# Patient Record
Sex: Female | Born: 1955 | Race: Black or African American | Hispanic: No | Marital: Married | Smoking: Former smoker
Health system: Southern US, Community
[De-identification: ages and names within clinical notes are randomized; demographics above are authoritative.]

## PROBLEM LIST (undated history)

## (undated) DIAGNOSIS — K7460 Unspecified cirrhosis of liver: Secondary | ICD-10-CM

## (undated) DIAGNOSIS — C801 Malignant (primary) neoplasm, unspecified: Secondary | ICD-10-CM

## (undated) DIAGNOSIS — E669 Obesity, unspecified: Secondary | ICD-10-CM

## (undated) DIAGNOSIS — K759 Inflammatory liver disease, unspecified: Secondary | ICD-10-CM

## (undated) DIAGNOSIS — I10 Essential (primary) hypertension: Secondary | ICD-10-CM

## (undated) DIAGNOSIS — K828 Other specified diseases of gallbladder: Secondary | ICD-10-CM

## (undated) DIAGNOSIS — K5790 Diverticulosis of intestine, part unspecified, without perforation or abscess without bleeding: Secondary | ICD-10-CM

## (undated) DIAGNOSIS — B1920 Unspecified viral hepatitis C without hepatic coma: Secondary | ICD-10-CM

## (undated) HISTORY — PX: OTHER SURGICAL HISTORY: SHX169

## (undated) HISTORY — DX: Other specified diseases of gallbladder: K82.8

## (undated) HISTORY — DX: Unspecified cirrhosis of liver: K74.60

## (undated) HISTORY — DX: Obesity, unspecified: E66.9

## (undated) HISTORY — DX: Diverticulosis of intestine, part unspecified, without perforation or abscess without bleeding: K57.90

## (undated) HISTORY — DX: Unspecified viral hepatitis C without hepatic coma: B19.20

---

## 2000-02-08 ENCOUNTER — Encounter: Payer: Self-pay | Admitting: Internal Medicine

## 2000-02-08 ENCOUNTER — Ambulatory Visit (HOSPITAL_COMMUNITY): Admission: RE | Admit: 2000-02-08 | Discharge: 2000-02-08 | Payer: Self-pay | Admitting: Internal Medicine

## 2001-09-16 ENCOUNTER — Encounter: Payer: Self-pay | Admitting: Family Medicine

## 2001-09-16 ENCOUNTER — Ambulatory Visit (HOSPITAL_COMMUNITY): Admission: RE | Admit: 2001-09-16 | Discharge: 2001-09-16 | Payer: Self-pay | Admitting: Family Medicine

## 2001-12-14 ENCOUNTER — Ambulatory Visit (HOSPITAL_COMMUNITY): Admission: RE | Admit: 2001-12-14 | Discharge: 2001-12-14 | Payer: Self-pay | Admitting: Family Medicine

## 2002-06-04 ENCOUNTER — Ambulatory Visit (HOSPITAL_COMMUNITY): Admission: RE | Admit: 2002-06-04 | Discharge: 2002-06-04 | Payer: Self-pay | Admitting: Family Medicine

## 2002-06-04 ENCOUNTER — Encounter: Payer: Self-pay | Admitting: Family Medicine

## 2003-06-09 ENCOUNTER — Ambulatory Visit (HOSPITAL_COMMUNITY): Admission: RE | Admit: 2003-06-09 | Discharge: 2003-06-09 | Payer: Self-pay | Admitting: Family Medicine

## 2003-06-27 ENCOUNTER — Encounter: Admission: RE | Admit: 2003-06-27 | Discharge: 2003-06-27 | Payer: Self-pay | Admitting: Family Medicine

## 2003-12-19 ENCOUNTER — Encounter: Admission: RE | Admit: 2003-12-19 | Discharge: 2003-12-19 | Payer: Self-pay | Admitting: Family Medicine

## 2004-06-28 ENCOUNTER — Ambulatory Visit: Payer: Self-pay | Admitting: Family Medicine

## 2004-06-28 ENCOUNTER — Ambulatory Visit: Payer: Self-pay | Admitting: *Deleted

## 2004-07-23 ENCOUNTER — Encounter: Admission: RE | Admit: 2004-07-23 | Discharge: 2004-07-23 | Payer: Self-pay | Admitting: Family Medicine

## 2004-08-02 ENCOUNTER — Ambulatory Visit: Payer: Self-pay | Admitting: Family Medicine

## 2004-08-08 ENCOUNTER — Ambulatory Visit: Payer: Self-pay | Admitting: Family Medicine

## 2004-11-13 ENCOUNTER — Ambulatory Visit: Payer: Self-pay | Admitting: Gastroenterology

## 2005-02-15 ENCOUNTER — Ambulatory Visit: Payer: Self-pay | Admitting: Family Medicine

## 2005-05-17 ENCOUNTER — Ambulatory Visit: Payer: Self-pay | Admitting: Family Medicine

## 2005-07-24 ENCOUNTER — Encounter: Admission: RE | Admit: 2005-07-24 | Discharge: 2005-07-24 | Payer: Self-pay | Admitting: Family Medicine

## 2005-12-04 ENCOUNTER — Ambulatory Visit: Payer: Self-pay | Admitting: Family Medicine

## 2005-12-17 ENCOUNTER — Ambulatory Visit: Payer: Self-pay | Admitting: Family Medicine

## 2006-01-03 ENCOUNTER — Ambulatory Visit: Payer: Self-pay | Admitting: Family Medicine

## 2006-03-11 ENCOUNTER — Ambulatory Visit: Payer: Self-pay | Admitting: Family Medicine

## 2006-05-08 ENCOUNTER — Ambulatory Visit: Payer: Self-pay | Admitting: Family Medicine

## 2006-05-09 ENCOUNTER — Ambulatory Visit (HOSPITAL_COMMUNITY): Admission: RE | Admit: 2006-05-09 | Discharge: 2006-05-09 | Payer: Self-pay | Admitting: Family Medicine

## 2006-05-09 IMAGING — CR DG CHEST 2V
2 series · 2 of 2 positions shown · non-contrast
Comparison: none

CLINICAL DATA: Chronic cough.  Smoker.  
 CHEST - 2 VIEW:

[view not recorded (1 of 2)]
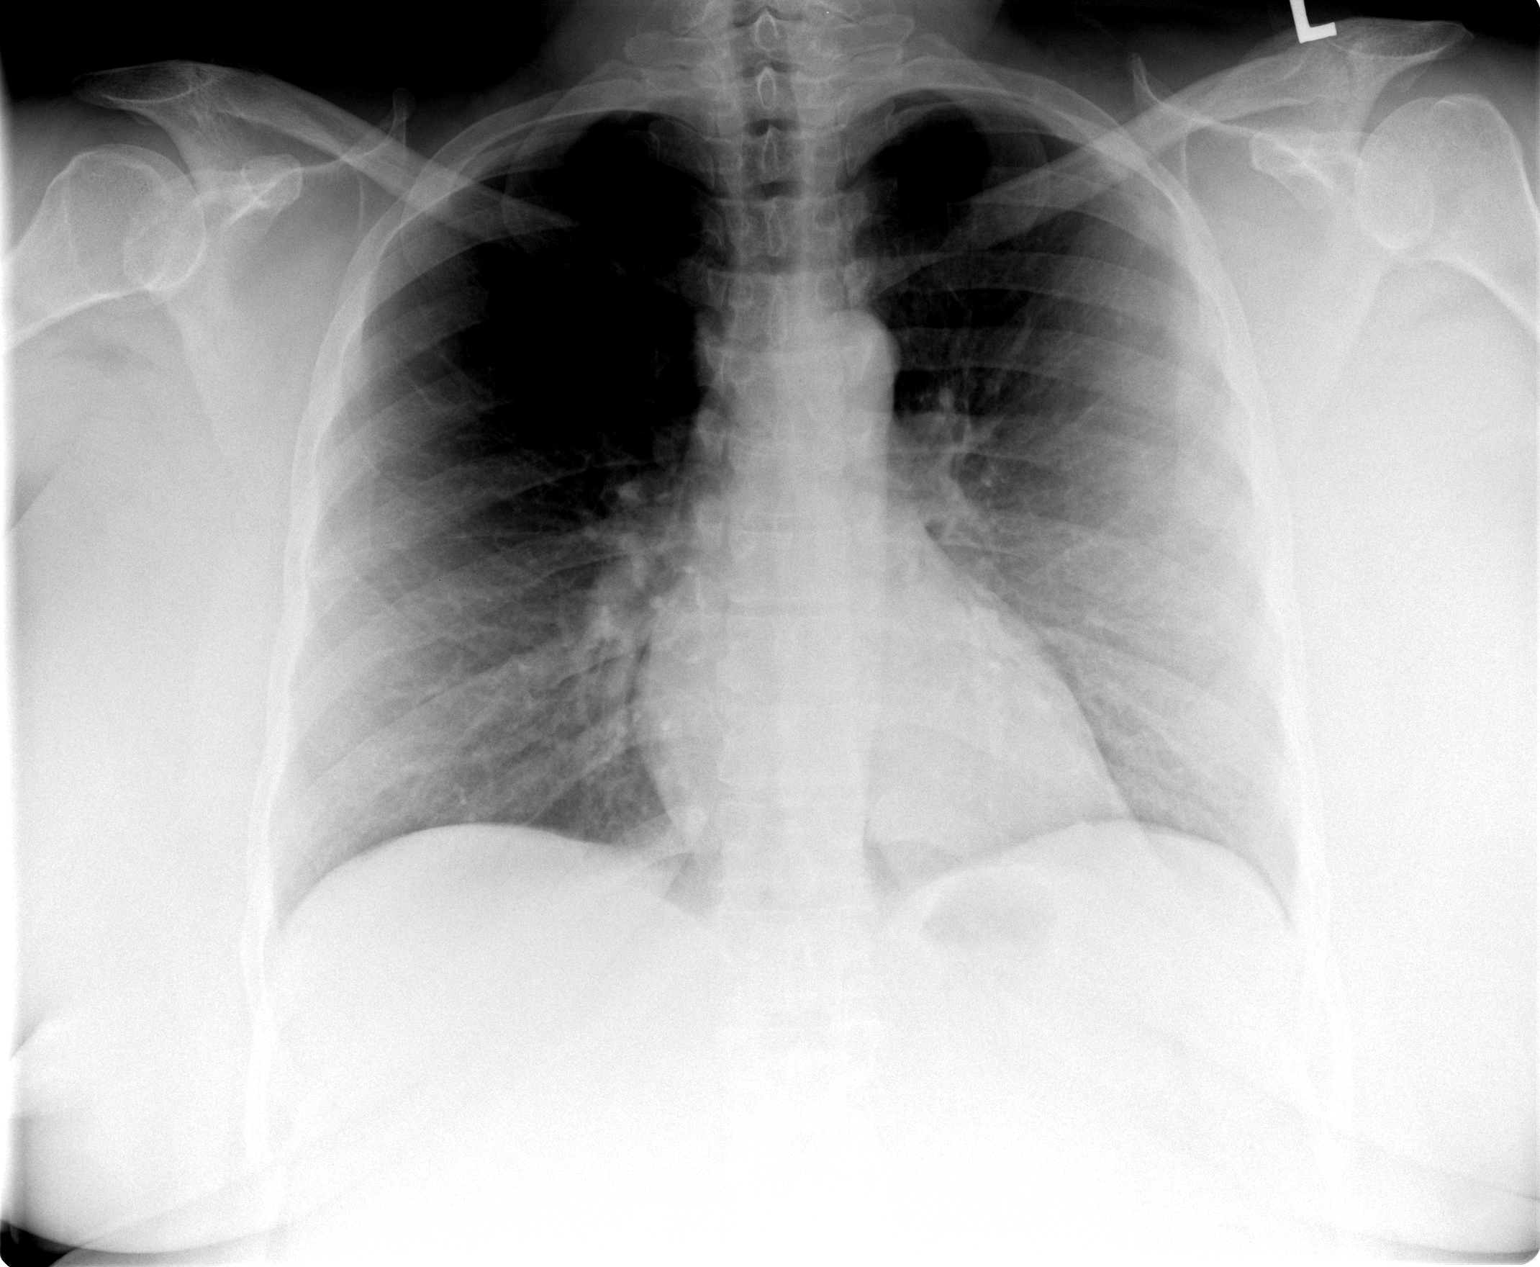

[view not recorded (2 of 2)]
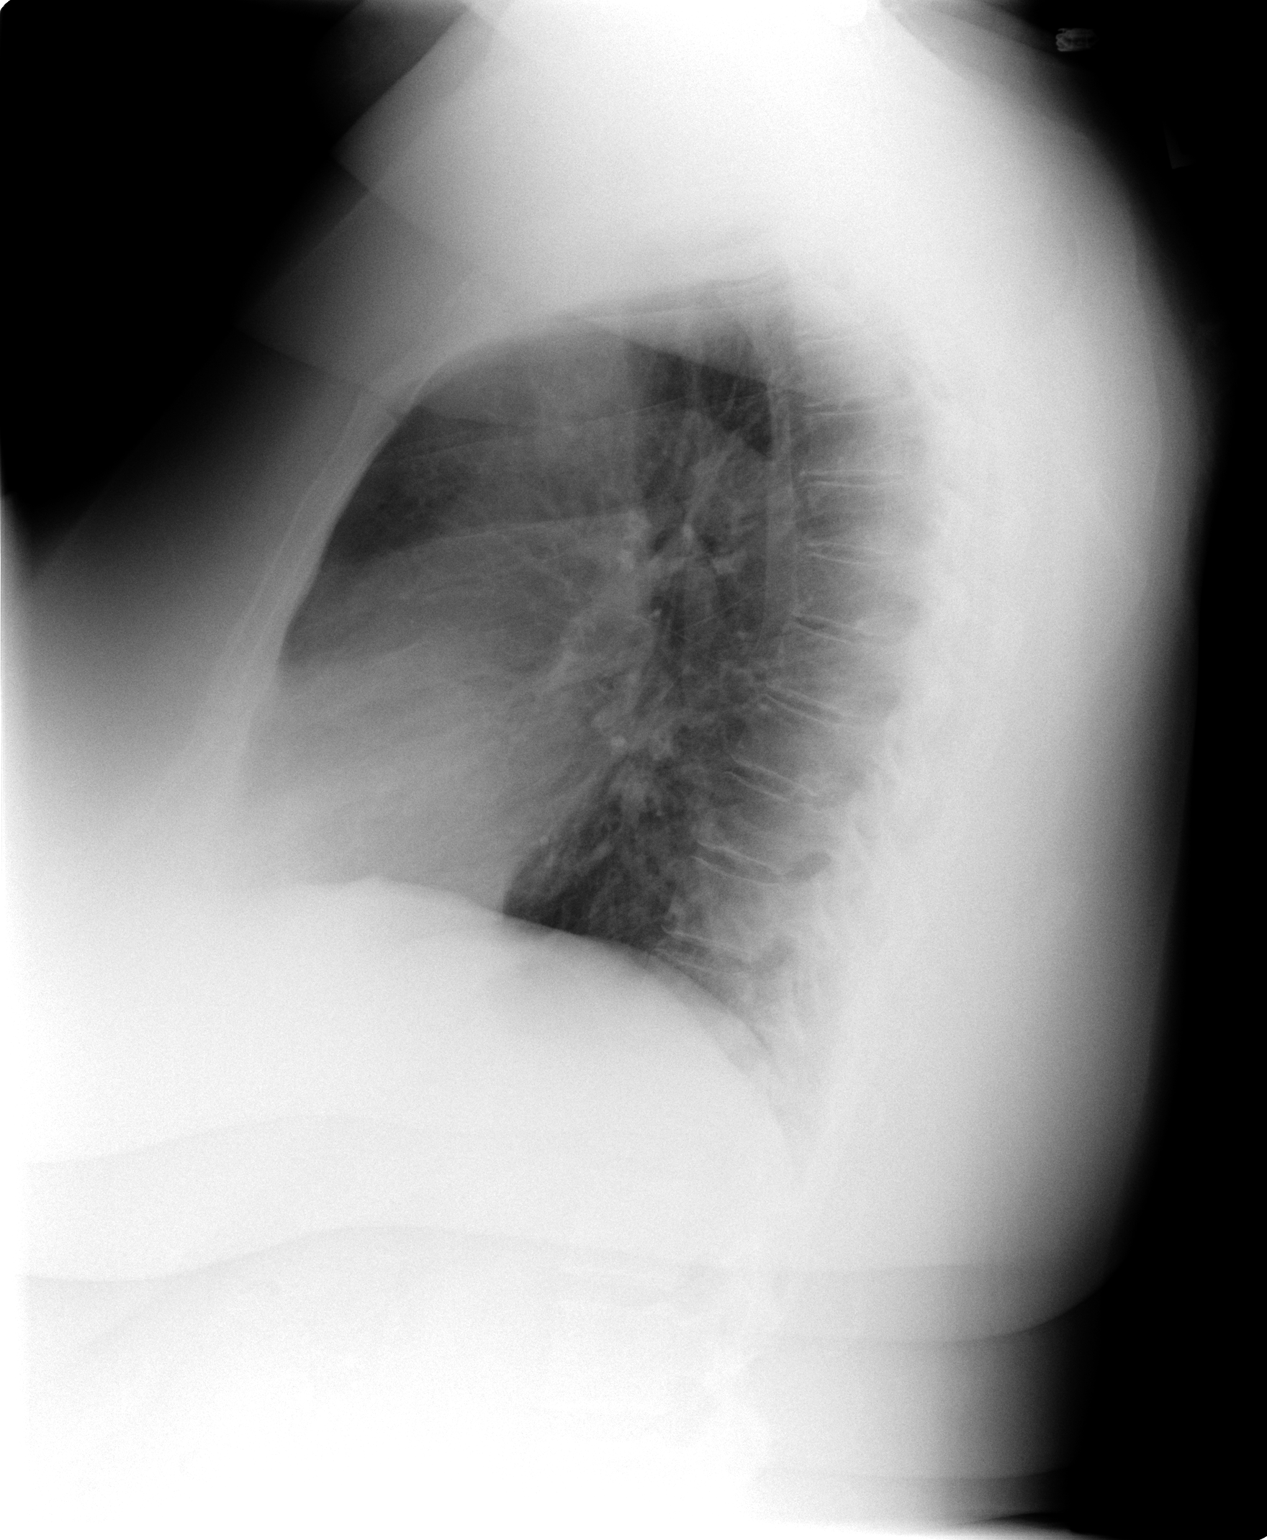

[2 of 2 positions shown; findings below may reference images not displayed]

FINDINGS: The heart size and mediastinal contours are within normal limits.  Both lungs are clear.  The visualized skeletal structures are unremarkable.
IMPRESSION: No active cardiopulmonary disease.

## 2006-07-25 ENCOUNTER — Encounter: Admission: RE | Admit: 2006-07-25 | Discharge: 2006-07-25 | Payer: Self-pay | Admitting: Family Medicine

## 2006-07-25 IMAGING — MG MM SCREEN MAMMOGRAM BILATERAL
5 series · 5 of 5 positions shown · non-contrast
Comparison: none

DG SCREEN MAMMOGRAM BILATERAL
Bilateral CC and MLO view(s) were taken.

DIGITAL SCREENING MAMMOGRAM WITH CAD:
There scattered fibroglandular densities.  Microcalcifications are present in the left breast.  
Characterization with magnification views is recommended.  No mass or malignant type calcifications
are identified in the right breast.  Compared with prior studies.

[R CC]
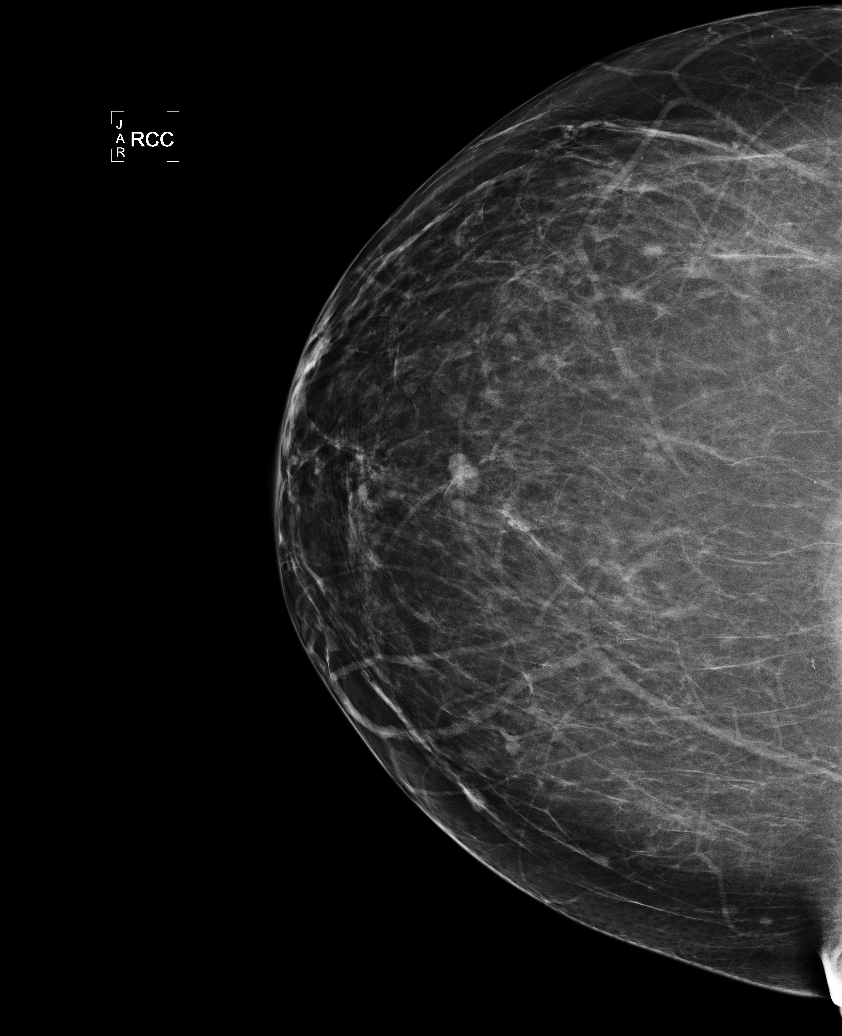

[L CC (1 of 2)]
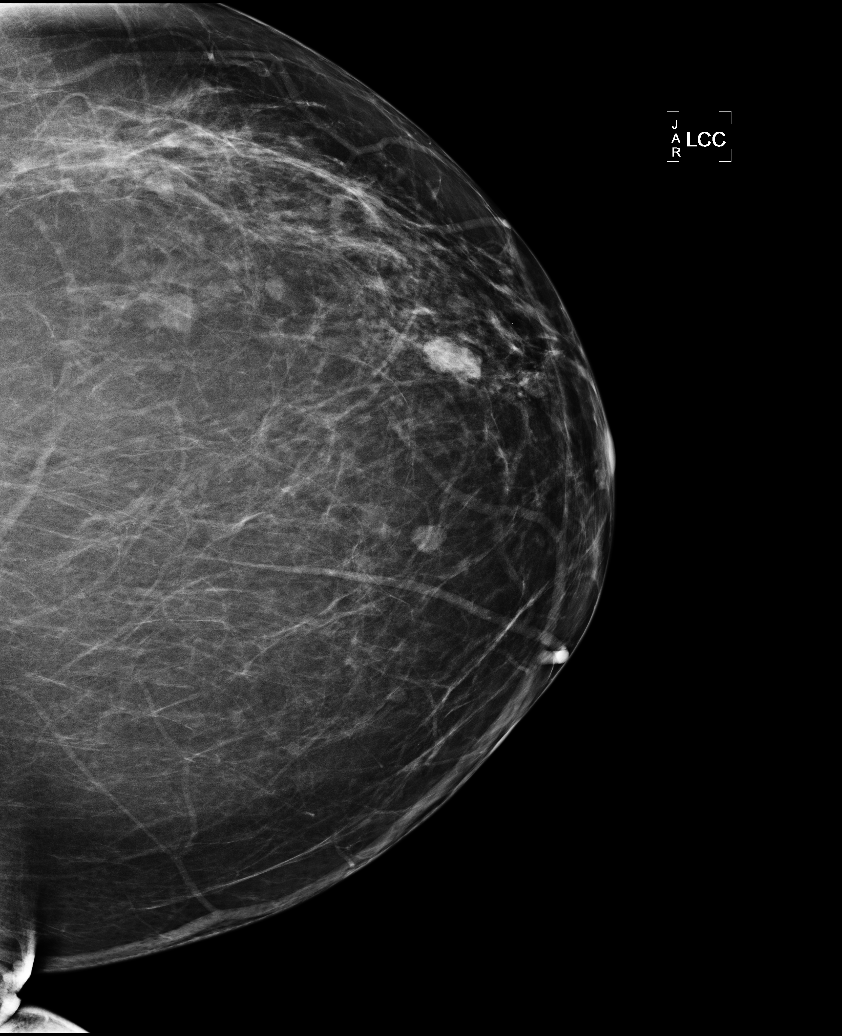

[L MLO]
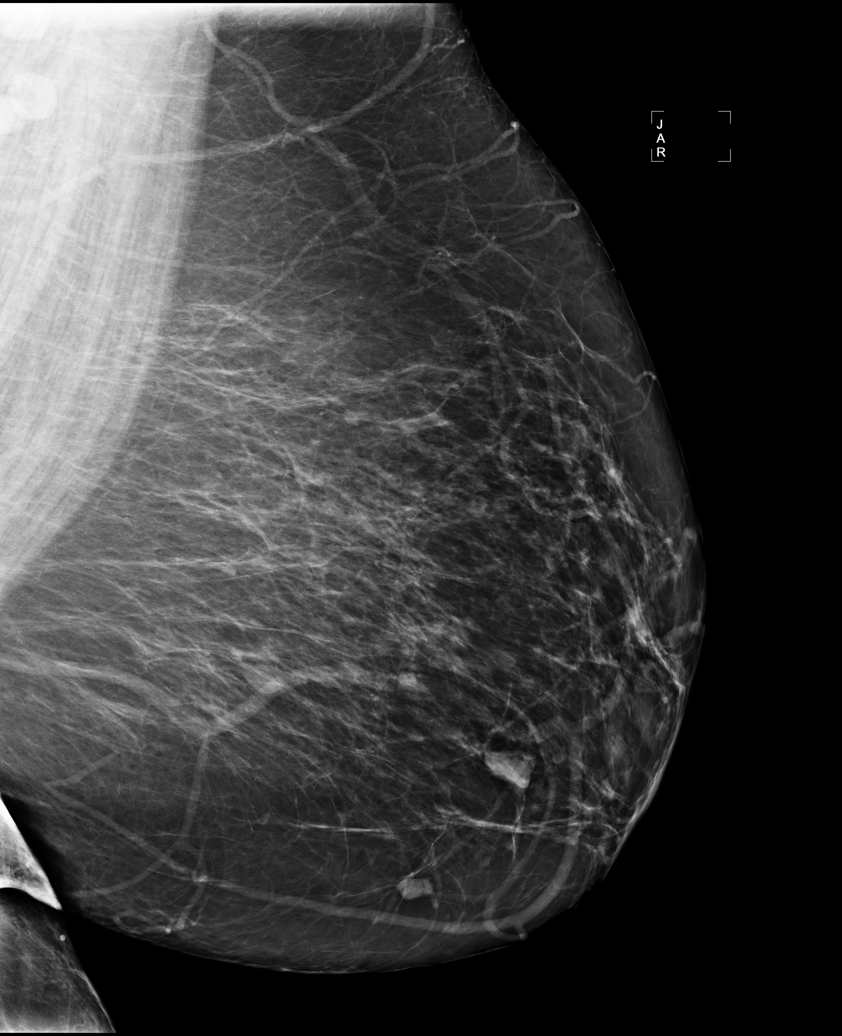

[R MLO]
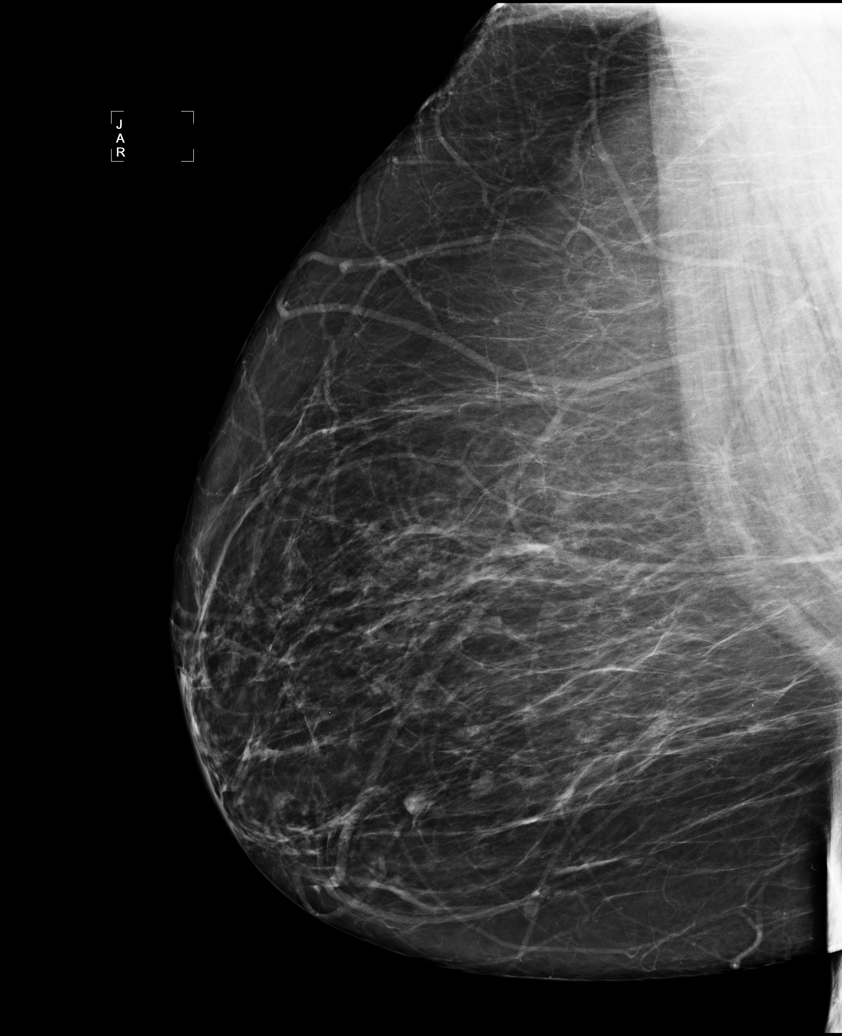

[L CC (2 of 2)]
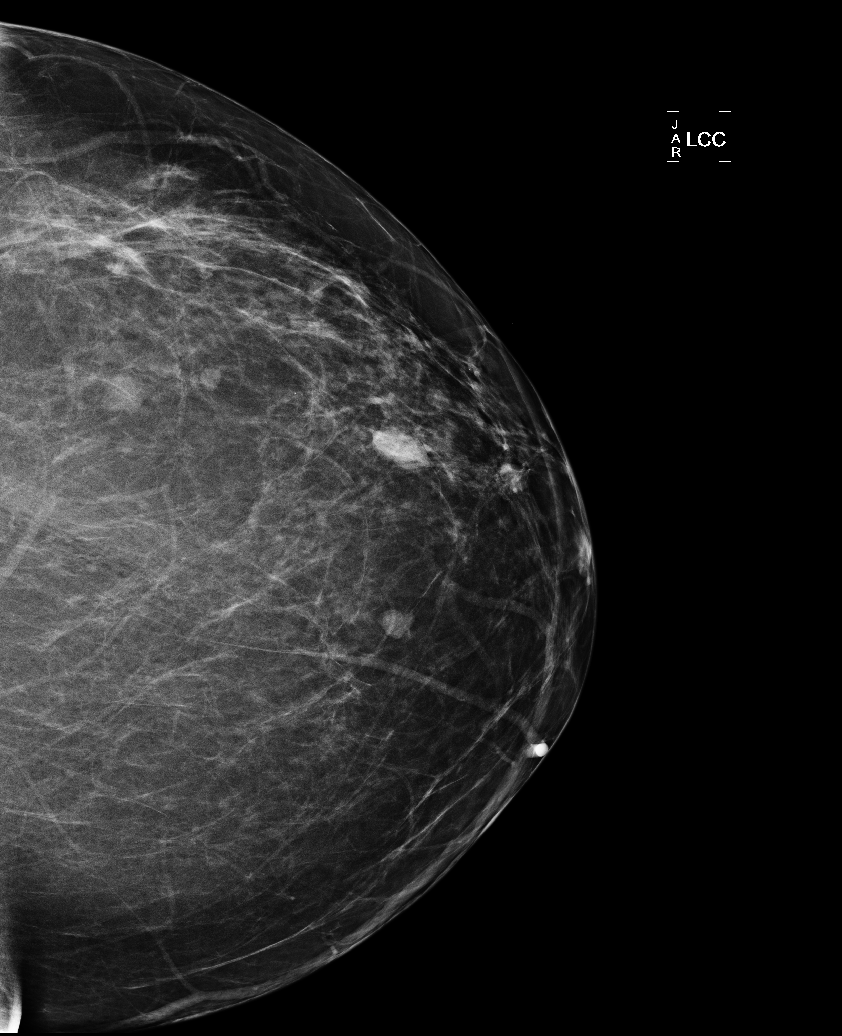

[5 of 5 positions shown; findings below may reference images not displayed]

IMPRESSION: Calcifications, left breast.  Additional evaluation is indicated. The patient will be contacted for
additional studies and a supplementary report will follow.  No specific mammographic evidence of 
malignancy, right breast.

ASSESSMENT: Need additional imaging evaluation and/or prior mammograms for comparison - BI-RADS 0

Further imaging of the left breast.
ANALYZED BY COMPUTER AIDED DETECTION. , THIS PROCEDURE WAS A DIGITAL MAMMOGRAM.

## 2006-08-05 ENCOUNTER — Encounter: Admission: RE | Admit: 2006-08-05 | Discharge: 2006-08-05 | Payer: Self-pay | Admitting: Family Medicine

## 2006-12-17 ENCOUNTER — Ambulatory Visit: Payer: Self-pay | Admitting: Family Medicine

## 2007-03-18 ENCOUNTER — Ambulatory Visit: Payer: Self-pay | Admitting: Family Medicine

## 2007-03-23 ENCOUNTER — Ambulatory Visit (HOSPITAL_COMMUNITY): Admission: RE | Admit: 2007-03-23 | Discharge: 2007-03-23 | Payer: Self-pay | Admitting: Family Medicine

## 2007-03-23 IMAGING — CR DG CERVICAL SPINE COMPLETE 4+V
6 series · 6 of 6 positions shown · non-contrast
Comparison: None available.

CLINICAL DATA: Neck pain. 
 CERVICAL SPINE ? 5 VIEW:

[w c-spine lat]
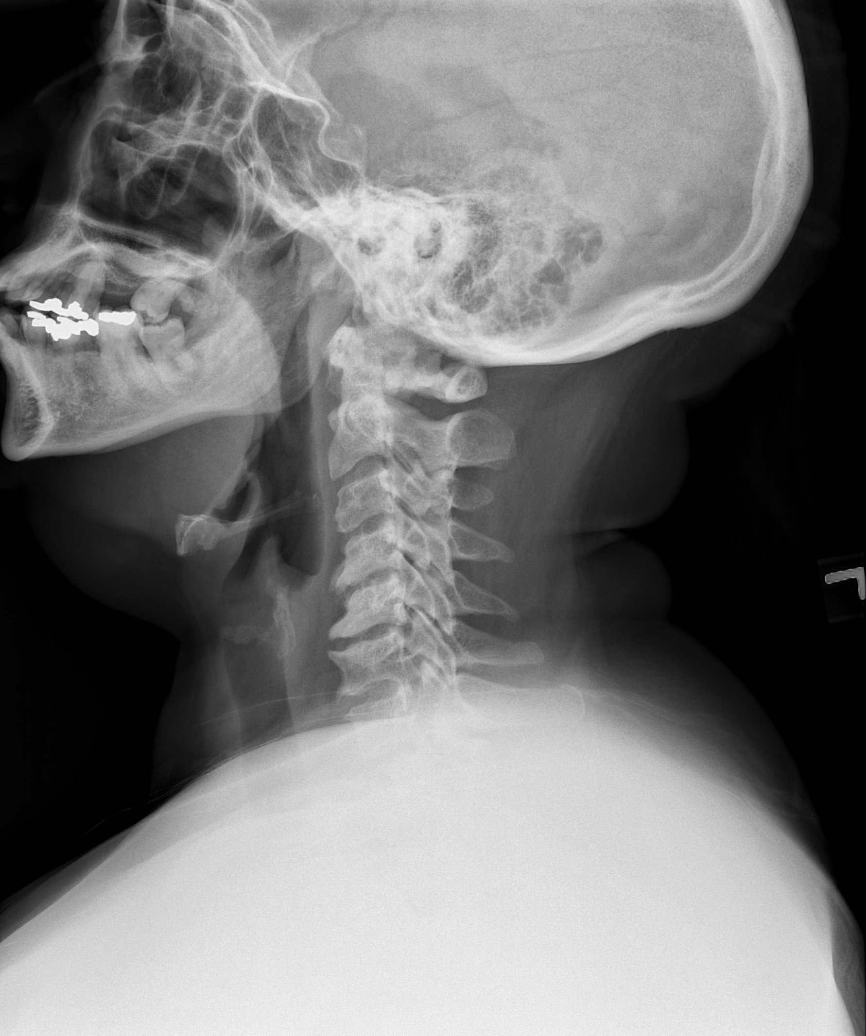

[w c-spine oblique (1 of 2)]
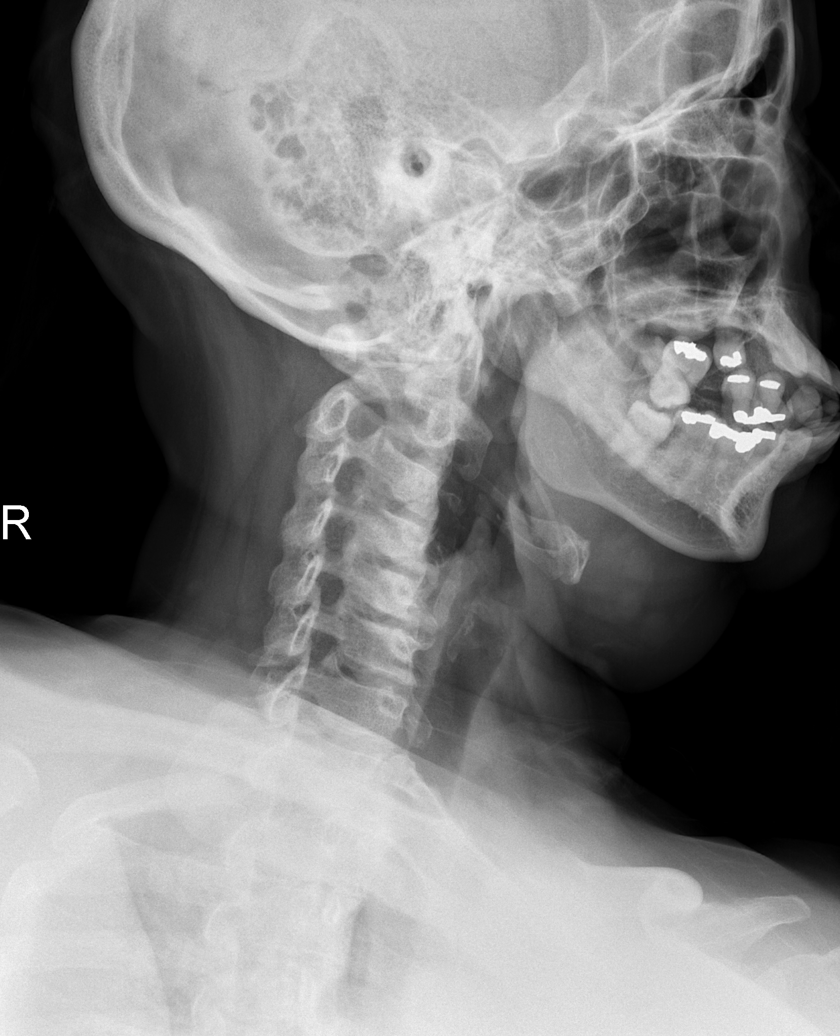

[w c-spine oblique (2 of 2)]
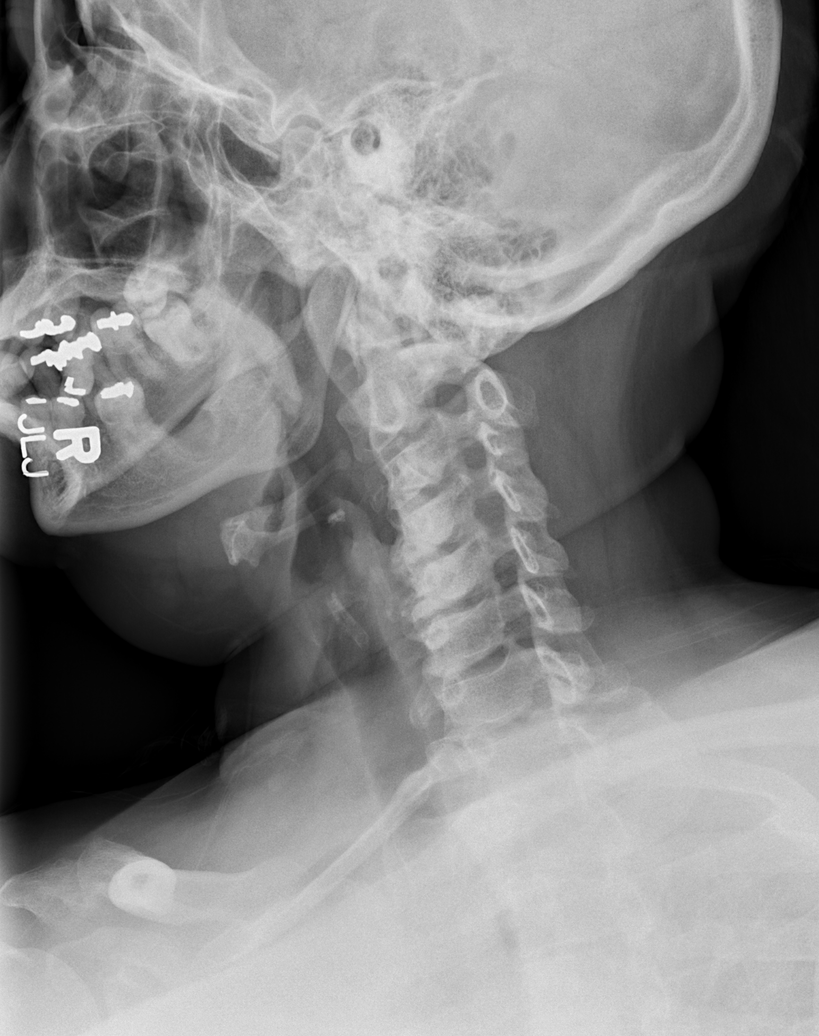

[w c-spine a.p.]
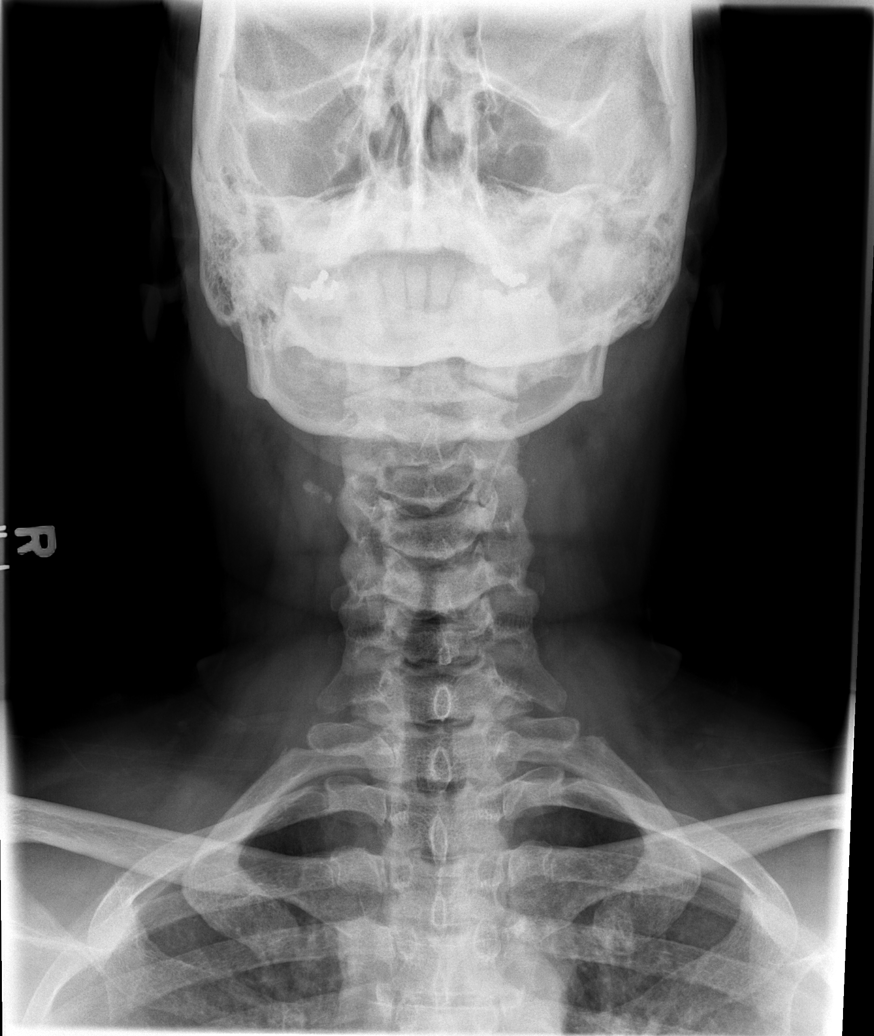

[w c-spine odontoid]
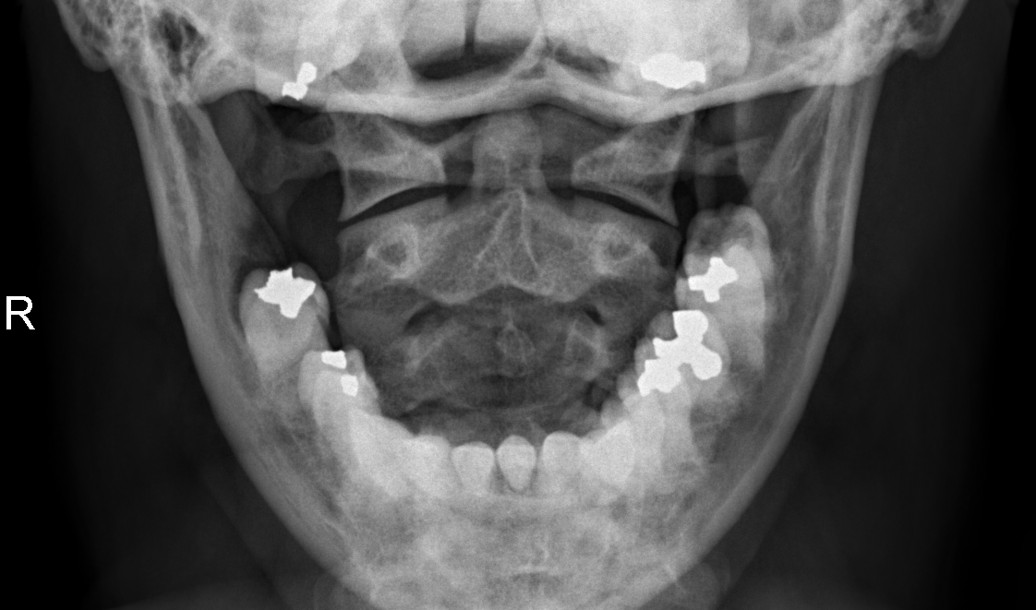

[w swimmers view *]
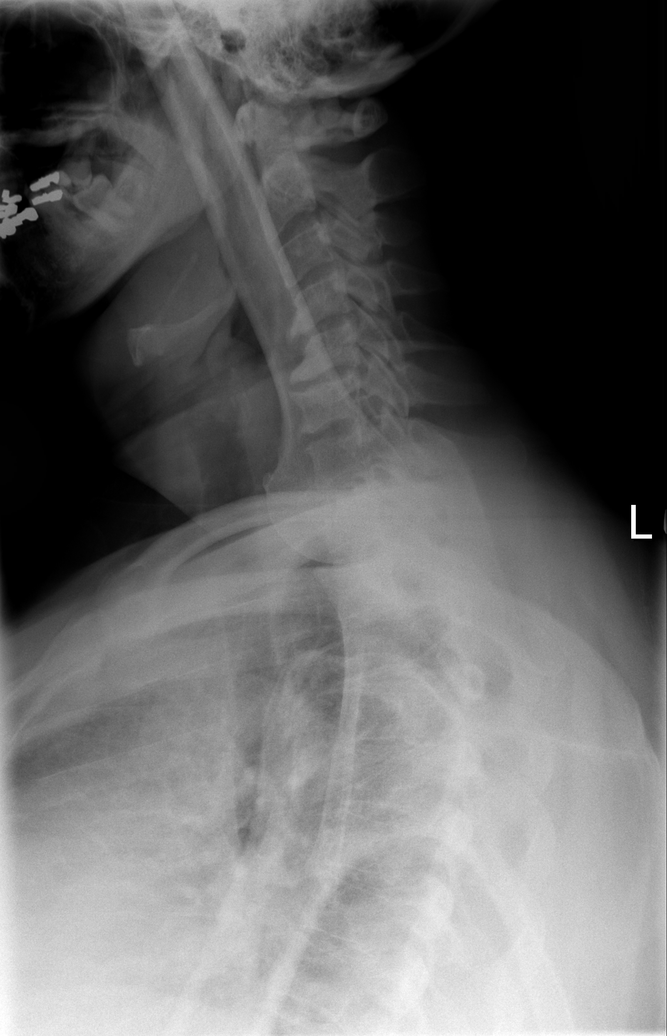

[6 of 6 positions shown; findings below may reference images not displayed]

FINDINGS: There is degenerative disc disease with spurring particularly at C4-5 and C5-6.  There is only mild narrowing of the neural foramina.  Soft tissue calcification noted in the right portion of the neck in the region of the carotid artery.  
 No acute abnormality.
IMPRESSION: Degenerative changes with prominent spurring at C4-5 and C5-6.

## 2007-03-23 IMAGING — CR DG SHOULDER 2+V BILAT
4 series · 4 of 4 positions shown · non-contrast
Comparison: None.

CLINICAL DATA: Paresthesias, right arm numbness.  Left arm soreness. 
 2 VIEW SHOULDER BILATERAL ? 4 VIEW TOTAL:

[w shoulder ap internal left]
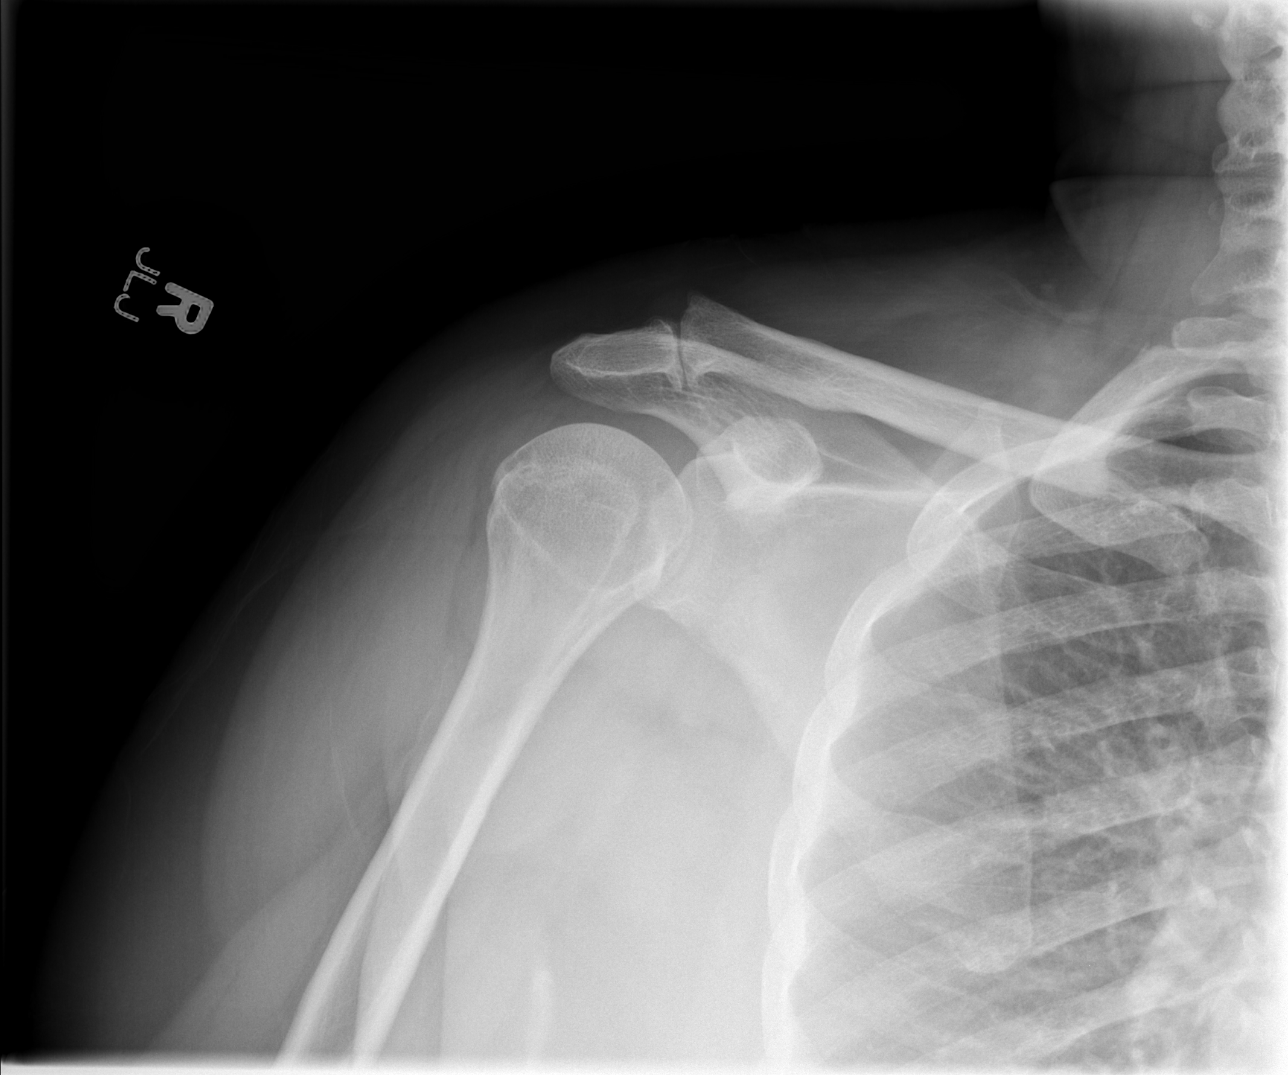

[w shoulder ap external left]
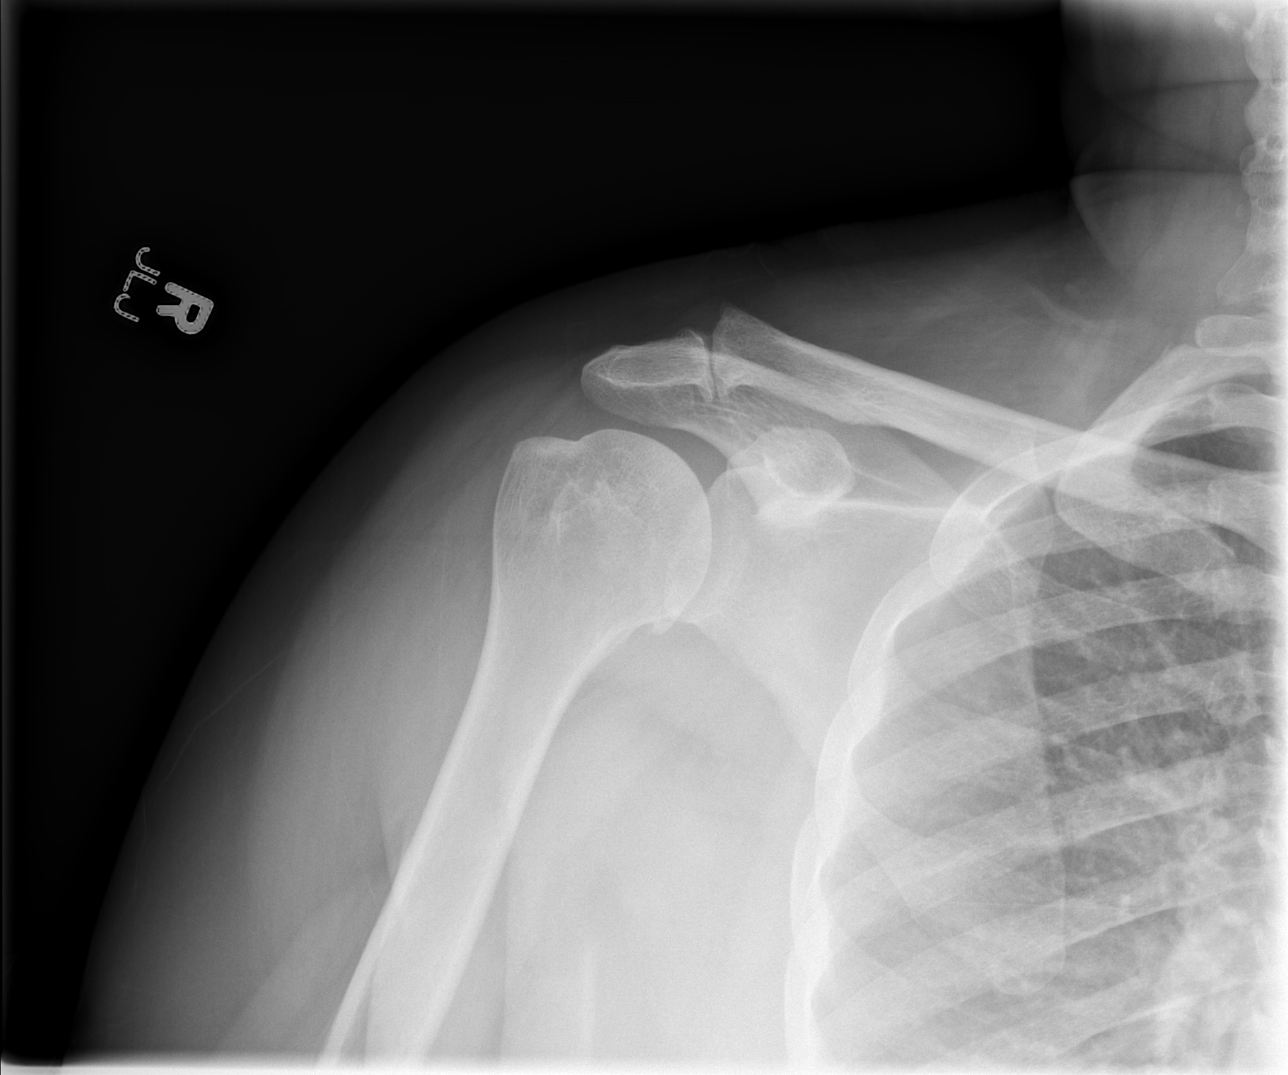

[w shoulder ap internal right]
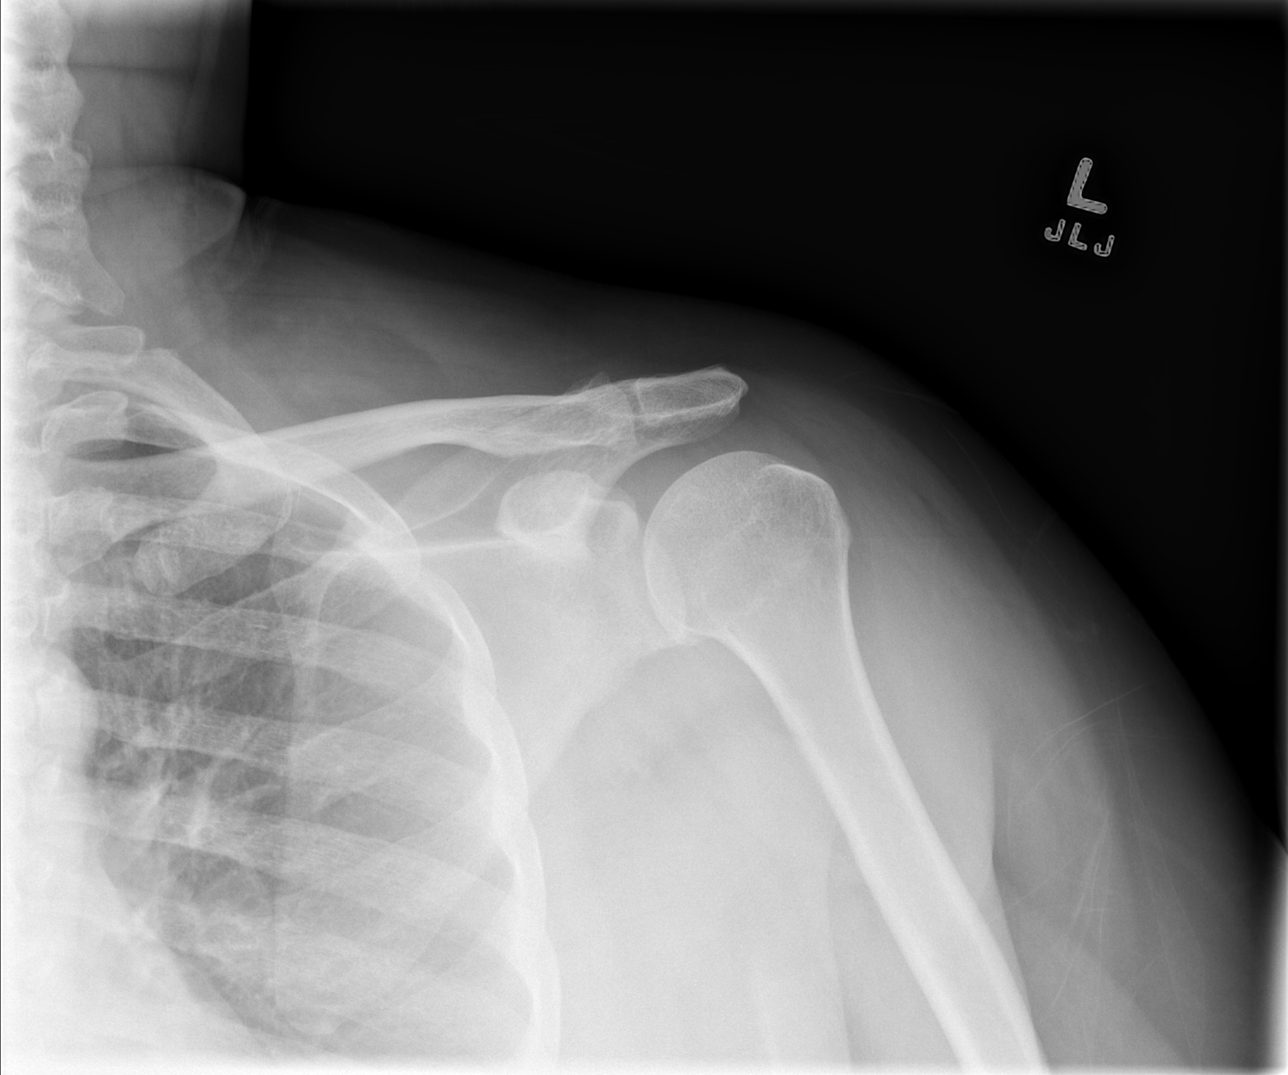

[w shoulder ap external right]
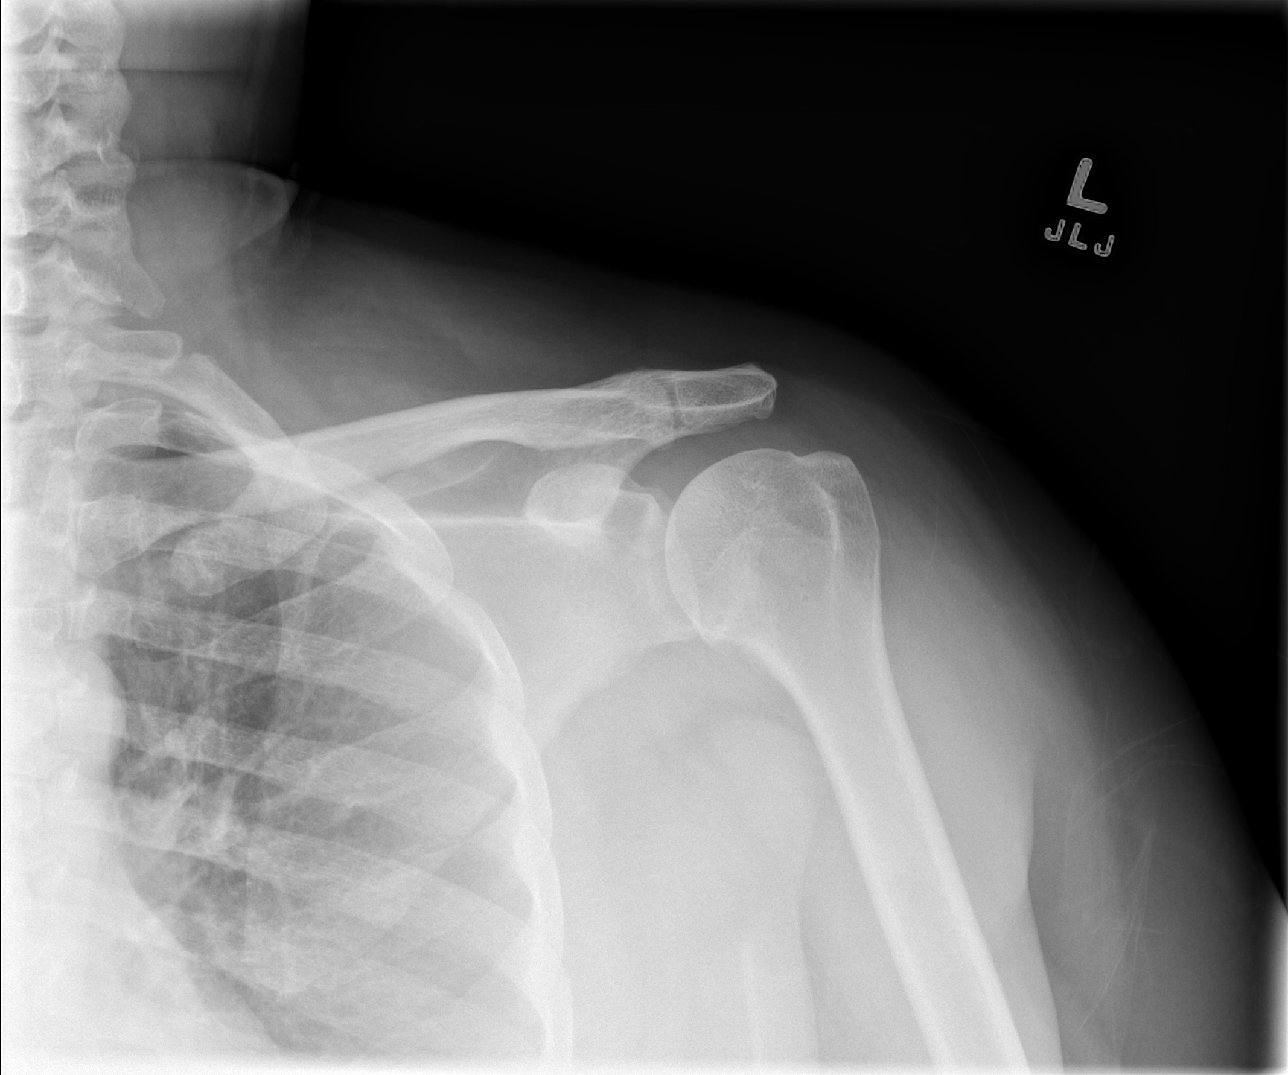

[4 of 4 positions shown; findings below may reference images not displayed]

FINDINGS: There are degenerative changes in the acromioclavicular joints bilaterally, right greater than left.  Particularly on the right, there is undersurface spurring.  Osteophytes are seen off of the humeral heads bilaterally.  Glenohumeral joint space appears maintained on these views.  Visualized lungs are clear.
IMPRESSION: 1.  Acromioclavicular joint osteoarthritis, right greater than left.  
 2.  Glenohumeral joint osteophytosis bilaterally.

## 2007-03-23 IMAGING — CR DG CHEST 2V
2 series · 2 of 2 positions shown · non-contrast
Comparison: [DATE].

CLINICAL DATA: 51-year-old female with paresthesias, hypertension, tobacco use.
CHEST ? 2 VIEW:

[w chest pa]
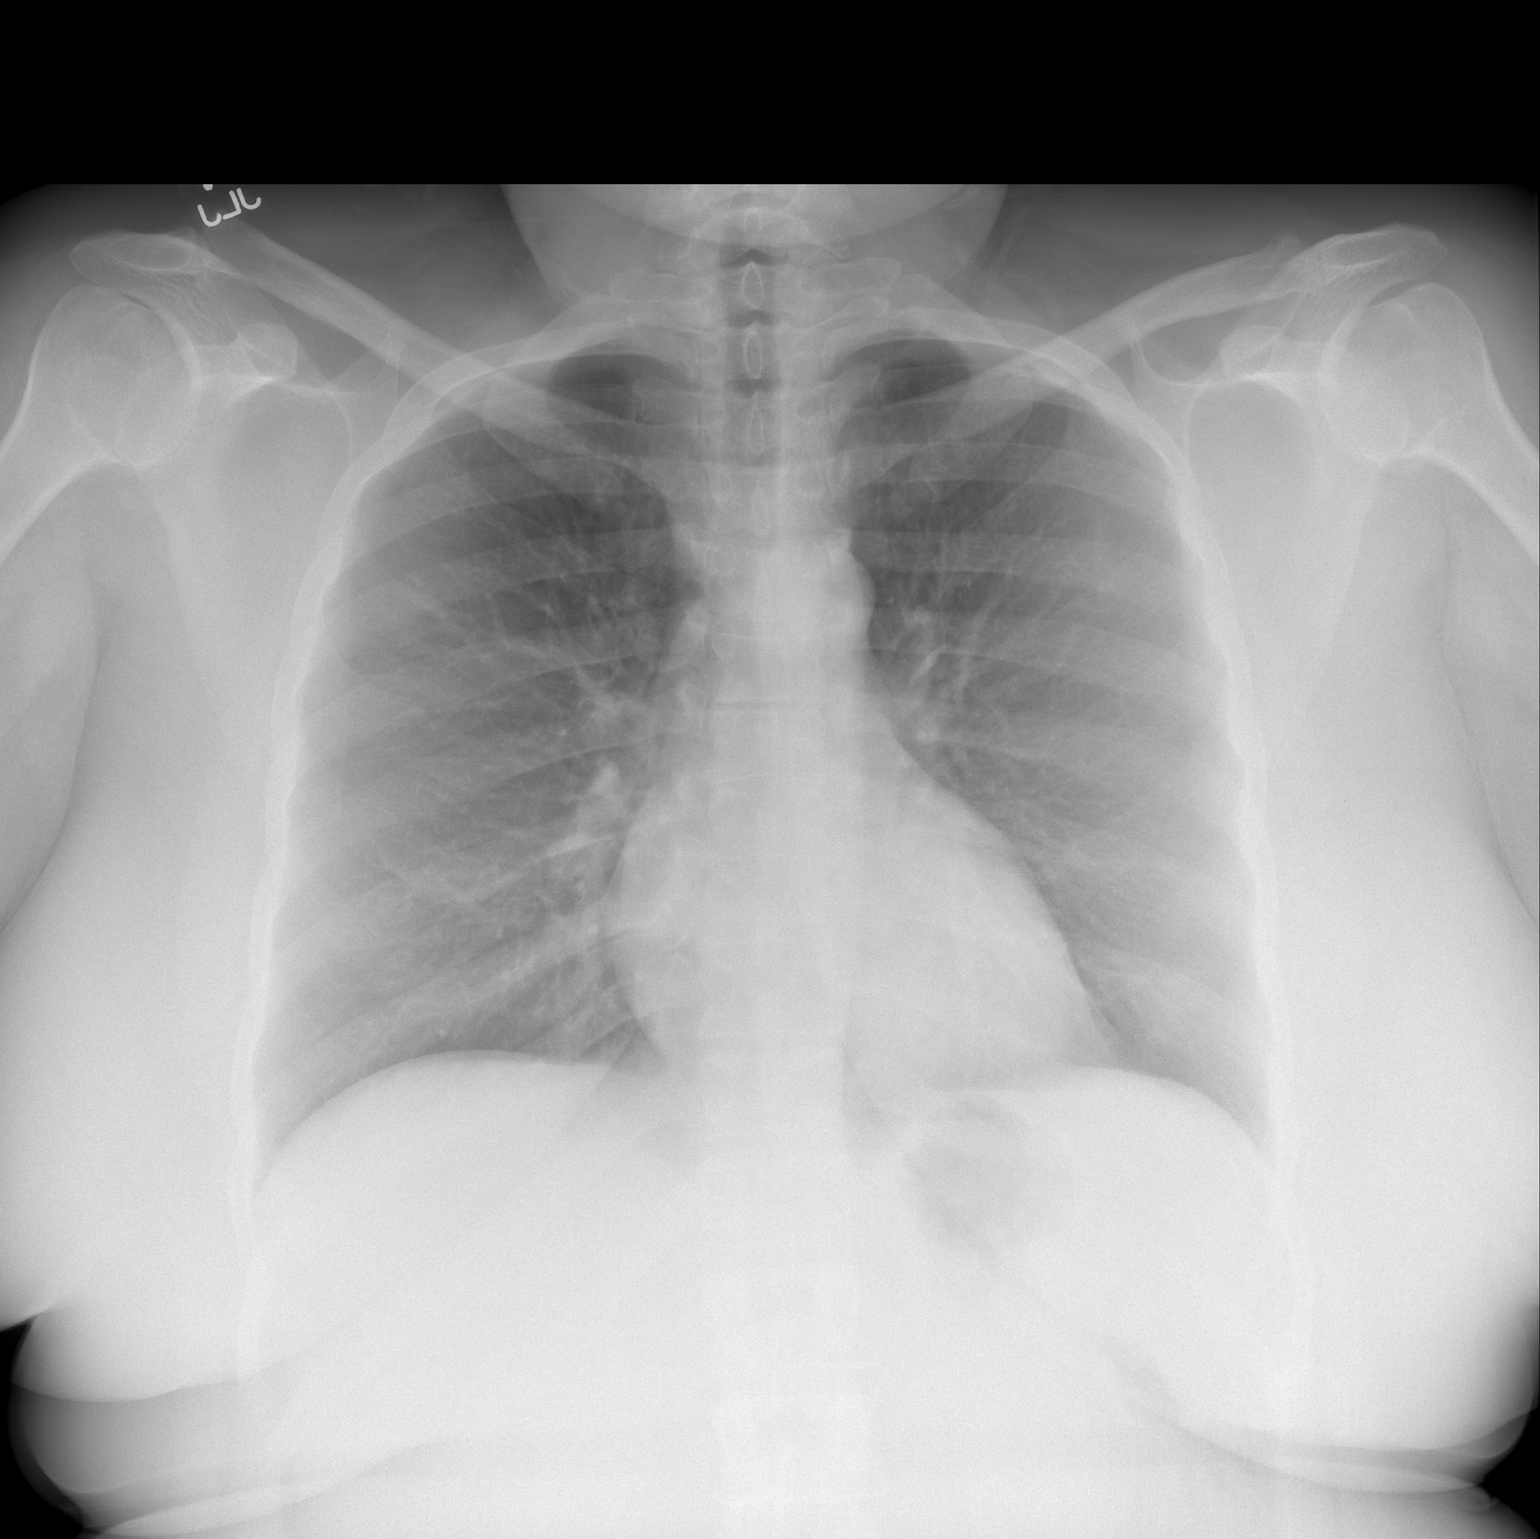

[w chest lat]
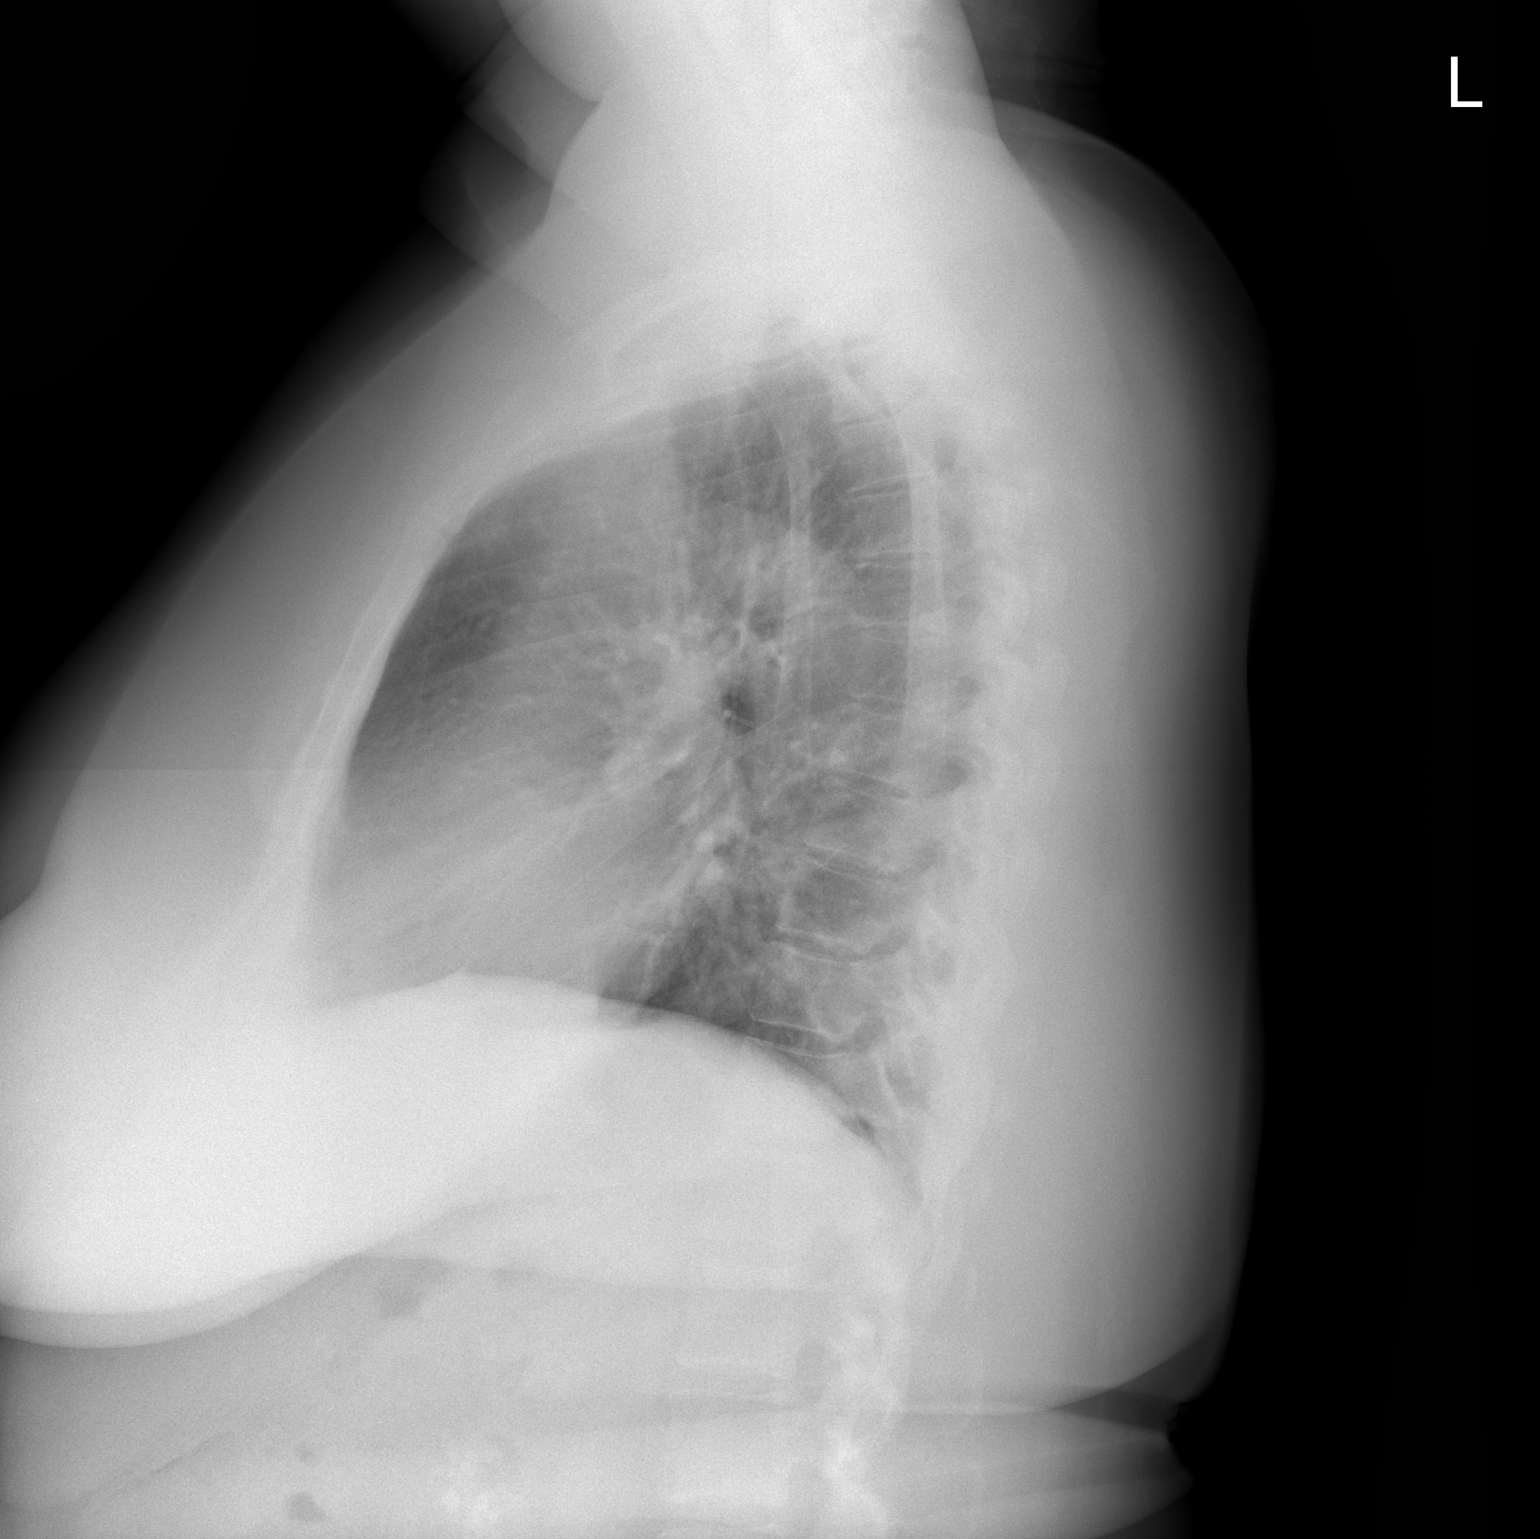

[2 of 2 positions shown; findings below may reference images not displayed]

FINDINGS: The heart size and mediastinal contours are within normal limits.  Both lungs are clear.  The visualized skeletal structures are unremarkable.
IMPRESSION: No active cardiopulmonary disease.

## 2007-05-06 ENCOUNTER — Encounter (INDEPENDENT_AMBULATORY_CARE_PROVIDER_SITE_OTHER): Payer: Self-pay | Admitting: *Deleted

## 2007-08-19 ENCOUNTER — Encounter: Admission: RE | Admit: 2007-08-19 | Discharge: 2007-08-19 | Payer: Self-pay | Admitting: Family Medicine

## 2007-10-15 ENCOUNTER — Ambulatory Visit: Payer: Self-pay | Admitting: Internal Medicine

## 2007-10-15 ENCOUNTER — Encounter (INDEPENDENT_AMBULATORY_CARE_PROVIDER_SITE_OTHER): Payer: Self-pay | Admitting: Family Medicine

## 2007-10-15 LAB — CONVERTED CEMR LAB
Chlamydia, DNA Probe: NEGATIVE
GC Probe Amp, Genital: NEGATIVE

## 2008-02-01 ENCOUNTER — Ambulatory Visit: Payer: Self-pay | Admitting: Internal Medicine

## 2008-02-03 ENCOUNTER — Ambulatory Visit: Payer: Self-pay | Admitting: Family Medicine

## 2008-08-08 ENCOUNTER — Ambulatory Visit: Payer: Self-pay | Admitting: Family Medicine

## 2008-08-08 LAB — CONVERTED CEMR LAB
ALT: 125 units/L — ABNORMAL HIGH (ref 0–35)
AST: 93 units/L — ABNORMAL HIGH (ref 0–37)
Albumin: 3.5 g/dL (ref 3.5–5.2)
Alkaline Phosphatase: 85 units/L (ref 39–117)
BUN: 13 mg/dL (ref 6–23)
CO2: 25 meq/L (ref 19–32)
Calcium: 8.6 mg/dL (ref 8.4–10.5)
Chloride: 99 meq/L (ref 96–112)
Creatinine, Ser: 0.63 mg/dL (ref 0.40–1.20)
Glucose, Bld: 158 mg/dL — ABNORMAL HIGH (ref 70–99)
Microalb, Ur: 3.53 mg/dL — ABNORMAL HIGH (ref 0.00–1.89)
Potassium: 3.9 meq/L (ref 3.5–5.3)
Sodium: 137 meq/L (ref 135–145)
T3, Total: 235.4 ng/dL — ABNORMAL HIGH (ref 80.0–204.0)
TSH: 2.732 microintl units/mL (ref 0.350–4.50)
Total Bilirubin: 0.6 mg/dL (ref 0.3–1.2)
Total Protein: 8 g/dL (ref 6.0–8.3)
Vit D, 1,25-Dihydroxy: 29 — ABNORMAL LOW (ref 30–89)

## 2008-08-19 DIAGNOSIS — K759 Inflammatory liver disease, unspecified: Secondary | ICD-10-CM

## 2008-08-19 HISTORY — DX: Inflammatory liver disease, unspecified: K75.9

## 2008-09-06 ENCOUNTER — Encounter: Admission: RE | Admit: 2008-09-06 | Discharge: 2008-09-06 | Payer: Self-pay | Admitting: Family Medicine

## 2009-01-24 ENCOUNTER — Ambulatory Visit: Payer: Self-pay | Admitting: Family Medicine

## 2009-01-26 ENCOUNTER — Ambulatory Visit: Payer: Self-pay | Admitting: Internal Medicine

## 2009-04-13 ENCOUNTER — Telehealth (INDEPENDENT_AMBULATORY_CARE_PROVIDER_SITE_OTHER): Payer: Self-pay | Admitting: *Deleted

## 2009-04-13 ENCOUNTER — Ambulatory Visit: Payer: Self-pay | Admitting: Internal Medicine

## 2009-08-19 DIAGNOSIS — I10 Essential (primary) hypertension: Secondary | ICD-10-CM

## 2009-08-19 HISTORY — DX: Essential (primary) hypertension: I10

## 2009-10-02 ENCOUNTER — Encounter: Admission: RE | Admit: 2009-10-02 | Discharge: 2009-10-02 | Payer: Self-pay | Admitting: Family Medicine

## 2010-01-01 ENCOUNTER — Ambulatory Visit: Payer: Self-pay | Admitting: Internal Medicine

## 2010-01-01 ENCOUNTER — Encounter (INDEPENDENT_AMBULATORY_CARE_PROVIDER_SITE_OTHER): Payer: Self-pay | Admitting: Adult Health

## 2010-01-01 LAB — CONVERTED CEMR LAB: Microalb, Ur: 0.54 mg/dL (ref 0.00–1.89)

## 2010-01-08 ENCOUNTER — Emergency Department (HOSPITAL_COMMUNITY): Admission: EM | Admit: 2010-01-08 | Discharge: 2010-01-08 | Payer: Self-pay | Admitting: Emergency Medicine

## 2010-03-19 ENCOUNTER — Ambulatory Visit: Payer: Self-pay | Admitting: Internal Medicine

## 2010-03-19 ENCOUNTER — Encounter (INDEPENDENT_AMBULATORY_CARE_PROVIDER_SITE_OTHER): Payer: Self-pay | Admitting: Adult Health

## 2010-03-19 LAB — CONVERTED CEMR LAB
ALT: 74 units/L — ABNORMAL HIGH (ref 0–35)
AST: 62 units/L — ABNORMAL HIGH (ref 0–37)
Albumin: 3.7 g/dL (ref 3.5–5.2)
Alkaline Phosphatase: 62 units/L (ref 39–117)
BUN: 14 mg/dL (ref 6–23)
CO2: 26 meq/L (ref 19–32)
Calcium: 8.7 mg/dL (ref 8.4–10.5)
Chloride: 96 meq/L (ref 96–112)
Cholesterol: 183 mg/dL (ref 0–200)
Creatinine, Ser: 0.67 mg/dL (ref 0.40–1.20)
Glucose, Bld: 182 mg/dL — ABNORMAL HIGH (ref 70–99)
HDL: 53 mg/dL (ref >39)
LDL Cholesterol: 109 mg/dL — ABNORMAL HIGH (ref 0–99)
Potassium: 3.3 meq/L — ABNORMAL LOW (ref 3.5–5.3)
Sodium: 138 meq/L (ref 135–145)
TSH: 1.959 microintl units/mL (ref 0.350–4.500)
Total Bilirubin: 0.5 mg/dL (ref 0.3–1.2)
Total CHOL/HDL Ratio: 3.5
Total Protein: 7.7 g/dL (ref 6.0–8.3)
Triglycerides: 104 mg/dL (ref <150)
VLDL: 21 mg/dL (ref 0–40)
Vit D, 25-Hydroxy: 28 ng/mL — ABNORMAL LOW (ref 30–89)

## 2010-06-24 ENCOUNTER — Emergency Department (HOSPITAL_COMMUNITY)
Admission: EM | Admit: 2010-06-24 | Discharge: 2010-06-24 | Payer: Self-pay | Source: Home / Self Care | Admitting: Emergency Medicine

## 2010-07-02 ENCOUNTER — Encounter (INDEPENDENT_AMBULATORY_CARE_PROVIDER_SITE_OTHER): Payer: Self-pay | Admitting: *Deleted

## 2010-07-02 LAB — CONVERTED CEMR LAB
BUN: 11 mg/dL (ref 6–23)
CO2: 28 meq/L (ref 19–32)
Calcium: 8.9 mg/dL (ref 8.4–10.5)
Chloride: 102 meq/L (ref 96–112)
Creatinine, Ser: 0.64 mg/dL (ref 0.40–1.20)
Glucose, Bld: 89 mg/dL (ref 70–99)
Potassium: 4.3 meq/L (ref 3.5–5.3)
Sodium: 140 meq/L (ref 135–145)

## 2010-09-08 ENCOUNTER — Encounter: Payer: Self-pay | Admitting: Family Medicine

## 2010-09-08 ENCOUNTER — Other Ambulatory Visit: Payer: Self-pay | Admitting: Family Medicine

## 2010-09-08 DIAGNOSIS — Z1231 Encounter for screening mammogram for malignant neoplasm of breast: Secondary | ICD-10-CM

## 2010-09-08 DIAGNOSIS — Z1239 Encounter for other screening for malignant neoplasm of breast: Secondary | ICD-10-CM

## 2010-09-09 ENCOUNTER — Encounter: Payer: Self-pay | Admitting: Gastroenterology

## 2010-09-09 ENCOUNTER — Encounter: Payer: Self-pay | Admitting: Family Medicine

## 2010-10-08 ENCOUNTER — Ambulatory Visit: Payer: Self-pay

## 2010-10-22 ENCOUNTER — Ambulatory Visit: Payer: Self-pay

## 2010-11-26 ENCOUNTER — Ambulatory Visit: Payer: Self-pay

## 2010-12-03 ENCOUNTER — Ambulatory Visit
Admission: RE | Admit: 2010-12-03 | Discharge: 2010-12-03 | Disposition: A | Discharge status: Home / Self Care | Payer: Self-pay | Source: Ambulatory Visit | Attending: *Deleted | Admitting: *Deleted

## 2010-12-03 ENCOUNTER — Other Ambulatory Visit: Payer: Self-pay | Admitting: Family Medicine

## 2010-12-03 DIAGNOSIS — Z1231 Encounter for screening mammogram for malignant neoplasm of breast: Secondary | ICD-10-CM

## 2011-07-24 ENCOUNTER — Encounter (HOSPITAL_COMMUNITY)
Admission: RE | Disposition: A | Discharge status: Home / Self Care | Payer: Self-pay | Source: Ambulatory Visit | Attending: Gastroenterology

## 2011-07-24 ENCOUNTER — Other Ambulatory Visit: Payer: Self-pay | Admitting: Gastroenterology

## 2011-07-24 ENCOUNTER — Ambulatory Visit (HOSPITAL_COMMUNITY)
Admission: RE | Admit: 2011-07-24 | Discharge: 2011-07-24 | Disposition: A | Discharge status: Home / Self Care | Payer: Self-pay | Source: Ambulatory Visit | Attending: Gastroenterology | Admitting: Gastroenterology

## 2011-07-24 ENCOUNTER — Encounter (HOSPITAL_COMMUNITY): Payer: Self-pay | Admitting: *Deleted

## 2011-07-24 DIAGNOSIS — E119 Type 2 diabetes mellitus without complications: Secondary | ICD-10-CM | POA: Insufficient documentation

## 2011-07-24 DIAGNOSIS — D126 Benign neoplasm of colon, unspecified: Secondary | ICD-10-CM | POA: Insufficient documentation

## 2011-07-24 DIAGNOSIS — I10 Essential (primary) hypertension: Secondary | ICD-10-CM | POA: Insufficient documentation

## 2011-07-24 DIAGNOSIS — Z79899 Other long term (current) drug therapy: Secondary | ICD-10-CM | POA: Insufficient documentation

## 2011-07-24 DIAGNOSIS — K6289 Other specified diseases of anus and rectum: Secondary | ICD-10-CM | POA: Insufficient documentation

## 2011-07-24 DIAGNOSIS — Z1211 Encounter for screening for malignant neoplasm of colon: Secondary | ICD-10-CM | POA: Insufficient documentation

## 2011-07-24 HISTORY — DX: Inflammatory liver disease, unspecified: K75.9

## 2011-07-24 HISTORY — DX: Essential (primary) hypertension: I10

## 2011-07-24 HISTORY — PX: COLONOSCOPY: SHX5424

## 2011-07-24 SURGERY — COLONOSCOPY
Anesthesia: Moderate Sedation

## 2011-07-24 MED ORDER — MIDAZOLAM HCL 5 MG/5ML IJ SOLN
INTRAMUSCULAR | Status: DC | PRN
  Administered 2011-07-24 (×2): 1 mg via INTRAVENOUS
  Administered 2011-07-24: 2 mg via INTRAVENOUS
  Administered 2011-07-24: 1 mg via INTRAVENOUS
  Administered 2011-07-24: 2 mg via INTRAVENOUS
  Administered 2011-07-24: 1 mg via INTRAVENOUS

## 2011-07-24 MED ORDER — SODIUM CHLORIDE 0.9 % IV SOLN
Freq: Once | INTRAVENOUS | Status: AC
  Administered 2011-07-24: 500 mL via INTRAVENOUS

## 2011-07-24 MED ORDER — FENTANYL CITRATE 0.05 MG/ML IJ SOLN
INTRAMUSCULAR | Status: AC
  Filled 2011-07-24: qty 4

## 2011-07-24 MED ORDER — DIPHENHYDRAMINE HCL 50 MG/ML IJ SOLN
INTRAMUSCULAR | Status: DC | PRN
  Administered 2011-07-24: 25 mg via INTRAVENOUS

## 2011-07-24 MED ORDER — FENTANYL NICU IV SYRINGE 50 MCG/ML
INJECTION | INTRAMUSCULAR | Status: DC | PRN
  Administered 2011-07-24 (×5): 25 ug via INTRAVENOUS

## 2011-07-24 MED ORDER — DIPHENHYDRAMINE HCL 50 MG/ML IJ SOLN
INTRAMUSCULAR | Status: AC
  Filled 2011-07-24: qty 1

## 2011-07-24 MED ORDER — MIDAZOLAM HCL 10 MG/2ML IJ SOLN
INTRAMUSCULAR | Status: AC
  Filled 2011-07-24: qty 4

## 2011-07-24 NOTE — Brief Op Note (Signed)
Please see [facility] note dated 07/24/2011. 

## 2011-07-24 NOTE — H&P (Signed)
Patient interval history reviewed.  Patient examined again.  There has been no change from documented H/P dated 07/01/2011 (scanned into chart from our office) except as documented above.

## 2011-07-24 NOTE — Op Note (Signed)
[facility] [address] [place], [place]  [address]  COLONOSCOPY PROCEDURE REPORT  PATIENT:  Melanie, Alvarez  MR#:  [id number] BIRTHDATE:  1955-10-03, 55 yrs. old  GENDER:  female ENDOSCOPIST:  Willis Modena, MD REF. BY:  Norberto Sorenson, M.D.  PROCEDURE DATE:  07/24/2011 PROCEDURE:  Colonoscopy with biopsy ASA CLASS:  Class II INDICATIONS:  screening (average-risk) MEDICATIONS:   Benadryl 25 mg IV, Fentanyl 125 mcg IV, Versed 8 mg IV DESCRIPTION OF PROCEDURE:   After the risks benefits and alternatives of the procedure were thoroughly explained, informed consent was obtained.  Digital rectal exam was performed and revealed no abnormalities.   The Pentax [id number] Enteroscopy endoscope was introduced through the anus and advanced to the cecum, which was identified by both the appendix and ileocecal valve, without limitations.  The quality of the prep was fair.. The instrument was then slowly withdrawn as the colon was fully examined.  <<PROCEDUREIMAGES>>  FINDINGS:  Prep quality  was fair; viscous stool obscured some views throughout the colon; diminutive lesions could easily have been missed.  4mm sigmoid colon polyp, removed with cold biopsy forceps.  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 Rare sigmoid colon diverticula.  No other polyps, masses, vascular ectasias, or inflammatory  changes were seen.  Normal retroflexed view of rectum.  ENDOSCOPIC IMPRESSION:    1.  Diminutive colon polyp, removed. 2.  Diminutive rectal submucosal-appearing nodule, worrisome for carcinoid, biopsied. 3.  Otherwise normal colonoscopy, exam limited by fair prep.  RECOMMENDATIONS:      1.  Watch for potential complications of procedure. 2.  Rare sigmoid diverticulosis. 3.  Await biopsy  results. 4.  Likely repeat colonoscopy in 5 years, pending biopsy results.  REPEAT EXAM:  Likely 5  years.  ______________________________ Willis Modena  CC:  n. eSIGNEDWillis Modena at 07/24/2011 10:20 AM  Raj Janus, [id number]

## 2011-07-25 ENCOUNTER — Encounter (HOSPITAL_COMMUNITY): Payer: Self-pay

## 2011-07-25 ENCOUNTER — Encounter (HOSPITAL_COMMUNITY): Payer: Self-pay | Admitting: Gastroenterology

## 2011-10-29 ENCOUNTER — Other Ambulatory Visit: Payer: Self-pay | Admitting: Family Medicine

## 2011-10-29 DIAGNOSIS — Z1231 Encounter for screening mammogram for malignant neoplasm of breast: Secondary | ICD-10-CM

## 2011-12-05 ENCOUNTER — Ambulatory Visit: Payer: Self-pay

## 2011-12-13 ENCOUNTER — Ambulatory Visit: Payer: Self-pay

## 2011-12-24 ENCOUNTER — Other Ambulatory Visit (HOSPITAL_COMMUNITY): Payer: Self-pay | Admitting: Family Medicine

## 2011-12-24 DIAGNOSIS — B1920 Unspecified viral hepatitis C without hepatic coma: Secondary | ICD-10-CM

## 2012-01-27 ENCOUNTER — Other Ambulatory Visit (HOSPITAL_COMMUNITY): Payer: Self-pay

## 2012-01-27 ENCOUNTER — Ambulatory Visit: Payer: Self-pay

## 2012-01-28 ENCOUNTER — Ambulatory Visit: Payer: Self-pay

## 2012-01-29 ENCOUNTER — Other Ambulatory Visit (HOSPITAL_COMMUNITY): Payer: Self-pay

## 2012-02-03 ENCOUNTER — Ambulatory Visit: Payer: Self-pay

## 2012-02-05 ENCOUNTER — Ambulatory Visit (HOSPITAL_COMMUNITY)
Admission: RE | Admit: 2012-02-05 | Discharge: 2012-02-05 | Disposition: A | Discharge status: Home / Self Care | Payer: Self-pay | Source: Ambulatory Visit | Attending: Family Medicine | Admitting: Family Medicine

## 2012-02-05 DIAGNOSIS — R7989 Other specified abnormal findings of blood chemistry: Secondary | ICD-10-CM

## 2012-02-05 DIAGNOSIS — R945 Abnormal results of liver function studies: Secondary | ICD-10-CM | POA: Insufficient documentation

## 2012-02-05 DIAGNOSIS — B1920 Unspecified viral hepatitis C without hepatic coma: Secondary | ICD-10-CM

## 2012-02-05 IMAGING — US US ABDOMEN COMPLETE
1 series · 13 of 25 positions shown · non-contrast
Comparison: [DATE] ultrasound.

CLINICAL DATA: History of hepatitis C.  History of elevated liver
function tests.  History of diabetes and hypertension.

ABDOMINAL ULTRASOUND COMPLETE

[Series 1: us abdomen complete · 0.30mm/px · 13 of 74 slices shown]
[im 1/74]
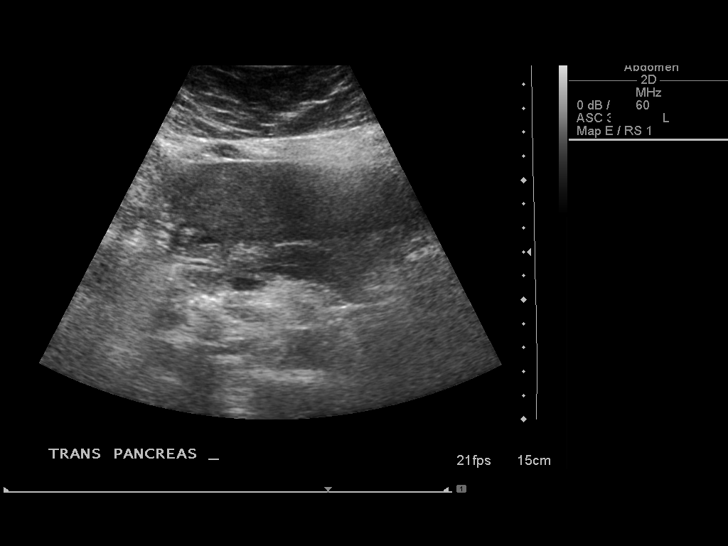
[im 7/74]
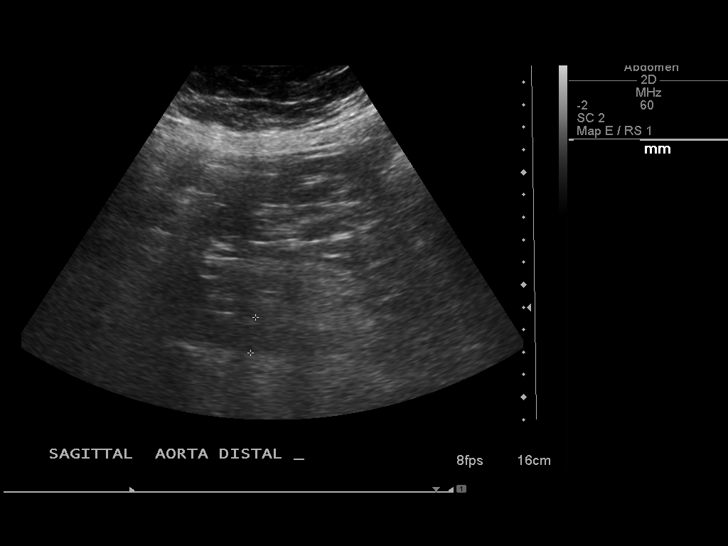
[im 13/74]
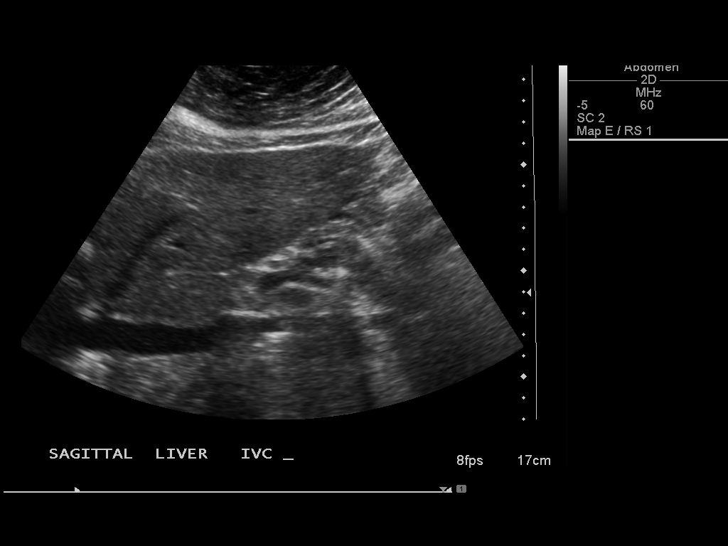
[im 19/74]
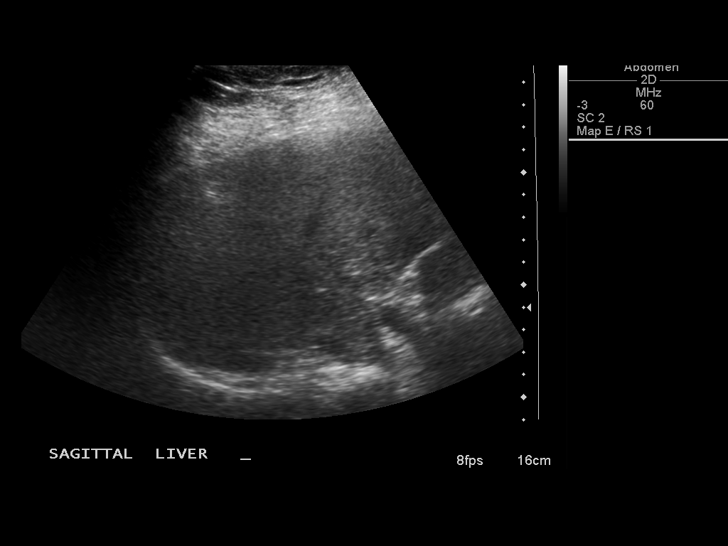
[im 25/74]
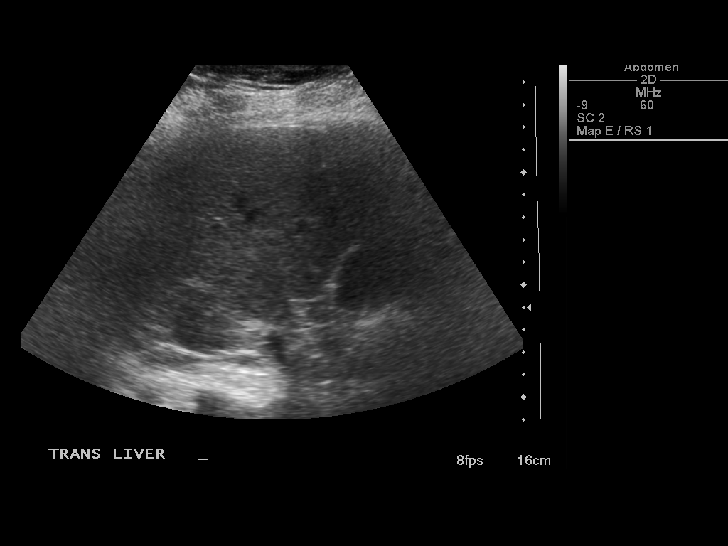
[im 31/74]
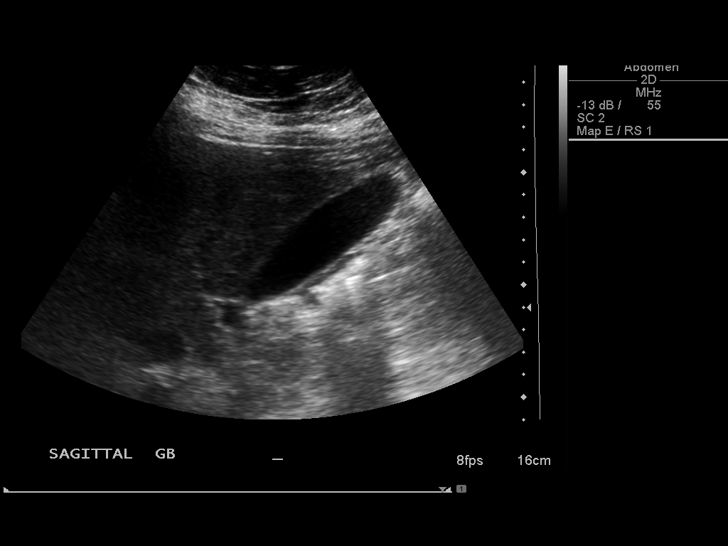
[im 37/74]
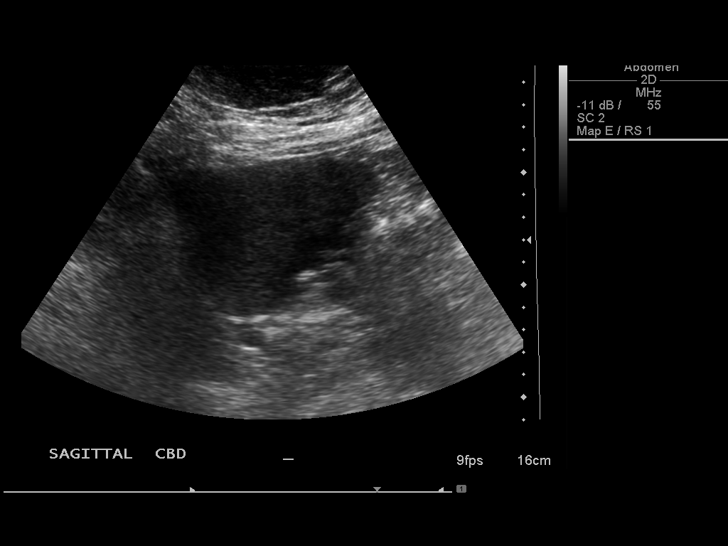
[im 43/74]
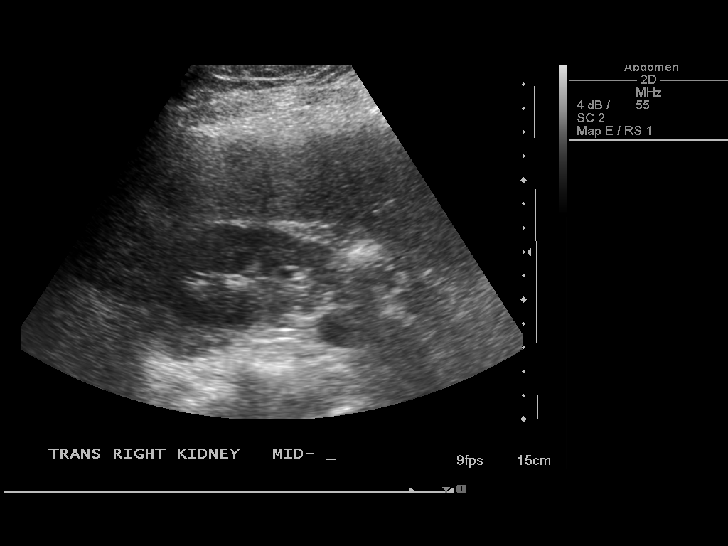
[im 49/74]
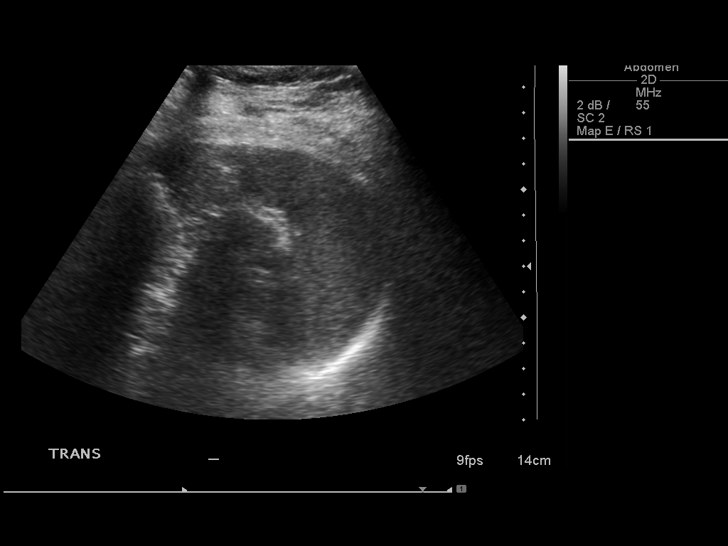
[im 55/74]
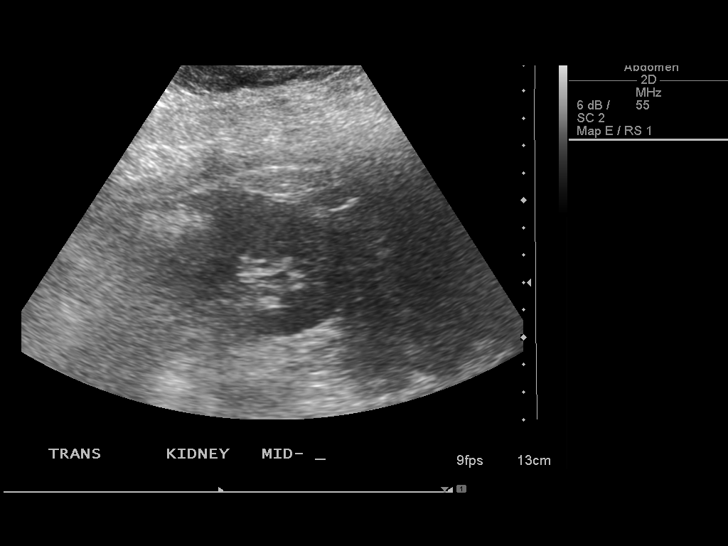
[im 61/74]
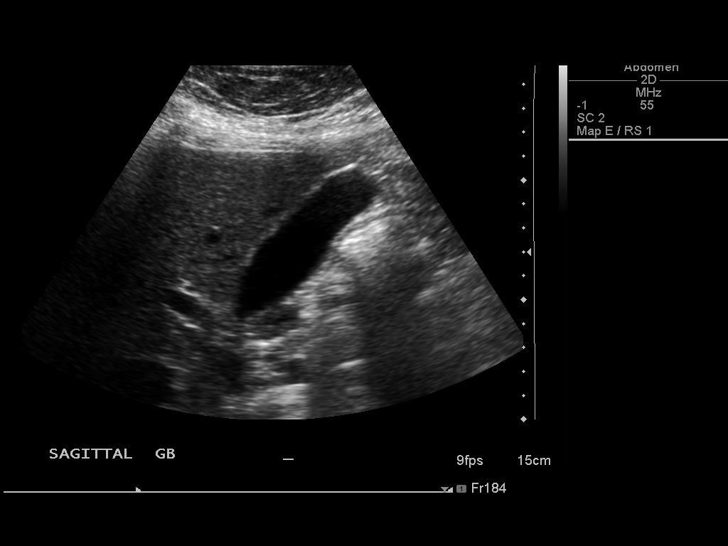
[im 67/74]
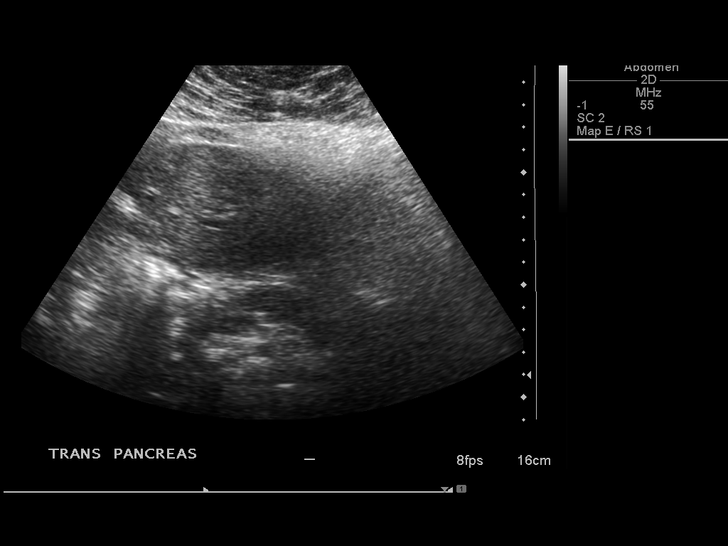
[im 74/74]
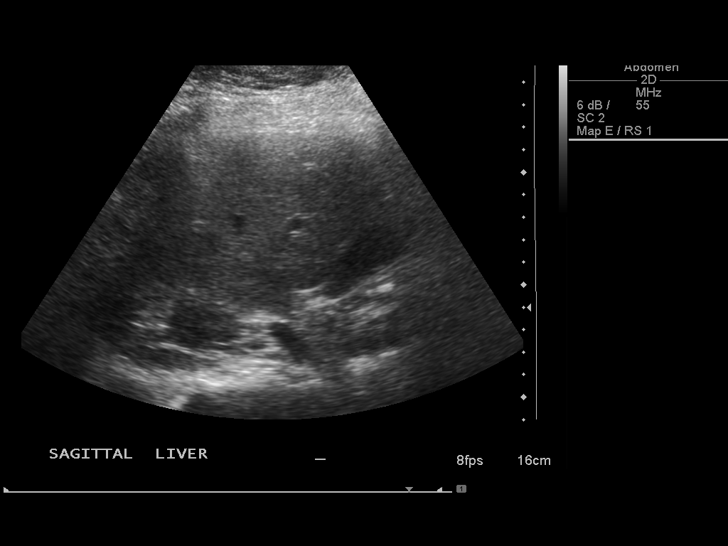

[13 of 25 positions shown; findings below may reference images not displayed]

FINDINGS: Gallbladder: No shadowing gallstones or echogenic sludge. No
gallbladder wall thickening or pericholecystic fluid. The
gallbladder wall thickness measured 2.0 mm. No sonographic Murphy's
sign according to the ultrasound technologist.

CBD: Normal in caliber measuring 4.4 mm. No choledocholithiasis is
evident.

Liver:  Normal size with increased echogenicity of hepatic
parenchymal echotexture and mildly heterogeneous appearance without
focal mass. No definite surface nodularity is seen.. Portal vein is
patent with hepatopetal flow.

IVC:  Patent throughout its visualized course in the abdomen.

Pancreas:  Although the pancreas is difficult to visualize in its
entirety, no focal pancreatic abnormality is identified. Pancreatic
duct measured 2.3 mm.

Spleen:  Normal size and echotexture without focal abnormality.
Length is 6 cm.

Right kidney:  No hydronephrosis.  Well-preserved cortex.  Normal
parenchymal echotexture without focal abnormalities.  Right renal
length is 11.1 cm.

Left kidney:  No hydronephrosis.  Well-preserved cortex.  Normal
parenchymal echotexture without focal abnormalities.  Left renal
length is 10.1 cm.

Aorta:  Maximum diameter is 2.3 cm.  No aneurysm is evident.

Ascites:  None.
IMPRESSION: No acute abdominal pathology was demonstrated.  There is a mildly
heterogeneous appearance of the hepatic parenchyma with some
increased echogenicity. This appearance may be associated with
fatty infiltration of the liver and hepatocellular disease.  No
hepatic mass was identified.  No definite surface nodularity is
demonstrated.  No splenomegaly or ascites is evident.

## 2012-02-18 ENCOUNTER — Ambulatory Visit
Admission: RE | Admit: 2012-02-18 | Discharge: 2012-02-18 | Disposition: A | Discharge status: Home / Self Care | Payer: Self-pay | Source: Ambulatory Visit | Attending: Family Medicine | Admitting: Family Medicine

## 2012-02-18 DIAGNOSIS — Z1231 Encounter for screening mammogram for malignant neoplasm of breast: Secondary | ICD-10-CM

## 2012-03-05 ENCOUNTER — Ambulatory Visit: Payer: Self-pay | Admitting: Gastroenterology

## 2012-07-13 ENCOUNTER — Encounter: Payer: Self-pay | Admitting: Family Medicine

## 2012-08-05 ENCOUNTER — Ambulatory Visit: Payer: Self-pay | Admitting: Family Medicine

## 2012-08-13 ENCOUNTER — Ambulatory Visit (INDEPENDENT_AMBULATORY_CARE_PROVIDER_SITE_OTHER): Payer: Self-pay | Admitting: Family Medicine

## 2012-08-13 ENCOUNTER — Encounter: Payer: Self-pay | Admitting: Family Medicine

## 2012-08-13 VITALS — BP 186/117 | HR 96 | Temp 98.3°F | Ht 63.5 in | Wt 242.0 lb

## 2012-08-13 DIAGNOSIS — B1920 Unspecified viral hepatitis C without hepatic coma: Secondary | ICD-10-CM

## 2012-08-13 DIAGNOSIS — E66812 Obesity, class 2: Secondary | ICD-10-CM | POA: Insufficient documentation

## 2012-08-13 DIAGNOSIS — E119 Type 2 diabetes mellitus without complications: Secondary | ICD-10-CM

## 2012-08-13 DIAGNOSIS — Z6837 Body mass index (BMI) 37.0-37.9, adult: Secondary | ICD-10-CM | POA: Insufficient documentation

## 2012-08-13 DIAGNOSIS — F17200 Nicotine dependence, unspecified, uncomplicated: Secondary | ICD-10-CM

## 2012-08-13 DIAGNOSIS — B182 Chronic viral hepatitis C: Secondary | ICD-10-CM | POA: Insufficient documentation

## 2012-08-13 DIAGNOSIS — E1165 Type 2 diabetes mellitus with hyperglycemia: Secondary | ICD-10-CM | POA: Insufficient documentation

## 2012-08-13 DIAGNOSIS — Z720 Tobacco use: Secondary | ICD-10-CM | POA: Insufficient documentation

## 2012-08-13 DIAGNOSIS — I10 Essential (primary) hypertension: Secondary | ICD-10-CM | POA: Insufficient documentation

## 2012-08-13 MED ORDER — LISINOPRIL 40 MG PO TABS: 40.0000 mg | ORAL_TABLET | Freq: Every day | ORAL | Status: DC

## 2012-08-13 MED ORDER — ATENOLOL-CHLORTHALIDONE 50-25 MG PO TABS: 1.0000 | ORAL_TABLET | Freq: Every day | ORAL | Status: DC

## 2012-08-13 NOTE — Assessment & Plan Note (Signed)
Refilled lisinopril 40 and atenolol/chlorthalidone 50/25. Pt reports good control on this regimen previously.  Needs CMET once she has orange card. Will also need FLP and A1c.

## 2012-08-13 NOTE — Assessment & Plan Note (Signed)
Pt reports being taken off of her metformin before [facility] closed.  Will get A1c once she has orange card.

## 2012-08-13 NOTE — Progress Notes (Signed)
S: Pt comes in today for NP visit.  Other issues include: HTN- is usually on atenolol/chlorthalidone 50/25 (has been out of for months) and lisinopril 40; no chest pain, SOB, blurry vision, HA  DM- diagnosed about 3 years ago; taken off of metformin by MD about 3 months ago; at home were running 80's- low 100's; hasn't check it in months because doesn't have any strips Tobacco abuse- knows she needs to quit but not interested   Hep C- was previously followed by clinic in [place], has not needed any medicines    ROS: Per HPI  All past medical, social, surgical, and family histories were reviewed and updated as above.   History  Smoking status  . Current Every Day Smoker -- 0.5 packs/day  . Types: Cigarettes  Smokeless tobacco  . Not on file    O:  Filed Vitals:   08/13/12 0859  BP: 186/117  Pulse: 96  Temp: 98.3 F (36.8 C)    Gen: NAD, pleasant, obese  CV: RRR, 2/6 systolic murmur loudest over L 2nd intercostal space  Pulm: CTA bilat, no wheezes or crackles Ext: Warm, no edema   A/P: 56 y.o. female p/w NP visit -See problem list -f/u in 1 month

## 2012-08-13 NOTE — Patient Instructions (Signed)
It was nice to meet you today.  We will wait until you get your orange card to check labs.  Once you have it, we will check your kidneys, liver, diabetes, and cholesterol.  I refilled your blood pressure medicines.  Please come back to have your blood pressure rechecked early next week (Monday, if possible).  Come back to see me in about 1 month.

## 2012-08-13 NOTE — Assessment & Plan Note (Signed)
Counseled on smoking cessation, not ready to quit, still pre-contemplative

## 2012-09-30 ENCOUNTER — Ambulatory Visit (INDEPENDENT_AMBULATORY_CARE_PROVIDER_SITE_OTHER): Payer: No Typology Code available for payment source | Admitting: Family Medicine

## 2012-09-30 ENCOUNTER — Encounter: Payer: Self-pay | Admitting: Family Medicine

## 2012-09-30 ENCOUNTER — Ambulatory Visit: Payer: No Typology Code available for payment source | Admitting: Family Medicine

## 2012-09-30 VITALS — BP 167/117 | HR 74 | Ht 63.5 in | Wt 243.0 lb

## 2012-09-30 DIAGNOSIS — E119 Type 2 diabetes mellitus without complications: Secondary | ICD-10-CM

## 2012-09-30 DIAGNOSIS — I10 Essential (primary) hypertension: Secondary | ICD-10-CM

## 2012-09-30 LAB — COMPREHENSIVE METABOLIC PANEL
Albumin: 3.7 g/dL (ref 3.5–5.2)
BUN: 14 mg/dL (ref 6–23)
CO2: 33 mEq/L — ABNORMAL HIGH (ref 19–32)
Calcium: 9.2 mg/dL (ref 8.4–10.5)
Chloride: 100 mEq/L (ref 96–112)
Glucose, Bld: 113 mg/dL — ABNORMAL HIGH (ref 70–99)
Potassium: 4.1 mEq/L (ref 3.5–5.3)

## 2012-09-30 MED ORDER — ATENOLOL-CHLORTHALIDONE 100-25 MG PO TABS: 1.0000 | ORAL_TABLET | Freq: Every day | ORAL | Status: DC

## 2012-09-30 NOTE — Patient Instructions (Addendum)
It was good to see you today.  Your diabetes is perfect-- no need to start any medicines!  We are checking some labs today-- I will send you a letter with the results.   Your blood pressure is still high.  I am changing your combo medicine to a little higher of a dose-- it's waiting at the pharmacy.  Let's plan to have you come back and see me in about 3 weeks.  If it is still high, we will add another medicine and make an appointment for you to have the 24 hour blood pressure monitoring done with Dr. Raymondo Band.

## 2012-09-30 NOTE — Progress Notes (Signed)
S: Pt comes in today for follow up.  DIABETES Home CBGs: doesn't check  Meds: diet controlled Hypoglycemic episodes?: no Symptoms: Polyuria: no Polydipsia: no Parasthesias: no   Dizziness: no  Nausea:  no Vomiting:  no Last A1c:  Lab Results  Component Value Date   HGBA1C 6.2 09/30/2012    HYPERTENSION BP: 167/117 Meds: lisino 40, atenolol/chlorthalidone 50/25 Taking meds: Yes     # of doses missed/week: 0 Symptoms: Headache: No Dizziness: No Vision changes: No SOB:  No Chest pain: No LE swelling: No Tobacco use: Yes- 1/3 ppd (5-6 cig/day) She reports she has never had good BP control on any regimen in the past.    ROS: Per HPI  History  Smoking status  . Current Every Day Smoker -- 0.50 packs/day  . Types: Cigarettes  Smokeless tobacco  . Never Used    O:  Filed Vitals:   09/30/12 0906  BP: 167/117  Pulse: 74    Gen: NAD CV: RRR, no murmur Pulm: CTA bilat, no wheezes or crackles Ext: Warm, no edema   A/P: 57 y.o. female p/w DM, HTN -See problem list -f/u in 1 month

## 2012-09-30 NOTE — Assessment & Plan Note (Signed)
Still elevated on lisino 40, aten/chlorthal 50/25, will increase atenolol to 100.  Pt aware we will likely need to add additional agent at next appt (?norvasc) and consider 24hr BP monitoring referral at that time.

## 2012-09-30 NOTE — Assessment & Plan Note (Signed)
A1c well controlled at 6.2 on NO medications. Continue diet modifications.

## 2012-10-01 ENCOUNTER — Encounter: Payer: Self-pay | Admitting: Family Medicine

## 2012-10-14 ENCOUNTER — Ambulatory Visit: Payer: No Typology Code available for payment source | Admitting: Family Medicine

## 2012-11-03 ENCOUNTER — Ambulatory Visit: Payer: No Typology Code available for payment source | Admitting: Family Medicine

## 2012-11-16 ENCOUNTER — Encounter: Payer: Self-pay | Admitting: Family Medicine

## 2012-11-16 ENCOUNTER — Ambulatory Visit (INDEPENDENT_AMBULATORY_CARE_PROVIDER_SITE_OTHER): Payer: No Typology Code available for payment source | Admitting: Family Medicine

## 2012-11-16 VITALS — BP 141/86 | HR 76 | Ht 63.5 in | Wt 244.4 lb

## 2012-11-16 DIAGNOSIS — I10 Essential (primary) hypertension: Secondary | ICD-10-CM

## 2012-11-16 DIAGNOSIS — E119 Type 2 diabetes mellitus without complications: Secondary | ICD-10-CM

## 2012-11-16 MED ORDER — LISINOPRIL 40 MG PO TABS: 40.0000 mg | ORAL_TABLET | Freq: Every day | ORAL | Status: DC

## 2012-11-16 NOTE — Addendum Note (Signed)
Addended by: Demetria Pore A on: 11/16/2012 04:14 PM   Modules accepted: Orders

## 2012-11-16 NOTE — Patient Instructions (Addendum)
It was good to see you today.  Your blood pressure looks a lot better.  Let's try to work on weight loss and watching our SALT INTAKE.  Then, maybe we can avoid adding another medicine.  If you want to see our nutritionist, let me know and I can give you her information.    We checked your eyes today here in clinic.  If you need to have more done, we will let you know.  Come back to see me in 4-6 weeks and we will see how your blood pressure is doing with the lower salt diet.   Make this a [facility] appointment, and we will do your pap smear.    2 Gram Low Sodium Diet A 2 gram sodium diet restricts the amount of sodium in the diet to no more than 2 g or 2000 mg daily. Limiting the amount of sodium is often used to help lower blood pressure. It is important if you have heart, liver, or kidney problems. Many foods contain sodium for flavor and sometimes as a preservative. When the amount of sodium in a diet needs to be low, it is important to know what to look for when choosing foods and drinks. The following includes some information and guidelines to help make it easier for you to adapt to a low sodium diet. QUICK TIPS  Do not add salt to food.  Avoid convenience items and fast food.  Choose unsalted snack foods.  Buy lower sodium products, often labeled as "lower sodium" or "no salt added."  Check food labels to learn how much sodium is in 1 serving.  When eating at a restaurant, ask that your food be prepared with less salt or none, if possible. READING FOOD LABELS FOR SODIUM INFORMATION The nutrition facts label is a good place to find how much sodium is in foods. Look for products with no more than 500 to 600 mg of sodium per meal and no more than 150 mg per serving. Remember that 2 g = 2000 mg. The food label may also list foods as:  Sodium-free: Less than 5 mg in a serving.  Very low sodium: 35 mg or less in a serving.  Low-sodium: 140 mg or less in a serving.  Light in  sodium: 50% less sodium in a serving. For example, if a food that usually has 300 mg of sodium is changed to become light in sodium, it will have 150 mg of sodium.  Reduced sodium: 25% less sodium in a serving. For example, if a food that usually has 400 mg of sodium is changed to reduced sodium, it will have 300 mg of sodium. CHOOSING FOODS Grains  Avoid: Salted crackers and snack items. Some cereals, including instant hot cereals. Bread stuffing and biscuit mixes. Seasoned rice or pasta mixes.  Choose: Unsalted snack items. Low-sodium cereals, oats, puffed wheat and rice, shredded wheat. English muffins and bread. Pasta. Meats  Avoid: Salted, canned, smoked, spiced, pickled meats, including fish and poultry. Bacon, ham, sausage, cold cuts, hot dogs, anchovies.  Choose: Low-sodium canned tuna and salmon. Fresh or frozen meat, poultry, and fish. Dairy  Avoid: Processed cheese and spreads. Cottage cheese. Buttermilk and condensed milk. Regular cheese.  Choose: Milk. Low-sodium cottage cheese. Yogurt. Sour cream. Low-sodium cheese. Fruits and Vegetables  Avoid: Regular canned vegetables. Regular canned tomato sauce and paste. Frozen vegetables in sauces. Olives. Rosita Fire. Relishes. Sauerkraut.  Choose: Low-sodium canned vegetables. Low-sodium tomato sauce and paste. Frozen or fresh vegetables. Fresh  and frozen fruit. Condiments  Avoid: Canned and packaged gravies. Worcestershire sauce. Tartar sauce. Barbecue sauce. Soy sauce. Steak sauce. Ketchup. Onion, garlic, and table salt. Meat flavorings and tenderizers.  Choose: Fresh and dried herbs and spices. Low-sodium varieties of mustard and ketchup. Lemon juice. Tabasco sauce. Horseradish. SAMPLE 2 GRAM SODIUM MEAL PLAN Breakfast / Sodium (mg)  1 cup low-fat milk / 143 mg  2 slices whole-wheat toast / 270 mg  1 tbs heart-healthy margarine / 153 mg  1 hard-boiled egg / 139 mg  1 small orange / 0 mg Lunch / Sodium (mg)  1 cup raw  carrots / 76 mg   cup hummus / 298 mg  1 cup low-fat milk / 143 mg   cup red grapes / 2 mg  1 whole-wheat pita bread / 356 mg Dinner / Sodium (mg)  1 cup whole-wheat pasta / 2 mg  1 cup low-sodium tomato sauce / 73 mg  3 oz lean ground beef / 57 mg  1 small side salad (1 cup raw spinach leaves,  cup cucumber,  cup yellow bell pepper) with 1 tsp olive oil and 1 tsp red wine vinegar / 25 mg Snack / Sodium (mg)  1 container low-fat vanilla yogurt / 107 mg  3 graham cracker squares / 127 mg Nutrient Analysis  Calories: 2033  Protein: 77 g  Carbohydrate: 282 g  Fat: 72 g  Sodium: 1971 mg Document Released: 08/05/2005 Document Revised: 10/28/2011 Document Reviewed: 11/06/2009 [facility] Patient Information 2013 [place], [place].

## 2012-11-16 NOTE — Assessment & Plan Note (Signed)
Better controlled today on lisino 40, atenolol 100/chlorthalidone 25.  Pt will work on decreasing salt intake and increasing exercise.  Reports she knows how to monitor salt and has handouts from [facility], just doesn't do it.  Would like to try this before adding additional agent (likely would be norvasc). F/u 4-6 weeks for recheck and pap.

## 2012-11-16 NOTE — Progress Notes (Signed)
S: Pt comes in today for follow up.  HYPERTENSION BP: 141/86 Meds: lisino 40, atenolol 100/chlorthalidone 25 (last change 09/30/12) Taking meds: Yes     # of doses missed/week: 0 Symptoms: Headache: No Dizziness: No Vision changes: No SOB:  No Chest pain: No LE swelling: No Tobacco use: Yes BP Readings from Last 3 Encounters:  11/16/12 141/86  09/30/12 167/117  08/13/12 186/117      ROS: Per HPI  History  Smoking status  . Current Every Day Smoker -- 0.50 packs/day  . Types: Cigarettes  Smokeless tobacco  . Never Used    O:  Filed Vitals:   11/16/12 1416  BP: 141/86  Pulse: 76    Gen: NAD CV: RRR, no murmur Pulm: CTA bilat, no wheezes or crackles Ext: Warm,  no edema   A/P: 57 y.o. female p/w HTN -See problem list -f/u in 4-6 weeks for BP recheck and pap

## 2012-11-23 ENCOUNTER — Encounter: Payer: Self-pay | Admitting: Family Medicine

## 2012-11-26 ENCOUNTER — Other Ambulatory Visit (INDEPENDENT_AMBULATORY_CARE_PROVIDER_SITE_OTHER): Payer: No Typology Code available for payment source | Admitting: *Deleted

## 2012-11-26 DIAGNOSIS — E119 Type 2 diabetes mellitus without complications: Secondary | ICD-10-CM

## 2012-12-10 ENCOUNTER — Encounter: Payer: No Typology Code available for payment source | Admitting: Family Medicine

## 2013-02-14 ENCOUNTER — Other Ambulatory Visit: Payer: Self-pay | Admitting: Family Medicine

## 2013-02-16 ENCOUNTER — Ambulatory Visit: Payer: No Typology Code available for payment source | Admitting: Family Medicine

## 2013-03-18 ENCOUNTER — Other Ambulatory Visit: Payer: Self-pay | Admitting: Family Medicine

## 2013-03-19 ENCOUNTER — Telehealth: Payer: Self-pay | Admitting: Family Medicine

## 2013-03-19 NOTE — Telephone Encounter (Signed)
Will fwd to MD for refills.  Deshana Rominger, Darlyne Russian, CMA

## 2013-03-19 NOTE — Telephone Encounter (Signed)
Pt is calling to let us know that [facility] sent a fax to [place] to request refill's on both of her BP medications. JW

## 2013-03-22 NOTE — Telephone Encounter (Signed)
appt made for 03/31/13 at 8:30 am.  Pt notified and agreeable.  Mio Schellinger, Darlyne Russian, CMA

## 2013-03-22 NOTE — Telephone Encounter (Signed)
Refills ordered in epic for one month's worth. Please call pt to let her know she needs an appointment, was last seen in March and was supposed to follow up within 2 months from that visit.

## 2013-03-25 ENCOUNTER — Ambulatory Visit: Payer: Self-pay

## 2013-03-29 ENCOUNTER — Other Ambulatory Visit: Payer: Self-pay

## 2013-03-29 DIAGNOSIS — Z1231 Encounter for screening mammogram for malignant neoplasm of breast: Secondary | ICD-10-CM

## 2013-03-31 ENCOUNTER — Ambulatory Visit: Payer: Self-pay | Admitting: Family Medicine

## 2013-04-02 ENCOUNTER — Ambulatory Visit: Payer: Self-pay

## 2013-04-09 ENCOUNTER — Ambulatory Visit: Payer: Self-pay

## 2013-04-13 ENCOUNTER — Ambulatory Visit: Payer: Self-pay | Admitting: Family Medicine

## 2013-04-26 ENCOUNTER — Ambulatory Visit: Payer: Self-pay | Admitting: Family Medicine

## 2013-04-26 ENCOUNTER — Ambulatory Visit: Payer: Self-pay

## 2013-04-27 ENCOUNTER — Ambulatory Visit: Payer: Self-pay

## 2013-04-28 ENCOUNTER — Ambulatory Visit: Payer: Self-pay

## 2013-05-10 ENCOUNTER — Ambulatory Visit: Payer: Self-pay

## 2013-05-10 ENCOUNTER — Encounter: Payer: Self-pay | Admitting: Family Medicine

## 2013-05-10 ENCOUNTER — Ambulatory Visit (INDEPENDENT_AMBULATORY_CARE_PROVIDER_SITE_OTHER): Payer: No Typology Code available for payment source | Admitting: Family Medicine

## 2013-05-10 VITALS — BP 184/104 | HR 92 | Temp 97.9°F | Ht 63.5 in | Wt 247.1 lb

## 2013-05-10 DIAGNOSIS — I10 Essential (primary) hypertension: Secondary | ICD-10-CM

## 2013-05-10 DIAGNOSIS — E119 Type 2 diabetes mellitus without complications: Secondary | ICD-10-CM

## 2013-05-10 DIAGNOSIS — Z23 Encounter for immunization: Secondary | ICD-10-CM

## 2013-05-10 LAB — POCT GLYCOSYLATED HEMOGLOBIN (HGB A1C): Hemoglobin A1C: 6.1

## 2013-05-10 MED ORDER — ATENOLOL-CHLORTHALIDONE 100-25 MG PO TABS: ORAL_TABLET | ORAL | Status: DC

## 2013-05-10 MED ORDER — LISINOPRIL 40 MG PO TABS: ORAL_TABLET | ORAL | Status: DC

## 2013-05-10 NOTE — Patient Instructions (Signed)
It was nice to meet you today!  I sent in refills on your blood pressure medicine. Come back in 2 weeks for a physical. We'll recheck your BP then and do your pap smear.  Ice the knee. If it doesn't get better with icing it, we can get an xray at your next appointment. You can also take small doses of ibuprofen (no more than 400mg  every 8 hours).  Be well, Dr. Pollie Meyer

## 2013-05-10 NOTE — Progress Notes (Signed)
Patient ID: Melanie Alvarez, female   DOB: [birth year], 56 y.o.   MRN: [id number]  HPI:  Knee pain: Has had knee pain in her R knee for about one month. About six months ago she slipped in the bathroom and injured the knee, but it has not hurt since that time until about one month ago. She has taken ibuprofen for the pain, but it doesn't help. It hurts about every other day, for around 2-3 hours per day, when she is standing up.   Blood pressure: Normally takes out atenolol-chlorthalidone 100-25mg  daily and lisinopril 40mg  daily but has been out of her medicine x 2 weeks. Denies chest pain, SOB, HA, vision changes, leg swelling.  Diabetes: was told when she was a patient at [facility] that she had diabetes or prediabetes and used to take metformin. Her last A1c prior to today was less than 6.5 off of medicine. Does not check blood sugar at home.  ROS: See HPI  PMFSH: hx hepatitis C  PHYSICAL EXAM: BP 184/104  Pulse 92  Temp(Src) 97.9 F (36.6 C) (Oral)  Ht 5' 3.5" (1.613 m)  Wt 247 lb 1.6 oz (112.084 kg)  BMI 43.08 kg/m2 Gen: NAD Heart: RRR Lungs: CTAB Neuro: grossly nonfocal, speech intact Ext: R knee with some crepitus, no erythema or effusion, TTP medial > anterior along joint line, good strength with knee flexion & extension, No appreciable lower extremity edema bilaterally  ASSESSMENT/PLAN:  # Health maintenance:  -recommended pap smear to patient, she will schedule an appointment for this -flu shot given today   # Knee pain: no deformities on exam, but does have some crepitus with flexion and tenderness along joint line. Likely some component of osteoarthritis, but has not ever had imaging. Patient has not tried icing the knee, so I will have her do this nightly to see if it gives her any benefit. Also recommend continuing ibuprofen (dose limited due to Hep C hx). If still has pain at f/u visit in 2 weeks, will likely obtain plain films of knee. Precepted with Dr. Leveda Anna who  agrees with this plan.   See problem based charting for additional assessment/plan.   FOLLOW UP: F/u in 2 weeks for pap smear, recheck BP & f/u knee pain

## 2013-05-11 NOTE — Assessment & Plan Note (Signed)
A1c controlled not on any medicines. Given that she has had persistently controlld A1c without any diabetes medicine, I am unsure of why she was ever dx'd with diabetes initially. Pt denies any historical changes in weight or diet. Will change problem in problem list to "pre-diabetes" and monitor A1c every 6 months.

## 2013-05-11 NOTE — Assessment & Plan Note (Signed)
BP elevated today but has been out of medicine x 2 weeks. Will refill meds and have pt return for visit in 2 weeks to recheck BP.

## 2013-06-07 ENCOUNTER — Ambulatory Visit: Payer: No Typology Code available for payment source

## 2013-06-07 ENCOUNTER — Ambulatory Visit: Payer: No Typology Code available for payment source | Admitting: Family Medicine

## 2013-06-14 ENCOUNTER — Ambulatory Visit: Payer: No Typology Code available for payment source | Admitting: Family Medicine

## 2013-06-24 ENCOUNTER — Ambulatory Visit: Payer: No Typology Code available for payment source | Admitting: Family Medicine

## 2013-07-06 ENCOUNTER — Ambulatory Visit: Payer: No Typology Code available for payment source | Admitting: Family Medicine

## 2013-07-06 ENCOUNTER — Ambulatory Visit: Payer: No Typology Code available for payment source

## 2013-07-09 ENCOUNTER — Ambulatory Visit: Payer: No Typology Code available for payment source | Admitting: Family Medicine

## 2013-07-14 ENCOUNTER — Other Ambulatory Visit: Payer: Self-pay | Admitting: Family Medicine

## 2013-07-26 ENCOUNTER — Other Ambulatory Visit: Payer: Self-pay | Admitting: Family Medicine

## 2013-07-26 MED ORDER — ATENOLOL-CHLORTHALIDONE 100-25 MG PO TABS: ORAL_TABLET | ORAL | Status: DC

## 2013-08-02 ENCOUNTER — Other Ambulatory Visit: Payer: Self-pay | Admitting: Family Medicine

## 2013-08-03 NOTE — Telephone Encounter (Signed)
Will fwd to MD.  Curtiss Mahmood L, CMA  

## 2013-08-03 NOTE — Telephone Encounter (Signed)
Pt's pharmacy sent over request for refill, but is showing pending status still.  Patient inquiring when refill will be completed.  Out of medication at present.

## 2013-08-04 ENCOUNTER — Other Ambulatory Visit: Payer: Self-pay | Admitting: Family Medicine

## 2013-08-04 NOTE — Telephone Encounter (Signed)
Pt notified that Rx has been refilled.  Charliene Inoue, Darlyne Russian, CMA

## 2013-08-09 ENCOUNTER — Ambulatory Visit
Admission: RE | Admit: 2013-08-09 | Discharge: 2013-08-09 | Disposition: A | Discharge status: Home / Self Care | Payer: No Typology Code available for payment source | Source: Ambulatory Visit

## 2013-08-09 ENCOUNTER — Ambulatory Visit: Payer: No Typology Code available for payment source | Admitting: Family Medicine

## 2013-08-09 DIAGNOSIS — Z1231 Encounter for screening mammogram for malignant neoplasm of breast: Secondary | ICD-10-CM

## 2013-09-04 ENCOUNTER — Other Ambulatory Visit: Payer: Self-pay | Admitting: Family Medicine

## 2013-09-07 ENCOUNTER — Telehealth: Payer: Self-pay | Admitting: Family Medicine

## 2013-09-07 NOTE — Telephone Encounter (Signed)
Called pt.LMVM to call back. Please see message. Thanks. .[name]  

## 2013-09-07 NOTE — Telephone Encounter (Signed)
To [facility] red team - please call pt and let her know I have refilled one month's worth of her BP medicines but she needs to schedule an appointment to follow up on her blood pressure and other medical problems. Thanks!  Leeanne Rio, MD

## 2013-09-08 ENCOUNTER — Other Ambulatory Visit: Payer: Self-pay | Admitting: Family Medicine

## 2013-09-08 NOTE — Telephone Encounter (Signed)
Melanie Alvarez called yesterday regarding request from pharmacy for medications for bp.Marland KitchenMarland KitchenOriginal request was sent according to patient on Friday last.   2nd request was sent yesterday.  Pt out of meds.  Need provider send refill in asap.

## 2013-09-09 ENCOUNTER — Other Ambulatory Visit: Payer: Self-pay | Admitting: Family Medicine

## 2013-09-15 ENCOUNTER — Ambulatory Visit: Payer: Self-pay

## 2013-09-28 ENCOUNTER — Ambulatory Visit: Payer: No Typology Code available for payment source

## 2013-10-13 ENCOUNTER — Other Ambulatory Visit: Payer: Self-pay | Admitting: Family Medicine

## 2013-10-13 NOTE — Telephone Encounter (Signed)
Pt wants to know why is she having to request refills on her BP meds every month?

## 2013-10-13 NOTE — Telephone Encounter (Signed)
She is having to call because she needs to schedule an appointment. She was supposed to follow up 2 weeks after her last appointment, which was in September. Please tell pt to schedule an appointment. I will refill x 1 additional month but will need to see her before giving more refills.  Leeanne Rio, MD

## 2013-11-01 ENCOUNTER — Ambulatory Visit (INDEPENDENT_AMBULATORY_CARE_PROVIDER_SITE_OTHER): Payer: No Typology Code available for payment source | Admitting: *Deleted

## 2013-11-01 DIAGNOSIS — Z111 Encounter for screening for respiratory tuberculosis: Secondary | ICD-10-CM

## 2013-11-01 NOTE — Progress Notes (Signed)
   Pt in clinic for PPD placement.  Out of Tuberculin [place].  Pt to call back on Wednesday to check supply. Derl Barrow, RN

## 2013-11-17 ENCOUNTER — Telehealth: Payer: Self-pay | Admitting: Family Medicine

## 2013-11-17 ENCOUNTER — Other Ambulatory Visit: Payer: Self-pay | Admitting: Family Medicine

## 2013-11-17 NOTE — Telephone Encounter (Signed)
Made an appt for patient for 12/20/13 @ 2:30.  Patient was instructed that she needed to keep appt for further refills.  Provider will refill meds for one months worth.  Patient was agreeable to decision.

## 2013-11-18 NOTE — Telephone Encounter (Signed)
Rx sent in Melanie Alvarez J Becca Bayne, MD  

## 2013-11-19 ENCOUNTER — Other Ambulatory Visit: Payer: Self-pay | Admitting: Family Medicine

## 2013-12-20 ENCOUNTER — Encounter: Payer: Self-pay | Admitting: Family Medicine

## 2013-12-20 ENCOUNTER — Ambulatory Visit (INDEPENDENT_AMBULATORY_CARE_PROVIDER_SITE_OTHER): Payer: No Typology Code available for payment source | Admitting: Family Medicine

## 2013-12-20 VITALS — BP 139/84 | HR 66 | Temp 98.3°F | Ht 63.5 in | Wt 249.0 lb

## 2013-12-20 DIAGNOSIS — I10 Essential (primary) hypertension: Secondary | ICD-10-CM

## 2013-12-20 MED ORDER — LISINOPRIL 40 MG PO TABS: ORAL_TABLET | ORAL | Status: DC

## 2013-12-20 MED ORDER — ATENOLOL-CHLORTHALIDONE 100-25 MG PO TABS: 1.0000 | ORAL_TABLET | Freq: Every day | ORAL | Status: DC

## 2013-12-20 NOTE — Progress Notes (Signed)
Patient ID: Melanie Alvarez, female   DOB: 22-Mar-1956, 58 y.o.   MRN: [id number]  HPI:  HTN: does not check BP at home. Denies chest pain, shortness of breath, lower extremity edema, or headaches. She took her medication this morning. Currently taking atenolol-chlorthalidone 100-25mg  daily, and lisinopril 40mg  daily.   ROS: See HPI  [place]: hx HTN, prediabetes, tobacco abuse, hep C  PHYSICAL EXAM: BP 139/84  Pulse 66  Temp(Src) 98.3 F (36.8 C) (Oral)  Ht 5' 3.5" (1.613 m)  Wt 249 lb (112.946 kg)  BMI 43.41 kg/m2 Gen: NAD HEENT: NCAT Heart: RRR, no murmurs Lungs: CTAB, NWOB Neuro: grossly nonfocal, speech normal Ext: No appreciable lower extremity edema bilaterally   ASSESSMENT/PLAN:  # Health maintenance:  -advised pt to schedule appointment for a physical within the next month to go over health maintenance items - will plan to check bloodwork at her physical appointment since she is nonfasting today.  See problem based charting for additional assessment/plan.  FOLLOW UP: F/u in 1 month for complete physical and labs. F/u in 3 months for HTN.  [place]. Ardelia Mems, [place]

## 2013-12-20 NOTE — Patient Instructions (Signed)
It was great to see you again today!  Follow up with me in the next month or so for a physical. We will check your bloodwork at that appointment.  I need to see you back in 3 months for your blood pressure.  Be well, Dr. Ardelia Mems

## 2013-12-26 NOTE — Assessment & Plan Note (Signed)
Well controlled. Continue current regimen. Due for lipid panel and CMET but nonfasting today. Will check these when she follows up for a complete physical/health maintenance visit.

## 2014-02-10 ENCOUNTER — Encounter: Payer: No Typology Code available for payment source | Admitting: Family Medicine

## 2014-02-28 ENCOUNTER — Telehealth: Payer: Self-pay | Admitting: Family Medicine

## 2014-02-28 ENCOUNTER — Encounter: Payer: No Typology Code available for payment source | Admitting: Family Medicine

## 2014-02-28 NOTE — Telephone Encounter (Signed)
Pt stated her feet was so swollen that she could not put shoes on her feet.  Pt had an appt for today for a physical.  Pt advised she should have kept so provider can take a look at her feet.  Pt stated her feet was going down some.  Pt advised to keep feet elevated and if anything changes to call back.  Pt denies any other symptoms.  Physical appt reschedule for 03/07/2014. Derl Barrow, RN

## 2014-02-28 NOTE — Telephone Encounter (Signed)
Pt called and would like a nurse to call her about the swelling in her feet. jw

## 2014-03-07 ENCOUNTER — Encounter: Payer: No Typology Code available for payment source | Admitting: Family Medicine

## 2014-03-28 ENCOUNTER — Telehealth: Payer: Self-pay | Admitting: Family Medicine

## 2014-03-28 MED ORDER — LISINOPRIL 40 MG PO TABS: ORAL_TABLET | ORAL | Status: DC

## 2014-03-28 NOTE — Telephone Encounter (Signed)
Refill request for lisinopril. Patient has appt on 8/24.

## 2014-03-28 NOTE — Telephone Encounter (Signed)
Rx sent in. Thanks! Lareen Mullings J March Joos, MD  

## 2014-03-30 ENCOUNTER — Other Ambulatory Visit: Payer: Self-pay | Admitting: Family Medicine

## 2014-04-11 ENCOUNTER — Ambulatory Visit: Payer: Self-pay | Admitting: Family Medicine

## 2014-06-20 ENCOUNTER — Encounter: Payer: Self-pay | Admitting: Family Medicine

## 2014-07-08 ENCOUNTER — Other Ambulatory Visit: Payer: Self-pay

## 2014-07-08 DIAGNOSIS — Z1231 Encounter for screening mammogram for malignant neoplasm of breast: Secondary | ICD-10-CM

## 2014-08-05 ENCOUNTER — Other Ambulatory Visit: Payer: Self-pay | Admitting: Family Medicine

## 2014-08-07 ENCOUNTER — Other Ambulatory Visit: Payer: Self-pay | Admitting: Family Medicine

## 2014-08-10 ENCOUNTER — Ambulatory Visit: Payer: Self-pay

## 2014-08-18 ENCOUNTER — Ambulatory Visit: Payer: Self-pay

## 2014-09-05 ENCOUNTER — Ambulatory Visit: Payer: Self-pay

## 2014-09-08 ENCOUNTER — Ambulatory Visit
Admission: RE | Admit: 2014-09-08 | Discharge: 2014-09-08 | Disposition: A | Discharge status: Home / Self Care | Payer: BC Managed Care – PPO | Source: Ambulatory Visit

## 2014-09-08 DIAGNOSIS — Z1231 Encounter for screening mammogram for malignant neoplasm of breast: Secondary | ICD-10-CM

## 2014-09-08 IMAGING — MG MM DIGITAL SCREENING BILAT
5 series · 5 of 5 positions shown · non-contrast
Comparison: Previous exam(s).

CLINICAL DATA: Screening.

EXAM:
DIGITAL SCREENING BILATERAL MAMMOGRAM WITH CAD

[R CC]
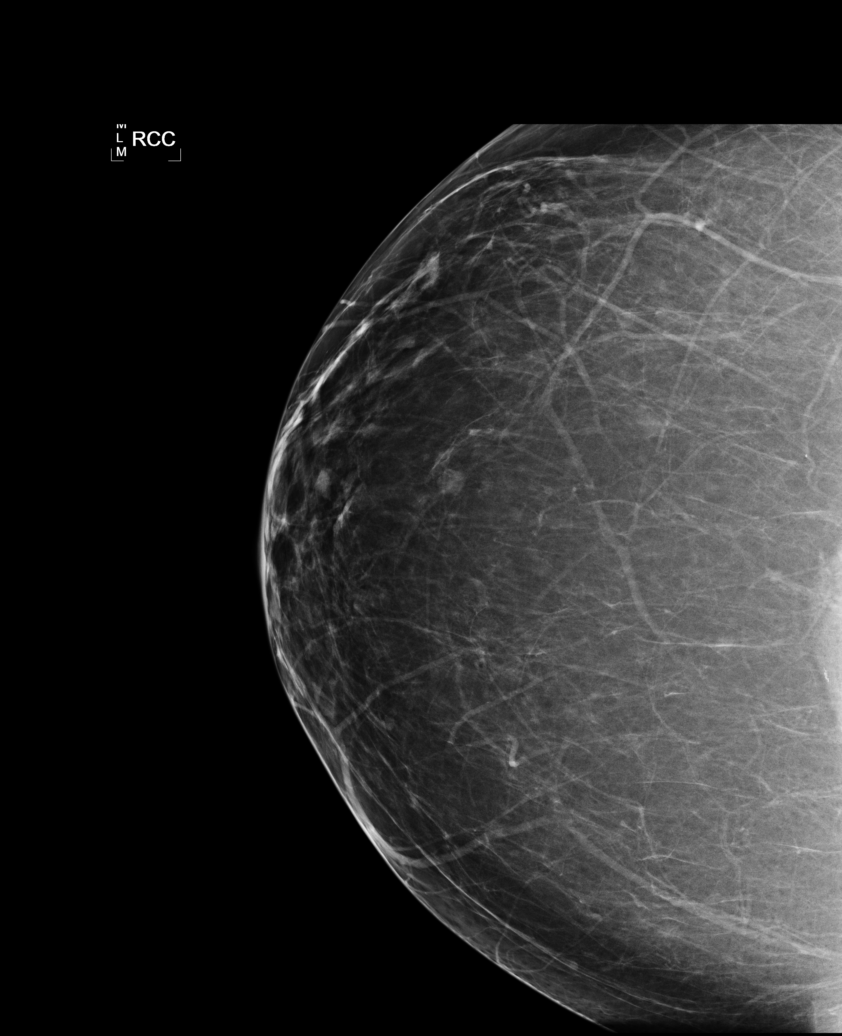

[L CC]
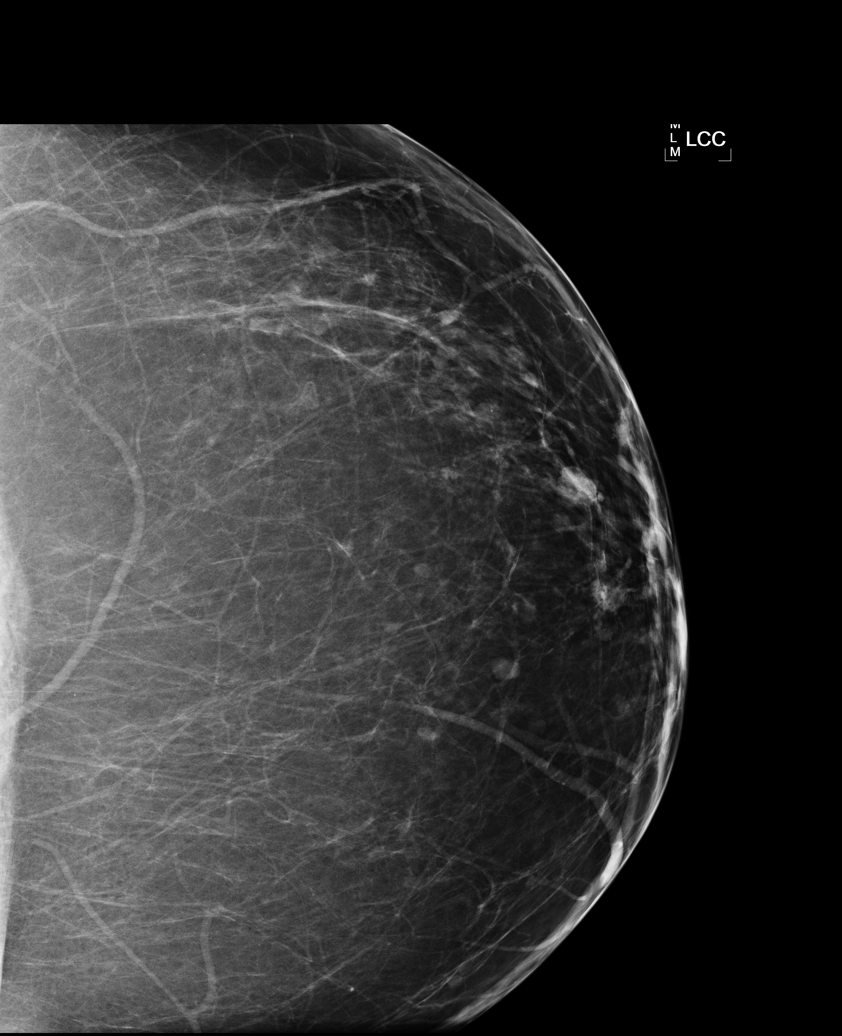

[L MLO]
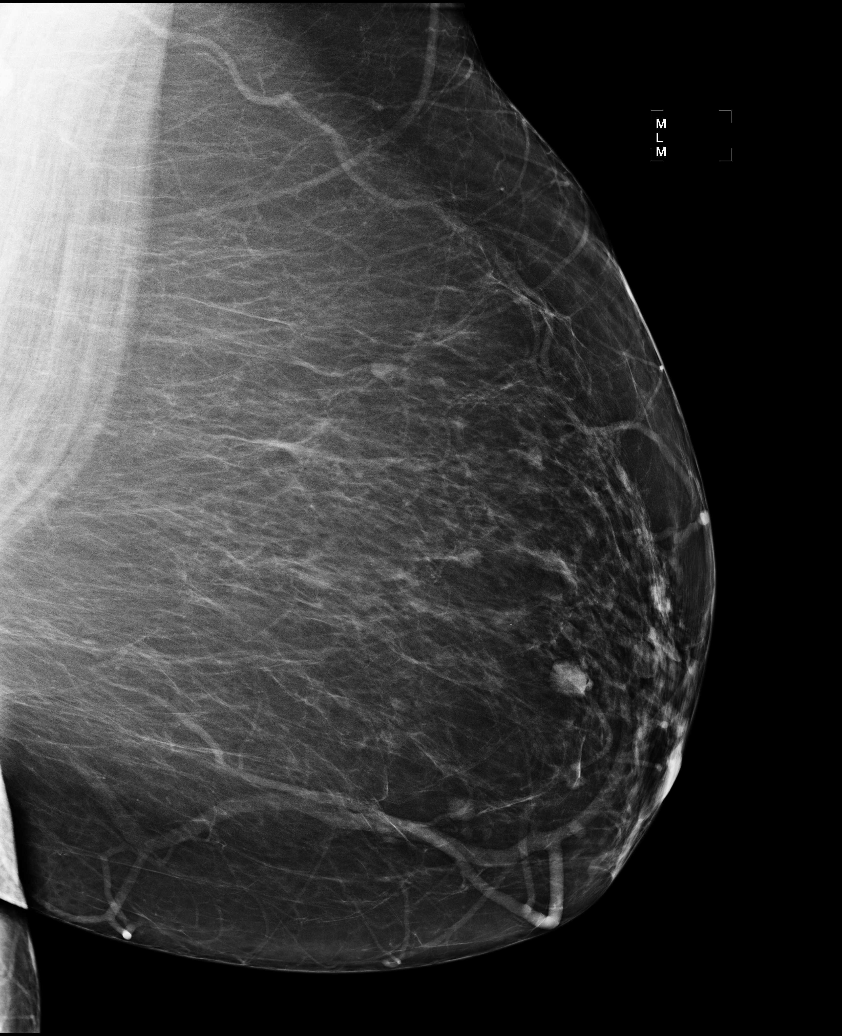

[R MLO]
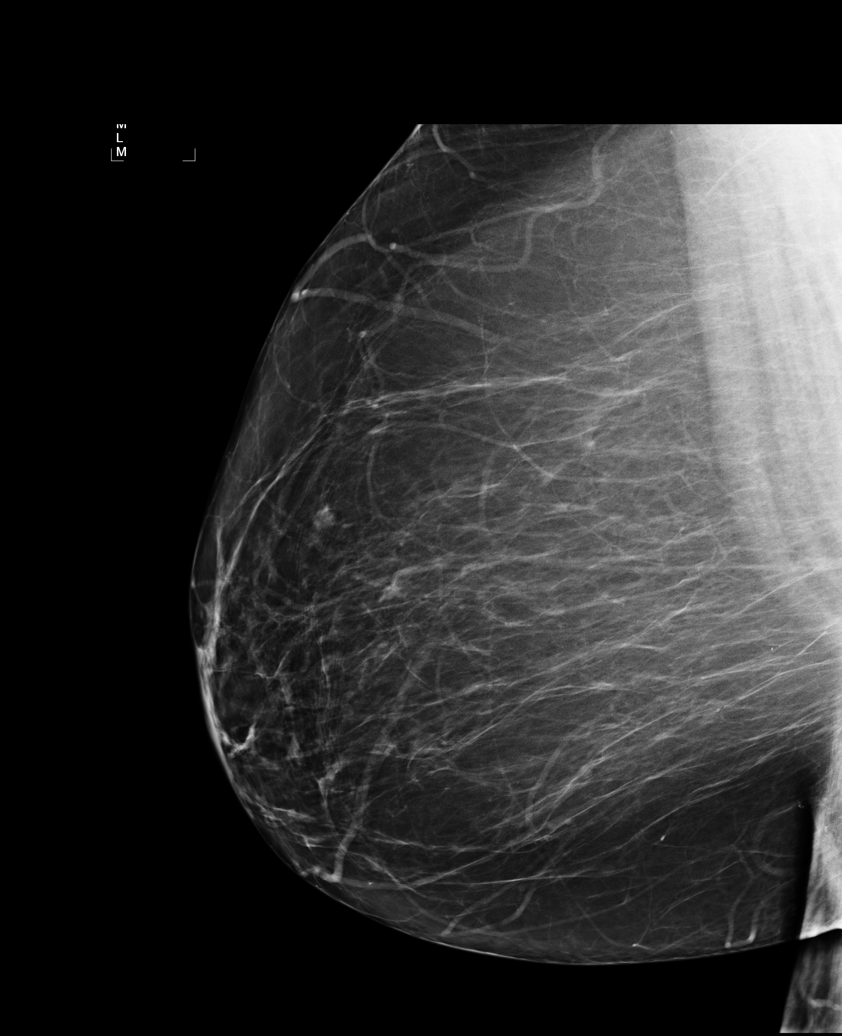

[L CV]
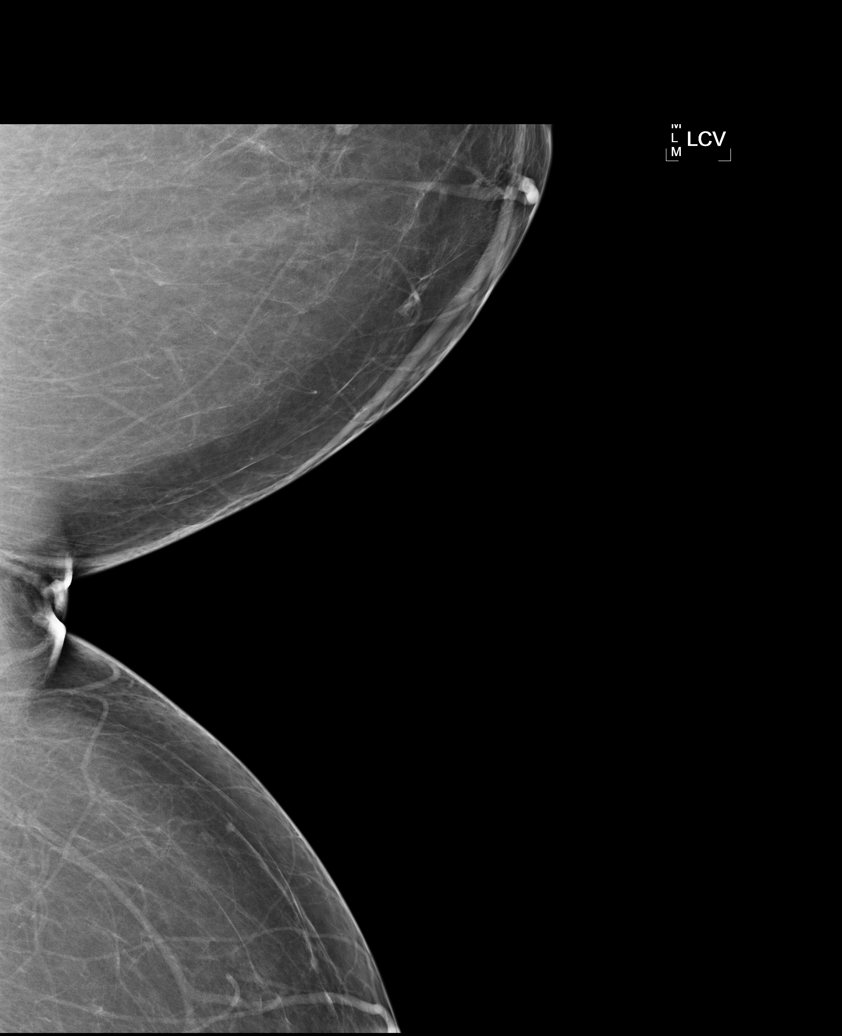

[5 of 5 positions shown; findings below may reference images not displayed]

ACR Breast Density Category b: There are scattered areas of
fibroglandular density.
FINDINGS: There are no findings suspicious for malignancy. Images were
processed with CAD.
IMPRESSION: No mammographic evidence of malignancy. A result letter of this
screening mammogram will be mailed directly to the patient.

RECOMMENDATION:
Screening mammogram in one year. (Code:[US])

BI-RADS CATEGORY  1: Negative.

## 2014-09-13 ENCOUNTER — Telehealth: Payer: Self-pay | Admitting: *Deleted

## 2014-09-13 NOTE — Telephone Encounter (Signed)
Red team, can you clarify with patient which med she is taking? Also she needs an appointment with me for her blood pressure. I last saw her in May 2015.

## 2014-09-13 NOTE — Telephone Encounter (Signed)
Received a refill request from [facility] for Chlorthalidone 25 mg tab; #30. Medication is listed as atenolol-chlorthalidone (TENORETIC) 100-25 MG per tablet.  Please advise.  Derl Barrow, RN

## 2014-09-15 NOTE — Telephone Encounter (Signed)
Spoke with patient, she states she is only taking the chlorthalidone 25mg . Patient states she will call back and schedule an appointment within the next couple weeks.

## 2014-09-16 MED ORDER — CHLORTHALIDONE 25 MG PO TABS: 25.0000 mg | ORAL_TABLET | Freq: Every day | ORAL | Status: DC

## 2014-09-16 NOTE — Telephone Encounter (Signed)
Rx sent in. Thanks! Gennesis Hogland J Estanislado Surgeon, MD  

## 2014-09-19 ENCOUNTER — Encounter (HOSPITAL_COMMUNITY): Payer: Self-pay | Admitting: Emergency Medicine

## 2014-09-19 ENCOUNTER — Emergency Department (HOSPITAL_COMMUNITY)
Admission: EM | Admit: 2014-09-19 | Discharge: 2014-09-19 | Disposition: A | Discharge status: Home / Self Care | Payer: BC Managed Care – PPO | Attending: Emergency Medicine | Admitting: Emergency Medicine

## 2014-09-19 ENCOUNTER — Emergency Department (HOSPITAL_COMMUNITY): Payer: BC Managed Care – PPO

## 2014-09-19 DIAGNOSIS — Z8719 Personal history of other diseases of the digestive system: Secondary | ICD-10-CM | POA: Diagnosis not present

## 2014-09-19 DIAGNOSIS — Z720 Tobacco use: Secondary | ICD-10-CM | POA: Insufficient documentation

## 2014-09-19 DIAGNOSIS — M1990 Unspecified osteoarthritis, unspecified site: Secondary | ICD-10-CM

## 2014-09-19 DIAGNOSIS — Z79899 Other long term (current) drug therapy: Secondary | ICD-10-CM | POA: Diagnosis not present

## 2014-09-19 DIAGNOSIS — E119 Type 2 diabetes mellitus without complications: Secondary | ICD-10-CM | POA: Diagnosis not present

## 2014-09-19 DIAGNOSIS — R7989 Other specified abnormal findings of blood chemistry: Secondary | ICD-10-CM | POA: Diagnosis not present

## 2014-09-19 DIAGNOSIS — M25512 Pain in left shoulder: Secondary | ICD-10-CM | POA: Diagnosis present

## 2014-09-19 DIAGNOSIS — E669 Obesity, unspecified: Secondary | ICD-10-CM | POA: Diagnosis not present

## 2014-09-19 DIAGNOSIS — R945 Abnormal results of liver function studies: Secondary | ICD-10-CM

## 2014-09-19 DIAGNOSIS — I10 Essential (primary) hypertension: Secondary | ICD-10-CM | POA: Diagnosis not present

## 2014-09-19 LAB — COMPREHENSIVE METABOLIC PANEL
ALK PHOS: 80 U/L (ref 39–117)
ALT: 80 U/L — ABNORMAL HIGH (ref 0–35)
AST: 76 U/L — AB (ref 0–37)
Albumin: 3.3 g/dL — ABNORMAL LOW (ref 3.5–5.2)
Anion gap: 9 (ref 5–15)
BUN: 14 mg/dL (ref 6–23)
CO2: 28 mmol/L (ref 19–32)
CREATININE: 0.71 mg/dL (ref 0.50–1.10)
Calcium: 8.7 mg/dL (ref 8.4–10.5)
Chloride: 101 mmol/L (ref 96–112)
GFR calc Af Amer: >90 mL/min (ref >90)
GFR calc non Af Amer: >90 mL/min (ref >90)
Glucose, Bld: 164 mg/dL — ABNORMAL HIGH (ref 70–99)
Potassium: 3.6 mmol/L (ref 3.5–5.1)
Sodium: 138 mmol/L (ref 135–145)
Total Bilirubin: 0.5 mg/dL (ref 0.3–1.2)
Total Protein: 7.5 g/dL (ref 6.0–8.3)

## 2014-09-19 LAB — CBC WITH DIFFERENTIAL/PLATELET
BASOS ABS: 0 10*3/uL (ref 0.0–0.1)
Basophils Relative: 0 % (ref 0–1)
EOS ABS: 0.1 10*3/uL (ref 0.0–0.7)
Eosinophils Relative: 1 % (ref 0–5)
HEMATOCRIT: 44.2 % (ref 36.0–46.0)
HEMOGLOBIN: 15 g/dL (ref 12.0–15.0)
LYMPHS PCT: 24 % (ref 12–46)
Lymphs Abs: 2.3 10*3/uL (ref 0.7–4.0)
MCH: 30.9 pg (ref 26.0–34.0)
MCHC: 33.9 g/dL (ref 30.0–36.0)
MCV: 90.9 fL (ref 78.0–100.0)
Monocytes Absolute: 0.5 10*3/uL (ref 0.1–1.0)
Monocytes Relative: 5 % (ref 3–12)
NEUTROS ABS: 6.5 10*3/uL (ref 1.7–7.7)
Neutrophils Relative %: 70 % (ref 43–77)
Platelets: 249 10*3/uL (ref 150–400)
RBC: 4.86 MIL/uL (ref 3.87–5.11)
RDW: 12.3 % (ref 11.5–15.5)
WBC: 9.4 10*3/uL (ref 4.0–10.5)

## 2014-09-19 LAB — I-STAT TROPONIN, ED: TROPONIN I, POC: 0.03 ng/mL (ref 0.00–0.08)

## 2014-09-19 IMAGING — CR DG SHOULDER 2+V*L*
3 series · 3 of 3 positions shown · non-contrast
Comparison: [DATE]

CLINICAL DATA: Left shoulder pain that is worse with movement.
Possible injury 2 weeks ago. Initial encounter.

EXAM:
LEFT SHOULDER - 2+ VIEW

[w shoulder external left]
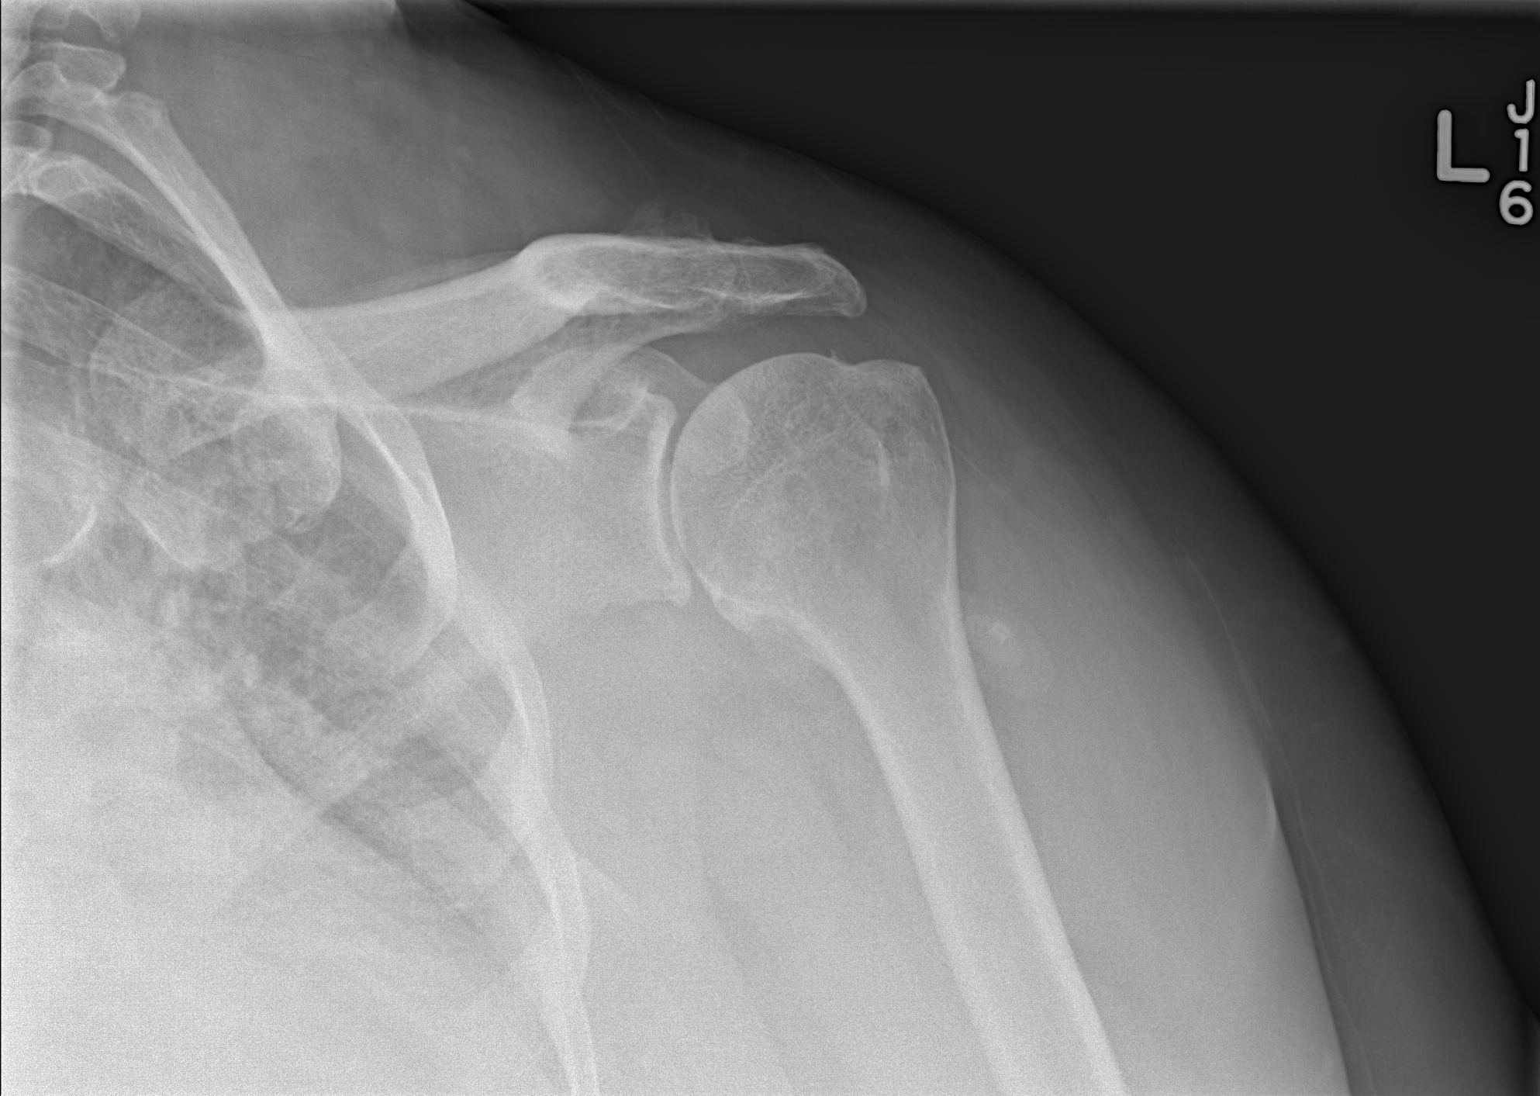

[w shoulder y-view left]
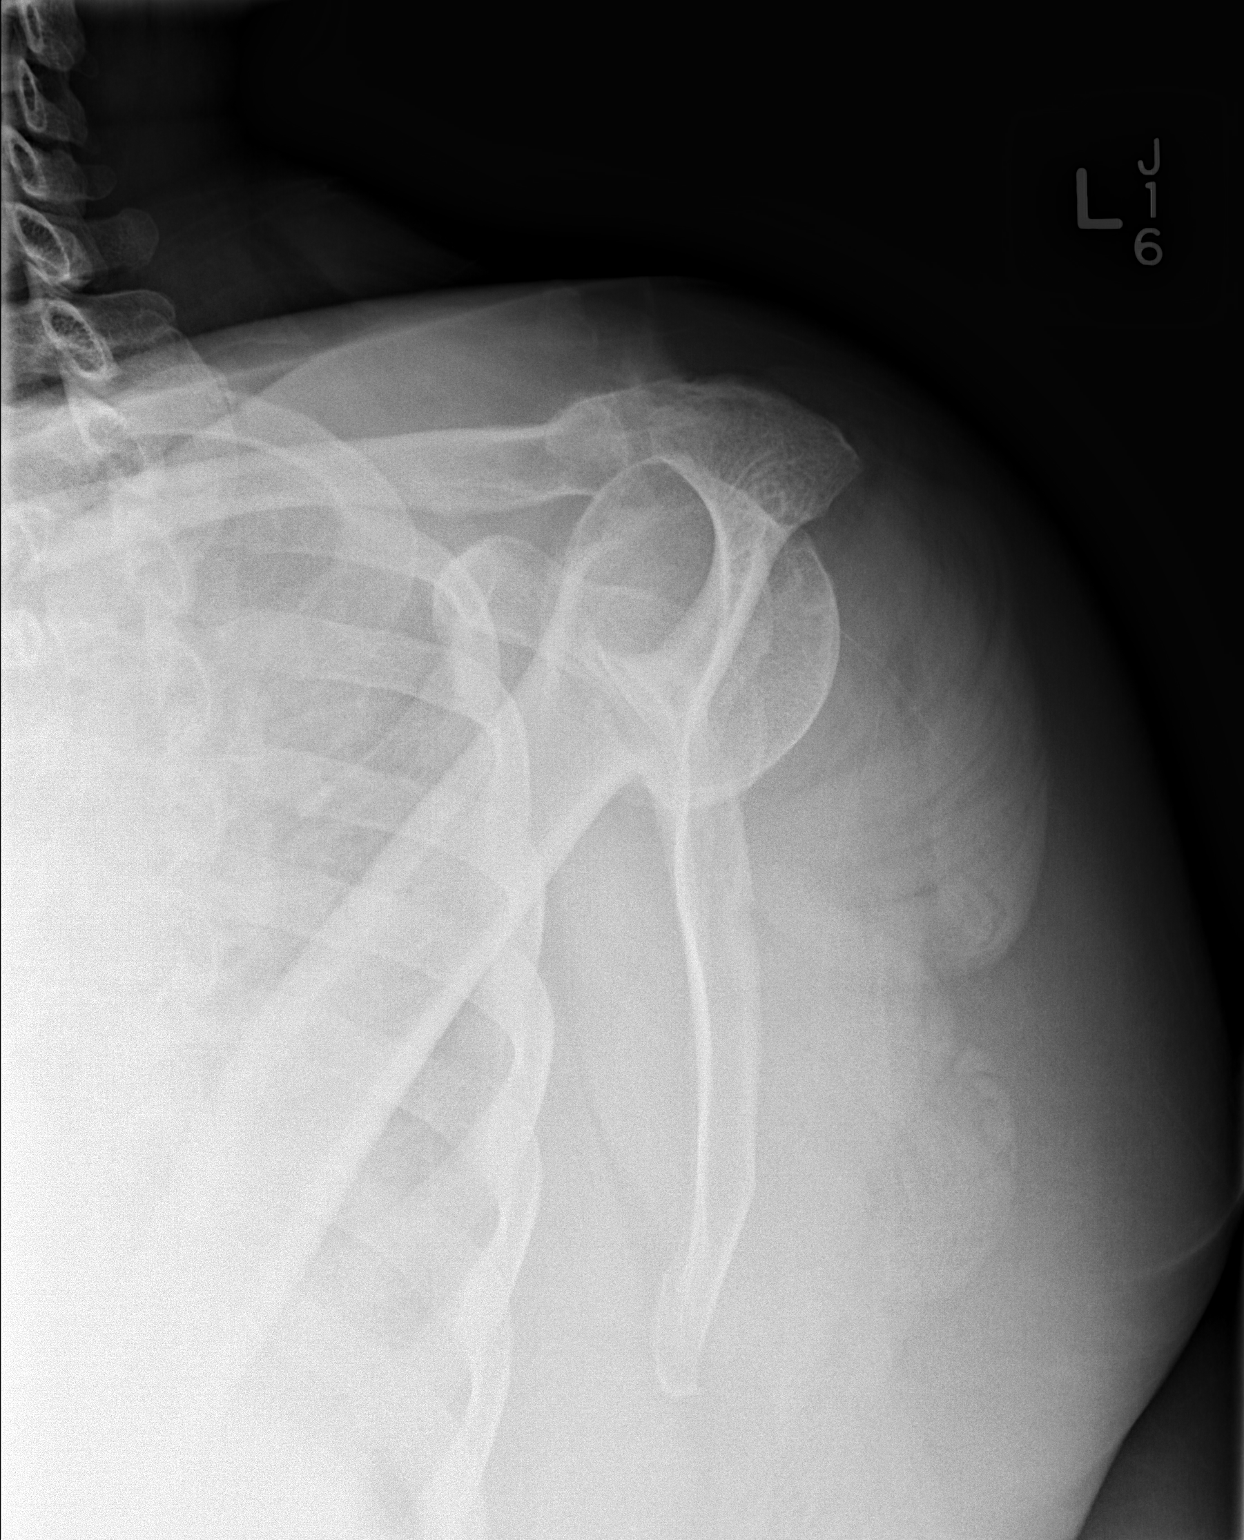

[x shoulder axillary left]
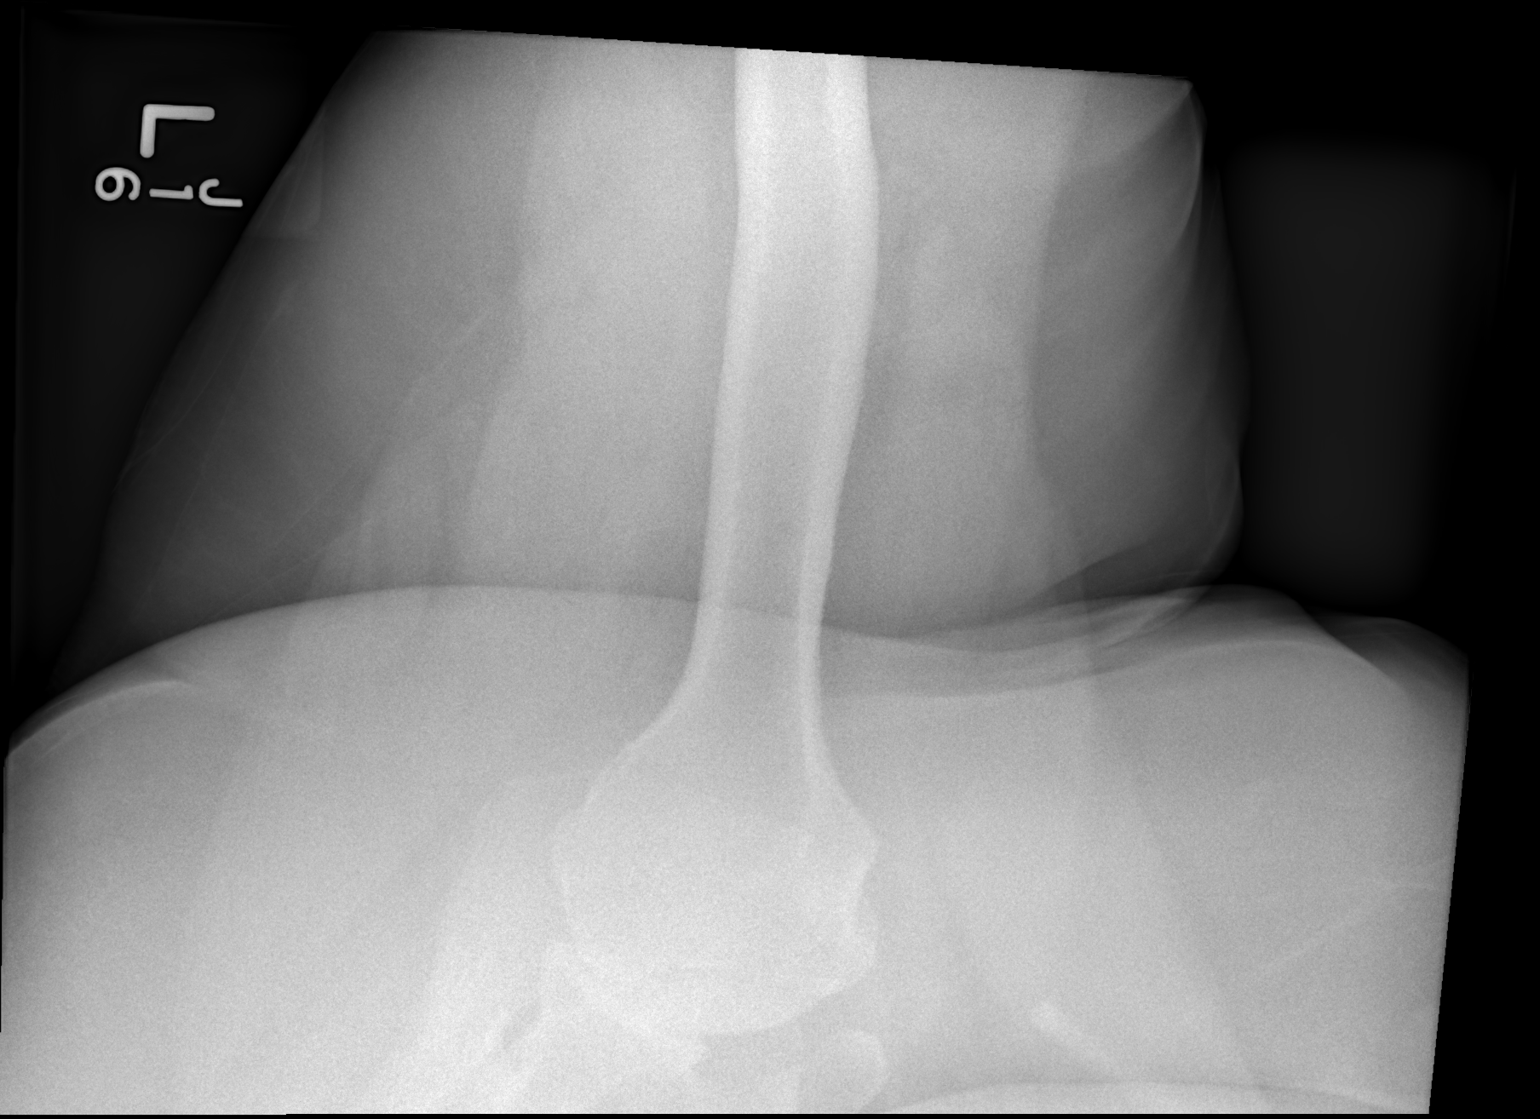

[3 of 3 positions shown; findings below may reference images not displayed]

FINDINGS: No acute fracture or dislocation.

There is narrowing and marginal osteophytes around the glenohumeral
joint consistent with osteoarthritis. The osteophytes have
progressed in size from [WU]. Mild acromioclavicular arthritis with
mainly superior marginal spurring.
IMPRESSION: 1. No acute osseous findings.
2. Glenohumeral osteoarthritis, progressed since [DATE].

## 2014-09-19 IMAGING — CR DG CHEST 2V
2 series · 2 of 2 positions shown · non-contrast
Comparison: PA and lateral chest [DATE].

CLINICAL DATA: Cough, cough, congestion and shortness of breath.
Left chest pain. Symptoms for 2 weeks.

EXAM:
CHEST  2 VIEW

[w chest pa]
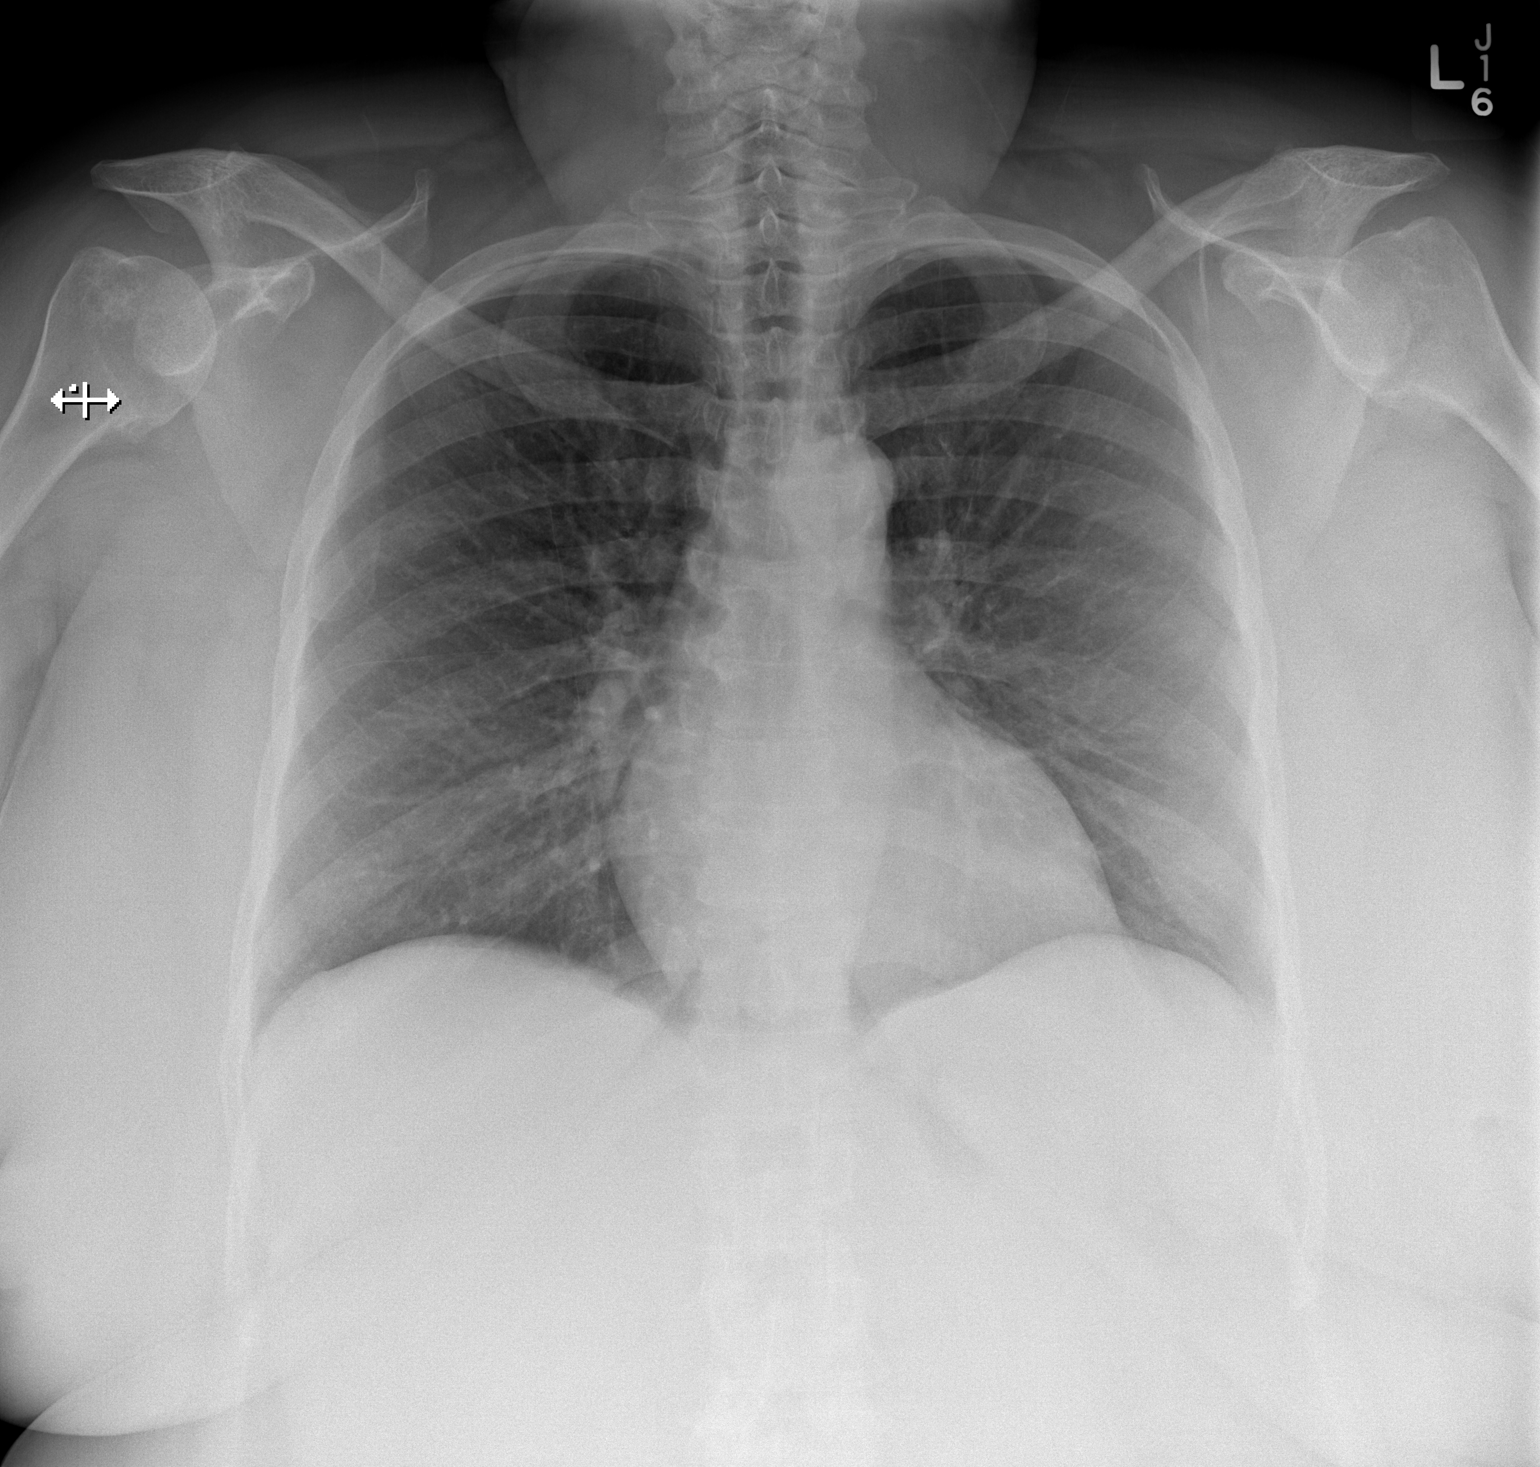

[w chest lat]
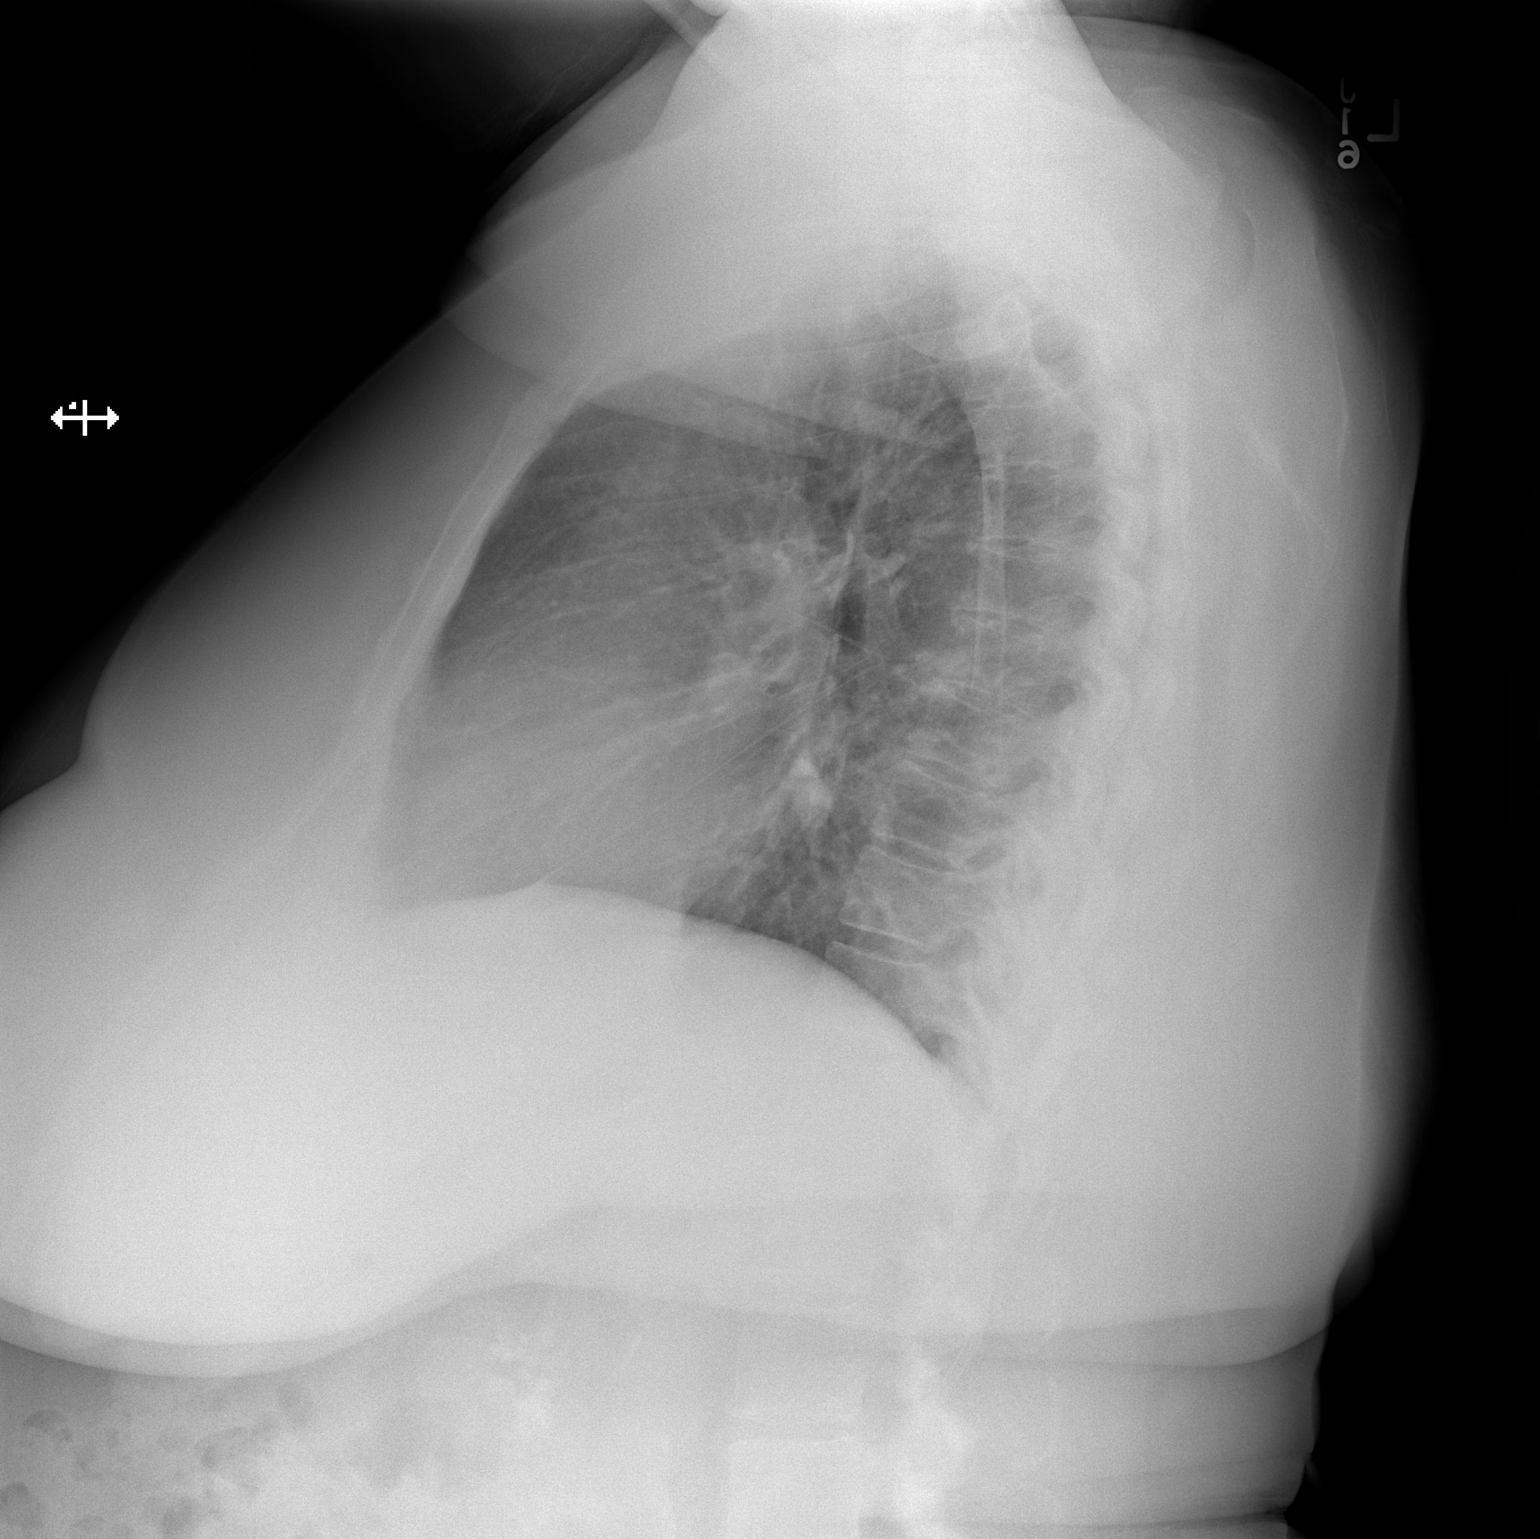

[2 of 2 positions shown; findings below may reference images not displayed]

FINDINGS: The lungs are clear. Heart size is normal. No pneumothorax or
pleural effusion. No focal bony abnormality.
IMPRESSION: Negative chest.

## 2014-09-19 MED ORDER — IBUPROFEN 600 MG PO TABS: 600.0000 mg | ORAL_TABLET | Freq: Four times a day (QID) | ORAL | Status: DC | PRN

## 2014-09-19 MED ORDER — HYDROCODONE-ACETAMINOPHEN 5-325 MG PO TABS: 2.0000 | ORAL_TABLET | Freq: Four times a day (QID) | ORAL | Status: DC | PRN

## 2014-09-19 MED ORDER — IBUPROFEN 800 MG PO TABS
800.0000 mg | ORAL_TABLET | Freq: Once | ORAL | Status: AC
  Administered 2014-09-19: 800 mg via ORAL
  Filled 2014-09-19: qty 1

## 2014-09-19 NOTE — Discharge Instructions (Signed)
Take motrin for pain.   Take vicodin for severe pain.   See orthopedic doctor.   Light duty for a week.   Your liver enzymes slightly elevated. See your doctor.   Return to ER if you have severe pain, chest pain.

## 2014-09-19 NOTE — ED Provider Notes (Signed)
CSN: [id number]     Arrival date & time 09/19/14  2440 History   First MD Initiated Contact with Patient 09/19/14 1019     Chief Complaint  Patient presents with  . Shoulder Pain     (Consider location/radiation/quality/duration/timing/severity/associated sxs/prior Treatment) The history is provided by the patient.  Melanie Alvarez is a 59 y.o. female hx of HTN, DM, hep C here with left shoulder pain, chest pain. She pushes heavy carts at work. For the last week and a half she has been noticing progressively worsening left shoulder pain that radiated to her left chest. Pain is constant and worse with movement. Denies any shortness of breath. Denies any fall or injury. Denies any numbness or weakness. No previous history of CAD. She does have history of diabetes, hypertension, obesity, and is a smoker.    Past Medical History  Diagnosis Date  . Hypertension 2011  . Diabetes mellitus 2012  . Hepatitis 2010    history of Hepatitis C  . Obesity    Past Surgical History  Procedure Laterality Date  . Dental surgery tooth extraction    . Colonoscopy  07/24/2011    Procedure: COLONOSCOPY;  Surgeon: Landry Dyke, MD;  Location: [facility];  Service: Endoscopy;  Laterality: N/A;   Family History  Problem Relation Age of Onset  . Anesthesia problems Neg Hx   . Diabetes Sister   . Cancer Maternal Grandmother     leukemia   . Diabetes Maternal Grandfather   . Diabetes Paternal Grandfather   . Diabetes Paternal Grandmother   . Hypertension Sister   . Hypertension Brother    History  Substance Use Topics  . Smoking status: Current Every Day Smoker -- 0.50 packs/day    Types: Cigarettes  . Smokeless tobacco: Never Used  . Alcohol Use: 18.0 oz/week    24 Cans of beer, 6 Shots of liquor per week     Comment: 2 beer/day   OB History    Gravida Para Term Preterm AB TAB SAB Ectopic Multiple Living   3 1 1  2  2   1      Review of Systems  Cardiovascular: Positive for chest  pain.  Musculoskeletal:       L shoulder pain   All other systems reviewed and are negative.     Allergies  Review of patient's allergies indicates no known allergies.  Home Medications   Prior to Admission medications   Medication Sig Start Date End Date Taking? Authorizing Provider  atenolol-chlorthalidone (TENORETIC) 100-25 MG per tablet Take 1 tablet by mouth daily. 12/20/13   Leeanne Rio, MD  chlorthalidone (HYGROTON) 25 MG tablet Take 1 tablet (25 mg total) by mouth daily. 09/16/14   Leeanne Rio, MD  Cholecalciferol (VITAMIN D PO) Take by mouth.    Historical Provider, MD  Cyanocobalamin (VITAMIN B-12 PO) Take by mouth.    Historical Provider, MD  lisinopril (PRINIVIL,ZESTRIL) 40 MG tablet TAKE ONE TABLET BY MOUTH ONCE DAILY 08/05/14   Leeanne Rio, MD  Omega-3 Fatty Acids (FISH OIL PO) Take by mouth.    Historical Provider, MD   BP 157/90 mmHg  Pulse 92  Temp(Src) 97.8 F (36.6 C) (Oral)  Resp 18  SpO2 96% Physical Exam  Constitutional: She is oriented to person, place, and time.  Slightly uncomfortable   HENT:  Head: Normocephalic.  Mouth/Throat: Oropharynx is clear and moist.  Eyes: Conjunctivae are normal. Pupils are equal, round, and reactive to light.  Neck: Normal range of motion. Neck supple.  Cardiovascular: Normal rate, regular rhythm and normal heart sounds.   Pulmonary/Chest: Effort normal and breath sounds normal. No respiratory distress. She has no wheezes. She has no rales.  Reproducible L chest tenderness   Abdominal: Soft. Bowel sounds are normal. She exhibits no distension. There is no tenderness. There is no rebound.  Musculoskeletal:  Tenderness L supraspinatus. + Neer sign. No obvious deformity or dislocation. 2+ pulses. Nl strength throughout   Neurological: She is alert and oriented to person, place, and time. No cranial nerve deficit. Coordination normal.  Skin: Skin is warm and dry.  Psychiatric: She has a normal mood and  affect. Her behavior is normal. Judgment and thought content normal.  Nursing note and vitals reviewed.   ED Course  Procedures (including critical care time) Labs Review Labs Reviewed  COMPREHENSIVE METABOLIC PANEL - Abnormal; Notable for the following:    Glucose, Bld 164 (*)    Albumin 3.3 (*)    AST 76 (*)    ALT 80 (*)    All other components within normal limits  CBC WITH DIFFERENTIAL/PLATELET  Randolm Idol, ED    Imaging Review Dg Chest 2 View  09/19/2014   CLINICAL DATA:  Cough, cough, congestion and shortness of breath. Left chest pain. Symptoms for 2 weeks.  EXAM: CHEST  2 VIEW  COMPARISON:  PA and lateral chest 03/23/2007.  FINDINGS: The lungs are clear. Heart size is normal. No pneumothorax or pleural effusion. No focal bony abnormality.  IMPRESSION: Negative chest.   Electronically Signed   By: Inge Rise M.D.   On: 09/19/2014 11:18   Dg Shoulder Left  09/19/2014   CLINICAL DATA:  Left shoulder pain that is worse with movement. Possible injury 2 weeks ago. Initial encounter.  EXAM: LEFT SHOULDER - 2+ VIEW  COMPARISON:  03/23/2007  FINDINGS: No acute fracture or dislocation.  There is narrowing and marginal osteophytes around the glenohumeral joint consistent with osteoarthritis. The osteophytes have progressed in size from 2008. Mild acromioclavicular arthritis with mainly superior marginal spurring.  IMPRESSION: 1. No acute osseous findings. 2. Glenohumeral osteoarthritis, progressed since 2008.   Electronically Signed   By: Jorje Guild M.D.   On: 09/19/2014 11:20     EKG Interpretation None      MDM   Final diagnoses:  Shoulder pain, acute, left    Melanie Alvarez is a 59 y.o. female here with L shoulder pain and chest pain. L shoulder pain likely rotator cuff injury. Chest pain reproducible, likely MSK from shoulder pain. Pain has been going on for 1.5 weeks so trop x 1 sufficient. I doubt PE or dissection.   11:49 AM  xray showed arthritis. LFTs  slightly elevated but has no abdominal pain. Has Hep C in the past. Will dc home with vicodin, motrin, ortho f/u.    Wandra Arthurs, MD 09/19/14 1150

## 2014-09-19 NOTE — ED Notes (Signed)
Pt from home c/o left shoulder pain x two weeks that radiates to left chest. 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

## 2014-11-04 ENCOUNTER — Other Ambulatory Visit: Payer: Self-pay | Admitting: Family Medicine

## 2014-11-04 ENCOUNTER — Telehealth: Payer: Self-pay | Admitting: Family Medicine

## 2014-11-04 NOTE — Telephone Encounter (Signed)
To [facility] red team - please call pt and let her know I have refilled her BP med but she needs to schedule an appointment to follow up on her BP. Thanks!  Leeanne Rio, MD

## 2014-11-07 NOTE — Telephone Encounter (Signed)
LMOVM for pt to call us back. Anyely Cunning, CMA  

## 2014-11-08 NOTE — Telephone Encounter (Signed)
Left message on voicemail for patient to call back. 

## 2014-12-05 ENCOUNTER — Encounter: Payer: Self-pay | Admitting: Family Medicine

## 2014-12-05 ENCOUNTER — Other Ambulatory Visit: Payer: Self-pay | Admitting: Family Medicine

## 2015-05-22 ENCOUNTER — Other Ambulatory Visit: Payer: Self-pay | Admitting: Family Medicine

## 2015-05-22 NOTE — Telephone Encounter (Signed)
Prescription given for thirty days. Please follow up in clinic for further refills.

## 2015-05-24 NOTE — Telephone Encounter (Signed)
LVm for pt to call back to inform her that her Rx was filled and that she would need an appointment for further refills. Katharina Caper, Zeev Deakins D, [place]

## 2015-05-25 NOTE — Telephone Encounter (Signed)
LMOVM for callback. . Melanie Alvarez Dawn  

## 2015-07-31 ENCOUNTER — Other Ambulatory Visit: Payer: Self-pay

## 2015-07-31 DIAGNOSIS — Z1231 Encounter for screening mammogram for malignant neoplasm of breast: Secondary | ICD-10-CM

## 2015-09-11 ENCOUNTER — Ambulatory Visit: Payer: BC Managed Care – PPO

## 2015-09-20 ENCOUNTER — Ambulatory Visit: Payer: BC Managed Care – PPO

## 2015-10-05 ENCOUNTER — Ambulatory Visit: Payer: BC Managed Care – PPO

## 2015-10-16 ENCOUNTER — Ambulatory Visit
Admission: RE | Admit: 2015-10-16 | Discharge: 2015-10-16 | Disposition: A | Discharge status: Home / Self Care | Payer: No Typology Code available for payment source | Source: Ambulatory Visit

## 2015-10-16 DIAGNOSIS — Z1231 Encounter for screening mammogram for malignant neoplasm of breast: Secondary | ICD-10-CM

## 2015-10-16 IMAGING — MG MM SCREEN MAMMOGRAM BILATERAL
6 series · 6 of 6 positions shown · non-contrast
Comparison: Previous exam(s).

CLINICAL DATA: Screening.

EXAM:
DIGITAL SCREENING BILATERAL MAMMOGRAM WITH CAD

[R CC (1 of 2)]
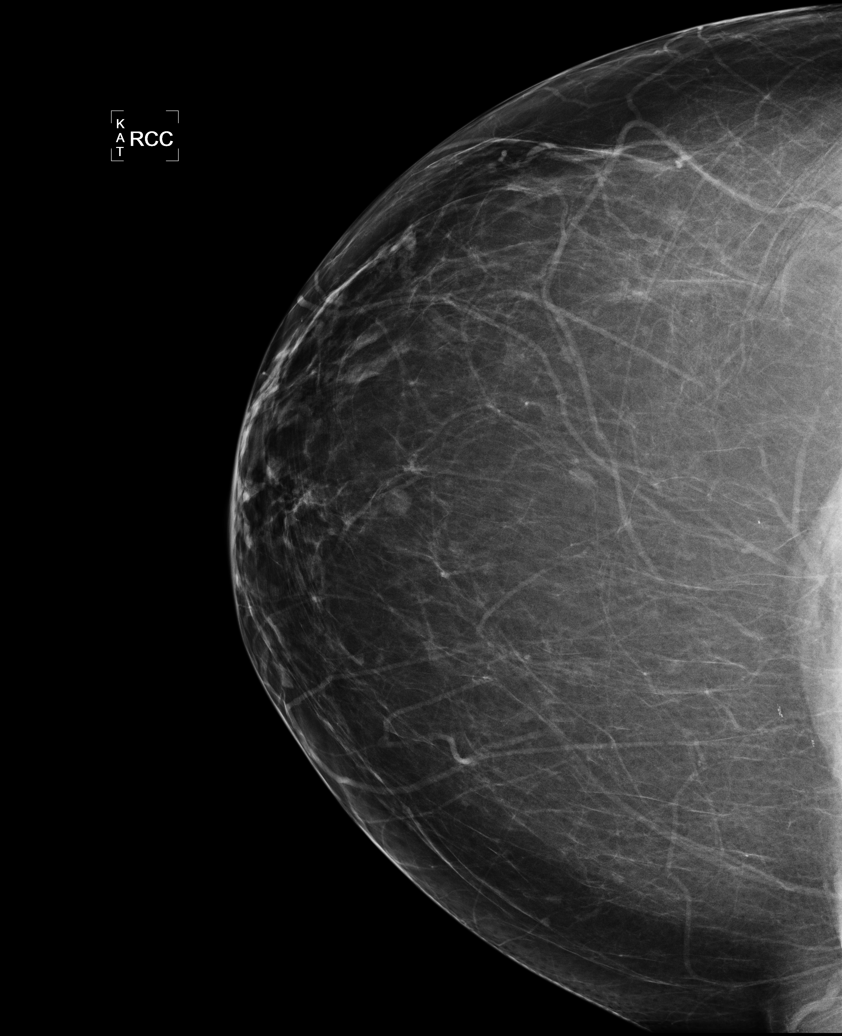

[L CC (1 of 2)]
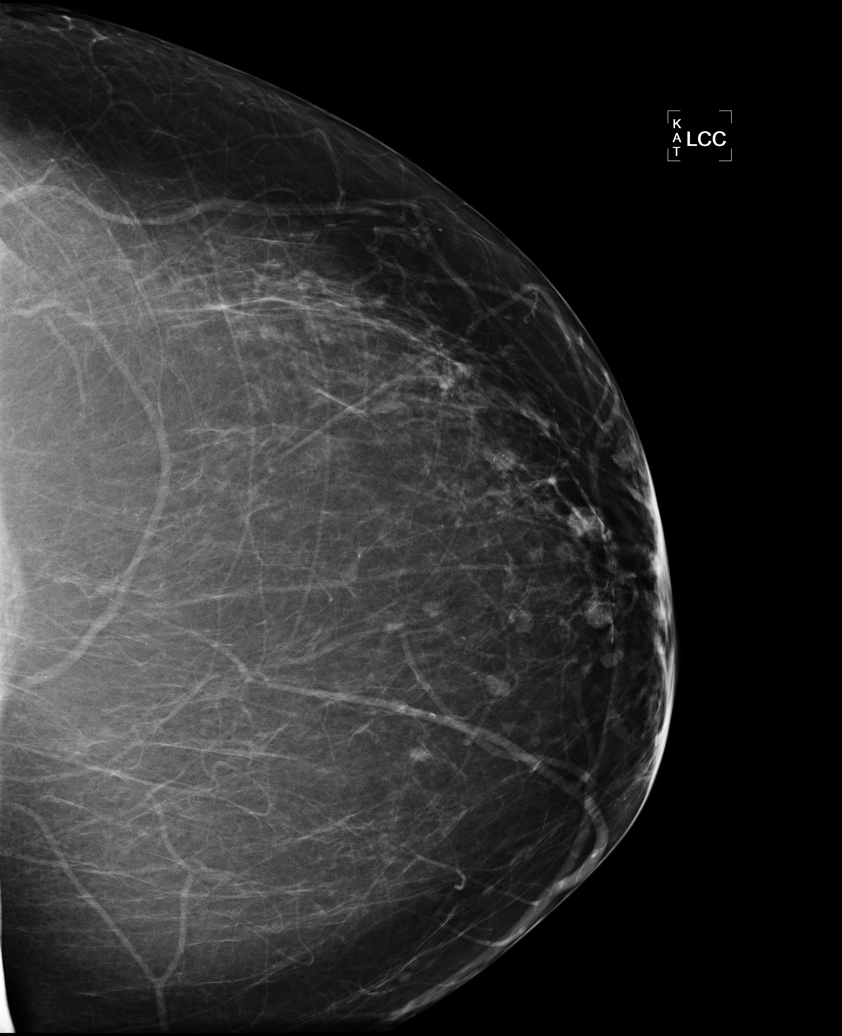

[L MLO]
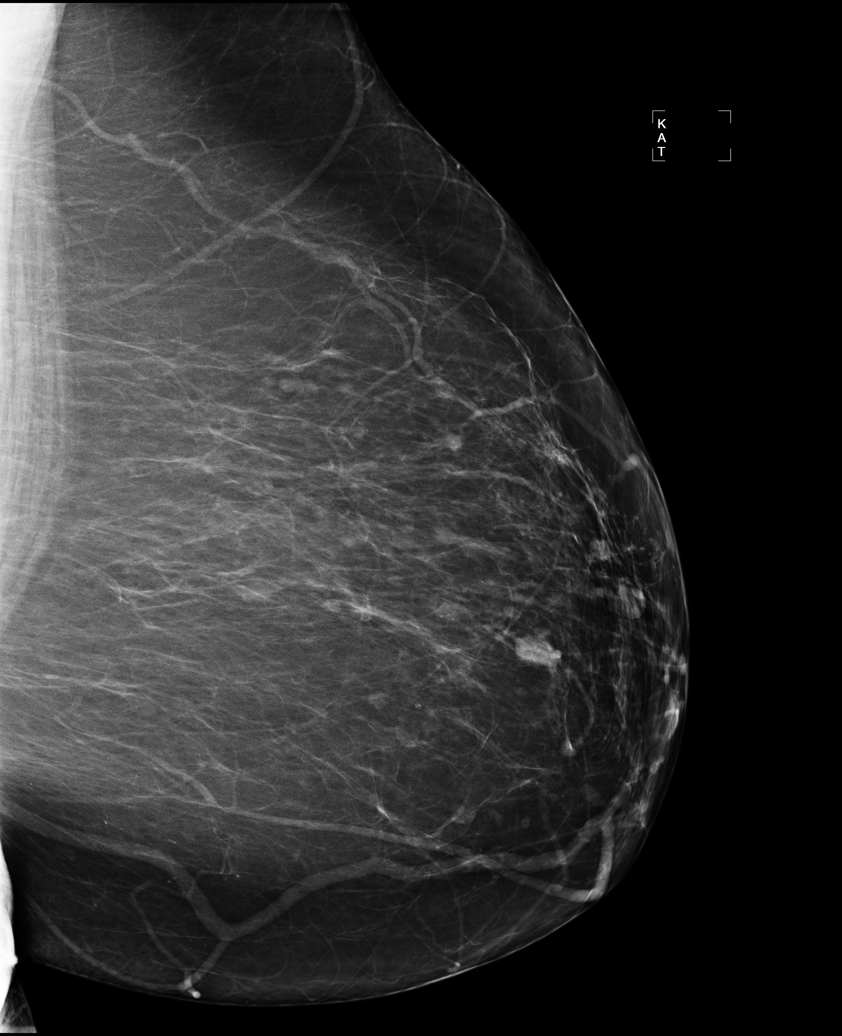

[R MLO]
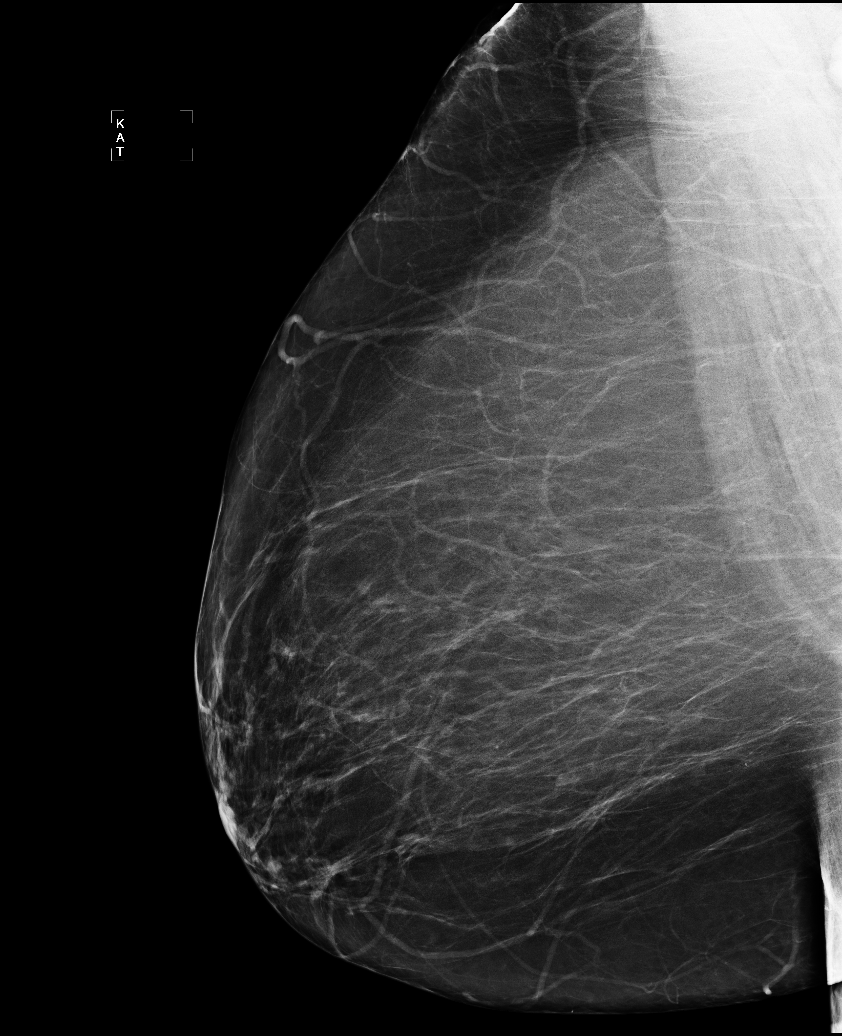

[R CC (2 of 2)]
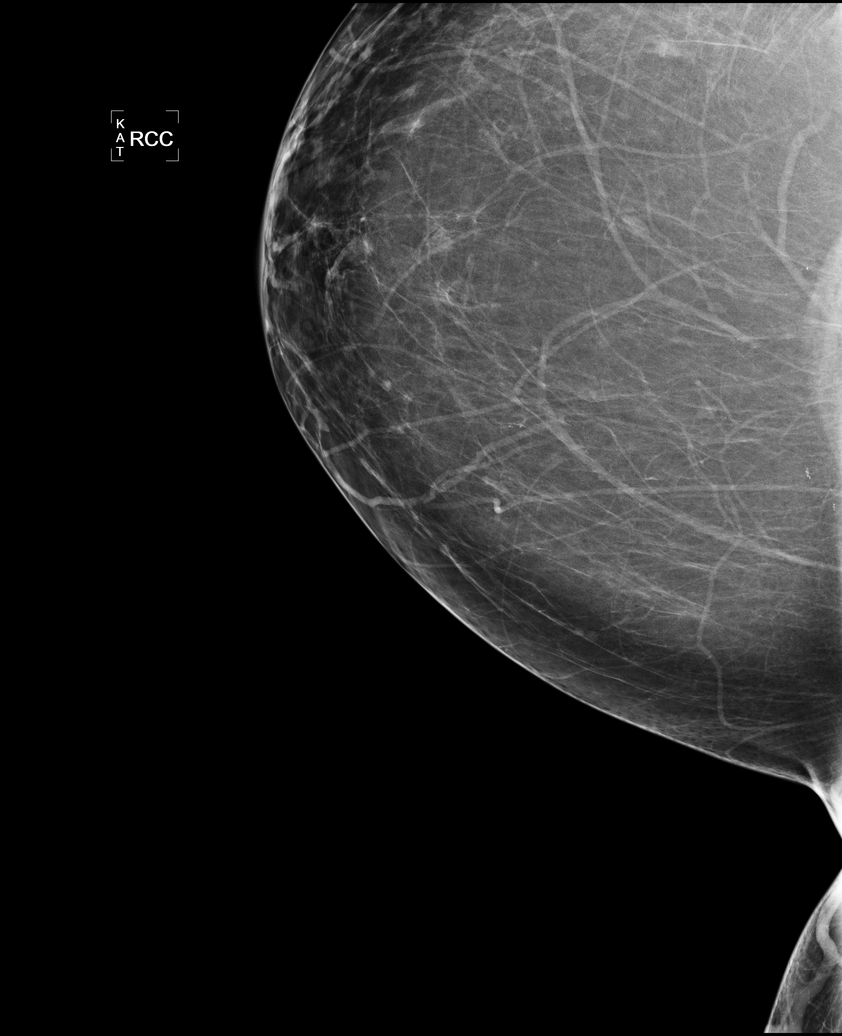

[L CC (2 of 2)]
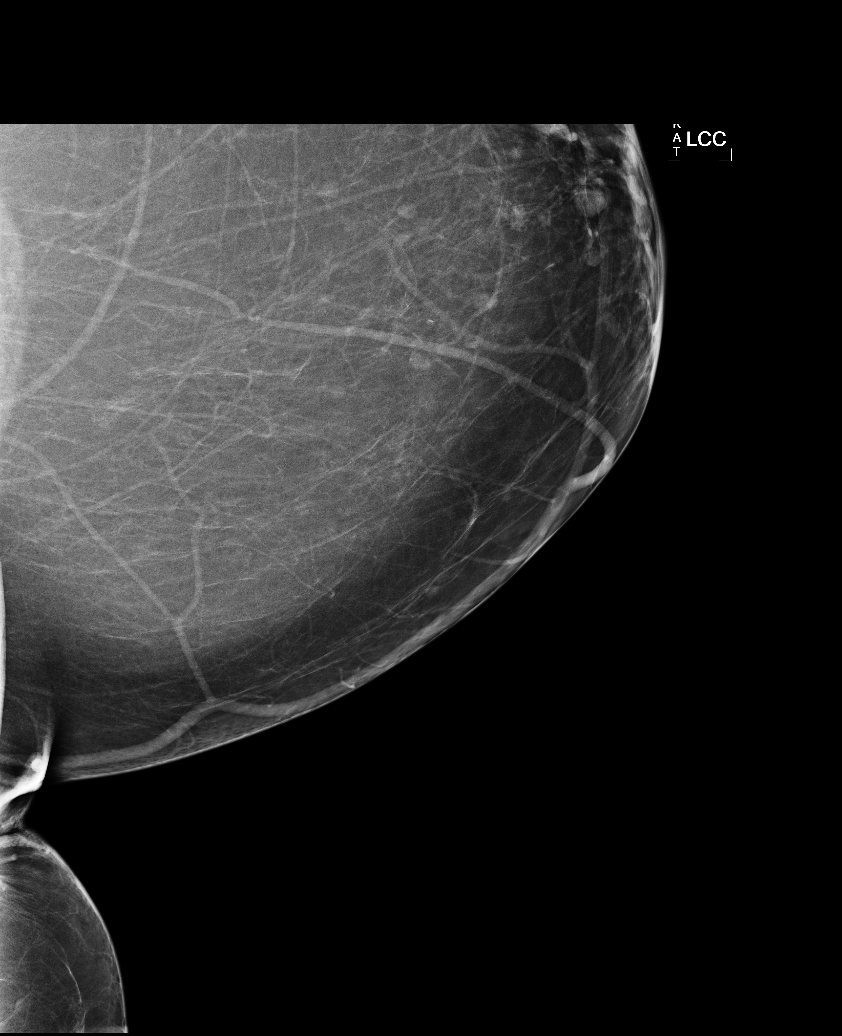

[6 of 6 positions shown; findings below may reference images not displayed]

ACR Breast Density Category b: There are scattered areas of
fibroglandular density.
FINDINGS: There are no findings suspicious for malignancy. Images were
processed with CAD.
IMPRESSION: No mammographic evidence of malignancy. A result letter of this
screening mammogram will be mailed directly to the patient.

RECOMMENDATION:
Screening mammogram in one year. (Code:[US])

BI-RADS CATEGORY  1: Negative.

## 2016-06-17 ENCOUNTER — Ambulatory Visit: Payer: No Typology Code available for payment source | Admitting: Nurse Practitioner

## 2016-07-22 ENCOUNTER — Encounter: Payer: Self-pay | Admitting: Nurse Practitioner

## 2016-07-22 ENCOUNTER — Ambulatory Visit (INDEPENDENT_AMBULATORY_CARE_PROVIDER_SITE_OTHER): Payer: Self-pay | Admitting: Nurse Practitioner

## 2016-07-22 VITALS — BP 150/92 | HR 92 | Temp 98.3°F | Ht 63.0 in | Wt 250.0 lb

## 2016-07-22 DIAGNOSIS — M542 Cervicalgia: Secondary | ICD-10-CM

## 2016-07-22 DIAGNOSIS — B1920 Unspecified viral hepatitis C without hepatic coma: Secondary | ICD-10-CM

## 2016-07-22 MED ORDER — NAPROXEN SODIUM 220 MG PO CAPS: 1.0000 | ORAL_CAPSULE | Freq: Two times a day (BID) | ORAL | 0 refills | Status: DC | PRN

## 2016-07-22 NOTE — Patient Instructions (Addendum)
Cervical Sprain A cervical sprain is a stretch or tear in the tissues that connect bones (ligaments) in the neck. Most neck (cervical) sprains get better in 4-6 weeks. Follow these instructions at home: If you have a neck collar:  Wear it as told by your doctor. Do not take off (do not remove) the collar unless your doctor says that this is safe.  Ask your doctor before adjusting your collar.  If you have long hair, keep it outside of the collar.  Ask your doctor if you may take off the collar for cleaning and bathing. If you may take off the collar:  Follow instructions from your doctor about how to take off the collar safely.  Clean the collar by wiping it with mild soap and water. Let it air-dry all the way.  If your collar has removable pads:  Take the pads out every 1-2 days.  Hand wash the pads with soap and water.  Let the pads air-dry all the way before you put them back in the collar. Do not dry them in a clothes dryer. Do not dry them with a hair dryer.  Check your skin under the collar for irritation or sores. If you see any, tell your doctor. Managing pain, stiffness, and swelling  Use a cervical traction device, if told by your doctor.  If told, put heat on the affected area. Do this before exercises (physical therapy) or as often as told by your doctor. Use the heat source that your doctor recommends, such as a moist heat pack or a heating pad.  Place a towel between your skin and the heat source.  Leave the heat on for 20-30 minutes.  Take the heat off (remove the heat) if your skin turns bright red. This is very important if you cannot feel pain, heat, or cold. You may have a greater risk of getting burned.  Put ice on the affected area.  Put ice in a plastic bag.  Place a towel between your skin and the bag.  Leave the ice on for 20 minutes, 2-3 times a day. Activity  Do not drive while wearing a neck collar. If you do not have a neck collar, ask your  doctor if it is safe to drive.  Do not drive or use heavy machinery while taking prescription pain medicine or muscle relaxants, unless your doctor approves.  Do not lift anything that is heavier than 10 lb (4.5 kg) until your doctor tells you that it is safe.  Rest as told by your doctor.  Avoid activities that make you feel worse. Ask your doctor what activities are safe for you.  Do exercises as told by your doctor or physical therapist. Preventing neck sprain  Practice good posture. Adjust your workstation to help with this, if needed.  Exercise regularly as told by your doctor or physical therapist.  Avoid activities that are risky or may cause a neck sprain (cervical sprain). General instructions  Take over-the-counter and prescription medicines only as told by your doctor.  Do not use any products that contain nicotine or tobacco. This includes cigarettes and e-cigarettes. If you need help quitting, ask your doctor.  Keep all follow-up visits as told by your doctor. This is important. Contact a doctor if:  You have pain or other symptoms that get worse.  You have symptoms that do not get better after 2 weeks.  You have pain that does not get better with medicine.  You start to have new,   unexplained symptoms.  You have sores or irritated skin from wearing your neck collar. Get help right away if:  You have very bad pain.  You have any of the following in any part of your body: ? Loss of feeling (numbness). ? Tingling. ? Weakness.  You cannot move a part of your body (you have paralysis).  Your activity level does not improve. Summary  A cervical sprain is a stretch or tear in the tissues that connect bones (ligaments) in the neck.  If you have a neck (cervical) collar, do not take off the collar unless your doctor says that this is safe.  Put ice on affected areas as told by your doctor.  Put heat on affected areas as told by your doctor.  Good posture  and regular exercise can help prevent a neck sprain from happening again. This information is not intended to replace advice given to you by your health care provider. Make sure you discuss any questions you have with your health care provider. Document Released: 01/22/2008 Document Revised: 04/16/2016 Document Reviewed: 04/16/2016 Elsevier Interactive Patient Education  2017 [employer]   Neck Exercises Neck exercises can be important for many reasons:  They can help you to improve and maintain flexibility in your neck. This can be especially important as you age.  They can help to make your neck stronger. This can make movement easier.  They can reduce or prevent neck pain.  They may help your upper back.  Ask your health care provider which neck exercises would be best for you. Exercises Neck Press Repeat this exercise 10 times. Do it first thing in the morning and right before bed or as told by your health care provider. 1. Lie on your back on a firm bed or on the floor with a pillow under your head. 2. Use your neck muscles to push your head down on the pillow and straighten your spine. 3. Hold the position as well as you can. Keep your head facing up and your chin tucked. 4. Slowly count to 5 while holding this position. 5. Relax for a few seconds. Then repeat.  Isometric Strengthening Do a full set of these exercises 2 times a day or as told by your health care provider. 1. Sit in a supportive chair and place your hand on your forehead. 2. Push forward with your head and neck while pushing back with your hand. Hold for 10 seconds. 3. Relax. Then repeat the exercise 3 times. 4. Next, do thesequence again, this time putting your hand against the back of your head. Use your head and neck to push backward against the hand pressure. 5. Finally, do the same exercise on either side of your head, pushing sideways against the pressure of your hand.  Prone Head Lifts Repeat this  exercise 5 times. Do this 2 times a day or as told by your health care provider. 1. Lie face-down, resting on your elbows so that your chest and upper back are raised. 2. Start with your head facing downward, near your chest. Position your chin either on or near your chest. 3. Slowly lift your head upward. Lift until you are looking straight ahead. Then continue lifting your head as far back as you can stretch. 4. Hold your head up for 5 seconds. Then slowly lower it to your starting position.  Supine Head Lifts Repeat this exercise 8-10 times. Do this 2 times a day or as told by your health care provider. 1. Lie   on your back, bending your knees to point to the ceiling and keeping your feet flat on the floor. 2. Lift your head slowly off the floor, raising your chin toward your chest. 3. Hold for 5 seconds. 4. Relax and repeat.  Scapular Retraction Repeat this exercise 5 times. Do this 2 times a day or as told by your health care provider. 1. Stand with your arms at your sides. Look straight ahead. 2. Slowly pull both shoulders backward and downward until you feel a stretch between your shoulder blades in your upper back. 3. Hold for 10-30 seconds. 4. Relax and repeat.  Contact a health care provider if:  Your neck pain or discomfort gets much worse when you do an exercise.  Your neck pain or discomfort does not improve within 2 hours after you exercise. If you have any of these problems, stop exercising right away. Do not do the exercises again unless your health care provider says that you can. Get help right away if:  You develop sudden, severe neck pain. If this happens, stop exercising right away. Do not do the exercises again unless your health care provider says that you can. Exercises Neck Stretch  Repeat this exercise 3-5 times. 1. Do this exercise while standing or while sitting in a chair. 2. Place your feet flat on the floor, shoulder-width apart. 3. Slowly turn your  head to the right. Turn it all the way to the right so you can look over your right shoulder. Do not tilt or tip your head. 4. Hold this position for 10-30 seconds. 5. Slowly turn your head to the left, to look over your left shoulder. 6. Hold this position for 10-30 seconds.  Neck Retraction Repeat this exercise 8-10 times. Do this 3-4 times a day or as told by your health care provider. 1. Do this exercise while standing or while sitting in a sturdy chair. 2. Look straight ahead. Do not bend your neck. 3. Use your fingers to push your chin backward. Do not bend your neck for this movement. Continue to face straight ahead. If you are doing the exercise properly, you will feel a slight sensation in your throat and a stretch at the back of your neck. 4. Hold the stretch for 1-2 seconds. Relax and repeat.  This information is not intended to replace advice given to you by your health care provider. Make sure you discuss any questions you have with your health care provider. Document Released: 07/17/2015 Document Revised: 01/11/2016 Document Reviewed: 02/13/2015 Elsevier Interactive Patient Education  2017 [employer]  

## 2016-07-22 NOTE — Progress Notes (Signed)
Subjective:  Patient ID: Melanie Alvarez, female    DOB: 09-21-1955  Age: 60 y.o. MRN: [id number]  CC: Establish Care (establish care only/trouble w/shoulders/neck when cold(pressure & tendsion). hx of arthritis. pt report positive Hep C ,but never start any med for this. )   Neck Pain   This is a chronic problem. The current episode started more than 1 year ago. The problem occurs intermittently. The problem has been waxing and waning. The pain is associated with an unknown factor. The pain is present in the occipital region and left side. The quality of the pain is described as aching and cramping. The symptoms are aggravated by twisting and stress. Pertinent negatives include no fever, headaches, leg pain, numbness, pain with swallowing, photophobia, tingling, trouble swallowing, visual change, weakness or weight loss. She has tried NSAIDs for the symptoms. The treatment provided significant relief.   She states her insurance coverage starts 08/19/16, therefore she will like to schedule CPE after that date. Does not need refills at this time.  Outpatient Medications Prior to Visit  Medication Sig Dispense Refill  . cholecalciferol (VITAMIN D) 400 UNITS TABS tablet Take 400 Units by mouth daily.    Marland Kitchen ibuprofen (ADVIL,MOTRIN) 600 MG tablet Take 1 tablet (600 mg total) by mouth every 6 (six) hours as needed. 30 tablet 0  . milk thistle 175 MG tablet Take 175 mg by mouth daily.    . vitamin B-12 (CYANOCOBALAMIN) 500 MCG tablet Take 500 mcg by mouth daily.    Marland Kitchen atenolol-chlorthalidone (TENORETIC) 100-25 MG per tablet Take 1 tablet by mouth daily. (Patient not taking: Reported on 07/22/2016) 90 tablet 0  . chlorthalidone (HYGROTON) 25 MG tablet TAKE ONE TABLET BY MOUTH ONCE DAILY (Patient not taking: Reported on 07/22/2016) 30 tablet 0  . HYDROcodone-acetaminophen (NORCO/VICODIN) 5-325 MG per tablet Take 2 tablets by mouth every 6 (six) hours as needed for moderate pain or severe pain. (Patient  not taking: Reported on 07/22/2016) 15 tablet 0  . lisinopril (PRINIVIL,ZESTRIL) 40 MG tablet TAKE ONE TABLET BY MOUTH ONCE DAILY **APPOINTMENT NEEDED** (Patient not taking: Reported on 07/22/2016) 30 tablet 0   No facility-administered medications prior to visit.     ROS See HPI  Objective:  BP (!) 150/92   Pulse 92   Temp 98.3 F (36.8 C)   Ht 5\' 3"  (1.6 m)   Wt 250 lb (113.4 kg)   SpO2 97%   BMI 44.29 kg/m   BP Readings from Last 3 Encounters:  07/22/16 (!) 150/92  09/19/14 157/90  12/20/13 139/84    Wt Readings from Last 3 Encounters:  07/22/16 250 lb (113.4 kg)  12/20/13 249 lb (112.9 kg)  05/10/13 247 lb 1.6 oz (112.1 kg)    Physical Exam  Constitutional: She is oriented to person, place, and time. No distress.  HENT:  Right Ear: External ear normal.  Left Ear: External ear normal.  Nose: Nose normal.  Mouth/Throat: No oropharyngeal exudate.  Eyes: No scleral icterus.  Neck: Normal range of motion. Neck supple.  Cardiovascular: Normal rate, regular rhythm and normal heart sounds.   Pulmonary/Chest: Effort normal and breath sounds normal. No respiratory distress.  Abdominal: Soft. She exhibits no distension.  Musculoskeletal: Normal range of motion. She exhibits no edema.       Right shoulder: Normal.       Left shoulder: Normal.       Cervical back: Normal.  Lymphadenopathy:    She has no cervical adenopathy.  Neurological: She  is alert and oriented to person, place, and time. No cranial nerve deficit.  Skin: Skin is warm and dry.  Psychiatric: She has a normal mood and affect. Her behavior is normal.    Lab Results  Component Value Date   WBC 9.4 09/19/2014   HGB 15.0 09/19/2014   HCT 44.2 09/19/2014   PLT 249 09/19/2014   GLUCOSE 164 (H) 09/19/2014   CHOL 183 03/19/2010   TRIG 104 03/19/2010   HDL 53 03/19/2010   LDLDIRECT 110 (H) 09/30/2012   LDLCALC 109 (H) 03/19/2010   ALT 80 (H) 09/19/2014   AST 76 (H) 09/19/2014   NA 138 09/19/2014   K  3.6 09/19/2014   CL 101 09/19/2014   CREATININE 0.71 09/19/2014   BUN 14 09/19/2014   CO2 28 09/19/2014   TSH 1.959 03/19/2010   HGBA1C 6.1 05/10/2013   MICROALBUR 0.54 01/01/2010    Mm Digital Screening Bilateral  Result Date: 10/18/2015 CLINICAL DATA:  Screening. EXAM: DIGITAL SCREENING BILATERAL MAMMOGRAM WITH CAD COMPARISON:  Previous exam(s). ACR Breast Density Category b: There are scattered areas of fibroglandular density. FINDINGS: There are no findings suspicious for malignancy. Images were processed with CAD. IMPRESSION: No mammographic evidence of malignancy. A result letter of this screening mammogram will be mailed directly to the patient. RECOMMENDATION: Screening mammogram in one year. (Code:SM-B-01Y) BI-RADS CATEGORY  1: Negative. Electronically Signed   By: Ammie Ferrier M.D.   On: 10/18/2015 09:14    Assessment & Plan:   Haelie was seen today for establish care.  Diagnoses and all orders for this visit:  Neck pain, musculoskeletal -     Naproxen Sodium (ALEVE) 220 MG CAPS; Take 1 capsule (220 mg total) by mouth 2 (two) times daily between meals as needed.  Hepatitis C virus infection without hepatic coma, unspecified chronicity -     AMB referral to hepatitis C clinic   I have discontinued Ms. Oren's atenolol-chlorthalidone, HYDROcodone-acetaminophen, ibuprofen, vitamin B-12, milk thistle, lisinopril, and chlorthalidone. I am also having her start on Naproxen Sodium. Additionally, I am having her maintain her cholecalciferol, lisinopril-hydrochlorothiazide, glimepiride, and aspirin.  Meds ordered this encounter  Medications  . lisinopril-hydrochlorothiazide (PRINZIDE,ZESTORETIC) 20-25 MG tablet    Sig: Take 2 tablets by mouth daily.  Marland Kitchen glimepiride (AMARYL) 1 MG tablet    Sig: Take 1 mg by mouth daily with breakfast.  . aspirin 81 MG chewable tablet    Sig: Chew 81 mg by mouth daily.  . Naproxen Sodium (ALEVE) 220 MG CAPS    Sig: Take 1 capsule (220 mg  total) by mouth 2 (two) times daily between meals as needed.    Dispense:  30 each    Refill:  0    Order Specific Question:   Supervising Provider    Answer:   Cassandria Anger [[id number]]    Follow-up: Return in about 4 weeks (around 08/22/2016) for CPE, DM and HTN, hyperlipidemia (fasting).  Wilfred Lacy, NP

## 2016-07-22 NOTE — Progress Notes (Signed)
Pre visit review using our clinic review tool, if applicable. No additional management support is needed unless otherwise documented below in the visit note. 

## 2016-08-22 ENCOUNTER — Ambulatory Visit: Payer: No Typology Code available for payment source | Admitting: Nurse Practitioner

## 2016-08-27 ENCOUNTER — Other Ambulatory Visit: Payer: Self-pay | Admitting: *Deleted

## 2016-08-27 MED ORDER — LISINOPRIL-HYDROCHLOROTHIAZIDE 20-25 MG PO TABS: 2.0000 | ORAL_TABLET | Freq: Every day | ORAL | 3 refills | Status: DC

## 2016-08-28 ENCOUNTER — Encounter: Payer: No Typology Code available for payment source | Admitting: Internal Medicine

## 2016-09-02 ENCOUNTER — Ambulatory Visit (INDEPENDENT_AMBULATORY_CARE_PROVIDER_SITE_OTHER): Payer: BC Managed Care – PPO | Admitting: Nurse Practitioner

## 2016-09-02 ENCOUNTER — Other Ambulatory Visit (INDEPENDENT_AMBULATORY_CARE_PROVIDER_SITE_OTHER): Payer: No Typology Code available for payment source

## 2016-09-02 ENCOUNTER — Encounter: Payer: Self-pay | Admitting: Nurse Practitioner

## 2016-09-02 VITALS — BP 162/140 | HR 78 | Temp 98.5°F | Ht 63.0 in | Wt 250.0 lb

## 2016-09-02 DIAGNOSIS — Z136 Encounter for screening for cardiovascular disorders: Secondary | ICD-10-CM | POA: Diagnosis not present

## 2016-09-02 DIAGNOSIS — Z13220 Encounter for screening for lipoid disorders: Secondary | ICD-10-CM | POA: Diagnosis not present

## 2016-09-02 DIAGNOSIS — I10 Essential (primary) hypertension: Secondary | ICD-10-CM

## 2016-09-02 DIAGNOSIS — R7303 Prediabetes: Secondary | ICD-10-CM

## 2016-09-02 LAB — COMPREHENSIVE METABOLIC PANEL
ALBUMIN: 3.5 g/dL (ref 3.5–5.2)
ALK PHOS: 62 U/L (ref 39–117)
ALT: 66 U/L — AB (ref 0–35)
AST: 64 U/L — ABNORMAL HIGH (ref 0–37)
BUN: 13 mg/dL (ref 6–23)
CALCIUM: 9.4 mg/dL (ref 8.4–10.5)
CO2: 35 mEq/L — ABNORMAL HIGH (ref 19–32)
Chloride: 96 mEq/L (ref 96–112)
Creatinine, Ser: 0.76 mg/dL (ref 0.40–1.20)
GFR: 100.01 mL/min (ref >60.00)
Glucose, Bld: 143 mg/dL — ABNORMAL HIGH (ref 70–99)
Potassium: 4 mEq/L (ref 3.5–5.1)
Sodium: 137 mEq/L (ref 135–145)
Total Bilirubin: 0.5 mg/dL (ref 0.2–1.2)
Total Protein: 7.8 g/dL (ref 6.0–8.3)

## 2016-09-02 LAB — LIPID PANEL
CHOLESTEROL: 183 mg/dL (ref 0–200)
HDL: 49.2 mg/dL (ref >39.00)
LDL Cholesterol: 111 mg/dL — ABNORMAL HIGH (ref 0–99)
NonHDL: 133.69
TRIGLYCERIDES: 111 mg/dL (ref 0.0–149.0)
Total CHOL/HDL Ratio: 4
VLDL: 22.2 mg/dL (ref 0.0–40.0)

## 2016-09-02 LAB — HEMOGLOBIN A1C: Hgb A1c MFr Bld: 6.5 % (ref 4.6–6.5)

## 2016-09-02 MED ORDER — AMLODIPINE BESYLATE 5 MG PO TABS
5.0000 mg | ORAL_TABLET | Freq: Every day | ORAL | 3 refills | Status: DC
Start: 2016-09-02 — End: 2016-11-04

## 2016-09-02 NOTE — Patient Instructions (Signed)
Managing Your Hypertension Hypertension is commonly called high blood pressure. Blood pressure is a measurement of how strongly your blood is pressing against the walls of your arteries. Arteries are blood vessels that carry blood from your heart throughout your body. Blood pressure does not stay the same. It rises when you are active, excited, or nervous. It lowers when you are sleeping or relaxed. If the numbers that measure your blood pressure stay above normal most of the time, you are at risk for health problems. Hypertension is a long-term (chronic) condition in which blood pressure is elevated. This condition often has no signs or symptoms. The cause of the condition is usually not known. What are blood pressure readings? A blood pressure reading is recorded as two numbers, such as "120 over 80" (or 120/80). The first ("top") number is called the systolic pressure. It is a measure of the pressure in your arteries as the heart beats. The second ("bottom") number is called the diastolic pressure. It is a measure of the pressure in your arteries as the heart relaxes between beats. What does my blood pressure reading mean? Blood pressure is classified into four stages. Based on your blood pressure reading, your health care provider may use the following stages to determine what type of treatment, if any, is needed. Systolic pressure and diastolic pressure are measured in a unit called mm Hg. Normal  Systolic pressure: below 123456.  Diastolic pressure: below 80. Prehypertension  Systolic pressure: 123456.  Diastolic pressure: [id number]. Hypertension stage 1  Systolic pressure: A999333.  Diastolic pressure: A999333. Hypertension stage 2  Systolic pressure: 0000000 or above.  Diastolic pressure: 123XX123 or above. What health risks are associated with hypertension? Managing your hypertension is an important responsibility. Uncontrolled hypertension can lead to:  A heart attack.  A stroke.  A  weakened blood vessel (aneurysm).  Heart failure.  Kidney damage.  Eye damage.  Metabolic syndrome.  Memory and concentration problems. What changes can I make to manage my hypertension? Hypertension can be managed effectively by making lifestyle changes and possibly by taking medicines. Your health care provider will help you come up with a plan to bring your blood pressure within a normal range. Your plan should include the following: Monitoring  Monitor your blood pressure at home as told by your health care provider. Your personal target blood pressure may vary depending on your medical conditions, your age, and other factors.  Have your blood pressure rechecked as told by your health care provider. Lifestyle  Lose weight if necessary.  Get at least 30-45 minutes of aerobic exercise at least 4 times a week.  Do not use any products that contain nicotine or tobacco, such as cigarettes and e-cigarettes. If you need help quitting, ask your health care provider.  Learn ways to reduce stress.  Control any chronic conditions, such as high cholesterol or diabetes. Eating and drinking  Follow the DASH diet. This diet is high in fruits, vegetables, and whole grains. It is low in salt, red meat, and added sugars.  Keep your sodium intake below 2,300 mg per day.  Limit alcoholic beverages. Communication  Review all the medicines you take with your health care provider because there may be side effects or interactions.  Talk with your health care provider about your diet, exercise habits, and other lifestyle factors that may be contributing to hypertension.  See your health care provider regularly. Your health care provider can help you create and adjust your plan for managing hypertension. Will  I need medicine to control my blood pressure? Your health care provider may prescribe medicine if lifestyle changes are not enough to get your blood pressure under control, and if one of  the following is true:  You are 23-39 years of age, and your systolic blood pressure is 140 or higher.  You are 69 years of age or older, and your systolic blood pressure is 150 or higher.  Your diastolic blood pressure is 90 or higher.  You have diabetes, and your systolic blood pressure is over [id number] or your diastolic blood pressure is over 90.  You have kidney disease, and your blood pressure is above 140/90.  You have heart disease or a history of stroke, and your blood pressure is 140/90 or higher. Take medicines only as told by your health care provider. Follow the directions carefully. Blood pressure medicines must be taken as prescribed. The medicine does not work as well when you skip doses. Skipping doses also puts you at risk for problems. Contact a health care provider if:  You think you are having a reaction to medicines you have taken.  You have repeated (recurrent) headaches.  You feel dizzy.  You have swelling in your ankles.  You have trouble with your vision. Get help right away if:  You develop a severe headache or confusion.  You have unusual weakness or numbness, or you feel faint.  You have severe pain in your chest or abdomen.  You vomit repeatedly.  You have trouble breathing. This information is not intended to replace advice given to you by your health care provider. Make sure you discuss any questions you have with your health care provider. Document Released: 04/29/2012 Document Revised: 04/09/2016 Document Reviewed: 11/03/2015 Elsevier Interactive Patient Education  2017 [employer].

## 2016-09-02 NOTE — Progress Notes (Signed)
Pre visit review using our clinic review tool, if applicable. No additional management support is needed unless otherwise documented below in the visit note. 

## 2016-09-02 NOTE — Progress Notes (Signed)
Subjective:  Patient ID: Melanie Alvarez, female    DOB: January 18, 1956  Age: 61 y.o. MRN: [id number]  CC: Follow-up (follow on med, req lab work? )   Hypertension  This is a chronic problem. The current episode started more than 1 year ago. The problem is uncontrolled. Pertinent negatives include no chest pain, headaches, malaise/fatigue, orthopnea, palpitations, peripheral edema, PND or shortness of breath. There are no associated agents to hypertension. Risk factors for coronary artery disease include diabetes mellitus, obesity, post-menopausal state and smoking/tobacco exposure. Past treatments include ACE inhibitors and diuretics. Compliance problems include psychosocial issues.     Outpatient Medications Prior to Visit  Medication Sig Dispense Refill  . aspirin 81 MG chewable tablet Chew 81 mg by mouth daily.    . cholecalciferol (VITAMIN D) 400 UNITS TABS tablet Take 400 Units by mouth daily.    Marland Kitchen glimepiride (AMARYL) 1 MG tablet Take 1 mg by mouth daily with breakfast.    . lisinopril-hydrochlorothiazide (PRINZIDE,ZESTORETIC) 20-25 MG tablet Take 2 tablets by mouth daily. 60 tablet 3  . Naproxen Sodium (ALEVE) 220 MG CAPS Take 1 capsule (220 mg total) by mouth 2 (two) times daily between meals as needed. (Patient not taking: Reported on 09/02/2016) 30 each 0   No facility-administered medications prior to visit.     ROS See HPI  Objective:  BP (!) 162/140   Pulse 78   Temp 98.5 F (36.9 C)   Ht _0  (1.6 m)   Wt 250 lb (113.4 kg)   SpO2 97%   BMI 44.29 kg/m   BP Readings from Last 3 Encounters:  09/02/16 (!) 162/140  07/22/16 (!) 150/92  09/19/14 157/90    Wt Readings from Last 3 Encounters:  09/02/16 250 lb (113.4 kg)  07/22/16 250 lb (113.4 kg)  12/20/13 249 lb (112.9 kg)    Physical Exam  Constitutional: She is oriented to person, place, and time. No distress.  Neck: Normal range of motion. Neck supple.  Cardiovascular: Normal rate, regular rhythm and  normal heart sounds.   Pulmonary/Chest: Effort normal and breath sounds normal. No respiratory distress.  Abdominal: Soft. She exhibits no distension.  Musculoskeletal: Normal range of motion. She exhibits no edema.  Lymphadenopathy:    She has no cervical adenopathy.  Neurological: She is alert and oriented to person, place, and time.  Skin: Skin is warm and dry.  Psychiatric: She has a normal mood and affect. Her behavior is normal.    Lab Results  Component Value Date   WBC 9.4 09/19/2014   HGB 15.0 09/19/2014   HCT 44.2 09/19/2014   PLT 249 09/19/2014   GLUCOSE 164 (H) 09/19/2014   CHOL 183 03/19/2010   TRIG 104 03/19/2010   HDL 53 03/19/2010   LDLDIRECT 110 (H) 09/30/2012   LDLCALC 109 (H) 03/19/2010   ALT 80 (H) 09/19/2014   AST 76 (H) 09/19/2014   NA 138 09/19/2014   K 3.6 09/19/2014   CL 101 09/19/2014   CREATININE 0.71 09/19/2014   BUN 14 09/19/2014   CO2 28 09/19/2014   TSH 1.959 03/19/2010   HGBA1C 6.1 05/10/2013   MICROALBUR 0.54 01/01/2010    Mm Digital Screening Bilateral  Result Date: 10/18/2015 CLINICAL DATA:  Screening. EXAM: DIGITAL SCREENING BILATERAL MAMMOGRAM WITH CAD COMPARISON:  Previous exam(s). ACR Breast Density Category b: There are scattered areas of fibroglandular density. FINDINGS: There are no findings suspicious for malignancy. Images were processed with CAD. IMPRESSION: No mammographic evidence of malignancy. A  result letter of this screening mammogram will be mailed directly to the patient. RECOMMENDATION: Screening mammogram in one year. (Code:SM-B-01Y) BI-RADS CATEGORY  1: Negative. Electronically Signed   By: Ammie Ferrier M.D.   On: 10/18/2015 09:14    Assessment & Plan:   Melanie Alvarez was seen today for follow-up.  Diagnoses and all orders for this visit:  Prediabetes -     Hemoglobin A1c; Future -     Comp Met (CMET); Future  Essential hypertension -     amLODipine (NORVASC) 5 MG tablet; Take 1 tablet (5 mg total) by mouth at  bedtime. -     Comp Met (CMET); Future  Encounter for lipid screening for cardiovascular disease -     Lipid panel; Future   I have discontinued [name]'s Naproxen Sodium. I am also having her start on amLODipine. Additionally, I am having her maintain her cholecalciferol, glimepiride, aspirin, and lisinopril-hydrochlorothiazide.  Meds ordered this encounter  Medications  . amLODipine (NORVASC) 5 MG tablet    Sig: Take 1 tablet (5 mg total) by mouth at bedtime.    Dispense:  30 tablet    Refill:  3    Order Specific Question:   Supervising Provider    Answer:   Cassandria Anger [[id number]]    Follow-up: Return in about 1 month (around 10/03/2016) for DM and HTN.  Wilfred Lacy, NP

## 2016-09-25 ENCOUNTER — Other Ambulatory Visit: Payer: Self-pay | Admitting: Nurse Practitioner

## 2016-09-25 ENCOUNTER — Encounter: Payer: Self-pay | Admitting: Internal Medicine

## 2016-09-25 ENCOUNTER — Ambulatory Visit (INDEPENDENT_AMBULATORY_CARE_PROVIDER_SITE_OTHER): Payer: BC Managed Care – PPO | Admitting: Internal Medicine

## 2016-09-25 VITALS — BP 143/88 | HR 90 | Temp 97.8°F | Ht 63.0 in | Wt 244.0 lb

## 2016-09-25 DIAGNOSIS — B182 Chronic viral hepatitis C: Secondary | ICD-10-CM

## 2016-09-25 DIAGNOSIS — Z1231 Encounter for screening mammogram for malignant neoplasm of breast: Secondary | ICD-10-CM

## 2016-09-25 NOTE — Patient Instructions (Signed)
Date 09/25/16  Dear Ms Freeman Caldron, As discussed in the [facility], your hepatitis C therapy will include highly effective medication(s) for treatment and will vary based on the type of hepatitis C and insurance approval.  Potential medications include:          Harvoni (sofosbuvir 90mg /ledipasvir 400mg ) tablet oral daily          OR     Epclusa (sofosbuvir 400mg /velpatasvir 100mg ) tablet oral daily          OR      Mavyret (glecaprevir 100 mg/pibrentasvir 40 mg): Take 3 tablets oral daily          OR     Zepatier (elbasvir 50 mg/grazoprevir 100 mg) oral daily, +/- ribavirin              Medications are typically for 8 or 12 weeks total ---------------------------------------------------------------- Your HCV Treatment Start Date: will be after you return in June   ---------------------------------------------------------------- [place]:   [facility] [address] [place], [place] [address] Phone: [phone] Hours: Monday to Friday 7:30 am to 6:00 pm   Please always contact your pharmacy at least 3-4 business days before you run out of medications to ensure your next month's medication is ready or 1 week prior to running out if you receive it by mail.  Remember, each prescription is for 28 days. ---------------------------------------------------------------- GENERAL NOTES REGARDING YOUR HEPATITIS C MEDICATION:  Some medications have the following interactions:  - Acid reducing agents such as H2 blockers (ie. Pepcid (famotidine), Zantac (ranitidine), Tagamet (cimetidine), Axid (nizatidine) and proton pump inhibitors (ie. Prilosec (omeprazole), Protonix (pantoprazole), Nexium (esomeprazole), or Aciphex (rabeprazole)). Do not take until you have discussed with a health care provider.    -Antacids that contain magnesium and/or aluminum hydroxide (ie. Milk of Magensia, Rolaids, Gaviscon, Maalox, Mylanta, an dArthritis Pain Formula).  -Calcium carbonate  (calcium supplements or antacids such as Tums, Caltrate, Os-Cal).  -St. John's wort or any products that contain St. John's wort like some herbal supplements  Please inform the office prior to starting any of these medications.  - The common side effects associated with Harvoni include:      1. Fatigue      2. Headache      3. Nausea      4. Diarrhea      5. Insomnia  Please note that this only lists the most common side effects and is NOT a comprehensive list of the potential side effects of these medications. For more information, please review the drug information sheets that come with your medication package from the pharmacy.  ---------------------------------------------------------------- GENERAL HELPFUL HINTS ON HCV THERAPY: 1. Stay well-hydrated. 2. Notify the [facility] of any changes in your other over-the-counter/herbal or prescription medications. 3. If you miss a dose of your medication, take the missed dose as soon as you remember. Return to your regular time/dose schedule the next day.  4.  Do not stop taking your medications without first talking with your healthcare provider. 5.  You may take Tylenol (acetaminophen), as long as the dose is less than 2000 mg (OR no more than 4 tablets of the Tylenol Extra Strengths 500mg  tablet) in 24 hours. 6.  You will see our pharmacist-specialist within the first 2 weeks of starting your medication to monitor for any possible side effects. 7.  You will have labs once during treatment, soon after treatment completion and one final lab 6 months after treatment completion to verify the  virus is out of your system.  Scharlene Gloss, [place] [facility] [place], [place]  [id number] [phone]

## 2016-09-25 NOTE — Progress Notes (Signed)
[facility]   CC: consideration for treatment for chronic hepatitis C  HPI:  +Melanie Alvarez is a 61 y.o. female who presents for initial evaluation and management of chronic hepatitis C.  Patient tested positive in early 2000. Hepatitis C-associated risk factors present are: none. Patient denies history of blood transfusion, IV drug abuse. Patient has not had other studies performed. Results: none. Patient has not had prior treatment for Hepatitis C. Patient does not have a past history of liver disease. Patient does not have a family history of liver disease. Patient does not  have associated signs or symptoms related to liver disease.   Records reviewed from PCP and she has HTN, tobacco abuse.  Transaminitis also noted.        Patient does not have documented immunity to Hepatitis A. Patient does not have documented immunity to Hepatitis B.    Review of Systems:  Constitutional: negative for fatigue and malaise Gastrointestinal: negative for diarrhea Integument/breast: negative for rash All other systems reviewed and are negative       Past Medical History:  Diagnosis Date  . Diabetes mellitus 2012  . Hepatitis 2010   history of Hepatitis C  . Hypertension 2011  . Obesity     Prior to Admission medications   Medication Sig Start Date End Date Taking? Authorizing Provider  amLODipine (NORVASC) 5 MG tablet Take 1 tablet (5 mg total) by mouth at bedtime. 09/02/16  Yes Flossie Buffy, NP  aspirin 81 MG chewable tablet Chew 81 mg by mouth daily.   Yes Historical Provider, MD  cholecalciferol (VITAMIN D) 400 UNITS TABS tablet Take 400 Units by mouth daily.   Yes Historical Provider, MD  glimepiride (AMARYL) 1 MG tablet Take 1 mg by mouth daily with breakfast.   Yes Historical Provider, MD  lisinopril-hydrochlorothiazide (PRINZIDE,ZESTORETIC) 20-25 MG tablet Take 2 tablets by mouth daily. 08/27/16  Yes Flossie Buffy, NP    No Known  Allergies  Social History  Substance Use Topics  . Smoking status: Current Some Day Smoker    Packs/day: 0.50    Types: Cigarettes  . Smokeless tobacco: Never Used  . Alcohol use 18.0 oz/week    24 Cans of beer, 6 Shots of liquor per week     Comment: weekends    Family History  Problem Relation Age of Onset  . Diabetes Sister   . Cancer Maternal Grandmother     leukemia   . Diabetes Maternal Grandfather   . Diabetes Paternal Grandfather   . Diabetes Paternal Grandmother   . Hypertension Sister   . Hypertension Brother   . Anesthesia problems Neg Hx   no cirrhosis   Objective:  Constitutional: in no apparent distress and alert,  Vitals:   09/25/16 0835  BP: (!) 143/88  Pulse: 90  Temp: 97.8 F (36.6 C)   Eyes: anicteric Cardiovascular: Cor RRR and No murmurs Respiratory: CTA B; normal respiratory effort Gastrointestinal: Bowel sounds are normal, liver is not enlarged, spleen is not enlarged Musculoskeletal: no pedal edema noted Skin: negatives: no rash; no porphyria cutanea tarda Lymphatic: no cervical lymphadenopathy   Laboratory Genotype: No results found for: HCVGENOTYPE HCV viral load: No results found for: HCVQUANT Lab Results  Component Value Date   WBC 9.4 09/19/2014   HGB 15.0 09/19/2014   HCT 44.2 09/19/2014   MCV 90.9 09/19/2014   PLT 249 09/19/2014    Lab Results  Component Value Date   CREATININE 0.76  09/02/2016   BUN 13 09/02/2016   NA 137 09/02/2016   K 4.0 09/02/2016   CL 96 09/02/2016   CO2 35 (H) 09/02/2016    Lab Results  Component Value Date   ALT 66 (H) 09/02/2016   AST 64 (H) 09/02/2016   ALKPHOS 62 09/02/2016     Labs and history reviewed and show CHILD-PUGH unknown  5-6 points: Child class A 7-9 points: Child class B 10-15 points: Child class C  Lab Results  Component Value Date   BILITOT 0.5 09/02/2016   ALBUMIN 3.5 09/02/2016     Assessment: New Patient with Chronic Hepatitis C genotype unknown, untreated.   I discussed with the patient the lab findings that confirm chronic hepatitis C as well as the natural history and progression of disease including about 30% of people who develop cirrhosis of the liver if left untreated and once cirrhosis is established there is a 2-7% risk per year of liver cancer and liver failure.  I discussed the importance of treatment and benefits in reducing the risk, even if significant liver fibrosis exists.   Plan: 1) Patient counseled extensively on limiting acetaminophen to no more than 2 grams daily, avoidance of alcohol. 2) Transmission discussed with patient including sexual transmission, sharing razors and toothbrush.   3) Will need referral to gastroenterology if concern for cirrhosis 4) Will need referral for substance abuse counseling: No.; Further work up to include urine drug screen  No. 5) Will prescribe appropriate medication based on genotype and coverage  6) Hepatitis A and B titers 7) Pneumovax vaccine if not previously given 9) Further work up to include liver staging with elastography 10) will follow up after school end.  At this time, she is a little nervous about any side effects interrupting her work in [employer]  Though side effects tend to be mild, I discussed with her the option of waiting until school is out in June.  She will return then and will do all the appropriate labs, vaccines and elastography at that time and treat during the summer.

## 2016-10-03 ENCOUNTER — Ambulatory Visit: Payer: No Typology Code available for payment source | Admitting: Nurse Practitioner

## 2016-11-01 ENCOUNTER — Ambulatory Visit: Payer: BC Managed Care – PPO | Admitting: Nurse Practitioner

## 2016-11-04 ENCOUNTER — Encounter: Payer: Self-pay | Admitting: Nurse Practitioner

## 2016-11-04 ENCOUNTER — Ambulatory Visit (INDEPENDENT_AMBULATORY_CARE_PROVIDER_SITE_OTHER): Payer: BC Managed Care – PPO | Admitting: Nurse Practitioner

## 2016-11-04 ENCOUNTER — Other Ambulatory Visit (INDEPENDENT_AMBULATORY_CARE_PROVIDER_SITE_OTHER): Payer: BC Managed Care – PPO

## 2016-11-04 VITALS — BP 128/82 | HR 68 | Temp 97.8°F | Ht 63.0 in | Wt 263.0 lb

## 2016-11-04 DIAGNOSIS — I10 Essential (primary) hypertension: Secondary | ICD-10-CM

## 2016-11-04 DIAGNOSIS — M79675 Pain in left toe(s): Secondary | ICD-10-CM

## 2016-11-04 DIAGNOSIS — M109 Gout, unspecified: Secondary | ICD-10-CM

## 2016-11-04 DIAGNOSIS — R7303 Prediabetes: Secondary | ICD-10-CM

## 2016-11-04 LAB — URIC ACID: Uric Acid, Serum: 8.5 mg/dL — ABNORMAL HIGH (ref 2.4–7.0)

## 2016-11-04 MED ORDER — LISINOPRIL-HYDROCHLOROTHIAZIDE 20-12.5 MG PO TABS: 2.0000 | ORAL_TABLET | Freq: Every day | ORAL | 1 refills | Status: DC

## 2016-11-04 MED ORDER — AMLODIPINE BESYLATE 5 MG PO TABS: 5.0000 mg | ORAL_TABLET | Freq: Every day | ORAL | 1 refills | Status: DC

## 2016-11-04 MED ORDER — GLIMEPIRIDE 1 MG PO TABS: 1.0000 mg | ORAL_TABLET | Freq: Every day | ORAL | 1 refills | Status: DC

## 2016-11-04 NOTE — Patient Instructions (Signed)
Go to lab for blood draw. Call office with name of previous gout medication.  Encourage adequate oral hydration.  Low-Purine Diet Purines are compounds that affect the level of uric acid in your body. A low-purine diet is a diet that is low in purines. Eating a low-purine diet can prevent the level of uric acid in your body from getting too high and causing gout or kidney stones or both. What do I need to know about this diet?  Choose low-purine foods. Examples of low-purine foods are listed in the next section.  Drink plenty of fluids, especially water. Fluids can help remove uric acid from your body. Try to drink 8-16 cups (1.9-3.8 L) a day.  Limit foods high in fat, especially saturated fat, as fat makes it harder for the body to get rid of uric acid. Foods high in saturated fat include pizza, cheese, ice cream, whole milk, fried foods, and gravies. Choose foods that are lower in fat and lean sources of protein. Use olive oil when cooking as it contains healthy fats that are not high in saturated fat.  Limit alcohol. Alcohol interferes with the elimination of uric acid from your body. If you are having a gout attack, avoid all alcohol.  Keep in mind that different people's bodies react differently to different foods. You will probably learn over time which foods do or do not affect you. If you discover that a food tends to cause your gout to flare up, avoid eating that food. You can more freely enjoy foods that do not cause problems. If you have any questions about a food item, talk to your dietitian or health care provider. Which foods are low, moderate, and high in purines? The following is a list of foods that are low, moderate, and high in purines. You can eat any amount of the foods that are low in purines. You may be able to have small amounts of foods that are moderate in purines. Ask your health care provider how much of a food moderate in purines you can have. Avoid foods high in  purines. Grains   Foods low in purines: Enriched white bread, pasta, rice, cake, cornbread, popcorn.  Foods moderate in purines: Whole-grain breads and cereals, wheat germ, bran, oatmeal. Uncooked oatmeal. Dry wheat bran or wheat germ.  Foods high in purines: Pancakes, [place] toast, biscuits, muffins. Vegetables   Foods low in purines: All vegetables, except those that are moderate in purines.  Foods moderate in purines: Asparagus, cauliflower, spinach, mushrooms, green peas. Fruits   All fruits are low in purines. Meats and other Protein Foods   Foods low in purines: Eggs, nuts, peanut butter.  Foods moderate in purines: 80-90% lean beef, lamb, veal, pork, poultry, fish, eggs, peanut butter, nuts. Crab, lobster, oysters, and shrimp. Cooked dried beans, peas, and lentils.  Foods high in purines: Anchovies, sardines, herring, mussels, tuna, codfish, scallops, trout, and haddock. Berniece Salines. Organ meats (such as liver or kidney). Tripe. Game meat. Goose. Sweetbreads. Dairy   All dairy foods are low in purines. Low-fat and fat-free dairy products are best because they are low in saturated fat. Beverages   Drinks low in purines: Water, carbonated beverages, tea, coffee, cocoa.  Drinks moderate in purines: Soft drinks and other drinks sweetened with high-fructose corn syrup. Juices. To find whether a food or drink is sweetened with high-fructose corn syrup, look at the ingredients list.  Drinks high in purines: Alcoholic beverages (such as beer). Condiments   Foods low in purines:  Salt, herbs, olives, pickles, relishes, vinegar.  Foods moderate in purines: Butter, margarine, oils, mayonnaise. Fats and Oils   Foods low in purines: All types, except gravies and sauces made with meat.  Foods high in purines: Gravies and sauces made with meat. Other Foods   Foods low in purines: Sugars, sweets, gelatin. Cake. Soups made without meat.  Foods moderate in purines: Meat-based or  fish-based soups, broths, or bouillons. Foods and drinks sweetened with high-fructose corn syrup.  Foods high in purines: High-fat desserts (such as ice cream, cookies, cakes, pies, doughnuts, and chocolate). Contact your dietitian for more information on foods that are not listed here.  This information is not intended to replace advice given to you by your health care provider. Make sure you discuss any questions you have with your health care provider. Document Released: 11/30/2010 Document Revised: 01/11/2016 Document Reviewed: 07/12/2013 Elsevier Interactive Patient Education  2017 [employer].

## 2016-11-04 NOTE — Progress Notes (Signed)
Subjective:  Patient ID: Melanie Alvarez, female    DOB: 02-03-1956  Age: 61 y.o. MRN: [id number]  CC: Hypertension and Foot Swelling (Pt stated Lt great toe joint is swollen/painful for 3 days)   Hypertension  This is a chronic problem. The problem has been rapidly improving since onset. The problem is controlled. Associated symptoms include peripheral edema. Pertinent negatives include no anxiety, blurred vision, chest pain, headaches, malaise/fatigue, neck pain, orthopnea, palpitations, PND, shortness of breath or sweats. Risk factors for coronary artery disease include sedentary lifestyle, post-menopausal state, obesity and diabetes mellitus. Past treatments include ACE inhibitors, diuretics and calcium channel blockers.  Toe Pain   The incident occurred 3 to 5 days ago. There was no injury mechanism. The pain is present in the left foot. The quality of the pain is described as aching. The pain has been constant since onset. Pertinent negatives include no inability to bear weight, loss of motion, loss of sensation, muscle weakness, numbness or tingling. She reports no foreign bodies present. The symptoms are aggravated by movement and palpation. She has tried nothing for the symptoms.  has hx of gouty arthritis, treated with medication she does not remember name at this time.  Outpatient Medications Prior to Visit  Medication Sig Dispense Refill  . aspirin 81 MG chewable tablet Chew 81 mg by mouth daily.    . cholecalciferol (VITAMIN D) 400 UNITS TABS tablet Take 400 Units by mouth daily.    Marland Kitchen amLODipine (NORVASC) 5 MG tablet Take 1 tablet (5 mg total) by mouth at bedtime. 30 tablet 3  . glimepiride (AMARYL) 1 MG tablet Take 1 mg by mouth daily with breakfast.    . lisinopril-hydrochlorothiazide (PRINZIDE,ZESTORETIC) 20-25 MG tablet Take 2 tablets by mouth daily. 60 tablet 3   No facility-administered medications prior to visit.     ROS See HPI  Objective:  BP 128/82   Pulse 68    Temp 97.8 F (36.6 C)   Ht 5\' 3"  (1.6 m)   Wt 263 lb (119.3 kg)   SpO2 98%   BMI 46.59 kg/m   BP Readings from Last 3 Encounters:  11/04/16 128/82  09/25/16 (!) 143/88  09/02/16 (!) 162/140    Wt Readings from Last 3 Encounters:  11/04/16 263 lb (119.3 kg)  09/25/16 244 lb (110.7 kg)  09/02/16 250 lb (113.4 kg)    Physical Exam  Constitutional: She is oriented to person, place, and time. No distress.  Cardiovascular: Normal rate, regular rhythm and normal heart sounds.   Pulmonary/Chest: Effort normal and breath sounds normal.  Musculoskeletal: She exhibits edema and tenderness.       Left ankle: She exhibits swelling. She exhibits normal range of motion and normal pulse. No tenderness. Achilles tendon normal.       Left foot: There is decreased range of motion, tenderness, bony tenderness and swelling.       Feet:  Neurological: She is alert and oriented to person, place, and time.  Skin: Skin is warm and dry. There is erythema.  Vitals reviewed.   Lab Results  Component Value Date   WBC 9.4 09/19/2014   HGB 15.0 09/19/2014   HCT 44.2 09/19/2014   PLT 249 09/19/2014   GLUCOSE 143 (H) 09/02/2016   CHOL 183 09/02/2016   TRIG 111.0 09/02/2016   HDL 49.20 09/02/2016   LDLDIRECT 110 (H) 09/30/2012   LDLCALC 111 (H) 09/02/2016   ALT 66 (H) 09/02/2016   AST 64 (H) 09/02/2016   NA  137 09/02/2016   K 4.0 09/02/2016   CL 96 09/02/2016   CREATININE 0.76 09/02/2016   BUN 13 09/02/2016   CO2 35 (H) 09/02/2016   TSH 1.959 03/19/2010   HGBA1C 6.5 09/02/2016   MICROALBUR 0.54 01/01/2010    Mm Digital Screening Bilateral  Result Date: 10/16/2015 CLINICAL DATA:  Screening. EXAM: DIGITAL SCREENING BILATERAL MAMMOGRAM WITH CAD COMPARISON:  Previous exam(s). ACR Breast Density Category b: There are scattered areas of fibroglandular density. FINDINGS: There are no findings suspicious for malignancy. Images were processed with CAD. IMPRESSION: No mammographic evidence of  malignancy. A result letter of this screening mammogram will be mailed directly to the patient. RECOMMENDATION: Screening mammogram in one year. (Code:SM-B-01Y) BI-RADS CATEGORY  1: Negative. Electronically Signed   By: Ammie Ferrier M.D.   On: 10/18/2015 09:14    Assessment & Plan:   Anginette was seen today for hypertension and foot swelling.  Diagnoses and all orders for this visit:  Gouty arthritis of toe of left foot -     Uric acid; Future -     indomethacin (INDOCIN) 50 MG capsule; Take 1 capsule (50 mg total) by mouth 3 (three) times daily as needed. -     allopurinol (ZYLOPRIM) 100 MG tablet; Take 1 tablet (100 mg total) by mouth 2 (two) times daily.  Essential hypertension -     amLODipine (NORVASC) 5 MG tablet; Take 1 tablet (5 mg total) by mouth at bedtime. -     lisinopril-hydrochlorothiazide (ZESTORETIC) 20-12.5 MG tablet; Take 2 tablets by mouth daily.  Prediabetes -     glimepiride (AMARYL) 1 MG tablet; Take 1 tablet (1 mg total) by mouth daily with breakfast.   I have discontinued Ms. Thurmond's lisinopril-hydrochlorothiazide. I have also changed her glimepiride and allopurinol. Additionally, I am having her start on lisinopril-hydrochlorothiazide and indomethacin. Lastly, I am having her maintain her cholecalciferol, aspirin, and amLODipine.  Meds ordered this encounter  Medications  . glimepiride (AMARYL) 1 MG tablet    Sig: Take 1 tablet (1 mg total) by mouth daily with breakfast.    Dispense:  90 tablet    Refill:  1    Order Specific Question:   Supervising Provider    Answer:   Cassandria Anger [[id number]]  . amLODipine (NORVASC) 5 MG tablet    Sig: Take 1 tablet (5 mg total) by mouth at bedtime.    Dispense:  90 tablet    Refill:  1    Order Specific Question:   Supervising Provider    Answer:   Cassandria Anger [[id number]]  . lisinopril-hydrochlorothiazide (ZESTORETIC) 20-12.5 MG tablet    Sig: Take 2 tablets by mouth daily.    Dispense:  180 tablet     Refill:  1    Order Specific Question:   Supervising Provider    Answer:   Cassandria Anger [[id number]]  . indomethacin (INDOCIN) 50 MG capsule    Sig: Take 1 capsule (50 mg total) by mouth 3 (three) times daily as needed.    Dispense:  30 capsule    Refill:  0    Order Specific Question:   Supervising Provider    Answer:   Cassandria Anger [[id number]]  . allopurinol (ZYLOPRIM) 100 MG tablet    Sig: Take 1 tablet (100 mg total) by mouth 2 (two) times daily.    Dispense:  60 tablet    Refill:  3    Order Specific Question:   Supervising Provider  Answer:   Cassandria Anger [[id number]]    Follow-up: Return in about 3 months (around 02/04/2017) for HTN, gout, DM (fasting, will need labs).  Wilfred Lacy, NP

## 2016-11-05 ENCOUNTER — Telehealth: Payer: Self-pay | Admitting: Nurse Practitioner

## 2016-11-05 MED ORDER — ALLOPURINOL 100 MG PO TABS: 100.0000 mg | ORAL_TABLET | Freq: Every day | ORAL | 0 refills | Status: DC

## 2016-11-05 NOTE — Telephone Encounter (Signed)
Recorded on pt's chart of what med she is taking for gout. FYI

## 2016-11-06 MED ORDER — INDOMETHACIN 50 MG PO CAPS: 50.0000 mg | ORAL_CAPSULE | Freq: Three times a day (TID) | ORAL | 0 refills | Status: DC | PRN

## 2016-11-06 MED ORDER — ALLOPURINOL 100 MG PO TABS: 100.0000 mg | ORAL_TABLET | Freq: Two times a day (BID) | ORAL | 3 refills | Status: DC

## 2016-11-07 ENCOUNTER — Telehealth: Payer: Self-pay | Admitting: Nurse Practitioner

## 2016-11-12 ENCOUNTER — Telehealth: Payer: Self-pay | Admitting: Nurse Practitioner

## 2016-11-12 NOTE — Telephone Encounter (Signed)
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 Pt was hopping to get a letter from you saying she can work in a cold environment due to arthritis pain on her shoulders. Please advise

## 2016-11-13 ENCOUNTER — Telehealth: Payer: Self-pay | Admitting: Nurse Practitioner

## 2016-11-13 NOTE — Telephone Encounter (Signed)
Left vm for pt to callback 

## 2016-11-13 NOTE — Telephone Encounter (Signed)
Patient states lisinopril is not working for her.  She wants to go back to her original dosage.  States she has headaches now.  Where as before she did not.

## 2016-11-13 NOTE — Telephone Encounter (Signed)
Spoke with pt, she said charlotte change the dosage on lisinopril and ever since then her systaltic is high around 160 and she has headache. Pt stated she wants to go back to her original med. Please advise, pt aware charlotte is not in office today.

## 2016-11-14 NOTE — Telephone Encounter (Signed)
Left vm for pt to call back, need to inform Charlotte's massage below.  

## 2016-11-14 NOTE — Telephone Encounter (Signed)
Left pt vm to return call, need to schedule an appt with [facility] for BP eval and to inform about the letter.   Please help make an appt for pt.

## 2016-11-14 NOTE — Telephone Encounter (Signed)
unfortunately I will not be able to write such a letter, because that is not a contraindication to osteoarthritis. Thank you.

## 2016-11-14 NOTE — Telephone Encounter (Signed)
t called back and I informed her she would need an appointment for her BP and that unfortunately you will not be able to write the note. 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 I told her you cannot write it and she hung on me.

## 2016-11-14 NOTE — Telephone Encounter (Signed)
Aldona Bar spoke with pt, advise her of Charlotte's response and offer an appt for BP eval. Aldona Bar said pt is not too happy and hung up on her with making an appt.

## 2016-11-14 NOTE — Telephone Encounter (Signed)
Please have patient return to office on Monday for BP evaluation. She will need an appt.

## 2016-11-19 ENCOUNTER — Ambulatory Visit: Payer: BC Managed Care – PPO

## 2016-12-14 IMAGING — US US LIVER ELASTOGRAPHY
1 series · 13 of 25 positions shown · non-contrast
Comparison: None.

CLINICAL DATA: Hepatitis-C

EXAM:
US LIVER ELASTOGRAPHY
TECHNIQUE: Ultrasound elastography evaluation of the liver was performed. A
region of interest was placed in the right lobe of the liver.
Following application of a compressive sonographic pulse, shear
waves were detected in the adjacent hepatic tissue and the shear
wave velocity was calculated. Multiple assessments were performed at
the selected site. Median shear wave velocity is correlated to a
Metavir fibrosis score.

[Series 1: us liver elastography · 0.26mm/px · 13 of 40 slices shown]
[im 1/40]
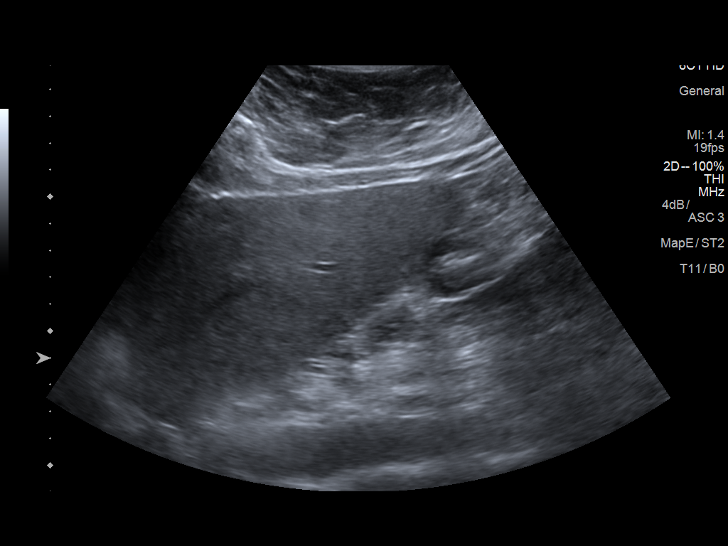
[im 4/40]
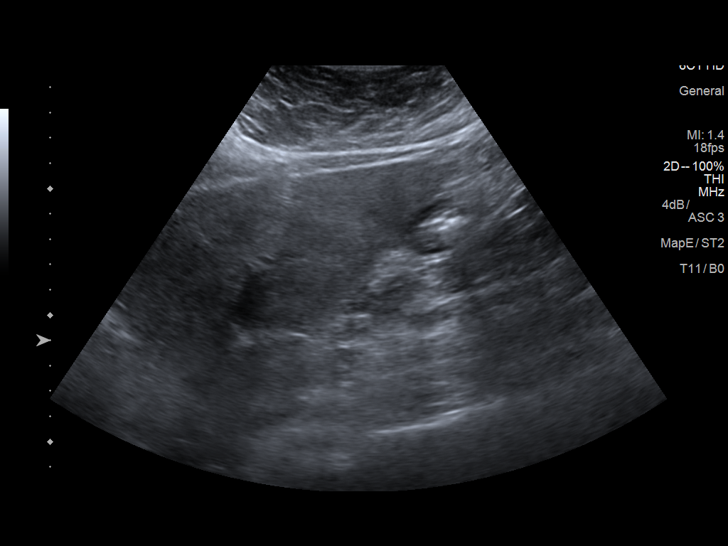
[im 7/40]
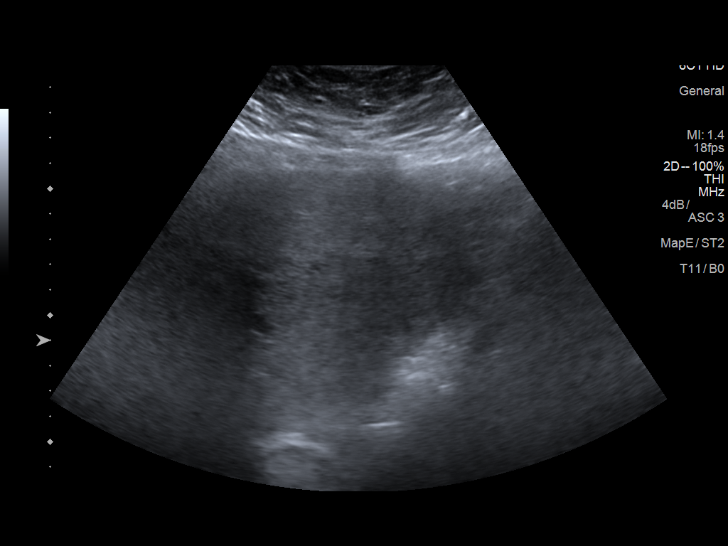
[im 10/40]
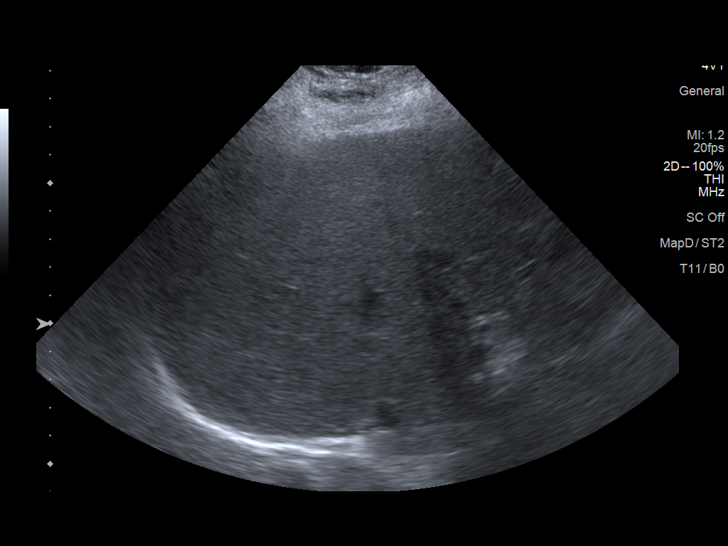
[im 14/40]
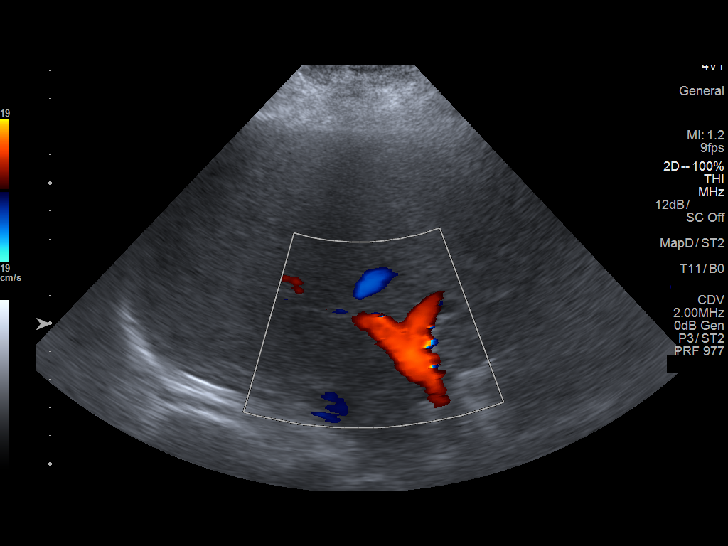
[im 17/40]
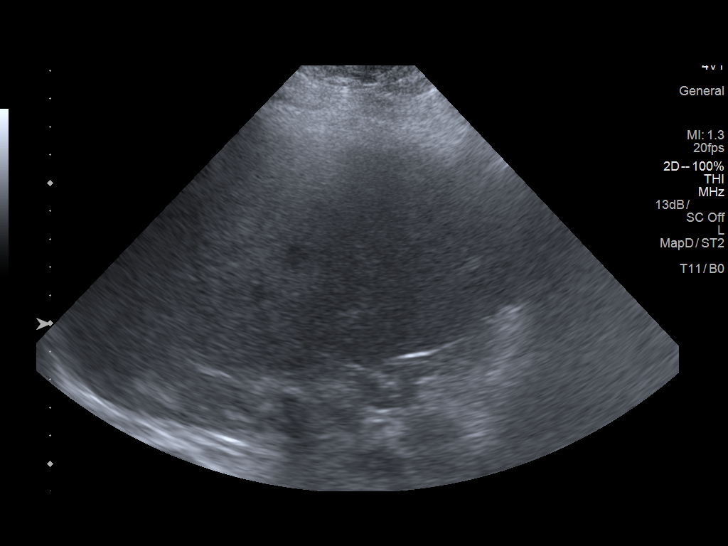
[im 20/40]
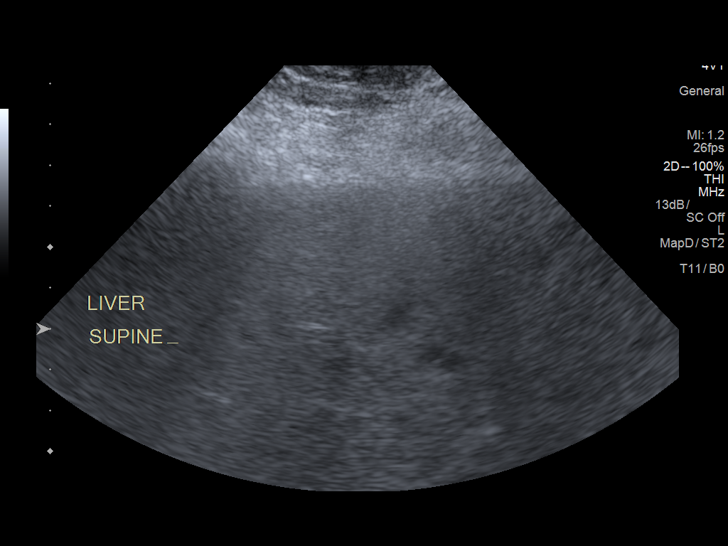
[im 23/40]
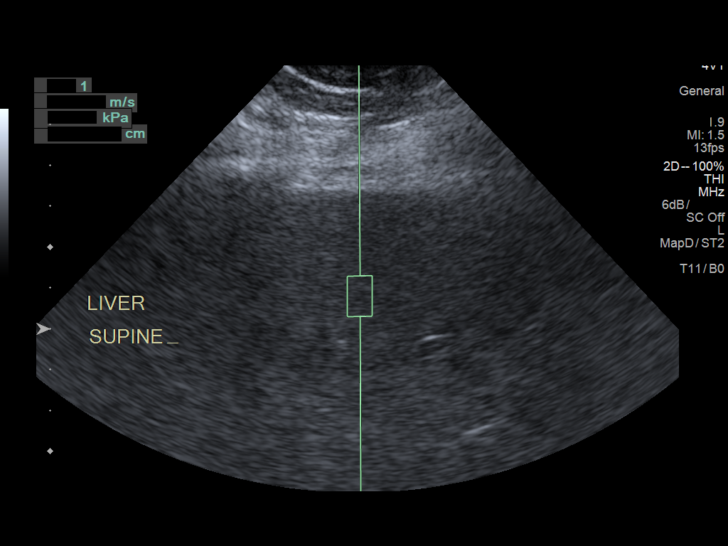
[im 27/40]
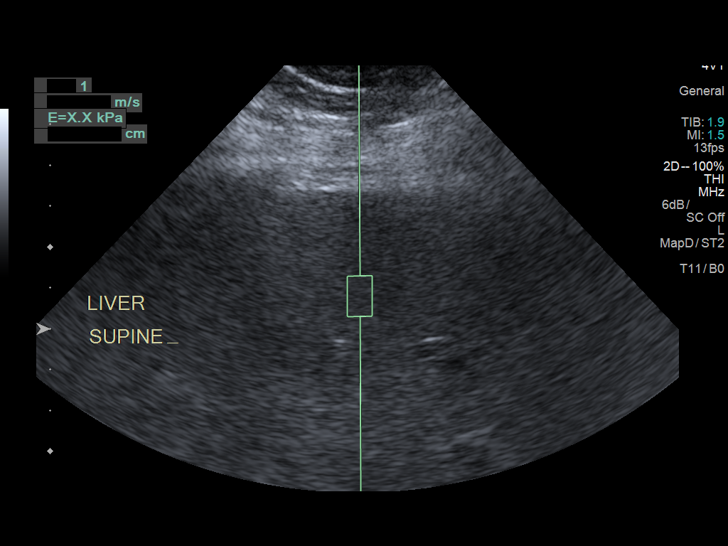
[im 30/40]
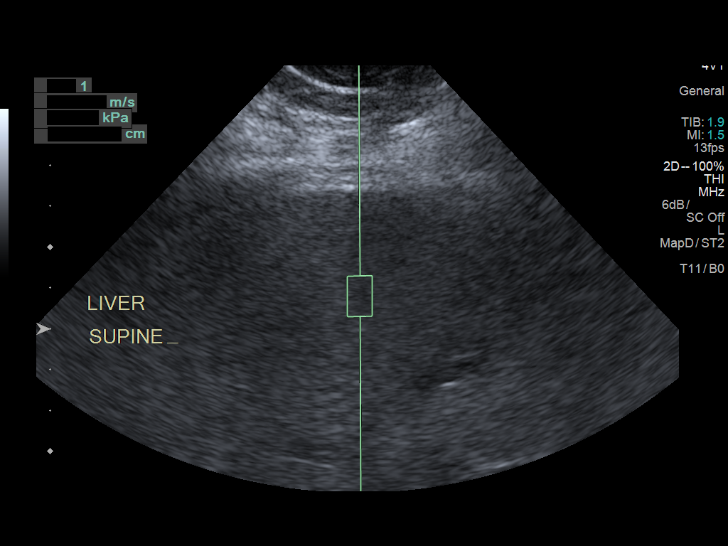
[im 33/40]
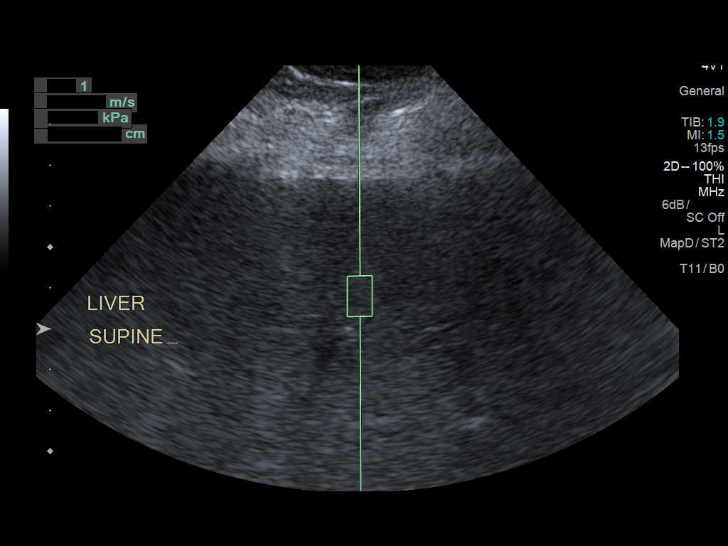
[im 36/40]
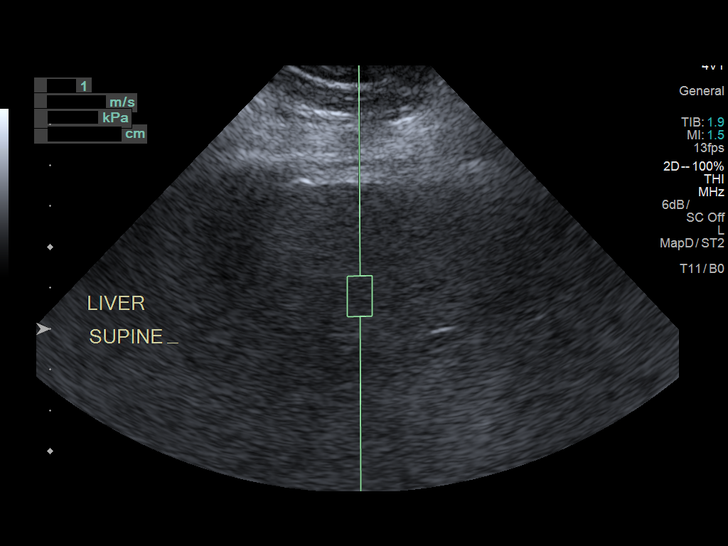
[im 40/40]
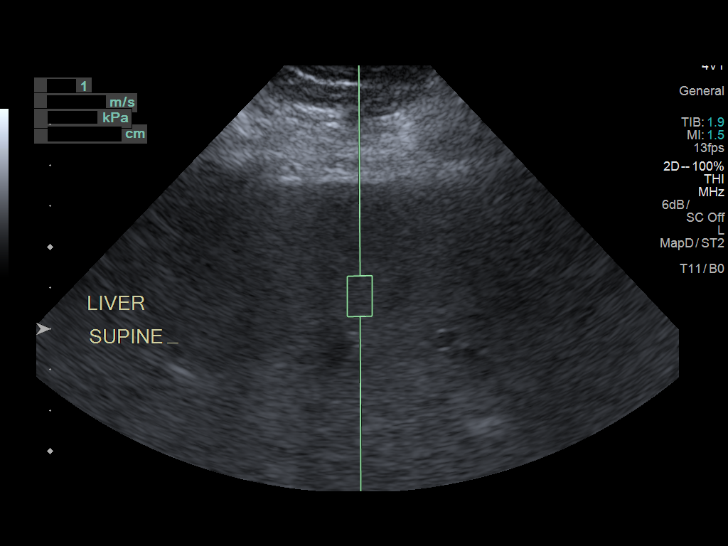

[13 of 25 positions shown; findings below may reference images not displayed]

FINDINGS: Liver: Increased echotexture compatible with fatty infiltration or
intrinsic liver disease. No focal abnormality or biliary ductal
dilatation. Portal vein is patent on color Doppler imaging with
normal direction of blood flow towards the liver.

ULTRASOUND HEPATIC ELASTOGRAPHY

Device: Siemens Helix VTQ

Patient position: Supine

Transducer 4V1

Number of measurements: 10

Hepatic segment:  Eight

Median velocity:   3.26 m/sec

IQR:

IQR/Median velocity ratio:

Corresponding Metavir fibrosis score:  Some F3 + F4

Risk of fibrosis: High

Limitations of exam: None

Please note that abnormal shear wave velocities may also be
identified in clinical settings other than with hepatic fibrosis,
such as: acute hepatitis, elevated right heart and central venous
pressures including use of beta blockers, SAPATKA disease
(SAPATKA), infiltrative processes such as
mastocytosis/amyloidosis/infiltrative tumor, extrahepatic
cholestasis, in the post-prandial state, and liver transplantation.
Correlation with patient history, laboratory data, and clinical
condition recommended.
IMPRESSION: Liver: Increased echotexture compatible with fatty infiltration or
intrinsic liver disease.

Elastography: Median hepatic shear wave velocity is calculated at
3.26 m/sec.

Corresponding Metavir fibrosis score is Some F3 + F4

Risk of fibrosis is High.

Follow-up: Follow up advised

## 2017-02-03 ENCOUNTER — Ambulatory Visit: Payer: BC Managed Care – PPO

## 2017-02-04 ENCOUNTER — Ambulatory Visit: Payer: BC Managed Care – PPO | Admitting: Nurse Practitioner

## 2017-02-06 ENCOUNTER — Ambulatory Visit: Payer: BC Managed Care – PPO | Admitting: Internal Medicine

## 2017-02-17 ENCOUNTER — Ambulatory Visit
Admission: RE | Admit: 2017-02-17 | Discharge: 2017-02-17 | Disposition: A | Discharge status: Home / Self Care | Payer: BC Managed Care – PPO | Source: Ambulatory Visit | Attending: Nurse Practitioner | Admitting: Nurse Practitioner

## 2017-02-17 DIAGNOSIS — Z1231 Encounter for screening mammogram for malignant neoplasm of breast: Secondary | ICD-10-CM

## 2017-02-17 IMAGING — MG DIGITAL SCREENING BILATERAL MAMMOGRAM WITH CAD
5 series · 5 of 5 positions shown · non-contrast
Comparison: Previous exam(s).

CLINICAL DATA: Screening.

EXAM:
DIGITAL SCREENING BILATERAL MAMMOGRAM WITH CAD

[R CC]
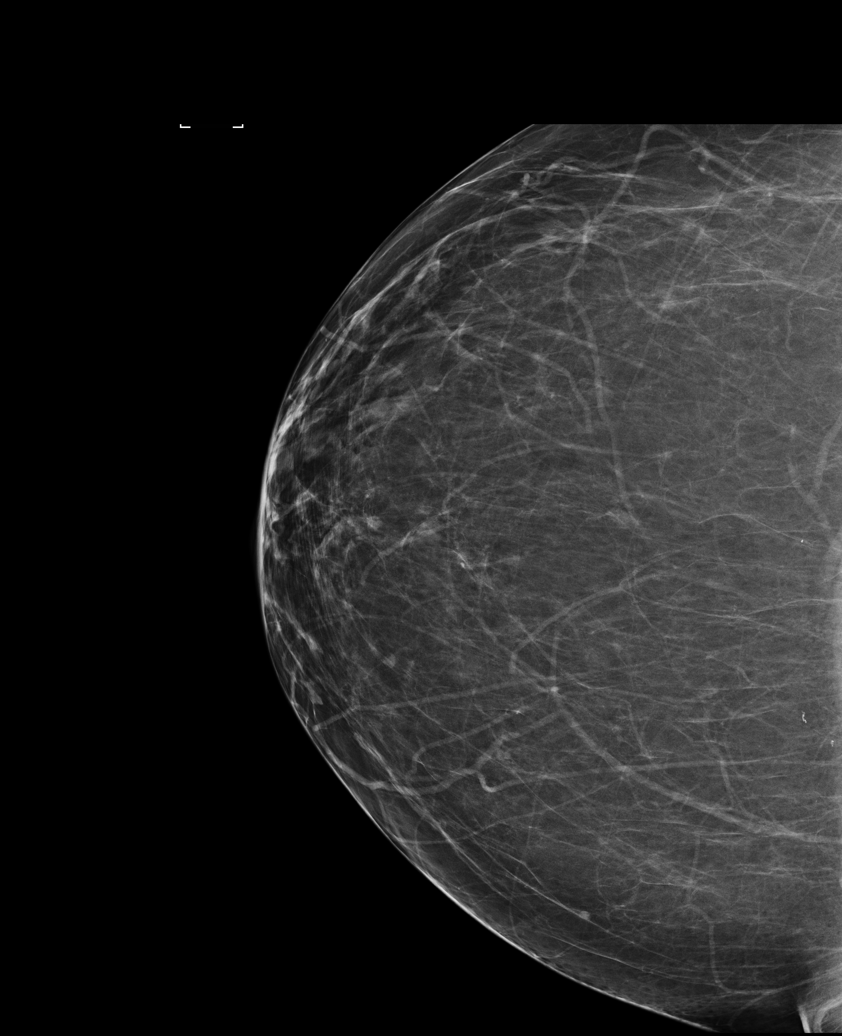

[L MLO]
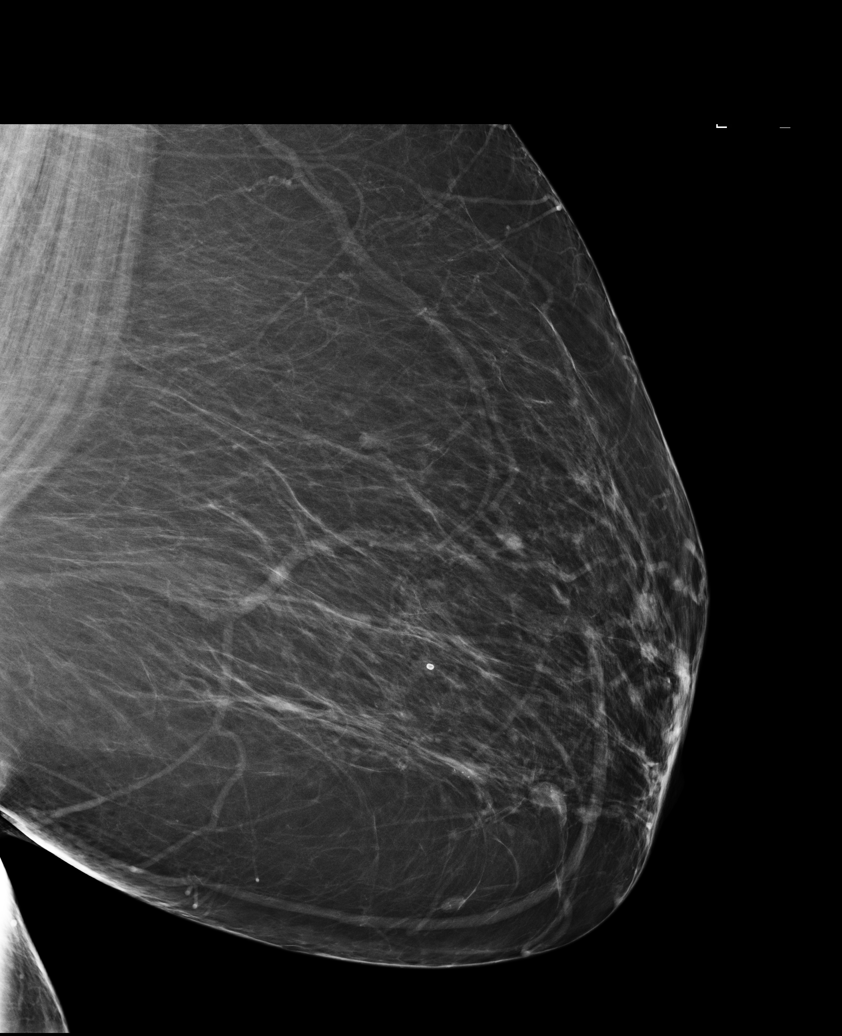

[L CC]
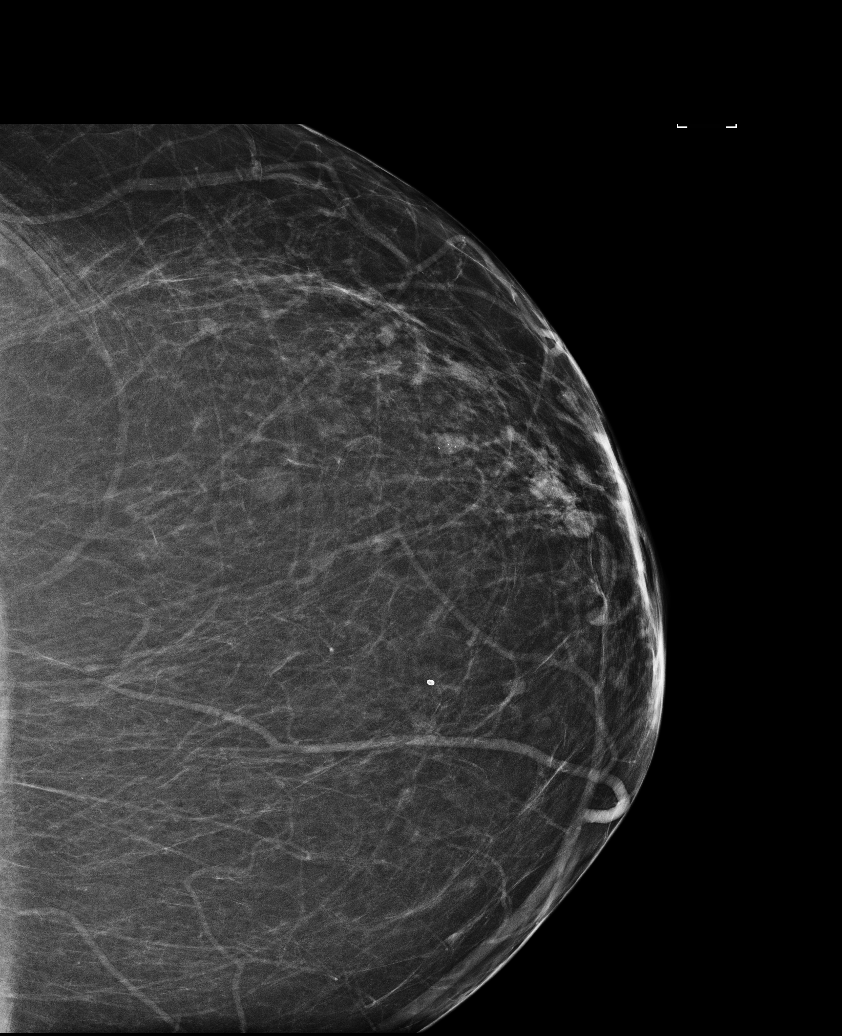

[L CV]
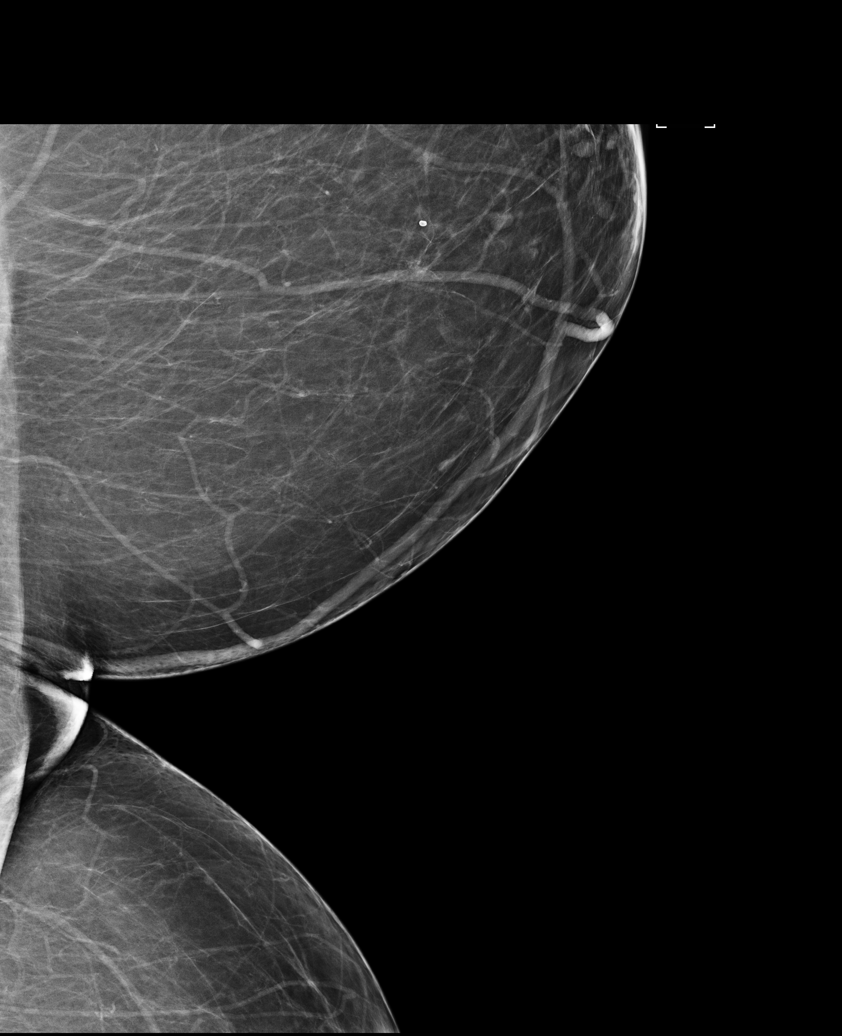

[R MLO]
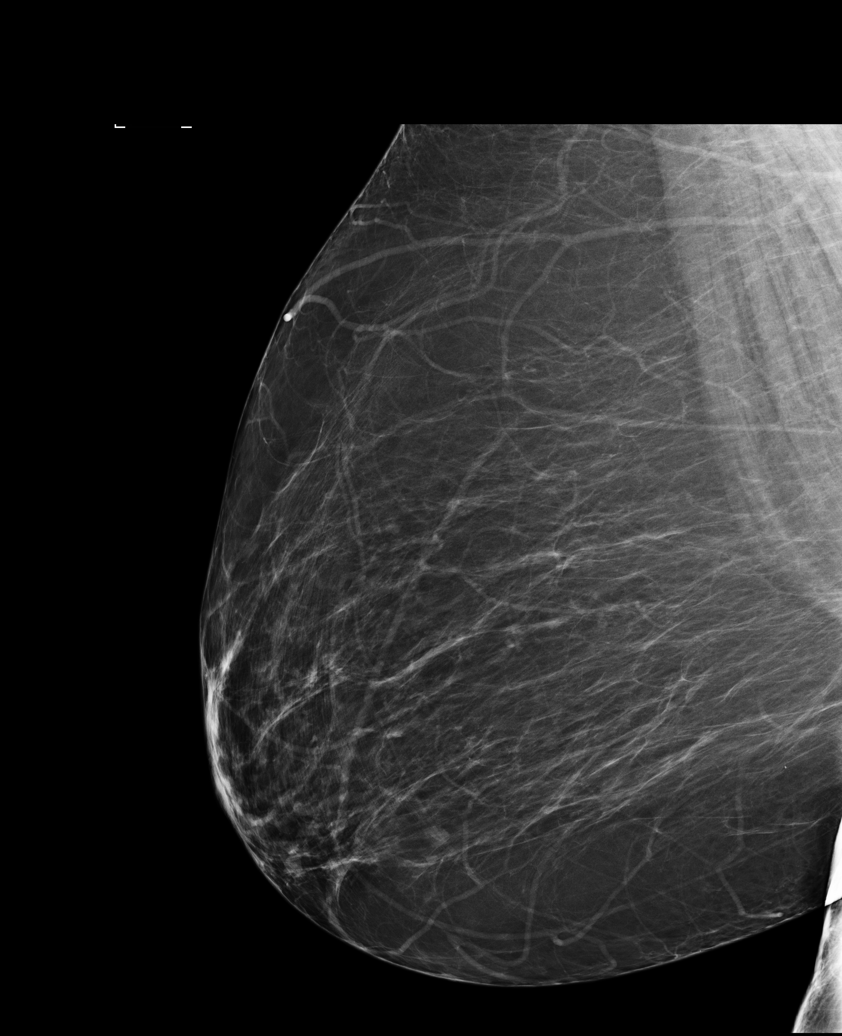

[5 of 5 positions shown; findings below may reference images not displayed]

ACR Breast Density Category b: There are scattered areas of
fibroglandular density.
FINDINGS: In the left breast, calcifications warrant further evaluation. In
the right breast, no findings suspicious for malignancy. Images were
processed with CAD.
IMPRESSION: Further evaluation is suggested for calcifications in the left
breast.

RECOMMENDATION:
Diagnostic mammogram of the left breast. (Code:[XQ])

The patient will be contacted regarding the findings, and additional
imaging will be scheduled.

BI-RADS CATEGORY  0: Incomplete. Need additional imaging evaluation
and/or prior mammograms for comparison.

## 2017-02-18 ENCOUNTER — Other Ambulatory Visit: Payer: Self-pay | Admitting: Nurse Practitioner

## 2017-02-18 DIAGNOSIS — R928 Other abnormal and inconclusive findings on diagnostic imaging of breast: Secondary | ICD-10-CM

## 2017-02-27 ENCOUNTER — Other Ambulatory Visit: Payer: Self-pay | Admitting: Nurse Practitioner

## 2017-02-27 ENCOUNTER — Ambulatory Visit
Admission: RE | Admit: 2017-02-27 | Discharge: 2017-02-27 | Disposition: A | Discharge status: Home / Self Care | Payer: BC Managed Care – PPO | Source: Ambulatory Visit | Attending: Nurse Practitioner | Admitting: Nurse Practitioner

## 2017-02-27 DIAGNOSIS — R928 Other abnormal and inconclusive findings on diagnostic imaging of breast: Secondary | ICD-10-CM

## 2017-04-25 ENCOUNTER — Encounter: Payer: Self-pay | Admitting: Nurse Practitioner

## 2017-04-25 ENCOUNTER — Ambulatory Visit (INDEPENDENT_AMBULATORY_CARE_PROVIDER_SITE_OTHER): Payer: BC Managed Care – PPO | Admitting: Nurse Practitioner

## 2017-04-25 VITALS — BP 146/104 | HR 88 | Temp 98.3°F | Ht 63.0 in | Wt 251.0 lb

## 2017-04-25 DIAGNOSIS — R7303 Prediabetes: Secondary | ICD-10-CM | POA: Diagnosis not present

## 2017-04-25 DIAGNOSIS — I10 Essential (primary) hypertension: Secondary | ICD-10-CM

## 2017-04-25 DIAGNOSIS — J069 Acute upper respiratory infection, unspecified: Secondary | ICD-10-CM | POA: Diagnosis not present

## 2017-04-25 MED ORDER — AMLODIPINE BESYLATE 5 MG PO TABS: 5.0000 mg | ORAL_TABLET | Freq: Every day | ORAL | 0 refills | Status: DC

## 2017-04-25 MED ORDER — GLIMEPIRIDE 1 MG PO TABS: 1.0000 mg | ORAL_TABLET | Freq: Every day | ORAL | 0 refills | Status: DC

## 2017-04-25 MED ORDER — LISINOPRIL-HYDROCHLOROTHIAZIDE 20-12.5 MG PO TABS: 2.0000 | ORAL_TABLET | Freq: Every day | ORAL | 0 refills | Status: DC

## 2017-04-25 MED ORDER — PROMETHAZINE-DM 6.25-15 MG/5ML PO SYRP: 5.0000 mL | ORAL_SOLUTION | Freq: Three times a day (TID) | ORAL | 0 refills | Status: DC | PRN

## 2017-04-25 MED ORDER — OXYMETAZOLINE HCL 0.05 % NA SOLN: 1.0000 | Freq: Two times a day (BID) | NASAL | 0 refills | Status: DC

## 2017-04-25 MED ORDER — FLUTICASONE PROPIONATE 50 MCG/ACT NA SUSP: 2.0000 | Freq: Every day | NASAL | 0 refills | Status: DC

## 2017-04-25 MED ORDER — AZITHROMYCIN 250 MG PO TABS: 250.0000 mg | ORAL_TABLET | Freq: Every day | ORAL | 0 refills | Status: DC

## 2017-04-25 NOTE — Progress Notes (Signed)
Subjective:  Patient ID: Melanie Alvarez, female    DOB: September 23, 1955  Age: 61 y.o. MRN: [id number]  CC: Cough (couhging and congestion for 3 days. BP was 130/110--last night--headache and sweat/ req note for work)   URI   This is a new problem. The current episode started in the past 7 days. The problem has been gradually worsening. Associated symptoms include congestion, coughing, a plugged ear sensation, rhinorrhea, sinus pain, sneezing and a sore throat. Pertinent negatives include no wheezing. She has tried decongestant for the symptoms. The treatment provided mild relief.   HTN: Stable with amlodipine, lisinopril/HCTZ.  Diabetes: Has not made chnages to diet, nor has she started any exercise regimen. Does not check glucose at home. Needs referral to eye exam.  Outpatient Medications Prior to Visit  Medication Sig Dispense Refill  . aspirin 81 MG chewable tablet Chew 81 mg by mouth daily.    Marland Kitchen amLODipine (NORVASC) 5 MG tablet Take 1 tablet (5 mg total) by mouth at bedtime. 90 tablet 1  . glimepiride (AMARYL) 1 MG tablet Take 1 tablet (1 mg total) by mouth daily with breakfast. 90 tablet 1  . lisinopril-hydrochlorothiazide (ZESTORETIC) 20-12.5 MG tablet Take 2 tablets by mouth daily. 180 tablet 1  . allopurinol (ZYLOPRIM) 100 MG tablet Take 1 tablet (100 mg total) by mouth 2 (two) times daily. (Patient not taking: Reported on 04/25/2017) 60 tablet 3  . cholecalciferol (VITAMIN D) 400 UNITS TABS tablet Take 400 Units by mouth daily.    . indomethacin (INDOCIN) 50 MG capsule Take 1 capsule (50 mg total) by mouth 3 (three) times daily as needed. (Patient not taking: Reported on 04/25/2017) 30 capsule 0   No facility-administered medications prior to visit.     ROS See HPI  Objective:  BP (!) 146/104   Pulse 88   Temp 98.3 F (36.8 C)   Ht 5\' 3"  (1.6 m)   Wt 251 lb (113.9 kg)   SpO2 97%   BMI 44.46 kg/m   BP Readings from Last 3 Encounters:  04/25/17 (!) 146/104  11/04/16  128/82  09/25/16 (!) 143/88    Wt Readings from Last 3 Encounters:  04/25/17 251 lb (113.9 kg)  11/04/16 263 lb (119.3 kg)  09/25/16 244 lb (110.7 kg)    Physical Exam  Constitutional: She is oriented to person, place, and time. No distress.  HENT:  Right Ear: Tympanic membrane, external ear and ear canal normal.  Left Ear: Tympanic membrane, external ear and ear canal normal.  Nose: Mucosal edema and rhinorrhea present. Right sinus exhibits maxillary sinus tenderness. Right sinus exhibits no frontal sinus tenderness. Left sinus exhibits maxillary sinus tenderness. Left sinus exhibits no frontal sinus tenderness.  Mouth/Throat: Uvula is midline. No trismus in the jaw. Posterior oropharyngeal erythema present. No oropharyngeal exudate.  Eyes: No scleral icterus.  Neck: Normal range of motion. Neck supple.  Cardiovascular: Normal rate and normal heart sounds.   Pulmonary/Chest: Effort normal and breath sounds normal. She has no wheezes. She has no rales.  Musculoskeletal: She exhibits no edema.  Lymphadenopathy:    She has no cervical adenopathy.  Neurological: She is alert and oriented to person, place, and time.  Skin: Skin is warm and dry.  Vitals reviewed.   Lab Results  Component Value Date   WBC 9.4 09/19/2014   HGB 15.0 09/19/2014   HCT 44.2 09/19/2014   PLT 249 09/19/2014   GLUCOSE 143 (H) 09/02/2016   CHOL 183 09/02/2016   TRIG  111.0 09/02/2016   HDL 49.20 09/02/2016   LDLDIRECT 110 (H) 09/30/2012   LDLCALC 111 (H) 09/02/2016   ALT 66 (H) 09/02/2016   AST 64 (H) 09/02/2016   NA 137 09/02/2016   K 4.0 09/02/2016   CL 96 09/02/2016   CREATININE 0.76 09/02/2016   BUN 13 09/02/2016   CO2 35 (H) 09/02/2016   TSH 1.959 03/19/2010   HGBA1C 6.5 09/02/2016   MICROALBUR 0.54 01/01/2010    Mm Digital Diagnostic Unilat L  Result Date: 02/27/2017 CLINICAL DATA:  61 year old patient recalled from recent screening mammogram for evaluation of calcifications in the left  breast. EXAM: DIGITAL DIAGNOSTIC LEFT MAMMOGRAM ULTRASOUND LEFT BREAST COMPARISON:  February 17, 2017 and multiple earlier priors dating back to July 25, 2006 ACR Breast Density Category b: There are scattered areas of fibroglandular density. FINDINGS: Patient was recalled for evaluation of calcifications within two mammographically stable periareolar nodules in the left breast. There are several coarse rounded calcifications within a stable 6 mm circumscribed nodule in the periareolar lower outer quadrant of the left breast. Multiple punctate and rounded calcifications are developing within an 11 mm stable circumscribed oval mass in the periareolar lower outer quadrant of the left breast. Targeted ultrasound is performed, showing 2 adjacent circumscribed hypoechoic nodules in the 4 o'clock position of the left breast 3-4 cm from the nipple containing similar-appearing hyperechoic foci within them, consistent with calcifications. The smaller nodule is 8 x 5 x 7 mm and contains multiple internal calcifications in the larger nodule is 10 x 9 x 4 mm and contains multiple internal calcifications. These account for the mammographically stable nodules with internal calcifications and are consistent with benign degenerating fibroadenomas. IMPRESSION: Calcifications in the left breast are within mammographically stable and sonographically benign appearing masses consistent with fibroadenomas. 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 No evidence of malignancy. RECOMMENDATION: Screening mammogram in one year.(Code:SM-B-01Y) I have discussed the findings and recommendations with the patient. Results were also provided in writing at the conclusion of the visit. If applicable, a reminder letter will be sent to the patient regarding the next appointment. BI-RADS CATEGORY  2: Benign. Electronically Signed   By: Curlene Dolphin M.D.   On: 02/27/2017 08:32   US Breast Ltd Uni Left Inc Axilla  Result Date:  02/27/2017 CLINICAL DATA:  61 year old patient recalled from recent screening mammogram for evaluation of calcifications in the left breast. EXAM: DIGITAL DIAGNOSTIC LEFT MAMMOGRAM ULTRASOUND LEFT BREAST COMPARISON:  February 17, 2017 and multiple earlier priors dating back to July 25, 2006 ACR Breast Density Category b: There are scattered areas of fibroglandular density. FINDINGS: Patient was recalled for evaluation of calcifications within two mammographically stable periareolar nodules in the left breast. There are several coarse rounded calcifications within a stable 6 mm circumscribed nodule in the periareolar lower outer quadrant of the left breast. Multiple punctate and rounded calcifications are developing within an 11 mm stable circumscribed oval mass in the periareolar lower outer quadrant of the left breast. Targeted ultrasound is performed, showing 2 adjacent circumscribed hypoechoic nodules in the 4 o'clock position of the left breast 3-4 cm from the nipple containing similar-appearing hyperechoic foci within them, consistent with calcifications. The smaller nodule is 8 x 5 x 7 mm and contains multiple internal calcifications in the larger nodule is 10 x 9 x 4 mm and contains multiple internal calcifications. These account for the mammographically stable nodules with internal calcifications and are consistent with benign degenerating fibroadenomas. IMPRESSION: Calcifications in the left  breast are within mammographically stable and sonographically benign appearing masses consistent with fibroadenomas. 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 No evidence of malignancy. RECOMMENDATION: Screening mammogram in one year.(Code:SM-B-01Y) I have discussed the findings and recommendations with the patient. Results were also provided in writing at the conclusion of the visit. If applicable, a reminder letter will be sent to the patient regarding the next appointment. BI-RADS CATEGORY  2:  Benign. Electronically Signed   By: Curlene Dolphin M.D.   On: 02/27/2017 08:32    Assessment & Plan:   Bret was seen today for cough.  Diagnoses and all orders for this visit:  Acute URI -     fluticasone (FLONASE) 50 MCG/ACT nasal spray; Place 2 sprays into both nostrils daily. -     oxymetazoline (AFRIN NASAL SPRAY) 0.05 % nasal spray; Place 1 spray into both nostrils 2 (two) times daily. Use only for 3days, then stop -     promethazine-dextromethorphan (PROMETHAZINE-DM) 6.25-15 MG/5ML syrup; Take 5 mLs by mouth 3 (three) times daily as needed for cough. -     azithromycin (ZITHROMAX Z-PAK) 250 MG tablet; Take 1 tablet (250 mg total) by mouth daily. Take 2tabs on first day, then 1tab once a day till complete  Prediabetes -     Ambulatory referral to Ophthalmology -     glimepiride (AMARYL) 1 MG tablet; Take 1 tablet (1 mg total) by mouth daily with breakfast.  Essential hypertension -     lisinopril-hydrochlorothiazide (ZESTORETIC) 20-12.5 MG tablet; Take 2 tablets by mouth daily. -     amLODipine (NORVASC) 5 MG tablet; Take 1 tablet (5 mg total) by mouth at bedtime.   I am having Ms. Cecchi start on fluticasone, oxymetazoline, promethazine-dextromethorphan, and azithromycin. I am also having her maintain her cholecalciferol, aspirin, indomethacin, allopurinol, glimepiride, lisinopril-hydrochlorothiazide, and amLODipine.  Meds ordered this encounter  Medications  . glimepiride (AMARYL) 1 MG tablet    Sig: Take 1 tablet (1 mg total) by mouth daily with breakfast.    Dispense:  90 tablet    Refill:  0    Order Specific Question:   Supervising Provider    Answer:   Cassandria Anger [[id number]]  . lisinopril-hydrochlorothiazide (ZESTORETIC) 20-12.5 MG tablet    Sig: Take 2 tablets by mouth daily.    Dispense:  180 tablet    Refill:  0    Order Specific Question:   Supervising Provider    Answer:   Cassandria Anger [[id number]]  . amLODipine (NORVASC) 5 MG tablet    Sig: Take 1  tablet (5 mg total) by mouth at bedtime.    Dispense:  90 tablet    Refill:  0    Order Specific Question:   Supervising Provider    Answer:   Cassandria Anger [[id number]]  . fluticasone (FLONASE) 50 MCG/ACT nasal spray    Sig: Place 2 sprays into both nostrils daily.    Dispense:  16 g    Refill:  0    Order Specific Question:   Supervising Provider    Answer:   Cassandria Anger [[id number]]  . oxymetazoline (AFRIN NASAL SPRAY) 0.05 % nasal spray    Sig: Place 1 spray into both nostrils 2 (two) times daily. Use only for 3days, then stop    Dispense:  30 mL    Refill:  0    Order Specific Question:   Supervising Provider    Answer:   Cassandria Anger [[id number]]  . promethazine-dextromethorphan (PROMETHAZINE-DM)  6.25-15 MG/5ML syrup    Sig: Take 5 mLs by mouth 3 (three) times daily as needed for cough.    Dispense:  240 mL    Refill:  0    Order Specific Question:   Supervising Provider    Answer:   Cassandria Anger [[id number]]  . azithromycin (ZITHROMAX Z-PAK) 250 MG tablet    Sig: Take 1 tablet (250 mg total) by mouth daily. Take 2tabs on first day, then 1tab once a day till complete    Dispense:  6 tablet    Refill:  0    Order Specific Question:   Supervising Provider    Answer:   Cassandria Anger [[id number]]    Follow-up: Return in about 3 months (around 07/25/2017) for CPE (fasting) at [place] location.  Wilfred Lacy, NP

## 2017-04-25 NOTE — Patient Instructions (Addendum)
URI Instructions: Flonase and Afrin use: apply 1spray of afrin in each nare, wait 31mins, then apply 2sprays of flonase in each nare. Use both nasal spray consecutively x 3days, then flonase only for at least 14days.  Encourage adequate oral hydration.  Use over-the-counter  "cold" medicines  such as "Tylenol cold" , "Advil cold",  "Mucinex" or" Mucinex D"  for cough and congestion.  Avoid decongestants if you have high blood pressure. Use" Delsym" or" Robitussin" cough syrup varietis for cough.  You can use plain "Tylenol" or "Advi"l for fever, chills and achyness.   "Common cold" symptoms are usually triggered by a virus.  The antibiotics are usually not necessary. On average, a" viral cold" illness would take 4-7 days to resolve. Please, make an appointment if you are not better or if you're worse.  Start azithromycin if no improvement in 3days.  [place] [facility]: [address], [place], [place]  [id number]

## 2017-06-30 ENCOUNTER — Ambulatory Visit (INDEPENDENT_AMBULATORY_CARE_PROVIDER_SITE_OTHER): Payer: BC Managed Care – PPO

## 2017-06-30 DIAGNOSIS — Z23 Encounter for immunization: Secondary | ICD-10-CM | POA: Diagnosis not present

## 2017-07-23 NOTE — Telephone Encounter (Signed)
ERROR

## 2017-08-05 ENCOUNTER — Encounter: Payer: Self-pay | Admitting: Nurse Practitioner

## 2017-08-05 ENCOUNTER — Ambulatory Visit: Payer: BC Managed Care – PPO | Admitting: Nurse Practitioner

## 2017-08-05 VITALS — BP 138/94 | HR 105 | Temp 97.4°F | Ht 63.0 in | Wt 250.0 lb

## 2017-08-05 DIAGNOSIS — M10471 Other secondary gout, right ankle and foot: Secondary | ICD-10-CM

## 2017-08-05 MED ORDER — TRAMADOL HCL 50 MG PO TABS: 50.0000 mg | ORAL_TABLET | Freq: Two times a day (BID) | ORAL | 0 refills | Status: DC | PRN

## 2017-08-05 MED ORDER — KETOROLAC TROMETHAMINE 30 MG/ML IJ SOLN
30.0000 mg | Freq: Once | INTRAMUSCULAR | Status: AC
  Administered 2017-08-05: 30 mg via INTRAMUSCULAR

## 2017-08-05 NOTE — Patient Instructions (Signed)
Use cold compress 2-3times a day. Elevate foot as much as possible. Encourage adequate oral hydration.  Use indocin and tramadol for pain as prescribed.  Gout Gout is painful swelling that can happen in some of your joints. Gout is a type of arthritis. This condition is caused by having too much uric acid in your body. Uric acid is a chemical that is made when your body breaks down substances called purines. If your body has too much uric acid, sharp crystals can form and build up in your joints. This causes pain and swelling. Gout attacks can happen quickly and be very painful (acute gout). Over time, the attacks can affect more joints and happen more often (chronic gout). Follow these instructions at home: During a Gout Attack  If directed, put ice on the painful area: ? Put ice in a plastic bag. ? Place a towel between your skin and the bag. ? Leave the ice on for 20 minutes, 2-3 times a day.  Rest the joint as much as possible. If the joint is in your leg, you may be given crutches to use.  Raise (elevate) the painful joint above the level of your heart as often as you can.  Drink enough fluids to keep your pee (urine) clear or pale yellow.  Take over-the-counter and prescription medicines only as told by your doctor.  Do not drive or use heavy machinery while taking prescription pain medicine.  Follow instructions from your doctor about what you can or cannot eat and drink.  Return to your normal activities as told by your doctor. Ask your doctor what activities are safe for you. Avoiding Future Gout Attacks  Follow a low-purine diet as told by a specialist (dietitian) or your doctor. Avoid foods and drinks that have a lot of purines, such as: ? Liver. ? Kidney. ? Anchovies. ? Asparagus. ? Herring. ? Mushrooms ? Mussels. ? Beer.  Limit alcohol intake to no more than 1 drink a day for nonpregnant women and 2 drinks a day for men. One drink equals 12 oz of beer, 5 oz of  wine, or 1 oz of hard liquor.  Stay at a healthy weight or lose weight if you are overweight. If you want to lose weight, talk with your doctor. It is important that you do not lose weight too fast.  Start or continue an exercise plan as told by your doctor.  Drink enough fluids to keep your pee clear or pale yellow.  Take over-the-counter and prescription medicines only as told by your doctor.  Keep all follow-up visits as told by your doctor. This is important. Contact a doctor if:  You have another gout attack.  You still have symptoms of a gout attack after10 days of treatment.  You have problems (side effects) because of your medicines.  You have chills or a fever.  You have burning pain when you pee (urinate).  You have pain in your lower back or belly. Get help right away if:  You have very bad pain.  Your pain cannot be controlled.  You cannot pee. This information is not intended to replace advice given to you by your health care provider. Make sure you discuss any questions you have with your health care provider. Document Released: 05/14/2008 Document Revised: 01/11/2016 Document Reviewed: 05/18/2015 Elsevier Interactive Patient Education  [employer].

## 2017-08-05 NOTE — Progress Notes (Signed)
Subjective:  Patient ID: Christiana Fuchs, female    DOB: 1955-10-12  Age: 61 y.o. MRN: [id number]  CC: Foot Swelling (right foot swelling and painful/ 4 days/ )   HPI   Right Great toe pain: Onset 4days ago after eating beef and pork. Symptoms: toe pain and swelling. Denies any injury.  Outpatient Medications Prior to Visit  Medication Sig Dispense Refill  . allopurinol (ZYLOPRIM) 100 MG tablet Take 1 tablet (100 mg total) by mouth 2 (two) times daily. 60 tablet 3  . amLODipine (NORVASC) 5 MG tablet Take 1 tablet (5 mg total) by mouth at bedtime. 90 tablet 0  . aspirin 81 MG chewable tablet Chew 81 mg by mouth daily.    . cholecalciferol (VITAMIN D) 400 UNITS TABS tablet Take 400 Units by mouth daily.    Marland Kitchen glimepiride (AMARYL) 1 MG tablet Take 1 tablet (1 mg total) by mouth daily with breakfast. 90 tablet 0  . indomethacin (INDOCIN) 50 MG capsule Take 1 capsule (50 mg total) by mouth 3 (three) times daily as needed. 30 capsule 0  . lisinopril-hydrochlorothiazide (ZESTORETIC) 20-12.5 MG tablet Take 2 tablets by mouth daily. 180 tablet 0  . fluticasone (FLONASE) 50 MCG/ACT nasal spray Place 2 sprays into both nostrils daily. 16 g 0  . oxymetazoline (AFRIN NASAL SPRAY) 0.05 % nasal spray Place 1 spray into both nostrils 2 (two) times daily. Use only for 3days, then stop 30 mL 0  . promethazine-dextromethorphan (PROMETHAZINE-DM) 6.25-15 MG/5ML syrup Take 5 mLs by mouth 3 (three) times daily as needed for cough. 240 mL 0  . azithromycin (ZITHROMAX Z-PAK) 250 MG tablet Take 1 tablet (250 mg total) by mouth daily. Take 2tabs on first day, then 1tab once a day till complete (Patient not taking: Reported on 08/05/2017) 6 tablet 0   No facility-administered medications prior to visit.     ROS See HPI  Objective:  BP (!) 138/94   Pulse (!) 105   Temp (!) 97.4 F (36.3 C)   Ht 5\' 3"  (1.6 m)   Wt 250 lb (113.4 kg)   SpO2 93%   BMI 44.29 kg/m   BP Readings from Last 3 Encounters:    08/05/17 (!) 138/94  04/25/17 (!) 146/104  11/04/16 128/82    Wt Readings from Last 3 Encounters:  08/05/17 250 lb (113.4 kg)  04/25/17 251 lb (113.9 kg)  11/04/16 263 lb (119.3 kg)    Physical Exam  Constitutional: She is oriented to person, place, and time. No distress.  Musculoskeletal: She exhibits edema and tenderness.       Feet:  Neurological: She is alert and oriented to person, place, and time.  Skin: Skin is warm and dry. There is erythema.  Vitals reviewed.   Lab Results  Component Value Date   WBC 9.4 09/19/2014   HGB 15.0 09/19/2014   HCT 44.2 09/19/2014   PLT 249 09/19/2014   GLUCOSE 143 (H) 09/02/2016   CHOL 183 09/02/2016   TRIG 111.0 09/02/2016   HDL 49.20 09/02/2016   LDLDIRECT 110 (H) 09/30/2012   LDLCALC 111 (H) 09/02/2016   ALT 66 (H) 09/02/2016   AST 64 (H) 09/02/2016   NA 137 09/02/2016   K 4.0 09/02/2016   CL 96 09/02/2016   CREATININE 0.76 09/02/2016   BUN 13 09/02/2016   CO2 35 (H) 09/02/2016   TSH 1.959 03/19/2010   HGBA1C 6.5 09/02/2016   MICROALBUR 0.54 01/01/2010    Mm Digital Diagnostic Unilat L  Result Date: 02/27/2017 CLINICAL DATA:  61 year old patient recalled from recent screening mammogram for evaluation of calcifications in the left breast. EXAM: DIGITAL DIAGNOSTIC LEFT MAMMOGRAM ULTRASOUND LEFT BREAST COMPARISON:  February 17, 2017 and multiple earlier priors dating back to July 25, 2006 ACR Breast Density Category b: There are scattered areas of fibroglandular density. FINDINGS: Patient was recalled for evaluation of calcifications within two mammographically stable periareolar nodules in the left breast. There are several coarse rounded calcifications within a stable 6 mm circumscribed nodule in the periareolar lower outer quadrant of the left breast. Multiple punctate and rounded calcifications are developing within an 11 mm stable circumscribed oval mass in the periareolar lower outer quadrant of the left breast. Targeted  ultrasound is performed, showing 2 adjacent circumscribed hypoechoic nodules in the 4 o'clock position of the left breast 3-4 cm from the nipple containing similar-appearing hyperechoic foci within them, consistent with calcifications. The smaller nodule is 8 x 5 x 7 mm and contains multiple internal calcifications in the larger nodule is 10 x 9 x 4 mm and contains multiple internal calcifications. These account for the mammographically stable nodules with internal calcifications and are consistent with benign degenerating fibroadenomas. IMPRESSION: Calcifications in the left breast are within mammographically stable and sonographically benign appearing masses consistent with fibroadenomas. 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 No evidence of malignancy. RECOMMENDATION: Screening mammogram in one year.(Code:SM-B-01Y) I have discussed the findings and recommendations with the patient. Results were also provided in writing at the conclusion of the visit. If applicable, a reminder letter will be sent to the patient regarding the next appointment. BI-RADS CATEGORY  2: Benign. Electronically Signed   By: Curlene Dolphin M.D.   On: 02/27/2017 08:32   US Breast Ltd Uni Left Inc Axilla  Result Date: 02/27/2017 CLINICAL DATA:  61 year old patient recalled from recent screening mammogram for evaluation of calcifications in the left breast. EXAM: DIGITAL DIAGNOSTIC LEFT MAMMOGRAM ULTRASOUND LEFT BREAST COMPARISON:  February 17, 2017 and multiple earlier priors dating back to July 25, 2006 ACR Breast Density Category b: There are scattered areas of fibroglandular density. FINDINGS: Patient was recalled for evaluation of calcifications within two mammographically stable periareolar nodules in the left breast. There are several coarse rounded calcifications within a stable 6 mm circumscribed nodule in the periareolar lower outer quadrant of the left breast. Multiple punctate and rounded calcifications  are developing within an 11 mm stable circumscribed oval mass in the periareolar lower outer quadrant of the left breast. Targeted ultrasound is performed, showing 2 adjacent circumscribed hypoechoic nodules in the 4 o'clock position of the left breast 3-4 cm from the nipple containing similar-appearing hyperechoic foci within them, consistent with calcifications. The smaller nodule is 8 x 5 x 7 mm and contains multiple internal calcifications in the larger nodule is 10 x 9 x 4 mm and contains multiple internal calcifications. These account for the mammographically stable nodules with internal calcifications and are consistent with benign degenerating fibroadenomas. IMPRESSION: Calcifications in the left breast are within mammographically stable and sonographically benign appearing masses consistent with fibroadenomas. 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 No evidence of malignancy. RECOMMENDATION: Screening mammogram in one year.(Code:SM-B-01Y) I have discussed the findings and recommendations with the patient. Results were also provided in writing at the conclusion of the visit. If applicable, a reminder letter will be sent to the patient regarding the next appointment. BI-RADS CATEGORY  2: Benign. Electronically Signed   By: Curlene Dolphin M.D.   On: 02/27/2017 08:32  Assessment & Plan:   Secily was seen today for foot swelling.  Diagnoses and all orders for this visit:  Acute gout due to other secondary cause involving toe of right foot -     ketorolac (TORADOL) 30 MG/ML injection 30 mg -     traMADol (ULTRAM) 50 MG tablet; Take 1 tablet (50 mg total) by mouth every 12 (twelve) hours as needed.   I have discontinued Kaylamarie L. Strite's fluticasone, oxymetazoline, promethazine-dextromethorphan, and azithromycin. I am also having her start on traMADol. Additionally, I am having her maintain her cholecalciferol, aspirin, indomethacin, allopurinol, glimepiride,  lisinopril-hydrochlorothiazide, and amLODipine. We administered ketorolac.  Meds ordered this encounter  Medications  . ketorolac (TORADOL) 30 MG/ML injection 30 mg  . traMADol (ULTRAM) 50 MG tablet    Sig: Take 1 tablet (50 mg total) by mouth every 12 (twelve) hours as needed.    Dispense:  6 tablet    Refill:  0    Order Specific Question:   Supervising Provider    Answer:   Lucille Passy [[id number]]    Follow-up: Return if symptoms worsen or fail to improve.  Wilfred Lacy, NP

## 2017-08-08 ENCOUNTER — Ambulatory Visit: Payer: BC Managed Care – PPO | Admitting: Nurse Practitioner

## 2017-08-15 IMAGING — US US ABDOMEN LIMITED
1 series · 14 of 25 positions shown · non-contrast
Comparison: Prior abdominal ultrasound [DATE]

CLINICAL DATA: 62-year-old female with a history of HCV cirrhosis.
Hepatocellular carcinoma screening evaluation.

EXAM:
ULTRASOUND ABDOMEN LIMITED RIGHT UPPER QUADRANT

[Series 1: us abdomen limited · 0.30mm/px · 14 of 35 slices shown]
[im 1/35]
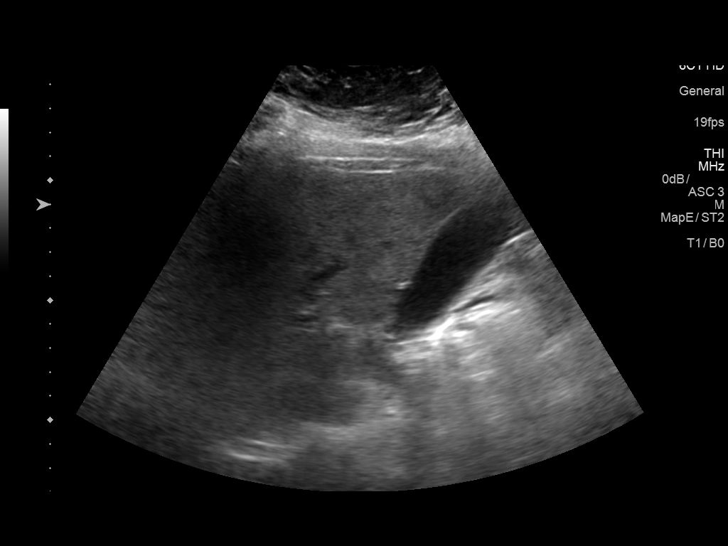
[im 3/35]
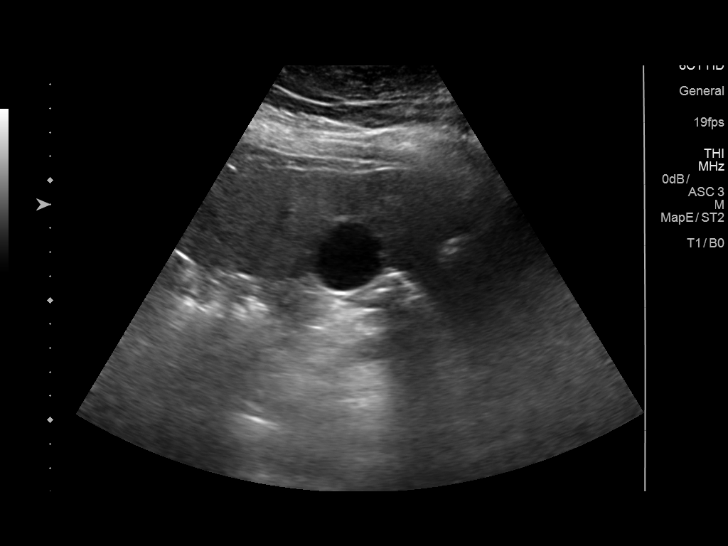
[im 6/35]
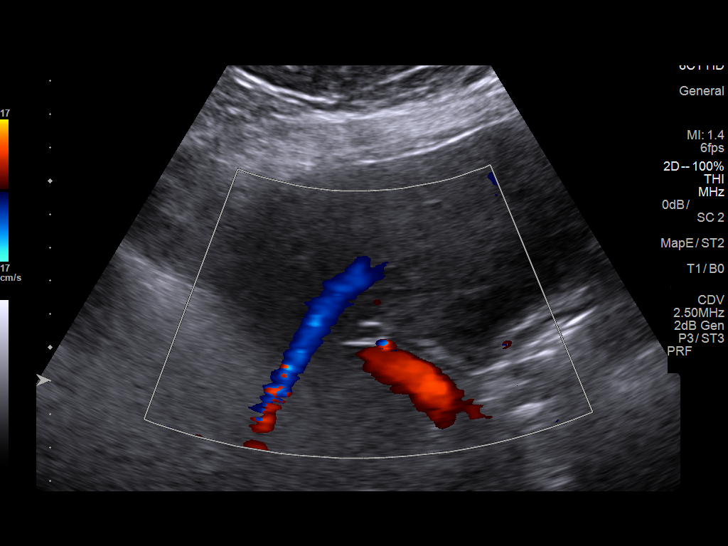
[im 9/35]
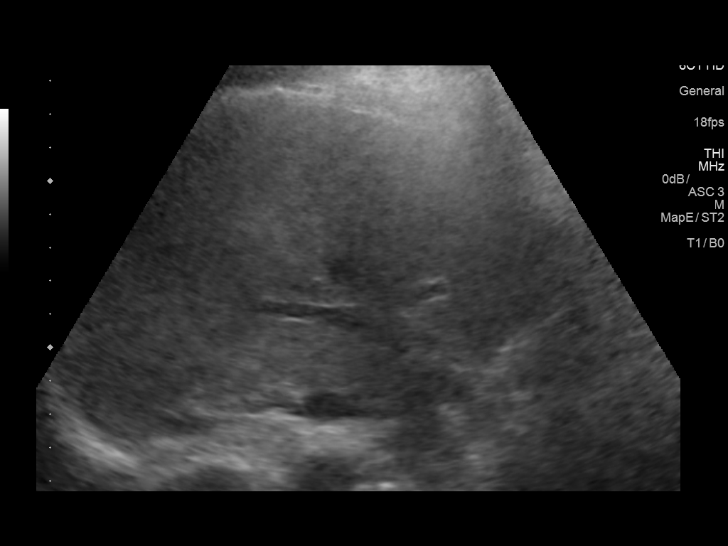
[im 12/35]
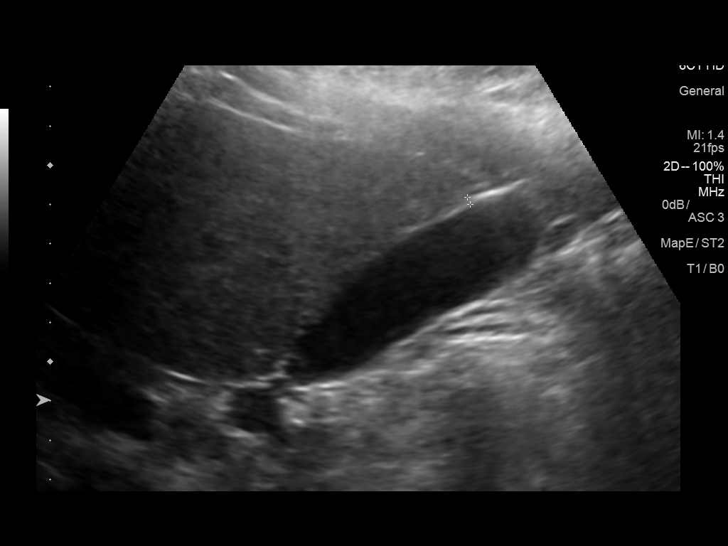
[im 13/35]
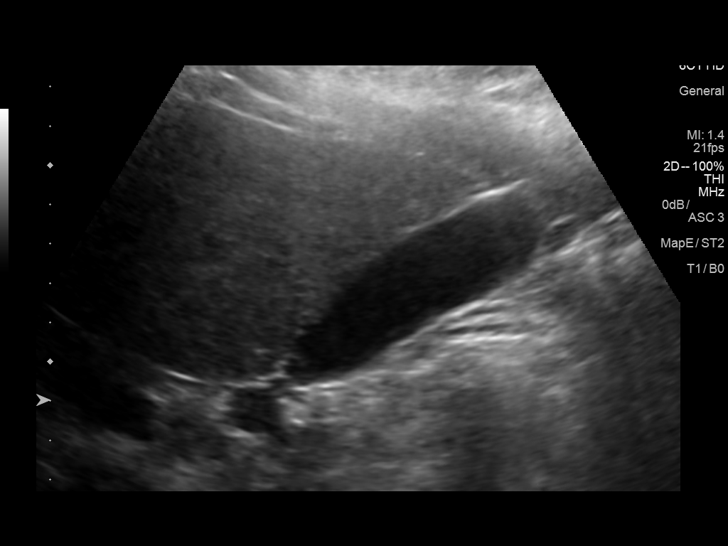
[im 16/35]
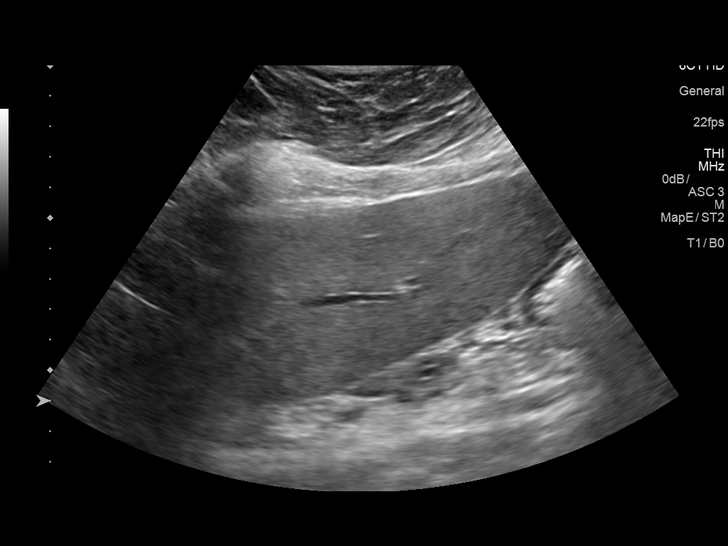
[im 19/35]
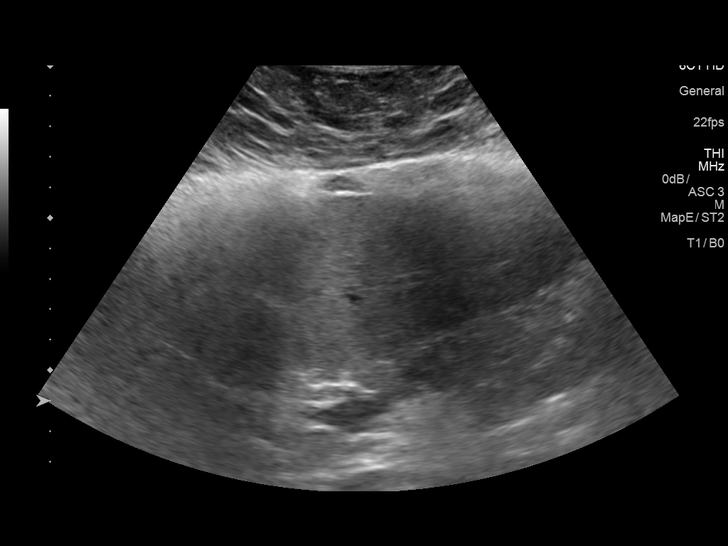
[im 22/35]
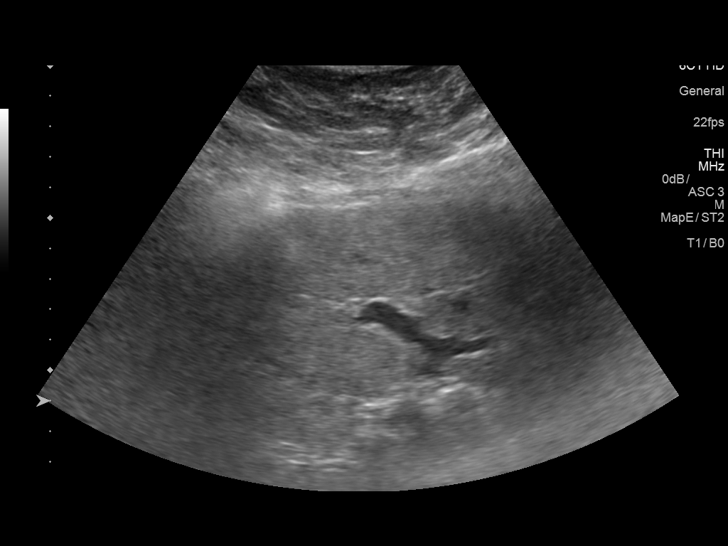
[im 23/35]
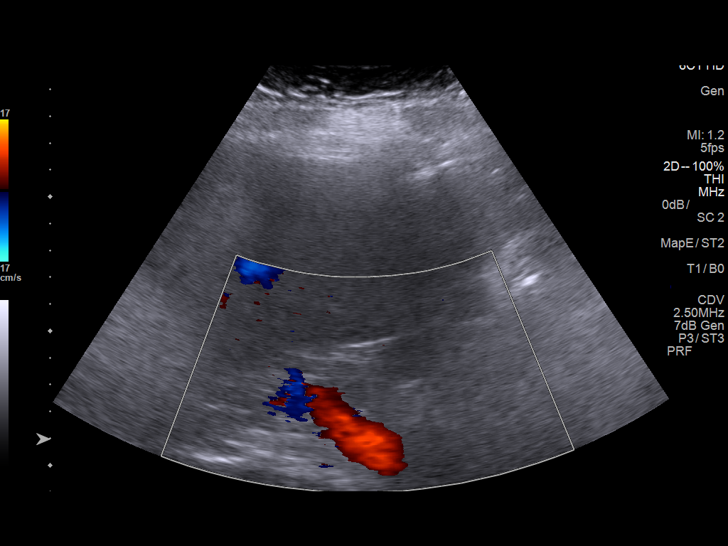
[im 26/35]
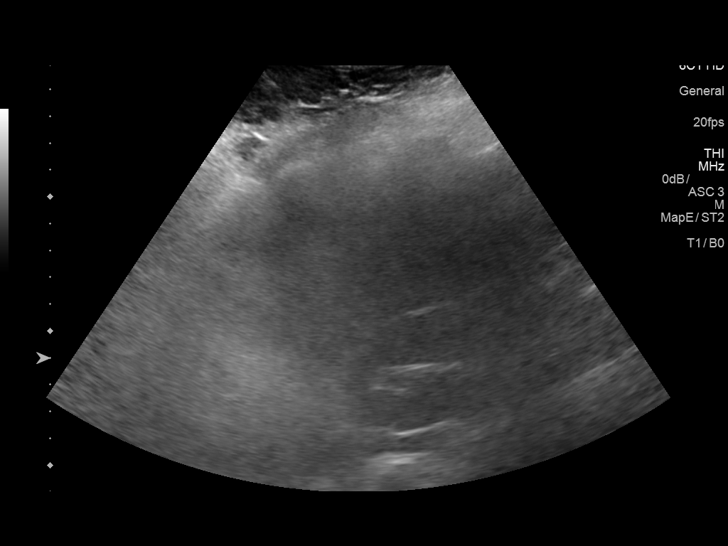
[im 29/35]
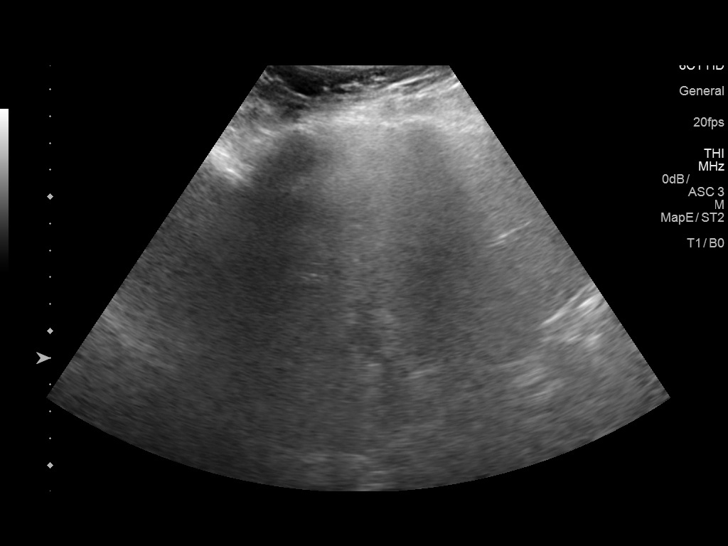
[im 32/35]
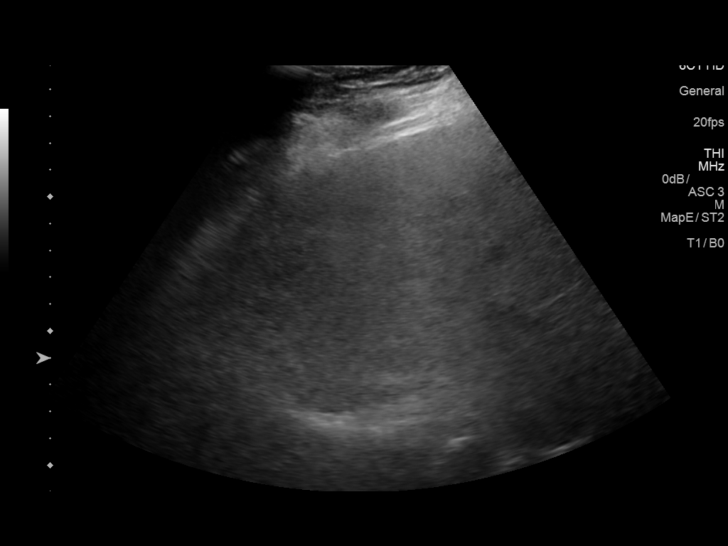
[im 35/35]
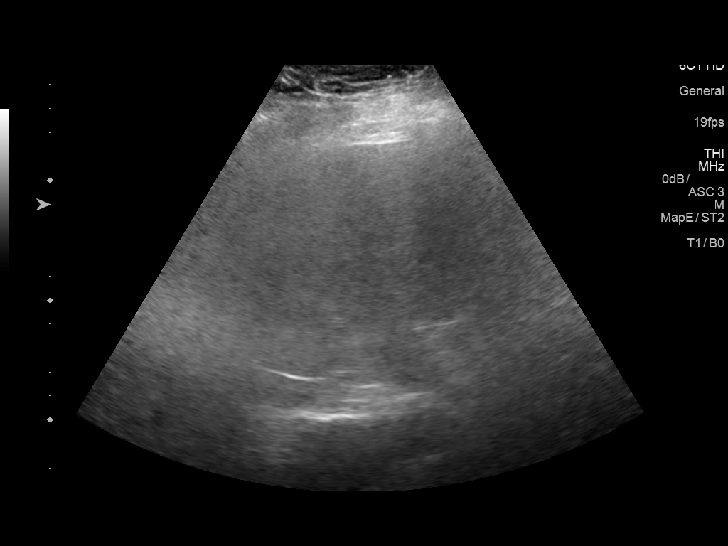

[14 of 25 positions shown; findings below may reference images not displayed]

FINDINGS: Gallbladder:

No gallstones or wall thickening visualized. No sonographic Murphy
sign noted by sonographer.

Common bile duct:

Diameter: Normal at 3 mm

Liver:

No focal hepatic lesion identified. The liver remains heterogeneous
and coarsened in echotexture. Overall, the echogenicity is
hyperechoic. The contour is faintly nodular. Portal vein is patent
on color Doppler imaging with normal direction of blood flow towards
the liver.
IMPRESSION: 1. Hepatic cirrhosis.
2. No discrete hepatic lesion identified.

## 2017-08-30 ENCOUNTER — Other Ambulatory Visit: Payer: Self-pay | Admitting: Nurse Practitioner

## 2017-08-30 DIAGNOSIS — R7303 Prediabetes: Secondary | ICD-10-CM

## 2017-09-22 ENCOUNTER — Encounter: Payer: Self-pay | Admitting: Nurse Practitioner

## 2017-09-22 ENCOUNTER — Ambulatory Visit: Payer: BC Managed Care – PPO | Admitting: Nurse Practitioner

## 2017-09-22 VITALS — BP 146/88 | HR 87 | Temp 98.0°F | Ht 63.0 in | Wt 248.0 lb

## 2017-09-22 DIAGNOSIS — I10 Essential (primary) hypertension: Secondary | ICD-10-CM | POA: Diagnosis not present

## 2017-09-22 DIAGNOSIS — R112 Nausea with vomiting, unspecified: Secondary | ICD-10-CM | POA: Diagnosis not present

## 2017-09-22 DIAGNOSIS — M109 Gout, unspecified: Secondary | ICD-10-CM

## 2017-09-22 DIAGNOSIS — R1084 Generalized abdominal pain: Secondary | ICD-10-CM

## 2017-09-22 DIAGNOSIS — R197 Diarrhea, unspecified: Secondary | ICD-10-CM | POA: Diagnosis not present

## 2017-09-22 LAB — POCT URINALYSIS DIPSTICK
BILIRUBIN UA: NEGATIVE
Blood, UA: NEGATIVE
Glucose, UA: NEGATIVE
KETONES UA: NEGATIVE
Nitrite, UA: NEGATIVE
PH UA: 6 (ref 5.0–8.0)
Spec Grav, UA: 1.025 (ref 1.010–1.025)
Urobilinogen, UA: 0.2 E.U./dL

## 2017-09-22 LAB — GLUCOSE, POCT (MANUAL RESULT ENTRY): POC Glucose: 144 mg/dl — AB (ref 70–99)

## 2017-09-22 MED ORDER — AMLODIPINE BESYLATE 5 MG PO TABS: 5.0000 mg | ORAL_TABLET | Freq: Every day | ORAL | 1 refills | Status: DC

## 2017-09-22 MED ORDER — BISMUTH SUBSALICYLATE 262 MG/15ML PO SUSP: 30.0000 mL | Freq: Four times a day (QID) | ORAL | 0 refills | Status: DC | PRN

## 2017-09-22 MED ORDER — PROMETHAZINE HCL 12.5 MG PO TABS: 12.5000 mg | ORAL_TABLET | Freq: Three times a day (TID) | ORAL | 0 refills | Status: DC | PRN

## 2017-09-22 MED ORDER — ALLOPURINOL 100 MG PO TABS: 100.0000 mg | ORAL_TABLET | Freq: Two times a day (BID) | ORAL | 1 refills | Status: DC

## 2017-09-22 MED ORDER — LISINOPRIL-HYDROCHLOROTHIAZIDE 20-12.5 MG PO TABS: 2.0000 | ORAL_TABLET | Freq: Every day | ORAL | 1 refills | Status: DC

## 2017-09-22 NOTE — Patient Instructions (Addendum)
Hold amaryl while nausea and vomiting persist. Push oral hydration with water and pedialyte. Follow BRAT diet and advance diet as tolerated.  Return to office if no improvement in 1week.  Food Choices to Help Relieve Diarrhea, Adult When you have diarrhea, the foods you eat and your eating habits are very important. Choosing the right foods and drinks can help:  Relieve diarrhea.  Replace lost fluids and nutrients.  Prevent dehydration.  What general guidelines should I follow? Relieving diarrhea  Choose foods with less than 2 g or .07 oz. of fiber per serving.  Limit fats to less than 8 tsp (38 g or 1.34 oz.) a day.  Avoid the following: ? Foods and beverages sweetened with high-fructose corn syrup, honey, or sugar alcohols such as xylitol, sorbitol, and mannitol. ? Foods that contain a lot of fat or sugar. ? Fried, greasy, or spicy foods. ? High-fiber grains, breads, and cereals. ? Raw fruits and vegetables.  Eat foods that are rich in probiotics. These foods include dairy products such as yogurt and fermented milk products. They help increase healthy bacteria in the stomach and intestines (gastrointestinal tract, or GI tract).  If you have lactose intolerance, avoid dairy products. These may make your diarrhea worse.  Take medicine to help stop diarrhea (antidiarrheal medicine) only as told by your health care provider. Replacing nutrients  Eat small meals or snacks every 3-4 hours.  Eat bland foods, such as white rice, toast, or baked potato, until your diarrhea starts to get better. Gradually reintroduce nutrient-rich foods as tolerated or as told by your health care provider. This includes: ? Well-cooked protein foods. ? Peeled, seeded, and soft-cooked fruits and vegetables. ? Low-fat dairy products.  Take vitamin and mineral supplements as told by your health care provider. Preventing dehydration   Start by sipping water or a special solution to prevent  dehydration (oral rehydration solution, ORS). Urine that is clear or pale yellow means that you are getting enough fluid.  Try to drink at least 8-10 cups of fluid each day to help replace lost fluids.  You may add other liquids in addition to water, such as clear juice or decaffeinated sports drinks, as tolerated or as told by your health care provider.  Avoid drinks with caffeine, such as coffee, tea, or soft drinks.  Avoid alcohol. What foods are recommended? The items listed may not be a complete list. Talk with your health care provider about what dietary choices are best for you. Grains White rice. White, [place], or pita breads (fresh or toasted), including plain rolls, buns, or bagels. White pasta. Saltine, soda, or graham crackers. Pretzels. Low-fiber cereal. Cooked cereals made with water (such as cornmeal, farina, or cream cereals). Plain muffins. Matzo. Melba toast. Zwieback. Vegetables Potatoes (without the skin). Most well-cooked and canned vegetables without skins or seeds. Tender lettuce. Fruits Apple sauce. Fruits canned in juice. Cooked apricots, cherries, grapefruit, peaches, pears, or plums. Fresh bananas and cantaloupe. Meats and other protein foods Baked or boiled chicken. Eggs. Tofu. Fish. Seafood. Smooth nut butters. Ground or well-cooked tender beef, ham, veal, lamb, pork, or poultry. Dairy Plain yogurt, kefir, and unsweetened liquid yogurt. Lactose-free milk, buttermilk, skim milk, or soy milk. Low-fat or nonfat hard cheese. Beverages Water. Low-calorie sports drinks. Fruit juices without pulp. Strained tomato and vegetable juices. Decaffeinated teas. Sugar-free beverages not sweetened with sugar alcohols. Oral rehydration solutions, if approved by your health care provider. Seasoning and other foods Bouillon, broth, or soups made from recommended foods. What  foods are not recommended? The items listed may not be a complete list. Talk with your health care provider  about what dietary choices are best for you. Grains Whole grain, whole wheat, bran, or rye breads, rolls, pastas, and crackers. Wild or brown rice. Whole grain or bran cereals. Barley. Oats and oatmeal. Corn tortillas or taco shells. Granola. Popcorn. Vegetables Raw vegetables. Fried vegetables. Cabbage, broccoli, Brussels sprouts, artichokes, baked beans, beet greens, corn, kale, legumes, peas, sweet potatoes, and yams. Potato skins. Cooked spinach and cabbage. Fruits Dried fruit, including raisins and dates. Raw fruits. Stewed or dried prunes. Canned fruits with syrup. Meat and other protein foods Fried or fatty meats. Deli meats. Chunky nut butters. Nuts and seeds. Beans and lentils. Berniece Salines. Hot dogs. Sausage. Dairy High-fat cheeses. Whole milk, chocolate milk, and beverages made with milk, such as milk shakes. Half-and-half. Cream. sour cream. Ice cream. Beverages Caffeinated beverages (such as coffee, tea, soda, or energy drinks). Alcoholic beverages. Fruit juices with pulp. Prune juice. Soft drinks sweetened with high-fructose corn syrup or sugar alcohols. High-calorie sports drinks. Fats and oils Butter. Cream sauces. Margarine. Salad oils. Plain salad dressings. Olives. Avocados. Mayonnaise. Sweets and desserts Sweet rolls, doughnuts, and sweet breads. Sugar-free desserts sweetened with sugar alcohols such as xylitol and sorbitol. Seasoning and other foods Honey. Hot sauce. Chili powder. Gravy. Cream-based or milk-based soups. Pancakes and waffles. Summary  When you have diarrhea, the foods you eat and your eating habits are very important.  Make sure you get at least 8-10 cups of fluid each day, or enough to keep your urine clear or pale yellow.  Eat bland foods and gradually reintroduce healthy, nutrient-rich foods as tolerated, or as told by your health care provider.  Avoid high-fiber, fried, greasy, or spicy foods. This information is not intended to replace advice given to  you by your health care provider. Make sure you discuss any questions you have with your health care provider. Document Released: 10/26/2003 Document Revised: 08/02/2016 Document Reviewed: 08/02/2016 Elsevier Interactive Patient Education  [employer].

## 2017-09-22 NOTE — Progress Notes (Signed)
Subjective:  Patient ID: Melanie Alvarez, female    DOB: 1956/04/20  Age: 62 y.o. MRN: [id number]  CC: Nausea (nausea and diarrhea/headache/ 1 wk---req doctor note out of work today and trmw?)  Abdominal Pain  This is a new problem. The current episode started in the past 7 days. The onset quality is gradual. The problem occurs constantly. The problem has been unchanged. The pain is located in the generalized abdominal region. The quality of the pain is colicky, dull, a sensation of fullness and cramping. The abdominal pain does not radiate. Associated symptoms include anorexia, diarrhea, headaches, nausea and vomiting. Pertinent negatives include no arthralgias, belching, constipation, dysuria, fever, flatus, frequency, hematochezia, hematuria, melena, myalgias or weight loss. The pain is aggravated by eating. The pain is relieved by nothing. She has tried antacids for the symptoms. The treatment provided no relief.   Outpatient Medications Prior to Visit  Medication Sig Dispense Refill  . aspirin 81 MG chewable tablet Chew 81 mg by mouth daily.    . cholecalciferol (VITAMIN D) 400 UNITS TABS tablet Take 400 Units by mouth daily.    Marland Kitchen glimepiride (AMARYL) 1 MG tablet Take 1 tablet (1 mg total) by mouth daily with breakfast. 90 tablet 0  . traMADol (ULTRAM) 50 MG tablet Take 1 tablet (50 mg total) by mouth every 12 (twelve) hours as needed. 6 tablet 0  . allopurinol (ZYLOPRIM) 100 MG tablet Take 1 tablet (100 mg total) by mouth 2 (two) times daily. 60 tablet 3  . amLODipine (NORVASC) 5 MG tablet Take 1 tablet (5 mg total) by mouth at bedtime. 90 tablet 0  . indomethacin (INDOCIN) 50 MG capsule Take 1 capsule (50 mg total) by mouth 3 (three) times daily as needed. 30 capsule 0  . lisinopril-hydrochlorothiazide (ZESTORETIC) 20-12.5 MG tablet Take 2 tablets by mouth daily. 180 tablet 0   No facility-administered medications prior to visit.     ROS See HPI  Objective:  BP (!) 146/88    Pulse 87   Temp 98 F (36.7 C)   Ht 5\' 3"  (1.6 m)   Wt 248 lb (112.5 kg)   SpO2 95%   BMI 43.93 kg/m   BP Readings from Last 3 Encounters:  09/22/17 (!) 146/88  08/05/17 (!) 138/94  04/25/17 (!) 146/104    Wt Readings from Last 3 Encounters:  09/22/17 248 lb (112.5 kg)  08/05/17 250 lb (113.4 kg)  04/25/17 251 lb (113.9 kg)    Physical Exam  Constitutional: She is oriented to person, place, and time. No distress.  Cardiovascular: Normal rate.  Pulmonary/Chest: Effort normal.  Abdominal: Soft. Bowel sounds are normal. She exhibits no distension. There is tenderness. There is no rebound and no guarding.  Neurological: She is alert and oriented to person, place, and time.  Skin: Skin is warm and dry.  Psychiatric: She has a normal mood and affect. Her behavior is normal.  Vitals reviewed.   Lab Results  Component Value Date   WBC 9.4 09/19/2014   HGB 15.0 09/19/2014   HCT 44.2 09/19/2014   PLT 249 09/19/2014   GLUCOSE 143 (H) 09/02/2016   CHOL 183 09/02/2016   TRIG 111.0 09/02/2016   HDL 49.20 09/02/2016   LDLDIRECT 110 (H) 09/30/2012   LDLCALC 111 (H) 09/02/2016   ALT 66 (H) 09/02/2016   AST 64 (H) 09/02/2016   NA 137 09/02/2016   K 4.0 09/02/2016   CL 96 09/02/2016   CREATININE 0.76 09/02/2016   BUN 13  09/02/2016   CO2 35 (H) 09/02/2016   TSH 1.959 03/19/2010   HGBA1C 6.5 09/02/2016   MICROALBUR 0.54 01/01/2010    Mm Digital Diagnostic Unilat L  Result Date: 02/27/2017 CLINICAL DATA:  62 year old patient recalled from recent screening mammogram for evaluation of calcifications in the left breast. EXAM: DIGITAL DIAGNOSTIC LEFT MAMMOGRAM ULTRASOUND LEFT BREAST COMPARISON:  February 17, 2017 and multiple earlier priors dating back to July 25, 2006 ACR Breast Density Category b: There are scattered areas of fibroglandular density. FINDINGS: Patient was recalled for evaluation of calcifications within two mammographically stable periareolar nodules in the left  breast. There are several coarse rounded calcifications within a stable 6 mm circumscribed nodule in the periareolar lower outer quadrant of the left breast. Multiple punctate and rounded calcifications are developing within an 11 mm stable circumscribed oval mass in the periareolar lower outer quadrant of the left breast. Targeted ultrasound is performed, showing 2 adjacent circumscribed hypoechoic nodules in the 4 o'clock position of the left breast 3-4 cm from the nipple containing similar-appearing hyperechoic foci within them, consistent with calcifications. The smaller nodule is 8 x 5 x 7 mm and contains multiple internal calcifications in the larger nodule is 10 x 9 x 4 mm and contains multiple internal calcifications. These account for the mammographically stable nodules with internal calcifications and are consistent with benign degenerating fibroadenomas. IMPRESSION: Calcifications in the left breast are within mammographically stable and sonographically benign appearing masses consistent with fibroadenomas. 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 No evidence of malignancy. RECOMMENDATION: Screening mammogram in one year.(Code:SM-B-01Y) I have discussed the findings and recommendations with the patient. Results were also provided in writing at the conclusion of the visit. If applicable, a reminder letter will be sent to the patient regarding the next appointment. BI-RADS CATEGORY  2: Benign. Electronically Signed   By: Curlene Dolphin M.D.   On: 02/27/2017 08:32   US Breast Ltd Uni Left Inc Axilla  Result Date: 02/27/2017 CLINICAL DATA:  62 year old patient recalled from recent screening mammogram for evaluation of calcifications in the left breast. EXAM: DIGITAL DIAGNOSTIC LEFT MAMMOGRAM ULTRASOUND LEFT BREAST COMPARISON:  February 17, 2017 and multiple earlier priors dating back to July 25, 2006 ACR Breast Density Category b: There are scattered areas of fibroglandular density.  FINDINGS: Patient was recalled for evaluation of calcifications within two mammographically stable periareolar nodules in the left breast. There are several coarse rounded calcifications within a stable 6 mm circumscribed nodule in the periareolar lower outer quadrant of the left breast. Multiple punctate and rounded calcifications are developing within an 11 mm stable circumscribed oval mass in the periareolar lower outer quadrant of the left breast. Targeted ultrasound is performed, showing 2 adjacent circumscribed hypoechoic nodules in the 4 o'clock position of the left breast 3-4 cm from the nipple containing similar-appearing hyperechoic foci within them, consistent with calcifications. The smaller nodule is 8 x 5 x 7 mm and contains multiple internal calcifications in the larger nodule is 10 x 9 x 4 mm and contains multiple internal calcifications. These account for the mammographically stable nodules with internal calcifications and are consistent with benign degenerating fibroadenomas. IMPRESSION: Calcifications in the left breast are within mammographically stable and sonographically benign appearing masses consistent with fibroadenomas. 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 No evidence of malignancy. RECOMMENDATION: Screening mammogram in one year.(Code:SM-B-01Y) I have discussed the findings and recommendations with the patient. Results were also provided in writing at the conclusion of the visit. If applicable, a  reminder letter will be sent to the patient regarding the next appointment. BI-RADS CATEGORY  2: Benign. Electronically Signed   By: Curlene Dolphin M.D.   On: 02/27/2017 08:32    Assessment & Plan:   Melanie Alvarez was seen today for nausea.  Diagnoses and all orders for this visit:  Nausea and vomiting, intractability of vomiting not specified, unspecified vomiting type -     POCT urinalysis dipstick -     POCT Glucose (CBG) -     promethazine (PHENERGAN) 12.5 MG  tablet; Take 1 tablet (12.5 mg total) by mouth every 8 (eight) hours as needed for nausea or vomiting. -     bismuth subsalicylate (PEPTO-BISMOL) 262 MG/15ML suspension; Take 30 mLs by mouth every 6 (six) hours as needed. -     omeprazole (PRILOSEC) 20 MG capsule; Take 1 capsule (20 mg total) by mouth daily.  Diarrhea, unspecified type -     POCT urinalysis dipstick -     promethazine (PHENERGAN) 12.5 MG tablet; Take 1 tablet (12.5 mg total) by mouth every 8 (eight) hours as needed for nausea or vomiting. -     bismuth subsalicylate (PEPTO-BISMOL) 262 MG/15ML suspension; Take 30 mLs by mouth every 6 (six) hours as needed.  Generalized abdominal pain -     POCT urinalysis dipstick -     POCT Glucose (CBG) -     promethazine (PHENERGAN) 12.5 MG tablet; Take 1 tablet (12.5 mg total) by mouth every 8 (eight) hours as needed for nausea or vomiting. -     bismuth subsalicylate (PEPTO-BISMOL) 262 MG/15ML suspension; Take 30 mLs by mouth every 6 (six) hours as needed. -     omeprazole (PRILOSEC) 20 MG capsule; Take 1 capsule (20 mg total) by mouth daily.  Essential hypertension -     amLODipine (NORVASC) 5 MG tablet; Take 1 tablet (5 mg total) by mouth at bedtime. -     lisinopril-hydrochlorothiazide (ZESTORETIC) 20-12.5 MG tablet; Take 2 tablets by mouth daily.  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   I have discontinued Devanshi L. Dery's indomethacin. I am also having her start on promethazine, bismuth subsalicylate, and omeprazole. Additionally, I am having her maintain her cholecalciferol, aspirin, traMADol, glimepiride, amLODipine, allopurinol, and lisinopril-hydrochlorothiazide.  Meds ordered this encounter  Medications  . amLODipine (NORVASC) 5 MG tablet    Sig: Take 1 tablet (5 mg total) by mouth at bedtime.    Dispense:  90 tablet    Refill:  1    Order Specific Question:    Supervising Provider    Answer:   Lucille Passy [[id number]]  . allopurinol (ZYLOPRIM) 100 MG tablet    Sig: Take 1 tablet (100 mg total) by mouth 2 (two) times daily.    Dispense:  180 tablet    Refill:  1    Order Specific Question:   Supervising Provider    Answer:   Lucille Passy [[id number]]  . lisinopril-hydrochlorothiazide (ZESTORETIC) 20-12.5 MG tablet    Sig: Take 2 tablets by mouth daily.    Dispense:  180 tablet    Refill:  1    Order Specific Question:   Supervising Provider    Answer:   Lucille Passy [[id number]]  . promethazine (PHENERGAN) 12.5 MG tablet    Sig: Take 1 tablet (12.5 mg total) by mouth every 8 (eight) hours as needed  for nausea or vomiting.    Dispense:  20 tablet    Refill:  0    Order Specific Question:   Supervising Provider    Answer:   Lucille Passy [[id number]]  . bismuth subsalicylate (PEPTO-BISMOL) 262 MG/15ML suspension    Sig: Take 30 mLs by mouth every 6 (six) hours as needed.    Dispense:  360 mL    Refill:  0    Order Specific Question:   Supervising Provider    Answer:   Lucille Passy [[id number]]  . omeprazole (PRILOSEC) 20 MG capsule    Sig: Take 1 capsule (20 mg total) by mouth daily.    Dispense:  30 capsule    Refill:  0    Order Specific Question:   Supervising Provider    Answer:   Lucille Passy [[id number]]    Follow-up: Return in about 4 weeks (around 10/20/2017) for CPE (fasting).  Wilfred Lacy, NP

## 2017-09-23 ENCOUNTER — Encounter: Payer: Self-pay | Admitting: Nurse Practitioner

## 2017-09-23 MED ORDER — OMEPRAZOLE 20 MG PO CPDR: 20.0000 mg | DELAYED_RELEASE_CAPSULE | Freq: Every day | ORAL | 0 refills | Status: DC

## 2017-09-25 ENCOUNTER — Telehealth: Payer: Self-pay | Admitting: Nurse Practitioner

## 2017-09-25 NOTE — Telephone Encounter (Addendum)
Copied from [place] [id number]. Topic: Quick Communication - See Telephone Encounter >> Sep 25, 2017  4:14 PM Ivar Drape wrote: CRM for notification. See Telephone encounter for:  09/25/17. Patient stated she went to pick up her prescriptions from her office visit on 09/22/17 and when she got home she had a prescription that she hasn't taken in over a year allopurinol (ZYLOPRIM) 100 MG tablet, but she has already paid for it.  She wants to know what it's for and why the provider put that medication back on her med list.

## 2017-09-26 NOTE — Telephone Encounter (Signed)
Pt report not taking this med for a while now, she was wondering if Melanie Alvarez wants her to continue taking this med. Please advise.

## 2017-09-26 NOTE — Telephone Encounter (Signed)
Pt is aware.  

## 2017-09-26 NOTE — Telephone Encounter (Signed)
yes

## 2017-09-26 NOTE — Telephone Encounter (Signed)
Left vm for the pt to call call back.

## 2017-10-17 ENCOUNTER — Ambulatory Visit: Payer: BC Managed Care – PPO | Admitting: Nurse Practitioner

## 2017-10-17 ENCOUNTER — Encounter: Payer: Self-pay | Admitting: Nurse Practitioner

## 2017-10-17 VITALS — BP 142/96 | HR 104 | Temp 99.0°F | Ht 63.0 in | Wt 248.0 lb

## 2017-10-17 DIAGNOSIS — M109 Gout, unspecified: Secondary | ICD-10-CM | POA: Diagnosis not present

## 2017-10-17 DIAGNOSIS — R6889 Other general symptoms and signs: Secondary | ICD-10-CM

## 2017-10-17 DIAGNOSIS — E876 Hypokalemia: Secondary | ICD-10-CM | POA: Diagnosis not present

## 2017-10-17 DIAGNOSIS — I10 Essential (primary) hypertension: Secondary | ICD-10-CM

## 2017-10-17 DIAGNOSIS — E118 Type 2 diabetes mellitus with unspecified complications: Secondary | ICD-10-CM

## 2017-10-17 LAB — POC INFLUENZA A&B (BINAX/QUICKVUE)
INFLUENZA A, POC: POSITIVE — AB
Influenza B, POC: NEGATIVE

## 2017-10-17 MED ORDER — OSELTAMIVIR PHOSPHATE 75 MG PO CAPS: 75.0000 mg | ORAL_CAPSULE | Freq: Two times a day (BID) | ORAL | 0 refills | Status: DC

## 2017-10-17 MED ORDER — ALBUTEROL SULFATE HFA 108 (90 BASE) MCG/ACT IN AERS: 1.0000 | INHALATION_SPRAY | Freq: Four times a day (QID) | RESPIRATORY_TRACT | 0 refills | Status: DC | PRN

## 2017-10-17 MED ORDER — OXYMETAZOLINE HCL 0.05 % NA SOLN: 1.0000 | Freq: Two times a day (BID) | NASAL | 0 refills | Status: DC

## 2017-10-17 MED ORDER — PROMETHAZINE-DM 6.25-15 MG/5ML PO SYRP: 5.0000 mL | ORAL_SOLUTION | Freq: Three times a day (TID) | ORAL | 0 refills | Status: DC | PRN

## 2017-10-17 MED ORDER — FLUTICASONE PROPIONATE 50 MCG/ACT NA SUSP: 2.0000 | Freq: Every day | NASAL | 0 refills | Status: DC

## 2017-10-17 MED ORDER — GUAIFENESIN ER 600 MG PO TB12
600.0000 mg | ORAL_TABLET | Freq: Two times a day (BID) | ORAL | 0 refills | Status: DC | PRN
Start: 2017-10-17 — End: 2017-11-05

## 2017-10-17 NOTE — Patient Instructions (Addendum)
URI Instructions: Flonase and Afrin use: apply 1spray of afrin in each nare, wait 3mins, then apply 2sprays of flonase in each nare. Use both nasal spray consecutively x 3days, then flonase only for at least 14days.  Encourage adequate oral hydration.  Use over-the-counter  "cold" medicines  such as "Tylenol cold" , "Advil cold",  "Mucinex" or" Mucinex D"  for cough and congestion.  Avoid decongestants if you have high blood pressure. Use" Delsym" or" Robitussin" cough syrup varietis for cough.  You can use plain "Tylenol" or "Advi"l for fever, chills and achyness.   "Common cold" symptoms are usually triggered by a virus.  The antibiotics are usually not necessary. On average, a" viral cold" illness would take 4-7 days to resolve. Please, make an appointment if you are not better or if you're worse.  Persistent elevated Hgb A1c at 6.8. Normal urine microalbumin. Abnormal hepatic function due to hepatitis C diagnosis. You need to make appt with Dr. Henreitta Leber office as previously discussed. Stable renal function with mild decrease in potassium. This is probably due to recent acute illness and use of HCTZ. Sent potassium supplement. Improved uric acid level from 8.5 to 7.0. Continue allopurinol as prescribed.

## 2017-10-17 NOTE — Progress Notes (Signed)
Subjective:  Patient ID: Melanie Alvarez, female    DOB: 1956-06-12  Age: 62 y.o. MRN: [id number]  CC: Cough (dry cough,runny nose/1 day)   URI   This is a new problem. The current episode started yesterday. The problem has been gradually worsening. The maximum temperature recorded prior to her arrival was 100.4 - 100.9 F. Associated symptoms include congestion, coughing, headaches, joint pain, rhinorrhea, sinus pain, a sore throat and swollen glands. Pertinent negatives include no nausea, plugged ear sensation or wheezing. She has tried decongestant and increased fluids for the symptoms. The treatment provided no relief.   DM: Current use of glimepiride. Does not check glucose at home. Annual eye exam completed per patient. Report requested  HTN: Uncontrolled. Current use of lisinopril/HCTZ and amlodipine. BP Readings from Last 3 Encounters:  10/17/17 (!) 142/96  09/22/17 (!) 146/88  08/05/17 (!) 138/94   Gout: Last gout flare 07/2017. Current use of allopurinol  Outpatient Medications Prior to Visit  Medication Sig Dispense Refill  . allopurinol (ZYLOPRIM) 100 MG tablet Take 1 tablet (100 mg total) by mouth 2 (two) times daily. 180 tablet 1  . amLODipine (NORVASC) 5 MG tablet Take 1 tablet (5 mg total) by mouth at bedtime. 90 tablet 1  . aspirin 81 MG chewable tablet Chew 81 mg by mouth daily.    Marland Kitchen lisinopril-hydrochlorothiazide (ZESTORETIC) 20-12.5 MG tablet Take 2 tablets by mouth daily. 180 tablet 1  . omeprazole (PRILOSEC) 20 MG capsule Take 1 capsule (20 mg total) by mouth daily. 30 capsule 0  . bismuth subsalicylate (PEPTO-BISMOL) 262 MG/15ML suspension Take 30 mLs by mouth every 6 (six) hours as needed. 360 mL 0  . glimepiride (AMARYL) 1 MG tablet Take 1 tablet (1 mg total) by mouth daily with breakfast. 90 tablet 0  . promethazine (PHENERGAN) 12.5 MG tablet Take 1 tablet (12.5 mg total) by mouth every 8 (eight) hours as needed for nausea or vomiting. 20 tablet 0  .  traMADol (ULTRAM) 50 MG tablet Take 1 tablet (50 mg total) by mouth every 12 (twelve) hours as needed. 6 tablet 0  . cholecalciferol (VITAMIN D) 400 UNITS TABS tablet Take 400 Units by mouth daily.     No facility-administered medications prior to visit.     ROS See HPI  Objective:  BP (!) 142/96   Pulse (!) 104   Temp 99 F (37.2 C)   Ht 5\' 3"  (1.6 m)   Wt 248 lb (112.5 kg)   SpO2 96%   BMI 43.93 kg/m   BP Readings from Last 3 Encounters:  10/17/17 (!) 142/96  09/22/17 (!) 146/88  08/05/17 (!) 138/94    Wt Readings from Last 3 Encounters:  10/17/17 248 lb (112.5 kg)  09/22/17 248 lb (112.5 kg)  08/05/17 250 lb (113.4 kg)    Physical Exam  Constitutional: She is oriented to person, place, and time.  HENT:  Right Ear: Tympanic membrane, external ear and ear canal normal.  Left Ear: Tympanic membrane, external ear and ear canal normal.  Nose: Mucosal edema and rhinorrhea present. Right sinus exhibits maxillary sinus tenderness. Right sinus exhibits no frontal sinus tenderness. Left sinus exhibits maxillary sinus tenderness. Left sinus exhibits no frontal sinus tenderness.  Mouth/Throat: Uvula is midline. No trismus in the jaw. Posterior oropharyngeal erythema present. No oropharyngeal exudate.  Eyes: No scleral icterus.  Neck: Normal range of motion. Neck supple.  Cardiovascular: Normal rate and normal heart sounds.  Pulmonary/Chest: Effort normal and breath sounds normal.  Musculoskeletal: She exhibits no edema.  Lymphadenopathy:    She has cervical adenopathy.  Neurological: She is alert and oriented to person, place, and time.  Normal diabetic foot exam  Vitals reviewed.   Lab Results  Component Value Date   WBC 9.4 09/19/2014   HGB 15.0 09/19/2014   HCT 44.2 09/19/2014   PLT 249 09/19/2014   GLUCOSE 73 10/17/2017   CHOL 183 09/02/2016   TRIG 111.0 09/02/2016   HDL 49.20 09/02/2016   LDLDIRECT 110 (H) 09/30/2012   LDLCALC 111 (H) 09/02/2016   ALT 51 (H)  10/17/2017   AST 60 (H) 10/17/2017   NA 136 10/17/2017   K 3.2 (L) 10/17/2017   CL 95 (L) 10/17/2017   CREATININE 0.77 10/17/2017   BUN 17 10/17/2017   CO2 31 10/17/2017   TSH 1.959 03/19/2010   HGBA1C 6.8 (H) 10/17/2017   MICROALBUR 2.5 10/17/2017    Mm Digital Diagnostic Unilat L  Result Date: 02/27/2017 CLINICAL DATA:  62 year old patient recalled from recent screening mammogram for evaluation of calcifications in the left breast. EXAM: DIGITAL DIAGNOSTIC LEFT MAMMOGRAM ULTRASOUND LEFT BREAST COMPARISON:  February 17, 2017 and multiple earlier priors dating back to July 25, 2006 ACR Breast Density Category b: There are scattered areas of fibroglandular density. FINDINGS: Patient was recalled for evaluation of calcifications within two mammographically stable periareolar nodules in the left breast. There are several coarse rounded calcifications within a stable 6 mm circumscribed nodule in the periareolar lower outer quadrant of the left breast. Multiple punctate and rounded calcifications are developing within an 11 mm stable circumscribed oval mass in the periareolar lower outer quadrant of the left breast. Targeted ultrasound is performed, showing 2 adjacent circumscribed hypoechoic nodules in the 4 o'clock position of the left breast 3-4 cm from the nipple containing similar-appearing hyperechoic foci within them, consistent with calcifications. The smaller nodule is 8 x 5 x 7 mm and contains multiple internal calcifications in the larger nodule is 10 x 9 x 4 mm and contains multiple internal calcifications. These account for the mammographically stable nodules with internal calcifications and are consistent with benign degenerating fibroadenomas. IMPRESSION: Calcifications in the left breast are within mammographically stable and sonographically benign appearing masses consistent with fibroadenomas. 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 No evidence of malignancy.  RECOMMENDATION: Screening mammogram in one year.(Code:SM-B-01Y) I have discussed the findings and recommendations with the patient. Results were also provided in writing at the conclusion of the visit. If applicable, a reminder letter will be sent to the patient regarding the next appointment. BI-RADS CATEGORY  2: Benign. Electronically Signed   By: Curlene Dolphin M.D.   On: 02/27/2017 08:32   US Breast Ltd Uni Left Inc Axilla  Result Date: 02/27/2017 CLINICAL DATA:  62 year old patient recalled from recent screening mammogram for evaluation of calcifications in the left breast. EXAM: DIGITAL DIAGNOSTIC LEFT MAMMOGRAM ULTRASOUND LEFT BREAST COMPARISON:  February 17, 2017 and multiple earlier priors dating back to July 25, 2006 ACR Breast Density Category b: There are scattered areas of fibroglandular density. FINDINGS: Patient was recalled for evaluation of calcifications within two mammographically stable periareolar nodules in the left breast. There are several coarse rounded calcifications within a stable 6 mm circumscribed nodule in the periareolar lower outer quadrant of the left breast. Multiple punctate and rounded calcifications are developing within an 11 mm stable circumscribed oval mass in the periareolar lower outer quadrant of the left breast. Targeted ultrasound is performed, showing 2 adjacent circumscribed hypoechoic  nodules in the 4 o'clock position of the left breast 3-4 cm from the nipple containing similar-appearing hyperechoic foci within them, consistent with calcifications. The smaller nodule is 8 x 5 x 7 mm and contains multiple internal calcifications in the larger nodule is 10 x 9 x 4 mm and contains multiple internal calcifications. These account for the mammographically stable nodules with internal calcifications and are consistent with benign degenerating fibroadenomas. IMPRESSION: Calcifications in the left breast are within mammographically stable and sonographically benign appearing  masses consistent with fibroadenomas. 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 No evidence of malignancy. RECOMMENDATION: Screening mammogram in one year.(Code:SM-B-01Y) I have discussed the findings and recommendations with the patient. Results were also provided in writing at the conclusion of the visit. If applicable, a reminder letter will be sent to the patient regarding the next appointment. BI-RADS CATEGORY  2: Benign. Electronically Signed   By: Curlene Dolphin M.D.   On: 02/27/2017 08:32    Assessment & Plan:   Jazzalynn was seen today for cough.  Diagnoses and all orders for this visit:  Flu-like symptoms -     POC Influenza A&B(BINAX/QUICKVUE) -     promethazine-dextromethorphan (PROMETHAZINE-DM) 6.25-15 MG/5ML syrup; Take 5 mLs by mouth 3 (three) times daily as needed for cough. -     fluticasone (FLONASE) 50 MCG/ACT nasal spray; Place 2 sprays into both nostrils daily. -     oxymetazoline (AFRIN NASAL SPRAY) 0.05 % nasal spray; Place 1 spray into both nostrils 2 (two) times daily. Use only for 3days, then stop -     albuterol (PROVENTIL HFA;VENTOLIN HFA) 108 (90 Base) MCG/ACT inhaler; Inhale 1-2 puffs into the lungs every 6 (six) hours as needed. -     oseltamivir (TAMIFLU) 75 MG capsule; Take 1 capsule (75 mg total) by mouth 2 (two) times daily. -     guaiFENesin (MUCINEX) 600 MG 12 hr tablet; Take 1 tablet (600 mg total) by mouth 2 (two) times daily as needed for cough or to loosen phlegm.  Essential hypertension -     Basic metabolic panel -     potassium chloride (K-DUR) 10 MEQ tablet; Take 1 tablet (10 mEq total) by mouth 2 (two) times daily after a meal.  Type 2 diabetes mellitus with complication, without long-term current use of insulin (HCC) -     Hemoglobin A1c -     Microalbumin / creatinine urine ratio -     Hepatic function panel -     metFORMIN (GLUCOPHAGE) 500 MG tablet; Take 1 tablet (500 mg total) by mouth 2 (two) times daily with a  meal.  Gouty arthritis -     Uric acid  Hypokalemia -     potassium chloride (K-DUR) 10 MEQ tablet; Take 1 tablet (10 mEq total) by mouth 2 (two) times daily after a meal.   I have discontinued Shelsie L. Buresh's traMADol, glimepiride, promethazine, and bismuth subsalicylate. I am also having her start on promethazine-dextromethorphan, fluticasone, oxymetazoline, albuterol, oseltamivir, guaiFENesin, metFORMIN, and potassium chloride. Additionally, I am having her maintain her cholecalciferol, aspirin, amLODipine, allopurinol, lisinopril-hydrochlorothiazide, and omeprazole.  Meds ordered this encounter  Medications  . promethazine-dextromethorphan (PROMETHAZINE-DM) 6.25-15 MG/5ML syrup    Sig: Take 5 mLs by mouth 3 (three) times daily as needed for cough.    Dispense:  120 mL    Refill:  0    Order Specific Question:   Supervising Provider    Answer:   Lucille Passy [[id number]]  . fluticasone (FLONASE)  50 MCG/ACT nasal spray    Sig: Place 2 sprays into both nostrils daily.    Dispense:  16 g    Refill:  0    Order Specific Question:   Supervising Provider    Answer:   Lucille Passy [[id number]]  . oxymetazoline (AFRIN NASAL SPRAY) 0.05 % nasal spray    Sig: Place 1 spray into both nostrils 2 (two) times daily. Use only for 3days, then stop    Dispense:  30 mL    Refill:  0    Order Specific Question:   Supervising Provider    Answer:   Lucille Passy [[id number]]  . albuterol (PROVENTIL HFA;VENTOLIN HFA) 108 (90 Base) MCG/ACT inhaler    Sig: Inhale 1-2 puffs into the lungs every 6 (six) hours as needed.    Dispense:  1 Inhaler    Refill:  0    Order Specific Question:   Supervising Provider    Answer:   Lucille Passy [[id number]]  . oseltamivir (TAMIFLU) 75 MG capsule    Sig: Take 1 capsule (75 mg total) by mouth 2 (two) times daily.    Dispense:  10 capsule    Refill:  0    Order Specific Question:   Supervising Provider    Answer:   Lucille Passy [[id number]]  . guaiFENesin (MUCINEX) 600 MG 12 hr  tablet    Sig: Take 1 tablet (600 mg total) by mouth 2 (two) times daily as needed for cough or to loosen phlegm.    Dispense:  14 tablet    Refill:  0    Order Specific Question:   Supervising Provider    Answer:   Lucille Passy [[id number]]  . metFORMIN (GLUCOPHAGE) 500 MG tablet    Sig: Take 1 tablet (500 mg total) by mouth 2 (two) times daily with a meal.    Dispense:  180 tablet    Refill:  1    Order Specific Question:   Supervising Provider    Answer:   Lucille Passy [[id number]]  . potassium chloride (K-DUR) 10 MEQ tablet    Sig: Take 1 tablet (10 mEq total) by mouth 2 (two) times daily after a meal.    Dispense:  180 tablet    Refill:  1    Order Specific Question:   Supervising Provider    Answer:   Lucille Passy [[id number]]    Follow-up: Return in about 3 weeks (around 11/05/2017) for CPE (fasting), breast and PAP exam.  Wilfred Lacy, NP

## 2017-10-18 LAB — HEPATIC FUNCTION PANEL
AG RATIO: 0.9 (calc) — AB (ref 1.0–2.5)
ALKALINE PHOSPHATASE (APISO): 74 U/L (ref 33–130)
ALT: 51 U/L — AB (ref 6–29)
AST: 60 U/L — AB (ref 10–35)
Albumin: 3.5 g/dL — ABNORMAL LOW (ref 3.6–5.1)
BILIRUBIN INDIRECT: 0.3 mg/dL (ref 0.2–1.2)
BILIRUBIN TOTAL: 0.4 mg/dL (ref 0.2–1.2)
Bilirubin, Direct: 0.1 mg/dL (ref 0.0–0.2)
Globulin: 4 g/dL (calc) — ABNORMAL HIGH (ref 1.9–3.7)
Total Protein: 7.5 g/dL (ref 6.1–8.1)

## 2017-10-18 LAB — URIC ACID: URIC ACID, SERUM: 7 mg/dL (ref 2.5–7.0)

## 2017-10-18 LAB — BASIC METABOLIC PANEL
BUN: 17 mg/dL (ref 7–25)
CALCIUM: 8.9 mg/dL (ref 8.6–10.4)
CHLORIDE: 95 mmol/L — AB (ref 98–110)
CO2: 31 mmol/L (ref 20–32)
CREATININE: 0.77 mg/dL (ref 0.50–0.99)
Glucose, Bld: 73 mg/dL (ref 65–99)
POTASSIUM: 3.2 mmol/L — AB (ref 3.5–5.3)
Sodium: 136 mmol/L (ref 135–146)

## 2017-10-18 LAB — MICROALBUMIN / CREATININE URINE RATIO
Creatinine, Urine: 269 mg/dL (ref 20–275)
Microalb Creat Ratio: 9 mcg/mg creat (ref <30)
Microalb, Ur: 2.5 mg/dL

## 2017-10-18 LAB — HEMOGLOBIN A1C
Hgb A1c MFr Bld: 6.8 % of total Hgb — ABNORMAL HIGH (ref <5.7)
Mean Plasma Glucose: 148 (calc)
eAG (mmol/L): 8.2 (calc)

## 2017-10-20 ENCOUNTER — Encounter: Payer: Self-pay | Admitting: Nurse Practitioner

## 2017-10-20 ENCOUNTER — Ambulatory Visit: Payer: BC Managed Care – PPO | Admitting: Nurse Practitioner

## 2017-10-20 ENCOUNTER — Ambulatory Visit: Payer: Self-pay | Admitting: *Deleted

## 2017-10-20 DIAGNOSIS — M109 Gout, unspecified: Secondary | ICD-10-CM | POA: Insufficient documentation

## 2017-10-20 DIAGNOSIS — E876 Hypokalemia: Secondary | ICD-10-CM | POA: Insufficient documentation

## 2017-10-20 MED ORDER — POTASSIUM CHLORIDE ER 10 MEQ PO TBCR: 10.0000 meq | EXTENDED_RELEASE_TABLET | Freq: Two times a day (BID) | ORAL | 1 refills | Status: DC

## 2017-10-20 MED ORDER — METFORMIN HCL 500 MG PO TABS: 500.0000 mg | ORAL_TABLET | Freq: Two times a day (BID) | ORAL | 1 refills | Status: DC

## 2017-10-20 NOTE — Assessment & Plan Note (Signed)
Uncontrolled. D/c glimepiride. Start metformin 500mg  BID

## 2017-10-21 ENCOUNTER — Telehealth: Payer: Self-pay | Admitting: Nurse Practitioner

## 2017-10-21 DIAGNOSIS — B182 Chronic viral hepatitis C: Secondary | ICD-10-CM

## 2017-10-21 NOTE — Telephone Encounter (Signed)
Copied from [place] [id number]. Topic: Quick Communication - See Telephone Encounter >> Oct 21, 2017  5:20 PM Cleaster Corin, NT wrote: CRM for notification. See Telephone encounter for:   10/21/17. Pt. Calling and stated she needs referral sent to Dr. Per pt. Dr. Lorayne Marek knows which provider it needs to be sent to

## 2017-10-22 ENCOUNTER — Ambulatory Visit: Payer: Self-pay

## 2017-10-22 NOTE — Telephone Encounter (Signed)
Pt is aware.  

## 2017-10-22 NOTE — Telephone Encounter (Signed)
ok 

## 2017-10-22 NOTE — Telephone Encounter (Signed)
Pt stated Dr. Linus Salmons office stated she would need a referral to see her since it has been 1 year or more since her last ov. Ok to put in referral?

## 2017-10-22 NOTE — Telephone Encounter (Signed)
Pt stated last night was her first time taking metformin and K+, 3 hrs later she had diarrhea unexpected. Pt was wondering if it has anything to do with medications and what to do? Please advise.

## 2017-10-22 NOTE — Telephone Encounter (Signed)
Pt. Reports she started Metformin and potassium yesterday evening and woke up early this morning with diarrhea. Feels like this is from her new medication.Has had 3 episodes since 0130 this morning. Please advise pt. Reason for Disposition . MILD-MODERATE diarrhea (e.g., 1-6 times / day more than normal)  Answer Assessment - Initial Assessment Questions 1. DIARRHEA SEVERITY: "How bad is the diarrhea?" "How many extra stools have you had in the past 24 hours than normal?"    - MILD: Few loose or mushy BMs; increase of 1-3 stools over normal daily number of stools; mild increase in ostomy output.   - MODERATE: Increase of 4-6 stools daily over normal; moderate increase in ostomy output.   - SEVERE (or Worst Possible): Increase of 7 or more stools daily over normal; moderate increase in ostomy output; incontinence.     3 episodes 2. ONSET: "When did the diarrhea begin?"      During the night 3. BM CONSISTENCY: "How loose or watery is the diarrhea?"      Loose 4. VOMITING: "Are you also vomiting?" If so, ask: "How many times in the past 24 hours?"      No 5. ABDOMINAL PAIN: "Are you having any abdominal pain?" If yes: "What does it feel like?" (e.g., crampy, dull, intermittent, constant)      No 6. ABDOMINAL PAIN SEVERITY: If present, ask: "How bad is the pain?"  (e.g., Scale 1-10; mild, moderate, or severe)    - MILD (1-3): doesn't interfere with normal activities, abdomen soft and not tender to touch     - MODERATE (4-7): interferes with normal activities or awakens from sleep, tender to touch     - SEVERE (8-10): excruciating pain, doubled over, unable to do any normal activities       No pain 7. ORAL INTAKE: If vomiting, "Have you been able to drink liquids?" "How much fluids have you had in the past 24 hours?"     Yes 8. HYDRATION: "Any signs of dehydration?" (e.g., dry mouth [not just dry lips], too weak to stand, dizziness, new weight loss) "When did you last urinate?"     N/A 9.  EXPOSURE: "Have you traveled to a foreign country recently?" "Have you been exposed to anyone with diarrhea?" "Could you have eaten any food that was spoiled?"     No 10. OTHER SYMPTOMS: "Do you have any other symptoms?" (e.g., fever, blood in stool)       No 11. PREGNANCY: "Is there any chance you are pregnant?" "When was your last menstrual period?"       No  Protocols used: DIARRHEA-A-AH

## 2017-10-22 NOTE — Telephone Encounter (Signed)
Referral entered  

## 2017-10-22 NOTE — Telephone Encounter (Signed)
Hold metfomin for 2days, then resume to 1tab once a day. Push oral hydration with water and pedialyte and/or gartorade G2. May also use imodium OTC for diarrhea. Return to office if no improvement in 1week.

## 2017-11-05 ENCOUNTER — Encounter: Payer: Self-pay | Admitting: Nurse Practitioner

## 2017-11-05 ENCOUNTER — Other Ambulatory Visit (HOSPITAL_COMMUNITY)
Admission: RE | Admit: 2017-11-05 | Discharge: 2017-11-05 | Disposition: A | Discharge status: Home / Self Care | Payer: BC Managed Care – PPO | Source: Ambulatory Visit | Attending: Nurse Practitioner | Admitting: Nurse Practitioner

## 2017-11-05 ENCOUNTER — Ambulatory Visit: Payer: BC Managed Care – PPO | Admitting: Nurse Practitioner

## 2017-11-05 VITALS — BP 138/78 | HR 101 | Temp 98.4°F | Wt 250.8 lb

## 2017-11-05 DIAGNOSIS — Z0000 Encounter for general adult medical examination without abnormal findings: Secondary | ICD-10-CM

## 2017-11-05 DIAGNOSIS — Z124 Encounter for screening for malignant neoplasm of cervix: Secondary | ICD-10-CM

## 2017-11-05 DIAGNOSIS — Z0001 Encounter for general adult medical examination with abnormal findings: Secondary | ICD-10-CM | POA: Diagnosis not present

## 2017-11-05 DIAGNOSIS — Z01419 Encounter for gynecological examination (general) (routine) without abnormal findings: Secondary | ICD-10-CM | POA: Insufficient documentation

## 2017-11-05 DIAGNOSIS — Z23 Encounter for immunization: Secondary | ICD-10-CM

## 2017-11-05 DIAGNOSIS — E118 Type 2 diabetes mellitus with unspecified complications: Secondary | ICD-10-CM | POA: Diagnosis not present

## 2017-11-05 DIAGNOSIS — A5901 Trichomonal vulvovaginitis: Secondary | ICD-10-CM | POA: Diagnosis not present

## 2017-11-05 MED ORDER — BLOOD GLUCOSE MONITOR KIT: PACK | 3 refills | Status: DC

## 2017-11-05 NOTE — Progress Notes (Addendum)
Subjective:    Patient ID: Melanie Alvarez, female    DOB: June 02, 1956, 62 y.o.   MRN: [id number]  Patient presents today for complete physical  HPI  Immunizations: (TDAP, Hep C screen, Pneumovax, Influenza, zoster)  Health Maintenance  Topic Date Due  . Pap Smear  03/15/1978  . Eye exam for diabetics  11/16/2013  . HIV Screening  11/06/2018*  . Hemoglobin A1C  04/19/2018  . Complete foot exam   10/18/2018  . Mammogram  02/28/2019  . Tetanus Vaccine  04/20/2019  . Colon Cancer Screening  07/23/2021  . Pneumococcal vaccine (2) 11/06/2022  . Flu Shot  Completed  .  Hepatitis C: One time screening is recommended by Center for Disease Control  (CDC) for  adults born from [birth year] through 1964.   Completed  *Topic was postponed. The date shown is not the original due date.   Diet:regular.  Weight:  Wt Readings from Last 3 Encounters:  11/05/17 250 lb 12.8 oz (113.8 kg)  10/17/17 248 lb (112.5 kg)  09/22/17 248 lb (112.5 kg)   Exercise:none   Fall Risk: Fall Risk  10/17/2017 09/25/2016 07/22/2016  Falls in the past year? No No No   Depression/Suicide: Depression screen [facility] 2/9 10/17/2017 09/25/2016 07/22/2016  Decreased Interest 0 0 0  Down, Depressed, Hopeless 0 0 0  PHQ - 2 Score 0 0 0   Pap Smear (every 36yr for >21-29 without HPV, every 561yrfor >30-6537yrith HPV):needed.  Vision:needed, will schedule.  Dental:needed, will schedule.  Advanced Directive: Advanced Directives 09/19/2014  Does Patient Have a Medical Advance Directive? No  Would patient like information on creating a medical advance directive? No - patient declined information  Pre-existing out of facility DNR order (yellow form or pink MOST form) -    Medications and allergies reviewed with patient and updated if appropriate.  Patient Active Problem List   Diagnosis Date Noted  . Gouty arthritis 10/20/2017  . Hypokalemia 10/20/2017  . Hypertension 08/13/2012  . DM (diabetes mellitus) ([facility]2/26/2013  .  Tobacco abuse 08/13/2012  . Chronic hepatitis C without hepatic coma ([facility]CC[facility]2/26/2013  . Obesity, morbid, BMI 40.0-49.9 ([facility]CC[facility]2/26/2013    Current Outpatient Medications on File Prior to Visit  Medication Sig Dispense Refill  . albuterol (PROVENTIL HFA;VENTOLIN HFA) 108 (90 Base) MCG/ACT inhaler Inhale 1-2 puffs into the lungs every 6 (six) hours as needed. 1 Inhaler 0  . allopurinol (ZYLOPRIM) 100 MG tablet Take 1 tablet (100 mg total) by mouth 2 (two) times daily. 180 tablet 1  . amLODipine (NORVASC) 5 MG tablet Take 1 tablet (5 mg total) by mouth at bedtime. 90 tablet 1  . aspirin 81 MG chewable tablet Chew 81 mg by mouth daily.    . cholecalciferol (VITAMIN D) 400 UNITS TABS tablet Take 400 Units by mouth daily.    . fluticasone (FLONASE) 50 MCG/ACT nasal spray Place 2 sprays into both nostrils daily. 16 g 0  . lisinopril-hydrochlorothiazide (ZESTORETIC) 20-12.5 MG tablet Take 2 tablets by mouth daily. 180 tablet 1  . metFORMIN (GLUCOPHAGE) 500 MG tablet Take 1 tablet (500 mg total) by mouth 2 (two) times daily with a meal. 180 tablet 1  . omeprazole (PRILOSEC) 20 MG capsule Take 1 capsule (20 mg total) by mouth daily. 30 capsule 0  . potassium chloride (K-DUR) 10 MEQ tablet Take 1 tablet (10 mEq total) by mouth 2 (two) times daily after a meal. 180 tablet 1  . KLOR-CON M10 10 MEQ tablet  No current facility-administered medications on file prior to visit.     Past Medical History:  Diagnosis Date  . Diabetes mellitus 2012  . Hepatitis 2010   history of Hepatitis C  . Hypertension 2011  . Obesity     Past Surgical History:  Procedure Laterality Date  . COLONOSCOPY  07/24/2011   Procedure: COLONOSCOPY;  Surgeon: Landry Dyke, MD;  Location: [facility];  Service: Endoscopy;  Laterality: N/A;  . Dental Surgery Tooth Extraction      Social History   Socioeconomic History  . Marital status: Married    Spouse name: Not on file  . Number of children: Not on file  .  Years of education: Not on file  . Highest education level: Not on file  Occupational History  . Not on file  Social Needs  . Financial resource strain: Not on file  . Food insecurity:    Worry: Not on file    Inability: Not on file  . Transportation needs:    Medical: Not on file    Non-medical: Not on file  Tobacco Use  . Smoking status: Current Some Day Smoker    Packs/day: 0.50    Types: Cigarettes  . Smokeless tobacco: Never Used  Substance and Sexual Activity  . Alcohol use: Yes    Alcohol/week: 18.0 oz    Types: 24 Cans of beer, 6 Shots of liquor per week    Comment: weekends  . Drug use: No    Comment: previously   . Sexual activity: Yes    Partners: Male  Lifestyle  . Physical activity:    Days per week: Not on file    Minutes per session: Not on file  . Stress: Not on file  Relationships  . Social connections:    Talks on phone: Not on file    Gets together: Not on file    Attends religious service: Not on file    Active member of club or organization: Not on file    Attends meetings of clubs or organizations: Not on file    Relationship status: Not on file  Other Topics Concern  . Not on file  Social History Narrative   Previous [facility] pt.  Last MD was Dr. Delman Cheadle, seen 12/2011      Works part time in [employer] with husband    Family History  Problem Relation Age of Onset  . Diabetes Sister   . Cancer Maternal Grandmother        leukemia   . Diabetes Maternal Grandfather   . Diabetes Paternal Grandfather   . Diabetes Paternal Grandmother   . Hypertension Sister   . Hypertension Brother   . Anesthesia problems Neg Hx         Review of Systems  Constitutional: Negative for fever, malaise/fatigue and weight loss.  HENT: Negative for congestion and sore throat.   Eyes:       Negative for visual changes  Respiratory: Negative for cough and shortness of breath.   Cardiovascular: Negative for chest pain,  palpitations and leg swelling.  Gastrointestinal: Negative for blood in stool, constipation, diarrhea and heartburn.  Genitourinary: Negative for dysuria, frequency and urgency.  Musculoskeletal: Negative for falls, joint pain and myalgias.  Skin: Negative for rash.  Neurological: Negative for dizziness, sensory change and headaches.  Endo/Heme/Allergies: Does not bruise/bleed easily.  Psychiatric/Behavioral: Negative for depression, substance abuse and suicidal ideas. The patient is not  nervous/anxious.     Objective:   Vitals:   11/05/17 0913  BP: 138/78  Pulse: (!) 101  Temp: 98.4 F (36.9 C)  SpO2: 96%    Body mass index is 44.43 kg/m.   Physical Examination:  Physical Exam  Constitutional: She is oriented to person, place, and time and well-developed, well-nourished, and in no distress. No distress.  HENT:  Right Ear: External ear normal.  Left Ear: External ear normal.  Nose: Nose normal.  Mouth/Throat: No oropharyngeal exudate.  Eyes: Conjunctivae and EOM are normal. Pupils are equal, round, and reactive to light. No scleral icterus.  Neck: Normal range of motion. Neck supple. No thyromegaly present.  Cardiovascular: Normal rate, regular rhythm, normal heart sounds and intact distal pulses.  Pulmonary/Chest: Effort normal and breath sounds normal. Right breast exhibits no mass, no nipple discharge and no skin change. Left breast exhibits no mass, no nipple discharge and no skin change. Breasts are symmetrical.  Abdominal: Soft. Bowel sounds are normal. She exhibits no distension. There is no tenderness.  Genitourinary: Rectum normal, vagina normal, cervix normal, right adnexa normal, left adnexa normal and vulva normal. Cervix exhibits no motion tenderness. No vaginal discharge found.  Musculoskeletal: Normal range of motion. She exhibits no edema or tenderness.  Lymphadenopathy:    She has no cervical adenopathy.  Neurological: She is alert and oriented to person,  place, and time. Gait normal.  Skin: Skin is warm and dry.  Psychiatric: Affect and judgment normal.  Vitals reviewed.   ASSESSMENT and PLAN:  Shamere was seen today for annual exam.  Diagnoses and all orders for this visit:  Preventative health care -     Cytology - PAP  Type 2 diabetes mellitus with complication, without long-term current use of insulin ([place]) -     blood glucose meter kit and supplies KIT; Dispense based on patient and insurance preference. Use once a day (before breakfast). E11.9  Encounter for Papanicolaou smear for cervical cancer screening -     Cytology - PAP  Need for pneumococcal vaccination -     Pneumococcal polysaccharide vaccine 23-valent greater than or equal to 2yo subcutaneous/IM  Trichomoniasis of vagina -     metroNIDAZOLE (FLAGYL) 500 MG tablet; Take 2 tablets (1,000 mg total) by mouth 2 (two) times daily. -     Urine cytology ancillary only; Future   No problem-specific Assessment & Plan notes found for this encounter.     Follow up: Return in about 6 months (around 05/08/2018) for DM and HTN, hyperlipidemia (fasting).  Wilfred Lacy, NP

## 2017-11-05 NOTE — Patient Instructions (Signed)
Bring glucose reading to next office visit.  Let me know which ophthalmology is acceptable under your insurance.  Make appt for repeat mammogram (02/2018)  Make appt for routine dental cleaning.  Start healthy diet and regular exercise.   DASH Eating Plan DASH stands for "Dietary Approaches to Stop Hypertension." The DASH eating plan is a healthy eating plan that has been shown to reduce high blood pressure (hypertension). It may also reduce your risk for type 2 diabetes, heart disease, and stroke. The DASH eating plan may also help with weight loss. What are tips for following this plan? General guidelines  Avoid eating more than 2,300 mg (milligrams) of salt (sodium) a day. If you have hypertension, you may need to reduce your sodium intake to 1,500 mg a day.  Limit alcohol intake to no more than 1 drink a day for nonpregnant women and 2 drinks a day for men. One drink equals 12 oz of beer, 5 oz of wine, or 1 oz of hard liquor.  Work with your health care provider to maintain a healthy body weight or to lose weight. Ask what an ideal weight is for you.  Get at least 30 minutes of exercise that causes your heart to beat faster (aerobic exercise) most days of the week. Activities may include walking, swimming, or biking.  Work with your health care provider or diet and nutrition specialist (dietitian) to adjust your eating plan to your individual calorie needs. Reading food labels  Check food labels for the amount of sodium per serving. Choose foods with less than 5 percent of the Daily Value of sodium. Generally, foods with less than 300 mg of sodium per serving fit into this eating plan.  To find whole grains, look for the word "whole" as the first word in the ingredient list. Shopping  Buy products labeled as "low-sodium" or "no salt added."  Buy fresh foods. Avoid canned foods and premade or frozen meals. Cooking  Avoid adding salt when cooking. Use salt-free seasonings or  herbs instead of table salt or sea salt. Check with your health care provider or pharmacist before using salt substitutes.  Do not fry foods. Cook foods using healthy methods such as baking, boiling, grilling, and broiling instead.  Cook with heart-healthy oils, such as olive, canola, soybean, or sunflower oil. Meal planning   Eat a balanced diet that includes: ? 5 or more servings of fruits and vegetables each day. At each meal, try to fill half of your plate with fruits and vegetables. ? Up to 6-8 servings of whole grains each day. ? Less than 6 oz of lean meat, poultry, or fish each day. A 3-oz serving of meat is about the same size as a deck of cards. One egg equals 1 oz. ? 2 servings of low-fat dairy each day. ? A serving of nuts, seeds, or beans 5 times each week. ? Heart-healthy fats. Healthy fats called Omega-3 fatty acids are found in foods such as flaxseeds and coldwater fish, like sardines, salmon, and mackerel.  Limit how much you eat of the following: ? Canned or prepackaged foods. ? Food that is high in trans fat, such as fried foods. ? Food that is high in saturated fat, such as fatty meat. ? Sweets, desserts, sugary drinks, and other foods with added sugar. ? Full-fat dairy products.  Do not salt foods before eating.  Try to eat at least 2 vegetarian meals each week.  Eat more home-cooked food and less restaurant, buffet, and  fast food.  When eating at a restaurant, ask that your food be prepared with less salt or no salt, if possible. What foods are recommended? The items listed may not be a complete list. Talk with your dietitian about what dietary choices are best for you. Grains Whole-grain or whole-wheat bread. Whole-grain or whole-wheat pasta. Brown rice. [place] Morrow. Bulgur. Whole-grain and low-sodium cereals. Pita bread. Low-fat, low-sodium crackers. Whole-wheat flour tortillas. Vegetables Fresh or frozen vegetables (raw, steamed, roasted, or grilled).  Low-sodium or reduced-sodium tomato and vegetable juice. Low-sodium or reduced-sodium tomato sauce and tomato paste. Low-sodium or reduced-sodium canned vegetables. Fruits All fresh, dried, or frozen fruit. Canned fruit in natural juice (without added sugar). Meat and other protein foods Skinless chicken or [place]. Ground chicken or [place]. Pork with fat trimmed off. Fish and seafood. Egg whites. Dried beans, peas, or lentils. Unsalted nuts, nut butters, and seeds. Unsalted canned beans. Lean cuts of beef with fat trimmed off. Low-sodium, lean deli meat. Dairy Low-fat (1%) or fat-free (skim) milk. Fat-free, low-fat, or reduced-fat cheeses. Nonfat, low-sodium ricotta or cottage cheese. Low-fat or nonfat yogurt. Low-fat, low-sodium cheese. Fats and oils Soft margarine without trans fats. Vegetable oil. Low-fat, reduced-fat, or light mayonnaise and salad dressings (reduced-sodium). Canola, safflower, olive, soybean, and sunflower oils. Avocado. Seasoning and other foods Herbs. Spices. Seasoning mixes without salt. Unsalted popcorn and pretzels. Fat-free sweets. What foods are not recommended? The items listed may not be a complete list. Talk with your dietitian about what dietary choices are best for you. Grains Baked goods made with fat, such as croissants, muffins, or some breads. Dry pasta or rice meal packs. Vegetables Creamed or fried vegetables. Vegetables in a cheese sauce. Regular canned vegetables (not low-sodium or reduced-sodium). Regular canned tomato sauce and paste (not low-sodium or reduced-sodium). Regular tomato and vegetable juice (not low-sodium or reduced-sodium). Angie Fava. Olives. Fruits Canned fruit in a light or heavy syrup. Fried fruit. Fruit in cream or butter sauce. Meat and other protein foods Fatty cuts of meat. Ribs. Fried meat. Berniece Salines. Sausage. Bologna and other processed lunch meats. Salami. Fatback. Hotdogs. Bratwurst. Salted nuts and seeds. Canned beans with added  salt. Canned or smoked fish. Whole eggs or egg yolks. Chicken or [place] with skin. Dairy Whole or 2% milk, cream, and half-and-half. Whole or full-fat cream cheese. Whole-fat or sweetened yogurt. Full-fat cheese. Nondairy creamers. Whipped toppings. Processed cheese and cheese spreads. Fats and oils Butter. Stick margarine. Lard. Shortening. Ghee. Bacon fat. Tropical oils, such as coconut, palm kernel, or palm oil. Seasoning and other foods Salted popcorn and pretzels. Onion salt, garlic salt, seasoned salt, table salt, and sea salt. Worcestershire sauce. Tartar sauce. Barbecue sauce. Teriyaki sauce. Soy sauce, including reduced-sodium. Steak sauce. Canned and packaged gravies. Fish sauce. Oyster sauce. Cocktail sauce. Horseradish that you find on the shelf. Ketchup. Mustard. Meat flavorings and tenderizers. Bouillon cubes. Hot sauce and Tabasco sauce. Premade or packaged marinades. Premade or packaged taco seasonings. Relishes. Regular salad dressings. Where to find more information:  National Heart, Lung, and [place]: https://wilson-eaton.com/  American Heart Association: www.heart.org Summary  The DASH eating plan is a healthy eating plan that has been shown to reduce high blood pressure (hypertension). It may also reduce your risk for type 2 diabetes, heart disease, and stroke.  With the DASH eating plan, you should limit salt (sodium) intake to 2,300 mg a day. If you have hypertension, you may need to reduce your sodium intake to 1,500 mg a day.  When on the  DASH eating plan, aim to eat more fresh fruits and vegetables, whole grains, lean proteins, low-fat dairy, and heart-healthy fats.  Work with your health care provider or diet and nutrition specialist (dietitian) to adjust your eating plan to your individual calorie needs. This information is not intended to replace advice given to you by your health care provider. Make sure you discuss any questions you have with your health care  provider. Document Released: 07/25/2011 Document Revised: 07/29/2016 Document Reviewed: 07/29/2016 Elsevier Interactive Patient Education  [employer].

## 2017-11-07 LAB — CYTOLOGY - PAP
Bacterial vaginitis: NEGATIVE
Candida vaginitis: NEGATIVE
DIAGNOSIS: NEGATIVE
HPV: NOT DETECTED

## 2017-11-07 MED ORDER — METRONIDAZOLE 500 MG PO TABS: 1000.0000 mg | ORAL_TABLET | Freq: Two times a day (BID) | ORAL | 0 refills | Status: DC

## 2017-11-07 NOTE — Addendum Note (Signed)
Addended by: Wilfred Lacy L on: 11/07/2017 12:01 PM   Modules accepted: Orders

## 2017-11-10 ENCOUNTER — Other Ambulatory Visit: Payer: Self-pay | Admitting: Nurse Practitioner

## 2017-11-10 DIAGNOSIS — R87619 Unspecified abnormal cytological findings in specimens from cervix uteri: Secondary | ICD-10-CM

## 2017-11-11 ENCOUNTER — Other Ambulatory Visit: Payer: BC Managed Care – PPO

## 2017-11-11 ENCOUNTER — Other Ambulatory Visit (HOSPITAL_COMMUNITY)
Admission: RE | Admit: 2017-11-11 | Discharge: 2017-11-11 | Disposition: A | Discharge status: Home / Self Care | Payer: BC Managed Care – PPO | Source: Ambulatory Visit | Attending: Nurse Practitioner | Admitting: Nurse Practitioner

## 2017-11-11 DIAGNOSIS — R87619 Unspecified abnormal cytological findings in specimens from cervix uteri: Secondary | ICD-10-CM | POA: Diagnosis not present

## 2017-11-11 DIAGNOSIS — A5901 Trichomonal vulvovaginitis: Secondary | ICD-10-CM | POA: Diagnosis not present

## 2017-11-12 ENCOUNTER — Other Ambulatory Visit: Payer: BC Managed Care – PPO

## 2017-11-12 LAB — URINE CYTOLOGY ANCILLARY ONLY
Chlamydia: NEGATIVE
Neisseria Gonorrhea: NEGATIVE
TRICH (WINDOWPATH): POSITIVE — AB

## 2017-11-13 ENCOUNTER — Other Ambulatory Visit: Payer: Self-pay | Admitting: Nurse Practitioner

## 2017-11-13 DIAGNOSIS — A5901 Trichomonal vulvovaginitis: Secondary | ICD-10-CM

## 2017-11-25 ENCOUNTER — Other Ambulatory Visit (HOSPITAL_COMMUNITY)
Admission: RE | Admit: 2017-11-25 | Discharge: 2017-11-25 | Disposition: A | Discharge status: Home / Self Care | Payer: BC Managed Care – PPO | Source: Ambulatory Visit | Attending: Nurse Practitioner | Admitting: Nurse Practitioner

## 2017-11-25 ENCOUNTER — Other Ambulatory Visit: Payer: BC Managed Care – PPO

## 2017-11-25 ENCOUNTER — Encounter: Payer: Self-pay | Admitting: Internal Medicine

## 2017-11-25 ENCOUNTER — Telehealth: Payer: Self-pay | Admitting: *Deleted

## 2017-11-25 ENCOUNTER — Ambulatory Visit (INDEPENDENT_AMBULATORY_CARE_PROVIDER_SITE_OTHER): Payer: BC Managed Care – PPO | Admitting: Internal Medicine

## 2017-11-25 VITALS — BP 138/90 | HR 102 | Temp 97.8°F | Wt 246.0 lb

## 2017-11-25 DIAGNOSIS — Z720 Tobacco use: Secondary | ICD-10-CM

## 2017-11-25 DIAGNOSIS — A5901 Trichomonal vulvovaginitis: Secondary | ICD-10-CM | POA: Insufficient documentation

## 2017-11-25 DIAGNOSIS — B182 Chronic viral hepatitis C: Secondary | ICD-10-CM

## 2017-11-25 NOTE — Assessment & Plan Note (Signed)
Not interested in quitting at this time

## 2017-11-25 NOTE — Assessment & Plan Note (Signed)
Will get her labs and elastography and have her come back to go over the results.

## 2017-11-25 NOTE — Progress Notes (Signed)
   Subjective:    Patient ID: Melanie Alvarez, female    DOB: 09-08-1955, 62 y.o.   MRN: [id number]  HPI Here for follow up of chronic hepatitis C.  I saw her last year and she wanted to wait until the summer to get treatment due to her work schedule but did not return.  Here now interested in treatment.  Unknown genotype.  No associated fatigue.  No new issues.     Review of Systems  Constitutional: Negative for fatigue.  Gastrointestinal: Negative for diarrhea.  Skin: Negative for rash.       Objective:   Physical Exam  Constitutional: She appears well-developed and well-nourished. No distress.  HENT:  Mouth/Throat: No oropharyngeal exudate.  Eyes: No scleral icterus.  Cardiovascular: Normal rate, regular rhythm and normal heart sounds.  No murmur heard. Pulmonary/Chest: Effort normal and breath sounds normal. No respiratory distress.  Skin: No rash noted.   SH: + occasional alcohol, + tobacco       Assessment & Plan:

## 2017-11-25 NOTE — Telephone Encounter (Signed)
Patient scheduled for elastography 4/23 8:00 (7:45 arrival) at [facility], nothing to eat or drink after midnight. She will follow up 5/8 3:45 with Dr Linus Salmons. Appointments made while patient was in office. Landis Gandy, RN

## 2017-11-26 LAB — CBC WITH DIFFERENTIAL/PLATELET
BASOS ABS: 77 {cells}/uL (ref 0–200)
Basophils Relative: 0.9 %
EOS ABS: 120 {cells}/uL (ref 15–500)
EOS PCT: 1.4 %
HCT: 42.7 % (ref 35.0–45.0)
HEMOGLOBIN: 14.6 g/dL (ref 11.7–15.5)
LYMPHS ABS: 2649 {cells}/uL (ref 850–3900)
MCH: 29.9 pg (ref 27.0–33.0)
MCHC: 34.2 g/dL (ref 32.0–36.0)
MCV: 87.5 fL (ref 80.0–100.0)
MPV: 10 fL (ref 7.5–12.5)
Monocytes Relative: 4.9 %
NEUTROS ABS: 5332 {cells}/uL (ref 1500–7800)
NEUTROS PCT: 62 %
Platelets: 323 10*3/uL (ref 140–400)
RBC: 4.88 10*6/uL (ref 3.80–5.10)
RDW: 12 % (ref 11.0–15.0)
Total Lymphocyte: 30.8 %
WBC: 8.6 10*3/uL (ref 3.8–10.8)
WBCMIX: 421 {cells}/uL (ref 200–950)

## 2017-11-26 LAB — COMPLETE METABOLIC PANEL WITH GFR
AG Ratio: 1 (calc) (ref 1.0–2.5)
ALBUMIN MSPROF: 3.6 g/dL (ref 3.6–5.1)
ALT: 38 U/L — AB (ref 6–29)
AST: 39 U/L — ABNORMAL HIGH (ref 10–35)
Alkaline phosphatase (APISO): 54 U/L (ref 33–130)
BUN: 14 mg/dL (ref 7–25)
CHLORIDE: 96 mmol/L — AB (ref 98–110)
CO2: 33 mmol/L — AB (ref 20–32)
CREATININE: 0.67 mg/dL (ref 0.50–0.99)
Calcium: 9.4 mg/dL (ref 8.6–10.4)
GFR, EST AFRICAN AMERICAN: 111 mL/min/{1.73_m2} (ref >=60)
GFR, EST NON AFRICAN AMERICAN: 96 mL/min/{1.73_m2} (ref >=60)
GLUCOSE: 162 mg/dL — AB (ref 65–99)
Globulin: 3.5 g/dL (calc) (ref 1.9–3.7)
Potassium: 3.8 mmol/L (ref 3.5–5.3)
Sodium: 137 mmol/L (ref 135–146)
TOTAL PROTEIN: 7.1 g/dL (ref 6.1–8.1)
Total Bilirubin: 0.4 mg/dL (ref 0.2–1.2)

## 2017-11-26 LAB — HEPATITIS A ANTIBODY, TOTAL: Hepatitis A AB,Total: NONREACTIVE

## 2017-11-26 LAB — HIV ANTIBODY (ROUTINE TESTING W REFLEX): HIV 1&2 Ab, 4th Generation: NONREACTIVE

## 2017-11-26 LAB — HEPATITIS B SURFACE ANTIBODY,QUALITATIVE: HEP B S AB: NONREACTIVE

## 2017-11-26 LAB — HEPATITIS B CORE ANTIBODY, TOTAL: HEP B C TOTAL AB: NONREACTIVE

## 2017-11-26 LAB — PROTIME-INR
INR: 1
PROTHROMBIN TIME: 10.5 s (ref 9.0–11.5)

## 2017-11-26 LAB — HEPATITIS B SURFACE ANTIGEN: HEP B S AG: NONREACTIVE

## 2017-11-27 LAB — URINE CYTOLOGY ANCILLARY ONLY: TRICH (WINDOWPATH): NEGATIVE

## 2017-11-28 LAB — LIVER FIBROSIS, FIBROTEST-ACTITEST
ALT: 40 U/L — AB (ref 6–29)
APOLIPOPROTEIN A1: 152 mg/dL (ref 101–198)
Alpha-2-Macroglobulin: 222 mg/dL (ref 106–279)
Bilirubin: 0.3 mg/dL (ref 0.2–1.2)
Fibrosis Score: 0.21
GGT: 69 U/L — AB (ref 3–65)
Haptoglobin: 172 mg/dL (ref 43–212)
Necroinflammat ACT Score: 0.2
REFERENCE ID: 2418872

## 2017-11-28 LAB — HEPATITIS C RNA QUANTITATIVE
HCV QUANT LOG: 6.4 {Log_IU}/mL — AB
HCV RNA, PCR, QN: 2510000 IU/mL — ABNORMAL HIGH

## 2017-11-28 LAB — HEPATITIS C GENOTYPE

## 2017-12-09 ENCOUNTER — Ambulatory Visit (HOSPITAL_COMMUNITY)
Admission: RE | Admit: 2017-12-09 | Discharge: 2017-12-09 | Disposition: A | Discharge status: Home / Self Care | Payer: BC Managed Care – PPO | Source: Ambulatory Visit | Attending: Internal Medicine | Admitting: Internal Medicine

## 2017-12-09 DIAGNOSIS — R932 Abnormal findings on diagnostic imaging of liver and biliary tract: Secondary | ICD-10-CM | POA: Insufficient documentation

## 2017-12-09 DIAGNOSIS — B182 Chronic viral hepatitis C: Secondary | ICD-10-CM | POA: Diagnosis present

## 2017-12-24 ENCOUNTER — Encounter: Payer: Self-pay | Admitting: Internal Medicine

## 2017-12-24 ENCOUNTER — Ambulatory Visit: Payer: BC Managed Care – PPO | Admitting: Internal Medicine

## 2017-12-24 VITALS — BP 122/81 | HR 103 | Temp 98.1°F | Ht 63.0 in | Wt 244.4 lb

## 2017-12-24 DIAGNOSIS — B182 Chronic viral hepatitis C: Secondary | ICD-10-CM | POA: Diagnosis not present

## 2017-12-24 DIAGNOSIS — K7400 Hepatic fibrosis, unspecified: Secondary | ICD-10-CM

## 2017-12-24 DIAGNOSIS — Z7189 Other specified counseling: Secondary | ICD-10-CM

## 2017-12-24 DIAGNOSIS — Z23 Encounter for immunization: Secondary | ICD-10-CM | POA: Diagnosis not present

## 2017-12-24 DIAGNOSIS — Z7185 Encounter for immunization safety counseling: Secondary | ICD-10-CM

## 2017-12-24 MED ORDER — LEDIPASVIR-SOFOSBUVIR 90-400 MG PO TABS: 1.0000 | ORAL_TABLET | Freq: Every day | ORAL | 2 refills | Status: DC

## 2017-12-24 NOTE — Patient Instructions (Addendum)
Date 12/24/17  Dear Ms Freeman Caldron, As discussed in the [facility], your hepatitis C therapy will include the following medications:          Harvoni 90mg /400mg  tablet:           Take 1 tablet by mouth once daily   Please note that ALL MEDICATIONS WILL START ON THE SAME DATE for a total of 12 weeks.  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  Remember, each prescription is for 28 days. ---------------------------------------------------------------- GENERAL NOTES REGARDING YOUR HEPATITIS C MEDICATION:  SOFOSBUVIR/LEDIPASVIR (HARVONI): - Harvoni tablet is taken daily with OR without food. - The tablets are orange. - The tablets should be stored at room temperature.  - Acid reducing agents such as H2 blockers (ie. Pepcid (famotidine), Zantac (ranitidine), Tagamet (cimetidine), Axid (nizatidine) and proton pump inhibitors (ie. Prilosec (omeprazole), Protonix (pantoprazole), Nexium (esomeprazole), or Aciphex (rabeprazole)) can decrease effectiveness of Harvoni. Do not take until you have discussed with a health care provider.    -Antacids that contain magnesium and/or aluminum hydroxide (ie. Milk of Magensia, Rolaids, Gaviscon, Maalox, Mylanta, an dArthritis Pain Formula)can reduce absorption of Harvoni, so take them at least 4 hours before or after Harvoni.  -Calcium carbonate (calcium  supplements or antacids such as Tums, Caltrate, Os-Cal)needs to be taken at least 4 hours hours before or after Harvoni.  -St. John's wort or any products that contain St. John's wort like some herbal supplements  Please inform the office prior to starting any of these medications.  - The common side effects associated with Harvoni include:      1. Fatigue      2. Headache      3. Nausea      4. Diarrhea      5. Insomnia  Please note that this only lists the most common side effects and is NOT a comprehensive list of the potential side effects of these medications. For more information, please review the drug information sheets that come with your medication package from the pharmacy.  ---------------------------------------------------------------- GENERAL HELPFUL HINTS ON HCV THERAPY: 1. Stay well-hydrated. 2. Notify the [facility] of any changes in your other over-the-counter/herbal or prescription medications. 3. If you miss a dose of your medication, take the missed dose as soon as you remember. Return to your regular time/dose schedule the next day.  4.  Do not stop taking your medications without first talking with your healthcare provider. 5.  You may take Tylenol (acetaminophen), as long as the dose is less than 2000 mg (OR no more than 4 tablets of the Tylenol Extra Strengths 500mg  tablet) in 24 hours. 6.  You will see our pharmacist-specialist within the first 2 weeks of starting your medication to monitor for any possible side effects. 7.  You will have labs once during treatment, after soon after treatment completion and one final lab 6 months after treatment completion to verify the virus is out of your system.  Thayer Headings, [place] [facility] [address] [place], [place]  [phone]

## 2017-12-26 DIAGNOSIS — Z23 Encounter for immunization: Secondary | ICD-10-CM | POA: Insufficient documentation

## 2017-12-26 DIAGNOSIS — K7460 Unspecified cirrhosis of liver: Secondary | ICD-10-CM | POA: Insufficient documentation

## 2017-12-26 NOTE — Progress Notes (Signed)
   Subjective:    Patient ID: Melanie Alvarez, female    DOB: 1955-08-22, 62 y.o.   MRN: [id number]  HPI Here for follow up of HCV. She had her labs and elastography.  She has genotype 1a with an elastography with F3/4, though Fibrosure of F0-1.  No cirrhosis on ultrasound.  No associated fatigue.  Hepatitis A and B non-immune.  Negative hepatitis B surface Ag.  Normal creat, LFTs elevated.     Review of Systems  Constitutional: Negative for fatigue.  Skin: Negative for rash.  Neurological: Negative for headaches.       Objective:   Physical Exam  Constitutional: She appears well-developed and well-nourished. No distress.  Eyes: No scleral icterus.  Cardiovascular: Normal rate, regular rhythm and normal heart sounds.  No murmur heard. Pulmonary/Chest: Effort normal and breath sounds normal. No respiratory distress.  Skin: No rash noted.          Assessment & Plan:

## 2017-12-26 NOTE — Assessment & Plan Note (Signed)
Starting hepatitis A and B series today.

## 2017-12-26 NOTE — Assessment & Plan Note (Signed)
Will send for Harvoni for 12 weeks with F3/4 disease.   rtc after starting.

## 2017-12-26 NOTE — Assessment & Plan Note (Signed)
Significant scarring.  Will need continued [place] screening over time.  She will follow up with me after treatment.

## 2017-12-31 ENCOUNTER — Other Ambulatory Visit: Payer: Self-pay | Admitting: Pharmacist Clinician (PhC)/ Clinical Pharmacy Specialist

## 2017-12-31 MED ORDER — LEDIPASVIR-SOFOSBUVIR 90-400 MG PO TABS: 1.0000 | ORAL_TABLET | Freq: Every day | ORAL | 2 refills | Status: DC

## 2017-12-31 NOTE — Progress Notes (Signed)
Have to be transferred to [facility] specialty.

## 2017-12-31 NOTE — Progress Notes (Signed)
Have to be transferred to another specialty

## 2018-01-15 ENCOUNTER — Ambulatory Visit: Payer: BC Managed Care – PPO | Admitting: Internal Medicine

## 2018-01-20 ENCOUNTER — Encounter: Payer: Self-pay | Admitting: Pharmacy Technician

## 2018-01-30 ENCOUNTER — Other Ambulatory Visit: Payer: Self-pay | Admitting: Nurse Practitioner

## 2018-01-30 DIAGNOSIS — Z1231 Encounter for screening mammogram for malignant neoplasm of breast: Secondary | ICD-10-CM

## 2018-02-09 ENCOUNTER — Ambulatory Visit: Payer: BC Managed Care – PPO | Admitting: Internal Medicine

## 2018-02-09 ENCOUNTER — Ambulatory Visit: Payer: BC Managed Care – PPO

## 2018-02-10 ENCOUNTER — Ambulatory Visit: Payer: BC Managed Care – PPO

## 2018-02-10 ENCOUNTER — Ambulatory Visit: Payer: BC Managed Care – PPO | Admitting: Internal Medicine

## 2018-02-25 ENCOUNTER — Ambulatory Visit: Payer: BC Managed Care – PPO

## 2018-03-16 ENCOUNTER — Ambulatory Visit: Payer: BC Managed Care – PPO

## 2018-03-20 ENCOUNTER — Ambulatory Visit: Payer: BC Managed Care – PPO

## 2018-03-23 ENCOUNTER — Ambulatory Visit (INDEPENDENT_AMBULATORY_CARE_PROVIDER_SITE_OTHER): Payer: BC Managed Care – PPO | Admitting: Pharmacist

## 2018-03-23 DIAGNOSIS — B182 Chronic viral hepatitis C: Secondary | ICD-10-CM

## 2018-03-23 NOTE — Progress Notes (Signed)
HPI: Melanie Alvarez is a 62 y.o. female who presents to the [facility] for Hep C follow-up. 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  Patient Active Problem List   Diagnosis Date Noted  . Liver fibrosis 12/26/2017  . Vaccine counseling 12/26/2017  . Gouty arthritis 10/20/2017  . Hypokalemia 10/20/2017  . Hypertension 08/13/2012  . DM (diabetes mellitus) ([place]) 08/13/2012  . Tobacco abuse 08/13/2012  . Chronic hepatitis C without hepatic coma ([place]) 08/13/2012  . Obesity, morbid, BMI 40.0-49.9 ([place]) 08/13/2012    Patient's Medications  New Prescriptions   No medications on file  Previous Medications   ALBUTEROL (PROVENTIL HFA;VENTOLIN HFA) 108 (90 BASE) MCG/ACT INHALER    Inhale 1-2 puffs into the lungs every 6 (six) hours as needed.   ALLOPURINOL (ZYLOPRIM) 100 MG TABLET    Take 1 tablet (100 mg total) by mouth 2 (two) times daily.   AMLODIPINE (NORVASC) 5 MG TABLET    Take 1 tablet (5 mg total) by mouth at bedtime.   ASPIRIN 81 MG CHEWABLE TABLET    Chew 81 mg by mouth daily.   BLOOD GLUCOSE METER KIT AND SUPPLIES KIT    Dispense based on patient and insurance preference. Use once a day (before breakfast). E11.9   CHOLECALCIFEROL (VITAMIN D) 400 UNITS TABS TABLET    Take 400 Units by mouth daily.   FLUTICASONE (FLONASE) 50 MCG/ACT NASAL SPRAY    Place 2 sprays into both nostrils daily.   KLOR-CON M10 10 MEQ TABLET       LEDIPASVIR-SOFOSBUVIR (HARVONI) 90-400 MG TABS    Take 1 tablet by mouth daily.   LISINOPRIL-HYDROCHLOROTHIAZIDE (ZESTORETIC) 20-12.5 MG TABLET    Take 2 tablets by mouth daily.   METFORMIN (GLUCOPHAGE) 500 MG TABLET    Take 1 tablet (500 mg total) by mouth 2 (two) times daily with a meal.   METRONIDAZOLE (FLAGYL) 500 MG TABLET    Take 2 tablets (1,000 mg total) by mouth 2 (two) times daily.   OMEPRAZOLE (PRILOSEC) 20 MG CAPSULE    Take 1 capsule (20 mg total) by mouth  daily.   POTASSIUM CHLORIDE (K-DUR) 10 MEQ TABLET    Take 1 tablet (10 mEq total) by mouth 2 (two) times daily after a meal.  Modified Medications   No medications on file  Discontinued Medications   No medications on file    Allergies: No Known Allergies  Past Medical History: Past Medical History:  Diagnosis Date  . Diabetes mellitus 2012  . Hepatitis 2010   history of Hepatitis C  . Hypertension 2011  . Obesity     Social History: Social History   Socioeconomic History  . Marital status: Married    Spouse name: Not on file  . Number of children: Not on file  . Years of education: Not on file  . Highest education level: Not on file  Occupational History  . Not on file  Social Needs  . Financial resource strain: Not on file  . Food insecurity:    Worry: Not on file    Inability: Not on file  . Transportation needs:    Medical: Not on file    Non-medical: Not on file  Tobacco Use  . Smoking status: Current Some Day Smoker    Packs/day: 0.50    Types: Cigarettes  . Smokeless tobacco: Never Used  Substance and Sexual Activity  .  Alcohol use: Yes    Alcohol/week: 18.0 oz    Types: 24 Cans of beer, 6 Shots of liquor per week    Comment: weekends  . Drug use: No    Comment: previously   . Sexual activity: Yes    Partners: Male  Lifestyle  . Physical activity:    Days per week: Not on file    Minutes per session: Not on file  . Stress: Not on file  Relationships  . Social connections:    Talks on phone: Not on file    Gets together: Not on file    Attends religious service: Not on file    Active member of club or organization: Not on file    Attends meetings of clubs or organizations: Not on file    Relationship status: Not on file  Other Topics Concern  . Not on file  Social History Narrative   Previous [facility] pt.  Last MD was Dr. Delman Cheadle, seen 12/2011      Works part time in [place] with husband     Labs: Hepatitis C Lab Results  Component Value Date   HCVGENOTYPE 1a 11/25/2017   HCVRNAPCRQN 2,510,000 (H) 11/25/2017   FIBROSTAGE F0-F1 11/25/2017   Hepatitis B Lab Results  Component Value Date   HEPBSAB NON-REACTIVE 11/25/2017   HEPBSAG NON-REACTIVE 11/25/2017   HEPBCAB NON-REACTIVE 11/25/2017   Hepatitis A Lab Results  Component Value Date   HAV NON-REACTIVE 11/25/2017   HIV Lab Results  Component Value Date   HIV NON-REACTIVE 11/25/2017   Lab Results  Component Value Date   CREATININE 0.67 11/25/2017   CREATININE 0.77 10/17/2017   CREATININE 0.76 09/02/2016   CREATININE 0.71 09/19/2014   CREATININE 0.69 09/30/2012   Lab Results  Component Value Date   AST 39 (H) 11/25/2017   AST 60 (H) 10/17/2017   AST 64 (H) 09/02/2016   ALT 40 (H) 11/25/2017   ALT 38 (H) 11/25/2017   ALT 51 (H) 10/17/2017   INR 1.0 11/25/2017    Assessment: Melanie Alvarez is here today to follow-up for Hep C. She started 12 weeks of Harvoni back on 6/5.  She just took her last pill of her 2nd bottle. She states that she cannot afford [facility]rd [facility].  She tells me that she has paid $250 the last 2 months for it and does not have the $250 this month.  This is quite ridiculous as she has Pharmacist, community and there is a co-pay card for [employer].  Inez Catalina activated one for her, and I gave it to her so she wouldn't have to pay for her 3rd month.  I apologized many times and told her that [facility] should have helped [facility] with the [facility]pay.  She hasn't missed any doses and should get her refill before running out.  No side effects on the Harvoni. She will get labs today and f/u with Dr. Linus Salmons for her cure visits.   Plan: - Continue Harvoni x 12 weeks - Hep C VL and CMET today - F/u for SVR12 labs 12/2 at 215pm - F/u with Dr. Linus Salmons 12/9 at 230pm  Cassie L. Kuppelweiser, PharmD, [place], [place] [facility] 03/23/2018, 3:08  PM

## 2018-03-25 LAB — COMPREHENSIVE METABOLIC PANEL
AG Ratio: 1 (calc) (ref 1.0–2.5)
ALT: 15 U/L (ref 6–29)
AST: 16 U/L (ref 10–35)
Albumin: 3.9 g/dL (ref 3.6–5.1)
Alkaline phosphatase (APISO): 66 U/L (ref 33–130)
BUN: 10 mg/dL (ref 7–25)
CHLORIDE: 96 mmol/L — AB (ref 98–110)
CO2: 33 mmol/L — ABNORMAL HIGH (ref 20–32)
Calcium: 9.8 mg/dL (ref 8.6–10.4)
Creat: 0.78 mg/dL (ref 0.50–0.99)
GLOBULIN: 4 g/dL — AB (ref 1.9–3.7)
GLUCOSE: 167 mg/dL — AB (ref 65–99)
POTASSIUM: 4 mmol/L (ref 3.5–5.3)
Sodium: 137 mmol/L (ref 135–146)
Total Bilirubin: 0.5 mg/dL (ref 0.2–1.2)
Total Protein: 7.9 g/dL (ref 6.1–8.1)

## 2018-03-25 LAB — HEPATITIS C RNA QUANTITATIVE
HCV Quantitative Log: <1.18 Log IU/mL
HCV RNA, PCR, QN: <15 IU/mL

## 2018-03-27 ENCOUNTER — Telehealth: Payer: Self-pay | Admitting: Pharmacist

## 2018-03-27 NOTE — Telephone Encounter (Signed)
Patient called and said that her friend is having a surprise birthday party and they are having wine and she wanted to know if she could have a glass of wine.  I told her that would be fine and to just not go overboard. I also told her to just make sure she took her Harvoni.

## 2018-03-28 ENCOUNTER — Other Ambulatory Visit: Payer: Self-pay | Admitting: Nurse Practitioner

## 2018-03-28 DIAGNOSIS — I10 Essential (primary) hypertension: Secondary | ICD-10-CM

## 2018-03-31 ENCOUNTER — Ambulatory Visit: Payer: BC Managed Care – PPO

## 2018-04-03 ENCOUNTER — Ambulatory Visit: Payer: BC Managed Care – PPO | Admitting: Nurse Practitioner

## 2018-04-03 ENCOUNTER — Encounter: Payer: Self-pay | Admitting: Nurse Practitioner

## 2018-04-03 VITALS — BP 158/98 | HR 80 | Temp 98.0°F | Ht 63.0 in | Wt 243.4 lb

## 2018-04-03 DIAGNOSIS — I10 Essential (primary) hypertension: Secondary | ICD-10-CM | POA: Diagnosis not present

## 2018-04-03 DIAGNOSIS — M109 Gout, unspecified: Secondary | ICD-10-CM

## 2018-04-03 DIAGNOSIS — E782 Mixed hyperlipidemia: Secondary | ICD-10-CM

## 2018-04-03 DIAGNOSIS — E118 Type 2 diabetes mellitus with unspecified complications: Secondary | ICD-10-CM | POA: Diagnosis not present

## 2018-04-03 DIAGNOSIS — E876 Hypokalemia: Secondary | ICD-10-CM

## 2018-04-03 LAB — CBC
HEMATOCRIT: 40.3 % (ref 36.0–46.0)
Hemoglobin: 13.9 g/dL (ref 12.0–15.0)
MCHC: 34.5 g/dL (ref 30.0–36.0)
MCV: 85.5 fl (ref 78.0–100.0)
Platelets: 365 10*3/uL (ref 150.0–400.0)
RBC: 4.71 Mil/uL (ref 3.87–5.11)
RDW: 12.6 % (ref 11.5–15.5)
WBC: 11.2 10*3/uL — AB (ref 4.0–10.5)

## 2018-04-03 LAB — LIPID PANEL
CHOL/HDL RATIO: 5
Cholesterol: 203 mg/dL — ABNORMAL HIGH (ref 0–200)
HDL: 42.3 mg/dL (ref >39.00)
LDL CALC: 138 mg/dL — AB (ref 0–99)
NONHDL: 160.57
Triglycerides: 114 mg/dL (ref 0.0–149.0)
VLDL: 22.8 mg/dL (ref 0.0–40.0)

## 2018-04-03 LAB — URIC ACID: Uric Acid, Serum: 8.6 mg/dL — ABNORMAL HIGH (ref 2.4–7.0)

## 2018-04-03 LAB — HEMOGLOBIN A1C: HEMOGLOBIN A1C: 6.8 % — AB (ref 4.6–6.5)

## 2018-04-03 MED ORDER — METFORMIN HCL 500 MG PO TABS: 500.0000 mg | ORAL_TABLET | Freq: Two times a day (BID) | ORAL | 1 refills | Status: DC

## 2018-04-03 MED ORDER — POTASSIUM CHLORIDE ER 10 MEQ PO TBCR: 10.0000 meq | EXTENDED_RELEASE_TABLET | Freq: Two times a day (BID) | ORAL | 1 refills | Status: DC

## 2018-04-03 MED ORDER — LISINOPRIL-HYDROCHLOROTHIAZIDE 20-12.5 MG PO TABS: 2.0000 | ORAL_TABLET | Freq: Every day | ORAL | 1 refills | Status: DC

## 2018-04-03 NOTE — Patient Instructions (Addendum)
Stable hgbA1c at 6.8, Mild elevation if uric acid 7.0 to 8.6. In the absence of gout exacerbation, will continue to monitor. Stable CBC. Lipid panel: elevated total cholesterol and LDL. This put risk for cardiovascular risk at 18.80%. This should be <7%. Therefore I will recommend use of cholesterol lowering agent like atorvastatin. Let me know if you are will to start this medication.  Continue current medications.  Maintain DASH diet and start walking daily (30-69mins).   Diabetes Mellitus and Nutrition When you have diabetes (diabetes mellitus), it is very important to have healthy eating habits because your blood sugar (glucose) levels are greatly affected by what you eat and drink. Eating healthy foods in the appropriate amounts, at about the same times every day, can help you:  Control your blood glucose.  Lower your risk of heart disease.  Improve your blood pressure.  Reach or maintain a healthy weight.  Every person with diabetes is different, and each person has different needs for a meal plan. Your health care provider may recommend that you work with a diet and nutrition specialist (dietitian) to make a meal plan that is best for you. Your meal plan may vary depending on factors such as:  The calories you need.  The medicines you take.  Your weight.  Your blood glucose, blood pressure, and cholesterol levels.  Your activity level.  Other health conditions you have, such as heart or kidney disease.  How do carbohydrates affect me? Carbohydrates affect your blood glucose level more than any other type of food. Eating carbohydrates naturally increases the amount of glucose in your blood. Carbohydrate counting is a method for keeping track of how many carbohydrates you eat. Counting carbohydrates is important to keep your blood glucose at a healthy level, especially if you use insulin or take certain oral diabetes medicines. It is important to know how many carbohydrates  you can safely have in each meal. This is different for every person. Your dietitian can help you calculate how many carbohydrates you should have at each meal and for snack. Foods that contain carbohydrates include:  Bread, cereal, rice, pasta, and crackers.  Potatoes and corn.  Peas, beans, and lentils.  Milk and yogurt.  Fruit and juice.  Desserts, such as cakes, cookies, ice cream, and candy.  How does alcohol affect me? Alcohol can cause a sudden decrease in blood glucose (hypoglycemia), especially if you use insulin or take certain oral diabetes medicines. Hypoglycemia can be a life-threatening condition. Symptoms of hypoglycemia (sleepiness, dizziness, and confusion) are similar to symptoms of having too much alcohol. If your health care provider says that alcohol is safe for you, follow these guidelines:  Limit alcohol intake to no more than 1 drink per day for nonpregnant women and 2 drinks per day for men. One drink equals 12 oz of beer, 5 oz of wine, or 1 oz of hard liquor.  Do not drink on an empty stomach.  Keep yourself hydrated with water, diet soda, or unsweetened iced tea.  Keep in mind that regular soda, juice, and other mixers may contain a lot of sugar and must be counted as carbohydrates.  What are tips for following this plan? Reading food labels  Start by checking the serving size on the label. The amount of calories, carbohydrates, fats, and other nutrients listed on the label are based on one serving of the food. Many foods contain more than one serving per package.  Check the total grams (g) of carbohydrates in one  serving. You can calculate the number of servings of carbohydrates in one serving by dividing the total carbohydrates by 15. For example, if a food has 30 g of total carbohydrates, it would be equal to 2 servings of carbohydrates.  Check the number of grams (g) of saturated and trans fats in one serving. Choose foods that have low or no amount  of these fats.  Check the number of milligrams (mg) of sodium in one serving. Most people should limit total sodium intake to less than 2,300 mg per day.  Always check the nutrition information of foods labeled as "low-fat" or "nonfat". These foods may be higher in added sugar or refined carbohydrates and should be avoided.  Talk to your dietitian to identify your daily goals for nutrients listed on the label. Shopping  Avoid buying canned, premade, or processed foods. These foods tend to be high in fat, sodium, and added sugar.  Shop around the outside edge of the grocery store. This includes fresh fruits and vegetables, bulk grains, fresh meats, and fresh dairy. Cooking  Use low-heat cooking methods, such as baking, instead of high-heat cooking methods like deep frying.  Cook using healthy oils, such as olive, canola, or sunflower oil.  Avoid cooking with butter, cream, or high-fat meats. Meal planning  Eat meals and snacks regularly, preferably at the same times every day. Avoid going long periods of time without eating.  Eat foods high in fiber, such as fresh fruits, vegetables, beans, and whole grains. Talk to your dietitian about how many servings of carbohydrates you can eat at each meal.  Eat 4-6 ounces of lean protein each day, such as lean meat, chicken, fish, eggs, or tofu. 1 ounce is equal to 1 ounce of meat, chicken, or fish, 1 egg, or 1/4 cup of tofu.  Eat some foods each day that contain healthy fats, such as avocado, nuts, seeds, and fish. Lifestyle   Check your blood glucose regularly.  Exercise at least 30 minutes 5 or more days each week, or as told by your health care provider.  Take medicines as told by your health care provider.  Do not use any products that contain nicotine or tobacco, such as cigarettes and e-cigarettes. If you need help quitting, ask your health care provider.  Work with a Social worker or diabetes educator to identify strategies to manage  stress and any emotional and social challenges. What are some questions to ask my health care provider?  Do I need to meet with a diabetes educator?  Do I need to meet with a dietitian?  What number can I call if I have questions?  When are the best times to check my blood glucose? Where to find more information:  American Diabetes Association: diabetes.org/food-and-fitness/food  Academy of Nutrition and Dietetics: PokerClues.dk  [employer] of Diabetes and Digestive and Kidney Diseases (NIH): ContactWire.be Summary  A healthy meal plan will help you control your blood glucose and maintain a healthy lifestyle.  Working with a diet and nutrition specialist (dietitian) can help you make a meal plan that is best for you.  Keep in mind that carbohydrates and alcohol have immediate effects on your blood glucose levels. It is important to count carbohydrates and to use alcohol carefully. This information is not intended to replace advice given to you by your health care provider. Make sure you discuss any questions you have with your health care provider. Document Released: 05/02/2005 Document Revised: 09/09/2016 Document Reviewed: 09/09/2016 Elsevier Interactive Patient Education  2018 Elsevier Inc.  

## 2018-04-03 NOTE — Progress Notes (Signed)
Subjective:  Patient ID: Melanie Alvarez, female    DOB: Mar 20, 1956  Age: 62 y.o. MRN: [id number]  CC: Follow-up (f/u BP and DM . Pt has logs w/ her. blood sugars have been up and down and BP has been stable. Pt has to reschedule mammogram. pt fasting , refills on amlodapine?)   HPI   DM: Last hgbA1c 6.8 Home glucose reading: March:130-140, April: 130-150, May: 150-160, June: 120-130, July: 110-120, August: 130-140.  Glucose noted to be elevated with high sugar and carb diet. Current use of metformin, no adverse effects noted.  HTN: Elevated today. Reports she is compliant with medication (lisinopril/HCTZ and amlodipine) BP Readings from Last 3 Encounters:  04/03/18 (!) 158/98  12/24/17 122/81  11/25/17 138/90   Gout: Allopurinol was discontinue by Dr. Linus Salmons. Denies any gout exacerbation. Last uric acid level of 7.0.  Reviewed past Medical, Social and Family history today.  Outpatient Medications Prior to Visit  Medication Sig Dispense Refill  . albuterol (PROVENTIL HFA;VENTOLIN HFA) 108 (90 Base) MCG/ACT inhaler Inhale 1-2 puffs into the lungs every 6 (six) hours as needed. 1 Inhaler 0  . amLODipine (NORVASC) 5 MG tablet TAKE 1 TABLET BY MOUTH AT BEDTIME 90 tablet 1  . aspirin 81 MG chewable tablet Chew 81 mg by mouth daily.    . blood glucose meter kit and supplies KIT Dispense based on patient and insurance preference. Use once a day (before breakfast). E11.9 1 each 3  . cholecalciferol (VITAMIN D) 400 UNITS TABS tablet Take 400 Units by mouth daily.    Marland Kitchen KLOR-CON M10 10 MEQ tablet     . Ledipasvir-Sofosbuvir (HARVONI) 90-400 MG TABS Take 1 tablet by mouth daily. 28 tablet 2  . lisinopril-hydrochlorothiazide (ZESTORETIC) 20-12.5 MG tablet Take 2 tablets by mouth daily. 180 tablet 1  . metFORMIN (GLUCOPHAGE) 500 MG tablet Take 1 tablet (500 mg total) by mouth 2 (two) times daily with a meal. 180 tablet 1  . potassium chloride (K-DUR) 10 MEQ tablet Take 1 tablet (10 mEq  total) by mouth 2 (two) times daily after a meal. 180 tablet 1  . allopurinol (ZYLOPRIM) 100 MG tablet Take 1 tablet (100 mg total) by mouth 2 (two) times daily. (Patient not taking: Reported on 04/03/2018) 180 tablet 1  . fluticasone (FLONASE) 50 MCG/ACT nasal spray Place 2 sprays into both nostrils daily. (Patient not taking: Reported on 04/03/2018) 16 g 0  . metroNIDAZOLE (FLAGYL) 500 MG tablet Take 2 tablets (1,000 mg total) by mouth 2 (two) times daily. (Patient not taking: Reported on 04/03/2018) 8 tablet 0  . omeprazole (PRILOSEC) 20 MG capsule Take 1 capsule (20 mg total) by mouth daily. (Patient not taking: Reported on 04/03/2018) 30 capsule 0   No facility-administered medications prior to visit.     ROS See HPI  Objective:  BP (!) 158/98 (BP Location: Right Arm, Patient Position: Sitting, Cuff Size: Normal)   Pulse 80   Temp 98 F (36.7 C) (Oral)   Ht _0  (1.6 m)   Wt 243 lb 6.4 oz (110.4 kg)   SpO2 94%   BMI 43.12 kg/m   BP Readings from Last 3 Encounters:  04/03/18 (!) 158/98  12/24/17 122/81  11/25/17 138/90    Wt Readings from Last 3 Encounters:  04/03/18 243 lb 6.4 oz (110.4 kg)  12/24/17 244 lb 6.4 oz (110.9 kg)  11/25/17 246 lb (111.6 kg)    Physical Exam  Constitutional: She is oriented to person, place, and time. No  distress.  Cardiovascular: Normal rate, regular rhythm and normal heart sounds.  Pulmonary/Chest: Effort normal and breath sounds normal.  Abdominal: Soft. Bowel sounds are normal.  Musculoskeletal: She exhibits no edema or tenderness.  Neurological: She is alert and oriented to person, place, and time.  Skin: Skin is warm and dry. No rash noted.  Psychiatric: She has a normal mood and affect. Her behavior is normal. Thought content normal.  Vitals reviewed.   Lab Results  Component Value Date   WBC 11.2 (H) 04/03/2018   HGB 13.9 04/03/2018   HCT 40.3 04/03/2018   PLT 365.0 04/03/2018   GLUCOSE 167 (H) 03/23/2018   CHOL 203 (H)  04/03/2018   TRIG 114.0 04/03/2018   HDL 42.30 04/03/2018   LDLDIRECT 110 (H) 09/30/2012   LDLCALC 138 (H) 04/03/2018   ALT 15 03/23/2018   AST 16 03/23/2018   NA 137 03/23/2018   K 4.0 03/23/2018   CL 96 (L) 03/23/2018   CREATININE 0.78 03/23/2018   BUN 10 03/23/2018   CO2 33 (H) 03/23/2018   TSH 1.959 03/19/2010   INR 1.0 11/25/2017   HGBA1C 6.8 (H) 04/03/2018   MICROALBUR 2.5 10/17/2017    [place] Elastography Liver  Result Date: 12/09/2017 CLINICAL DATA:  Hepatitis-C EXAM: US LIVER ELASTOGRAPHY TECHNIQUE: Ultrasound elastography evaluation of the liver was performed. A region of interest was placed in the right lobe of the liver. Following application of a compressive sonographic pulse, shear waves were detected in the adjacent hepatic tissue and the shear wave velocity was calculated. Multiple assessments were performed at the selected site. Median shear wave velocity is correlated to a Metavir fibrosis score. COMPARISON:  None. FINDINGS: Liver: Increased echotexture compatible with fatty infiltration or intrinsic liver disease. No focal abnormality or biliary ductal dilatation. Portal vein is patent on color Doppler imaging with normal direction of blood flow towards the liver. ULTRASOUND HEPATIC ELASTOGRAPHY Device: Siemens Helix VTQ Patient position: Supine Transducer 4V1 Number of measurements: 10 Hepatic segment:  Eight Median velocity:   3.26 m/sec IQR: 0.52 IQR/Median velocity ratio: 0.16 Corresponding Metavir fibrosis score:  Some F3 + F4 Risk of fibrosis: High Limitations of exam: None Please note that abnormal shear wave velocities may also be identified in clinical settings other than with hepatic fibrosis, such as: acute hepatitis, elevated right heart and central venous pressures including use of beta blockers, veno-occlusive disease (Budd-Chiari), infiltrative processes such as mastocytosis/amyloidosis/infiltrative tumor, extrahepatic cholestasis, in the post-prandial state, and  liver transplantation. Correlation with patient history, laboratory data, and clinical condition recommended. IMPRESSION: Liver: Increased echotexture compatible with fatty infiltration or intrinsic liver disease. Elastography: Median hepatic shear wave velocity is calculated at 3.26 m/sec. Corresponding Metavir fibrosis score is Some F3 + F4 Risk of fibrosis is High. Follow-up: Follow up advised Electronically Signed   By: Rolm Baptise M.D.   On: 12/09/2017 09:20    Assessment & Plan:   Giovannina was seen today for follow-up.  Diagnoses and all orders for this visit:  Type 2 diabetes mellitus with complication, without long-term current use of insulin (HCC) -     Hemoglobin A1c -     metFORMIN (GLUCOPHAGE) 500 MG tablet; Take 1 tablet (500 mg total) by mouth 2 (two) times daily with a meal.  Essential hypertension -     lisinopril-hydrochlorothiazide (ZESTORETIC) 20-12.5 MG tablet; Take 2 tablets by mouth daily. -     potassium chloride (K-DUR) 10 MEQ tablet; Take 1 tablet (10 mEq total) by mouth 2 (two) times  daily after a meal. -     CBC  Mixed hyperlipidemia -     Lipid panel -     atorvastatin (LIPITOR) 20 MG tablet; Take 1 tablet (20 mg total) by mouth daily at 6 PM.  Hypokalemia -     potassium chloride (K-DUR) 10 MEQ tablet; Take 1 tablet (10 mEq total) by mouth 2 (two) times daily after a meal.  Gouty arthritis -     Uric acid   I have discontinued Melanie Alvarez's omeprazole, fluticasone, and metroNIDAZOLE. I am also having her start on atorvastatin. Additionally, I am having her maintain her cholecalciferol, aspirin, allopurinol, albuterol, KLOR-CON M10, blood glucose meter kit and supplies, Ledipasvir-Sofosbuvir, amLODipine, metFORMIN, lisinopril-hydrochlorothiazide, and potassium chloride.  Meds ordered this encounter  Medications  . metFORMIN (GLUCOPHAGE) 500 MG tablet    Sig: Take 1 tablet (500 mg total) by mouth 2 (two) times daily with a meal.    Dispense:  180  tablet    Refill:  1    Order Specific Question:   Supervising Provider    Answer:   Clearance Coots E [[id number]]  . lisinopril-hydrochlorothiazide (ZESTORETIC) 20-12.5 MG tablet    Sig: Take 2 tablets by mouth daily.    Dispense:  180 tablet    Refill:  1    Order Specific Question:   Supervising Provider    Answer:   Clearance Coots E [[id number]]  . potassium chloride (K-DUR) 10 MEQ tablet    Sig: Take 1 tablet (10 mEq total) by mouth 2 (two) times daily after a meal.    Dispense:  180 tablet    Refill:  1    Order Specific Question:   Supervising Provider    Answer:   Clearance Coots E [[id number]]  . atorvastatin (LIPITOR) 20 MG tablet    Sig: Take 1 tablet (20 mg total) by mouth daily at 6 PM.    Dispense:  90 tablet    Refill:  1    Order Specific Question:   Supervising Provider    Answer:   Zigmund Daniel, CODY [[id number]]    Follow-up: Return in about 6 months (around 10/04/2018) for DM and HTN(fasting).  Wilfred Lacy, NP

## 2018-04-06 ENCOUNTER — Encounter: Payer: Self-pay | Admitting: Nurse Practitioner

## 2018-04-06 DIAGNOSIS — E782 Mixed hyperlipidemia: Secondary | ICD-10-CM | POA: Insufficient documentation

## 2018-04-06 MED ORDER — ATORVASTATIN CALCIUM 20 MG PO TABS
20.0000 mg | ORAL_TABLET | Freq: Every day | ORAL | 1 refills | Status: DC
Start: 2018-04-06 — End: 2018-10-06

## 2018-05-08 ENCOUNTER — Ambulatory Visit: Payer: BC Managed Care – PPO | Admitting: Nurse Practitioner

## 2018-07-20 ENCOUNTER — Other Ambulatory Visit: Payer: BC Managed Care – PPO

## 2018-07-21 ENCOUNTER — Other Ambulatory Visit: Payer: BC Managed Care – PPO

## 2018-07-21 ENCOUNTER — Other Ambulatory Visit: Payer: Self-pay | Admitting: Behavioral Health

## 2018-07-21 DIAGNOSIS — B182 Chronic viral hepatitis C: Secondary | ICD-10-CM

## 2018-07-21 DIAGNOSIS — E118 Type 2 diabetes mellitus with unspecified complications: Secondary | ICD-10-CM

## 2018-07-21 NOTE — Progress Notes (Addendum)
Patient came in today for lab work.  She states she left work early because she did not feel well.  She states she felt nauseous and light-headed at work.  Patient came in for lab work and wanted blood pressure check.  B/p was 137/93 and heart rate 89.  Patient states she is diabetic however has not been checking her blood glucose because she ran out of test strips.  She takes metformin 500mg  twice daily.  Patient also states she has not eaten anything all day.  Patient given ginger rale and crackers and states she feels much better.  Verbal order from Dr. Megan Salon to get POCT CBG. Patient finger stick blood sugar checked and it was 157.  Patient states she will follow up with her PCP.  Patient states she felt much better when she left. Pricilla Riffle RN

## 2018-07-23 LAB — HEPATITIS C RNA QUANTITATIVE
HCV Quantitative Log: <1.18 Log IU/mL
HCV RNA, PCR, QN: <15 IU/mL

## 2018-07-27 ENCOUNTER — Ambulatory Visit (INDEPENDENT_AMBULATORY_CARE_PROVIDER_SITE_OTHER): Payer: BC Managed Care – PPO | Admitting: Internal Medicine

## 2018-07-27 ENCOUNTER — Encounter: Payer: Self-pay | Admitting: Internal Medicine

## 2018-07-27 ENCOUNTER — Other Ambulatory Visit: Payer: BC Managed Care – PPO

## 2018-07-27 VITALS — BP 147/85 | HR 106 | Temp 98.0°F | Wt 242.0 lb

## 2018-07-27 DIAGNOSIS — B182 Chronic viral hepatitis C: Secondary | ICD-10-CM | POA: Diagnosis not present

## 2018-07-27 DIAGNOSIS — K7460 Unspecified cirrhosis of liver: Secondary | ICD-10-CM | POA: Diagnosis not present

## 2018-07-27 DIAGNOSIS — Z23 Encounter for immunization: Secondary | ICD-10-CM

## 2018-07-27 DIAGNOSIS — K7400 Hepatic fibrosis, unspecified: Secondary | ICD-10-CM

## 2018-07-27 NOTE — Assessment & Plan Note (Signed)
She has advanced fibrosis on her elastography but her other tests really do not suggest cirrhosis.  I though will continue to scan for [facility] twice a year as it is unclear.   [place] now and in 6 months.  Follow up in 1 year.

## 2018-07-27 NOTE — Progress Notes (Signed)
   Subjective:    Patient ID: Melanie Alvarez, female    DOB: 1955-12-19, 62 y.o.   MRN: [id number]  HPI Here for follow up of chronic hepatitis C She has genotype 1 with an elastography of F3/F4 but fibro-sure of just F 0-1 disease.  She started on and completed 12 weeks of Harvoni.  She had no significant issues.  She is now here for SVR 12 and does have an undetectable viral load again conferring cure.  She is pleased with this.  She is getting the hepatitis A and B series and is due for both #2 today.   Review of Systems  Constitutional: Negative for fatigue.  Gastrointestinal: Negative for nausea.  Skin: Negative for rash.       Objective:   Physical Exam  Constitutional: She appears well-nourished.  Eyes: No scleral icterus.  Cardiovascular: Normal rate, regular rhythm and normal heart sounds.  Pulmonary/Chest: Effort normal and breath sounds normal.  Skin: No rash noted.   SH: occasional alcohol       Assessment & Plan:

## 2018-07-27 NOTE — Assessment & Plan Note (Signed)
Discussed the hepatitis series and #2 given today for both

## 2018-07-27 NOTE — Assessment & Plan Note (Signed)
Now considered cured.  Ab will remain positive.

## 2018-08-10 ENCOUNTER — Ambulatory Visit
Admission: RE | Admit: 2018-08-10 | Discharge: 2018-08-10 | Disposition: A | Discharge status: Home / Self Care | Payer: BC Managed Care – PPO | Source: Ambulatory Visit | Attending: Internal Medicine | Admitting: Internal Medicine

## 2018-08-10 DIAGNOSIS — K7400 Hepatic fibrosis, unspecified: Secondary | ICD-10-CM

## 2018-08-31 ENCOUNTER — Emergency Department (HOSPITAL_COMMUNITY)
Admission: EM | Admit: 2018-08-31 | Discharge: 2018-08-31 | Disposition: A | Discharge status: Home / Self Care | Payer: BC Managed Care – PPO | Attending: Emergency Medicine | Admitting: Emergency Medicine

## 2018-08-31 ENCOUNTER — Ambulatory Visit: Payer: Self-pay | Admitting: *Deleted

## 2018-08-31 ENCOUNTER — Encounter (HOSPITAL_COMMUNITY): Payer: Self-pay | Admitting: *Deleted

## 2018-08-31 DIAGNOSIS — E119 Type 2 diabetes mellitus without complications: Secondary | ICD-10-CM | POA: Insufficient documentation

## 2018-08-31 DIAGNOSIS — R220 Localized swelling, mass and lump, head: Secondary | ICD-10-CM | POA: Diagnosis present

## 2018-08-31 DIAGNOSIS — Z7982 Long term (current) use of aspirin: Secondary | ICD-10-CM | POA: Insufficient documentation

## 2018-08-31 DIAGNOSIS — I10 Essential (primary) hypertension: Secondary | ICD-10-CM | POA: Insufficient documentation

## 2018-08-31 DIAGNOSIS — Z7984 Long term (current) use of oral hypoglycemic drugs: Secondary | ICD-10-CM | POA: Diagnosis not present

## 2018-08-31 DIAGNOSIS — F17210 Nicotine dependence, cigarettes, uncomplicated: Secondary | ICD-10-CM | POA: Insufficient documentation

## 2018-08-31 DIAGNOSIS — Z79899 Other long term (current) drug therapy: Secondary | ICD-10-CM | POA: Diagnosis not present

## 2018-08-31 DIAGNOSIS — T783XXA Angioneurotic edema, initial encounter: Secondary | ICD-10-CM

## 2018-08-31 MED ORDER — FAMOTIDINE 20 MG PO TABS
20.0000 mg | ORAL_TABLET | Freq: Once | ORAL | Status: AC
  Administered 2018-08-31: 20 mg via ORAL
  Filled 2018-08-31: qty 1

## 2018-08-31 MED ORDER — TRIAMTERENE-HCTZ 37.5-25 MG PO TABS: 1.0000 | ORAL_TABLET | Freq: Every day | ORAL | 1 refills | Status: DC

## 2018-08-31 MED ORDER — POTASSIUM CHLORIDE CRYS ER 10 MEQ PO TBCR: 10.0000 meq | EXTENDED_RELEASE_TABLET | Freq: Every day | ORAL | 1 refills | Status: DC

## 2018-08-31 MED ORDER — FAMOTIDINE 20 MG PO TABS: 20.0000 mg | ORAL_TABLET | Freq: Two times a day (BID) | ORAL | 0 refills | Status: DC

## 2018-08-31 MED ORDER — ACETAMINOPHEN 325 MG PO TABS
650.0000 mg | ORAL_TABLET | Freq: Once | ORAL | Status: AC
  Administered 2018-08-31: 650 mg via ORAL
  Filled 2018-08-31: qty 2

## 2018-08-31 NOTE — Discharge Instructions (Addendum)
Please read the attached information.  Your symptoms have improved while in the emergency room anticipate that they will continue to improve.  If he have any new or worsening signs or symptoms return immediately to the emergency room.  I suggest that you do not take your lisinopril (pressure medication).  Please contact your primary care provider today inform them of your visit today and need for reevaluation for ongoing management of blood pressure and swelling.  Please use medication as directed

## 2018-08-31 NOTE — Telephone Encounter (Signed)
Pt calling stating that when she woke up this morning around 6 am she noticed that she had swelling to the left side of face under cheek and upper lip. Pt denies any pain or tingling to the left side of face.Pt states she is taking Lisinopril. Pt states she is currently at work and her face looks better than it did initially and states that when she first woke up her face looked twisted. Pt states she took Aleve for current symptoms.Pt advised that she would need to be seen in the ED. Spoke with pt's supervisor at [employer] and notified her that the pt needed to be seen in the ED DReason for Disposition . Taking an ACE Inhibitor medication  (e.g., benazepril/LOTENSIN, captopril/CAPOTEN, enalapril/VASOTEC, lisinopril/ZESTRIL)  Answer Assessment - Initial Assessment Questions 1. ONSET: "When did the swelling start?" (e.g., minutes, hours, days)     This morning when I woke about 6 am 2. LOCATION: "What part of the face is swollen?"     On the left side up under eye by the sick 3. SEVERITY: "How swollen is it?"    On a scale of 1-10 it is  about a 5 4. ITCHING: "Is there any itching?" If so, ask: "How much?"   (Scale 1-10; mild, moderate or severe)     no 5. PAIN: "Is the swelling painful to touch?" If so, ask: "How painful is it?"   (Scale 1-10; mild, moderate or severe)     no 6. FEVER: "Do you have a fever?" If so, ask: "What is it, how was it measured, and when did it start?"      no 7. CAUSE: "What do you think is causing the face swelling?"     No 8. RECURRENT SYMPTOM: "Have you had face swelling before?" If so, ask: "When was the last time?" "What happened that time?"     No 9. OTHER SYMPTOMS: "Do you have any other symptoms?" (e.g., toothache, leg swelling)     NO 10. PREGNANCY: "Is there any chance you are pregnant?" "When was your last menstrual period?"       n/a  Protocols used: [facility]

## 2018-08-31 NOTE — Telephone Encounter (Signed)
pt called to notify PCP that she is at the ED; the pt says that she has no further questions

## 2018-08-31 NOTE — Addendum Note (Signed)
Addended by: Leana Gamer on: 08/31/2018 04:21 PM   Modules accepted: Orders

## 2018-08-31 NOTE — Telephone Encounter (Signed)
Patient is calling she is at the ED and she has been treated for allergic reaction to Lisinopril. She states she is doing much better now. She will need to discuss an alternative medication for treatment of her BP. Appointment scheduled.

## 2018-08-31 NOTE — Telephone Encounter (Signed)
Please advise 

## 2018-08-31 NOTE — ED Provider Notes (Signed)
[facility] EMERGENCY DEPARTMENT Provider Note   CSN: [id number] Arrival date & time: 08/31/18  1208     History   Chief Complaint Chief Complaint  Patient presents with  . Oral Swelling    HPI Melanie Alvarez is a 63 y.o. female.  HPI   63 year old female presents today with facial swelling.  Patient notes she got up around 5:30 AM and took her lisinopril.  She notes approximately 45 minutes later she developed swelling to the left side of her face, she notes that the left-sided facial swelling spread down to the left upper lip.  At the time my evaluation she notes swelling to the left upper lip only, denies any acute neurological deficits.  She denies any oral swelling edema, chest pain or shortness of breath.  No history of the same.  No abnormal exposures.  Occasions prior to arrival.   Past Medical History:  Diagnosis Date  . Diabetes mellitus 2012  . Hepatitis 2010   history of Hepatitis C  . Hypertension 2011  . Obesity     Patient Active Problem List   Diagnosis Date Noted  . Mixed hyperlipidemia 04/06/2018  . Cirrhosis of liver ([place]) 12/26/2017  . Need for prophylactic vaccination and inoculation against viral hepatitis 12/26/2017  . Gouty arthritis 10/20/2017  . Hypokalemia 10/20/2017  . Hypertension 08/13/2012  . DM (diabetes mellitus) ([place]) 08/13/2012  . Tobacco abuse 08/13/2012  . Chronic hepatitis C without hepatic coma ([place]) 08/13/2012  . Obesity, morbid, BMI 40.0-49.9 ([place]) 08/13/2012    Past Surgical History:  Procedure Laterality Date  . COLONOSCOPY  07/24/2011   Procedure: COLONOSCOPY;  Surgeon: Landry Dyke, MD;  Location: [facility] ENDOSCOPY;  Service: Endoscopy;  Laterality: N/A;  . Dental Surgery Tooth Extraction       OB History    Gravida  3   Para  1   Term  1   Preterm      AB  2   Living  1     SAB  2   TAB      Ectopic      Multiple      Live Births               Home Medications     Prior to Admission medications   Medication Sig Start Date End Date Taking? Authorizing Provider  albuterol (PROVENTIL HFA;VENTOLIN HFA) 108 (90 Base) MCG/ACT inhaler Inhale 1-2 puffs into the lungs every 6 (six) hours as needed. 10/17/17   Nche, Charlene Brooke, NP  allopurinol (ZYLOPRIM) 100 MG tablet Take 1 tablet (100 mg total) by mouth 2 (two) times daily. 09/22/17   Nche, Charlene Brooke, NP  amLODipine (NORVASC) 5 MG tablet TAKE 1 TABLET BY MOUTH AT BEDTIME 03/29/18   Nche, Charlene Brooke, NP  aspirin 81 MG chewable tablet Chew 81 mg by mouth daily.    [provider]  atorvastatin (LIPITOR) 20 MG tablet Take 1 tablet (20 mg total) by mouth daily at 6 PM. 04/06/18   Nche, Charlene Brooke, NP  blood glucose meter kit and supplies KIT Dispense based on patient and insurance preference. Use once a day (before breakfast). E11.9 11/05/17   Nche, Charlene Brooke, NP  cholecalciferol (VITAMIN D) 400 UNITS TABS tablet Take 400 Units by mouth daily.    [provider]  famotidine (PEPCID) 20 MG tablet Take 1 tablet (20 mg total) by mouth 2 (two) times daily. 08/31/18   Okey Regal, PA-C  KLOR-CON  M10 10 MEQ tablet  10/20/17   [provider]  lisinopril-hydrochlorothiazide (ZESTORETIC) 20-12.5 MG tablet Take 2 tablets by mouth daily. 04/03/18   Nche, Charlene Brooke, NP  metFORMIN (GLUCOPHAGE) 500 MG tablet Take 1 tablet (500 mg total) by mouth 2 (two) times daily with a meal. 04/03/18   Nche, Charlene Brooke, NP  potassium chloride (K-DUR) 10 MEQ tablet Take 1 tablet (10 mEq total) by mouth 2 (two) times daily after a meal. 04/03/18   Nche, Charlene Brooke, NP    Family History Family History  Problem Relation Age of Onset  . Diabetes Sister   . Cancer Maternal Grandmother        leukemia   . Diabetes Maternal Grandfather   . Diabetes Paternal Grandfather   . Diabetes Paternal Grandmother   . Hypertension Sister   . Hypertension Brother   . Anesthesia problems Neg Hx     Social  History Social History   Tobacco Use  . Smoking status: Current Some Day Smoker    Packs/day: 0.50    Types: Cigarettes  . Smokeless tobacco: Never Used  Substance Use Topics  . Alcohol use: Yes    Alcohol/week: 30.0 standard drinks    Types: 24 Cans of beer, 6 Shots of liquor per week    Comment: weekends  . Drug use: No    Comment: previously      Allergies   Patient has no known allergies.   Review of Systems Review of Systems  All other systems reviewed and are negative.    Physical Exam Updated Vital Signs BP (!) 172/119 (BP Location: Right Arm)   Pulse 88   Temp 98.4 F (36.9 C) (Oral)   Resp 17   SpO2 99%   Physical Exam Vitals signs and nursing note reviewed.  Constitutional:      Appearance: She is well-developed.  HENT:     Head: Normocephalic and atraumatic.     Comments: Minor edema to the left upper lip, no oral pharyngeal swelling or edema, voice is normal-no swelling to the remainder of the face Eyes:     General: No scleral icterus.       Right eye: No discharge.        Left eye: No discharge.     Conjunctiva/sclera: Conjunctivae normal.     Pupils: Pupils are equal, round, and reactive to light.  Neck:     Musculoskeletal: Normal range of motion.     Vascular: No JVD.     Trachea: No tracheal deviation.  Pulmonary:     Effort: Pulmonary effort is normal. No respiratory distress.     Breath sounds: No stridor. No wheezing, rhonchi or rales.  Chest:     Chest wall: No tenderness.  Neurological:     Mental Status: She is alert and oriented to person, place, and time.     Coordination: Coordination normal.  Psychiatric:        Behavior: Behavior normal.        Thought Content: Thought content normal.        Judgment: Judgment normal.      ED Treatments / Results  Labs (all labs ordered are listed, but only abnormal results are displayed) Labs Reviewed - No data to display  EKG None  Radiology No results  found.  Procedures Procedures (including critical care time)  Medications Ordered in ED Medications  famotidine (PEPCID) tablet 20 mg (20 mg Oral Given 08/31/18 1410)  acetaminophen (TYLENOL) tablet 650 mg (650  mg Oral Given 08/31/18 1447)     Initial Impression / Assessment and Plan / ED Course  I have reviewed the triage vital signs and the nursing notes.  Pertinent labs & imaging results that were available during my care of the patient were reviewed by me and considered in my medical decision making (see chart for details).     63 year old female presents today with angioedema.  This is very minor, he she was monitored here for approximately 3 hours with improvement in her symptoms.  Patient has no signs of airway compromise.  Patient did take lisinopril this morning, no other questionable exposures.  I instructed her to discontinue lisinopril, contact her primary care provider today to schedule follow-up evaluation for blood pressure management and ongoing evaluation.  She will return immediately she develops any new or worsening signs or symptoms.  She verbalized understanding and agreement to today's plan had no further questions or concerns. Final Clinical Impressions(s) / ED Diagnoses   Final diagnoses:  Angioedema, initial encounter    ED Discharge Orders         Ordered    famotidine (PEPCID) 20 MG tablet  2 times daily     08/31/18 1532           Okey Regal, PA-C 08/31/18 1553    Orlie Dakin, MD 08/31/18 [phone]

## 2018-08-31 NOTE — ED Triage Notes (Signed)
Pt in c/o upper lip swelling that she first noted this morning when she woke up, takes lisinopril, denies airway swelling or tongue swelling

## 2018-08-31 NOTE — ED Notes (Signed)
ED Provider at bedside. 

## 2018-08-31 NOTE — Telephone Encounter (Signed)
FYI

## 2018-08-31 NOTE — Telephone Encounter (Signed)
Pt is aware of message below. She will pick up rx tomorrow, she will see Baldo Ash for a ED follow up on 09/08/2018 and call back with BP reading on 09/03/2018.

## 2018-09-01 ENCOUNTER — Ambulatory Visit: Payer: Self-pay

## 2018-09-01 ENCOUNTER — Telehealth: Payer: Self-pay | Admitting: Nurse Practitioner

## 2018-09-01 ENCOUNTER — Ambulatory Visit: Payer: BC Managed Care – PPO | Admitting: Nurse Practitioner

## 2018-09-01 NOTE — Telephone Encounter (Signed)
Called the pt. Unable to leave VM. Need to speak to the pt.

## 2018-09-01 NOTE — Telephone Encounter (Unsigned)
Copied from [place] [id number]. Topic: Quick Communication - Rx Refill/Question >> Sep 01, 2018 10:04 AM Parke Poisson wrote: Medication: Hui call wanted to make sure that it is ok for patient to be taking triamterene-hydrochlorothiazide (MAXZIDE-25) 37.5-25 MG tablet  and potassium chloride SA (K-DUR,KLOR-CON) 10 MEQ tablet together    Preferred Pharmacy (with phone number or street name): [place], [place] [phone] (Phone) [phone] (Fax)    Agent: Please be advised that RX refills may take up to 3 business days. We ask that you follow-up with your pharmacy.

## 2018-09-01 NOTE — Telephone Encounter (Signed)
Left VM for the pt to let her know Rx is ready to be picked up.

## 2018-09-01 NOTE — Telephone Encounter (Signed)
Left vm on cell phone # for the pt to call back.

## 2018-09-01 NOTE — Telephone Encounter (Signed)
Incoming call from Patient Inquiring if she should start new Blood Pressure medication now Patient states she still has about 30% swelling   On the left side of face and mouth.  Patient is asking when is she suppose to start the Maxide 25mg  , Pepcid 20mg , and potassium?  Patient will be going to pick  up medication  This morning.  Patient awaits to hear from PCP.    Reason for Disposition . [1] Request for URGENT new prescription or refill of "essential" medication (i.e., likelihood of harm to patient if not taken) AND [2] triager unable to fill per unit policy  Answer Assessment - Initial Assessment Questions 1. SYMPTOMS: "Do you have any symptoms?"     Question of if Patient is suppose to start new blood pressure  Medication  Protocols used: MEDICATION QUESTION CALL-A-AH

## 2018-09-01 NOTE — Telephone Encounter (Signed)
Per note 08/31/18- C Nche is aware of Rx and patient to follow up in office Thursday. Pharmacy notified.

## 2018-09-08 ENCOUNTER — Ambulatory Visit: Payer: BC Managed Care – PPO | Admitting: Nurse Practitioner

## 2018-09-08 DIAGNOSIS — Z0289 Encounter for other administrative examinations: Secondary | ICD-10-CM

## 2018-09-24 ENCOUNTER — Telehealth: Payer: Self-pay | Admitting: Nurse Practitioner

## 2018-09-24 NOTE — Telephone Encounter (Signed)
Copied from [place] [id number]. Topic: Quick Communication - Rx Refill/Question >> Sep 24, 2018  3:11 PM Scherrie Gerlach wrote: Medication: potassium chloride SA (K-DUR,KLOR-CON) 10 MEQ tablet Pt states the pharmacy told her they have been trying to contact [place] to make sure this is the correct dosage of her potassium to take with her bp med. She would like someone to call the pharmacy and advise on this medication.  [facility], [place] [phone] (Phone) [phone] (Fax)    .

## 2018-09-24 NOTE — Telephone Encounter (Signed)
Potassium should be once a day

## 2018-09-24 NOTE — Telephone Encounter (Signed)
[place] please advise,   Last ov was 03/2018--due for 6 mo follow up with Nche on 10/04/2018. Directions given 03/2018 K+ t tab twice daily. But on 08/31/2018 direction change to 1 tab daily. (cant find note of reason to change. Please help.

## 2018-09-25 NOTE — Telephone Encounter (Signed)
Pt has appt with nche 10/05/2018. Hold K+ until see PCP.

## 2018-09-25 NOTE — Telephone Encounter (Signed)
Patient is calling and states she can not get this medication until the pharmacy is contacted.

## 2018-10-05 ENCOUNTER — Ambulatory Visit: Payer: BC Managed Care – PPO | Admitting: Nurse Practitioner

## 2018-10-05 ENCOUNTER — Encounter: Payer: Self-pay | Admitting: Nurse Practitioner

## 2018-10-05 VITALS — BP 180/96 | HR 86 | Temp 98.4°F | Ht 63.0 in | Wt 239.8 lb

## 2018-10-05 DIAGNOSIS — M109 Gout, unspecified: Secondary | ICD-10-CM | POA: Diagnosis not present

## 2018-10-05 DIAGNOSIS — E790 Hyperuricemia without signs of inflammatory arthritis and tophaceous disease: Secondary | ICD-10-CM

## 2018-10-05 DIAGNOSIS — E782 Mixed hyperlipidemia: Secondary | ICD-10-CM | POA: Diagnosis not present

## 2018-10-05 DIAGNOSIS — E876 Hypokalemia: Secondary | ICD-10-CM

## 2018-10-05 DIAGNOSIS — E119 Type 2 diabetes mellitus without complications: Secondary | ICD-10-CM

## 2018-10-05 DIAGNOSIS — I10 Essential (primary) hypertension: Secondary | ICD-10-CM

## 2018-10-05 LAB — BASIC METABOLIC PANEL
BUN: 19 mg/dL (ref 6–23)
CO2: 32 mEq/L (ref 19–32)
CREATININE: 0.85 mg/dL (ref 0.40–1.20)
Calcium: 9.4 mg/dL (ref 8.4–10.5)
Chloride: 98 mEq/L (ref 96–112)
GFR: 82.12 mL/min (ref >60.00)
Glucose, Bld: 165 mg/dL — ABNORMAL HIGH (ref 70–99)
POTASSIUM: 3.3 meq/L — AB (ref 3.5–5.1)
Sodium: 140 mEq/L (ref 135–145)

## 2018-10-05 LAB — URIC ACID: Uric Acid, Serum: 9.9 mg/dL — ABNORMAL HIGH (ref 2.4–7.0)

## 2018-10-05 LAB — MICROALBUMIN / CREATININE URINE RATIO
CREATININE, U: 76.6 mg/dL
MICROALB/CREAT RATIO: 1.5 mg/g (ref 0.0–30.0)
Microalb, Ur: 1.1 mg/dL (ref 0.0–1.9)

## 2018-10-05 LAB — HEMOGLOBIN A1C: Hgb A1c MFr Bld: 7.3 % — ABNORMAL HIGH (ref 4.6–6.5)

## 2018-10-05 MED ORDER — AMLODIPINE BESYLATE 10 MG PO TABS: 10.0000 mg | ORAL_TABLET | Freq: Every day | ORAL | 1 refills | Status: DC

## 2018-10-05 NOTE — Progress Notes (Signed)
Subjective:  Patient ID: Melanie Alvarez, female    DOB: February 27, 1956  Age: 63 y.o. MRN: [id number]  CC: Follow-up (follow up on BP and K+ level check today. no eye check in 4 yrs. )  HPI  DM: Does not check glucose at home. Needs referral to ophthalmology. Last hgbA1c of 6.8 Controlled DM with metformin. No neuropathy. No urine microalbumin.  HTN: Uncontrolled with amlodipine and maxzide. Needs repeat BMP. No headache, no dizziness, no edema, no chest pain. BP Readings from Last 3 Encounters:  10/05/18 (!) 180/96  08/31/18 (!) 172/119  07/27/18 (!) 147/85   Gout: No acute exacerbation since last OV. Out of allopurinol rx.  Reviewed past Medical, Social and Family history today.  Outpatient Medications Prior to Visit  Medication Sig Dispense Refill  . aspirin 81 MG chewable tablet Chew 81 mg by mouth daily.    . cholecalciferol (VITAMIN D) 400 UNITS TABS tablet Take 400 Units by mouth daily.    Marland Kitchen amLODipine (NORVASC) 5 MG tablet TAKE 1 TABLET BY MOUTH AT BEDTIME 90 tablet 1  . atorvastatin (LIPITOR) 20 MG tablet Take 1 tablet (20 mg total) by mouth daily at 6 PM. 90 tablet 1  . metFORMIN (GLUCOPHAGE) 500 MG tablet Take 1 tablet (500 mg total) by mouth 2 (two) times daily with a meal. 180 tablet 1  . triamterene-hydrochlorothiazide (MAXZIDE-25) 37.5-25 MG tablet Take 1 tablet by mouth daily. 30 tablet 1  . blood glucose meter kit and supplies KIT Dispense based on patient and insurance preference. Use once a day (before breakfast). E11.9 (Patient not taking: Reported on 10/05/2018) 1 each 3  . albuterol (PROVENTIL HFA;VENTOLIN HFA) 108 (90 Base) MCG/ACT inhaler Inhale 1-2 puffs into the lungs every 6 (six) hours as needed. (Patient not taking: Reported on 10/05/2018) 1 Inhaler 0  . allopurinol (ZYLOPRIM) 100 MG tablet Take 1 tablet (100 mg total) by mouth 2 (two) times daily. (Patient not taking: Reported on 10/05/2018) 180 tablet 1  . famotidine (PEPCID) 20 MG tablet Take 1  tablet (20 mg total) by mouth 2 (two) times daily. (Patient not taking: Reported on 10/05/2018) 30 tablet 0  . potassium chloride SA (K-DUR,KLOR-CON) 10 MEQ tablet Take 1 tablet (10 mEq total) by mouth daily. (Patient not taking: Reported on 10/05/2018) 30 tablet 1   No facility-administered medications prior to visit.     ROS See HPI  Objective:  BP (!) 180/96   Pulse 86   Temp 98.4 F (36.9 C) (Oral)   Ht _0  (1.6 m)   Wt 239 lb 12.8 oz (108.8 kg)   SpO2 96%   BMI 42.48 kg/m   BP Readings from Last 3 Encounters:  10/05/18 (!) 180/96  08/31/18 (!) 172/119  07/27/18 (!) 147/85    Wt Readings from Last 3 Encounters:  10/05/18 239 lb 12.8 oz (108.8 kg)  07/27/18 242 lb (109.8 kg)  04/03/18 243 lb 6.4 oz (110.4 kg)    Physical Exam Constitutional:      Appearance: She is obese.  Cardiovascular:     Rate and Rhythm: Regular rhythm.     Pulses: Normal pulses.     Heart sounds: Normal heart sounds.  Pulmonary:     Effort: Pulmonary effort is normal.     Breath sounds: Normal breath sounds.  Abdominal:     General: Bowel sounds are normal.     Palpations: Abdomen is soft.  Neurological:     Mental Status: She is alert and oriented to  person, place, and time.  Psychiatric:        Mood and Affect: Mood normal.        Behavior: Behavior normal.        Thought Content: Thought content normal.     Lab Results  Component Value Date   WBC 11.2 (H) 04/03/2018   HGB 13.9 04/03/2018   HCT 40.3 04/03/2018   PLT 365.0 04/03/2018   GLUCOSE 165 (H) 10/05/2018   CHOL 203 (H) 04/03/2018   TRIG 114.0 04/03/2018   HDL 42.30 04/03/2018   LDLDIRECT 110 (H) 09/30/2012   LDLCALC 138 (H) 04/03/2018   ALT 15 03/23/2018   AST 16 03/23/2018   NA 140 10/05/2018   K 3.3 (L) 10/05/2018   CL 98 10/05/2018   CREATININE 0.85 10/05/2018   BUN 19 10/05/2018   CO2 32 10/05/2018   TSH 1.959 03/19/2010   INR 1.0 11/25/2017   HGBA1C 7.3 (H) 10/05/2018   MICROALBUR 1.1 10/05/2018     Assessment & Plan:   Melanie Alvarez was seen today for follow-up.  Diagnoses and all orders for this visit:  Essential hypertension -     Basic metabolic panel -     amLODipine (NORVASC) 10 MG tablet; Take 1 tablet (10 mg total) by mouth at bedtime. -     potassium chloride (K-DUR,KLOR-CON) 10 MEQ tablet; Take 1 tablet (10 mEq total) by mouth daily. -     triamterene-hydrochlorothiazide (MAXZIDE-25) 37.5-25 MG tablet; Take 1 tablet by mouth daily.  Type 2 diabetes mellitus without complication, without long-term current use of insulin (HCC) -     Basic metabolic panel -     Hemoglobin A1c -     Microalbumin / creatinine urine ratio -     Ambulatory referral to Ophthalmology -     metFORMIN (GLUCOPHAGE) 500 MG tablet; Take 1 tablet (500 mg total) by mouth 2 (two) times daily with a meal.  Mixed hyperlipidemia -     atorvastatin (LIPITOR) 20 MG tablet; Take 1 tablet (20 mg total) by mouth daily at 6 PM.  Hypokalemia -     Basic metabolic panel  Gouty arthritis -     Basic metabolic panel -     Uric acid -     allopurinol (ZYLOPRIM) 100 MG tablet; Take 1 tablet (100 mg total) by mouth 2 (two) times daily.  Hyperuricemia   I have discontinued Melanie Alvarez's albuterol and famotidine. I have also changed her amLODipine and potassium chloride. Additionally, I am having her maintain her cholecalciferol, aspirin, blood glucose meter kit and supplies, allopurinol, atorvastatin, metFORMIN, and triamterene-hydrochlorothiazide.  Meds ordered this encounter  Medications  . amLODipine (NORVASC) 10 MG tablet    Sig: Take 1 tablet (10 mg total) by mouth at bedtime.    Dispense:  90 tablet    Refill:  1    Order Specific Question:   Supervising Provider    Answer:   Lucille Passy [[id number]]  . allopurinol (ZYLOPRIM) 100 MG tablet    Sig: Take 1 tablet (100 mg total) by mouth 2 (two) times daily.    Dispense:  180 tablet    Refill:  1    Order Specific Question:   Supervising Provider     Answer:   Lucille Passy [[id number]]  . atorvastatin (LIPITOR) 20 MG tablet    Sig: Take 1 tablet (20 mg total) by mouth daily at 6 PM.    Dispense:  90 tablet    Refill:  1    Order Specific Question:   Supervising Provider    Answer:   Lucille Passy [[id number]]  . metFORMIN (GLUCOPHAGE) 500 MG tablet    Sig: Take 1 tablet (500 mg total) by mouth 2 (two) times daily with a meal.    Dispense:  180 tablet    Refill:  1    Order Specific Question:   Supervising Provider    Answer:   Lucille Passy [[id number]]  . potassium chloride (K-DUR,KLOR-CON) 10 MEQ tablet    Sig: Take 1 tablet (10 mEq total) by mouth daily.    Dispense:  90 tablet    Refill:  0    Potassium level of 3.3    Order Specific Question:   Supervising Provider    Answer:   Lucille Passy [[id number]]  . triamterene-hydrochlorothiazide (MAXZIDE-25) 37.5-25 MG tablet    Sig: Take 1 tablet by mouth daily.    Dispense:  90 tablet    Refill:  1    Order Specific Question:   Supervising Provider    Answer:   Lucille Passy [[id number]]    Problem List Items Addressed This Visit      Cardiovascular and Mediastinum   Hypertension - Primary   Relevant Medications   amLODipine (NORVASC) 10 MG tablet   atorvastatin (LIPITOR) 20 MG tablet   potassium chloride (K-DUR,KLOR-CON) 10 MEQ tablet   triamterene-hydrochlorothiazide (MAXZIDE-25) 37.5-25 MG tablet   Other Relevant Orders   Basic metabolic panel (Completed)     Endocrine   DM (diabetes mellitus) (HCC)   Relevant Medications   atorvastatin (LIPITOR) 20 MG tablet   metFORMIN (GLUCOPHAGE) 500 MG tablet   Other Relevant Orders   Basic metabolic panel (Completed)   Hemoglobin A1c (Completed)   Microalbumin / creatinine urine ratio (Completed)   Ambulatory referral to Ophthalmology     Musculoskeletal and Integument   Gouty arthritis   Relevant Medications   allopurinol (ZYLOPRIM) 100 MG tablet   Other Relevant Orders   Basic metabolic panel (Completed)   Uric acid (Completed)     Other    Hyperuricemia   Hypokalemia   Relevant Orders   Basic metabolic panel (Completed)   Mixed hyperlipidemia   Relevant Medications   amLODipine (NORVASC) 10 MG tablet   atorvastatin (LIPITOR) 20 MG tablet   triamterene-hydrochlorothiazide (MAXZIDE-25) 37.5-25 MG tablet       Follow-up: Return in about 4 weeks (around 11/02/2018) for HTN.  Wilfred Lacy, NP

## 2018-10-05 NOTE — Patient Instructions (Addendum)
Increase amlodipine to 10mg  at bedtime.  Maintain DASH diet, and walking for exercise.  Please schedule appt with opthalmology when contacted.  Will send Maxzide prescription after review of lab results.  Check BP at home 2x/week. Return to office sooner if BP >150/80 after 1week.  Normal urine. No diabetic nephropathy. BMP: normal renal function with low potassium at 3.3. resume potassium supplement for next 34months. Elevated uric acid. Need to resume allopurinol. Increase in HgbA1c 6.8 to 7.3. continue metformin

## 2018-10-06 ENCOUNTER — Encounter: Payer: Self-pay | Admitting: Nurse Practitioner

## 2018-10-06 DIAGNOSIS — E790 Hyperuricemia without signs of inflammatory arthritis and tophaceous disease: Secondary | ICD-10-CM | POA: Insufficient documentation

## 2018-10-06 MED ORDER — POTASSIUM CHLORIDE CRYS ER 10 MEQ PO TBCR: 10.0000 meq | EXTENDED_RELEASE_TABLET | Freq: Every day | ORAL | 0 refills | Status: DC

## 2018-10-06 MED ORDER — ALLOPURINOL 100 MG PO TABS: 100.0000 mg | ORAL_TABLET | Freq: Two times a day (BID) | ORAL | 1 refills | Status: DC

## 2018-10-06 MED ORDER — ATORVASTATIN CALCIUM 20 MG PO TABS: 20.0000 mg | ORAL_TABLET | Freq: Every day | ORAL | 1 refills | Status: DC

## 2018-10-06 MED ORDER — METFORMIN HCL 500 MG PO TABS: 500.0000 mg | ORAL_TABLET | Freq: Two times a day (BID) | ORAL | 1 refills | Status: DC

## 2018-10-06 MED ORDER — TRIAMTERENE-HCTZ 37.5-25 MG PO TABS: 1.0000 | ORAL_TABLET | Freq: Every day | ORAL | 1 refills | Status: DC

## 2018-10-08 ENCOUNTER — Ambulatory Visit: Payer: Self-pay | Admitting: Nurse Practitioner

## 2018-10-08 ENCOUNTER — Encounter: Payer: Self-pay | Admitting: Nurse Practitioner

## 2018-10-08 NOTE — Telephone Encounter (Signed)
[place] called and spoke to [place], [employer] and asked did they receive the refill of Allopurinol sent on 10/06/18 #180/1 refill, she says yes and it is ready for patient pickup today. I asked if she will reach out to the patient to let her know, because she called and says she is out and need the refill, she says she will call the patient.  Message from Bea Graff, [place] sent at 10/08/2018 12:20 PM EST   Summary: gout   Pt states she would like to see if gout medication can be called in? She states she has a gout glare up and the doctor advised her to take it but she doesn't have any at home.

## 2018-10-13 ENCOUNTER — Encounter: Payer: Self-pay | Admitting: Nurse Practitioner

## 2018-10-13 ENCOUNTER — Ambulatory Visit: Payer: BC Managed Care – PPO | Admitting: Nurse Practitioner

## 2018-10-13 ENCOUNTER — Ambulatory Visit: Payer: Self-pay | Admitting: Nurse Practitioner

## 2018-10-13 VITALS — BP 158/90 | HR 90 | Temp 98.0°F | Ht 63.0 in | Wt 240.0 lb

## 2018-10-13 DIAGNOSIS — I10 Essential (primary) hypertension: Secondary | ICD-10-CM

## 2018-10-13 MED ORDER — METOPROLOL SUCCINATE ER 25 MG PO TB24: 25.0000 mg | ORAL_TABLET | Freq: Every day | ORAL | 0 refills | Status: DC

## 2018-10-13 NOTE — Patient Instructions (Signed)
Start metoprolol this morning. Continue other medications as prescribed. F/up in 1week.  Maintain adequate oral hydration and low sodium diet

## 2018-10-13 NOTE — Progress Notes (Signed)
Subjective:  Patient ID: Melanie Alvarez, female    DOB: 1956-06-15  Age: 63 y.o. MRN: [id number]  CC: Follow-up (pt is c/o of high BP/187/148 and 187/90-today. )  Hypertension  This is a chronic problem. The current episode started more than 1 year ago. The problem has been waxing and waning since onset. The problem is uncontrolled. Associated symptoms include blurred vision. Pertinent negatives include no anxiety, chest pain, malaise/fatigue, neck pain, orthopnea, palpitations, peripheral edema, PND, shortness of breath or sweats. There are no associated agents to hypertension. Risk factors for coronary artery disease include diabetes mellitus, dyslipidemia, family history, obesity, post-menopausal state and sedentary lifestyle. Past treatments include calcium channel blockers and diuretics. The current treatment provides mild improvement. Compliance problems include diet.  There is no history of kidney disease, PVD or retinopathy. There is no history of chronic renal disease, a hypertension causing med or a thyroid problem.  hx of angioedema with lisinopril Reports blurry vision and headache have resolved today. Admits to eating high sodium meal over the weekend.  Reviewed past Medical, Social and Family history today.  Outpatient Medications Prior to Visit  Medication Sig Dispense Refill  . allopurinol (ZYLOPRIM) 100 MG tablet Take 1 tablet (100 mg total) by mouth 2 (two) times daily. 180 tablet 1  . amLODipine (NORVASC) 10 MG tablet Take 1 tablet (10 mg total) by mouth at bedtime. 90 tablet 1  . aspirin 81 MG chewable tablet Chew 81 mg by mouth daily.    Marland Kitchen atorvastatin (LIPITOR) 20 MG tablet Take 1 tablet (20 mg total) by mouth daily at 6 PM. 90 tablet 1  . cholecalciferol (VITAMIN D) 400 UNITS TABS tablet Take 400 Units by mouth daily.    . metFORMIN (GLUCOPHAGE) 500 MG tablet Take 1 tablet (500 mg total) by mouth 2 (two) times daily with a meal. 180 tablet 1  . potassium chloride  (K-DUR,KLOR-CON) 10 MEQ tablet Take 1 tablet (10 mEq total) by mouth daily. 90 tablet 0  . triamterene-hydrochlorothiazide (MAXZIDE-25) 37.5-25 MG tablet Take 1 tablet by mouth daily. 90 tablet 1  . blood glucose meter kit and supplies KIT Dispense based on patient and insurance preference. Use once a day (before breakfast). E11.9 (Patient not taking: Reported on 10/05/2018) 1 each 3   No facility-administered medications prior to visit.     ROS See HPI  Objective:  BP (!) 158/90   Pulse 90   Temp 98 F (36.7 C) (Oral)   Ht '5\' 3"'  (1.6 m)   Wt 240 lb (108.9 kg)   SpO2 96%   BMI 42.51 kg/m   BP Readings from Last 3 Encounters:  10/13/18 (!) 158/90  10/05/18 (!) 180/96  08/31/18 (!) 172/119    Wt Readings from Last 3 Encounters:  10/13/18 240 lb (108.9 kg)  10/05/18 239 lb 12.8 oz (108.8 kg)  07/27/18 242 lb (109.8 kg)    Physical Exam Vitals signs reviewed.  Cardiovascular:     Rate and Rhythm: Normal rate and regular rhythm.     Pulses: Normal pulses.     Heart sounds: Normal heart sounds.  Pulmonary:     Effort: Pulmonary effort is normal.     Breath sounds: Normal breath sounds.  Musculoskeletal:     Right lower leg: No edema.     Left lower leg: No edema.  Neurological:     Mental Status: She is alert and oriented to person, place, and time.  Psychiatric:  Mood and Affect: Mood normal.        Behavior: Behavior normal.    Lab Results  Component Value Date   WBC 11.2 (H) 04/03/2018   HGB 13.9 04/03/2018   HCT 40.3 04/03/2018   PLT 365.0 04/03/2018   GLUCOSE 165 (H) 10/05/2018   CHOL 203 (H) 04/03/2018   TRIG 114.0 04/03/2018   HDL 42.30 04/03/2018   LDLDIRECT 110 (H) 09/30/2012   LDLCALC 138 (H) 04/03/2018   ALT 15 03/23/2018   AST 16 03/23/2018   NA 140 10/05/2018   K 3.3 (L) 10/05/2018   CL 98 10/05/2018   CREATININE 0.85 10/05/2018   BUN 19 10/05/2018   CO2 32 10/05/2018   TSH 1.959 03/19/2010   INR 1.0 11/25/2017   HGBA1C 7.3 (H)  10/05/2018   MICROALBUR 1.1 10/05/2018    Assessment & Plan:   Iver was seen today for follow-up.  Diagnoses and all orders for this visit:  Essential hypertension -     metoprolol succinate (TOPROL-XL) 25 MG 24 hr tablet; Take 1 tablet (25 mg total) by mouth daily. Take with or immediately following a meal.   I have discontinued Aanvi L. Woolf's blood glucose meter kit and supplies. I am also having her start on metoprolol succinate. Additionally, I am having her maintain her cholecalciferol, aspirin, amLODipine, allopurinol, atorvastatin, metFORMIN, potassium chloride, and triamterene-hydrochlorothiazide.  Meds ordered this encounter  Medications  . metoprolol succinate (TOPROL-XL) 25 MG 24 hr tablet    Sig: Take 1 tablet (25 mg total) by mouth daily. Take with or immediately following a meal.    Dispense:  30 tablet    Refill:  0    Order Specific Question:   Supervising Provider    Answer:   MATTHEWS, CODY [[id number]]    Problem List Items Addressed This Visit      Cardiovascular and Mediastinum   Hypertension - Primary   Relevant Medications   metoprolol succinate (TOPROL-XL) 25 MG 24 hr tablet       Follow-up: Return in about 1 week (around 10/20/2018) for HTN.  Wilfred Lacy, NP

## 2018-10-13 NOTE — Telephone Encounter (Signed)
She called in concerned about her BP being elevated.  It was elevated yesterday also and she is having headaches.   She was started on a new BP medication a month ago. See triage notes.  I made her an appt with Wilfred Lacy, NP for this morning at 8:00.   She said she could be at the office by 8:00.    Reason for Disposition . [1] Taking BP medications AND [2] feels is having side effects (e.g., impotence, cough, dizzy upon standing)  Answer Assessment - Initial Assessment Questions 1. BLOOD PRESSURE: "What is the blood pressure?" "Did you take at least two measurements 5 minutes apart?"     186/90 now.  I did not go to work yesterday and I'm not going in today. 2. ONSET: "When did you take your blood pressure?"     5:30am.    My glucose 67 3. HOW: "How did you obtain the blood pressure?" (e.g., visiting nurse, automatic home BP monitor)     My sister  Came over and let me use hers. 4. HISTORY: "Do you have a history of high blood pressure?"     Off and on. 5. MEDICATIONS: "Are you taking any medications for blood pressure?" "Have you missed any doses recently?"     I'm on 2 BP medications now. 6. OTHER SYMPTOMS: "Do you have any symptoms?" (e.g., headache, chest pain, blurred vision, difficulty breathing, weakness)     Headache bad this morning.   It's better now.  My vision was blurry this morning.   I took a cold shower and drank some vinegar. 7. PREGNANCY: "Is there any chance you are pregnant?" "When was your last menstrual period?"     Not asked due to age  Protocols used: [place]

## 2018-10-20 ENCOUNTER — Ambulatory Visit: Payer: BC Managed Care – PPO | Admitting: Nurse Practitioner

## 2018-10-22 ENCOUNTER — Encounter: Payer: Self-pay | Admitting: Nurse Practitioner

## 2018-10-22 ENCOUNTER — Ambulatory Visit: Payer: BC Managed Care – PPO | Admitting: Nurse Practitioner

## 2018-10-22 VITALS — BP 136/80 | HR 92 | Temp 98.4°F | Ht 63.0 in | Wt 241.8 lb

## 2018-10-22 DIAGNOSIS — R350 Frequency of micturition: Secondary | ICD-10-CM | POA: Diagnosis not present

## 2018-10-22 DIAGNOSIS — I10 Essential (primary) hypertension: Secondary | ICD-10-CM

## 2018-10-22 LAB — POCT URINALYSIS DIPSTICK
Bilirubin, UA: NEGATIVE
Blood, UA: NEGATIVE
Glucose, UA: NEGATIVE
KETONES UA: NEGATIVE
Leukocytes, UA: NEGATIVE
NITRITE UA: NEGATIVE
Protein, UA: POSITIVE — AB
Spec Grav, UA: 1.025 (ref 1.010–1.025)
Urobilinogen, UA: 0.2 E.U./dL
pH, UA: 5.5 (ref 5.0–8.0)

## 2018-10-22 MED ORDER — METOPROLOL SUCCINATE ER 25 MG PO TB24: 25.0000 mg | ORAL_TABLET | Freq: Every day | ORAL | 1 refills | Status: DC

## 2018-10-22 NOTE — Progress Notes (Signed)
Subjective:  Patient ID: Melanie Alvarez, female    DOB: Jan 15, 1956  Age: 63 y.o. MRN: [id number]  CC: Follow-up (1 wk f/u. )  Urinary Frequency   This is a recurrent problem. The current episode started more than 1 month ago. The problem occurs every urination. The problem has been unchanged. The pain is at a severity of 0/10. The patient is experiencing no pain. There has been no fever. She is not sexually active. There is no history of pyelonephritis. Associated symptoms include frequency. Pertinent negatives include no chills, discharge, flank pain, hematuria, hesitancy, nausea, possible pregnancy, sweats, urgency or vomiting. She has tried nothing for the symptoms.   HTN: Improved with maxzide, metoprolol, and amlodipine. No headache, no dizziness. BP Readings from Last 3 Encounters:  10/22/18 136/80  10/13/18 (!) 158/90  10/05/18 (!) 180/96   Reviewed past Medical, Social and Family history today.  Outpatient Medications Prior to Visit  Medication Sig Dispense Refill  . allopurinol (ZYLOPRIM) 100 MG tablet Take 1 tablet (100 mg total) by mouth 2 (two) times daily. 180 tablet 1  . amLODipine (NORVASC) 10 MG tablet Take 1 tablet (10 mg total) by mouth at bedtime. 90 tablet 1  . aspirin 81 MG chewable tablet Chew 81 mg by mouth daily.    Marland Kitchen atorvastatin (LIPITOR) 20 MG tablet Take 1 tablet (20 mg total) by mouth daily at 6 PM. 90 tablet 1  . cholecalciferol (VITAMIN D) 400 UNITS TABS tablet Take 400 Units by mouth daily.    . metFORMIN (GLUCOPHAGE) 500 MG tablet Take 1 tablet (500 mg total) by mouth 2 (two) times daily with a meal. 180 tablet 1  . potassium chloride (K-DUR,KLOR-CON) 10 MEQ tablet Take 1 tablet (10 mEq total) by mouth daily. 90 tablet 0  . triamterene-hydrochlorothiazide (MAXZIDE-25) 37.5-25 MG tablet Take 1 tablet by mouth daily. 90 tablet 1  . metoprolol succinate (TOPROL-XL) 25 MG 24 hr tablet Take 1 tablet (25 mg total) by mouth daily. Take with or immediately  following a meal. 30 tablet 0   No facility-administered medications prior to visit.     ROS See HPI  Objective:  BP 136/80   Pulse 92   Temp 98.4 F (36.9 C) (Oral)   Ht 5\' 3"  (1.6 m)   Wt 241 lb 12.8 oz (109.7 kg)   SpO2 97%   BMI 42.83 kg/m   BP Readings from Last 3 Encounters:  10/22/18 136/80  10/13/18 (!) 158/90  10/05/18 (!) 180/96    Wt Readings from Last 3 Encounters:  10/22/18 241 lb 12.8 oz (109.7 kg)  10/13/18 240 lb (108.9 kg)  10/05/18 239 lb 12.8 oz (108.8 kg)    Physical Exam Vitals signs reviewed.  Cardiovascular:     Rate and Rhythm: Normal rate and regular rhythm.     Pulses: Normal pulses.     Heart sounds: Normal heart sounds.  Pulmonary:     Effort: Pulmonary effort is normal.     Breath sounds: Normal breath sounds.  Musculoskeletal:     Right lower leg: Edema present.     Left lower leg: Edema present.  Neurological:     Mental Status: She is alert and oriented to person, place, and time.    Lab Results  Component Value Date   WBC 11.2 (H) 04/03/2018   HGB 13.9 04/03/2018   HCT 40.3 04/03/2018   PLT 365.0 04/03/2018   GLUCOSE 165 (H) 10/05/2018   CHOL 203 (H) 04/03/2018   TRIG  114.0 04/03/2018   HDL 42.30 04/03/2018   LDLDIRECT 110 (H) 09/30/2012   LDLCALC 138 (H) 04/03/2018   ALT 15 03/23/2018   AST 16 03/23/2018   NA 140 10/05/2018   K 3.3 (L) 10/05/2018   CL 98 10/05/2018   CREATININE 0.85 10/05/2018   BUN 19 10/05/2018   CO2 32 10/05/2018   TSH 1.959 03/19/2010   INR 1.0 11/25/2017   HGBA1C 7.3 (H) 10/05/2018   MICROALBUR 1.1 10/05/2018    Assessment & Plan:   Tajai was seen today for follow-up.  Diagnoses and all orders for this visit:  Essential hypertension -     metoprolol succinate (TOPROL-XL) 25 MG 24 hr tablet; Take 1 tablet (25 mg total) by mouth daily. Take with or immediately following a meal.  Urinary frequency -     POCT urinalysis dipstick   I am having Mikalah L. Arabie maintain her  cholecalciferol, aspirin, amLODipine, allopurinol, atorvastatin, metFORMIN, potassium chloride, triamterene-hydrochlorothiazide, and metoprolol succinate.  Meds ordered this encounter  Medications  . metoprolol succinate (TOPROL-XL) 25 MG 24 hr tablet    Sig: Take 1 tablet (25 mg total) by mouth daily. Take with or immediately following a meal.    Dispense:  90 tablet    Refill:  1    Order Specific Question:   Supervising Provider    Answer:   Lucille Passy [[id number]]    Problem List Items Addressed This Visit      Cardiovascular and Mediastinum   Hypertension - Primary   Relevant Medications   metoprolol succinate (TOPROL-XL) 25 MG 24 hr tablet    Other Visit Diagnoses    Urinary frequency       Relevant Orders   POCT urinalysis dipstick (Completed)       Follow-up: Return in about 2 months (around 12/22/2018) for CPE (fasting).  Wilfred Lacy, NP

## 2018-10-22 NOTE — Patient Instructions (Signed)
Continue current medications.  Wear compression stocking during the day and off at night (to help with LE edema).  Maintain DASH diet.   DASH Eating Plan DASH stands for "Dietary Approaches to Stop Hypertension." The DASH eating plan is a healthy eating plan that has been shown to reduce high blood pressure (hypertension). It may also reduce your risk for type 2 diabetes, heart disease, and stroke. The DASH eating plan may also help with weight loss. What are tips for following this plan?  General guidelines  Avoid eating more than 2,300 mg (milligrams) of salt (sodium) a day. If you have hypertension, you may need to reduce your sodium intake to 1,500 mg a day.  Limit alcohol intake to no more than 1 drink a day for nonpregnant women and 2 drinks a day for men. One drink equals 12 oz of beer, 5 oz of wine, or 1 oz of hard liquor.  Work with your health care provider to maintain a healthy body weight or to lose weight. Ask what an ideal weight is for you.  Get at least 30 minutes of exercise that causes your heart to beat faster (aerobic exercise) most days of the week. Activities may include walking, swimming, or biking.  Work with your health care provider or diet and nutrition specialist (dietitian) to adjust your eating plan to your individual calorie needs. Reading food labels   Check food labels for the amount of sodium per serving. Choose foods with less than 5 percent of the Daily Value of sodium. Generally, foods with less than 300 mg of sodium per serving fit into this eating plan.  To find whole grains, look for the word "whole" as the first word in the ingredient list. Shopping  Buy products labeled as "low-sodium" or "no salt added."  Buy fresh foods. Avoid canned foods and premade or frozen meals. Cooking  Avoid adding salt when cooking. Use salt-free seasonings or herbs instead of table salt or sea salt. Check with your health care provider or pharmacist before  using salt substitutes.  Do not fry foods. Cook foods using healthy methods such as baking, boiling, grilling, and broiling instead.  Cook with heart-healthy oils, such as olive, canola, soybean, or sunflower oil. Meal planning  Eat a balanced diet that includes: ? 5 or more servings of fruits and vegetables each day. At each meal, try to fill half of your plate with fruits and vegetables. ? Up to 6-8 servings of whole grains each day. ? Less than 6 oz of lean meat, poultry, or fish each day. A 3-oz serving of meat is about the same size as a deck of cards. One egg equals 1 oz. ? 2 servings of low-fat dairy each day. ? A serving of nuts, seeds, or beans 5 times each week. ? Heart-healthy fats. Healthy fats called Omega-3 fatty acids are found in foods such as flaxseeds and coldwater fish, like sardines, salmon, and mackerel.  Limit how much you eat of the following: ? Canned or prepackaged foods. ? Food that is high in trans fat, such as fried foods. ? Food that is high in saturated fat, such as fatty meat. ? Sweets, desserts, sugary drinks, and other foods with added sugar. ? Full-fat dairy products.  Do not salt foods before eating.  Try to eat at least 2 vegetarian meals each week.  Eat more home-cooked food and less restaurant, buffet, and fast food.  When eating at a restaurant, ask that your food be prepared with  less salt or no salt, if possible. What foods are recommended? The items listed may not be a complete list. Talk with your dietitian about what dietary choices are best for you. Grains Whole-grain or whole-wheat bread. Whole-grain or whole-wheat pasta. Brown rice. [place]. Bulgur. Whole-grain and low-sodium cereals. Pita bread. Low-fat, low-sodium crackers. Whole-wheat flour tortillas. Vegetables Fresh or frozen vegetables (raw, steamed, roasted, or grilled). Low-sodium or reduced-sodium tomato and vegetable juice. Low-sodium or reduced-sodium tomato sauce and  tomato paste. Low-sodium or reduced-sodium canned vegetables. Fruits All fresh, dried, or frozen fruit. Canned fruit in natural juice (without added sugar). Meat and other protein foods Skinless chicken or [place]. Ground chicken or [place]. Pork with fat trimmed off. Fish and seafood. Egg whites. Dried beans, peas, or lentils. Unsalted nuts, nut butters, and seeds. Unsalted canned beans. Lean cuts of beef with fat trimmed off. Low-sodium, lean deli meat. Dairy Low-fat (1%) or fat-free (skim) milk. Fat-free, low-fat, or reduced-fat cheeses. Nonfat, low-sodium ricotta or cottage cheese. Low-fat or nonfat yogurt. Low-fat, low-sodium cheese. Fats and oils Soft margarine without trans fats. Vegetable oil. Low-fat, reduced-fat, or light mayonnaise and salad dressings (reduced-sodium). Canola, safflower, olive, soybean, and sunflower oils. Avocado. Seasoning and other foods Herbs. Spices. Seasoning mixes without salt. Unsalted popcorn and pretzels. Fat-free sweets. What foods are not recommended? The items listed may not be a complete list. Talk with your dietitian about what dietary choices are best for you. Grains Baked goods made with fat, such as croissants, muffins, or some breads. Dry pasta or rice meal packs. Vegetables Creamed or fried vegetables. Vegetables in a cheese sauce. Regular canned vegetables (not low-sodium or reduced-sodium). Regular canned tomato sauce and paste (not low-sodium or reduced-sodium). Regular tomato and vegetable juice (not low-sodium or reduced-sodium). Angie Fava. Olives. Fruits Canned fruit in a light or heavy syrup. Fried fruit. Fruit in cream or butter sauce. Meat and other protein foods Fatty cuts of meat. Ribs. Fried meat. Berniece Salines. Sausage. Bologna and other processed lunch meats. Salami. Fatback. Hotdogs. Bratwurst. Salted nuts and seeds. Canned beans with added salt. Canned or smoked fish. Whole eggs or egg yolks. Chicken or [place] with skin. Dairy Whole or 2%  milk, cream, and half-and-half. Whole or full-fat cream cheese. Whole-fat or sweetened yogurt. Full-fat cheese. Nondairy creamers. Whipped toppings. Processed cheese and cheese spreads. Fats and oils Butter. Stick margarine. Lard. Shortening. Ghee. Bacon fat. Tropical oils, such as coconut, palm kernel, or palm oil. Seasoning and other foods Salted popcorn and pretzels. Onion salt, garlic salt, seasoned salt, table salt, and sea salt. Worcestershire sauce. Tartar sauce. Barbecue sauce. Teriyaki sauce. Soy sauce, including reduced-sodium. Steak sauce. Canned and packaged gravies. Fish sauce. Oyster sauce. Cocktail sauce. Horseradish that you find on the shelf. Ketchup. Mustard. Meat flavorings and tenderizers. Bouillon cubes. Hot sauce and Tabasco sauce. Premade or packaged marinades. Premade or packaged taco seasonings. Relishes. Regular salad dressings. Where to find more information:  National Heart, Lung, and [place]: https://wilson-eaton.com/  American Heart Association: www.heart.org Summary  The DASH eating plan is a healthy eating plan that has been shown to reduce high blood pressure (hypertension). It may also reduce your risk for type 2 diabetes, heart disease, and stroke.  With the DASH eating plan, you should limit salt (sodium) intake to 2,300 mg a day. If you have hypertension, you may need to reduce your sodium intake to 1,500 mg a day.  When on the DASH eating plan, aim to eat more fresh fruits and vegetables, whole grains, lean proteins,  low-fat dairy, and heart-healthy fats.  Work with your health care provider or diet and nutrition specialist (dietitian) to adjust your eating plan to your individual calorie needs. This information is not intended to replace advice given to you by your health care provider. Make sure you discuss any questions you have with your health care provider. Document Released: 07/25/2011 Document Revised: 07/29/2016 Document Reviewed:  07/29/2016 Elsevier Interactive Patient Education  2019 [employer].

## 2018-10-23 ENCOUNTER — Other Ambulatory Visit: Payer: Self-pay | Admitting: Nurse Practitioner

## 2018-10-23 NOTE — Telephone Encounter (Signed)
Pt is aware to check with pharmacy. We just in new rx 10/06/2018 for 6 mo supply.

## 2018-10-23 NOTE — Telephone Encounter (Signed)
Copied from [place] [id number]. Topic: Quick Communication - Rx Refill/Question >> Oct 23, 2018  4:14 PM Reyne Dumas L wrote: Medication: triamterene-hydrochlorothiazide (MAXZIDE-25) 37.5-25 MG tablet  Has the patient contacted their pharmacy? Yes - states no refills left.  Pt states she is completely out of medication. (Agent: If no, request that the patient contact the pharmacy for the refill.) (Agent: If yes, when and what did the pharmacy advise?)  Preferred Pharmacy (with phone number or street name): [facility], [place] [phone] (Phone) [phone] (Fax)  Agent: Please be advised that RX refills may take up to 3 business days. We ask that you follow-up with your pharmacy.

## 2018-11-03 ENCOUNTER — Ambulatory Visit: Payer: BC Managed Care – PPO | Admitting: Nurse Practitioner

## 2018-11-04 ENCOUNTER — Telehealth: Payer: Self-pay | Admitting: Nurse Practitioner

## 2018-11-04 NOTE — Telephone Encounter (Signed)
Charlotte please help. Pt only wants to speak to you.

## 2018-11-04 NOTE — Telephone Encounter (Signed)
PN-Plz see message below/states only wants to talk to CN and declined to speak to an RN/thx dmf

## 2018-11-04 NOTE — Telephone Encounter (Signed)
Copied from [place] [phone]. Topic: Quick Communication - See Telephone Encounter >> Nov 04, 2018 10:32 AM Andria Frames L wrote: Pt called and stated that she is having flu like symptoms and only wants to talk to [place]. Suggested pt talk to a nurse and she declined. Please advise

## 2018-11-04 NOTE — Telephone Encounter (Signed)
Just called and no answer, left VM to call back. Since I am with other patients, Please advise her to talk to clinical staff when she calls back.

## 2018-11-05 ENCOUNTER — Telehealth: Payer: Self-pay | Admitting: Nurse Practitioner

## 2018-11-05 NOTE — Telephone Encounter (Signed)
Pt has an appt tomorrow with [facility].

## 2018-11-05 NOTE — Telephone Encounter (Signed)
Admin Team please help call the pt and inform her about the side door.

## 2018-11-05 NOTE — Telephone Encounter (Signed)
Questions for Screening COVID-19  Symptom onset: 2 days  Travel or Contacts: No  During this illness, did/does the patient experience any of the following symptoms? Fever >100.90F []   Yes [x]   No []   Unknown Subjective fever (felt feverish) []   Yes [x]   No []   Unknown Chills []   Yes [x]   No []   Unknown Muscle aches (myalgia) [x]   Yes []   No []   Unknown Runny nose (rhinorrhea) [x]   Yes []   No []   Unknown Sore throat []   Yes [x]   No []   Unknown Cough (new onset or worsening of chronic cough) [x]   Yes []   No []   Unknown Shortness of breath (dyspnea) []   Yes [x]   No []   Unknown Nausea or vomiting []   Yes [x]   No []   Unknown Headache [x]   Yes []   No []   Unknown Abdominal pain  []   Yes [x]   No []   Unknown Diarrhea (?3 loose/looser than normal stools/24hr period) [x]   Yes []   No []   Unknown Other, specify: pt report watery stool/soft. Denied other symptoms.   Patient risk factors: Smoker? [x]   Current []   Former []   Never If female, currently pregnant? []   Yes [x]   No  Patient Active Problem List   Diagnosis Date Noted  . Hyperuricemia 10/06/2018  . Mixed hyperlipidemia 04/06/2018  . Cirrhosis of liver ([place]) 12/26/2017  . Need for prophylactic vaccination and inoculation against viral hepatitis 12/26/2017  . Gouty arthritis 10/20/2017  . Hypokalemia 10/20/2017  . Hypertension 08/13/2012  . DM (diabetes mellitus) ([place]) 08/13/2012  . Tobacco abuse 08/13/2012  . Chronic hepatitis C without hepatic coma ([place]) 08/13/2012  . Obesity, morbid, BMI 40.0-49.9 ([place]) 08/13/2012    Plan:  []   High risk for COVID-19 with red flags go to ED (with CP, SOB, weak/lightheaded, or fever > 101.5). Call ahead.  []   High risk for COVID-19 but stable will have car visit. Inform provider and coordinate time. Will be completed in afternoon. []   No red flags but URI signs or symptoms will go through side door and be seen in dedicated room.  Note: Referral to telemedicine is an appropriate alternative  disposition for higher risk but stable. [facility]/e-Visit: [phone].

## 2018-11-05 NOTE — Telephone Encounter (Signed)
Patient aware and said she will come to the side door

## 2018-11-05 NOTE — Telephone Encounter (Signed)
Ok to see in office. Enter from side door with mask on

## 2018-11-06 ENCOUNTER — Encounter: Payer: Self-pay | Admitting: Nurse Practitioner

## 2018-11-06 ENCOUNTER — Other Ambulatory Visit: Payer: Self-pay

## 2018-11-06 ENCOUNTER — Ambulatory Visit (INDEPENDENT_AMBULATORY_CARE_PROVIDER_SITE_OTHER): Payer: BC Managed Care – PPO | Admitting: Nurse Practitioner

## 2018-11-06 VITALS — BP 154/92 | HR 104 | Temp 97.9°F | Ht 63.0 in | Wt 241.0 lb

## 2018-11-06 DIAGNOSIS — R6889 Other general symptoms and signs: Secondary | ICD-10-CM

## 2018-11-06 DIAGNOSIS — K529 Noninfective gastroenteritis and colitis, unspecified: Secondary | ICD-10-CM | POA: Diagnosis not present

## 2018-11-06 LAB — POC INFLUENZA A&B (BINAX/QUICKVUE)
Influenza A, POC: NEGATIVE
Influenza B, POC: NEGATIVE

## 2018-11-06 MED ORDER — BENZONATATE 100 MG PO CAPS: 100.0000 mg | ORAL_CAPSULE | Freq: Three times a day (TID) | ORAL | 0 refills | Status: DC | PRN

## 2018-11-06 MED ORDER — BISMUTH SUBSALICYLATE 262 MG/15ML PO SUSP: 30.0000 mL | Freq: Four times a day (QID) | ORAL | 0 refills | Status: DC | PRN

## 2018-11-06 MED ORDER — GUAIFENESIN-DM 100-10 MG/5ML PO SYRP: 5.0000 mL | ORAL_SOLUTION | ORAL | 0 refills | Status: DC | PRN

## 2018-11-06 MED ORDER — OSELTAMIVIR PHOSPHATE 75 MG PO CAPS: 75.0000 mg | ORAL_CAPSULE | Freq: Two times a day (BID) | ORAL | 0 refills | Status: DC

## 2018-11-06 MED ORDER — ALBUTEROL SULFATE HFA 108 (90 BASE) MCG/ACT IN AERS: 1.0000 | INHALATION_SPRAY | Freq: Four times a day (QID) | RESPIRATORY_TRACT | 0 refills | Status: DC | PRN

## 2018-11-06 MED ORDER — ONDANSETRON HCL 4 MG PO TABS: 4.0000 mg | ORAL_TABLET | Freq: Three times a day (TID) | ORAL | 0 refills | Status: DC | PRN

## 2018-11-06 NOTE — Patient Instructions (Signed)
Use tylenol as needed for fever and body aches. Call office if no improvement in 5days. Push oral hydration with water and gartorade  Viral Gastroenteritis, Adult  Viral gastroenteritis is also known as the stomach flu. This condition is caused by certain germs (viruses). These germs can be passed from person to person very easily (are very contagious). This condition can cause sudden watery poop (diarrhea), fever, and throwing up (vomiting). Having watery poop and throwing up can make you feel weak and cause you to get dehydrated. Dehydration can make you tired and thirsty, make you have a dry mouth, and make it so you pee (urinate) less often. Older adults and people with other diseases or a weak defense system (immune system) are at higher risk for dehydration. It is important to replace the fluids that you lose from having watery poop and throwing up. Follow these instructions at home: Follow instructions from your doctor about how to care for yourself at home. Eating and drinking Follow these instructions as told by your doctor:  Take an oral rehydration solution (ORS). This is a drink that is sold at pharmacies and stores.  Drink clear fluids in small amounts as you are able, such as: ? Water. ? Ice chips. ? Diluted fruit juice. ? Low-calorie sports drinks.  Eat bland, easy-to-digest foods in small amounts as you are able, such as: ? Bananas. ? Applesauce. ? Rice. ? Low-fat (lean) meats. ? Toast. ? Crackers.  Avoid fluids that have a lot of sugar or caffeine in them.  Avoid alcohol.  Avoid spicy or fatty foods. General instructions   Drink enough fluid to keep your pee (urine) clear or pale yellow.  Wash your hands often. If you cannot use soap and water, use hand sanitizer.  Make sure that all people in your home wash their hands well and often.  Rest at home while you get better.  Take over-the-counter and prescription medicines only as told by your doctor.   Watch your condition for any changes.  Take a warm bath to help with any burning or pain from having watery poop.  Keep all follow-up visits as told by your doctor. This is important. Contact a doctor if:  You cannot keep fluids down.  Your symptoms get worse.  You have new symptoms.  You feel light-headed or dizzy.  You have muscle cramps. Get help right away if:  You have chest pain.  You feel very weak or you pass out (faint).  You see blood in your throw-up.  Your throw-up looks like coffee grounds.  You have bloody or black poop (stools) or poop that look like tar.  You have a very bad headache, a stiff neck, or both.  You have a rash.  You have very bad pain, cramping, or bloating in your belly (abdomen).  You have trouble breathing.  You are breathing very quickly.  Your heart is beating very quickly.  Your skin feels cold and clammy.  You feel confused.  You have pain when you pee.  You have signs of dehydration, such as: ? Dark pee, hardly any pee, or no pee. ? Cracked lips. ? Dry mouth. ? Sunken eyes. ? Sleepiness. ? Weakness. This information is not intended to replace advice given to you by your health care provider. Make sure you discuss any questions you have with your health care provider. Document Released: 01/22/2008 Document Revised: 04/29/2018 Document Reviewed: 04/11/2015 Elsevier Interactive Patient Education  2019 [employer].

## 2018-11-06 NOTE — Progress Notes (Signed)
Subjective:  Patient ID: Melanie Alvarez, female    DOB: 11-Nov-1955  Age: 63 y.o. MRN: [id number]  CC: Diarrhea (pt c/o of diarrhea,chills,bodyache,sore throat,coughing,headache/going on 2 days/no otc/ pt report didnt get no sleep last night--didnt take med this morning. )  URI   This is a new problem. The current episode started in the past 7 days. The problem has been unchanged. There has been no fever. Associated symptoms include congestion, coughing, diarrhea, ear pain, headaches, joint pain, nausea, a plugged ear sensation, rhinorrhea, sinus pain, sneezing and a sore throat. Pertinent negatives include no swollen glands or wheezing. She has tried acetaminophen, decongestant, increased fluids and sleep for the symptoms. The treatment provided mild relief.  no hx of travel or contact with any known covid-19 patient.  Reviewed past Medical, Social and Family history today.  Outpatient Medications Prior to Visit  Medication Sig Dispense Refill  . allopurinol (ZYLOPRIM) 100 MG tablet Take 1 tablet (100 mg total) by mouth 2 (two) times daily. 180 tablet 1  . amLODipine (NORVASC) 10 MG tablet Take 1 tablet (10 mg total) by mouth at bedtime. 90 tablet 1  . aspirin 81 MG chewable tablet Chew 81 mg by mouth daily.    Marland Kitchen atorvastatin (LIPITOR) 20 MG tablet Take 1 tablet (20 mg total) by mouth daily at 6 PM. 90 tablet 1  . cholecalciferol (VITAMIN D) 400 UNITS TABS tablet Take 400 Units by mouth daily.    . metFORMIN (GLUCOPHAGE) 500 MG tablet Take 1 tablet (500 mg total) by mouth 2 (two) times daily with a meal. 180 tablet 1  . metoprolol succinate (TOPROL-XL) 25 MG 24 hr tablet Take 1 tablet (25 mg total) by mouth daily. Take with or immediately following a meal. 90 tablet 1  . potassium chloride (K-DUR,KLOR-CON) 10 MEQ tablet Take 1 tablet (10 mEq total) by mouth daily. 90 tablet 0  . triamterene-hydrochlorothiazide (MAXZIDE-25) 37.5-25 MG tablet Take 1 tablet by mouth daily. 90 tablet 1   No  facility-administered medications prior to visit.     ROS See HPI  Objective:  BP (!) 154/92   Pulse (!) 104   Temp 97.9 F (36.6 C) (Oral)   Ht 5\' 3"  (1.6 m)   Wt 241 lb (109.3 kg)   SpO2 98%   BMI 42.69 kg/m   BP Readings from Last 3 Encounters:  11/06/18 (!) 154/92  10/22/18 136/80  10/13/18 (!) 158/90    Wt Readings from Last 3 Encounters:  11/06/18 241 lb (109.3 kg)  10/22/18 241 lb 12.8 oz (109.7 kg)  10/13/18 240 lb (108.9 kg)    Physical Exam Vitals signs reviewed.  Constitutional:      General: She is not in acute distress. HENT:     Head:     Jaw: No trismus.     Right Ear: Tympanic membrane, ear canal and external ear normal.     Left Ear: Tympanic membrane, ear canal and external ear normal.     Nose: Mucosal edema and rhinorrhea present.     Right Sinus: Maxillary sinus tenderness present. No frontal sinus tenderness.     Left Sinus: Maxillary sinus tenderness present. No frontal sinus tenderness.     Mouth/Throat:     Pharynx: Uvula midline. Posterior oropharyngeal erythema present. No oropharyngeal exudate.  Eyes:     General: No scleral icterus. Neck:     Musculoskeletal: Normal range of motion and neck supple.  Cardiovascular:     Rate and Rhythm: Normal rate.  Heart sounds: Normal heart sounds.  Pulmonary:     Effort: Pulmonary effort is normal.     Breath sounds: Normal breath sounds.  Musculoskeletal:     Right lower leg: No edema.     Left lower leg: No edema.  Lymphadenopathy:     Cervical: Cervical adenopathy present.  Neurological:     Mental Status: She is alert and oriented to person, place, and time.     Lab Results  Component Value Date   WBC 11.2 (H) 04/03/2018   HGB 13.9 04/03/2018   HCT 40.3 04/03/2018   PLT 365.0 04/03/2018   GLUCOSE 165 (H) 10/05/2018   CHOL 203 (H) 04/03/2018   TRIG 114.0 04/03/2018   HDL 42.30 04/03/2018   LDLDIRECT 110 (H) 09/30/2012   LDLCALC 138 (H) 04/03/2018   ALT 15 03/23/2018    AST 16 03/23/2018   NA 140 10/05/2018   K 3.3 (L) 10/05/2018   CL 98 10/05/2018   CREATININE 0.85 10/05/2018   BUN 19 10/05/2018   CO2 32 10/05/2018   TSH 1.959 03/19/2010   INR 1.0 11/25/2017   HGBA1C 7.3 (H) 10/05/2018   MICROALBUR 1.1 10/05/2018    Assessment & Plan:   Marnette was seen today for diarrhea.  Diagnoses and all orders for this visit:  Flu-like symptoms -     POC Influenza A&B(BINAX/QUICKVUE) -     oseltamivir (TAMIFLU) 75 MG capsule; Take 1 capsule (75 mg total) by mouth 2 (two) times daily. -     benzonatate (TESSALON) 100 MG capsule; Take 1 capsule (100 mg total) by mouth 3 (three) times daily as needed for cough. -     guaiFENesin-dextromethorphan (ROBITUSSIN DM) 100-10 MG/5ML syrup; Take 5 mLs by mouth every 4 (four) hours as needed for cough. -     albuterol (PROVENTIL HFA;VENTOLIN HFA) 108 (90 Base) MCG/ACT inhaler; Inhale 1-2 puffs into the lungs every 6 (six) hours as needed.  Gastroenteritis -     ondansetron (ZOFRAN) 4 MG tablet; Take 1 tablet (4 mg total) by mouth every 8 (eight) hours as needed for nausea or vomiting. -     bismuth subsalicylate (PEPTO-BISMOL) 262 MG/15ML suspension; Take 30 mLs by mouth every 6 (six) hours as needed.   I am having Emmajean L. Pasqual start on oseltamivir, ondansetron, bismuth subsalicylate, benzonatate, guaiFENesin-dextromethorphan, and albuterol. I am also having her maintain her cholecalciferol, aspirin, amLODipine, allopurinol, atorvastatin, metFORMIN, potassium chloride, triamterene-hydrochlorothiazide, and metoprolol succinate.  Meds ordered this encounter  Medications  . oseltamivir (TAMIFLU) 75 MG capsule    Sig: Take 1 capsule (75 mg total) by mouth 2 (two) times daily.    Dispense:  10 capsule    Refill:  0    Order Specific Question:   Supervising Provider    Answer:   MATTHEWS, CODY [[id number]]  . ondansetron (ZOFRAN) 4 MG tablet    Sig: Take 1 tablet (4 mg total) by mouth every 8 (eight) hours as needed for  nausea or vomiting.    Dispense:  20 tablet    Refill:  0    Order Specific Question:   Supervising Provider    Answer:   MATTHEWS, CODY [[id number]]  . bismuth subsalicylate (PEPTO-BISMOL) 262 MG/15ML suspension    Sig: Take 30 mLs by mouth every 6 (six) hours as needed.    Dispense:  360 mL    Refill:  0    Order Specific Question:   Supervising Provider    Answer:   MATTHEWS, CODY [[id number]]  .  benzonatate (TESSALON) 100 MG capsule    Sig: Take 1 capsule (100 mg total) by mouth 3 (three) times daily as needed for cough.    Dispense:  20 capsule    Refill:  0    Order Specific Question:   Supervising Provider    Answer:   MATTHEWS, CODY [[id number]]  . guaiFENesin-dextromethorphan (ROBITUSSIN DM) 100-10 MG/5ML syrup    Sig: Take 5 mLs by mouth every 4 (four) hours as needed for cough.    Dispense:  118 mL    Refill:  0    Order Specific Question:   Supervising Provider    Answer:   MATTHEWS, CODY [[id number]]  . albuterol (PROVENTIL HFA;VENTOLIN HFA) 108 (90 Base) MCG/ACT inhaler    Sig: Inhale 1-2 puffs into the lungs every 6 (six) hours as needed.    Dispense:  1 Inhaler    Refill:  0    Order Specific Question:   Supervising Provider    Answer:   MATTHEWS, CODY [[id number]]    Problem List Items Addressed This Visit    None    Visit Diagnoses    Flu-like symptoms    -  Primary   Relevant Medications   oseltamivir (TAMIFLU) 75 MG capsule   benzonatate (TESSALON) 100 MG capsule   guaiFENesin-dextromethorphan (ROBITUSSIN DM) 100-10 MG/5ML syrup   albuterol (PROVENTIL HFA;VENTOLIN HFA) 108 (90 Base) MCG/ACT inhaler   Other Relevant Orders   POC Influenza A&B(BINAX/QUICKVUE) (Completed)   Gastroenteritis       Relevant Medications   ondansetron (ZOFRAN) 4 MG tablet   bismuth subsalicylate (PEPTO-BISMOL) 262 MG/15ML suspension      Follow-up: Return if symptoms worsen or fail to improve.  Wilfred Lacy, NP

## 2018-11-09 ENCOUNTER — Encounter: Payer: Self-pay | Admitting: Nurse Practitioner

## 2018-11-10 ENCOUNTER — Telehealth: Payer: Self-pay

## 2018-11-10 NOTE — Telephone Encounter (Signed)
Ok she will need to call office back if symptoms have not improved on 11/13/2018. Therefore form will completed to reflect current work note for now

## 2018-11-10 NOTE — Telephone Encounter (Signed)
Copied from [place] [id number]. Topic: General - Other >> Nov 10, 2018 11:22 AM Leward Quan A wrote: Reason for CRM: Patient called to say that she can only afford to take the nausea medication, and cough suppressant, BP med's, and her inhaler. She also states that her daughter will be bringing some  paperwork to be completed and returned to her employer. She states that her employer want her out of work until all her symptoms are gone and also that she will get the rest of her medication when she have the money. Please advise Ph# [phone] Daughter Roselind Rily

## 2018-11-11 NOTE — Telephone Encounter (Signed)
Pt stated she feels a lot better since she took the meds, she doesn't any paper work as of right now (going back to work on Monday).

## 2018-12-02 ENCOUNTER — Telehealth: Payer: Self-pay

## 2018-12-02 NOTE — Telephone Encounter (Signed)
Copied from [place] [id number]. Topic: General - Other >> Dec 02, 2018  9:13 AM Keene Breath wrote: Reason for CRM: Patient called to speak with the office manager regarding a no show bill that she received.  Patient stated that the visit should have been cancelled in the office when she was there a week before that appt. And they told her she did not need to keep the later appt.  The acct. # on the bill is [id number].  Please call patient to rectify at [phone]

## 2018-12-04 NOTE — Telephone Encounter (Signed)
Will send no show fee removal request to charge correction for DOS 09/08/2018.

## 2018-12-31 ENCOUNTER — Encounter: Payer: BC Managed Care – PPO | Admitting: Nurse Practitioner

## 2019-01-17 ENCOUNTER — Other Ambulatory Visit: Payer: Self-pay | Admitting: Nurse Practitioner

## 2019-01-17 DIAGNOSIS — I10 Essential (primary) hypertension: Secondary | ICD-10-CM

## 2019-01-18 NOTE — Telephone Encounter (Signed)
Melanie Alvarez pleas advise, last ov was 10/2018 and scheduled for CPE 02/2019 (last K+ check was in 02/20 = 3.3) Please advise, ok to send in refill?

## 2019-02-02 LAB — HM DIABETES EYE EXAM

## 2019-02-09 ENCOUNTER — Telehealth: Payer: Self-pay | Admitting: Nurse Practitioner

## 2019-02-09 MED ORDER — GLUCOSE BLOOD VI STRP: ORAL_STRIP | 6 refills | Status: DC

## 2019-02-09 NOTE — Telephone Encounter (Signed)
Patient is requesting One touch-delica test strips.  Patient states she is out and pharmacy needs new refill. Could not find this on patients meds list. Patient call back # MOBILE [phone]  Pharmacy: [facility] [place] [phone] (Phone) [phone] (Fax

## 2019-02-09 NOTE — Telephone Encounter (Signed)
Please advise, ok to send in rx?

## 2019-02-09 NOTE — Telephone Encounter (Signed)
ok 

## 2019-02-09 NOTE — Telephone Encounter (Signed)
Rx sent, pt is aware.

## 2019-02-10 ENCOUNTER — Encounter: Payer: Self-pay | Admitting: Nurse Practitioner

## 2019-02-10 NOTE — Progress Notes (Signed)
Abstracted result and sent to scan  

## 2019-03-02 ENCOUNTER — Other Ambulatory Visit: Payer: Self-pay | Admitting: Nurse Practitioner

## 2019-03-02 DIAGNOSIS — Z1231 Encounter for screening mammogram for malignant neoplasm of breast: Secondary | ICD-10-CM

## 2019-03-11 ENCOUNTER — Encounter: Payer: BC Managed Care – PPO | Admitting: Nurse Practitioner

## 2019-04-16 ENCOUNTER — Ambulatory Visit: Payer: BC Managed Care – PPO

## 2019-04-28 ENCOUNTER — Other Ambulatory Visit: Payer: Self-pay | Admitting: Nurse Practitioner

## 2019-04-28 DIAGNOSIS — I10 Essential (primary) hypertension: Secondary | ICD-10-CM

## 2019-05-10 ENCOUNTER — Ambulatory Visit: Payer: BC Managed Care – PPO

## 2019-05-24 ENCOUNTER — Telehealth: Payer: Self-pay | Admitting: Nurse Practitioner

## 2019-05-24 NOTE — Telephone Encounter (Signed)

## 2019-05-25 ENCOUNTER — Other Ambulatory Visit: Payer: Self-pay

## 2019-05-25 ENCOUNTER — Ambulatory Visit (INDEPENDENT_AMBULATORY_CARE_PROVIDER_SITE_OTHER): Payer: BC Managed Care – PPO | Admitting: Nurse Practitioner

## 2019-05-25 ENCOUNTER — Encounter: Payer: Self-pay | Admitting: Nurse Practitioner

## 2019-05-25 VITALS — BP 130/80 | HR 95 | Temp 97.9°F | Ht 63.0 in | Wt 226.8 lb

## 2019-05-25 DIAGNOSIS — M109 Gout, unspecified: Secondary | ICD-10-CM

## 2019-05-25 DIAGNOSIS — E790 Hyperuricemia without signs of inflammatory arthritis and tophaceous disease: Secondary | ICD-10-CM | POA: Diagnosis not present

## 2019-05-25 DIAGNOSIS — E119 Type 2 diabetes mellitus without complications: Secondary | ICD-10-CM

## 2019-05-25 DIAGNOSIS — I10 Essential (primary) hypertension: Secondary | ICD-10-CM

## 2019-05-25 DIAGNOSIS — H0520 Unspecified exophthalmos: Secondary | ICD-10-CM | POA: Diagnosis not present

## 2019-05-25 DIAGNOSIS — E782 Mixed hyperlipidemia: Secondary | ICD-10-CM

## 2019-05-25 NOTE — Patient Instructions (Addendum)
Stable thyroid and renal function No change in hgbA1c 7.8. maintain metformin dose. Elevated uric acid. Continue allopurinol dose. maxizide for BP control can sometimes contribute to elevated uric acid. Discontinue maxzide and potassium. I sent olmesartan in place of maxzide to help with BP control. It is imperative for you to make changes to your diet to help improve glucose control and uric acid level. Also avoid sodas. Do you want referral to nutritionist? Schedule 61month f/up appt with me for HTN and uric acid re eval   DASH Eating Plan DASH stands for "Dietary Approaches to Stop Hypertension." The DASH eating plan is a healthy eating plan that has been shown to reduce high blood pressure (hypertension). It may also reduce your risk for type 2 diabetes, heart disease, and stroke. The DASH eating plan may also help with weight loss. What are tips for following this plan?  General guidelines  Avoid eating more than 2,300 mg (milligrams) of salt (sodium) a day. If you have hypertension, you may need to reduce your sodium intake to 1,500 mg a day.  Limit alcohol intake to no more than 1 drink a day for nonpregnant women and 2 drinks a day for men. One drink equals 12 oz of beer, 5 oz of wine, or 1 oz of hard liquor.  Work with your health care provider to maintain a healthy body weight or to lose weight. Ask what an ideal weight is for you.  Get at least 30 minutes of exercise that causes your heart to beat faster (aerobic exercise) most days of the week. Activities may include walking, swimming, or biking.  Work with your health care provider or diet and nutrition specialist (dietitian) to adjust your eating plan to your individual calorie needs. Reading food labels   Check food labels for the amount of sodium per serving. Choose foods with less than 5 percent of the Daily Value of sodium. Generally, foods with less than 300 mg of sodium per serving fit into this eating plan.  To find  whole grains, look for the word "whole" as the first word in the ingredient list. Shopping  Buy products labeled as "low-sodium" or "no salt added."  Buy fresh foods. Avoid canned foods and premade or frozen meals. Cooking  Avoid adding salt when cooking. Use salt-free seasonings or herbs instead of table salt or sea salt. Check with your health care provider or pharmacist before using salt substitutes.  Do not fry foods. Cook foods using healthy methods such as baking, boiling, grilling, and broiling instead.  Cook with heart-healthy oils, such as olive, canola, soybean, or sunflower oil. Meal planning  Eat a balanced diet that includes: ? 5 or more servings of fruits and vegetables each day. At each meal, try to fill half of your plate with fruits and vegetables. ? Up to 6-8 servings of whole grains each day. ? Less than 6 oz of lean meat, poultry, or fish each day. A 3-oz serving of meat is about the same size as a deck of cards. One egg equals 1 oz. ? 2 servings of low-fat dairy each day. ? A serving of nuts, seeds, or beans 5 times each week. ? Heart-healthy fats. Healthy fats called Omega-3 fatty acids are found in foods such as flaxseeds and coldwater fish, like sardines, salmon, and mackerel.  Limit how much you eat of the following: ? Canned or prepackaged foods. ? Food that is high in trans fat, such as fried foods. ? Food that is high  in saturated fat, such as fatty meat. ? Sweets, desserts, sugary drinks, and other foods with added sugar. ? Full-fat dairy products.  Do not salt foods before eating.  Try to eat at least 2 vegetarian meals each week.  Eat more home-cooked food and less restaurant, buffet, and fast food.  When eating at a restaurant, ask that your food be prepared with less salt or no salt, if possible. What foods are recommended? The items listed may not be a complete list. Talk with your dietitian about what dietary choices are best for you. Grains  Whole-grain or whole-wheat bread. Whole-grain or whole-wheat pasta. Brown rice. [place]. Bulgur. Whole-grain and low-sodium cereals. Pita bread. Low-fat, low-sodium crackers. Whole-wheat flour tortillas. Vegetables Fresh or frozen vegetables (raw, steamed, roasted, or grilled). Low-sodium or reduced-sodium tomato and vegetable juice. Low-sodium or reduced-sodium tomato sauce and tomato paste. Low-sodium or reduced-sodium canned vegetables. Fruits All fresh, dried, or frozen fruit. Canned fruit in natural juice (without added sugar). Meat and other protein foods Skinless chicken or [place]. Ground chicken or [place]. Pork with fat trimmed off. Fish and seafood. Egg whites. Dried beans, peas, or lentils. Unsalted nuts, nut butters, and seeds. Unsalted canned beans. Lean cuts of beef with fat trimmed off. Low-sodium, lean deli meat. Dairy Low-fat (1%) or fat-free (skim) milk. Fat-free, low-fat, or reduced-fat cheeses. Nonfat, low-sodium ricotta or cottage cheese. Low-fat or nonfat yogurt. Low-fat, low-sodium cheese. Fats and oils Soft margarine without trans fats. Vegetable oil. Low-fat, reduced-fat, or light mayonnaise and salad dressings (reduced-sodium). Canola, safflower, olive, soybean, and sunflower oils. Avocado. Seasoning and other foods Herbs. Spices. Seasoning mixes without salt. Unsalted popcorn and pretzels. Fat-free sweets. What foods are not recommended? The items listed may not be a complete list. Talk with your dietitian about what dietary choices are best for you. Grains Baked goods made with fat, such as croissants, muffins, or some breads. Dry pasta or rice meal packs. Vegetables Creamed or fried vegetables. Vegetables in a cheese sauce. Regular canned vegetables (not low-sodium or reduced-sodium). Regular canned tomato sauce and paste (not low-sodium or reduced-sodium). Regular tomato and vegetable juice (not low-sodium or reduced-sodium). Angie Fava. Olives. Fruits Canned  fruit in a light or heavy syrup. Fried fruit. Fruit in cream or butter sauce. Meat and other protein foods Fatty cuts of meat. Ribs. Fried meat. Berniece Salines. Sausage. Bologna and other processed lunch meats. Salami. Fatback. Hotdogs. Bratwurst. Salted nuts and seeds. Canned beans with added salt. Canned or smoked fish. Whole eggs or egg yolks. Chicken or [place] with skin. Dairy Whole or 2% milk, cream, and half-and-half. Whole or full-fat cream cheese. Whole-fat or sweetened yogurt. Full-fat cheese. Nondairy creamers. Whipped toppings. Processed cheese and cheese spreads. Fats and oils Butter. Stick margarine. Lard. Shortening. Ghee. Bacon fat. Tropical oils, such as coconut, palm kernel, or palm oil. Seasoning and other foods Salted popcorn and pretzels. Onion salt, garlic salt, seasoned salt, table salt, and sea salt. Worcestershire sauce. Tartar sauce. Barbecue sauce. Teriyaki sauce. Soy sauce, including reduced-sodium. Steak sauce. Canned and packaged gravies. Fish sauce. Oyster sauce. Cocktail sauce. Horseradish that you find on the shelf. Ketchup. Mustard. Meat flavorings and tenderizers. Bouillon cubes. Hot sauce and Tabasco sauce. Premade or packaged marinades. Premade or packaged taco seasonings. Relishes. Regular salad dressings. Where to find more information:  National Heart, Lung, and [place]: https://wilson-eaton.com/  American Heart Association: www.heart.org Summary  The DASH eating plan is a healthy eating plan that has been shown to reduce high blood pressure (hypertension). It may  also reduce your risk for type 2 diabetes, heart disease, and stroke.  With the DASH eating plan, you should limit salt (sodium) intake to 2,300 mg a day. If you have hypertension, you may need to reduce your sodium intake to 1,500 mg a day.  When on the DASH eating plan, aim to eat more fresh fruits and vegetables, whole grains, lean proteins, low-fat dairy, and heart-healthy fats.  Work with your health  care provider or diet and nutrition specialist (dietitian) to adjust your eating plan to your individual calorie needs. This information is not intended to replace advice given to you by your health care provider. Make sure you discuss any questions you have with your health care provider. Document Released: 07/25/2011 Document Revised: 07/18/2017 Document Reviewed: 07/29/2016 Elsevier Patient Education  2020 [employer].

## 2019-05-25 NOTE — Progress Notes (Signed)
Subjective:    Patient ID: Melanie Alvarez, female    DOB: 20-Jul-1956, 63 y.o.   MRN: [id number]  Patient presents today for DM, HTN and possible thyroid dysfunction HPI Thyroid Problem Presents for initial visit. Symptoms include visual change and weight gain. Patient reports no anxiety, cold intolerance, constipation, depressed mood, diaphoresis, diarrhea, dry skin, fatigue, hair loss, heat intolerance, hoarse voice, leg swelling, menstrual problem, nail problem, palpitations, tremors or weight loss. The symptoms have been worsening. Past treatments include nothing. Her past medical history is significant for diabetes, hyperlipidemia and obesity. There is no history of atrial fibrillation, dementia, Graves' ophthalmopathy, neuropathy or osteopenia.   Vision:up to date by [facility]  DM: Current use of metformin, Last hgbA1c of 7.3 Normal urine microalbumin BP at goal LDL not at goal, current use of atovastatin  HTN: Stable with amlodipine, metoprolol and maxizide BP Readings from Last 3 Encounters:  05/25/19 130/80  11/06/18 (!) 154/92  10/22/18 136/80   Weight:  Wt Readings from Last 3 Encounters:  05/25/19 226 lb 12.8 oz (102.9 kg)  11/06/18 241 lb (109.3 kg)  10/22/18 241 lb 12.8 oz (109.7 kg)   Exercise:walking  Fall Risk: Fall Risk  10/05/2018 07/27/2018 12/24/2017 11/25/2017 10/17/2017 09/25/2016 07/22/2016  Falls in the past year? 0 0 No No No No No   Medications and allergies reviewed with patient and updated if appropriate.  Patient Active Problem List   Diagnosis Date Noted  . Exophthalmos of left eye 05/25/2019  . Hyperuricemia 10/06/2018  . Mixed hyperlipidemia 04/06/2018  . Cirrhosis of liver ([place]) 12/26/2017  . Need for prophylactic vaccination and inoculation against viral hepatitis 12/26/2017  . Gouty arthritis 10/20/2017  . Hypokalemia 10/20/2017  . Hypertension 08/13/2012  . DM (diabetes mellitus) ([place]) 08/13/2012  . Tobacco abuse 08/13/2012  .  Chronic hepatitis C without hepatic coma ([place]) 08/13/2012  . Obesity, morbid, BMI 40.0-49.9 (A[facility]) 08/13/2012    Current Outpatient Medications on File Prior to Visit  Medication Sig Dispense Refill  . aspirin 81 MG chewable tablet Chew 81 mg by mouth daily.    . cholecalciferol (VITAMIN D) 400 UNITS TABS tablet Take 400 Units by mouth daily.    Marland Kitchen glucose blood test strip Test blood glucose once daily. One Touch-Delica test strips [id number] each 6   No current facility-administered medications on file prior to visit.     Past Medical History:  Diagnosis Date  . Diabetes mellitus 2012  . Hepatitis 2010   history of Hepatitis C  . Hypertension 2011  . Obesity     Past Surgical History:  Procedure Laterality Date  . COLONOSCOPY  07/24/2011   Procedure: COLONOSCOPY;  Surgeon: Landry Dyke, MD;  Location: [facility];  Service: Endoscopy;  Laterality: N/A;  . Dental Surgery Tooth Extraction      Social History   Socioeconomic History  . Marital status: Married    Spouse name: Not on file  . Number of children: Not on file  . Years of education: Not on file  . Highest education level: Not on file  Occupational History  . Not on file  Social Needs  . Financial resource strain: Not on file  . Food insecurity    Worry: Not on file    Inability: Not on file  . Transportation needs    Medical: Not on file    Non-medical: Not on file  Tobacco Use  . Smoking status: Current Some Day Smoker    Packs/day: 0.50  Types: Cigarettes  . Smokeless tobacco: Never Used  Substance and Sexual Activity  . Alcohol use: Yes    Alcohol/week: 30.0 standard drinks    Types: 24 Cans of beer, 6 Shots of liquor per week    Comment: weekends  . Drug use: No    Comment: previously   . Sexual activity: Yes    Partners: Male  Lifestyle  . Physical activity    Days per week: Not on file    Minutes per session: Not on file  . Stress: Not on file  Relationships  . Social Product manager on phone: Not on file    Gets together: Not on file    Attends religious service: Not on file    Active member of club or organization: Not on file    Attends meetings of clubs or organizations: Not on file    Relationship status: Not on file  Other Topics Concern  . Not on file  Social History Narrative   Previous [facility] pt.  Last MD was Dr. Delman Cheadle, seen 12/2011      Works part time in [employer] with husband    Family History  Problem Relation Age of Onset  . Diabetes Sister   . Cancer Maternal Grandmother        leukemia   . Diabetes Maternal Grandfather   . Diabetes Paternal Grandfather   . Diabetes Paternal Grandmother   . Hypertension Sister   . Hypertension Brother   . Anesthesia problems Neg Hx        ROS See HPI  Objective:   Vitals:   05/25/19 1350  BP: 130/80  Pulse: 95  Temp: 97.9 F (36.6 C)  SpO2: 97%   Body mass index is 40.18 kg/m.  Physical Examination:  Physical Exam Constitutional:      Appearance: She is obese.  Eyes:     Extraocular Movements: Extraocular movements intact.     Conjunctiva/sclera: Conjunctivae normal.     Pupils: Pupils are equal, round, and reactive to light.  Cardiovascular:     Rate and Rhythm: Normal rate and regular rhythm.     Pulses: Normal pulses.     Heart sounds: Normal heart sounds.  Pulmonary:     Effort: Pulmonary effort is normal.     Breath sounds: Normal breath sounds.  Musculoskeletal: Normal range of motion.        General: No tenderness.     Right lower leg: Edema present.     Left lower leg: Edema present.  Skin:    General: Skin is warm and dry.     Findings: No erythema.  Neurological:     Mental Status: She is alert and oriented to person, place, and time.  Psychiatric:        Mood and Affect: Mood normal.        Behavior: Behavior normal.        Thought Content: Thought content normal.     ASSESSMENT and PLAN:  Melanie Alvarez was seen today for  thyroid problem and diabetes.  Diagnoses and all orders for this visit:  Essential hypertension -     Basic metabolic panel -     olmesartan (BENICAR) 5 MG tablet; Take 1 tablet (5 mg total) by mouth daily. -     amLODipine (NORVASC) 10 MG tablet; Take 1 tablet (10 mg total) by mouth at bedtime. -     metoprolol succinate (  TOPROL-XL) 25 MG 24 hr tablet; Take 1 tablet (25 mg total) by mouth daily. Take with or immediately following a meal.  Hyperuricemia -     Uric acid -     allopurinol (ZYLOPRIM) 100 MG tablet; Take 1 tablet (100 mg total) by mouth 3 (three) times daily after meals.  Mixed hyperlipidemia -     Lipid panel -     Hepatic function panel -     atorvastatin (LIPITOR) 20 MG tablet; Take 1 tablet (20 mg total) by mouth daily at 6 PM.  Type 2 diabetes mellitus without complication, without long-term current use of insulin (HCC) -     Hemoglobin A1c -     Basic metabolic panel -     metFORMIN (GLUCOPHAGE) 500 MG tablet; Take 1 tablet (500 mg total) by mouth 2 (two) times daily with a meal.  Exophthalmos of left eye -     TSH -     T3, free -     T4, free  Gouty arthritis -     allopurinol (ZYLOPRIM) 100 MG tablet; Take 1 tablet (100 mg total) by mouth 3 (three) times daily after meals.    Hypertension Discontinued maxzide due to persistent elevated uric acid Added olmesartan 5mg  Maintain amlodipine and metoprolol. BP Readings from Last 3 Encounters:  05/25/19 130/80  11/06/18 (!) 154/92  10/22/18 136/80   F/up in 36month  Hyperuricemia Persistent elevation in uric acid: 8.6 to 10 Increase allopurinol dose to 100mg  TID and d/c maxzide Repeat uric acid in 4weeks      Problem List Items Addressed This Visit      Cardiovascular and Mediastinum   Hypertension - Primary    Discontinued maxzide due to persistent elevated uric acid Added olmesartan 5mg  Maintain amlodipine and metoprolol. BP Readings from Last 3 Encounters:  05/25/19 130/80  11/06/18 (!)  154/92  10/22/18 136/80   F/up in 52month      Relevant Medications   olmesartan (BENICAR) 5 MG tablet   atorvastatin (LIPITOR) 20 MG tablet   amLODipine (NORVASC) 10 MG tablet   metoprolol succinate (TOPROL-XL) 25 MG 24 hr tablet   Other Relevant Orders   Basic metabolic panel (Completed)     Endocrine   DM (diabetes mellitus) (HCC)   Relevant Medications   olmesartan (BENICAR) 5 MG tablet   atorvastatin (LIPITOR) 20 MG tablet   metFORMIN (GLUCOPHAGE) 500 MG tablet   Other Relevant Orders   Hemoglobin A1c (Completed)   Basic metabolic panel (Completed)     Musculoskeletal and Integument   Gouty arthritis   Relevant Medications   allopurinol (ZYLOPRIM) 100 MG tablet     Other   Exophthalmos of left eye   Relevant Orders   TSH (Completed)   T3, free (Completed)   T4, free (Completed)   Hyperuricemia    Persistent elevation in uric acid: 8.6 to 10 Increase allopurinol dose to 100mg  TID and d/c maxzide Repeat uric acid in 4weeks      Relevant Medications   allopurinol (ZYLOPRIM) 100 MG tablet   Other Relevant Orders   Uric acid (Completed)   Mixed hyperlipidemia   Relevant Medications   olmesartan (BENICAR) 5 MG tablet   atorvastatin (LIPITOR) 20 MG tablet   amLODipine (NORVASC) 10 MG tablet   metoprolol succinate (TOPROL-XL) 25 MG 24 hr tablet   Other Relevant Orders   Lipid panel (Completed)   Hepatic function panel (Completed)      Follow up: Return in about 4 weeks (  around 06/22/2019) for HTN and hyperuricemia( repeat uric acid and hepatic panel).  Wilfred Lacy, NP

## 2019-05-26 LAB — BASIC METABOLIC PANEL
BUN: 16 mg/dL (ref 6–23)
CO2: 31 mEq/L (ref 19–32)
Calcium: 10.1 mg/dL (ref 8.4–10.5)
Chloride: 95 mEq/L — ABNORMAL LOW (ref 96–112)
Creatinine, Ser: 1.07 mg/dL (ref 0.40–1.20)
GFR: 62.83 mL/min (ref >60.00)
Glucose, Bld: 119 mg/dL — ABNORMAL HIGH (ref 70–99)
Potassium: 3.6 mEq/L (ref 3.5–5.1)
Sodium: 138 mEq/L (ref 135–145)

## 2019-05-26 LAB — LIPID PANEL
Cholesterol: 147 mg/dL (ref 0–200)
HDL: 49 mg/dL (ref >39.00)
LDL Cholesterol: 67 mg/dL (ref 0–99)
NonHDL: 98.39
Total CHOL/HDL Ratio: 3
Triglycerides: 156 mg/dL — ABNORMAL HIGH (ref 0.0–149.0)
VLDL: 31.2 mg/dL (ref 0.0–40.0)

## 2019-05-26 LAB — HEPATIC FUNCTION PANEL
ALT: 48 U/L — ABNORMAL HIGH (ref 0–35)
AST: 38 U/L — ABNORMAL HIGH (ref 0–37)
Albumin: 4.1 g/dL (ref 3.5–5.2)
Alkaline Phosphatase: 72 U/L (ref 39–117)
Bilirubin, Direct: 0.2 mg/dL (ref 0.0–0.3)
Total Bilirubin: 0.6 mg/dL (ref 0.2–1.2)
Total Protein: 7.5 g/dL (ref 6.0–8.3)

## 2019-05-26 LAB — T4, FREE: Free T4: 0.91 ng/dL (ref 0.60–1.60)

## 2019-05-26 LAB — URIC ACID: Uric Acid, Serum: 10 mg/dL — ABNORMAL HIGH (ref 2.4–7.0)

## 2019-05-26 LAB — T3, FREE: T3, Free: 3.3 pg/mL (ref 2.3–4.2)

## 2019-05-26 LAB — TSH: TSH: 2.9 u[IU]/mL (ref 0.35–4.50)

## 2019-05-26 LAB — HEMOGLOBIN A1C: Hgb A1c MFr Bld: 7.8 % — ABNORMAL HIGH (ref 4.6–6.5)

## 2019-05-26 MED ORDER — METFORMIN HCL 500 MG PO TABS: 500.0000 mg | ORAL_TABLET | Freq: Two times a day (BID) | ORAL | 1 refills | Status: DC

## 2019-05-26 MED ORDER — ATORVASTATIN CALCIUM 20 MG PO TABS: 20.0000 mg | ORAL_TABLET | Freq: Every day | ORAL | 3 refills | Status: DC

## 2019-05-26 MED ORDER — ALLOPURINOL 100 MG PO TABS: 100.0000 mg | ORAL_TABLET | Freq: Three times a day (TID) | ORAL | 5 refills | Status: DC

## 2019-05-26 MED ORDER — AMLODIPINE BESYLATE 10 MG PO TABS: 10.0000 mg | ORAL_TABLET | Freq: Every day | ORAL | 3 refills | Status: DC

## 2019-05-26 MED ORDER — OLMESARTAN MEDOXOMIL 5 MG PO TABS: 5.0000 mg | ORAL_TABLET | Freq: Every day | ORAL | 1 refills | Status: DC

## 2019-05-26 MED ORDER — METOPROLOL SUCCINATE ER 25 MG PO TB24: 25.0000 mg | ORAL_TABLET | Freq: Every day | ORAL | 3 refills | Status: DC

## 2019-05-26 NOTE — Assessment & Plan Note (Signed)
Uncontrolled DM due to noncompliance with diet. hgnA1c of 7.8 Continue metformin 500mg  BID Normal urine microalbumin LDL and BP at goal

## 2019-05-26 NOTE — Assessment & Plan Note (Signed)
Discontinued maxzide due to persistent elevated uric acid Added olmesartan 5mg  Maintain amlodipine and metoprolol. BP Readings from Last 3 Encounters:  05/25/19 130/80  11/06/18 (!) 154/92  10/22/18 136/80   F/up in 68month

## 2019-05-26 NOTE — Assessment & Plan Note (Signed)
LDL at goal. Continue atorvastatin.  

## 2019-05-26 NOTE — Assessment & Plan Note (Signed)
Persistent elevation in uric acid: 8.6 to 10 Increase allopurinol dose to 100mg  TID and d/c maxzide Repeat uric acid in 4weeks

## 2019-06-29 ENCOUNTER — Telehealth: Payer: Self-pay | Admitting: Nurse Practitioner

## 2019-06-29 NOTE — Telephone Encounter (Signed)

## 2019-06-30 ENCOUNTER — Other Ambulatory Visit: Payer: Self-pay

## 2019-06-30 ENCOUNTER — Ambulatory Visit: Payer: BC Managed Care – PPO | Admitting: Nurse Practitioner

## 2019-06-30 ENCOUNTER — Encounter: Payer: Self-pay | Admitting: Nurse Practitioner

## 2019-06-30 VITALS — BP 122/80 | HR 90 | Temp 97.1°F | Ht 63.0 in | Wt 226.0 lb

## 2019-06-30 DIAGNOSIS — E790 Hyperuricemia without signs of inflammatory arthritis and tophaceous disease: Secondary | ICD-10-CM | POA: Diagnosis not present

## 2019-06-30 DIAGNOSIS — R21 Rash and other nonspecific skin eruption: Secondary | ICD-10-CM

## 2019-06-30 DIAGNOSIS — I10 Essential (primary) hypertension: Secondary | ICD-10-CM

## 2019-06-30 LAB — BASIC METABOLIC PANEL
BUN: 13 mg/dL (ref 6–23)
CO2: 28 mEq/L (ref 19–32)
Calcium: 9.3 mg/dL (ref 8.4–10.5)
Chloride: 99 mEq/L (ref 96–112)
Creatinine, Ser: 0.66 mg/dL (ref 0.40–1.20)
GFR: 109.7 mL/min (ref >60.00)
Glucose, Bld: 235 mg/dL — ABNORMAL HIGH (ref 70–99)
Potassium: 3.5 mEq/L (ref 3.5–5.1)
Sodium: 135 mEq/L (ref 135–145)

## 2019-06-30 LAB — URIC ACID: Uric Acid, Serum: 4.9 mg/dL (ref 2.4–7.0)

## 2019-06-30 MED ORDER — NYSTATIN-TRIAMCINOLONE 100000-0.1 UNIT/GM-% EX CREA: 1.0000 "application " | TOPICAL_CREAM | Freq: Two times a day (BID) | CUTANEOUS | 0 refills | Status: DC

## 2019-06-30 MED ORDER — OLMESARTAN MEDOXOMIL 5 MG PO TABS: 5.0000 mg | ORAL_TABLET | Freq: Every day | ORAL | 1 refills | Status: DC

## 2019-06-30 NOTE — Assessment & Plan Note (Signed)
Stable BP with olmesartan, amlodipine and metoprolol. She has made changes to diet (low fat/low carb and low purine diet). She also maintains adequate oral hydration with water. She has lost 25lbs since 10/2018 Wt Readings from Last 3 Encounters:  06/30/19 226 lb (102.5 kg)  05/25/19 226 lb 12.8 oz (102.9 kg)  11/06/18 241 lb (109.3 kg)   BP Readings from Last 3 Encounters:  06/30/19 122/80  05/25/19 130/80  11/06/18 (!) 154/92   Repeat BMP today F/up in 8months

## 2019-06-30 NOTE — Patient Instructions (Addendum)
Use cream for 2weeks, then stop. Call office if no improvement.  Significant improvement in uric acid: 10 to 4.9. Maintain current dose of allopurinol and olmesartan Stable renal function with elevated glucose level. Maintain low carb/sugar diet. Will repeat hgbA1c in 91months. You need to be fasting for that appt.

## 2019-06-30 NOTE — Progress Notes (Signed)
Subjective:  Patient ID: Melanie Alvarez, female    DOB: 1956-03-08  Age: 63 y.o. MRN: [id number]  CC: Follow-up (follow up BP and uric acid/rash under breast area,itchying--not sure if its side affect of olmesartan?)  Rash This is a new problem. The current episode started 1 to 4 weeks ago. The problem is unchanged. Location: under breasts. The rash is characterized by dryness and itchiness. It is unknown if there was an exposure to a precipitant. Pertinent negatives include no congestion, fatigue, fever, joint pain, nail changes or shortness of breath. Past treatments include nothing. There is no history of allergies, asthma, eczema or varicella.   Hyperuricemia: Denies any adverse reaction with allopurinol 100mg  TID She has made changes to diet (low fat/low carb and low purine diet). She also maintains adequate oral hydration with water. She has lost 25lbs since 10/2018 Wt Readings from Last 3 Encounters:  06/30/19 226 lb (102.5 kg)  05/25/19 226 lb 12.8 oz (102.9 kg)  11/06/18 241 lb (109.3 kg)   No cough or lip/facial swelling with olmesartan Maxzide discontinued due to elevated uric acid. BP Readings from Last 3 Encounters:  06/30/19 122/80  05/25/19 130/80  11/06/18 (!) 154/92   Revewed past Medical, Social and Family history today.  Outpatient Medications Prior to Visit  Medication Sig Dispense Refill  . allopurinol (ZYLOPRIM) 100 MG tablet Take 1 tablet (100 mg total) by mouth 3 (three) times daily after meals. 90 tablet 5  . amLODipine (NORVASC) 10 MG tablet Take 1 tablet (10 mg total) by mouth at bedtime. 90 tablet 3  . aspirin 81 MG chewable tablet Chew 81 mg by mouth daily.    Marland Kitchen atorvastatin (LIPITOR) 20 MG tablet Take 1 tablet (20 mg total) by mouth daily at 6 PM. 90 tablet 3  . cholecalciferol (VITAMIN D) 400 UNITS TABS tablet Take 400 Units by mouth daily.    Marland Kitchen glucose blood test strip Test blood glucose once daily. One Touch-Delica test strips [id number] each 6  .  metFORMIN (GLUCOPHAGE) 500 MG tablet Take 1 tablet (500 mg total) by mouth 2 (two) times daily with a meal. 180 tablet 1  . metoprolol succinate (TOPROL-XL) 25 MG 24 hr tablet Take 1 tablet (25 mg total) by mouth daily. Take with or immediately following a meal. 90 tablet 3  . olmesartan (BENICAR) 5 MG tablet Take 1 tablet (5 mg total) by mouth daily. 30 tablet 1   No facility-administered medications prior to visit.     ROS See HPI  Objective:  BP 122/80   Pulse 90   Temp (!) 97.1 F (36.2 C) (Tympanic)   Ht 5\' 3"  (1.6 m)   Wt 226 lb (102.5 kg)   SpO2 98%   BMI 40.03 kg/m   BP Readings from Last 3 Encounters:  06/30/19 122/80  05/25/19 130/80  11/06/18 (!) 154/92    Wt Readings from Last 3 Encounters:  06/30/19 226 lb (102.5 kg)  05/25/19 226 lb 12.8 oz (102.9 kg)  11/06/18 241 lb (109.3 kg)    Physical Exam Vitals signs reviewed.  Constitutional:      Appearance: She is obese.  Neck:     Musculoskeletal: Normal range of motion and neck supple.  Cardiovascular:     Rate and Rhythm: Normal rate.     Pulses: Normal pulses.  Pulmonary:     Effort: Pulmonary effort is normal.  Skin:    Findings: Rash present. Rash is macular.       Neurological:  Mental Status: She is alert and oriented to person, place, and time.  Psychiatric:        Mood and Affect: Mood normal.        Behavior: Behavior normal.        Thought Content: Thought content normal.     Lab Results  Component Value Date   WBC 11.2 (H) 04/03/2018   HGB 13.9 04/03/2018   HCT 40.3 04/03/2018   PLT 365.0 04/03/2018   GLUCOSE 235 (H) 06/30/2019   CHOL 147 05/25/2019   TRIG 156.0 (H) 05/25/2019   HDL 49.00 05/25/2019   LDLDIRECT 110 (H) 09/30/2012   LDLCALC 67 05/25/2019   ALT 48 (H) 05/25/2019   AST 38 (H) 05/25/2019   NA 135 06/30/2019   K 3.5 06/30/2019   CL 99 06/30/2019   CREATININE 0.66 06/30/2019   BUN 13 06/30/2019   CO2 28 06/30/2019   TSH 2.90 05/25/2019   INR 1.0  11/25/2017   HGBA1C 7.8 (H) 05/25/2019   MICROALBUR 1.1 10/05/2018    Assessment & Plan:   Melanie Alvarez was seen today for follow-up.  Diagnoses and all orders for this visit:  Hyperuricemia -     Uric acid -     HTN_1 BMP  Essential hypertension -     HTN_1 BMP -     olmesartan (BENICAR) 5 MG tablet; Take 1 tablet (5 mg total) by mouth daily.  Rash -     nystatin-triamcinolone (MYCOLOG II) cream; Apply 1 application topically 2 (two) times daily.   I am having Melanie Alvarez start on nystatin-triamcinolone. I am also having her maintain her cholecalciferol, aspirin, glucose blood, allopurinol, atorvastatin, amLODipine, metFORMIN, metoprolol succinate, and olmesartan.  Meds ordered this encounter  Medications  . nystatin-triamcinolone (MYCOLOG II) cream    Sig: Apply 1 application topically 2 (two) times daily.    Dispense:  30 g    Refill:  0    Order Specific Question:   Supervising Provider    Answer:   MATTHEWS, CODY [[id number]]  . olmesartan (BENICAR) 5 MG tablet    Sig: Take 1 tablet (5 mg total) by mouth daily.    Dispense:  90 tablet    Refill:  1    Order Specific Question:   Supervising Provider    Answer:   MATTHEWS, CODY [[id number]]    Problem List Items Addressed This Visit      Cardiovascular and Mediastinum   Hypertension    Stable BP with olmesartan, amlodipine and metoprolol. She has made changes to diet (low fat/low carb and low purine diet). She also maintains adequate oral hydration with water. She has lost 25lbs since 10/2018 Wt Readings from Last 3 Encounters:  06/30/19 226 lb (102.5 kg)  05/25/19 226 lb 12.8 oz (102.9 kg)  11/06/18 241 lb (109.3 kg)   BP Readings from Last 3 Encounters:  06/30/19 122/80  05/25/19 130/80  11/06/18 (!) 154/92   Repeat BMP today F/up in 74months      Relevant Medications   olmesartan (BENICAR) 5 MG tablet   Other Relevant Orders   HTN_1 BMP (Completed)     Other   Hyperuricemia - Primary   Relevant Orders    Uric acid (Completed)   HTN_1 BMP (Completed)    Other Visit Diagnoses    Rash       Relevant Medications   nystatin-triamcinolone (MYCOLOG II) cream       Follow-up: Return in about 3 months (around 09/30/2019) for DM and  HTN, hyperlipidemia (fasting).  Wilfred Lacy, NP

## 2019-07-06 ENCOUNTER — Other Ambulatory Visit: Payer: Self-pay

## 2019-07-06 DIAGNOSIS — Z20822 Contact with and (suspected) exposure to covid-19: Secondary | ICD-10-CM

## 2019-07-07 LAB — NOVEL CORONAVIRUS, NAA: SARS-CoV-2, NAA: NOT DETECTED

## 2019-07-28 ENCOUNTER — Ambulatory Visit: Payer: BC Managed Care – PPO | Admitting: Internal Medicine

## 2019-08-06 ENCOUNTER — Other Ambulatory Visit: Payer: Self-pay | Admitting: Nurse Practitioner

## 2019-08-06 DIAGNOSIS — M109 Gout, unspecified: Secondary | ICD-10-CM

## 2019-08-06 DIAGNOSIS — E790 Hyperuricemia without signs of inflammatory arthritis and tophaceous disease: Secondary | ICD-10-CM

## 2019-08-11 ENCOUNTER — Other Ambulatory Visit: Payer: Self-pay

## 2019-08-11 ENCOUNTER — Encounter: Payer: Self-pay | Admitting: Internal Medicine

## 2019-08-11 ENCOUNTER — Ambulatory Visit: Payer: BC Managed Care – PPO | Admitting: Internal Medicine

## 2019-08-11 ENCOUNTER — Ambulatory Visit
Admission: RE | Admit: 2019-08-11 | Discharge: 2019-08-11 | Disposition: A | Discharge status: Home / Self Care | Payer: BC Managed Care – PPO | Source: Ambulatory Visit | Attending: Nurse Practitioner | Admitting: Nurse Practitioner

## 2019-08-11 VITALS — BP 148/83 | HR 87 | Wt 217.8 lb

## 2019-08-11 DIAGNOSIS — Z7141 Alcohol abuse counseling and surveillance of alcoholic: Secondary | ICD-10-CM | POA: Diagnosis not present

## 2019-08-11 DIAGNOSIS — Z1231 Encounter for screening mammogram for malignant neoplasm of breast: Secondary | ICD-10-CM

## 2019-08-11 DIAGNOSIS — K7460 Unspecified cirrhosis of liver: Secondary | ICD-10-CM

## 2019-08-11 IMAGING — MG DIGITAL SCREENING BILAT W/ TOMO W/ CAD
6 of 10 series · 6 of 30 positions shown · non-contrast
Comparison: Previous exam(s).

CLINICAL DATA: Screening.

EXAM:
DIGITAL SCREENING BILATERAL MAMMOGRAM WITH TOMO AND CAD

[R CC synth-2D]
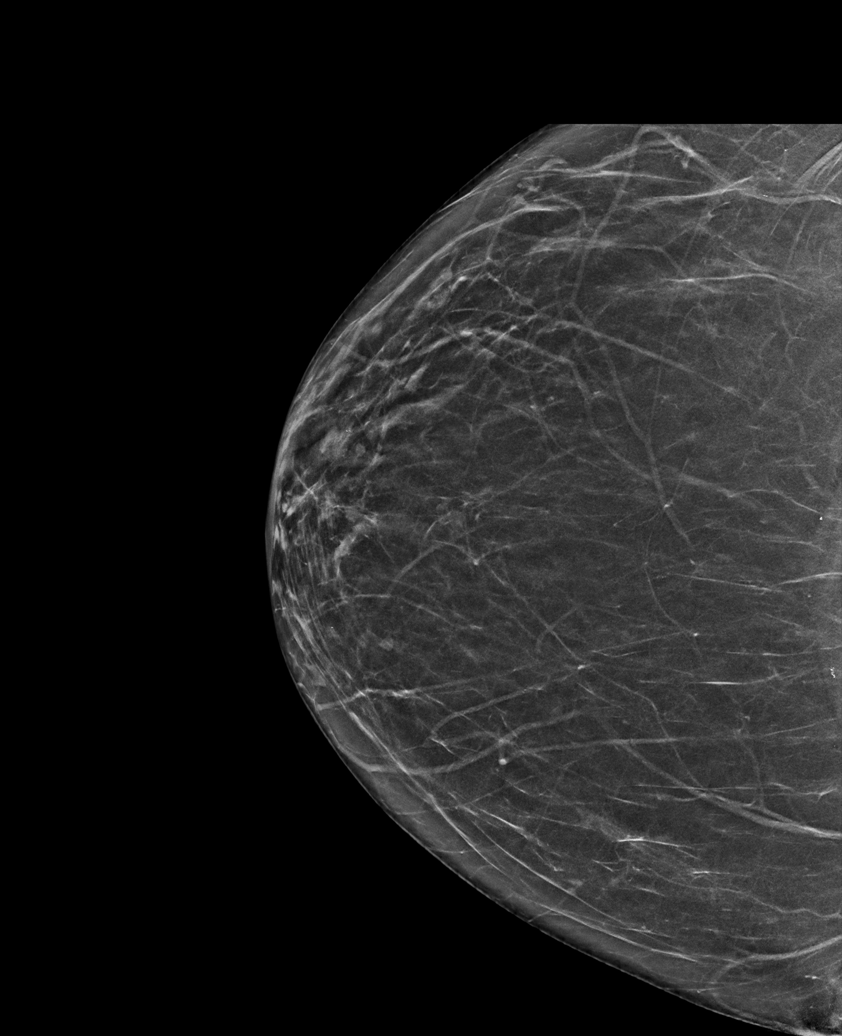

[L MLO synth-2D]
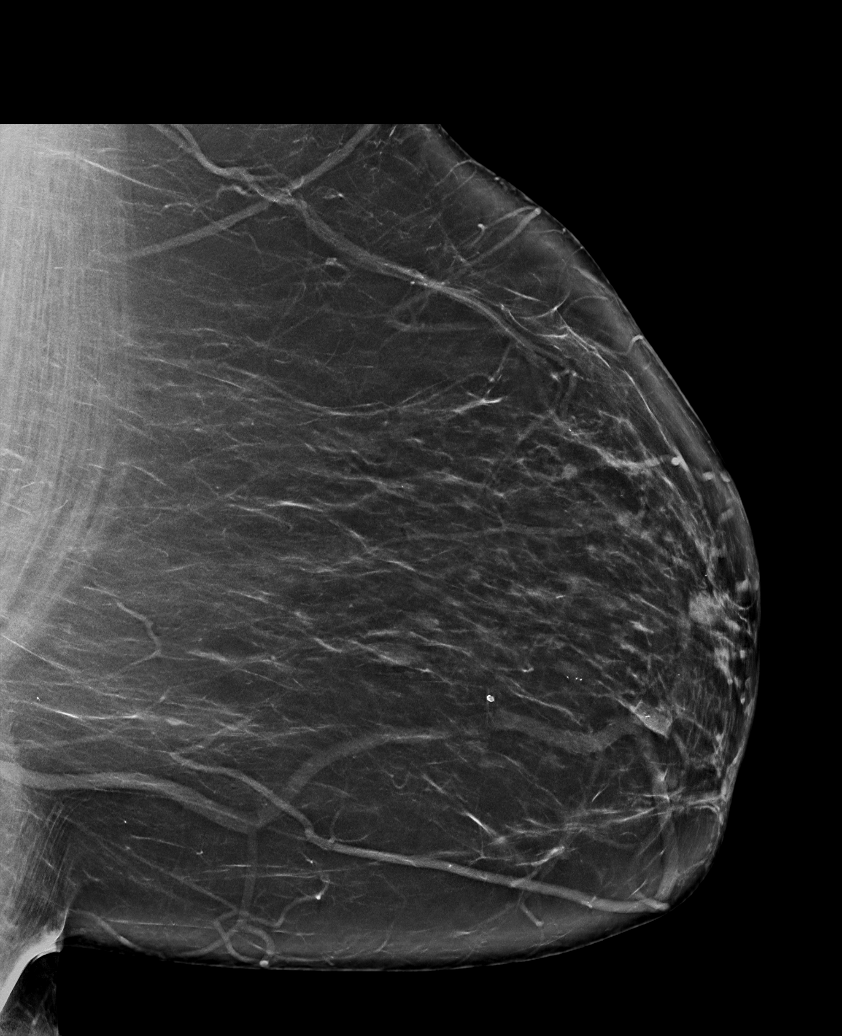

[L XCCL synth-2D]
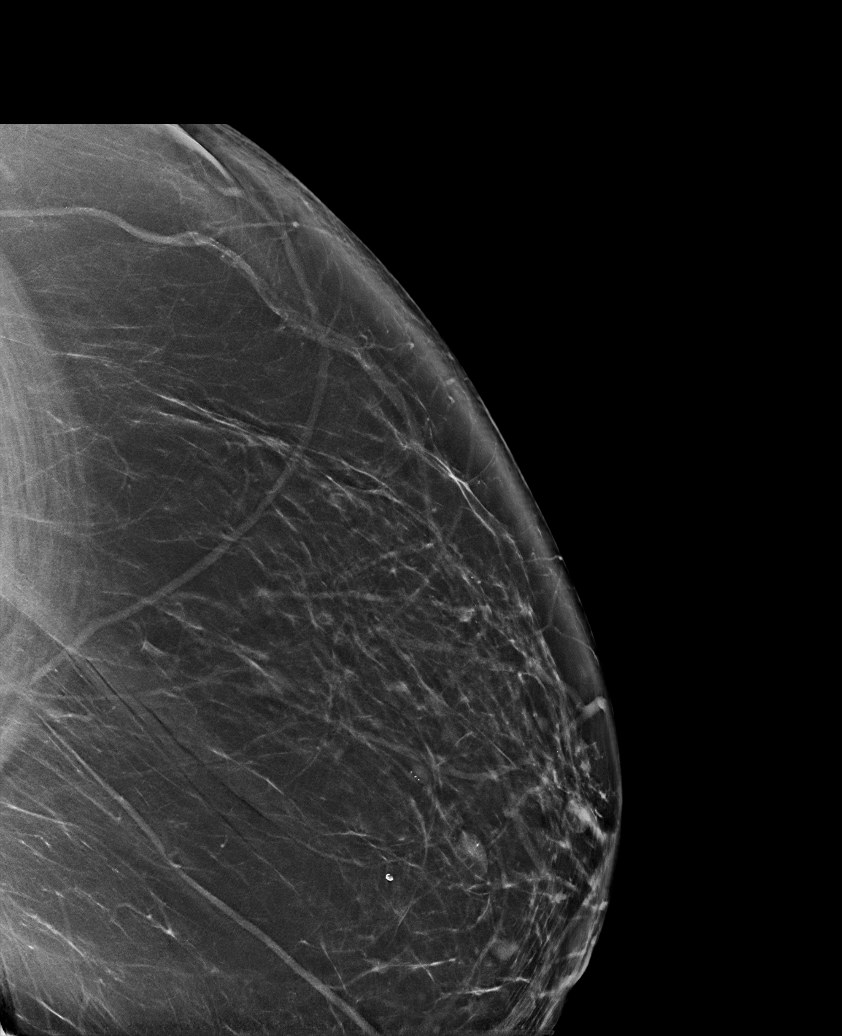

[R MLO synth-2D]
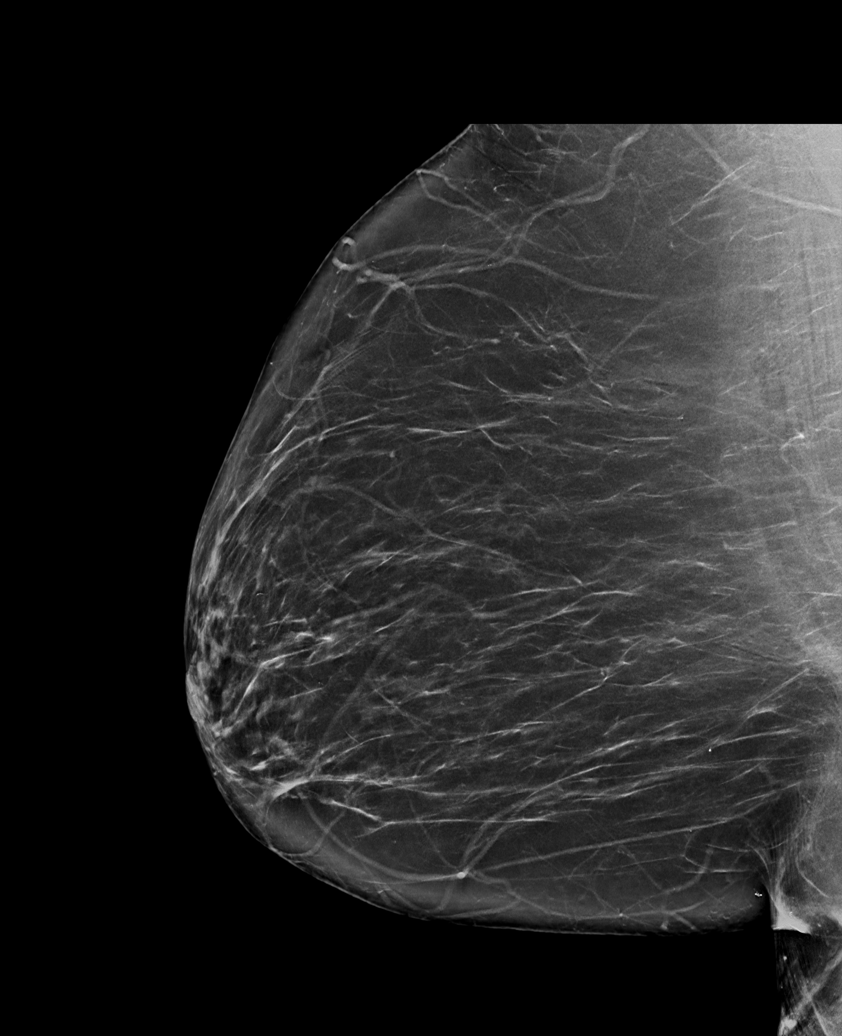

[L CC synth-2D]
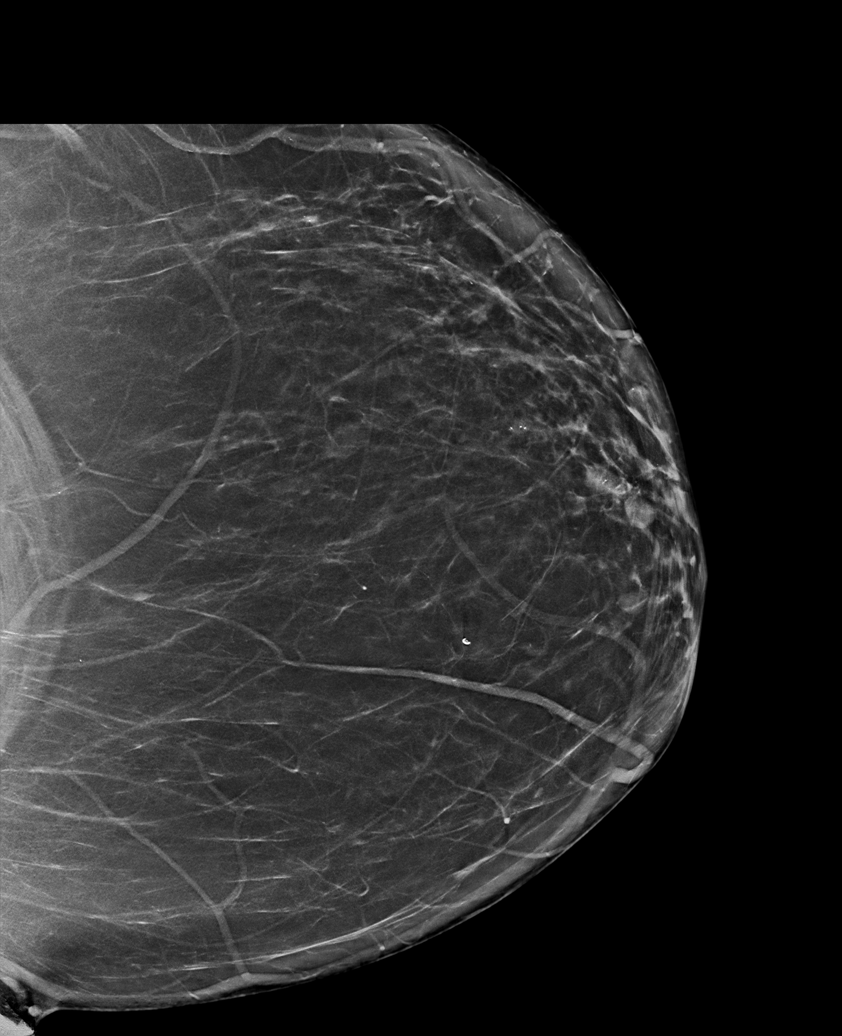

[L CC tomo · tomo slice 41/80.0]
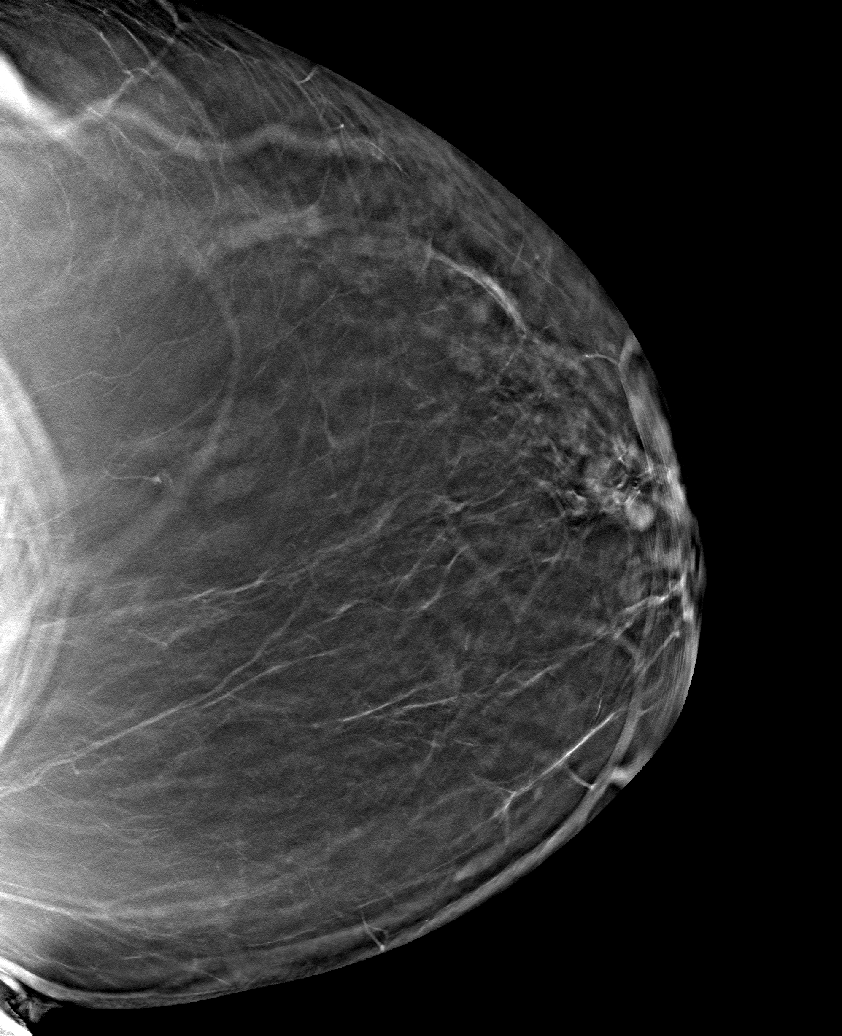

[6 of 30 positions shown; findings below may reference images not displayed]

ACR Breast Density Category b: There are scattered areas of
fibroglandular density.
FINDINGS: There are no findings suspicious for malignancy. Images were
processed with CAD.
IMPRESSION: No mammographic evidence of malignancy. A result letter of this
screening mammogram will be mailed directly to the patient.

RECOMMENDATION:
Screening mammogram in one year. (Code:[TQ])

BI-RADS CATEGORY  1: Negative.

## 2019-08-11 NOTE — Assessment & Plan Note (Signed)
Cirrhosis on ultrasound.  Will continue with [facility] screening and refer her to [facility] for possible EGD. Will repeat every 6 months.

## 2019-08-11 NOTE — Progress Notes (Signed)
   Subjective:    Patient ID: Melanie Alvarez, female    DOB: Feb 27, 1956, 63 y.o.   MRN: [id number]  HPI Here for follow up of cirrhosis. She has a history of chronic hepatitis C s/p successful treatment with Harvoni and achieved SVR12.  No new issues.  Scheduled to have an ultrasound every 6 months for [facility] screening.  Initial elastography with F3/4 though Fibrosure with F0.  Ultrasound at that time though with no cirrhosis.  Her last [place] though last year noted cirrhosis.  Has not been to GI in years, did have a colonoscopy at [facility] years ago.    Review of Systems  Constitutional: Negative for fatigue and unexpected weight change.  Gastrointestinal: Negative for diarrhea and nausea.  Skin: Negative for rash.       Objective:   Physical Exam Constitutional:      Appearance: Normal appearance.  Eyes:     General: No scleral icterus. Cardiovascular:     Rate and Rhythm: Normal rate and regular rhythm.  Pulmonary:     Effort: Pulmonary effort is normal.  Neurological:     General: No focal deficit present.     Mental Status: She is alert.  Psychiatric:        Mood and Affect: Mood normal.    SH: + alcohol       Assessment & Plan:

## 2019-08-11 NOTE — Assessment & Plan Note (Signed)
I discussed the need to stop drinking completely with her cirrhosis.  She voiced her understanding.

## 2019-08-12 ENCOUNTER — Encounter: Payer: Self-pay | Admitting: Gastroenterology

## 2019-08-19 ENCOUNTER — Ambulatory Visit
Admission: RE | Admit: 2019-08-19 | Discharge: 2019-08-19 | Disposition: A | Discharge status: Home / Self Care | Payer: BC Managed Care – PPO | Source: Ambulatory Visit | Attending: Internal Medicine | Admitting: Internal Medicine

## 2019-08-19 DIAGNOSIS — K7460 Unspecified cirrhosis of liver: Secondary | ICD-10-CM

## 2019-08-19 IMAGING — US US ABDOMEN LIMITED
1 series · 14 of 25 positions shown · non-contrast
Comparison: Right upper quadrant ultrasound [DATE].

CLINICAL DATA: History of cirrhosis. Screening for hepatocellular
carcinoma.

EXAM:
ULTRASOUND ABDOMEN LIMITED RIGHT UPPER QUADRANT

[Series 1: us abdomen limited · 0.30mm/px · 14 of 42 slices shown]
[im 1/42]
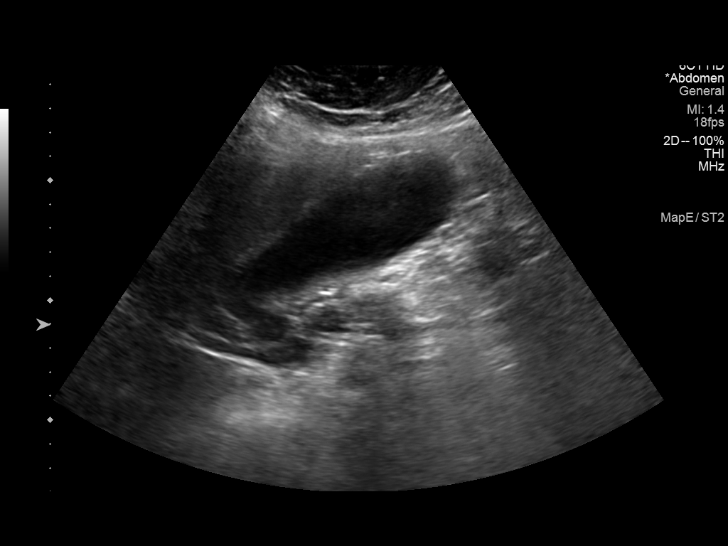
[im 4/42]
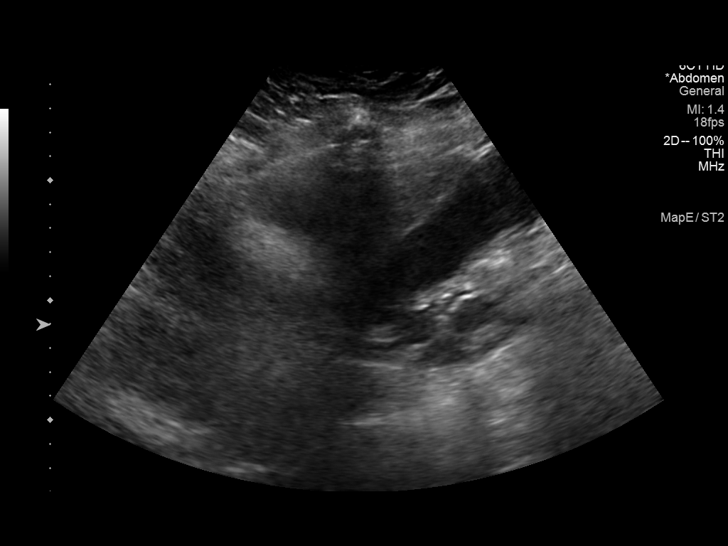
[im 7/42]
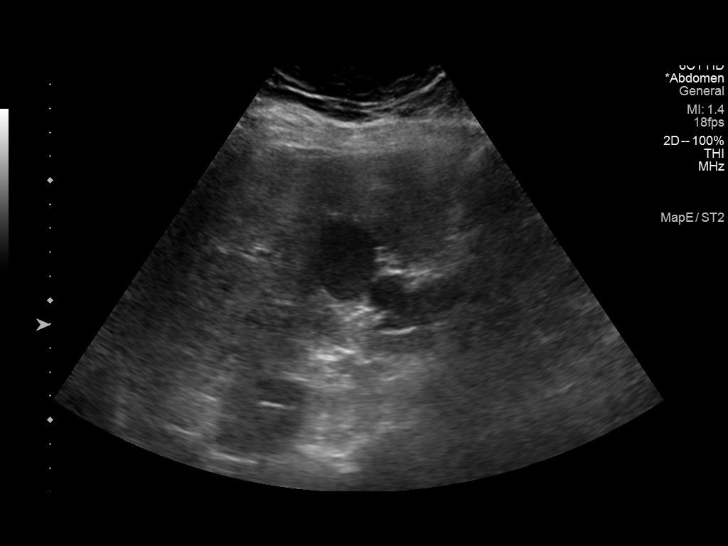
[im 11/42]
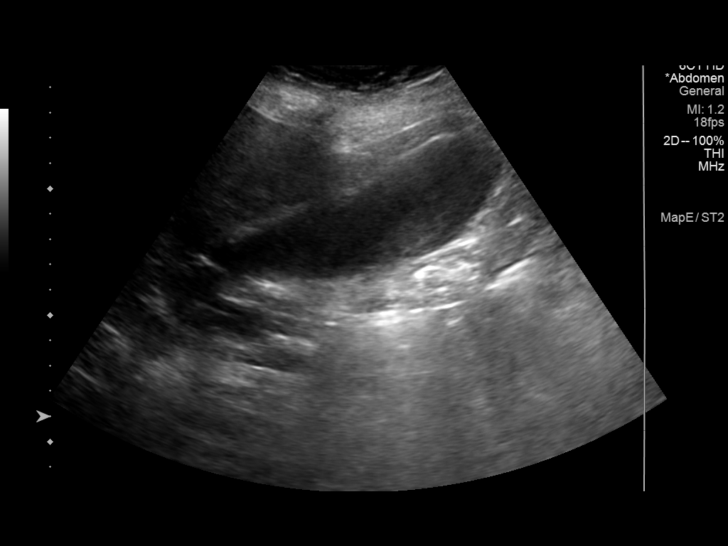
[im 14/42]
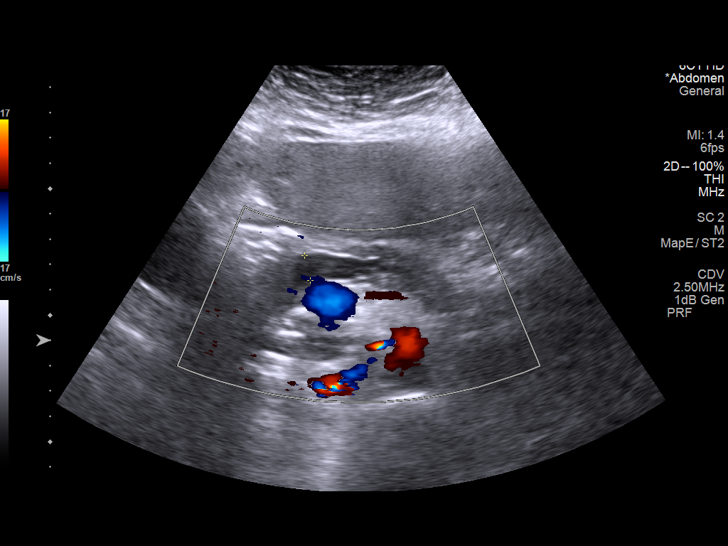
[im 16/42]
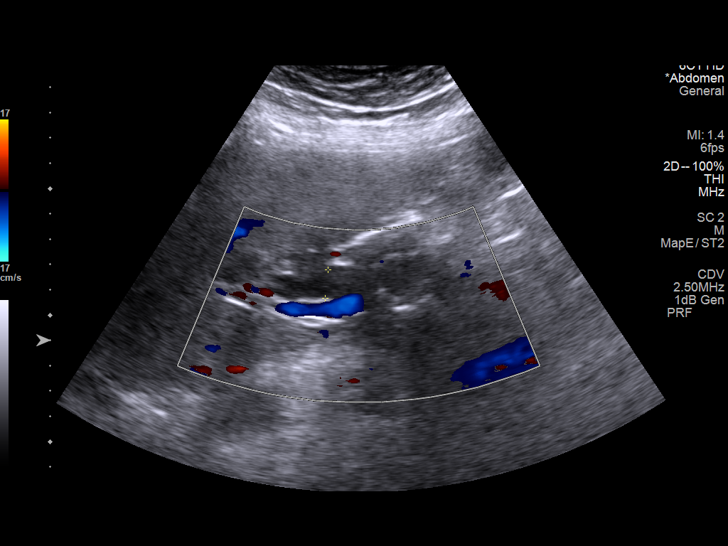
[im 19/42]
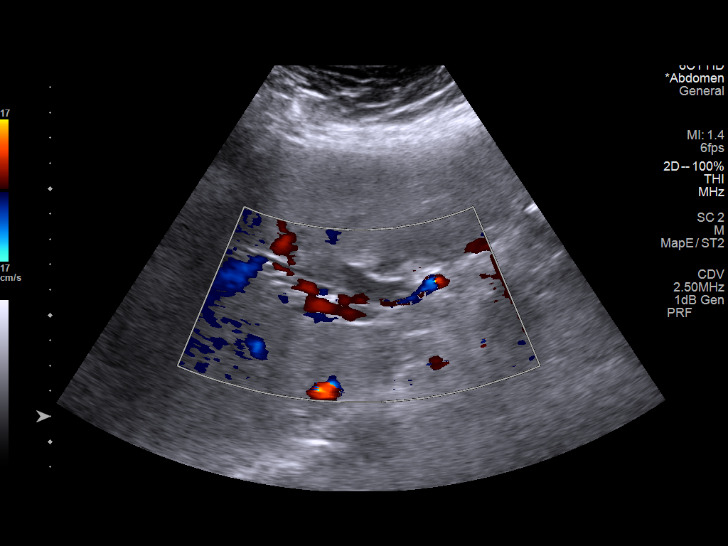
[im 23/42]
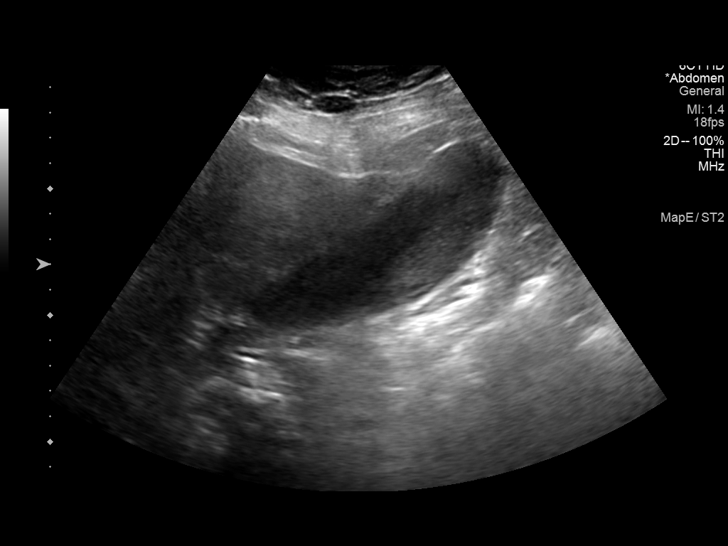
[im 26/42]
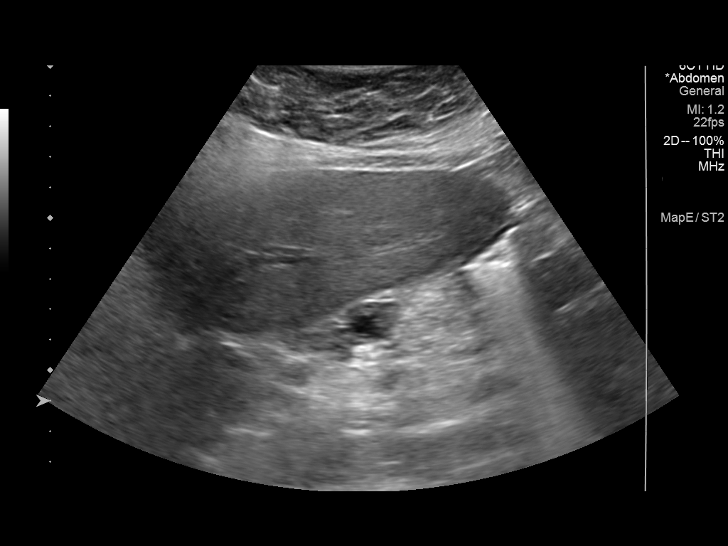
[im 28/42]
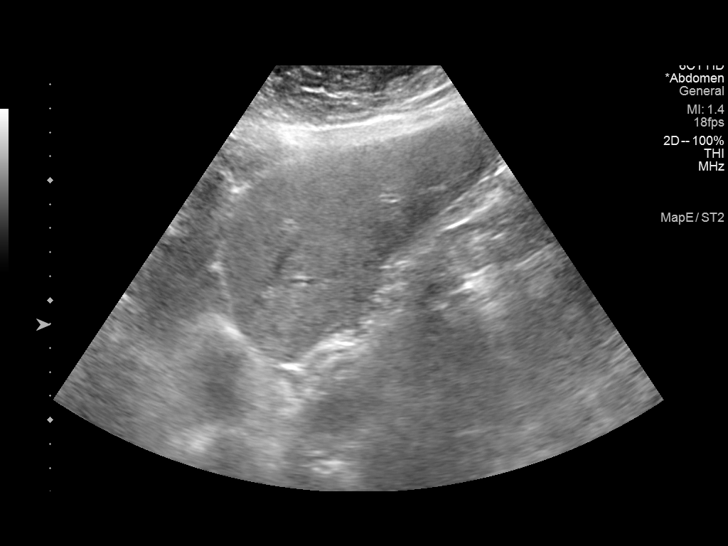
[im 31/42]
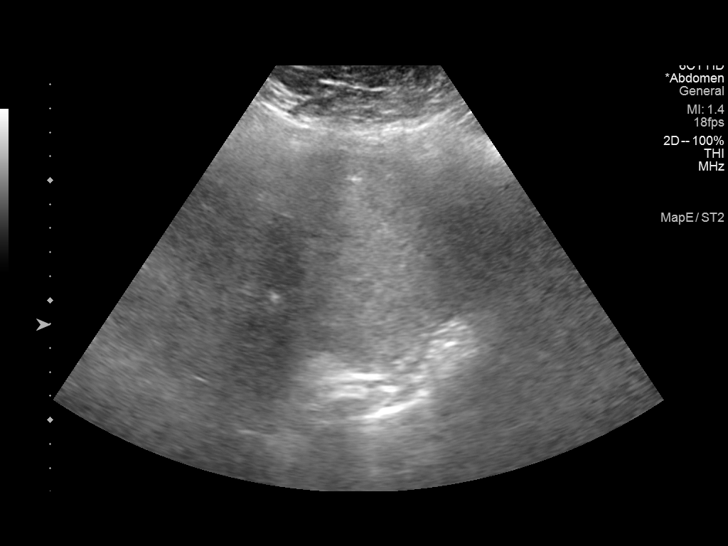
[im 35/42]
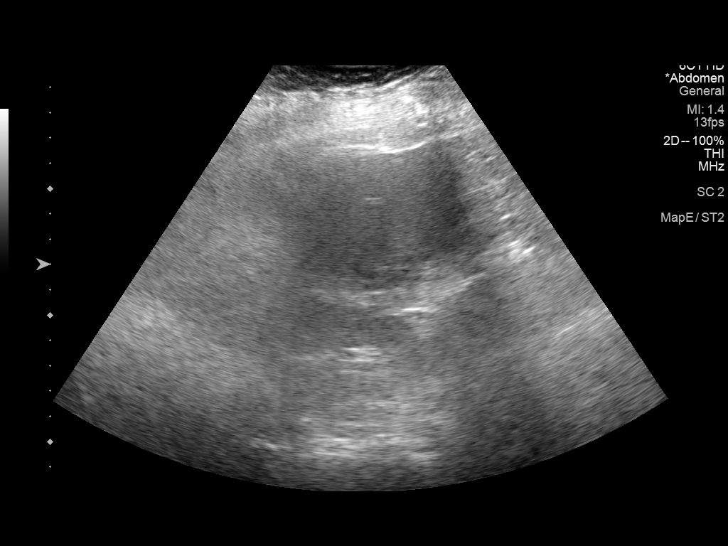
[im 38/42]
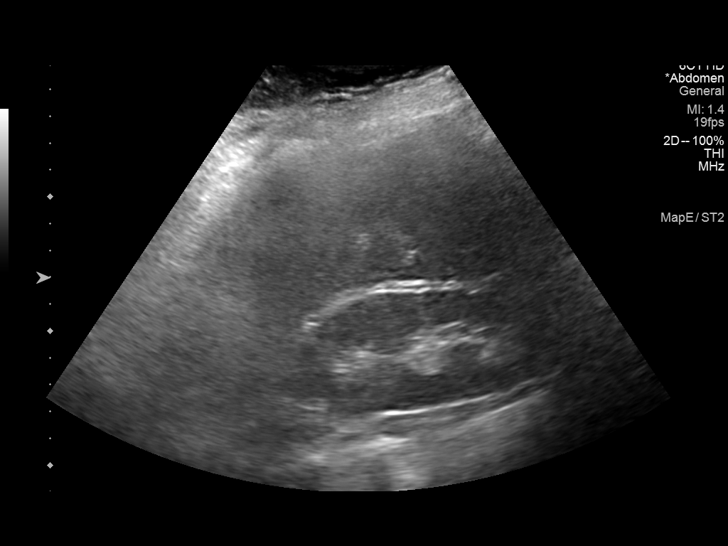
[im 42/42]
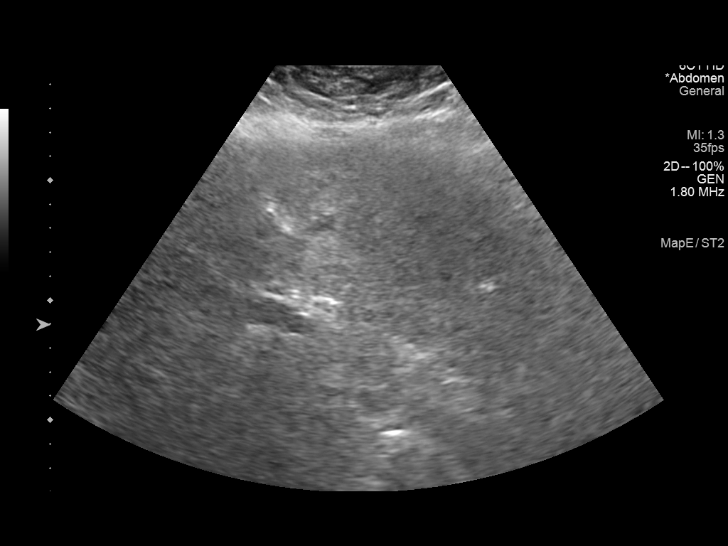

[14 of 25 positions shown; findings below may reference images not displayed]

FINDINGS: Gallbladder:

No gallstones or wall thickening visualized. No sonographic Murphy
sign noted by sonographer. Sludge is seen in the gallbladder.

Common bile duct:

Diameter: 0.8 cm.

Liver:

No focal lesion. The liver appears dense with coarsened echotexture.
Portal vein is patent on color Doppler imaging with normal direction
of blood flow towards the liver.

Other: None.
IMPRESSION: Negative for focal liver lesion.  Changes of cirrhosis again seen.

Gallbladder sludge is new since the prior exam. Negative for
gallstones or evidence of cholecystitis.

Common bile duct measures 0.8 cm compared to 0.3 cm on the prior
examination. There is no intrahepatic biliary ductal dilatation and
the cause for this finding is not identified. Recommend correlation
with liver function tests.

## 2019-08-26 ENCOUNTER — Encounter: Payer: Self-pay | Admitting: *Deleted

## 2019-09-02 ENCOUNTER — Ambulatory Visit: Payer: BC Managed Care – PPO | Admitting: Gastroenterology

## 2019-09-08 ENCOUNTER — Telehealth: Payer: Self-pay | Admitting: Nurse Practitioner

## 2019-09-08 NOTE — Telephone Encounter (Signed)
Unable to leave vm, need more information.

## 2019-09-08 NOTE — Telephone Encounter (Signed)
Patient is calling and wanted to speak to someone about getting a  Referral to see a chiropractor for back and neck pain. CB is [phone]

## 2019-09-09 NOTE — Telephone Encounter (Signed)
Unable to leave vm again.

## 2019-09-10 ENCOUNTER — Telehealth: Payer: Self-pay | Admitting: Nurse Practitioner

## 2019-09-10 NOTE — Telephone Encounter (Signed)
Patient is calling to see if you can send in a RX for ibuprofen 800 mg. Please call patient at [phone].

## 2019-09-10 NOTE — Telephone Encounter (Signed)
LVM for the pt to call back, do not see where we prescribe this rx before. Need more information.

## 2019-09-13 NOTE — Telephone Encounter (Signed)
Patient is returning call. CB is [phone]. Patient is at work an d requested to call @ 2pm

## 2019-09-13 NOTE — Telephone Encounter (Signed)
LVM for the to to call back.

## 2019-09-13 NOTE — Telephone Encounter (Signed)
LVM for the pt to call back on both #.

## 2019-09-16 ENCOUNTER — Ambulatory Visit: Payer: BC Managed Care – PPO | Admitting: Nurse Practitioner

## 2019-09-20 ENCOUNTER — Ambulatory Visit: Payer: BC Managed Care – PPO | Admitting: Nurse Practitioner

## 2019-09-20 ENCOUNTER — Encounter: Payer: Self-pay | Admitting: Nurse Practitioner

## 2019-09-20 ENCOUNTER — Observation Stay (HOSPITAL_COMMUNITY): Payer: BC Managed Care – PPO

## 2019-09-20 ENCOUNTER — Inpatient Hospital Stay (HOSPITAL_COMMUNITY)
Admission: EM | Admit: 2019-09-20 | Discharge: 2019-09-26 | DRG: 423 | Disposition: A | Discharge status: Home / Self Care | Payer: BC Managed Care – PPO | Attending: Family Medicine | Admitting: Family Medicine

## 2019-09-20 ENCOUNTER — Other Ambulatory Visit: Payer: Self-pay

## 2019-09-20 ENCOUNTER — Emergency Department (HOSPITAL_COMMUNITY): Payer: BC Managed Care – PPO

## 2019-09-20 ENCOUNTER — Encounter (HOSPITAL_COMMUNITY): Payer: Self-pay | Admitting: *Deleted

## 2019-09-20 ENCOUNTER — Encounter (HOSPITAL_COMMUNITY): Payer: Self-pay | Admitting: Student

## 2019-09-20 VITALS — BP 128/80 | HR 75 | Temp 97.0°F | Ht 63.0 in | Wt 209.2 lb

## 2019-09-20 DIAGNOSIS — R17 Unspecified jaundice: Secondary | ICD-10-CM

## 2019-09-20 DIAGNOSIS — Z806 Family history of leukemia: Secondary | ICD-10-CM

## 2019-09-20 DIAGNOSIS — F1011 Alcohol abuse, in remission: Secondary | ICD-10-CM | POA: Diagnosis present

## 2019-09-20 DIAGNOSIS — Z8619 Personal history of other infectious and parasitic diseases: Secondary | ICD-10-CM

## 2019-09-20 DIAGNOSIS — C259 Malignant neoplasm of pancreas, unspecified: Secondary | ICD-10-CM | POA: Insufficient documentation

## 2019-09-20 DIAGNOSIS — M109 Gout, unspecified: Secondary | ICD-10-CM | POA: Diagnosis present

## 2019-09-20 DIAGNOSIS — R945 Abnormal results of liver function studies: Secondary | ICD-10-CM | POA: Diagnosis present

## 2019-09-20 DIAGNOSIS — Z8719 Personal history of other diseases of the digestive system: Secondary | ICD-10-CM | POA: Diagnosis not present

## 2019-09-20 DIAGNOSIS — K3189 Other diseases of stomach and duodenum: Secondary | ICD-10-CM | POA: Diagnosis present

## 2019-09-20 DIAGNOSIS — K8689 Other specified diseases of pancreas: Secondary | ICD-10-CM

## 2019-09-20 DIAGNOSIS — K259 Gastric ulcer, unspecified as acute or chronic, without hemorrhage or perforation: Secondary | ICD-10-CM | POA: Diagnosis present

## 2019-09-20 DIAGNOSIS — I8510 Secondary esophageal varices without bleeding: Secondary | ICD-10-CM | POA: Diagnosis present

## 2019-09-20 DIAGNOSIS — R42 Dizziness and giddiness: Secondary | ICD-10-CM | POA: Diagnosis not present

## 2019-09-20 DIAGNOSIS — Z8249 Family history of ischemic heart disease and other diseases of the circulatory system: Secondary | ICD-10-CM

## 2019-09-20 DIAGNOSIS — K7460 Unspecified cirrhosis of liver: Secondary | ICD-10-CM | POA: Diagnosis not present

## 2019-09-20 DIAGNOSIS — Z7982 Long term (current) use of aspirin: Secondary | ICD-10-CM

## 2019-09-20 DIAGNOSIS — E669 Obesity, unspecified: Secondary | ICD-10-CM | POA: Diagnosis present

## 2019-09-20 DIAGNOSIS — E782 Mixed hyperlipidemia: Secondary | ICD-10-CM | POA: Diagnosis present

## 2019-09-20 DIAGNOSIS — K7030 Alcoholic cirrhosis of liver without ascites: Secondary | ICD-10-CM | POA: Diagnosis present

## 2019-09-20 DIAGNOSIS — E871 Hypo-osmolality and hyponatremia: Secondary | ICD-10-CM | POA: Diagnosis present

## 2019-09-20 DIAGNOSIS — Z833 Family history of diabetes mellitus: Secondary | ICD-10-CM

## 2019-09-20 DIAGNOSIS — R748 Abnormal levels of other serum enzymes: Secondary | ICD-10-CM | POA: Diagnosis not present

## 2019-09-20 DIAGNOSIS — Z86010 Personal history of colonic polyps: Secondary | ICD-10-CM

## 2019-09-20 DIAGNOSIS — E878 Other disorders of electrolyte and fluid balance, not elsewhere classified: Secondary | ICD-10-CM | POA: Diagnosis present

## 2019-09-20 DIAGNOSIS — K766 Portal hypertension: Secondary | ICD-10-CM | POA: Diagnosis present

## 2019-09-20 DIAGNOSIS — E119 Type 2 diabetes mellitus without complications: Secondary | ICD-10-CM

## 2019-09-20 DIAGNOSIS — Z20822 Contact with and (suspected) exposure to covid-19: Secondary | ICD-10-CM | POA: Diagnosis present

## 2019-09-20 DIAGNOSIS — Z79899 Other long term (current) drug therapy: Secondary | ICD-10-CM

## 2019-09-20 DIAGNOSIS — K2950 Unspecified chronic gastritis without bleeding: Secondary | ICD-10-CM | POA: Diagnosis present

## 2019-09-20 DIAGNOSIS — C250 Malignant neoplasm of head of pancreas: Principal | ICD-10-CM | POA: Diagnosis present

## 2019-09-20 DIAGNOSIS — Z6837 Body mass index (BMI) 37.0-37.9, adult: Secondary | ICD-10-CM | POA: Diagnosis not present

## 2019-09-20 DIAGNOSIS — Z87891 Personal history of nicotine dependence: Secondary | ICD-10-CM

## 2019-09-20 DIAGNOSIS — Z888 Allergy status to other drugs, medicaments and biological substances status: Secondary | ICD-10-CM

## 2019-09-20 DIAGNOSIS — E1165 Type 2 diabetes mellitus with hyperglycemia: Secondary | ICD-10-CM

## 2019-09-20 DIAGNOSIS — E876 Hypokalemia: Secondary | ICD-10-CM | POA: Diagnosis present

## 2019-09-20 DIAGNOSIS — I10 Essential (primary) hypertension: Secondary | ICD-10-CM | POA: Diagnosis present

## 2019-09-20 DIAGNOSIS — K828 Other specified diseases of gallbladder: Secondary | ICD-10-CM | POA: Diagnosis present

## 2019-09-20 DIAGNOSIS — K831 Obstruction of bile duct: Secondary | ICD-10-CM | POA: Diagnosis present

## 2019-09-20 DIAGNOSIS — K769 Liver disease, unspecified: Secondary | ICD-10-CM | POA: Diagnosis not present

## 2019-09-20 DIAGNOSIS — Z7984 Long term (current) use of oral hypoglycemic drugs: Secondary | ICD-10-CM

## 2019-09-20 DIAGNOSIS — D630 Anemia in neoplastic disease: Secondary | ICD-10-CM | POA: Diagnosis present

## 2019-09-20 DIAGNOSIS — K269 Duodenal ulcer, unspecified as acute or chronic, without hemorrhage or perforation: Secondary | ICD-10-CM | POA: Diagnosis present

## 2019-09-20 DIAGNOSIS — K228 Other specified diseases of esophagus: Secondary | ICD-10-CM | POA: Diagnosis present

## 2019-09-20 DIAGNOSIS — R935 Abnormal findings on diagnostic imaging of other abdominal regions, including retroperitoneum: Secondary | ICD-10-CM | POA: Diagnosis not present

## 2019-09-20 DIAGNOSIS — C772 Secondary and unspecified malignant neoplasm of intra-abdominal lymph nodes: Secondary | ICD-10-CM

## 2019-09-20 LAB — BASIC METABOLIC PANEL
BUN: 11 mg/dL (ref 6–23)
CO2: 26 mEq/L (ref 19–32)
Calcium: 9.4 mg/dL (ref 8.4–10.5)
Chloride: 92 mEq/L — ABNORMAL LOW (ref 96–112)
Creatinine, Ser: 0.98 mg/dL (ref 0.40–1.20)
GFR: 69.47 mL/min (ref >60.00)
Glucose, Bld: 111 mg/dL — ABNORMAL HIGH (ref 70–99)
Potassium: 3.1 mEq/L — ABNORMAL LOW (ref 3.5–5.1)
Sodium: 129 mEq/L — ABNORMAL LOW (ref 135–145)

## 2019-09-20 LAB — URINALYSIS, ROUTINE W REFLEX MICROSCOPIC
Glucose, UA: NEGATIVE mg/dL
Hgb urine dipstick: NEGATIVE
Ketones, ur: NEGATIVE mg/dL
Leukocytes,Ua: NEGATIVE
Nitrite: NEGATIVE
Protein, ur: 30 mg/dL — AB
Specific Gravity, Urine: 1.021 (ref 1.005–1.030)
pH: 5 (ref 5.0–8.0)

## 2019-09-20 LAB — CBC WITH DIFFERENTIAL/PLATELET
Basophils Absolute: 0.1 10*3/uL (ref 0.0–0.1)
Basophils Relative: 1.2 % (ref 0.0–3.0)
Eosinophils Absolute: 0.1 10*3/uL (ref 0.0–0.7)
Eosinophils Relative: 0.6 % (ref 0.0–5.0)
HCT: 33.9 % — ABNORMAL LOW (ref 36.0–46.0)
Hemoglobin: 11.5 g/dL — ABNORMAL LOW (ref 12.0–15.0)
Lymphocytes Relative: 25.5 % (ref 12.0–46.0)
Lymphs Abs: 3 10*3/uL (ref 0.7–4.0)
MCHC: 33.9 g/dL (ref 30.0–36.0)
MCV: 89.1 fl (ref 78.0–100.0)
Monocytes Absolute: 0.8 10*3/uL (ref 0.1–1.0)
Monocytes Relative: 6.6 % (ref 3.0–12.0)
Neutro Abs: 7.8 10*3/uL — ABNORMAL HIGH (ref 1.4–7.7)
Neutrophils Relative %: 66.1 % (ref 43.0–77.0)
Platelets: 415 10*3/uL — ABNORMAL HIGH (ref 150.0–400.0)
RBC: 3.81 Mil/uL — ABNORMAL LOW (ref 3.87–5.11)
RDW: 15.7 % — ABNORMAL HIGH (ref 11.5–15.5)
WBC: 11.8 10*3/uL — ABNORMAL HIGH (ref 4.0–10.5)

## 2019-09-20 LAB — HEPATIC FUNCTION PANEL
ALT: 157 U/L — ABNORMAL HIGH (ref 0–35)
AST: 248 U/L — ABNORMAL HIGH (ref 0–37)
Albumin: 3.4 g/dL — ABNORMAL LOW (ref 3.5–5.2)
Alkaline Phosphatase: 601 U/L — ABNORMAL HIGH (ref 39–117)
Bilirubin, Direct: 17.3 mg/dL — ABNORMAL HIGH (ref 0.0–0.3)
Total Bilirubin: 23.7 mg/dL — ABNORMAL HIGH (ref 0.2–1.2)
Total Protein: 7.4 g/dL (ref 6.0–8.3)

## 2019-09-20 LAB — GLUCOSE, CAPILLARY: Glucose-Capillary: 111 mg/dL — ABNORMAL HIGH (ref 70–99)

## 2019-09-20 LAB — HEMOGLOBIN A1C: Hgb A1c MFr Bld: 6.7 % — ABNORMAL HIGH (ref 4.6–6.5)

## 2019-09-20 LAB — PROTIME-INR
INR: 1.3 ratio — ABNORMAL HIGH (ref 0.8–1.0)
Prothrombin Time: 14.2 s — ABNORMAL HIGH (ref 9.6–13.1)

## 2019-09-20 IMAGING — MR MR ABDOMEN WO/W CM MRCP
9 of 20 series · 18 of 48 positions shown · IV contrast (gadavist)
Comparison: Abdominal sonogram of [DATE]

CLINICAL DATA: Jaundice.

EXAM:
MRI ABDOMEN WITHOUT AND WITH CONTRAST (INCLUDING MRCP)
TECHNIQUE: Multiplanar multisequence MR imaging of the abdomen was performed
both before and after the administration of intravenous contrast.
Heavily T2-weighted images of the biliary and pancreatic ducts were
obtained, and three-dimensional MRCP images were rendered by post
processing.
CONTRAST:  10mL GADAVIST GADOBUTROL 1 MMOL/ML IV SOLN

[Series 4: T2 fat-sat · axial · 5.0mm · 0.86mm/px · 1 of 54 slices shown]
[im 1/54]
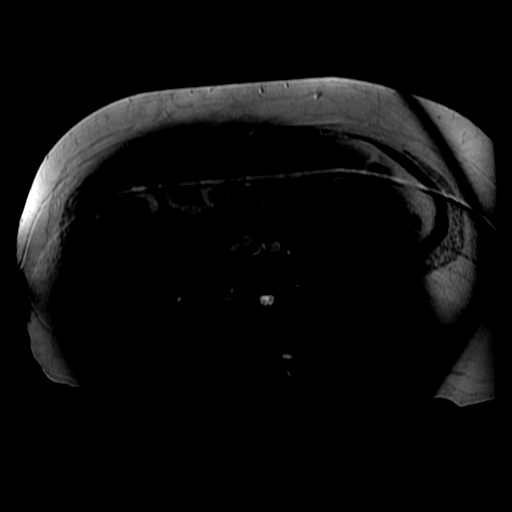

[Series 6: MRCP · coronal · 2.0mm · 0.70mm/px · 2 of 55 slices shown (1 of 2)]
[im 1/55]
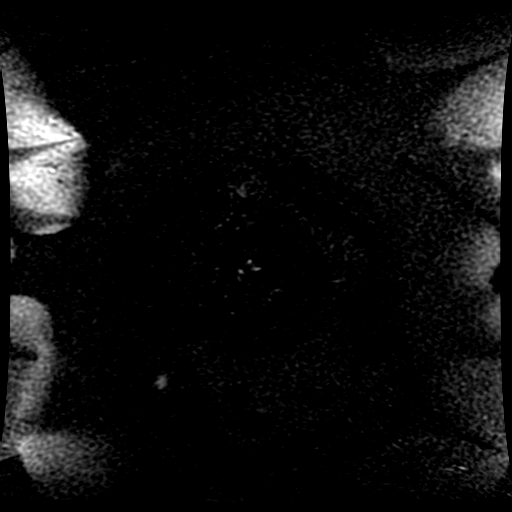
[im 55/55]
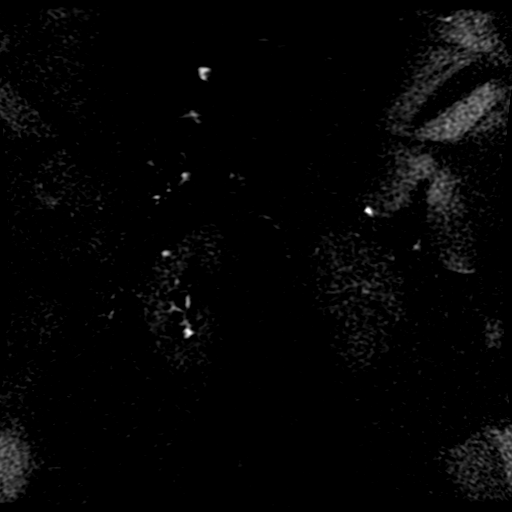

[Series 7: DWI b500 · axial · 6.0mm · 1.76mm/px · z∈[-234,+62]mm · 2 of 78 slices shown]
[im 1/78]
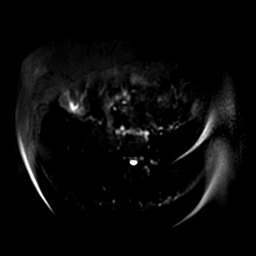
[im 78/78]
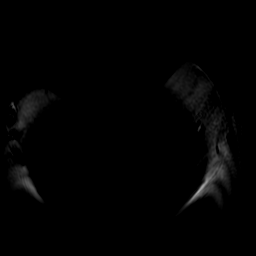

[Series 8: T2 · coronal · 5.0mm · 0.86mm/px · 1 of 45 slices shown (1 of 2)]
[im 1/45]
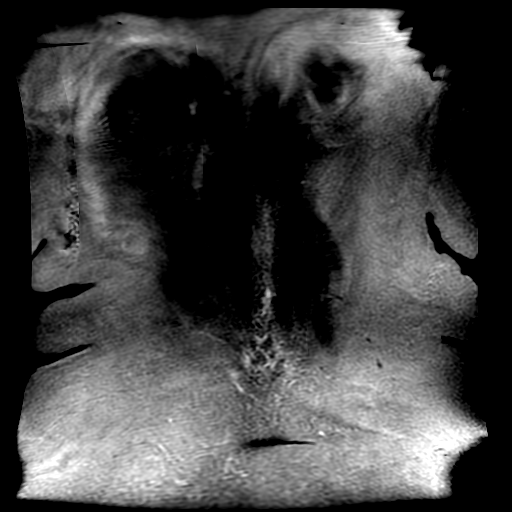

[Series 9: T2 · axial · 5.0mm · 0.90mm/px · z∈[-236,+64]mm · 2 of 61 slices shown (2 of 2)]
[im 1/61]
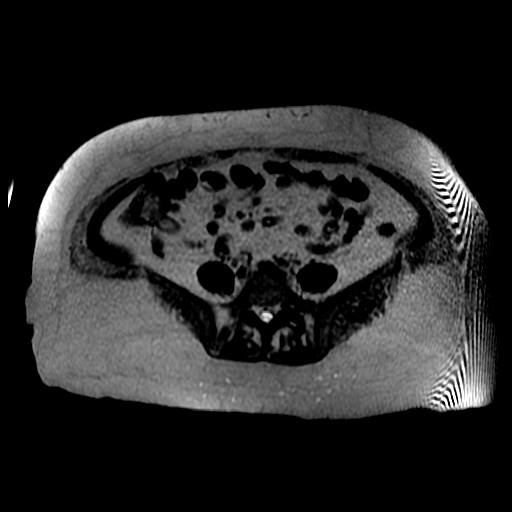
[im 61/61]
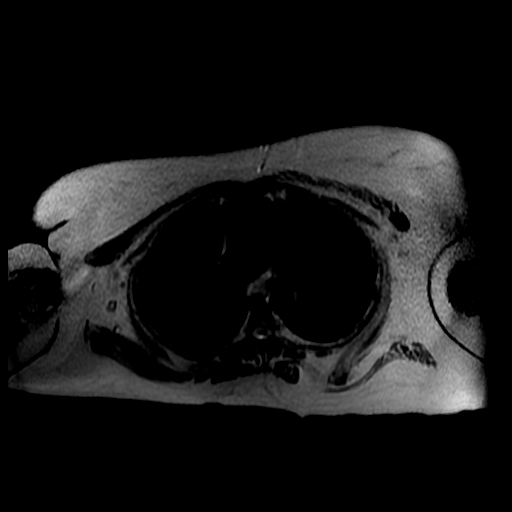

[Series 10: ax dualecho bh · axial · 5.0mm · 0.90mm/px · z∈[-236,+64]mm · 4 of 122 slices shown]
[im 1/122]
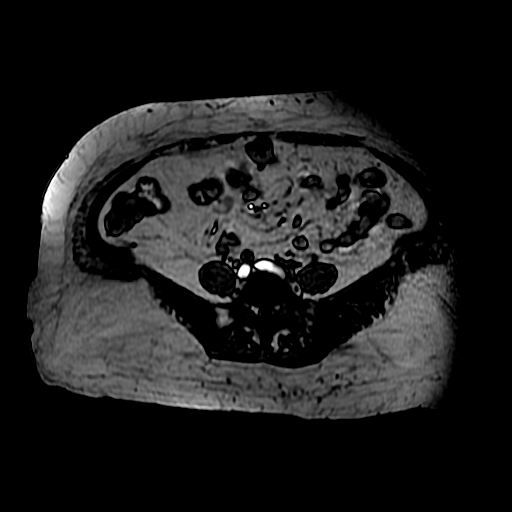
[im 41/122]
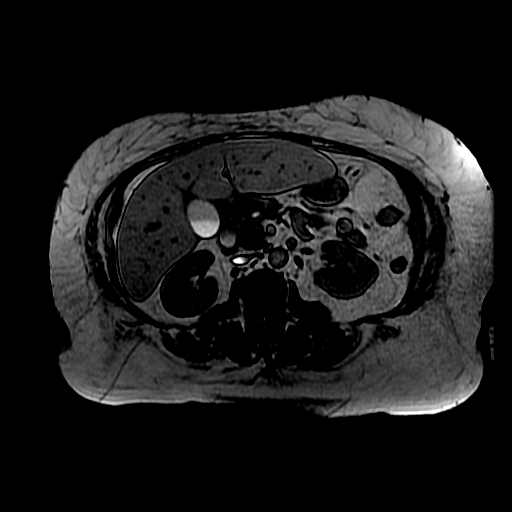
[im 81/122]
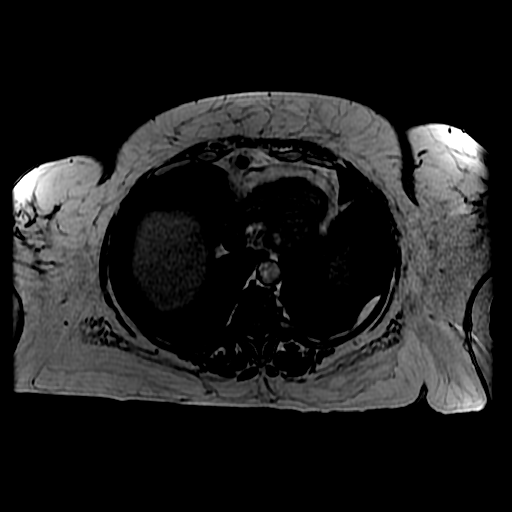
[im 122/122]
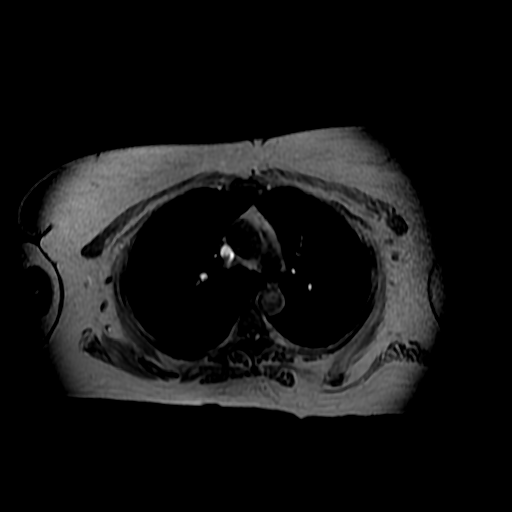

[Series 11: MRCP · coronal · 40.0mm · 0.70mm/px · 1 of 9 slices shown (2 of 2)]
[im 1/9]
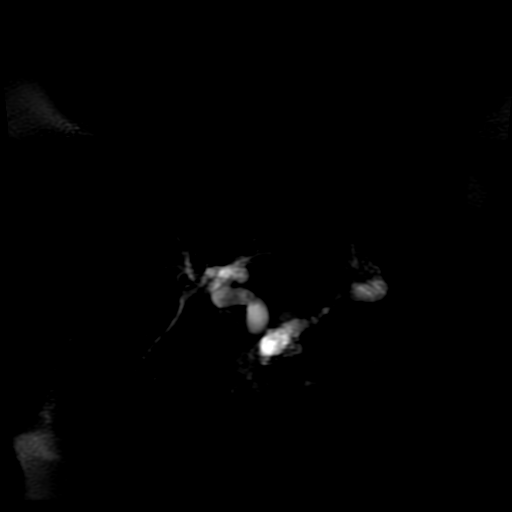

[Series 14: T1 dynamic · coronal · delayed · 6.0mm · 0.82mm/px · 2 of 72 slices shown (1 of 2)]
[im 1/72]
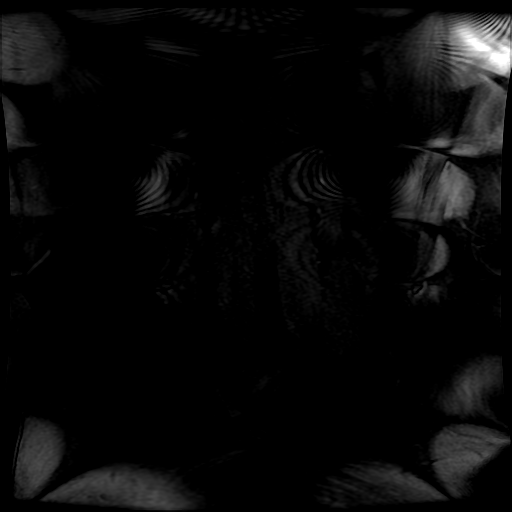
[im 72/72]
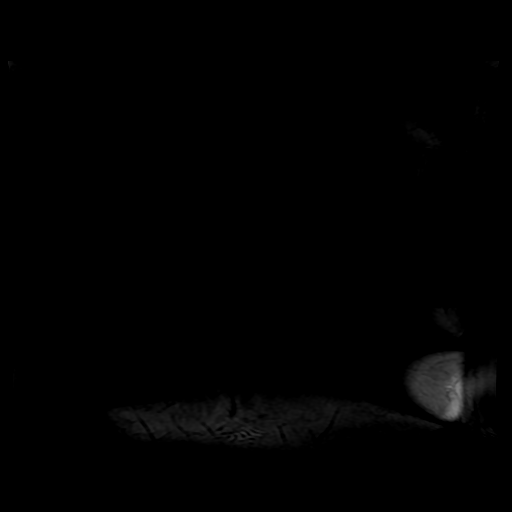

[Series 15: T1 dynamic · axial · 6.0mm · 0.78mm/px · z∈[-229,+44]mm · 3 of 92 slices shown (2 of 2)]
[im 1/92]
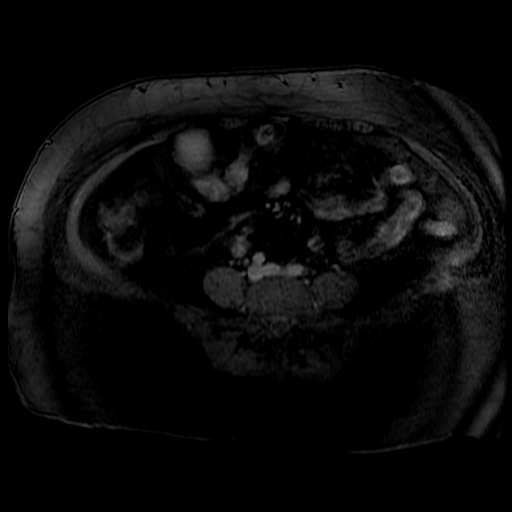
[im 46/92]
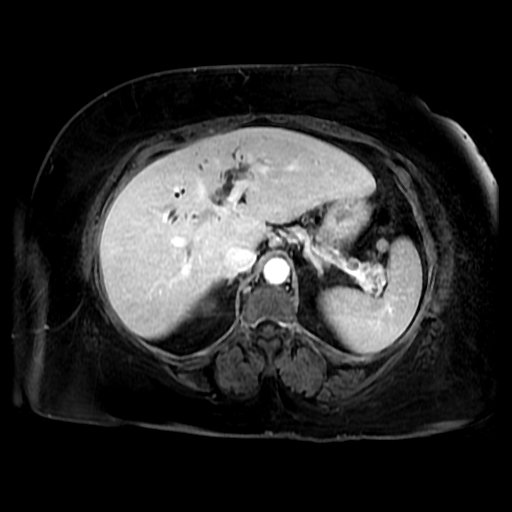
[im 92/92]
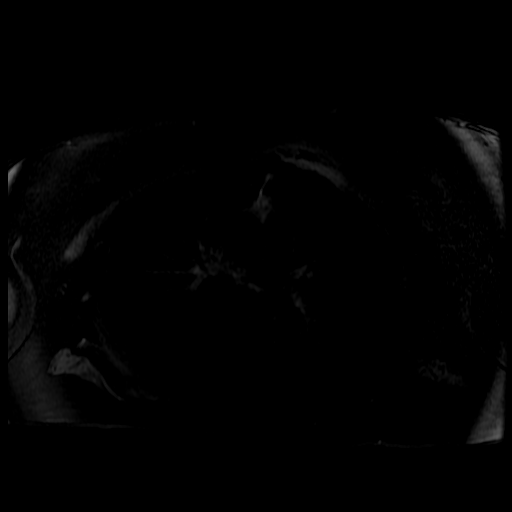

[18 of 48 positions shown; findings below may reference images not displayed]

FINDINGS: Lower chest: Limited assessment of the lower chest shows no effusion
or consolidation. Study limited by patient body habitus overall. 13
mm pre pericardial lymph node is nonspecific. There is also a
suggestion of irregularity of soft tissues in the left left
supraclavicular region. This could represent nodal disease given
findings below.

Hepatobiliary: Signs of irregular filling defect in the duodenum,
associated with biliary and pancreatic ductal dilation and
pancreatic atrophy. Common bile duct is largest 15 mm with marked
distension of the intrahepatic biliary tree. Common bile duct with
tapered narrowing to the level of the ampulla.

Sludge in the dependent gallbladder and small stones. Also likely
with sludge in the common bile duct and dependent low signal
intensity on reconstructed MRCP sequences that suggests either small
stones or biliary sludge in the dependent portion of the distal
common bile duct. No visible intrahepatic calculi.

Pancreas: Marked dilation of dorsal pancreatic duct with signs of
pancreatic atrophy in intraductal calculi suggested on T2 and MRCP
sequences some as large is a cm and mainly present near the ductal
confluence. Ampullary filling defect as outlined above. Ductal
caliber in maximum axial dimension 14 mm of the pancreatic duct.

In addition to extension into the ampulla there is abnormal soft
tissue involving the pancreatic head best seen on subtraction
images, image 74 of postcontrast data measuring approximately 4.4 x
3.2 cm in greatest axial dimension including extension into the
lumen of the duodenum.

Spleen:  Spleen is normal size without suspicious lesion.

Adrenals/Urinary Tract: Adrenal glands are normal. Small part solid
and enhancing renal lesion arises from the lower pole of the right
kidney measuring 11 x 9 mm. No signs of hydronephrosis.

Stomach/Bowel: No sign of bowel obstruction. Masslike area in the
region of the ampulla filling the ampullary portion of the duodenum
may measure as large as 3.5 x 3.1 cm in greatest axial dimension and
results in ductal obstruction of both pancreatic and biliary duct.
Lobular contour of the pancreatic tail with heterogeneity, question
of small lesion in this location as well 1.3 cm (image 49, venous
phase data set, postcontrast images.

Vascular/Lymphatic: Bulky periportal lymph nodes suspicious for
neoplastic/metastatic lymph nodes based on location in the
appearance of the duodenum. Also with enlarged hepatic gastric lymph
nodes. (Image 59, post-contrast images) 1.4 cm portacaval lymph node

(Image 46, post-contrast images 9 mm hepatic gastric lymph node.

Other: Portal vein and SMV are patent. SMA is patent. No signs of
soft tissue in case mint of vascular structures in the upper
abdomen.

Musculoskeletal: No suspicious bone lesions identified.
IMPRESSION: 1. Signs of pancreatic/ampullary mass with extension into duodenum
and obstruction of biliary and pancreatic ducts. Findings suspicious
for either ampullary adenocarcinoma or pancreatic adenocarcinoma.
Endoscopic assessment is suggested.
2. Signs of pancreatic atrophy with lobular area in the pancreatic
tail the raise the question of second small lesion versus is
heterogeneous pancreas with signs of prior inflammation.
3. Biliary and pancreatic ductal obstruction with sludge and/or
small stones layering dependently in the common bile duct and
intraductal calculi suspected in the dilated pancreatic duct near
the biliary ductal and pancreatic ductal confluence. Some calculi
may be as large as 1 cm.
4. Signs of nodal enlargement in the upper abdomen and even in lower
chest raising the question of nodal involvement.
5. Query left supraclavicular nodal enlargement, chest imaging may
be helpful given findings above.
6. Small solid and enhancing lesion in the lower pole of the right
kidney. Recommend attention on follow-up imaging. This
recommendation follows ACR consensus guidelines: White Paper of the
ACR Incidental Findings Committee II on Vascular Findings. [HOSPITAL] [CF]; [DATE].
7. These results will be called to the ordering clinician or
representative by the Radiologist Assistant, and communication
documented in the PACS or zVision Dashboard.

## 2019-09-20 IMAGING — US US ABDOMEN LIMITED
1 series · 13 of 25 positions shown · non-contrast
Comparison: Ultrasound dated [DATE].

CLINICAL DATA: 63-year-old female with elevated bilirubin.

EXAM:
ULTRASOUND ABDOMEN LIMITED RIGHT UPPER QUADRANT

[Series 1: us abdomen limited · 13 of 66 slices shown]
[im 1/66]
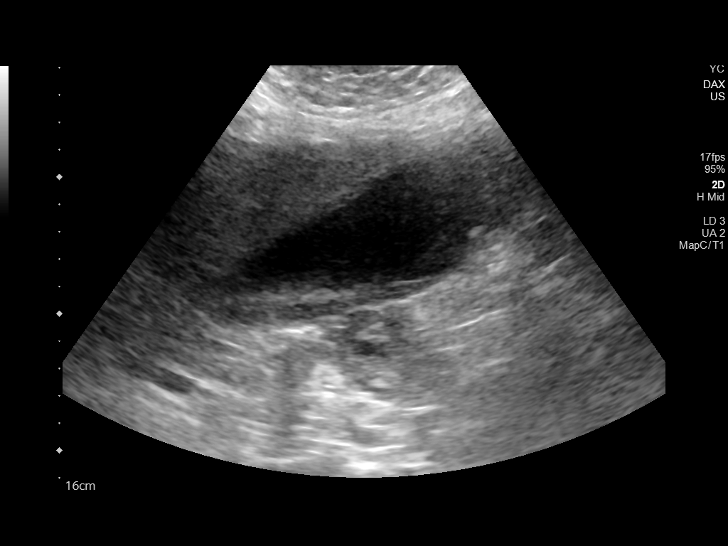
[im 6/66]
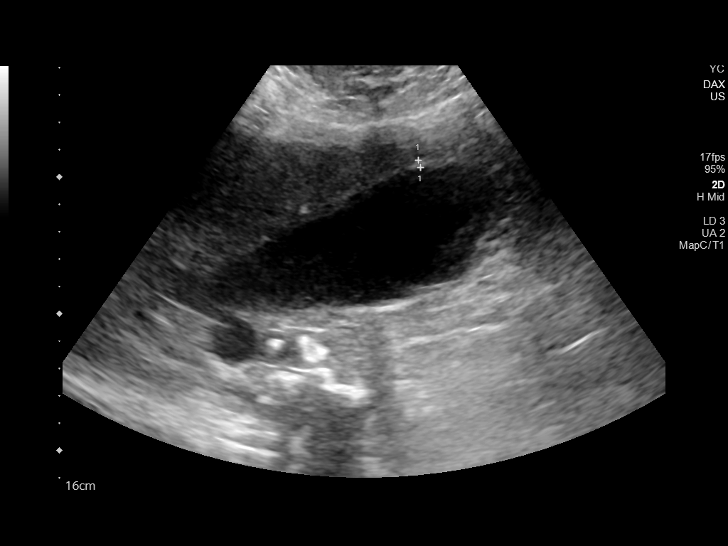
[im 11/66]
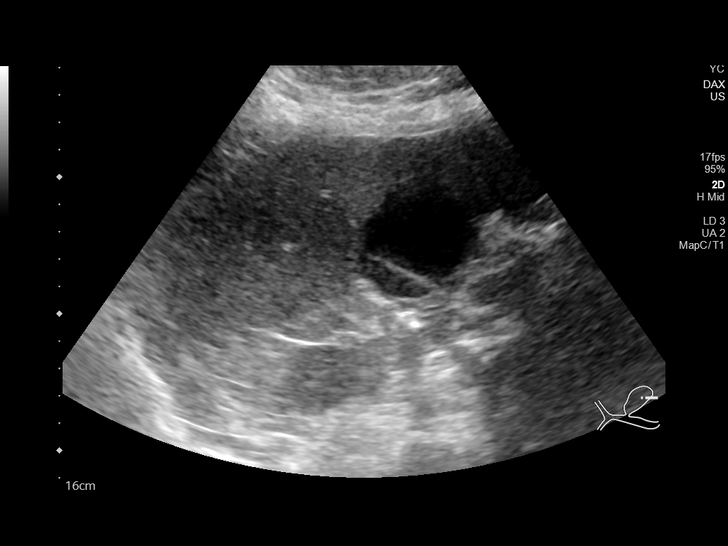
[im 17/66]
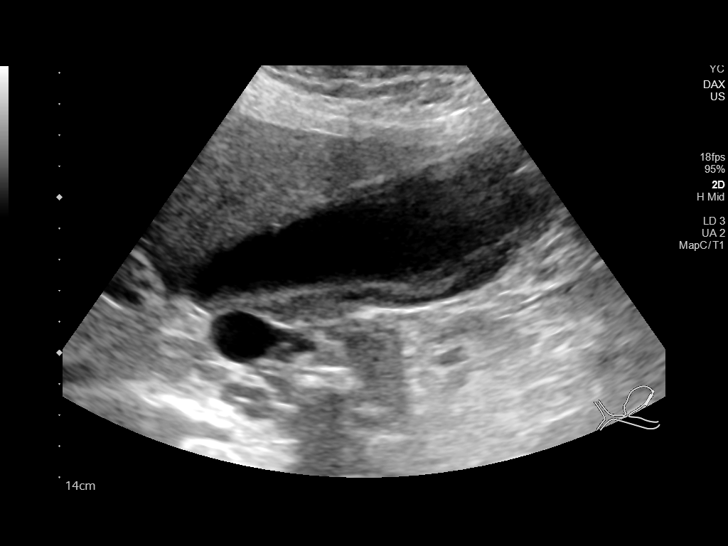
[im 22/66]
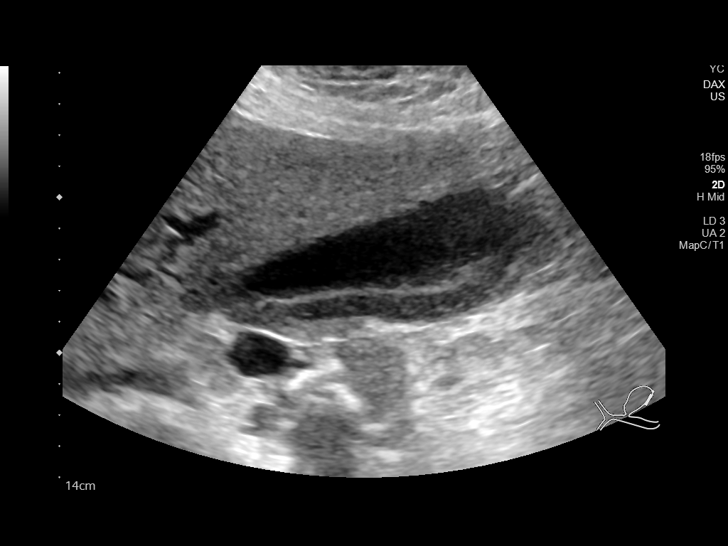
[im 28/66]
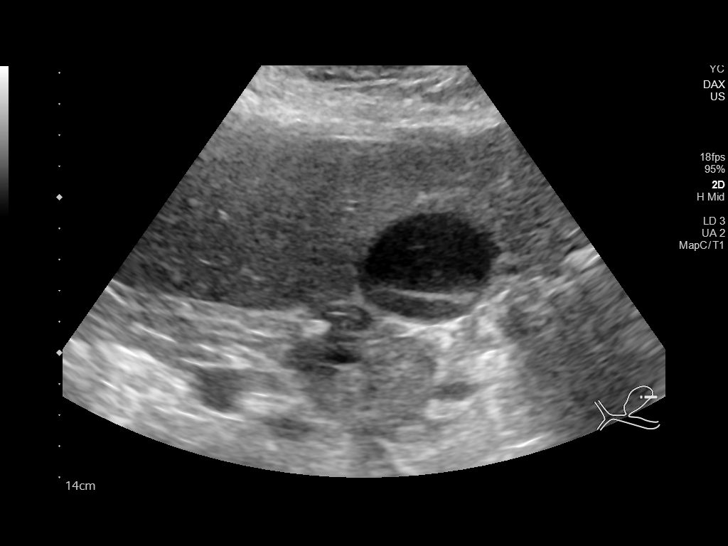
[im 33/66]
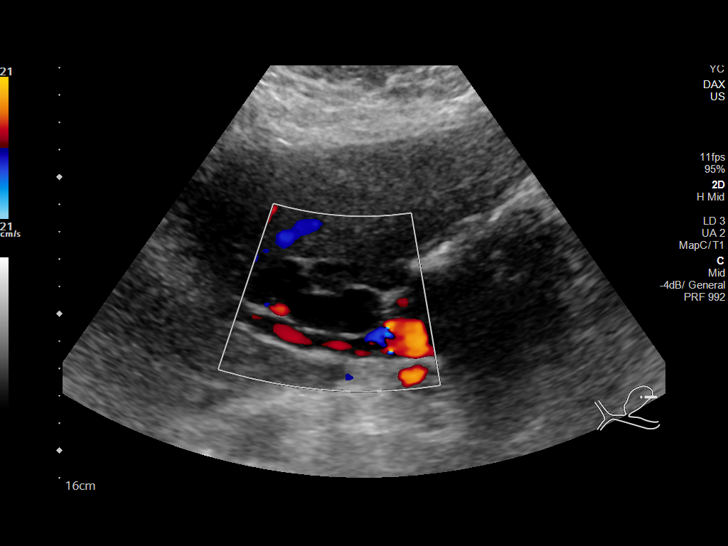
[im 38/66]
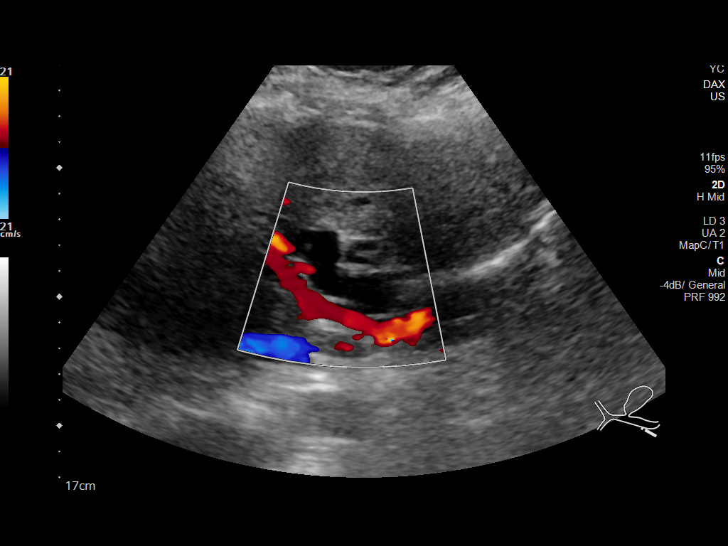
[im 44/66]
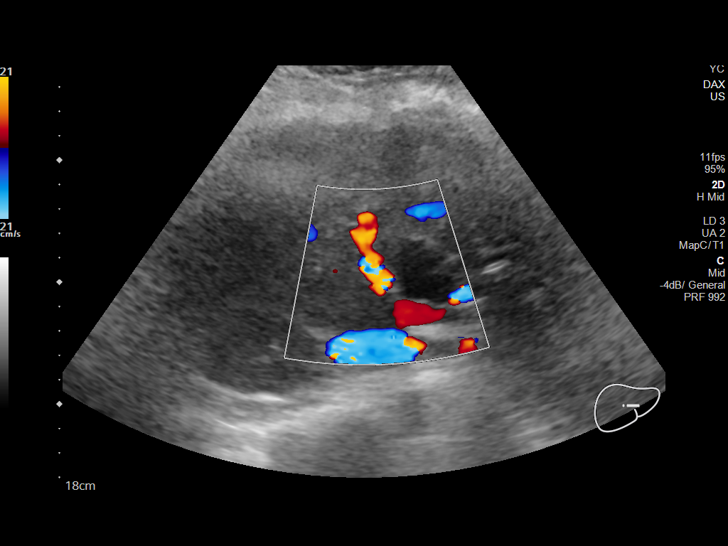
[im 49/66]
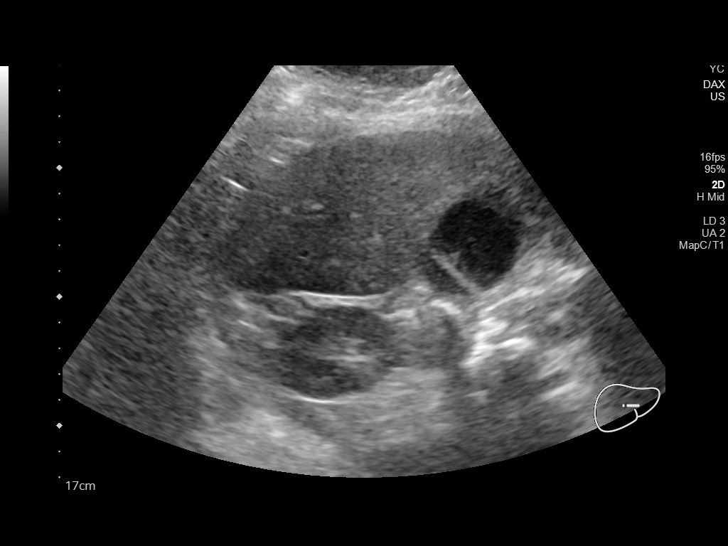
[im 55/66]
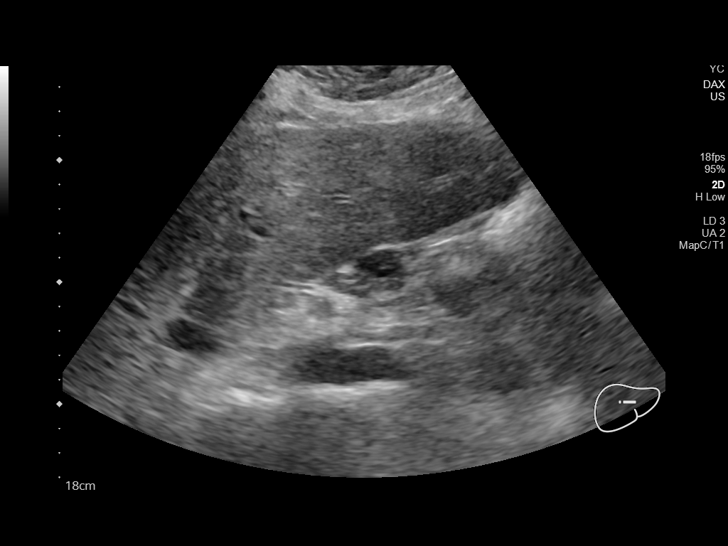
[im 60/66]
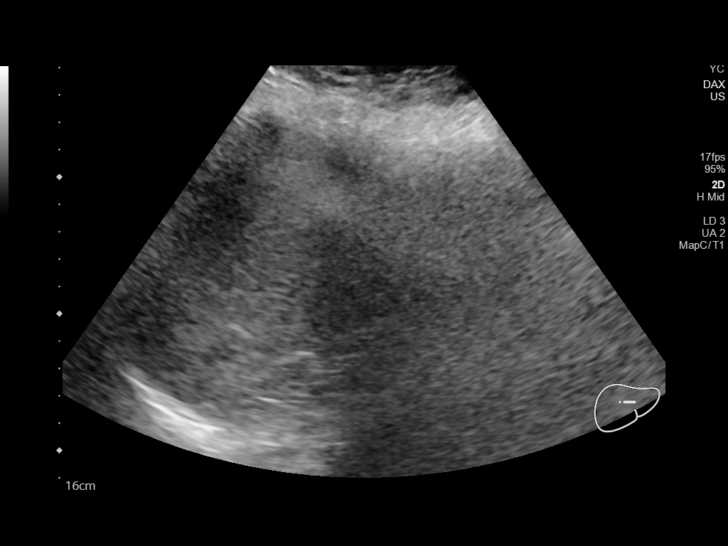
[im 66/66]
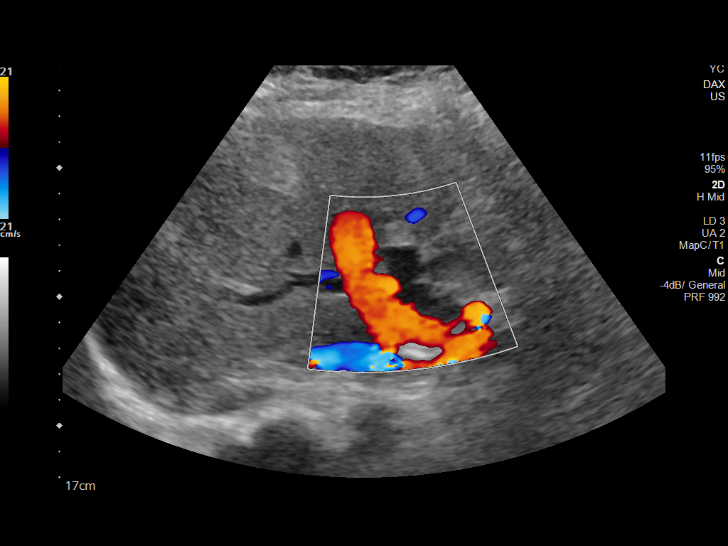

[13 of 25 positions shown; findings below may reference images not displayed]

FINDINGS: Gallbladder:

There is sludge within the gallbladder. No definite shadowing stone,
gallbladder wall thickening, or pericholecystic fluid. Negative
sonographic Murphy's sign.

Common bile duct:

Diameter: 16 mm. Interval increase in the size of the common bile
duct as well as development of mild intrahepatic biliary ductal
dilatation since the prior ultrasound. Correlation with clinical
exam, and LFTs recommended. MRCP may provide better evaluation.

Liver:

There is heterogeneously increased liver echogenicity most commonly
seen in the setting of fatty infiltration. Superimposed inflammation
or fibrosis is not excluded. Clinical correlation is recommended.
Portal vein is patent on color Doppler imaging with normal direction
of blood flow towards the liver.

Other: None.
IMPRESSION: 1. Moderate amount of gallbladder sludge. No sonographic findings of
acute cholecystitis.
2. Dilatation of the common bile duct, progressed since the prior
ultrasound with interval development of mild intrahepatic biliary
ductal dilatation. Clinical correlation is recommended. MRCP may
provide better evaluation.
3. Probable fatty liver or changes of cirrhosis.

## 2019-09-20 MED ORDER — AMLODIPINE BESYLATE 10 MG PO TABS
10.0000 mg | ORAL_TABLET | Freq: Every day | ORAL | Status: DC
  Administered 2019-09-21 – 2019-09-25 (×5): 10 mg via ORAL
  Filled 2019-09-20 (×6): qty 1

## 2019-09-20 MED ORDER — GADOBUTROL 1 MMOL/ML IV SOLN
10.0000 mL | Freq: Once | INTRAVENOUS | Status: AC | PRN
  Administered 2019-09-20: 10 mL via INTRAVENOUS

## 2019-09-20 MED ORDER — IRBESARTAN 75 MG PO TABS
37.5000 mg | ORAL_TABLET | Freq: Every day | ORAL | Status: DC
  Administered 2019-09-21 – 2019-09-26 (×6): 37.5 mg via ORAL
  Filled 2019-09-20 (×7): qty 1

## 2019-09-20 MED ORDER — INSULIN ASPART 100 UNIT/ML ~~LOC~~ SOLN
0.0000 [IU] | SUBCUTANEOUS | Status: DC
  Administered 2019-09-22 (×2): 3 [IU] via SUBCUTANEOUS
  Administered 2019-09-22: 1 [IU] via SUBCUTANEOUS
  Administered 2019-09-22: 2 [IU] via SUBCUTANEOUS
  Administered 2019-09-23: 1 [IU] via SUBCUTANEOUS
  Administered 2019-09-23: 3 [IU] via SUBCUTANEOUS
  Administered 2019-09-23 (×2): 2 [IU] via SUBCUTANEOUS
  Administered 2019-09-23: 5 [IU] via SUBCUTANEOUS
  Administered 2019-09-24: 1 [IU] via SUBCUTANEOUS
  Administered 2019-09-24 (×2): 2 [IU] via SUBCUTANEOUS
  Administered 2019-09-24: 1 [IU] via SUBCUTANEOUS
  Administered 2019-09-24: 2 [IU] via SUBCUTANEOUS
  Administered 2019-09-24: 1 [IU] via SUBCUTANEOUS
  Administered 2019-09-25: 2 [IU] via SUBCUTANEOUS
  Administered 2019-09-25: 1 [IU] via SUBCUTANEOUS
  Administered 2019-09-25 (×3): 2 [IU] via SUBCUTANEOUS
  Administered 2019-09-25 – 2019-09-26 (×3): 1 [IU] via SUBCUTANEOUS
  Administered 2019-09-26: 2 [IU] via SUBCUTANEOUS

## 2019-09-20 MED ORDER — SODIUM CHLORIDE 0.9 % IV SOLN: INTRAVENOUS | Status: AC

## 2019-09-20 MED ORDER — METOPROLOL SUCCINATE ER 25 MG PO TB24
25.0000 mg | ORAL_TABLET | Freq: Every day | ORAL | Status: DC
  Administered 2019-09-21 – 2019-09-26 (×6): 25 mg via ORAL
  Filled 2019-09-20 (×7): qty 1

## 2019-09-20 NOTE — Progress Notes (Signed)
RN called NP with radiology results. NP asked RN to page Dr. Maudie Mercury with [facility] because he is admitting patient and has not written a H&P yet. RN agreed. KJKG, NP [facility]

## 2019-09-20 NOTE — ED Provider Notes (Addendum)
[facility] DEPT Provider Note   CSN: [id number] Arrival date & time: 09/20/19  1603     History Chief Complaint  Patient presents with  . Abnormal Lab    Melanie Alvarez is a 64 y.o. female with a history of hypertension, hyperlipidemia, diabetes mellitus, hepatitis C s/p tx, cirrhosis, & prior EtOH use in remission who presents to the ED with complaints of abnormal outpatient labs. She states she was seen by her PCP today for a routine check up and was found to have abnormal liver function tests & and was told to come to the ED. In terms of sxs she states she feels generally okay, states that she has had a couple of weeks of jaundice, poor appetite, and dark urine. No acute change today. No alleviating/aggravating factors. Had labs drawn and was told they were abnormal & to come to the ED. She has not yet seen GI, suppose to be seen 02/16. Denies fever, chills, abdominal pain, N/V/D, pale stools, diarrhea, melena, hematochezia, or dysuria. She quit drinking 1.5 months ago. Denies recent travel. Denies use of tylenol.    HPI     Past Medical History:  Diagnosis Date  . Cirrhosis ([place])   . Diabetes mellitus 2012  . Diverticulosis   . Gallbladder sludge   . Hepatitis 2010   history of Hepatitis C  . Hepatitis C   . Hypertension 2011  . Obesity     Patient Active Problem List   Diagnosis Date Noted  . Alcohol cessation counseling 08/11/2019  . Exophthalmos of left eye 05/25/2019  . Hyperuricemia 10/06/2018  . Mixed hyperlipidemia 04/06/2018  . Cirrhosis of liver ([place]) 12/26/2017  . Need for prophylactic vaccination and inoculation against viral hepatitis 12/26/2017  . Gouty arthritis 10/20/2017  . Hypokalemia 10/20/2017  . Hypertension 08/13/2012  . DM (diabetes mellitus) ([place]) 08/13/2012  . Tobacco abuse 08/13/2012  . Chronic hepatitis C without hepatic coma ([place]) 08/13/2012  . Obesity, morbid, BMI 40.0-49.9 ([place]) 08/13/2012    Past  Surgical History:  Procedure Laterality Date  . COLONOSCOPY  07/24/2011   Procedure: COLONOSCOPY;  Surgeon: Landry Dyke, MD;  Location: [facility] ENDOSCOPY;  Service: Endoscopy;  Laterality: N/A;  . Dental Surgery Tooth Extraction       OB History    Gravida  3   Para  1   Term  1   Preterm      AB  2   Living  1     SAB  2   TAB      Ectopic      Multiple      Live Births              Family History  Problem Relation Age of Onset  . Diabetes Sister   . Cancer Maternal Grandmother        leukemia   . Diabetes Maternal Grandfather   . Diabetes Paternal Grandfather   . Diabetes Paternal Grandmother   . Hypertension Sister   . Hypertension Brother   . Anesthesia problems Neg Hx     Social History   Tobacco Use  . Smoking status: Current Some Day Smoker    Packs/day: 0.50    Types: Cigarettes  . Smokeless tobacco: Never Used  Substance Use Topics  . Alcohol use: Yes    Alcohol/week: 30.0 standard drinks    Types: 24 Cans of beer, 6 Shots of liquor per week    Comment: weekends  . Drug  use: No    Comment: previously     Home Medications Prior to Admission medications   Medication Sig Start Date End Date Taking? Authorizing Provider  allopurinol (ZYLOPRIM) 100 MG tablet Take 1 tablet (100 mg total) by mouth 3 (three) times daily after meals. 05/26/19   Nche, Charlene Brooke, NP  amLODipine (NORVASC) 10 MG tablet Take 1 tablet (10 mg total) by mouth at bedtime. 05/26/19   Nche, Charlene Brooke, NP  aspirin 81 MG chewable tablet Chew 81 mg by mouth daily.    [provider]  atorvastatin (LIPITOR) 20 MG tablet Take 1 tablet (20 mg total) by mouth daily at 6 PM. 05/26/19   Nche, Charlene Brooke, NP  cholecalciferol (VITAMIN D) 400 UNITS TABS tablet Take 400 Units by mouth daily.    [provider]  ciprofloxacin-dexamethasone (CIPRODEX) OTIC suspension  09/16/19   [provider]  glucose blood test strip Test blood glucose once daily.  One Touch-Delica test strips 5/63/14   Nche, Charlene Brooke, NP  Lecithin 1200 MG CAPS Take by mouth.    [provider]  metFORMIN (GLUCOPHAGE) 500 MG tablet Take 1 tablet (500 mg total) by mouth 2 (two) times daily with a meal. 05/26/19   Nche, Charlene Brooke, NP  metoprolol succinate (TOPROL-XL) 25 MG 24 hr tablet Take 1 tablet (25 mg total) by mouth daily. Take with or immediately following a meal. 05/26/19   Nche, Charlene Brooke, NP  Milk Thistle 1000 MG CAPS Take by mouth.    [provider]  nystatin-triamcinolone (MYCOLOG II) cream Apply 1 application topically 2 (two) times daily. Patient not taking: Reported on 09/20/2019 06/30/19   Nche, Charlene Brooke, NP  olmesartan (BENICAR) 5 MG tablet Take 1 tablet (5 mg total) by mouth daily. 06/30/19   Nche, Charlene Brooke, NP  OVER THE COUNTER MEDICATION Curamin for pain relive.    [provider]    Allergies    Lisinopril  Review of Systems   Review of Systems  Constitutional: Positive for appetite change. Negative for chills and fever.  Respiratory: Negative for shortness of breath.   Cardiovascular: Negative for chest pain.  Gastrointestinal: Negative for abdominal distention, abdominal pain, anal bleeding, blood in stool, constipation, diarrhea, nausea, rectal pain and vomiting.  Genitourinary: Negative for difficulty urinating, dysuria, hematuria and urgency.       Positive for dark colored urine.   Skin: Positive for color change.  All other systems reviewed and are negative.   Physical Exam Updated Vital Signs BP (!) 144/81 (BP Location: Left Arm)   Pulse 73   Temp 99.1 F (37.3 C) (Oral)   Resp 17   Ht '5\' 3"'  (1.6 m)   Wt 94 kg   SpO2 96%   BMI 36.71 kg/m   Physical Exam Vitals and nursing note reviewed.  Constitutional:      General: She is not in acute distress.    Appearance: She is well-developed. She is not toxic-appearing.  HENT:     Head: Normocephalic and atraumatic.  Eyes:      General: Scleral icterus present.        Right eye: No discharge.        Left eye: No discharge.     Extraocular Movements: Extraocular movements intact.     Conjunctiva/sclera: Conjunctivae normal.  Cardiovascular:     Rate and Rhythm: Normal rate and regular rhythm.  Pulmonary:     Effort: Pulmonary effort is normal. No respiratory distress.  Breath sounds: Normal breath sounds. No wheezing, rhonchi or rales.  Abdominal:     General: There is no distension.     Palpations: Abdomen is soft.     Tenderness: There is no abdominal tenderness. There is no guarding or rebound. Negative signs include Murphy's sign.  Musculoskeletal:     Cervical back: Neck supple.  Skin:    General: Skin is warm and dry.     Coloration: Skin is jaundiced.     Findings: No rash.  Neurological:     Mental Status: She is alert.     Comments: Clear speech.   Psychiatric:        Behavior: Behavior normal.     ED Results / Procedures / Treatments   Labs (all labs ordered are listed, but only abnormal results are displayed) Labs Reviewed  URINALYSIS, ROUTINE W REFLEX MICROSCOPIC - Abnormal; Notable for the following components:      Result Value   Color, Urine AMBER (*)    Bilirubin Urine SMALL (*)    Protein, ur 30 (*)    Bacteria, UA FEW (*)    All other components within normal limits  SARS CORONAVIRUS 2 (TAT 6-24 HRS)  HIV ANTIBODY (ROUTINE TESTING W REFLEX)  COMPREHENSIVE METABOLIC PANEL  CBC    EKG None  Radiology US Abdomen Limited RUQ  Result Date: 09/20/2019 CLINICAL DATA:  64 year old female with elevated bilirubin. EXAM: ULTRASOUND ABDOMEN LIMITED RIGHT UPPER QUADRANT COMPARISON:  Ultrasound dated 08/19/2019. FINDINGS: Gallbladder: There is sludge within the gallbladder. No definite shadowing stone, gallbladder wall thickening, or pericholecystic fluid. Negative sonographic Murphy's sign. Common bile duct: Diameter: 16 mm. Interval increase in the size of the common bile duct as  well as development of mild intrahepatic biliary ductal dilatation since the prior ultrasound. Correlation with clinical exam, and LFTs recommended. MRCP may provide better evaluation. Liver: There is heterogeneously increased liver echogenicity most commonly seen in the setting of fatty infiltration. Superimposed inflammation or fibrosis is not excluded. Clinical correlation is recommended. Portal vein is patent on color Doppler imaging with normal direction of blood flow towards the liver. Other: None. IMPRESSION: 1. Moderate amount of gallbladder sludge. No sonographic findings of acute cholecystitis. 2. Dilatation of the common bile duct, progressed since the prior ultrasound with interval development of mild intrahepatic biliary ductal dilatation. Clinical correlation is recommended. MRCP may provide better evaluation. 3. Probable fatty liver or changes of cirrhosis. Electronically Signed   By: Anner Crete M.D.   On: 09/20/2019 18:38    Procedures Procedures (including critical care time)  Medications Ordered in ED Medications - No data to display  ED Course  I have reviewed the triage vital signs and the nursing notes.  Pertinent labs & imaging results that were available during my care of the patient were reviewed by me and considered in my medical decision making (see chart for details).    Melanie Alvarez was evaluated in Emergency Department on 09/20/2019 for the symptoms described in the history of present illness. He/she was evaluated in the context of the global COVID-19 pandemic, which necessitated consideration that the patient might be at risk for infection with the SARS-CoV-2 virus that causes COVID-19. Institutional protocols and algorithms that pertain to the evaluation of patients at risk for COVID-19 are in a state of rapid change based on information released by regulatory bodies including the CDC and federal and state organizations. These policies and algorithms were  followed during the patient's care in the ED.  MDM Rules/Calculators/A&P                      Patient presents to the ED due to abnormal outpatient labs performed in clinic. Patient is nontoxic, vitals WNL with the exception of elevated BP- doubt HTN emergency. Jaundice on exam. Abdomen nontender w/o peritoneal signs.   Labs reviewed from earlier today:  CBC: Mild leukocytosis & anemia. Platelets mildly elevated. BMP: Mild hyponatremia @ 129, hypochloremia @ 3.1, and hypokalemia @ 3.1.  Hepatic function panel: T bili elevated @ 23.7, Direct bili elevation @ 17.3, alk phos elevation @ 601, AST/ALT elevation @ 248/257, mild hypoalbuminemia @ 3.4 PT/INR: 1.3/14.2  Acute change in liver function tests.  Jaundice noted in the ED. Abdomen nontender w/o peritoneal signs.  Plan for RUQ [place] to evaluate for obstruction.   RUQ [place]: 1. Moderate amount of gallbladder sludge. No sonographic findings of acute cholecystitis. 2. Dilatation of the common bile duct, progressed since the prior ultrasound with interval development of mild intrahepatic biliary ductal dilatation. Clinical correlation is recommended. MRCP may provide better evaluation. 3. Probable fatty liver or changes of cirrhosis  19:45: CONSULT: Discussed with gastroenterologist Dr. Fuller Plan, seems consistent w/ obstructive jaundice, recommends admission to hospitalist service, GI will consult, MRCP, repeat basic labs tomorrow AM. Appreciate consultation.   20:41: CONSULT: Discussed with hospitalist Dr. Maudie Mercury- Accepts admission.   Findings and plan of care discussed with supervising physician Dr. Wilson Singer who is in agreement.   Final Clinical Impression(s) / ED Diagnoses Final diagnoses:  Elevated bilirubin  Obstructive jaundice    Rx / DC Orders ED Discharge Orders    None       Amaryllis Dyke, PA-C 09/20/19 2036    Leafy Kindle 09/20/19 2041    Virgel Manifold, MD 09/21/19 [id number]

## 2019-09-20 NOTE — Progress Notes (Signed)
Pt admitted to [facility] 1512, A X O X 4, no c/o pain or nausea. Now on Tele. Skin intact. Ambulatory. IV L AC. All questions addressed and answered. Pt oriented to room and staff. Will continue to monitor.

## 2019-09-20 NOTE — H&P (Signed)
TRH H&P    Patient Demographics:    Melanie Alvarez, is a 64 y.o. female  MRN: [id number]  DOB - 07/02/1956  Admit Date - 09/20/2019  Referring MD/NP/PA: Kennith Maes  Outpatient Primary MD for the patient is Nche, Charlene Brooke, NP  Patient coming from:  home  Chief complaint- abnormal liver function   HPI:    Melanie Alvarez  is a 64 y.o. female,w hypertension, Dm2, Obeisity, Hepatitis C cirrhosis, former ETOH use, presents with abnormal liver function.  Pt was sent by her physician to ER for abnormal liver function.  Pt notes that her urine has been dark for the past 3 weeks.  Pt used to drink 6 pack of beer per day, but quit about 81month ago per patient. Pt notes weight loss.   Pt denies fever, chills, n/v, abd pain, diarrhea, brbpr, black stool.  Pt denies any recent change in medication.   In ED,  T 99.1, P 73, R 17, bp 144/81  Pox 96% on RA Wt 94kg   RUQ ultrasound IMPRESSION: 1. Moderate amount of gallbladder sludge. No sonographic findings of acute cholecystitis. 2. Dilatation of the common bile duct, progressed since the prior ultrasound with interval development of mild intrahepatic biliary ductal dilatation. Clinical correlation is recommended. MRCP may provide better evaluation. 3. Probable fatty liver or changes of cirrhosis.  MRCP IMPRESSION: 1. Signs of pancreatic/ampullary mass with extension into duodenum and obstruction of biliary and pancreatic ducts. 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 Endoscopic assessment is suggested. 2. Signs of pancreatic atrophy with lobular area in the pancreatic tail the raise the question of second small lesion versus is heterogeneous pancreas with signs of prior inflammation. 3. Biliary and pancreatic ductal obstruction with sludge and/or small stones layering dependently in the common bile duct  and intraductal calculi suspected in the dilated pancreatic duct near the biliary ductal and pancreatic ductal confluence. Some calculi may be as large as 1 cm. 4. Signs of nodal enlargement in the upper abdomen and even in lower chest raising the question of nodal involvement. 5. Query left supraclavicular nodal enlargement, chest imaging may be helpful given findings above. 6. Small solid and enhancing lesion in the lower pole of the right kidney. Recommend attention on follow-up imaging.  Na 129, K 3.1, Bun 11, Creatinine 0.98 Ast 248, Alt 157, Alk phos 601, T. Bili 23.7 INR 1.3 Wbc 11.8, Hgb 11.5, Plt 415 Hga1c 6.7  Urinalysis prot 30, Wbc 11-20, rbc 0-5    Pt will be admitted for Abnormal liver function test, secondary pancreatic or ampullary mass and acute lower uti.       Review of systems:    In addition to the HPI above,  No Fever-chills, No Headache, No changes with Vision or hearing, No problems swallowing food or Liquids, No Chest pain, Cough or Shortness of Breath, No Abdominal pain, No Nausea or Vomiting, bowel movements are regular, No Blood in stool or Urine, No dysuria, No new skin rashes or bruises, No new joints pains-aches,  No new  weakness, tingling, numbness in any extremity, + weight loss No polyuria, polydypsia or polyphagia, No significant Mental Stressors.  All other systems reviewed and are negative.    Past History of the following :    Past Medical History:  Diagnosis Date  . Cirrhosis ([place])   . Diabetes mellitus 2012  . Diverticulosis   . Gallbladder sludge   . Hepatitis 2010   history of Hepatitis C  . Hepatitis C   . Hypertension 2011  . Obesity       Past Surgical History:  Procedure Laterality Date  . COLONOSCOPY  07/24/2011   Procedure: COLONOSCOPY;  Surgeon: Landry Dyke, MD;  Location: [facility];  Service: Endoscopy;  Laterality: N/A;  . Dental Surgery Tooth Extraction        Social History:      Social  History   Tobacco Use  . Smoking status: Current Some Day Smoker    Packs/day: 0.50    Types: Cigarettes  . Smokeless tobacco: Never Used  Substance Use Topics  . Alcohol use: Yes    Alcohol/week: 30.0 standard drinks    Types: 24 Cans of beer, 6 Shots of liquor per week    Comment: weekends       Family History :     Family History  Problem Relation Age of Onset  . Diabetes Sister   . Cancer Maternal Grandmother        leukemia   . Diabetes Maternal Grandfather   . Diabetes Paternal Grandfather   . Diabetes Paternal Grandmother   . Hypertension Sister   . Hypertension Brother   . Anesthesia problems Neg Hx        Home Medications:   Prior to Admission medications   Medication Sig Start Date End Date Taking? Authorizing Provider  allopurinol (ZYLOPRIM) 100 MG tablet Take 1 tablet (100 mg total) by mouth 3 (three) times daily after meals. 05/26/19  Yes Nche, Charlene Brooke, NP  amLODipine (NORVASC) 10 MG tablet Take 1 tablet (10 mg total) by mouth at bedtime. 05/26/19  Yes Nche, Charlene Brooke, NP  atorvastatin (LIPITOR) 20 MG tablet Take 1 tablet (20 mg total) by mouth daily at 6 PM. 05/26/19  Yes Nche, Charlene Brooke, NP  cholecalciferol (VITAMIN D) 400 UNITS TABS tablet Take 400 Units by mouth daily.   Yes [provider]  Lecithin 1200 MG CAPS Take by mouth.   Yes [provider]  metFORMIN (GLUCOPHAGE) 500 MG tablet Take 1 tablet (500 mg total) by mouth 2 (two) times daily with a meal. 05/26/19  Yes Nche, Charlene Brooke, NP  metoprolol succinate (TOPROL-XL) 25 MG 24 hr tablet Take 1 tablet (25 mg total) by mouth daily. Take with or immediately following a meal. 05/26/19  Yes Nche, Charlene Brooke, NP  Milk Thistle 1000 MG CAPS Take by mouth.   Yes [provider]  olmesartan (BENICAR) 5 MG tablet Take 1 tablet (5 mg total) by mouth daily. 06/30/19  Yes Nche, Charlene Brooke, NP  OVER THE COUNTER MEDICATION Curamin for pain relive.   Yes [provider]  glucose blood test strip Test blood glucose once daily. One Touch-Delica test strips 6/50/35   Nche, Charlene Brooke, NP  nystatin-triamcinolone (MYCOLOG II) cream Apply 1 application topically 2 (two) times daily. Patient not taking: Reported on 09/20/2019 06/30/19   Nche, Charlene Brooke, NP     Allergies:     Allergies  Allergen Reactions  . Lisinopril Swelling    Facial and  upper lip     Physical Exam:   Vitals  Blood pressure (!) 168/72, pulse 67, temperature 99.1 F (37.3 C), temperature source Oral, resp. rate 18, height '5\' 3"'  (1.6 m), weight 94 kg, SpO2 100 %.  1.  General: axoxo3  2. Psychiatric: euthymic  3. Neurologic: nonfocal  4. HEENMT:  Icteric,  pupils 1.84m symmetric, direct, consensual, near intact Neck: no jvd  5. Respiratory : CTAB  6. Cardiovascular : rrr s1, s2, no m/g/r  7. Gastrointestinal:  Abd: soft, nt, nd, +bs  8. Skin:  Ext: no c/c/e, no telangiectasia, no palmar erythemia Melasma, skin is yellow  9.Musculoskeletal:  Good ROM    Data Review:    CBC Recent Labs  Lab 09/20/19 1124  WBC 11.8*  HGB 11.5*  HCT 33.9*  PLT 415.0 Repeated and verified X2.*  MCV 89.1  MCHC 33.9  RDW 15.7*  LYMPHSABS 3.0  MONOABS 0.8  EOSABS 0.1  BASOSABS 0.1   ------------------------------------------------------------------------------------------------------------------  Results for orders placed or performed in visit on 09/20/19 (from the past 48 hour(s))  Hepatic function panel     Status: Abnormal   Collection Time: 09/20/19 11:24 AM  Result Value Ref Range   Total Bilirubin 23.7 (H) 0.2 - 1.2 mg/dL   Bilirubin, Direct 17.3 (H) 0.0 - 0.3 mg/dL   Alkaline Phosphatase 601 (H) 39 - 117 U/L   AST 248 (H) 0 - 37 U/L   ALT 157 (H) 0 - 35 U/L   Total Protein 7.4 6.0 - 8.3 g/dL   Albumin 3.4 (L) 3.5 - 5.2 g/dL  CBC w/Diff     Status: Abnormal   Collection Time: 09/20/19 11:24 AM  Result Value Ref Range   WBC 11.8 (H) 4.0  - 10.5 K/uL   RBC 3.81 (L) 3.87 - 5.11 Mil/uL   Hemoglobin 11.5 (L) 12.0 - 15.0 g/dL   HCT 33.9 (L) 36.0 - 46.0 %   MCV 89.1 78.0 - 100.0 fl   MCHC 33.9 30.0 - 36.0 g/dL   RDW 15.7 (H) 11.5 - 15.5 %   Platelets 415.0 Repeated and verified X2. (H) 150.0 - 400.0 K/uL   Neutrophils Relative % 66.1 43.0 - 77.0 %   Lymphocytes Relative 25.5 12.0 - 46.0 %   Monocytes Relative 6.6 3.0 - 12.0 %   Eosinophils Relative 0.6 0.0 - 5.0 %   Basophils Relative 1.2 0.0 - 3.0 %   Neutro Abs 7.8 (H) 1.4 - 7.7 K/uL   Lymphs Abs 3.0 0.7 - 4.0 K/uL   Monocytes Absolute 0.8 0.1 - 1.0 K/uL   Eosinophils Absolute 0.1 0.0 - 0.7 K/uL   Basophils Absolute 0.1 0.0 - 0.1 K/uL  Hemoglobin A1c     Status: Abnormal   Collection Time: 09/20/19 11:24 AM  Result Value Ref Range   Hgb A1c MFr Bld 6.7 (H) 4.6 - 6.5 %    Comment: Glycemic Control Guidelines for People with Diabetes:Non Diabetic:  <6%Goal of Therapy: <7%Additional Action Suggested:  >>7%  Basic metabolic panel     Status: Abnormal   Collection Time: 09/20/19 11:24 AM  Result Value Ref Range   Sodium 129 (L) 135 - 145 mEq/L   Potassium 3.1 (L) 3.5 - 5.1 mEq/L   Chloride 92 (L) 96 - 112 mEq/L   CO2 26 19 - 32 mEq/L   Glucose, Bld 111 (H) 70 - 99 mg/dL   BUN 11 6 - 23 mg/dL   Creatinine, Ser 0.98 0.40 - 1.20 mg/dL  GFR 69.47 >60.00 mL/min   Calcium 9.4 8.4 - 10.5 mg/dL  Protime-INR     Status: Abnormal   Collection Time: 09/20/19 11:24 AM  Result Value Ref Range   INR 1.3 (H) 0.8 - 1.0 ratio   Prothrombin Time 14.2 (H) 9.6 - 13.1 sec    Chemistries  Recent Labs  Lab 09/20/19 1124  NA 129*  K 3.1*  CL 92*  CO2 26  GLUCOSE 111*  BUN 11  CREATININE 0.98  CALCIUM 9.4  AST 248*  ALT 157*  ALKPHOS 601*  BILITOT 23.7*    ------------------------------------------------------------------------------------------------------------------  ------------------------------------------------------------------------------------------------------------------ GFR: Estimated Creatinine Clearance: 64.8 mL/min (by C-G formula based on SCr of 0.98 mg/dL). Liver Function Tests: Recent Labs  Lab 09/20/19 1124  AST 248*  ALT 157*  ALKPHOS 601*  BILITOT 23.7*  PROT 7.4  ALBUMIN 3.4*   No results for input(s): LIPASE, AMYLASE in the last 168 hours. No results for input(s): AMMONIA in the last 168 hours. Coagulation Profile: Recent Labs  Lab 09/20/19 1124  INR 1.3*   Cardiac Enzymes: No results for input(s): CKTOTAL, CKMB, CKMBINDEX, TROPONINI in the last 168 hours. BNP (last 3 results) No results for input(s): PROBNP in the last 8760 hours. HbA1C: Recent Labs    09/20/19 1124  HGBA1C 6.7*   CBG: No results for input(s): GLUCAP in the last 168 hours. Lipid Profile: No results for input(s): CHOL, HDL, LDLCALC, TRIG, CHOLHDL, LDLDIRECT in the last 72 hours. Thyroid Function Tests: No results for input(s): TSH, T4TOTAL, FREET4, T3FREE, THYROIDAB in the last 72 hours. Anemia Panel: No results for input(s): VITAMINB12, FOLATE, FERRITIN, TIBC, IRON, RETICCTPCT in the last 72 hours.  --------------------------------------------------------------------------------------------------------------- Urine analysis:    Component Value Date/Time   BILIRUBINUR NEGATIVE 10/22/2018 1556   PROTEINUR Positive (A) 10/22/2018 1556   UROBILINOGEN 0.2 10/22/2018 1556   NITRITE NEGATIVE 10/22/2018 1556   LEUKOCYTESUR Negative 10/22/2018 1556      Imaging Results:    US Abdomen Limited RUQ  Result Date: 09/20/2019 CLINICAL DATA:  64 year old female with elevated bilirubin. EXAM: ULTRASOUND ABDOMEN LIMITED RIGHT UPPER QUADRANT COMPARISON:  Ultrasound dated 08/19/2019. FINDINGS: Gallbladder: There is sludge within the  gallbladder. No definite shadowing stone, gallbladder wall thickening, or pericholecystic fluid. Negative sonographic Murphy's sign. Common bile duct: Diameter: 16 mm. Interval increase in the size of the common bile duct as well as development of mild intrahepatic biliary ductal dilatation since the prior ultrasound. Correlation with clinical exam, and LFTs recommended. MRCP may provide better evaluation. Liver: There is heterogeneously increased liver echogenicity most commonly seen in the setting of fatty infiltration. Superimposed inflammation or fibrosis is not excluded. Clinical correlation is recommended. Portal vein is patent on color Doppler imaging with normal direction of blood flow towards the liver. Other: None. IMPRESSION: 1. Moderate amount of gallbladder sludge. No sonographic findings of acute cholecystitis. 2. Dilatation of the common bile duct, progressed since the prior ultrasound with interval development of mild intrahepatic biliary ductal dilatation. Clinical correlation is recommended. MRCP may provide better evaluation. 3. Probable fatty liver or changes of cirrhosis. Electronically Signed   By: Anner Crete M.D.   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 STOP Atorvastatin STOP Allopurinol Check Ca 19-9 NPO [facility] consulted by ED, appreciate input  Hypertension Cont Amlodipine 74m po qday Cont Toprol XL 251mpo qday Cont Benicar  36m po qday  Dm2 STOP Metformin  fsbs q4h, ISS  Acute lower UTI Urine culture cipro iv pharmacy to dose  Hypokalemia Replete Check cmp in am  Hyponatremia Hydrate with ns iv Consider further w/up with serum osm, cortisol, tsh, urine sodium, urine osm Check cmp in am  DVT Prophylaxis-   - SCDs    AM Labs Ordered, also please review Full Orders  Family Communication: Admission, patients condition  and plan of care including tests being ordered have been discussed with the patient  who indicate understanding and agree with the plan and Code Status.  Code Status:  FULL CODE per patient, attempted to contact husband, no response  Admission status: Inpatient: Based on patients clinical presentation and evaluation of above clinical data, I have made determination that patient meets Inpatient criteria at this time.  Pt has bilirubin >20, and new pancreatic/ ampullary mass,  Pt will require > 2 nites stay.   Time spent in minutes : 70 minutes   JJani GravelM.D on 09/20/2019 at 8:15 PM

## 2019-09-20 NOTE — ED Triage Notes (Signed)
Received call from Dr's office to come in to ED due to abnormal Liver labs obtained today

## 2019-09-20 NOTE — Patient Instructions (Addendum)
Notified patient and her husband about lab results. They were instructed to go to closest ED ASAP. They agreed to go to [facility] ED today. avoid driving while experiencing dizziness.  Jaundice, Adult  Jaundice is when the skin, the whites of the eyes, and the lining of the mouth and nose (mucous membranes) turn a yellowish color. It is caused by having too much bilirubin in the blood. Bilirubin is made by the normal breakdown of red blood cells. Having jaundice means that your body's bile system may not be working as it should. The bile system is made up of the liver, the gallbladder, and the bile ducts. They work together to make, store, and move bile. Jaundice may be caused by drinking too much alcohol. It may also be caused by liver disease, infections, cancers, or some medicines. Your doctor may treat you with medicine, fluids, or surgery. Follow these instructions at home:   Take over-the-counter and prescription medicines only as told by your doctor.  You can use skin lotion to help with itching.  Drink plenty of fluids.  Do not drink alcohol.  Keep all follow-up visits as told by your doctor. This is important. Contact a doctor if:  You have a fever.  You have swelling or pain in your belly (abdomen). Get help right away if:  Your symptoms get worse all of a sudden.  Your pain gets worse.  You keep throwing up (vomiting).  You throw up blood.  You become weak or confused.  You get a very bad headache.  You have blood in your poop.  You lose too much body fluid (dehydration). Signs that have you lost too much body fluid include: ? A very dry mouth. ? A fast, weak pulse. ? Fast breathing. ? Blue lips. Summary  Jaundice is when the skin, the whites of the eyes, and the lining of the mouth and nose turn a yellowish color.  Jaundice may be caused by a problem in the liver, the gallbladder, and the bile ducts.  Follow your doctor's instructions for home care.  These include drinking plenty of fluid, not drinking alcohol, and taking medicines as told.  Get help right away if your symptoms get worse, you feel weak or confused, you throw up, you have blood in your poop, you have a very bad headache, or you lose too much body fluid. This information is not intended to replace advice given to you by your health care provider. Make sure you discuss any questions you have with your health care provider. Document Revised: 08/08/2017 Document Reviewed: 08/08/2017 Elsevier Patient Education  2020 [employer].

## 2019-09-20 NOTE — Progress Notes (Signed)
Subjective:  Patient ID: Melanie Alvarez, female    DOB: 1955/12/04  Age: 64 y.o. MRN: [id number]  CC: Ear Fullness (pt is ears fullness,off balance,cant functions at work/going on 1 wk/went to urgent care and got the cipro ear drop since 4 days ago. )  Dizziness This is a new problem. The current episode started in the past 7 days. The problem occurs constantly. The problem has been gradually worsening. Associated symptoms include anorexia, fatigue and myalgias. Pertinent negatives include no abdominal pain, arthralgias, change in bowel habit, chest pain, chills, congestion, coughing, diaphoresis, fever, headaches, joint swelling, nausea, neck pain, numbness, rash, sore throat, swollen glands, urinary symptoms, vertigo, visual change, vomiting or weakness. She has tried nothing for the symptoms.  jaundice x39months, last appt with Dr. Linus Alvarez 57months ago.  Reviewed past Medical, Social and Family history today.  Outpatient Medications Prior to Visit  Medication Sig Dispense Refill  . allopurinol (ZYLOPRIM) 100 MG tablet Take 1 tablet (100 mg total) by mouth 3 (three) times daily after meals. 90 tablet 5  . amLODipine (NORVASC) 10 MG tablet Take 1 tablet (10 mg total) by mouth at bedtime. 90 tablet 3  . aspirin 81 MG chewable tablet Chew 81 mg by mouth daily.    Marland Kitchen atorvastatin (LIPITOR) 20 MG tablet Take 1 tablet (20 mg total) by mouth daily at 6 PM. 90 tablet 3  . cholecalciferol (VITAMIN D) 400 UNITS TABS tablet Take 400 Units by mouth daily.    Marland Kitchen glucose blood test strip Test blood glucose once daily. One Touch-Delica test strips [id number] each 6  . Lecithin 1200 MG CAPS Take by mouth.    . metFORMIN (GLUCOPHAGE) 500 MG tablet Take 1 tablet (500 mg total) by mouth 2 (two) times daily with a meal. 180 tablet 1  . metoprolol succinate (TOPROL-XL) 25 MG 24 hr tablet Take 1 tablet (25 mg total) by mouth daily. Take with or immediately following a meal. 90 tablet 3  . Milk Thistle 1000 MG CAPS Take by  mouth.    . olmesartan (BENICAR) 5 MG tablet Take 1 tablet (5 mg total) by mouth daily. 90 tablet 1  . OVER THE COUNTER MEDICATION Curamin for pain relive.    . ciprofloxacin-dexamethasone (CIPRODEX) OTIC suspension     . nystatin-triamcinolone (MYCOLOG II) cream Apply 1 application topically 2 (two) times daily. (Patient not taking: Reported on 09/20/2019) 30 g 0   No facility-administered medications prior to visit.    ROS See HPI  Objective:  BP 128/80   Pulse 75   Temp (!) 97 F (36.1 C) (Tympanic)   Ht 5\' 3"  (1.6 m)   Wt 209 lb 3.2 oz (94.9 kg)   SpO2 99%   BMI 37.06 kg/m   BP Readings from Last 3 Encounters:  09/20/19 (!) 144/81  09/20/19 128/80  08/11/19 (!) 148/83    Wt Readings from Last 3 Encounters:  09/20/19 209 lb 3.2 oz (94.9 kg)  08/11/19 217 lb 12.8 oz (98.8 kg)  06/30/19 226 lb (102.5 kg)    Physical Exam HENT:     Right Ear: Tympanic membrane, ear canal and external ear normal.     Left Ear: Tympanic membrane, ear canal and external ear normal.  Cardiovascular:     Rate and Rhythm: Normal rate.     Pulses: Normal pulses.     Heart sounds: Normal heart sounds.  Pulmonary:     Effort: Pulmonary effort is normal.     Breath sounds: Normal  breath sounds.  Abdominal:     General: There is no distension.     Palpations: Abdomen is soft.  Musculoskeletal:     Right lower leg: No edema.     Left lower leg: No edema.  Skin:    Coloration: Skin is jaundiced.  Neurological:     Mental Status: She is alert and oriented to person, place, and time.  Psychiatric:        Mood and Affect: Mood normal.        Behavior: Behavior normal.        Thought Content: Thought content normal.     Lab Results  Component Value Date   WBC 11.8 (H) 09/20/2019   HGB 11.5 (L) 09/20/2019   HCT 33.9 (L) 09/20/2019   PLT 415.0 Repeated and verified X2. (H) 09/20/2019   GLUCOSE 111 (H) 09/20/2019   CHOL 147 05/25/2019   TRIG 156.0 (H) 05/25/2019   HDL 49.00  05/25/2019   LDLDIRECT 110 (H) 09/30/2012   LDLCALC 67 05/25/2019   ALT 157 (H) 09/20/2019   AST 248 (H) 09/20/2019   NA 129 (L) 09/20/2019   K 3.1 (L) 09/20/2019   CL 92 (L) 09/20/2019   CREATININE 0.98 09/20/2019   BUN 11 09/20/2019   CO2 26 09/20/2019   TSH 2.90 05/25/2019   INR 1.3 (H) 09/20/2019   HGBA1C 6.7 (H) 09/20/2019   MICROALBUR 1.1 10/05/2018    Assessment & Plan:  This visit occurred during the SARS-CoV-2 public health emergency.  Safety protocols were in place, including screening questions prior to the visit, additional usage of staff PPE, and extensive cleaning of exam room while observing appropriate contact time as indicated for disinfecting solutions.   Melanie Alvarez was seen today for ear fullness.  Diagnoses and all orders for this visit:  Jaundice -     Hepatic function panel -     CBC w/Diff -     Basic metabolic panel -     Protime-INR -     Bilirubin, fractionated(tot/dir/indir)  Abnormal liver enzymes -     Hepatic function panel -     Protime-INR -     Bilirubin, fractionated(tot/dir/indir)  Dizziness -     Basic metabolic panel  Type 2 diabetes mellitus without complication, without long-term current use of insulin (HCC) -     Hemoglobin A1c   I am having Melanie Alvarez maintain her cholecalciferol, aspirin, glucose blood, allopurinol, atorvastatin, amLODipine, metFORMIN, metoprolol succinate, nystatin-triamcinolone, olmesartan, ciprofloxacin-dexamethasone, Milk Thistle, Lecithin, and OVER THE COUNTER MEDICATION.  No orders of the defined types were placed in this encounter.  Problem List Items Addressed This Visit      Digestive   Cirrhosis of liver ([place])     Endocrine   DM (diabetes mellitus) ([place])   Relevant Orders   Hemoglobin A1c (Completed)    Other Visit Diagnoses    Jaundice    -  Primary   Relevant Orders   Hepatic function panel (Completed)   CBC w/Diff (Completed)   Basic metabolic panel (Completed)   Protime-INR  (Completed)   Bilirubin, fractionated(tot/dir/indir)   Dizziness       Relevant Orders   Basic metabolic panel (Completed)      Follow-up: No follow-ups on file.  Wilfred Lacy, NP

## 2019-09-20 NOTE — Progress Notes (Signed)
Informed Melanie Alvarez about her lab results. Advised to go to closest ED for additional evaluation. She agreed to go to [facility] ED. Report called to ED charge nurse

## 2019-09-20 NOTE — ED Notes (Signed)
Pt ambulatory to restroom

## 2019-09-20 NOTE — ED Notes (Signed)
Patient transported to MRI 

## 2019-09-20 NOTE — Progress Notes (Signed)
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 In epic. On call notified.

## 2019-09-20 NOTE — Plan of Care (Signed)
Xcover I spoke with the patient informing her of the MRCP results that there is pancreatic vs ampullary mass likely causing obstruction of her bile duct at 10:40pm Add CA 19-9 to AM labs.  GI has been consulted by ED, appreciate input

## 2019-09-21 DIAGNOSIS — R17 Unspecified jaundice: Secondary | ICD-10-CM

## 2019-09-21 DIAGNOSIS — K831 Obstruction of bile duct: Secondary | ICD-10-CM

## 2019-09-21 DIAGNOSIS — R935 Abnormal findings on diagnostic imaging of other abdominal regions, including retroperitoneum: Secondary | ICD-10-CM

## 2019-09-21 DIAGNOSIS — K769 Liver disease, unspecified: Secondary | ICD-10-CM

## 2019-09-21 LAB — COMPREHENSIVE METABOLIC PANEL
ALT: 138 U/L — ABNORMAL HIGH (ref 0–44)
AST: 236 U/L — ABNORMAL HIGH (ref 15–41)
Albumin: 2.2 g/dL — ABNORMAL LOW (ref 3.5–5.0)
Alkaline Phosphatase: 450 U/L — ABNORMAL HIGH (ref 38–126)
Anion gap: 10 (ref 5–15)
BUN: 8 mg/dL (ref 8–23)
CO2: 24 mmol/L (ref 22–32)
Calcium: 8.6 mg/dL — ABNORMAL LOW (ref 8.9–10.3)
Chloride: 98 mmol/L (ref 98–111)
Creatinine, Ser: <0.3 mg/dL — ABNORMAL LOW (ref 0.44–1.00)
Glucose, Bld: 125 mg/dL — ABNORMAL HIGH (ref 70–99)
Potassium: 2.9 mmol/L — ABNORMAL LOW (ref 3.5–5.1)
Sodium: 132 mmol/L — ABNORMAL LOW (ref 135–145)
Total Bilirubin: 21.5 mg/dL (ref 0.3–1.2)
Total Protein: 6.8 g/dL (ref 6.5–8.1)

## 2019-09-21 LAB — GLUCOSE, CAPILLARY
Glucose-Capillary: 102 mg/dL — ABNORMAL HIGH (ref 70–99)
Glucose-Capillary: 120 mg/dL — ABNORMAL HIGH (ref 70–99)
Glucose-Capillary: 112 mg/dL — ABNORMAL HIGH (ref 70–99)
Glucose-Capillary: 179 mg/dL — ABNORMAL HIGH (ref 70–99)
Glucose-Capillary: 121 mg/dL — ABNORMAL HIGH (ref 70–99)

## 2019-09-21 LAB — CK TOTAL AND CKMB (NOT AT ARMC)
CK, MB: 1.6 ng/mL (ref 0.5–5.0)
Relative Index: INVALID (ref 0.0–2.5)
Total CK: 78 U/L (ref 38–234)

## 2019-09-21 LAB — CBC
HCT: 31.9 % — ABNORMAL LOW (ref 36.0–46.0)
Hemoglobin: 10.8 g/dL — ABNORMAL LOW (ref 12.0–15.0)
MCH: 30 pg (ref 26.0–34.0)
MCHC: 33.9 g/dL (ref 30.0–36.0)
MCV: 88.6 fL (ref 80.0–100.0)
Platelets: 397 10*3/uL (ref 150–400)
RBC: 3.6 MIL/uL — ABNORMAL LOW (ref 3.87–5.11)
RDW: 16.9 % — ABNORMAL HIGH (ref 11.5–15.5)
WBC: 11 10*3/uL — ABNORMAL HIGH (ref 4.0–10.5)
nRBC: 0 % (ref 0.0–0.2)

## 2019-09-21 LAB — SARS CORONAVIRUS 2 (TAT 6-24 HRS): SARS Coronavirus 2: NEGATIVE

## 2019-09-21 LAB — BILIRUBIN, FRACTIONATED(TOT/DIR/INDIR)
Bilirubin, Direct: >10 mg/dL — ABNORMAL HIGH (ref 0.0–0.2)
Total Bilirubin: 26.6 mg/dL — ABNORMAL HIGH (ref 0.2–1.2)

## 2019-09-21 MED ORDER — PIPERACILLIN-TAZOBACTAM 3.375 G IVPB
3.3750 g | Freq: Three times a day (TID) | INTRAVENOUS | Status: DC
  Administered 2019-09-21 – 2019-09-23 (×5): 3.375 g via INTRAVENOUS
  Filled 2019-09-21 (×7): qty 50

## 2019-09-21 MED ORDER — POTASSIUM CHLORIDE 10 MEQ/100ML IV SOLN
10.0000 meq | INTRAVENOUS | Status: AC
  Administered 2019-09-21 (×4): 10 meq via INTRAVENOUS
  Filled 2019-09-21 (×4): qty 100

## 2019-09-21 MED ORDER — BOOST / RESOURCE BREEZE PO LIQD CUSTOM
1.0000 | Freq: Three times a day (TID) | ORAL | Status: DC
  Administered 2019-09-21 – 2019-09-25 (×5): 1 via ORAL

## 2019-09-21 MED ORDER — PIPERACILLIN-TAZOBACTAM 3.375 G IVPB 30 MIN
3.3750 g | Freq: Once | INTRAVENOUS | Status: AC
  Administered 2019-09-21: 3.375 g via INTRAVENOUS
  Filled 2019-09-21: qty 50

## 2019-09-21 MED ORDER — POTASSIUM CHLORIDE CRYS ER 20 MEQ PO TBCR
40.0000 meq | EXTENDED_RELEASE_TABLET | Freq: Once | ORAL | Status: AC
  Administered 2019-09-21: 40 meq via ORAL
  Filled 2019-09-21: qty 2

## 2019-09-21 MED ORDER — SODIUM CHLORIDE 0.9 % IV SOLN: INTRAVENOUS | Status: AC

## 2019-09-21 MED ORDER — PIPERACILLIN-TAZOBACTAM 3.375 G IVPB
3.3750 g | Freq: Three times a day (TID) | INTRAVENOUS | Status: DC
  Filled 2019-09-21: qty 50

## 2019-09-21 NOTE — Progress Notes (Signed)
PROGRESS NOTE    Melanie Alvarez  MRN:[id number] DOB: 12/14/1955 DOA: 09/20/2019 PCP: Nche, Charlotte Lum, NP   Brief Narrative: 63-year-old female with significant medical history of diabetes mellitus type 2, hypertension, obesity, hepatitis C status post treatment, cirrhosis from her alcohol abuse who has quit 1.5 to 3 months ago sent to the ER by PCP due to abnormal liver function test found on the lab work. Patient complained of having couple weeks of jaundice poor appetite and dark urine. In the ER vitals are stable. Labs showed significantly Ast 248, Alt 157, Alk phos 601, T. Bili 23.7, patient underwent a right upper quadrant ultrasound and then MRCP-concerning for ampullary adenocarcinoma or pancreatic adenocarcinoma. GI was consulted and patient was admitted.  Subjective: NO COMPLAINTS No abd pain, nausea or vomiting Overnight afebrile T-max 99.1, labs with potassium 2.9 sodium 132, total bili 29.5  Assessment & Plan:   Abnormal liver function/Pancreatic mass in the setting of prior hepatitis C (treated), liver cirrhosis: right upper quadrant ultrasound and then MRCP-concerning for ampullary adenocarcinoma or pancreatic adenocarcinoma, patient had an unintentional weight loss about 20 pounds in last 1 month. Discussed w/ GI- start Clears now, NPO MN for ERCP in am, GI adding prophylactic Zosyn.  Chronic hepatitis C, history of cirrhosis status post Harvoni in 2019 with SVR.  Checking hep C RNA, EGD for surveillance.  Hypokalemia-will replete po and iv. Repeat in am.  Acute lower UTI, follow-up urine culture.  On Zosyn for #1.  Hyponatremia: Normal saline hydration.  Starting diet  Normocytic anemia, no signs of active GI bleeding.  CBC in a.m.  HTN:blood pressure controlled.cont amlodipine, Toprol and irbesartan  HLD: Lipitor on hold for now.  T2DM, hba1c stable 6.7 09/20/19: Blood sugar is stable.  Hold Metformin, continue sliding scale insulin for now.  Obesity with Body  mass index is 36.71 kg/m.- wt loss 20 lb I na month non intentional.   DVT prophylaxis: SCD Code Status: FULL Family Communication: plan of care discussed with patient at bedside. Disposition Plan: Remains inpatient for further evaluation of her abnormal liver function test   Consultants: gi Procedures:see note Microbiology: Antimicrobials: Anti-infectives (From admission, onward)   None      Medications: Scheduled Meds: . amLODipine  10 mg Oral QHS  . insulin aspart  0-9 Units Subcutaneous Q4H  . irbesartan  37.5 mg Oral Daily  . metoprolol succinate  25 mg Oral Daily   Continuous Infusions:  Objective: Vitals:   09/20/19 1920 09/20/19 2121 09/21/19 0401 09/21/19 0424  BP: (!) 168/72 (!) 162/91 (!) 142/66   Pulse: 67 71 65   Resp: 18 19 18   Temp:  97.9 F (36.6 C) 98.6 F (37 C)   TempSrc:  Oral Oral   SpO2: 100% 100% 100%   Weight:    94 kg  Height:        Intake/Output Summary (Last 24 hours) at 09/21/2019 0820 Last data filed at 09/21/2019 0401 Gross per 24 hour  Intake 389.59 ml  Output --  Net 389.59 ml   Filed Weights   09/20/19 1637 09/21/19 0424  Weight: 94 kg 94 kg   Weight change:   Body mass index is 36.71 kg/m.  Intake/Output from previous day: 02/01 0701 - 02/02 0700 In: 389.6 [I.V.:389.6] Out: -  Intake/Output this shift: No intake/output data recorded.  Examination:  General exam: AAOx3, Obese,Weak appearing. HEENT:Oral mucosa moist, Ear/Nose WNL grossly, dentition normal. Respiratory system: Diminished at the base,no wheezing or crackles,no use of   accessory muscle Cardiovascular system: S1 & S2 +, No JVD,. Gastrointestinal system: Abdomen soft, NT,ND, BS+ Nervous System:Alert, awake, moving extremities and grossly nonfocal Extremities: No edema, distal peripheral pulses palpable.  Skin: No rashes,no icterus. MSK: Normal muscle bulk,tone, power   Data Reviewed: I have personally reviewed following labs and imaging  studies  CBC: Recent Labs  Lab 09/20/19 1124 09/21/19 0509  WBC 11.8* 11.0*  NEUTROABS 7.8*  --   HGB 11.5* 10.8*  HCT 33.9* 31.9*  MCV 89.1 88.6  PLT 415.0 Repeated and verified X2.* 161   Basic Metabolic Panel: Recent Labs  Lab 09/20/19 1124 09/21/19 0509  NA 129* 132*  K 3.1* 2.9*  CL 92* 98  CO2 26 24  GLUCOSE 111* 125*  BUN 11 8  CREATININE 0.98 <0.30*  CALCIUM 9.4 8.6*   GFR: CrCl cannot be calculated (This lab value cannot be used to calculate CrCl because it is not a number: <0.30). Liver Function Tests: Recent Labs  Lab 09/20/19 1124 09/21/19 0509  AST 248* 236*  ALT 157* 138*  ALKPHOS 601* 450*  BILITOT 26.6*  23.7* 21.5*  PROT 7.4 6.8  ALBUMIN 3.4* 2.2*   No results for input(s): LIPASE, AMYLASE in the last 168 hours. No results for input(s): AMMONIA in the last 168 hours. Coagulation Profile: Recent Labs  Lab 09/20/19 1124  INR 1.3*   Cardiac Enzymes: No results for input(s): CKTOTAL, CKMB, CKMBINDEX, TROPONINI in the last 168 hours. BNP (last 3 results) No results for input(s): PROBNP in the last 8760 hours. HbA1C: Recent Labs    09/20/19 1124  HGBA1C 6.7*   CBG: Recent Labs  Lab 09/20/19 2347 09/21/19 0403 09/21/19 0734  GLUCAP 111* 102* 120*   Lipid Profile: No results for input(s): CHOL, HDL, LDLCALC, TRIG, CHOLHDL, LDLDIRECT in the last 72 hours. Thyroid Function Tests: No results for input(s): TSH, T4TOTAL, FREET4, T3FREE, THYROIDAB in the last 72 hours. Anemia Panel: No results for input(s): VITAMINB12, FOLATE, FERRITIN, TIBC, IRON, RETICCTPCT in the last 72 hours. Sepsis Labs: No results for input(s): PROCALCITON, LATICACIDVEN in the last 168 hours.  Recent Results (from the past 240 hour(s))  SARS CORONAVIRUS 2 (TAT 6-24 HRS) Nasopharyngeal Nasopharyngeal Swab     Status: None   Collection Time: 09/20/19  9:35 PM   Specimen: Nasopharyngeal Swab  Result Value Ref Range Status   SARS Coronavirus 2 NEGATIVE NEGATIVE  Final    Comment: (NOTE) SARS-CoV-2 target nucleic acids are NOT DETECTED. The SARS-CoV-2 RNA is generally detectable in upper and lower respiratory specimens during the acute phase of infection. Negative results do not preclude SARS-CoV-2 infection, do not rule out co-infections with other pathogens, and should not be used as the sole basis for treatment or other patient management decisions. Negative results must be combined with clinical observations, patient history, and epidemiological information. The expected result is Negative. Fact Sheet for Patients: SugarRoll.be Fact Sheet for Healthcare Providers: https://www.woods-mathews.com/ This test is not yet approved or cleared by the [place] FDA and  has been authorized for detection and/or diagnosis of SARS-CoV-2 by FDA under an Emergency Use Authorization (EUA). This EUA will remain  in effect (meaning this test can be used) for the duration of the COVID-19 declaration under Section 56 4(b)(1) of the Act, 21 U.S.C. section 360bbb-3(b)(1), unless the authorization is terminated or revoked sooner. Performed at [facility], [place] [address], [place], [place] [address]       Radiology Studies: MR ABDOMEN MRCP W WO CONTAST  Result Date: 09/20/2019 CLINICAL DATA:  Jaundice. EXAM: MRI ABDOMEN WITHOUT AND WITH CONTRAST (INCLUDING MRCP) TECHNIQUE: Multiplanar multisequence MR imaging of the abdomen was performed both before and after the administration of intravenous contrast. Heavily T2-weighted images of the biliary and pancreatic ducts were obtained, and three-dimensional MRCP images were rendered by post processing. CONTRAST:  41m GADAVIST GADOBUTROL 1 MMOL/ML IV SOLN COMPARISON:  Abdominal sonogram of 09/20/2019 FINDINGS: Lower chest: Limited assessment of the lower chest shows no effusion or consolidation. Study limited by patient body habitus overall. 13 mm pre pericardial lymph  node is nonspecific. There is also a suggestion of irregularity of soft tissues in the left left supraclavicular region. This could represent nodal disease given findings below. Hepatobiliary: Signs of irregular filling defect in the duodenum, associated with biliary and pancreatic ductal dilation and pancreatic atrophy. Common bile duct is largest 15 mm with marked distension of the intrahepatic biliary tree. Common bile duct with tapered narrowing to the level of the ampulla. Sludge in the dependent gallbladder and small stones. Also likely with sludge in the common bile duct and dependent low signal intensity on reconstructed MRCP sequences that suggests either small stones or biliary sludge in the dependent portion of the distal common bile duct. No visible intrahepatic calculi. Pancreas: Marked dilation of dorsal pancreatic duct with signs of pancreatic atrophy in intraductal calculi suggested on T2 and MRCP sequences some as large is a cm and mainly present near the ductal confluence. Ampullary filling defect as outlined above. Ductal caliber in maximum axial dimension 14 mm of the pancreatic duct. In addition to extension into the ampulla there is abnormal soft tissue involving the pancreatic head best seen on subtraction images, image 74 of postcontrast data measuring approximately 4.4 x 3.2 cm in greatest axial dimension including extension into the lumen of the duodenum. Spleen:  Spleen is normal size without suspicious lesion. Adrenals/Urinary Tract: Adrenal glands are normal. Small part solid and enhancing renal lesion arises from the lower pole of the right kidney measuring 11 x 9 mm. No signs of hydronephrosis. Stomach/Bowel: No sign of bowel obstruction. Masslike area in the region of the ampulla filling the ampullary portion of the duodenum may measure as large as 3.5 x 3.1 cm in greatest axial dimension and results in ductal obstruction of both pancreatic and biliary duct. Lobular contour of the  pancreatic tail with heterogeneity, question of small lesion in this location as well 1.3 cm (image 49, venous phase data set, postcontrast images. Vascular/Lymphatic: Bulky periportal lymph nodes suspicious for neoplastic/metastatic lymph nodes based on location in the appearance of the duodenum. Also with enlarged hepatic gastric lymph nodes. (Image 59, post-contrast images) 1.4 cm portacaval lymph node (Image 46, post-contrast images 9 mm hepatic gastric lymph node. Other: Portal vein and SMV are patent. SMA is patent. No signs of soft tissue in case mint of vascular structures in the upper abdomen. Musculoskeletal: No suspicious bone lesions identified. IMPRESSION: 1. Signs of pancreatic/ampullary mass with extension into duodenum and obstruction of biliary and pancreatic ducts. 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 Endoscopic assessment is suggested. 2. Signs of pancreatic atrophy with lobular area in the pancreatic tail the raise the question of second small lesion versus is heterogeneous pancreas with signs of prior inflammation. 3. Biliary and pancreatic ductal obstruction with sludge and/or small stones layering dependently in the common bile duct and intraductal calculi suspected in the dilated pancreatic duct near the biliary ductal and pancreatic ductal confluence. Some calculi may  be as large as 1 cm. 4. Signs of nodal enlargement in the upper abdomen and even in lower chest raising the question of nodal involvement. 5. Query left supraclavicular nodal enlargement, chest imaging may be helpful given findings above. 6. Small solid and enhancing lesion in the lower pole of the right kidney. Recommend attention on follow-up imaging. This recommendation follows ACR consensus guidelines: White Paper of the ACR Incidental Findings Committee II on Vascular Findings. J Am Coll Radiol 2013; 10:789-794. 7. These results will be called to the ordering clinician or  representative by the Radiologist Assistant, and communication documented in the PACS or zVision Dashboard. Electronically Signed   By: Geoffrey  Wile M.D.   On: 09/20/2019 22:10   US Abdomen Limited RUQ  Result Date: 09/20/2019 CLINICAL DATA:  63-year-old female with elevated bilirubin. EXAM: ULTRASOUND ABDOMEN LIMITED RIGHT UPPER QUADRANT COMPARISON:  Ultrasound dated 08/19/2019. FINDINGS: Gallbladder: There is sludge within the gallbladder. No definite shadowing stone, gallbladder wall thickening, or pericholecystic fluid. Negative sonographic Murphy's sign. Common bile duct: Diameter: 16 mm. Interval increase in the size of the common bile duct as well as development of mild intrahepatic biliary ductal dilatation since the prior ultrasound. Correlation with clinical exam, and LFTs recommended. MRCP may provide better evaluation. Liver: There is heterogeneously increased liver echogenicity most commonly seen in the setting of fatty infiltration. Superimposed inflammation or fibrosis is not excluded. Clinical correlation is recommended. Portal vein is patent on color Doppler imaging with normal direction of blood flow towards the liver. Other: None. IMPRESSION: 1. Moderate amount of gallbladder sludge. No sonographic findings of acute cholecystitis. 2. Dilatation of the common bile duct, progressed since the prior ultrasound with interval development of mild intrahepatic biliary ductal dilatation. Clinical correlation is recommended. MRCP may provide better evaluation. 3. Probable fatty liver or changes of cirrhosis. Electronically Signed   By: Arash  Radparvar M.D.   On: 09/20/2019 18:38      LOS: 1 day   Time spent: More than 50% of that time was spent in counseling and/or coordination of care.  Ramesh KC, MD [facility]  09/21/2019, 8:20 AM   

## 2019-09-21 NOTE — Consult Note (Signed)
Referring Provider: Dr. Jani Gravel  Primary Care Physician:  Nche, Charlene Brooke, NP Primary Gastroenterologist:  Althia Forts, previously Dr. Paulita Fujita  Reason for Consultation:  Elevated LFTs, obstructive jaundice   HPI: Melanie Alvarez is a 64 y.o. female with a past medical history of hypertension, DM II, alcohol abuse, chronic hepatitis C with cirrhosis. Past d & C. No surgical history. She was seen by Wilfred Lacy NP at [facility] on 09/21/2019 with complaints of ear fullness and balance issues. She also reported being jaundiced for the past 2 months. Laboratory studies showed elevated LFTs and she was sent to [facility] ED for further evaluation.  She noticed her eyes appeared jaundiced at the end of Dec. 2020. She also noticed her stool color was gray brown, urine color was tea color and she had itchiness to her abdomen. She was concerned about these symptoms so she stopped drinking alcohol end of Dec. 2020. She was drinking 3 (12 oz) cans of beer daily or 1 pink of Vodka daily for many years. No drug use. She denies any history of pancreatitis. She has lost 20 lbs over the past 2 to 3 months. Weight was 226lbs 06/2019. Weight today 206 lbs. No fever, sweats or chills. No nausea or vomiting. Appetite is fair. She is passing gray brown stools daily, last BM was a few days ago. No rectal bleeding or melena. She infrequently takes Ibuprofen 811m 1 tab po for right and left shoulder pain, last dose taken 2 or 3 days ago. She also takes Aleve 2210m1 tab po as needed, she has takes one Aleve tab approximately 6 times monthly.  No chest pain or SOB. No GERD symptoms or dysphagia. Labs in the ED showed acute cholestasis.  T. Bili 23.7. Alk phos 601. AST 248. ALT 157. An Abdominal MRI/MRCP showed a pancreatic/ampullary mass, dilated CBD with obstruction of the biliary and pancreatic ducts concerning for ampullary verses pancreatic adenocarcinoma. See results below. GI consult requested  for further evaluation, ERCP with EUS.   She has a history of chronic hepatitis C GT1a followed by infectious disease specialist, Dr. CoLinus SalmonsShe was treated with Harvoni x 12 weeks 12/2017 with SVR. Her initial elastography showed F3/F4 and an abdominal sonogram did not show evidence of cirrhosis. An abdominal sono 2019 showed evidence of cirrhosis. No  thrombocytopenia. Her most recent surveillance abdominal sonogram 08/19/2019 which showed evidence of cirrhosis without any focal liver lesions. She received Hep A and Hep B vaccines in 20219. Her most recent Hep C RNA quant 07/21/2018 was negative. She's never had an EGD to survey for esophageal varices. She was referred to our office by Dr. CoLinus Salmonsor an EGD. Her initial office appointment is scheduled with Amy Esterewood PA-C 10/05/2019.   No family history of gastric, colon or pancreatic or liver cancer.   ED Course: Na 129. K 3.1. Glu 111. BUN 11. Cr. 0.98. Ca 9.4. Alk phos 601. Albumin 3.4. AST 248. ALT 157. T. Bili 23.7. Repeat T. Bili 26.6. Direct bili > 10. WBC 11.8. Hg 11.5. HCT 33.9. MCV 89.1. PLT 415. INR 1.3. SARS COVID 19 negative.. Influenza A & B negative. Urine protein + and WBC 11-20.   Chest Xray: Negative chest.  RUQ sonogram: 1. Moderate amount of gallbladder sludge. No sonographic findings of acute cholecystitis. 2. Dilatation of the common bile duct, progressed since the prior ultrasound with interval development of mild intrahepatic biliary ductal dilatation. Clinical correlation is recommended. MRCP may provide better evaluation.  3. Probable fatty liver or changes of cirrhosis.   Abdominal MRI/MRCP 1. Signs of pancreatic/ampullary mass with extension into duodenum and obstruction of biliary and pancreatic ducts. 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 Endoscopic assessment is suggested. 2. Signs of pancreatic atrophy with lobular area in the pancreatic tail the raise the  question of second small lesion versus is heterogeneous pancreas with signs of prior inflammation. 3. Biliary and pancreatic ductal obstruction with sludge and/or small stones layering dependently in the common bile duct and intraductal calculi suspected in the dilated pancreatic duct near the biliary ductal and pancreatic ductal confluence. Some calculi may be as large as 1 cm. 4. Signs of nodal enlargement in the upper abdomen and even in lower chest raising the question of nodal involvement. 5. Query left supraclavicular nodal enlargement, chest imaging may be helpful given findings above. 6. Small solid and enhancing lesion in the lower pole of the right kidney.   Colonoscopy by Dr. Paulita Fujita 07/24/2011: 1. Diminutive colon hyperplastic polyp removed 2. Diminutive rectal submucosal appearing nodule, worrisome for carcinoid. Biopsies showed benign lymphoid aggregate. 3. Otherwise normal colonoscopy, exam limited by fair prep.     Past Medical History:  Diagnosis Date  . Cirrhosis ([place])   . Diabetes mellitus 2012  . Diverticulosis   . Gallbladder sludge   . Hepatitis 2010   history of Hepatitis C  . Hepatitis C   . Hypertension 2011  . Obesity     Past Surgical History:  Procedure Laterality Date  . COLONOSCOPY  07/24/2011   Procedure: COLONOSCOPY;  Surgeon: Landry Dyke, MD;  Location: [facility];  Service: Endoscopy;  Laterality: N/A;  . Dental Surgery Tooth Extraction      Prior to Admission medications   Medication Sig Start Date End Date Taking? Authorizing Provider  allopurinol (ZYLOPRIM) 100 MG tablet Take 1 tablet (100 mg total) by mouth 3 (three) times daily after meals. 05/26/19  Yes Nche, Charlene Brooke, NP  amLODipine (NORVASC) 10 MG tablet Take 1 tablet (10 mg total) by mouth at bedtime. 05/26/19  Yes Nche, Charlene Brooke, NP  atorvastatin (LIPITOR) 20 MG tablet Take 1 tablet (20 mg total) by mouth daily at 6 PM. 05/26/19  Yes Nche, Charlene Brooke, NP    cholecalciferol (VITAMIN D) 400 UNITS TABS tablet Take 400 Units by mouth daily.   Yes [provider]  Lecithin 1200 MG CAPS Take by mouth.   Yes [provider]  metFORMIN (GLUCOPHAGE) 500 MG tablet Take 1 tablet (500 mg total) by mouth 2 (two) times daily with a meal. 05/26/19  Yes Nche, Charlene Brooke, NP  metoprolol succinate (TOPROL-XL) 25 MG 24 hr tablet Take 1 tablet (25 mg total) by mouth daily. Take with or immediately following a meal. 05/26/19  Yes Nche, Charlene Brooke, NP  Milk Thistle 1000 MG CAPS Take by mouth.   Yes [provider]  olmesartan (BENICAR) 5 MG tablet Take 1 tablet (5 mg total) by mouth daily. 06/30/19  Yes Nche, Charlene Brooke, NP  OVER THE COUNTER MEDICATION Curamin for pain relive.   Yes [provider]  glucose blood test strip Test blood glucose once daily. One Touch-Delica test strips 2/45/80   Nche, Charlene Brooke, NP  nystatin-triamcinolone (MYCOLOG II) cream Apply 1 application topically 2 (two) times daily. Patient not taking: Reported on 09/20/2019 06/30/19   Nche, Charlene Brooke, NP    Current Facility-Administered Medications  Medication Dose Route Frequency Provider Last Rate Last Admin  .  0.9 %  sodium chloride infusion   Intravenous Continuous Jani Gravel, MD 75 mL/hr at 09/20/19 2247 New Bag at 09/20/19 2247  . amLODipine (NORVASC) tablet 10 mg  10 mg Oral QHS Jani Gravel, MD      . insulin aspart (novoLOG) injection 0-9 Units  0-9 Units Subcutaneous Q4H Jani Gravel, MD      . irbesartan (AVAPRO) tablet 37.5 mg  37.5 mg Oral Daily Jani Gravel, MD      . metoprolol succinate (TOPROL-XL) 24 hr tablet 25 mg  25 mg Oral Daily Jani Gravel, MD        Allergies as of 09/20/2019 - Review Complete 09/20/2019  Allergen Reaction Noted  . Lisinopril Swelling 10/13/2018    Family History  Problem Relation Age of Onset  . Diabetes Sister   . Cancer Maternal Grandmother        leukemia   . Diabetes Maternal Grandfather   .  Diabetes Paternal Grandfather   . Diabetes Paternal Grandmother   . Hypertension Sister   . Hypertension Brother   . Anesthesia problems Neg Hx     Social History   Socioeconomic History  . Marital status: Married    Spouse name: Not on file  . Number of children: Not on file  . Years of education: Not on file  . Highest education level: Not on file  Occupational History  . Not on file  Tobacco Use  . Smoking status: Current Some Day Smoker    Packs/day: 0.50    Types: Cigarettes  . Smokeless tobacco: Never Used  Substance and Sexual Activity  . Alcohol use: Not Currently    Alcohol/week: 30.0 standard drinks    Types: 24 Cans of beer, 6 Shots of liquor per week    Comment: weekends  . Drug use: No    Comment: previously   . Sexual activity: Yes    Partners: Male    Birth control/protection: Post-menopausal  Other Topics Concern  . Not on file  Social History Narrative   Previous [facility] pt.  Last MD was Dr. Delman Cheadle, seen 12/2011      Works part time in [employer] with husband   Social Determinants of Health   Financial Resource Strain:   . Difficulty of Paying Living Expenses: Not on file  Food Insecurity:   . Worried About Charity fundraiser in the Last Year: Not on file  . Ran Out of Food in the Last Year: Not on file  Transportation Needs:   . Lack of Transportation (Medical): Not on file  . Lack of Transportation (Non-Medical): Not on file  Physical Activity:   . Days of Exercise per Week: Not on file  . Minutes of Exercise per Session: Not on file  Stress:   . Feeling of Stress : Not on file  Social Connections:   . Frequency of Communication with Friends and Family: Not on file  . Frequency of Social Gatherings with Friends and Family: Not on file  . Attends Religious Services: Not on file  . Active Member of Clubs or Organizations: Not on file  . Attends Archivist Meetings: Not on file  . Marital  Status: Not on file  Intimate Partner Violence:   . Fear of Current or Ex-Partner: Not on file  . Emotionally Abused: Not on file  . Physically Abused: Not on file  . Sexually Abused: Not on file    Review  of Systems: Gen: + 20 lb weight loss. Denies fever, sweats or chills.  CV: Denies chest pain, palpitations or edema. Resp: Denies cough, shortness of breath of hemoptysis.  GI: See HPI.   GU : + dark teat colored urine.  MS: + shoulder pain. Derm: + itchiness to abdomen.  Psych: Denies depression, anxiety, memory loss, suicidal ideation or confusion. Heme: Denies easy bruising, bleeding. Neuro:  Denies headaches, dizziness or paresthesias. Endo:  Denies any problems with DM, thyroid or adrenal function.  Physical Exam: Vital signs in last 24 hours: Temp:  [97 F (36.1 C)-99.1 F (37.3 C)] 98.6 F (37 C) (02/02 0401) Pulse Rate:  [65-75] 65 (02/02 0401) Resp:  [17-19] 18 (02/02 0401) BP: (128-168)/(66-91) 142/66 (02/02 0401) SpO2:  [96 %-100 %] 100 % (02/02 0401) Weight:  [94 kg-94.9 kg] 94 kg (02/02 0424) Last BM Date: 09/19/19 General:  Alert,  well-developed, well-nourished, pleasant and cooperative in NAD. Head:  Normocephalic and atraumatic. Eyes:  Significant scleral icterus. Conjunctiva pink. Ears:  Normal auditory acuity. Nose:  No deformity, discharge or lesions. Mouth: No deformity or lesions.  Missing dentition.  Neck:  Supple. No lymphadenopathy or thyromegaly.  Lungs:  Breath sounds clear throughout.  Heart:  RRR, no murmur.  Abdomen: Soft, nondistended, no masses or organomegaly. + BS x 4 quads.   Rectal:  Deferred. Msk:  Symmetrical without gross deformities.  Pulses:  Normal pulses noted. Extremities:  Without clubbing or edema. Neurologic:  Alert and  oriented x4. No focal deficits.  Skin:  Intact without significant lesions or rashes. Psych:  Alert and cooperative. Normal mood and affect.  Intake/Output from previous day: 02/01 0701 - 02/02  0700 In: 389.6 [I.V.:389.6] Out: -  Intake/Output this shift: Total I/O In: 389.6 [I.V.:389.6] Out: -   Lab Results: Recent Labs    09/20/19 1124 09/21/19 0509  WBC 11.8* 11.0*  HGB 11.5* 10.8*  HCT 33.9* 31.9*  PLT 415.0 Repeated and verified X2.* 397   BMET Recent Labs    09/20/19 1124  NA 129*  K 3.1*  CL 92*  CO2 26  GLUCOSE 111*  BUN 11  CREATININE 0.98  CALCIUM 9.4   LFT Recent Labs    09/20/19 1124  PROT 7.4  ALBUMIN 3.4*  AST 248*  ALT 157*  ALKPHOS 601*  BILITOT 26.6*  23.7*  BILIDIR >10.0*  17.3*   PT/INR Recent Labs    09/20/19 1124  LABPROT 14.2*  INR 1.3*   Hepatitis Panel No results for input(s): HEPBSAG, HCVAB, HEPAIGM, HEPBIGM in the last 72 hours.    Studies/Results: MR ABDOMEN MRCP W WO CONTAST  Result Date: 09/20/2019 CLINICAL DATA:  Jaundice. EXAM: MRI ABDOMEN WITHOUT AND WITH CONTRAST (INCLUDING MRCP) TECHNIQUE: Multiplanar multisequence MR imaging of the abdomen was performed both before and after the administration of intravenous contrast. Heavily T2-weighted images of the biliary and pancreatic ducts were obtained, and three-dimensional MRCP images were rendered by post processing. CONTRAST:  44m GADAVIST GADOBUTROL 1 MMOL/ML IV SOLN COMPARISON:  Abdominal sonogram of 09/20/2019 FINDINGS: Lower chest: Limited assessment of the lower chest shows no effusion or consolidation. Study limited by patient body habitus overall. 13 mm pre pericardial lymph node is nonspecific. There is also a suggestion of irregularity of soft tissues in the left left supraclavicular region. This could represent nodal disease given findings below. Hepatobiliary: Signs of irregular filling defect in the duodenum, associated with biliary and pancreatic ductal dilation and pancreatic atrophy. Common bile duct is largest 15 mm  with marked distension of the intrahepatic biliary tree. Common bile duct with tapered narrowing to the level of the ampulla. Sludge in  the dependent gallbladder and small stones. Also likely with sludge in the common bile duct and dependent low signal intensity on reconstructed MRCP sequences that suggests either small stones or biliary sludge in the dependent portion of the distal common bile duct. No visible intrahepatic calculi. Pancreas: Marked dilation of dorsal pancreatic duct with signs of pancreatic atrophy in intraductal calculi suggested on T2 and MRCP sequences some as large is a cm and mainly present near the ductal confluence. Ampullary filling defect as outlined above. Ductal caliber in maximum axial dimension 14 mm of the pancreatic duct. In addition to extension into the ampulla there is abnormal soft tissue involving the pancreatic head best seen on subtraction images, image 74 of postcontrast data measuring approximately 4.4 x 3.2 cm in greatest axial dimension including extension into the lumen of the duodenum. Spleen:  Spleen is normal size without suspicious lesion. Adrenals/Urinary Tract: Adrenal glands are normal. Small part solid and enhancing renal lesion arises from the lower pole of the right kidney measuring 11 x 9 mm. No signs of hydronephrosis. Stomach/Bowel: No sign of bowel obstruction. Masslike area in the region of the ampulla filling the ampullary portion of the duodenum may measure as large as 3.5 x 3.1 cm in greatest axial dimension and results in ductal obstruction of both pancreatic and biliary duct. Lobular contour of the pancreatic tail with heterogeneity, question of small lesion in this location as well 1.3 cm (image 49, venous phase data set, postcontrast images. Vascular/Lymphatic: Bulky periportal lymph nodes suspicious for neoplastic/metastatic lymph nodes based on location in the appearance of the duodenum. Also with enlarged hepatic gastric lymph nodes. (Image 59, post-contrast images) 1.4 cm portacaval lymph node (Image 46, post-contrast images 9 mm hepatic gastric lymph node. Other: Portal vein and  SMV are patent. SMA is patent. No signs of soft tissue in case mint of vascular structures in the upper abdomen. Musculoskeletal: No suspicious bone lesions identified. IMPRESSION: 1. Signs of pancreatic/ampullary mass with extension into duodenum and obstruction of biliary and pancreatic ducts. 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 Endoscopic assessment is suggested. 2. Signs of pancreatic atrophy with lobular area in the pancreatic tail the raise the question of second small lesion versus is heterogeneous pancreas with signs of prior inflammation. 3. Biliary and pancreatic ductal obstruction with sludge and/or small stones layering dependently in the common bile duct and intraductal calculi suspected in the dilated pancreatic duct near the biliary ductal and pancreatic ductal confluence. Some calculi may be as large as 1 cm. 4. Signs of nodal enlargement in the upper abdomen and even in lower chest raising the question of nodal involvement. 5. Query left supraclavicular nodal enlargement, chest imaging may be helpful given findings above. 6. Small solid and enhancing lesion in the lower pole of the right kidney. Recommend attention on follow-up imaging. This recommendation follows ACR consensus guidelines: White Paper of the ACR Incidental Findings Committee II on Vascular Findings. J Am Coll Radiol 2013; 10:789-794. 7. These results will be called to the ordering clinician or representative by the Radiologist Assistant, and communication documented in the PACS or zVision Dashboard. Electronically Signed   By: Zetta Bills M.D.   On: 09/20/2019 22:10   US Abdomen Limited RUQ  Result Date: 09/20/2019 CLINICAL DATA:  64 year old female with elevated bilirubin. EXAM: ULTRASOUND ABDOMEN LIMITED RIGHT UPPER QUADRANT COMPARISON:  Ultrasound dated 08/19/2019. FINDINGS: Gallbladder: There is sludge within the gallbladder. No definite shadowing stone, gallbladder wall  thickening, or pericholecystic fluid. Negative sonographic Murphy's sign. Common bile duct: Diameter: 16 mm. Interval increase in the size of the common bile duct as well as development of mild intrahepatic biliary ductal dilatation since the prior ultrasound. Correlation with clinical exam, and LFTs recommended. MRCP may provide better evaluation. Liver: There is heterogeneously increased liver echogenicity most commonly seen in the setting of fatty infiltration. Superimposed inflammation or fibrosis is not excluded. Clinical correlation is recommended. Portal vein is patent on color Doppler imaging with normal direction of blood flow towards the liver. Other: None. IMPRESSION: 1. Moderate amount of gallbladder sludge. No sonographic findings of acute cholecystitis. 2. Dilatation of the common bile duct, progressed since the prior ultrasound with interval development of mild intrahepatic biliary ductal dilatation. Clinical correlation is recommended. MRCP may provide better evaluation. 3. Probable fatty liver or changes of cirrhosis. Electronically Signed   By: Anner Crete M.D.   On: 09/20/2019 18:38    IMPRESSION/PLAN:  54. 64 year old female with obstructive jaundice. Abdominal MRI/MRCP 09/19/2018 identified biliary and pancreatic duct obstruction with sludge or stones in the CBD and intraductal calculi in the dilated pancreatic duct (some calculi may be as large as 1cm), a pancreatic/ampullary mass which extends into the duodenum with obstruction of the biliary and pancreatic ducts concerning for ampullary vs pancreatic adenocarcinoma and a questionable small lesion to the tail of the pancreas.  T. Bili 21.5. Alk phos 450. AST 236. ALT 138. WBC 11.8 >>11.0. She remains afebrile. At risk for ascending cholangitis.  -ERCP/EUS to be scheduled with Dr.  Rush Landmark Wed 09/22/2019. Benefits and risks discussed including risk with general anesthesia, bleeding, perforation, infection and pancreatitis  -Clear  liquid diet today -NPO after midnight  -IVF per the hospitalist  -Zosyn IV 3.378m Q 8 hours   2. Chronic Hepatitis C GT1a with cirrhosis treated with Harvoni in 2019 with SVR.  -Hep C RNA Quant -Eventual EGD to survey for esophageal varices  -further follow up as outpatient   3. Normocytic Anemia. Hg 11.5 >> 10.8. HCT 33.9 >>31.9. MCV 88.6. No signs of active GI bleeding.  -repeat CBC in am  4. Hyponatremia  5. Hypokalemia  6. DM II  7. History of colon polyps -eventual colonoscopy as an outpatient   8. Alcohol abuse. No alcohol for 2 months.  -continue alcohol abstinence   9. Further recommendations per Dr. MCorena PilgrimMDorathy Daft 09/21/2019, 5:41 AM

## 2019-09-21 NOTE — Progress Notes (Signed)
CRITICAL VALUE ALERT  Critical Value: Bilirubin  21.5  Date & Time Notied:  09/21/19 0711  Provider Notified: Maren Beach  Orders Received/Actions taken: * Awaiting

## 2019-09-21 NOTE — Progress Notes (Signed)
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    GOAL:   Patient will meet greater than or equal to 90% of their needs    MONITOR:   PO intake, Labs, Weight trends, I & O's, Supplement acceptance, Diet advancement  REASON FOR ASSESSMENT:   Malnutrition Screening Tool    ASSESSMENT:  64 year old female with past medical history of HTN, DM2, obesity, Hep C  s/p treatment, cirrhosis secondary to alcohol abuse presented to ED per PCP due to abnormal liver function test. Patient reports 2 week history of jaundice, dark colored urine, and decreased appetite. In ED, pt underwent right upper quadrant ultrasound and MRCP that was concerning for ampullary adenocarcinoma or pancreatic adenocarcinoma.  Per chart, pt noticed her eyes appeared juandice, and was experiencing gray brown stools and tea colored uring at the end of December 2020. Patient concerned about symptoms and stopped drinking alcohol at that time. For many years, pt reported 3 (12oz) beers or 1 pint of Vodka daily.   Very delightful patient sitting in bedside chair eating CL lunch at RD visit. Patient reports feeling very hungry and stated that she was going to drink everything that was on her tray. Patient usually eats 3 larger meals/day, reports having a healthy appetite until recently.  She endorses decreased appetite over the past couple of months, recalls a few bites of applesauce with toast for breakfast and bites of broiled flounder with collards for dinner.   Patient  evaluated by GI, per notes ERCP/EUS to be scheduled 09/22/19 and will be NPO at midnight for pending procedure. Currently, pt on CL diet, will provide Boost Breeze to aid with calorie/protien needs.   Non-pitting BLE edema noted per RN flowsheet.  UBW 240lbs approximately 4-5 months per pt report Current wt 94 kg (206.8 lbs)  Weights have been slowly trending down over the past year per wt history, noted 18.7lb (8.3%) wt loss in the 3 months which is severe for time.  09/20/19 94.9 kg  08/11/19 98.8 kg  06/30/19 102.5 kg  05/25/19 102.9 kg  11/06/18 109.3 kg  10/22/18 109.7 kg  10/13/18 108.9 kg  10/05/18 108.8 kg   Patient meets criteria for severe malnutrition in the context of chronic disease given weight losses, pt dietary recall meeting < 75% of needs > 1 month as well as moderate fat and muscle depletions noted on exam. Encouraged incorporating daily nutrition supplement at home with decreased intake; RD will provide pt with nutrition supplements during admission.    NUTRITION - FOCUSED PHYSICAL EXAM: Limited exam due to patient eating lunch Findings: Moderate fat depletion to orbital and buccal regions; Mild muscle depletion to clavicle and clavicle acromion region; moderate muscle depletion to temple and dorsal hand regions   Diet Order:   Diet Order            Diet NPO time specified  Diet effective midnight        Diet NPO time specified  Diet effective midnight        Diet clear liquid Room service appropriate? Yes; Fluid consistency: Thin  Diet effective now              EDUCATION NEEDS:   Education needs have been addressed  Skin:  Skin Assessment: Reviewed RN Assessment  Last BM:  PTA, pt unsure  Height:   Ht Readings from Last 1 Encounters:  09/20/19 5\' 3"  (1.6 m)    Weight:   Wt Readings from Last 1 Encounters:  09/21/19 94 kg    Ideal Body Weight:  52.3 kg  BMI:  Body mass index is 36.71 kg/m.  Estimated Nutritional Needs:   Kcal:   1900-2100  Protein:  95-105  Fluid:  >/= 1.9 L/day    Lajuan Lines, RD, LDN Clinical Nutrition Office Telephone [phone] After Hours/Weekend Pager: [phone]

## 2019-09-21 NOTE — H&P (View-Only) (Signed)
Referring Provider: Dr. Jani Gravel  Primary Care Physician:  Nche, Charlene Brooke, NP Primary Gastroenterologist:  Althia Forts, previously Dr. Paulita Fujita  Reason for Consultation:  Elevated LFTs, obstructive jaundice   HPI: Melanie Alvarez is a 64 y.o. female with a past medical history of hypertension, DM II, alcohol abuse, chronic hepatitis C with cirrhosis. Past d & C. No surgical history. She was seen by Wilfred Lacy NP at [facility] on 09/21/2019 with complaints of ear fullness and balance issues. She also reported being jaundiced for the past 2 months. Laboratory studies showed elevated LFTs and she was sent to [facility] ED for further evaluation.  She noticed her eyes appeared jaundiced at the end of Dec. 2020. She also noticed her stool color was gray brown, urine color was tea color and she had itchiness to her abdomen. She was concerned about these symptoms so she stopped drinking alcohol end of Dec. 2020. She was drinking 3 (12 oz) cans of beer daily or 1 pink of Vodka daily for many years. No drug use. She denies any history of pancreatitis. She has lost 20 lbs over the past 2 to 3 months. Weight was 226lbs 06/2019. Weight today 206 lbs. No fever, sweats or chills. No nausea or vomiting. Appetite is fair. She is passing gray brown stools daily, last BM was a few days ago. No rectal bleeding or melena. She infrequently takes Ibuprofen 811m 1 tab po for right and left shoulder pain, last dose taken 2 or 3 days ago. She also takes Aleve 2210m1 tab po as needed, she has takes one Aleve tab approximately 6 times monthly.  No chest pain or SOB. No GERD symptoms or dysphagia. Labs in the ED showed acute cholestasis.  T. Bili 23.7. Alk phos 601. AST 248. ALT 157. An Abdominal MRI/MRCP showed a pancreatic/ampullary mass, dilated CBD with obstruction of the biliary and pancreatic ducts concerning for ampullary verses pancreatic adenocarcinoma. See results below. GI consult requested  for further evaluation, ERCP with EUS.   She has a history of chronic hepatitis C GT1a followed by infectious disease specialist, Dr. CoLinus SalmonsShe was treated with Harvoni x 12 weeks 12/2017 with SVR. Her initial elastography showed F3/F4 and an abdominal sonogram did not show evidence of cirrhosis. An abdominal sono 2019 showed evidence of cirrhosis. No  thrombocytopenia. Her most recent surveillance abdominal sonogram 08/19/2019 which showed evidence of cirrhosis without any focal liver lesions. She received Hep A and Hep B vaccines in 20219. Her most recent Hep C RNA quant 07/21/2018 was negative. She's never had an EGD to survey for esophageal varices. She was referred to our office by Dr. CoLinus Salmonsor an EGD. Her initial office appointment is scheduled with Amy Esterewood PA-C 10/05/2019.   No family history of gastric, colon or pancreatic or liver cancer.   ED Course: Na 129. K 3.1. Glu 111. BUN 11. Cr. 0.98. Ca 9.4. Alk phos 601. Albumin 3.4. AST 248. ALT 157. T. Bili 23.7. Repeat T. Bili 26.6. Direct bili > 10. WBC 11.8. Hg 11.5. HCT 33.9. MCV 89.1. PLT 415. INR 1.3. SARS COVID 19 negative.. Influenza A & B negative. Urine protein + and WBC 11-20.   Chest Xray: Negative chest.  RUQ sonogram: 1. Moderate amount of gallbladder sludge. No sonographic findings of acute cholecystitis. 2. Dilatation of the common bile duct, progressed since the prior ultrasound with interval development of mild intrahepatic biliary ductal dilatation. Clinical correlation is recommended. MRCP may provide better evaluation.  3. Probable fatty liver or changes of cirrhosis.   Abdominal MRI/MRCP 1. Signs of pancreatic/ampullary mass with extension into duodenum and obstruction of biliary and pancreatic ducts. 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 Endoscopic assessment is suggested. 2. Signs of pancreatic atrophy with lobular area in the pancreatic tail the raise the  question of second small lesion versus is heterogeneous pancreas with signs of prior inflammation. 3. Biliary and pancreatic ductal obstruction with sludge and/or small stones layering dependently in the common bile duct and intraductal calculi suspected in the dilated pancreatic duct near the biliary ductal and pancreatic ductal confluence. Some calculi may be as large as 1 cm. 4. Signs of nodal enlargement in the upper abdomen and even in lower chest raising the question of nodal involvement. 5. Query left supraclavicular nodal enlargement, chest imaging may be helpful given findings above. 6. Small solid and enhancing lesion in the lower pole of the right kidney.   Colonoscopy by Dr. Paulita Fujita 07/24/2011: 1. Diminutive colon hyperplastic polyp removed 2. Diminutive rectal submucosal appearing nodule, worrisome for carcinoid. Biopsies showed benign lymphoid aggregate. 3. Otherwise normal colonoscopy, exam limited by fair prep.     Past Medical History:  Diagnosis Date  . Cirrhosis ([place])   . Diabetes mellitus 2012  . Diverticulosis   . Gallbladder sludge   . Hepatitis 2010   history of Hepatitis C  . Hepatitis C   . Hypertension 2011  . Obesity     Past Surgical History:  Procedure Laterality Date  . COLONOSCOPY  07/24/2011   Procedure: COLONOSCOPY;  Surgeon: Landry Dyke, MD;  Location: [facility];  Service: Endoscopy;  Laterality: N/A;  . Dental Surgery Tooth Extraction      Prior to Admission medications   Medication Sig Start Date End Date Taking? Authorizing Provider  allopurinol (ZYLOPRIM) 100 MG tablet Take 1 tablet (100 mg total) by mouth 3 (three) times daily after meals. 05/26/19  Yes Nche, Charlene Brooke, NP  amLODipine (NORVASC) 10 MG tablet Take 1 tablet (10 mg total) by mouth at bedtime. 05/26/19  Yes Nche, Charlene Brooke, NP  atorvastatin (LIPITOR) 20 MG tablet Take 1 tablet (20 mg total) by mouth daily at 6 PM. 05/26/19  Yes Nche, Charlene Brooke, NP    cholecalciferol (VITAMIN D) 400 UNITS TABS tablet Take 400 Units by mouth daily.   Yes [provider]  Lecithin 1200 MG CAPS Take by mouth.   Yes [provider]  metFORMIN (GLUCOPHAGE) 500 MG tablet Take 1 tablet (500 mg total) by mouth 2 (two) times daily with a meal. 05/26/19  Yes Nche, Charlene Brooke, NP  metoprolol succinate (TOPROL-XL) 25 MG 24 hr tablet Take 1 tablet (25 mg total) by mouth daily. Take with or immediately following a meal. 05/26/19  Yes Nche, Charlene Brooke, NP  Milk Thistle 1000 MG CAPS Take by mouth.   Yes [provider]  olmesartan (BENICAR) 5 MG tablet Take 1 tablet (5 mg total) by mouth daily. 06/30/19  Yes Nche, Charlene Brooke, NP  OVER THE COUNTER MEDICATION Curamin for pain relive.   Yes [provider]  glucose blood test strip Test blood glucose once daily. One Touch-Delica test strips 2/45/80   Nche, Charlene Brooke, NP  nystatin-triamcinolone (MYCOLOG II) cream Apply 1 application topically 2 (two) times daily. Patient not taking: Reported on 09/20/2019 06/30/19   Nche, Charlene Brooke, NP    Current Facility-Administered Medications  Medication Dose Route Frequency Provider Last Rate Last Admin  .  0.9 %  sodium chloride infusion   Intravenous Continuous Jani Gravel, MD 75 mL/hr at 09/20/19 2247 New Bag at 09/20/19 2247  . amLODipine (NORVASC) tablet 10 mg  10 mg Oral QHS Jani Gravel, MD      . insulin aspart (novoLOG) injection 0-9 Units  0-9 Units Subcutaneous Q4H Jani Gravel, MD      . irbesartan (AVAPRO) tablet 37.5 mg  37.5 mg Oral Daily Jani Gravel, MD      . metoprolol succinate (TOPROL-XL) 24 hr tablet 25 mg  25 mg Oral Daily Jani Gravel, MD        Allergies as of 09/20/2019 - Review Complete 09/20/2019  Allergen Reaction Noted  . Lisinopril Swelling 10/13/2018    Family History  Problem Relation Age of Onset  . Diabetes Sister   . Cancer Maternal Grandmother        leukemia   . Diabetes Maternal Grandfather   .  Diabetes Paternal Grandfather   . Diabetes Paternal Grandmother   . Hypertension Sister   . Hypertension Brother   . Anesthesia problems Neg Hx     Social History   Socioeconomic History  . Marital status: Married    Spouse name: Not on file  . Number of children: Not on file  . Years of education: Not on file  . Highest education level: Not on file  Occupational History  . Not on file  Tobacco Use  . Smoking status: Current Some Day Smoker    Packs/day: 0.50    Types: Cigarettes  . Smokeless tobacco: Never Used  Substance and Sexual Activity  . Alcohol use: Not Currently    Alcohol/week: 30.0 standard drinks    Types: 24 Cans of beer, 6 Shots of liquor per week    Comment: weekends  . Drug use: No    Comment: previously   . Sexual activity: Yes    Partners: Male    Birth control/protection: Post-menopausal  Other Topics Concern  . Not on file  Social History Narrative   Previous [facility] pt.  Last MD was Dr. Delman Cheadle, seen 12/2011      Works part time in [employer] with husband   Social Determinants of Health   Financial Resource Strain:   . Difficulty of Paying Living Expenses: Not on file  Food Insecurity:   . Worried About Charity fundraiser in the Last Year: Not on file  . Ran Out of Food in the Last Year: Not on file  Transportation Needs:   . Lack of Transportation (Medical): Not on file  . Lack of Transportation (Non-Medical): Not on file  Physical Activity:   . Days of Exercise per Week: Not on file  . Minutes of Exercise per Session: Not on file  Stress:   . Feeling of Stress : Not on file  Social Connections:   . Frequency of Communication with Friends and Family: Not on file  . Frequency of Social Gatherings with Friends and Family: Not on file  . Attends Religious Services: Not on file  . Active Member of Clubs or Organizations: Not on file  . Attends Archivist Meetings: Not on file  . Marital  Status: Not on file  Intimate Partner Violence:   . Fear of Current or Ex-Partner: Not on file  . Emotionally Abused: Not on file  . Physically Abused: Not on file  . Sexually Abused: Not on file    Review  of Systems: Gen: + 20 lb weight loss. Denies fever, sweats or chills.  CV: Denies chest pain, palpitations or edema. Resp: Denies cough, shortness of breath of hemoptysis.  GI: See HPI.   GU : + dark teat colored urine.  MS: + shoulder pain. Derm: + itchiness to abdomen.  Psych: Denies depression, anxiety, memory loss, suicidal ideation or confusion. Heme: Denies easy bruising, bleeding. Neuro:  Denies headaches, dizziness or paresthesias. Endo:  Denies any problems with DM, thyroid or adrenal function.  Physical Exam: Vital signs in last 24 hours: Temp:  [97 F (36.1 C)-99.1 F (37.3 C)] 98.6 F (37 C) (02/02 0401) Pulse Rate:  [65-75] 65 (02/02 0401) Resp:  [17-19] 18 (02/02 0401) BP: (128-168)/(66-91) 142/66 (02/02 0401) SpO2:  [96 %-100 %] 100 % (02/02 0401) Weight:  [94 kg-94.9 kg] 94 kg (02/02 0424) Last BM Date: 09/19/19 General:  Alert,  well-developed, well-nourished, pleasant and cooperative in NAD. Head:  Normocephalic and atraumatic. Eyes:  Significant scleral icterus. Conjunctiva pink. Ears:  Normal auditory acuity. Nose:  No deformity, discharge or lesions. Mouth: No deformity or lesions.  Missing dentition.  Neck:  Supple. No lymphadenopathy or thyromegaly.  Lungs:  Breath sounds clear throughout.  Heart:  RRR, no murmur.  Abdomen: Soft, nondistended, no masses or organomegaly. + BS x 4 quads.   Rectal:  Deferred. Msk:  Symmetrical without gross deformities.  Pulses:  Normal pulses noted. Extremities:  Without clubbing or edema. Neurologic:  Alert and  oriented x4. No focal deficits.  Skin:  Intact without significant lesions or rashes. Psych:  Alert and cooperative. Normal mood and affect.  Intake/Output from previous day: 02/01 0701 - 02/02  0700 In: 389.6 [I.V.:389.6] Out: -  Intake/Output this shift: Total I/O In: 389.6 [I.V.:389.6] Out: -   Lab Results: Recent Labs    09/20/19 1124 09/21/19 0509  WBC 11.8* 11.0*  HGB 11.5* 10.8*  HCT 33.9* 31.9*  PLT 415.0 Repeated and verified X2.* 397   BMET Recent Labs    09/20/19 1124  NA 129*  K 3.1*  CL 92*  CO2 26  GLUCOSE 111*  BUN 11  CREATININE 0.98  CALCIUM 9.4   LFT Recent Labs    09/20/19 1124  PROT 7.4  ALBUMIN 3.4*  AST 248*  ALT 157*  ALKPHOS 601*  BILITOT 26.6*  23.7*  BILIDIR >10.0*  17.3*   PT/INR Recent Labs    09/20/19 1124  LABPROT 14.2*  INR 1.3*   Hepatitis Panel No results for input(s): HEPBSAG, HCVAB, HEPAIGM, HEPBIGM in the last 72 hours.    Studies/Results: MR ABDOMEN MRCP W WO CONTAST  Result Date: 09/20/2019 CLINICAL DATA:  Jaundice. EXAM: MRI ABDOMEN WITHOUT AND WITH CONTRAST (INCLUDING MRCP) TECHNIQUE: Multiplanar multisequence MR imaging of the abdomen was performed both before and after the administration of intravenous contrast. Heavily T2-weighted images of the biliary and pancreatic ducts were obtained, and three-dimensional MRCP images were rendered by post processing. CONTRAST:  44m GADAVIST GADOBUTROL 1 MMOL/ML IV SOLN COMPARISON:  Abdominal sonogram of 09/20/2019 FINDINGS: Lower chest: Limited assessment of the lower chest shows no effusion or consolidation. Study limited by patient body habitus overall. 13 mm pre pericardial lymph node is nonspecific. There is also a suggestion of irregularity of soft tissues in the left left supraclavicular region. This could represent nodal disease given findings below. Hepatobiliary: Signs of irregular filling defect in the duodenum, associated with biliary and pancreatic ductal dilation and pancreatic atrophy. Common bile duct is largest 15 mm  with marked distension of the intrahepatic biliary tree. Common bile duct with tapered narrowing to the level of the ampulla. Sludge in  the dependent gallbladder and small stones. Also likely with sludge in the common bile duct and dependent low signal intensity on reconstructed MRCP sequences that suggests either small stones or biliary sludge in the dependent portion of the distal common bile duct. No visible intrahepatic calculi. Pancreas: Marked dilation of dorsal pancreatic duct with signs of pancreatic atrophy in intraductal calculi suggested on T2 and MRCP sequences some as large is a cm and mainly present near the ductal confluence. Ampullary filling defect as outlined above. Ductal caliber in maximum axial dimension 14 mm of the pancreatic duct. In addition to extension into the ampulla there is abnormal soft tissue involving the pancreatic head best seen on subtraction images, image 74 of postcontrast data measuring approximately 4.4 x 3.2 cm in greatest axial dimension including extension into the lumen of the duodenum. Spleen:  Spleen is normal size without suspicious lesion. Adrenals/Urinary Tract: Adrenal glands are normal. Small part solid and enhancing renal lesion arises from the lower pole of the right kidney measuring 11 x 9 mm. No signs of hydronephrosis. Stomach/Bowel: No sign of bowel obstruction. Masslike area in the region of the ampulla filling the ampullary portion of the duodenum may measure as large as 3.5 x 3.1 cm in greatest axial dimension and results in ductal obstruction of both pancreatic and biliary duct. Lobular contour of the pancreatic tail with heterogeneity, question of small lesion in this location as well 1.3 cm (image 49, venous phase data set, postcontrast images. Vascular/Lymphatic: Bulky periportal lymph nodes suspicious for neoplastic/metastatic lymph nodes based on location in the appearance of the duodenum. Also with enlarged hepatic gastric lymph nodes. (Image 59, post-contrast images) 1.4 cm portacaval lymph node (Image 46, post-contrast images 9 mm hepatic gastric lymph node. Other: Portal vein and  SMV are patent. SMA is patent. No signs of soft tissue in case mint of vascular structures in the upper abdomen. Musculoskeletal: No suspicious bone lesions identified. IMPRESSION: 1. Signs of pancreatic/ampullary mass with extension into duodenum and obstruction of biliary and pancreatic ducts. 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 Endoscopic assessment is suggested. 2. Signs of pancreatic atrophy with lobular area in the pancreatic tail the raise the question of second small lesion versus is heterogeneous pancreas with signs of prior inflammation. 3. Biliary and pancreatic ductal obstruction with sludge and/or small stones layering dependently in the common bile duct and intraductal calculi suspected in the dilated pancreatic duct near the biliary ductal and pancreatic ductal confluence. Some calculi may be as large as 1 cm. 4. Signs of nodal enlargement in the upper abdomen and even in lower chest raising the question of nodal involvement. 5. Query left supraclavicular nodal enlargement, chest imaging may be helpful given findings above. 6. Small solid and enhancing lesion in the lower pole of the right kidney. Recommend attention on follow-up imaging. This recommendation follows ACR consensus guidelines: White Paper of the ACR Incidental Findings Committee II on Vascular Findings. J Am Coll Radiol 2013; 10:789-794. 7. These results will be called to the ordering clinician or representative by the Radiologist Assistant, and communication documented in the PACS or zVision Dashboard. Electronically Signed   By: Zetta Bills M.D.   On: 09/20/2019 22:10   US Abdomen Limited RUQ  Result Date: 09/20/2019 CLINICAL DATA:  64 year old female with elevated bilirubin. EXAM: ULTRASOUND ABDOMEN LIMITED RIGHT UPPER QUADRANT COMPARISON:  Ultrasound dated 08/19/2019. FINDINGS: Gallbladder: There is sludge within the gallbladder. No definite shadowing stone, gallbladder wall  thickening, or pericholecystic fluid. Negative sonographic Murphy's sign. Common bile duct: Diameter: 16 mm. Interval increase in the size of the common bile duct as well as development of mild intrahepatic biliary ductal dilatation since the prior ultrasound. Correlation with clinical exam, and LFTs recommended. MRCP may provide better evaluation. Liver: There is heterogeneously increased liver echogenicity most commonly seen in the setting of fatty infiltration. Superimposed inflammation or fibrosis is not excluded. Clinical correlation is recommended. Portal vein is patent on color Doppler imaging with normal direction of blood flow towards the liver. Other: None. IMPRESSION: 1. Moderate amount of gallbladder sludge. No sonographic findings of acute cholecystitis. 2. Dilatation of the common bile duct, progressed since the prior ultrasound with interval development of mild intrahepatic biliary ductal dilatation. Clinical correlation is recommended. MRCP may provide better evaluation. 3. Probable fatty liver or changes of cirrhosis. Electronically Signed   By: Anner Crete M.D.   On: 09/20/2019 18:38    IMPRESSION/PLAN:  54. 64 year old female with obstructive jaundice. Abdominal MRI/MRCP 09/19/2018 identified biliary and pancreatic duct obstruction with sludge or stones in the CBD and intraductal calculi in the dilated pancreatic duct (some calculi may be as large as 1cm), a pancreatic/ampullary mass which extends into the duodenum with obstruction of the biliary and pancreatic ducts concerning for ampullary vs pancreatic adenocarcinoma and a questionable small lesion to the tail of the pancreas.  T. Bili 21.5. Alk phos 450. AST 236. ALT 138. WBC 11.8 >>11.0. She remains afebrile. At risk for ascending cholangitis.  -ERCP/EUS to be scheduled with Dr.  Rush Landmark Wed 09/22/2019. Benefits and risks discussed including risk with general anesthesia, bleeding, perforation, infection and pancreatitis  -Clear  liquid diet today -NPO after midnight  -IVF per the hospitalist  -Zosyn IV 3.378m Q 8 hours   2. Chronic Hepatitis C GT1a with cirrhosis treated with Harvoni in 2019 with SVR.  -Hep C RNA Quant -Eventual EGD to survey for esophageal varices  -further follow up as outpatient   3. Normocytic Anemia. Hg 11.5 >> 10.8. HCT 33.9 >>31.9. MCV 88.6. No signs of active GI bleeding.  -repeat CBC in am  4. Hyponatremia  5. Hypokalemia  6. DM II  7. History of colon polyps -eventual colonoscopy as an outpatient   8. Alcohol abuse. No alcohol for 2 months.  -continue alcohol abstinence   9. Further recommendations per Dr. MCorena PilgrimMDorathy Daft 09/21/2019, 5:41 AM

## 2019-09-22 ENCOUNTER — Inpatient Hospital Stay (HOSPITAL_COMMUNITY): Payer: BC Managed Care – PPO | Admitting: Certified Registered Nurse Anesthetist

## 2019-09-22 ENCOUNTER — Encounter (HOSPITAL_COMMUNITY): Payer: Self-pay | Admitting: Internal Medicine

## 2019-09-22 ENCOUNTER — Encounter (HOSPITAL_COMMUNITY)
Admission: EM | Disposition: A | Discharge status: Home / Self Care | Payer: Self-pay | Source: Home / Self Care | Attending: Family Medicine

## 2019-09-22 ENCOUNTER — Inpatient Hospital Stay (HOSPITAL_COMMUNITY): Payer: BC Managed Care – PPO

## 2019-09-22 DIAGNOSIS — E876 Hypokalemia: Secondary | ICD-10-CM

## 2019-09-22 DIAGNOSIS — K8689 Other specified diseases of pancreas: Secondary | ICD-10-CM

## 2019-09-22 DIAGNOSIS — E871 Hypo-osmolality and hyponatremia: Secondary | ICD-10-CM

## 2019-09-22 DIAGNOSIS — I8510 Secondary esophageal varices without bleeding: Secondary | ICD-10-CM

## 2019-09-22 DIAGNOSIS — K3189 Other diseases of stomach and duodenum: Secondary | ICD-10-CM

## 2019-09-22 DIAGNOSIS — C250 Malignant neoplasm of head of pancreas: Principal | ICD-10-CM

## 2019-09-22 HISTORY — PX: FINE NEEDLE ASPIRATION: SHX5430

## 2019-09-22 HISTORY — PX: ERCP: SHX5425

## 2019-09-22 HISTORY — PX: BILIARY BRUSHING: SHX6843

## 2019-09-22 HISTORY — PX: SPHINCTEROTOMY: SHX5544

## 2019-09-22 HISTORY — PX: ESOPHAGOGASTRODUODENOSCOPY (EGD) WITH PROPOFOL: SHX5813

## 2019-09-22 HISTORY — PX: BILIARY STENT PLACEMENT: SHX5538

## 2019-09-22 HISTORY — PX: EUS: SHX5427

## 2019-09-22 HISTORY — PX: BILIARY DILATION: SHX6850

## 2019-09-22 LAB — GLUCOSE, CAPILLARY
Glucose-Capillary: 122 mg/dL — ABNORMAL HIGH (ref 70–99)
Glucose-Capillary: 123 mg/dL — ABNORMAL HIGH (ref 70–99)
Glucose-Capillary: 167 mg/dL — ABNORMAL HIGH (ref 70–99)
Glucose-Capillary: 186 mg/dL — ABNORMAL HIGH (ref 70–99)
Glucose-Capillary: 210 mg/dL — ABNORMAL HIGH (ref 70–99)
Glucose-Capillary: 221 mg/dL — ABNORMAL HIGH (ref 70–99)

## 2019-09-22 LAB — COMPREHENSIVE METABOLIC PANEL
ALT: 138 U/L — ABNORMAL HIGH (ref 0–44)
AST: 234 U/L — ABNORMAL HIGH (ref 15–41)
Albumin: 2.3 g/dL — ABNORMAL LOW (ref 3.5–5.0)
Alkaline Phosphatase: 438 U/L — ABNORMAL HIGH (ref 38–126)
Anion gap: 9 (ref 5–15)
BUN: 6 mg/dL — ABNORMAL LOW (ref 8–23)
CO2: 23 mmol/L (ref 22–32)
Calcium: 8.6 mg/dL — ABNORMAL LOW (ref 8.9–10.3)
Chloride: 101 mmol/L (ref 98–111)
Creatinine, Ser: 0.38 mg/dL — ABNORMAL LOW (ref 0.44–1.00)
GFR calc Af Amer: >60 mL/min (ref >60)
GFR calc non Af Amer: >60 mL/min (ref >60)
Glucose, Bld: 134 mg/dL — ABNORMAL HIGH (ref 70–99)
Potassium: 3.3 mmol/L — ABNORMAL LOW (ref 3.5–5.1)
Sodium: 133 mmol/L — ABNORMAL LOW (ref 135–145)
Total Bilirubin: 21.2 mg/dL (ref 0.3–1.2)
Total Protein: 6.8 g/dL (ref 6.5–8.1)

## 2019-09-22 LAB — CBC
HCT: 31.6 % — ABNORMAL LOW (ref 36.0–46.0)
Hemoglobin: 10.4 g/dL — ABNORMAL LOW (ref 12.0–15.0)
MCH: 29.6 pg (ref 26.0–34.0)
MCHC: 32.9 g/dL (ref 30.0–36.0)
MCV: 90 fL (ref 80.0–100.0)
Platelets: 383 10*3/uL (ref 150–400)
RBC: 3.51 MIL/uL — ABNORMAL LOW (ref 3.87–5.11)
RDW: 17.3 % — ABNORMAL HIGH (ref 11.5–15.5)
WBC: 9.9 10*3/uL (ref 4.0–10.5)
nRBC: 0 % (ref 0.0–0.2)

## 2019-09-22 LAB — HIV ANTIBODY (ROUTINE TESTING W REFLEX): HIV Screen 4th Generation wRfx: NONREACTIVE — AB

## 2019-09-22 LAB — CANCER ANTIGEN 19-9: CA 19-9: <2 U/mL (ref 0–35)

## 2019-09-22 LAB — MAGNESIUM: Magnesium: 1.8 mg/dL (ref 1.7–2.4)

## 2019-09-22 IMAGING — RF DG ERCP WO/W SPHINCTEROTOMY
1 series · 15 of 16 positions shown · non-contrast
Comparison: None.

CLINICAL DATA: 63-year-old female with a history of jaundice

EXAM:
ERCP
TECHNIQUE: Multiple spot images obtained with the fluoroscopic device and
submitted for interpretation post-procedure.
FLUOROSCOPY TIME:  Fluoroscopy Time:  4 minutes 24 seconds

[Series 1: run · 15 of 16 slices shown]
[im 1/16]
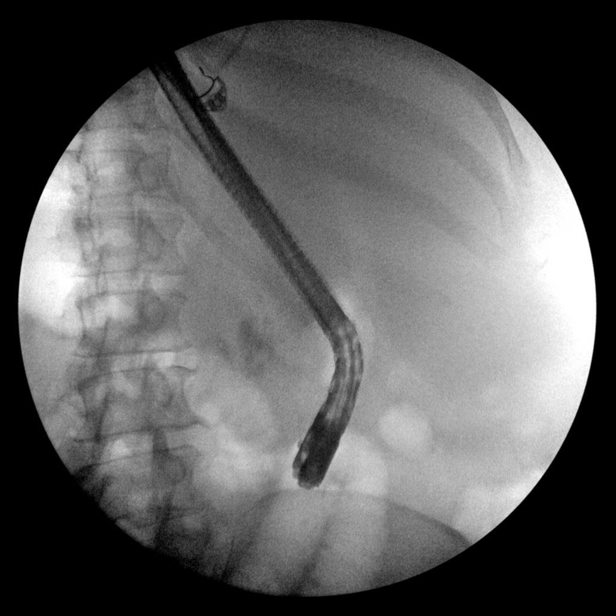
[im 2/16]
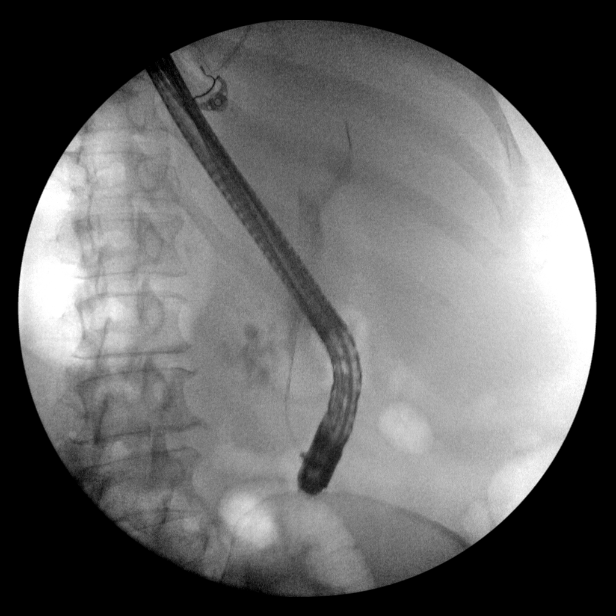
[im 3/16]
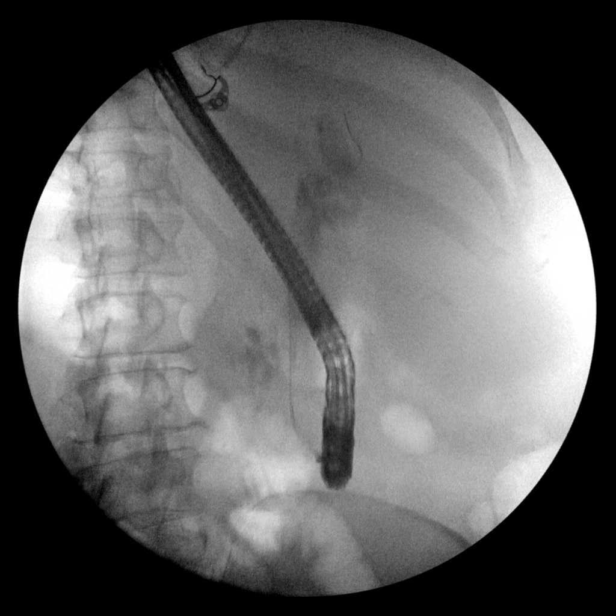
[im 4/16]
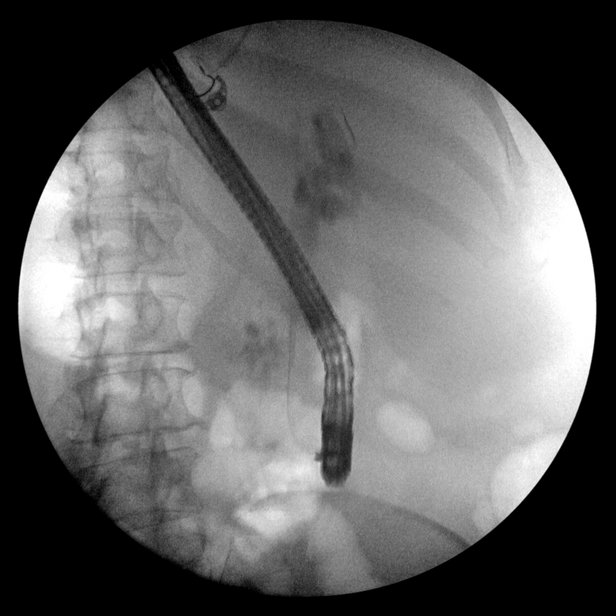
[im 5/16]
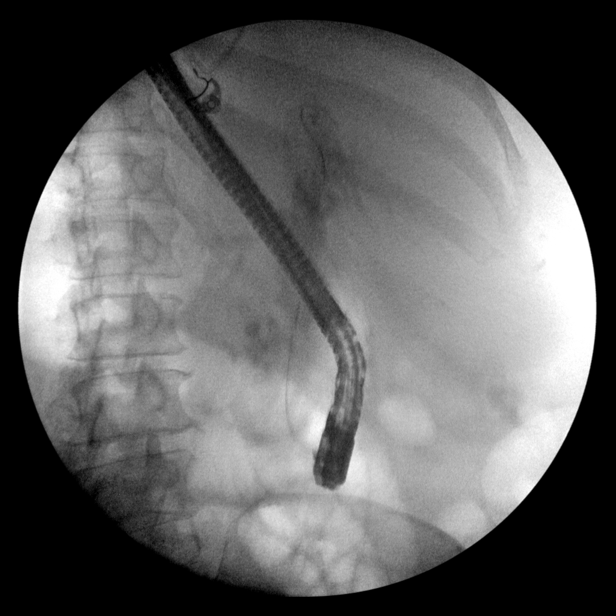
[im 6/16]
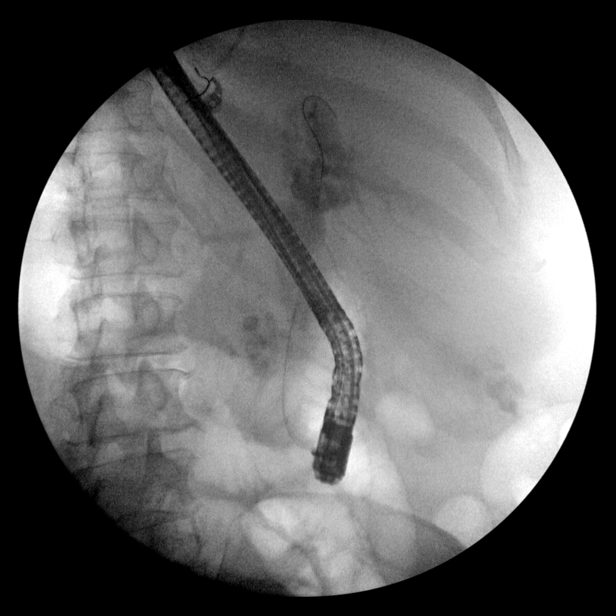
[im 7/16]
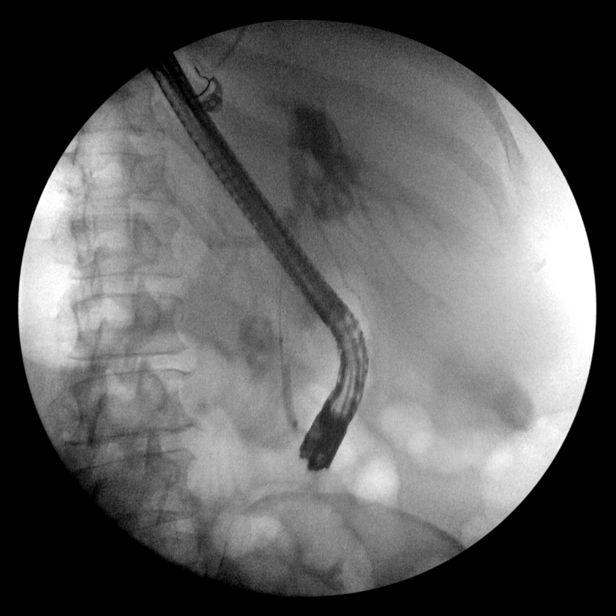
[im 9/16]
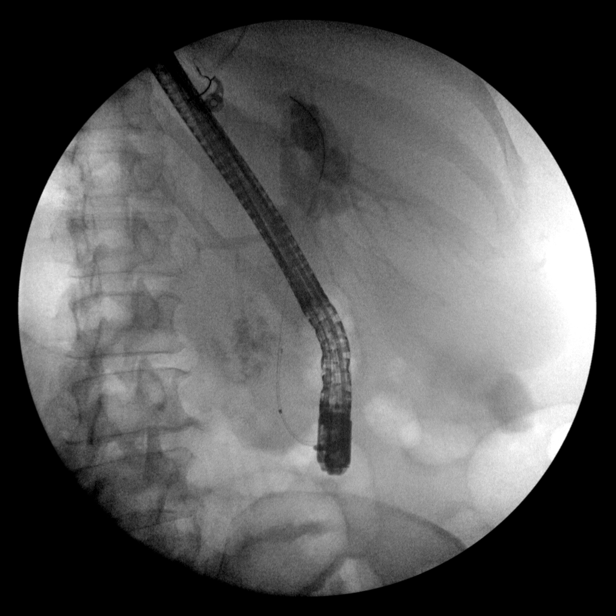
[im 10/16]
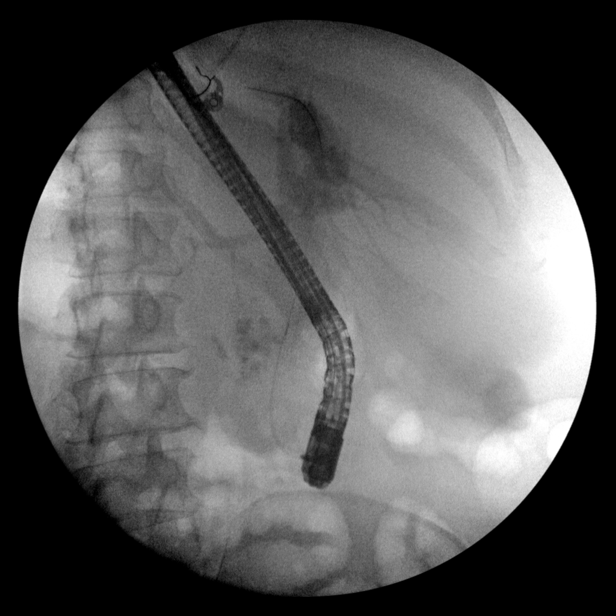
[im 11/16]
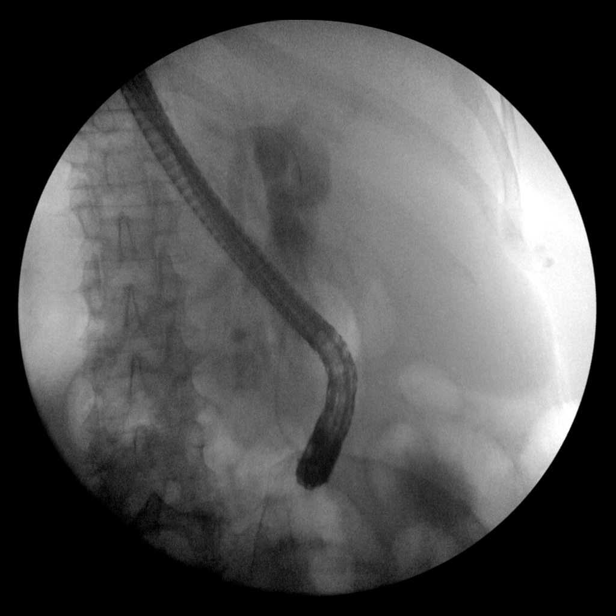
[im 12/16]
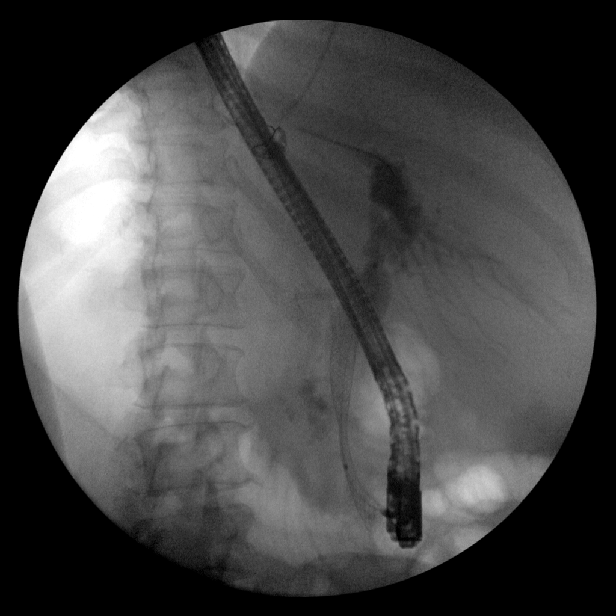
[im 13/16]
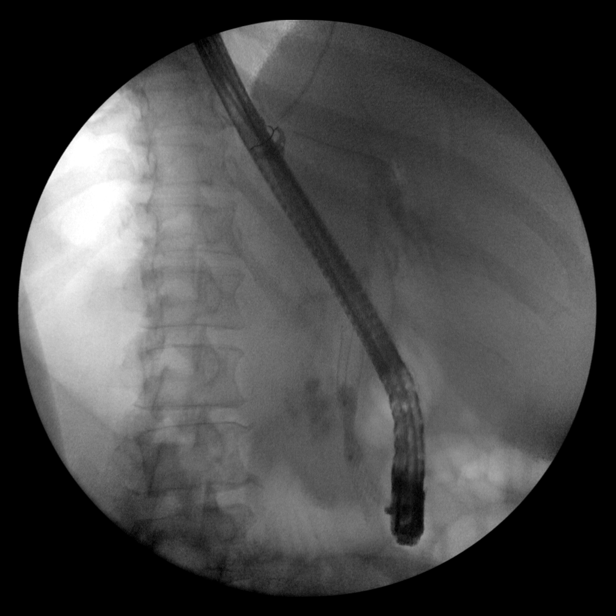
[im 14/16]
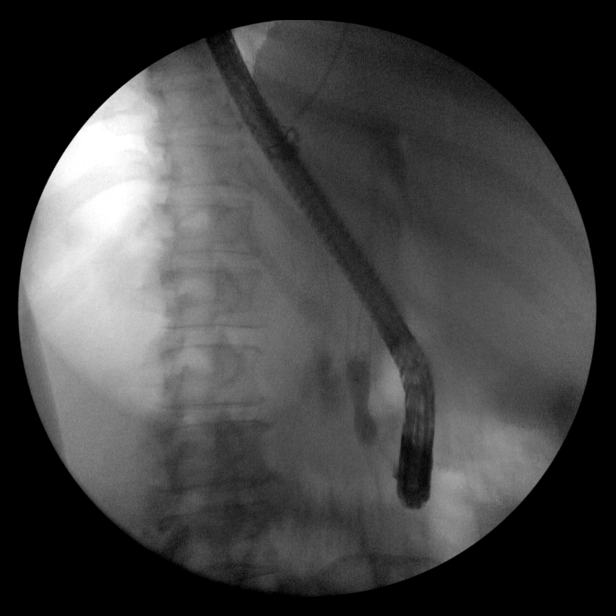
[im 15/16]
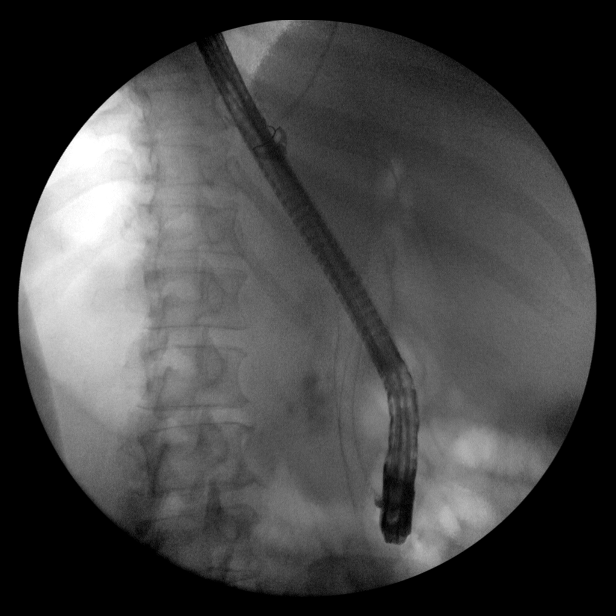
[im 16/16]
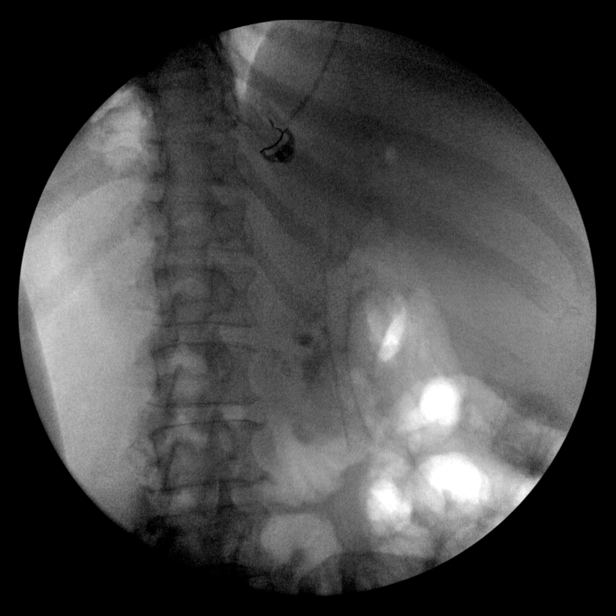

[15 of 16 positions shown; findings below may reference images not displayed]

FINDINGS: Limited intraoperative fluoroscopic spot images during ERCP.

Initial image demonstrates endoscope projecting over the upper
abdomen. Vague calcifications/densities of the upper abdomen may
reflect calcifications of the pancreatic head.

Subsequently there is cannulation of the ampulla and retrograde
infusion of contrast with partial opacification of the extrahepatic
biliary ductal system.

There is then placement of a metallic biliary stent and PTA biliary
stricture.
IMPRESSION: Limited images of ERCP demonstrates treatment of biliary stricture
with stenting and PTA. Please refer to the dictated operative report
for full details of intraoperative findings and procedure.

## 2019-09-22 SURGERY — ERCP, WITH INTERVENTION IF INDICATED
Anesthesia: General

## 2019-09-22 MED ORDER — FENTANYL CITRATE (PF) 100 MCG/2ML IJ SOLN
INTRAMUSCULAR | Status: DC | PRN
  Administered 2019-09-22 (×2): 50 ug via INTRAVENOUS

## 2019-09-22 MED ORDER — DEXAMETHASONE SODIUM PHOSPHATE 10 MG/ML IJ SOLN
INTRAMUSCULAR | Status: DC | PRN
  Administered 2019-09-22: 4 mg via INTRAVENOUS

## 2019-09-22 MED ORDER — TRAMADOL HCL 50 MG PO TABS
50.0000 mg | ORAL_TABLET | Freq: Four times a day (QID) | ORAL | Status: DC | PRN
  Administered 2019-09-23 – 2019-09-26 (×3): 50 mg via ORAL
  Filled 2019-09-22 (×5): qty 1

## 2019-09-22 MED ORDER — SODIUM CHLORIDE 0.9 % IV SOLN: INTRAVENOUS | Status: DC | PRN

## 2019-09-22 MED ORDER — LIDOCAINE 2% (20 MG/ML) 5 ML SYRINGE
INTRAMUSCULAR | Status: DC | PRN
  Administered 2019-09-22: 80 mg via INTRAVENOUS

## 2019-09-22 MED ORDER — GLUCAGON HCL RDNA (DIAGNOSTIC) 1 MG IJ SOLR
INTRAMUSCULAR | Status: DC | PRN
  Administered 2019-09-22 (×2): .25 mg via INTRAVENOUS

## 2019-09-22 MED ORDER — SUGAMMADEX SODIUM 200 MG/2ML IV SOLN
INTRAVENOUS | Status: DC | PRN
  Administered 2019-09-22: 200 mg via INTRAVENOUS

## 2019-09-22 MED ORDER — INDOMETHACIN 50 MG RE SUPP: 100.0000 mg | Freq: Once | RECTAL | Status: DC

## 2019-09-22 MED ORDER — PROPOFOL 10 MG/ML IV BOLUS
INTRAVENOUS | Status: DC | PRN
  Administered 2019-09-22: 140 mg via INTRAVENOUS

## 2019-09-22 MED ORDER — MIDAZOLAM HCL 2 MG/2ML IJ SOLN
INTRAMUSCULAR | Status: AC
  Filled 2019-09-22: qty 2

## 2019-09-22 MED ORDER — SODIUM CHLORIDE 0.9 % IV SOLN: INTRAVENOUS | Status: DC

## 2019-09-22 MED ORDER — FENTANYL CITRATE (PF) 100 MCG/2ML IJ SOLN
INTRAMUSCULAR | Status: AC
  Filled 2019-09-22: qty 2

## 2019-09-22 MED ORDER — ONDANSETRON HCL 4 MG/2ML IJ SOLN
INTRAMUSCULAR | Status: DC | PRN
  Administered 2019-09-22: 4 mg via INTRAVENOUS

## 2019-09-22 MED ORDER — INDOMETHACIN 50 MG RE SUPP
RECTAL | Status: AC
  Filled 2019-09-22: qty 2

## 2019-09-22 MED ORDER — PROPOFOL 10 MG/ML IV BOLUS
INTRAVENOUS | Status: AC
  Filled 2019-09-22: qty 20

## 2019-09-22 MED ORDER — MIDAZOLAM HCL 5 MG/5ML IJ SOLN
INTRAMUSCULAR | Status: DC | PRN
  Administered 2019-09-22: 2 mg via INTRAVENOUS

## 2019-09-22 MED ORDER — INDOMETHACIN 50 MG RE SUPP
RECTAL | Status: DC | PRN
  Administered 2019-09-22: 100 mg via RECTAL

## 2019-09-22 MED ORDER — ROCURONIUM BROMIDE 50 MG/5ML IV SOSY
PREFILLED_SYRINGE | INTRAVENOUS | Status: DC | PRN
  Administered 2019-09-22: 50 mg via INTRAVENOUS

## 2019-09-22 MED ORDER — POTASSIUM CHLORIDE CRYS ER 20 MEQ PO TBCR
40.0000 meq | EXTENDED_RELEASE_TABLET | Freq: Once | ORAL | Status: AC
  Administered 2019-09-22: 40 meq via ORAL
  Filled 2019-09-22: qty 2

## 2019-09-22 MED ORDER — PHENYLEPHRINE 40 MCG/ML (10ML) SYRINGE FOR IV PUSH (FOR BLOOD PRESSURE SUPPORT)
PREFILLED_SYRINGE | INTRAVENOUS | Status: DC | PRN
  Administered 2019-09-22 (×3): 80 ug via INTRAVENOUS

## 2019-09-22 MED ORDER — GLUCAGON HCL RDNA (DIAGNOSTIC) 1 MG IJ SOLR
INTRAMUSCULAR | Status: AC
  Filled 2019-09-22: qty 1

## 2019-09-22 NOTE — Progress Notes (Signed)
PROGRESS NOTE    Melanie Alvarez  [id number] DOB: 12-Jan-1956 DOA: 09/20/2019 PCP: Flossie Buffy, NP    Brief Narrative: Melanie Alvarez is a 64 y.o. female with significant medical history of diabetes mellitus type 2, hypertension, obesity, hepatitis C status post treatment, cirrhosis from her alcohol abuse. Patient presented secondary to abnormal liver labs with concern for malignant pancreatic mass. GI consulted and EUS/ERCP performed on 2/3.   Assessment & Plan:   Principal Problem:   Abnormal liver function Active Problems:   DM (diabetes mellitus) (HCC)   Hypokalemia   Hyponatremia   Pancreatic mass   Elevated AST/ALT/Alkaline phosphatase Elevated bilirubin Pancreatic mass Concern for malignancy. GI consulted and ERCP/EUS performed 2/3.  -GI recommendations: pathology pending. Stent placed. -Daily CMP  Chronic hepatitis C History of cirrhosis. S/p treatment with Harvoni.  Hypokalemia Treated with supplementation  UTI Mentioned in prior notes. Urinalysis does not suggest UTI. No urine culture obtained. -Discontinue Zosyn  Hyponatremia Mild. Improvement with IV fluids  Normocytic anemia Unknown etiology but possibly related to malignancy. Stable.  Essential hypertension Not well controlled. Patient is on metoprolol, amlodipine as an outpatient -Continue amlodipine and metoprolol  Diabetes mellitus, type 2 Patient is on metformin as an outpatient -Continue SSI  Obesity Body mass index is 38 kg/m.  DVT prophylaxis: SCDs Code Status:   Code Status: Full Code Family Communication: Husband at bedside Disposition Plan: Discharge pending GI recommendations   Consultants:   [facility]  Procedures:   ERCP/EUS  Antimicrobials:  Zosyn    Subjective: No reported issues overnight  Objective: Vitals:   09/22/19 1530 09/22/19 1540 09/22/19 1550 09/22/19 1624  BP: (!) 161/73 (!) 156/56 (!) 179/82 135/69  Pulse: 86 82 85 73  Resp: (!)  24 19 18 16   Temp:  98.9 F (37.2 C)  97.9 F (36.6 C)  TempSrc:  Oral  Oral  SpO2: 100% 100% 97% 100%  Weight:      Height:        Intake/Output Summary (Last 24 hours) at 09/22/2019 1727 Last data filed at 09/22/2019 1600 Gross per 24 hour  Intake 1585.27 ml  Output 0 ml  Net 1585.27 ml   Filed Weights   09/21/19 0424 09/22/19 0500 09/22/19 0554  Weight: 94 kg 96.2 kg 97.3 kg    Examination:  General exam: Appears calm and comfortable   Data Reviewed: I have personally reviewed following labs and imaging studies  CBC: Recent Labs  Lab 09/20/19 1124 09/21/19 0509 09/22/19 0539  WBC 11.8* 11.0* 9.9  NEUTROABS 7.8*  --   --   HGB 11.5* 10.8* 10.4*  HCT 33.9* 31.9* 31.6*  MCV 89.1 88.6 90.0  PLT 415.0 Repeated and verified X2.* 397 [id number]   Basic Metabolic Panel: Recent Labs  Lab 09/20/19 1124 09/21/19 0509 09/22/19 0539  NA 129* 132* 133*  K 3.1* 2.9* 3.3*  CL 92* 98 101  CO2 26 24 23   GLUCOSE 111* 125* 134*  BUN 11 8 6*  CREATININE 0.98 <0.30* 0.38*  CALCIUM 9.4 8.6* 8.6*  MG  --   --  1.8   GFR: Estimated Creatinine Clearance: 81 mL/min (A) (by C-G formula based on SCr of 0.38 mg/dL (L)). Liver Function Tests: Recent Labs  Lab 09/20/19 1124 09/21/19 0509 09/22/19 0539  AST 248* 236* 234*  ALT 157* 138* 138*  ALKPHOS 601* 450* 438*  BILITOT 26.6*  23.7* 21.5* 21.2*  PROT 7.4 6.8 6.8  ALBUMIN 3.4* 2.2* 2.3*   No  results for input(s): LIPASE, AMYLASE in the last 168 hours. No results for input(s): AMMONIA in the last 168 hours. Coagulation Profile: Recent Labs  Lab 09/20/19 1124  INR 1.3*   Cardiac Enzymes: Recent Labs  Lab 09/21/19 0509  CKTOTAL 78  CKMB 1.6   BNP (last 3 results) No results for input(s): PROBNP in the last 8760 hours. HbA1C: Recent Labs    09/20/19 1124  HGBA1C 6.7*   CBG: Recent Labs  Lab 09/21/19 2035 09/22/19 0009 09/22/19 0459 09/22/19 0737 09/22/19 1540  GLUCAP 121* 167* 123* 122* 210*   Lipid  Profile: No results for input(s): CHOL, HDL, LDLCALC, TRIG, CHOLHDL, LDLDIRECT in the last 72 hours. Thyroid Function Tests: No results for input(s): TSH, T4TOTAL, FREET4, T3FREE, THYROIDAB in the last 72 hours. Anemia Panel: No results for input(s): VITAMINB12, FOLATE, FERRITIN, TIBC, IRON, RETICCTPCT in the last 72 hours. Sepsis Labs: No results for input(s): PROCALCITON, LATICACIDVEN in the last 168 hours.  Recent Results (from the past 240 hour(s))  SARS CORONAVIRUS 2 (TAT 6-24 HRS) Nasopharyngeal Nasopharyngeal Swab     Status: None   Collection Time: 09/20/19  9:35 PM   Specimen: Nasopharyngeal Swab  Result Value Ref Range Status   SARS Coronavirus 2 NEGATIVE NEGATIVE Final    Comment: (NOTE) SARS-CoV-2 target nucleic acids are NOT DETECTED. The SARS-CoV-2 RNA is generally detectable in upper and lower respiratory specimens during the acute phase of infection. Negative results do not preclude SARS-CoV-2 infection, do not rule out co-infections with other pathogens, and should not be used as the sole basis for treatment or other patient management decisions. Negative results must be combined with clinical observations, patient history, and epidemiological information. The expected result is Negative. Fact Sheet for Patients: SugarRoll.be Fact Sheet for Healthcare Providers: https://www.woods-mathews.com/ This test is not yet approved or cleared by the [place] FDA and  has been authorized for detection and/or diagnosis of SARS-CoV-2 by FDA under an Emergency Use Authorization (EUA). This EUA will remain  in effect (meaning this test can be used) for the duration of the COVID-19 declaration under Section 56 4(b)(1) of the Act, 21 U.S.C. section 360bbb-3(b)(1), unless the authorization is terminated or revoked sooner. Performed at [facility], [place] [address], [place], [place] [address]          Radiology Studies: DG  ERCP BILIARY & PANCREATIC DUCTS  Result Date: 09/22/2019 CLINICAL DATA:  64 year old female with a history of jaundice EXAM: ERCP TECHNIQUE: Multiple spot images obtained with the fluoroscopic device and submitted for interpretation post-procedure. FLUOROSCOPY TIME:  Fluoroscopy Time:  4 minutes 24 seconds COMPARISON:  None. FINDINGS: Limited intraoperative fluoroscopic spot images during ERCP. Initial image demonstrates endoscope projecting over the upper abdomen. Vague calcifications/densities of the upper abdomen may reflect calcifications of the pancreatic head. Subsequently there is cannulation of the ampulla and retrograde infusion of contrast with partial opacification of the extrahepatic biliary ductal system. There is then placement of a metallic biliary stent and PTA biliary stricture. IMPRESSION: Limited images of ERCP demonstrates treatment of biliary stricture with stenting and PTA. Please refer to the dictated operative report for full details of intraoperative findings and procedure. Electronically Signed   By: Corrie Mckusick D.O.   On: 09/22/2019 16:26   MR ABDOMEN MRCP W WO CONTAST  Result Date: 09/20/2019 CLINICAL DATA:  Jaundice. EXAM: MRI ABDOMEN WITHOUT AND WITH CONTRAST (INCLUDING MRCP) TECHNIQUE: Multiplanar multisequence MR imaging of the abdomen was performed both before and after the administration of  intravenous contrast. Heavily T2-weighted images of the biliary and pancreatic ducts were obtained, and three-dimensional MRCP images were rendered by post processing. CONTRAST:  30mL GADAVIST GADOBUTROL 1 MMOL/ML IV SOLN COMPARISON:  Abdominal sonogram of 09/20/2019 FINDINGS: Lower chest: Limited assessment of the lower chest shows no effusion or consolidation. Study limited by patient body habitus overall. 13 mm pre pericardial lymph node is nonspecific. There is also a suggestion of irregularity of soft tissues in the left left supraclavicular region. This could represent nodal disease  given findings below. Hepatobiliary: Signs of irregular filling defect in the duodenum, associated with biliary and pancreatic ductal dilation and pancreatic atrophy. Common bile duct is largest 15 mm with marked distension of the intrahepatic biliary tree. Common bile duct with tapered narrowing to the level of the ampulla. Sludge in the dependent gallbladder and small stones. Also likely with sludge in the common bile duct and dependent low signal intensity on reconstructed MRCP sequences that suggests either small stones or biliary sludge in the dependent portion of the distal common bile duct. No visible intrahepatic calculi. Pancreas: Marked dilation of dorsal pancreatic duct with signs of pancreatic atrophy in intraductal calculi suggested on T2 and MRCP sequences some as large is a cm and mainly present near the ductal confluence. Ampullary filling defect as outlined above. Ductal caliber in maximum axial dimension 14 mm of the pancreatic duct. In addition to extension into the ampulla there is abnormal soft tissue involving the pancreatic head best seen on subtraction images, image 74 of postcontrast data measuring approximately 4.4 x 3.2 cm in greatest axial dimension including extension into the lumen of the duodenum. Spleen:  Spleen is normal size without suspicious lesion. Adrenals/Urinary Tract: Adrenal glands are normal. Small part solid and enhancing renal lesion arises from the lower pole of the right kidney measuring 11 x 9 mm. No signs of hydronephrosis. Stomach/Bowel: No sign of bowel obstruction. Masslike area in the region of the ampulla filling the ampullary portion of the duodenum may measure as large as 3.5 x 3.1 cm in greatest axial dimension and results in ductal obstruction of both pancreatic and biliary duct. Lobular contour of the pancreatic tail with heterogeneity, question of small lesion in this location as well 1.3 cm (image 49, venous phase data set, postcontrast images.  Vascular/Lymphatic: Bulky periportal lymph nodes suspicious for neoplastic/metastatic lymph nodes based on location in the appearance of the duodenum. Also with enlarged hepatic gastric lymph nodes. (Image 59, post-contrast images) 1.4 cm portacaval lymph node (Image 46, post-contrast images 9 mm hepatic gastric lymph node. Other: Portal vein and SMV are patent. SMA is patent. No signs of soft tissue in case mint of vascular structures in the upper abdomen. Musculoskeletal: No suspicious bone lesions identified. IMPRESSION: 1. Signs of pancreatic/ampullary mass with extension into duodenum and obstruction of biliary and pancreatic ducts. 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 Endoscopic assessment is suggested. 2. Signs of pancreatic atrophy with lobular area in the pancreatic tail the raise the question of second small lesion versus is heterogeneous pancreas with signs of prior inflammation. 3. Biliary and pancreatic ductal obstruction with sludge and/or small stones layering dependently in the common bile duct and intraductal calculi suspected in the dilated pancreatic duct near the biliary ductal and pancreatic ductal confluence. Some calculi may be as large as 1 cm. 4. Signs of nodal enlargement in the upper abdomen and even in lower chest raising the question of nodal involvement. 5. Query left supraclavicular nodal enlargement, chest  imaging may be helpful given findings above. 6. Small solid and enhancing lesion in the lower pole of the right kidney. Recommend attention on follow-up imaging. This recommendation follows ACR consensus guidelines: White Paper of the ACR Incidental Findings Committee II on Vascular Findings. J Am Coll Radiol 2013; 10:789-794. 7. These results will be called to the ordering clinician or representative by the Radiologist Assistant, and communication documented in the PACS or zVision Dashboard. Electronically Signed   By: Zetta Bills  M.D.   On: 09/20/2019 22:10   US Abdomen Limited RUQ  Result Date: 09/20/2019 CLINICAL DATA:  64 year old female with elevated bilirubin. EXAM: ULTRASOUND ABDOMEN LIMITED RIGHT UPPER QUADRANT COMPARISON:  Ultrasound dated 08/19/2019. FINDINGS: Gallbladder: There is sludge within the gallbladder. No definite shadowing stone, gallbladder wall thickening, or pericholecystic fluid. Negative sonographic Murphy's sign. Common bile duct: Diameter: 16 mm. Interval increase in the size of the common bile duct as well as development of mild intrahepatic biliary ductal dilatation since the prior ultrasound. Correlation with clinical exam, and LFTs recommended. MRCP may provide better evaluation. Liver: There is heterogeneously increased liver echogenicity most commonly seen in the setting of fatty infiltration. Superimposed inflammation or fibrosis is not excluded. Clinical correlation is recommended. Portal vein is patent on color Doppler imaging with normal direction of blood flow towards the liver. Other: None. IMPRESSION: 1. Moderate amount of gallbladder sludge. No sonographic findings of acute cholecystitis. 2. Dilatation of the common bile duct, progressed since the prior ultrasound with interval development of mild intrahepatic biliary ductal dilatation. Clinical correlation is recommended. MRCP may provide better evaluation. 3. Probable fatty liver or changes of cirrhosis. Electronically Signed   By: Anner Crete M.D.   On: 09/20/2019 18:38        Scheduled Meds: . amLODipine  10 mg Oral QHS  . feeding supplement  1 Container Oral TID BM  . indomethacin  100 mg Rectal Once  . insulin aspart  0-9 Units Subcutaneous Q4H  . irbesartan  37.5 mg Oral Daily  . metoprolol succinate  25 mg Oral Daily   Continuous Infusions: . piperacillin-tazobactam (ZOSYN)  IV 3.375 g (09/22/19 0529)     LOS: 2 days     Cordelia Poche, MD [facility] 09/22/2019, 5:27 PM  If 7PM-7AM, please contact  night-coverage [email or web address]

## 2019-09-22 NOTE — Anesthesia Postprocedure Evaluation (Signed)
Anesthesia Post Note  Patient: Melanie Alvarez  Procedure(s) Performed: ENDOSCOPIC RETROGRADE CHOLANGIOPANCREATOGRAPHY (ERCP) (N/A ) UPPER ENDOSCOPIC ULTRASOUND (EUS) RADIAL (N/A ) SPHINCTEROTOMY BILIARY DILATION FINE NEEDLE ASPIRATION (FNA) LINEAR BILIARY STENT PLACEMENT (N/A ) BILIARY BRUSHING     Patient location during evaluation: Endoscopy Anesthesia Type: General Level of consciousness: awake and alert, patient cooperative and oriented Pain management: pain level controlled Vital Signs Assessment: post-procedure vital signs reviewed and stable Respiratory status: spontaneous breathing, nonlabored ventilation and respiratory function stable Cardiovascular status: blood pressure returned to baseline and stable Postop Assessment: no apparent nausea or vomiting Anesthetic complications: no    Last Vitals:  Vitals:   09/22/19 1550 09/22/19 1624  BP: (!) 179/82 135/69  Pulse: 85 73  Resp: 18 16  Temp:  36.6 C  SpO2: 97% 100%    Last Pain:  Vitals:   09/22/19 1624  TempSrc: Oral  PainSc:                  Melanie Alvarez,E. Pernella Ackerley

## 2019-09-22 NOTE — Progress Notes (Signed)
.   Pt pain 5/10 abdomen. No PRN ordered. On call paged.

## 2019-09-22 NOTE — Anesthesia Preprocedure Evaluation (Addendum)
Anesthesia Evaluation  Patient identified by MRN, date of birth, ID band Patient awake    Reviewed: Allergy & Precautions, H&P , NPO status , Patient's Chart, lab work & pertinent test results, reviewed documented beta blocker date and time   Airway Mallampati: I  TM Distance: >3 FB Neck ROM: full    Dental no notable dental hx. (+) Teeth Intact, Dental Advisory Given, Poor Dentition, Chipped, Missing   Pulmonary Current Smoker and Patient abstained from smoking., former smoker,    Pulmonary exam normal breath sounds clear to auscultation       Cardiovascular Exercise Tolerance: Good hypertension, Pt. on medications and Pt. on home beta blockers negative cardio ROS   Rhythm:regular Rate:Normal     Neuro/Psych negative neurological ROS  negative psych ROS   GI/Hepatic negative GI ROS, (+) Cirrhosis     substance abuse  alcohol use, Hepatitis -, C  Endo/Other  diabetes, Oral Hypoglycemic AgentsMorbid obesity  Renal/GU negative Renal ROS  negative genitourinary   Musculoskeletal  (+) Arthritis , Osteoarthritis,    Abdominal   Peds  Hematology  (+) Blood dyscrasia, anemia ,   Anesthesia Other Findings   Reproductive/Obstetrics negative OB ROS                            Anesthesia Physical Anesthesia Plan  ASA: III  Anesthesia Plan: General   Post-op Pain Management:    Induction: Intravenous  PONV Risk Score and Plan: 3 and Ondansetron, Dexamethasone, Treatment may vary due to age or medical condition and Midazolam  Airway Management Planned: Oral ETT  Additional Equipment:   Intra-op Plan:   Post-operative Plan: Extubation in OR  Informed Consent: I have reviewed the patients History and Physical, chart, labs and discussed the procedure including the risks, benefits and alternatives for the proposed anesthesia with the patient or authorized representative who has indicated  his/her understanding and acceptance.     Dental Advisory Given  Plan Discussed with: CRNA and Anesthesiologist  Anesthesia Plan Comments: (  )       Anesthesia Quick Evaluation

## 2019-09-22 NOTE — Op Note (Signed)
[facility] Patient Name: Melanie Alvarez Procedure Date: 09/22/2019 MRN: [id number] Attending MD: Justice Britain , MD Date of Birth: 1955/08/25 CSN: [id number] Age: 64 Admit Type: Inpatient Procedure:                ERCP Indications:              Malignant stricture of the common bile duct,                            Abnormal MRCP, Jaundice, Elevated liver enzymes,                            Malignant tumor of the head of pancreas Providers:                Justice Britain, MD, Cleda Daub, RN, Elmer Ramp.                            Tilden Dome, RN, Elspeth Cho Tech., Technician, Corie Chiquito, Technician, Adair Laundry, CRNA Referring MD:             [facility] Medicines:                General Anesthesia, Indomethacin 100 mg PR,                            Glucagon 0.75 mg IV, Patient already on antibiotics                            in hospital so they were not given during procedure Complications:            No immediate complications. Estimated Blood Loss:     Estimated blood loss was minimal. Procedure:                Pre-Anesthesia Assessment:                           - Prior to the procedure, a History and Physical                            was performed, and patient medications and                            allergies were reviewed. The patient's tolerance of                            previous anesthesia was also reviewed. The risks                            and benefits of the procedure and the sedation                            options and risks were discussed with the patient.  All questions were answered, and informed consent                            was obtained. Prior Anticoagulants: The patient has                            taken no previous anticoagulant or antiplatelet                            agents. ASA Grade Assessment: III - A patient with                            severe  systemic disease. After reviewing the risks                            and benefits, the patient was deemed in                            satisfactory condition to undergo the procedure.                           After obtaining informed consent, the scope was                            passed under direct vision. Throughout the                            procedure, the patient's blood pressure, pulse, and                            oxygen saturations were monitored continuously. The                            TJF-Q190V [id number] Olympus duodenoscope was                            introduced through the mouth, and used to inject                            contrast into and used to inject contrast into the                            bile duct. The ERCP was accomplished without                            difficulty. The patient tolerated the procedure. Scope In: Scope Out: Findings:      The scout film was normal.      The esophagus was successfully intubated under direct vision without       detailed examination of the pharynx, larynx, and associated structures,       and upper GI tract. The major papilla was bulging and congested as a       result of the submucosal protrusion      A short 0.025 inch Revolution Jagwire  was passed into the biliary tree.       The Jagtome sphincterotome was passed over the guidewire and the bile       duct was then deeply cannulated. Contrast was injected. I personally       interpreted the bile duct images. Ductal flow of contrast was adequate.       Image quality was adequate. Contrast extended to the hepatic ducts.       Opacification of the entire biliary tree except for the cystic duct and       gallbladder was successful. The lower third of the main duct contained a       single severe stenosis 35 mm in length. The middle third of the main       bile duct, upper third of the main bile duct and left and right hepatic       ducts and all intrahepatic  branches were severely dilated, secondary to       aforementioned stricture. The largest diameter was 16 mm. A 6 mm biliary       sphincterotomy was made with a monofilament Jagtome sphincterotome using       ERBE electrocautery. There was no post-sphincterotomy bleeding. Dilation       of the stricture to improve passage of tools was performed with a       Hurricane 4 mm balloon dilator. Cells for cytology were obtained by       brushing in the lower third of the main bile duct stricture. One [place]       10 mm by 6 cm uncovered metal biliary stent was placed into the common       bile duct. Bile flowed through the stent. The stent was in good       position. The stent however did not look to have complete expansion and       the stent catheter was difficult to remove. To improve drainage and       ensure stent expansion, dilation of the CBD stricture with an 03-27-09 mm       balloon (to a maximum balloon size of 10 mm) dilator was successful       after 3 minutes, the stent had further expanded.      A pancreatogram was not performed.      The duodenoscope was withdrawn from the patient. Impression:               - The major papilla appeared to be bulging and                            congested as a result of the submucosal protrusion                            of the mass.                           - A single severe biliary stricture was found in                            the lower third of the main bile duct. The                            stricture was malignant  appearing.                           - The upper third of the main bile duct, middle                            third of the main bile duct and left and right                            hepatic ducts and all intrahepatic branches were                            severely dilated, secondary to the stricture.                           - A biliary sphincterotomy was performed.                           - Cells for cytology  obtained in the lower third of                            the main duct stricture.                           - One uncovered metal biliary stent was placed into                            the common bile duct. This was dilated to ensure                            stent expansion. Moderate Sedation:      Not Applicable - Patient had care per Anesthesia. Recommendation:           - The patient will be observed post-procedure,                            until all discharge criteria are met.                           - Return patient to hospital ward for ongoing care.                           - Check liver enzymes (AST, ALT, alkaline                            phosphatase, bilirubin) in the morning as well as a                            CA19-9.                           - Observe patient's clinical course.                           - Await cytology results.                           -  Watch for pancreatitis, bleeding, perforation,                            and cholangitis.                           - Would consider CT-Chest to complete staging for                            patient.                           - Oncology referral as inpatient vs outpatient                            based on patient status in the next 24-48 hours                            should be obtained.                           - Hold Chemical VTE PPx for 48 hours to decrease                            risk of post-sphincterotomy bleeding if possible -                            SCDs, TEDs OK as well as UOB.                           - The findings and recommendations were discussed                            with the patient.                           - The findings and recommendations were discussed                            with the patient's family.                           - The findings and recommendations were discussed                            with the referring physician. Procedure Code(s):        ---  Professional ---                           [phone], Endoscopic retrograde                            cholangiopancreatography (ERCP); with placement of                            endoscopic stent into biliary or pancreatic duct,  including pre- and post-dilation and guide wire                            passage, when performed, including sphincterotomy,                            when performed, each stent Diagnosis Code(s):        --- Professional ---                           K83.1, Obstruction of bile duct                           R17, Unspecified jaundice                           R74.8, Abnormal levels of other serum enzymes                           C25.0, Malignant neoplasm of head of pancreas                           K83.8, Other specified diseases of biliary tract                           R93.2, Abnormal findings on diagnostic imaging of                            liver and biliary tract CPT copyright 2019 American [employer]. All rights reserved. The codes documented in this report are preliminary and upon coder review may  be revised to meet current compliance requirements. Justice Britain, MD 09/22/2019 3:47:37 PM Number of Addenda: 0

## 2019-09-22 NOTE — Transfer of Care (Signed)
Immediate Anesthesia Transfer of Care Note  Patient: Melanie Alvarez  Procedure(s) Performed: ENDOSCOPIC RETROGRADE CHOLANGIOPANCREATOGRAPHY (ERCP) (N/A ) UPPER ENDOSCOPIC ULTRASOUND (EUS) RADIAL (N/A ) SPHINCTEROTOMY BILIARY DILATION FINE NEEDLE ASPIRATION (FNA) LINEAR BILIARY STENT PLACEMENT (N/A ) BILIARY BRUSHING  Patient Location: Endoscopy Unit  Anesthesia Type:General  Level of Consciousness: drowsy and patient cooperative  Airway & Oxygen Therapy: Patient Spontanous Breathing and Patient connected to face mask oxygen  Post-op Assessment: Report given to RN and Post -op Vital signs reviewed and stable  Post vital signs: Reviewed and stable  Last Vitals:  Vitals Value Taken Time  BP    Temp    Pulse 92 09/22/19 1520  Resp 21 09/22/19 1520  SpO2 100 % 09/22/19 1520  Vitals shown include unvalidated device data.  Last Pain:  Vitals:   09/22/19 1141  TempSrc: Oral  PainSc: 0-No pain         Complications: No apparent anesthesia complications

## 2019-09-22 NOTE — Interval H&P Note (Signed)
History and Physical Interval Note:  09/22/2019 12:07 PM  Melanie Alvarez  has presented today for surgery, with the diagnosis of pancreatic mass, biliary and pancreatic duct obstruction.  The various methods of treatment have been discussed with the patient and family. After consideration of risks, benefits and other options for treatment, the patient has consented to  Procedure(s): ENDOSCOPIC RETROGRADE CHOLANGIOPANCREATOGRAPHY (ERCP) (N/A) UPPER ENDOSCOPIC ULTRASOUND (EUS) RADIAL (N/A) as a surgical intervention.  The patient's history has been reviewed, patient examined, no change in status, stable for surgery.  I have reviewed the patient's chart and labs.  Questions were answered to the patient's satisfaction.    The risks of EUS including bleeding, infection, aspiration pneumonia and intestinal perforation were discussed as was the possibility it may not give a definitive diagnosis.  If a biopsy of the pancreas is done as part of the EUS, there is an additional risk of pancreatitis at the rate of about 1%.  It was explained that procedure related pancreatitis is typically mild, although can be severe and even life threatening, which is why we do not perform random pancreatic biopsies and only biopsy a lesion we feel is concerning enough to warrant the risk.  The risks of an ERCP were discussed at length, including but not limited to the risk of perforation, bleeding, abdominal pain, post-ERCP pancreatitis (while usually mild can be severe and even life threatening).    [employer]

## 2019-09-22 NOTE — Anesthesia Procedure Notes (Signed)
Procedure Name: Intubation Date/Time: 09/22/2019 12:17 PM Performed by: Montel Clock, CRNA Pre-anesthesia Checklist: Patient identified, Emergency Drugs available, Suction available, Patient being monitored and Timeout performed Patient Re-evaluated:Patient Re-evaluated prior to induction Oxygen Delivery Method: Circle system utilized Preoxygenation: Pre-oxygenation with 100% oxygen Induction Type: IV induction Ventilation: Mask ventilation without difficulty Laryngoscope Size: Mac and 3 Grade View: Grade I Tube type: Oral Tube size: 7.0 mm Number of attempts: 1 Airway Equipment and Method: Stylet Placement Confirmation: ETT inserted through vocal cords under direct vision,  positive ETCO2 and breath sounds checked- equal and bilateral Secured at: 21 cm Tube secured with: Tape Dental Injury: Teeth and Oropharynx as per pre-operative assessment

## 2019-09-22 NOTE — Op Note (Signed)
[facility] Patient Name: Melanie Alvarez Procedure Date: 09/22/2019 MRN: [id number] Attending MD: Justice Britain , MD Date of Birth: [birth year] CSN: [id number] Age: 64 Admit Type: Inpatient Procedure:                Upper EUS Indications:              Common bile duct dilation (acquired) seen on MRCP,                            Dilated pancreatic duct on MRCP, Suspected mass in                            pancreas on MRCP, Abnormal liver function test Providers:                Justice Britain, MD Referring MD:             [facility] Medicines:                General Anesthesia Complications:            No immediate complications. Estimated Blood Loss:     Estimated blood loss was minimal. Procedure:                Pre-Anesthesia Assessment:                           - Prior to the procedure, a History and Physical                            was performed, and patient medications and                            allergies were reviewed. The patient's tolerance of                            previous anesthesia was also reviewed. The risks                            and benefits of the procedure and the sedation                            options and risks were discussed with the patient.                            All questions were answered, and informed consent                            was obtained. Prior Anticoagulants: The patient has                            taken no previous anticoagulant or antiplatelet                            agents. ASA Grade Assessment: III - A patient with  severe systemic disease. After reviewing the risks                            and benefits, the patient was deemed in                            satisfactory condition to undergo the procedure.                           After obtaining informed consent, the endoscope was                            passed under direct vision. Throughout the                        procedure, the patient's blood pressure, pulse, and                            oxygen saturations were monitored continuously. The                            [id number]-H190 [id number] Olympus gastroscope was                            introduced through the mouth, and advanced to the                            second part of duodenum. The TJF-Q190V [id number])                            Olympus duodenoscope was introduced through the                            mouth, and advanced to the second part of duodenum.                            The [id number] Olympus Linear EUS was                            introduced through the mouth, and advanced to the                            duodenum for ultrasound examination from the                            stomach and duodenum. The upper EUS was                            accomplished without difficulty. The patient                            tolerated the procedure. Scope In: Scope Out: Findings:      ENDOSCOPIC FINDING: :      No gross lesions were noted in the proximal esophagus and in  the mid       esophagus.      Grade I varices were found in the distal esophagus.      The Z-line was irregular and was found 38 cm from the incisors.      Multiple dispersed small erosions with no bleeding and no stigmata of       recent bleeding were found in the gastric antrum.      No other gross lesions were noted in the entire examined stomach.       Biopsies were taken with a cold forceps for histology and Helicobacter       pylori testing.      A large submucosal mass with stigmata of recent bleeding was found at       the major papilla. This caused distortion of the ampulla. Biopsies were       taken with a cold forceps for histology purposes - though likely will be       non-diagnostic as they were only superficial and this did not have       appearance of typical invasive mucosal disease.      ENDOSONOGRAPHIC FINDING: :      A  rounded mass was identified in the pancreatic head. The mass was       hypoechoic. The mass measured 30 mm by 25 mm in maximal cross-sectional       diameter on EUS. The endosonographic borders were well-defined. An       intact interface was seen between the mass and the superior mesenteric       artery, celiac trunk and superior mesenteric vein suggesting a lack of       invasion. The remainder of the pancreas was examined. The       endosonographic appearance of parenchyma and the upstream pancreatic       duct indicated parenchymal atrophy. Fine needle biopsy was performed.       Color Doppler imaging was utilized prior to needle puncture to confirm a       lack of significant vascular structures within the needle path. Four       passes were made with the Acquire 22 gauge ultrasound core biopsy needle       using a transduodenal approach. A visible core of tissue was obtained.       Preliminary cytologic examination and touch preps were performed. Final       cytology results are pending.      The pancreatic duct had intraductal stones in the pancreatic head and       genu of the pancreas. The pancreatic duct in the head measured between       6.1 mm -> 10.8 mm. This is a resultant of the mass as well as the       pancreatic duct stones.      The pancreatic duct in the body measured 6.3 mm -> 2.0 mm.       Interestingly, in the pancreatic tail, there became a relative dilation       from 1.6 -> 5.7 mm. Multiple views were performed and no       mass/stricture/stone was noted in the region. Somewhat atypical.      Moderate hyperechoic material consistent with sludge was visualized       endosonographically in the gallbladder.      There was dilation in the common bile duct up to 7.8 mm and in the  common hepatic duct up to 18.2 mm.      A few malignant-appearing lymph nodes were visualized in the       gastrohepatic ligament (level 18) and porta hepatis region with the        ultrasound probe located 38 cm from the incisors. The largest measured       19 mm by 8 mm in maximal cross-sectional diameter. The nodes were       rounded as well as oval, they were hypoechoic and had well defined       margins. Fine needle biopsy was performed of one of the lymph nodes.       Color Doppler imaging was utilized prior to needle puncture to confirm a       lack of significant vascular structures within the needle path. Four       passes were made with the Acquire 22 gauge ultrasound core biopsy needle       using a transgastric approach (Lymph node was sampled first before the       previously noted pancreatic mass). A visible core of tissue was       obtained. Preliminary cytologic examination and touch preps were       performed. Final cytology results are pending.      Endosonographic imaging in the visualized portion of the liver showed no       mass-lesion.      The celiac region was visualized. Impression:               EGD Impression:                           - No gross lesions in esophagus proximally. Grade I                            esophageal varices distally. Z-line irregular, 38                            cm from the incisors.                           - Erosive gastropathy with no bleeding and no                            stigmata of recent bleeding. No other gross lesions                            in the stomach. Biopsied for HP.                           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 Biopsied superficially.                           EUS Impression:                           -  A mass was identified in the pancreatic head.                            Cytology results are pending. However, the                            endosonographic appearance is highly suspicious for                             adenocarcinoma. This was staged T2 N1 Mx by EUS                            criteria. However, based on MRI imaging/size on the                            MRICP, this would be staged T3 N1 Mx. The staging                            applies if malignancy is confirmed. Fine needle                            biopsy performed.                           - The pancreatic duct had intraductal stones in the                            pancreatic head and genu of the pancreas. The                            pancreatic duct had a dilated endosonographic                            appearance in the neck/body which tapered to the                            tail but then dilated again without discrete                            mass/lesion/stricture in the tail.                           - Hyperechoic material consistent with sludge was                            visualized endosonographically in the gallbladder.                           - There was dilation in the common bile duct and in                            the common hepatic duct.                           -  A few malignant-appearing lymph nodes were                            visualized in the gastrohepatic ligament (level 18)                            and porta hepatis region. Cytology results are                            pending. However, the endosonographic appearance is                            suspicious for metastatic pancreatic                            adenocarcinoma. Fine needle biopsy performed. Moderate Sedation:      Not Applicable - Patient had care per Anesthesia. Recommendation:           - Proceed to scheduled ERCP.                           - Follow up Cytology and Pathology.                           - The findings and recommendations were discussed                            with the patient.                           - The findings and recommendations were discussed                            with the referring  physician. Procedure Code(s):        --- Professional ---                           [phone], Esophagogastroduodenoscopy, flexible,                            transoral; with transendoscopic ultrasound-guided                            intramural or transmural fine needle                            aspiration/biopsy(s), (includes endoscopic                            ultrasound examination limited to the esophagus,                            stomach or duodenum, and adjacent structures) Diagnosis Code(s):        --- Professional ---                           I85.00,  Esophageal varices without bleeding                           K22.8, Other specified diseases of esophagus                           K31.89, Other diseases of stomach and duodenum                           K86.89, Other specified diseases of pancreas                           R93.3, Abnormal findings on diagnostic imaging of                            other parts of digestive tract                           K83.8, Other specified diseases of biliary tract                           I89.9, Noninfective disorder of lymphatic vessels                            and lymph nodes, unspecified                           R94.5, Abnormal results of liver function studies CPT copyright 2019 American Medical Association. All rights reserved. The codes documented in this report are preliminary and upon coder review may  be revised to meet current compliance requirements. Justice Britain, MD 09/22/2019 4:06:42 PM Number of Addenda: 0

## 2019-09-23 ENCOUNTER — Inpatient Hospital Stay (HOSPITAL_COMMUNITY): Payer: BC Managed Care – PPO

## 2019-09-23 ENCOUNTER — Telehealth: Payer: Self-pay | Admitting: Nurse Practitioner

## 2019-09-23 ENCOUNTER — Encounter: Payer: Self-pay | Admitting: Gastroenterology

## 2019-09-23 ENCOUNTER — Encounter: Payer: Self-pay | Admitting: *Deleted

## 2019-09-23 ENCOUNTER — Other Ambulatory Visit: Payer: Self-pay

## 2019-09-23 LAB — HCV RNA QUANT: HCV Quantitative: NOT DETECTED IU/mL (ref >50)

## 2019-09-23 LAB — CBC WITH DIFFERENTIAL/PLATELET
Abs Immature Granulocytes: 0.1 10*3/uL — ABNORMAL HIGH (ref 0.00–0.07)
Basophils Absolute: 0 10*3/uL (ref 0.0–0.1)
Basophils Relative: 0 %
Eosinophils Absolute: 0 10*3/uL (ref 0.0–0.5)
Eosinophils Relative: 0 %
HCT: 32.2 % — ABNORMAL LOW (ref 36.0–46.0)
Hemoglobin: 10.5 g/dL — ABNORMAL LOW (ref 12.0–15.0)
Immature Granulocytes: 1 %
Lymphocytes Relative: 10 %
Lymphs Abs: 1 10*3/uL (ref 0.7–4.0)
MCH: 29.5 pg (ref 26.0–34.0)
MCHC: 32.6 g/dL (ref 30.0–36.0)
MCV: 90.4 fL (ref 80.0–100.0)
Monocytes Absolute: 0.5 10*3/uL (ref 0.1–1.0)
Monocytes Relative: 5 %
Neutro Abs: 8.5 10*3/uL — ABNORMAL HIGH (ref 1.7–7.7)
Neutrophils Relative %: 84 %
Platelets: 412 10*3/uL — ABNORMAL HIGH (ref 150–400)
RBC: 3.56 MIL/uL — ABNORMAL LOW (ref 3.87–5.11)
RDW: 17.2 % — ABNORMAL HIGH (ref 11.5–15.5)
WBC: 10 10*3/uL (ref 4.0–10.5)
nRBC: 0 % (ref 0.0–0.2)

## 2019-09-23 LAB — HEPATIC FUNCTION PANEL
ALT: 137 U/L — ABNORMAL HIGH (ref 0–44)
AST: 198 U/L — ABNORMAL HIGH (ref 15–41)
Albumin: 2.2 g/dL — ABNORMAL LOW (ref 3.5–5.0)
Alkaline Phosphatase: 438 U/L — ABNORMAL HIGH (ref 38–126)
Bilirubin, Direct: 8.2 mg/dL — ABNORMAL HIGH (ref 0.0–0.2)
Indirect Bilirubin: 6.5 mg/dL — ABNORMAL HIGH (ref 0.3–0.9)
Total Bilirubin: 14.7 mg/dL — ABNORMAL HIGH (ref 0.3–1.2)
Total Protein: 6.9 g/dL (ref 6.5–8.1)

## 2019-09-23 LAB — CYTOLOGY - NON PAP

## 2019-09-23 LAB — BASIC METABOLIC PANEL
Anion gap: 9 (ref 5–15)
BUN: 15 mg/dL (ref 8–23)
CO2: 22 mmol/L (ref 22–32)
Calcium: 8.5 mg/dL — ABNORMAL LOW (ref 8.9–10.3)
Chloride: 100 mmol/L (ref 98–111)
Creatinine, Ser: 0.57 mg/dL (ref 0.44–1.00)
GFR calc Af Amer: >60 mL/min (ref >60)
GFR calc non Af Amer: >60 mL/min (ref >60)
Glucose, Bld: 170 mg/dL — ABNORMAL HIGH (ref 70–99)
Potassium: 4 mmol/L (ref 3.5–5.1)
Sodium: 131 mmol/L — ABNORMAL LOW (ref 135–145)

## 2019-09-23 LAB — SURGICAL PATHOLOGY

## 2019-09-23 LAB — GLUCOSE, CAPILLARY
Glucose-Capillary: 146 mg/dL — ABNORMAL HIGH (ref 70–99)
Glucose-Capillary: 146 mg/dL — ABNORMAL HIGH (ref 70–99)
Glucose-Capillary: 167 mg/dL — ABNORMAL HIGH (ref 70–99)
Glucose-Capillary: 190 mg/dL — ABNORMAL HIGH (ref 70–99)
Glucose-Capillary: 218 mg/dL — ABNORMAL HIGH (ref 70–99)
Glucose-Capillary: 296 mg/dL — ABNORMAL HIGH (ref 70–99)

## 2019-09-23 LAB — LIPASE, BLOOD: Lipase: 310 U/L — ABNORMAL HIGH (ref 11–51)

## 2019-09-23 IMAGING — CT CT CHEST W/ CM
2 of 3 series · 15 of 36 positions shown, 18 images · IV contrast (omnipaque)
Comparison: Abdominal MRI dated [DATE] and ultrasound dated
[DATE]

CLINICAL DATA: 63-year-old female with pancreatic cancer staging.

EXAM:
CT CHEST WITH CONTRAST
TECHNIQUE: Multidetector CT imaging of the chest was performed during
intravenous contrast administration.
CONTRAST:  75mL OMNIPAQUE IOHEXOL 300 MG/ML  SOLN

[Series 2: axial st · axial · 0.73mm/px · z∈[-187,+53]mm · 12 of 142 slices shown, 15 images]
[im 11/142  mediastinal]
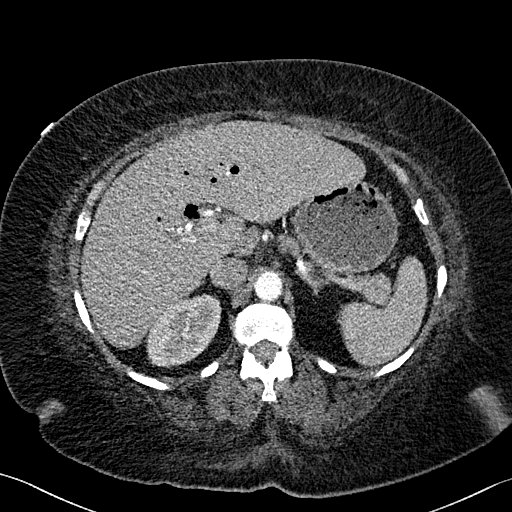
[im 11/142  lung]
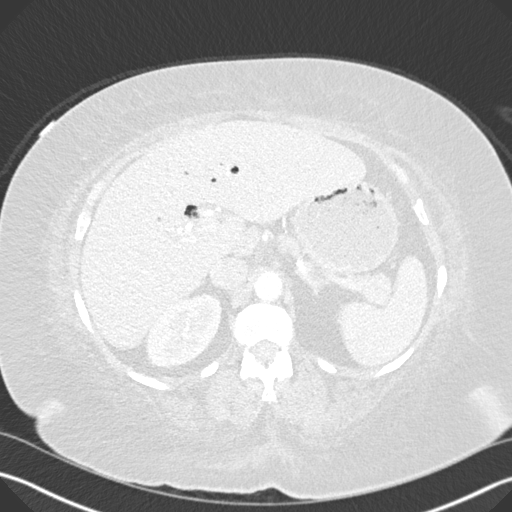
[im 21/142  lung]
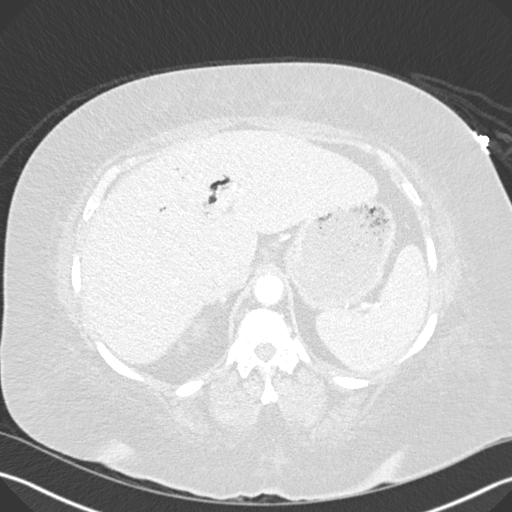
[im 32/142  lung]
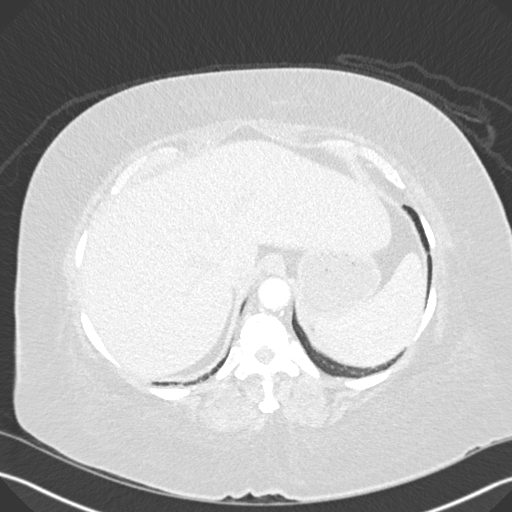
[im 42/142  lung]
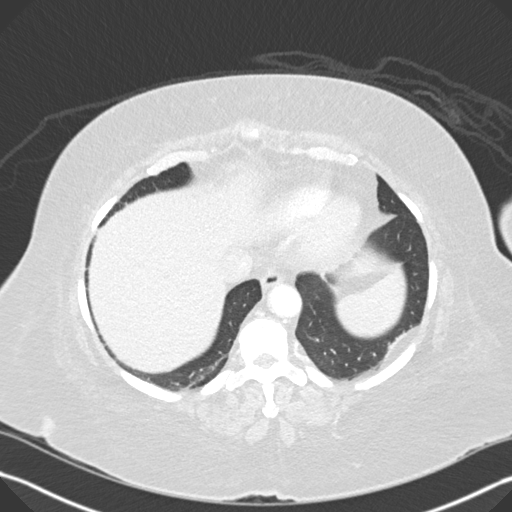
[im 53/142  mediastinal]
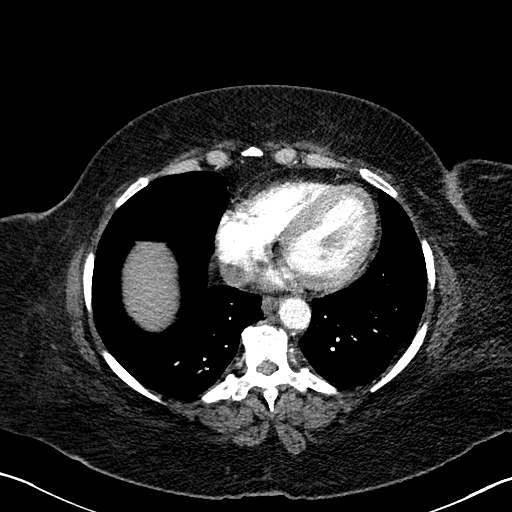
[im 53/142  lung]
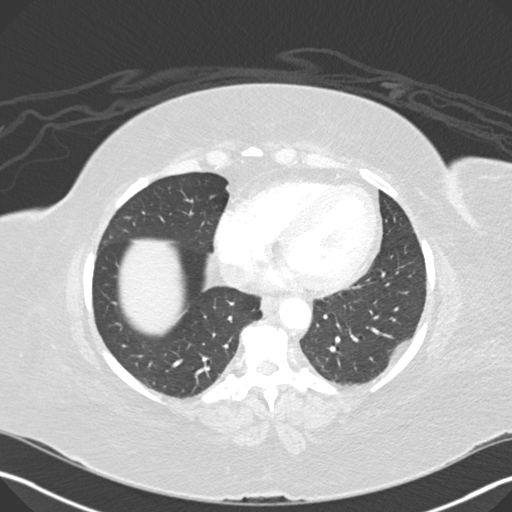
[im 63/142  lung]
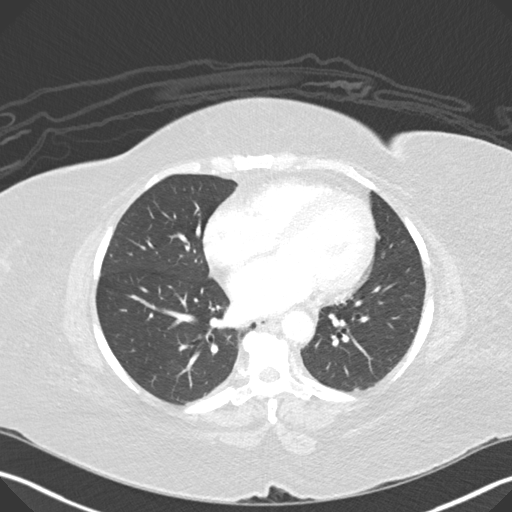
[im 79/142  lung]
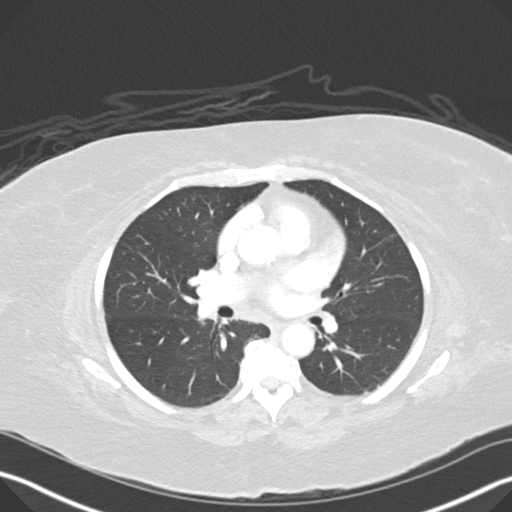
[im 89/142  lung]
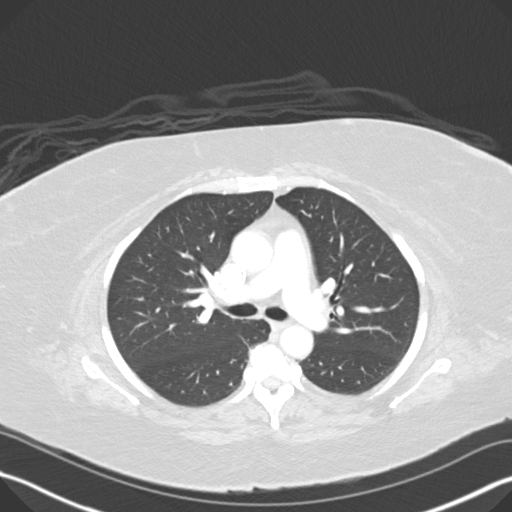
[im 100/142  mediastinal]
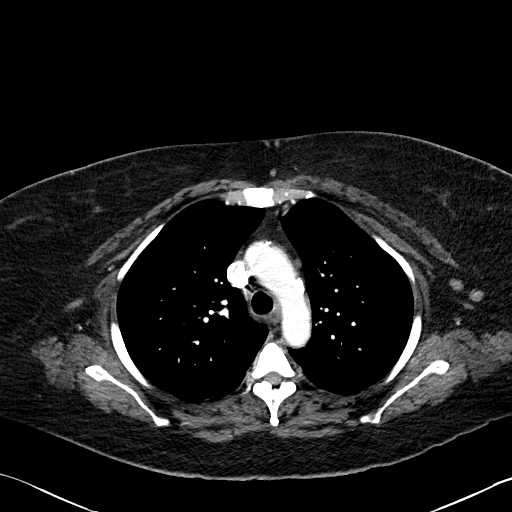
[im 100/142  lung]
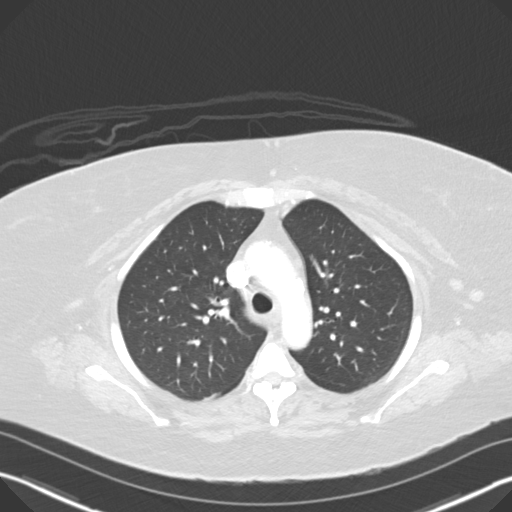
[im 110/142  lung]
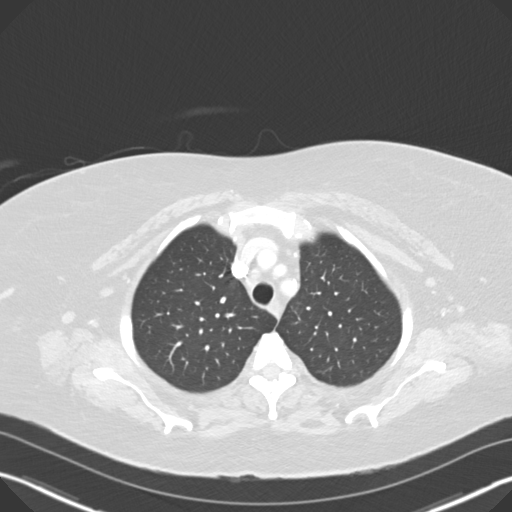
[im 121/142  lung]
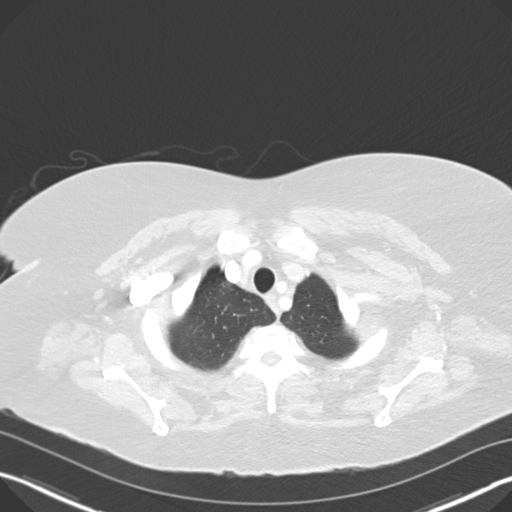
[im 131/142  lung]
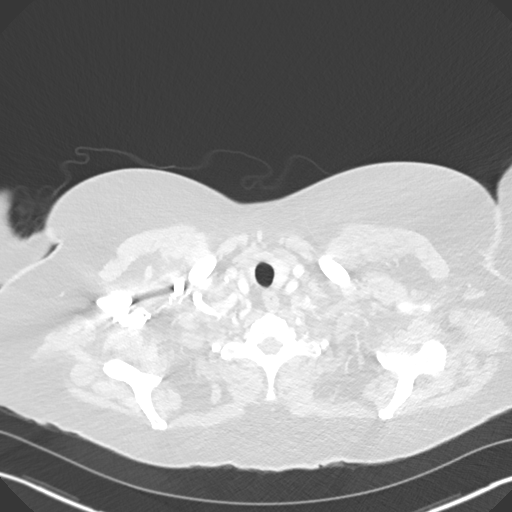

[Series 6: coronal · coronal · 0.57mm/px · 3 of 134 slices shown]
[im 27/134  lung]
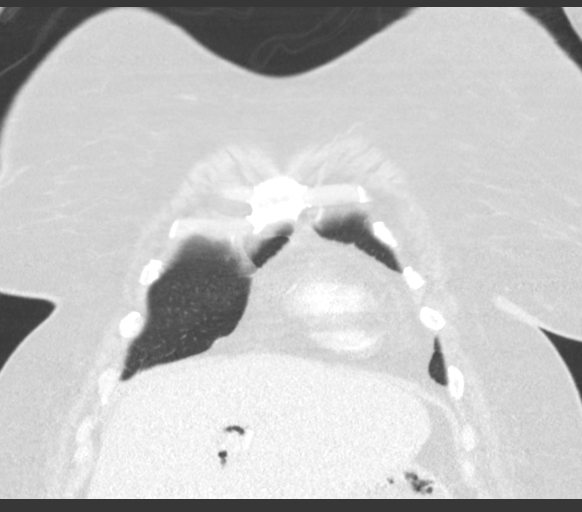
[im 54/134  lung]
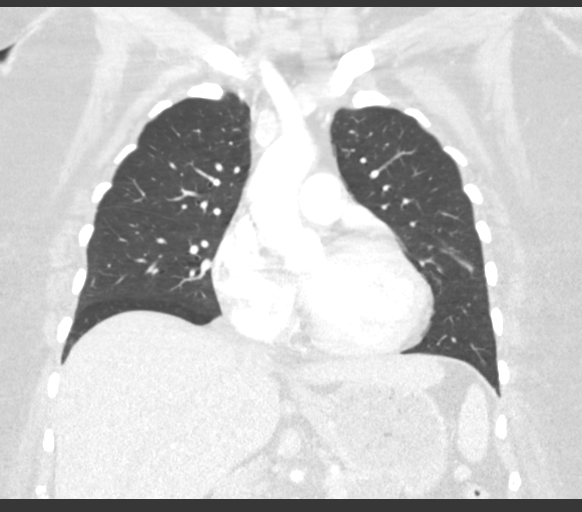
[im 80/134  lung]
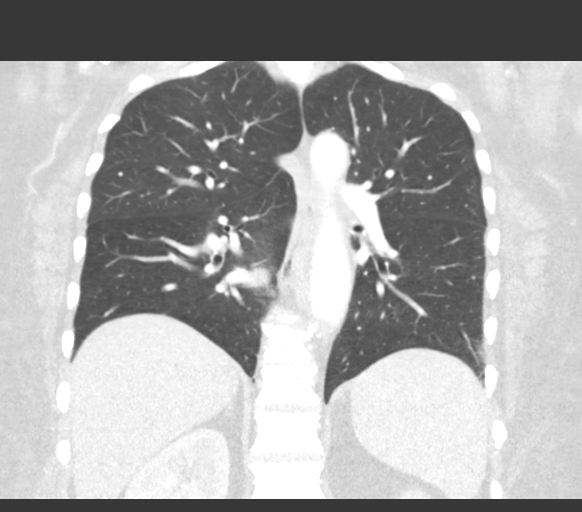

[15 of 36 positions shown; findings below may reference images not displayed]

FINDINGS: Cardiovascular: There is no cardiomegaly or pericardial effusion.
Coronary vascular calcifications primarily involving the RCA and
left circumflex artery. There is mild atherosclerotic calcification
of the thoracic aorta. No aneurysmal dilatation or dissection. The
origins of the great vessels of the aortic arch appear patent as
visualized. The central pulmonary arteries are patent.

Mediastinum/Nodes: There is no hilar or mediastinal adenopathy.
Mildly rounded and enlarged lymph node in the right cardiophrenic
angle (series 2, image 97) measures 11 mm in diameter. The esophagus
and the thyroid gland are grossly unremarkable. No mediastinal fluid
collection.

Lungs/Pleura: The lungs are clear. There is no pleural effusion or
pneumothorax. The central airways are patent.

Upper Abdomen: Pneumobilia and partially visualized biliary stent.
There is a 3.2 x 1.7 cm low attenuating lesion in the body of the
pancreas. Several mildly enlarged gastrohepatic lymph nodes
including a 12 mm mildly rounded lymph node (series 2, image 126).

Musculoskeletal: Mild degenerative changes of the spine. No acute
osseous pathology.
IMPRESSION: 1. No acute intrathoracic pathology. No CT evidence of intrathoracic
metastatic disease. A single mildly enlarged and rounded lymph node
at the right cardiophrenic angle measures 11 mm in diameter.
2. Partially visualized CBD stent and associated pneumobilia.
3. A low attenuating mass in the distal body of the pancreas and
several mildly enlarged gastrohepatic lymph nodes.

## 2019-09-23 MED ORDER — LACTATED RINGERS IV BOLUS
500.0000 mL | Freq: Once | INTRAVENOUS | Status: AC
  Administered 2019-09-23: 500 mL via INTRAVENOUS

## 2019-09-23 MED ORDER — LACTATED RINGERS IV SOLN: INTRAVENOUS | Status: DC

## 2019-09-23 MED ORDER — IOHEXOL 300 MG/ML  SOLN
75.0000 mL | Freq: Once | INTRAMUSCULAR | Status: AC | PRN
  Administered 2019-09-23: 75 mL via INTRAVENOUS

## 2019-09-23 NOTE — Telephone Encounter (Signed)
Patient daughter-in-law Roselind Rily [id number] would like for Wilfred Lacy to call her. Patient daughter in-law is listed on DPR.

## 2019-09-23 NOTE — Telephone Encounter (Signed)
Okey to fill out FMLA? Please advise

## 2019-09-23 NOTE — Progress Notes (Signed)
[facility] Progress Note  CC:  Obstructive jaundice, pancreatic mass, malignant appearing biliary stricture   Subjective: She reports feeling well today. She is tolerating a regular diet. No N/V or abdominal pain. She passed a greenish soft stool yesterday. Waiting for EUS/ERCP pathology report.    Objective:   S/P ERCP 09/22/2019:  -The major papilla appeared to be bulging and congested as a result of the submucosal protrusion of the mass. - A single severe biliary stricture was found in the lower third of the main bile duct. The stricture was malignant appearing. - The upper third of the main bile duct, middle third of the main bile duct and left and right hepatic ducts and all intrahepatic branches were severely dilated, secondary to the stricture. - A biliary sphincterotomy was performed. - Cells for cytology obtained in the lower third of the main duct stricture. - One uncovered metal biliary stent was placed into the common bile duct. This was dilated to ensure stent expansion.  S/P EUS 09/22/2019:  - A mass was identified in the pancreatic head. Cytology results are pending. However, the endosonographic appearance is highly suspicious for adenocarcinoma. This was staged T2 N1 Mx by EUS criteria. However, based on MRI imaging/size on the MRICP, this would be staged T3 N1 Mx. The staging applies if malignancy is confirmed. Fine needle biopsy performed. - The pancreatic duct had intraductal stones in the pancreatic head and genu of the pancreas. The pancreatic duct had a dilated endosonographic appearance in the neck/body which tapered to the tail but then dilated again without discrete mass/lesion/stricture in the tail. - Hyperechoic material consistent with sludge was visualized endosonographically in the gallbladder. - There was dilation in the common bile duct and in the common hepatic duct. - A few malignant-appearing lymph nodes were visualized in the  gastrohepatic ligament (level 18) and porta hepatis region. Cytology results are pending. However, the endosonographic appearance is suspicious for metastatic pancreatic adenocarcinoma. Fine needle biopsy performed.  Vital signs in last 24 hours: Temp:  [97.8 F (36.6 C)-99.2 F (37.3 C)] 98.4 F (36.9 C) (02/04 0408) Pulse Rate:  [68-88] 74 (02/04 0408) Resp:  [16-24] 20 (02/04 0408) BP: (135-179)/(56-85) 159/85 (02/04 0408) SpO2:  [97 %-100 %] 100 % (02/04 0408) Weight:  [97.2 kg] 97.2 kg (02/04 0500) Last BM Date: 09/22/19 General:   Alert 64 year old female in NAD. Eyes: Scleral icterus present.  Heart: RRR, 1/6 systolic murmur. Pulm:  Breath sounds clear throughout. Abdomen: Soft, nondistended, nontender. + BS x 4 quads. No HSM.  Extremities:  Without edema. Neurologic:  Alert and  oriented x4;  grossly normal neurologically. Psych:  Alert and cooperative. Normal mood and affect.  Intake/Output from previous day: 02/03 0701 - 02/04 0700 In: 1118 [P.O.:240; I.V.:800; IV Piggyback:78] Out: 0  Intake/Output this shift: No intake/output data recorded.  Lab Results: Recent Labs    09/21/19 0509 09/22/19 0539 09/23/19 0523  WBC 11.0* 9.9 10.0  HGB 10.8* 10.4* 10.5*  HCT 31.9* 31.6* 32.2*  PLT 397 383 412*   BMET Recent Labs    09/21/19 0509 09/22/19 0539 09/23/19 0523  NA 132* 133* 131*  K 2.9* 3.3* 4.0  CL 98 101 100  CO2 '24 23 22  ' GLUCOSE 125* 134* 170*  BUN 8 6* 15  CREATININE <0.30* 0.38* 0.57  CALCIUM 8.6* 8.6* 8.5*   LFT Recent Labs    09/23/19 0523  PROT 6.9  ALBUMIN 2.2*  AST 198*  ALT 137*  ALKPHOS 438*  BILITOT 14.7*  BILIDIR 8.2*  IBILI 6.5*   PT/INR Recent Labs    09/20/19 1124  LABPROT 14.2*  INR 1.3*   Hepatitis Panel No results for input(s): HEPBSAG, HCVAB, HEPAIGM, HEPBIGM in the last 72 hours.  DG ERCP BILIARY & PANCREATIC DUCTS  Result Date: 09/22/2019 CLINICAL DATA:  64 year old female with a history of jaundice EXAM:  ERCP TECHNIQUE: Multiple spot images obtained with the fluoroscopic device and submitted for interpretation post-procedure. FLUOROSCOPY TIME:  Fluoroscopy Time:  4 minutes 24 seconds COMPARISON:  None. FINDINGS: Limited intraoperative fluoroscopic spot images during ERCP. Initial image demonstrates endoscope projecting over the upper abdomen. Vague calcifications/densities of the upper abdomen may reflect calcifications of the pancreatic head. Subsequently there is cannulation of the ampulla and retrograde infusion of contrast with partial opacification of the extrahepatic biliary ductal system. There is then placement of a metallic biliary stent and PTA biliary stricture. IMPRESSION: Limited images of ERCP demonstrates treatment of biliary stricture with stenting and PTA. Please refer to the dictated operative report for full details of intraoperative findings and procedure. Electronically Signed   By: Corrie Mckusick D.O.   On: 09/22/2019 16:26    Assessment / Plan:  40. 64 year old female with obstructive jaundice. Abdominal MRI/MRCP 09/19/2018 identified biliary and pancreatic duct obstruction with sludge or stones in the CBD and intraductal calculi in the dilated pancreatic duct (some calculi may be as large as 1cm), a pancreatic/ampullary mass which extends into the duodenum with obstruction of the biliary and pancreatic ducts concerning for ampullary vs pancreatic adenocarcinoma and a questionable small lesion to the tail of the pancreas. S/ P ERCP with biliary sphincterotomy and metal stent placement to the CBD 2/3. A severe biliary stricture to the lower main bile duct was identified with left and right hepatic duct dilatation S/P EUS 2/3 confirmed a pancreatic mass concerning for adenocarcinoma and pancreatic intraductal stones in the head of the pancreas. A few malignant appearing lymph nodes to the gastrohepatic ligament and porta hepatis region.  Cytology results pending.  T. Bili 14.7. Alk phos 438.  AST 198. ALT 137. WBC 10. Elevated Lipase 310 post ERCP/EUS. She is afebrile. No abdominal pain. No N/V. -LR 500cc IV  fluid bolus currently infusing then @ 125cc/hr -Continue Zosyn  -Await pathology results -Diet as tolerated  -Repeat CMP, Lipase in am  2. Chronic Hepatitis C GT1a with cirrhosis treated with Harvoni in 2019 with SVR.  -Hep C RNA Quant -Eventual EGD to survey for esophageal varices  -further follow up as outpatient   3. Normocytic Anemia. Hg 11.5 >> 10.8 >> 10.5. HCT 33.9 >>31.9 >> 32.2.  No signs of active GI bleeding.  -Repeat CBC in am   4. Hyponatremia. Na 131.   5. Hypokalemia, resolved. K+ 4.0.   6. DM II  Further recommendations per Dr. Rush Landmark   Principal Problem:   Abnormal liver function Active Problems:   DM (diabetes mellitus) ([place])   Hypokalemia   Hyponatremia   Pancreatic mass     LOS: 3 days   Noralyn Pick  09/23/2019, 9:31 AM

## 2019-09-23 NOTE — Telephone Encounter (Signed)
Patient daughter in-law dropped off FMLA forms that needs to be complete. Please fax to [phone] once completed and leave a copy for pick-up once complete. Please call Sabra Heck DPR) at [phone] when ready for pick up. FMLA  forms are in [facility] file in the front office.

## 2019-09-23 NOTE — Telephone Encounter (Signed)
Ok to complete, but we might want to wait till she is discharge from the hospital

## 2019-09-23 NOTE — Progress Notes (Signed)
PROGRESS NOTE    Melanie Alvarez  XBL:[id number] DOB: [birth year] DOA: 09/20/2019 PCP: Flossie Buffy, NP    Brief Narrative: Melanie Alvarez is a 64 y.o. female with significant medical history of diabetes mellitus type 2, hypertension, obesity, hepatitis C status post treatment, cirrhosis from her alcohol abuse. Patient presented secondary to abnormal liver labs with concern for malignant pancreatic mass. GI consulted and EUS/ERCP performed on 2/3.   Assessment & Plan:   Principal Problem:   Abnormal liver function Active Problems:   DM (diabetes mellitus) ([facility])   Hypokalemia   Hyponatremia   Pancreatic mass   Elevated AST/ALT/Alkaline phosphatase Elevated bilirubin Pancreatic mass Concern for malignancy. GI consulted and ERCP/EUS performed 2/3.  -GI recommendations: pathology pending. Stent placed. -Daily CMP -Oncology consult  Chronic hepatitis C History of cirrhosis. S/p treatment with Harvoni.  Hypokalemia Treated with supplementation  CBD dilation Secondary to mass effect. Stent placed as mentioned above. Bilirubin trended down  Erosive gastropathy -Biopsied for H. Pylori  UTI Mentioned in prior notes. Urinalysis does not suggest UTI. No urine culture obtained. -Discontinue Zosyn  Hyponatremia Mild. Improvement with IV fluids  Normocytic anemia Unknown etiology but possibly related to malignancy. Stable.  Essential hypertension Not well controlled. Patient is on metoprolol, amlodipine as an outpatient -Continue amlodipine and metoprolol  Diabetes mellitus, type 2 Patient is on metformin as an outpatient -Continue SSI  Obesity Body mass index is 37.96 kg/m.  DVT prophylaxis: SCDs Code Status:   Code Status: Full Code Family Communication: None at bedside Disposition Plan: Discharge pending GI recommendations   Consultants:   [facility]  Procedures:   ERCP (09/22/2019) Impression:               - The major papilla appeared to be  bulging and                            congested as a result of the submucosal protrusion                            of the mass.                           - A single severe biliary stricture was found in                            the lower third of the main bile duct. The                            stricture was malignant appearing.                           - The upper third of the main bile duct, middle                            third of the main bile duct and left and right                            hepatic ducts and all intrahepatic branches were  severely dilated, secondary to the stricture.                           - A biliary sphincterotomy was performed.                           - Cells for cytology obtained in the lower third of                            the main duct stricture.                           - One uncovered metal biliary stent was placed into                            the common bile duct. This was dilated to ensure                            stent expansion.  Recommendation:           - The patient will be observed post-procedure,                            until all discharge criteria are met.                           - Return patient to hospital ward for ongoing care.                           - Check liver enzymes (AST, ALT, alkaline                            phosphatase, bilirubin) in the morning as well as a                            CA19-9.                           - Observe patient's clinical course.                           - Await cytology results.                           - Watch for pancreatitis, bleeding, perforation,                            and cholangitis.                           - Would consider CT-Chest to complete staging for                            patient.                           -  Oncology referral as inpatient vs outpatient                            based on patient status in the next 24-48  hours                            should be obtained.                           - Hold Chemical VTE PPx for 48 hours to decrease                            risk of post-sphincterotomy bleeding if possible -                            SCDs, TEDs OK as well as UOB.  Upper EUS (09/22/2019) Impression:               EGD Impression:                           - No gross lesions in esophagus proximally. Grade I                            esophageal varices distally. Z-line irregular, 38                            cm from the incisors.                           - Erosive gastropathy with no bleeding and no                            stigmata of recent bleeding. No other gross lesions                            in the stomach. Biopsied for HP.                           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 Biopsied superficially.                           EUS Impression:                           - A mass was identified in the pancreatic head.                            Cytology results are pending. However, the  endosonographic appearance is highly suspicious for                            adenocarcinoma. This was staged T2 N1 Mx by EUS                            criteria. However, based on MRI imaging/size on the                            MRICP, this would be staged T3 N1 Mx. The staging                            applies if malignancy is confirmed. Fine needle                            biopsy performed.                           - The pancreatic duct had intraductal stones in the                            pancreatic head and genu of the pancreas. The                            pancreatic duct had a dilated endosonographic                            appearance in the neck/body which tapered to the                            tail  but then dilated again without discrete                            mass/lesion/stricture in the tail.                           - Hyperechoic material consistent with sludge was                            visualized endosonographically in the gallbladder.                           - There was dilation in the common bile duct and in                            the common hepatic duct.                           - A few malignant-appearing lymph nodes were                            visualized in the gastrohepatic ligament (level 18)  and porta hepatis region. Cytology results are                            pending. However, the endosonographic appearance is                            suspicious for metastatic pancreatic                            adenocarcinoma. Fine needle biopsy performed.  Recommendation:           - Proceed to scheduled ERCP.                           - Follow up Cytology and Pathology.  Antimicrobials:  Zosyn    Subjective: No abdominal pain.  Objective: Vitals:   09/22/19 1624 09/22/19 1956 09/23/19 0408 09/23/19 0500  BP: 135/69 (!) 149/73 (!) 159/85   Pulse: 73 77 74   Resp: '16 20 20   ' Temp: 97.9 F (36.6 C) 97.8 F (36.6 C) 98.4 F (36.9 C)   TempSrc: Oral Oral Oral   SpO2: 100% 100% 100%   Weight:    97.2 kg  Height:        Intake/Output Summary (Last 24 hours) at 09/23/2019 1329 Last data filed at 09/22/2019 1956 Gross per 24 hour  Intake 1118 ml  Output 0 ml  Net 1118 ml   Filed Weights   09/22/19 0500 09/22/19 0554 09/23/19 0500  Weight: 96.2 kg 97.3 kg 97.2 kg    Examination:  General exam: Appears calm and comfortable Eyes: Scleral icterus Respiratory system: Clear to auscultation. Respiratory effort normal. Cardiovascular system: S1 & S2 heard, RRR. No murmurs, rubs, gallops or clicks. Gastrointestinal system: Abdomen is distended, soft and nontender. Normal bowel sounds heard. Central nervous system: Alert  and oriented. No focal neurological deficits. Extremities: No edema. No calf tenderness Skin: No cyanosis. No rashes Psychiatry: Judgement and insight appear normal. Mood & affect appropriate.    Data Reviewed: I have personally reviewed following labs and imaging studies  CBC: Recent Labs  Lab 09/20/19 1124 09/21/19 0509 09/22/19 0539 09/23/19 0523  WBC 11.8* 11.0* 9.9 10.0  NEUTROABS 7.8*  --   --  8.5*  HGB 11.5* 10.8* 10.4* 10.5*  HCT 33.9* 31.9* 31.6* 32.2*  MCV 89.1 88.6 90.0 90.4  PLT 415.0 Repeated and verified X2.* 397 383 287*   Basic Metabolic Panel: Recent Labs  Lab 09/20/19 1124 09/21/19 0509 09/22/19 0539 09/23/19 0523  NA 129* 132* 133* 131*  K 3.1* 2.9* 3.3* 4.0  CL 92* 98 101 100  CO2 '26 24 23 22  ' GLUCOSE 111* 125* 134* 170*  BUN 11 8 6* 15  CREATININE 0.98 <0.30* 0.38* 0.57  CALCIUM 9.4 8.6* 8.6* 8.5*  MG  --   --  1.8  --    GFR: Estimated Creatinine Clearance: 80.9 mL/min (by C-G formula based on SCr of 0.57 mg/dL). Liver Function Tests: Recent Labs  Lab 09/20/19 1124 09/21/19 0509 09/22/19 0539 09/23/19 0523  AST 248* 236* 234* 198*  ALT 157* 138* 138* 137*  ALKPHOS 601* 450* 438* 438*  BILITOT 26.6*  23.7* 21.5* 21.2* 14.7*  PROT 7.4 6.8 6.8 6.9  ALBUMIN 3.4* 2.2* 2.3* 2.2*   Recent Labs  Lab 09/23/19 0523  LIPASE 310*  No results for input(s): AMMONIA in the last 168 hours. Coagulation Profile: Recent Labs  Lab 09/20/19 1124  INR 1.3*   Cardiac Enzymes: Recent Labs  Lab 09/21/19 0509  CKTOTAL 78  CKMB 1.6   BNP (last 3 results) No results for input(s): PROBNP in the last 8760 hours. HbA1C: No results for input(s): HGBA1C in the last 72 hours. CBG: Recent Labs  Lab 09/22/19 1958 09/22/19 2339 09/23/19 0410 09/23/19 0734 09/23/19 1137  GLUCAP 186* 221* 218* 167* 296*   Lipid Profile: No results for input(s): CHOL, HDL, LDLCALC, TRIG, CHOLHDL, LDLDIRECT in the last 72 hours. Thyroid Function Tests: No  results for input(s): TSH, T4TOTAL, FREET4, T3FREE, THYROIDAB in the last 72 hours. Anemia Panel: No results for input(s): VITAMINB12, FOLATE, FERRITIN, TIBC, IRON, RETICCTPCT in the last 72 hours. Sepsis Labs: No results for input(s): PROCALCITON, LATICACIDVEN in the last 168 hours.  Recent Results (from the past 240 hour(s))  SARS CORONAVIRUS 2 (TAT 6-24 HRS) Nasopharyngeal Nasopharyngeal Swab     Status: None   Collection Time: 09/20/19  9:35 PM   Specimen: Nasopharyngeal Swab  Result Value Ref Range Status   SARS Coronavirus 2 NEGATIVE NEGATIVE Final    Comment: (NOTE) SARS-CoV-2 target nucleic acids are NOT DETECTED. The SARS-CoV-2 RNA is generally detectable in upper and lower respiratory specimens during the acute phase of infection. Negative results do not preclude SARS-CoV-2 infection, do not rule out co-infections with other pathogens, and should not be used as the sole basis for treatment or other patient management decisions. Negative results must be combined with clinical observations, patient history, and epidemiological information. The expected result is Negative. Fact Sheet for Patients: SugarRoll.be Fact Sheet for Healthcare Providers: https://www.woods-mathews.com/ This test is not yet approved or cleared by the [place] FDA and  has been authorized for detection and/or diagnosis of SARS-CoV-2 by FDA under an Emergency Use Authorization (EUA). This EUA will remain  in effect (meaning this test can be used) for the duration of the COVID-19 declaration under Section 56 4(b)(1) of the Act, 21 U.S.C. section 360bbb-3(b)(1), unless the authorization is terminated or revoked sooner. Performed at [facility], [place] [address], [place], [place] [address]          Radiology Studies: DG ERCP BILIARY & PANCREATIC DUCTS  Result Date: 09/22/2019 CLINICAL DATA:  64 year old female with a history of jaundice EXAM: ERCP  TECHNIQUE: Multiple spot images obtained with the fluoroscopic device and submitted for interpretation post-procedure. FLUOROSCOPY TIME:  Fluoroscopy Time:  4 minutes 24 seconds COMPARISON:  None. FINDINGS: Limited intraoperative fluoroscopic spot images during ERCP. Initial image demonstrates endoscope projecting over the upper abdomen. Vague calcifications/densities of the upper abdomen may reflect calcifications of the pancreatic head. Subsequently there is cannulation of the ampulla and retrograde infusion of contrast with partial opacification of the extrahepatic biliary ductal system. There is then placement of a metallic biliary stent and PTA biliary stricture. IMPRESSION: Limited images of ERCP demonstrates treatment of biliary stricture with stenting and PTA. Please refer to the dictated operative report for full details of intraoperative findings and procedure. Electronically Signed   By: Corrie Mckusick D.O.   On: 09/22/2019 16:26        Scheduled Meds: . amLODipine  10 mg Oral QHS  . feeding supplement  1 Container Oral TID BM  . indomethacin  100 mg Rectal Once  . insulin aspart  0-9 Units Subcutaneous Q4H  . irbesartan  37.5 mg Oral Daily  . metoprolol  succinate  25 mg Oral Daily   Continuous Infusions: . piperacillin-tazobactam (ZOSYN)  IV 3.375 g (09/22/19 2143)     LOS: 3 days     Cordelia Poche, MD [facility] 09/23/2019, 1:29 PM  If 7PM-7AM, please contact night-coverage [email or web address]

## 2019-09-24 ENCOUNTER — Other Ambulatory Visit: Payer: Self-pay

## 2019-09-24 ENCOUNTER — Telehealth: Payer: Self-pay

## 2019-09-24 ENCOUNTER — Inpatient Hospital Stay (HOSPITAL_COMMUNITY): Payer: BC Managed Care – PPO

## 2019-09-24 ENCOUNTER — Encounter (HOSPITAL_COMMUNITY): Payer: Self-pay | Admitting: Internal Medicine

## 2019-09-24 DIAGNOSIS — B182 Chronic viral hepatitis C: Secondary | ICD-10-CM

## 2019-09-24 HISTORY — PX: IR IMAGING GUIDED PORT INSERTION: IMG5740

## 2019-09-24 LAB — GLUCOSE, CAPILLARY
Glucose-Capillary: 143 mg/dL — ABNORMAL HIGH (ref 70–99)
Glucose-Capillary: 150 mg/dL — ABNORMAL HIGH (ref 70–99)
Glucose-Capillary: 162 mg/dL — ABNORMAL HIGH (ref 70–99)
Glucose-Capillary: 165 mg/dL — ABNORMAL HIGH (ref 70–99)
Glucose-Capillary: 158 mg/dL — ABNORMAL HIGH (ref 70–99)

## 2019-09-24 LAB — COMPREHENSIVE METABOLIC PANEL
ALT: 111 U/L — ABNORMAL HIGH (ref 0–44)
AST: 138 U/L — ABNORMAL HIGH (ref 15–41)
Albumin: 2.1 g/dL — ABNORMAL LOW (ref 3.5–5.0)
Alkaline Phosphatase: 324 U/L — ABNORMAL HIGH (ref 38–126)
Anion gap: 7 (ref 5–15)
BUN: 19 mg/dL (ref 8–23)
CO2: 26 mmol/L (ref 22–32)
Calcium: 8.4 mg/dL — ABNORMAL LOW (ref 8.9–10.3)
Chloride: 100 mmol/L (ref 98–111)
Creatinine, Ser: 0.56 mg/dL (ref 0.44–1.00)
GFR calc Af Amer: >60 mL/min (ref >60)
GFR calc non Af Amer: >60 mL/min (ref >60)
Glucose, Bld: 146 mg/dL — ABNORMAL HIGH (ref 70–99)
Potassium: 3.4 mmol/L — ABNORMAL LOW (ref 3.5–5.1)
Sodium: 133 mmol/L — ABNORMAL LOW (ref 135–145)
Total Bilirubin: 8.7 mg/dL — ABNORMAL HIGH (ref 0.3–1.2)
Total Protein: 6.1 g/dL — ABNORMAL LOW (ref 6.5–8.1)

## 2019-09-24 LAB — CANCER ANTIGEN 19-9: CA 19-9: <2 U/mL (ref 0–35)

## 2019-09-24 LAB — LIPASE, BLOOD: Lipase: 266 U/L — ABNORMAL HIGH (ref 11–51)

## 2019-09-24 IMAGING — XA IR IMAGING GUIDED PORT INSERTION
2 series · 2 of 2 positions shown · non-contrast
Comparison: none

CLINICAL DATA: METASTATIC PANCREAS CARCINOMA

[Series 1: (id) · 1 of 1 slices shown]
[im 1/1]
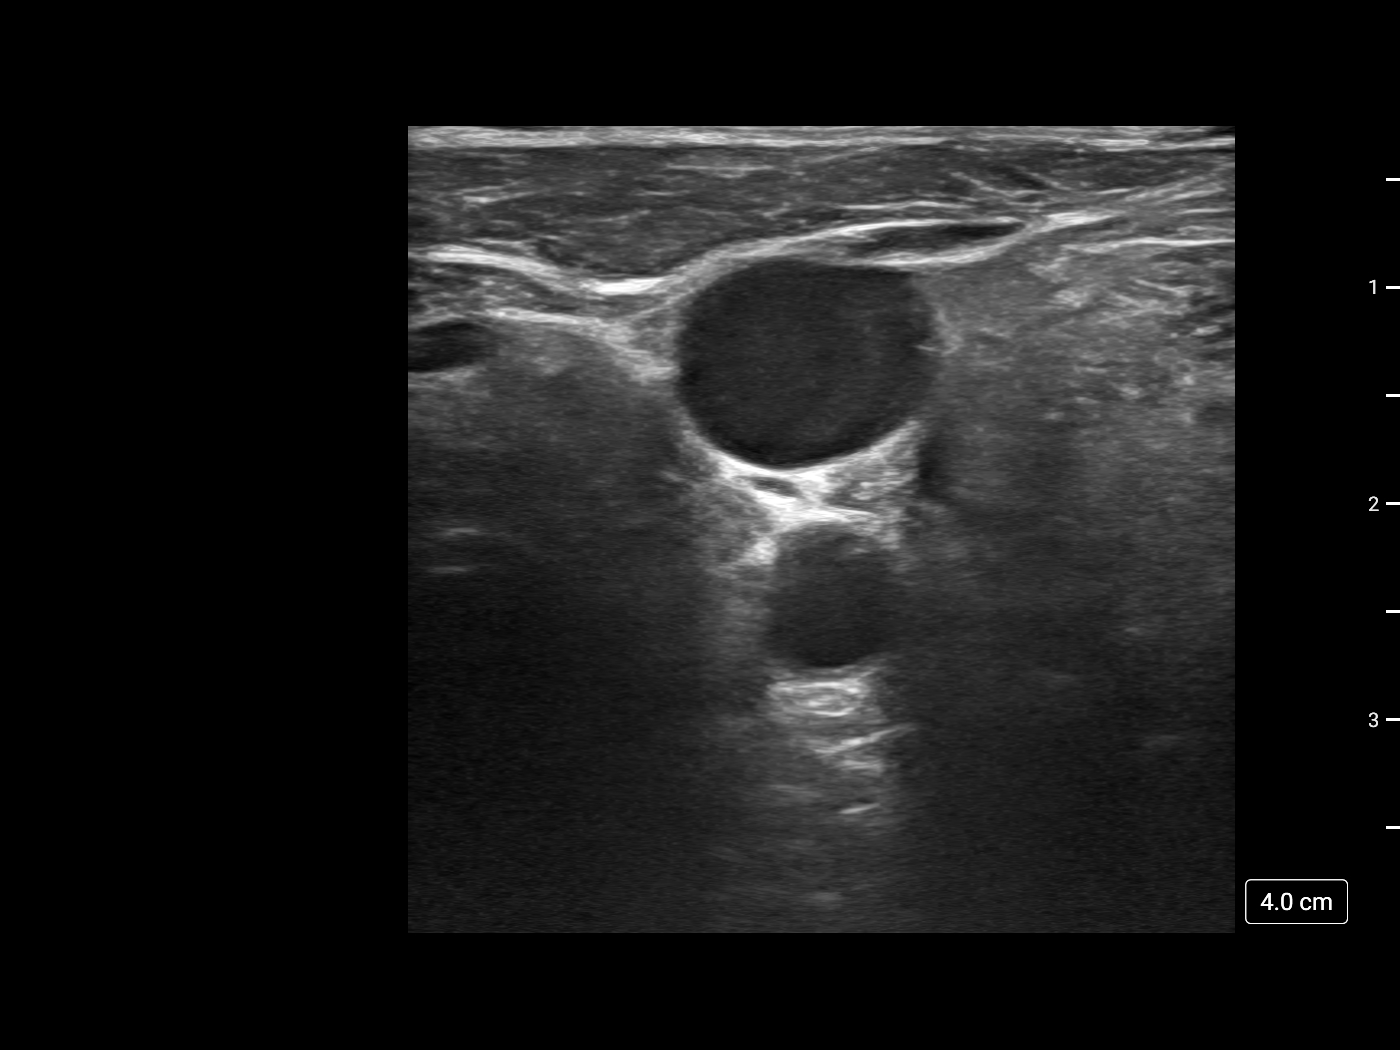

[Series 300: line placements · 1 of 1 slices shown]
[im 1/1]
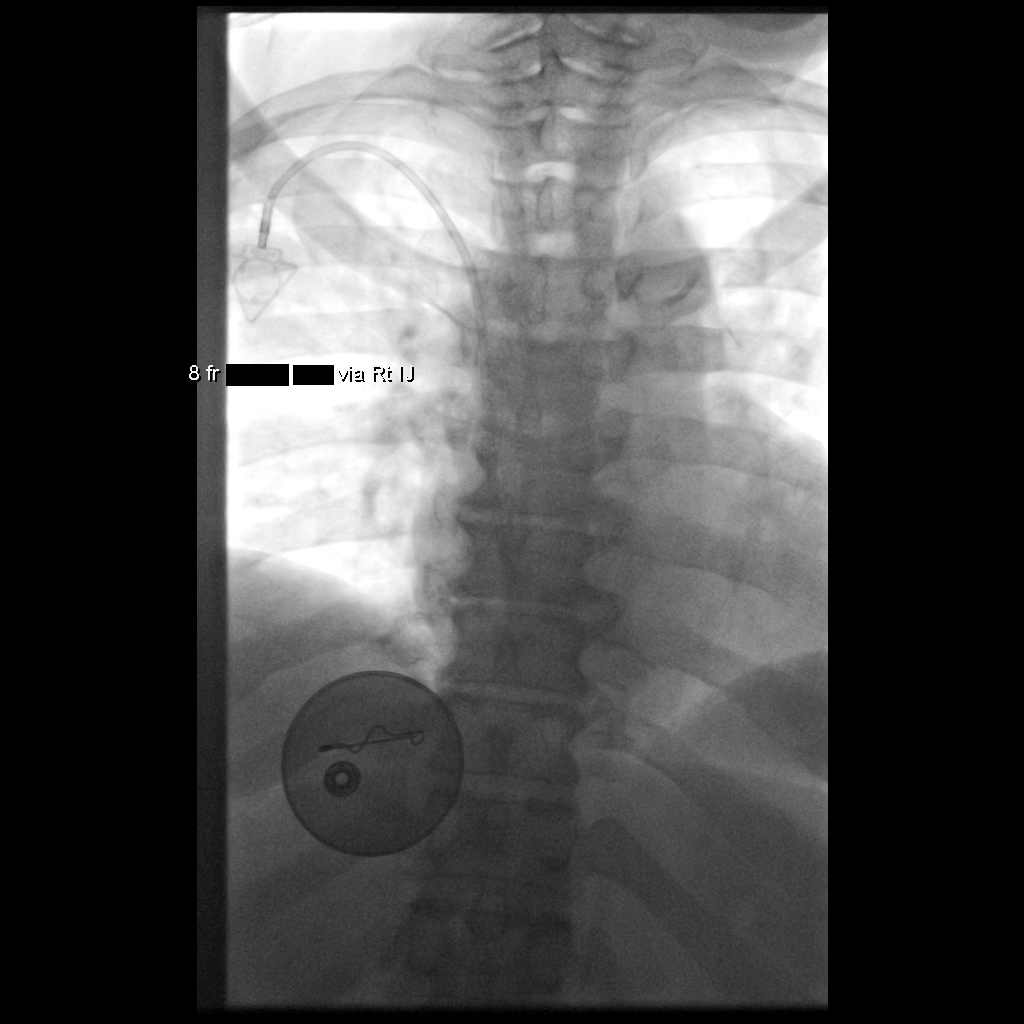

[2 of 2 positions shown; findings below may reference images not displayed]

EXAM:
RIGHT INTERNAL JUGULAR SINGLE LUMEN POWER PORT CATHETER INSERTION

Date:  [DATE] [DATE] [DATE]

Radiologist:  PO

Guidance:  Ultrasound and fluoroscopic

MEDICATIONS:
Ancef 2 g; The antibiotic was administered within an appropriate
time interval prior to skin puncture.

ANESTHESIA/SEDATION:
Versed 2.0 mg IV; Fentanyl 100 mcg IV;

Moderate Sedation Time:  28 minutes

The patient was continuously monitored during the procedure by the
interventional radiology nurse under my direct supervision.

FLUOROSCOPY TIME:  0 minutes, 48 seconds (16 mGy)

COMPLICATIONS:
None immediate.

CONTRAST:  None

PROCEDURE:
Informed consent was obtained from the patient following explanation
of the procedure, risks, benefits and alternatives. The patient
understands, agrees and consents for the procedure. All questions
were addressed. A time out was performed.

Maximal barrier sterile technique utilized including caps, mask,
sterile gowns, sterile gloves, large sterile drape, hand hygiene,
and 2% chlorhexidine scrub.

Under sterile conditions and local anesthesia, right internal
jugular micropuncture venous access was performed. Access was
performed with ultrasound. Images were obtained for documentation of
the patent right internal jugular vein. A guide wire was inserted
followed by a transitional dilator. This allowed insertion of a
guide wire and catheter into the IVC. Measurements were obtained
from the SVC / RA junction back to the right IJ venotomy site. In
the right infraclavicular chest, a subcutaneous pocket was created
over the second anterior rib. This was done under sterile conditions
and local anesthesia. 1% lidocaine with epinephrine was utilized for
this. A 2.5 cm incision was made in the skin. Blunt dissection was
performed to create a subcutaneous pocket over the right pectoralis
major muscle. The pocket was flushed with saline vigorously. There
was adequate hemostasis. The port catheter was assembled and checked
for leakage. The port catheter was secured in the pocket with two
retention sutures. The tubing was tunneled subcutaneously to the
right venotomy site and inserted into the SVC/RA junction through a
valved peel-away sheath. Position was confirmed with fluoroscopy.
Images were obtained for documentation. The patient tolerated the
procedure well. No immediate complications. Incisions were closed in
a two layer fashion with 4 - 0 Vicryl suture. Dermabond was applied
to the skin. The port catheter was accessed, blood was aspirated
followed by saline and heparin flushes. Needle was removed. A dry
sterile dressing was applied.
IMPRESSION: Ultrasound and fluoroscopically guided right internal jugular single
lumen power port catheter insertion. Tip in the SVC/RA junction.
Catheter ready for use.

## 2019-09-24 MED ORDER — CEFAZOLIN SODIUM-DEXTROSE 2-4 GM/100ML-% IV SOLN
INTRAVENOUS | Status: AC
  Administered 2019-09-24: 2 g via INTRAVENOUS
  Filled 2019-09-24: qty 100

## 2019-09-24 MED ORDER — LIDOCAINE-EPINEPHRINE 1 %-1:100000 IJ SOLN
INTRAMUSCULAR | Status: AC
  Filled 2019-09-24: qty 1

## 2019-09-24 MED ORDER — FENTANYL CITRATE (PF) 100 MCG/2ML IJ SOLN
INTRAMUSCULAR | Status: AC | PRN
  Administered 2019-09-24 (×2): 50 ug via INTRAVENOUS

## 2019-09-24 MED ORDER — HEPARIN SOD (PORK) LOCK FLUSH 100 UNIT/ML IV SOLN
INTRAVENOUS | Status: AC | PRN
  Administered 2019-09-24: 500 [IU] via INTRAVENOUS

## 2019-09-24 MED ORDER — LIDOCAINE-EPINEPHRINE 1 %-1:100000 IJ SOLN
INTRAMUSCULAR | Status: AC | PRN
  Administered 2019-09-24 (×2): 10 mL

## 2019-09-24 MED ORDER — HEPARIN SOD (PORK) LOCK FLUSH 100 UNIT/ML IV SOLN
INTRAVENOUS | Status: AC
  Filled 2019-09-24: qty 5

## 2019-09-24 MED ORDER — POTASSIUM CHLORIDE CRYS ER 20 MEQ PO TBCR
40.0000 meq | EXTENDED_RELEASE_TABLET | Freq: Once | ORAL | Status: AC
  Administered 2019-09-24: 40 meq via ORAL
  Filled 2019-09-24: qty 2

## 2019-09-24 MED ORDER — CEFAZOLIN SODIUM-DEXTROSE 2-4 GM/100ML-% IV SOLN: 2.0000 g | INTRAVENOUS | Status: AC

## 2019-09-24 MED ORDER — CHLORHEXIDINE GLUCONATE CLOTH 2 % EX PADS: 6.0000 | MEDICATED_PAD | Freq: Every day | CUTANEOUS | Status: DC

## 2019-09-24 MED ORDER — MIDAZOLAM HCL 2 MG/2ML IJ SOLN
INTRAMUSCULAR | Status: AC | PRN
  Administered 2019-09-24 (×2): 1 mg via INTRAVENOUS

## 2019-09-24 MED ORDER — MIDAZOLAM HCL 2 MG/2ML IJ SOLN
INTRAMUSCULAR | Status: AC
  Filled 2019-09-24: qty 4

## 2019-09-24 MED ORDER — SODIUM CHLORIDE 0.9 % IV SOLN
INTRAVENOUS | Status: DC
  Administered 2019-09-24: 20 mL/h via INTRAVENOUS

## 2019-09-24 MED ORDER — FENTANYL CITRATE (PF) 100 MCG/2ML IJ SOLN
INTRAMUSCULAR | Status: AC
  Filled 2019-09-24: qty 2

## 2019-09-24 NOTE — Consult Note (Addendum)
[place]  [phone] Fax:(336) [phone]   MEDICAL ONCOLOGY - INITIAL CONSULTATION  Referral MD: Dr. Cordelia Poche  Reason for Referral: Pancreatic adenocarcinoma  HPI: Ms. Boskovich is a 64 year old female with a past medical history significant for hypertension, diabetes mellitus type 2, obesity, hepatitis C status post treatment, cirrhosis, history of alcohol use.  The patient was sent to the emergency room by her physician for abnormal liver function test.  She had been having dark urine for 3 weeks prior to admission. On admission, her albumin was 2.2, AST 236, ALT 138, alkaline phosphatase 450, total bilirubin 21.5.  CBC showed an elevated white blood cell count at 11.0 and mild anemia with a hemoglobin of 10.8.  Abdominal ultrasound performed on 09/20/2019 showed moderate amount of gallbladder sludge, dilatation of the common bile duct which had progressed since the prior ultrasound with interval development of mild intrahepatic biliary ductal dilatation, probable fatty liver or changes of cirrhosis.  She had an MRCP performed on 09/20/2019 which showed signs of a pancreatic/ampullary mass with extension into the duodenum and obstruction of the biliary and pancreatic ducts.  She underwent ERCP with stent placement on 09/22/2019.  Ampullary biopsy showed duodenal mucosa with reactive changes.  Stomach biopsy showed mild chronic gastritis and negative for H. pylori.  Biopsy of the gastrohepatic lymph node showed lymphoid tissue.  Brushing of the common bile duct showed atypical cells.  Biopsy of the pancreatic head showed malignant cells consistent with adenocarcinoma.  She had a CT of the chest on 09/23/2019 to complete her staging work-up which showed no evidence of metastatic disease.  The patient reports that she is feeling well today.  She has not had any fevers or chills.  She denies anorexia but has lost about 10 to 15 pounds recently.  Has noticed that her eyes appear  yellow.  She denies headaches and dizziness.  Denies chest discomfort, shortness of breath, cough.  Nuys abdominal discomfort, nausea, vomiting, constipation, diarrhea.  She has not noticed any bleeding.  The patient is married and has a son named Roselyn Reef who lives locally.  The patient reports that she quit drinking alcohol about 3 months ago.  When asked how much she used to drink she indicated "a lot."  She quit smoking cigarettes in December 2020.  She could not quantify how much she used to smoke.  Medical oncology was asked see the patient to make recommendations regarding her newly diagnosed pancreatic adenocarcinoma.   Past Medical History:  Diagnosis Date  . Cirrhosis ([place])   . Diabetes mellitus 2012  . Diverticulosis   . Gallbladder sludge   . Hepatitis 2010   history of Hepatitis C  . Hepatitis C   . Hypertension 2011  . Obesity   :  Past Surgical History:  Procedure Laterality Date  . BILIARY BRUSHING  09/22/2019   Procedure: BILIARY BRUSHING;  Surgeon: Rush Landmark Telford Nab., MD;  Location: [facility];  Service: Gastroenterology;;  . BILIARY DILATION  09/22/2019   Procedure: BILIARY DILATION;  Surgeon: Irving Copas., MD;  Location: [facility];  Service: Gastroenterology;;  . BILIARY STENT PLACEMENT N/A 09/22/2019   Procedure: BILIARY STENT PLACEMENT;  Surgeon: Irving Copas., MD;  Location: [facility];  Service: Gastroenterology;  Laterality: N/A;  . COLONOSCOPY  07/24/2011   Procedure: COLONOSCOPY;  Surgeon: Landry Dyke, MD;  Location: [facility];  Service: Endoscopy;  Laterality: N/A;  . Dental Surgery Tooth Extraction    . ERCP N/A 09/22/2019  Procedure: ENDOSCOPIC RETROGRADE CHOLANGIOPANCREATOGRAPHY (ERCP);  Surgeon: Irving Copas., MD;  Location: [facility];  Service: Gastroenterology;  Laterality: N/A;  . ESOPHAGOGASTRODUODENOSCOPY (EGD) WITH PROPOFOL N/A 09/22/2019   Procedure: ESOPHAGOGASTRODUODENOSCOPY (EGD) WITH PROPOFOL;  Surgeon:  Rush Landmark Telford Nab., MD;  Location: [facility];  Service: Gastroenterology;  Laterality: N/A;  . EUS N/A 09/22/2019   Procedure: UPPER ENDOSCOPIC ULTRASOUND (EUS) RADIAL;  Surgeon: Rush Landmark Telford Nab., MD;  Location: [facility];  Service: Gastroenterology;  Laterality: N/A;  . FINE NEEDLE ASPIRATION  09/22/2019   Procedure: FINE NEEDLE ASPIRATION (FNA) LINEAR;  Surgeon: Irving Copas., MD;  Location: [facility];  Service: Gastroenterology;;  . Joan Mayans  09/22/2019   Procedure: Joan Mayans;  Surgeon: Irving Copas., MD;  Location: [facility];  Service: Gastroenterology;;  :  Current Facility-Administered Medications  Medication Dose Route Frequency Provider Last Rate Last Admin  . amLODipine (NORVASC) tablet 10 mg  10 mg Oral QHS Jani Gravel, MD   10 mg at 09/23/19 2030  . feeding supplement (BOOST / RESOURCE BREEZE) liquid 1 Container  1 Container Oral TID BM Antonieta Pert, MD   1 Container at 09/23/19 2032  . indomethacin (INDOCIN) 50 MG suppository 100 mg  100 mg Rectal Once Noralyn Pick, NP      . insulin aspart (novoLOG) injection 0-9 Units  0-9 Units Subcutaneous Q4H Jani Gravel, MD   1 Units at 09/24/19 [phone]  . irbesartan (AVAPRO) tablet 37.5 mg  37.5 mg Oral Daily Jani Gravel, MD   37.5 mg at 09/23/19 0939  . metoprolol succinate (TOPROL-XL) 24 hr tablet 25 mg  25 mg Oral Daily Jani Gravel, MD   25 mg at 09/23/19 0939  . traMADol (ULTRAM) tablet 50 mg  50 mg Oral Q6H PRN Gardiner Barefoot, NP   50 mg at 09/24/19 [id number]     Allergies  Allergen Reactions  . Lisinopril Swelling    Facial and upper lip  :  Family History  Problem Relation Age of Onset  . Diabetes Sister   . Cancer Maternal Grandmother        leukemia   . Diabetes Maternal Grandfather   . Diabetes Paternal Grandfather   . Diabetes Paternal Grandmother   . Hypertension Sister   . Hypertension Brother   . Anesthesia problems Neg Hx   :  Social History    Socioeconomic History  . Marital status: Married    Spouse name: Not on file  . Number of children: Not on file  . Years of education: Not on file  . Highest education level: Not on file  Occupational History  . Not on file  Tobacco Use  . Smoking status: Former Smoker    Types: Cigarettes    Quit date: 07/20/2019    Years since quitting: 0.1  . Smokeless tobacco: Never Used  Substance and Sexual Activity  . Alcohol use: Not Currently    Alcohol/week: 30.0 standard drinks    Types: 24 Cans of beer, 6 Shots of liquor per week    Comment: weekends  . Drug use: No    Comment: previously   . Sexual activity: Yes    Partners: Male    Birth control/protection: Post-menopausal  Other Topics Concern  . Not on file  Social History Narrative   Previous [facility] pt.  Last MD was Dr. Delman Cheadle, seen 12/2011      Works part time in [employer] with husband   Social  Determinants of Health   Financial Resource Strain:   . Difficulty of Paying Living Expenses: Not on file  Food Insecurity:   . Worried About Charity fundraiser in the Last Year: Not on file  . Ran Out of Food in the Last Year: Not on file  Transportation Needs:   . Lack of Transportation (Medical): Not on file  . Lack of Transportation (Non-Medical): Not on file  Physical Activity:   . Days of Exercise per Week: Not on file  . Minutes of Exercise per Session: Not on file  Stress:   . Feeling of Stress : Not on file  Social Connections:   . Frequency of Communication with Friends and Family: Not on file  . Frequency of Social Gatherings with Friends and Family: Not on file  . Attends Religious Services: Not on file  . Active Member of Clubs or Organizations: Not on file  . Attends Archivist Meetings: Not on file  . Marital Status: Not on file  Intimate Partner Violence:   . Fear of Current or Ex-Partner: Not on file  . Emotionally Abused: Not on file  . Physically  Abused: Not on file  . Sexually Abused: Not on file  :  Review of Systems: A comprehensive 14 point review of systems was negative except as noted in the HPI.  Exam: Patient Vitals for the past 24 hrs:  BP Temp Temp src Pulse Resp SpO2  09/24/19 0812 (!) 143/82 -- -- 73 18 100 %  09/24/19 0549 (!) 138/99 98.1 F (36.7 C) Oral 74 18 97 %  09/23/19 2028 (!) 152/87 98 F (36.7 C) Oral 74 16 99 %  09/23/19 1451 (!) 150/78 98.5 F (36.9 C) Oral 68 18 98 %    General:  well-nourished in no acute distress.   Eyes: Scleral icterus noted.   ENT:  There were no oropharyngeal lesions.   Neck was without thyromegaly.   Lymphatics:  Negative cervical, supraclavicular or axillary adenopathy.   Respiratory: lungs were clear bilaterally without wheezing or crackles.   Cardiovascular:  Regular rate and rhythm, S1/S2, without murmur, rub or gallop.  There was no pedal edema.   GI:  abdomen was soft, flat, nontender, nondistended, without organomegaly.   Musculoskeletal:  no spinal tenderness of palpation of vertebral spine.   Skin exam was without echymosis, petichae.   Neuro exam was nonfocal. Patient was alert and oriented.  Attention was good.   Language was appropriate.  Mood was normal without depression.  Speech was not pressured.  Thought content was not tangential.     Lab Results  Component Value Date   WBC 10.0 09/23/2019   HGB 10.5 (L) 09/23/2019   HCT 32.2 (L) 09/23/2019   PLT 412 (H) 09/23/2019   GLUCOSE 146 (H) 09/24/2019   CHOL 147 05/25/2019   TRIG 156.0 (H) 05/25/2019   HDL 49.00 05/25/2019   LDLDIRECT 110 (H) 09/30/2012   LDLCALC 67 05/25/2019   ALT 111 (H) 09/24/2019   AST 138 (H) 09/24/2019   NA 133 (L) 09/24/2019   K 3.4 (L) 09/24/2019   CL 100 09/24/2019   CREATININE 0.56 09/24/2019   BUN 19 09/24/2019   CO2 26 09/24/2019    CT CHEST W CONTRAST  Result Date: 09/23/2019 CLINICAL DATA:  64 year old female with pancreatic cancer staging. EXAM: CT CHEST WITH  CONTRAST TECHNIQUE: Multidetector CT imaging of the chest was performed during intravenous contrast administration. CONTRAST:  41mL OMNIPAQUE IOHEXOL 300 MG/ML  SOLN COMPARISON:  Abdominal MRI dated 09/20/2019 and ultrasound dated 09/20/2019 FINDINGS: Cardiovascular: There is no cardiomegaly or pericardial effusion. Coronary vascular calcifications primarily involving the RCA and left circumflex artery. There is mild atherosclerotic calcification of the thoracic aorta. No aneurysmal dilatation or dissection. The origins of the great vessels of the aortic arch appear patent as visualized. The central pulmonary arteries are patent. Mediastinum/Nodes: There is no hilar or mediastinal adenopathy. Mildly rounded and enlarged lymph node in the right cardiophrenic angle (series 2, image 97) measures 11 mm in diameter. The esophagus and the thyroid gland are grossly unremarkable. No mediastinal fluid collection. Lungs/Pleura: The lungs are clear. There is no pleural effusion or pneumothorax. The central airways are patent. Upper Abdomen: Pneumobilia and partially visualized biliary stent. There is a 3.2 x 1.7 cm low attenuating lesion in the body of the pancreas. Several mildly enlarged gastrohepatic lymph nodes including a 12 mm mildly rounded lymph node (series 2, image 126). Musculoskeletal: Mild degenerative changes of the spine. No acute osseous pathology. IMPRESSION: 1. No acute intrathoracic pathology. No CT evidence of intrathoracic metastatic disease. A single mildly enlarged and rounded lymph node at the right cardiophrenic angle measures 11 mm in diameter. 2. Partially visualized CBD stent and associated pneumobilia. 3. A low attenuating mass in the distal body of the pancreas and several mildly enlarged gastrohepatic lymph nodes. Electronically Signed   By: Anner Crete M.D.   On: 09/23/2019 19:31   DG ERCP BILIARY & PANCREATIC DUCTS  Result Date: 09/22/2019 CLINICAL DATA:  64 year old female with a  history of jaundice EXAM: ERCP TECHNIQUE: Multiple spot images obtained with the fluoroscopic device and submitted for interpretation post-procedure. FLUOROSCOPY TIME:  Fluoroscopy Time:  4 minutes 24 seconds COMPARISON:  None. FINDINGS: Limited intraoperative fluoroscopic spot images during ERCP. Initial image demonstrates endoscope projecting over the upper abdomen. Vague calcifications/densities of the upper abdomen may reflect calcifications of the pancreatic head. Subsequently there is cannulation of the ampulla and retrograde infusion of contrast with partial opacification of the extrahepatic biliary ductal system. There is then placement of a metallic biliary stent and PTA biliary stricture. IMPRESSION: Limited images of ERCP demonstrates treatment of biliary stricture with stenting and PTA. Please refer to the dictated operative report for full details of intraoperative findings and procedure. Electronically Signed   By: Corrie Mckusick D.O.   On: 09/22/2019 16:26   MR ABDOMEN MRCP W WO CONTAST  Result Date: 09/20/2019 CLINICAL DATA:  Jaundice. EXAM: MRI ABDOMEN WITHOUT AND WITH CONTRAST (INCLUDING MRCP) TECHNIQUE: Multiplanar multisequence MR imaging of the abdomen was performed both before and after the administration of intravenous contrast. Heavily T2-weighted images of the biliary and pancreatic ducts were obtained, and three-dimensional MRCP images were rendered by post processing. CONTRAST:  69mL GADAVIST GADOBUTROL 1 MMOL/ML IV SOLN COMPARISON:  Abdominal sonogram of 09/20/2019 FINDINGS: Lower chest: Limited assessment of the lower chest shows no effusion or consolidation. Study limited by patient body habitus overall. 13 mm pre pericardial lymph node is nonspecific. There is also a suggestion of irregularity of soft tissues in the left left supraclavicular region. This could represent nodal disease given findings below. Hepatobiliary: Signs of irregular filling defect in the duodenum, associated  with biliary and pancreatic ductal dilation and pancreatic atrophy. Common bile duct is largest 15 mm with marked distension of the intrahepatic biliary tree. Common bile duct with tapered narrowing to the level of the ampulla. Sludge in the dependent gallbladder and small stones. Also likely with sludge in the  common bile duct and dependent low signal intensity on reconstructed MRCP sequences that suggests either small stones or biliary sludge in the dependent portion of the distal common bile duct. No visible intrahepatic calculi. Pancreas: Marked dilation of dorsal pancreatic duct with signs of pancreatic atrophy in intraductal calculi suggested on T2 and MRCP sequences some as large is a cm and mainly present near the ductal confluence. Ampullary filling defect as outlined above. Ductal caliber in maximum axial dimension 14 mm of the pancreatic duct. In addition to extension into the ampulla there is abnormal soft tissue involving the pancreatic head best seen on subtraction images, image 74 of postcontrast data measuring approximately 4.4 x 3.2 cm in greatest axial dimension including extension into the lumen of the duodenum. Spleen:  Spleen is normal size without suspicious lesion. Adrenals/Urinary Tract: Adrenal glands are normal. Small part solid and enhancing renal lesion arises from the lower pole of the right kidney measuring 11 x 9 mm. No signs of hydronephrosis. Stomach/Bowel: No sign of bowel obstruction. Masslike area in the region of the ampulla filling the ampullary portion of the duodenum may measure as large as 3.5 x 3.1 cm in greatest axial dimension and results in ductal obstruction of both pancreatic and biliary duct. Lobular contour of the pancreatic tail with heterogeneity, question of small lesion in this location as well 1.3 cm (image 49, venous phase data set, postcontrast images. Vascular/Lymphatic: Bulky periportal lymph nodes suspicious for neoplastic/metastatic lymph nodes based on  location in the appearance of the duodenum. Also with enlarged hepatic gastric lymph nodes. (Image 59, post-contrast images) 1.4 cm portacaval lymph node (Image 46, post-contrast images 9 mm hepatic gastric lymph node. Other: Portal vein and SMV are patent. SMA is patent. No signs of soft tissue in case mint of vascular structures in the upper abdomen. Musculoskeletal: No suspicious bone lesions identified. IMPRESSION: 1. Signs of pancreatic/ampullary mass with extension into duodenum and obstruction of biliary and pancreatic ducts. 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 Endoscopic assessment is suggested. 2. Signs of pancreatic atrophy with lobular area in the pancreatic tail the raise the question of second small lesion versus is heterogeneous pancreas with signs of prior inflammation. 3. Biliary and pancreatic ductal obstruction with sludge and/or small stones layering dependently in the common bile duct and intraductal calculi suspected in the dilated pancreatic duct near the biliary ductal and pancreatic ductal confluence. Some calculi may be as large as 1 cm. 4. Signs of nodal enlargement in the upper abdomen and even in lower chest raising the question of nodal involvement. 5. Query left supraclavicular nodal enlargement, chest imaging may be helpful given findings above. 6. Small solid and enhancing lesion in the lower pole of the right kidney. Recommend attention on follow-up imaging. This recommendation follows ACR consensus guidelines: White Paper of the ACR Incidental Findings Committee II on Vascular Findings. J Am Coll Radiol 2013; 10:789-794. 7. These results will be called to the ordering clinician or representative by the Radiologist Assistant, and communication documented in the PACS or zVision Dashboard. Electronically Signed   By: Zetta Bills M.D.   On: 09/20/2019 22:10   US Abdomen Limited RUQ  Result Date: 09/20/2019 CLINICAL DATA:   64 year old female with elevated bilirubin. EXAM: ULTRASOUND ABDOMEN LIMITED RIGHT UPPER QUADRANT COMPARISON:  Ultrasound dated 08/19/2019. FINDINGS: Gallbladder: There is sludge within the gallbladder. No definite shadowing stone, gallbladder wall thickening, or pericholecystic fluid. Negative sonographic Murphy's sign. Common bile duct: Diameter: 16 mm. Interval increase in  the size of the common bile duct as well as development of mild intrahepatic biliary ductal dilatation since the prior ultrasound. Correlation with clinical exam, and LFTs recommended. MRCP may provide better evaluation. Liver: There is heterogeneously increased liver echogenicity most commonly seen in the setting of fatty infiltration. Superimposed inflammation or fibrosis is not excluded. Clinical correlation is recommended. Portal vein is patent on color Doppler imaging with normal direction of blood flow towards the liver. Other: None. IMPRESSION: 1. Moderate amount of gallbladder sludge. No sonographic findings of acute cholecystitis. 2. Dilatation of the common bile duct, progressed since the prior ultrasound with interval development of mild intrahepatic biliary ductal dilatation. Clinical correlation is recommended. MRCP may provide better evaluation. 3. Probable fatty liver or changes of cirrhosis. Electronically Signed   By: Anner Crete M.D.   On: 09/20/2019 18:38     CT CHEST W CONTRAST  Result Date: 09/23/2019 CLINICAL DATA:  64 year old female with pancreatic cancer staging. EXAM: CT CHEST WITH CONTRAST TECHNIQUE: Multidetector CT imaging of the chest was performed during intravenous contrast administration. CONTRAST:  33mL OMNIPAQUE IOHEXOL 300 MG/ML  SOLN COMPARISON:  Abdominal MRI dated 09/20/2019 and ultrasound dated 09/20/2019 FINDINGS: Cardiovascular: There is no cardiomegaly or pericardial effusion. Coronary vascular calcifications primarily involving the RCA and left circumflex artery. There is mild  atherosclerotic calcification of the thoracic aorta. No aneurysmal dilatation or dissection. The origins of the great vessels of the aortic arch appear patent as visualized. The central pulmonary arteries are patent. Mediastinum/Nodes: There is no hilar or mediastinal adenopathy. Mildly rounded and enlarged lymph node in the right cardiophrenic angle (series 2, image 97) measures 11 mm in diameter. The esophagus and the thyroid gland are grossly unremarkable. No mediastinal fluid collection. Lungs/Pleura: The lungs are clear. There is no pleural effusion or pneumothorax. The central airways are patent. Upper Abdomen: Pneumobilia and partially visualized biliary stent. There is a 3.2 x 1.7 cm low attenuating lesion in the body of the pancreas. Several mildly enlarged gastrohepatic lymph nodes including a 12 mm mildly rounded lymph node (series 2, image 126). Musculoskeletal: Mild degenerative changes of the spine. No acute osseous pathology. IMPRESSION: 1. No acute intrathoracic pathology. No CT evidence of intrathoracic metastatic disease. A single mildly enlarged and rounded lymph node at the right cardiophrenic angle measures 11 mm in diameter. 2. Partially visualized CBD stent and associated pneumobilia. 3. A low attenuating mass in the distal body of the pancreas and several mildly enlarged gastrohepatic lymph nodes. Electronically Signed   By: Anner Crete M.D.   On: 09/23/2019 19:31   DG ERCP BILIARY & PANCREATIC DUCTS  Result Date: 09/22/2019 CLINICAL DATA:  64 year old female with a history of jaundice EXAM: ERCP TECHNIQUE: Multiple spot images obtained with the fluoroscopic device and submitted for interpretation post-procedure. FLUOROSCOPY TIME:  Fluoroscopy Time:  4 minutes 24 seconds COMPARISON:  None. FINDINGS: Limited intraoperative fluoroscopic spot images during ERCP. Initial image demonstrates endoscope projecting over the upper abdomen. Vague calcifications/densities of the upper abdomen may  reflect calcifications of the pancreatic head. Subsequently there is cannulation of the ampulla and retrograde infusion of contrast with partial opacification of the extrahepatic biliary ductal system. There is then placement of a metallic biliary stent and PTA biliary stricture. IMPRESSION: Limited images of ERCP demonstrates treatment of biliary stricture with stenting and PTA. Please refer to the dictated operative report for full details of intraoperative findings and procedure. Electronically Signed   By: Corrie Mckusick D.O.   On: 09/22/2019 16:26  MR ABDOMEN MRCP W WO CONTAST  Result Date: 09/20/2019 CLINICAL DATA:  Jaundice. EXAM: MRI ABDOMEN WITHOUT AND WITH CONTRAST (INCLUDING MRCP) TECHNIQUE: Multiplanar multisequence MR imaging of the abdomen was performed both before and after the administration of intravenous contrast. Heavily T2-weighted images of the biliary and pancreatic ducts were obtained, and three-dimensional MRCP images were rendered by post processing. CONTRAST:  16mL GADAVIST GADOBUTROL 1 MMOL/ML IV SOLN COMPARISON:  Abdominal sonogram of 09/20/2019 FINDINGS: Lower chest: Limited assessment of the lower chest shows no effusion or consolidation. Study limited by patient body habitus overall. 13 mm pre pericardial lymph node is nonspecific. There is also a suggestion of irregularity of soft tissues in the left left supraclavicular region. This could represent nodal disease given findings below. Hepatobiliary: Signs of irregular filling defect in the duodenum, associated with biliary and pancreatic ductal dilation and pancreatic atrophy. Common bile duct is largest 15 mm with marked distension of the intrahepatic biliary tree. Common bile duct with tapered narrowing to the level of the ampulla. Sludge in the dependent gallbladder and small stones. Also likely with sludge in the common bile duct and dependent low signal intensity on reconstructed MRCP sequences that suggests either small  stones or biliary sludge in the dependent portion of the distal common bile duct. No visible intrahepatic calculi. Pancreas: Marked dilation of dorsal pancreatic duct with signs of pancreatic atrophy in intraductal calculi suggested on T2 and MRCP sequences some as large is a cm and mainly present near the ductal confluence. Ampullary filling defect as outlined above. Ductal caliber in maximum axial dimension 14 mm of the pancreatic duct. In addition to extension into the ampulla there is abnormal soft tissue involving the pancreatic head best seen on subtraction images, image 74 of postcontrast data measuring approximately 4.4 x 3.2 cm in greatest axial dimension including extension into the lumen of the duodenum. Spleen:  Spleen is normal size without suspicious lesion. Adrenals/Urinary Tract: Adrenal glands are normal. Small part solid and enhancing renal lesion arises from the lower pole of the right kidney measuring 11 x 9 mm. No signs of hydronephrosis. Stomach/Bowel: No sign of bowel obstruction. Masslike area in the region of the ampulla filling the ampullary portion of the duodenum may measure as large as 3.5 x 3.1 cm in greatest axial dimension and results in ductal obstruction of both pancreatic and biliary duct. Lobular contour of the pancreatic tail with heterogeneity, question of small lesion in this location as well 1.3 cm (image 49, venous phase data set, postcontrast images. Vascular/Lymphatic: Bulky periportal lymph nodes suspicious for neoplastic/metastatic lymph nodes based on location in the appearance of the duodenum. Also with enlarged hepatic gastric lymph nodes. (Image 59, post-contrast images) 1.4 cm portacaval lymph node (Image 46, post-contrast images 9 mm hepatic gastric lymph node. Other: Portal vein and SMV are patent. SMA is patent. No signs of soft tissue in case mint of vascular structures in the upper abdomen. Musculoskeletal: No suspicious bone lesions identified. IMPRESSION: 1.  Signs of pancreatic/ampullary mass with extension into duodenum and obstruction of biliary and pancreatic ducts. 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 Endoscopic assessment is suggested. 2. Signs of pancreatic atrophy with lobular area in the pancreatic tail the raise the question of second small lesion versus is heterogeneous pancreas with signs of prior inflammation. 3. Biliary and pancreatic ductal obstruction with sludge and/or small stones layering dependently in the common bile duct and intraductal calculi suspected in the dilated pancreatic duct near the biliary ductal  and pancreatic ductal confluence. Some calculi may be as large as 1 cm. 4. Signs of nodal enlargement in the upper abdomen and even in lower chest raising the question of nodal involvement. 5. Query left supraclavicular nodal enlargement, chest imaging may be helpful given findings above. 6. Small solid and enhancing lesion in the lower pole of the right kidney. Recommend attention on follow-up imaging. This recommendation follows ACR consensus guidelines: White Paper of the ACR Incidental Findings Committee II on Vascular Findings. J Am Coll Radiol 2013; 10:789-794. 7. These results will be called to the ordering clinician or representative by the Radiologist Assistant, and communication documented in the PACS or zVision Dashboard. Electronically Signed   By: Zetta Bills M.D.   On: 09/20/2019 22:10   US Abdomen Limited RUQ  Result Date: 09/20/2019 CLINICAL DATA:  64 year old female with elevated bilirubin. EXAM: ULTRASOUND ABDOMEN LIMITED RIGHT UPPER QUADRANT COMPARISON:  Ultrasound dated 08/19/2019. FINDINGS: Gallbladder: There is sludge within the gallbladder. No definite shadowing stone, gallbladder wall thickening, or pericholecystic fluid. Negative sonographic Murphy's sign. Common bile duct: Diameter: 16 mm. Interval increase in the size of the common bile duct as well as development  of mild intrahepatic biliary ductal dilatation since the prior ultrasound. Correlation with clinical exam, and LFTs recommended. MRCP may provide better evaluation. Liver: There is heterogeneously increased liver echogenicity most commonly seen in the setting of fatty infiltration. Superimposed inflammation or fibrosis is not excluded. Clinical correlation is recommended. Portal vein is patent on color Doppler imaging with normal direction of blood flow towards the liver. Other: None. IMPRESSION: 1. Moderate amount of gallbladder sludge. No sonographic findings of acute cholecystitis. 2. Dilatation of the common bile duct, progressed since the prior ultrasound with interval development of mild intrahepatic biliary ductal dilatation. Clinical correlation is recommended. MRCP may provide better evaluation. 3. Probable fatty liver or changes of cirrhosis. Electronically Signed   By: Anner Crete M.D.   On: 09/20/2019 18:38    Pathology:  CYTOLOGY - NON PAP  CASE: [id number]  PATIENT: Ernst Breach  Non-Gynecological Cytology Report   Clinical History: Head of pancreas mass; biliary and pancreatic duct  obstruction; grade 1 esophageal varices  Specimen Submitted: A. PANCREAS, HEAD, FINE NEEDLE ASPIRATION:    FINAL MICROSCOPIC DIAGNOSIS:  - Malignant cells consistent with adenocarcinoma  - See comment   SPECIMEN ADEQUACY:  Satisfactory for evaluation   IMMEDIATE EVALUATION:  MALIGNANT CELLS PRESENT (DLB)   DIAGNOSTIC COMMENTS:  Dr. Jeannie Done reviewed the case and agrees with the above diagnosis.   Assessment and Plan:  1.  Locally advanced pancreatic adenocarcinoma 2.  Elevated LFTs and total bilirubin, improving 3.  Chronic hepatitis C status post treatment with Harvoni with cirrhosis 4.  Normocytic anemia 5.  Diabetes mellitus type 2  -The patient has locally advanced pancreatic adenocarcinoma.  Diagnosis discussed by Dr. Marin Olp earlier today.  He recommends systemic  chemotherapy as an outpatient.  We will request Port-A-Cath placement by IR.  I have spoken with the hospitalist and IR who can tentatively perform this procedure later today.  I have also spoken with GI who plans for EUS tomorrow for additional tissue. -Status post stent placement with improvement of her LFTs and total bilirubin.  Will closely monitor. -The patient has mild normocytic anemia likely due to her underlying malignancy.  This is overall stable and no need for transfusion.  We will monitor this closely as an outpatient.  We will arrange for outpatient follow-up at [facility] at the  cancer center after discharge to begin treatment.  Thank you for this referral.   Mikey Bussing, DNP, AGPCNP-BC, AOCNP  ADDENDUM: I saw and examined Ms. Roker this morning.  I agree with the above assessment.  She is very nice.  She has a strong faith.  We had a good prayer session.  She has locally advanced, at least, pancreatic cancer.  She is not operable from my perspective.  The one thing I am puzzled by is the fact that her CA 19-9 is only 2.  This is quite unusual.  I have never seen a CA 9-9 this low in a patient with locally advanced pancreatic cancer.  This appears to be adenocarcinoma.  We must get molecular markers so that we will know how to best treat Ms. Sandquist.  Nowadays, for pancreatic cancers that have specific genetic mutations, we can actually use targeted therapy and not have to use chemotherapy.  I do think that she will need to have another endoscopic ultrasound to see if we can get additional tissue for [place] to send off.  I talked to her a little bit about the prognosis.  At this time, she really does not want to hear about this.  I cannot think of any additional studies that we have to do.  I do not think a PET scan really is helpful.  The MRI was very great idea for evaluate her initially.  We really have to try to get her bilirubin down before we can consider  any therapy.  Today, her bilirubin was down to 8.7.  This is trending downward very nicely.  I spent about 45 minutes with her.  Again, she has a strong faith.  She does seem to be in good health.  I know that she has had hepatitis C in the past but there is no detectable hepatitis C virus.  As far systemic chemotherapy is concerned, I would like to try to use FOLFIRINOX given that she is relatively young and does seem to have a good performance status.  We will certainly follow her along.  I appreciate the opportunity to have seen Ms. Brock.  Again, her faith is very strong and we will definitely be able to utilize that faith.

## 2019-09-24 NOTE — Anesthesia Preprocedure Evaluation (Addendum)
Anesthesia Evaluation  Patient identified by MRN, date of birth, ID band Patient awake    Reviewed: Allergy & Precautions, NPO status , Patient's Chart, lab work & pertinent test results, reviewed documented beta blocker date and time   History of Anesthesia Complications Negative for: history of anesthetic complications  Airway Mallampati: II  TM Distance: >3 FB Neck ROM: Full    Dental  (+) Missing,    Pulmonary former smoker,    Pulmonary exam normal        Cardiovascular hypertension, Pt. on medications and Pt. on home beta blockers Normal cardiovascular exam     Neuro/Psych negative neurological ROS  negative psych ROS   GI/Hepatic negative GI ROS, (+) Cirrhosis       , Hepatitis -, C  Endo/Other  diabetes, Type 2, Oral Hypoglycemic AgentsMorbid obesity  Renal/GU negative Renal ROS  negative genitourinary   Musculoskeletal negative musculoskeletal ROS (+)   Abdominal   Peds  Hematology  (+) anemia , Hgb 10.5   Anesthesia Other Findings Day of surgery medications reviewed with patient.  Reproductive/Obstetrics negative OB ROS                            Anesthesia Physical Anesthesia Plan  ASA: II  Anesthesia Plan: MAC   Post-op Pain Management:    Induction:   PONV Risk Score and Plan: 2 and Propofol infusion, Treatment may vary due to age or medical condition and Ondansetron  Airway Management Planned: Natural Airway and Nasal Cannula  Additional Equipment: None  Intra-op Plan:   Post-operative Plan:   Informed Consent: I have reviewed the patients History and Physical, chart, labs and discussed the procedure including the risks, benefits and alternatives for the proposed anesthesia with the patient or authorized representative who has indicated his/her understanding and acceptance.       Plan Discussed with: CRNA  Anesthesia Plan Comments:          Anesthesia Quick Evaluation

## 2019-09-24 NOTE — Progress Notes (Signed)
Writer went in the patient room, pt is very upset, stated she wants to take some rest and lot of people come in and checking on her. Writer calmly stated she is in the hospital and the staffs are making sure that she is doing okay. Pt verbalized understanding.

## 2019-09-24 NOTE — Procedures (Signed)
panc ca  S/p RT IJ POWER PORT  TIP SVCRA No comp Stable ebl min Ready for use Full report in pacs

## 2019-09-24 NOTE — Telephone Encounter (Signed)
10/19/19 at 250 pm appt made with Dr Rush Landmark appt with Amy cancelled and labs in [place] for 2 weeks.  Pt notified via My Chart.  Tried to reach pt by mail no answer no voice mail pt currently admitted.

## 2019-09-24 NOTE — Telephone Encounter (Signed)
-----   Message from Irving Copas., MD sent at 09/23/2019  8:12 PM EST ----- Regarding: Follow up Melanie Alvarez,This patient needs a CMP and INR in 2 weeks.Follow-up in clinic in 4 weeks.Please cancel the upcoming clinic visit with Amy.Thanks.GM

## 2019-09-24 NOTE — Progress Notes (Signed)
Pt returned from IR, has port a cath in right chest, dressing is intact, no c/o pain. VSS. No c/o N/V. Monitoring closely.

## 2019-09-24 NOTE — Progress Notes (Signed)
PROGRESS NOTE    Melanie Alvarez  GDJ:[id number] DOB: 1955-12-10 DOA: 09/20/2019 PCP: Flossie Buffy, NP    Brief Narrative: [name] is a 64 y.o. female with significant medical history of diabetes mellitus type 2, hypertension, obesity, hepatitis C status post treatment, cirrhosis from her alcohol abuse. Patient presented secondary to abnormal liver labs with concern for malignant pancreatic mass. GI consulted and EUS/ERCP performed on 2/3.   Assessment & Plan:   Principal Problem:   Abnormal liver function Active Problems:   DM (diabetes mellitus) (HCC)   Hypokalemia   Hyponatremia   Pancreatic mass   Pancreatic adenocarcinoma Confirmed malignancy on pathology from biopsies obtained from recent ERCP/EUS performed 2/3. Oncology consulted and are recommending repeat EUS for more tissue samples in addition to Port-A-Cath placement for eventual outpatient chemotherapy. -GI recommendations: Will perform EUS on 2/6; outpatient follow-up -Oncology recommendations: Repeat EUS, Port-A-Cath  Chronic hepatitis C History of cirrhosis. S/p treatment with Harvoni.  Hypokalemia Treated with supplementation  CBD dilation Secondary to mass effect. Stent placed as mentioned above. Bilirubin trended down  Erosive gastropathy H. Pylori negative on biopsy.  UTI Mentioned in prior notes. Urinalysis does not suggest UTI. No urine culture obtained. Zosyn discontinued.   Hyponatremia Mild. Improvement with IV fluids  Normocytic anemia Unknown etiology but possibly related to malignancy. Stable.  Essential hypertension Not well controlled. Patient is on metoprolol, amlodipine as an outpatient -Continue amlodipine and metoprolol  Diabetes mellitus, type 2 Patient is on metformin as an outpatient -Continue SSI  Obesity Body mass index is 37.96 kg/m.  DVT prophylaxis: SCDs Code Status:   Code Status: Full Code Family Communication: None at bedside Disposition Plan:  Discharge pending GI/Oncology recommendations   Consultants:   [facility]  Procedures:   ERCP (09/22/2019) Impression:               - The major papilla appeared to be bulging and                            congested as a result of the submucosal protrusion                            of the mass.                           - A single severe biliary stricture was found in                            the lower third of the main bile duct. The                            stricture was malignant appearing.                           - The upper third of the main bile duct, middle                            third of the main bile duct and left and right                            hepatic ducts  and all intrahepatic branches were                            severely dilated, secondary to the stricture.                           - A biliary sphincterotomy was performed.                           - Cells for cytology obtained in the lower third of                            the main duct stricture.                           - One uncovered metal biliary stent was placed into                            the common bile duct. This was dilated to ensure                            stent expansion.  Recommendation:           - The patient will be observed post-procedure,                            until all discharge criteria are met.                           - Return patient to hospital ward for ongoing care.                           - Check liver enzymes (AST, ALT, alkaline                            phosphatase, bilirubin) in the morning as well as a                            CA19-9.                           - Observe patient's clinical course.                           - Await cytology results.                           - Watch for pancreatitis, bleeding, perforation,                            and cholangitis.                           - Would consider CT-Chest to complete staging for  patient.                           - Oncology referral as inpatient vs outpatient                            based on patient status in the next 24-48 hours                            should be obtained.                           - Hold Chemical VTE PPx for 48 hours to decrease                            risk of post-sphincterotomy bleeding if possible -                            SCDs, TEDs OK as well as UOB.  Upper EUS (09/22/2019) Impression:               EGD Impression:                           - No gross lesions in esophagus proximally. Grade I                            esophageal varices distally. Z-line irregular, 38                            cm from the incisors.                           - Erosive gastropathy with no bleeding and no                            stigmata of recent bleeding. No other gross lesions                            in the stomach. Biopsied for HP.                           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 Biopsied superficially.                           EUS Impression:                           - A mass was identified in the pancreatic head.  Cytology results are pending. However, the                            endosonographic appearance is highly suspicious for                            adenocarcinoma. This was staged T2 N1 Mx by EUS                            criteria. However, based on MRI imaging/size on the                            MRICP, this would be staged T3 N1 Mx. The staging                            applies if malignancy is confirmed. Fine needle                            biopsy performed.                           - The pancreatic duct had intraductal stones in the                            pancreatic head and genu of the pancreas. The                             pancreatic duct had a dilated endosonographic                            appearance in the neck/body which tapered to the                            tail but then dilated again without discrete                            mass/lesion/stricture in the tail.                           - Hyperechoic material consistent with sludge was                            visualized endosonographically in the gallbladder.                           - There was dilation in the common bile duct and in                            the common hepatic duct.                           - A few malignant-appearing lymph nodes were  visualized in the gastrohepatic ligament (level 18)                            and porta hepatis region. Cytology results are                            pending. However, the endosonographic appearance is                            suspicious for metastatic pancreatic                            adenocarcinoma. Fine needle biopsy performed.  Recommendation:           - Proceed to scheduled ERCP.                           - Follow up Cytology and Pathology.  Antimicrobials:  Zosyn    Subjective: No issues overnight.  Objective: Vitals:   09/23/19 1451 09/23/19 2028 09/24/19 0549 09/24/19 0812  BP: (!) 150/78 (!) 152/87 (!) 138/99 (!) 143/82  Pulse: 68 74 74 73  Resp: '18 16 18 18  ' Temp: 98.5 F (36.9 C) 98 F (36.7 C) 98.1 F (36.7 C)   TempSrc: Oral Oral Oral   SpO2: 98% 99% 97% 100%  Weight:      Height:        Intake/Output Summary (Last 24 hours) at 09/24/2019 1211 Last data filed at 09/23/2019 1500 Gross per 24 hour  Intake 355.82 ml  Output --  Net 355.82 ml   Filed Weights   09/22/19 0500 09/22/19 0554 09/23/19 0500  Weight: 96.2 kg 97.3 kg 97.2 kg    Examination:  General exam: Appears calm and comfortable Eyes: scleral icterus Respiratory system: Clear to auscultation. Respiratory effort normal. Cardiovascular  system: S1 & S2 heard, RRR. 1/6 systolic murmur. Gastrointestinal system: Abdomen is nondistended, soft and nontender. No organomegaly or masses felt. Normal bowel sounds heard. Central nervous system: Alert and oriented. No focal neurological deficits. Extremities: No edema. No calf tenderness Skin: No cyanosis. No rashes Psychiatry: Judgement and insight appear normal. Mood & affect appropriate.    Data Reviewed: I have personally reviewed following labs and imaging studies  CBC: Recent Labs  Lab 09/20/19 1124 09/21/19 0509 09/22/19 0539 09/23/19 0523  WBC 11.8* 11.0* 9.9 10.0  NEUTROABS 7.8*  --   --  8.5*  HGB 11.5* 10.8* 10.4* 10.5*  HCT 33.9* 31.9* 31.6* 32.2*  MCV 89.1 88.6 90.0 90.4  PLT 415.0 Repeated and verified X2.* 397 383 945*   Basic Metabolic Panel: Recent Labs  Lab 09/20/19 1124 09/21/19 0509 09/22/19 0539 09/23/19 0523 09/24/19 0520  NA 129* 132* 133* 131* 133*  K 3.1* 2.9* 3.3* 4.0 3.4*  CL 92* 98 101 100 100  CO2 '26 24 23 22 26  ' GLUCOSE 111* 125* 134* 170* 146*  BUN 11 8 6* 15 19  CREATININE 0.98 <0.30* 0.38* 0.57 0.56  CALCIUM 9.4 8.6* 8.6* 8.5* 8.4*  MG  --   --  1.8  --   --    GFR: Estimated Creatinine Clearance: 80.9 mL/min (by C-G formula based on SCr of 0.56 mg/dL). Liver Function Tests: Recent Labs  Lab 09/20/19 1124 09/21/19 0388 09/22/19 8280 09/23/19 0349 09/24/19 1791  AST 248* 236* 234* 198* 138*  ALT 157* 138* 138* 137* 111*  ALKPHOS 601* 450* 438* 438* 324*  BILITOT 26.6*  23.7* 21.5* 21.2* 14.7* 8.7*  PROT 7.4 6.8 6.8 6.9 6.1*  ALBUMIN 3.4* 2.2* 2.3* 2.2* 2.1*   Recent Labs  Lab 09/23/19 0523 09/24/19 0520  LIPASE 310* 266*   No results for input(s): AMMONIA in the last 168 hours. Coagulation Profile: Recent Labs  Lab 09/20/19 1124  INR 1.3*   Cardiac Enzymes: Recent Labs  Lab 09/21/19 0509  CKTOTAL 78  CKMB 1.6   BNP (last 3 results) No results for input(s): PROBNP in the last 8760 hours. HbA1C: No  results for input(s): HGBA1C in the last 72 hours. CBG: Recent Labs  Lab 09/23/19 2012 09/23/19 2358 09/24/19 0410 09/24/19 0752 09/24/19 1152  GLUCAP 190* 146* 143* 150* 162*   Lipid Profile: No results for input(s): CHOL, HDL, LDLCALC, TRIG, CHOLHDL, LDLDIRECT in the last 72 hours. Thyroid Function Tests: No results for input(s): TSH, T4TOTAL, FREET4, T3FREE, THYROIDAB in the last 72 hours. Anemia Panel: No results for input(s): VITAMINB12, FOLATE, FERRITIN, TIBC, IRON, RETICCTPCT in the last 72 hours. Sepsis Labs: No results for input(s): PROCALCITON, LATICACIDVEN in the last 168 hours.  Recent Results (from the past 240 hour(s))  SARS CORONAVIRUS 2 (TAT 6-24 HRS) Nasopharyngeal Nasopharyngeal Swab     Status: None   Collection Time: 09/20/19  9:35 PM   Specimen: Nasopharyngeal Swab  Result Value Ref Range Status   SARS Coronavirus 2 NEGATIVE NEGATIVE Final    Comment: (NOTE) SARS-CoV-2 target nucleic acids are NOT DETECTED. The SARS-CoV-2 RNA is generally detectable in upper and lower respiratory specimens during the acute phase of infection. Negative results do not preclude SARS-CoV-2 infection, do not rule out co-infections with other pathogens, and should not be used as the sole basis for treatment or other patient management decisions. Negative results must be combined with clinical observations, patient history, and epidemiological information. The expected result is Negative. Fact Sheet for Patients: SugarRoll.be Fact Sheet for Healthcare Providers: https://www.woods-mathews.com/ This test is not yet approved or cleared by the [place] FDA and  has been authorized for detection and/or diagnosis of SARS-CoV-2 by FDA under an Emergency Use Authorization (EUA). This EUA will remain  in effect (meaning this test can be used) for the duration of the COVID-19 declaration under Section 56 4(b)(1) of the Act, 21  U.S.C. section 360bbb-3(b)(1), unless the authorization is terminated or revoked sooner. Performed at [facility], [place] [address] [place],  [address]          Radiology Studies: CT CHEST W CONTRAST  Result Date: 09/23/2019 CLINICAL DATA:  64 year old female with pancreatic cancer staging. EXAM: CT CHEST WITH CONTRAST TECHNIQUE: Multidetector CT imaging of the chest was performed during intravenous contrast administration. CONTRAST:  62m OMNIPAQUE IOHEXOL 300 MG/ML  SOLN COMPARISON:  Abdominal MRI dated 09/20/2019 and ultrasound dated 09/20/2019 FINDINGS: Cardiovascular: There is no cardiomegaly or pericardial effusion. Coronary vascular calcifications primarily involving the RCA and left circumflex artery. There is mild atherosclerotic calcification of the thoracic aorta. No aneurysmal dilatation or dissection. The origins of the great vessels of the aortic arch appear patent as visualized. The central pulmonary arteries are patent. Mediastinum/Nodes: There is no hilar or mediastinal adenopathy. Mildly rounded and enlarged lymph node in the right cardiophrenic angle (series 2, image 97) measures 11 mm in diameter. The esophagus and the thyroid gland are grossly unremarkable. No mediastinal fluid collection. Lungs/Pleura: The  lungs are clear. There is no pleural effusion or pneumothorax. The central airways are patent. Upper Abdomen: Pneumobilia and partially visualized biliary stent. There is a 3.2 x 1.7 cm low attenuating lesion in the body of the pancreas. Several mildly enlarged gastrohepatic lymph nodes including a 12 mm mildly rounded lymph node (series 2, image 126). Musculoskeletal: Mild degenerative changes of the spine. No acute osseous pathology. IMPRESSION: 1. No acute intrathoracic pathology. No CT evidence of intrathoracic metastatic disease. A single mildly enlarged and rounded lymph node at the right cardiophrenic angle measures 11 mm in diameter. 2. Partially visualized  CBD stent and associated pneumobilia. 3. A low attenuating mass in the distal body of the pancreas and several mildly enlarged gastrohepatic lymph nodes. Electronically Signed   By: Anner Crete M.D.   On: 09/23/2019 19:31   DG ERCP BILIARY & PANCREATIC DUCTS  Result Date: 09/22/2019 CLINICAL DATA:  64 year old female with a history of jaundice EXAM: ERCP TECHNIQUE: Multiple spot images obtained with the fluoroscopic device and submitted for interpretation post-procedure. FLUOROSCOPY TIME:  Fluoroscopy Time:  4 minutes 24 seconds COMPARISON:  None. FINDINGS: Limited intraoperative fluoroscopic spot images during ERCP. Initial image demonstrates endoscope projecting over the upper abdomen. Vague calcifications/densities of the upper abdomen may reflect calcifications of the pancreatic head. Subsequently there is cannulation of the ampulla and retrograde infusion of contrast with partial opacification of the extrahepatic biliary ductal system. There is then placement of a metallic biliary stent and PTA biliary stricture. IMPRESSION: Limited images of ERCP demonstrates treatment of biliary stricture with stenting and PTA. Please refer to the dictated operative report for full details of intraoperative findings and procedure. Electronically Signed   By: Corrie Mckusick D.O.   On: 09/22/2019 16:26        Scheduled Meds: . amLODipine  10 mg Oral QHS  . feeding supplement  1 Container Oral TID BM  . indomethacin  100 mg Rectal Once  . insulin aspart  0-9 Units Subcutaneous Q4H  . irbesartan  37.5 mg Oral Daily  . metoprolol succinate  25 mg Oral Daily   Continuous Infusions:    LOS: 4 days     Cordelia Poche, MD [facility] 09/24/2019, 12:11 PM  If 7PM-7AM, please contact night-coverage [email or web address]

## 2019-09-24 NOTE — H&P (View-Only) (Signed)
[facility] Progress Note  CC: Obstructive jaundice, pancreatic mass, biliary stricture  Subjective: She denies having any nausea or vomiting.  No abdominal pain.  She reported spitting up streaks of blood following her ERCP/ERCP on 2/3.  She is eating ice which has relieved her throat discomfort and is spitting out less streaks of blood at this time.  She is able to swallow solid foods and liquids without any difficulty.  She passed a small greenish to brown loose stool earlier this morning.  No rectal bleeding or melena.  She consents to repeat EUS for additional pancreatic tissue sampling as requested by oncology.  Objective:   S/P ERCP 09/22/2019: - The major papilla appeared to be bulging and congested as a result of the submucosal protrusion of the mass. - A single severe biliary stricture was found in the lower third of the main bile duct. The stricture was malignant appearing. - The upper third of the main bile duct, middle third of the main bile duct and left and right hepatic ducts and all intrahepatic branches were severely dilated, secondary to the stricture. - A biliary sphincterotomy was performed. - Cells for cytology obtained in the lower third of the main duct stricture. - One uncovered metal biliary stent was placed into the common bile duct. This was dilated to ensure stent expansion.  S/P EUS with EGD 09/22/2019:  EGD Impression: - No gross lesions in esophagus proximally. Grade I esophageal varices distally. Z-line irregular, 38 cm from the incisors. - Erosive gastropathy with no bleeding and no stigmata of recent bleeding. No other gross lesions in the stomach. Biopsied for HP. 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 Biopsied superficially.  EUS Impression: - A mass was identified in the pancreatic head. Cytology results are pending. However,  the endosonographic appearance is highly suspicious for adenocarcinoma. This was staged T2 N1 Mx by EUS criteria. However, based on MRI imaging/size on the MRICP, this would be staged T3 N1 Mx. The staging applies if malignancy is confirmed. Fine needle biopsy performed. - The pancreatic duct had intraductal stones in the pancreatic head and genu of the pancreas. The pancreatic duct had a dilated endosonographic appearance in the neck/body which tapered to the tail but then dilated again without discrete mass/lesion/stricture in the tail. - Hyperechoic material consistent with sludge was visualized endosonographically in the - Hyperechoic material consistent with sludge was visualized endosonographically in the gallbladder. - There was dilation in the common bile duct and in the common hepatic duct. - A few malignant-appearing lymph nodes were visualized in the gastrohepatic ligament (level 18) and porta hepatis region. Cytology results are pending. However, the endosonographic appearance is suspicious for metastatic pancreatic adenocarcinoma. Fine needle biopsy performed.   Vital signs in last 24 hours: Temp:  [98 F (36.7 C)-98.5 F (36.9 C)] 98.1 F (36.7 C) 10/20/2022 0549) Pulse Rate:  [68-74] 73 2022/10/20 0812) Resp:  [16-18] 18 10/20/2022 0812) BP: (138-152)/(78-99) 143/82 20-Oct-2022 0812) SpO2:  [97 %-100 %] 100 % 10/20/2022 0812) Last BM Date: 09/22/19 General:   Alert 64 year old female in no acute distress. Eyes: Scleral icterus present, conjunctiva pink. Heart: Regular rate and rhythm, no murmurs. Pulm: Breath sounds clear throughout. Abdomen: Soft, nontender,.  Nondistended.  Positive bowel sounds all four quadrants.  No HSM. Extremities:  Without edema. Neurologic:  Alert and  oriented x4;  grossly normal neurologically. Psych:  Alert and cooperative. Normal mood  and affect.  Intake/Output from previous day: 02/04 0701 - October 19, 2022 0700 In: 355.8 [I.V.:233.8; IV Piggyback:122] Out: -   Intake/Output this shift: No intake/output data recorded.  Lab Results: Recent Labs    09/22/19 0539 09/23/19 0523  WBC 9.9 10.0  HGB 10.4* 10.5*  HCT 31.6* 32.2*  PLT 383 412*   BMET Recent Labs    09/22/19 0539 09/23/19 0523 09/24/19 0520  NA 133* 131* 133*  K 3.3* 4.0 3.4*  CL 101 100 100  CO2 23 22 26   GLUCOSE 134* 170* 146*  BUN 6* 15 19  CREATININE 0.38* 0.57 0.56  CALCIUM 8.6* 8.5* 8.4*   LFT Recent Labs    09/23/19 0523 09/23/19 0523 09/24/19 0520  PROT 6.9   < > 6.1*  ALBUMIN 2.2*   < > 2.1*  AST 198*   < > 138*  ALT 137*   < > 111*  ALKPHOS 438*   < > 324*  BILITOT 14.7*   < > 8.7*  BILIDIR 8.2*  --   --   IBILI 6.5*  --   --    < > = values in this interval not displayed.   PT/INR No results for input(s): LABPROT, INR in the last 72 hours. Hepatitis Panel No results for input(s): HEPBSAG, HCVAB, HEPAIGM, HEPBIGM in the last 72 hours.  CT CHEST W CONTRAST  Result Date: 09/23/2019 CLINICAL DATA:  64 year old female with pancreatic cancer staging. EXAM: CT CHEST WITH CONTRAST TECHNIQUE: Multidetector CT imaging of the chest was performed during intravenous contrast administration. CONTRAST:  69mL OMNIPAQUE IOHEXOL 300 MG/ML  SOLN COMPARISON:  Abdominal MRI dated 09/20/2019 and ultrasound dated 09/20/2019 FINDINGS: Cardiovascular: There is no cardiomegaly or pericardial effusion. Coronary vascular calcifications primarily involving the RCA and left circumflex artery. There is mild atherosclerotic calcification of the thoracic aorta. No aneurysmal dilatation or dissection. The origins of the great vessels of the aortic arch appear patent as visualized. The central pulmonary arteries are patent. Mediastinum/Nodes: There is no hilar or mediastinal adenopathy. Mildly rounded and enlarged lymph node in the right cardiophrenic angle (series 2, image 97) measures 11 mm in diameter. The esophagus and the thyroid gland are grossly unremarkable. No mediastinal fluid  collection. Lungs/Pleura: The lungs are clear. There is no pleural effusion or pneumothorax. The central airways are patent. Upper Abdomen: Pneumobilia and partially visualized biliary stent. There is a 3.2 x 1.7 cm low attenuating lesion in the body of the pancreas. Several mildly enlarged gastrohepatic lymph nodes including a 12 mm mildly rounded lymph node (series 2, image 126). Musculoskeletal: Mild degenerative changes of the spine. No acute osseous pathology. IMPRESSION: 1. No acute intrathoracic pathology. No CT evidence of intrathoracic metastatic disease. A single mildly enlarged and rounded lymph node at the right cardiophrenic angle measures 11 mm in diameter. 2. Partially visualized CBD stent and associated pneumobilia. 3. A low attenuating mass in the distal body of the pancreas and several mildly enlarged gastrohepatic lymph nodes. Electronically Signed   By: Anner Crete M.D.   On: 09/23/2019 19:31   DG ERCP BILIARY & PANCREATIC DUCTS  Result Date: 09/22/2019 CLINICAL DATA:  64 year old female with a history of jaundice EXAM: ERCP TECHNIQUE: Multiple spot images obtained with the fluoroscopic device and submitted for interpretation post-procedure. FLUOROSCOPY TIME:  Fluoroscopy Time:  4 minutes 24 seconds COMPARISON:  None. FINDINGS: Limited intraoperative fluoroscopic spot images during ERCP. Initial image demonstrates endoscope projecting over the upper abdomen. Vague calcifications/densities of the upper abdomen may reflect calcifications  of the pancreatic head. Subsequently there is cannulation of the ampulla and retrograde infusion of contrast with partial opacification of the extrahepatic biliary ductal system. There is then placement of a metallic biliary stent and PTA biliary stricture. IMPRESSION: Limited images of ERCP demonstrates treatment of biliary stricture with stenting and PTA. Please refer to the dictated operative report for full details of intraoperative findings and  procedure. Electronically Signed   By: Corrie Mckusick D.O.   On: 09/22/2019 16:26    Assessment / Plan:  22. 64 year old female with obstructive jaundice. Abdominal MRI/MRCP 09/19/2018 identified biliary and pancreatic duct obstruction with sludge or stones in the CBD and intraductal calculi in the dilated pancreatic duct (some calculi may be as large as 1cm), a pancreatic/ampullary mass which extends into the duodenum with obstruction of the biliary and pancreatic ducts concerning for ampullary vs pancreatic adenocarcinoma and a questionable small lesion to the tail of the pancreas. S/ P ERCP with biliary sphincterotomy and metal stent placement to the CBD 2/3. A severe biliary stricture to the lower main bile duct was identified with left and right hepatic duct dilatation. S/P EUS 2/3 confirmed a pancreatic mass concerning for adenocarcinoma and pancreatic intraductal stones in the head of the pancreas. A few malignant appearing lymph nodes to the gastrohepatic ligament and porta hepatis region. Post ERCP/EUS Lipase Lipase 310 >>266 . She is afebrile. No abdominal pain. No N/V. -Plan for repeat EUS with further pancreatic biopsies on 09/25/2019 -Repeat CBC, CMP and lipase in am  2. Chronic Hepatitis C GT1a with cirrhosis treated with Harvoni in 2019 with SVR.  EGD 2/3 identified grade 1 esophageal varices. -Follow up as outpatient   3. Normocytic Anemia. Hg 11.5 >> 10.8 >> 10.5. HCT 33.9 >>31.9 >> 32.2. No signs of active GI bleeding.  -Repeat CBC in am   4. Hyponatremia, improving. Sodium 133.  5. Hypokalemia. Potassium 3.4.   6. DM II    Principal Problem:   Abnormal liver function Active Problems:   DM (diabetes mellitus) ([place])   Hypokalemia   Hyponatremia   Pancreatic mass     LOS: 4 days   Noralyn Pick  09/24/2019, 9:58 AM

## 2019-09-24 NOTE — Progress Notes (Signed)
[facility] Progress Note  CC: Obstructive jaundice, pancreatic mass, biliary stricture  Subjective: She denies having any nausea or vomiting.  No abdominal pain.  She reported spitting up streaks of blood following her ERCP/ERCP on 2/3.  She is eating ice which has relieved her throat discomfort and is spitting out less streaks of blood at this time.  She is able to swallow solid foods and liquids without any difficulty.  She passed a small greenish to brown loose stool earlier this morning.  No rectal bleeding or melena.  She consents to repeat EUS for additional pancreatic tissue sampling as requested by oncology.  Objective:   S/P ERCP 09/22/2019: - The major papilla appeared to be bulging and congested as a result of the submucosal protrusion of the mass. - A single severe biliary stricture was found in the lower third of the main bile duct. The stricture was malignant appearing. - The upper third of the main bile duct, middle third of the main bile duct and left and right hepatic ducts and all intrahepatic branches were severely dilated, secondary to the stricture. - A biliary sphincterotomy was performed. - Cells for cytology obtained in the lower third of the main duct stricture. - One uncovered metal biliary stent was placed into the common bile duct. This was dilated to ensure stent expansion.  S/P EUS with EGD 09/22/2019:  EGD Impression: - No gross lesions in esophagus proximally. Grade I esophageal varices distally. Z-line irregular, 38 cm from the incisors. - Erosive gastropathy with no bleeding and no stigmata of recent bleeding. No other gross lesions in the stomach. Biopsied for HP. 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 Biopsied superficially.  EUS Impression: - A mass was identified in the pancreatic head. Cytology results are pending. However,  the endosonographic appearance is highly suspicious for adenocarcinoma. This was staged T2 N1 Mx by EUS criteria. However, based on MRI imaging/size on the MRICP, this would be staged T3 N1 Mx. The staging applies if malignancy is confirmed. Fine needle biopsy performed. - The pancreatic duct had intraductal stones in the pancreatic head and genu of the pancreas. The pancreatic duct had a dilated endosonographic appearance in the neck/body which tapered to the tail but then dilated again without discrete mass/lesion/stricture in the tail. - Hyperechoic material consistent with sludge was visualized endosonographically in the - Hyperechoic material consistent with sludge was visualized endosonographically in the gallbladder. - There was dilation in the common bile duct and in the common hepatic duct. - A few malignant-appearing lymph nodes were visualized in the gastrohepatic ligament (level 18) and porta hepatis region. Cytology results are pending. However, the endosonographic appearance is suspicious for metastatic pancreatic adenocarcinoma. Fine needle biopsy performed.   Vital signs in last 24 hours: Temp:  [98 F (36.7 C)-98.5 F (36.9 C)] 98.1 F (36.7 C) 10-02-2022 0549) Pulse Rate:  [68-74] 73 October 02, 2022 0812) Resp:  [16-18] 18 Oct 02, 2022 0812) BP: (138-152)/(78-99) 143/82 02-Oct-2022 0812) SpO2:  [97 %-100 %] 100 % October 02, 2022 0812) Last BM Date: 09/22/19 General:   Alert 64 year old female in no acute distress. Eyes: Scleral icterus present, conjunctiva pink. Heart: Regular rate and rhythm, no murmurs. Pulm: Breath sounds clear throughout. Abdomen: Soft, nontender,.  Nondistended.  Positive bowel sounds all four quadrants.  No HSM. Extremities:  Without edema. Neurologic:  Alert and  oriented x4;  grossly normal neurologically. Psych:  Alert and cooperative. Normal mood  and affect.  Intake/Output from previous day: 02/04 0701 - 2022-10-17 0700 In: 355.8 [I.V.:233.8; IV Piggyback:122] Out: -   Intake/Output this shift: No intake/output data recorded.  Lab Results: Recent Labs    09/22/19 0539 09/23/19 0523  WBC 9.9 10.0  HGB 10.4* 10.5*  HCT 31.6* 32.2*  PLT 383 412*   BMET Recent Labs    09/22/19 0539 09/23/19 0523 09/24/19 0520  NA 133* 131* 133*  K 3.3* 4.0 3.4*  CL 101 100 100  CO2 23 22 26   GLUCOSE 134* 170* 146*  BUN 6* 15 19  CREATININE 0.38* 0.57 0.56  CALCIUM 8.6* 8.5* 8.4*   LFT Recent Labs    09/23/19 0523 09/23/19 0523 09/24/19 0520  PROT 6.9   < > 6.1*  ALBUMIN 2.2*   < > 2.1*  AST 198*   < > 138*  ALT 137*   < > 111*  ALKPHOS 438*   < > 324*  BILITOT 14.7*   < > 8.7*  BILIDIR 8.2*  --   --   IBILI 6.5*  --   --    < > = values in this interval not displayed.   PT/INR No results for input(s): LABPROT, INR in the last 72 hours. Hepatitis Panel No results for input(s): HEPBSAG, HCVAB, HEPAIGM, HEPBIGM in the last 72 hours.  CT CHEST W CONTRAST  Result Date: 09/23/2019 CLINICAL DATA:  64 year old female with pancreatic cancer staging. EXAM: CT CHEST WITH CONTRAST TECHNIQUE: Multidetector CT imaging of the chest was performed during intravenous contrast administration. CONTRAST:  37mL OMNIPAQUE IOHEXOL 300 MG/ML  SOLN COMPARISON:  Abdominal MRI dated 09/20/2019 and ultrasound dated 09/20/2019 FINDINGS: Cardiovascular: There is no cardiomegaly or pericardial effusion. Coronary vascular calcifications primarily involving the RCA and left circumflex artery. There is mild atherosclerotic calcification of the thoracic aorta. No aneurysmal dilatation or dissection. The origins of the great vessels of the aortic arch appear patent as visualized. The central pulmonary arteries are patent. Mediastinum/Nodes: There is no hilar or mediastinal adenopathy. Mildly rounded and enlarged lymph node in the right cardiophrenic angle (series 2, image 97) measures 11 mm in diameter. The esophagus and the thyroid gland are grossly unremarkable. No mediastinal fluid  collection. Lungs/Pleura: The lungs are clear. There is no pleural effusion or pneumothorax. The central airways are patent. Upper Abdomen: Pneumobilia and partially visualized biliary stent. There is a 3.2 x 1.7 cm low attenuating lesion in the body of the pancreas. Several mildly enlarged gastrohepatic lymph nodes including a 12 mm mildly rounded lymph node (series 2, image 126). Musculoskeletal: Mild degenerative changes of the spine. No acute osseous pathology. IMPRESSION: 1. No acute intrathoracic pathology. No CT evidence of intrathoracic metastatic disease. A single mildly enlarged and rounded lymph node at the right cardiophrenic angle measures 11 mm in diameter. 2. Partially visualized CBD stent and associated pneumobilia. 3. A low attenuating mass in the distal body of the pancreas and several mildly enlarged gastrohepatic lymph nodes. Electronically Signed   By: Anner Crete M.D.   On: 09/23/2019 19:31   DG ERCP BILIARY & PANCREATIC DUCTS  Result Date: 09/22/2019 CLINICAL DATA:  64 year old female with a history of jaundice EXAM: ERCP TECHNIQUE: Multiple spot images obtained with the fluoroscopic device and submitted for interpretation post-procedure. FLUOROSCOPY TIME:  Fluoroscopy Time:  4 minutes 24 seconds COMPARISON:  None. FINDINGS: Limited intraoperative fluoroscopic spot images during ERCP. Initial image demonstrates endoscope projecting over the upper abdomen. Vague calcifications/densities of the upper abdomen may reflect calcifications  of the pancreatic head. Subsequently there is cannulation of the ampulla and retrograde infusion of contrast with partial opacification of the extrahepatic biliary ductal system. There is then placement of a metallic biliary stent and PTA biliary stricture. IMPRESSION: Limited images of ERCP demonstrates treatment of biliary stricture with stenting and PTA. Please refer to the dictated operative report for full details of intraoperative findings and  procedure. Electronically Signed   By: Corrie Mckusick D.O.   On: 09/22/2019 16:26    Assessment / Plan:  42. 64 year old female with obstructive jaundice. Abdominal MRI/MRCP 09/19/2018 identified biliary and pancreatic duct obstruction with sludge or stones in the CBD and intraductal calculi in the dilated pancreatic duct (some calculi may be as large as 1cm), a pancreatic/ampullary mass which extends into the duodenum with obstruction of the biliary and pancreatic ducts concerning for ampullary vs pancreatic adenocarcinoma and a questionable small lesion to the tail of the pancreas. S/ P ERCP with biliary sphincterotomy and metal stent placement to the CBD 2/3. A severe biliary stricture to the lower main bile duct was identified with left and right hepatic duct dilatation. S/P EUS 2/3 confirmed a pancreatic mass concerning for adenocarcinoma and pancreatic intraductal stones in the head of the pancreas. A few malignant appearing lymph nodes to the gastrohepatic ligament and porta hepatis region. Post ERCP/EUS Lipase Lipase 310 >>266 . She is afebrile. No abdominal pain. No N/V. -Plan for repeat EUS with further pancreatic biopsies on 09/25/2019 -Repeat CBC, CMP and lipase in am  2. Chronic Hepatitis C GT1a with cirrhosis treated with Harvoni in 2019 with SVR.  EGD 2/3 identified grade 1 esophageal varices. -Follow up as outpatient   3. Normocytic Anemia. Hg 11.5 >> 10.8 >> 10.5. HCT 33.9 >>31.9 >> 32.2. No signs of active GI bleeding.  -Repeat CBC in am   4. Hyponatremia, improving. Sodium 133.  5. Hypokalemia. Potassium 3.4.   6. DM II    Principal Problem:   Abnormal liver function Active Problems:   DM (diabetes mellitus) ([place])   Hypokalemia   Hyponatremia   Pancreatic mass     LOS: 4 days   Noralyn Pick  09/24/2019, 9:58 AM

## 2019-09-24 NOTE — Discharge Instructions (Addendum)
Melanie Alvarez,  You are in the hospital because of abnormal liver tests.  After evaluation it was found that you have pancreatic cancer.  You will need to follow-up with the oncologist in addition to the gastroenterologist as an outpatient.  Your medications have been adjusted on discharge.  He will be going home on Protonix for 8 weeks secondary to an ulcer in your stomach.  I recommend holding your Lipitor until your liver tests are improved.  If you develop bad abdominal pain, nausea, vomiting, please return to be reevaluated as this could be related to pancreatitis in the setting of your recent procedures.   Implanted Port Insertion, Care After This sheet gives you information about how to care for yourself after your procedure. Your health care provider may also give you more specific instructions. If you have problems or questions, contact your health care provider. What can I expect after the procedure? After the procedure, it is common to have:  Discomfort at the port insertion site.  Bruising on the skin over the port. This should improve over 3-4 days. Follow these instructions at home: [facility] care  After your port is placed, you will get a manufacturer's information card. The card has information about your port. Keep this card with you at all times.  Take care of the port as told by your health care provider. Ask your health care provider if you or a family member can get training for taking care of the port at home. A home health care nurse may also take care of the port.  Make sure to remember what type of port you have. Incision care      Follow instructions from your health care provider about how to take care of your port insertion site. Make sure you: ? Wash your hands with soap and water before and after you change your bandage (dressing). If soap and water are not available, use hand sanitizer. ? Change your dressing as told by your health care provider. ? Leave  stitches (sutures), skin glue, or adhesive strips in place. These skin closures may need to stay in place for 2 weeks or longer. If adhesive strip edges start to loosen and curl up, you may trim the loose edges. Do not remove adhesive strips completely unless your health care provider tells you to do that.  Check your port insertion site every day for signs of infection. Check for: ? Redness, swelling, or pain. ? Fluid or blood. ? Warmth. ? Pus or a bad smell. Activity  Return to your normal activities as told by your health care provider. Ask your health care provider what activities are safe for you.  Do not lift anything that is heavier than 10 lb (4.5 kg), or the limit that you are told, until your health care provider says that it is safe. General instructions  Take over-the-counter and prescription medicines only as told by your health care provider.  Do not take baths, swim, or use a hot tub until your health care provider approves. Ask your health care provider if you may take showers. You may only be allowed to take sponge baths.  Do not drive for 24 hours if you were given a sedative during your procedure.  Wear a medical alert bracelet in case of an emergency. This will tell any health care providers that you have a port.  Keep all follow-up visits as told by your health care provider. This is important. Contact a health care provider if:  You cannot flush your port with saline as directed, or you cannot draw blood from the port.  You have a fever or chills.  You have redness, swelling, or pain around your port insertion site.  You have fluid or blood coming from your port insertion site.  Your port insertion site feels warm to the touch.  You have pus or a bad smell coming from the port insertion site. Get help right away if:  You have chest pain or shortness of breath.  You have bleeding from your port that you cannot control. Summary  Take care of the port as  told by your health care provider. Keep the manufacturer's information card with you at all times.  Change your dressing as told by your health care provider.  Contact a health care provider if you have a fever or chills or if you have redness, swelling, or pain around your port insertion site.  Keep all follow-up visits as told by your health care provider. This information is not intended to replace advice given to you by your health care provider. Make sure you discuss any questions you have with your health care provider. Document Revised: 03/03/2018 Document Reviewed: 03/03/2018 Elsevier Patient Education  [place] An implanted port is a device that is placed under the skin. It is usually placed in the chest. The device can be used to give IV medicine, to take blood, or for dialysis. You may have an implanted port if:  You need IV medicine that would be irritating to the small veins in your hands or arms.  You need IV medicines, such as antibiotics, for a long period of time.  You need IV nutrition for a long period of time.  You need dialysis. Having a port means that your health care provider will not need to use the veins in your arms for these procedures. You may have fewer limitations when using a port than you would if you used other types of long-term IVs, and you will likely be able to return to normal activities after your incision heals. An implanted port has two main parts:  Reservoir. The reservoir is the part where a needle is inserted to give medicines or draw blood. The reservoir is round. After it is placed, it appears as a small, raised area under your skin.  Catheter. The catheter is a thin, flexible tube that connects the reservoir to a vein. Medicine that is inserted into the reservoir goes into the catheter and then into the vein. How is my port accessed? To access your port:  A numbing cream may be placed on the skin over  the port site.  Your health care provider will put on a mask and sterile gloves.  The skin over your port will be cleaned carefully with a germ-killing soap and allowed to dry.  Your health care provider will gently pinch the port and insert a needle into it.  Your health care provider will check for a blood return to make sure the port is in the vein and is not clogged.  If your port needs to remain accessed to get medicine continuously (constant infusion), your health care provider will place a clear bandage (dressing) over the needle site. The dressing and needle will need to be changed every week, or as told by your health care provider. What is flushing? Flushing helps keep the port from getting clogged. Follow instructions from your health care provider about how and  when to flush the port. Ports are usually flushed with saline solution or a medicine called heparin. The need for flushing will depend on how the port is used:  If the port is only used from time to time to give medicines or draw blood, the port may need to be flushed: ? Before and after medicines have been given. ? Before and after blood has been drawn. ? As part of routine maintenance. Flushing may be recommended every 4-6 weeks.  If a constant infusion is running, the port may not need to be flushed.  Throw away any syringes in a disposal container that is meant for sharp items (sharps container). You can buy a sharps container from a pharmacy, or you can make one by using an empty hard plastic bottle with a cover. How long will my port stay implanted? The port can stay in for as long as your health care provider thinks it is needed. When it is time for the port to come out, a surgery will be done to remove it. The surgery will be similar to the procedure that was done to put the port in. Follow these instructions at home:   Flush your port as told by your health care provider.  If you need an infusion over several  days, follow instructions from your health care provider about how to take care of your port site. Make sure you: ? Wash your hands with soap and water before you change your dressing. If soap and water are not available, use alcohol-based hand sanitizer. ? Change your dressing as told by your health care provider. ? Place any used dressings or infusion bags into a plastic bag. Throw that bag in the trash. ? Keep the dressing that covers the needle clean and dry. Do not get it wet. ? Do not use scissors or sharp objects near the tube. ? Keep the tube clamped, unless it is being used.  Check your port site every day for signs of infection. Check for: ? Redness, swelling, or pain. ? Fluid or blood. ? Pus or a bad smell.  Protect the skin around the port site. ? Avoid wearing bra straps that rub or irritate the site. ? Protect the skin around your port from seat belts. Place a soft pad over your chest if needed.  Bathe or shower as told by your health care provider. The site may get wet as long as you are not actively receiving an infusion.  Return to your normal activities as told by your health care provider. Ask your health care provider what activities are safe for you.  Carry a medical alert card or wear a medical alert bracelet at all times. This will let health care providers know that you have an implanted port in case of an emergency. Get help right away if:  You have redness, swelling, or pain at the port site.  You have fluid or blood coming from your port site.  You have pus or a bad smell coming from the port site.  You have a fever. Summary  Implanted ports are usually placed in the chest for long-term IV access.  Follow instructions from your health care provider about flushing the port and changing bandages (dressings).  Take care of the area around your port by avoiding clothing that puts pressure on the area, and by watching for signs of infection.  Protect the  skin around your port from seat belts. Place a soft pad over your chest if  needed.  Get help right away if you have a fever or you have redness, swelling, pain, drainage, or a bad smell at the port site. This information is not intended to replace advice given to you by your health care provider. Make sure you discuss any questions you have with your health care provider. Document Revised: 11/27/2018 Document Reviewed: 09/07/2016 Elsevier Patient Education  [place]. Moderate Conscious Sedation, Adult, Care After These instructions provide you with information about caring for yourself after your procedure. Your health care provider may also give you more specific instructions. Your treatment has been planned according to current medical practices, but problems sometimes occur. Call your health care provider if you have any problems or questions after your procedure. What can I expect after the procedure? After your procedure, it is common:  To feel sleepy for several hours.  To feel clumsy and have poor balance for several hours.  To have poor judgment for several hours.  To vomit if you eat too soon. Follow these instructions at home: For at least 24 hours after the procedure:   Do not: ? Participate in activities where you could fall or become injured. ? Drive. ? Use heavy machinery. ? Drink alcohol. ? Take sleeping pills or medicines that cause drowsiness. ? Make important decisions or sign legal documents. ? Take care of children on your own.  Rest. Eating and drinking  Follow the diet recommended by your health care provider.  If you vomit: ? Drink water, juice, or soup when you can drink without vomiting. ? Make sure you have little or no nausea before eating solid foods. General instructions  Have a responsible adult stay with you until you are awake and alert.  Take over-the-counter and prescription medicines only as told by your health care  provider.  If you smoke, do not smoke without supervision.  Keep all follow-up visits as told by your health care provider. This is important. Contact a health care provider if:  You keep feeling nauseous or you keep vomiting.  You feel light-headed.  You develop a rash.  You have a fever. Get help right away if:  You have trouble breathing. This information is not intended to replace advice given to you by your health care provider. Make sure you discuss any questions you have with your health care provider. Document Revised: 07/18/2017 Document Reviewed: 11/25/2015 Elsevier Patient Education  2020 [employer].

## 2019-09-24 NOTE — Consult Note (Signed)
Chief Complaint: Patient was seen in consultation today for Port-A-Cath placement Chief Complaint  Patient presents with  . Abnormal Lab    Referring Physician(s): Ennever,P  Supervising Physician: Daryll Brod  Patient Status: [facility] - In-pt  History of Present Illness: Melanie Alvarez is a 64 y.o. female with past medical history significant for alcoholic cirrhosis, diabetes, diverticulosis, hepatitis C, hypertension, obesity who was admitted to [facility] on 2/1 with elevated LFT's/obstructive jaundice and concern for malignant pancreatic mass. She is status post ERCP/EUS on 2/3 with cytology from pancreatic mass biopsy revealing adenocarcinoma.  Request now received from oncology for Port-A-Cath placement. Past Medical History:  Diagnosis Date  . Cirrhosis ([place])   . Diabetes mellitus 2012  . Diverticulosis   . Gallbladder sludge   . Hepatitis 2010   history of Hepatitis C  . Hepatitis C   . Hypertension 2011  . Obesity     Past Surgical History:  Procedure Laterality Date  . BILIARY BRUSHING  09/22/2019   Procedure: BILIARY BRUSHING;  Surgeon: Rush Landmark Telford Nab., MD;  Location: [facility];  Service: Gastroenterology;;  . BILIARY DILATION  09/22/2019   Procedure: BILIARY DILATION;  Surgeon: Irving Copas., MD;  Location: [facility];  Service: Gastroenterology;;  . BILIARY STENT PLACEMENT N/A 09/22/2019   Procedure: BILIARY STENT PLACEMENT;  Surgeon: Irving Copas., MD;  Location: [facility];  Service: Gastroenterology;  Laterality: N/A;  . COLONOSCOPY  07/24/2011   Procedure: COLONOSCOPY;  Surgeon: Landry Dyke, MD;  Location: [facility];  Service: Endoscopy;  Laterality: N/A;  . Dental Surgery Tooth Extraction    . ERCP N/A 09/22/2019   Procedure: ENDOSCOPIC RETROGRADE CHOLANGIOPANCREATOGRAPHY (ERCP);  Surgeon: Irving Copas., MD;  Location: [facility];  Service: Gastroenterology;  Laterality: N/A;  .  ESOPHAGOGASTRODUODENOSCOPY (EGD) WITH PROPOFOL N/A 09/22/2019   Procedure: ESOPHAGOGASTRODUODENOSCOPY (EGD) WITH PROPOFOL;  Surgeon: Rush Landmark Telford Nab., MD;  Location: [facility];  Service: Gastroenterology;  Laterality: N/A;  . EUS N/A 09/22/2019   Procedure: UPPER ENDOSCOPIC ULTRASOUND (EUS) RADIAL;  Surgeon: Rush Landmark Telford Nab., MD;  Location: [facility];  Service: Gastroenterology;  Laterality: N/A;  . FINE NEEDLE ASPIRATION  09/22/2019   Procedure: FINE NEEDLE ASPIRATION (FNA) LINEAR;  Surgeon: Irving Copas., MD;  Location: [facility];  Service: Gastroenterology;;  . Joan Mayans  09/22/2019   Procedure: Joan Mayans;  Surgeon: Irving Copas., MD;  Location: [facility];  Service: Gastroenterology;;    Allergies: Lisinopril  Medications: Prior to Admission medications   Medication Sig Start Date End Date Taking? Authorizing Provider  allopurinol (ZYLOPRIM) 100 MG tablet Take 1 tablet (100 mg total) by mouth 3 (three) times daily after meals. 05/26/19  Yes Nche, Charlene Brooke, NP  amLODipine (NORVASC) 10 MG tablet Take 1 tablet (10 mg total) by mouth at bedtime. 05/26/19  Yes Nche, Charlene Brooke, NP  atorvastatin (LIPITOR) 20 MG tablet Take 1 tablet (20 mg total) by mouth daily at 6 PM. 05/26/19  Yes Nche, Charlene Brooke, NP  cholecalciferol (VITAMIN D) 400 UNITS TABS tablet Take 400 Units by mouth daily.   Yes [provider]  Lecithin 1200 MG CAPS Take by mouth.   Yes [provider]  metFORMIN (GLUCOPHAGE) 500 MG tablet Take 1 tablet (500 mg total) by mouth 2 (two) times daily with a meal. 05/26/19  Yes Nche, Charlene Brooke, NP  metoprolol succinate (TOPROL-XL) 25 MG 24 hr tablet Take 1 tablet (25 mg total) by mouth daily. Take with or immediately following a meal.  05/26/19  Yes Nche, Charlene Brooke, NP  Milk Thistle 1000 MG CAPS Take by mouth.   Yes [provider]  olmesartan (BENICAR) 5 MG tablet Take 1 tablet (5 mg total) by mouth  daily. 06/30/19  Yes Nche, Charlene Brooke, NP  OVER THE COUNTER MEDICATION Curamin for pain relive.   Yes [provider]  glucose blood test strip Test blood glucose once daily. One Touch-Delica test strips [id number]   Nche, Charlene Brooke, NP  nystatin-triamcinolone (MYCOLOG II) cream Apply 1 application topically 2 (two) times daily. Patient not taking: Reported on 09/20/2019 06/30/19   Nche, Charlene Brooke, NP     Family History  Problem Relation Age of Onset  . Diabetes Sister   . Cancer Maternal Grandmother        leukemia   . Diabetes Maternal Grandfather   . Diabetes Paternal Grandfather   . Diabetes Paternal Grandmother   . Hypertension Sister   . Hypertension Brother   . Anesthesia problems Neg Hx     Social History   Socioeconomic History  . Marital status: Married    Spouse name: Not on file  . Number of children: Not on file  . Years of education: Not on file  . Highest education level: Not on file  Occupational History  . Not on file  Tobacco Use  . Smoking status: Former Smoker    Types: Cigarettes    Quit date: 07/20/2019    Years since quitting: 0.1  . Smokeless tobacco: Never Used  Substance and Sexual Activity  . Alcohol use: Not Currently    Alcohol/week: 30.0 standard drinks    Types: 24 Cans of beer, 6 Shots of liquor per week    Comment: weekends  . Drug use: No    Comment: previously   . Sexual activity: Yes    Partners: Male    Birth control/protection: Post-menopausal  Other Topics Concern  . Not on file  Social History Narrative   Previous [facility] pt.  Last MD was Dr. Delman Cheadle, seen 12/2011      Works part time in [employer] with husband   Social Determinants of Health   Financial Resource Strain:   . Difficulty of Paying Living Expenses: Not on file  Food Insecurity:   . Worried About Charity fundraiser in the Last Year: Not on file  . Ran Out of Food in the Last Year: Not on file    Transportation Needs:   . Lack of Transportation (Medical): Not on file  . Lack of Transportation (Non-Medical): Not on file  Physical Activity:   . Days of Exercise per Week: Not on file  . Minutes of Exercise per Session: Not on file  Stress:   . Feeling of Stress : Not on file  Social Connections:   . Frequency of Communication with Friends and Family: Not on file  . Frequency of Social Gatherings with Friends and Family: Not on file  . Attends Religious Services: Not on file  . Active Member of Clubs or Organizations: Not on file  . Attends Archivist Meetings: Not on file  . Marital Status: Not on file      Review of Systems:  currently denies fever, headache, chest pain, dyspnea,  nausea, vomiting; she has had minimal abd discomfort and a few streaks of blood when coughing.  Vital Signs: BP (!) 143/82 (BP Location: Left Arm)   Pulse 73  Temp 98.1 F (36.7 C) (Oral)   Resp 18   Ht 5\' 3"  (1.6 m)   Wt 214 lb 4.6 oz (97.2 kg)   SpO2 100%   BMI 37.96 kg/m   Physical Exam awake, alert. scleral icterus noted.  Chest clear to auscultation bilaterally.  Heart with regular rate and rhythm.  Abdomen soft, positive bowel sounds, currently nontender.  No significant lower extremity edema.  Imaging: CT CHEST W CONTRAST  Result Date: 09/23/2019 CLINICAL DATA:  64 year old female with pancreatic cancer staging. EXAM: CT CHEST WITH CONTRAST TECHNIQUE: Multidetector CT imaging of the chest was performed during intravenous contrast administration. CONTRAST:  43mL OMNIPAQUE IOHEXOL 300 MG/ML  SOLN COMPARISON:  Abdominal MRI dated 09/20/2019 and ultrasound dated 09/20/2019 FINDINGS: Cardiovascular: There is no cardiomegaly or pericardial effusion. Coronary vascular calcifications primarily involving the RCA and left circumflex artery. There is mild atherosclerotic calcification of the thoracic aorta. No aneurysmal dilatation or dissection. The origins of the great vessels of the  aortic arch appear patent as visualized. The central pulmonary arteries are patent. Mediastinum/Nodes: There is no hilar or mediastinal adenopathy. Mildly rounded and enlarged lymph node in the right cardiophrenic angle (series 2, image 97) measures 11 mm in diameter. The esophagus and the thyroid gland are grossly unremarkable. No mediastinal fluid collection. Lungs/Pleura: The lungs are clear. There is no pleural effusion or pneumothorax. The central airways are patent. Upper Abdomen: Pneumobilia and partially visualized biliary stent. There is a 3.2 x 1.7 cm low attenuating lesion in the body of the pancreas. Several mildly enlarged gastrohepatic lymph nodes including a 12 mm mildly rounded lymph node (series 2, image 126). Musculoskeletal: Mild degenerative changes of the spine. No acute osseous pathology. IMPRESSION: 1. No acute intrathoracic pathology. No CT evidence of intrathoracic metastatic disease. A single mildly enlarged and rounded lymph node at the right cardiophrenic angle measures 11 mm in diameter. 2. Partially visualized CBD stent and associated pneumobilia. 3. A low attenuating mass in the distal body of the pancreas and several mildly enlarged gastrohepatic lymph nodes. Electronically Signed   By: Anner Crete M.D.   On: 09/23/2019 19:31   DG ERCP BILIARY & PANCREATIC DUCTS  Result Date: 09/22/2019 CLINICAL DATA:  64 year old female with a history of jaundice EXAM: ERCP TECHNIQUE: Multiple spot images obtained with the fluoroscopic device and submitted for interpretation post-procedure. FLUOROSCOPY TIME:  Fluoroscopy Time:  4 minutes 24 seconds COMPARISON:  None. FINDINGS: Limited intraoperative fluoroscopic spot images during ERCP. Initial image demonstrates endoscope projecting over the upper abdomen. Vague calcifications/densities of the upper abdomen may reflect calcifications of the pancreatic head. Subsequently there is cannulation of the ampulla and retrograde infusion of contrast  with partial opacification of the extrahepatic biliary ductal system. There is then placement of a metallic biliary stent and PTA biliary stricture. IMPRESSION: Limited images of ERCP demonstrates treatment of biliary stricture with stenting and PTA. Please refer to the dictated operative report for full details of intraoperative findings and procedure. Electronically Signed   By: Corrie Mckusick D.O.   On: 09/22/2019 16:26   MR ABDOMEN MRCP W WO CONTAST  Result Date: 09/20/2019 CLINICAL DATA:  Jaundice. EXAM: MRI ABDOMEN WITHOUT AND WITH CONTRAST (INCLUDING MRCP) TECHNIQUE: Multiplanar multisequence MR imaging of the abdomen was performed both before and after the administration of intravenous contrast. Heavily T2-weighted images of the biliary and pancreatic ducts were obtained, and three-dimensional MRCP images were rendered by post processing. CONTRAST:  73mL GADAVIST GADOBUTROL 1 MMOL/ML IV SOLN COMPARISON:  Abdominal  sonogram of 09/20/2019 FINDINGS: Lower chest: Limited assessment of the lower chest shows no effusion or consolidation. Study limited by patient body habitus overall. 13 mm pre pericardial lymph node is nonspecific. There is also a suggestion of irregularity of soft tissues in the left left supraclavicular region. This could represent nodal disease given findings below. Hepatobiliary: Signs of irregular filling defect in the duodenum, associated with biliary and pancreatic ductal dilation and pancreatic atrophy. Common bile duct is largest 15 mm with marked distension of the intrahepatic biliary tree. Common bile duct with tapered narrowing to the level of the ampulla. Sludge in the dependent gallbladder and small stones. Also likely with sludge in the common bile duct and dependent low signal intensity on reconstructed MRCP sequences that suggests either small stones or biliary sludge in the dependent portion of the distal common bile duct. No visible intrahepatic calculi. Pancreas: Marked  dilation of dorsal pancreatic duct with signs of pancreatic atrophy in intraductal calculi suggested on T2 and MRCP sequences some as large is a cm and mainly present near the ductal confluence. Ampullary filling defect as outlined above. Ductal caliber in maximum axial dimension 14 mm of the pancreatic duct. In addition to extension into the ampulla there is abnormal soft tissue involving the pancreatic head best seen on subtraction images, image 74 of postcontrast data measuring approximately 4.4 x 3.2 cm in greatest axial dimension including extension into the lumen of the duodenum. Spleen:  Spleen is normal size without suspicious lesion. Adrenals/Urinary Tract: Adrenal glands are normal. Small part solid and enhancing renal lesion arises from the lower pole of the right kidney measuring 11 x 9 mm. No signs of hydronephrosis. Stomach/Bowel: No sign of bowel obstruction. Masslike area in the region of the ampulla filling the ampullary portion of the duodenum may measure as large as 3.5 x 3.1 cm in greatest axial dimension and results in ductal obstruction of both pancreatic and biliary duct. Lobular contour of the pancreatic tail with heterogeneity, question of small lesion in this location as well 1.3 cm (image 49, venous phase data set, postcontrast images. Vascular/Lymphatic: Bulky periportal lymph nodes suspicious for neoplastic/metastatic lymph nodes based on location in the appearance of the duodenum. Also with enlarged hepatic gastric lymph nodes. (Image 59, post-contrast images) 1.4 cm portacaval lymph node (Image 46, post-contrast images 9 mm hepatic gastric lymph node. Other: Portal vein and SMV are patent. SMA is patent. No signs of soft tissue in case mint of vascular structures in the upper abdomen. Musculoskeletal: No suspicious bone lesions identified. IMPRESSION: 1. Signs of pancreatic/ampullary mass with extension into duodenum and obstruction of biliary and pancreatic ducts. 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 Endoscopic assessment is suggested. 2. Signs of pancreatic atrophy with lobular area in the pancreatic tail the raise the question of second small lesion versus is heterogeneous pancreas with signs of prior inflammation. 3. Biliary and pancreatic ductal obstruction with sludge and/or small stones layering dependently in the common bile duct and intraductal calculi suspected in the dilated pancreatic duct near the biliary ductal and pancreatic ductal confluence. Some calculi may be as large as 1 cm. 4. Signs of nodal enlargement in the upper abdomen and even in lower chest raising the question of nodal involvement. 5. Query left supraclavicular nodal enlargement, chest imaging may be helpful given findings above. 6. Small solid and enhancing lesion in the lower pole of the right kidney. Recommend attention on follow-up imaging. This recommendation follows ACR consensus guidelines: White Paper  of the ACR Incidental Findings Committee II on Vascular Findings. J Am Coll Radiol 2013; 10:789-794. 7. These results will be called to the ordering clinician or representative by the Radiologist Assistant, and communication documented in the PACS or zVision Dashboard. Electronically Signed   By: Zetta Bills M.D.   On: 09/20/2019 22:10   US Abdomen Limited RUQ  Result Date: 09/20/2019 CLINICAL DATA:  64 year old female with elevated bilirubin. EXAM: ULTRASOUND ABDOMEN LIMITED RIGHT UPPER QUADRANT COMPARISON:  Ultrasound dated 08/19/2019. FINDINGS: Gallbladder: There is sludge within the gallbladder. No definite shadowing stone, gallbladder wall thickening, or pericholecystic fluid. Negative sonographic Murphy's sign. Common bile duct: Diameter: 16 mm. Interval increase in the size of the common bile duct as well as development of mild intrahepatic biliary ductal dilatation since the prior ultrasound. Correlation with clinical exam, and LFTs recommended. MRCP  may provide better evaluation. Liver: There is heterogeneously increased liver echogenicity most commonly seen in the setting of fatty infiltration. Superimposed inflammation or fibrosis is not excluded. Clinical correlation is recommended. Portal vein is patent on color Doppler imaging with normal direction of blood flow towards the liver. Other: None. IMPRESSION: 1. Moderate amount of gallbladder sludge. No sonographic findings of acute cholecystitis. 2. Dilatation of the common bile duct, progressed since the prior ultrasound with interval development of mild intrahepatic biliary ductal dilatation. Clinical correlation is recommended. MRCP may provide better evaluation. 3. Probable fatty liver or changes of cirrhosis. Electronically Signed   By: Anner Crete M.D.   On: 09/20/2019 18:38    Labs:  CBC: Recent Labs    09/20/19 1124 09/21/19 0509 09/22/19 0539 09/23/19 0523  WBC 11.8* 11.0* 9.9 10.0  HGB 11.5* 10.8* 10.4* 10.5*  HCT 33.9* 31.9* 31.6* 32.2*  PLT 415.0 Repeated and verified X2.* 397 383 412*    COAGS: Recent Labs    09/20/19 1124  INR 1.3*    BMP: Recent Labs    09/21/19 0509 09/22/19 0539 09/23/19 0523 09/24/19 0520  NA 132* 133* 131* 133*  K 2.9* 3.3* 4.0 3.4*  CL 98 101 100 100  CO2 24 23 22 26   GLUCOSE 125* 134* 170* 146*  BUN 8 6* 15 19  CALCIUM 8.6* 8.6* 8.5* 8.4*  CREATININE <0.30* 0.38* 0.57 0.56  GFRNONAA NOT CALCULATED >60 >60 >60  GFRAA NOT CALCULATED >60 >60 >60    LIVER FUNCTION TESTS: Recent Labs    09/21/19 0509 09/22/19 0539 09/23/19 0523 09/24/19 0520  BILITOT 21.5* 21.2* 14.7* 8.7*  AST 236* 234* 198* 138*  ALT 138* 138* 137* 111*  ALKPHOS 450* 438* 438* 324*  PROT 6.8 6.8 6.9 6.1*  ALBUMIN 2.2* 2.3* 2.2* 2.1*    TUMOR MARKERS: No results for input(s): AFPTM, CEA, CA199, CHROMGRNA in the last 8760 hours.  Assessment and Plan: 64 y.o. female with past medical history significant for alcoholic cirrhosis, diabetes,  diverticulosis, hepatitis C, hypertension, obesity who was admitted to [facility] on 2/1 with elevated LFT's/obstructive jaundice and concern for malignant pancreatic mass. She is status post ERCP/EUS on 2/3 with cytology from pancreatic mass biopsy revealing adenocarcinoma.  Request now received from oncology for Port-A-Cath placement.Risks and benefits of image guided port-a-catheter placement was discussed with the patient including, but not limited to bleeding, infection, pneumothorax, or fibrin sheath development and need for additional procedures.  She is afebrile with normal WBC.  All of the patient's questions were answered, patient is agreeable to proceed. Consent signed and in chart.  Procedure scheduled for today.  Thank you for this interesting consult.  I greatly enjoyed meeting Melanie Alvarez and look forward to participating in their care.  A copy of this report was sent to the requesting provider on this date.  Electronically Signed: D. Rowe Robert, PA-C 09/24/2019, 12:14 PM   I spent a total of  30 minutes   in face to face in clinical consultation, greater than 50% of which was counseling/coordinating care for Port-A-Cath placement

## 2019-09-25 ENCOUNTER — Encounter (HOSPITAL_COMMUNITY): Payer: Self-pay | Admitting: Internal Medicine

## 2019-09-25 ENCOUNTER — Inpatient Hospital Stay (HOSPITAL_COMMUNITY): Payer: BC Managed Care – PPO | Admitting: Anesthesiology

## 2019-09-25 ENCOUNTER — Encounter (HOSPITAL_COMMUNITY)
Admission: EM | Disposition: A | Discharge status: Home / Self Care | Payer: Self-pay | Source: Home / Self Care | Attending: Family Medicine

## 2019-09-25 DIAGNOSIS — K259 Gastric ulcer, unspecified as acute or chronic, without hemorrhage or perforation: Secondary | ICD-10-CM

## 2019-09-25 DIAGNOSIS — K269 Duodenal ulcer, unspecified as acute or chronic, without hemorrhage or perforation: Secondary | ICD-10-CM

## 2019-09-25 HISTORY — PX: UPPER ESOPHAGEAL ENDOSCOPIC ULTRASOUND (EUS): SHX6562

## 2019-09-25 HISTORY — PX: FINE NEEDLE ASPIRATION: SHX5430

## 2019-09-25 HISTORY — PX: ESOPHAGOGASTRODUODENOSCOPY (EGD) WITH PROPOFOL: SHX5813

## 2019-09-25 HISTORY — PX: HEMOSTASIS CLIP PLACEMENT: SHX6857

## 2019-09-25 LAB — GLUCOSE, CAPILLARY
Glucose-Capillary: 120 mg/dL — ABNORMAL HIGH (ref 70–99)
Glucose-Capillary: 143 mg/dL — ABNORMAL HIGH (ref 70–99)
Glucose-Capillary: 145 mg/dL — ABNORMAL HIGH (ref 70–99)
Glucose-Capillary: 146 mg/dL — ABNORMAL HIGH (ref 70–99)
Glucose-Capillary: 157 mg/dL — ABNORMAL HIGH (ref 70–99)
Glucose-Capillary: 161 mg/dL — ABNORMAL HIGH (ref 70–99)
Glucose-Capillary: 195 mg/dL — ABNORMAL HIGH (ref 70–99)

## 2019-09-25 SURGERY — UPPER ESOPHAGEAL ENDOSCOPIC ULTRASOUND (EUS)
Anesthesia: Monitor Anesthesia Care

## 2019-09-25 MED ORDER — LACTATED RINGERS IV BOLUS
500.0000 mL | Freq: Once | INTRAVENOUS | Status: AC
  Administered 2019-09-25: 500 mL via INTRAVENOUS

## 2019-09-25 MED ORDER — PROPOFOL 10 MG/ML IV BOLUS
INTRAVENOUS | Status: DC | PRN
  Administered 2019-09-25: 50 mg via INTRAVENOUS
  Administered 2019-09-25 (×10): 20 mg via INTRAVENOUS
  Administered 2019-09-25: 50 mg via INTRAVENOUS
  Administered 2019-09-25 (×2): 20 mg via INTRAVENOUS
  Administered 2019-09-25 (×2): 50 mg via INTRAVENOUS

## 2019-09-25 MED ORDER — LIDOCAINE 2% (20 MG/ML) 5 ML SYRINGE
INTRAMUSCULAR | Status: DC | PRN
  Administered 2019-09-25 (×2): 40 mg via INTRAVENOUS
  Administered 2019-09-25: 20 mg via INTRAVENOUS

## 2019-09-25 MED ORDER — LACTATED RINGERS IV SOLN: INTRAVENOUS | Status: AC

## 2019-09-25 MED ORDER — PHENYLEPHRINE 40 MCG/ML (10ML) SYRINGE FOR IV PUSH (FOR BLOOD PRESSURE SUPPORT)
PREFILLED_SYRINGE | INTRAVENOUS | Status: DC | PRN
  Administered 2019-09-25: 80 ug via INTRAVENOUS

## 2019-09-25 MED ORDER — PANTOPRAZOLE SODIUM 40 MG PO TBEC
40.0000 mg | DELAYED_RELEASE_TABLET | Freq: Two times a day (BID) | ORAL | Status: DC
  Administered 2019-09-26: 40 mg via ORAL
  Filled 2019-09-25: qty 1

## 2019-09-25 MED ORDER — PANTOPRAZOLE SODIUM 40 MG IV SOLR
40.0000 mg | Freq: Two times a day (BID) | INTRAVENOUS | Status: AC
  Administered 2019-09-25 (×2): 40 mg via INTRAVENOUS
  Filled 2019-09-25 (×2): qty 40

## 2019-09-25 NOTE — Progress Notes (Signed)
Minimial discomfort post procedure with nausea. I will write her for a 500 cc LR bolus and then 125 hour for next 12 hours. Will need to be monitored for Pancreatitis after repeat EUS with [facility] that Oncology asked for additional tissue acquisition. Pain control/further supportive measures as per primary service.  Justice Britain, MD [facility] # [phone]

## 2019-09-25 NOTE — Interval H&P Note (Signed)
History and Physical Interval Note:  09/25/2019 9:23 AM  Melanie Alvarez  has presented today for surgery, with the diagnosis of Pancreatic Cancer More tissue needed for testing.  The various methods of treatment have been discussed with the patient and family. After consideration of risks, benefits and other options for treatment, the patient has consented to  Procedure(s): UPPER ESOPHAGEAL ENDOSCOPIC ULTRASOUND (EUS) (N/A) as a surgical intervention.  The patient's history has been reviewed, patient examined, no change in status, stable for surgery.  I have reviewed the patient's chart and labs.  Questions were answered to the patient's satisfaction.    The risks of EUS including bleeding, infection, aspiration pneumonia and intestinal perforation were discussed as was the possibility it may not give a definitive diagnosis.  If a biopsy of the pancreas is done as part of the EUS, there is an additional risk of pancreatitis at the rate of about 1%.  It was explained that procedure related pancreatitis is typically mild, although can be severe and even life threatening, which is why we do not perform random pancreatic biopsies and only biopsy a lesion we feel is concerning enough to warrant the risk.    [employer]

## 2019-09-25 NOTE — Anesthesia Postprocedure Evaluation (Signed)
Anesthesia Post Note  Patient: Melanie Alvarez  Procedure(s) Performed: UPPER ESOPHAGEAL ENDOSCOPIC ULTRASOUND (EUS) (N/A ) FINE NEEDLE ASPIRATION (FNA) RADIAL HEMOSTASIS CLIP PLACEMENT     Patient location during evaluation: PACU Anesthesia Type: MAC Level of consciousness: awake and alert and oriented Pain management: pain level controlled Vital Signs Assessment: post-procedure vital signs reviewed and stable Respiratory status: spontaneous breathing, nonlabored ventilation and respiratory function stable Cardiovascular status: blood pressure returned to baseline Postop Assessment: no apparent nausea or vomiting Anesthetic complications: no    Last Vitals:  Vitals:   09/25/19 0936 09/25/19 1038  BP: (!) 166/70 118/63  Pulse: 76 80  Resp: (!) 23 20  Temp: 36.8 C 37.1 C  SpO2: 98% 100%    Last Pain:  Vitals:   09/25/19 1038  TempSrc: Oral  PainSc: 0-No pain                 Brennan Bailey

## 2019-09-25 NOTE — Anesthesia Procedure Notes (Addendum)
Procedure Name: MAC Date/Time: 09/25/2019 9:48 AM Performed by: Cynda Familia, CRNA Oxygen Delivery Method: Simple face mask Placement Confirmation: breath sounds checked- equal and bilateral and positive ETCO2 Comments: Bite block by CRNA-- atraumatic -- teeth and mouth as preop

## 2019-09-25 NOTE — Progress Notes (Signed)
PROGRESS NOTE    Melanie Alvarez  KZL:[id number] DOB: [birth year] DOA: 09/20/2019 PCP: Flossie Buffy, NP    Brief Narrative: Melanie Alvarez is a 64 y.o. female with significant medical history of diabetes mellitus type 2, hypertension, obesity, hepatitis C status post treatment, cirrhosis from her alcohol abuse. Patient presented secondary to abnormal liver labs with concern for malignant pancreatic mass. GI consulted and EUS/ERCP performed on 2/3. Repeat EUS on 2/6.   Assessment & Plan:   Principal Problem:   Abnormal liver function Active Problems:   DM (diabetes mellitus) (HCC)   Hypokalemia   Hyponatremia   Pancreatic mass   Pancreatic adenocarcinoma Confirmed malignancy on pathology from biopsies obtained from recent ERCP/EUS performed 2/3. Oncology consulted and recommended repeat EUS (performed on 2/6) for more tissue samples in addition to Port-A-Cath placement (performed on 2/5) for eventual outpatient chemotherapy. -GI recommendations: Repeat EUS completed. Watch for pancreatitis -Oncology recommendations  Chronic hepatitis C History of cirrhosis. S/p treatment with Harvoni.  Hypokalemia Treated with supplementation  Gastric ulcer Duodenal Noted on repeat EUS. Clip placed to gastric ulcer. Recommendations from GI for Protonix IV x 24 hours followed by PPI BID x 8 weeks  Grade 1 varicies Noted on repeat EUS.  CBD dilation Secondary to mass effect. Stent placed as mentioned above. Bilirubin trended down  Erosive gastropathy H. Pylori negative on biopsy.  UTI Mentioned in prior notes. Urinalysis does not suggest UTI. No urine culture obtained. Zosyn discontinued.   Hyponatremia Mild. Improvement with IV fluids  Normocytic anemia Unknown etiology but possibly related to malignancy. Stable.  Essential hypertension Not well controlled. Patient is on metoprolol, amlodipine as an outpatient -Continue amlodipine and metoprolol  Diabetes mellitus,  type 2 Patient is on metformin as an outpatient -Continue SSI  Obesity Body mass index is 37.96 kg/m.  DVT prophylaxis: SCDs Code Status:   Code Status: Full Code Family Communication: None at bedside Disposition Plan: Discharge pending GI/Oncology recommendations   Consultants:   [facility]  Procedures:   ERCP (09/22/2019) Impression:               - The major papilla appeared to be bulging and                            congested as a result of the submucosal protrusion                            of the mass.                           - A single severe biliary stricture was found in                            the lower third of the main bile duct. The                            stricture was malignant appearing.                           - The upper third of the main bile duct, middle  third of the main bile duct and left and right                            hepatic ducts and all intrahepatic branches were                            severely dilated, secondary to the stricture.                           - A biliary sphincterotomy was performed.                           - Cells for cytology obtained in the lower third of                            the main duct stricture.                           - One uncovered metal biliary stent was placed into                            the common bile duct. This was dilated to ensure                            stent expansion.  Recommendation:           - The patient will be observed post-procedure,                            until all discharge criteria are met.                           - Return patient to hospital ward for ongoing care.                           - Check liver enzymes (AST, ALT, alkaline                            phosphatase, bilirubin) in the morning as well as a                            CA19-9.                           - Observe patient's clinical course.                           -  Await cytology results.                           - Watch for pancreatitis, bleeding, perforation,                            and cholangitis.                           -  Would consider CT-Chest to complete staging for                            patient.                           - Oncology referral as inpatient vs outpatient                            based on patient status in the next 24-48 hours                            should be obtained.                           - Hold Chemical VTE PPx for 48 hours to decrease                            risk of post-sphincterotomy bleeding if possible -                            SCDs, TEDs OK as well as UOB.  Upper EUS (09/22/2019) Impression:               EGD Impression:                           - No gross lesions in esophagus proximally. Grade I                            esophageal varices distally. Z-line irregular, 38                            cm from the incisors.                           - Erosive gastropathy with no bleeding and no                            stigmata of recent bleeding. No other gross lesions                            in the stomach. Biopsied for HP.                           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 Biopsied superficially.                           EUS Impression:                           -  A mass was identified in the pancreatic head.                            Cytology results are pending. However, the                            endosonographic appearance is highly suspicious for                            adenocarcinoma. This was staged T2 N1 Mx by EUS                            criteria. However, based on MRI imaging/size on the                            MRICP, this would be staged T3 N1 Mx. The staging                            applies if malignancy is  confirmed. Fine needle                            biopsy performed.                           - The pancreatic duct had intraductal stones in the                            pancreatic head and genu of the pancreas. The                            pancreatic duct had a dilated endosonographic                            appearance in the neck/body which tapered to the                            tail but then dilated again without discrete                            mass/lesion/stricture in the tail.                           - Hyperechoic material consistent with sludge was                            visualized endosonographically in the gallbladder.                           - There was dilation in the common bile duct and in                            the common hepatic duct.                           -  A few malignant-appearing lymph nodes were                            visualized in the gastrohepatic ligament (level 18)                            and porta hepatis region. Cytology results are                            pending. However, the endosonographic appearance is                            suspicious for metastatic pancreatic                            adenocarcinoma. Fine needle biopsy performed.  Recommendation:           - Proceed to scheduled ERCP.                           - Follow up Cytology and Pathology.  UPPER EUS (2/6/20201) Impression:               EGD Impression:                           - Grade 1 small varices noted in esophagus but no                            other gross lesions.                           - Non-bleeding gastric ulcer with a flat pigmented                            spot (Forrest Class IIc). Clips (MR conditional)                            were placed for additional hemostasis.                           - No other gross lesions in the stomach.                           - Non-bleeding duodenal ulcer with a clean ulcer                             base (Forrest Class III).                           - Duodenal deformity.                           - Metal biliary stent at ampulla.                           EUS Impression:                           -  A mass was identified in the pancreatic head.                            Fine needle biopsy performed for additional tissue. Moderate Sedation:      Not Applicable - Patient had care per Anesthesia. Recommendation:           - The patient will be observed post-procedure,                            until all discharge criteria are met.                           - Return patient to hospital ward for ongoing care.                           - Observe patient's clinical course.                           - Monitor for signs/symptoms of pancreatitis,                            bleeding, perforation, and infection. If issues                            please call our number to get further assistance as                            needed.                           - Trend Hgb/Hct                           - IV PPI BID x 24 hours and then transition to PO                            PPI BID x 8-weeks.  Antimicrobials:  Zosyn    Subjective: Some LUQ pain that she attributes to gas  Objective: Vitals:   09/25/19 1038 09/25/19 1040 09/25/19 1050 09/25/19 1137  BP: 118/63 (!) 113/56 139/71 (!) 146/65  Pulse: 80 77 79 75  Resp: 20 (!) 23 (!) 22 20  Temp: 98.7 F (37.1 C)   97.9 F (36.6 C)  TempSrc: Oral   Oral  SpO2: 100% 100% 98%   Weight:      Height:        Intake/Output Summary (Last 24 hours) at 09/25/2019 1209 Last data filed at 09/25/2019 1030 Gross per 24 hour  Intake 1820 ml  Output 0 ml  Net 1820 ml   Filed Weights   09/22/19 0554 09/23/19 0500 09/25/19 0936  Weight: 97.3 kg 97.2 kg 97.2 kg    Examination:  General exam: Appears calm and comfortable Eyes: scleral icterus Respiratory system: Clear to auscultation. Respiratory effort normal. Cardiovascular system: S1  & S2 heard, RRR. No murmurs, rubs, gallops or clicks. Gastrointestinal system: Abdomen is distended, soft and nontender. No organomegaly or masses felt. Normal bowel sounds heard. Central nervous system: Alert and oriented.  No focal neurological deficits. Extremities: No edema. No calf tenderness Skin: No cyanosis. No rashes Psychiatry: Judgement and insight appear normal. Mood & affect appropriate.     Data Reviewed: I have personally reviewed following labs and imaging studies  CBC: Recent Labs  Lab 09/20/19 1124 09/21/19 0509 09/22/19 0539 09/23/19 0523  WBC 11.8* 11.0* 9.9 10.0  NEUTROABS 7.8*  --   --  8.5*  HGB 11.5* 10.8* 10.4* 10.5*  HCT 33.9* 31.9* 31.6* 32.2*  MCV 89.1 88.6 90.0 90.4  PLT 415.0 Repeated and verified X2.* 397 383 382*   Basic Metabolic Panel: Recent Labs  Lab 09/20/19 1124 09/21/19 0509 09/22/19 0539 09/23/19 0523 09/24/19 0520  NA 129* 132* 133* 131* 133*  K 3.1* 2.9* 3.3* 4.0 3.4*  CL 92* 98 101 100 100  CO2 '26 24 23 22 26  ' GLUCOSE 111* 125* 134* 170* 146*  BUN 11 8 6* 15 19  CREATININE 0.98 <0.30* 0.38* 0.57 0.56  CALCIUM 9.4 8.6* 8.6* 8.5* 8.4*  MG  --   --  1.8  --   --    GFR: Estimated Creatinine Clearance: 80.9 mL/min (by C-G formula based on SCr of 0.56 mg/dL). Liver Function Tests: Recent Labs  Lab 09/20/19 1124 09/21/19 0509 09/22/19 0539 09/23/19 0523 09/24/19 0520  AST 248* 236* 234* 198* 138*  ALT 157* 138* 138* 137* 111*  ALKPHOS 601* 450* 438* 438* 324*  BILITOT 26.6*  23.7* 21.5* 21.2* 14.7* 8.7*  PROT 7.4 6.8 6.8 6.9 6.1*  ALBUMIN 3.4* 2.2* 2.3* 2.2* 2.1*   Recent Labs  Lab 09/23/19 0523 09/24/19 0520  LIPASE 310* 266*   No results for input(s): AMMONIA in the last 168 hours. Coagulation Profile: Recent Labs  Lab 09/20/19 1124  INR 1.3*   Cardiac Enzymes: Recent Labs  Lab 09/21/19 0509  CKTOTAL 78  CKMB 1.6   BNP (last 3 results) No results for input(s): PROBNP in the last 8760  hours. HbA1C: No results for input(s): HGBA1C in the last 72 hours. CBG: Recent Labs  Lab 09/25/19 0008 09/25/19 0338 09/25/19 0740 09/25/19 1048 09/25/19 1153  GLUCAP 157* 145* 161* 120* 146*   Lipid Profile: No results for input(s): CHOL, HDL, LDLCALC, TRIG, CHOLHDL, LDLDIRECT in the last 72 hours. Thyroid Function Tests: No results for input(s): TSH, T4TOTAL, FREET4, T3FREE, THYROIDAB in the last 72 hours. Anemia Panel: No results for input(s): VITAMINB12, FOLATE, FERRITIN, TIBC, IRON, RETICCTPCT in the last 72 hours. Sepsis Labs: No results for input(s): PROCALCITON, LATICACIDVEN in the last 168 hours.  Recent Results (from the past 240 hour(s))  SARS CORONAVIRUS 2 (TAT 6-24 HRS) Nasopharyngeal Nasopharyngeal Swab     Status: None   Collection Time: 09/20/19  9:35 PM   Specimen: Nasopharyngeal Swab  Result Value Ref Range Status   SARS Coronavirus 2 NEGATIVE NEGATIVE Final    Comment: (NOTE) SARS-CoV-2 target nucleic acids are NOT DETECTED. The SARS-CoV-2 RNA is generally detectable in upper and lower respiratory specimens during the acute phase of infection. Negative results do not preclude SARS-CoV-2 infection, do not rule out co-infections with other pathogens, and should not be used as the sole basis for treatment or other patient management decisions. Negative results must be combined with clinical observations, patient history, and epidemiological information. The expected result is Negative. Fact Sheet for Patients: SugarRoll.be Fact Sheet for Healthcare Providers: https://www.woods-mathews.com/ This test is not yet approved or cleared by the [place] FDA and  has been authorized for detection and/or diagnosis of SARS-CoV-2  by FDA under an Emergency Use Authorization (EUA). This EUA will remain  in effect (meaning this test can be used) for the duration of the COVID-19 declaration under Section 56 4(b)(1) of the  Act, 21 U.S.C. section 360bbb-3(b)(1), unless the authorization is terminated or revoked sooner. Performed at [facility], [place] [address], [place], [place] [address]          Radiology Studies: CT CHEST W CONTRAST  Result Date: 09/23/2019 CLINICAL DATA:  64 year old female with pancreatic cancer staging. EXAM: CT CHEST WITH CONTRAST TECHNIQUE: Multidetector CT imaging of the chest was performed during intravenous contrast administration. CONTRAST:  64m OMNIPAQUE IOHEXOL 300 MG/ML  SOLN COMPARISON:  Abdominal MRI dated 09/20/2019 and ultrasound dated 09/20/2019 FINDINGS: Cardiovascular: There is no cardiomegaly or pericardial effusion. Coronary vascular calcifications primarily involving the RCA and left circumflex artery. There is mild atherosclerotic calcification of the thoracic aorta. No aneurysmal dilatation or dissection. The origins of the great vessels of the aortic arch appear patent as visualized. The central pulmonary arteries are patent. Mediastinum/Nodes: There is no hilar or mediastinal adenopathy. Mildly rounded and enlarged lymph node in the right cardiophrenic angle (series 2, image 97) measures 11 mm in diameter. The esophagus and the thyroid gland are grossly unremarkable. No mediastinal fluid collection. Lungs/Pleura: The lungs are clear. There is no pleural effusion or pneumothorax. The central airways are patent. Upper Abdomen: Pneumobilia and partially visualized biliary stent. There is a 3.2 x 1.7 cm low attenuating lesion in the body of the pancreas. Several mildly enlarged gastrohepatic lymph nodes including a 12 mm mildly rounded lymph node (series 2, image 126). Musculoskeletal: Mild degenerative changes of the spine. No acute osseous pathology. IMPRESSION: 1. No acute intrathoracic pathology. No CT evidence of intrathoracic metastatic disease. A single mildly enlarged and rounded lymph node at the right cardiophrenic angle measures 11 mm in diameter. 2. Partially  visualized CBD stent and associated pneumobilia. 3. A low attenuating mass in the distal body of the pancreas and several mildly enlarged gastrohepatic lymph nodes. Electronically Signed   By: AAnner CreteM.D.   On: 09/23/2019 19:31   IR IMAGING GUIDED PORT INSERTION  Result Date: 09/24/2019 CLINICAL DATA:  METASTATIC PANCREAS CARCINOMA EXAM: RIGHT INTERNAL JUGULAR SINGLE LUMEN POWER PORT CATHETER INSERTION Date:  09/24/2019 09/24/2019 4:25 pm Radiologist:  M. TDaryll Brod MD Guidance:  Ultrasound and fluoroscopic MEDICATIONS: Ancef 2 g; The antibiotic was administered within an appropriate time interval prior to skin puncture. ANESTHESIA/SEDATION: Versed 2.0 mg IV; Fentanyl 100 mcg IV; Moderate Sedation Time:  28 minutes The patient was continuously monitored during the procedure by the interventional radiology nurse under my direct supervision. FLUOROSCOPY TIME:  0 minutes, 48 seconds (16 mGy) COMPLICATIONS: None immediate. CONTRAST:  None PROCEDURE: Informed consent was obtained from the patient following explanation of the procedure, risks, benefits and alternatives. The patient understands, agrees and consents for the procedure. All questions were addressed. A time out was performed. Maximal barrier sterile technique utilized including caps, mask, sterile gowns, sterile gloves, large sterile drape, hand hygiene, and 2% chlorhexidine scrub. Under sterile conditions and local anesthesia, right internal jugular micropuncture venous access was performed. Access was performed with ultrasound. Images were obtained for documentation of the patent right internal jugular vein. A guide wire was inserted followed by a transitional dilator. This allowed insertion of a guide wire and catheter into the IVC. Measurements were obtained from the SVC / RA junction back to the right IJ venotomy site. In the right  infraclavicular chest, a subcutaneous pocket was created over the second anterior rib. This was done under  sterile conditions and local anesthesia. 1% lidocaine with epinephrine was utilized for this. A 2.5 cm incision was made in the skin. Blunt dissection was performed to create a subcutaneous pocket over the right pectoralis major muscle. The pocket was flushed with saline vigorously. There was adequate hemostasis. The port catheter was assembled and checked for leakage. The port catheter was secured in the pocket with two retention sutures. The tubing was tunneled subcutaneously to the right venotomy site and inserted into the SVC/RA junction through a valved peel-away sheath. Position was confirmed with fluoroscopy. Images were obtained for documentation. The patient tolerated the procedure well. No immediate complications. Incisions were closed in a two layer fashion with 4 - 0 Vicryl suture. Dermabond was applied to the skin. The port catheter was accessed, blood was aspirated followed by saline and heparin flushes. Needle was removed. A dry sterile dressing was applied. IMPRESSION: Ultrasound and fluoroscopically guided right internal jugular single lumen power port catheter insertion. Tip in the SVC/RA junction. Catheter ready for use. Electronically Signed   By: Jerilynn Mages.  Shick M.D.   On: 09/24/2019 16:32        Scheduled Meds: . amLODipine  10 mg Oral QHS  . Chlorhexidine Gluconate Cloth  6 each Topical Daily  . feeding supplement  1 Container Oral TID BM  . indomethacin  100 mg Rectal Once  . insulin aspart  0-9 Units Subcutaneous Q4H  . irbesartan  37.5 mg Oral Daily  . metoprolol succinate  25 mg Oral Daily  . [START ON 09/26/2019] pantoprazole  40 mg Oral BID  . pantoprazole (PROTONIX) IV  40 mg Intravenous Q12H   Continuous Infusions: . lactated ringers       LOS: 5 days     Cordelia Poche, MD [facility] 09/25/2019, 12:09 PM  If 7PM-7AM, please contact night-coverage [email or web address]

## 2019-09-25 NOTE — Op Note (Signed)
[facility] Patient Name: Melanie Alvarez Procedure Date: 09/25/2019 MRN: [id number] Attending MD: Justice Britain , MD Date of Birth: Jul 29, 1956 CSN: [id number] Age: 64 Admit Type: Inpatient Procedure:                Upper EUS Indications:              Pancreatic adenocarcinoma - Oncology requests                            additional sampling for further molecular testing Providers:                Justice Britain, MD, Angus Seller, William Dalton, Technician, Theodora Blow, Technician Referring MD:             Rudell Cobb. Ennever MD, MD, [facility] Medicines:                Monitored Anesthesia Care Complications:            No immediate complications. Estimated Blood Loss:     Estimated blood loss was minimal. Procedure:                Pre-Anesthesia Assessment:                           - Prior to the procedure, a History and Physical                            was performed, and patient medications and                            allergies were reviewed. The patient's tolerance of                            previous anesthesia was also reviewed. The risks                            and benefits of the procedure and the sedation                            options and risks were discussed with the patient.                            All questions were answered, and informed consent                            was obtained. Prior Anticoagulants: The patient has                            taken no previous anticoagulant or antiplatelet                            agents. ASA Grade Assessment: III - A patient with  severe systemic disease. After reviewing the risks                            and benefits, the patient was deemed in                            satisfactory condition to undergo the procedure.                           After obtaining informed consent, the endoscope was   passed under direct vision. Throughout the                            procedure, the patient's blood pressure, pulse, and                            oxygen saturations were monitored continuously. The                            [id number]-[id number] ([id number]) Olympus gastroscope was                            introduced through the mouth, and advanced to the                            second part of duodenum. The GF-[id number] ([id number])                            Olympus Linear EUS was introduced through the                            mouth, and advanced to the duodenum for ultrasound                            examination. The upper EUS was accomplished without                            difficulty. The patient tolerated the procedure. Scope In: Scope Out: Findings:      ENDOSCOPIC FINDING: :      Grade 1 small varices noted but no other gross lesions were noted in the       entire esophagus.      One non-bleeding superficial gastric ulcer with a flat pigmented spot       (Forrest Class IIc) was found in the cardia. The lesion was 7 mm in       largest dimension. This is from the previously biopsied LN during recent       EUS. For additional hemostasis, two hemostatic clips were successfully       placed (MR conditional). There was no bleeding during, or at the end, of       the procedure.      No other gross lesions were noted in the entire examined stomach.      One non-bleeding superficial duodenal ulcer with a clean ulcer base       (Forrest Class III) was found in the duodenal bulb -  from previous FNB       of pancreatic mass at recent EUS. The lesion was 5 mm in largest       dimension.      A moderate extrinsic deformity was found in the D1/D2 angle and the       second portion of the duodenum.      A previously placed metal biliary stent was seen at the major papilla.      ENDOSONOGRAPHIC FINDING: :      An irregular mass was identified in the pancreatic head - consistent       with recently  biopsied lesion. The mass was hypoechoic. The mass       measured 27 mm by 25 mm in maximal cross-sectional diameter. The outer       margins were irregular. For our Oncology colleagues who asked for       additional pathology specimen, fine needle biopsy was performed. Color       Doppler imaging was utilized prior to needle puncture to confirm a lack       of significant vascular structures within the needle path. Four passes       were made with the 22 gauge ultrasound core biopsy needle using a       transduodenal approach. A visible core of tissue was obtained. A       preliminary cytologic examination was performed. Final cytology results       are pending. Impression:               EGD Impression:                           - Grade 1 small varices noted in esophagus but no                            other gross lesions.                           - Non-bleeding gastric ulcer with a flat pigmented                            spot (Forrest Class IIc). Clips (MR conditional)                            were placed for additional hemostasis.                           - No other gross lesions in the stomach.                           - Non-bleeding duodenal ulcer with a clean ulcer                            base (Forrest Class III).                           - Duodenal deformity.                           - Metal biliary stent at ampulla.  EUS Impression:                           - A mass was identified in the pancreatic head.                            Fine needle biopsy performed for additional tissue. Moderate Sedation:      Not Applicable - Patient had care per Anesthesia. Recommendation:           - The patient will be observed post-procedure,                            until all discharge criteria are met.                           - Return patient to hospital ward for ongoing care.                           - Observe patient's clinical course.                            - Monitor for signs/symptoms of pancreatitis,                            bleeding, perforation, and infection. If issues                            please call our number to get further assistance as                            needed.                           - Trend Hgb/Hct                           - IV PPI BID x 24 hours and then transition to PO                            PPI BID x 8-weeks.                           - The findings and recommendations were discussed                            with the patient.                           - The findings and recommendations were discussed                            with the referring physician. Procedure Code(s):        --- Professional ---                           [phone], Esophagogastroduodenoscopy, flexible,  transoral; with transendoscopic ultrasound-guided                            intramural or transmural fine needle                            aspiration/biopsy(s), (includes endoscopic                            ultrasound examination limited to the esophagus,                            stomach or duodenum, and adjacent structures) Diagnosis Code(s):        --- Professional ---                           K25.9, Gastric ulcer, unspecified as acute or                            chronic, without hemorrhage or perforation                           K26.9, Duodenal ulcer, unspecified as acute or                            chronic, without hemorrhage or perforation                           K31.89, Other diseases of stomach and duodenum                           K86.89, Other specified diseases of pancreas                           C25.9, Malignant neoplasm of pancreas, unspecified CPT copyright 2019 [employer]. All rights reserved. The codes documented in this report are preliminary and upon coder review may  be revised to meet current compliance requirements. Justice Britain,  MD 09/25/2019 10:47:51 AM Number of Addenda: 0

## 2019-09-25 NOTE — Progress Notes (Signed)
EUS consent signed and placed in patient's chart

## 2019-09-25 NOTE — Transfer of Care (Signed)
Immediate Anesthesia Transfer of Care Note  Patient: Melanie Alvarez  Procedure(s) Performed: UPPER ESOPHAGEAL ENDOSCOPIC ULTRASOUND (EUS) (N/A ) FINE NEEDLE ASPIRATION (FNA) RADIAL HEMOSTASIS CLIP PLACEMENT  Patient Location: PACU and Endoscopy Unit  Anesthesia Type:MAC  Level of Consciousness: awake and alert   Airway & Oxygen Therapy: Patient Spontanous Breathing and Patient connected to face mask oxygen  Post-op Assessment: Report given to RN and Post -op Vital signs reviewed and stable  Post vital signs: Reviewed and stable  Last Vitals:  Vitals Value Taken Time  BP 118/63 09/25/19 1038  Temp    Pulse 77 09/25/19 1040  Resp 23 09/25/19 1040  SpO2 100 % 09/25/19 1040  Vitals shown include unvalidated device data.  Last Pain:  Vitals:   09/25/19 0936  TempSrc: Oral  PainSc: 0-No pain         Complications: No apparent anesthesia complications

## 2019-09-26 DIAGNOSIS — C259 Malignant neoplasm of pancreas, unspecified: Secondary | ICD-10-CM

## 2019-09-26 LAB — GLUCOSE, CAPILLARY
Glucose-Capillary: 137 mg/dL — ABNORMAL HIGH (ref 70–99)
Glucose-Capillary: 149 mg/dL — ABNORMAL HIGH (ref 70–99)
Glucose-Capillary: 174 mg/dL — ABNORMAL HIGH (ref 70–99)

## 2019-09-26 MED ORDER — PANTOPRAZOLE SODIUM 40 MG PO TBEC: 40.0000 mg | DELAYED_RELEASE_TABLET | Freq: Two times a day (BID) | ORAL | 0 refills | Status: DC

## 2019-09-26 NOTE — Discharge Summary (Signed)
  Physician Discharge Summary  Melanie Alvarez MRN:[id number] DOB: 07/08/1956 DOA: 09/20/2019  PCP: Nche, Charlotte Lum, NP  Admit date: 09/20/2019 Discharge date: 09/26/2019  Admitted From: Home Disposition: Home  Recommendations for Outpatient Follow-up:  1. Follow up with PCP in 1 week 2. Follow up with oncology 3. Follow up with Gastroenterology 4. Please obtain CMP in 2 weeks 5. Please follow up on the following pending results: None  Home Health: None Equipment/Devices: None  Discharge Condition: Stable CODE STATUS: Full code Diet recommendation: Low fat diet   Brief/Interim Summary:  Admission HPI written by James Kim, MD    HPI:   Melanie Alvarez  is a 64 y.o. female,w hypertension, Dm2, Obeisity, Hepatitis C cirrhosis, former ETOH use, presents with abnormal liver function.  Pt was sent by her physician to ER for abnormal liver function.  Pt notes that her urine has been dark for the past 3 weeks.  Pt used to drink 6 pack of beer per day, but quit about 3months ago per patient. Pt notes weight loss.   Pt denies fever, chills, n/v, abd pain, diarrhea, brbpr, black stool.  Pt denies any recent change in medication.   Hospital course:  Pancreatic adenocarcinoma Confirmed malignancy on pathology from biopsies obtained from recent ERCP/EUS performed 2/3. Oncology consulted and recommended repeat EUS (performed on 2/6) for more tissue samples in addition to Port-A-Cath placement (performed on 2/5) for eventual outpatient chemotherapy. No evidence of pancreatitis prior to discharge. Patient to follow-up with oncology as an outpatient.   Chronic hepatitis C History of cirrhosis. S/p prior treatment with Harvoni. HCV quantitative is negative.  Hypokalemia Treated with supplementation  Gastric ulcer Duodenal Erosive gastropathy H. Pylori negative on biopsy. Noted on repeat EUS. Clip placed to gastric ulcer. Recommendations from GI for Protonix IV x 24 hours  followed by PPI BID x 8 weeks. Patient will follow-up with GI.  Grade 1 varicies Noted on repeat EUS. Patient will follow-up with GI.  CBD dilation Secondary to mass effect. Stent placed as mentioned above. Bilirubin trended down.  UTI Mentioned in prior notes. Urinalysis does not suggest UTI. No urine culture obtained. Zosyn discontinued.   Hyponatremia Mild. Improvement with IV fluids  Normocytic anemia Unknown etiology but possibly related to malignancy. Stable.  Essential hypertension  Patient is on metoprolol, amlodipine as an outpatient. Continue amlodipine and metoprolol  Diabetes mellitus, type 2 Patient is on metformin as an outpatient. Resume regimen  Obesity Body mass index is 37.96 kg/m.  Discharge Diagnoses:  Principal Problem:   Pancreatic cancer (HCC) Active Problems:   DM (diabetes mellitus) (HCC)   Hypokalemia   Abnormal liver function   Hyponatremia    Discharge Instructions   Allergies as of 09/26/2019      Reactions   Lisinopril Swelling   Facial and upper lip      Medication List    STOP taking these medications   atorvastatin 20 MG tablet Commonly known as: LIPITOR   nystatin-triamcinolone cream Commonly known as: MYCOLOG II     TAKE these medications   allopurinol 100 MG tablet Commonly known as: ZYLOPRIM Take 1 tablet (100 mg total) by mouth 3 (three) times daily after meals.   amLODipine 10 MG tablet Commonly known as: NORVASC Take 1 tablet (10 mg total) by mouth at bedtime.   cholecalciferol 10 MCG (400 UNIT) Tabs tablet Commonly known as: VITAMIN D3 Take 400 Units by mouth daily.   glucose blood test strip Test blood glucose once daily.   One Touch-Delica test strips   Lecithin 1200 MG Caps Take by mouth.   metFORMIN 500 MG tablet Commonly known as: GLUCOPHAGE Take 1 tablet (500 mg total) by mouth 2 (two) times daily with a meal.   metoprolol succinate 25 MG 24 hr tablet Commonly known as: TOPROL-XL Take 1  tablet (25 mg total) by mouth daily. Take with or immediately following a meal.   Milk Thistle 1000 MG Caps Take by mouth.   olmesartan 5 MG tablet Commonly known as: BENICAR Take 1 tablet (5 mg total) by mouth daily.   OVER THE COUNTER MEDICATION Curamin for pain relive.   pantoprazole 40 MG tablet Commonly known as: PROTONIX Take 1 tablet (40 mg total) by mouth 2 (two) times daily.       Allergies  Allergen Reactions  . Lisinopril Swelling    Facial and upper lip    Consultations:  Gastroenterology  Oncology   Procedures/Studies: CT CHEST W CONTRAST  Result Date: 09/23/2019 CLINICAL DATA:  64-year-old female with pancreatic cancer staging. EXAM: CT CHEST WITH CONTRAST TECHNIQUE: Multidetector CT imaging of the chest was performed during intravenous contrast administration. CONTRAST:  75mL OMNIPAQUE IOHEXOL 300 MG/ML  SOLN COMPARISON:  Abdominal MRI dated 09/20/2019 and ultrasound dated 09/20/2019 FINDINGS: Cardiovascular: There is no cardiomegaly or pericardial effusion. Coronary vascular calcifications primarily involving the RCA and left circumflex artery. There is mild atherosclerotic calcification of the thoracic aorta. No aneurysmal dilatation or dissection. The origins of the great vessels of the aortic arch appear patent as visualized. The central pulmonary arteries are patent. Mediastinum/Nodes: There is no hilar or mediastinal adenopathy. Mildly rounded and enlarged lymph node in the right cardiophrenic angle (series 2, image 97) measures 11 mm in diameter. The esophagus and the thyroid gland are grossly unremarkable. No mediastinal fluid collection. Lungs/Pleura: The lungs are clear. There is no pleural effusion or pneumothorax. The central airways are patent. Upper Abdomen: Pneumobilia and partially visualized biliary stent. There is a 3.2 x 1.7 cm low attenuating lesion in the body of the pancreas. Several mildly enlarged gastrohepatic lymph nodes including a 12 mm  mildly rounded lymph node (series 2, image 126). Musculoskeletal: Mild degenerative changes of the spine. No acute osseous pathology. IMPRESSION: 1. No acute intrathoracic pathology. No CT evidence of intrathoracic metastatic disease. A single mildly enlarged and rounded lymph node at the right cardiophrenic angle measures 11 mm in diameter. 2. Partially visualized CBD stent and associated pneumobilia. 3. A low attenuating mass in the distal body of the pancreas and several mildly enlarged gastrohepatic lymph nodes. Electronically Signed   By: Arash  Radparvar M.D.   On: 09/23/2019 19:31   DG ERCP BILIARY & PANCREATIC DUCTS  Result Date: 09/22/2019 CLINICAL DATA:  63-year-old female with a history of jaundice EXAM: ERCP TECHNIQUE: Multiple spot images obtained with the fluoroscopic device and submitted for interpretation post-procedure. FLUOROSCOPY TIME:  Fluoroscopy Time:  4 minutes 24 seconds COMPARISON:  None. FINDINGS: Limited intraoperative fluoroscopic spot images during ERCP. Initial image demonstrates endoscope projecting over the upper abdomen. Vague calcifications/densities of the upper abdomen may reflect calcifications of the pancreatic head. Subsequently there is cannulation of the ampulla and retrograde infusion of contrast with partial opacification of the extrahepatic biliary ductal system. There is then placement of a metallic biliary stent and PTA biliary stricture. IMPRESSION: Limited images of ERCP demonstrates treatment of biliary stricture with stenting and PTA. Please refer to the dictated operative report for full details of intraoperative findings and procedure. Electronically Signed     By: Jaime  Wagner D.O.   On: 09/22/2019 16:26   MR ABDOMEN MRCP W WO CONTAST  Result Date: 09/20/2019 CLINICAL DATA:  Jaundice. EXAM: MRI ABDOMEN WITHOUT AND WITH CONTRAST (INCLUDING MRCP) TECHNIQUE: Multiplanar multisequence MR imaging of the abdomen was performed both before and after the  administration of intravenous contrast. Heavily T2-weighted images of the biliary and pancreatic ducts were obtained, and three-dimensional MRCP images were rendered by post processing. CONTRAST:  10mL GADAVIST GADOBUTROL 1 MMOL/ML IV SOLN COMPARISON:  Abdominal sonogram of 09/20/2019 FINDINGS: Lower chest: Limited assessment of the lower chest shows no effusion or consolidation. Study limited by patient body habitus overall. 13 mm pre pericardial lymph node is nonspecific. There is also a suggestion of irregularity of soft tissues in the left left supraclavicular region. This could represent nodal disease given findings below. Hepatobiliary: Signs of irregular filling defect in the duodenum, associated with biliary and pancreatic ductal dilation and pancreatic atrophy. Common bile duct is largest 15 mm with marked distension of the intrahepatic biliary tree. Common bile duct with tapered narrowing to the level of the ampulla. Sludge in the dependent gallbladder and small stones. Also likely with sludge in the common bile duct and dependent low signal intensity on reconstructed MRCP sequences that suggests either small stones or biliary sludge in the dependent portion of the distal common bile duct. No visible intrahepatic calculi. Pancreas: Marked dilation of dorsal pancreatic duct with signs of pancreatic atrophy in intraductal calculi suggested on T2 and MRCP sequences some as large is a cm and mainly present near the ductal confluence. Ampullary filling defect as outlined above. Ductal caliber in maximum axial dimension 14 mm of the pancreatic duct. In addition to extension into the ampulla there is abnormal soft tissue involving the pancreatic head best seen on subtraction images, image 74 of postcontrast data measuring approximately 4.4 x 3.2 cm in greatest axial dimension including extension into the lumen of the duodenum. Spleen:  Spleen is normal size without suspicious lesion. Adrenals/Urinary Tract:  Adrenal glands are normal. Small part solid and enhancing renal lesion arises from the lower pole of the right kidney measuring 11 x 9 mm. No signs of hydronephrosis. Stomach/Bowel: No sign of bowel obstruction. Masslike area in the region of the ampulla filling the ampullary portion of the duodenum may measure as large as 3.5 x 3.1 cm in greatest axial dimension and results in ductal obstruction of both pancreatic and biliary duct. Lobular contour of the pancreatic tail with heterogeneity, question of small lesion in this location as well 1.3 cm (image 49, venous phase data set, postcontrast images. Vascular/Lymphatic: Bulky periportal lymph nodes suspicious for neoplastic/metastatic lymph nodes based on location in the appearance of the duodenum. Also with enlarged hepatic gastric lymph nodes. (Image 59, post-contrast images) 1.4 cm portacaval lymph node (Image 46, post-contrast images 9 mm hepatic gastric lymph node. Other: Portal vein and SMV are patent. SMA is patent. No signs of soft tissue in case mint of vascular structures in the upper abdomen. Musculoskeletal: No suspicious bone lesions identified. IMPRESSION: 1. Signs of pancreatic/ampullary mass with extension into duodenum and obstruction of biliary and pancreatic ducts. 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 Endoscopic assessment is suggested. 2. Signs of pancreatic atrophy with lobular area in the pancreatic tail the raise the question of second small lesion versus is heterogeneous pancreas with signs of prior inflammation. 3. Biliary and pancreatic ductal obstruction with sludge and/or small stones layering dependently in the common bile duct and   intraductal calculi suspected in the dilated pancreatic duct near the biliary ductal and pancreatic ductal confluence. Some calculi may be as large as 1 cm. 4. Signs of nodal enlargement in the upper abdomen and even in lower chest raising the question of nodal  involvement. 5. Query left supraclavicular nodal enlargement, chest imaging may be helpful given findings above. 6. Small solid and enhancing lesion in the lower pole of the right kidney. Recommend attention on follow-up imaging. This recommendation follows ACR consensus guidelines: White Paper of the ACR Incidental Findings Committee II on Vascular Findings. J Am Coll Radiol 2013; 10:789-794. 7. These results will be called to the ordering clinician or representative by the Radiologist Assistant, and communication documented in the PACS or zVision Dashboard. Electronically Signed   By: Geoffrey  Wile M.D.   On: 09/20/2019 22:10   IR IMAGING GUIDED PORT INSERTION  Result Date: 09/24/2019 CLINICAL DATA:  METASTATIC PANCREAS CARCINOMA EXAM: RIGHT INTERNAL JUGULAR SINGLE LUMEN POWER PORT CATHETER INSERTION Date:  09/24/2019 09/24/2019 4:25 pm Radiologist:  M. Trevor Shick, MD Guidance:  Ultrasound and fluoroscopic MEDICATIONS: Ancef 2 g; The antibiotic was administered within an appropriate time interval prior to skin puncture. ANESTHESIA/SEDATION: Versed 2.0 mg IV; Fentanyl 100 mcg IV; Moderate Sedation Time:  28 minutes The patient was continuously monitored during the procedure by the interventional radiology nurse under my direct supervision. FLUOROSCOPY TIME:  0 minutes, 48 seconds (16 mGy) COMPLICATIONS: None immediate. CONTRAST:  None PROCEDURE: Informed consent was obtained from the patient following explanation of the procedure, risks, benefits and alternatives. The patient understands, agrees and consents for the procedure. All questions were addressed. A time out was performed. Maximal barrier sterile technique utilized including caps, mask, sterile gowns, sterile gloves, large sterile drape, hand hygiene, and 2% chlorhexidine scrub. Under sterile conditions and local anesthesia, right internal jugular micropuncture venous access was performed. Access was performed with ultrasound. Images were obtained for  documentation of the patent right internal jugular vein. A guide wire was inserted followed by a transitional dilator. This allowed insertion of a guide wire and catheter into the IVC. Measurements were obtained from the SVC / RA junction back to the right IJ venotomy site. In the right infraclavicular chest, a subcutaneous pocket was created over the second anterior rib. This was done under sterile conditions and local anesthesia. 1% lidocaine with epinephrine was utilized for this. A 2.5 cm incision was made in the skin. Blunt dissection was performed to create a subcutaneous pocket over the right pectoralis major muscle. The pocket was flushed with saline vigorously. There was adequate hemostasis. The port catheter was assembled and checked for leakage. The port catheter was secured in the pocket with two retention sutures. The tubing was tunneled subcutaneously to the right venotomy site and inserted into the SVC/RA junction through a valved peel-away sheath. Position was confirmed with fluoroscopy. Images were obtained for documentation. The patient tolerated the procedure well. No immediate complications. Incisions were closed in a two layer fashion with 4 - 0 Vicryl suture. Dermabond was applied to the skin. The port catheter was accessed, blood was aspirated followed by saline and heparin flushes. Needle was removed. A dry sterile dressing was applied. IMPRESSION: Ultrasound and fluoroscopically guided right internal jugular single lumen power port catheter insertion. Tip in the SVC/RA junction. Catheter ready for use. Electronically Signed   By: M.  Shick M.D.   On: 09/24/2019 16:32   US Abdomen Limited RUQ  Result Date: 09/20/2019 CLINICAL DATA:  63-year-old female with   elevated bilirubin. EXAM: ULTRASOUND ABDOMEN LIMITED RIGHT UPPER QUADRANT COMPARISON:  Ultrasound dated 08/19/2019. FINDINGS: Gallbladder: There is sludge within the gallbladder. No definite shadowing stone, gallbladder wall thickening,  or pericholecystic fluid. Negative sonographic Murphy's sign. Common bile duct: Diameter: 16 mm. Interval increase in the size of the common bile duct as well as development of mild intrahepatic biliary ductal dilatation since the prior ultrasound. Correlation with clinical exam, and LFTs recommended. MRCP may provide better evaluation. Liver: There is heterogeneously increased liver echogenicity most commonly seen in the setting of fatty infiltration. Superimposed inflammation or fibrosis is not excluded. Clinical correlation is recommended. Portal vein is patent on color Doppler imaging with normal direction of blood flow towards the liver. Other: None. IMPRESSION: 1. Moderate amount of gallbladder sludge. No sonographic findings of acute cholecystitis. 2. Dilatation of the common bile duct, progressed since the prior ultrasound with interval development of mild intrahepatic biliary ductal dilatation. Clinical correlation is recommended. MRCP may provide better evaluation. 3. Probable fatty liver or changes of cirrhosis. Electronically Signed   By: Anner Crete M.D.   On: 09/20/2019 18:38     ERCP (09/22/2019) Impression: - The major papilla appeared to be bulging and  congested as a result of the submucosal protrusion  of the mass. - A single severe biliary stricture was found in  the lower third of the main bile duct. The  stricture was malignant appearing. - The upper third of the main bile duct, middle  third of the main bile duct and left and right  hepatic ducts and all intrahepatic branches were  severely dilated, secondary to the stricture. - A biliary sphincterotomy was performed. - Cells for  cytology obtained in the lower third of  the main duct stricture. - One uncovered metal biliary stent was placed into  the common bile duct. This was dilated to ensure  stent expansion.  Recommendation: - The patient will be observed post-procedure,  until all discharge criteria are met. - Return patient to hospital ward for ongoing care. - Check liver enzymes (AST, ALT, alkaline  phosphatase, bilirubin) in the morning as well as a  CA19-9. - Observe patient's clinical course. - Await cytology results. - Watch for pancreatitis, bleeding, perforation,  and cholangitis. - Would consider CT-Chest to complete staging for  patient. - Oncology referral as inpatient vs outpatient  based on patient status in the next 24-48 hours  should be obtained. - Hold Chemical VTE PPx for 48 hours to decrease  risk of post-sphincterotomy bleeding if possible -  SCDs, TEDs OK as well as UOB.  Upper EUS (09/22/2019) Impression: EGD Impression: - No gross lesions in esophagus proximally. Grade I  esophageal varices distally. Z-line irregular, 38  cm from the incisors. - Erosive gastropathy with no bleeding and no  stigmata of recent bleeding. No other gross lesions  in the stomach. Biopsied for  HP. - Likely malignant duodenal mass though submucosal  at the region of the ampulla causing distortion of  the ampullary orifice. Biopsied superficially. EUS Impression: - A mass was identified in the pancreatic head.  Cytology results are pending. However, the  endosonographic appearance is highly suspicious for  adenocarcinoma. This was staged T2 N1 Mx by EUS  criteria. However, based on MRI imaging/size on the  MRICP, this would be staged T3 N1 Mx. The staging  applies if malignancy is confirmed. Fine needle  biopsy performed. - The pancreatic duct had intraductal stones in the  pancreatic head and genu of the  pancreas. The  pancreatic duct had a dilated endosonographic  appearance in the neck/body which tapered to the  tail but then dilated again without discrete  mass/lesion/stricture in the tail. - Hyperechoic material consistent with sludge was  visualized endosonographically in the gallbladder. - There was dilation in the common bile duct and in  the common hepatic duct. - A few malignant-appearing lymph nodes were  visualized in the gastrohepatic ligament (level 18)  and porta hepatis region. Cytology results are  pending. However, the endosonographic appearance is  suspicious for metastatic  pancreatic  adenocarcinoma. Fine needle biopsy performed.  Recommendation: - Proceed to scheduled ERCP. - Follow up Cytology and Pathology.  UPPER EUS (2/6/20201) Impression: EGD Impression: - Grade 1 small varices noted in esophagus but no  other gross lesions. - Non-bleeding gastric ulcer with a flat pigmented  spot (Forrest Class IIc). Clips (MR conditional)  were placed for additional hemostasis. - No other gross lesions in the stomach. - Non-bleeding duodenal ulcer with a clean ulcer  base (Forrest Class III). - Duodenal deformity. - Metal biliary stent at ampulla. EUS Impression: - A mass was identified in the pancreatic head.  Fine needle biopsy performed for additional tissue. Moderate Sedation: Not Applicable - Patient had care per Anesthesia. Recommendation: - The patient will be observed post-procedure,  until all discharge criteria are met. - Return patient to hospital ward for ongoing care. - Observe patient's clinical course. - Monitor for signs/symptoms of pancreatitis,  bleeding, perforation, and infection. If issues  please call our number to get further assistance as  needed. - Trend Hgb/Hct - IV PPI BID x 24 hours and then transition to PO  PPI BID x 8-weeks.   Subjective: No  abdominal pain, nausea, vomiting.  Discharge Exam: Vitals:   09/25/19 2013 09/26/19 0408  BP: (!) 164/70 (!) 147/79  Pulse: 75 72  Resp: 16 16  Temp: 98.4 F (36.9 C) 97.9 F (36.6 C)  SpO2: 97% 98%   Vitals:   09/25/19 1137 09/25/19 1347 09/25/19 2013 09/26/19 0408  BP: (!) 146/65 133/75 (!) 164/70 (!) 147/79  Pulse: 75 82 75 72  Resp: 20 16 16 16  Temp: 97.9 F (36.6 C) 98.3 F (36.8 C) 98.4 F (36.9 C) 97.9 F (36.6 C)  TempSrc: Oral Oral Oral Oral  SpO2:  100% 97% 98%  Weight:      Height:        General: Pt is alert, awake, not in acute distress Cardiovascular: RRR, S1/S2 +, no rubs, no gallops Respiratory: CTA bilaterally, no wheezing, no rhonchi Abdominal: Soft, NT, slightly distended, bowel sounds + Extremities: no edema, no cyanosis    The results of significant diagnostics from this hospitalization (including imaging, microbiology, ancillary and laboratory) are listed below for reference.     Microbiology: Recent Results (from the past 240 hour(s))  SARS CORONAVIRUS 2 (TAT 6-24 HRS) Nasopharyngeal Nasopharyngeal Swab     Status: None   Collection Time: 09/20/19  9:35 PM   Specimen: Nasopharyngeal Swab  Result Value Ref Range Status   SARS Coronavirus 2 NEGATIVE NEGATIVE Final    Comment: (NOTE) SARS-CoV-2 target nucleic acids are NOT DETECTED. The SARS-CoV-2 RNA is generally detectable in upper and lower respiratory specimens during the acute phase of infection. Negative results do not preclude SARS-CoV-2 infection, do not rule out co-infections with other pathogens, and should not be used as the sole basis for treatment or other patient management decisions. Negative results must be combined with clinical observations,   patient history, and epidemiological information. The expected result is Negative. Fact Sheet for Patients: https://www.fda.gov/media/138098/download Fact Sheet for Healthcare  Providers: https://www.fda.gov/media/138095/download This test is not yet approved or cleared by the [place] FDA and  has been authorized for detection and/or diagnosis of SARS-CoV-2 by FDA under an Emergency Use Authorization (EUA). This EUA will remain  in effect (meaning this test can be used) for the duration of the COVID-19 declaration under Section 56 4(b)(1) of the Act, 21 U.S.C. section 360bbb-3(b)(1), unless the authorization is terminated or revoked sooner. Performed at [facility], [address] [place], [place] [address]      Labs: BNP (last 3 results) No results for input(s): BNP in the last 8760 hours. Basic Metabolic Panel: Recent Labs  Lab 09/20/19 1124 09/21/19 0509 09/22/19 0539 09/23/19 0523 09/24/19 0520  NA 129* 132* 133* 131* 133*  K 3.1* 2.9* 3.3* 4.0 3.4*  CL 92* 98 101 100 100  CO2 26 24 23 22 26  GLUCOSE 111* 125* 134* 170* 146*  BUN 11 8 6* 15 19  CREATININE 0.98 <0.30* 0.38* 0.57 0.56  CALCIUM 9.4 8.6* 8.6* 8.5* 8.4*  MG  --   --  1.8  --   --    Liver Function Tests: Recent Labs  Lab 09/20/19 1124 09/21/19 0509 09/22/19 0539 09/23/19 0523 09/24/19 0520  AST 248* 236* 234* 198* 138*  ALT 157* 138* 138* 137* 111*  ALKPHOS 601* 450* 438* 438* 324*  BILITOT 26.6*  23.7* 21.5* 21.2* 14.7* 8.7*  PROT 7.4 6.8 6.8 6.9 6.1*  ALBUMIN 3.4* 2.2* 2.3* 2.2* 2.1*   Recent Labs  Lab 09/23/19 0523 09/24/19 0520  LIPASE 310* 266*   No results for input(s): AMMONIA in the last 168 hours. CBC: Recent Labs  Lab 09/20/19 1124 09/21/19 0509 09/22/19 0539 09/23/19 0523  WBC 11.8* 11.0* 9.9 10.0  NEUTROABS 7.8*  --   --  8.5*  HGB 11.5* 10.8* 10.4* 10.5*  HCT 33.9* 31.9* 31.6* 32.2*  MCV 89.1 88.6 90.0 90.4  PLT 415.0 Repeated and verified X2.* 397 383 412*   Cardiac Enzymes: Recent Labs  Lab 09/21/19 0509  CKTOTAL 78  CKMB 1.6   BNP: Invalid input(s): POCBNP CBG: Recent Labs  Lab 09/25/19 1553 09/25/19 2010  09/26/19 0004 09/26/19 0404 09/26/19 0734  GLUCAP 195* 143* 137* 174* 149*   D-Dimer No results for input(s): DDIMER in the last 72 hours. Hgb A1c No results for input(s): HGBA1C in the last 72 hours. Lipid Profile No results for input(s): CHOL, HDL, LDLCALC, TRIG, CHOLHDL, LDLDIRECT in the last 72 hours. Thyroid function studies No results for input(s): TSH, T4TOTAL, T3FREE, THYROIDAB in the last 72 hours.  Invalid input(s): FREET3 Anemia work up No results for input(s): VITAMINB12, FOLATE, FERRITIN, TIBC, IRON, RETICCTPCT in the last 72 hours. Urinalysis    Component Value Date/Time   COLORURINE AMBER (A) 09/20/2019 1951   APPEARANCEUR CLEAR 09/20/2019 1951   LABSPEC 1.021 09/20/2019 1951   PHURINE 5.0 09/20/2019 1951   GLUCOSEU NEGATIVE 09/20/2019 1951   HGBUR NEGATIVE 09/20/2019 1951   BILIRUBINUR SMALL (A) 09/20/2019 1951   BILIRUBINUR NEGATIVE 10/22/2018 1556   KETONESUR NEGATIVE 09/20/2019 1951   PROTEINUR 30 (A) 09/20/2019 1951   UROBILINOGEN 0.2 10/22/2018 1556   NITRITE NEGATIVE 09/20/2019 1951   LEUKOCYTESUR NEGATIVE 09/20/2019 1951   Sepsis Labs Invalid input(s): PROCALCITONIN,  WBC,  LACTICIDVEN Microbiology Recent Results (from the past 240 hour(s))  SARS CORONAVIRUS 2 (TAT 6-24 HRS) Nasopharyngeal   Nasopharyngeal Swab     Status: None   Collection Time: 09/20/19  9:35 PM   Specimen: Nasopharyngeal Swab  Result Value Ref Range Status   SARS Coronavirus 2 NEGATIVE NEGATIVE Final    Comment: (NOTE) SARS-CoV-2 target nucleic acids are NOT DETECTED. The SARS-CoV-2 RNA is generally detectable in upper and lower respiratory specimens during the acute phase of infection. Negative results do not preclude SARS-CoV-2 infection, do not rule out co-infections with other pathogens, and should not be used as the sole basis for treatment or other patient management decisions. Negative results must be combined with clinical observations, patient history, and  epidemiological information. The expected result is Negative. Fact Sheet for Patients: https://www.fda.gov/media/138098/download Fact Sheet for Healthcare Providers: https://www.fda.gov/media/138095/download This test is not yet approved or cleared by the [place] FDA and  has been authorized for detection and/or diagnosis of SARS-CoV-2 by FDA under an Emergency Use Authorization (EUA). This EUA will remain  in effect (meaning this test can be used) for the duration of the COVID-19 declaration under Section 56 4(b)(1) of the Act, 21 U.S.C. section 360bbb-3(b)(1), unless the authorization is terminated or revoked sooner. Performed at [facility], [address]  [place], [place] [address]      Time coordinating discharge: 35 minutes  SIGNED:   Ralph Nettey, MD [facility] 09/26/2019, 11:46 AM  

## 2019-09-26 NOTE — Progress Notes (Addendum)
Gastroenterology Inpatient Follow-up Note   PATIENT IDENTIFICATION  Melanie Alvarez is a 64 y.o. female with a pmh significant for chronic alcohol abuse, hepatitis C status post treatment, diverticulosis, diabetes, hypertension, obesity, recently diagnosed pancreatic adenocarcinoma status post EUS/ERCP for biliary obstruction,  Chronic liver disease (manifested by portal hypertension with grade 1 esophageal varices recently noted). Hospital Day: 7  SUBJECTIVE  Patient feels well today. No significant abdominal pain. She feels that her urine is continuing to improve color. She remains jaundiced however. She remains in a positive attitude Neurologic she will try to keep everything that comes against her. She feels that she may benefit from a PPI, so she was discussed with regards to optimization of alcohol cessation.  And she is interested in hearing more from social work.  Able to stabilized as an outpatient.   OBJECTIVE  Scheduled Inpatient Medications:  . amLODipine  10 mg Oral QHS  . Chlorhexidine Gluconate Cloth  6 each Topical Daily  . feeding supplement  1 Container Oral TID BM  . indomethacin  100 mg Rectal Once  . insulin aspart  0-9 Units Subcutaneous Q4H  . irbesartan  37.5 mg Oral Daily  . metoprolol succinate  25 mg Oral Daily  . pantoprazole  40 mg Oral BID   Continuous Inpatient Infusions:  PRN Inpatient Medications: traMADol   Physical Examination  Temp:  [97.9 F (36.6 C)-98.4 F (36.9 C)] 97.9 F (36.6 C) (02/07 0408) Pulse Rate:  [72-82] 72 (02/07 0408) Resp:  [16-22] 16 (02/07 0408) BP: (133-164)/(65-79) 147/79 (02/07 0408) SpO2:  [97 %-100 %] 98 % (02/07 0408) Temp (24hrs), Avg:98.1 F (36.7 C), Min:97.9 F (36.6 C), Max:98.4 F (36.9 C)  Weight: 97.2 kg GEN: NAD, nontoxic PSYCH: Cooperative, without pressured speech EYE: Sclerae anicteric CV: Nontachycardic RESP: No wheezing present GI: NABS, protuberant abdomen, nontender, without rebound or  guarding  MSK/EXT: Trace bilateral lower extremity edema  SKIN: Jaundice NEURO:  Alert & Oriented x 3, no focal deficits, no evidence of asterixis   Review of Data   Laboratory Studies   Recent Labs  Lab 09/22/19 0539 09/23/19 0523 09/24/19 0520  NA 133*   < > 133*  K 3.3*   < > 3.4*  CL 101   < > 100  CO2 23   < > 26  BUN 6*   < > 19  CREATININE 0.38*   < > 0.56  GLUCOSE 134*   < > 146*  CALCIUM 8.6*   < > 8.4*  MG 1.8  --   --    < > = values in this interval not displayed.   Recent Labs  Lab 09/24/19 0520  AST 138*  ALT 111*  ALKPHOS 324*    Recent Labs  Lab 09/21/19 0509 09/21/19 0509 09/22/19 0539 09/22/19 0539 09/23/19 0523  WBC 11.0*   < > 9.9   < > 10.0  HGB 10.8*   < > 10.4*   < > 10.5*  HCT 31.9*   < > 31.6*   < > 32.2*  PLT 397  --  383  --  412*   < > = values in this interval not displayed.   Recent Labs  Lab 09/20/19 1124  INR 1.3*   MELD-Na score: 24 at 09/22/2019  5:39 AM MELD score: 21 at 09/22/2019  5:39 AM Calculated from: Serum Creatinine: 0.38 mg/dL (Rounded to 1 mg/dL) at 09/22/2019  5:39 AM Serum Sodium: 133 mmol/L at 09/22/2019  5:39 AM Total  Bilirubin: 21.2 mg/dL at 09/22/2019  5:39 AM INR(ratio): 1.3 ratio at 09/20/2019 11:24 AM Age: 62 years 6 months  Imaging Studies  No new relevant studies to review  GI Procedures and Studies  No new relevant studies to review   ASSESSMENT  Melanie Alvarez is a 64 y.o. female  with a pmh significant for chronic alcohol abuse, hepatitis C status post treatment, diverticulosis, diabetes, hypertension, obesity, recently diagnosed pancreatic adenocarcinoma status post EUS/ERCP for biliary obstruction,  Chronic liver disease (manifested by portal hypertension with grade 1 esophageal varices recently noted).  Patient is doing well clinically today.  No evidence of any bleeding.  She feels that her urine is continuing to clear but no liver tests done today.  No contraindication for potential discharge today  as there is no evidence of post EUS pancreatitis or complications that we can note.  Patient has esophageal varices that are small but this could be driven by her chronic alcohol use and is no she had stopped briefly Reviewed the way from alcohol she may have improvement in regards to the portal hypertension.  We may need to consider nonselective beta-blockade in the future and can discuss that further in the future.   PLAN/RECOMMENDATIONS  CMP/INR within the next week Social work consultation as inpatient versus outpatient for continued work on alcohol cessation Consider endoscopic evaluation in 1 year Follow-up in clinic for management of portal hypertension though much will be given by oncology at this point Continue to have good nutrition intake with protein and low-fat to optimize your status before any potential therapies moving forward GI will sign off but please let us know if there is anything that we can be of assistance with   Please page/call with questions or concerns.   Justice Britain, MD [facility] Office # [phone]    LOS: 6 days  Irving Copas  09/26/2019, 10:40 AM

## 2019-09-27 ENCOUNTER — Encounter: Payer: Self-pay | Admitting: *Deleted

## 2019-09-27 ENCOUNTER — Telehealth: Payer: Self-pay | Admitting: *Deleted

## 2019-09-27 ENCOUNTER — Telehealth: Payer: Self-pay

## 2019-09-27 DIAGNOSIS — B182 Chronic viral hepatitis C: Secondary | ICD-10-CM

## 2019-09-27 NOTE — Telephone Encounter (Signed)
Patient is calling and was requesting a call back regarding forms that were dropped off. CB is [phone]

## 2019-09-27 NOTE — Telephone Encounter (Signed)
New patient appointment made per Dr. Marin Olp instructions.  Patient notified of appointment and will see Melanie Alvarez Wednesday.

## 2019-09-27 NOTE — Telephone Encounter (Signed)
Form faxed, pt is aware

## 2019-09-27 NOTE — Telephone Encounter (Signed)
The pt has been scheduled for a follow up and lab order in [place].  The pt has been advised.

## 2019-09-27 NOTE — Telephone Encounter (Signed)
-----   Message from Irving Copas., MD sent at 09/26/2019 10:46 AM EST ----- Regarding: Follow-up Melanie Alvarez,Please schedule follow-up in clinic in 4 weeks.CMP/INR in 1 week.Thanks.GM

## 2019-09-28 ENCOUNTER — Other Ambulatory Visit: Payer: Self-pay | Admitting: Nurse Practitioner

## 2019-09-28 ENCOUNTER — Other Ambulatory Visit: Payer: Self-pay | Admitting: *Deleted

## 2019-09-28 DIAGNOSIS — C259 Malignant neoplasm of pancreas, unspecified: Secondary | ICD-10-CM

## 2019-09-28 LAB — CYTOLOGY - NON PAP

## 2019-09-28 MED ORDER — LANCET DEVICE MISC: 6 refills | Status: DC

## 2019-09-28 MED ORDER — GLUCOSE BLOOD VI STRP: ORAL_STRIP | 6 refills | Status: DC

## 2019-09-29 ENCOUNTER — Inpatient Hospital Stay: Payer: BC Managed Care – PPO

## 2019-09-29 ENCOUNTER — Other Ambulatory Visit: Payer: Self-pay | Admitting: *Deleted

## 2019-09-29 ENCOUNTER — Other Ambulatory Visit: Payer: Self-pay

## 2019-09-29 ENCOUNTER — Inpatient Hospital Stay: Payer: BC Managed Care – PPO | Attending: Hematology & Oncology

## 2019-09-29 ENCOUNTER — Encounter: Payer: Self-pay | Admitting: Hematology & Oncology

## 2019-09-29 ENCOUNTER — Inpatient Hospital Stay (HOSPITAL_BASED_OUTPATIENT_CLINIC_OR_DEPARTMENT_OTHER): Payer: BC Managed Care – PPO | Admitting: Hematology & Oncology

## 2019-09-29 ENCOUNTER — Telehealth: Payer: Self-pay | Admitting: *Deleted

## 2019-09-29 VITALS — BP 130/75 | HR 82 | Temp 97.1°F | Resp 17

## 2019-09-29 VITALS — Ht 63.5 in | Wt 205.0 lb

## 2019-09-29 DIAGNOSIS — C250 Malignant neoplasm of head of pancreas: Secondary | ICD-10-CM

## 2019-09-29 DIAGNOSIS — C259 Malignant neoplasm of pancreas, unspecified: Secondary | ICD-10-CM | POA: Insufficient documentation

## 2019-09-29 DIAGNOSIS — Z7189 Other specified counseling: Secondary | ICD-10-CM

## 2019-09-29 DIAGNOSIS — Z452 Encounter for adjustment and management of vascular access device: Secondary | ICD-10-CM | POA: Insufficient documentation

## 2019-09-29 DIAGNOSIS — Z5111 Encounter for antineoplastic chemotherapy: Secondary | ICD-10-CM | POA: Insufficient documentation

## 2019-09-29 DIAGNOSIS — Z95828 Presence of other vascular implants and grafts: Secondary | ICD-10-CM

## 2019-09-29 LAB — CMP (CANCER CENTER ONLY)
ALT: 99 U/L — ABNORMAL HIGH (ref 0–44)
AST: 115 U/L — ABNORMAL HIGH (ref 15–41)
Albumin: 3.5 g/dL (ref 3.5–5.0)
Alkaline Phosphatase: 202 U/L — ABNORMAL HIGH (ref 38–126)
Anion gap: 7 (ref 5–15)
BUN: 13 mg/dL (ref 8–23)
CO2: 30 mmol/L (ref 22–32)
Calcium: 9.2 mg/dL (ref 8.9–10.3)
Chloride: 95 mmol/L — ABNORMAL LOW (ref 98–111)
Creatinine: 0.55 mg/dL (ref 0.44–1.00)
GFR, Est AFR Am: >60 mL/min (ref >60)
GFR, Estimated: >60 mL/min (ref >60)
Glucose, Bld: 255 mg/dL — ABNORMAL HIGH (ref 70–99)
Potassium: 3.5 mmol/L (ref 3.5–5.1)
Sodium: 132 mmol/L — ABNORMAL LOW (ref 135–145)
Total Bilirubin: 6.9 mg/dL (ref 0.3–1.2)
Total Protein: 6.8 g/dL (ref 6.5–8.1)

## 2019-09-29 LAB — LACTATE DEHYDROGENASE: LDH: 235 U/L — ABNORMAL HIGH (ref 98–192)

## 2019-09-29 LAB — CBC WITH DIFFERENTIAL (CANCER CENTER ONLY)
Abs Immature Granulocytes: 0.07 10*3/uL (ref 0.00–0.07)
Basophils Absolute: 0.1 10*3/uL (ref 0.0–0.1)
Basophils Relative: 1 %
Eosinophils Absolute: 0.1 10*3/uL (ref 0.0–0.5)
Eosinophils Relative: 1 %
HCT: 26.1 % — ABNORMAL LOW (ref 36.0–46.0)
Hemoglobin: 8.4 g/dL — ABNORMAL LOW (ref 12.0–15.0)
Immature Granulocytes: 1 %
Lymphocytes Relative: 22 %
Lymphs Abs: 2.3 10*3/uL (ref 0.7–4.0)
MCH: 30.4 pg (ref 26.0–34.0)
MCHC: 32.2 g/dL (ref 30.0–36.0)
MCV: 94.6 fL (ref 80.0–100.0)
Monocytes Absolute: 0.5 10*3/uL (ref 0.1–1.0)
Monocytes Relative: 5 %
Neutro Abs: 7.5 10*3/uL (ref 1.7–7.7)
Neutrophils Relative %: 70 %
Platelet Count: 419 10*3/uL — ABNORMAL HIGH (ref 150–400)
RBC: 2.76 MIL/uL — ABNORMAL LOW (ref 3.87–5.11)
RDW: 16.4 % — ABNORMAL HIGH (ref 11.5–15.5)
WBC Count: 10.6 10*3/uL — ABNORMAL HIGH (ref 4.0–10.5)
nRBC: 0 % (ref 0.0–0.2)

## 2019-09-29 MED ORDER — SODIUM CHLORIDE 0.9% FLUSH
10.0000 mL | INTRAVENOUS | Status: DC | PRN
  Administered 2019-09-29: 10 mL via INTRAVENOUS
  Filled 2019-09-29: qty 10

## 2019-09-29 MED ORDER — GLIMEPIRIDE 2 MG PO TABS: 2.0000 mg | ORAL_TABLET | Freq: Every day | ORAL | 6 refills | Status: DC

## 2019-09-29 MED ORDER — PANCRELIPASE (LIP-PROT-AMYL) 36000-114000 UNITS PO CPEP: 72000.0000 [IU] | ORAL_CAPSULE | Freq: Three times a day (TID) | ORAL | 6 refills | Status: DC

## 2019-09-29 MED ORDER — HEPARIN SOD (PORK) LOCK FLUSH 100 UNIT/ML IV SOLN
500.0000 [IU] | Freq: Once | INTRAVENOUS | Status: AC
  Administered 2019-09-29: 500 [IU] via INTRAVENOUS
  Filled 2019-09-29: qty 5

## 2019-09-29 MED ORDER — LIDOCAINE-PRILOCAINE 2.5-2.5 % EX CREA: 1.0000 "application " | TOPICAL_CREAM | CUTANEOUS | 0 refills | Status: DC | PRN

## 2019-09-29 NOTE — Telephone Encounter (Signed)
Dr. Marin Olp notified of total bili-6.9.  No new orders received at this time.

## 2019-09-29 NOTE — Progress Notes (Signed)
Hematology and Oncology Follow Up Visit  Melanie Alvarez [id number] 1955/12/14 64 y.o. 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 Whoolery is in the office for first visit.  I saw her in consultation at [facility] on 09/24/2019.  At that time, she had presented with obstructive jaundice.  Her bilirubin was 21.5.  She underwent an MRI of the abdomen on admission.  She was admitted on 09/20/2019.  The MRI showed a 3.5 x 3.1 cm mass in the region of the ampulla filling the ampullary portion of the duodenum.  There was ductal obstruction of both pancreatic and biliary duct.  She had bulky periportal lymph nodes.  She also had enlarged gastrohepatic lymph nodes.  Also noted was a 1.4 cm portacaval lymph node.  All vascular structures were patent.  She underwent ERCP and a stent was placed.  She had cytology done.  This was done on 09/22/2019.  The pathology report ([id number]) showed adeno carcinoma.  She had a CT scan of the chest done.  This was done on 09/23/2019.  There was nothing in the chest to suggest metastatic disease.  Her CA 19-9 was less than 2.  She was not seen by surgery in the hospital.  Her bilirubin has improved.  When she was discharged, her bilirubin was 8.7.  Today, her bilirubin is 6.9.  She feels well.  She still has some scleral icterus.  She has no diarrhea.  She has had no abdominal pain.  She has had no nausea or vomiting.  She is eating okay.  There is no cough.  She has had no fever.  She has a remote history of hepatitis C.  This apparently has been treated.  Overall, her performance status right now is ECOG 1.  Medications:  Current Outpatient Medications:  .  allopurinol (ZYLOPRIM) 100 MG tablet, Take 1 tablet (100 mg total) by mouth 3 (three) times daily after meals., Disp: 90 tablet, Rfl: 5 .  amLODipine  (NORVASC) 10 MG tablet, Take 1 tablet (10 mg total) by mouth at bedtime., Disp: 90 tablet, Rfl: 3 .  cholecalciferol (VITAMIN D) 400 UNITS TABS tablet, Take 400 Units by mouth daily., Disp: , Rfl:  .  Lecithin 1200 MG CAPS, Take by mouth., Disp: , Rfl:  .  metFORMIN (GLUCOPHAGE) 500 MG tablet, Take 1 tablet (500 mg total) by mouth 2 (two) times daily with a meal., Disp: 180 tablet, Rfl: 1 .  metoprolol succinate (TOPROL-XL) 25 MG 24 hr tablet, Take 1 tablet (25 mg total) by mouth daily. Take with or immediately following a meal., Disp: 90 tablet, Rfl: 3 .  Milk Thistle 1000 MG CAPS, Take by mouth., Disp: , Rfl:  .  olmesartan (BENICAR) 5 MG tablet, Take 1 tablet (5 mg total) by mouth daily., Disp: 90 tablet, Rfl: 1 .  pantoprazole (PROTONIX) 40 MG tablet, Take 1 tablet (40 mg total) by mouth 2 (two) times daily., Disp: 108 tablet, Rfl: 0 .  TURMERIC CURCUMIN PO, Take by mouth 2 (two) times daily., Disp: , Rfl:  .  glimepiride (AMARYL) 2 MG tablet, Take 1 tablet (2 mg total) by mouth daily with breakfast., Disp: 30 tablet, Rfl: 6 .  glucose blood test strip, Test blood glucose once daily. One Touch Verio test strips (Patient not taking: Reported on 09/29/2019), Disp: 100  each, Rfl: 6 .  Lancet Device MISC, Check blood glucose once daily. One [place] [id number] Delicapl LNC (Patient not taking: Reported on 09/29/2019), Disp: 100 each, Rfl: 6 .  lidocaine-prilocaine (EMLA) cream, Apply 1 application topically as needed., Disp: 30 g, Rfl: 0 .  lipase/protease/amylase (CREON) 36000 UNITS CPEP capsule, Take 2 capsules (72,000 Units total) by mouth 3 (three) times daily before meals., Disp: 180 capsule, Rfl: 6 No current facility-administered medications for this visit.  Facility-Administered Medications Ordered in Other Visits:  .  sodium chloride flush (NS) 0.9 % injection 10 mL, 10 mL, Intravenous, PRN, [place], Holli Humbles, NP, 10 mL at 09/29/19 1115  Allergies:  Allergies  Allergen Reactions  . Lisinopril  Swelling    Facial and upper lip    Past Medical History, Surgical history, Social history, and Family History were reviewed and updated.  Review of Systems: Review of Systems  Constitutional: Negative.   HENT:  Negative.   Eyes: Positive for icterus.  Respiratory: Negative.   Cardiovascular: Negative.   Gastrointestinal: Negative.   Endocrine: Negative.   Genitourinary: Negative.    Musculoskeletal: Negative.   Skin: Negative.   Neurological: Negative.   Hematological: Negative.   Psychiatric/Behavioral: Negative.     Physical Exam:  height is 5' 3.5" (1.613 m) and weight is 205 lb (93 kg).   Wt Readings from Last 3 Encounters:  09/29/19 205 lb (93 kg)  09/25/19 214 lb 4.6 oz (97.2 kg)  09/20/19 209 lb 3.2 oz (94.9 kg)    Physical Exam Vitals reviewed.  HENT:     Head: Normocephalic and atraumatic.  Eyes:     General: Scleral icterus present.     Pupils: Pupils are equal, round, and reactive to light.  Cardiovascular:     Rate and Rhythm: Normal rate and regular rhythm.     Heart sounds: Normal heart sounds.  Pulmonary:     Effort: Pulmonary effort is normal.     Breath sounds: Normal breath sounds.  Abdominal:     General: Bowel sounds are normal.     Palpations: Abdomen is soft.  Musculoskeletal:        General: No tenderness or deformity. Normal range of motion.     Cervical back: Normal range of motion.  Lymphadenopathy:     Cervical: No cervical adenopathy.  Skin:    General: Skin is warm and dry.     Findings: No erythema or rash.  Neurological:     Mental Status: She is alert and oriented to person, place, and time.  Psychiatric:        Behavior: Behavior normal.        Thought Content: Thought content normal.        Judgment: Judgment normal.      Lab Results  Component Value Date   WBC 10.6 (H) 09/29/2019   HGB 8.4 (L) 09/29/2019   HCT 26.1 (L) 09/29/2019   MCV 94.6 09/29/2019   PLT 419 (H) 09/29/2019     Chemistry      Component  Value Date/Time   NA 132 (L) 09/29/2019 1124   K 3.5 09/29/2019 1124   CL 95 (L) 09/29/2019 1124   CO2 30 09/29/2019 1124   BUN 13 09/29/2019 1124   CREATININE 0.55 09/29/2019 1124   CREATININE 0.78 03/23/2018 1018      Component Value Date/Time   CALCIUM 9.2 09/29/2019 1124   ALKPHOS 202 (H) 09/29/2019 1124   AST 115 (H) 09/29/2019 1124   ALT 99 (  H) 09/29/2019 1124   ALT 40 (H) 11/25/2017 0947   BILITOT 6.9 (HH) 09/29/2019 1124      Impression and Plan: Ms. Hardwell is a very nice 64 year old African-American female.  She has adenocarcinoma of the pancreas.  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  I think the real question is whether or not this would be operable.  I would think by the localized lymph nodes, that this would not be deemed operable right now.  I think that we should try, if possible, to give her neoadjuvant chemotherapy.  One problem that we have is whether or not her bilirubin is going to come down to a level that we can treat her.  I would like to try her on FOLFIRINOX.  I think this would be considered standard of care for neoadjuvant therapy.  Again I think she can tolerate this.  She was quite healthy before she got sick.  Again, her bilirubin is too high for [place] to treat right now.  We will plan to get her back in 2 weeks.  If her bilirubin has dropped to less than 3, then we can start her on therapy.  I spent about 45 minutes with her today.  She has a very strong faith.  I gave her a prayer blanket.  She was very pleased with this.  I will actually call general surgery.  I will see if they can take a look at the scans and see if they think that she may be resectable if we can shrink the primary tumor in the ampullary area and also these localized lymph nodes.  I would like to hope that this is a situation that we can potentially resect if we get a good response.   Volanda Napoleon, MD 2/10/20214:58 PM

## 2019-09-29 NOTE — Progress Notes (Signed)
START ON PATHWAY REGIMEN - Pancreatic Adenocarcinoma     A cycle is every 14 days:     Oxaliplatin      Leucovorin      Irinotecan      Fluorouracil   **Always confirm dose/schedule in your pharmacy ordering system**  Patient Characteristics: No Distant Metastases, Borderline Resectable or High Risk, Potentially Resectable, Primary Neoadjuvant Therapy, PS = 0, 1 Current evidence of distant metastases<= No AJCC T Category: T3 AJCC N Category: N2 AJCC M Category: M0 AJCC 8 Stage Grouping: III ECOG Performance Status: 1 Intent of Therapy: Curative Intent, Discussed with Patient

## 2019-09-29 NOTE — Patient Instructions (Signed)
Implanted Port Home Guide An implanted port is a device that is placed under the skin. It is usually placed in the chest. The device can be used to give IV medicine, to take blood, or for dialysis. You may have an implanted port if:  You need IV medicine that would be irritating to the small veins in your hands or arms.  You need IV medicines, such as antibiotics, for a long period of time.  You need IV nutrition for a long period of time.  You need dialysis. Having a port means that your health care provider will not need to use the veins in your arms for these procedures. You may have fewer limitations when using a port than you would if you used other types of long-term IVs, and you will likely be able to return to normal activities after your incision heals. An implanted port has two main parts:  Reservoir. The reservoir is the part where a needle is inserted to give medicines or draw blood. The reservoir is round. After it is placed, it appears as a small, raised area under your skin.  Catheter. The catheter is a thin, flexible tube that connects the reservoir to a vein. Medicine that is inserted into the reservoir goes into the catheter and then into the vein. How is my port accessed? To access your port:  A numbing cream may be placed on the skin over the port site.  Your health care provider will put on a mask and sterile gloves.  The skin over your port will be cleaned carefully with a germ-killing soap and allowed to dry.  Your health care provider will gently pinch the port and insert a needle into it.  Your health care provider will check for a blood return to make sure the port is in the vein and is not clogged.  If your port needs to remain accessed to get medicine continuously (constant infusion), your health care provider will place a clear bandage (dressing) over the needle site. The dressing and needle will need to be changed every week, or as told by your health care  provider. What is flushing? Flushing helps keep the port from getting clogged. Follow instructions from your health care provider about how and when to flush the port. Ports are usually flushed with saline solution or a medicine called heparin. The need for flushing will depend on how the port is used:  If the port is only used from time to time to give medicines or draw blood, the port may need to be flushed: ? Before and after medicines have been given. ? Before and after blood has been drawn. ? As part of routine maintenance. Flushing may be recommended every 4-6 weeks.  If a constant infusion is running, the port may not need to be flushed.  Throw away any syringes in a disposal container that is meant for sharp items (sharps container). You can buy a sharps container from a pharmacy, or you can make one by using an empty hard plastic bottle with a cover. How long will my port stay implanted? The port can stay in for as long as your health care provider thinks it is needed. When it is time for the port to come out, a surgery will be done to remove it. The surgery will be similar to the procedure that was done to put the port in. Follow these instructions at home:   Flush your port as told by your health care provider.    If you need an infusion over several days, follow instructions from your health care provider about how to take care of your port site. Make sure you: ? Wash your hands with soap and water before you change your dressing. If soap and water are not available, use alcohol-based hand sanitizer. ? Change your dressing as told by your health care provider. ? Place any used dressings or infusion bags into a plastic bag. Throw that bag in the trash. ? Keep the dressing that covers the needle clean and dry. Do not get it wet. ? Do not use scissors or sharp objects near the tube. ? Keep the tube clamped, unless it is being used.  Check your port site every day for signs of  infection. Check for: ? Redness, swelling, or pain. ? Fluid or blood. ? Pus or a bad smell.  Protect the skin around the port site. ? Avoid wearing bra straps that rub or irritate the site. ? Protect the skin around your port from seat belts. Place a soft pad over your chest if needed.  Bathe or shower as told by your health care provider. The site may get wet as long as you are not actively receiving an infusion.  Return to your normal activities as told by your health care provider. Ask your health care provider what activities are safe for you.  Carry a medical alert card or wear a medical alert bracelet at all times. This will let health care providers know that you have an implanted port in case of an emergency. Get help right away if:  You have redness, swelling, or pain at the port site.  You have fluid or blood coming from your port site.  You have pus or a bad smell coming from the port site.  You have a fever. Summary  Implanted ports are usually placed in the chest for long-term IV access.  Follow instructions from your health care provider about flushing the port and changing bandages (dressings).  Take care of the area around your port by avoiding clothing that puts pressure on the area, and by watching for signs of infection.  Protect the skin around your port from seat belts. Place a soft pad over your chest if needed.  Get help right away if you have a fever or you have redness, swelling, pain, drainage, or a bad smell at the port site. This information is not intended to replace advice given to you by your health care provider. Make sure you discuss any questions you have with your health care provider. Document Revised: 11/27/2018 Document Reviewed: 09/07/2016 Elsevier Patient Education  [employer]. Lidocaine; Prilocaine cream What is this medicine? LIDOCAINE; PRILOCAINE (LYE doe kane; PRIL oh kane) is a topical anesthetic that causes loss of feeling  in the skin and surrounding tissues. It is used to numb the skin before procedures or injections. This medicine may be used for other purposes; ask your health care provider or pharmacist if you have questions. COMMON BRAND NAME(S): ANODYNE LPT, EMLA, IV Novice Pack, Lido-Prilo Caine, LiProZonePak, [employer], [employer] with [employer], Microvix LP, [place], [place]-II, Port-Prep, PrepIV, Prilovix, [employer], Prilovix [employer], Prilovix Plus, Prilovix Ultralite, Prilovix Ultralite Plus, Prilovixil, [place]-II, Relador, [place], [facility] What should I tell my health care provider before I take this medicine? They need to know if you have any of these conditions:  G6PD deficiency  heart disease  kidney disease  liver disease  skin conditions or  sensitivity  an unusual or allergic reaction to lidocaine, prilocaine, other medicines, foods, dyes, or preservatives  pregnant or trying to get pregnant  breast-feeding How should I use this medicine? This medicine is for external use only on the skin. Do not take by mouth. Follow the directions on the prescription label. Wash hands before and after use. Do not use more or leave in contact with the skin longer than directed. Do not apply to eyes or open wounds. It can cause irritation and blurred or temporary loss of vision. If this medicine comes in contact with your eyes, immediately rinse the eye with water. Do not touch or rub the eye. Contact your health care provider right away. Talk to your pediatrician regarding the use of this medicine in children. While this medicine may be prescribed for children for selected conditions, precautions do apply. Overdosage: If you think you have taken too much of this medicine contact a poison control center or emergency room at once. NOTE: This medicine is only for you. Do not share this medicine with others. What if I miss a dose? This medicine is usually only applied once  prior to each procedure. It must be in contact with the skin for a period of time for it to work. If you applied this medicine later than directed, tell your health care professional before starting the procedure. What may interact with this medicine? This medicine may interact with the following medications:  acetaminophen  certain antibiotics like dapsone, nitrofurantoin, aminosalicylic acid, sulfasalazine  certain medicines for seizures like phenobarbital, phenytoin, valproic acid  chloroquine  cyclophosphamide  flutamide  hydroxyurea  ifosfamide  metoclopramide  nitroglycerin  other local anesthetics like pramoxine, tetracaine  primaquine  quinine This list may not describe all possible interactions. Give your health care provider a list of all the medicines, herbs, non-prescription drugs, or dietary supplements you use. Also tell them if you smoke, drink alcohol, or use illegal drugs. Some items may interact with your medicine. What should I watch for while using this medicine? Be careful to avoid injury to the treated area while it is numb and you are not aware of pain. Avoid scratching, rubbing, or exposing the treated area to hot or cold temperatures until complete sensation has returned. The numb feeling will wear off a few hours after applying the cream. What side effects may I notice from receiving this medicine? Side effects that you should report to your doctor or health care professional as soon as possible:  blurred vision  chest pain  difficulty breathing  dizziness  drowsiness  fast or irregular heartbeat  skin rash or itching  swelling of your throat, lips, or face  trembling Side effects that usually do not require medical attention (report to your doctor or health care professional if they continue or are bothersome):  changes in ability to feel hot or cold  redness and swelling at the application site This list may not describe all  possible side effects. Call your doctor for medical advice about side effects. You may report side effects to FDA at 1-800-FDA-1088. Where should I keep my medicine? Keep out of reach of children. Store at room temperature between 15 and 30 degrees C (59 and 86 degrees F). Keep container tightly closed. Throw away any unused medicine after the expiration date. NOTE: This sheet is a summary. It may not cover all possible information. If you have questions about this medicine, talk to your doctor, pharmacist, or health care provider.  2020  Elsevier/Gold Standard (2017-07-28 10:11:16)

## 2019-09-30 ENCOUNTER — Telehealth: Payer: Self-pay | Admitting: Hematology & Oncology

## 2019-09-30 ENCOUNTER — Encounter: Payer: Self-pay | Admitting: *Deleted

## 2019-09-30 LAB — CANCER ANTIGEN 19-9: CA 19-9: <2 U/mL (ref 0–35)

## 2019-09-30 NOTE — Telephone Encounter (Signed)
Called and LMVM and mailed schedule to patient for [facility] Chemo per 2/10 los I asked that she call back to confirm appts

## 2019-09-30 NOTE — Progress Notes (Signed)
[facility] sent on pancreatic biopsy per order of Dr. Marin Olp.

## 2019-10-01 ENCOUNTER — Telehealth: Payer: Self-pay | Admitting: Nurse Practitioner

## 2019-10-01 ENCOUNTER — Inpatient Hospital Stay: Payer: BC Managed Care – PPO | Admitting: Nutrition

## 2019-10-01 NOTE — Telephone Encounter (Signed)
Error

## 2019-10-01 NOTE — Progress Notes (Signed)
64 year old female diagnosed with Pancreas cancer. She is a patient of Dr. Marin Olp.  PMH includes HTN, DM2, Obesity, Hepatitis C, ETOH and Tobacco.  Medications include Vitamin D, Amaryl, Creon, Glucophage, Milk Thistle, Protonix, and Turmeric.  Labs include Sodium 132 and glucose 255.  Height: 5 ft 3.5 in. Weight: 205 pounds on Feb 10. UBW: 240 pounds in march. BMI: 35.74.  Patient recently discharged from the hospital where she was diagnosed with severe malnutrition due to weight loss, muscle and fat loss. Patient is confused about what she can eat. States she is maintaining her weight. Denies food intolerances or nutrition impact symptoms.  Nutrition Diagnosis: Food and Nutrition Related Knowledge Deficit related to new diagnosis of Pancreas cancer and associated treatments as evidenced by no prior need for nutrition related information.  Intervention: Educated patient on healthy plant based diet with increased protein in small frequent meals and snacks. Stressed portion control. Recommend one high protein shake daily. Questions answered and teach back used. Contact information given.  Monitoring, Evaluation, Goals: Patient will tolerate increased protein and adequate calories to replete muscle mass and maintain or minimize wt loss.  Patient has my contact number for questions.

## 2019-10-04 ENCOUNTER — Inpatient Hospital Stay: Payer: BC Managed Care – PPO | Admitting: Nurse Practitioner

## 2019-10-05 ENCOUNTER — Ambulatory Visit: Payer: BC Managed Care – PPO | Admitting: Physician Assistant

## 2019-10-05 ENCOUNTER — Other Ambulatory Visit: Payer: Self-pay | Admitting: Family

## 2019-10-06 ENCOUNTER — Telehealth: Payer: Self-pay | Admitting: Gastroenterology

## 2019-10-06 NOTE — Telephone Encounter (Signed)
Pt called asking for the name of the dietician that Dr. Rush Landmark referred her to. She stated that she had an appt with her last week. He name was Pamala Hurry and pt lost her contact number. Pls call her.

## 2019-10-06 NOTE — Telephone Encounter (Signed)
The pt has been advised that Dr Rush Landmark has not referred to any dietician.  She states she believes it was Dr Marin Olp and will call that office

## 2019-10-07 ENCOUNTER — Ambulatory Visit: Payer: BC Managed Care – PPO | Admitting: Physician Assistant

## 2019-10-07 ENCOUNTER — Encounter: Payer: BC Managed Care – PPO | Admitting: Nutrition

## 2019-10-07 ENCOUNTER — Telehealth: Payer: Self-pay | Admitting: Nutrition

## 2019-10-07 NOTE — Telephone Encounter (Signed)
Patient called with questions about foods she can eat and an episode of a BS of 71. Questions answered. Encouraged small snacks with Protein and Complex CHO. Patient appreciative and knows to call with further questions.

## 2019-10-12 ENCOUNTER — Encounter: Payer: Self-pay | Admitting: *Deleted

## 2019-10-12 ENCOUNTER — Inpatient Hospital Stay: Payer: BC Managed Care – PPO

## 2019-10-12 ENCOUNTER — Other Ambulatory Visit: Payer: Self-pay | Admitting: *Deleted

## 2019-10-12 ENCOUNTER — Other Ambulatory Visit: Payer: Self-pay

## 2019-10-12 DIAGNOSIS — C251 Malignant neoplasm of body of pancreas: Secondary | ICD-10-CM

## 2019-10-12 MED ORDER — DEXAMETHASONE 4 MG PO TABS: 8.0000 mg | ORAL_TABLET | Freq: Every day | ORAL | 5 refills | Status: DC

## 2019-10-12 MED ORDER — LORAZEPAM 0.5 MG PO TABS: 0.5000 mg | ORAL_TABLET | Freq: Four times a day (QID) | ORAL | 0 refills | Status: DC | PRN

## 2019-10-12 MED ORDER — ONDANSETRON HCL 8 MG PO TABS: 8.0000 mg | ORAL_TABLET | Freq: Two times a day (BID) | ORAL | 1 refills | Status: DC | PRN

## 2019-10-12 MED ORDER — LOPERAMIDE HCL 2 MG PO TABS: ORAL_TABLET | ORAL | 1 refills | Status: DC

## 2019-10-12 MED ORDER — PROCHLORPERAZINE MALEATE 10 MG PO TABS: 10.0000 mg | ORAL_TABLET | Freq: Four times a day (QID) | ORAL | 1 refills | Status: DC | PRN

## 2019-10-12 NOTE — Progress Notes (Unsigned)
Patient in chemotherapy education class with  Daughter in law. Discussed side effects of 5FU, Oxaliplatin, Irinotecan          which include but are not limited to myelosuppression, decreased appetite, fatigue, fever, allergic or infusional reaction, mucositis, cardiac toxicity, cough, SOB, altered taste, nausea and vomiting, diarrhea, constipation, elevated LFTs myalgia and arthralgias, hair loss or thinning, rash, skin dryness, nail changes, peripheral neuropathy, discolored urine, delayed wound healing, mental changes (Chemo brain), increased risk of infections, weight loss.  Reviewed infusion room and office policy and procedure and phone numbers 24 hours x 7 days a week.  Reviewed ambulatory pump specifics and how to manage safe handling at home.  Reviewed when to call the office with any concerns or problems.  Scientist, clinical (histocompatibility and immunogenetics) given.  Discussed portacath insertion and EMLA cream administration.  Antiemetic protocol and chemotherapy schedule reviewed. Patient verbalized understanding of chemotherapy indications and possible side effects.  Teachback done

## 2019-10-13 ENCOUNTER — Inpatient Hospital Stay: Payer: BC Managed Care – PPO

## 2019-10-13 ENCOUNTER — Inpatient Hospital Stay (HOSPITAL_BASED_OUTPATIENT_CLINIC_OR_DEPARTMENT_OTHER): Payer: BC Managed Care – PPO | Admitting: Hematology & Oncology

## 2019-10-13 ENCOUNTER — Encounter: Payer: Self-pay | Admitting: Hematology & Oncology

## 2019-10-13 VITALS — BP 145/85 | HR 83 | Temp 97.5°F | Resp 20 | Wt 203.2 lb

## 2019-10-13 DIAGNOSIS — Z5111 Encounter for antineoplastic chemotherapy: Secondary | ICD-10-CM | POA: Diagnosis not present

## 2019-10-13 DIAGNOSIS — C250 Malignant neoplasm of head of pancreas: Secondary | ICD-10-CM | POA: Diagnosis not present

## 2019-10-13 DIAGNOSIS — C251 Malignant neoplasm of body of pancreas: Secondary | ICD-10-CM

## 2019-10-13 LAB — CMP (CANCER CENTER ONLY)
ALT: 30 U/L (ref 0–44)
AST: 31 U/L (ref 15–41)
Albumin: 3.3 g/dL — ABNORMAL LOW (ref 3.5–5.0)
Alkaline Phosphatase: 85 U/L (ref 38–126)
Anion gap: 7 (ref 5–15)
BUN: 17 mg/dL (ref 8–23)
CO2: 27 mmol/L (ref 22–32)
Calcium: 9.4 mg/dL (ref 8.9–10.3)
Chloride: 101 mmol/L (ref 98–111)
Creatinine: 0.64 mg/dL (ref 0.44–1.00)
GFR, Est AFR Am: >60 mL/min (ref >60)
GFR, Estimated: >60 mL/min (ref >60)
Glucose, Bld: 143 mg/dL — ABNORMAL HIGH (ref 70–99)
Potassium: 3.7 mmol/L (ref 3.5–5.1)
Sodium: 135 mmol/L (ref 135–145)
Total Bilirubin: 3.3 mg/dL — ABNORMAL HIGH (ref 0.3–1.2)
Total Protein: 7.4 g/dL (ref 6.5–8.1)

## 2019-10-13 LAB — CBC WITH DIFFERENTIAL (CANCER CENTER ONLY)
Abs Immature Granulocytes: 0.01 10*3/uL (ref 0.00–0.07)
Basophils Absolute: 0.1 10*3/uL (ref 0.0–0.1)
Basophils Relative: 1 %
Eosinophils Absolute: 0.1 10*3/uL (ref 0.0–0.5)
Eosinophils Relative: 1 %
HCT: 30.5 % — ABNORMAL LOW (ref 36.0–46.0)
Hemoglobin: 10 g/dL — ABNORMAL LOW (ref 12.0–15.0)
Immature Granulocytes: 0 %
Lymphocytes Relative: 28 %
Lymphs Abs: 2 10*3/uL (ref 0.7–4.0)
MCH: 30.4 pg (ref 26.0–34.0)
MCHC: 32.8 g/dL (ref 30.0–36.0)
MCV: 92.7 fL (ref 80.0–100.0)
Monocytes Absolute: 0.3 10*3/uL (ref 0.1–1.0)
Monocytes Relative: 5 %
Neutro Abs: 4.6 10*3/uL (ref 1.7–7.7)
Neutrophils Relative %: 65 %
Platelet Count: 437 10*3/uL — ABNORMAL HIGH (ref 150–400)
RBC: 3.29 MIL/uL — ABNORMAL LOW (ref 3.87–5.11)
RDW: 14.1 % (ref 11.5–15.5)
WBC Count: 7.1 10*3/uL (ref 4.0–10.5)
nRBC: 0 % (ref 0.0–0.2)

## 2019-10-13 MED ORDER — DEXTROSE 5 % IV SOLN
Freq: Once | INTRAVENOUS | Status: AC
  Filled 2019-10-13: qty 250

## 2019-10-13 MED ORDER — SODIUM CHLORIDE 0.9 % IV SOLN
400.0000 mg/m2 | Freq: Once | INTRAVENOUS | Status: AC
  Administered 2019-10-13: 816 mg via INTRAVENOUS
  Filled 2019-10-13: qty 40.8

## 2019-10-13 MED ORDER — DEXAMETHASONE SODIUM PHOSPHATE 10 MG/ML IJ SOLN
10.0000 mg | Freq: Once | INTRAMUSCULAR | Status: AC
  Administered 2019-10-13: 10 mg via INTRAVENOUS

## 2019-10-13 MED ORDER — PALONOSETRON HCL INJECTION 0.25 MG/5ML
0.2500 mg | Freq: Once | INTRAVENOUS | Status: AC
  Administered 2019-10-13: 0.25 mg via INTRAVENOUS

## 2019-10-13 MED ORDER — DEXAMETHASONE SODIUM PHOSPHATE 10 MG/ML IJ SOLN
INTRAMUSCULAR | Status: AC
  Filled 2019-10-13: qty 1

## 2019-10-13 MED ORDER — PALONOSETRON HCL INJECTION 0.25 MG/5ML
INTRAVENOUS | Status: AC
  Filled 2019-10-13: qty 5

## 2019-10-13 MED ORDER — SODIUM CHLORIDE 0.9 % IV SOLN
75.0000 mg/m2 | Freq: Once | INTRAVENOUS | Status: AC
  Administered 2019-10-13: 160 mg via INTRAVENOUS
  Filled 2019-10-13: qty 8

## 2019-10-13 MED ORDER — SODIUM CHLORIDE 0.9 % IV SOLN
1200.0000 mg/m2 | INTRAVENOUS | Status: DC
  Administered 2019-10-13: 2450 mg via INTRAVENOUS
  Filled 2019-10-13: qty 49

## 2019-10-13 MED ORDER — ATROPINE SULFATE 1 MG/ML IJ SOLN: 0.5000 mg | Freq: Once | INTRAMUSCULAR | Status: DC | PRN

## 2019-10-13 MED ORDER — ATROPINE SULFATE 1 MG/ML IJ SOLN
INTRAMUSCULAR | Status: AC
  Filled 2019-10-13: qty 1

## 2019-10-13 MED ORDER — SODIUM CHLORIDE 0.9 % IV SOLN
150.0000 mg | Freq: Once | INTRAVENOUS | Status: AC
  Administered 2019-10-13: 150 mg via INTRAVENOUS
  Filled 2019-10-13: qty 150

## 2019-10-13 MED ORDER — OXALIPLATIN CHEMO INJECTION 100 MG/20ML
85.0000 mg/m2 | Freq: Once | INTRAVENOUS | Status: AC
  Administered 2019-10-13: 175 mg via INTRAVENOUS
  Filled 2019-10-13: qty 35

## 2019-10-13 NOTE — Progress Notes (Signed)
Tbili 3.3 today. Doses of irinotecan and fluorouracil have been decreased. Dr. Marin Olp may increase future doses based on patient tolerance and  LFTs.

## 2019-10-13 NOTE — Patient Instructions (Signed)
[facility] Discharge Instructions for Patients Receiving Chemotherapy  Today you received the following chemotherapy agents Oxaliplatin, Leucovorin, Irinotecan, 5FU  To help prevent nausea and vomiting after your treatment, we encourage you to take your nausea medication as prescribed by MD. **DO NOT TAKE ZOFRAN FOR 3 DAYS AFTER CHEMOTHERAPY TREATMENT**   If you develop nausea and vomiting that is not controlled by your nausea medication, call the clinic.   BELOW ARE SYMPTOMS THAT SHOULD BE REPORTED IMMEDIATELY:  *FEVER GREATER THAN 100.5 F  *CHILLS WITH OR WITHOUT FEVER  NAUSEA AND VOMITING THAT IS NOT CONTROLLED WITH YOUR NAUSEA MEDICATION  *UNUSUAL SHORTNESS OF BREATH  *UNUSUAL BRUISING OR BLEEDING  TENDERNESS IN MOUTH AND THROAT WITH OR WITHOUT PRESENCE OF ULCERS  *URINARY PROBLEMS  *BOWEL PROBLEMS  UNUSUAL RASH Items with * indicate a potential emergency and should be followed up as soon as possible.  Feel free to call the clinic should you have any questions or concerns. The clinic phone number is [phone].  Please show the [facility] at check-in to the Emergency Department and triage nurse.   Oxaliplatin Injection What is this medicine? OXALIPLATIN (ox AL i PLA tin) is a chemotherapy drug. It targets fast dividing cells, like cancer cells, and causes these cells to die. This medicine is used to treat cancers of the colon and rectum, and many other cancers. This medicine may be used for other purposes; ask your health care provider or pharmacist if you have questions. COMMON BRAND NAME(S): Eloxatin What should I tell my health care provider before I take this medicine? They need to know if you have any of these conditions:  heart disease  history of irregular heartbeat  liver disease  low blood counts, like white cells, platelets, or red blood cells  lung or breathing disease, like asthma  take medicines that treat or prevent  blood clots  tingling of the fingers or toes, or other nerve disorder  an unusual or allergic reaction to oxaliplatin, other chemotherapy, other medicines, foods, dyes, or preservatives  pregnant or trying to get pregnant  breast-feeding How should I use this medicine? This drug is given as an infusion into a vein. It is administered in a hospital or clinic by a specially trained health care professional. Talk to your pediatrician regarding the use of this medicine in children. Special care may be needed. Overdosage: If you think you have taken too much of this medicine contact a poison control center or emergency room at once. NOTE: This medicine is only for you. Do not share this medicine with others. What if I miss a dose? It is important not to miss a dose. Call your doctor or health care professional if you are unable to keep an appointment. What may interact with this medicine? Do not take this medicine with any of the following medications:  cisapride  dronedarone  pimozide  thioridazine This medicine may also interact with the following medications:  aspirin and aspirin-like medicines  certain medicines that treat or prevent blood clots like warfarin, apixaban, dabigatran, and rivaroxaban  cisplatin  cyclosporine  diuretics  medicines for infection like acyclovir, adefovir, amphotericin B, bacitracin, cidofovir, foscarnet, ganciclovir, gentamicin, pentamidine, vancomycin  NSAIDs, medicines for pain and inflammation, like ibuprofen or naproxen  other medicines that prolong the QT interval (an abnormal heart rhythm)  pamidronate  zoledronic acid This list may not describe all possible interactions. Give your health care provider a list of all the medicines, herbs, non-prescription  drugs, or dietary supplements you use. Also tell them if you smoke, drink alcohol, or use illegal drugs. Some items may interact with your medicine. What should I watch for while using  this medicine? Your condition will be monitored carefully while you are receiving this medicine. You may need blood work done while you are taking this medicine. This medicine may make you feel generally unwell. This is not uncommon as chemotherapy can affect healthy cells as well as cancer cells. Report any side effects. Continue your course of treatment even though you feel ill unless your healthcare professional tells you to stop. This medicine can make you more sensitive to cold. Do not drink cold drinks or use ice. Cover exposed skin before coming in contact with cold temperatures or cold objects. When out in cold weather wear warm clothing and cover your mouth and nose to warm the air that goes into your lungs. Tell your doctor if you get sensitive to the cold. Do not become pregnant while taking this medicine or for 9 months after stopping it. Women should inform their health care professional if they wish to become pregnant or think they might be pregnant. Men should not father a child while taking this medicine and for 6 months after stopping it. There is potential for serious side effects to an unborn child. Talk to your health care professional for more information. Do not breast-feed a child while taking this medicine or for 3 months after stopping it. This medicine has caused ovarian failure in some women. This medicine may make it more difficult to get pregnant. Talk to your health care professional if you are concerned about your fertility. This medicine has caused decreased sperm counts in some men. This may make it more difficult to father a child. Talk to your health care professional if you are concerned about your fertility. This medicine may increase your risk of getting an infection. Call your health care professional for advice if you get a fever, chills, or sore throat, or other symptoms of a cold or flu. Do not treat yourself. Try to avoid being around people who are sick. Avoid  taking medicines that contain aspirin, acetaminophen, ibuprofen, naproxen, or ketoprofen unless instructed by your health care professional. These medicines may hide a fever. Be careful brushing or flossing your teeth or using a toothpick because you may get an infection or bleed more easily. If you have any dental work done, tell your dentist you are receiving this medicine. What side effects may I notice from receiving this medicine? Side effects that you should report to your doctor or health care professional as soon as possible:  allergic reactions like skin rash, itching or hives, swelling of the face, lips, or tongue  breathing problems  cough  low blood counts - this medicine may decrease the number of white blood cells, red blood cells, and platelets. You may be at increased risk for infections and bleeding  nausea, vomiting  pain, redness, or irritation at site where injected  pain, tingling, numbness in the hands or feet  signs and symptoms of bleeding such as bloody or black, tarry stools; red or dark brown urine; spitting up blood or brown material that looks like coffee grounds; red spots on the skin; unusual bruising or bleeding from the eyes, gums, or nose  signs and symptoms of a dangerous change in heartbeat or heart rhythm like chest pain; dizziness; fast, irregular heartbeat; palpitations; feeling faint or lightheaded; falls  signs and symptoms  of infection like fever; chills; cough; sore throat; pain or trouble passing urine  signs and symptoms of liver injury like dark yellow or brown urine; general ill feeling or flu-like symptoms; light-colored stools; loss of appetite; nausea; right upper belly pain; unusually weak or tired; yellowing of the eyes or skin  signs and symptoms of low red blood cells or anemia such as unusually weak or tired; feeling faint or lightheaded; falls  signs and symptoms of muscle injury like dark urine; trouble passing urine or change in  the amount of urine; unusually weak or tired; muscle pain; back pain Side effects that usually do not require medical attention (report to your doctor or health care professional if they continue or are bothersome):  changes in taste  diarrhea  gas  hair loss  loss of appetite  mouth sores This list may not describe all possible side effects. Call your doctor for medical advice about side effects. You may report side effects to FDA at 1-800-FDA-1088. Where should I keep my medicine? This drug is given in a hospital or clinic and will not be stored at home. NOTE: This sheet is a summary. It may not cover all possible information. If you have questions about this medicine, talk to your doctor, pharmacist, or health care provider.  2020 Elsevier/Gold Standard (2018-12-23 12:20:35)   Leucovorin injection What is this medicine? LEUCOVORIN (loo koe VOR in) is used to prevent or treat the harmful effects of some medicines. This medicine is used to treat anemia caused by a low amount of folic acid in the body. It is also used with 5-fluorouracil (5-FU) to treat colon cancer. This medicine may be used for other purposes; ask your health care provider or pharmacist if you have questions. What should I tell my health care provider before I take this medicine? They need to know if you have any of these conditions:  anemia from low levels of vitamin B-12 in the blood  an unusual or allergic reaction to leucovorin, folic acid, other medicines, foods, dyes, or preservatives  pregnant or trying to get pregnant  breast-feeding How should I use this medicine? This medicine is for injection into a muscle or into a vein. It is given by a health care professional in a hospital or clinic setting. Talk to your pediatrician regarding the use of this medicine in children. Special care may be needed. Overdosage: If you think you have taken too much of this medicine contact a poison control center or  emergency room at once. NOTE: This medicine is only for you. Do not share this medicine with others. What if I miss a dose? This does not apply. What may interact with this medicine?  capecitabine  fluorouracil  phenobarbital  phenytoin  primidone  trimethoprim-sulfamethoxazole This list may not describe all possible interactions. Give your health care provider a list of all the medicines, herbs, non-prescription drugs, or dietary supplements you use. Also tell them if you smoke, drink alcohol, or use illegal drugs. Some items may interact with your medicine. What should I watch for while using this medicine? Your condition will be monitored carefully while you are receiving this medicine. This medicine may increase the side effects of 5-fluorouracil, 5-FU. Tell your doctor or health care professional if you have diarrhea or mouth sores that do not get better or that get worse. What side effects may I notice from receiving this medicine? Side effects that you should report to your doctor or health care professional as soon  as possible:  allergic reactions like skin rash, itching or hives, swelling of the face, lips, or tongue  breathing problems  fever, infection  mouth sores  unusual bleeding or bruising  unusually weak or tired Side effects that usually do not require medical attention (report to your doctor or health care professional if they continue or are bothersome):  constipation or diarrhea  loss of appetite  nausea, vomiting This list may not describe all possible side effects. Call your doctor for medical advice about side effects. You may report side effects to FDA at 1-800-FDA-1088. Where should I keep my medicine? This drug is given in a hospital or clinic and will not be stored at home. NOTE: This sheet is a summary. It may not cover all possible information. If you have questions about this medicine, talk to your doctor, pharmacist, or health care  provider.  2020 Elsevier/Gold Standard (2008-02-09 16:50:29)   Irinotecan injection What is this medicine? IRINOTECAN (ir in oh TEE kan ) is a chemotherapy drug. It is used to treat colon and rectal cancer. This medicine may be used for other purposes; ask your health care provider or pharmacist if you have questions. COMMON BRAND NAME(S): Camptosar What should I tell my health care provider before I take this medicine? They need to know if you have any of these conditions:  dehydration  diarrhea  infection (especially a virus infection such as chickenpox, cold sores, or herpes)  liver disease  low blood counts, like low white cell, platelet, or red cell counts  low levels of calcium, magnesium, or potassium in the blood  recent or ongoing radiation therapy  an unusual or allergic reaction to irinotecan, other medicines, foods, dyes, or preservatives  pregnant or trying to get pregnant  breast-feeding How should I use this medicine? This drug is given as an infusion into a vein. It is administered in a hospital or clinic by a specially trained health care professional. Talk to your pediatrician regarding the use of this medicine in children. Special care may be needed. Overdosage: If you think you have taken too much of this medicine contact a poison control center or emergency room at once. NOTE: This medicine is only for you. Do not share this medicine with others. What if I miss a dose? It is important not to miss your dose. Call your doctor or health care professional if you are unable to keep an appointment. What may interact with this medicine? This medicine may interact with the following medications:  antiviral medicines for HIV or AIDS  certain antibiotics like rifampin or rifabutin  certain medicines for fungal infections like itraconazole, ketoconazole, posaconazole, and voriconazole  certain medicines for seizures like carbamazepine, phenobarbital,  phenotoin  clarithromycin  gemfibrozil  nefazodone  St. John's Wort This list may not describe all possible interactions. Give your health care provider a list of all the medicines, herbs, non-prescription drugs, or dietary supplements you use. Also tell them if you smoke, drink alcohol, or use illegal drugs. Some items may interact with your medicine. What should I watch for while using this medicine? Your condition will be monitored carefully while you are receiving this medicine. You will need important blood work done while you are taking this medicine. This drug may make you feel generally unwell. This is not uncommon, as chemotherapy can affect healthy cells as well as cancer cells. Report any side effects. Continue your course of treatment even though you feel ill unless your doctor tells you to  stop. In some cases, you may be given additional medicines to help with side effects. Follow all directions for their use. You may get drowsy or dizzy. Do not drive, use machinery, or do anything that needs mental alertness until you know how this medicine affects you. Do not stand or sit up quickly, especially if you are an older patient. This reduces the risk of dizzy or fainting spells. Call your health care professional for advice if you get a fever, chills, or sore throat, or other symptoms of a cold or flu. Do not treat yourself. This medicine decreases your body's ability to fight infections. Try to avoid being around people who are sick. Avoid taking products that contain aspirin, acetaminophen, ibuprofen, naproxen, or ketoprofen unless instructed by your doctor. These medicines may hide a fever. This medicine may increase your risk to bruise or bleed. Call your doctor or health care professional if you notice any unusual bleeding. Be careful brushing and flossing your teeth or using a toothpick because you may get an infection or bleed more easily. If you have any dental work done, tell your  dentist you are receiving this medicine. Do not become pregnant while taking this medicine or for 6 months after stopping it. Women should inform their health care professional if they wish to become pregnant or think they might be pregnant. Men should not father a child while taking this medicine and for 3 months after stopping it. There is potential for serious side effects to an unborn child. Talk to your health care professional for more information. Do not breast-feed an infant while taking this medicine or for 7 days after stopping it. This medicine has caused ovarian failure in some women. This medicine may make it more difficult to get pregnant. Talk to your health care professional if you are concerned about your fertility. This medicine has caused decreased sperm counts in some men. This may make it more difficult to father a child. Talk to your health care professional if you are concerned about your fertility. What side effects may I notice from receiving this medicine? Side effects that you should report to your doctor or health care professional as soon as possible:  allergic reactions like skin rash, itching or hives, swelling of the face, lips, or tongue  chest pain  diarrhea  flushing, runny nose, sweating during infusion  low blood counts - this medicine may decrease the number of white blood cells, red blood cells and platelets. You may be at increased risk for infections and bleeding.  nausea, vomiting  pain, swelling, warmth in the leg  signs of decreased platelets or bleeding - bruising, pinpoint red spots on the skin, black, tarry stools, blood in the urine  signs of infection - fever or chills, cough, sore throat, pain or difficulty passing urine  signs of decreased red blood cells - unusually weak or tired, fainting spells, lightheadedness Side effects that usually do not require medical attention (report to your doctor or health care professional if they continue  or are bothersome):  constipation  hair loss  headache  loss of appetite  mouth sores  stomach pain This list may not describe all possible side effects. Call your doctor for medical advice about side effects. You may report side effects to FDA at 1-800-FDA-1088. Where should I keep my medicine? This drug is given in a hospital or clinic and will not be stored at home. NOTE: This sheet is a summary. It may not cover all possible  information. If you have questions about this medicine, talk to your doctor, pharmacist, or health care provider.  2020 Elsevier/Gold Standard (2018-09-25 10:09:17)   Fluorouracil, 5FU; Diclofenac topical cream What is this medicine? FLUOROURACIL; DICLOFENAC (flure oh YOOR a sil; dye KLOE fen ak) is a combination of a topical chemotherapy agent and non-steroidal anti-inflammatory drug (NSAID). It is used on the skin to treat skin cancer and skin conditions that could become cancer. This medicine may be used for other purposes; ask your health care provider or pharmacist if you have questions. COMMON BRAND NAME(S): FLUORAC What should I tell my health care provider before I take this medicine? They need to know if you have any of these conditions:  bleeding problems  cigarette smoker  DPD enzyme deficiency  heart disease  high blood pressure  if you frequently drink alcohol containing drinks  kidney disease  liver disease  open or infected skin  stomach problems  swelling or open sores at the treatment site  recent or planned coronary artery bypass graft (CABG) surgery  an unusual or allergic reaction to fluorouracil, diclofenac, aspirin, other NSAIDs, other medicines, foods, dyes, or preservatives  pregnant or trying to get pregnant  breast-feeding How should I use this medicine? This medicine is only for use on the skin. Follow the directions on the prescription label. Wash hands before and after use. Wash affected area and gently  pat dry. To apply this medicine use a cotton-tipped applicator, or use gloves if applying with fingertips. If applied with unprotected fingertips, it is very important to wash your hands well after you apply this medicine. Avoid applying to the eyes, nose, or mouth. Apply enough medicine to cover the affected area. You can cover the area with a light gauze dressing, but do not use tight or air-tight dressings. Finish the full course prescribed by your doctor or health care professional, even if you think your condition is better. Do not stop taking except on the advice of your doctor or health care professional. Talk to your pediatrician regarding the use of this medicine in children. Special care may be needed. Overdosage: If you think you have taken too much of this medicine contact a poison control center or emergency room at once. NOTE: This medicine is only for you. Do not share this medicine with others. What if I miss a dose? If you miss a dose, apply it as soon as you can. If it is almost time for your next dose, only use that dose. Do not apply extra doses. Contact your doctor or health care professional if you miss more than one dose. What may interact with this medicine? Interactions are not expected. Do not use any other skin products without telling your doctor or health care professional. This list may not describe all possible interactions. Give your health care provider a list of all the medicines, herbs, non-prescription drugs, or dietary supplements you use. Also tell them if you smoke, drink alcohol, or use illegal drugs. Some items may interact with your medicine. What should I watch for while using this medicine? Visit your doctor or healthcare provider for checks on your progress. You will need to use this medicine for 2 to 6 weeks. This may be longer depending on the condition being treated. You may not see full healing for another 1 to 2 months after you stop using the  medicine. This medicine may cause serious skin reactions. They can happen weeks to months after starting the medicine. Contact your  healthcare provider right away if you notice fevers or flu-like symptoms with a rash. The rash may be red or purple and then turn into blisters or peeling of the skin. Or, you might notice a red rash with swelling of the face, lips or lymph nodes in your neck or under your arms. Treated areas of skin can look unsightly during and for several weeks after treatment with this medicine. This medicine can make you more sensitive to the sun. Keep out of the sun. If you cannot avoid being in the sun, wear protective clothing and use sunscreen. Do not use sun lamps or tanning beds/booths. If a pet comes in contact with the area where this medicine was applied to your skin or if it is ingested, they may have a serious risk of side effects. If accidental contact happens, the skin of the pet should be washed right away with soap and water. Contact your vet right away if your pet becomes exposed. Do not become pregnant while taking this medicine. Women should inform their doctor if they wish to become pregnant or think they might be pregnant. There is a potential for serious side effects to an unborn child. Talk to your healthcare provider or pharmacist for more information. What side effects may I notice from receiving this medicine? Side effects that you should report to your doctor or health care professional as soon as possible:  allergic reactions like skin rash, itching or hives, swelling of the face, lips, or tongue  black or bloody stools, blood in the urine or vomit  blurred vision  chest pain  difficulty breathing or wheezing  rash, fever, and swollen lymph nodes  redness, blistering, peeling or loosening of the skin, including inside the mouth  severe redness and swelling of normal skin  slurred speech or weakness on one side of the body  trouble passing urine  or change in the amount of urine  unexplained weight gain or swelling  unusually weak or tired  yellowing of eyes or skin Side effects that usually do not require medical attention (report to your doctor or health care professional if they continue or are bothersome):  increased sensitivity of the skin to sun and ultraviolet light  pain and burning of the affected area  scaling or swelling of the affected area  skin rash, itching of the affected area  tenderness This list may not describe all possible side effects. Call your doctor for medical advice about side effects. You may report side effects to FDA at 1-800-FDA-1088. Where should I keep my medicine? Keep out of the reach of children and pets. Store at room temperature between 20 and 25 degrees C (68 and 77 degrees F). Throw away any unused medicine after the expiration date. NOTE: This sheet is a summary. It may not cover all possible information. If you have questions about this medicine, talk to your doctor, pharmacist, or health care provider.  2020 Elsevier/Gold Standard (2018-10-21 13:31:57)

## 2019-10-13 NOTE — Patient Instructions (Signed)

## 2019-10-13 NOTE — Progress Notes (Signed)
Hematology and Oncology Follow Up Visit  Melanie Alvarez [id number] 11/29/1955 64 y.o. 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  We will now start her chemotherapy today.  Did speak with one of the surgical oncologist on the phone.  She looked at the scans that Ms. Tsuda had.  She felt that she might be able to do resection if we get a good clinical response.  Our goal now is to make sure that we try to get a good response so that we can potentially resect out any disease to get her into remission.  Then we would consider adjuvant therapy.  I will plan for 4 cycles of FOLFIRINOX and then we will rescan her.  She has had no problems with nausea or vomiting.  Hopefully, her jaundice is better.  She has had no change in bowel or bladder habits.  She is still working.  She works in [employer] at one of the [employer].  She has had no bleeding.  There is been no leg swelling.  Overall, her performance status is ECOG 0.    Medications:  Current Outpatient Medications:  .  amLODipine (NORVASC) 10 MG tablet, Take 1 tablet (10 mg total) by mouth at bedtime., Disp: 90 tablet, Rfl: 3 .  cholecalciferol (VITAMIN D) 400 UNITS TABS tablet, Take 400 Units by mouth daily., Disp: , Rfl:  .  dexamethasone (DECADRON) 4 MG tablet, Take 2 tablets (8 mg total) by mouth daily. Start the day after chemotherapy for 3 days. Take with food., Disp: 8 tablet, Rfl: 5 .  glimepiride (AMARYL) 2 MG tablet, Take 1 tablet (2 mg total) by mouth daily with breakfast., Disp: 30 tablet, Rfl: 6 .  Lecithin 1200 MG CAPS, Take by mouth., Disp: , Rfl:  .  lidocaine-prilocaine (EMLA) cream, Apply 1 application topically as needed., Disp: 30 g, Rfl: 0 .  lipase/protease/amylase (CREON) 36000 UNITS CPEP capsule, Take 2 capsules (72,000  Units total) by mouth 3 (three) times daily before meals., Disp: 180 capsule, Rfl: 6 .  loperamide (IMODIUM A-D) 2 MG tablet, Take 2 at onset of diarrhea, then 1 every 2hrs until 12hr without a BM. May take 2 tab every 4hrs at bedtime. If diarrhea recurs repeat., Disp: 100 tablet, Rfl: 1 .  LORazepam (ATIVAN) 0.5 MG tablet, Take 1 tablet (0.5 mg total) by mouth every 6 (six) hours as needed for anxiety., Disp: 30 tablet, Rfl: 0 .  metFORMIN (GLUCOPHAGE) 500 MG tablet, Take 1 tablet (500 mg total) by mouth 2 (two) times daily with a meal., Disp: 180 tablet, Rfl: 1 .  metoprolol succinate (TOPROL-XL) 25 MG 24 hr tablet, Take 1 tablet (25 mg total) by mouth daily. Take with or immediately following a meal., Disp: 90 tablet, Rfl: 3 .  Milk Thistle 1000 MG CAPS, Take by mouth., Disp: , Rfl:  .  olmesartan (BENICAR) 5 MG tablet, Take 1 tablet (5 mg total) by mouth daily., Disp: 90 tablet, Rfl: 1 .  ondansetron (ZOFRAN) 8 MG tablet, Take 1 tablet (8 mg total) by mouth 2 (two) times daily as needed. Start on day 3 after chemotherapy., Disp: 30 tablet, Rfl: 1 .  pantoprazole (PROTONIX) 40 MG tablet, Take 1 tablet (40 mg total) by mouth 2 (two) times daily., Disp: 108 tablet, Rfl: 0 .  prochlorperazine (COMPAZINE) 10 MG tablet, Take 1 tablet (10 mg total) by mouth every 6 (six) hours as needed (Nausea or vomiting)., Disp: 30 tablet, Rfl: 1 .  TURMERIC CURCUMIN PO, Take by mouth 2 (two) times daily., Disp: , Rfl:  .  allopurinol (ZYLOPRIM) 100 MG tablet, Take 1 tablet (100 mg total) by mouth 3 (three) times daily after meals., Disp: 90 tablet, Rfl: 5 .  glucose blood test strip, Test blood glucose once daily. One Touch Verio test strips (Patient not taking: Reported on 09/29/2019), Disp: 100 each, Rfl: 6 .  Lancet Device MISC, Check blood glucose once daily. One [place] [id number] Delicapl LNC (Patient not taking: Reported on 09/29/2019), Disp: 100 each, Rfl: 6  Allergies:  Allergies  Allergen Reactions  . Lisinopril  Swelling    Facial and upper lip    Past Medical History, Surgical history, Social history, and Family History were reviewed and updated.  Review of Systems: Review of Systems  Constitutional: Negative.   HENT:  Negative.   Eyes: Positive for icterus.  Respiratory: Negative.   Cardiovascular: Negative.   Gastrointestinal: Negative.   Endocrine: Negative.   Genitourinary: Negative.    Musculoskeletal: Negative.   Skin: Negative.   Neurological: Negative.   Hematological: Negative.   Psychiatric/Behavioral: Negative.     Physical Exam:  weight is 203 lb 3.2 oz (92.2 kg). Her temporal temperature is 97.5 F (36.4 C) (abnormal). Her blood pressure is 145/85 (abnormal) and her pulse is 83. Her respiration is 20 and oxygen saturation is 100%.   Wt Readings from Last 3 Encounters:  10/13/19 203 lb 3.2 oz (92.2 kg)  09/29/19 205 lb (93 kg)  09/25/19 214 lb 4.6 oz (97.2 kg)    Physical Exam Vitals reviewed.  HENT:     Head: Normocephalic and atraumatic.  Eyes:     General: Scleral icterus present.     Pupils: Pupils are equal, round, and reactive to light.  Cardiovascular:     Rate and Rhythm: Normal rate and regular rhythm.     Heart sounds: Normal heart sounds.  Pulmonary:     Effort: Pulmonary effort is normal.     Breath sounds: Normal breath sounds.  Abdominal:     General: Bowel sounds are normal.     Palpations: Abdomen is soft.  Musculoskeletal:        General: No tenderness or deformity. Normal range of motion.     Cervical back: Normal range of motion.  Lymphadenopathy:     Cervical: No cervical adenopathy.  Skin:    General: Skin is warm and dry.     Findings: No erythema or rash.  Neurological:     Mental Status: She is alert and oriented to person, place, and time.  Psychiatric:        Behavior: Behavior normal.        Thought Content: Thought content normal.        Judgment: Judgment normal.      Lab Results  Component Value Date   WBC 7.1  10/13/2019   HGB 10.0 (L) 10/13/2019   HCT 30.5 (L) 10/13/2019   MCV 92.7 10/13/2019   PLT 437 (H) 10/13/2019     Chemistry      Component Value Date/Time   NA 132 (L) 09/29/2019 1124   K 3.5 09/29/2019 1124   CL 95 (L) 09/29/2019 1124   CO2 30 09/29/2019 1124   BUN 13 09/29/2019 1124   CREATININE 0.55 09/29/2019 1124   CREATININE 0.78  03/23/2018 1018      Component Value Date/Time   CALCIUM 9.2 09/29/2019 1124   ALKPHOS 202 (H) 09/29/2019 1124   AST 115 (H) 09/29/2019 1124   ALT 99 (H) 09/29/2019 1124   ALT 40 (H) 11/25/2017 0947   BILITOT 6.9 (HH) 09/29/2019 1124      Impression and Plan: Ms. Sundt is a very nice 63 year old African-American female.  She has adenocarcinoma of the pancreas.  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  We will now treat her in the neoadjuvant setting.  Again, hopefully we can get a good response so that she will be able to have a resection for potential cure.  We will have to see what her bilirubin is.  I suspect that still might be a little on the high side.  If so, we will have to make an adjustment with her chemotherapy protocol.  We will tentatively plan to get her back to see [place] in another 2 weeks or so.  She is in great shape.  I am just happy that her quality of life is doing good right now.  Hopefully, we will be able to have a positive impact on this malignancy so that she will have a long life.     Volanda Napoleon, MD 2/24/20218:53 AM

## 2019-10-14 ENCOUNTER — Telehealth: Payer: Self-pay | Admitting: *Deleted

## 2019-10-14 LAB — CANCER ANTIGEN 19-9: CA 19-9: <2 U/mL (ref 0–35)

## 2019-10-14 NOTE — Telephone Encounter (Signed)
Message received from patient stating that she is not able to take her creon d/t the size of the capsules and would like to know if she can stop taking them.  Dr. Marin Olp notified.  Call placed back to patient and patient notified per order of Dr. Marin Olp to open up Creon capsules, sprinkle them on something soft and take them as prescribed.  Anti-emetics reviewed with patient.  Teach back done.  Pt appreciative of call back and states that she will open capsules and take them as ordered by Dr. Marin Olp.

## 2019-10-15 ENCOUNTER — Inpatient Hospital Stay: Payer: BC Managed Care – PPO

## 2019-10-15 ENCOUNTER — Telehealth: Payer: Self-pay | Admitting: *Deleted

## 2019-10-15 ENCOUNTER — Other Ambulatory Visit: Payer: Self-pay

## 2019-10-15 VITALS — BP 134/78 | HR 84 | Temp 97.3°F | Resp 16

## 2019-10-15 DIAGNOSIS — Z5111 Encounter for antineoplastic chemotherapy: Secondary | ICD-10-CM | POA: Diagnosis not present

## 2019-10-15 DIAGNOSIS — C251 Malignant neoplasm of body of pancreas: Secondary | ICD-10-CM

## 2019-10-15 MED ORDER — HEPARIN SOD (PORK) LOCK FLUSH 100 UNIT/ML IV SOLN
500.0000 [IU] | Freq: Once | INTRAVENOUS | Status: AC | PRN
  Administered 2019-10-15: 500 [IU]
  Filled 2019-10-15: qty 5

## 2019-10-15 MED ORDER — SODIUM CHLORIDE 0.9% FLUSH
10.0000 mL | INTRAVENOUS | Status: DC | PRN
  Administered 2019-10-15: 10 mL
  Filled 2019-10-15: qty 10

## 2019-10-15 NOTE — Telephone Encounter (Signed)
Call received from [facility] at [facility] to inform Dr. Marin Olp that there was not enough tissue sample to complete Parad[facility] studies.  Dr. Marin Olp notified and no new orders received at this time.

## 2019-10-15 NOTE — Progress Notes (Signed)
Patient arrived early for pump d/c as she noticed chemo bag near empty. Ambulatory pump indicates 39mL remaining. This volume appears to be accurate. At the rate of 3.61mL/hr, pt has nearly 4 hours left on infusion. Per Dr Marin Olp, ok to d/c pump at this time. Remaining 5FU discarded.   CHEMO F/U:   Pt denies nausea/vomiting/altered appetite/changes in bowel function/neuropathy. pt verbalizes understanding of how to take anti-emetics and how to reach on call MD. dph

## 2019-10-15 NOTE — Patient Instructions (Signed)
Fluorouracil, 5-FU injection What is this medicine? FLUOROURACIL, 5-FU (flure oh YOOR a sil) is a chemotherapy drug. It slows the growth of cancer cells. This medicine is used to treat many types of cancer like breast cancer, colon or rectal cancer, pancreatic cancer, and stomach cancer. This medicine may be used for other purposes; ask your health care provider or pharmacist if you have questions. COMMON BRAND NAME(S): Adrucil What should I tell my health care provider before I take this medicine? They need to know if you have any of these conditions:  blood disorders  dihydropyrimidine dehydrogenase (DPD) deficiency  infection (especially a virus infection such as chickenpox, cold sores, or herpes)  kidney disease  liver disease  malnourished, poor nutrition  recent or ongoing radiation therapy  an unusual or allergic reaction to fluorouracil, other chemotherapy, other medicines, foods, dyes, or preservatives  pregnant or trying to get pregnant  breast-feeding How should I use this medicine? This drug is given as an infusion or injection into a vein. It is administered in a hospital or clinic by a specially trained health care professional. Talk to your pediatrician regarding the use of this medicine in children. Special care may be needed. Overdosage: If you think you have taken too much of this medicine contact a poison control center or emergency room at once. NOTE: This medicine is only for you. Do not share this medicine with others. What if I miss a dose? It is important not to miss your dose. Call your doctor or health care professional if you are unable to keep an appointment. What may interact with this medicine?  allopurinol  cimetidine  dapsone  digoxin  hydroxyurea  leucovorin  levamisole  medicines for seizures like ethotoin, fosphenytoin, phenytoin  medicines to increase blood counts like filgrastim, pegfilgrastim, sargramostim  medicines that  treat or prevent blood clots like warfarin, enoxaparin, and dalteparin  methotrexate  metronidazole  pyrimethamine  some other chemotherapy drugs like busulfan, cisplatin, estramustine, vinblastine  trimethoprim  trimetrexate  vaccines Talk to your doctor or health care professional before taking any of these medicines:  acetaminophen  aspirin  ibuprofen  ketoprofen  naproxen This list may not describe all possible interactions. Give your health care provider a list of all the medicines, herbs, non-prescription drugs, or dietary supplements you use. Also tell them if you smoke, drink alcohol, or use illegal drugs. Some items may interact with your medicine. What should I watch for while using this medicine? Visit your doctor for checks on your progress. This drug may make you feel generally unwell. This is not uncommon, as chemotherapy can affect healthy cells as well as cancer cells. Report any side effects. Continue your course of treatment even though you feel ill unless your doctor tells you to stop. In some cases, you may be given additional medicines to help with side effects. Follow all directions for their use. Call your doctor or health care professional for advice if you get a fever, chills or sore throat, or other symptoms of a cold or flu. Do not treat yourself. This drug decreases your body's ability to fight infections. Try to avoid being around people who are sick. This medicine may increase your risk to bruise or bleed. Call your doctor or health care professional if you notice any unusual bleeding. Be careful brushing and flossing your teeth or using a toothpick because you may get an infection or bleed more easily. If you have any dental work done, tell your dentist you are   receiving this medicine. Avoid taking products that contain aspirin, acetaminophen, ibuprofen, naproxen, or ketoprofen unless instructed by your doctor. These medicines may hide a fever. Do not  become pregnant while taking this medicine. Women should inform their doctor if they wish to become pregnant or think they might be pregnant. There is a potential for serious side effects to an unborn child. Talk to your health care professional or pharmacist for more information. Do not breast-feed an infant while taking this medicine. Men should inform their doctor if they wish to father a child. This medicine may lower sperm counts. Do not treat diarrhea with over the counter products. Contact your doctor if you have diarrhea that lasts more than 2 days or if it is severe and watery. This medicine can make you more sensitive to the sun. Keep out of the sun. If you cannot avoid being in the sun, wear protective clothing and use sunscreen. Do not use sun lamps or tanning beds/booths. What side effects may I notice from receiving this medicine? Side effects that you should report to your doctor or health care professional as soon as possible:  allergic reactions like skin rash, itching or hives, swelling of the face, lips, or tongue  low blood counts - this medicine may decrease the number of white blood cells, red blood cells and platelets. You may be at increased risk for infections and bleeding.  signs of infection - fever or chills, cough, sore throat, pain or difficulty passing urine  signs of decreased platelets or bleeding - bruising, pinpoint red spots on the skin, black, tarry stools, blood in the urine  signs of decreased red blood cells - unusually weak or tired, fainting spells, lightheadedness  breathing problems  changes in vision  chest pain  mouth sores  nausea and vomiting  pain, swelling, redness at site where injected  pain, tingling, numbness in the hands or feet  redness, swelling, or sores on hands or feet  stomach pain  unusual bleeding Side effects that usually do not require medical attention (report to your doctor or health care professional if they  continue or are bothersome):  changes in finger or toe nails  diarrhea  dry or itchy skin  hair loss  headache  loss of appetite  sensitivity of eyes to the light  stomach upset  unusually teary eyes This list may not describe all possible side effects. Call your doctor for medical advice about side effects. You may report side effects to FDA at 1-800-FDA-1088. Where should I keep my medicine? This drug is given in a hospital or clinic and will not be stored at home. NOTE: This sheet is a summary. It may not cover all possible information. If you have questions about this medicine, talk to your doctor, pharmacist, or health care provider.  2020 Elsevier/Gold Standard (2007-12-09 13:53:16)  

## 2019-10-19 ENCOUNTER — Ambulatory Visit: Payer: BC Managed Care – PPO | Admitting: Gastroenterology

## 2019-10-27 ENCOUNTER — Ambulatory Visit: Payer: BC Managed Care – PPO | Admitting: Hematology & Oncology

## 2019-10-27 ENCOUNTER — Inpatient Hospital Stay: Payer: BC Managed Care – PPO | Attending: Hematology & Oncology

## 2019-10-27 ENCOUNTER — Encounter: Payer: Self-pay | Admitting: Family

## 2019-10-27 ENCOUNTER — Other Ambulatory Visit: Payer: Self-pay

## 2019-10-27 ENCOUNTER — Inpatient Hospital Stay: Payer: BC Managed Care – PPO

## 2019-10-27 ENCOUNTER — Inpatient Hospital Stay (HOSPITAL_BASED_OUTPATIENT_CLINIC_OR_DEPARTMENT_OTHER): Payer: BC Managed Care – PPO | Admitting: Family

## 2019-10-27 VITALS — BP 155/74 | HR 86 | Temp 97.1°F | Resp 17 | Ht 66.0 in | Wt 200.1 lb

## 2019-10-27 VITALS — BP 154/83 | HR 96 | Temp 97.1°F | Resp 17 | Wt 200.8 lb

## 2019-10-27 DIAGNOSIS — Z452 Encounter for adjustment and management of vascular access device: Secondary | ICD-10-CM | POA: Insufficient documentation

## 2019-10-27 DIAGNOSIS — C251 Malignant neoplasm of body of pancreas: Secondary | ICD-10-CM

## 2019-10-27 DIAGNOSIS — Z5111 Encounter for antineoplastic chemotherapy: Secondary | ICD-10-CM | POA: Diagnosis not present

## 2019-10-27 DIAGNOSIS — C241 Malignant neoplasm of ampulla of Vater: Secondary | ICD-10-CM | POA: Diagnosis present

## 2019-10-27 DIAGNOSIS — C250 Malignant neoplasm of head of pancreas: Secondary | ICD-10-CM

## 2019-10-27 LAB — CBC WITH DIFFERENTIAL (CANCER CENTER ONLY)
Abs Immature Granulocytes: 0.02 10*3/uL (ref 0.00–0.07)
Basophils Absolute: 0.1 10*3/uL (ref 0.0–0.1)
Basophils Relative: 1 %
Eosinophils Absolute: 0.3 10*3/uL (ref 0.0–0.5)
Eosinophils Relative: 4 %
HCT: 32.7 % — ABNORMAL LOW (ref 36.0–46.0)
Hemoglobin: 10.6 g/dL — ABNORMAL LOW (ref 12.0–15.0)
Immature Granulocytes: 0 %
Lymphocytes Relative: 23 %
Lymphs Abs: 1.7 10*3/uL (ref 0.7–4.0)
MCH: 29.4 pg (ref 26.0–34.0)
MCHC: 32.4 g/dL (ref 30.0–36.0)
MCV: 90.6 fL (ref 80.0–100.0)
Monocytes Absolute: 0.5 10*3/uL (ref 0.1–1.0)
Monocytes Relative: 6 %
Neutro Abs: 5 10*3/uL (ref 1.7–7.7)
Neutrophils Relative %: 66 %
Platelet Count: 278 10*3/uL (ref 150–400)
RBC: 3.61 MIL/uL — ABNORMAL LOW (ref 3.87–5.11)
RDW: 12.8 % (ref 11.5–15.5)
WBC Count: 7.5 10*3/uL (ref 4.0–10.5)
nRBC: 0 % (ref 0.0–0.2)

## 2019-10-27 LAB — CMP (CANCER CENTER ONLY)
ALT: 27 U/L (ref 0–44)
AST: 21 U/L (ref 15–41)
Albumin: 3.8 g/dL (ref 3.5–5.0)
Alkaline Phosphatase: 57 U/L (ref 38–126)
Anion gap: 8 (ref 5–15)
BUN: 17 mg/dL (ref 8–23)
CO2: 28 mmol/L (ref 22–32)
Calcium: 9.6 mg/dL (ref 8.9–10.3)
Chloride: 100 mmol/L (ref 98–111)
Creatinine: 0.56 mg/dL (ref 0.44–1.00)
GFR, Est AFR Am: >60 mL/min (ref >60)
GFR, Estimated: >60 mL/min (ref >60)
Glucose, Bld: 214 mg/dL — ABNORMAL HIGH (ref 70–99)
Potassium: 3.7 mmol/L (ref 3.5–5.1)
Sodium: 136 mmol/L (ref 135–145)
Total Bilirubin: 1.5 mg/dL — ABNORMAL HIGH (ref 0.3–1.2)
Total Protein: 6.7 g/dL (ref 6.5–8.1)

## 2019-10-27 MED ORDER — SODIUM CHLORIDE 0.9% FLUSH
10.0000 mL | INTRAVENOUS | Status: DC | PRN
  Filled 2019-10-27: qty 10

## 2019-10-27 MED ORDER — METHYLPREDNISOLONE SODIUM SUCC 125 MG IJ SOLR
125.0000 mg | Freq: Once | INTRAMUSCULAR | Status: AC | PRN
  Administered 2019-10-27: 125 mg via INTRAVENOUS

## 2019-10-27 MED ORDER — SODIUM CHLORIDE 0.9 % IV SOLN
75.0000 mg/m2 | Freq: Once | INTRAVENOUS | Status: AC
  Administered 2019-10-27: 13:00:00 160 mg via INTRAVENOUS
  Filled 2019-10-27: qty 8

## 2019-10-27 MED ORDER — DEXAMETHASONE SODIUM PHOSPHATE 10 MG/ML IJ SOLN
10.0000 mg | Freq: Once | INTRAMUSCULAR | Status: AC
  Administered 2019-10-27: 10 mg via INTRAVENOUS

## 2019-10-27 MED ORDER — OXALIPLATIN CHEMO INJECTION 100 MG/20ML
85.0000 mg/m2 | Freq: Once | INTRAVENOUS | Status: AC
  Administered 2019-10-27: 175 mg via INTRAVENOUS
  Filled 2019-10-27: qty 35

## 2019-10-27 MED ORDER — DEXTROSE 5 % IV SOLN
Freq: Once | INTRAVENOUS | Status: AC
  Filled 2019-10-27: qty 250

## 2019-10-27 MED ORDER — DIPHENHYDRAMINE HCL 50 MG/ML IJ SOLN
50.0000 mg | Freq: Once | INTRAMUSCULAR | Status: AC | PRN
  Administered 2019-10-27: 50 mg via INTRAVENOUS

## 2019-10-27 MED ORDER — ATROPINE SULFATE 1 MG/ML IJ SOLN
0.5000 mg | Freq: Once | INTRAMUSCULAR | Status: AC | PRN
  Administered 2019-10-27: 0.5 mg via INTRAVENOUS

## 2019-10-27 MED ORDER — SODIUM CHLORIDE 0.9 % IV SOLN
150.0000 mg | Freq: Once | INTRAVENOUS | Status: AC
  Administered 2019-10-27: 150 mg via INTRAVENOUS
  Filled 2019-10-27: qty 150

## 2019-10-27 MED ORDER — ATROPINE SULFATE 1 MG/ML IJ SOLN
INTRAMUSCULAR | Status: AC
  Filled 2019-10-27: qty 1

## 2019-10-27 MED ORDER — SODIUM CHLORIDE 0.9 % IV SOLN
1200.0000 mg/m2 | INTRAVENOUS | Status: DC
  Administered 2019-10-27: 2450 mg via INTRAVENOUS
  Filled 2019-10-27: qty 49

## 2019-10-27 MED ORDER — PALONOSETRON HCL INJECTION 0.25 MG/5ML
0.2500 mg | Freq: Once | INTRAVENOUS | Status: AC
  Administered 2019-10-27: 0.25 mg via INTRAVENOUS

## 2019-10-27 MED ORDER — FAMOTIDINE IN NACL 20-0.9 MG/50ML-% IV SOLN
20.0000 mg | Freq: Once | INTRAVENOUS | Status: AC | PRN
  Administered 2019-10-27: 13:00:00 20 mg via INTRAVENOUS

## 2019-10-27 MED ORDER — HEPARIN SOD (PORK) LOCK FLUSH 100 UNIT/ML IV SOLN
500.0000 [IU] | Freq: Once | INTRAVENOUS | Status: DC | PRN
  Filled 2019-10-27: qty 5

## 2019-10-27 MED ORDER — SODIUM CHLORIDE 0.9 % IV SOLN
400.0000 mg/m2 | Freq: Once | INTRAVENOUS | Status: AC
  Administered 2019-10-27: 816 mg via INTRAVENOUS
  Filled 2019-10-27: qty 40.8

## 2019-10-27 MED ORDER — DEXAMETHASONE SODIUM PHOSPHATE 10 MG/ML IJ SOLN
INTRAMUSCULAR | Status: AC
  Filled 2019-10-27: qty 1

## 2019-10-27 MED ORDER — PALONOSETRON HCL INJECTION 0.25 MG/5ML
INTRAVENOUS | Status: AC
  Filled 2019-10-27: qty 5

## 2019-10-27 NOTE — Patient Instructions (Signed)

## 2019-10-27 NOTE — Patient Instructions (Signed)
[facility] Discharge Instructions for Patients Receiving Chemotherapy  Today you received the following chemotherapy agents Oxaliplatin, Leucovorin, Irinotecan, 5FU  To help prevent nausea and vomiting after your treatment, we encourage you to take your nausea medication as prescribed by MD. **DO NOT TAKE ZOFRAN FOR 3 DAYS AFTER CHEMOTHERAPY TREATMENT**   If you develop nausea and vomiting that is not controlled by your nausea medication, call the clinic.   BELOW ARE SYMPTOMS THAT SHOULD BE REPORTED IMMEDIATELY:  *FEVER GREATER THAN 100.5 F  *CHILLS WITH OR WITHOUT FEVER  NAUSEA AND VOMITING THAT IS NOT CONTROLLED WITH YOUR NAUSEA MEDICATION  *UNUSUAL SHORTNESS OF BREATH  *UNUSUAL BRUISING OR BLEEDING  TENDERNESS IN MOUTH AND THROAT WITH OR WITHOUT PRESENCE OF ULCERS  *URINARY PROBLEMS  *BOWEL PROBLEMS  UNUSUAL RASH Items with * indicate a potential emergency and should be followed up as soon as possible.  Feel free to call the clinic should you have any questions or concerns. The clinic phone number is [phone].  Please show the [facility] at check-in to the Emergency Department and triage nurse.

## 2019-10-27 NOTE — Progress Notes (Signed)
1305: Pt c/o bilateral hand cramping and walks to bathroom without any difficulty noted. Pt stated she felt some cramping in right leg as she walked to the bathroom.  1310: Pt returns from bathroom and c/o not being able to take a deep breath. Pt noted to be tachypnic. Skin warm and dry. Chemotherapy stopped and normal saline started per protocol. Pt placed on oxygen via nasal cannula.  1314: Pt medicated per orders; refer to [facility]. Vitals as charted; refer to flowsheets. Geoffery Lyons, NP at chair side to evaluate pt. Pt continues to say its hard to take a deep breath. Skin warm and dry. O2 sats 100% with oxygen via nasal cannula.  1320: Pt states its easier to take a deep breath. Pt denies any pain..   1325: Dr. Marin Olp at chairside to evaluate pt. Pt denies any pain. Pt talking in full and complete sentences. Skin warm and dry. Respirations equal and unlabored. Pt agreeable to plan to restart chemotherapy.

## 2019-10-27 NOTE — Progress Notes (Signed)
Hematology and Oncology Follow Up Visit  Melanie Alvarez [id number] [birth year] 64 y.o. 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 She is doing well and has no complaints at this time.  She did well with her first cycle of treatment and has not experienced any side effects so far.  No fever, chills, n/v, cough, rash, dizziness, SOB, chest pain, palpitations, abdominal pain or changes in bowel or bladder habits.  She has occasional puffiness in her left ankle and has history of previous sprain. No tenderness, numbness or tingling in her extremities at this time.  No falls or syncopal episodes to report.  No episodes of bleeding. No bruising or petechiae.  She has maintained a good appetite and is staying well hydrated. Her weight is stable.   ECOG Performance Status: 1 - Symptomatic but completely ambulatory  Medications:  Allergies as of 10/27/2019      Reactions   Lisinopril Swelling   Facial and upper lip      Medication List       Accurate as of October 27, 2019  8:55 AM. If you have any questions, ask your nurse or doctor.        allopurinol 100 MG tablet Commonly known as: ZYLOPRIM Take 1 tablet (100 mg total) by mouth 3 (three) times daily after meals.   amLODipine 10 MG tablet Commonly known as: NORVASC Take 1 tablet (10 mg total) by mouth at bedtime.   cholecalciferol 10 MCG (400 UNIT) Tabs tablet Commonly known as: VITAMIN D3 Take 400 Units by mouth daily.   dexamethasone 4 MG tablet Commonly known as: DECADRON Take 2 tablets (8 mg total) by mouth daily. Start the day after chemotherapy for 3 days. Take with food.   glimepiride 2 MG tablet Commonly known as: Amaryl Take 1 tablet (2 mg total) by mouth daily with breakfast.   glucose blood test strip Test blood  glucose once daily. One Touch Verio test strips   Lancet Device Misc Check blood glucose once daily. One TCH [id number] Delicapl LNC   Lecithin 1200 MG Caps Take by mouth.   lidocaine-prilocaine cream Commonly known as: EMLA Apply 1 application topically as needed.   lipase/protease/amylase 36000 UNITS Cpep capsule Commonly known as: Creon Take 2 capsules (72,000 Units total) by mouth 3 (three) times daily before meals.   loperamide 2 MG tablet Commonly known as: Imodium A-D Take 2 at onset of diarrhea, then 1 every 2hrs until 12hr without a BM. May take 2 tab every 4hrs at bedtime. If diarrhea recurs repeat.   LORazepam 0.5 MG tablet Commonly known as: Ativan Take 1 tablet (0.5 mg total) by mouth every 6 (six) hours as needed for anxiety.   metFORMIN 500 MG tablet Commonly known as: GLUCOPHAGE Take 1 tablet (500 mg total) by mouth 2 (two) times daily with a meal.   metoprolol succinate 25 MG 24 hr tablet Commonly known as: TOPROL-XL Take 1 tablet (25 mg total) by mouth daily. Take with or immediately following a meal.   Milk Thistle 1000 MG Caps Take by mouth.   olmesartan 5 MG tablet Commonly known as: BENICAR Take 1 tablet (5 mg total) by mouth daily.   ondansetron 8 MG tablet Commonly known as: Zofran Take 1 tablet (8 mg total) by mouth 2 (two) times  daily as needed. Start on day 3 after chemotherapy.   pantoprazole 40 MG tablet Commonly known as: PROTONIX Take 1 tablet (40 mg total) by mouth 2 (two) times daily.   prochlorperazine 10 MG tablet Commonly known as: COMPAZINE Take 1 tablet (10 mg total) by mouth every 6 (six) hours as needed (Nausea or vomiting).   TURMERIC CURCUMIN PO Take by mouth 2 (two) times daily.       Allergies:  Allergies  Allergen Reactions  . Lisinopril Swelling    Facial and upper lip    Past Medical History, Surgical history, Social history, and Family History were reviewed and updated.  Review of Systems: All other 10 point  review of systems is negative.   Physical Exam:  vitals were not taken for this visit.   Wt Readings from Last 3 Encounters:  10/13/19 203 lb 3.2 oz (92.2 kg)  09/29/19 205 lb (93 kg)  09/25/19 214 lb 4.6 oz (97.2 kg)    Ocular: Sclerae unicteric, pupils equal, round and reactive to light Ear-nose-throat: Oropharynx clear, dentition fair Lymphatic: No cervical or supraclavicular adenopathy Lungs no rales or rhonchi, good excursion bilaterally Heart regular rate and rhythm, no murmur appreciated Abd soft, nontender, positive bowel sounds, no liver or spleen tip palpated on exam, no fluid wave  MSK no focal spinal tenderness, no joint edema Neuro: non-focal, well-oriented, appropriate affect Breasts: Deferred   Lab Results  Component Value Date   WBC 7.5 10/27/2019   HGB 10.6 (L) 10/27/2019   HCT 32.7 (L) 10/27/2019   MCV 90.6 10/27/2019   PLT 278 10/27/2019   No results found for: FERRITIN, IRON, TIBC, UIBC, IRONPCTSAT Lab Results  Component Value Date   RBC 3.61 (L) 10/27/2019   No results found for: KPAFRELGTCHN, LAMBDASER, KAPLAMBRATIO No results found for: IGGSERUM, IGA, IGMSERUM No results found for: Odetta Pink, SPEI   Chemistry      Component Value Date/Time   NA 135 10/13/2019 0834   K 3.7 10/13/2019 0834   CL 101 10/13/2019 0834   CO2 27 10/13/2019 0834   BUN 17 10/13/2019 0834   CREATININE 0.64 10/13/2019 0834   CREATININE 0.78 03/23/2018 1018      Component Value Date/Time   CALCIUM 9.4 10/13/2019 0834   ALKPHOS 85 10/13/2019 0834   AST 31 10/13/2019 0834   ALT 30 10/13/2019 0834   ALT 40 (H) 11/25/2017 0947   BILITOT 3.3 (H) 10/13/2019 0834       Impression and Plan: Ms. Melanie Alvarez is a very pleasant 64 yo African American female with adenocarcinoma of the pancreas, ampullary carcinoma.  She did well with her first cycle of FOLFIRINOX and we will proceed with cycle 2 today as planned.  CA 19-9  last month was < 2.  We will plan to repeat scans after cycle 4.  We will see her again in another 2 weeks.  She will contact our office with any questions or concerns. We can certainly see her sooner if needed.   Laverna Peace, NP 3/10/20218:55 AM

## 2019-10-29 ENCOUNTER — Other Ambulatory Visit: Payer: Self-pay

## 2019-10-29 ENCOUNTER — Inpatient Hospital Stay: Payer: BC Managed Care – PPO

## 2019-10-29 VITALS — BP 154/93 | HR 89 | Temp 97.4°F | Resp 17

## 2019-10-29 DIAGNOSIS — C251 Malignant neoplasm of body of pancreas: Secondary | ICD-10-CM

## 2019-10-29 DIAGNOSIS — Z5111 Encounter for antineoplastic chemotherapy: Secondary | ICD-10-CM | POA: Diagnosis not present

## 2019-10-29 MED ORDER — HEPARIN SOD (PORK) LOCK FLUSH 100 UNIT/ML IV SOLN
500.0000 [IU] | Freq: Once | INTRAVENOUS | Status: AC | PRN
  Administered 2019-10-29: 500 [IU]
  Filled 2019-10-29: qty 5

## 2019-10-29 MED ORDER — SODIUM CHLORIDE 0.9% FLUSH
10.0000 mL | INTRAVENOUS | Status: DC | PRN
  Administered 2019-10-29: 14:00:00 10 mL
  Filled 2019-10-29: qty 10

## 2019-10-29 NOTE — Patient Instructions (Signed)

## 2019-11-10 ENCOUNTER — Other Ambulatory Visit: Payer: Self-pay

## 2019-11-10 ENCOUNTER — Inpatient Hospital Stay (HOSPITAL_BASED_OUTPATIENT_CLINIC_OR_DEPARTMENT_OTHER): Payer: BC Managed Care – PPO | Admitting: Hematology & Oncology

## 2019-11-10 ENCOUNTER — Encounter: Payer: Self-pay | Admitting: Hematology & Oncology

## 2019-11-10 ENCOUNTER — Telehealth: Payer: Self-pay | Admitting: Hematology & Oncology

## 2019-11-10 ENCOUNTER — Inpatient Hospital Stay: Payer: BC Managed Care – PPO

## 2019-11-10 VITALS — BP 157/93 | HR 109 | Resp 16

## 2019-11-10 VITALS — BP 127/103 | HR 78 | Temp 97.1°F | Resp 17 | Wt 195.0 lb

## 2019-11-10 DIAGNOSIS — C251 Malignant neoplasm of body of pancreas: Secondary | ICD-10-CM | POA: Diagnosis not present

## 2019-11-10 DIAGNOSIS — Z5111 Encounter for antineoplastic chemotherapy: Secondary | ICD-10-CM | POA: Diagnosis not present

## 2019-11-10 LAB — CBC WITH DIFFERENTIAL (CANCER CENTER ONLY)
Abs Immature Granulocytes: 0.02 10*3/uL (ref 0.00–0.07)
Basophils Absolute: 0.1 10*3/uL (ref 0.0–0.1)
Basophils Relative: 1 %
Eosinophils Absolute: 0.4 10*3/uL (ref 0.0–0.5)
Eosinophils Relative: 5 %
HCT: 34.2 % — ABNORMAL LOW (ref 36.0–46.0)
Hemoglobin: 11 g/dL — ABNORMAL LOW (ref 12.0–15.0)
Immature Granulocytes: 0 %
Lymphocytes Relative: 25 %
Lymphs Abs: 2 10*3/uL (ref 0.7–4.0)
MCH: 28.6 pg (ref 26.0–34.0)
MCHC: 32.2 g/dL (ref 30.0–36.0)
MCV: 89.1 fL (ref 80.0–100.0)
Monocytes Absolute: 0.6 10*3/uL (ref 0.1–1.0)
Monocytes Relative: 8 %
Neutro Abs: 4.7 10*3/uL (ref 1.7–7.7)
Neutrophils Relative %: 61 %
Platelet Count: 276 10*3/uL (ref 150–400)
RBC: 3.84 MIL/uL — ABNORMAL LOW (ref 3.87–5.11)
RDW: 12.5 % (ref 11.5–15.5)
WBC Count: 7.7 10*3/uL (ref 4.0–10.5)
nRBC: 0 % (ref 0.0–0.2)

## 2019-11-10 LAB — CMP (CANCER CENTER ONLY)
ALT: 34 U/L (ref 0–44)
AST: 26 U/L (ref 15–41)
Albumin: 3.7 g/dL (ref 3.5–5.0)
Alkaline Phosphatase: 56 U/L (ref 38–126)
Anion gap: 7 (ref 5–15)
BUN: 11 mg/dL (ref 8–23)
CO2: 29 mmol/L (ref 22–32)
Calcium: 9.2 mg/dL (ref 8.9–10.3)
Chloride: 102 mmol/L (ref 98–111)
Creatinine: 0.55 mg/dL (ref 0.44–1.00)
GFR, Est AFR Am: >60 mL/min (ref >60)
GFR, Estimated: >60 mL/min (ref >60)
Glucose, Bld: 150 mg/dL — ABNORMAL HIGH (ref 70–99)
Potassium: 3.8 mmol/L (ref 3.5–5.1)
Sodium: 138 mmol/L (ref 135–145)
Total Bilirubin: 0.8 mg/dL (ref 0.3–1.2)
Total Protein: 6.6 g/dL (ref 6.5–8.1)

## 2019-11-10 MED ORDER — SODIUM CHLORIDE 0.9 % IV SOLN
400.0000 mg/m2 | Freq: Once | INTRAVENOUS | Status: AC
  Administered 2019-11-10: 816 mg via INTRAVENOUS
  Filled 2019-11-10: qty 40.8

## 2019-11-10 MED ORDER — DEXTROSE 5 % IV SOLN
Freq: Once | INTRAVENOUS | Status: AC
  Filled 2019-11-10: qty 250

## 2019-11-10 MED ORDER — SODIUM CHLORIDE 0.9 % IV SOLN
40.0000 mg | Freq: Once | INTRAVENOUS | Status: AC
  Administered 2019-11-10: 40 mg via INTRAVENOUS
  Filled 2019-11-10: qty 4

## 2019-11-10 MED ORDER — SODIUM CHLORIDE 0.9 % IV SOLN
150.0000 mg | Freq: Once | INTRAVENOUS | Status: AC
  Administered 2019-11-10: 150 mg via INTRAVENOUS
  Filled 2019-11-10: qty 150

## 2019-11-10 MED ORDER — ATROPINE SULFATE 1 MG/ML IJ SOLN
0.5000 mg | Freq: Once | INTRAMUSCULAR | Status: AC | PRN
  Administered 2019-11-10: 0.5 mg via INTRAVENOUS

## 2019-11-10 MED ORDER — METHYLPREDNISOLONE SODIUM SUCC 40 MG IJ SOLR
INTRAMUSCULAR | Status: AC
  Filled 2019-11-10: qty 2

## 2019-11-10 MED ORDER — ATROPINE SULFATE 1 MG/ML IJ SOLN
INTRAMUSCULAR | Status: AC
  Filled 2019-11-10: qty 1

## 2019-11-10 MED ORDER — PALONOSETRON HCL INJECTION 0.25 MG/5ML
INTRAVENOUS | Status: AC
  Filled 2019-11-10: qty 5

## 2019-11-10 MED ORDER — HEPARIN SOD (PORK) LOCK FLUSH 100 UNIT/ML IV SOLN
500.0000 [IU] | Freq: Once | INTRAVENOUS | Status: DC | PRN
  Filled 2019-11-10: qty 5

## 2019-11-10 MED ORDER — DEXAMETHASONE SODIUM PHOSPHATE 10 MG/ML IJ SOLN
INTRAMUSCULAR | Status: AC
  Filled 2019-11-10: qty 1

## 2019-11-10 MED ORDER — DEXAMETHASONE SODIUM PHOSPHATE 10 MG/ML IJ SOLN
10.0000 mg | Freq: Once | INTRAMUSCULAR | Status: AC
  Administered 2019-11-10: 09:00:00 10 mg via INTRAVENOUS

## 2019-11-10 MED ORDER — OXALIPLATIN CHEMO INJECTION 100 MG/20ML
85.0000 mg/m2 | Freq: Once | INTRAVENOUS | Status: AC
  Administered 2019-11-10: 10:00:00 175 mg via INTRAVENOUS
  Filled 2019-11-10: qty 35

## 2019-11-10 MED ORDER — SODIUM CHLORIDE 0.9 % IV SOLN
75.0000 mg/m2 | Freq: Once | INTRAVENOUS | Status: AC
  Administered 2019-11-10: 160 mg via INTRAVENOUS
  Filled 2019-11-10: qty 8

## 2019-11-10 MED ORDER — SODIUM CHLORIDE 0.9 % IV SOLN
1200.0000 mg/m2 | INTRAVENOUS | Status: DC
  Administered 2019-11-10: 2450 mg via INTRAVENOUS
  Filled 2019-11-10: qty 49

## 2019-11-10 MED ORDER — DIPHENHYDRAMINE HCL 50 MG/ML IJ SOLN
25.0000 mg | Freq: Once | INTRAMUSCULAR | Status: AC
  Administered 2019-11-10: 25 mg via INTRAVENOUS

## 2019-11-10 MED ORDER — METHYLPREDNISOLONE SODIUM SUCC 125 MG IJ SOLR
80.0000 mg | Freq: Once | INTRAMUSCULAR | Status: AC
  Administered 2019-11-10: 80 mg via INTRAVENOUS

## 2019-11-10 MED ORDER — PALONOSETRON HCL INJECTION 0.25 MG/5ML
0.2500 mg | Freq: Once | INTRAVENOUS | Status: AC
  Administered 2019-11-10: 0.25 mg via INTRAVENOUS

## 2019-11-10 MED ORDER — SODIUM CHLORIDE 0.9% FLUSH
10.0000 mL | INTRAVENOUS | Status: DC | PRN
  Filled 2019-11-10: qty 10

## 2019-11-10 NOTE — Progress Notes (Signed)
Pt c/o of cramping to right hand/ fingers after oxaliplatin completed.  Reviewed with MD, VO for Pepcid 40mg  IVP and Solumedrol 80mg  ivp. Prior to getting medication from pyxis, pt c/o  more cramping to right and left hand and feet/legs. Requested oxygen as this helped last time. Sarah, PA at chairside, VO benadryl 25mg  ivp given. VS 157/93 HR 101 PAO2 100% on 2L [place].  Observe pt x 62minutes and reevaluate.

## 2019-11-10 NOTE — Patient Instructions (Signed)
[facility] Discharge Instructions for Patients Receiving Chemotherapy  Today you received the following chemotherapy agents Oxaliplatin, Leucovorin, Irinotecan, 5FU  To help prevent nausea and vomiting after your treatment, we encourage you to take your nausea medication as prescribed by MD. **DO NOT TAKE ZOFRAN FOR 3 DAYS AFTER CHEMOTHERAPY TREATMENT**   If you develop nausea and vomiting that is not controlled by your nausea medication, call the clinic.   BELOW ARE SYMPTOMS THAT SHOULD BE REPORTED IMMEDIATELY:  *FEVER GREATER THAN 100.5 F  *CHILLS WITH OR WITHOUT FEVER  NAUSEA AND VOMITING THAT IS NOT CONTROLLED WITH YOUR NAUSEA MEDICATION  *UNUSUAL SHORTNESS OF BREATH  *UNUSUAL BRUISING OR BLEEDING  TENDERNESS IN MOUTH AND THROAT WITH OR WITHOUT PRESENCE OF ULCERS  *URINARY PROBLEMS  *BOWEL PROBLEMS  UNUSUAL RASH Items with * indicate a potential emergency and should be followed up as soon as possible.  Feel free to call the clinic should you have any questions or concerns. The clinic phone number is [phone].  Please show the [facility] at check-in to the Emergency Department and triage nurse.

## 2019-11-10 NOTE — Progress Notes (Signed)
Crellin to start Irinotecan per [facility] NP.  BP 148/74

## 2019-11-10 NOTE — Telephone Encounter (Signed)
Appointments scheduled calendar printed per 3/24 los 

## 2019-11-10 NOTE — Patient Instructions (Signed)
Implanted Port Insertion, Care After °This sheet gives you information about how to care for yourself after your procedure. Your health care provider may also give you more specific instructions. If you have problems or questions, contact your health care provider. °What can I expect after the procedure? °After the procedure, it is common to have: °· Discomfort at the port insertion site. °· Bruising on the skin over the port. This should improve over 3-4 days. °Follow these instructions at home: °Port care °· After your port is placed, you will get a manufacturer's information card. The card has information about your port. Keep this card with you at all times. °· Take care of the port as told by your health care provider. Ask your health care provider if you or a family member can get training for taking care of the port at home. A home health care nurse may also take care of the port. °· Make sure to remember what type of port you have. °Incision care ° °  ° °· Follow instructions from your health care provider about how to take care of your port insertion site. Make sure you: °? Wash your hands with soap and water before and after you change your bandage (dressing). If soap and water are not available, use hand sanitizer. °? Change your dressing as told by your health care provider. °? Leave stitches (sutures), skin glue, or adhesive strips in place. These skin closures may need to stay in place for 2 weeks or longer. If adhesive strip edges start to loosen and curl up, you may trim the loose edges. Do not remove adhesive strips completely unless your health care provider tells you to do that. °· Check your port insertion site every day for signs of infection. Check for: °? Redness, swelling, or pain. °? Fluid or blood. °? Warmth. °? Pus or a bad smell. °Activity °· Return to your normal activities as told by your health care provider. Ask your health care provider what activities are safe for you. °· Do not  lift anything that is heavier than 10 lb (4.5 kg), or the limit that you are told, until your health care provider says that it is safe. °General instructions °· Take over-the-counter and prescription medicines only as told by your health care provider. °· Do not take baths, swim, or use a hot tub until your health care provider approves. Ask your health care provider if you may take showers. You may only be allowed to take sponge baths. °· Do not drive for 24 hours if you were given a sedative during your procedure. °· Wear a medical alert bracelet in case of an emergency. This will tell any health care providers that you have a port. °· Keep all follow-up visits as told by your health care provider. This is important. °Contact a health care provider if: °· You cannot flush your port with saline as directed, or you cannot draw blood from the port. °· You have a fever or chills. °· You have redness, swelling, or pain around your port insertion site. °· You have fluid or blood coming from your port insertion site. °· Your port insertion site feels warm to the touch. °· You have pus or a bad smell coming from the port insertion site. °Get help right away if: °· You have chest pain or shortness of breath. °· You have bleeding from your port that you cannot control. °Summary °· Take care of the port as told by your health   care provider. Keep the manufacturer's information card with you at all times. °· Change your dressing as told by your health care provider. °· Contact a health care provider if you have a fever or chills or if you have redness, swelling, or pain around your port insertion site. °· Keep all follow-up visits as told by your health care provider. °This information is not intended to replace advice given to you by your health care provider. Make sure you discuss any questions you have with your health care provider. °Document Revised: 03/03/2018 Document Reviewed: 03/03/2018 °Elsevier Patient Education ©  2020 [employer] Inc. ° °

## 2019-11-10 NOTE — Progress Notes (Signed)
Hematology and Oncology Follow Up Visit  [name] [id number] 1955/10/13 64 y.o. 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 She is doing well and has no complaints at this time.  While she is given her second cycle of treatment, she had a little bit of a side effect.  None of this was from the irinotecan.  She got some atropine.  I think this helped her out.  Otherwise, she is doing quite well nicely.  She really has had no problems with nausea or vomiting.  She is lost a little bit of weight.  I told her that I did not think this was much of a problem.  She has had no problems with constipation or diarrhea.  She has had no cough or shortness of breath.  She has had no bleeding.  She has had no fever.  There is been no leg swelling.  Overall, her performance status is ECOG 1.  Medications:  Allergies as of 11/10/2019      Reactions   Lisinopril Swelling   Facial and upper lip      Medication List       Accurate as of November 10, 2019  8:56 AM. If you have any questions, ask your nurse or doctor.        STOP taking these medications   Milk Thistle 1000 MG Caps Stopped by: Volanda Napoleon, MD     TAKE these medications   allopurinol 100 MG tablet Commonly known as: ZYLOPRIM Take 1 tablet (100 mg total) by mouth 3 (three) times daily after meals.   amLODipine 10 MG tablet Commonly known as: NORVASC Take 1 tablet (10 mg total) by mouth at bedtime.   cholecalciferol 10 MCG (400 UNIT) Tabs tablet Commonly known as: VITAMIN D3 Take 400 Units by mouth daily.   dexamethasone 4 MG tablet Commonly known as: DECADRON Take 2 tablets (8 mg total) by mouth daily. Start the day after chemotherapy for 3 days. Take with food.   glimepiride 2 MG tablet Commonly known as:  Amaryl Take 1 tablet (2 mg total) by mouth daily with breakfast.   glucose blood test strip Test blood glucose once daily. One Touch Verio test strips   Lancet Device Misc Check blood glucose once daily. One TCH [id number] Delicapl LNC   Lecithin 1200 MG Caps Take by mouth.   lidocaine-prilocaine cream Commonly known as: EMLA Apply 1 application topically as needed.   lipase/protease/amylase 36000 UNITS Cpep capsule Commonly known as: Creon Take 2 capsules (72,000 Units total) by mouth 3 (three) times daily before meals.   loperamide 2 MG tablet Commonly known as: Imodium A-D Take 2 at onset of diarrhea, then 1 every 2hrs until 12hr without a BM. May take 2 tab every 4hrs at bedtime. If diarrhea recurs repeat.   LORazepam 0.5 MG tablet Commonly known as: Ativan Take 1 tablet (0.5 mg total) by mouth every 6 (six) hours as needed for anxiety.   metFORMIN 500 MG tablet Commonly known as: GLUCOPHAGE Take 1 tablet (500 mg total) by mouth 2 (two) times daily with a meal.   metoprolol succinate 25 MG 24 hr tablet Commonly known as: TOPROL-XL Take 1 tablet (25 mg total) by mouth daily. Take with or immediately following a meal.   olmesartan 5 MG  tablet Commonly known as: BENICAR Take 1 tablet (5 mg total) by mouth daily.   ondansetron 8 MG tablet Commonly known as: Zofran Take 1 tablet (8 mg total) by mouth 2 (two) times daily as needed. Start on day 3 after chemotherapy.   pantoprazole 40 MG tablet Commonly known as: PROTONIX Take 1 tablet (40 mg total) by mouth 2 (two) times daily.   prochlorperazine 10 MG tablet Commonly known as: COMPAZINE Take 1 tablet (10 mg total) by mouth every 6 (six) hours as needed (Nausea or vomiting).   TURMERIC CURCUMIN PO Take by mouth 2 (two) times daily.       Allergies:  Allergies  Allergen Reactions  . Lisinopril Swelling    Facial and upper lip    Past Medical History, Surgical history, Social history, and Family History were  reviewed and updated.  Review of Systems: Review of Systems  Constitutional: Negative.   HENT: Negative.   Eyes: Negative.   Respiratory: Negative.   Cardiovascular: Negative.   Gastrointestinal: Negative.   Genitourinary: Negative.   Musculoskeletal: Negative.   Skin: Negative.   Neurological: Negative.   Endo/Heme/Allergies: Negative.   Psychiatric/Behavioral: Negative.       Physical Exam:  weight is 195 lb (88.5 kg). Her temporal temperature is 97.1 F (36.2 C) (abnormal). Her blood pressure is 127/103 (abnormal) and her pulse is 78. Her respiration is 17 and oxygen saturation is 100%.   Wt Readings from Last 3 Encounters:  11/10/19 195 lb (88.5 kg)  10/27/19 200 lb 1.9 oz (90.8 kg)  10/27/19 200 lb 12 oz (91.1 kg)    Physical Exam Vitals reviewed.  HENT:     Head: Normocephalic and atraumatic.  Eyes:     Pupils: Pupils are equal, round, and reactive to light.  Cardiovascular:     Rate and Rhythm: Normal rate and regular rhythm.     Heart sounds: Normal heart sounds.  Pulmonary:     Effort: Pulmonary effort is normal.     Breath sounds: Normal breath sounds.  Abdominal:     General: Bowel sounds are normal.     Palpations: Abdomen is soft.  Musculoskeletal:        General: No tenderness or deformity. Normal range of motion.     Cervical back: Normal range of motion.  Lymphadenopathy:     Cervical: No cervical adenopathy.  Skin:    General: Skin is warm and dry.     Findings: No erythema or rash.  Neurological:     Mental Status: She is alert and oriented to person, place, and time.  Psychiatric:        Behavior: Behavior normal.        Thought Content: Thought content normal.        Judgment: Judgment normal.      Lab Results  Component Value Date   WBC 7.7 11/10/2019   HGB 11.0 (L) 11/10/2019   HCT 34.2 (L) 11/10/2019   MCV 89.1 11/10/2019   PLT 276 11/10/2019   No results found for: FERRITIN, IRON, TIBC, UIBC, IRONPCTSAT Lab Results   Component Value Date   RBC 3.84 (L) 11/10/2019   No results found for: KPAFRELGTCHN, LAMBDASER, KAPLAMBRATIO No results found for: IGGSERUM, IGA, IGMSERUM No results found for: Ronnald Ramp, A1GS, A2GS, Violet Baldy, MSPIKE, SPEI   Chemistry      Component Value Date/Time   NA 138 11/10/2019 0820   K 3.8 11/10/2019 0820   CL 102 11/10/2019 0820  CO2 29 11/10/2019 0820   BUN 11 11/10/2019 0820   CREATININE 0.55 11/10/2019 0820   CREATININE 0.78 03/23/2018 1018      Component Value Date/Time   CALCIUM 9.2 11/10/2019 0820   ALKPHOS 56 11/10/2019 0820   AST 26 11/10/2019 0820   ALT 34 11/10/2019 0820   ALT 40 (H) 11/25/2017 0947   BILITOT 0.8 11/10/2019 0820       Impression and Plan: Ms. Hakimian is a very pleasant 64 yo African American female with adenocarcinoma of the pancreas, ampullary carcinoma.  We will go ahead with her third cycle of chemotherapy.  Again, I will plan for a total of 4 cycles of neoadjuvant chemotherapy and then hopefully she will be able to go off to surgery if we do not find anything that looks like there is a progressive disease.  I am just impressed as to how well she is tolerated treatment.  Her blood counts certainly have held up nicely.   Volanda Napoleon, MD 3/24/20218:56 AM

## 2019-11-10 NOTE — Progress Notes (Signed)
Patient had reaction to irinotecan with cycle 1 and reaction to oxaliplatin with cycle 2.  Will add Solu-medrol 80 mg IV, Pepcid 40 mg IV, and Benadryl 25 mg IV to her premedications for future cycles per Dr. Marin Olp.  Dexamethasone will be deleted.

## 2019-11-11 ENCOUNTER — Telehealth: Payer: Self-pay | Admitting: *Deleted

## 2019-11-11 LAB — CANCER ANTIGEN 19-9: CA 19-9: <2 U/mL (ref 0–35)

## 2019-11-11 NOTE — Telephone Encounter (Signed)
Message received from patient wanting to know if she is ok to get her second covid vaccine on Saturday after receiving chemotherapy yesterday.  Dr. Marin Olp notified of patient's message and order received for ok for pt to get second covid vaccine on Saturday.  Pt notified of MD orders and has no further questions or concerns at this time.

## 2019-11-12 ENCOUNTER — Inpatient Hospital Stay: Payer: BC Managed Care – PPO

## 2019-11-12 ENCOUNTER — Other Ambulatory Visit: Payer: Self-pay

## 2019-11-12 VITALS — BP 144/96 | HR 89 | Temp 97.1°F | Resp 16

## 2019-11-12 DIAGNOSIS — Z5111 Encounter for antineoplastic chemotherapy: Secondary | ICD-10-CM | POA: Diagnosis not present

## 2019-11-12 DIAGNOSIS — C251 Malignant neoplasm of body of pancreas: Secondary | ICD-10-CM

## 2019-11-12 MED ORDER — SODIUM CHLORIDE 0.9% FLUSH
10.0000 mL | INTRAVENOUS | Status: DC | PRN
  Administered 2019-11-12: 10 mL
  Filled 2019-11-12: qty 10

## 2019-11-12 MED ORDER — HEPARIN SOD (PORK) LOCK FLUSH 100 UNIT/ML IV SOLN
500.0000 [IU] | Freq: Once | INTRAVENOUS | Status: AC | PRN
  Administered 2019-11-12: 500 [IU]
  Filled 2019-11-12: qty 5

## 2019-11-17 NOTE — Progress Notes (Signed)
Pharmacist Chemotherapy Monitoring - Follow Up Assessment    I verify that I have reviewed each item in the below checklist:  . Regimen for the patient is scheduled for the appropriate day and plan matches scheduled date. Marland Kitchen Appropriate non-routine labs are ordered dependent on drug ordered. . If applicable, additional medications reviewed and ordered per protocol based on lifetime cumulative doses and/or treatment regimen.   Plan for follow-up and/or issues identified: No . I-vent associated with next due treatment: No . MD and/or nursing notified: No  Melanie Alvarez, Melanie Alvarez 11/17/2019 9:16 AM

## 2019-11-24 ENCOUNTER — Inpatient Hospital Stay: Payer: BC Managed Care – PPO | Attending: Hematology & Oncology

## 2019-11-24 ENCOUNTER — Inpatient Hospital Stay (HOSPITAL_BASED_OUTPATIENT_CLINIC_OR_DEPARTMENT_OTHER): Payer: BC Managed Care – PPO | Admitting: Hematology & Oncology

## 2019-11-24 ENCOUNTER — Inpatient Hospital Stay: Payer: BC Managed Care – PPO

## 2019-11-24 ENCOUNTER — Other Ambulatory Visit: Payer: Self-pay

## 2019-11-24 ENCOUNTER — Encounter: Payer: Self-pay | Admitting: Hematology & Oncology

## 2019-11-24 VITALS — BP 156/88 | HR 77 | Temp 97.3°F | Resp 18 | Ht 63.5 in | Wt 203.8 lb

## 2019-11-24 DIAGNOSIS — Z5111 Encounter for antineoplastic chemotherapy: Secondary | ICD-10-CM | POA: Diagnosis present

## 2019-11-24 DIAGNOSIS — C241 Malignant neoplasm of ampulla of Vater: Secondary | ICD-10-CM | POA: Diagnosis present

## 2019-11-24 DIAGNOSIS — Z452 Encounter for adjustment and management of vascular access device: Secondary | ICD-10-CM | POA: Insufficient documentation

## 2019-11-24 DIAGNOSIS — C251 Malignant neoplasm of body of pancreas: Secondary | ICD-10-CM

## 2019-11-24 LAB — CBC WITH DIFFERENTIAL (CANCER CENTER ONLY)
Abs Immature Granulocytes: 0.03 10*3/uL (ref 0.00–0.07)
Basophils Absolute: 0.1 10*3/uL (ref 0.0–0.1)
Basophils Relative: 1 %
Eosinophils Absolute: 0.2 10*3/uL (ref 0.0–0.5)
Eosinophils Relative: 3 %
HCT: 35.8 % — ABNORMAL LOW (ref 36.0–46.0)
Hemoglobin: 11.6 g/dL — ABNORMAL LOW (ref 12.0–15.0)
Immature Granulocytes: 0 %
Lymphocytes Relative: 32 %
Lymphs Abs: 2.7 10*3/uL (ref 0.7–4.0)
MCH: 28.4 pg (ref 26.0–34.0)
MCHC: 32.4 g/dL (ref 30.0–36.0)
MCV: 87.7 fL (ref 80.0–100.0)
Monocytes Absolute: 0.7 10*3/uL (ref 0.1–1.0)
Monocytes Relative: 8 %
Neutro Abs: 4.7 10*3/uL (ref 1.7–7.7)
Neutrophils Relative %: 56 %
Platelet Count: 220 10*3/uL (ref 150–400)
RBC: 4.08 MIL/uL (ref 3.87–5.11)
RDW: 12.8 % (ref 11.5–15.5)
WBC Count: 8.4 10*3/uL (ref 4.0–10.5)
nRBC: 0 % (ref 0.0–0.2)

## 2019-11-24 LAB — CMP (CANCER CENTER ONLY)
ALT: 37 U/L (ref 0–44)
AST: 29 U/L (ref 15–41)
Albumin: 3.8 g/dL (ref 3.5–5.0)
Alkaline Phosphatase: 52 U/L (ref 38–126)
Anion gap: 7 (ref 5–15)
BUN: 11 mg/dL (ref 8–23)
CO2: 31 mmol/L (ref 22–32)
Calcium: 9.5 mg/dL (ref 8.9–10.3)
Chloride: 102 mmol/L (ref 98–111)
Creatinine: 0.56 mg/dL (ref 0.44–1.00)
GFR, Est AFR Am: >60 mL/min (ref >60)
GFR, Estimated: >60 mL/min (ref >60)
Glucose, Bld: 93 mg/dL (ref 70–99)
Potassium: 3.5 mmol/L (ref 3.5–5.1)
Sodium: 140 mmol/L (ref 135–145)
Total Bilirubin: 0.5 mg/dL (ref 0.3–1.2)
Total Protein: 6.7 g/dL (ref 6.5–8.1)

## 2019-11-24 LAB — LACTATE DEHYDROGENASE: LDH: 258 U/L — ABNORMAL HIGH (ref 98–192)

## 2019-11-24 MED ORDER — SODIUM CHLORIDE 0.9 % IV SOLN
40.0000 mg | Freq: Once | INTRAVENOUS | Status: AC
  Administered 2019-11-24: 40 mg via INTRAVENOUS
  Filled 2019-11-24: qty 4

## 2019-11-24 MED ORDER — DEXTROSE 5 % IV SOLN
Freq: Once | INTRAVENOUS | Status: AC
  Filled 2019-11-24: qty 250

## 2019-11-24 MED ORDER — METHYLPREDNISOLONE SODIUM SUCC 125 MG IJ SOLR
INTRAMUSCULAR | Status: AC
  Filled 2019-11-24: qty 2

## 2019-11-24 MED ORDER — PALONOSETRON HCL INJECTION 0.25 MG/5ML
0.2500 mg | Freq: Once | INTRAVENOUS | Status: AC
  Administered 2019-11-24: 0.25 mg via INTRAVENOUS

## 2019-11-24 MED ORDER — HEPARIN SOD (PORK) LOCK FLUSH 100 UNIT/ML IV SOLN
500.0000 [IU] | Freq: Once | INTRAVENOUS | Status: DC | PRN
  Filled 2019-11-24: qty 5

## 2019-11-24 MED ORDER — SODIUM CHLORIDE 0.9 % IV SOLN
1200.0000 mg/m2 | INTRAVENOUS | Status: DC
  Administered 2019-11-24: 2450 mg via INTRAVENOUS
  Filled 2019-11-24: qty 49

## 2019-11-24 MED ORDER — ATROPINE SULFATE 1 MG/ML IJ SOLN
INTRAMUSCULAR | Status: AC
  Filled 2019-11-24: qty 1

## 2019-11-24 MED ORDER — SODIUM CHLORIDE 0.9 % IV SOLN
INTRAVENOUS | Status: DC
  Filled 2019-11-24: qty 250

## 2019-11-24 MED ORDER — DIPHENHYDRAMINE HCL 50 MG/ML IJ SOLN
INTRAMUSCULAR | Status: AC
  Filled 2019-11-24: qty 1

## 2019-11-24 MED ORDER — SODIUM CHLORIDE 0.9 % IV SOLN
150.0000 mg | Freq: Once | INTRAVENOUS | Status: AC
  Administered 2019-11-24: 150 mg via INTRAVENOUS
  Filled 2019-11-24: qty 5

## 2019-11-24 MED ORDER — DIPHENHYDRAMINE HCL 50 MG/ML IJ SOLN
25.0000 mg | Freq: Once | INTRAMUSCULAR | Status: AC
  Administered 2019-11-24: 25 mg via INTRAVENOUS

## 2019-11-24 MED ORDER — ATROPINE SULFATE 1 MG/ML IJ SOLN
0.5000 mg | Freq: Once | INTRAMUSCULAR | Status: AC | PRN
  Administered 2019-11-24: 0.5 mg via INTRAVENOUS

## 2019-11-24 MED ORDER — SODIUM CHLORIDE 0.9% FLUSH
10.0000 mL | Freq: Once | INTRAVENOUS | Status: DC
  Filled 2019-11-24: qty 10

## 2019-11-24 MED ORDER — SODIUM CHLORIDE 0.9 % IV SOLN
75.0000 mg/m2 | Freq: Once | INTRAVENOUS | Status: AC
  Administered 2019-11-24: 160 mg via INTRAVENOUS
  Filled 2019-11-24: qty 8

## 2019-11-24 MED ORDER — PALONOSETRON HCL INJECTION 0.25 MG/5ML
INTRAVENOUS | Status: AC
  Filled 2019-11-24: qty 5

## 2019-11-24 MED ORDER — SODIUM CHLORIDE 0.9% FLUSH
10.0000 mL | INTRAVENOUS | Status: DC | PRN
  Filled 2019-11-24: qty 10

## 2019-11-24 MED ORDER — OXALIPLATIN CHEMO INJECTION 100 MG/20ML
85.0000 mg/m2 | Freq: Once | INTRAVENOUS | Status: AC
  Administered 2019-11-24: 175 mg via INTRAVENOUS
  Filled 2019-11-24: qty 35

## 2019-11-24 MED ORDER — METHYLPREDNISOLONE SODIUM SUCC 125 MG IJ SOLR
80.0000 mg | Freq: Once | INTRAMUSCULAR | Status: AC
  Administered 2019-11-24: 80 mg via INTRAVENOUS

## 2019-11-24 MED ORDER — SODIUM CHLORIDE 0.9 % IV SOLN
400.0000 mg/m2 | Freq: Once | INTRAVENOUS | Status: AC
  Administered 2019-11-24: 816 mg via INTRAVENOUS
  Filled 2019-11-24: qty 40.8

## 2019-11-24 NOTE — Patient Instructions (Signed)
Tunneled Central Venous Catheter Flushing Guide  It is important to flush your tunneled central venous catheter each time you use it, both before and after you use it. Flushing your catheter will help prevent it from clogging. What are the risks? Risks may include:  Infection.  Air getting into the catheter and bloodstream. Supplies needed:  A clean pair of gloves.  A disinfecting wipe. Use an alcohol wipe, chlorhexidine wipe, or iodine wipe as told by your health care provider.  A 10 mL syringe that has been prefilled with saline solution.  An empty 10 mL syringe, if a substance called heparin was injected into your catheter. How to flush your catheter When you flush your catheter, make sure you follow any specific instructions from your health care provider or the manufacturer. These are general guidelines. Flushing your catheter before use If there is heparin in your catheter: 1. Wash your hands with soap and water. 2. Put on gloves. 3. Scrub the injection cap for a minimum of 15 seconds with a disinfecting wipe. 4. Unclamp the catheter. 5. Attach the empty syringe to the injection cap. 6. Pull the syringe plunger back and withdraw 10 mL of blood. 7. Place the syringe into an appropriate waste container. 8. Scrub the injection cap for 15 seconds with a disinfecting wipe. 9. Attach the prefilled syringe to the injection cap. 10. Flush the catheter by pushing the plunger forward until all the liquid from the syringe is in the catheter. 11. Remove the syringe from the injection cap. 12. Clamp the catheter. If there is no heparin in your catheter: 1. Wash your hands with soap and water. 2. Put on gloves. 3. Scrub the injection cap for 15 seconds with a disinfecting wipe. 4. Unclamp the catheter. 5. Attach the prefilled syringe to the injection cap. 6. Flush the catheter by pushing the plunger forward until 5 mL of the liquid from the syringe is in the catheter. 7. Pull back on  the syringe until you see blood in the catheter. 8. If you have been asked to collect any blood, follow your health care provider's instructions. Otherwise, flush the catheter with the rest of the solution from the syringe. 9. Remove the syringe from the injection cap. 10. Clamp the catheter.  Flushing your catheter after use 1. Wash your hands with soap and water. 2. Put on gloves. 3. Scrub the injection cap for 15 seconds with a disinfecting wipe. 4. Unclamp the catheter. 5. Attach the prefilled syringe to the injection cap. 6. Flush the catheter by pushing the plunger forward until all of the liquid from the syringe is in the catheter. 7. Remove the syringe from the injection cap. 8. Clamp the catheter. Problems and solutions  If blood cannot be completely cleared from the injection cap, you may need to have the injection cap replaced.  If the catheter is difficult to flush, use the pulsing method. The pulsing method involves pushing only a few milliliters of solution into the catheter at a time and pausing between pushes.  If you do not see blood in the catheter when you pull back on the syringe, change your body position, such as by raising your arms above your head. Take a deep breath and cough. Then, pull back on the syringe. If you still do not see blood, flush the catheter with a small amount of solution. Then, change positions again and take a breath or cough. Pull back on the syringe again. If you still do not see   blood, finish flushing the catheter and contact your health care provider. Do not use your catheter until your health care provider says it is okay. General tips  Have someone help you flush your catheter, if possible.  Do not force fluid through your catheter.  Do not use a syringe that is larger or smaller than 10 mL. Using a smaller syringe can make the catheter burst.  Do not use your catheter without flushing it first if it has heparin in it. Contact a health  care provider if:  You cannot see any blood in the catheter when you flush it before using it.  Your catheter is difficult to flush. Get help right away if:  You cannot flush the catheter.  The catheter leaks when you flush it or when there is fluid in it.  There are cracks or breaks in the catheter. Summary  It is important to flush your tunneled central venous catheter each time you use it, both before and after you use it.  Scrub the injection cap for 15 seconds with a disinfecting wipe before and after you flush it.  When you flush your catheter, make sure you follow any specific instructions from your health care provider or the manufacturer.  Get help right away if you cannot flush the catheter. This information is not intended to replace advice given to you by your health care provider. Make sure you discuss any questions you have with your health care provider. Document Revised: 04/30/2019 Document Reviewed: 10/21/2018 Elsevier Patient Education  2020 [employer]  

## 2019-11-24 NOTE — Progress Notes (Signed)
Hematology and Oncology Follow Up Visit  Melanie Alvarez [id number] 1955/11/05 64 y.o. 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 Melanie Alvarez is here today for follow-up and treatment.  Overall, she is doing quite nicely.  She has tolerated chemotherapy quite well.  She really had a very nice Easter weekend with her family.  She has had no problems with nausea or vomiting.  There has been no abdominal pain.  She has had no change in bowel or bladder habits.  She has had no cough.  She has had no bleeding or bruising.  She has had no leg swelling.  She has had no headache.  Her CA 19-9 is always been normal.  Overall, her performance status is ECOG 0.    Medications:  Allergies as of 11/24/2019      Reactions   Lisinopril Swelling   Facial and upper lip      Medication List       Accurate as of November 24, 2019  8:48 AM. If you have any questions, ask your nurse or doctor.        allopurinol 100 MG tablet Commonly known as: ZYLOPRIM Take 1 tablet (100 mg total) by mouth 3 (three) times daily after meals.   amLODipine 10 MG tablet Commonly known as: NORVASC Take 1 tablet (10 mg total) by mouth at bedtime.   cholecalciferol 10 MCG (400 UNIT) Tabs tablet Commonly known as: VITAMIN D3 Take 400 Units by mouth daily.   dexamethasone 4 MG tablet Commonly known as: DECADRON Take 2 tablets (8 mg total) by mouth daily. Start the day after chemotherapy for 3 days. Take with food.   glimepiride 2 MG tablet Commonly known as: Amaryl Take 1 tablet (2 mg total) by mouth daily with breakfast.   glucose blood test strip Test blood glucose once daily. One Touch Verio test strips   Lancet Device Misc Check blood glucose once daily. One TCH 99991111 Delicapl LNC   Lecithin 1200 MG Caps Take by mouth.   lidocaine-prilocaine cream Commonly  known as: EMLA Apply 1 application topically as needed.   lipase/protease/amylase 36000 UNITS Cpep capsule Commonly known as: Creon Take 2 capsules (72,000 Units total) by mouth 3 (three) times daily before meals. What changed:   how much to take  when to take this  reasons to take this   loperamide 2 MG tablet Commonly known as: Imodium A-D Take 2 at onset of diarrhea, then 1 every 2hrs until 12hr without a BM. May take 2 tab every 4hrs at bedtime. If diarrhea recurs repeat.   LORazepam 0.5 MG tablet Commonly known as: Ativan Take 1 tablet (0.5 mg total) by mouth every 6 (six) hours as needed for anxiety.   metFORMIN 500 MG tablet Commonly known as: GLUCOPHAGE Take 1 tablet (500 mg total) by mouth 2 (two) times daily with a meal.   metoprolol succinate 25 MG 24 hr tablet Commonly known as: TOPROL-XL Take 1 tablet (25 mg total) by mouth daily. Take with or immediately following a meal.   olmesartan 5 MG tablet Commonly known as: BENICAR Take 1 tablet (5 mg total) by mouth daily.   ondansetron 8 MG tablet Commonly known as: Zofran Take 1 tablet (8 mg total) by mouth 2 (two) times daily as needed. Start on day 3 after chemotherapy.   pantoprazole 40 MG  tablet Commonly known as: PROTONIX Take 1 tablet (40 mg total) by mouth 2 (two) times daily.   prochlorperazine 10 MG tablet Commonly known as: COMPAZINE Take 1 tablet (10 mg total) by mouth every 6 (six) hours as needed (Nausea or vomiting).   TURMERIC CURCUMIN PO Take by mouth 2 (two) times daily.       Allergies:  Allergies  Allergen Reactions  . Lisinopril Swelling    Facial and upper lip    Past Medical History, Surgical history, Social history, and Family History were reviewed and updated.  Review of Systems: Review of Systems  Constitutional: Negative.   HENT: Negative.   Eyes: Negative.   Respiratory: Negative.   Cardiovascular: Negative.   Gastrointestinal: Negative.   Genitourinary: Negative.    Musculoskeletal: Negative.   Skin: Negative.   Neurological: Negative.   Endo/Heme/Allergies: Negative.   Psychiatric/Behavioral: Negative.       Physical Exam:  height is 5' 3.5" (1.613 m) and weight is 203 lb 12.8 oz (92.4 kg). Her temporal temperature is 97.3 F (36.3 C) (abnormal). Her blood pressure is 156/88 (abnormal) and her pulse is 77. Her respiration is 18 and oxygen saturation is 99%.   Wt Readings from Last 3 Encounters:  11/24/19 203 lb 12.8 oz (92.4 kg)  11/10/19 195 lb (88.5 kg)  10/27/19 200 lb 1.9 oz (90.8 kg)    Physical Exam Vitals reviewed.  HENT:     Head: Normocephalic and atraumatic.  Eyes:     Pupils: Pupils are equal, round, and reactive to light.  Cardiovascular:     Rate and Rhythm: Normal rate and regular rhythm.     Heart sounds: Normal heart sounds.  Pulmonary:     Effort: Pulmonary effort is normal.     Breath sounds: Normal breath sounds.  Abdominal:     General: Bowel sounds are normal.     Palpations: Abdomen is soft.  Musculoskeletal:        General: No tenderness or deformity. Normal range of motion.     Cervical back: Normal range of motion.  Lymphadenopathy:     Cervical: No cervical adenopathy.  Skin:    General: Skin is warm and dry.     Findings: No erythema or rash.  Neurological:     Mental Status: She is alert and oriented to person, place, and time.  Psychiatric:        Behavior: Behavior normal.        Thought Content: Thought content normal.        Judgment: Judgment normal.      Lab Results  Component Value Date   WBC 8.4 11/24/2019   HGB 11.6 (L) 11/24/2019   HCT 35.8 (L) 11/24/2019   MCV 87.7 11/24/2019   PLT 220 11/24/2019   No results found for: FERRITIN, IRON, TIBC, UIBC, IRONPCTSAT Lab Results  Component Value Date   RBC 4.08 11/24/2019   No results found for: KPAFRELGTCHN, LAMBDASER, KAPLAMBRATIO No results found for: IGGSERUM, IGA, IGMSERUM No results found for: Ronnald Ramp,  A1GS, A2GS, Tillman Sers, SPEI   Chemistry      Component Value Date/Time   NA 138 11/10/2019 0820   K 3.8 11/10/2019 0820   CL 102 11/10/2019 0820   CO2 29 11/10/2019 0820   BUN 11 11/10/2019 0820   CREATININE 0.55 11/10/2019 0820   CREATININE 0.78 03/23/2018 1018      Component Value Date/Time   CALCIUM 9.2 11/10/2019 0820   ALKPHOS 56 11/10/2019  0820   AST 26 11/10/2019 0820   ALT 34 11/10/2019 0820   ALT 40 (H) 11/25/2017 0947   BILITOT 0.8 11/10/2019 0820       Impression and Plan: Ms. Melanie Alvarez is a very pleasant 64 yo African American female with adenocarcinoma of the pancreas, ampullary carcinoma.  We will go ahead with her fourth cycle of chemotherapy.  This is chemotherapy being given in the neoadjuvant setting.  Hopefully, we will see that she has responded and she will be able to have surgery.  She is in fantastic shape so she would be able to manage surgery.  We will go ahead and do the CT scans about 3 weeks.  We will then see her back right afterwards.  I will then plan to get her over to see surgical oncology and hopefully they will feel that she is capable and amenable and appropriate for surgical resection.   As far as treatment after surgery, this will really depend upon findings if she undergoes resection.  Marland Kitchen   Volanda Napoleon, MD 4/7/20218:48 AM

## 2019-11-25 LAB — CANCER ANTIGEN 19-9: CA 19-9: <2 U/mL (ref 0–35)

## 2019-11-26 ENCOUNTER — Other Ambulatory Visit: Payer: Self-pay

## 2019-11-26 ENCOUNTER — Inpatient Hospital Stay: Payer: BC Managed Care – PPO

## 2019-11-26 VITALS — BP 158/80 | HR 88 | Temp 97.7°F | Resp 18

## 2019-11-26 DIAGNOSIS — Z5111 Encounter for antineoplastic chemotherapy: Secondary | ICD-10-CM | POA: Diagnosis not present

## 2019-11-26 DIAGNOSIS — C251 Malignant neoplasm of body of pancreas: Secondary | ICD-10-CM

## 2019-11-26 MED ORDER — HEPARIN SOD (PORK) LOCK FLUSH 100 UNIT/ML IV SOLN
500.0000 [IU] | Freq: Once | INTRAVENOUS | Status: AC | PRN
  Administered 2019-11-26: 14:00:00 500 [IU]
  Filled 2019-11-26: qty 5

## 2019-11-26 MED ORDER — SODIUM CHLORIDE 0.9% FLUSH
10.0000 mL | INTRAVENOUS | Status: DC | PRN
  Administered 2019-11-26: 10 mL
  Filled 2019-11-26: qty 10

## 2019-11-30 ENCOUNTER — Telehealth: Payer: Self-pay | Admitting: *Deleted

## 2019-11-30 NOTE — Telephone Encounter (Signed)
Message received from patient stating that she started with slight cramping in her hands yesterday and today and would like to know if there is anything she can take to help it.  Dr. Marin Olp notified.  Call placed back to patient and message left to notify pt per order of Dr. Marin Olp to take Vitamin B6 OTC daily.  Instructed pt to call office back with any questions or concerns.

## 2019-12-16 ENCOUNTER — Inpatient Hospital Stay: Payer: BC Managed Care – PPO

## 2019-12-16 ENCOUNTER — Telehealth: Payer: Self-pay | Admitting: Family

## 2019-12-16 ENCOUNTER — Ambulatory Visit (HOSPITAL_BASED_OUTPATIENT_CLINIC_OR_DEPARTMENT_OTHER)
Admission: RE | Admit: 2019-12-16 | Discharge: 2019-12-16 | Disposition: A | Discharge status: Home / Self Care | Payer: BC Managed Care – PPO | Source: Ambulatory Visit | Attending: Hematology & Oncology | Admitting: Hematology & Oncology

## 2019-12-16 ENCOUNTER — Encounter: Payer: Self-pay | Admitting: Family

## 2019-12-16 ENCOUNTER — Encounter (HOSPITAL_BASED_OUTPATIENT_CLINIC_OR_DEPARTMENT_OTHER): Payer: Self-pay

## 2019-12-16 ENCOUNTER — Inpatient Hospital Stay (HOSPITAL_BASED_OUTPATIENT_CLINIC_OR_DEPARTMENT_OTHER): Payer: BC Managed Care – PPO | Admitting: Family

## 2019-12-16 ENCOUNTER — Other Ambulatory Visit: Payer: Self-pay

## 2019-12-16 VITALS — BP 150/91 | HR 84 | Temp 97.1°F | Resp 18 | Ht 63.5 in | Wt 204.0 lb

## 2019-12-16 DIAGNOSIS — C251 Malignant neoplasm of body of pancreas: Secondary | ICD-10-CM

## 2019-12-16 DIAGNOSIS — C250 Malignant neoplasm of head of pancreas: Secondary | ICD-10-CM | POA: Diagnosis not present

## 2019-12-16 DIAGNOSIS — Z5111 Encounter for antineoplastic chemotherapy: Secondary | ICD-10-CM | POA: Diagnosis not present

## 2019-12-16 LAB — CBC WITH DIFFERENTIAL (CANCER CENTER ONLY)
Abs Immature Granulocytes: 0.07 10*3/uL (ref 0.00–0.07)
Basophils Absolute: 0.1 10*3/uL (ref 0.0–0.1)
Basophils Relative: 1 %
Eosinophils Absolute: 0.1 10*3/uL (ref 0.0–0.5)
Eosinophils Relative: 1 %
HCT: 37 % (ref 36.0–46.0)
Hemoglobin: 11.9 g/dL — ABNORMAL LOW (ref 12.0–15.0)
Immature Granulocytes: 1 %
Lymphocytes Relative: 33 %
Lymphs Abs: 3.4 10*3/uL (ref 0.7–4.0)
MCH: 27.5 pg (ref 26.0–34.0)
MCHC: 32.2 g/dL (ref 30.0–36.0)
MCV: 85.6 fL (ref 80.0–100.0)
Monocytes Absolute: 0.6 10*3/uL (ref 0.1–1.0)
Monocytes Relative: 6 %
Neutro Abs: 5.9 10*3/uL (ref 1.7–7.7)
Neutrophils Relative %: 58 %
Platelet Count: 322 10*3/uL (ref 150–400)
RBC: 4.32 MIL/uL (ref 3.87–5.11)
RDW: 13.2 % (ref 11.5–15.5)
WBC Count: 10.2 10*3/uL (ref 4.0–10.5)
nRBC: 0 % (ref 0.0–0.2)

## 2019-12-16 LAB — CMP (CANCER CENTER ONLY)
ALT: 25 U/L (ref 0–44)
AST: 26 U/L (ref 15–41)
Albumin: 3.9 g/dL (ref 3.5–5.0)
Alkaline Phosphatase: 53 U/L (ref 38–126)
Anion gap: 9 (ref 5–15)
BUN: 10 mg/dL (ref 8–23)
CO2: 28 mmol/L (ref 22–32)
Calcium: 9.8 mg/dL (ref 8.9–10.3)
Chloride: 101 mmol/L (ref 98–111)
Creatinine: 0.53 mg/dL (ref 0.44–1.00)
GFR, Est AFR Am: >60 mL/min (ref >60)
GFR, Estimated: >60 mL/min (ref >60)
Glucose, Bld: 104 mg/dL — ABNORMAL HIGH (ref 70–99)
Potassium: 3.4 mmol/L — ABNORMAL LOW (ref 3.5–5.1)
Sodium: 138 mmol/L (ref 135–145)
Total Bilirubin: 0.4 mg/dL (ref 0.3–1.2)
Total Protein: 7.2 g/dL (ref 6.5–8.1)

## 2019-12-16 IMAGING — CT CT ABD-PELV W/ CM
2 of 6 series · 13 of 36 positions shown, 16 images · IV contrast (Omnipaque)
Comparison: CT chest, [DATE], MR abdomen, [DATE]

CLINICAL DATA: Pancreatic cancer, assess treatment response

EXAM:
CT CHEST, ABDOMEN, AND PELVIS WITH CONTRAST
TECHNIQUE: Multidetector CT imaging of the chest, abdomen and pelvis was
performed following the standard protocol during bolus
administration of intravenous contrast.
CONTRAST:  100mL OMNIPAQUE IOHEXOL 300 MG/ML SOLN, additional oral
enteric contrast

[Series 2: cap with 2 · axial · 0.98mm/px · z∈[-598,-93]mm · 10 of 125 slices shown, 13 images]
[im 12/125  mediastinal]
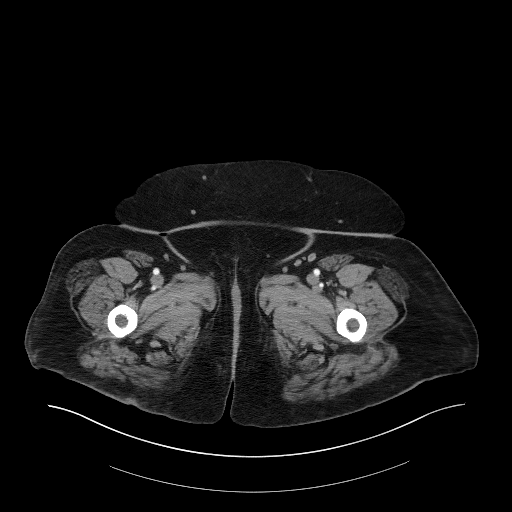
[im 12/125  lung]
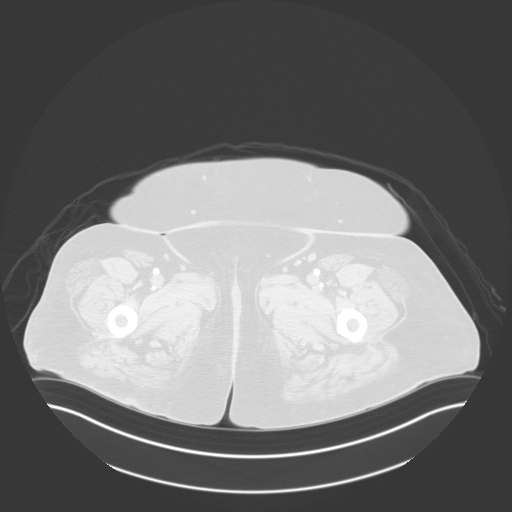
[im 23/125  lung]
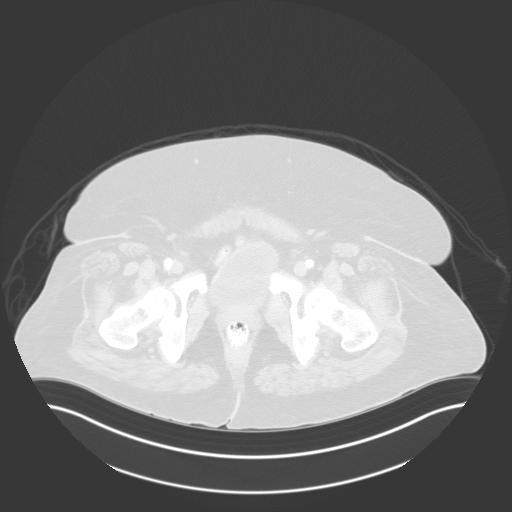
[im 34/125  lung]
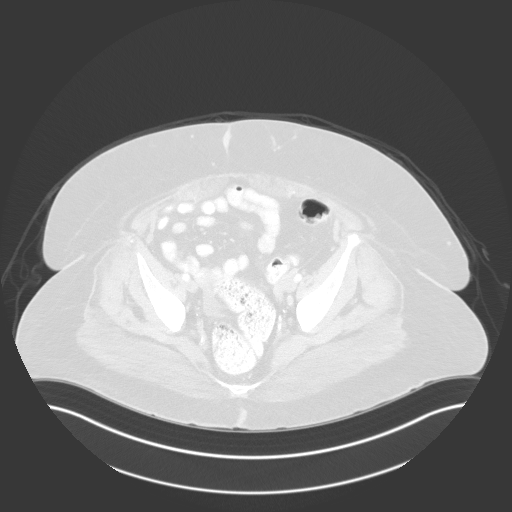
[im 46/125  lung]
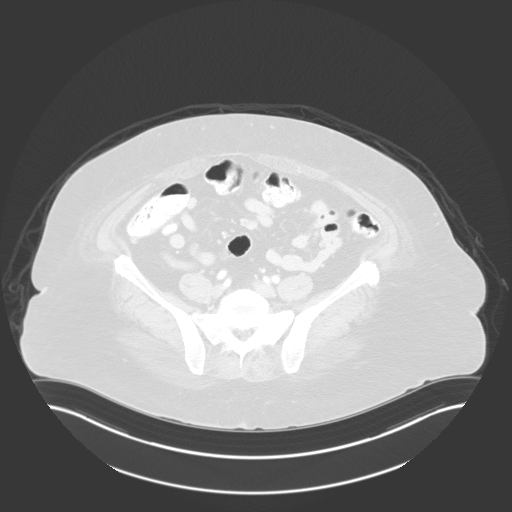
[im 57/125  mediastinal]
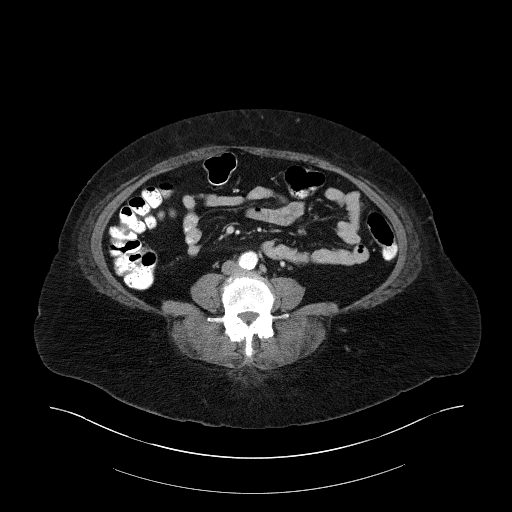
[im 57/125  lung]
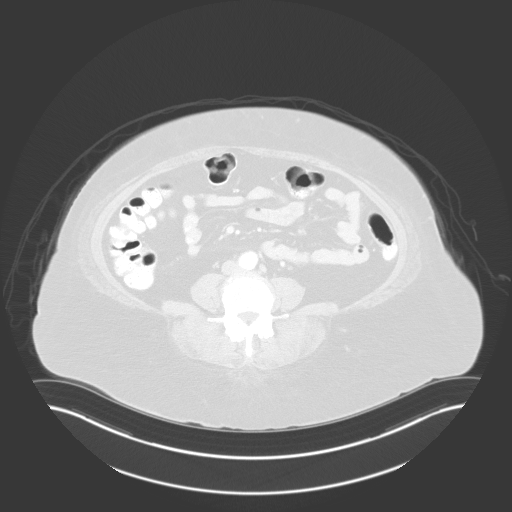
[im 68/125  lung]
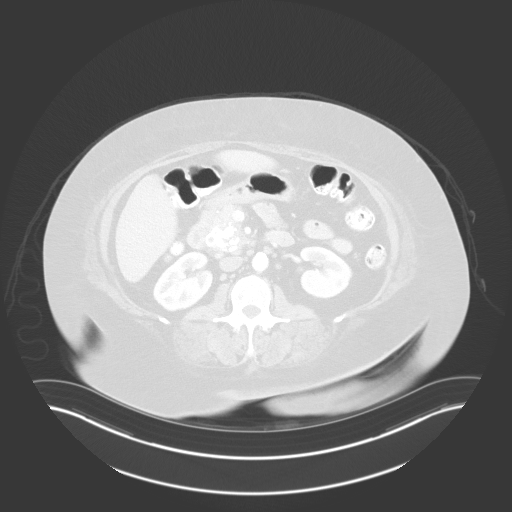
[im 79/125  lung]
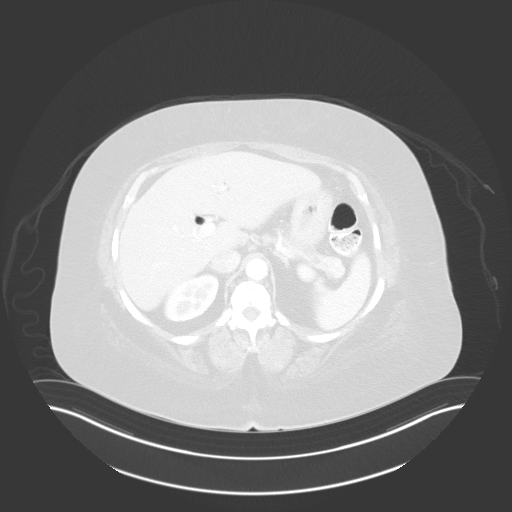
[im 91/125  lung]
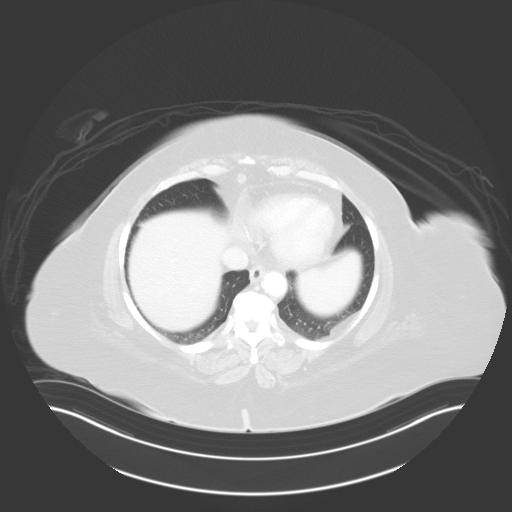
[im 102/125  mediastinal]
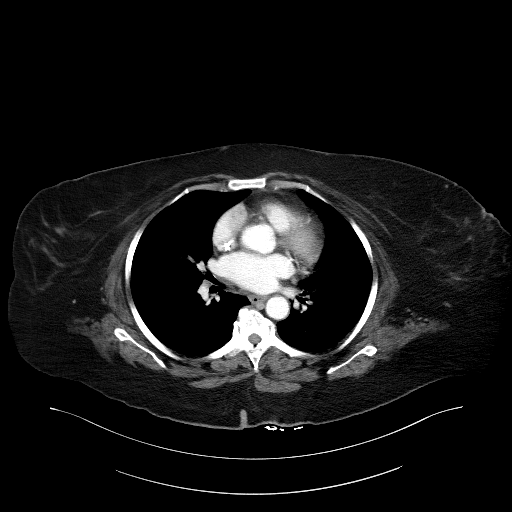
[im 102/125  lung]
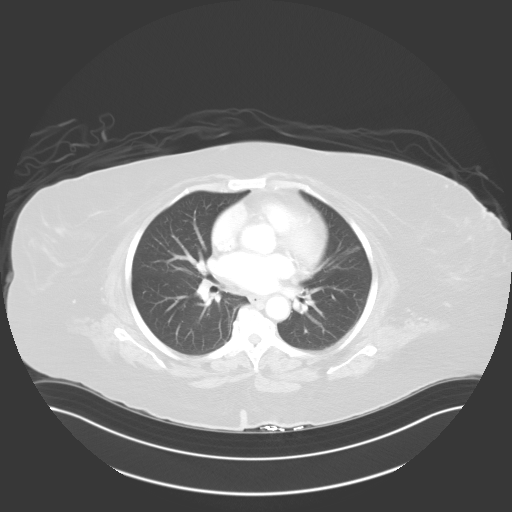
[im 113/125  lung]
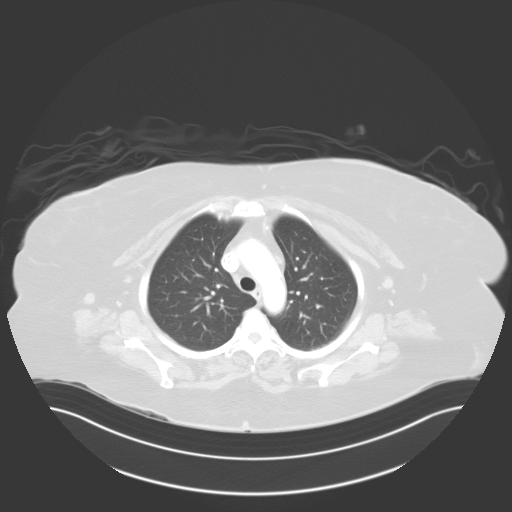

[Series 5: coronals · coronal · 1.00mm/px · 3 of 164 slices shown]
[im 33/164  lung]
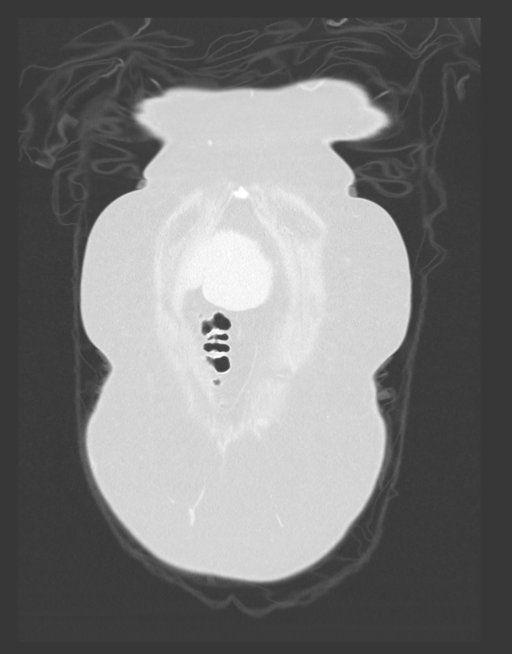
[im 66/164  lung]
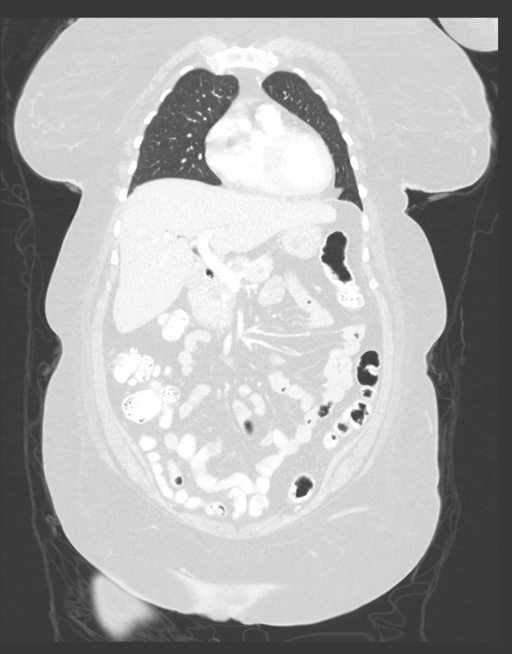
[im 98/164  lung]
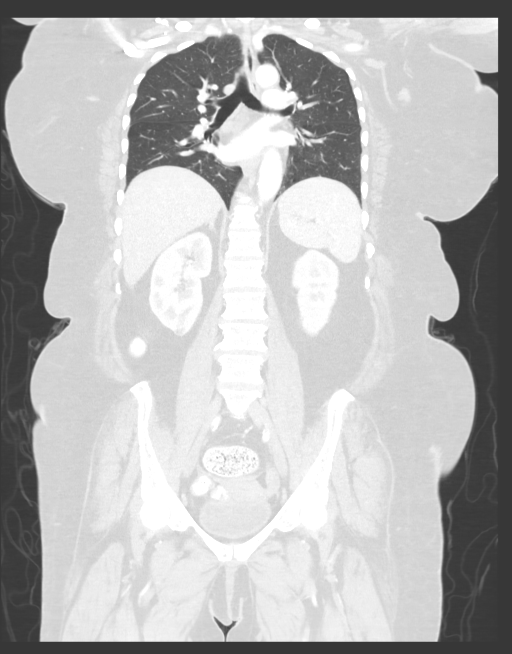

[13 of 36 positions shown; findings below may reference images not displayed]

FINDINGS: CT CHEST FINDINGS

Cardiovascular: Aortic atherosclerosis. Aortic valve calcifications.
Normal heart size. No pericardial effusion.

Mediastinum/Nodes: Unchanged prominent bilateral axillary lymph
nodes. Unchanged 0.9 cm epicardial lymph node (series 2, image 35).
Thyroid gland, trachea, and esophagus demonstrate no significant
findings.

Lungs/Pleura: Lungs are clear. No pleural effusion or pneumothorax.

Musculoskeletal: No chest wall mass or suspicious bone lesions
identified.

CT ABDOMEN PELVIS FINDINGS

Hepatobiliary: No solid liver abnormality is seen. No gallstones or
gallbladder wall thickening. Common bile duct stent with post
stenting pneumobilia.

Pancreas: Redemonstrated, ill-defined calcified hypodense mass of
the pancreatic head measuring approximately 3.0 x 2.9 cm, not
significantly changed. There is pancreatic ductal dilatation up to
1.4 cm. This appears to abut but does not encase the most central
portion of the superior mesenteric and portal veins (series 2, image
54). The celiac axis and superior mesenteric artery are not
involved.

Spleen: Normal in size without significant abnormality.

Adrenals/Urinary Tract: Adrenal glands are unremarkable.
Redemonstrated, contrast enhancing mixed solid and cystic lesion of
the inferior pole of the right kidney measuring 1.2 cm. Bladder is
unremarkable.

Stomach/Bowel: Stomach is within normal limits. Appendix appears
normal. No evidence of bowel wall thickening, distention, or
inflammatory changes.

Vascular/Lymphatic: Aortic atherosclerosis. Prominent subcentimeter
celiac axis, portacaval, and retroperitoneal lymph nodes are
unchanged.

Reproductive: No mass or other abnormality.

Other: No abdominal wall hernia or abnormality. No abdominopelvic
ascites.

Musculoskeletal: No acute or significant osseous findings.
IMPRESSION: 1. Redemonstrated, ill-defined calcified hypodense mass of the
pancreatic head measuring approximately 3.0 x 2.9 cm, not
significantly changed. This mass appears to abut but does not encase
the most central portion of the superior mesenteric and portal
veins. The celiac axis and superior mesenteric artery are not
involved.

2. No definite evidence of metastatic disease in the chest, abdomen,
or pelvis.

3. Prominent, nonspecific epicardial, celiac axis, portacaval, and
retroperitoneal lymph nodes, unchanged. Attention on follow-up.

4. Status post common bile duct stenting with post stenting
pneumobilia.

5. Redemonstrated, contrast enhancing mixed solid and cystic lesion
of the inferior pole of the right kidney measuring 1.2 cm, which
remains concerning for a small renal cell carcinoma.

6.  Aortic Atherosclerosis ([AH]-[AH]).

## 2019-12-16 IMAGING — CT CT CHEST W/ CM
2 of 5 series · 14 of 36 positions shown, 17 images · IV contrast (Omnipaque)
Comparison: CT chest, [DATE], MR abdomen, [DATE]

CLINICAL DATA: Pancreatic cancer, assess treatment response

EXAM:
CT CHEST, ABDOMEN, AND PELVIS WITH CONTRAST
TECHNIQUE: Multidetector CT imaging of the chest, abdomen and pelvis was
performed following the standard protocol during bolus
administration of intravenous contrast.
CONTRAST:  100mL OMNIPAQUE IOHEXOL 300 MG/ML SOLN, additional oral
enteric contrast

[Series 2: cap with 2 · axial · 0.98mm/px · z∈[-603,-88]mm · 11 of 125 slices shown, 14 images]
[im 11/125  mediastinal]
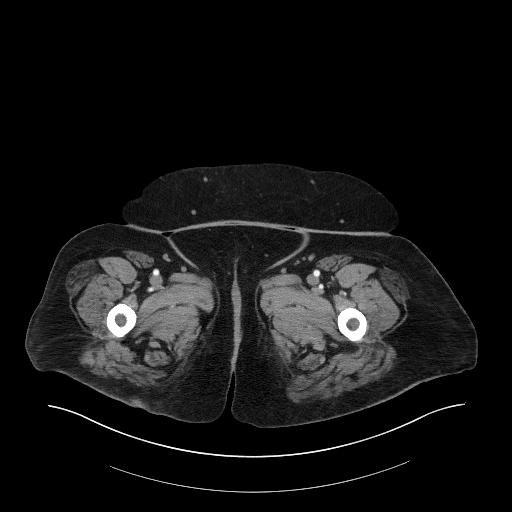
[im 11/125  lung]
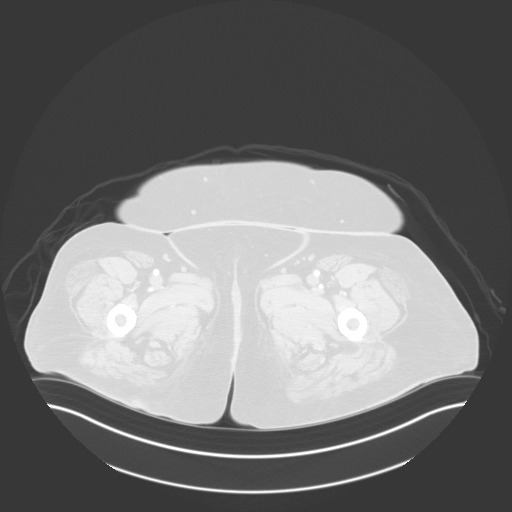
[im 21/125  lung]
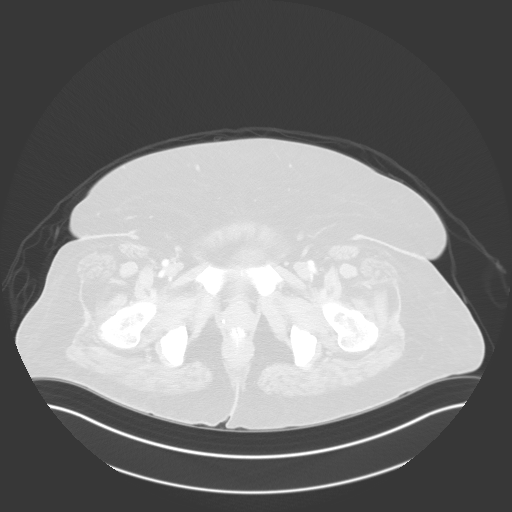
[im 32/125  lung]
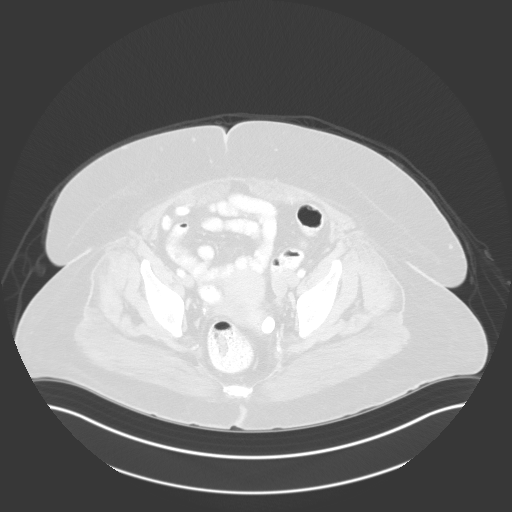
[im 42/125  lung]
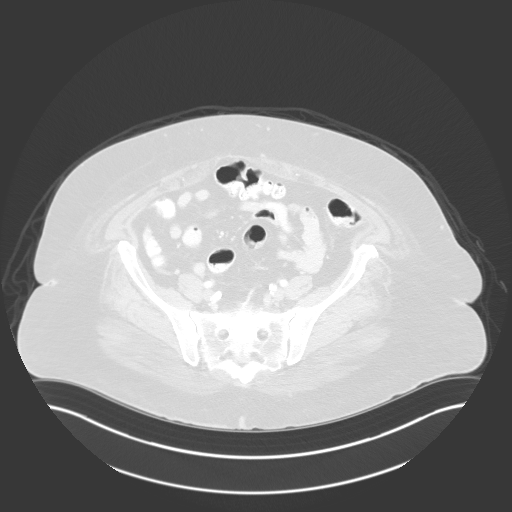
[im 52/125  mediastinal]
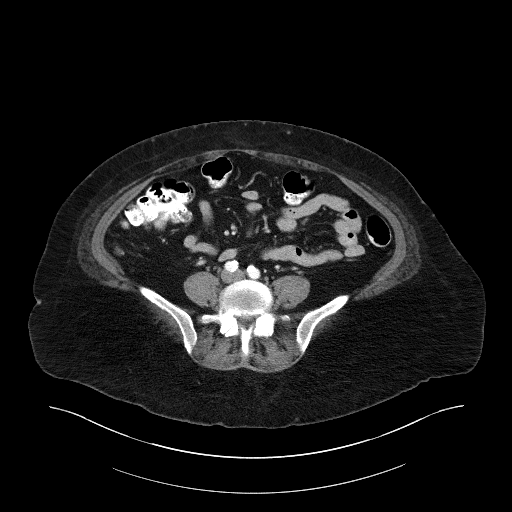
[im 52/125  lung]
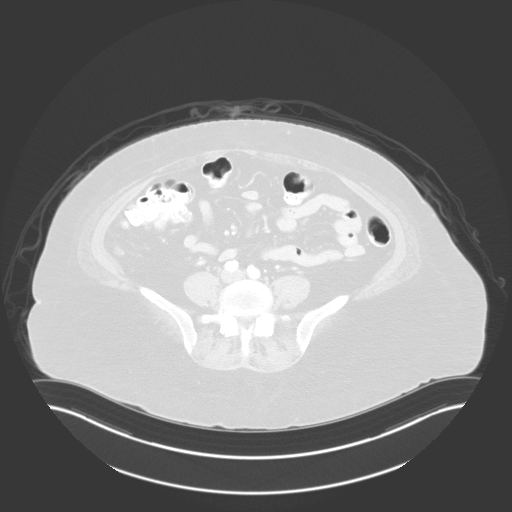
[im 63/125  lung]
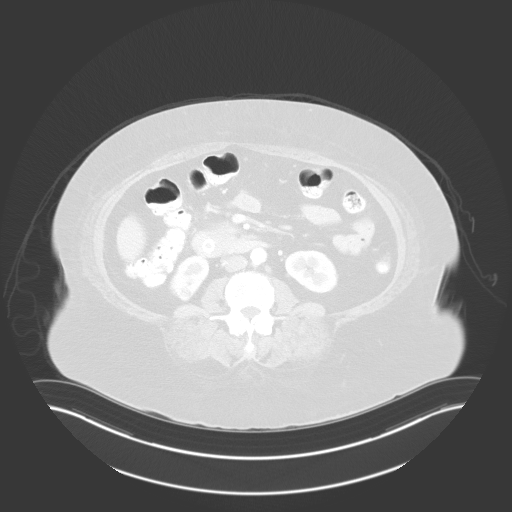
[im 73/125  lung]
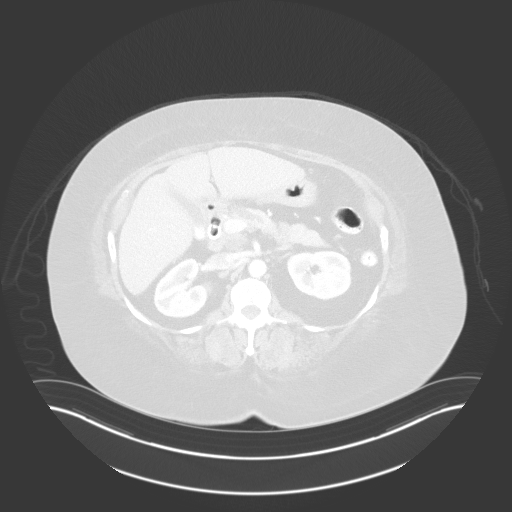
[im 83/125  lung]
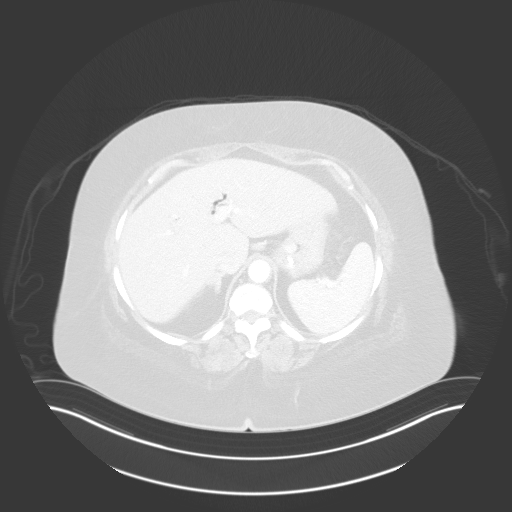
[im 94/125  mediastinal]
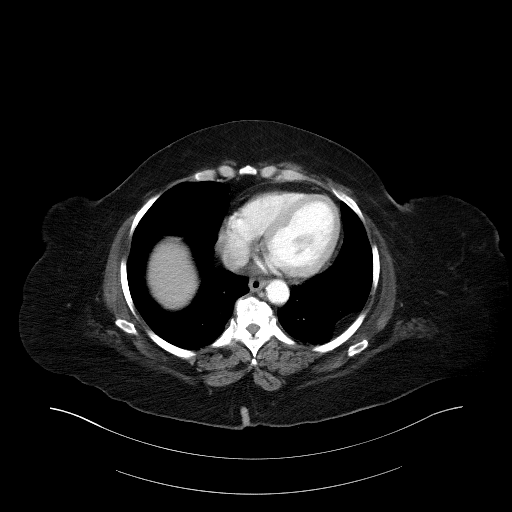
[im 94/125  lung]
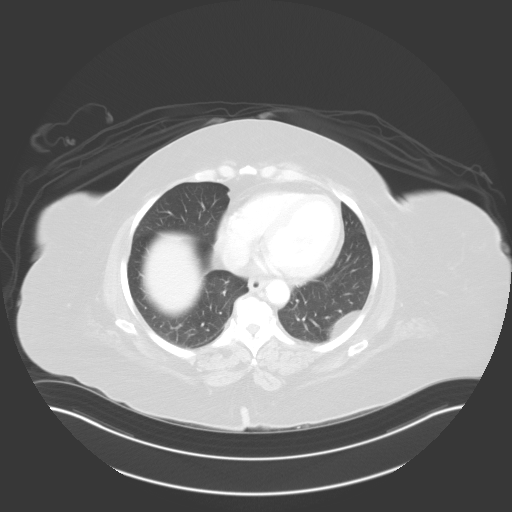
[im 104/125  lung]
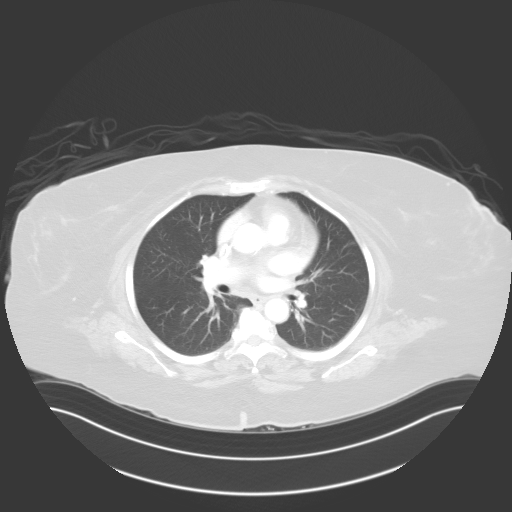
[im 114/125  lung]
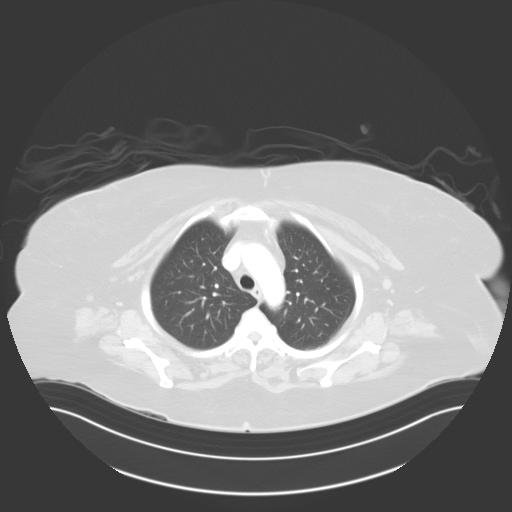

[Series 5: coronals · coronal · 1.00mm/px · 3 of 164 slices shown]
[im 33/164  lung]
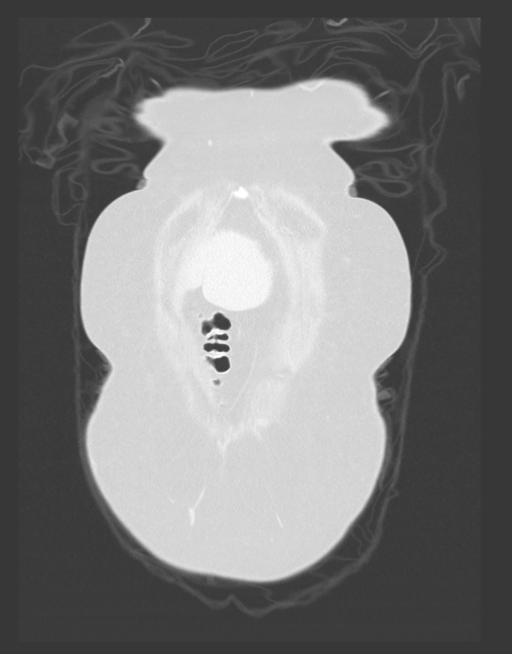
[im 66/164  lung]
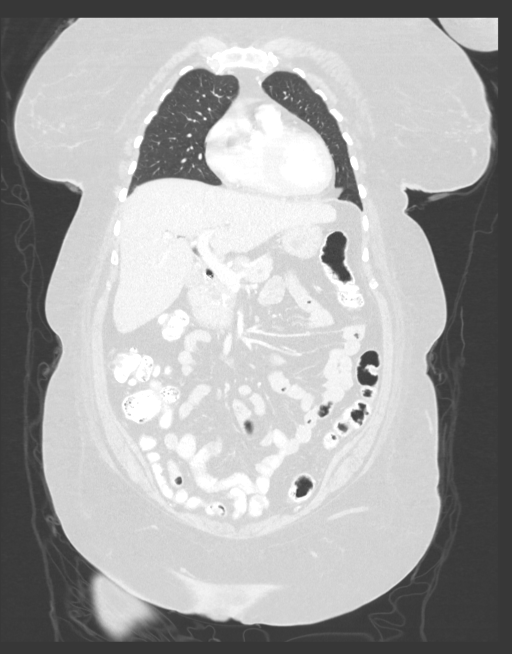
[im 98/164  lung]
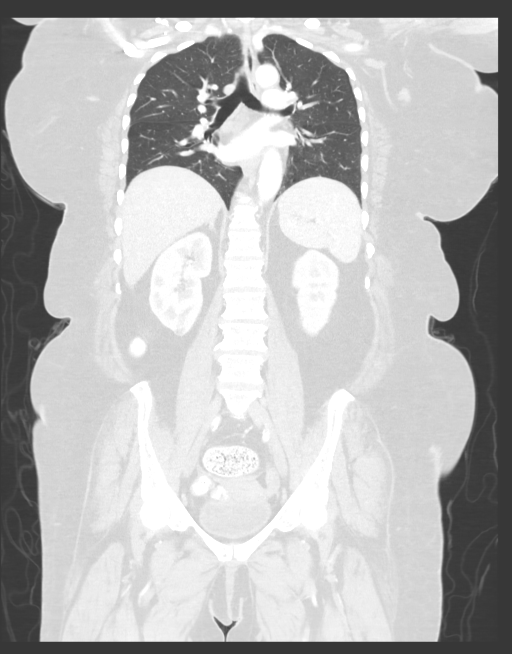

[14 of 36 positions shown; findings below may reference images not displayed]

FINDINGS: CT CHEST FINDINGS

Cardiovascular: Aortic atherosclerosis. Aortic valve calcifications.
Normal heart size. No pericardial effusion.

Mediastinum/Nodes: Unchanged prominent bilateral axillary lymph
nodes. Unchanged 0.9 cm epicardial lymph node (series 2, image 35).
Thyroid gland, trachea, and esophagus demonstrate no significant
findings.

Lungs/Pleura: Lungs are clear. No pleural effusion or pneumothorax.

Musculoskeletal: No chest wall mass or suspicious bone lesions
identified.

CT ABDOMEN PELVIS FINDINGS

Hepatobiliary: No solid liver abnormality is seen. No gallstones or
gallbladder wall thickening. Common bile duct stent with post
stenting pneumobilia.

Pancreas: Redemonstrated, ill-defined calcified hypodense mass of
the pancreatic head measuring approximately 3.0 x 2.9 cm, not
significantly changed. There is pancreatic ductal dilatation up to
1.4 cm. This appears to abut but does not encase the most central
portion of the superior mesenteric and portal veins (series 2, image
54). The celiac axis and superior mesenteric artery are not
involved.

Spleen: Normal in size without significant abnormality.

Adrenals/Urinary Tract: Adrenal glands are unremarkable.
Redemonstrated, contrast enhancing mixed solid and cystic lesion of
the inferior pole of the right kidney measuring 1.2 cm. Bladder is
unremarkable.

Stomach/Bowel: Stomach is within normal limits. Appendix appears
normal. No evidence of bowel wall thickening, distention, or
inflammatory changes.

Vascular/Lymphatic: Aortic atherosclerosis. Prominent subcentimeter
celiac axis, portacaval, and retroperitoneal lymph nodes are
unchanged.

Reproductive: No mass or other abnormality.

Other: No abdominal wall hernia or abnormality. No abdominopelvic
ascites.

Musculoskeletal: No acute or significant osseous findings.
IMPRESSION: 1. Redemonstrated, ill-defined calcified hypodense mass of the
pancreatic head measuring approximately 3.0 x 2.9 cm, not
significantly changed. This mass appears to abut but does not encase
the most central portion of the superior mesenteric and portal
veins. The celiac axis and superior mesenteric artery are not
involved.

2. No definite evidence of metastatic disease in the chest, abdomen,
or pelvis.

3. Prominent, nonspecific epicardial, celiac axis, portacaval, and
retroperitoneal lymph nodes, unchanged. Attention on follow-up.

4. Status post common bile duct stenting with post stenting
pneumobilia.

5. Redemonstrated, contrast enhancing mixed solid and cystic lesion
of the inferior pole of the right kidney measuring 1.2 cm, which
remains concerning for a small renal cell carcinoma.

6.  Aortic Atherosclerosis ([AH]-[AH]).

## 2019-12-16 MED ORDER — IOHEXOL 300 MG/ML  SOLN
100.0000 mL | Freq: Once | INTRAMUSCULAR | Status: AC | PRN
  Administered 2019-12-16: 100 mL via INTRAVENOUS

## 2019-12-16 NOTE — Progress Notes (Signed)
Hematology and Oncology Follow Up Visit  Melanie Alvarez [id number] [birth year] 64 y.o. 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 This showed stable mass of the pancreatic head and  I reviewed these with Melanie Alvarez as well and stable nonspecific epicardial, celiac axis, portacaval, and retroperitoneal lymph nodes. There is also a 1.2 cm solid and cystic lesion on the right kidney that we are watching.  CA 19.9 earlier this month was < 2.  We will get her set up for surgery to resect the pancreatic mass with Melanie Alvarez.  She is doing well and has no complaints at this time.  No fever, chills, n/v, cough, rash, dizziness, SOB, chest pain, palpitations, abdominal pain or changes in bowel or bladder habits.  No tenderness, numbness or tingling in her extremities at this time.  She has occasional swelling in the left ankle that comes and goes. She states that she has injured that ankle in the past. No redness or edema noted on exam.  No falls or syncopal episodes to report.  She states that she is eating well and staying properly hydrated. Her weight is stable.   ECOG Performance Status: 1 - Symptomatic but completely ambulatory  Medications:  Allergies as of 12/16/2019      Reactions   Lisinopril Swelling   Facial and upper lip      Medication List       Accurate as of December 16, 2019 10:44 AM. If you have any questions, ask your nurse or doctor.        allopurinol 100 MG tablet Commonly known as: ZYLOPRIM Take 1 tablet (100 mg total) by mouth 3 (three) times daily after meals.   amLODipine 10 MG tablet Commonly known as: NORVASC Take 1 tablet (10 mg total) by mouth at bedtime.   atorvastatin 20 MG tablet Commonly  known as: LIPITOR Take 20 mg by mouth daily.   cholecalciferol 10 MCG (400 UNIT) Tabs tablet Commonly known as: VITAMIN D3 Take 400 Units by mouth daily.   dexamethasone 4 MG tablet Commonly known as: DECADRON Take 2 tablets (8 mg total) by mouth daily. Start the day after chemotherapy for 3 days. Take with food.   glimepiride 2 MG tablet Commonly known as: Amaryl Take 1 tablet (2 mg total) by mouth daily with breakfast.   glucose blood test strip Test blood glucose once daily. One Touch Verio test strips   Lancet Device Misc Check blood glucose once daily. One TCH 99991111 Delicapl LNC   Lecithin 1200 MG Caps Take by mouth.   lidocaine-prilocaine cream Commonly known as: EMLA Apply 1 application topically as needed.   lipase/protease/amylase 36000 UNITS Cpep capsule Commonly known as: Creon Take 2 capsules (72,000 Units total) by mouth 3 (three) times daily before meals. What changed:   how much to take  when to take this  reasons to take this   loperamide 2 MG tablet Commonly known as: Imodium A-D Take 2 at onset of diarrhea, then 1 every 2hrs until 12hr without a BM. May take 2 tab every 4hrs at bedtime. If diarrhea recurs repeat.   LORazepam 0.5 MG tablet Commonly known as: Ativan Take 1 tablet (0.5 mg total) by  mouth every 6 (six) hours as needed for anxiety.   metFORMIN 500 MG tablet Commonly known as: GLUCOPHAGE Take 1 tablet (500 mg total) by mouth 2 (two) times daily with a meal.   metoprolol succinate 25 MG 24 hr tablet Commonly known as: TOPROL-XL Take 1 tablet (25 mg total) by mouth daily. Take with or immediately following a meal.   olmesartan 5 MG tablet Commonly known as: BENICAR Take 1 tablet (5 mg total) by mouth daily.   ondansetron 8 MG tablet Commonly known as: Zofran Take 1 tablet (8 mg total) by mouth 2 (two) times daily as needed. Start on day 3 after chemotherapy.   pantoprazole 40 MG tablet Commonly known as: PROTONIX Take 1 tablet  (40 mg total) by mouth 2 (two) times daily.   prochlorperazine 10 MG tablet Commonly known as: COMPAZINE Take 1 tablet (10 mg total) by mouth every 6 (six) hours as needed (Nausea or vomiting).   TURMERIC CURCUMIN PO Take by mouth 2 (two) times daily.       Allergies:  Allergies  Allergen Reactions  . Lisinopril Swelling    Facial and upper lip    Past Medical History, Surgical history, Social history, and Family History were reviewed and updated.  Review of Systems: All other 10 point review of systems is negative.   Physical Exam:  height is 5' 3.5" (1.613 m) and weight is 204 lb (92.5 kg). Her temporal temperature is 97.1 F (36.2 C) (abnormal). Her blood pressure is 150/91 (abnormal) and her pulse is 84. Her respiration is 18 and oxygen saturation is 100%.   Wt Readings from Last 3 Encounters:  12/16/19 204 lb (92.5 kg)  11/24/19 203 lb 12.8 oz (92.4 kg)  11/10/19 195 lb (88.5 kg)    Ocular: Sclerae unicteric, pupils equal, round and reactive to light Ear-nose-throat: Oropharynx clear, dentition fair Lymphatic: No cervical or supraclavicular adenopathy Lungs no rales or rhonchi, good excursion bilaterally Heart regular rate and rhythm, no murmur appreciated Abd soft, nontender, positive bowel sounds, no liver or spleen tip palpated on exam, no fluid wave  MSK no focal spinal tenderness, no joint edema Neuro: non-focal, well-oriented, appropriate affect Breasts: Deferred   Lab Results  Component Value Date   WBC 10.2 12/16/2019   HGB 11.9 (L) 12/16/2019   HCT 37.0 12/16/2019   MCV 85.6 12/16/2019   PLT 322 12/16/2019   No results found for: FERRITIN, IRON, TIBC, UIBC, IRONPCTSAT Lab Results  Component Value Date   RBC 4.32 12/16/2019   No results found for: KPAFRELGTCHN, LAMBDASER, KAPLAMBRATIO No results found for: IGGSERUM, IGA, IGMSERUM No results found for: Odetta Pink, SPEI   Chemistry        Component Value Date/Time   NA 140 11/24/2019 0833   K 3.5 11/24/2019 0833   CL 102 11/24/2019 0833   CO2 31 11/24/2019 0833   BUN 11 11/24/2019 0833   CREATININE 0.56 11/24/2019 0833   CREATININE 0.78 03/23/2018 1018      Component Value Date/Time   CALCIUM 9.5 11/24/2019 0833   ALKPHOS 52 11/24/2019 0833   AST 29 11/24/2019 0833   ALT 37 11/24/2019 0833   ALT 40 (H) 11/25/2017 0947   BILITOT 0.5 11/24/2019 0833       Impression and Plan: Ms. Valentini is a very pleasant 64 yo African American female with adenocarcinoma of the pancreas, ampullary carcinoma. She has completed 4 cycles of FOLFIRINOX and tolerated well.  CT scans  showed stable disease of the pancreatic head and she is now ready to proceed with surgery.  I will reach out to Melanie Alvarez and let her know patient has completed neoadjuvant chemotherapy.  We will plan to see her back in another 8 weeks.  She will contact our office with any questions or concerns. We can certainly see her sooner if needed.   Laverna Peace, NP 4/29/202110:44 AM

## 2019-12-16 NOTE — Telephone Encounter (Signed)
Appointments scheduled calendar printed per 4/29 los 

## 2019-12-16 NOTE — Patient Instructions (Signed)

## 2019-12-17 ENCOUNTER — Other Ambulatory Visit: Payer: Self-pay | Admitting: Family

## 2019-12-17 DIAGNOSIS — C251 Malignant neoplasm of body of pancreas: Secondary | ICD-10-CM

## 2019-12-17 LAB — CANCER ANTIGEN 19-9: CA 19-9: <2 U/mL (ref 0–35)

## 2019-12-20 ENCOUNTER — Other Ambulatory Visit: Payer: Self-pay | Admitting: Family

## 2020-01-12 NOTE — Progress Notes (Signed)
GI Location of Tumor / Histology: Adenocarcinoma of the pancreas- ampullary carcinoma  [name] presented   She was found to have a mass in the head/uncinate process of the pancreas with biliary obstruction and abutment of the SMV/portal vein.  EUs/ERCP 09/25/2019: An irregular mass was identified in the pancreatic head consistent with recently biopsied lesion.  The mass was hypoechoic.  The mass measured 27 mm by 25 mm in maximal cross sectional diameter.  The outer margins were irregular.  CT Chest 09/23/2019: No acute intrathoracic pathology.  No CT evidence of intrathoracic metastatic disease.  A single mildly enlarged and rounded lymph node at the right cardiophrenic angle measures 11 mm in diameter.  MRI Abd 09/20/2019: Signs of pancreatic/ ampullary mass with extension into duodenum and obstruction of biliary and pancreatic ducts.  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   Biopsies of Stomach and Ampullary 09/22/2019   Past/Anticipated interventions by surgeon, if any:  Dr. Barry Dienes 01/10/2020 - Patient does not want to have surgery given the extent of surgery, recovery period, high risk of recurrence, and risks. -I discussed all of this in detail with her and she is not interested in pursuing. -I reviewed briefly stereotactic radiation and she is willing to discuss this with Dr. Lisbeth Renshaw.  I will make referral.  I will also let Dr. Marin Olp know what is going on.   Past/Anticipated interventions by medical oncology, if any:  Dr. Marin Olp 11/24/2019 -We will go ahead with her fourth cycle of chemotherapy- FOLFIRINOX.  Chemotherapy being given in the neoadjuvant setting. -Hopefully we will see that she has responded and she will be able to have surgery. -She is in fantastic shape so she would be able to manage surgery. -We will go ahead and do the CT scans about 3 weeks.  We will then see her back right afterwards.  I will then plan to get her  over to see surgical oncology and hopefully they will feel that she is capable and amenable and appropriate for surgical resection.    Weight changes, if any: No  Bowel/Bladder complaints, if any: Has some constipation due to changes in her diet.  Nausea / Vomiting, if any: No  Pain issues, if any:  No  SAFETY ISSUES:  Prior radiation? No  Pacemaker/ICD? No  Possible current pregnancy? Postmenopausal  Is the patient on methotrexate? No  Current Complaints/Details: Has Port

## 2020-01-13 ENCOUNTER — Ambulatory Visit
Admission: RE | Admit: 2020-01-13 | Discharge: 2020-01-13 | Disposition: A | Discharge status: Home / Self Care | Payer: BC Managed Care – PPO | Source: Ambulatory Visit | Attending: Radiation Oncology | Admitting: Radiation Oncology

## 2020-01-13 ENCOUNTER — Other Ambulatory Visit: Payer: Self-pay

## 2020-01-13 ENCOUNTER — Encounter: Payer: Self-pay | Admitting: Radiation Oncology

## 2020-01-13 VITALS — Ht 63.5 in | Wt 210.0 lb

## 2020-01-13 DIAGNOSIS — C251 Malignant neoplasm of body of pancreas: Secondary | ICD-10-CM

## 2020-01-13 DIAGNOSIS — C250 Malignant neoplasm of head of pancreas: Secondary | ICD-10-CM

## 2020-01-13 NOTE — Progress Notes (Addendum)
Radiation Oncology         [phone] ________________________________  Initial Outpatient Consultation - Conducted via telephone due to current COVID-19 concerns for limiting patient exposure  I spoke with the patient to conduct this consult visit via telephone to spare the patient unnecessary potential exposure in the healthcare setting during the current COVID-19 pandemic. The patient was notified in advance and was offered a [place] meeting to allow for face to face communication but unfortunately reported that they did not have the appropriate resources/technology to support such a visit and instead preferred to proceed with a telephone consult.    Name: Melanie Alvarez        MRN: [id number]  Date of Service: 01/13/2020 DOB: [birth year]  CB:JSEG, Charlene Brooke, NP  Stark Klein, MD     REFERRING PHYSICIAN: Stark Klein, MD   DIAGNOSIS: The encounter diagnosis was Malignant neoplasm of body of pancreas ([facility]).   HISTORY OF PRESENT ILLNESS: Melanie Alvarez is a 64 y.o. female seen at the request of Dr. Marin Olp for a recently diagnosed pancreatic cancer.  She was diagnosed in February 2021, her work-up included evaluation of her abdomen following her bilirubin levels being elevated.  An MRI of the abdomen 09/20/2019 in the hospital showed signs of pancreatic/ampullary mass with extension into the duodenum and obstruction of the biliary and pancreatic ducts, there was concern for nodal enlargement in the upper abdomen and lower chest, and further evaluation of the chest was also performed which revealed no evidence of metastatic disease.  She had undergone an ERCP with common bile duct stent placing, biopsy of the ampulla revealed duodenal mucosa with reactive changes, a stomach biopsy revealed gastritis, a gastrohepatic lymph node FNA revealed lymphoid tissue without carcinoma and fine-needle aspirate of the pancreatic head revealed malignancy consistent with adenocarcinoma.  She was  classified as stage III, her vessels however on that MRI of the abdomen performed revealed patent SMV and portal vein as well as patent SMA and no signs of vascular involvement.  She was started on neoadjuvant chemotherapy and completed approximately 4 cycles.  She met with Dr. Barry Dienes on 01/10/2020, and was offered surgical resection but was not interested given the extent of surgery, recovery period and high risks of recurrence.  She is contacted today to discuss options of stereotactic body radiotherapy to the pancreas.     PREVIOUS RADIATION THERAPY: No   PAST MEDICAL HISTORY:  Past Medical History:  Diagnosis Date  . Cirrhosis ([place])   . Diabetes mellitus 2012  . Diverticulosis   . Gallbladder sludge   . Hepatitis 2010   history of Hepatitis C  . Hepatitis C   . Hypertension 2011  . Obesity        PAST SURGICAL HISTORY: Past Surgical History:  Procedure Laterality Date  . BILIARY BRUSHING  09/22/2019   Procedure: BILIARY BRUSHING;  Surgeon: Rush Landmark Telford Nab., MD;  Location: [facility];  Service: Gastroenterology;;  . BILIARY DILATION  09/22/2019   Procedure: BILIARY DILATION;  Surgeon: Irving Copas., MD;  Location: [facility];  Service: Gastroenterology;;  . BILIARY STENT PLACEMENT N/A 09/22/2019   Procedure: BILIARY STENT PLACEMENT;  Surgeon: Irving Copas., MD;  Location: [facility];  Service: Gastroenterology;  Laterality: N/A;  . COLONOSCOPY  07/24/2011   Procedure: COLONOSCOPY;  Surgeon: Landry Dyke, MD;  Location: [facility];  Service: Endoscopy;  Laterality: N/A;  . Dental Surgery Tooth Extraction    . ERCP N/A 09/22/2019   Procedure: ENDOSCOPIC RETROGRADE  CHOLANGIOPANCREATOGRAPHY (ERCP);  Surgeon: Irving Copas., MD;  Location: [facility];  Service: Gastroenterology;  Laterality: N/A;  . ESOPHAGOGASTRODUODENOSCOPY (EGD) WITH PROPOFOL N/A 09/22/2019   Procedure: ESOPHAGOGASTRODUODENOSCOPY (EGD) WITH PROPOFOL;  Surgeon: Rush Landmark  Telford Nab., MD;  Location: [facility];  Service: Gastroenterology;  Laterality: N/A;  . ESOPHAGOGASTRODUODENOSCOPY (EGD) WITH PROPOFOL N/A 09/25/2019   Procedure: ESOPHAGOGASTRODUODENOSCOPY (EGD) WITH PROPOFOL;  Surgeon: Rush Landmark Telford Nab., MD;  Location: [facility];  Service: Gastroenterology;  Laterality: N/A;  . EUS N/A 09/22/2019   Procedure: UPPER ENDOSCOPIC ULTRASOUND (EUS) RADIAL;  Surgeon: Rush Landmark Telford Nab., MD;  Location: [facility];  Service: Gastroenterology;  Laterality: N/A;  . FINE NEEDLE ASPIRATION  09/22/2019   Procedure: FINE NEEDLE ASPIRATION (FNA) LINEAR;  Surgeon: Irving Copas., MD;  Location: [facility];  Service: Gastroenterology;;  . FINE NEEDLE ASPIRATION  09/25/2019   Procedure: FINE NEEDLE ASPIRATION (FNA) RADIAL;  Surgeon: Irving Copas., MD;  Location: [facility];  Service: Gastroenterology;;  . HEMOSTASIS CLIP PLACEMENT  09/25/2019   Procedure: HEMOSTASIS CLIP PLACEMENT;  Surgeon: Irving Copas., MD;  Location: [facility];  Service: Gastroenterology;;  . IR IMAGING GUIDED PORT INSERTION  09/24/2019  . SPHINCTEROTOMY  09/22/2019   Procedure: SPHINCTEROTOMY;  Surgeon: Mansouraty, Telford Nab., MD;  Location: [facility];  Service: Gastroenterology;;  . UPPER ESOPHAGEAL ENDOSCOPIC ULTRASOUND (EUS) N/A 09/25/2019   Procedure: UPPER ESOPHAGEAL ENDOSCOPIC ULTRASOUND (EUS);  Surgeon: Irving Copas., MD;  Location: [facility];  Service: Gastroenterology;  Laterality: N/A;     FAMILY HISTORY:  Family History  Problem Relation Age of Onset  . Diabetes Sister   . Cancer Maternal Grandmother        leukemia   . Diabetes Maternal Grandfather   . Diabetes Paternal Grandfather   . Diabetes Paternal Grandmother   . Hypertension Sister   . Hypertension Brother   . Anesthesia problems Neg Hx      SOCIAL HISTORY:  reports that she quit smoking about 5 months ago. Her smoking use included cigarettes. She has never used  smokeless tobacco. She reports previous alcohol use of about 30.0 standard drinks of alcohol per week. She reports that she does not use drugs. The patient is married and lives in [place]. She has been working for [employer] for more than 20 years in [employer]. She was most recently at [employer] and hopes to get back after her treatments.    ALLERGIES: Lisinopril   MEDICATIONS:  Current Outpatient Medications  Medication Sig Dispense Refill  . allopurinol (ZYLOPRIM) 100 MG tablet Take 1 tablet (100 mg total) by mouth 3 (three) times daily after meals. 90 tablet 5  . amLODipine (NORVASC) 10 MG tablet Take 1 tablet (10 mg total) by mouth at bedtime. 90 tablet 3  . atorvastatin (LIPITOR) 20 MG tablet Take 20 mg by mouth daily.    . cholecalciferol (VITAMIN D) 400 UNITS TABS tablet Take 400 Units by mouth daily.    Marland Kitchen dexamethasone (DECADRON) 4 MG tablet Take 2 tablets (8 mg total) by mouth daily. Start the day after chemotherapy for 3 days. Take with food. 8 tablet 5  . glimepiride (AMARYL) 2 MG tablet Take 1 tablet (2 mg total) by mouth daily with breakfast. 30 tablet 6  . glucose blood test strip Test blood glucose once daily. One Touch Verio test strips 100 each 6  . Lancet Device MISC Check blood glucose once daily. One TCH 32I Delicapl LNC 712 each 6  . Lecithin 1200 MG  CAPS Take by mouth.    . lidocaine-prilocaine (EMLA) cream Apply 1 application topically as needed. 30 g 0  . lipase/protease/amylase (CREON) 36000 UNITS CPEP capsule Take 2 capsules (72,000 Units total) by mouth 3 (three) times daily before meals. (Patient taking differently: Take 36,000 Units by mouth daily as needed. ) 180 capsule 6  . loperamide (IMODIUM A-D) 2 MG tablet Take 2 at onset of diarrhea, then 1 every 2hrs until 12hr without a BM. May take 2 tab every 4hrs at bedtime. If diarrhea recurs repeat. 100 tablet 1  . LORazepam (ATIVAN) 0.5 MG tablet Take 1 tablet (0.5 mg total) by mouth  every 6 (six) hours as needed for anxiety. 30 tablet 0  . metFORMIN (GLUCOPHAGE) 500 MG tablet Take 1 tablet (500 mg total) by mouth 2 (two) times daily with a meal. 180 tablet 1  . metoprolol succinate (TOPROL-XL) 25 MG 24 hr tablet Take 1 tablet (25 mg total) by mouth daily. Take with or immediately following a meal. 90 tablet 3  . olmesartan (BENICAR) 5 MG tablet Take 1 tablet (5 mg total) by mouth daily. 90 tablet 1  . ondansetron (ZOFRAN) 8 MG tablet Take 1 tablet (8 mg total) by mouth 2 (two) times daily as needed. Start on day 3 after chemotherapy. 30 tablet 1  . pantoprazole (PROTONIX) 40 MG tablet Take 1 tablet (40 mg total) by mouth 2 (two) times daily. 108 tablet 0  . prochlorperazine (COMPAZINE) 10 MG tablet Take 1 tablet (10 mg total) by mouth every 6 (six) hours as needed (Nausea or vomiting). (Patient not taking: Reported on 11/24/2019) 30 tablet 1  . TURMERIC CURCUMIN PO Take by mouth 2 (two) times daily.     No current facility-administered medications for this encounter.     REVIEW OF SYSTEMS: On review of systems, the patient reports that she is doing well overall. She reports she's eating well and not having any pain. No other complaints are noted.      PHYSICAL EXAM:  Wt Readings from Last 3 Encounters:  12/16/19 204 lb (92.5 kg)  11/24/19 203 lb 12.8 oz (92.4 kg)  11/10/19 195 lb (88.5 kg)   Unable to assess due to encounter type.   ECOG = 0  0 - Asymptomatic (Fully active, able to carry on all predisease activities without restriction)  1 - Symptomatic but completely ambulatory (Restricted in physically strenuous activity but ambulatory and able to carry out work of a light or sedentary nature. For example, light housework, office work)  2 - Symptomatic, <50% in bed during the day (Ambulatory and capable of all self care but unable to carry out any work activities. Up and about more than 50% of waking hours)  3 - Symptomatic, >50% in bed, but not bedbound  (Capable of only limited self-care, confined to bed or chair 50% or more of waking hours)  4 - Bedbound (Completely disabled. Cannot carry on any self-care. Totally confined to bed or chair)  5 - Death   Eustace Pen MM, Creech RH, Tormey DC, et al. [phone]). "Toxicity and response criteria of the [facility]". [place] Oncol. 5 (6): 649-55    LABORATORY DATA:  Lab Results  Component Value Date   WBC 10.2 12/16/2019   HGB 11.9 (L) 12/16/2019   HCT 37.0 12/16/2019   MCV 85.6 12/16/2019   PLT 322 12/16/2019   Lab Results  Component Value Date   NA 138 12/16/2019   K 3.4 (L) 12/16/2019  CL 101 12/16/2019   CO2 28 12/16/2019   Lab Results  Component Value Date   ALT 25 12/16/2019   AST 26 12/16/2019   ALKPHOS 53 12/16/2019   BILITOT 0.4 12/16/2019      RADIOGRAPHY: CT Chest W Contrast  Result Date: 12/16/2019 CLINICAL DATA:  Pancreatic cancer, assess treatment response EXAM: CT CHEST, ABDOMEN, AND PELVIS WITH CONTRAST TECHNIQUE: Multidetector CT imaging of the chest, abdomen and pelvis was performed following the standard protocol during bolus administration of intravenous contrast. CONTRAST:  173m OMNIPAQUE IOHEXOL 300 MG/ML SOLN, additional oral enteric contrast COMPARISON:  CT chest, 09/23/2019, MR abdomen, 09/20/2019 FINDINGS: CT CHEST FINDINGS Cardiovascular: Aortic atherosclerosis. Aortic valve calcifications. Normal heart size. No pericardial effusion. Mediastinum/Nodes: Unchanged prominent bilateral axillary lymph nodes. Unchanged 0.9 cm epicardial lymph node (series 2, image 35). Thyroid gland, trachea, and esophagus demonstrate no significant findings. Lungs/Pleura: Lungs are clear. No pleural effusion or pneumothorax. Musculoskeletal: No chest wall mass or suspicious bone lesions identified. CT ABDOMEN PELVIS FINDINGS Hepatobiliary: No solid liver abnormality is seen. No gallstones or gallbladder wall thickening. Common bile duct stent with post stenting  pneumobilia. Pancreas: Redemonstrated, ill-defined calcified hypodense mass of the pancreatic head measuring approximately 3.0 x 2.9 cm, not significantly changed. There is pancreatic ductal dilatation up to 1.4 cm. This appears to abut but does not encase the most central portion of the superior mesenteric and portal veins (series 2, image 54). The celiac axis and superior mesenteric artery are not involved. Spleen: Normal in size without significant abnormality. Adrenals/Urinary Tract: Adrenal glands are unremarkable. Redemonstrated, contrast enhancing mixed solid and cystic lesion of the inferior pole of the right kidney measuring 1.2 cm. Bladder is unremarkable. Stomach/Bowel: Stomach is within normal limits. Appendix appears normal. No evidence of bowel wall thickening, distention, or inflammatory changes. Vascular/Lymphatic: Aortic atherosclerosis. Prominent subcentimeter celiac axis, portacaval, and retroperitoneal lymph nodes are unchanged. Reproductive: No mass or other abnormality. Other: No abdominal wall hernia or abnormality. No abdominopelvic ascites. Musculoskeletal: No acute or significant osseous findings. IMPRESSION: 1. Redemonstrated, ill-defined calcified hypodense mass of the pancreatic head measuring approximately 3.0 x 2.9 cm, not significantly changed. This mass appears to abut but does not encase the most central portion of the superior mesenteric and portal veins. The celiac axis and superior mesenteric artery are not involved. 2. No definite evidence of metastatic disease in the chest, abdomen, or pelvis. 3. Prominent, nonspecific epicardial, celiac axis, portacaval, and retroperitoneal lymph nodes, unchanged. Attention on follow-up. 4. Status post common bile duct stenting with post stenting pneumobilia. 5. 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  6.  Aortic Atherosclerosis (ICD10-I70.0). Electronically Signed   By: AEddie CandleM.D.   On: 12/16/2019 10:03   CT Abdomen Pelvis W Contrast  Result Date: 12/16/2019 CLINICAL DATA:  Pancreatic cancer, assess treatment response EXAM: CT CHEST, ABDOMEN, AND PELVIS WITH CONTRAST TECHNIQUE: Multidetector CT imaging of the chest, abdomen and pelvis was performed following the standard protocol during bolus administration of intravenous contrast. CONTRAST:  1052mOMNIPAQUE IOHEXOL 300 MG/ML SOLN, additional oral enteric contrast COMPARISON:  CT chest, 09/23/2019, MR abdomen, 09/20/2019 FINDINGS: CT CHEST FINDINGS Cardiovascular: Aortic atherosclerosis. Aortic valve calcifications. Normal heart size. No pericardial effusion. Mediastinum/Nodes: Unchanged prominent bilateral axillary lymph nodes. Unchanged 0.9 cm epicardial lymph node (series 2, image 35). Thyroid gland, trachea, and esophagus demonstrate no significant findings. Lungs/Pleura: Lungs are clear.  No pleural effusion or pneumothorax. Musculoskeletal: No chest wall mass or suspicious bone lesions identified. CT ABDOMEN PELVIS FINDINGS Hepatobiliary: No solid liver abnormality is seen. No gallstones or gallbladder wall thickening. Common bile duct stent with post stenting pneumobilia. Pancreas: Redemonstrated, ill-defined calcified hypodense mass of the pancreatic head measuring approximately 3.0 x 2.9 cm, not significantly changed. There is pancreatic ductal dilatation up to 1.4 cm. This appears to abut but does not encase the most central portion of the superior mesenteric and portal veins (series 2, image 54). The celiac axis and superior mesenteric artery are not involved. Spleen: Normal in size without significant abnormality. Adrenals/Urinary Tract: Adrenal glands are unremarkable. Redemonstrated, contrast enhancing mixed solid and cystic lesion of the inferior pole of the right kidney measuring 1.2 cm. Bladder is unremarkable. Stomach/Bowel: Stomach is  within normal limits. Appendix appears normal. No evidence of bowel wall thickening, distention, or inflammatory changes. Vascular/Lymphatic: Aortic atherosclerosis. Prominent subcentimeter celiac axis, portacaval, and retroperitoneal lymph nodes are unchanged. Reproductive: No mass or other abnormality. Other: No abdominal wall hernia or abnormality. No abdominopelvic ascites. Musculoskeletal: No acute or significant osseous findings. IMPRESSION: 1. Redemonstrated, ill-defined calcified hypodense mass of the pancreatic head measuring approximately 3.0 x 2.9 cm, not significantly changed. This mass appears to abut but does not encase the most central portion of the superior mesenteric and portal veins. The celiac axis and superior mesenteric artery are not involved. 2. No definite evidence of metastatic disease in the chest, abdomen, or pelvis. 3. Prominent, nonspecific epicardial, celiac axis, portacaval, and retroperitoneal lymph nodes, unchanged. Attention on follow-up. 4. Status post common bile duct stenting with post stenting pneumobilia. 5. 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 6.  Aortic Atherosclerosis (ICD10-I70.0). Electronically Signed   By: Eddie Candle M.D.   On: 12/16/2019 10:03       IMPRESSION/PLAN: 1. Clinical stage II-III adenocarcinoma of the head of the pancreas. Dr. Lisbeth Renshaw discusses the pathology findings and reviews the nature of pancreatic cancer and discusses the rationale for surgical resection, for those were not candidates or who declined, and alternative could be considered with radiotherapy.  Dr. Lisbeth Renshaw discusses the rationale for stereotactic body radiotherapy, but does discuss that while it would be a definitive treatment plan, that she still has a high risk of recurrence.  We discussed the risks, benefits, short, and long term effects of  radiotherapy, and the patient is interested in proceeding. Dr. Lisbeth Renshaw discusses the delivery and logistics of radiotherapy and anticipates a course of 5 fractions of radiotherapy.  We discussed the rationale prior to proceeding for having fiducial markers placed.  We will reach out to Dr. Rush Landmark who performed her EUS to see if he would be able to place these prior to proceeding.  Once we know when this can occur, we will coordinate simulation to follow and subsequent treatment.  She would sign consent to proceed when she comes in for simulation.  Given current concerns for patient exposure during the COVID-19 pandemic, this encounter was conducted via telephone.  The patient has provided two factor identification and has given verbal consent for this type of encounter and has been advised to only accept a meeting of this type in a secure network environment. The time spent during this encounter was 45 minutes including preparation, discussion, and coordination of the patient's care. The attendants for this meeting include Blenda Nicely, RN,  Dr. Lisbeth Renshaw, Hayden Pedro  and Christiana Fuchs.  During the encounter,  Blenda Nicely, RN, Dr. Lisbeth Renshaw, and Hayden Pedro were located at [facility] Radiation Oncology Department.  Nancyann L Bergsma was located at home.   The above documentation reflects my direct findings during this shared patient visit. Please see the separate note by Dr. Lisbeth Renshaw on this date for the remainder of the patient's plan of care.    Carola Rhine, PAC

## 2020-01-14 ENCOUNTER — Other Ambulatory Visit: Payer: Self-pay

## 2020-01-14 ENCOUNTER — Telehealth: Payer: Self-pay

## 2020-01-14 ENCOUNTER — Encounter: Payer: Self-pay | Admitting: General Practice

## 2020-01-14 DIAGNOSIS — C250 Malignant neoplasm of head of pancreas: Secondary | ICD-10-CM

## 2020-01-14 NOTE — Telephone Encounter (Signed)
The pt has been scheduled for 01/19/20 at 1130 am at [facility] with Dr Rush Landmark .  COVID test on 01/15/20 at 945 am.  Spoke with the pt and instructed her on procedure and COVID testing. The pt has been advised of the information and verbalized understanding.   I will also send all information to her My Chart acct.

## 2020-01-14 NOTE — Progress Notes (Signed)
[place] Initial Psychosocial Assessment and Distress Screen Review Clinical Social Work  Clinical Social Work contacted by phone to assess psychosocial, emotional, mental health, and spiritual needs of the patient.   Barriers to care/review of distress screen:  - Transportation:  Do you anticipate any problems getting to appointments?  Do you have someone who can help run errands for you if you need it?  No problems w transportation.   - Help at home:  What is your living situation (alone, family, other)?  If you are physically unable to care for yourself, who would you call on to help you?  Lives at home w husband, he can help as needed.   - Support system:  What does your support system look like?  Who would you call on if you needed some kind of practical help?  What if you needed someone to talk to for emotional support?  Nieces, nephews, children and spouse - Finances:  Are you concerned about finances.  Considering returning to work?  If not, applying for disability?  Has not worked since February.  Works in [employer].  Physically demanding job.  Plans to return when she is through surgery and radiation treatment, confident employer will hold her job.  Does not need any additional information/support from [employer].  Denies financial stress.  Was unsure why she checked box indicating difficulties at work/school.  ONCBCN DISTRESS SCREENING 01/13/2020  Distress experienced in past week (1-10) 7  Practical problem type Work/school    What is your understanding of where you are with your cancer? Its cause?  Your treatment plan and what happens next? Diagnosed with Stage 2 - 3 pancreatic cancer, about to undergo radiation therapy.  Reports that she has all the support she needs, stating "this is not my first time."  Feels she is very familiar w the course of treatment and has good support in the community.    What are your worries for the future as you begin treatment for cancer?  No  concerns at all.    CSW Summary:  Patient and family psychosocial functioning including strengths, limitations, and coping skills:  64 year old married female diagnosed w pancreatic cancer, treatment plan includes radiation, surgery.  States she has no needs, good support, no concerns.    Identifications of barriers to care:  None noted.   Availability of community resources:  None needed.    Clinical Social Worker follow up needed: No.   Edwyna Shell, [place] Social Worker Phone:  [phone] Cell:  [phone]

## 2020-01-14 NOTE — Telephone Encounter (Signed)
-----   Message from Irving Copas., MD sent at 01/13/2020  5:49 PM EDT ----- Regarding: Some thoughts on a date for possible fiducial placement Torian Thoennes,On June 3 I am in the [facility] however it looks like I have had some patients fall off the list.Maybe we could try to move up the 11 and 1130 case to the open slots earlier in the morning.If that is possible then hopefully [place] [facility] could help [place] with trying to get a slot around lunchtime for me and I can do it between 12 and 130 before clinic.Can you find out if a time slot would even be available before we start switching case times? The only other day I can think of would be June 2 if they would let me squeeze in an EUS because we are not taking samples we are just putting fiducials and my ERCP case that is scheduled should not be a long case.Let me know what you find out and then we can hopefully update the radiation oncology team.Thanks.GM

## 2020-01-14 NOTE — Telephone Encounter (Signed)
FYI:  Dr Mansouraty  

## 2020-01-15 ENCOUNTER — Other Ambulatory Visit (HOSPITAL_COMMUNITY)
Admission: RE | Admit: 2020-01-15 | Discharge: 2020-01-15 | Disposition: A | Discharge status: Home / Self Care | Payer: BC Managed Care – PPO | Source: Ambulatory Visit | Attending: Gastroenterology | Admitting: Gastroenterology

## 2020-01-15 DIAGNOSIS — Z01812 Encounter for preprocedural laboratory examination: Secondary | ICD-10-CM | POA: Insufficient documentation

## 2020-01-15 DIAGNOSIS — Z20822 Contact with and (suspected) exposure to covid-19: Secondary | ICD-10-CM | POA: Insufficient documentation

## 2020-01-15 LAB — SARS CORONAVIRUS 2 (TAT 6-24 HRS): SARS Coronavirus 2: NEGATIVE

## 2020-01-18 NOTE — Telephone Encounter (Signed)
Thank you. GM 

## 2020-01-19 ENCOUNTER — Encounter (HOSPITAL_COMMUNITY)
Admission: RE | Disposition: A | Discharge status: Home / Self Care | Payer: Self-pay | Source: Ambulatory Visit | Attending: Gastroenterology

## 2020-01-19 ENCOUNTER — Ambulatory Visit (HOSPITAL_COMMUNITY): Payer: BC Managed Care – PPO | Admitting: Certified Registered"

## 2020-01-19 ENCOUNTER — Ambulatory Visit (HOSPITAL_COMMUNITY)
Admission: RE | Admit: 2020-01-19 | Discharge: 2020-01-19 | Disposition: A | Discharge status: Home / Self Care | Payer: BC Managed Care – PPO | Source: Ambulatory Visit | Attending: Gastroenterology | Admitting: Gastroenterology

## 2020-01-19 ENCOUNTER — Encounter (HOSPITAL_COMMUNITY): Payer: Self-pay | Admitting: Gastroenterology

## 2020-01-19 ENCOUNTER — Other Ambulatory Visit: Payer: Self-pay | Admitting: Family

## 2020-01-19 ENCOUNTER — Other Ambulatory Visit: Payer: Self-pay

## 2020-01-19 ENCOUNTER — Encounter (HOSPITAL_COMMUNITY): Payer: Self-pay

## 2020-01-19 DIAGNOSIS — Z7984 Long term (current) use of oral hypoglycemic drugs: Secondary | ICD-10-CM | POA: Insufficient documentation

## 2020-01-19 DIAGNOSIS — Z87891 Personal history of nicotine dependence: Secondary | ICD-10-CM | POA: Diagnosis not present

## 2020-01-19 DIAGNOSIS — C250 Malignant neoplasm of head of pancreas: Secondary | ICD-10-CM | POA: Diagnosis not present

## 2020-01-19 DIAGNOSIS — K7460 Unspecified cirrhosis of liver: Secondary | ICD-10-CM | POA: Diagnosis not present

## 2020-01-19 DIAGNOSIS — Z6837 Body mass index (BMI) 37.0-37.9, adult: Secondary | ICD-10-CM | POA: Insufficient documentation

## 2020-01-19 DIAGNOSIS — E669 Obesity, unspecified: Secondary | ICD-10-CM | POA: Insufficient documentation

## 2020-01-19 DIAGNOSIS — K2980 Duodenitis without bleeding: Secondary | ICD-10-CM | POA: Insufficient documentation

## 2020-01-19 DIAGNOSIS — I10 Essential (primary) hypertension: Secondary | ICD-10-CM | POA: Insufficient documentation

## 2020-01-19 DIAGNOSIS — E119 Type 2 diabetes mellitus without complications: Secondary | ICD-10-CM | POA: Insufficient documentation

## 2020-01-19 HISTORY — PX: ESOPHAGOGASTRODUODENOSCOPY (EGD) WITH PROPOFOL: SHX5813

## 2020-01-19 HISTORY — PX: EUS: SHX5427

## 2020-01-19 HISTORY — PX: FIDUCIAL MARKER PLACEMENT: SHX6858

## 2020-01-19 LAB — GLUCOSE, CAPILLARY: Glucose-Capillary: 105 mg/dL — ABNORMAL HIGH (ref 70–99)

## 2020-01-19 SURGERY — UPPER ESOPHAGEAL ENDOSCOPIC ULTRASOUND (EUS)
Anesthesia: Monitor Anesthesia Care

## 2020-01-19 SURGERY — UPPER ENDOSCOPIC ULTRASOUND (EUS) RADIAL
Anesthesia: Monitor Anesthesia Care

## 2020-01-19 MED ORDER — LACTATED RINGERS IV SOLN: INTRAVENOUS | Status: DC | PRN

## 2020-01-19 MED ORDER — LIDOCAINE 2% (20 MG/ML) 5 ML SYRINGE
INTRAMUSCULAR | Status: DC | PRN
  Administered 2020-01-19: 40 mg via INTRAVENOUS

## 2020-01-19 MED ORDER — SODIUM CHLORIDE 0.9 % IV SOLN: INTRAVENOUS | Status: DC

## 2020-01-19 MED ORDER — PROPOFOL 10 MG/ML IV BOLUS
INTRAVENOUS | Status: DC | PRN
  Administered 2020-01-19: 20 mg via INTRAVENOUS
  Administered 2020-01-19: 50 mg via INTRAVENOUS

## 2020-01-19 MED ORDER — PROPOFOL 500 MG/50ML IV EMUL
INTRAVENOUS | Status: DC | PRN
  Administered 2020-01-19: 100 ug/kg/min via INTRAVENOUS

## 2020-01-19 SURGICAL SUPPLY — 2 items
Fiducial marker ×4 IMPLANT
fiducial marker ×2 IMPLANT

## 2020-01-19 NOTE — Transfer of Care (Signed)
Immediate Anesthesia Transfer of Care Note  Patient: Melanie Alvarez  Procedure(s) Performed: UPPER ENDOSCOPIC ULTRASOUND (EUS) RADIAL (N/A ) FIDUCIAL MARKER PLACEMENT (N/A )  Patient Location: Endoscopy Unit  Anesthesia Type:MAC  Level of Consciousness: awake, alert  and oriented  Airway & Oxygen Therapy: Patient Spontanous Breathing  Post-op Assessment: Report given to RN  Post vital signs: Reviewed and stable  Last Vitals:  Vitals Value Taken Time  BP 112/67 01/19/20 1214  Temp    Pulse 85 01/19/20 1215  Resp 23 01/19/20 1215  SpO2 97 % 01/19/20 1215  Vitals shown include unvalidated device data.  Last Pain:  Vitals:   01/19/20 1108  TempSrc: Oral  PainSc: 0-No pain         Complications: No apparent anesthesia complications

## 2020-01-19 NOTE — H&P (Signed)
GASTROENTEROLOGY PROCEDURE H&P NOTE   Primary Care Physician: Flossie Buffy, NP  HPI: Melanie Alvarez is a 64 y.o. female who presents for EGD/EUS with fiducial marker placement in effort of getting her to SBRT.  Past Medical History:  Diagnosis Date  . Cirrhosis ([place])   . Diabetes mellitus 2012  . Diverticulosis   . Gallbladder sludge   . Hepatitis 2010   history of Hepatitis C  . Hepatitis C   . Hypertension 2011  . Obesity    Past Surgical History:  Procedure Laterality Date  . BILIARY BRUSHING  09/22/2019   Procedure: BILIARY BRUSHING;  Surgeon: Rush Landmark Telford Nab., MD;  Location: [facility];  Service: Gastroenterology;;  . BILIARY DILATION  09/22/2019   Procedure: BILIARY DILATION;  Surgeon: Irving Copas., MD;  Location: [facility];  Service: Gastroenterology;;  . BILIARY STENT PLACEMENT N/A 09/22/2019   Procedure: BILIARY STENT PLACEMENT;  Surgeon: Irving Copas., MD;  Location: [facility];  Service: Gastroenterology;  Laterality: N/A;  . COLONOSCOPY  07/24/2011   Procedure: COLONOSCOPY;  Surgeon: Landry Dyke, MD;  Location: [facility];  Service: Endoscopy;  Laterality: N/A;  . Dental Surgery Tooth Extraction    . ERCP N/A 09/22/2019   Procedure: ENDOSCOPIC RETROGRADE CHOLANGIOPANCREATOGRAPHY (ERCP);  Surgeon: Irving Copas., MD;  Location: [facility];  Service: Gastroenterology;  Laterality: N/A;  . ESOPHAGOGASTRODUODENOSCOPY (EGD) WITH PROPOFOL N/A 09/22/2019   Procedure: ESOPHAGOGASTRODUODENOSCOPY (EGD) WITH PROPOFOL;  Surgeon: Rush Landmark Telford Nab., MD;  Location: [facility];  Service: Gastroenterology;  Laterality: N/A;  . ESOPHAGOGASTRODUODENOSCOPY (EGD) WITH PROPOFOL N/A 09/25/2019   Procedure: ESOPHAGOGASTRODUODENOSCOPY (EGD) WITH PROPOFOL;  Surgeon: Rush Landmark Telford Nab., MD;  Location: [facility];  Service: Gastroenterology;  Laterality: N/A;  . EUS N/A 09/22/2019   Procedure: UPPER ENDOSCOPIC ULTRASOUND (EUS)  RADIAL;  Surgeon: Rush Landmark Telford Nab., MD;  Location: [facility];  Service: Gastroenterology;  Laterality: N/A;  . FINE NEEDLE ASPIRATION  09/22/2019   Procedure: FINE NEEDLE ASPIRATION (FNA) LINEAR;  Surgeon: Irving Copas., MD;  Location: [facility];  Service: Gastroenterology;;  . FINE NEEDLE ASPIRATION  09/25/2019   Procedure: FINE NEEDLE ASPIRATION (FNA) RADIAL;  Surgeon: Irving Copas., MD;  Location: [facility];  Service: Gastroenterology;;  . HEMOSTASIS CLIP PLACEMENT  09/25/2019   Procedure: HEMOSTASIS CLIP PLACEMENT;  Surgeon: Irving Copas., MD;  Location: [facility];  Service: Gastroenterology;;  . IR IMAGING GUIDED PORT INSERTION  09/24/2019  . SPHINCTEROTOMY  09/22/2019   Procedure: SPHINCTEROTOMY;  Surgeon: Mansouraty, Telford Nab., MD;  Location: [facility];  Service: Gastroenterology;;  . UPPER ESOPHAGEAL ENDOSCOPIC ULTRASOUND (EUS) N/A 09/25/2019   Procedure: UPPER ESOPHAGEAL ENDOSCOPIC ULTRASOUND (EUS);  Surgeon: Irving Copas., MD;  Location: [facility];  Service: Gastroenterology;  Laterality: N/A;   Current Facility-Administered Medications  Medication Dose Route Frequency Provider Last Rate Last Admin  . 0.9 %  sodium chloride infusion   Intravenous Continuous Mansouraty, Telford Nab., MD       Allergies  Allergen Reactions  . Lisinopril Swelling    Facial and upper lip   Family History  Problem Relation Age of Onset  . Diabetes Sister   . Cancer Maternal Grandmother        leukemia   . Diabetes Maternal Grandfather   . Diabetes Paternal Grandfather   . Diabetes Paternal Grandmother   . Hypertension Sister   . Hypertension Brother   . Anesthesia problems Neg Hx    Social History   Socioeconomic History  . Marital  status: Married    Spouse name: Not on file  . Number of children: Not on file  . Years of education: Not on file  . Highest education level: Not on file  Occupational History  . Not on file  Tobacco Use   . Smoking status: Former Smoker    Types: Cigarettes    Quit date: 09/20/2019    Years since quitting: 0.3  . Smokeless tobacco: Never Used  Substance and Sexual Activity  . Alcohol use: Not Currently    Alcohol/week: 30.0 standard drinks    Types: 24 Cans of beer, 6 Shots of liquor per week    Comment: weekends  . Drug use: No    Comment: previously   . Sexual activity: Yes    Partners: Male    Birth control/protection: Post-menopausal  Other Topics Concern  . Not on file  Social History Narrative   Previous [facility] pt.  Last MD was Dr. Delman Cheadle, seen 12/2011      Works part time in [employer] with husband   Social Determinants of Health   Financial Resource Strain:   . Difficulty of Paying Living Expenses:   Food Insecurity:   . Worried About Charity fundraiser in the Last Year:   . Arboriculturist in the Last Year:   Transportation Needs:   . Film/video editor (Medical):   Marland Kitchen Lack of Transportation (Non-Medical):   Physical Activity:   . Days of Exercise per Week:   . Minutes of Exercise per Session:   Stress:   . Feeling of Stress :   Social Connections:   . Frequency of Communication with Friends and Family:   . Frequency of Social Gatherings with Friends and Family:   . Attends Religious Services:   . Active Member of Clubs or Organizations:   . Attends Archivist Meetings:   Marland Kitchen Marital Status:   Intimate Partner Violence:   . Fear of Current or Ex-Partner:   . Emotionally Abused:   Marland Kitchen Physically Abused:   . Sexually Abused:     Physical Exam: Vital signs in last 24 hours:     GEN: NAD EYE: Sclerae anicteric ENT: MMM CV: Non-tachycardic GI: Soft, NT/ND NEURO:  Alert & Oriented x 3  Lab Results: No results for input(s): WBC, HGB, HCT, PLT in the last 72 hours. BMET No results for input(s): NA, K, CL, CO2, GLUCOSE, BUN, CREATININE, CALCIUM in the last 72 hours. LFT No results for input(s): PROT,  ALBUMIN, AST, ALT, ALKPHOS, BILITOT, BILIDIR, IBILI in the last 72 hours. PT/INR No results for input(s): LABPROT, INR in the last 72 hours.   Impression / Plan: This is a 64 y.o.female who presents for EGD/EUS with fiducial marker placement in effort of getting her to SBRT.  The risks of EUS including bleeding, infection, aspiration pneumonia and intestinal perforation were discussed as was the possibility it may not give a definitive diagnosis.  Although I do not need to take a biopsy with today's procedure, a fiducial marker placement still has an additional risk of pancreatitis at the rate of about 1-2%.  It was explained that procedure related pancreatitis is typically mild, although can be severe and even life threatening, which is why we do not perform random pancreatic biopsies and only biopsy a lesion we feel is concerning enough to warrant the risk.  The risks and benefits of endoscopic evaluation were discussed with the  patient; these include but are not limited to the risk of perforation, infection, bleeding, missed lesions, lack of diagnosis, severe illness requiring hospitalization, as well as anesthesia and sedation related illnesses.  The patient is agreeable to proceed.    Justice Britain, MD [facility] # [phone]

## 2020-01-19 NOTE — Anesthesia Preprocedure Evaluation (Signed)
Anesthesia Evaluation  Patient identified by MRN, date of birth, ID band Patient awake    Reviewed: Allergy & Precautions, NPO status , Patient's Chart, lab work & pertinent test results, reviewed documented beta blocker date and time   History of Anesthesia Complications Negative for: history of anesthetic complications  Airway Mallampati: II  TM Distance: >3 FB Neck ROM: Full    Dental  (+) Missing, Poor Dentition,    Pulmonary Patient abstained from smoking., former smoker,    Pulmonary exam normal        Cardiovascular hypertension, Pt. on home beta blockers Normal cardiovascular exam     Neuro/Psych negative neurological ROS  negative psych ROS   GI/Hepatic (+) Cirrhosis       , Hepatitis -, C  Endo/Other  diabetes, Type 2, Oral Hypoglycemic Agents  Renal/GU negative Renal ROS  negative genitourinary   Musculoskeletal   Abdominal (+) + obese,   Peds  Hematology  (+) anemia , Hgb 10.5   Anesthesia Other Findings Day of surgery medications reviewed with patient.  Reproductive/Obstetrics negative OB ROS                             Anesthesia Physical  Anesthesia Plan  ASA: II  Anesthesia Plan: MAC   Post-op Pain Management:    Induction:   PONV Risk Score and Plan: 2 and Propofol infusion, Treatment may vary due to age or medical condition and Ondansetron  Airway Management Planned: Natural Airway and Nasal Cannula  Additional Equipment: None  Intra-op Plan:   Post-operative Plan:   Informed Consent: I have reviewed the patients History and Physical, chart, labs and discussed the procedure including the risks, benefits and alternatives for the proposed anesthesia with the patient or authorized representative who has indicated his/her understanding and acceptance.       Plan Discussed with: CRNA  Anesthesia Plan Comments:         Anesthesia Quick  Evaluation

## 2020-01-19 NOTE — Anesthesia Postprocedure Evaluation (Signed)
Anesthesia Post Note  Patient: Christiana Fuchs  Procedure(s) Performed: UPPER ENDOSCOPIC ULTRASOUND (EUS) RADIAL (N/A ) FIDUCIAL MARKER PLACEMENT (N/A )     Patient location during evaluation: Phase II Anesthesia Type: MAC Level of consciousness: awake Pain management: pain level controlled Vital Signs Assessment: post-procedure vital signs reviewed and stable Respiratory status: spontaneous breathing Cardiovascular status: stable Postop Assessment: no apparent nausea or vomiting Anesthetic complications: no    Last Vitals:  Vitals:   01/19/20 1224 01/19/20 1234  BP: 125/81 (!) 150/89  Pulse: 83 81  Resp: 19 (!) 22  Temp:    SpO2: 97% 98%    Last Pain:  Vitals:   01/19/20 1234  TempSrc:   PainSc: 2    Pain Goal:                   Huston Foley

## 2020-01-19 NOTE — Op Note (Signed)
[facility] Patient Name: Melanie Alvarez Procedure Date : 01/19/2020 MRN: [id number] Attending MD: Justice Britain , MD Date of Birth: June 14, 1956 CSN: [id number] Age: 64 Admit Type: Outpatient Procedure:                Upper EUS Indications:              Pancreatic adenocarcinoma, Fiducial Marker                            Placement for SBRT Providers:                Justice Britain, MD, Baird Cancer, RN, Laverda Sorenson, Technician, Sampson Si, CRNA Referring MD:             Jodelle Gross, Stark Klein MD, MD, Rudell Cobb.                            Ennever MD, MD Medicines:                Monitored Anesthesia Care Complications:            No immediate complications. Estimated Blood Loss:     Estimated blood loss was minimal. Procedure:                Pre-Anesthesia Assessment:                           - Prior to the procedure, a History and Physical                            was performed, and patient medications and                            allergies were reviewed. The patient's tolerance of                            previous anesthesia was also reviewed. The risks                            and benefits of the procedure and the sedation                            options and risks were discussed with the patient.                            All questions were answered, and informed consent                            was obtained. Prior Anticoagulants: The patient has                            taken no previous anticoagulant or antiplatelet  agents. ASA Grade Assessment: III - A patient with                            severe systemic disease. After reviewing the risks                            and benefits, the patient was deemed in                            satisfactory condition to undergo the procedure.                           After obtaining informed consent, the endoscope was       passed under direct vision. Throughout the                            procedure, the patient's blood pressure, pulse, and                            oxygen saturations were monitored continuously. The                            [id number]-H190 ([id number]) Olympus gastroscope was                            introduced through the mouth, and advanced to the                            second part of duodenum. The [id number]-[id number] ([id number])                            [place] was introduced through the                            mouth, and advanced to the second part of duodenum.                            The [id number] ([id number]) Olympus Linear EUS scope                            was introduced through the mouth, and advanced to                            the duodenum for ultrasound examination from the                            stomach and duodenum. The upper EUS was                            accomplished without difficulty. The patient                            tolerated the procedure. Scope In: Scope Out: Findings:  ENDOSCOPIC FINDING: :      No gross lesions were noted in the entire esophagus.      An endoclip was found in the cardia - previously placed.      Patchy mildly erythematous mucosa without bleeding was found in the       gastric body, at the incisura and in the gastric antrum.      A previously placed metal biliary stent was seen in the ampulla with       some sludge present.      Localized mild inflammation characterized by friability was found in the       second portion of the duodenum on the lateral edge of the duodenum where       the biliary stent is likely causing irritation.      No other gross lesions were noted in the duodenal bulb, in the first       portion of the duodenum and in the second portion of the duodenum.      ENDOSONOGRAPHIC FINDING: :      An irregular mass was identified in the pancreatic head. The mass was       hypoechoic. The mass measured  30 mm by 28 mm in maximal cross-sectional       diameter. The outer margins were irregular. Associated pancreatic ductal       dilation was noted. Fiducial marker placement was performed. Once the       target lesion in the Pancreatic head was identified preloaded markers in       a 22 gauge needle were then deployed in and around the lesion. This was       repeated for a total of four markers.      One stent was visualized endosonographically in the common bile duct       with some sludge in the biliary stent. Impression:               EGD Impression:                           - No gross lesions in esophagus.                           - An endoclip was found in the stomach.                           - Erythematous mucosa in the gastric body, incisura                            and antrum.                           - Metal biliary stent in the duodenum.                           - Duodenitis.                           EUS Impression:                           - A mass was identified in the pancreatic head. A  tissue diagnosis was obtained prior to this exam.                            This is adenocarcinoma.                           - Fiducial markers were deployed.                           - One biliary stent was visualized                            endosonographically in the common bile duct. Recommendation:           - The patient will be observed post-procedure,                            until all discharge criteria are met.                           - Discharge patient to home.                           - Patient has a contact number available for                            emergencies. The signs and symptoms of potential                            delayed complications were discussed with the                            patient. Return to normal activities tomorrow.                            Written discharge instructions were provided to the                             patient.                           - Observe patient's clinical course.                           - Monitor for signs/symptoms of bleeding,                            perforation, pancreatitis and infection. If issues                            please call our number to get further assistance as                            needed.                           -  Continue current medications.                           - The findings and recommendations were discussed                            with the patient.                           - The findings and recommendations were discussed                            with the patient's family. Procedure Code(s):        --- Professional ---                           [phone], Esophagogastroduodenoscopy, flexible,                            transoral; with transendoscopic ultrasound-guided                            transmural injection of diagnostic or therapeutic                            substance(s) (eg, anesthetic, neurolytic agent) or                            fiducial marker(s) (includes endoscopic ultrasound                            examination of the esophagus, stomach, and either                            the duodenum or a surgically altered stomach where                            the jejunum is examined distal to the anastomosis) Diagnosis Code(s):        --- Professional ---                           N02.7OZD, Foreign body in stomach, initial encounter                           K31.89, Other diseases of stomach and duodenum                           K29.80, Duodenitis without bleeding                           C25.0, Malignant neoplasm of head of pancreas                           C25.9, Malignant neoplasm of pancreas, unspecified CPT copyright 2019 American Medical Association. All rights reserved. The codes documented in this report are preliminary and upon coder review may  be revised to meet current compliance  requirements. Justice Britain, MD 01/19/2020 12:19:07 PM Number of Addenda: 0

## 2020-01-19 NOTE — Progress Notes (Signed)
Burna Mortimer, daughter, [phone]

## 2020-01-19 NOTE — Discharge Instructions (Signed)
YOU HAD AN ENDOSCOPIC PROCEDURE TODAY: Refer to the procedure report and other information in the discharge instructions given to you for any specific questions about what was found during the examination. If this information does not answer your questions, please call [place] office at [phone] to clarify.  ° °YOU SHOULD EXPECT: Some feelings of bloating in the abdomen. Passage of more gas than usual. Walking can help get rid of the air that was put into your GI tract during the procedure and reduce the bloating. If you had a lower endoscopy (such as a colonoscopy or flexible sigmoidoscopy) you may notice spotting of blood in your stool or on the toilet paper. Some abdominal soreness may be present for a day or two, also. ° °DIET: Your first meal following the procedure should be a light meal and then it is ok to progress to your normal diet. A half-sandwich or bowl of soup is an example of a good first meal. Heavy or fried foods are harder to digest and may make you feel nauseous or bloated. Drink plenty of fluids but you should avoid alcoholic beverages for 24 hours. If you had a esophageal dilation, please see attached instructions for diet.   ° °ACTIVITY: Your care partner should take you home directly after the procedure. You should plan to take it easy, moving slowly for the rest of the day. You can resume normal activity the day after the procedure however YOU SHOULD NOT DRIVE, use power tools, machinery or perform tasks that involve climbing or major physical exertion for 24 hours (because of the sedation medicines used during the test).  ° °SYMPTOMS TO REPORT IMMEDIATELY: °A gastroenterologist can be reached at any hour. Please call [phone]  for any of the following symptoms:  °Following lower endoscopy (colonoscopy, flexible sigmoidoscopy) °Excessive amounts of blood in the stool  °Significant tenderness, worsening of abdominal pains  °Swelling of the abdomen that is new, acute  °Fever of 100° or  higher  °Following upper endoscopy (EGD, EUS, ERCP, esophageal dilation) °Vomiting of blood or coffee ground material  °New, significant abdominal pain  °New, significant chest pain or pain under the shoulder blades  °Painful or persistently difficult swallowing  °New shortness of breath  °Black, tarry-looking or red, bloody stools ° °FOLLOW UP:  °If any biopsies were taken you will be contacted by phone or by letter within the next 1-3 weeks. Call [phone]  if you have not heard about the biopsies in 3 weeks.  °Please also call with any specific questions about appointments or follow up tests. ° °

## 2020-01-20 ENCOUNTER — Ambulatory Visit (HOSPITAL_COMMUNITY): Admit: 2020-01-20 | Payer: BC Managed Care – PPO | Admitting: Gastroenterology

## 2020-01-24 ENCOUNTER — Telehealth: Payer: Self-pay | Admitting: *Deleted

## 2020-01-24 NOTE — Progress Notes (Signed)
Has armband been applied?  Yes  Does patient have an allergy to IV contrast dye?: No   Has patient ever received premedication for IV contrast dye?: n/a  Does patient take metformin?: Yes  If patient does take metformin when was the last dose: 01/25/2020  Date of lab work: 12/16/2019 BUN: 10 CR: 0.53 Egfr: >60  IV site: None  Has IV site been added to flowsheet?  N/a  No contrast today per Dr. Lisbeth Renshaw due to having taken dose of metformin this morning.

## 2020-01-24 NOTE — Telephone Encounter (Signed)
Left a voicemail for the patient to inform her not to take her Metformin on 01/25/2020 prior to her scheduled CT simulation.  She is receiving IV contrast and would need to refrain from taking Metformin until we repeat her lab work on 01/27/2020.  Call back number left in the event she has further questions.  Will continue to follow as necessary.  Gloriajean Dell. Leonie Green, BSN

## 2020-01-25 ENCOUNTER — Other Ambulatory Visit: Payer: Self-pay

## 2020-01-25 ENCOUNTER — Ambulatory Visit
Admission: RE | Admit: 2020-01-25 | Discharge: 2020-01-25 | Disposition: A | Discharge status: Home / Self Care | Payer: BC Managed Care – PPO | Source: Ambulatory Visit | Attending: Radiation Oncology | Admitting: Radiation Oncology

## 2020-01-25 VITALS — BP 146/94 | HR 84 | Temp 97.3°F | Resp 18 | Ht 63.0 in | Wt 211.6 lb

## 2020-01-25 DIAGNOSIS — C250 Malignant neoplasm of head of pancreas: Secondary | ICD-10-CM | POA: Diagnosis not present

## 2020-01-25 DIAGNOSIS — Z510 Encounter for antineoplastic radiation therapy: Secondary | ICD-10-CM | POA: Diagnosis present

## 2020-01-31 ENCOUNTER — Other Ambulatory Visit: Payer: Self-pay | Admitting: Nurse Practitioner

## 2020-01-31 DIAGNOSIS — I10 Essential (primary) hypertension: Secondary | ICD-10-CM

## 2020-02-08 ENCOUNTER — Ambulatory Visit: Payer: BC Managed Care – PPO | Admitting: Radiation Oncology

## 2020-02-09 ENCOUNTER — Ambulatory Visit: Payer: BC Managed Care – PPO

## 2020-02-10 ENCOUNTER — Telehealth: Payer: Self-pay | Admitting: Hematology & Oncology

## 2020-02-10 ENCOUNTER — Inpatient Hospital Stay (HOSPITAL_BASED_OUTPATIENT_CLINIC_OR_DEPARTMENT_OTHER): Payer: BC Managed Care – PPO | Admitting: Hematology & Oncology

## 2020-02-10 ENCOUNTER — Encounter: Payer: Self-pay | Admitting: Hematology & Oncology

## 2020-02-10 ENCOUNTER — Other Ambulatory Visit: Payer: Self-pay

## 2020-02-10 ENCOUNTER — Inpatient Hospital Stay: Payer: BC Managed Care – PPO

## 2020-02-10 ENCOUNTER — Inpatient Hospital Stay: Payer: BC Managed Care – PPO | Attending: Hematology & Oncology

## 2020-02-10 ENCOUNTER — Ambulatory Visit: Payer: BC Managed Care – PPO | Admitting: Radiation Oncology

## 2020-02-10 VITALS — BP 136/74 | HR 91 | Temp 97.1°F | Resp 18 | Ht 63.5 in | Wt 215.0 lb

## 2020-02-10 DIAGNOSIS — C241 Malignant neoplasm of ampulla of Vater: Secondary | ICD-10-CM | POA: Insufficient documentation

## 2020-02-10 DIAGNOSIS — C250 Malignant neoplasm of head of pancreas: Secondary | ICD-10-CM | POA: Diagnosis not present

## 2020-02-10 LAB — CBC WITH DIFFERENTIAL (CANCER CENTER ONLY)
Abs Immature Granulocytes: 0.06 10*3/uL (ref 0.00–0.07)
Basophils Absolute: 0.1 10*3/uL (ref 0.0–0.1)
Basophils Relative: 1 %
Eosinophils Absolute: 0.2 10*3/uL (ref 0.0–0.5)
Eosinophils Relative: 2 %
HCT: 38.9 % (ref 36.0–46.0)
Hemoglobin: 12.3 g/dL (ref 12.0–15.0)
Immature Granulocytes: 1 %
Lymphocytes Relative: 25 %
Lymphs Abs: 2.7 10*3/uL (ref 0.7–4.0)
MCH: 27 pg (ref 26.0–34.0)
MCHC: 31.6 g/dL (ref 30.0–36.0)
MCV: 85.3 fL (ref 80.0–100.0)
Monocytes Absolute: 0.5 10*3/uL (ref 0.1–1.0)
Monocytes Relative: 5 %
Neutro Abs: 7.1 10*3/uL (ref 1.7–7.7)
Neutrophils Relative %: 66 %
Platelet Count: 335 10*3/uL (ref 150–400)
RBC: 4.56 MIL/uL (ref 3.87–5.11)
RDW: 14.4 % (ref 11.5–15.5)
WBC Count: 10.5 10*3/uL (ref 4.0–10.5)
nRBC: 0 % (ref 0.0–0.2)

## 2020-02-10 LAB — CMP (CANCER CENTER ONLY)
ALT: 19 U/L (ref 0–44)
AST: 17 U/L (ref 15–41)
Albumin: 4.1 g/dL (ref 3.5–5.0)
Alkaline Phosphatase: 64 U/L (ref 38–126)
Anion gap: 7 (ref 5–15)
BUN: 13 mg/dL (ref 8–23)
CO2: 31 mmol/L (ref 22–32)
Calcium: 10 mg/dL (ref 8.9–10.3)
Chloride: 102 mmol/L (ref 98–111)
Creatinine: 0.62 mg/dL (ref 0.44–1.00)
GFR, Est AFR Am: >60 mL/min (ref >60)
GFR, Estimated: >60 mL/min (ref >60)
Glucose, Bld: 153 mg/dL — ABNORMAL HIGH (ref 70–99)
Potassium: 3.7 mmol/L (ref 3.5–5.1)
Sodium: 140 mmol/L (ref 135–145)
Total Bilirubin: 0.3 mg/dL (ref 0.3–1.2)
Total Protein: 7.5 g/dL (ref 6.5–8.1)

## 2020-02-10 NOTE — Telephone Encounter (Signed)
Appointments scheduled calendar printed per 6/24 los 

## 2020-02-10 NOTE — Progress Notes (Signed)
Hematology and Oncology Follow Up Visit  Melanie Alvarez [id number] 1956/05/04 64 y.o. 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  She is doing okay right now.  She did see Dr. Barry Dienes of surgical oncology.  Dr. Barry Dienes felt that Melanie Alvarez would be a good candidate for a Whipple procedure.  However, Melanie Alvarez did not think that she needed this.  She subsequently has declined surgery.  Dr. Barry Dienes then referred Melanie Alvarez over to radiation oncology.  She will have stereotactic radiosurgery to the pancreatic lesion.  I think this will start next week.  Melanie Alvarez does look great.  She feels good.  She wants go back to work.  She has had no abdominal pain.  She has had no nausea or vomiting.  She has had no change in bowel or bladder habits.  She has had no fever.  Her last CA 9-9 was still normal at < 2.  Her main problem actually is her left shoulder.  She is having a hard time fully abducting this.  She can only go about halfway up.  I have to suspect that she has a rotator cuff tear.  We will get an MRI and she likely will need to go to orthopedic surgery.  She will not agree to any surgery however.  Currently, her performance status is ECOG 0.    Medications:  Allergies as of 02/10/2020      Reactions   Lisinopril Swelling   Facial and upper lip      Medication List       Accurate as of February 10, 2020  1:37 PM. If you have any questions, ask your nurse or doctor.        amLODipine 10 MG tablet Commonly known as: NORVASC Take 1 tablet (10 mg total) by mouth at bedtime.   aspirin EC 81 MG tablet Take 81 mg by mouth daily.   atorvastatin 20 MG tablet Commonly known as: LIPITOR Take 20 mg by mouth daily.   Cholecalciferol 25 MCG (1000 UT) tablet Take 1,000 Units  by mouth daily.   Eye Drops 0.05 % ophthalmic solution Generic drug: tetrahydrozoline Place 1 drop into both eyes daily as needed (burning eye).   glimepiride 2 MG tablet Commonly known as: Amaryl Take 1 tablet (2 mg total) by mouth daily with breakfast.   glucose blood test strip Test blood glucose once daily. One Touch Verio test strips   GOLD BOND MEDICATED BODY EX Apply 1 application topically daily.   Lancet Device Misc Check blood glucose once daily. One TCH 40J Delicapl LNC   lipase/protease/amylase 36000 UNITS Cpep capsule Commonly known as: Creon Take 2 capsules (72,000 Units total) by mouth 3 (three) times daily before meals. What changed: when to take this   loperamide 2 MG tablet Commonly known as: Imodium A-D Take 2 at onset of diarrhea, then 1 every 2hrs until 12hr without a BM. May take 2 tab every 4hrs at bedtime. If diarrhea recurs repeat. What changed:   how much to take  how to take this  when to take this  reasons to take this   metFORMIN 500 MG tablet Commonly known as: GLUCOPHAGE Take 1 tablet (500 mg total) by mouth 2 (two) times daily with a meal.   metoprolol  succinate 25 MG 24 hr tablet Commonly known as: TOPROL-XL Take 1 tablet (25 mg total) by mouth daily. Take with or immediately following a meal.   multivitamin with minerals tablet Take 1 tablet by mouth daily.   olmesartan 5 MG tablet Commonly known as: BENICAR Take 1 tablet by mouth once daily   pantoprazole 40 MG tablet Commonly known as: PROTONIX Take 1 tablet (40 mg total) by mouth 2 (two) times daily.       Allergies:  Allergies  Allergen Reactions  . Lisinopril Swelling    Facial and upper lip    Past Medical History, Surgical history, Social history, and Family History were reviewed and updated.  Review of Systems: Review of Systems  Constitutional: Negative.   HENT: Negative.   Eyes: Negative.   Respiratory: Negative.   Cardiovascular: Negative.     Gastrointestinal: Negative.   Genitourinary: Negative.   Musculoskeletal: Negative.   Skin: Negative.   Neurological: Negative.   Endo/Heme/Allergies: Negative.   Psychiatric/Behavioral: Negative.       Physical Exam:  height is 5' 3.5" (1.613 m) and weight is 215 lb (97.5 kg). Her temporal temperature is 97.1 F (36.2 C) (abnormal). Her blood pressure is 136/74 and her pulse is 91. Her respiration is 18 and oxygen saturation is 100%.   Wt Readings from Last 3 Encounters:  02/10/20 215 lb (97.5 kg)  01/25/20 211 lb 9.6 oz (96 kg)  01/19/20 209 lb 14.1 oz (95.2 kg)    Physical Exam Vitals reviewed.  HENT:     Head: Normocephalic and atraumatic.  Eyes:     Pupils: Pupils are equal, round, and reactive to light.  Cardiovascular:     Rate and Rhythm: Normal rate and regular rhythm.     Heart sounds: Normal heart sounds.  Pulmonary:     Effort: Pulmonary effort is normal.     Breath sounds: Normal breath sounds.  Abdominal:     General: Bowel sounds are normal.     Palpations: Abdomen is soft.  Musculoskeletal:        General: No tenderness or deformity. Normal range of motion.     Cervical back: Normal range of motion.  Lymphadenopathy:     Cervical: No cervical adenopathy.  Skin:    General: Skin is warm and dry.     Findings: No erythema or rash.  Neurological:     Mental Status: She is alert and oriented to person, place, and time.  Psychiatric:        Behavior: Behavior normal.        Thought Content: Thought content normal.        Judgment: Judgment normal.      Lab Results  Component Value Date   WBC 10.5 02/10/2020   HGB 12.3 02/10/2020   HCT 38.9 02/10/2020   MCV 85.3 02/10/2020   PLT 335 02/10/2020   No results found for: FERRITIN, IRON, TIBC, UIBC, IRONPCTSAT Lab Results  Component Value Date   RBC 4.56 02/10/2020   No results found for: KPAFRELGTCHN, LAMBDASER, KAPLAMBRATIO No results found for: IGGSERUM, IGA, IGMSERUM No results found for:  Odetta Pink, SPEI   Chemistry      Component Value Date/Time   NA 140 02/10/2020 0926   K 3.7 02/10/2020 0926   CL 102 02/10/2020 0926   CO2 31 02/10/2020 0926   BUN 13 02/10/2020 0926   CREATININE 0.62 02/10/2020 0926   CREATININE 0.78 03/23/2018 1018  Component Value Date/Time   CALCIUM 10.0 02/10/2020 0926   ALKPHOS 64 02/10/2020 0926   AST 17 02/10/2020 0926   ALT 19 02/10/2020 0926   ALT 40 (H) 11/25/2017 0947   BILITOT 0.3 02/10/2020 0926       Impression and Plan: Melanie Alvarez is a very pleasant 64 yo African American female with adenocarcinoma of the pancreas, ampullary carcinoma.  I really wish that she would have agreed to surgery.  I really think this is the best way to try to cure this malignancy.  However, she has a strong faith.  I totally understand this.  I cannot argue with this I suppose.  She is very convicted with respect to her faith and that she feels that surgery is not going to benefit her.  We will have to see what her MRI shows of the shoulder.  I will plan to get her back here in about 6 weeks or so.  We probably would not need any scans for about 2 months or so after the stereotactic radiosurgery to let the inflammation inside decrease.     Volanda Napoleon, MD 6/24/20211:37 PM

## 2020-02-11 ENCOUNTER — Ambulatory Visit: Payer: BC Managed Care – PPO

## 2020-02-11 DIAGNOSIS — C250 Malignant neoplasm of head of pancreas: Secondary | ICD-10-CM | POA: Diagnosis not present

## 2020-02-11 LAB — CANCER ANTIGEN 19-9: CA 19-9: <2 U/mL (ref 0–35)

## 2020-02-14 ENCOUNTER — Other Ambulatory Visit: Payer: BC Managed Care – PPO

## 2020-02-14 ENCOUNTER — Ambulatory Visit
Admission: RE | Admit: 2020-02-14 | Discharge: 2020-02-14 | Disposition: A | Discharge status: Home / Self Care | Payer: BC Managed Care – PPO | Source: Ambulatory Visit | Attending: Radiation Oncology | Admitting: Radiation Oncology

## 2020-02-14 ENCOUNTER — Other Ambulatory Visit: Payer: Self-pay

## 2020-02-14 DIAGNOSIS — C250 Malignant neoplasm of head of pancreas: Secondary | ICD-10-CM | POA: Diagnosis not present

## 2020-02-16 ENCOUNTER — Other Ambulatory Visit: Payer: Self-pay

## 2020-02-16 ENCOUNTER — Ambulatory Visit
Admission: RE | Admit: 2020-02-16 | Discharge: 2020-02-16 | Disposition: A | Discharge status: Home / Self Care | Payer: BC Managed Care – PPO | Source: Ambulatory Visit | Attending: Radiation Oncology | Admitting: Radiation Oncology

## 2020-02-16 DIAGNOSIS — C250 Malignant neoplasm of head of pancreas: Secondary | ICD-10-CM | POA: Diagnosis not present

## 2020-02-18 ENCOUNTER — Other Ambulatory Visit: Payer: Self-pay

## 2020-02-18 ENCOUNTER — Ambulatory Visit
Admission: RE | Admit: 2020-02-18 | Discharge: 2020-02-18 | Disposition: A | Discharge status: Home / Self Care | Payer: BC Managed Care – PPO | Source: Ambulatory Visit | Attending: Radiation Oncology | Admitting: Radiation Oncology

## 2020-02-18 DIAGNOSIS — Z510 Encounter for antineoplastic radiation therapy: Secondary | ICD-10-CM | POA: Diagnosis present

## 2020-02-18 DIAGNOSIS — C250 Malignant neoplasm of head of pancreas: Secondary | ICD-10-CM | POA: Diagnosis present

## 2020-02-19 ENCOUNTER — Ambulatory Visit (HOSPITAL_COMMUNITY): Payer: BC Managed Care – PPO

## 2020-02-22 ENCOUNTER — Other Ambulatory Visit: Payer: Self-pay

## 2020-02-22 ENCOUNTER — Ambulatory Visit
Admission: RE | Admit: 2020-02-22 | Discharge: 2020-02-22 | Disposition: A | Discharge status: Home / Self Care | Payer: BC Managed Care – PPO | Source: Ambulatory Visit | Attending: Radiation Oncology | Admitting: Radiation Oncology

## 2020-02-22 DIAGNOSIS — C250 Malignant neoplasm of head of pancreas: Secondary | ICD-10-CM | POA: Diagnosis not present

## 2020-02-23 ENCOUNTER — Ambulatory Visit: Payer: BC Managed Care – PPO | Admitting: Radiation Oncology

## 2020-02-23 ENCOUNTER — Other Ambulatory Visit: Payer: BC Managed Care – PPO

## 2020-02-24 ENCOUNTER — Ambulatory Visit
Admission: RE | Admit: 2020-02-24 | Discharge: 2020-02-24 | Disposition: A | Discharge status: Home / Self Care | Payer: BC Managed Care – PPO | Source: Ambulatory Visit | Attending: Radiation Oncology | Admitting: Radiation Oncology

## 2020-02-24 ENCOUNTER — Other Ambulatory Visit: Payer: Self-pay

## 2020-02-24 ENCOUNTER — Encounter: Payer: Self-pay | Admitting: Radiation Oncology

## 2020-02-24 DIAGNOSIS — C250 Malignant neoplasm of head of pancreas: Secondary | ICD-10-CM | POA: Diagnosis not present

## 2020-03-10 ENCOUNTER — Ambulatory Visit (HOSPITAL_COMMUNITY)
Admission: RE | Admit: 2020-03-10 | Discharge: 2020-03-10 | Disposition: A | Discharge status: Home / Self Care | Payer: BC Managed Care – PPO | Source: Ambulatory Visit | Attending: Hematology & Oncology | Admitting: Hematology & Oncology

## 2020-03-10 ENCOUNTER — Other Ambulatory Visit: Payer: Self-pay | Admitting: Hematology & Oncology

## 2020-03-10 ENCOUNTER — Other Ambulatory Visit: Payer: Self-pay

## 2020-03-10 DIAGNOSIS — C250 Malignant neoplasm of head of pancreas: Secondary | ICD-10-CM | POA: Insufficient documentation

## 2020-03-10 IMAGING — MR MR SHOULDER*L* W/O CM
5 series · 35 of 40 positions shown · non-contrast
Comparison: None.

CLINICAL DATA: Chronic left shoulder pain.

EXAM:
MRI OF THE LEFT SHOULDER WITHOUT CONTRAST
TECHNIQUE: Multiplanar, multisequence MR imaging of the shoulder was performed.
No intravenous contrast was administered.

[Series 9: PD fat-sat · axial · left · 4.0mm · 0.44mm/px · z∈[-77,+9]mm · 8 of 23 slices shown (1 of 2)]
[im 1/23]
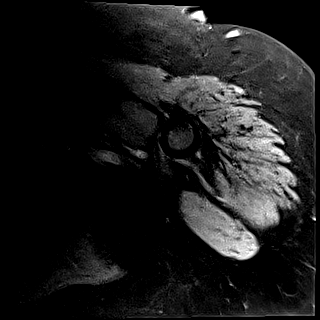
[im 4/23]
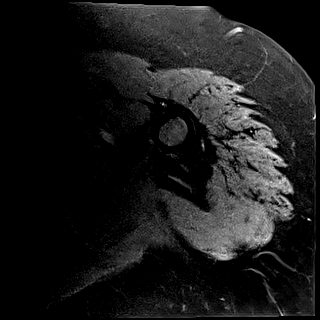
[im 7/23]
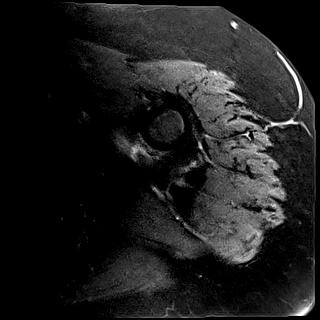
[im 10/23]
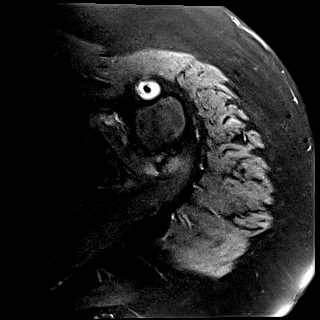
[im 13/23]
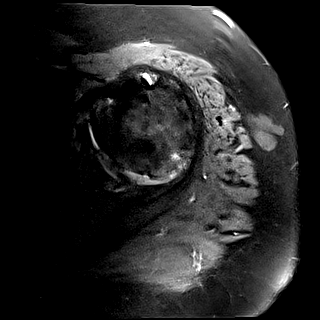
[im 16/23]
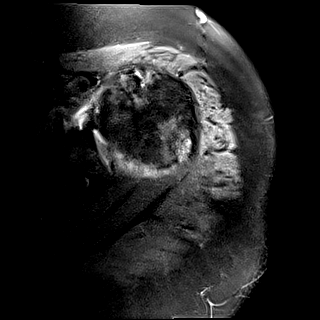
[im 19/23]
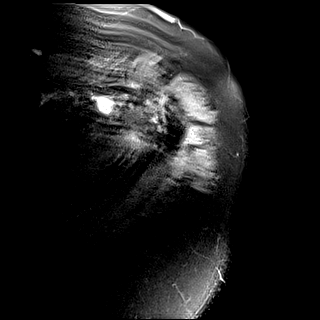
[im 23/23]
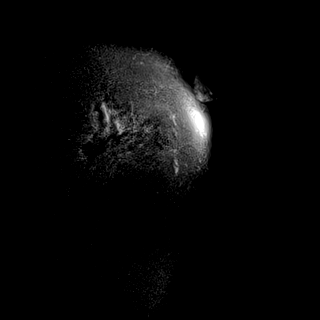

[Series 10: T1 fat-sat · axial · non-contrast · left · 4.0mm · 0.27mm/px · z∈[-77,+9]mm · 8 of 23 slices shown]
[im 1/23]
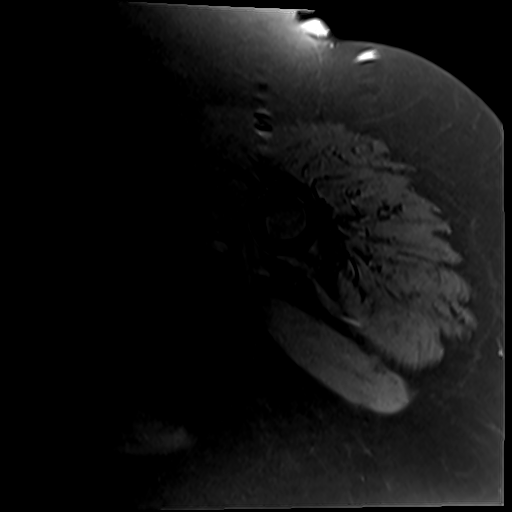
[im 4/23]
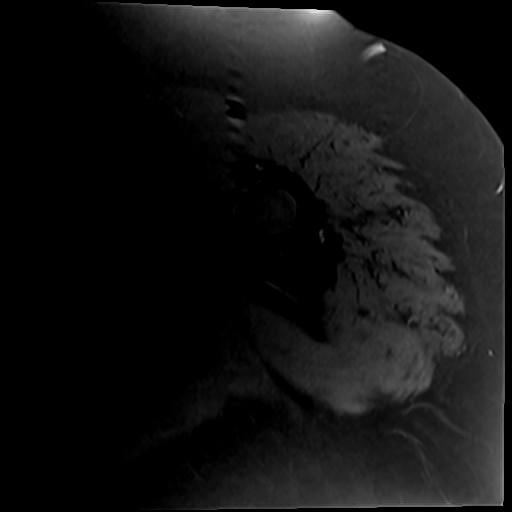
[im 7/23]
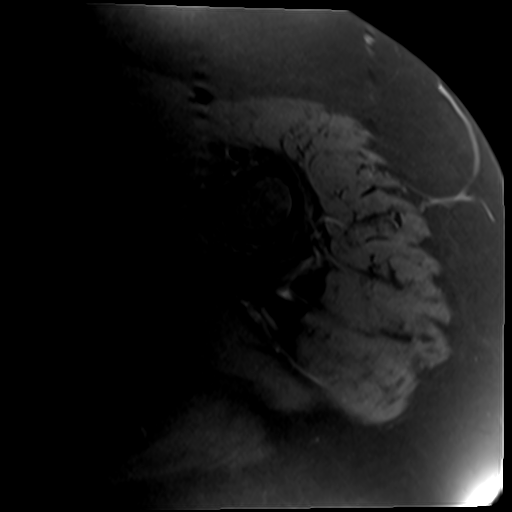
[im 10/23]
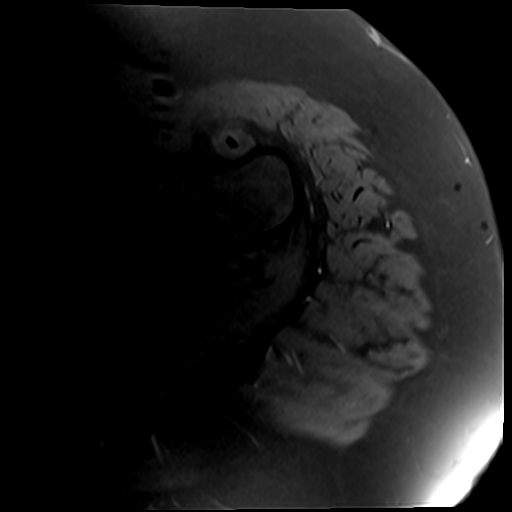
[im 13/23]
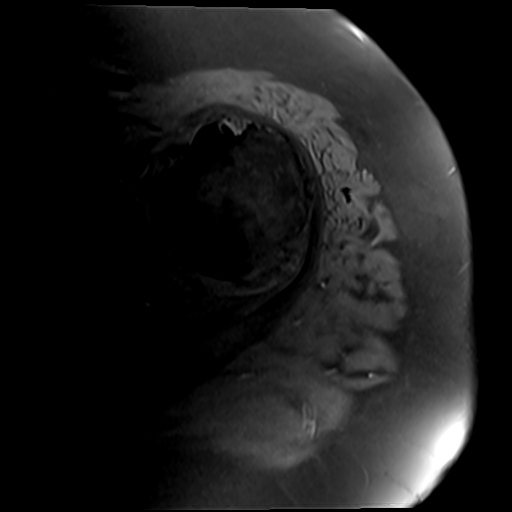
[im 16/23]
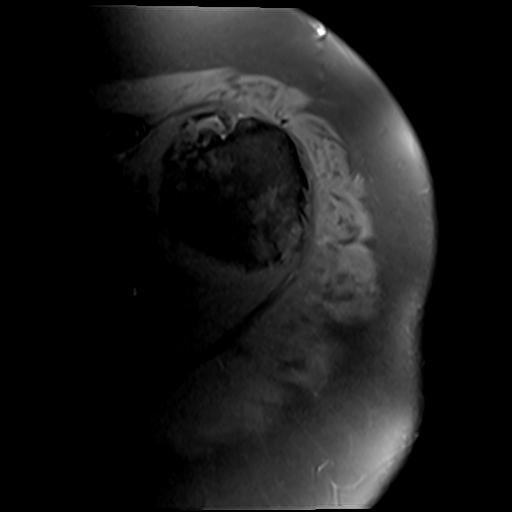
[im 19/23]
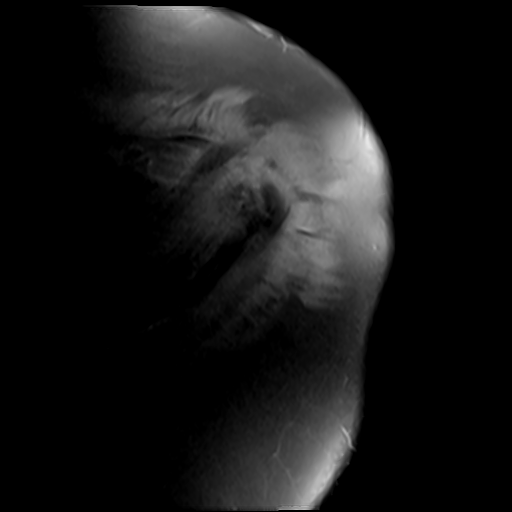
[im 23/23]
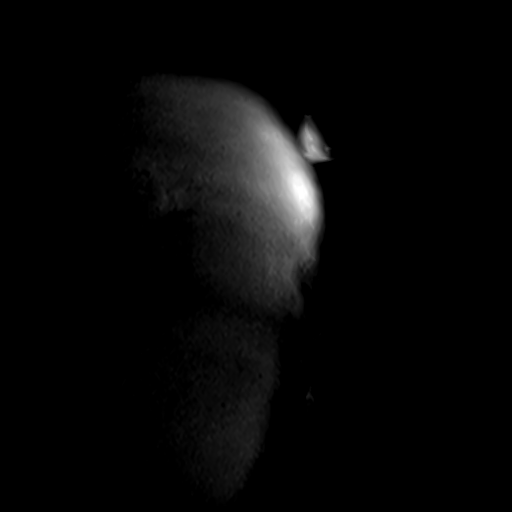

[Series 11: T2 fat-sat · oblique · left · 4.0mm · 0.44mm/px · 7 of 22 slices shown]
[im 1/22]
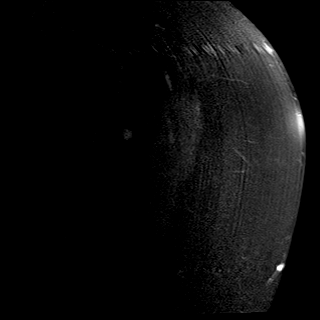
[im 4/22]
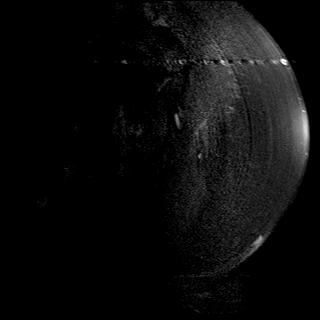
[im 8/22]
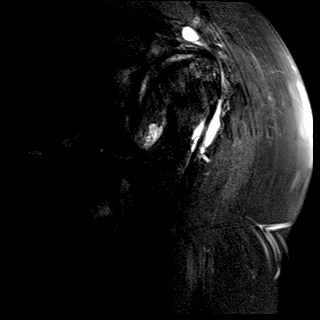
[im 11/22]
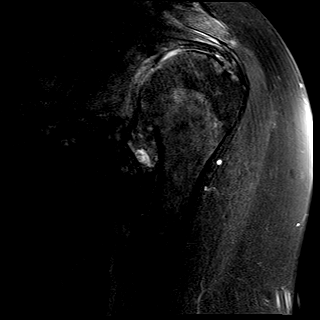
[im 15/22]
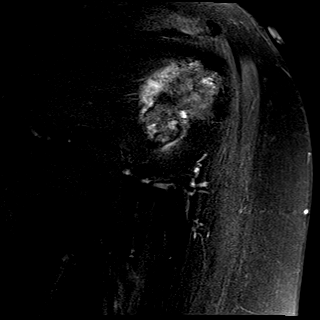
[im 18/22]
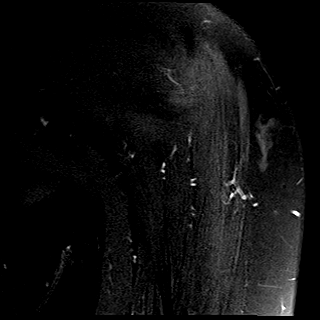
[im 22/22]
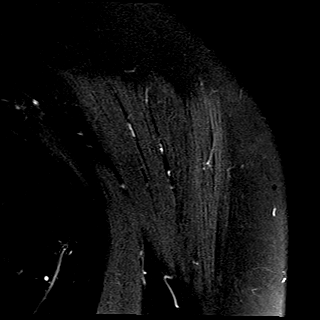

[Series 12: PD fat-sat · oblique · left · 4.0mm · 0.44mm/px · 7 of 20 slices shown (2 of 2)]
[im 1/20]
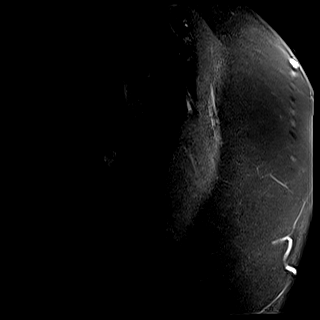
[im 4/20]
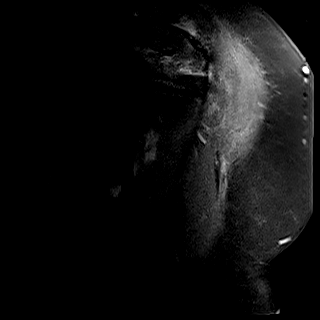
[im 7/20]
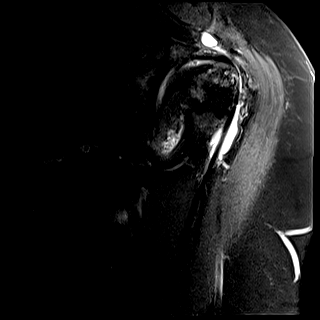
[im 10/20]
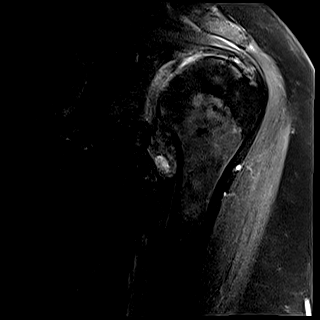
[im 13/20]
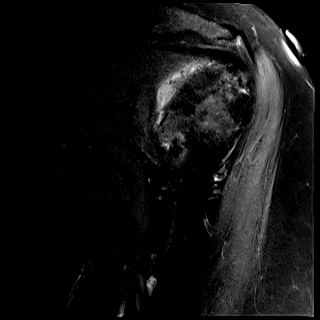
[im 16/20]
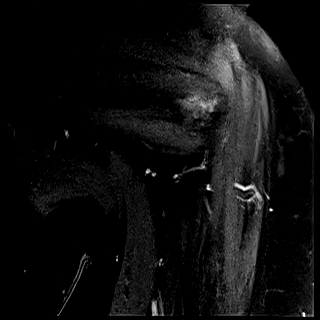
[im 20/20]
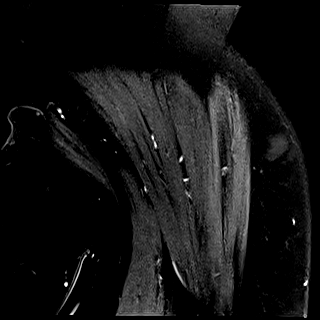

[Series 13: T1 · oblique · left · 3.0mm · 0.55mm/px · 5 of 31 slices shown]
[im 1/31]
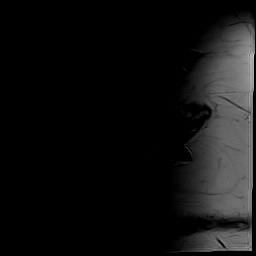
[im 4/31]
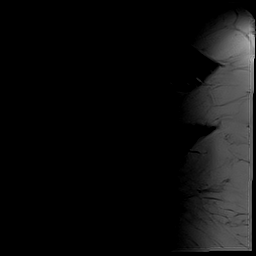
[im 11/31]
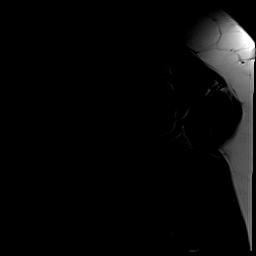
[im 14/31]
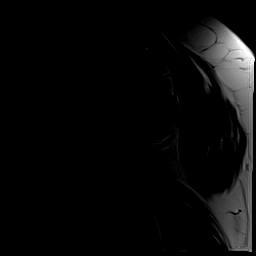
[im 17/31]
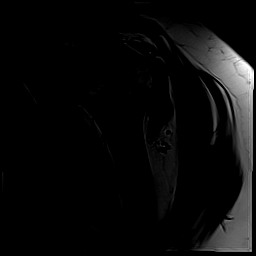

[35 of 40 positions shown; findings below may reference images not displayed]

FINDINGS: Examination was prematurely terminated secondary to pain.

Rotator cuff: Mild tendinosis of the supraspinatus tendon.
Infraspinatus tendon is intact. Teres minor tendon is intact.
Subscapularis tendon is intact.

Muscles: No muscle atrophy or edema. No intramuscular fluid
collection or hematoma.

Biceps Long Head: Intraarticular and extraarticular portions of the
biceps tendon are intact.

Acromioclavicular Joint: Moderate arthropathy of the
acromioclavicular joint. Type I acromion. Trace
subacromial/subdeltoid bursal fluid.

Glenohumeral Joint: Small joint effusion with synovitis. Extensive
full-thickness cartilage loss of the glenohumeral joint with bulky
inferior humeral marginal osteophyte.

Labrum: Diffuse labral degeneration.

Bones: No acute fracture or dislocation. No aggressive osseous
lesion. Subcortical reactive marrow changes at the infraspinatus
insertion.

Other: 1.5 x 0.9 x 2.2 cm lobulated T1 hyperintense and T2
intermediate signal masslike area in the subcutaneous fat overlying
the lateral aspect of the deltoid muscle likely reflecting an area
of fat necrosis or a small hematoma/contusion.
IMPRESSION: 1. Severe osteoarthritis of the left glenohumeral joint.
2. Mild tendinosis of the supraspinatus tendon.
3. A 1.5 x 0.9 x 2.2 cm lobulated T1 hyperintense and T2
intermediate signal masslike area in the subcutaneous fat overlying
the lateral aspect of the deltoid muscle likely reflecting an area
of fat necrosis or a small hematoma/contusion.

## 2020-03-10 NOTE — Progress Notes (Signed)
MRI of the left shoulder without gadolinium completed. Patient terminated the exam due to pain and refused to continue.

## 2020-03-15 ENCOUNTER — Telehealth: Payer: Self-pay | Admitting: *Deleted

## 2020-03-15 NOTE — Telephone Encounter (Signed)
-----   Message from Volanda Napoleon, MD sent at 03/13/2020  4:56 PM EDT ----- Call - there is tendonitis in the left shoulder. You need to see the Orthopedist.  Do you have one??  Laurey Arrow

## 2020-03-15 NOTE — Telephone Encounter (Signed)
As noted below by Dr. Marin Olp, I informed the patient that there is tendonitis in the left shoulder. You need to see an Orthopedist. Dr. Marin Olp uses Dr. Rhona Raider. Information given. She verbalized understanding.

## 2020-03-16 ENCOUNTER — Telehealth: Payer: Self-pay | Admitting: Radiation Oncology

## 2020-03-16 NOTE — Telephone Encounter (Signed)
  Radiation Oncology         [phone] ________________________________  Name: Melanie Alvarez MRN: [id number]  Date of Service: 03/16/2020  DOB: 12/26/1955  Post Treatment Telephone Note  Diagnosis:  Clinical stage II-III adenocarcinoma of the head of the pancreas  Interval Since Last Radiation:  3 weeks   02/14/20-02/24/20 SBRT Treatment:  The head of pancreas target was treated to 33 Gy in 5 fractions.  Narrative:  The patient was contacted today for routine follow-up. During treatment she did very well with radiotherapy and did not have significant desquamation. She reports she is doing great and is not having any concerns about radiotherapy. She is having some trouble with her left shoulder and plans to meet with orthopedics soon for evaluation.  Impression/Plan: 1. Clinical stage II-III adenocarcinoma of the head of the pancreas. The patient has been doing well since completion of radiotherapy. We discussed that we would be happy to continue to follow her as needed, but she will also continue to follow up with Dr. Marin Olp in medical oncology.     Carola Rhine, PAC

## 2020-03-22 ENCOUNTER — Inpatient Hospital Stay: Payer: BC Managed Care – PPO

## 2020-03-22 ENCOUNTER — Inpatient Hospital Stay: Payer: BC Managed Care – PPO | Admitting: Hematology & Oncology

## 2020-03-25 NOTE — Progress Notes (Signed)
  Radiation Oncology         [phone] ________________________________  Name: Melanie Alvarez MRN: [id number]  Date: 02/24/2020  DOB: 08/28/1955  End of Treatment Note  Diagnosis:   pancreatic cancer     Indication for treatment::  curative       Radiation treatment dates:   02/08/20 - 02/24/20  Site/dose:   the abdomen was treated to a dose of 33 Gy in 5 fractions, corresponding to a course of SBRT.  Narrative: The patient tolerated radiation treatment relatively well.   No major issues of nausea, diarrhea.  Plan: The patient has completed radiation treatment. The patient will return to radiation oncology clinic for routine followup in one month. I advised the patient to call or return sooner if they have any questions or concerns related to their recovery or treatment. ________________________________  Jodelle Gross, M.D., Ph.D.

## 2020-03-30 ENCOUNTER — Inpatient Hospital Stay: Payer: BC Managed Care – PPO

## 2020-03-30 ENCOUNTER — Inpatient Hospital Stay (HOSPITAL_BASED_OUTPATIENT_CLINIC_OR_DEPARTMENT_OTHER): Payer: BC Managed Care – PPO | Admitting: Hematology & Oncology

## 2020-03-30 ENCOUNTER — Other Ambulatory Visit: Payer: Self-pay

## 2020-03-30 ENCOUNTER — Encounter: Payer: Self-pay | Admitting: Hematology & Oncology

## 2020-03-30 ENCOUNTER — Inpatient Hospital Stay: Payer: BC Managed Care – PPO | Attending: Hematology & Oncology

## 2020-03-30 VITALS — BP 155/80 | HR 80 | Temp 98.1°F | Resp 17 | Wt 216.0 lb

## 2020-03-30 DIAGNOSIS — Z95828 Presence of other vascular implants and grafts: Secondary | ICD-10-CM

## 2020-03-30 DIAGNOSIS — Z452 Encounter for adjustment and management of vascular access device: Secondary | ICD-10-CM | POA: Insufficient documentation

## 2020-03-30 DIAGNOSIS — C250 Malignant neoplasm of head of pancreas: Secondary | ICD-10-CM | POA: Diagnosis not present

## 2020-03-30 DIAGNOSIS — C241 Malignant neoplasm of ampulla of Vater: Secondary | ICD-10-CM | POA: Insufficient documentation

## 2020-03-30 LAB — CBC WITH DIFFERENTIAL (CANCER CENTER ONLY)
Abs Immature Granulocytes: 0.04 10*3/uL (ref 0.00–0.07)
Basophils Absolute: 0.1 10*3/uL (ref 0.0–0.1)
Basophils Relative: 1 %
Eosinophils Absolute: 0.2 10*3/uL (ref 0.0–0.5)
Eosinophils Relative: 2 %
HCT: 38 % (ref 36.0–46.0)
Hemoglobin: 12.5 g/dL (ref 12.0–15.0)
Immature Granulocytes: 0 %
Lymphocytes Relative: 17 %
Lymphs Abs: 1.8 10*3/uL (ref 0.7–4.0)
MCH: 27.9 pg (ref 26.0–34.0)
MCHC: 32.9 g/dL (ref 30.0–36.0)
MCV: 84.8 fL (ref 80.0–100.0)
Monocytes Absolute: 0.7 10*3/uL (ref 0.1–1.0)
Monocytes Relative: 6 %
Neutro Abs: 8 10*3/uL — ABNORMAL HIGH (ref 1.7–7.7)
Neutrophils Relative %: 74 %
Platelet Count: 279 10*3/uL (ref 150–400)
RBC: 4.48 MIL/uL (ref 3.87–5.11)
RDW: 13.5 % (ref 11.5–15.5)
WBC Count: 10.8 10*3/uL — ABNORMAL HIGH (ref 4.0–10.5)
nRBC: 0 % (ref 0.0–0.2)

## 2020-03-30 LAB — CMP (CANCER CENTER ONLY)
ALT: 16 U/L (ref 0–44)
AST: 18 U/L (ref 15–41)
Albumin: 4.1 g/dL (ref 3.5–5.0)
Alkaline Phosphatase: 73 U/L (ref 38–126)
Anion gap: 8 (ref 5–15)
BUN: 16 mg/dL (ref 8–23)
CO2: 31 mmol/L (ref 22–32)
Calcium: 10 mg/dL (ref 8.9–10.3)
Chloride: 100 mmol/L (ref 98–111)
Creatinine: 0.67 mg/dL (ref 0.44–1.00)
GFR, Est AFR Am: >60 mL/min (ref >60)
GFR, Estimated: >60 mL/min (ref >60)
Glucose, Bld: 72 mg/dL (ref 70–99)
Potassium: 3.6 mmol/L (ref 3.5–5.1)
Sodium: 139 mmol/L (ref 135–145)
Total Bilirubin: 0.3 mg/dL (ref 0.3–1.2)
Total Protein: 7.2 g/dL (ref 6.5–8.1)

## 2020-03-30 MED ORDER — HEPARIN SOD (PORK) LOCK FLUSH 100 UNIT/ML IV SOLN
500.0000 [IU] | Freq: Once | INTRAVENOUS | Status: AC
  Administered 2020-03-30: 500 [IU] via INTRAVENOUS
  Filled 2020-03-30: qty 5

## 2020-03-30 MED ORDER — SODIUM CHLORIDE 0.9% FLUSH
10.0000 mL | Freq: Once | INTRAVENOUS | Status: AC
  Administered 2020-03-30: 10 mL via INTRAVENOUS
  Filled 2020-03-30: qty 10

## 2020-03-30 NOTE — Patient Instructions (Signed)

## 2020-03-30 NOTE — Progress Notes (Signed)
Hematology and Oncology Follow Up Visit  Melanie Alvarez [id number] Feb 07, 1956 64 y.o. 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  She really looks good.  She really has had no complaints.  Her biggest problem is her left shoulder.  She sees the orthopedist tomorrow.  I suspect she may need to have shoulder surgery.  I do not have any problems with her having an shoulder surgery.  She completed radiosurgery to the pancreas in early July.  She received 33 Gy and 5 fractions.  She had no problems with this.  She has had no issues with nausea or vomiting.  She has had no change in bowel or bladder habits.  There has been no bleeding.  She has had no cough or shortness of breath.  There is been no bleeding.  She had a little bit of swelling over in the left leg but this is been chronic.  Overall, I would say performance status is ECOG 1.    Medications:  Allergies as of 03/30/2020      Reactions   Lisinopril Swelling   Facial and upper lip      Medication List       Accurate as of March 30, 2020  3:33 PM. If you have any questions, ask your nurse or doctor.        amLODipine 10 MG tablet Commonly known as: NORVASC Take 1 tablet (10 mg total) by mouth at bedtime.   aspirin EC 81 MG tablet Take 81 mg by mouth daily.   atorvastatin 20 MG tablet Commonly known as: LIPITOR Take 20 mg by mouth daily.   Cholecalciferol 25 MCG (1000 UT) tablet Take 1,000 Units by mouth daily.   Eye Drops 0.05 % ophthalmic solution Generic drug: tetrahydrozoline Place 1 drop into both eyes daily as needed (burning eye).   glimepiride 2 MG tablet Commonly known as: Amaryl Take 1 tablet (2 mg total) by mouth daily with breakfast.   glucose  blood test strip Test blood glucose once daily. One Touch Verio test strips   GOLD BOND MEDICATED BODY EX Apply 1 application topically daily.   Lancet Device Misc Check blood glucose once daily. One TCH 01B Delicapl LNC   lipase/protease/amylase 36000 UNITS Cpep capsule Commonly known as: Creon Take 2 capsules (72,000 Units total) by mouth 3 (three) times daily before meals. What changed: when to take this   loperamide 2 MG tablet Commonly known as: Imodium A-D Take 2 at onset of diarrhea, then 1 every 2hrs until 12hr without a BM. May take 2 tab every 4hrs at bedtime. If diarrhea recurs repeat. What changed:   how much to take  how to take this  when to take this  reasons to take this   metFORMIN 500 MG tablet Commonly known as: GLUCOPHAGE Take 1 tablet (500 mg total) by mouth 2 (two) times daily with a meal.   metoprolol succinate 25 MG 24 hr tablet Commonly known as: TOPROL-XL Take 1 tablet (25 mg total) by mouth daily. Take with or immediately following a meal.   multivitamin with minerals tablet Take 1 tablet by mouth daily.   olmesartan 5 MG tablet Commonly known as: BENICAR Take 1 tablet by mouth once daily  pantoprazole 40 MG tablet Commonly known as: PROTONIX Take 1 tablet (40 mg total) by mouth 2 (two) times daily.       Allergies:  Allergies  Allergen Reactions  . Lisinopril Swelling    Facial and upper lip    Past Medical History, Surgical history, Social history, and Family History were reviewed and updated.  Review of Systems: Review of Systems  Constitutional: Negative.   HENT: Negative.   Eyes: Negative.   Respiratory: Negative.   Cardiovascular: Negative.   Gastrointestinal: Negative.   Genitourinary: Negative.   Musculoskeletal: Negative.   Skin: Negative.   Neurological: Negative.   Endo/Heme/Allergies: Negative.   Psychiatric/Behavioral: Negative.       Physical Exam:  weight is 216 lb (98 kg). Her oral temperature is  98.1 F (36.7 C). Her blood pressure is 155/80 (abnormal) and her pulse is 80. Her respiration is 17 and oxygen saturation is 100%.   Wt Readings from Last 3 Encounters:  03/30/20 216 lb (98 kg)  02/10/20 215 lb (97.5 kg)  01/25/20 211 lb 9.6 oz (96 kg)    Physical Exam Vitals reviewed.  HENT:     Head: Normocephalic and atraumatic.  Eyes:     Pupils: Pupils are equal, round, and reactive to light.  Cardiovascular:     Rate and Rhythm: Normal rate and regular rhythm.     Heart sounds: Normal heart sounds.  Pulmonary:     Effort: Pulmonary effort is normal.     Breath sounds: Normal breath sounds.  Abdominal:     General: Bowel sounds are normal.     Palpations: Abdomen is soft.  Musculoskeletal:        General: No tenderness or deformity. Normal range of motion.     Cervical back: Normal range of motion.     Comments: Extremities shows decreased range of motion of the left shoulder.  There is decreased abduction.  She has very little rotation of the left shoulder.  Lymphadenopathy:     Cervical: No cervical adenopathy.  Skin:    General: Skin is warm and dry.     Findings: No erythema or rash.  Neurological:     Mental Status: She is alert and oriented to person, place, and time.  Psychiatric:        Behavior: Behavior normal.        Thought Content: Thought content normal.        Judgment: Judgment normal.      Lab Results  Component Value Date   WBC 10.8 (H) 03/30/2020   HGB 12.5 03/30/2020   HCT 38.0 03/30/2020   MCV 84.8 03/30/2020   PLT 279 03/30/2020   No results found for: FERRITIN, IRON, TIBC, UIBC, IRONPCTSAT Lab Results  Component Value Date   RBC 4.48 03/30/2020   No results found for: KPAFRELGTCHN, LAMBDASER, KAPLAMBRATIO No results found for: IGGSERUM, IGA, IGMSERUM No results found for: Ronnald Ramp, A1GS, A2GS, Violet Baldy, MSPIKE, SPEI   Chemistry      Component Value Date/Time   NA 139 03/30/2020 1440   K 3.6  03/30/2020 1440   CL 100 03/30/2020 1440   CO2 31 03/30/2020 1440   BUN 16 03/30/2020 1440   CREATININE 0.67 03/30/2020 1440   CREATININE 0.78 03/23/2018 1018      Component Value Date/Time   CALCIUM 10.0 03/30/2020 1440   ALKPHOS 73 03/30/2020 1440   AST 18 03/30/2020 1440   ALT 16 03/30/2020 1440   ALT 40 (H) 11/25/2017  6438   BILITOT 0.3 03/30/2020 1440       Impression and Plan: Melanie Alvarez is a very pleasant 64 yo African American female with adenocarcinoma of the pancreas, ampullary carcinoma.  I am happy that she is doing well right now.  Again, her biggest problem is the shoulder.  She will see Dr. Tamera Punt of [facility] on Friday the 13th.  We will plan for a follow-up CT scan on her when I see her back.  I would like to think that everything is going to be okay.  Unfortunate, we really cannot monitor any progression with tumor markers.  She comes in with her daughter.  As always, we have good fellowship which is was nice to have.  I will plan to see her back in October.  Volanda Napoleon, MD 8/12/20213:33 PM

## 2020-03-31 LAB — LACTATE DEHYDROGENASE: LDH: 196 U/L — ABNORMAL HIGH (ref 98–192)

## 2020-03-31 LAB — CANCER ANTIGEN 19-9: CA 19-9: <2 U/mL (ref 0–35)

## 2020-04-02 ENCOUNTER — Other Ambulatory Visit: Payer: Self-pay | Admitting: Nurse Practitioner

## 2020-04-02 DIAGNOSIS — E119 Type 2 diabetes mellitus without complications: Secondary | ICD-10-CM

## 2020-04-05 ENCOUNTER — Other Ambulatory Visit: Payer: Self-pay

## 2020-05-18 ENCOUNTER — Other Ambulatory Visit: Payer: BC Managed Care – PPO

## 2020-05-18 ENCOUNTER — Ambulatory Visit: Payer: BC Managed Care – PPO | Admitting: Hematology & Oncology

## 2020-05-25 ENCOUNTER — Inpatient Hospital Stay: Payer: BC Managed Care – PPO | Attending: Hematology & Oncology

## 2020-05-25 ENCOUNTER — Ambulatory Visit (HOSPITAL_BASED_OUTPATIENT_CLINIC_OR_DEPARTMENT_OTHER)
Admission: RE | Admit: 2020-05-25 | Discharge: 2020-05-25 | Disposition: A | Discharge status: Home / Self Care | Payer: BC Managed Care – PPO | Source: Ambulatory Visit | Attending: Hematology & Oncology | Admitting: Hematology & Oncology

## 2020-05-25 ENCOUNTER — Encounter: Payer: Self-pay | Admitting: Hematology & Oncology

## 2020-05-25 ENCOUNTER — Inpatient Hospital Stay: Payer: BC Managed Care – PPO

## 2020-05-25 ENCOUNTER — Other Ambulatory Visit: Payer: Self-pay

## 2020-05-25 ENCOUNTER — Inpatient Hospital Stay (HOSPITAL_BASED_OUTPATIENT_CLINIC_OR_DEPARTMENT_OTHER): Payer: BC Managed Care – PPO | Admitting: Hematology & Oncology

## 2020-05-25 ENCOUNTER — Encounter (HOSPITAL_BASED_OUTPATIENT_CLINIC_OR_DEPARTMENT_OTHER): Payer: Self-pay

## 2020-05-25 VITALS — BP 114/74 | HR 87 | Temp 98.9°F | Resp 17

## 2020-05-25 VITALS — BP 142/71 | HR 72 | Temp 98.0°F | Resp 19 | Wt 219.0 lb

## 2020-05-25 DIAGNOSIS — C250 Malignant neoplasm of head of pancreas: Secondary | ICD-10-CM

## 2020-05-25 DIAGNOSIS — C241 Malignant neoplasm of ampulla of Vater: Secondary | ICD-10-CM | POA: Diagnosis not present

## 2020-05-25 DIAGNOSIS — Z452 Encounter for adjustment and management of vascular access device: Secondary | ICD-10-CM | POA: Diagnosis not present

## 2020-05-25 DIAGNOSIS — Z95828 Presence of other vascular implants and grafts: Secondary | ICD-10-CM

## 2020-05-25 HISTORY — DX: Malignant (primary) neoplasm, unspecified: C80.1

## 2020-05-25 LAB — CMP (CANCER CENTER ONLY)
ALT: 16 U/L (ref 0–44)
AST: 15 U/L (ref 15–41)
Albumin: 3.9 g/dL (ref 3.5–5.0)
Alkaline Phosphatase: 78 U/L (ref 38–126)
Anion gap: 9 (ref 5–15)
BUN: 9 mg/dL (ref 8–23)
CO2: 32 mmol/L (ref 22–32)
Calcium: 9.8 mg/dL (ref 8.9–10.3)
Chloride: 93 mmol/L — ABNORMAL LOW (ref 98–111)
Creatinine: 0.68 mg/dL (ref 0.44–1.00)
GFR, Estimated: >60 mL/min (ref >60)
Glucose, Bld: 241 mg/dL — ABNORMAL HIGH (ref 70–99)
Potassium: 3.6 mmol/L (ref 3.5–5.1)
Sodium: 134 mmol/L — ABNORMAL LOW (ref 135–145)
Total Bilirubin: 0.4 mg/dL (ref 0.3–1.2)
Total Protein: 7.1 g/dL (ref 6.5–8.1)

## 2020-05-25 LAB — CBC WITH DIFFERENTIAL (CANCER CENTER ONLY)
Abs Immature Granulocytes: 0.04 10*3/uL (ref 0.00–0.07)
Basophils Absolute: 0.1 10*3/uL (ref 0.0–0.1)
Basophils Relative: 1 %
Eosinophils Absolute: 0.2 10*3/uL (ref 0.0–0.5)
Eosinophils Relative: 2 %
HCT: 37.9 % (ref 36.0–46.0)
Hemoglobin: 12.6 g/dL (ref 12.0–15.0)
Immature Granulocytes: 0 %
Lymphocytes Relative: 16 %
Lymphs Abs: 1.5 10*3/uL (ref 0.7–4.0)
MCH: 27.4 pg (ref 26.0–34.0)
MCHC: 33.2 g/dL (ref 30.0–36.0)
MCV: 82.4 fL (ref 80.0–100.0)
Monocytes Absolute: 0.4 10*3/uL (ref 0.1–1.0)
Monocytes Relative: 4 %
Neutro Abs: 7.3 10*3/uL (ref 1.7–7.7)
Neutrophils Relative %: 77 %
Platelet Count: 299 10*3/uL (ref 150–400)
RBC: 4.6 MIL/uL (ref 3.87–5.11)
RDW: 12.5 % (ref 11.5–15.5)
WBC Count: 9.5 10*3/uL (ref 4.0–10.5)
nRBC: 0 % (ref 0.0–0.2)

## 2020-05-25 IMAGING — CT CT CHEST-ABD-PELV W/ CM
3 of 5 series · 15 of 36 positions shown, 17 images · IV contrast (omnipaque)
Comparison: [DATE]

CLINICAL DATA: Follow-up pancreatic cancer, status post XRT

EXAM:
CT CHEST, ABDOMEN, AND PELVIS WITH CONTRAST
TECHNIQUE: Multidetector CT imaging of the chest, abdomen and pelvis was
performed following the standard protocol during bolus
administration of intravenous contrast.
CONTRAST:  100mL OMNIPAQUE IOHEXOL 300 MG/ML SOLN, additional oral
enteric contrast

[Series 2: cap with 2 · axial · 0.90mm/px · z∈[-565,-90]mm · 10 of 117 slices shown, 12 images]
[im 11/117  mediastinal]
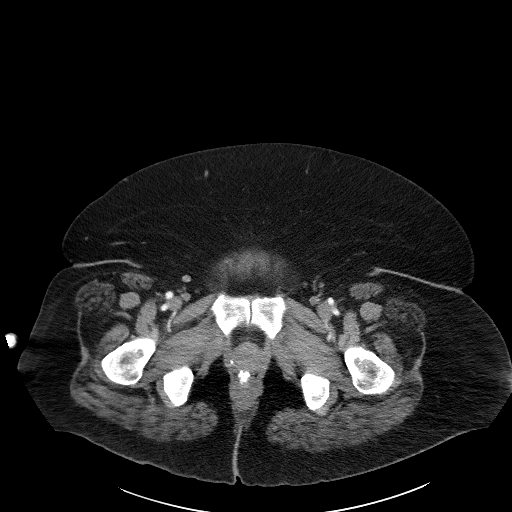
[im 11/117  bone]
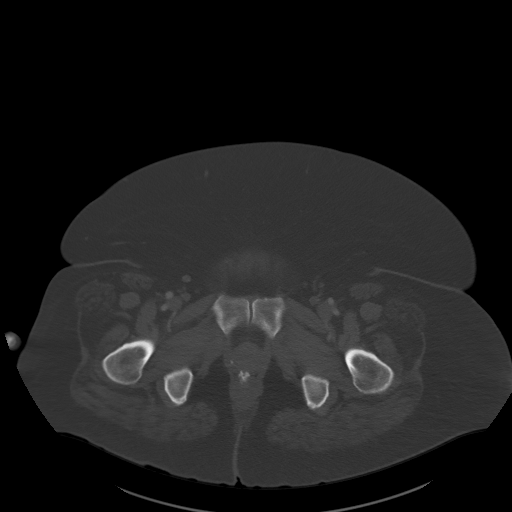
[im 22/117  mediastinal]
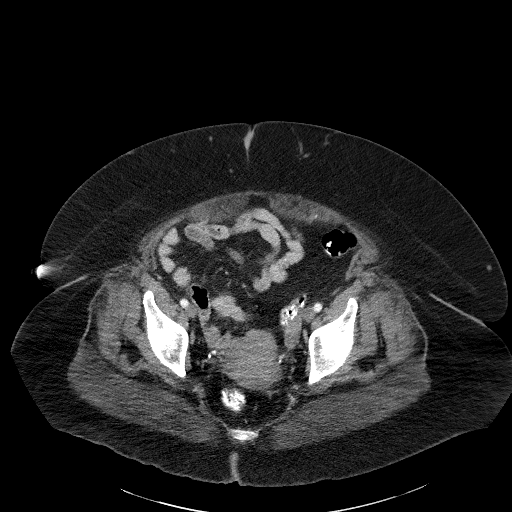
[im 32/117  mediastinal]
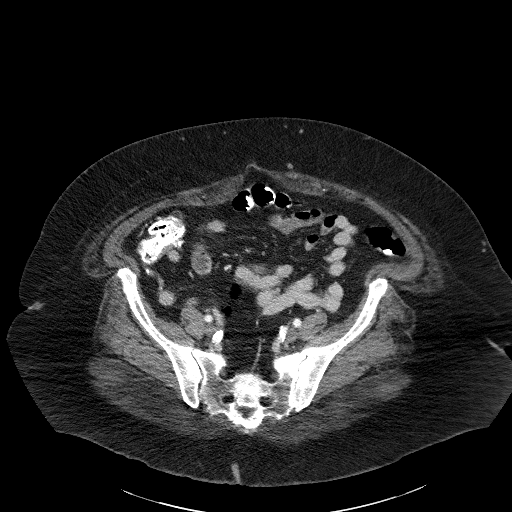
[im 43/117  mediastinal]
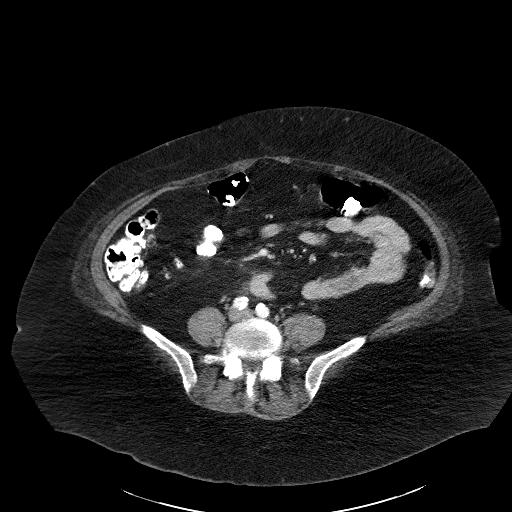
[im 53/117  mediastinal]
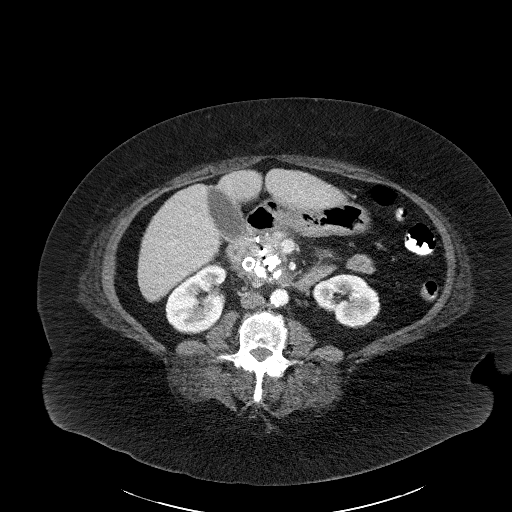
[im 64/117  mediastinal]
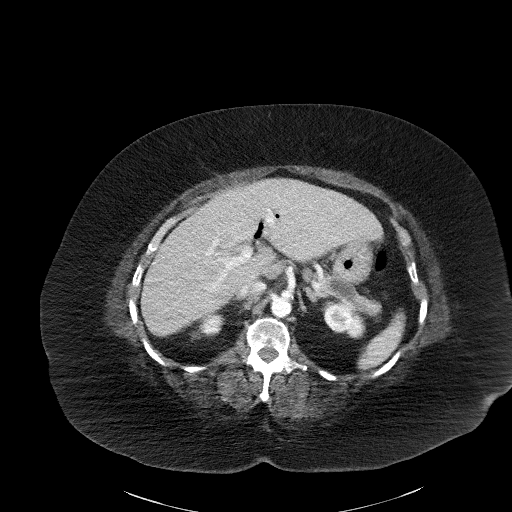
[im 74/117  mediastinal]
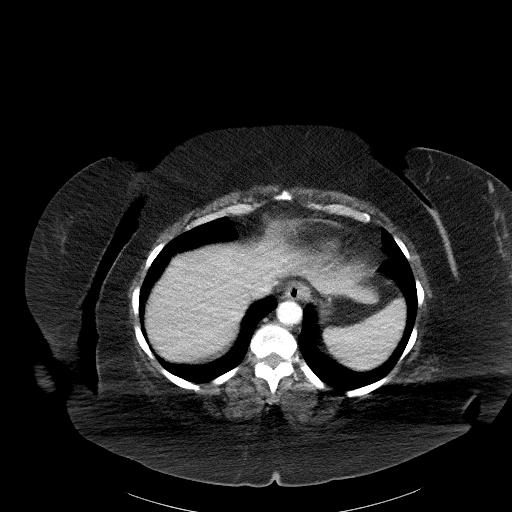
[im 85/117  mediastinal]
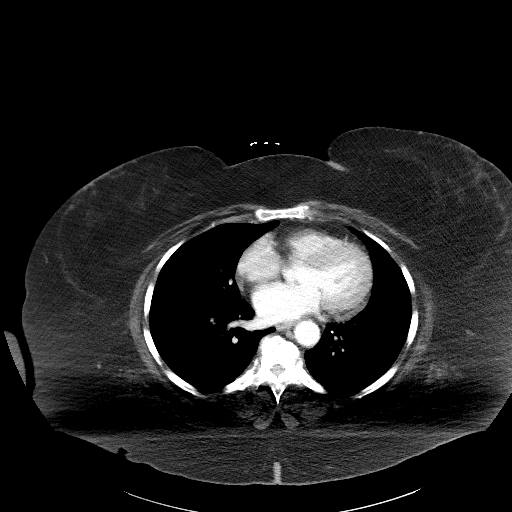
[im 95/117  mediastinal]
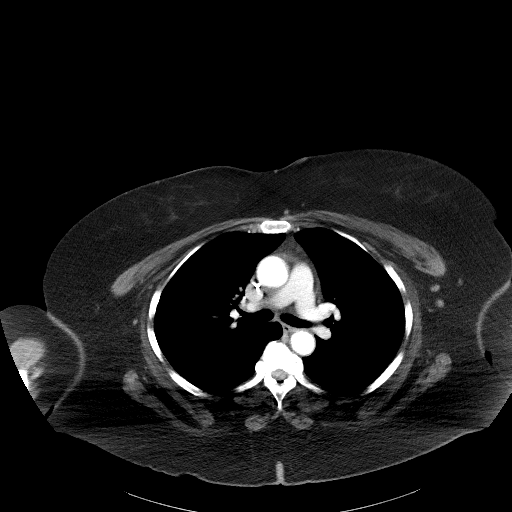
[im 95/117  bone]
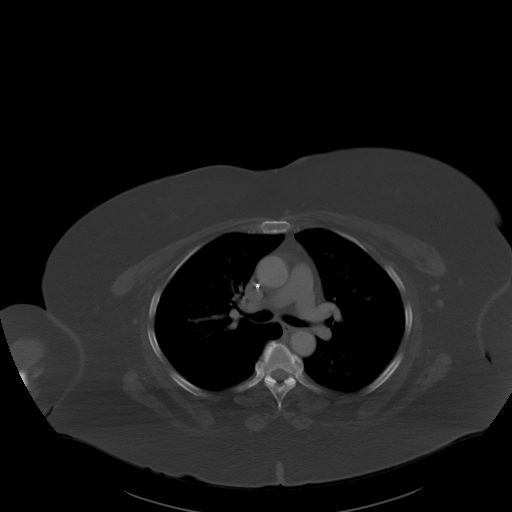
[im 106/117  mediastinal]
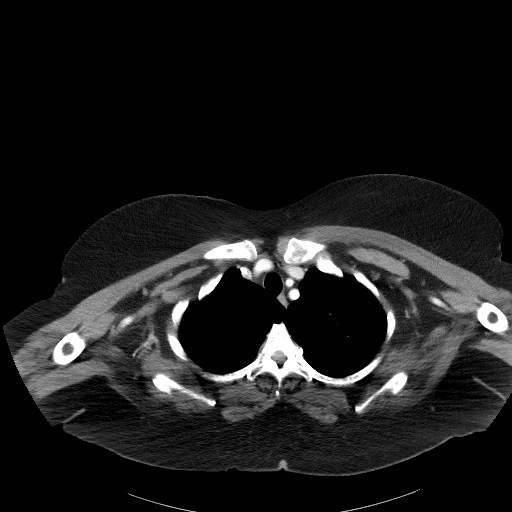

[Series 3: lung · axial · 0.69mm/px · z∈[-263,-221]mm · 2 of 125 slices shown]
[im 11/125  bone]
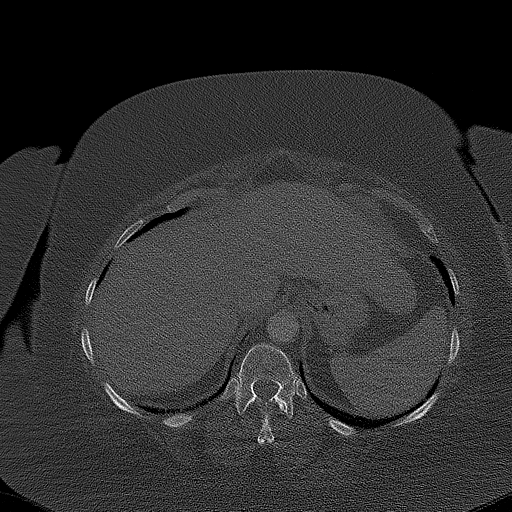
[im 32/125  bone]
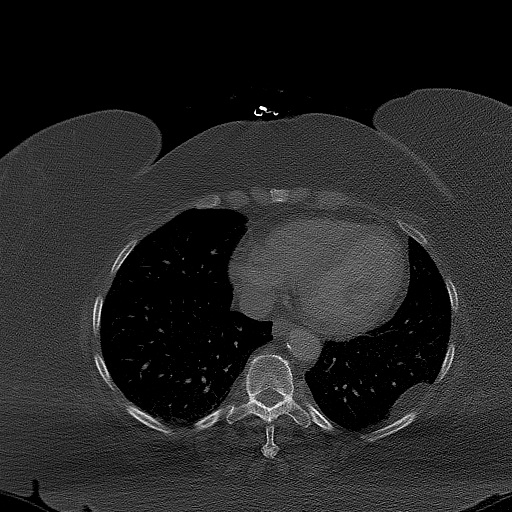

[Series 5: coronals · coronal · 1.02mm/px · 3 of 176 slices shown]
[im 36/176  mediastinal]
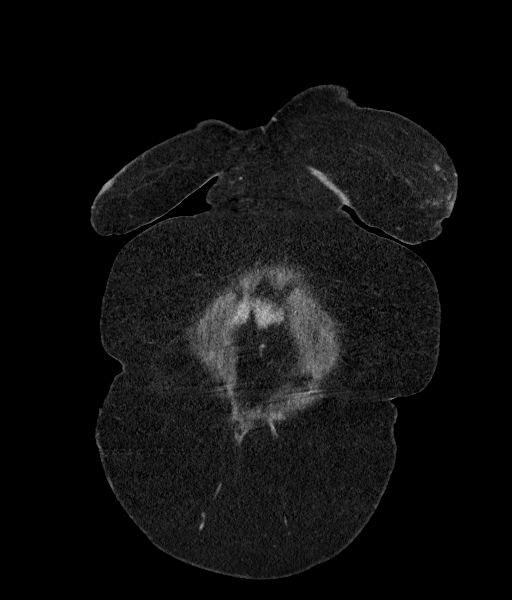
[im 71/176  mediastinal]
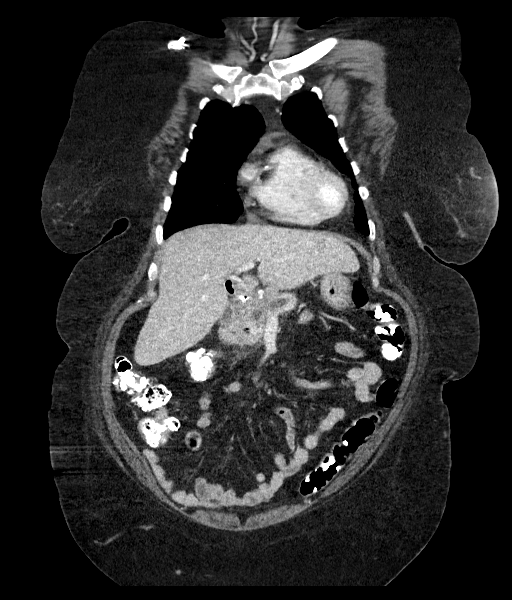
[im 106/176  mediastinal]
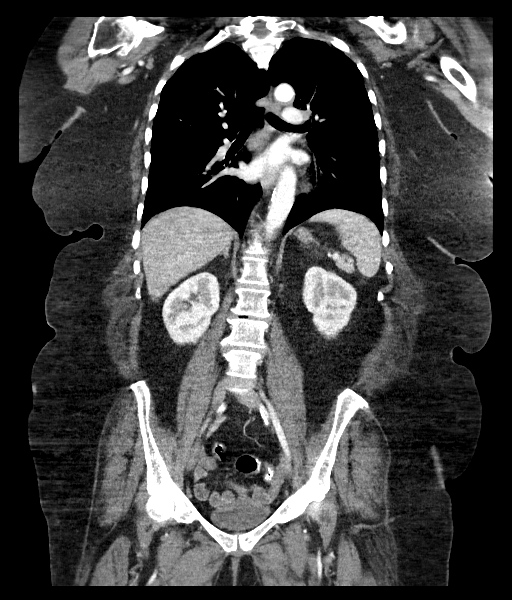

[15 of 36 positions shown; findings below may reference images not displayed]

FINDINGS: CT CHEST FINDINGS

Cardiovascular: Right chest port catheter. Aortic atherosclerosis.
Normal heart size. No pericardial effusion.

Mediastinum/Nodes: Unchanged prominent, although not pathologically
enlarged bilateral axillary lymph nodes. Unchanged prominent
epicardial lymph node measuring 0.9 cm (series 2, image 43). No
other enlarged mediastinal lymph nodes. Thyroid gland, trachea, and
esophagus demonstrate no significant findings.

Lungs/Pleura: Lungs are clear. Unchanged subpleural lipoma of the
left lung base (series 2, image 39). No pleural effusion or
pneumothorax.

Musculoskeletal: No chest wall mass or suspicious bone lesions
identified.

CT ABDOMEN PELVIS FINDINGS

Hepatobiliary: No solid liver abnormality is seen. No gallstones,
gallbladder wall thickening, or biliary dilatation. Post stenting
pneumobilia.

Pancreas: Common bile duct stent is again noted. Unchanged
appearance of the pancreatic head with a heterogeneously calcified
hypodense mass measuring approximately 3.1 x 3.0 cm, not
significantly changed (series 2, image 64). There is dilatation of
the main pancreatic duct up to 1.2 cm.

Spleen: Normal in size without significant abnormality.

Adrenals/Urinary Tract: Adrenal glands are unremarkable. Unchanged
exophytic mixed solid and cystic mass of the inferior pole of the
right kidney measuring 1.0 cm (series 2, image 73). Bladder is
unremarkable.

Stomach/Bowel: Stomach is within normal limits. Appendix appears
normal. No evidence of bowel wall thickening, distention, or
inflammatory changes.

Vascular/Lymphatic: Aortic atherosclerosis. Unchanged prominent
subcentimeter retroperitoneal and portacaval lymph nodes (series 2,
image 56).

Reproductive: No mass or other abnormality.

Other: No abdominal wall hernia or abnormality. No abdominopelvic
ascites.

Musculoskeletal: No acute or significant osseous findings.
IMPRESSION: 1. Unchanged post treatment appearance appearance of the pancreatic
head with a heterogeneously calcified hypodense mass.
2. Unchanged pancreatic ductal dilatation.
3. Common bile duct stent remains in position. Post stenting
pneumobilia.
4. Unchanged prominent subcentimeter retroperitoneal and portacaval
lymph nodes as well as a prominent epicardial lymph node,
nonspecific. Attention on follow-up.
5. No evidence of metastatic disease in the chest.
6. Aortic Atherosclerosis ([11]-[11]).

## 2020-05-25 MED ORDER — SODIUM CHLORIDE 0.9% FLUSH
10.0000 mL | Freq: Once | INTRAVENOUS | Status: DC
  Administered 2020-05-25: 10 mL via INTRAVENOUS
  Filled 2020-05-25: qty 10

## 2020-05-25 MED ORDER — IOHEXOL 300 MG/ML  SOLN
100.0000 mL | Freq: Once | INTRAMUSCULAR | Status: AC | PRN
  Administered 2020-05-25: 100 mL via INTRAVENOUS

## 2020-05-25 MED ORDER — HEPARIN SOD (PORK) LOCK FLUSH 100 UNIT/ML IV SOLN
500.0000 [IU] | Freq: Once | INTRAVENOUS | Status: DC
  Administered 2020-05-25: 500 [IU] via INTRAVENOUS
  Filled 2020-05-25: qty 5

## 2020-05-25 MED ORDER — SODIUM CHLORIDE 0.9% FLUSH
10.0000 mL | Freq: Once | INTRAVENOUS | Status: DC
  Filled 2020-05-25: qty 10

## 2020-05-25 NOTE — Progress Notes (Signed)
Hematology and Oncology Follow Up Visit  Melanie Alvarez [id number] November 26, 1955 64 y.o. 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  She really looks good. Hopefully, she will have surgery for her shoulder. I will go ahead and call her orthopedic surgeon.  She had a CT scan done today. The CT scan did not show any evidence of recurrent disease or progressive disease. She has a stable mass in the pancreas which measures 3 x 3.1 cm. This is calcified.  She has had some occasional abdominal discomfort. This appears to be over on the left side. I will know if this is scar tissue. I will know if this might be some diverticulosis.  She wants to have the Port-A-Cath taken out. We will see about having this done.  She has had no problems with appetite. Her blood sugars are quite high. She really does not watch what she eats.  She has had no issues with fever. She has had no change in bowel or bladder habits. She has had no leg swelling.  Overall, her performance status right now is ECOG 0.    Medications:  Allergies as of 05/25/2020      Reactions   Lisinopril Swelling   Facial and upper lip      Medication List       Accurate as of May 25, 2020 12:06 PM. If you have any questions, ask your nurse or doctor.        amLODipine 10 MG tablet Commonly known as: NORVASC Take 1 tablet (10 mg total) by mouth at bedtime.   aspirin EC 81 MG tablet Take 81 mg by mouth daily.   atorvastatin 20 MG tablet Commonly known as: LIPITOR Take 20 mg by mouth daily.   Cholecalciferol 25 MCG (1000 UT) tablet Take 1,000 Units by mouth daily.   Eye Drops 0.05 % ophthalmic solution Generic drug: tetrahydrozoline Place 1 drop into both eyes  daily as needed (burning eye).   glimepiride 2 MG tablet Commonly known as: Amaryl Take 1 tablet (2 mg total) by mouth daily with breakfast.   glucose blood test strip Test blood glucose once daily. One Touch Verio test strips   GOLD BOND MEDICATED BODY EX Apply 1 application topically daily.   Lancet Device Misc Check blood glucose once daily. One TCH 00X Delicapl LNC   lipase/protease/amylase 36000 UNITS Cpep capsule Commonly known as: Creon Take 2 capsules (72,000 Units total) by mouth 3 (three) times daily before meals. What changed: when to take this   loperamide 2 MG tablet Commonly known as: Imodium A-D Take 2 at onset of diarrhea, then 1 every 2hrs until 12hr without a BM. May take 2 tab every 4hrs at bedtime. If diarrhea recurs repeat. What changed:   how much to take  how to take this  when to take this  reasons to take this   metFORMIN 500 MG tablet Commonly known as: GLUCOPHAGE Take 1 tablet (500 mg total) by mouth 2 (two) times daily with a meal. Need office visit for additional refills   metoprolol succinate 25 MG 24 hr tablet Commonly known as: TOPROL-XL Take 1 tablet (25 mg total) by mouth daily. Take with or immediately following a meal.   multivitamin  with minerals tablet Take 1 tablet by mouth daily.   olmesartan 5 MG tablet Commonly known as: BENICAR Take 1 tablet by mouth once daily   pantoprazole 40 MG tablet Commonly known as: PROTONIX Take 1 tablet (40 mg total) by mouth 2 (two) times daily.       Allergies:  Allergies  Allergen Reactions  . Lisinopril Swelling    Facial and upper lip    Past Medical History, Surgical history, Social history, and Family History were reviewed and updated.  Review of Systems: Review of Systems  Constitutional: Negative.   HENT: Negative.   Eyes: Negative.   Respiratory: Negative.   Cardiovascular: Negative.   Gastrointestinal: Negative.   Genitourinary: Negative.   Musculoskeletal:  Negative.   Skin: Negative.   Neurological: Negative.   Endo/Heme/Allergies: Negative.   Psychiatric/Behavioral: Negative.       Physical Exam:  weight is 219 lb (99.3 kg). Her oral temperature is 98 F (36.7 C). Her blood pressure is 142/71 (abnormal) and her pulse is 72. Her respiration is 19 and oxygen saturation is 98%.   Wt Readings from Last 3 Encounters:  05/25/20 219 lb (99.3 kg)  03/30/20 216 lb (98 kg)  02/10/20 215 lb (97.5 kg)    Physical Exam Vitals reviewed.  HENT:     Head: Normocephalic and atraumatic.  Eyes:     Pupils: Pupils are equal, round, and reactive to light.  Cardiovascular:     Rate and Rhythm: Normal rate and regular rhythm.     Heart sounds: Normal heart sounds.  Pulmonary:     Effort: Pulmonary effort is normal.     Breath sounds: Normal breath sounds.  Abdominal:     General: Bowel sounds are normal.     Palpations: Abdomen is soft.  Musculoskeletal:        General: No tenderness or deformity. Normal range of motion.     Cervical back: Normal range of motion.     Comments: Extremities shows decreased range of motion of the left shoulder.  There is decreased abduction.  She has very little rotation of the left shoulder.  Lymphadenopathy:     Cervical: No cervical adenopathy.  Skin:    General: Skin is warm and dry.     Findings: No erythema or rash.  Neurological:     Mental Status: She is alert and oriented to person, place, and time.  Psychiatric:        Behavior: Behavior normal.        Thought Content: Thought content normal.        Judgment: Judgment normal.      Lab Results  Component Value Date   WBC 9.5 05/25/2020   HGB 12.6 05/25/2020   HCT 37.9 05/25/2020   MCV 82.4 05/25/2020   PLT 299 05/25/2020   No results found for: FERRITIN, IRON, TIBC, UIBC, IRONPCTSAT Lab Results  Component Value Date   RBC 4.60 05/25/2020   No results found for: KPAFRELGTCHN, LAMBDASER, KAPLAMBRATIO No results found for: IGGSERUM, IGA,  IGMSERUM No results found for: Kathrynn Ducking, MSPIKE, SPEI   Chemistry      Component Value Date/Time   NA 134 (L) 05/25/2020 0953   K 3.6 05/25/2020 0953   CL 93 (L) 05/25/2020 0953   CO2 32 05/25/2020 0953   BUN 9 05/25/2020 0953   CREATININE 0.68 05/25/2020 0953   CREATININE 0.78 03/23/2018 1018      Component Value Date/Time   CALCIUM  9.8 05/25/2020 0953   ALKPHOS 78 05/25/2020 0953   AST 15 05/25/2020 0953   ALT 16 05/25/2020 0953   ALT 40 (H) 11/25/2017 0947   BILITOT 0.4 05/25/2020 0953       Impression and Plan: Melanie Alvarez is a very pleasant 64 yo African American female with adenocarcinoma of the pancreas, ampullary carcinoma.  I am happy that she is doing well right now. Everything looks fine with respect to the pancreatic cancer. I realize that there is always at risk of recurrence.  I think we can now get her back in 3 months. We will do a CT scan when we see her back.  Hopefully, she will have shoulder surgery in October. If she has it in October, then by the Christmas holiday, her shoulder should be more mobile.  We will have the Port-A-Cath removed.   Volanda Napoleon, MD 10/7/202112:06 PM

## 2020-05-25 NOTE — Patient Instructions (Signed)

## 2020-05-25 NOTE — Addendum Note (Signed)
Addended by: Amelia Jo I on: 05/25/2020 12:18 PM   Modules accepted: Orders

## 2020-05-26 LAB — CANCER ANTIGEN 19-9: CA 19-9: <2 U/mL (ref 0–35)

## 2020-06-11 ENCOUNTER — Other Ambulatory Visit: Payer: Self-pay | Admitting: Nurse Practitioner

## 2020-06-11 DIAGNOSIS — I10 Essential (primary) hypertension: Secondary | ICD-10-CM

## 2020-06-12 NOTE — Telephone Encounter (Signed)
Last fill for Lipitor 11/13/19  Historical provider Last fill for Metoprolol 2020/Dr. Deborra Medina Please see message and advise.  Thank you.

## 2020-06-15 ENCOUNTER — Other Ambulatory Visit: Payer: Self-pay | Admitting: Radiology

## 2020-06-15 ENCOUNTER — Other Ambulatory Visit: Payer: Self-pay | Admitting: Student

## 2020-06-16 ENCOUNTER — Ambulatory Visit (HOSPITAL_COMMUNITY)
Admission: RE | Admit: 2020-06-16 | Discharge: 2020-06-16 | Disposition: A | Discharge status: Home / Self Care | Payer: BC Managed Care – PPO | Source: Ambulatory Visit | Attending: Hematology & Oncology | Admitting: Hematology & Oncology

## 2020-06-16 ENCOUNTER — Encounter (HOSPITAL_COMMUNITY): Payer: Self-pay

## 2020-06-16 ENCOUNTER — Other Ambulatory Visit: Payer: Self-pay

## 2020-06-16 DIAGNOSIS — C250 Malignant neoplasm of head of pancreas: Secondary | ICD-10-CM

## 2020-06-16 DIAGNOSIS — Z87891 Personal history of nicotine dependence: Secondary | ICD-10-CM | POA: Diagnosis not present

## 2020-06-16 DIAGNOSIS — Z452 Encounter for adjustment and management of vascular access device: Secondary | ICD-10-CM | POA: Diagnosis not present

## 2020-06-16 DIAGNOSIS — Z8507 Personal history of malignant neoplasm of pancreas: Secondary | ICD-10-CM | POA: Diagnosis not present

## 2020-06-16 DIAGNOSIS — Z9221 Personal history of antineoplastic chemotherapy: Secondary | ICD-10-CM | POA: Insufficient documentation

## 2020-06-16 HISTORY — PX: IR REMOVAL TUN ACCESS W/ PORT W/O FL MOD SED: IMG2290

## 2020-06-16 LAB — GLUCOSE, CAPILLARY: Glucose-Capillary: 214 mg/dL — ABNORMAL HIGH (ref 70–99)

## 2020-06-16 MED ORDER — LIDOCAINE HCL (PF) 1 % IJ SOLN
INTRAMUSCULAR | Status: AC | PRN
  Administered 2020-06-16: 10 mL via INTRADERMAL

## 2020-06-16 MED ORDER — CEFAZOLIN SODIUM-DEXTROSE 2-4 GM/100ML-% IV SOLN
INTRAVENOUS | Status: AC
  Administered 2020-06-16: 2 g via INTRAVENOUS
  Filled 2020-06-16: qty 100

## 2020-06-16 MED ORDER — MIDAZOLAM HCL 2 MG/2ML IJ SOLN
INTRAMUSCULAR | Status: AC | PRN
  Administered 2020-06-16 (×2): 1 mg via INTRAVENOUS

## 2020-06-16 MED ORDER — LIDOCAINE HCL 1 % IJ SOLN
INTRAMUSCULAR | Status: AC
  Filled 2020-06-16: qty 20

## 2020-06-16 MED ORDER — SODIUM CHLORIDE 0.9 % IV SOLN: INTRAVENOUS | Status: DC

## 2020-06-16 MED ORDER — MIDAZOLAM HCL 2 MG/2ML IJ SOLN
INTRAMUSCULAR | Status: AC
  Filled 2020-06-16: qty 2

## 2020-06-16 MED ORDER — FENTANYL CITRATE (PF) 100 MCG/2ML IJ SOLN
INTRAMUSCULAR | Status: AC
  Filled 2020-06-16: qty 2

## 2020-06-16 MED ORDER — CEFAZOLIN SODIUM-DEXTROSE 2-4 GM/100ML-% IV SOLN: 2.0000 g | INTRAVENOUS | Status: AC

## 2020-06-16 MED ORDER — FENTANYL CITRATE (PF) 100 MCG/2ML IJ SOLN
INTRAMUSCULAR | Status: AC | PRN
  Administered 2020-06-16 (×2): 50 ug via INTRAVENOUS

## 2020-06-16 NOTE — Consult Note (Signed)
Chief Complaint: Completed chemotherapy. Request is for portacath removal.   Referring Physician(s): Ennever,Peter R  Supervising Physician: Aletta Edouard  Patient Status: Melanie Alvarez - Out-pt  History of Present Illness: Melanie Alvarez is a 64 y.o. female 64 y.o. female outpatient. History of  metastatic pancreatic cancer s/p XRT. IR placed a RIJ portacath on 2.5.21. [facility] Team is requesting portacath removal no longer needed   CT CAP from 10.7.21 shows RIJ portcath. All labs and medications are within acceptable parameters. No pertinent allergies. Patient has been NPO since midnight.    Past Medical History:  Diagnosis Date  . Cancer ([place])   . Cirrhosis ([place])   . Diabetes mellitus 2012  . Diverticulosis   . Gallbladder sludge   . Hepatitis 2010   history of Hepatitis C  . Hepatitis C   . Hypertension 2011  . Obesity     Past Surgical History:  Procedure Laterality Date  . BILIARY BRUSHING  09/22/2019   Procedure: BILIARY BRUSHING;  Surgeon: Rush Landmark Telford Nab., MD;  Location: [facility];  Service: Gastroenterology;;  . BILIARY DILATION  09/22/2019   Procedure: BILIARY DILATION;  Surgeon: Irving Copas., MD;  Location: [facility];  Service: Gastroenterology;;  . BILIARY STENT PLACEMENT N/A 09/22/2019   Procedure: BILIARY STENT PLACEMENT;  Surgeon: Irving Copas., MD;  Location: [facility];  Service: Gastroenterology;  Laterality: N/A;  . COLONOSCOPY  07/24/2011   Procedure: COLONOSCOPY;  Surgeon: Landry Dyke, MD;  Location: [facility];  Service: Endoscopy;  Laterality: N/A;  . Dental Surgery Tooth Extraction    . ERCP N/A 09/22/2019   Procedure: ENDOSCOPIC RETROGRADE CHOLANGIOPANCREATOGRAPHY (ERCP);  Surgeon: Irving Copas., MD;  Location: [facility];  Service: Gastroenterology;  Laterality: N/A;  . ESOPHAGOGASTRODUODENOSCOPY (EGD) WITH PROPOFOL N/A 09/22/2019   Procedure: ESOPHAGOGASTRODUODENOSCOPY (EGD) WITH PROPOFOL;  Surgeon:  Rush Landmark Telford Nab., MD;  Location: [facility];  Service: Gastroenterology;  Laterality: N/A;  . ESOPHAGOGASTRODUODENOSCOPY (EGD) WITH PROPOFOL N/A 09/25/2019   Procedure: ESOPHAGOGASTRODUODENOSCOPY (EGD) WITH PROPOFOL;  Surgeon: Rush Landmark Telford Nab., MD;  Location: [facility];  Service: Gastroenterology;  Laterality: N/A;  . ESOPHAGOGASTRODUODENOSCOPY (EGD) WITH PROPOFOL N/A 01/19/2020   Procedure: ESOPHAGOGASTRODUODENOSCOPY (EGD) WITH PROPOFOL;  Surgeon: Rush Landmark Telford Nab., MD;  Location: [place];  Service: Gastroenterology;  Laterality: N/A;  . EUS N/A 09/22/2019   Procedure: UPPER ENDOSCOPIC ULTRASOUND (EUS) RADIAL;  Surgeon: Rush Landmark Telford Nab., MD;  Location: [facility];  Service: Gastroenterology;  Laterality: N/A;  . EUS N/A 01/19/2020   Procedure: UPPER ENDOSCOPIC ULTRASOUND (EUS) RADIAL;  Surgeon: Rush Landmark Telford Nab., MD;  Location: [place];  Service: Gastroenterology;  Laterality: N/A;  . FIDUCIAL MARKER PLACEMENT N/A 01/19/2020   Procedure: FIDUCIAL MARKER PLACEMENT;  Surgeon: Irving Copas., MD;  Location: [place];  Service: Gastroenterology;  Laterality: N/A;  . FINE NEEDLE ASPIRATION  09/22/2019   Procedure: FINE NEEDLE ASPIRATION (FNA) LINEAR;  Surgeon: Irving Copas., MD;  Location: [facility];  Service: Gastroenterology;;  . FINE NEEDLE ASPIRATION  09/25/2019   Procedure: FINE NEEDLE ASPIRATION (FNA) RADIAL;  Surgeon: Irving Copas., MD;  Location: [facility];  Service: Gastroenterology;;  . HEMOSTASIS CLIP PLACEMENT  09/25/2019   Procedure: HEMOSTASIS CLIP PLACEMENT;  Surgeon: Irving Copas., MD;  Location: [facility];  Service: Gastroenterology;;  . IR IMAGING GUIDED PORT INSERTION  09/24/2019  . SPHINCTEROTOMY  09/22/2019   Procedure: SPHINCTEROTOMY;  Surgeon: Mansouraty, Telford Nab., MD;  Location: [facility];  Service: Gastroenterology;;  . UPPER ESOPHAGEAL ENDOSCOPIC ULTRASOUND (EUS) N/A  09/25/2019    Procedure: UPPER ESOPHAGEAL ENDOSCOPIC ULTRASOUND (EUS);  Surgeon: Irving Copas., MD;  Location: [facility];  Service: Gastroenterology;  Laterality: N/A;    Allergies: Lisinopril  Medications: Prior to Admission medications   Medication Sig Start Date End Date Taking? Authorizing Provider  amLODipine (NORVASC) 10 MG tablet Take 1 tablet (10 mg total) by mouth at bedtime. 05/26/19  Yes Nche, Charlene Brooke, NP  aspirin EC 81 MG tablet Take 81 mg by mouth daily.   Yes [provider]  atorvastatin (LIPITOR) 20 MG tablet Take 1 tablet (20 mg total) by mouth daily. Need office visit for additional refills 06/12/20  Yes Nche, Charlene Brooke, NP  Cholecalciferol 25 MCG (1000 UT) tablet Take 1,000 Units by mouth daily.    Yes [provider]  Emollient (GOLD BOND MEDICATED BODY EX) Apply 1 application topically daily.   Yes [provider]  glucose blood test strip Test blood glucose once daily. One Touch Verio test strips 09/28/19  Yes Nche, Charlene Brooke, NP  Lancet Device MISC Check blood glucose once daily. One TCH 67E Delicapl LNC 02/18/08  Yes Nche, Charlene Brooke, NP  lipase/protease/amylase (CREON) 36000 UNITS CPEP capsule Take 2 capsules (72,000 Units total) by mouth 3 (three) times daily before meals. Patient taking differently: Take 72,000 Units by mouth daily.  09/29/19  Yes Ennever, Rudell Cobb, MD  loperamide (IMODIUM A-D) 2 MG tablet Take 2 at onset of diarrhea, then 1 every 2hrs until 12hr without a BM. May take 2 tab every 4hrs at bedtime. If diarrhea recurs repeat. Patient taking differently: Take 2 mg by mouth daily as needed. Take 2 at onset of diarrhea, then 1 every 2hrs until 12hr without a BM. May take 2 tab every 4hrs at bedtime. If diarrhea recurs repeat. 10/12/19  Yes Volanda Napoleon, MD  metFORMIN (GLUCOPHAGE) 500 MG tablet Take 1 tablet (500 mg total) by mouth 2 (two) times daily with a meal. Need office visit for additional refills 04/03/20  Yes  Nche, Charlene Brooke, NP  metoprolol succinate (TOPROL-XL) 25 MG 24 hr tablet Take 1 tablet (25 mg total) by mouth daily. Need office visit for additional refills 06/12/20  Yes Nche, Charlene Brooke, NP  Multiple Vitamins-Minerals (MULTIVITAMIN WITH MINERALS) tablet Take 1 tablet by mouth daily.   Yes [provider]  olmesartan (BENICAR) 5 MG tablet Take 1 tablet by mouth once daily 01/31/20  Yes Nche, Charlene Brooke, NP  glimepiride (AMARYL) 2 MG tablet Take 1 tablet (2 mg total) by mouth daily with breakfast. 09/29/19   Volanda Napoleon, MD  pantoprazole (PROTONIX) 40 MG tablet Take 1 tablet (40 mg total) by mouth 2 (two) times daily. 09/26/19 11/19/19  Mariel Aloe, MD  tetrahydrozoline (EYE DROPS) 0.05 % ophthalmic solution Place 1 drop into both eyes daily as needed (burning eye).    [provider]     Family History  Problem Relation Age of Onset  . Diabetes Sister   . Cancer Maternal Grandmother        leukemia   . Diabetes Maternal Grandfather   . Diabetes Paternal Grandfather   . Diabetes Paternal Grandmother   . Hypertension Sister   . Hypertension Brother   . Anesthesia problems Neg Hx     Social History   Socioeconomic History  . Marital status: Married    Spouse name: Not on file  . Number of children: Not on file  . Years of education: Not on file  .  Highest education level: Not on file  Occupational History  . Not on file  Tobacco Use  . Smoking status: Former Smoker    Types: Cigarettes    Quit date: 09/20/2019    Years since quitting: 0.7  . Smokeless tobacco: Never Used  Vaping Use  . Vaping Use: Never used  Substance and Sexual Activity  . Alcohol use: Not Currently    Alcohol/week: 30.0 standard drinks    Types: 24 Cans of beer, 6 Shots of liquor per week    Comment: weekends  . Drug use: No    Comment: previously   . Sexual activity: Yes    Partners: Male    Birth control/protection: Post-menopausal  Other Topics Concern  . Not on  file  Social History Narrative   Previous [facility] pt.  Last MD was Dr. Delman Cheadle, seen 12/2011      Works part time in [employer] with husband   Social Determinants of Health   Financial Resource Strain:   . Difficulty of Paying Living Expenses: Not on file  Food Insecurity:   . Worried About Charity fundraiser in the Last Year: Not on file  . Ran Out of Food in the Last Year: Not on file  Transportation Needs:   . Lack of Transportation (Medical): Not on file  . Lack of Transportation (Non-Medical): Not on file  Physical Activity:   . Days of Exercise per Week: Not on file  . Minutes of Exercise per Session: Not on file  Stress:   . Feeling of Stress : Not on file  Social Connections:   . Frequency of Communication with Friends and Family: Not on file  . Frequency of Social Gatherings with Friends and Family: Not on file  . Attends Religious Services: Not on file  . Active Member of Clubs or Organizations: Not on file  . Attends Archivist Meetings: Not on file  . Marital Status: Not on file     Review of Systems: A 12 point ROS discussed and pertinent positives are indicated in the HPI above.  All other systems are negative.  Review of Systems  Constitutional: Negative for fatigue and fever.  HENT: Negative for congestion.   Respiratory: Negative for cough and shortness of breath.   Gastrointestinal: Negative for abdominal pain, diarrhea, nausea and vomiting.    Vital Signs: BP 127/77 (BP Location: Right Arm)   Pulse 94   Temp 98.6 F (37 C) (Oral)   Resp 18   Ht 5' 3.5" (1.613 m)   Wt 219 lb (99.3 kg)   SpO2 97%   BMI 38.19 kg/m   Physical Exam Vitals and nursing note reviewed.  Constitutional:      Appearance: She is well-developed.  HENT:     Head: Normocephalic and atraumatic.  Eyes:     Conjunctiva/sclera: Conjunctivae normal.  Cardiovascular:     Rate and Rhythm: Normal rate and regular rhythm.     Heart  sounds: Normal heart sounds.  Pulmonary:     Effort: Pulmonary effort is normal.     Breath sounds: Normal breath sounds.  Musculoskeletal:     Cervical back: Normal range of motion.  Neurological:     Mental Status: She is alert and oriented to person, place, and time.     Imaging: CT CHEST ABDOMEN PELVIS W CONTRAST  Result Date: 05/25/2020 CLINICAL DATA:  Follow-up pancreatic cancer, status post XRT EXAM: CT CHEST,  ABDOMEN, AND PELVIS WITH CONTRAST TECHNIQUE: Multidetector CT imaging of the chest, abdomen and pelvis was performed following the standard protocol during bolus administration of intravenous contrast. CONTRAST:  154mL OMNIPAQUE IOHEXOL 300 MG/ML SOLN, additional oral enteric contrast COMPARISON:  12/16/2019 FINDINGS: CT CHEST FINDINGS Cardiovascular: Right chest port catheter. Aortic atherosclerosis. Normal heart size. No pericardial effusion. Mediastinum/Nodes: Unchanged prominent, although not pathologically enlarged bilateral axillary lymph nodes. Unchanged prominent epicardial lymph node measuring 0.9 cm (series 2, image 43). No other enlarged mediastinal lymph nodes. Thyroid gland, trachea, and esophagus demonstrate no significant findings. Lungs/Pleura: Lungs are clear. 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 No pleural effusion or pneumothorax. Musculoskeletal: No chest wall mass or suspicious bone lesions identified. CT ABDOMEN PELVIS FINDINGS Hepatobiliary: No solid liver abnormality is seen. No gallstones, gallbladder wall thickening, or biliary dilatation. Post stenting pneumobilia. Pancreas: Common bile duct stent is again noted. Unchanged appearance of the pancreatic head with a heterogeneously calcified hypodense mass measuring approximately 3.1 x 3.0 cm, not significantly changed (series 2, image 64). There is dilatation of the main pancreatic duct up to 1.2 cm. Spleen: Normal in size without significant abnormality. Adrenals/Urinary Tract:  Adrenal glands are unremarkable. Unchanged exophytic mixed solid and cystic mass of the inferior pole of the right kidney measuring 1.0 cm (series 2, image 73). Bladder is unremarkable. Stomach/Bowel: Stomach is within normal limits. Appendix appears normal. No evidence of bowel wall thickening, distention, or inflammatory changes. Vascular/Lymphatic: Aortic atherosclerosis. Unchanged prominent subcentimeter retroperitoneal and portacaval lymph nodes (series 2, image 56). Reproductive: No mass or other abnormality. Other: No abdominal wall hernia or abnormality. No abdominopelvic ascites. Musculoskeletal: No acute or significant osseous findings. IMPRESSION: 1. Unchanged post treatment appearance appearance of the pancreatic head with a heterogeneously calcified hypodense mass. 2. Unchanged pancreatic ductal dilatation. 3. Common bile duct stent remains in position. Post stenting pneumobilia. 4. Unchanged prominent subcentimeter retroperitoneal and portacaval lymph nodes as well as a prominent epicardial lymph node, nonspecific. Attention on follow-up. 5. No evidence of metastatic disease in the chest. 6. Aortic Atherosclerosis (ICD10-I70.0). Electronically Signed   By: Eddie Candle M.D.   On: 05/25/2020 11:22    Labs:  CBC: Recent Labs    12/16/19 1021 02/10/20 0926 03/30/20 1440 05/25/20 0953  WBC 10.2 10.5 10.8* 9.5  HGB 11.9* 12.3 12.5 12.6  HCT 37.0 38.9 38.0 37.9  PLT 322 335 279 299    COAGS: Recent Labs    09/20/19 1124  INR 1.3*    BMP: Recent Labs    11/24/19 0833 11/24/19 0833 12/16/19 1021 02/10/20 0926 03/30/20 1440 05/25/20 0953  NA 140   < > 138 140 139 134*  K 3.5   < > 3.4* 3.7 3.6 3.6  CL 102   < > 101 102 100 93*  CO2 31   < > 28 31 31  32  GLUCOSE 93   < > 104* 153* 72 241*  BUN 11   < > 10 13 16 9   CALCIUM 9.5   < > 9.8 10.0 10.0 9.8  CREATININE 0.56   < > 0.53 0.62 0.67 0.68  GFRNONAA >60   < > >60 >60 >60 >60  GFRAA >60  --  >60 >60 >60  --    < > =  values in this interval not displayed.    LIVER FUNCTION TESTS: Recent Labs    12/16/19 1021 02/10/20 0926 03/30/20 1440 05/25/20 0953  BILITOT 0.4 0.3 0.3 0.4  AST  26 17 18 15   ALT 25 19 16 16   ALKPHOS 53 64 73 78  PROT 7.2 7.5 7.2 7.1  ALBUMIN 3.9 4.1 4.1 3.9     Assessment and Plan:  64 y.o. female outpatient. History of  metastatic pancreatic cancer s/p XRT. IR placed a RIJ portacath on 2.5.21.Patient has completed chemotherapy. Team is requesting portacath removal no longer needed   CT CAP from 10.7.21 shows RIJ portcath. All labs and medications are within acceptable parameters. No pertinent allergies. Patient has been NPO since midnight.      Thank you for this interesting consult.  I greatly enjoyed meeting KANYLA OMEARA and look forward to participating in their care.  A copy of this report was sent to the requesting provider on this date.  Electronically Signed: Jacqualine Mau, NP 06/16/2020, 8:47 AM   I spent a total of  30 Minutes   in face to face in clinical consultation, greater than 50% of which was counseling/coordinating care for portacath removal

## 2020-06-16 NOTE — Procedures (Signed)
Interventional Radiology Procedure Note  Procedure: Port removal  Complications: None  Estimated Blood Loss: < 10 mL  Findings: Right chest port removed in entirety. Wound closed.  Yeslin Delio T. Martice Doty, M.D Pager:  [phone]    

## 2020-06-16 NOTE — Discharge Instructions (Signed)
Please call Interventional Radiology clinic [phone] with any questions or concerns.  You may remove your dressing and shower tomorrow.   Implanted Port Removal, Care After This sheet gives you information about how to care for yourself after your procedure. Your health care provider may also give you more specific instructions. If you have problems or questions, contact your health care provider. What can I expect after the procedure? After the procedure, it is common to have:  Soreness or pain near your incision.  Some swelling or bruising near your incision. Follow these instructions at home: Medicines  Take over-the-counter and prescription medicines only as told by your health care provider.  If you were prescribed an antibiotic medicine, take it as told by your health care provider. Do not stop taking the antibiotic even if you start to feel better. Bathing  Do not take baths, swim, or use a hot tub until your health care provider approves. Ask your health care provider if you can take showers. You may only be allowed to take sponge baths. Incision care   Follow instructions from your health care provider about how to take care of your incision. Make sure you: ? Wash your hands with soap and water before you change your bandage (dressing). If soap and water are not available, use hand sanitizer. ? Change your dressing as told by your health care provider. ? Keep your dressing dry. ? Leave stitches (sutures), skin glue, or adhesive strips in place. These skin closures may need to stay in place for 2 weeks or longer. If adhesive strip edges start to loosen and curl up, you may trim the loose edges. Do not remove adhesive strips completely unless your health care provider tells you to do that.  Check your incision area every day for signs of infection. Check for: ? More redness, swelling, or pain. ? More fluid or blood. ? Warmth. ? Pus or a bad smell. Driving   Do not  drive for 24 hours if you were given a medicine to help you relax (sedative) during your procedure.  If you did not receive a sedative, ask your health care provider when it is safe to drive. Activity  Return to your normal activities as told by your health care provider. Ask your health care provider what activities are safe for you.  Do not lift anything that is heavier than 10 lb (4.5 kg), or the limit that you are told, until your health care provider says that it is safe.  Do not do activities that involve lifting your arms over your head. General instructions  Do not use any products that contain nicotine or tobacco, such as cigarettes and e-cigarettes. These can delay healing. If you need help quitting, ask your health care provider.  Keep all follow-up visits as told by your health care provider. This is important. Contact a health care provider if:  You have more redness, swelling, or pain around your incision.  You have more fluid or blood coming from your incision.  Your incision feels warm to the touch.  You have pus or a bad smell coming from your incision.  You have pain that is not relieved by your pain medicine. Get help right away if you have:  A fever or chills.  Chest pain.  Difficulty breathing. Summary  After the procedure, it is common to have pain, soreness, swelling, or bruising near your incision.  If you were prescribed an antibiotic medicine, take it as told by your health  care provider. Do not stop taking the antibiotic even if you start to feel better.  Do not drive for 24 hours if you were given a sedative during your procedure.  Return to your normal activities as told by your health care provider. Ask your health care provider what activities are safe for you. This information is not intended to replace advice given to you by your health care provider. Make sure you discuss any questions you have with your health care provider. Document  Revised: 09/18/2017 Document Reviewed: 09/18/2017 Elsevier Patient Education  [place].   Moderate Conscious Sedation, Adult, Care After These instructions provide you with information about caring for yourself after your procedure. Your health care provider may also give you more specific instructions. Your treatment has been planned according to current medical practices, but problems sometimes occur. Call your health care provider if you have any problems or questions after your procedure. What can I expect after the procedure? After your procedure, it is common:  To feel sleepy for several hours.  To feel clumsy and have poor balance for several hours.  To have poor judgment for several hours.  To vomit if you eat too soon. Follow these instructions at home: For at least 24 hours after the procedure:   Do not: ? Participate in activities where you could fall or become injured. ? Drive. ? Use heavy machinery. ? Drink alcohol. ? Take sleeping pills or medicines that cause drowsiness. ? Make important decisions or sign legal documents. ? Take care of children on your own.  Rest. Eating and drinking  Follow the diet recommended by your health care provider.  If you vomit: ? Drink water, juice, or soup when you can drink without vomiting. ? Make sure you have little or no nausea before eating solid foods. General instructions  Have a responsible adult stay with you until you are awake and alert.  Take over-the-counter and prescription medicines only as told by your health care provider.  If you smoke, do not smoke without supervision.  Keep all follow-up visits as told by your health care provider. This is important. Contact a health care provider if:  You keep feeling nauseous or you keep vomiting.  You feel light-headed.  You develop a rash.  You have a fever. Get help right away if:  You have trouble breathing. This information is not intended to  replace advice given to you by your health care provider. Make sure you discuss any questions you have with your health care provider. Document Revised: 07/18/2017 Document Reviewed: 11/25/2015 Elsevier Patient Education  2020 [employer].

## 2020-07-03 ENCOUNTER — Other Ambulatory Visit: Payer: Self-pay | Admitting: Nurse Practitioner

## 2020-07-03 DIAGNOSIS — I10 Essential (primary) hypertension: Secondary | ICD-10-CM

## 2020-07-03 DIAGNOSIS — E119 Type 2 diabetes mellitus without complications: Secondary | ICD-10-CM

## 2020-07-03 NOTE — Telephone Encounter (Signed)
Last OV 09/20/19 Last fill for Aml 05/26/19  #90/3 Last fill for Met 04/03/20  #180/0

## 2020-07-07 ENCOUNTER — Other Ambulatory Visit: Payer: Self-pay | Admitting: Nurse Practitioner

## 2020-07-07 DIAGNOSIS — Z1231 Encounter for screening mammogram for malignant neoplasm of breast: Secondary | ICD-10-CM

## 2020-07-19 ENCOUNTER — Other Ambulatory Visit: Payer: BC Managed Care – PPO

## 2020-08-02 ENCOUNTER — Encounter: Payer: BC Managed Care – PPO | Admitting: Internal Medicine

## 2020-08-04 ENCOUNTER — Encounter: Payer: Self-pay | Admitting: *Deleted

## 2020-08-16 ENCOUNTER — Ambulatory Visit: Payer: BC Managed Care – PPO | Admitting: Nurse Practitioner

## 2020-08-21 ENCOUNTER — Ambulatory Visit: Payer: BC Managed Care – PPO | Admitting: Nurse Practitioner

## 2020-08-22 ENCOUNTER — Telehealth: Payer: Self-pay

## 2020-08-22 NOTE — Telephone Encounter (Signed)
Per inbasket message pt wishes to r/s her 08/25/20 appts, done and printed/mailed to pt, pt states will go by and p/u contrast...aom

## 2020-08-23 ENCOUNTER — Ambulatory Visit: Payer: BC Managed Care – PPO

## 2020-08-25 ENCOUNTER — Inpatient Hospital Stay: Payer: BC Managed Care – PPO | Admitting: Hematology & Oncology

## 2020-08-25 ENCOUNTER — Ambulatory Visit (HOSPITAL_BASED_OUTPATIENT_CLINIC_OR_DEPARTMENT_OTHER): Payer: BC Managed Care – PPO

## 2020-08-25 ENCOUNTER — Inpatient Hospital Stay: Payer: BC Managed Care – PPO

## 2020-09-05 ENCOUNTER — Ambulatory Visit: Payer: BC Managed Care – PPO | Admitting: Nurse Practitioner

## 2020-09-07 ENCOUNTER — Telehealth: Payer: Self-pay

## 2020-09-07 NOTE — Telephone Encounter (Signed)
returned pts call to r.s her 1-24 appts to 2-21, appts have been moved and a vm has been left for radiology to r/s her scan     Melanie Alvarez

## 2020-09-08 ENCOUNTER — Other Ambulatory Visit: Payer: Self-pay | Admitting: Nurse Practitioner

## 2020-09-08 DIAGNOSIS — I10 Essential (primary) hypertension: Secondary | ICD-10-CM

## 2020-09-11 ENCOUNTER — Inpatient Hospital Stay: Payer: BC Managed Care – PPO | Admitting: Hematology & Oncology

## 2020-09-11 ENCOUNTER — Inpatient Hospital Stay: Payer: BC Managed Care – PPO

## 2020-09-11 ENCOUNTER — Ambulatory Visit (HOSPITAL_BASED_OUTPATIENT_CLINIC_OR_DEPARTMENT_OTHER): Payer: BC Managed Care – PPO

## 2020-09-11 ENCOUNTER — Other Ambulatory Visit: Payer: BC Managed Care – PPO

## 2020-09-14 NOTE — Telephone Encounter (Signed)
Please see message and advise.  Thank you. Last OV 09/20/19 Last fill for both meds 06/12/20  #90/0

## 2020-09-23 ENCOUNTER — Other Ambulatory Visit: Payer: Self-pay | Admitting: Nurse Practitioner

## 2020-09-26 NOTE — Telephone Encounter (Signed)
Patient has not been seen in the last year, office visit is needed before medication can be filled.

## 2020-09-30 ENCOUNTER — Other Ambulatory Visit: Payer: Self-pay | Admitting: Nurse Practitioner

## 2020-09-30 DIAGNOSIS — I10 Essential (primary) hypertension: Secondary | ICD-10-CM

## 2020-09-30 DIAGNOSIS — E119 Type 2 diabetes mellitus without complications: Secondary | ICD-10-CM

## 2020-10-05 ENCOUNTER — Telehealth: Payer: Self-pay | Admitting: Hematology & Oncology

## 2020-10-05 NOTE — Telephone Encounter (Signed)
Called and advised patient of appointment date/time changes 2/21

## 2020-10-06 ENCOUNTER — Other Ambulatory Visit (HOSPITAL_BASED_OUTPATIENT_CLINIC_OR_DEPARTMENT_OTHER): Payer: BC Managed Care – PPO

## 2020-10-06 ENCOUNTER — Ambulatory Visit: Payer: BC Managed Care – PPO | Admitting: Hematology & Oncology

## 2020-10-06 ENCOUNTER — Other Ambulatory Visit: Payer: BC Managed Care – PPO

## 2020-10-09 ENCOUNTER — Inpatient Hospital Stay: Payer: BC Managed Care – PPO | Admitting: Hematology & Oncology

## 2020-10-09 ENCOUNTER — Ambulatory Visit (HOSPITAL_BASED_OUTPATIENT_CLINIC_OR_DEPARTMENT_OTHER): Payer: BC Managed Care – PPO

## 2020-10-09 ENCOUNTER — Inpatient Hospital Stay: Payer: BC Managed Care – PPO

## 2020-10-09 ENCOUNTER — Other Ambulatory Visit (HOSPITAL_BASED_OUTPATIENT_CLINIC_OR_DEPARTMENT_OTHER): Payer: BC Managed Care – PPO

## 2020-10-12 ENCOUNTER — Inpatient Hospital Stay: Payer: BC Managed Care – PPO | Admitting: Hematology & Oncology

## 2020-10-12 ENCOUNTER — Other Ambulatory Visit: Payer: Self-pay

## 2020-10-12 ENCOUNTER — Encounter (HOSPITAL_BASED_OUTPATIENT_CLINIC_OR_DEPARTMENT_OTHER): Payer: Self-pay

## 2020-10-12 ENCOUNTER — Ambulatory Visit (HOSPITAL_BASED_OUTPATIENT_CLINIC_OR_DEPARTMENT_OTHER)
Admission: RE | Admit: 2020-10-12 | Discharge: 2020-10-12 | Disposition: A | Discharge status: Home / Self Care | Payer: BC Managed Care – PPO | Source: Ambulatory Visit | Attending: Hematology & Oncology | Admitting: Hematology & Oncology

## 2020-10-12 ENCOUNTER — Inpatient Hospital Stay: Payer: BC Managed Care – PPO | Attending: Hematology & Oncology

## 2020-10-12 ENCOUNTER — Encounter: Payer: Self-pay | Admitting: Hematology & Oncology

## 2020-10-12 VITALS — BP 162/74 | HR 81 | Temp 98.2°F | Resp 20 | Wt 218.0 lb

## 2020-10-12 DIAGNOSIS — N2889 Other specified disorders of kidney and ureter: Secondary | ICD-10-CM | POA: Insufficient documentation

## 2020-10-12 DIAGNOSIS — C250 Malignant neoplasm of head of pancreas: Secondary | ICD-10-CM

## 2020-10-12 DIAGNOSIS — Z79899 Other long term (current) drug therapy: Secondary | ICD-10-CM | POA: Diagnosis not present

## 2020-10-12 DIAGNOSIS — Z9221 Personal history of antineoplastic chemotherapy: Secondary | ICD-10-CM | POA: Diagnosis not present

## 2020-10-12 DIAGNOSIS — C259 Malignant neoplasm of pancreas, unspecified: Secondary | ICD-10-CM | POA: Diagnosis present

## 2020-10-12 DIAGNOSIS — Z7982 Long term (current) use of aspirin: Secondary | ICD-10-CM | POA: Diagnosis not present

## 2020-10-12 LAB — CMP (CANCER CENTER ONLY)
ALT: 20 U/L (ref 0–44)
AST: 22 U/L (ref 15–41)
Albumin: 4.6 g/dL (ref 3.5–5.0)
Alkaline Phosphatase: 77 U/L (ref 38–126)
Anion gap: 7 (ref 5–15)
BUN: 15 mg/dL (ref 8–23)
CO2: 34 mmol/L — ABNORMAL HIGH (ref 22–32)
Calcium: 10.6 mg/dL — ABNORMAL HIGH (ref 8.9–10.3)
Chloride: 98 mmol/L (ref 98–111)
Creatinine: 0.7 mg/dL (ref 0.44–1.00)
GFR, Estimated: >60 mL/min (ref >60)
Glucose, Bld: 132 mg/dL — ABNORMAL HIGH (ref 70–99)
Potassium: 3.5 mmol/L (ref 3.5–5.1)
Sodium: 139 mmol/L (ref 135–145)
Total Bilirubin: 0.4 mg/dL (ref 0.3–1.2)
Total Protein: 8.3 g/dL — ABNORMAL HIGH (ref 6.5–8.1)

## 2020-10-12 LAB — CBC WITH DIFFERENTIAL (CANCER CENTER ONLY)
Abs Immature Granulocytes: 0.04 10*3/uL (ref 0.00–0.07)
Basophils Absolute: 0.1 10*3/uL (ref 0.0–0.1)
Basophils Relative: 1 %
Eosinophils Absolute: 0.2 10*3/uL (ref 0.0–0.5)
Eosinophils Relative: 2 %
HCT: 44.3 % (ref 36.0–46.0)
Hemoglobin: 14.2 g/dL (ref 12.0–15.0)
Immature Granulocytes: 0 %
Lymphocytes Relative: 20 %
Lymphs Abs: 1.8 10*3/uL (ref 0.7–4.0)
MCH: 27.7 pg (ref 26.0–34.0)
MCHC: 32.1 g/dL (ref 30.0–36.0)
MCV: 86.5 fL (ref 80.0–100.0)
Monocytes Absolute: 0.5 10*3/uL (ref 0.1–1.0)
Monocytes Relative: 5 %
Neutro Abs: 6.6 10*3/uL (ref 1.7–7.7)
Neutrophils Relative %: 72 %
Platelet Count: 386 10*3/uL (ref 150–400)
RBC: 5.12 MIL/uL — ABNORMAL HIGH (ref 3.87–5.11)
RDW: 13.3 % (ref 11.5–15.5)
WBC Count: 9.2 10*3/uL (ref 4.0–10.5)
nRBC: 0 % (ref 0.0–0.2)

## 2020-10-12 LAB — LACTATE DEHYDROGENASE: LDH: 184 U/L (ref 98–192)

## 2020-10-12 IMAGING — CT CT CHEST-ABD-PELV W/ CM
2 of 5 series · 14 of 36 positions shown, 16 images · IV contrast (Omnipaque)
Comparison: [DATE]

CLINICAL DATA: Status post radiation therapy and chemotherapy for
pancreatic cancer. Evaluate treatment response.

EXAM:
CT CHEST, ABDOMEN, AND PELVIS WITH CONTRAST
TECHNIQUE: Multidetector CT imaging of the chest, abdomen and pelvis was
performed following the standard protocol during bolus
administration of intravenous contrast.
CONTRAST:  100mL OMNIPAQUE IOHEXOL 300 MG/ML  SOLN

[Series 2: cap with 2 · axial · 0.98mm/px · z∈[-583,-68]mm · 11 of 125 slices shown, 13 images]
[im 11/125  mediastinal]
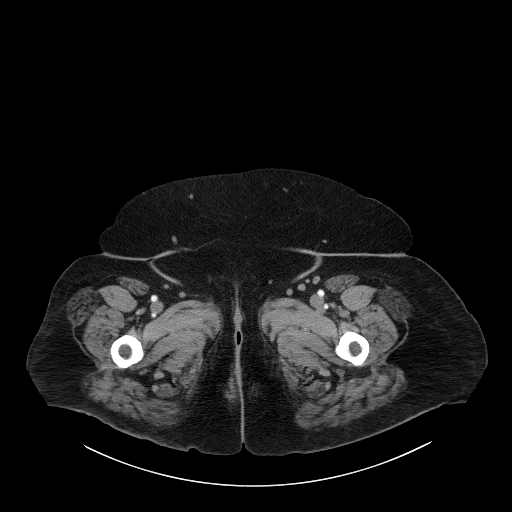
[im 11/125  bone]
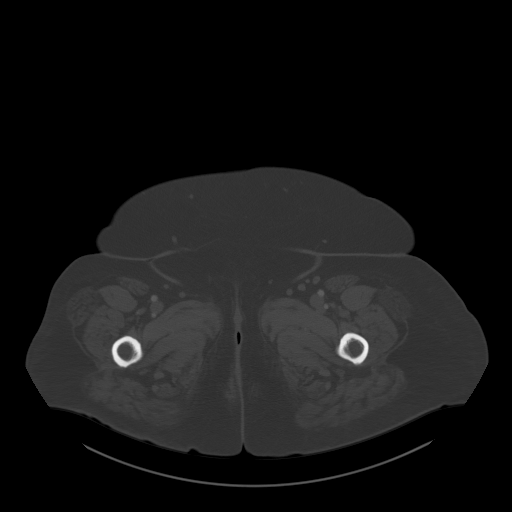
[im 21/125  mediastinal]
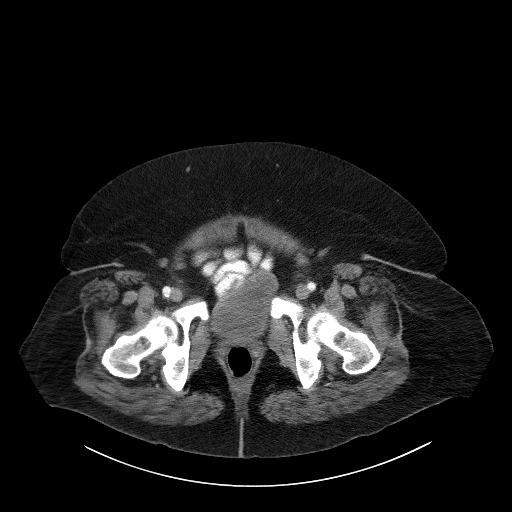
[im 32/125  mediastinal]
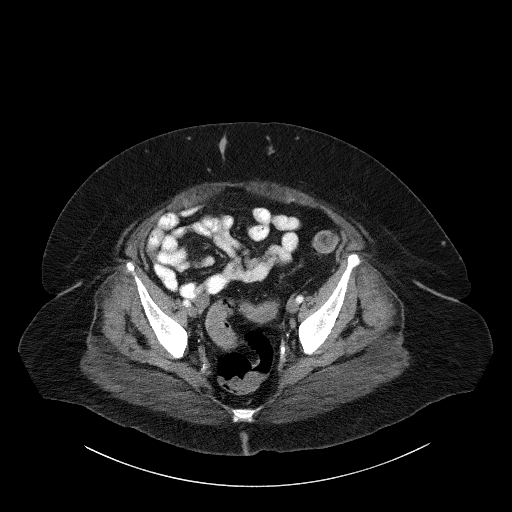
[im 42/125  mediastinal]
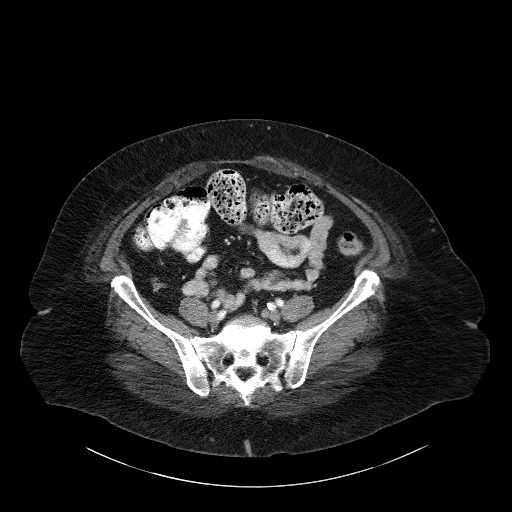
[im 52/125  mediastinal]
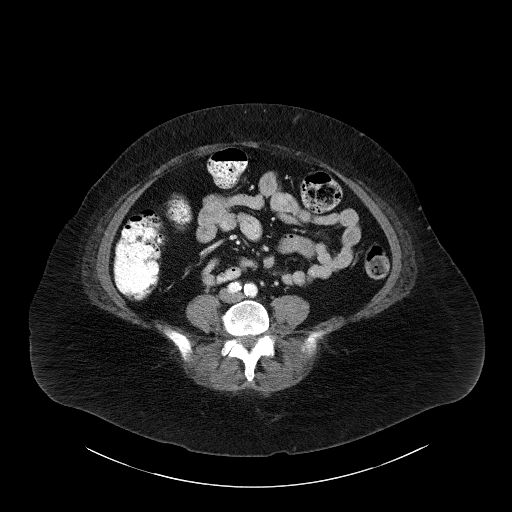
[im 63/125  mediastinal]
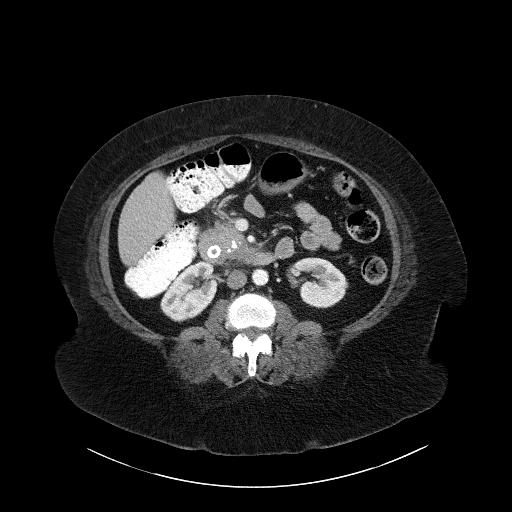
[im 73/125  mediastinal]
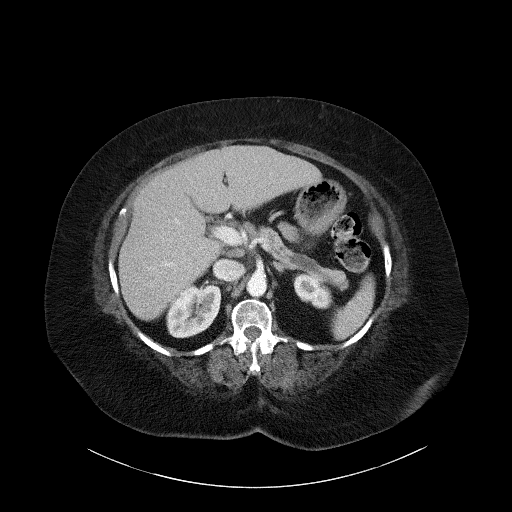
[im 83/125  mediastinal]
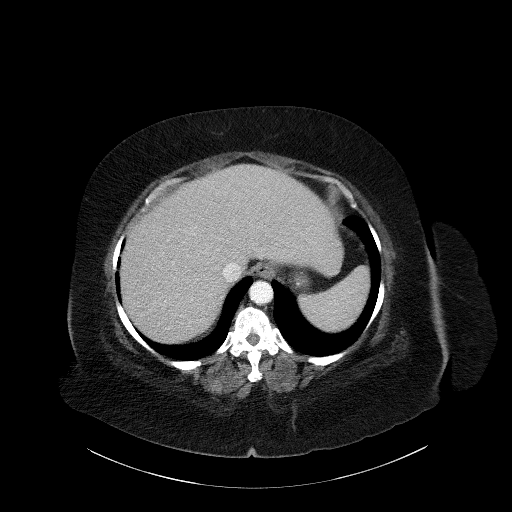
[im 94/125  mediastinal]
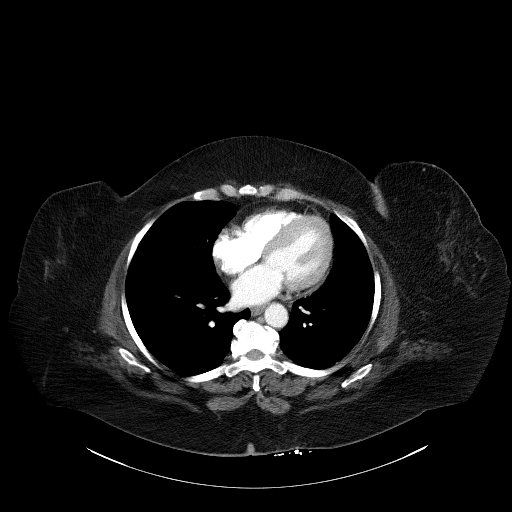
[im 94/125  bone]
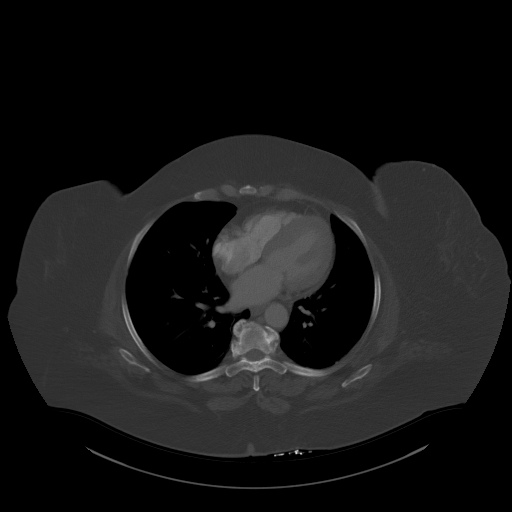
[im 104/125  mediastinal]
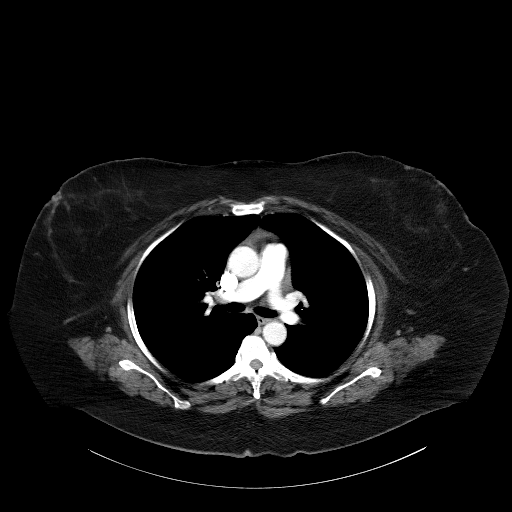
[im 114/125  mediastinal]
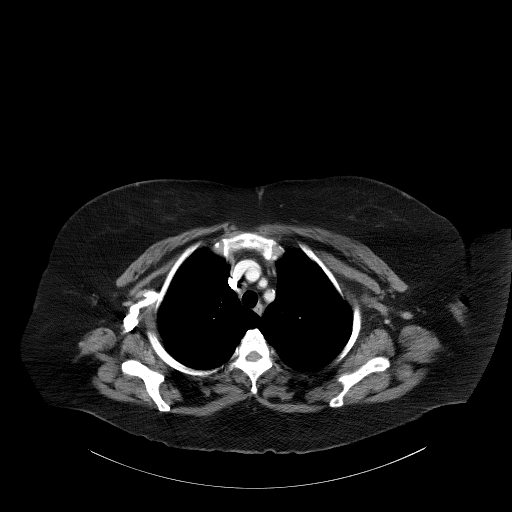

[Series 5: coronals · coronal · 0.91mm/px · 3 of 177 slices shown]
[im 36/177  mediastinal]
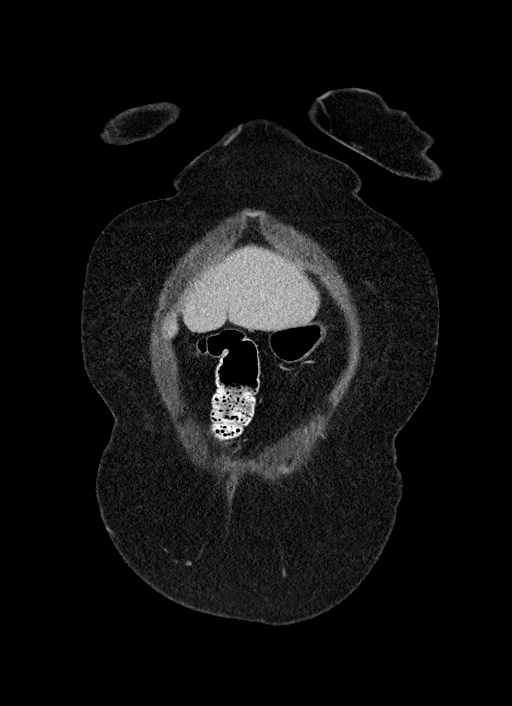
[im 71/177  mediastinal]
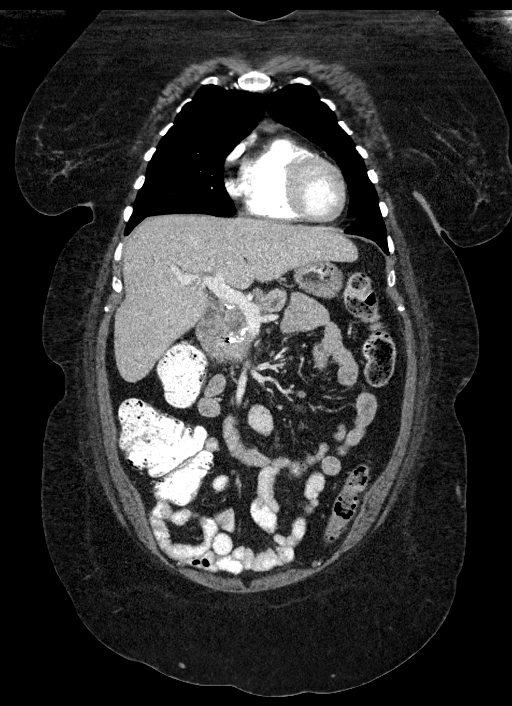
[im 106/177  mediastinal]
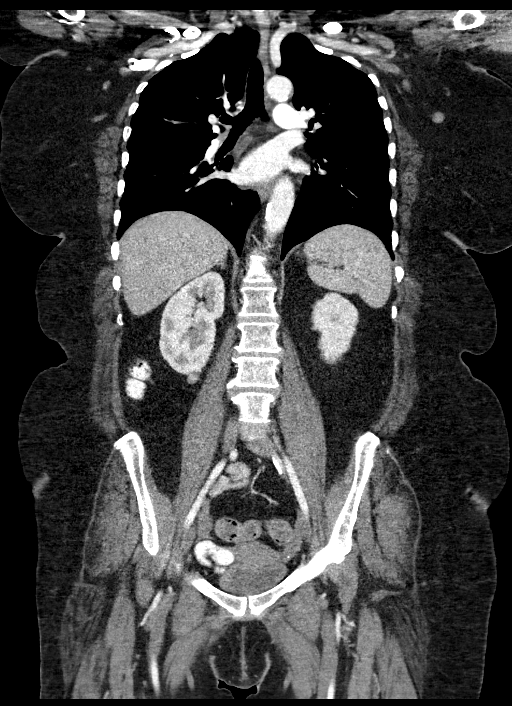

[14 of 36 positions shown; findings below may reference images not displayed]

FINDINGS: CT CHEST FINDINGS

Cardiovascular: Aortic atherosclerosis. Tortuous thoracic aorta.
Borderline cardiomegaly. Multivessel coronary artery
atherosclerosis.

Mediastinum/Nodes: Mildly prominent bilateral axillary nodes are
likely reactive and not significantly changed. No mediastinal or
hilar adenopathy.

Right cardiophrenic angle 5 mm node is similar to 6 mm on the prior
exam (when remeasured).

Lungs/Pleura: No pleural fluid. Left pleural fat prominence may
represent a lipoma and is unchanged including on 38/2. Clear lungs.

Musculoskeletal: No acute osseous abnormality.

CT ABDOMEN PELVIS FINDINGS

Hepatobiliary: Moderate cirrhosis, as evidenced by irregular hepatic
capsule, caudate lobe enlargement. No focal liver lesion.
Pneumobilia in the setting of a common duct stent which is similar
in position. Normal gallbladder.

Pancreas: Pancreatic atrophy and upstream duct dilatation again
identified. The pancreatic duct measures up to 9 mm within the
body/tail junction, similar. Up to 11 mm within the neck, similar.

Calcified pancreatic head mass is difficult to delineate from
surrounding parenchyma. Measures on the order of 3.9 x 3.2 cm on
60/2 versus 3.6 x 3.1 cm on the prior exam (when remeasured).

No superimposed acute pancreatitis.

Spleen: Normal in size, without focal abnormality.

Adrenals/Urinary Tract: Normal adrenal glands. Normal left kidney.
Lower pole right renal heterogeneous exophytic lesion measures 11 mm
on 68/2 and is similar to on the prior exam. No hydronephrosis.
Normal urinary bladder.

Stomach/Bowel: Normal stomach, without wall thickening. Mass-effect
upon the descending duodenum without gross encasement, similar.

Colonic stool burden suggests constipation. Normal terminal ileum
and appendix. Normal small bowel.

Vascular/Lymphatic: Aortic atherosclerosis. Multiple small abdominal
retroperitoneal nodes are similar. None are pathologic by size
criteria. BARD caval nodes of up to 7 mm are likely reactive in the
setting of cirrhosis. Up to 10 mm on the prior. No pelvic sidewall
adenopathy.

Reproductive: Normal uterus and adnexa.

Other: No significant free fluid. No evidence of omental or
peritoneal disease.

Musculoskeletal: No acute osseous abnormality.
IMPRESSION: 1. The pancreatic head primary is somewhat difficult to define
giving the extent of pancreatic calcifications. Favored to be
relatively similar versus mildly enlarged. Similar pancreatic
atrophy and upstream duct dilatation.
2. No evidence of metastatic disease within the chest, abdomen, or
pelvis.
3. Cirrhosis without portal venous hypertension.
4. 11 mm lower pole right renal lesion is similar, suspicious for
renal cell carcinoma.
5.  Possible constipation.
6. Coronary artery atherosclerosis. Aortic Atherosclerosis
([TR]-[TR]).

## 2020-10-12 MED ORDER — IOHEXOL 300 MG/ML  SOLN
100.0000 mL | Freq: Once | INTRAMUSCULAR | Status: AC | PRN
  Administered 2020-10-12: 100 mL via INTRAVENOUS

## 2020-10-13 LAB — CANCER ANTIGEN 19-9: CA 19-9: <2 U/mL (ref 0–35)

## 2020-10-13 NOTE — Progress Notes (Signed)
Hematology and Oncology Follow Up Visit  Melanie Alvarez [id number] 07/04/1956 65 y.o. 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  She is doing quite well.  She really has no specific complaints.  She is working.  Her right shoulder is not bothering her.  I am not sure as to what happened but she clearly has much better range of motion of the right shoulder.  We did do her CT scan done today.  This showed everything was stable with the pancreatic mass.  There is calcification with a pancreatic mass.  However, there is noted be an 11 mm lesion in the pole of the right kidney.  Again this is likely a renal cell carcinoma.  It is quite small.  I think we will have to get an MRI to assess this.  There is been no problems with her appetite.  She is eating fairly well.  She is having no nausea or vomiting.  There is no problems with urination.  There is no dysuria or hematuria.  She has had no constipation or diarrhea.  There is no bony pain.  She has had no leg swelling.  There are no rashes.  She is had no cough or shortness of breath.  There is been no bleeding.  She has a performance status of ECOG 0.    Medications:  Allergies as of 10/12/2020      Reactions   Lisinopril Swelling   Facial and upper lip      Medication List       Accurate as of October 12, 2020 11:59 PM. If you have any questions, ask your nurse or doctor.        STOP taking these medications   glimepiride 2 MG tablet Commonly known as: Amaryl Stopped by: Volanda Napoleon, MD     TAKE these medications   amLODipine 10 MG tablet Commonly known as: NORVASC TAKE 1 TABLET BY MOUTH AT BEDTIME   aspirin EC 81 MG tablet Take 81 mg by mouth daily.    atorvastatin 20 MG tablet Commonly known as: LIPITOR Take 1 tablet (20 mg total) by mouth daily. Need office visit for additional refills   Cholecalciferol 25 MCG (1000 UT) tablet Take 1,000 Units by mouth daily.   glucose blood test strip Test blood glucose once daily. One Touch Verio test strips   GOLD BOND MEDICATED BODY EX Apply 1 application topically daily.   Lancet Device Misc Check blood glucose once daily. One TCH 24Q Delicapl LNC   lipase/protease/amylase 36000 UNITS Cpep capsule Commonly known as: Creon Take 2 capsules (72,000 Units total) by mouth 3 (three) times daily before meals. What changed: when to take this   loperamide 2 MG tablet Commonly known as: Imodium A-D Take 2 at onset of diarrhea, then 1 every 2hrs until 12hr without a BM. May take 2 tab every 4hrs at bedtime. If diarrhea recurs repeat.   metFORMIN 500 MG tablet Commonly known as: GLUCOPHAGE TAKE 1 TABLET BY MOUTH TWICE DAILY WITH A MEAL **NEED  OFFICE  VISIT  FOR  ADDITIONAL  REFILLS  **   metoprolol succinate 25 MG 24 hr tablet Commonly known as: TOPROL-XL TAKE 1 TABLET BY MOUTH ONCE DAILY **NEED OFFICE  VISIT FOR ADDITIONAL REFILLS**   multivitamin with minerals tablet Take 1 tablet by mouth daily.   olmesartan 5 MG tablet Commonly known as: BENICAR Take 1 tablet by mouth once daily   pantoprazole 40 MG tablet Commonly known as: PROTONIX Take 1 tablet (40 mg total) by mouth 2 (two) times daily.   tetrahydrozoline 0.05 % ophthalmic solution Place 1 drop into both eyes daily as needed (burning eye).       Allergies:  Allergies  Allergen Reactions  . Lisinopril Swelling    Facial and upper lip    Past Medical History, Surgical history, Social history, and Family History were reviewed and updated.  Review of Systems: Review of Systems  Constitutional: Negative.   HENT: Negative.   Eyes: Negative.   Respiratory: Negative.   Cardiovascular: Negative.   Gastrointestinal:  Negative.   Genitourinary: Negative.   Musculoskeletal: Negative.   Skin: Negative.   Neurological: Negative.   Endo/Heme/Allergies: Negative.   Psychiatric/Behavioral: Negative.       Physical Exam:  weight is 218 lb (98.9 kg). Her oral temperature is 98.2 F (36.8 C). Her blood pressure is 162/74 (abnormal) and her pulse is 81. Her respiration is 20 and oxygen saturation is 100%.   Wt Readings from Last 3 Encounters:  10/12/20 218 lb (98.9 kg)  06/16/20 219 lb (99.3 kg)  05/25/20 219 lb (99.3 kg)    Physical Exam Vitals reviewed.  HENT:     Head: Normocephalic and atraumatic.  Eyes:     Pupils: Pupils are equal, round, and reactive to light.  Cardiovascular:     Rate and Rhythm: Normal rate and regular rhythm.     Heart sounds: Normal heart sounds.  Pulmonary:     Effort: Pulmonary effort is normal.     Breath sounds: Normal breath sounds.  Abdominal:     General: Bowel sounds are normal.     Palpations: Abdomen is soft.  Musculoskeletal:        General: No tenderness or deformity. Normal range of motion.     Cervical back: Normal range of motion.     Comments: Extremities shows decreased range of motion of the left shoulder.  There is decreased abduction.  She has very little rotation of the left shoulder.  Lymphadenopathy:     Cervical: No cervical adenopathy.  Skin:    General: Skin is warm and dry.     Findings: No erythema or rash.  Neurological:     Mental Status: She is alert and oriented to person, place, and time.  Psychiatric:        Behavior: Behavior normal.        Thought Content: Thought content normal.        Judgment: Judgment normal.      Lab Results  Component Value Date   WBC 9.2 10/12/2020   HGB 14.2 10/12/2020   HCT 44.3 10/12/2020   MCV 86.5 10/12/2020   PLT 386 10/12/2020   No results found for: FERRITIN, IRON, TIBC, UIBC, IRONPCTSAT Lab Results  Component Value Date   RBC 5.12 (H) 10/12/2020   No results found for:  KPAFRELGTCHN, LAMBDASER, KAPLAMBRATIO No results found for: IGGSERUM, IGA, IGMSERUM No results found for: TOTALPROTELP, ALBUMINELP, A1GS, A2GS, BETS, BETA2SER, GAMS, MSPIKE, SPEI   Chemistry      Component Value Date/Time   NA 139 10/12/2020 1134   K 3.5 10/12/2020 1134   CL 98 10/12/2020 1134   CO2 34 (H) 10/12/2020 1134   BUN 15  10/12/2020 1134   CREATININE 0.70 10/12/2020 1134   CREATININE 0.78 03/23/2018 1018      Component Value Date/Time   CALCIUM 10.6 (H) 10/12/2020 1134   ALKPHOS 77 10/12/2020 1134   AST 22 10/12/2020 1134   ALT 20 10/12/2020 1134   ALT 40 (H) 11/25/2017 0947   BILITOT 0.4 10/12/2020 1134       Impression and Plan: Melanie Alvarez is a very pleasant 65 yo African American female with adenocarcinoma of the pancreas, ampullary carcinoma.  I am happy that she is doing well right now.  Again, not sure what of what to make of this 11 mm right lower pole renal lesion.  Again we will have to get an MRI to get a better look at this.  Again I know that Melanie Alvarez is not all that keen on having to do scans.  I would like to hope that this is going to be a low-grade slow-growing tumor that we can just watch over a matter of years.  I will still plan to get her back to see [place] in another 3 or 4 months.  I told her that we really have to get scans whenever we see her so that we can follow-up with her pancreatic cancer which is still much more of an issue for recurrence then any renal cell carcinoma.  I did leave her a message on her answering machine about the CT scan results.  I do not get them back till after she had left.  I told her about the renal lesion and how we have to evaluate this.  She can always give [place] a call back if she has questions.    Volanda Napoleon, MD 2/25/20223:07 PM

## 2020-10-16 ENCOUNTER — Ambulatory Visit: Payer: BC Managed Care – PPO | Admitting: Hematology & Oncology

## 2020-10-16 ENCOUNTER — Other Ambulatory Visit: Payer: BC Managed Care – PPO

## 2020-10-16 ENCOUNTER — Other Ambulatory Visit (HOSPITAL_BASED_OUTPATIENT_CLINIC_OR_DEPARTMENT_OTHER): Payer: BC Managed Care – PPO

## 2020-10-17 ENCOUNTER — Telehealth: Payer: Self-pay | Admitting: *Deleted

## 2020-10-17 NOTE — Telephone Encounter (Signed)
Message received from patient stating that she would like to schedule a visit with Dr. Marin Olp to discuss CT results.  Dr. Marin Olp notified and would like to see pt next week.  Message sent to scheduling.

## 2020-10-26 ENCOUNTER — Inpatient Hospital Stay: Payer: BC Managed Care – PPO | Attending: Hematology & Oncology | Admitting: Hematology & Oncology

## 2020-10-26 ENCOUNTER — Other Ambulatory Visit: Payer: Self-pay

## 2020-10-26 ENCOUNTER — Encounter: Payer: Self-pay | Admitting: Hematology & Oncology

## 2020-10-26 DIAGNOSIS — N2889 Other specified disorders of kidney and ureter: Secondary | ICD-10-CM

## 2020-10-26 DIAGNOSIS — Z8507 Personal history of malignant neoplasm of pancreas: Secondary | ICD-10-CM | POA: Diagnosis present

## 2020-10-26 NOTE — Progress Notes (Signed)
Hematology and Oncology Follow Up Visit  Melanie Alvarez [id number] 03-12-1956 65 y.o. 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 [name] is here today for follow-up.  She comes in with her daughter.  She wanted talking about the recent CT scan that she had done.  Everything looked good with respect to the pancreatic mass.  However, there was a new 11 mm lesion noted in the right kidney in the lower pole.  Everything else looked fine.  There is no adenopathy.  We have her set up with an MRI.  This will give [place] a better idea as what we are looking at.  We have I told her that this was a very small lesion.  Would not aim sure if it is malignant.  Even if it were malignant, I probably would just watch it.  I would not put her through any type of invasive procedure.  I know she is worried about not doing anything if there is any evidence that this is malignant.  I think 1 possibility might be getting interventional radiology to do RFA or cryotherapy for something this small.  I told her that the evaluation for these small renal lesions is evolving.  Again, a lot of times, we just watch these lesions to see if they do grow.  Personally, this would be my recommendation for her.  Otherwise she is doing well.  She is working.  She is having no problems with abdominal pain.  There is no problems with hematuria or dysuria.  There is no fever.  She has had no cough.  She has had no leg swelling.  Overall, performance status is ECOG 0.   Medications:  Allergies as of 10/26/2020      Reactions   Lisinopril Swelling   Facial and upper lip      Medication List       Accurate as of October 26, 2020 12:09 PM. If you have any questions, ask your nurse or doctor.        amLODipine 10 MG  tablet Commonly known as: NORVASC TAKE 1 TABLET BY MOUTH AT BEDTIME   aspirin EC 81 MG tablet Take 81 mg by mouth daily.   atorvastatin 20 MG tablet Commonly known as: LIPITOR Take 1 tablet (20 mg total) by mouth daily. Need office visit for additional refills   Cholecalciferol 25 MCG (1000 UT) tablet Take 1,000 Units by mouth daily.   glucose blood test strip Test blood glucose once daily. One Touch Verio test strips   GOLD BOND MEDICATED BODY EX Apply 1 application topically daily.   Lancet Device Misc Check blood glucose once daily. One TCH 97L Delicapl LNC   lipase/protease/amylase 36000 UNITS Cpep capsule Commonly known as: Creon Take 2 capsules (72,000 Units total) by mouth 3 (three) times daily before meals. What changed: when to take this   loperamide 2 MG tablet Commonly known as: Imodium A-D Take 2 at onset of diarrhea, then 1 every 2hrs until 12hr without a BM. May take 2 tab every 4hrs at bedtime. If diarrhea recurs repeat.   metFORMIN 500 MG tablet Commonly known as: GLUCOPHAGE TAKE 1 TABLET BY MOUTH TWICE DAILY WITH A MEAL **NEED  OFFICE  VISIT  FOR  ADDITIONAL  REFILLS  **  metoprolol succinate 25 MG 24 hr tablet Commonly known as: TOPROL-XL TAKE 1 TABLET BY MOUTH ONCE DAILY **NEED OFFICE VISIT FOR ADDITIONAL REFILLS**   multivitamin with minerals tablet Take 1 tablet by mouth daily.   olmesartan 5 MG tablet Commonly known as: BENICAR Take 1 tablet by mouth once daily   pantoprazole 40 MG tablet Commonly known as: PROTONIX Take 1 tablet (40 mg total) by mouth 2 (two) times daily.   tetrahydrozoline 0.05 % ophthalmic solution Place 1 drop into both eyes daily as needed (burning eye).       Allergies:  Allergies  Allergen Reactions  . Lisinopril Swelling    Facial and upper lip    Past Medical History, Surgical history, Social history, and Family History were reviewed and updated.  Review of Systems: Review of Systems  Constitutional:  Negative.   HENT: Negative.   Eyes: Negative.   Respiratory: Negative.   Cardiovascular: Negative.   Gastrointestinal: Negative.   Genitourinary: Negative.   Musculoskeletal: Negative.   Skin: Negative.   Neurological: Negative.   Endo/Heme/Allergies: Negative.   Psychiatric/Behavioral: Negative.       Physical Exam:  weight is 214 lb (97.1 kg). Her oral temperature is 98.2 F (36.8 C). Her blood pressure is 119/60 and her pulse is 78. Her respiration is 18 and oxygen saturation is 100%.   Wt Readings from Last 3 Encounters:  10/26/20 214 lb (97.1 kg)  10/12/20 218 lb (98.9 kg)  06/16/20 219 lb (99.3 kg)    Physical Exam Vitals reviewed.  HENT:     Head: Normocephalic and atraumatic.  Eyes:     Pupils: Pupils are equal, round, and reactive to light.  Cardiovascular:     Rate and Rhythm: Normal rate and regular rhythm.     Heart sounds: Normal heart sounds.  Pulmonary:     Effort: Pulmonary effort is normal.     Breath sounds: Normal breath sounds.  Abdominal:     General: Bowel sounds are normal.     Palpations: Abdomen is soft.  Musculoskeletal:        General: No tenderness or deformity. Normal range of motion.     Cervical back: Normal range of motion.     Comments: Extremities shows decreased range of motion of the left shoulder.  There is decreased abduction.  She has very little rotation of the left shoulder.  Lymphadenopathy:     Cervical: No cervical adenopathy.  Skin:    General: Skin is warm and dry.     Findings: No erythema or rash.  Neurological:     Mental Status: She is alert and oriented to person, place, and time.  Psychiatric:        Behavior: Behavior normal.        Thought Content: Thought content normal.        Judgment: Judgment normal.      Lab Results  Component Value Date   WBC 9.2 10/12/2020   HGB 14.2 10/12/2020   HCT 44.3 10/12/2020   MCV 86.5 10/12/2020   PLT 386 10/12/2020   No results found for: FERRITIN, IRON, TIBC,  UIBC, IRONPCTSAT Lab Results  Component Value Date   RBC 5.12 (H) 10/12/2020   No results found for: KPAFRELGTCHN, LAMBDASER, KAPLAMBRATIO No results found for: IGGSERUM, IGA, IGMSERUM No results found for: TOTALPROTELP, ALBUMINELP, A1GS, A2GS, BETS, BETA2SER, GAMS, MSPIKE, SPEI   Chemistry      Component Value Date/Time   NA 139 10/12/2020 1134   K 3.5  10/12/2020 1134   CL 98 10/12/2020 1134   CO2 34 (H) 10/12/2020 1134   BUN 15 10/12/2020 1134   CREATININE 0.70 10/12/2020 1134   CREATININE 0.78 03/23/2018 1018      Component Value Date/Time   CALCIUM 10.6 (H) 10/12/2020 1134   ALKPHOS 77 10/12/2020 1134   AST 22 10/12/2020 1134   ALT 20 10/12/2020 1134   ALT 40 (H) 11/25/2017 0947   BILITOT 0.4 10/12/2020 1134       Impression and Plan: Ms. Benton is a very pleasant 65 yo African American female with adenocarcinoma of the pancreas, ampullary carcinoma.  At this point time, her focus is going to be on this right renal lesion.  Again this is in the lower pole of the right kidney.  Again I told her that if this was felt to be malignant on MRI, I would just watch it and see if there is any growth and how quickly it does grow.  We will see what the MRI shows.  This will be done in a couple weeks.  I would like to get her back for her regular appointment.  I think this is in May.  We will get her back sooner if there is any issues on the MRI.   Volanda Napoleon, MD 3/10/202212:09 PM

## 2020-10-27 ENCOUNTER — Telehealth: Payer: Self-pay

## 2020-10-27 NOTE — Telephone Encounter (Signed)
No 10/27/20 los    Melanie Alvarez

## 2020-11-10 ENCOUNTER — Ambulatory Visit (HOSPITAL_COMMUNITY): Payer: BC Managed Care – PPO

## 2020-11-10 ENCOUNTER — Other Ambulatory Visit: Payer: Self-pay | Admitting: Hematology & Oncology

## 2020-12-01 ENCOUNTER — Other Ambulatory Visit: Payer: Self-pay

## 2020-12-01 ENCOUNTER — Ambulatory Visit (HOSPITAL_COMMUNITY)
Admission: RE | Admit: 2020-12-01 | Discharge: 2020-12-01 | Disposition: A | Discharge status: Home / Self Care | Payer: BC Managed Care – PPO | Source: Ambulatory Visit | Attending: Hematology & Oncology | Admitting: Hematology & Oncology

## 2020-12-01 DIAGNOSIS — N2889 Other specified disorders of kidney and ureter: Secondary | ICD-10-CM | POA: Insufficient documentation

## 2020-12-01 IMAGING — MR MR ABDOMEN WO/W CM
16 series · 48 of 48 positions shown · IV contrast (gadavist)
Comparison: [DATE]

CLINICAL DATA: Characterize inferior pole right renal renal lesion

EXAM:
MRI ABDOMEN WITHOUT AND WITH CONTRAST
TECHNIQUE: Multiplanar multisequence MR imaging of the abdomen was performed
both before and after the administration of intravenous contrast.
CONTRAST:  10mL GADAVIST GADOBUTROL 1 MMOL/ML IV SOLN

[Series 3: T2 fat-sat · axial · 6.0mm · 1.25mm/px · 1 of 42 slices shown]
[im 1/42]
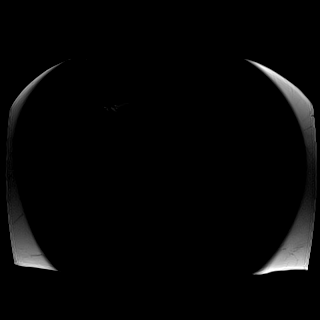

[Series 5: T2 · coronal · 6.0mm · 1.56mm/px · 1 of 36 slices shown (1 of 2)]
[im 1/36]
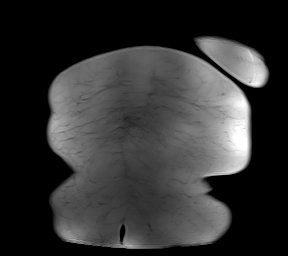

[Series 6: DWI · axial · 6.0mm · 1.49mm/px · z∈[-75,+191]mm · 2 of 76 slices shown (1 of 2)]
[im 1/76]
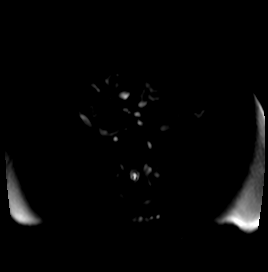
[im 76/76]
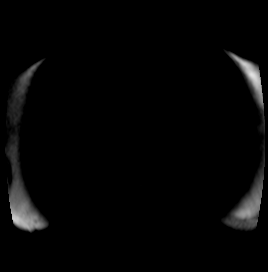

[Series 7: DWI · axial · 6.0mm · 1.49mm/px · 1 of 38 slices shown (2 of 2)]
[im 1/38]
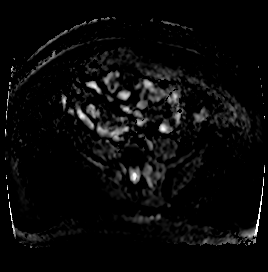

[Series 8: T1 · axial · 3.0mm · 1.25mm/px · z∈[-72,+165]mm · 4 of 80 slices shown (1 of 2)]
[im 1/80]
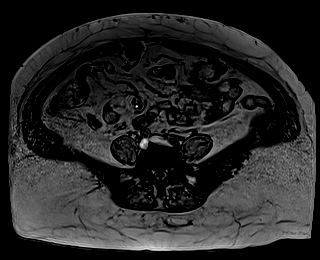
[im 27/80]
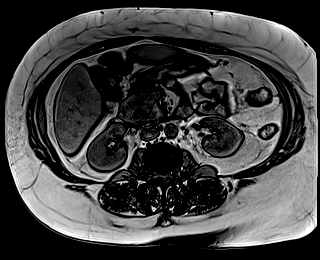
[im 53/80]
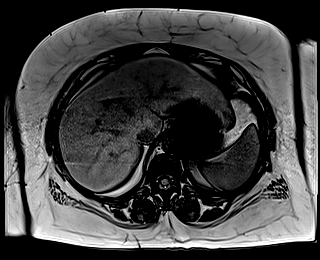
[im 80/80]
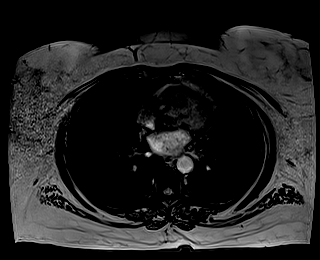

[Series 9: T1 · axial · 3.0mm · 1.25mm/px · z∈[-72,+165]mm · 4 of 80 slices shown (2 of 2)]
[im 1/80]
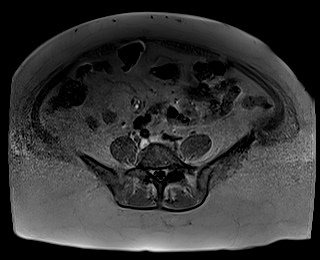
[im 27/80]
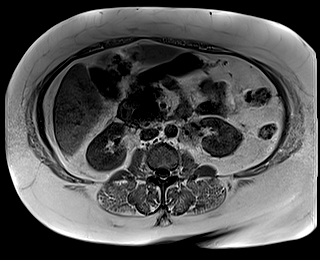
[im 53/80]
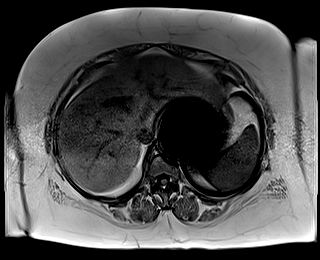
[im 80/80]
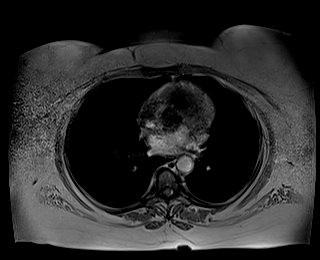

[Series 10: bSSFP · axial · 5.0mm · 0.84mm/px · z∈[-96,+179]mm · 2 of 51 slices shown]
[im 1/51]
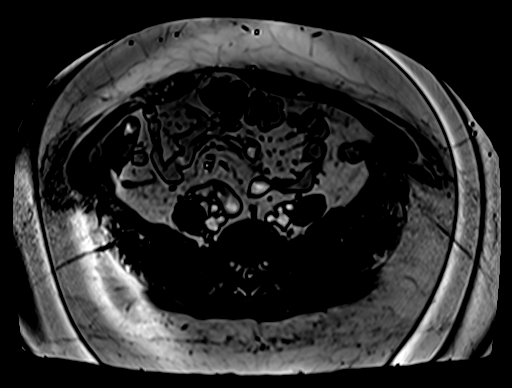
[im 51/51]
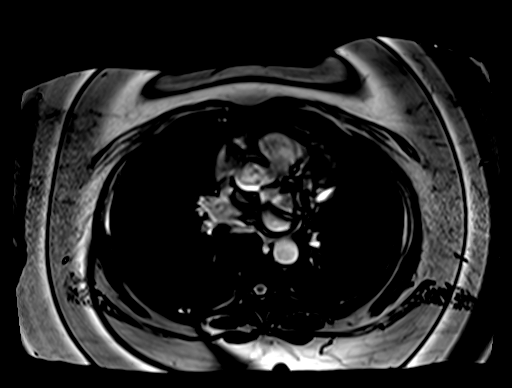

[Series 12: T1 dynamic · axial · 3.0mm · 1.25mm/px · z∈[-61,+176]mm · 4 of 80 slices shown (1 of 8)]
[im 1/80]
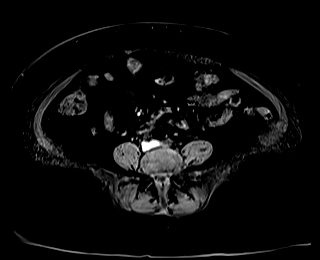
[im 27/80]
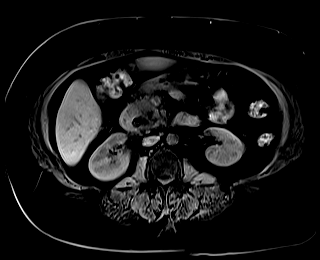
[im 53/80]
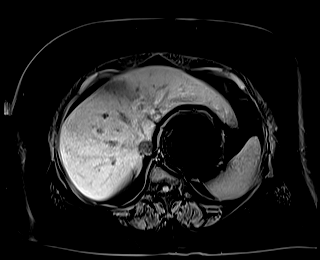
[im 80/80]
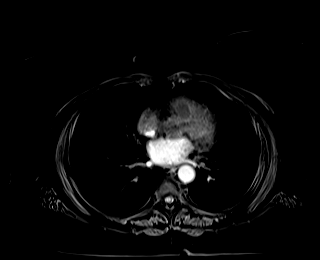

[Series 16: T1 dynamic · axial · 3.0mm · 1.25mm/px · z∈[-61,+176]mm · 4 of 80 slices shown (2 of 8)]
[im 1/80]
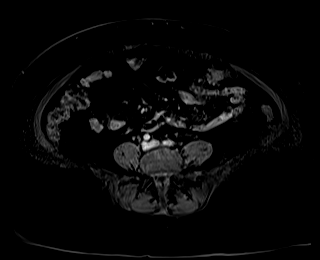
[im 27/80]
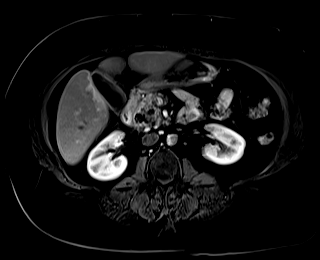
[im 53/80]
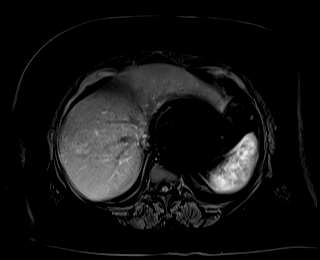
[im 80/80]
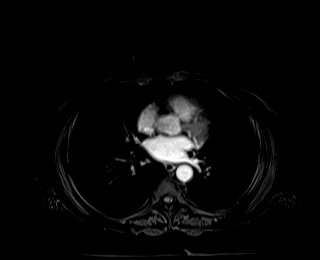

[Series 17: T1 dynamic · axial · 3.0mm · 1.25mm/px · z∈[-61,+176]mm · 4 of 80 slices shown (3 of 8)]
[im 1/80]
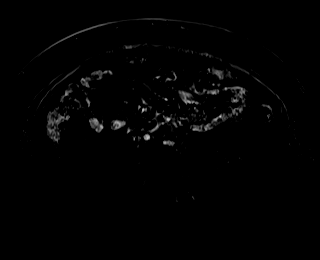
[im 27/80]
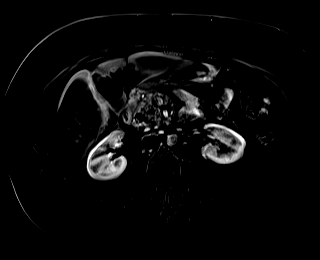
[im 53/80]
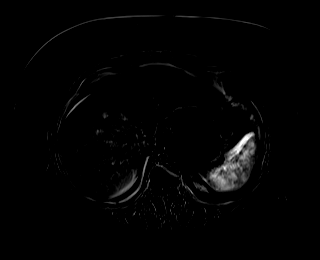
[im 80/80]
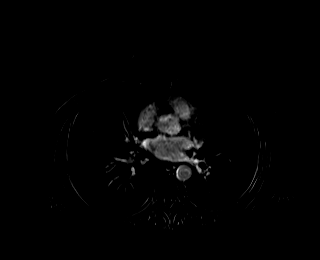

[Series 20: T1 dynamic · axial · 3.0mm · 1.25mm/px · z∈[-61,+176]mm · 4 of 80 slices shown (4 of 8)]
[im 1/80]
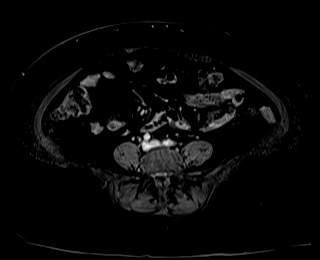
[im 27/80]
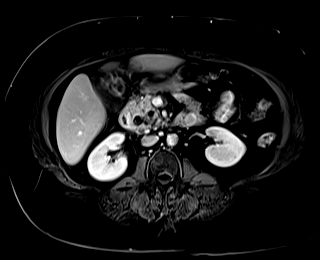
[im 53/80]
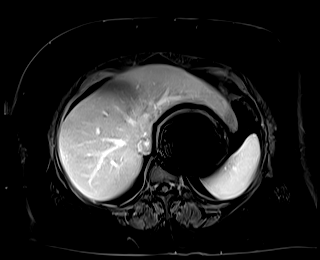
[im 80/80]
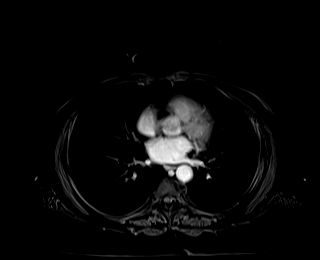

[Series 21: T1 dynamic · axial · 3.0mm · 1.25mm/px · z∈[-61,+176]mm · 4 of 80 slices shown (5 of 8)]
[im 1/80]
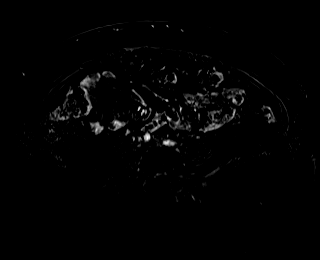
[im 27/80]
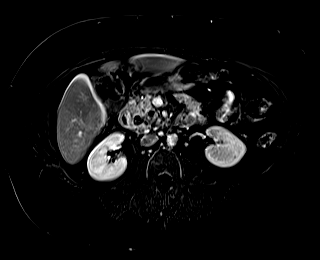
[im 53/80]
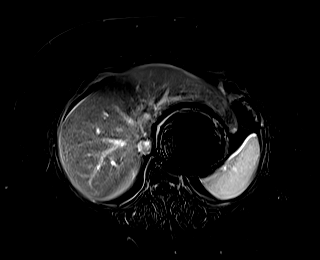
[im 80/80]
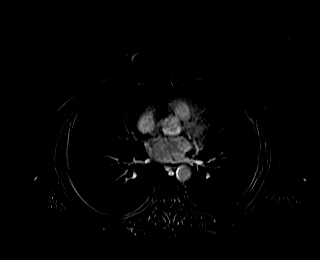

[Series 24: T1 dynamic · axial · 3.0mm · 1.25mm/px · z∈[-61,+176]mm · 4 of 80 slices shown (6 of 8)]
[im 1/80]
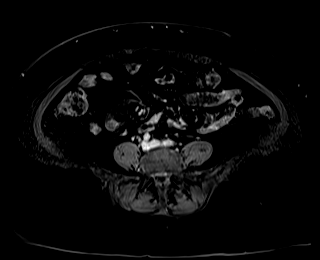
[im 27/80]
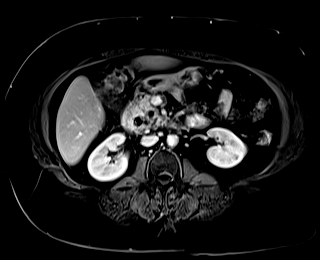
[im 53/80]
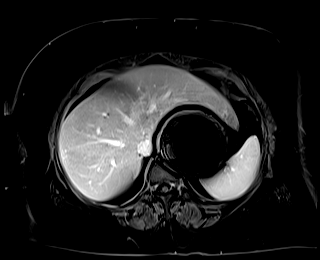
[im 80/80]
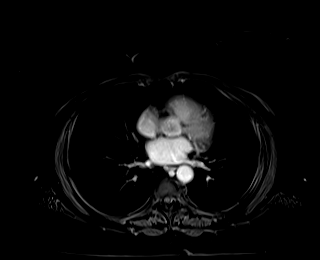

[Series 25: T1 dynamic · axial · 3.0mm · 1.25mm/px · z∈[-61,+176]mm · 4 of 80 slices shown (7 of 8)]
[im 1/80]
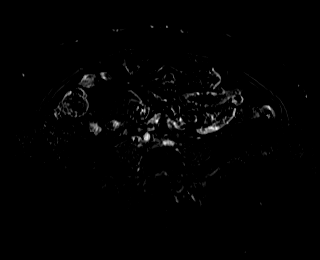
[im 27/80]
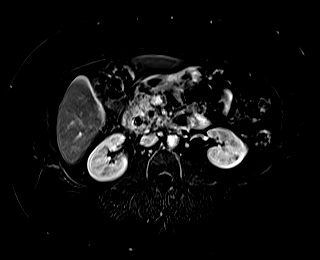
[im 53/80]
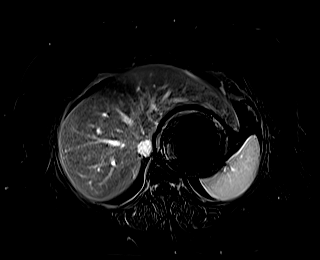
[im 80/80]
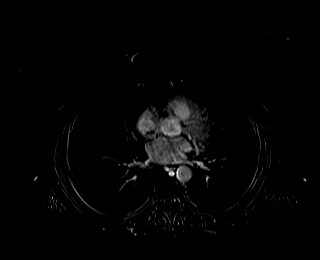

[Series 27: T1 dynamic · coronal · 5.0mm · 1.41mm/px · 3 of 56 slices shown (8 of 8)]
[im 1/56]
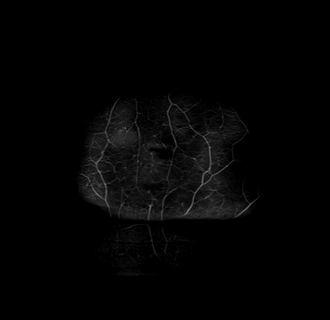
[im 28/56]
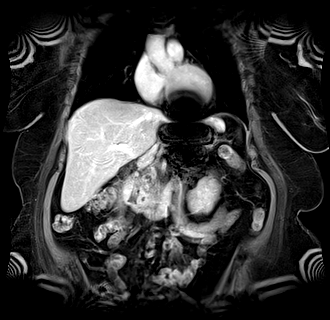
[im 56/56]
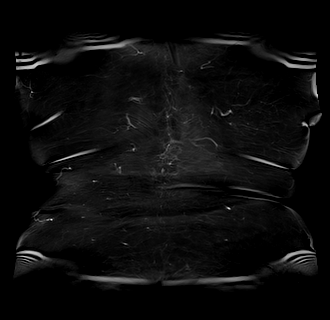

[Series 28: T2 · axial · 6.0mm · 1.56mm/px · z∈[-81,+164]mm · 2 of 35 slices shown (2 of 2)]
[im 1/35]
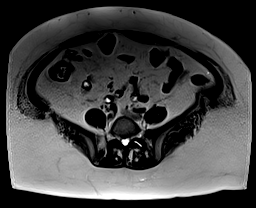
[im 35/35]
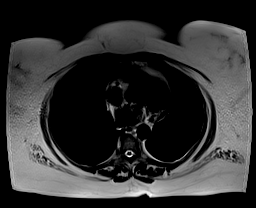

[48 of 48 positions shown; findings below may reference images not displayed]

FINDINGS: Lower chest: No acute findings.

Hepatobiliary: No mass or other parenchymal abnormality identified.
Status post central common bile duct stenting. No biliary ductal
dilatation.

Pancreas: No significant change in ill-defined soft tissue mass of
the pancreatic head, measuring approximately 3.8 x 3.0 cm, with
internal calcification and severe pancreatic ductal dilatation.
Evaluation of the pancreatic tail is nondiagnostic due to metallic
susceptibility artifact from surgical clips in the adjacent
stomach.

Spleen:  Within normal limits in size and appearance.

Adrenals/Urinary Tract: There is a heterogeneous, partially
exophytic mass of the inferior pole of the right kidney
demonstrating peripheral, nodular contrast enhancement and evidence
of internal fat saturation and dropout, measuring approximately
x 1.3 cm (series 16, image 70). No evidence of hydronephrosis.

Stomach/Bowel: Extensive metallic susceptibility artifact associated
with surgical clips near the gastroesophageal junction. Visualized
portions within the abdomen are otherwise unremarkable.

Vascular/Lymphatic: No pathologically enlarged lymph nodes
identified. No abdominal aortic aneurysm demonstrated.

Other:  None.

Musculoskeletal: No suspicious bone lesions identified.
IMPRESSION: 1. Heterogeneous, partially exophytic mass of the inferior pole of
the right kidney with clear contrast enhancement as well as
demonstrating evidence of internal fat saturation and dropout,
measuring approximately 1.4 x 1.3 cm. This is consistent with a
small renal cell carcinoma. Although fat content is usually
associated with benign lesions, renal cell carcinomas can
occasionally contain macroscopic fat.
2. No significant change in ill-defined soft tissue mass of the
pancreatic head, measuring approximately 3.8 x 3.0 cm, with internal
calcification and severe pancreatic ductal dilatation. Evaluation of
the pancreatic tail is nondiagnostic due to metallic susceptibility
artifact from surgical clips in the adjacent stomach. Findings are
consistent with known pancreatic adenocarcinoma.
3. Status post common bile duct stenting. No biliary ductal
dilatation.

## 2020-12-01 MED ORDER — GADOBUTROL 1 MMOL/ML IV SOLN
10.0000 mL | Freq: Once | INTRAVENOUS | Status: AC | PRN
  Administered 2020-12-01: 10 mL via INTRAVENOUS

## 2020-12-21 ENCOUNTER — Other Ambulatory Visit: Payer: Self-pay

## 2020-12-21 ENCOUNTER — Inpatient Hospital Stay: Payer: BC Managed Care – PPO | Attending: Hematology & Oncology

## 2020-12-21 ENCOUNTER — Ambulatory Visit (HOSPITAL_BASED_OUTPATIENT_CLINIC_OR_DEPARTMENT_OTHER)
Admission: RE | Admit: 2020-12-21 | Discharge: 2020-12-21 | Disposition: A | Discharge status: Home / Self Care | Payer: BC Managed Care – PPO | Source: Ambulatory Visit | Attending: Hematology & Oncology | Admitting: Hematology & Oncology

## 2020-12-21 ENCOUNTER — Encounter (HOSPITAL_BASED_OUTPATIENT_CLINIC_OR_DEPARTMENT_OTHER): Payer: Self-pay

## 2020-12-21 DIAGNOSIS — C250 Malignant neoplasm of head of pancreas: Secondary | ICD-10-CM

## 2020-12-21 DIAGNOSIS — Z8507 Personal history of malignant neoplasm of pancreas: Secondary | ICD-10-CM | POA: Diagnosis present

## 2020-12-21 LAB — CBC WITH DIFFERENTIAL (CANCER CENTER ONLY)
Abs Immature Granulocytes: 0.03 10*3/uL (ref 0.00–0.07)
Basophils Absolute: 0.1 10*3/uL (ref 0.0–0.1)
Basophils Relative: 1 %
Eosinophils Absolute: 0.2 10*3/uL (ref 0.0–0.5)
Eosinophils Relative: 2 %
HCT: 38.9 % (ref 36.0–46.0)
Hemoglobin: 13.2 g/dL (ref 12.0–15.0)
Immature Granulocytes: 0 %
Lymphocytes Relative: 19 %
Lymphs Abs: 1.4 10*3/uL (ref 0.7–4.0)
MCH: 28.8 pg (ref 26.0–34.0)
MCHC: 33.9 g/dL (ref 30.0–36.0)
MCV: 84.7 fL (ref 80.0–100.0)
Monocytes Absolute: 0.4 10*3/uL (ref 0.1–1.0)
Monocytes Relative: 6 %
Neutro Abs: 5.3 10*3/uL (ref 1.7–7.7)
Neutrophils Relative %: 72 %
Platelet Count: 329 10*3/uL (ref 150–400)
RBC: 4.59 MIL/uL (ref 3.87–5.11)
RDW: 12.7 % (ref 11.5–15.5)
WBC Count: 7.4 10*3/uL (ref 4.0–10.5)
nRBC: 0 % (ref 0.0–0.2)

## 2020-12-21 LAB — CMP (CANCER CENTER ONLY)
ALT: 13 U/L (ref 0–44)
AST: 13 U/L — ABNORMAL LOW (ref 15–41)
Albumin: 3.8 g/dL (ref 3.5–5.0)
Alkaline Phosphatase: 61 U/L (ref 38–126)
Anion gap: 9 (ref 5–15)
BUN: 12 mg/dL (ref 8–23)
CO2: 28 mmol/L (ref 22–32)
Calcium: 9.6 mg/dL (ref 8.9–10.3)
Chloride: 101 mmol/L (ref 98–111)
Creatinine: 0.6 mg/dL (ref 0.44–1.00)
GFR, Estimated: >60 mL/min (ref >60)
Glucose, Bld: 181 mg/dL — ABNORMAL HIGH (ref 70–99)
Potassium: 3.7 mmol/L (ref 3.5–5.1)
Sodium: 138 mmol/L (ref 135–145)
Total Bilirubin: 0.4 mg/dL (ref 0.3–1.2)
Total Protein: 6.8 g/dL (ref 6.5–8.1)

## 2020-12-21 LAB — LACTATE DEHYDROGENASE: LDH: 156 U/L (ref 98–192)

## 2020-12-21 MED ORDER — IOHEXOL 300 MG/ML  SOLN
100.0000 mL | Freq: Once | INTRAMUSCULAR | Status: AC | PRN
  Administered 2020-12-21: 100 mL via INTRAVENOUS

## 2020-12-22 ENCOUNTER — Telehealth: Payer: Self-pay

## 2020-12-22 LAB — CANCER ANTIGEN 19-9: CA 19-9: <2 U/mL (ref 0–35)

## 2020-12-22 NOTE — Telephone Encounter (Signed)
Called and informed patient of CT results, patient very appreciative and verbalized understanding and denies any questions or concerns at this time.

## 2020-12-22 NOTE — Telephone Encounter (Signed)
-----   Message from Volanda Napoleon, MD sent at 12/22/2020  3:08 PM EDT ----- Call - the CT scan looks stable.  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  Happy Mother's Day!!  Melanie Alvarez

## 2020-12-27 ENCOUNTER — Telehealth: Payer: Self-pay | Admitting: *Deleted

## 2020-12-27 NOTE — Telephone Encounter (Signed)
Spoke w/ pt regarding referral to Kidney specialist Dr. Dorina Hoyer. Pt advised she has received a call from their office. Pt requested to cancel appt 5/12 as she has been given scan results and all her concerns have been answered. No further concerns. Message to scheduling.

## 2020-12-28 ENCOUNTER — Inpatient Hospital Stay: Payer: BC Managed Care – PPO | Admitting: Hematology & Oncology

## 2021-01-20 ENCOUNTER — Other Ambulatory Visit: Payer: Self-pay | Admitting: Nurse Practitioner

## 2021-01-20 DIAGNOSIS — E119 Type 2 diabetes mellitus without complications: Secondary | ICD-10-CM

## 2021-01-24 ENCOUNTER — Other Ambulatory Visit: Payer: Self-pay

## 2021-01-25 ENCOUNTER — Ambulatory Visit: Payer: BC Managed Care – PPO | Admitting: Nurse Practitioner

## 2021-01-25 ENCOUNTER — Encounter: Payer: Self-pay | Admitting: Nurse Practitioner

## 2021-01-25 VITALS — BP 130/76 | HR 82 | Temp 97.4°F | Ht 63.0 in | Wt 214.0 lb

## 2021-01-25 DIAGNOSIS — E790 Hyperuricemia without signs of inflammatory arthritis and tophaceous disease: Secondary | ICD-10-CM | POA: Diagnosis not present

## 2021-01-25 DIAGNOSIS — I10 Essential (primary) hypertension: Secondary | ICD-10-CM

## 2021-01-25 DIAGNOSIS — E782 Mixed hyperlipidemia: Secondary | ICD-10-CM | POA: Diagnosis not present

## 2021-01-25 DIAGNOSIS — Z6837 Body mass index (BMI) 37.0-37.9, adult: Secondary | ICD-10-CM

## 2021-01-25 DIAGNOSIS — G629 Polyneuropathy, unspecified: Secondary | ICD-10-CM

## 2021-01-25 DIAGNOSIS — E1169 Type 2 diabetes mellitus with other specified complication: Secondary | ICD-10-CM | POA: Diagnosis not present

## 2021-01-25 DIAGNOSIS — G4700 Insomnia, unspecified: Secondary | ICD-10-CM

## 2021-01-25 LAB — POCT GLYCOSYLATED HEMOGLOBIN (HGB A1C): Hemoglobin A1C: 7.5 % — AB (ref 4.0–5.6)

## 2021-01-25 MED ORDER — ATORVASTATIN CALCIUM 20 MG PO TABS
20.0000 mg | ORAL_TABLET | Freq: Every day | ORAL | 3 refills | Status: DC
Start: 2021-01-25 — End: 2022-04-15

## 2021-01-25 MED ORDER — METOPROLOL SUCCINATE ER 25 MG PO TB24
ORAL_TABLET | ORAL | 3 refills | Status: DC
Start: 2021-01-25 — End: 2022-03-24

## 2021-01-25 MED ORDER — OLMESARTAN MEDOXOMIL 5 MG PO TABS
5.0000 mg | ORAL_TABLET | Freq: Every day | ORAL | 3 refills | Status: DC
Start: 2021-01-25 — End: 2022-02-11

## 2021-01-25 MED ORDER — METFORMIN HCL ER 750 MG PO TB24: 750.0000 mg | ORAL_TABLET | Freq: Every day | ORAL | 1 refills | Status: DC

## 2021-01-25 NOTE — Progress Notes (Signed)
abnormal, Inform patient about medication changes

## 2021-01-25 NOTE — Assessment & Plan Note (Signed)
Advised to schedule appt with nutritionist and maintain daily exercise.

## 2021-01-25 NOTE — Assessment & Plan Note (Signed)
Resume atorvastatin Repeat lipid panel in 60months

## 2021-01-25 NOTE — Assessment & Plan Note (Signed)
BP at goal with benicar and metoprolol Reviewed CMP completed by oncology 12/2020: stable renal function BP Readings from Last 3 Encounters:  01/25/21 130/76  10/26/20 119/60  10/12/20 (!) 162/74   CMP     Component Value Date/Time   NA 138 12/21/2020 0807   K 3.7 12/21/2020 0807   CL 101 12/21/2020 0807   CO2 28 12/21/2020 0807   GLUCOSE 181 (H) 12/21/2020 0807   BUN 12 12/21/2020 0807   CREATININE 0.60 12/21/2020 0807   CREATININE 0.78 03/23/2018 1018   CALCIUM 9.6 12/21/2020 0807   PROT 6.8 12/21/2020 0807   ALBUMIN 3.8 12/21/2020 0807   AST 13 (L) 12/21/2020 0807   ALT 13 12/21/2020 0807   ALT 40 (H) 11/25/2017 0947   ALKPHOS 61 12/21/2020 0807   BILITOT 0.4 12/21/2020 0807   GFRNONAA >60 12/21/2020 0807   GFRNONAA 96 11/25/2017 0944   GFRAA >60 03/30/2020 1440   GFRAA 111 11/25/2017 0944   Med refill sent

## 2021-01-25 NOTE — Assessment & Plan Note (Addendum)
POCT HgbA1c of 7.5% today. Admits to not following low carb/low sugar diet. Foot exam: neuropathy (L>R) Advised to schedule eye exam with ophthalmology. Increase metformin to 750mg  BID Resume atorvastatin Repeat labs if 8months

## 2021-01-25 NOTE — Patient Instructions (Addendum)
Todays' hgbA1c of 7.5%: I changed metformin dose to 750mg  BID with meal. New rx sent Also start B12 supplement 1027mcg daily from over the counter. This helps with neuropathy.  Return to lab in 74months for fasting blood draw.  Schedule appt with nutritionist  Decrease caffeine intake. Limit to 1cup in AM and anone past 12noon. You may use benadryl 25mg  at bedtime for sleep.  Insomnia Insomnia is a sleep disorder that makes it difficult to fall asleep or stay asleep. Insomnia can cause fatigue, low energy, difficulty concentrating, mood swings, and poor performance at work or school. There are three different ways to classify insomnia: Difficulty falling asleep. Difficulty staying asleep. Waking up too early in the morning. Any type of insomnia can be long-term (chronic) or short-term (acute). Both are common. Short-term insomnia usually lasts for three months or less. Chronic insomnia occurs at least three times a week for longer than three months. What are the causes? Insomnia may be caused by another condition, situation, or substance, such as: Anxiety. Certain medicines. Gastroesophageal reflux disease (GERD) or other gastrointestinal conditions. Asthma or other breathing conditions. Restless legs syndrome, sleep apnea, or other sleep disorders. Chronic pain. Menopause. Stroke. Abuse of alcohol, tobacco, or illegal drugs. Mental health conditions, such as depression. Caffeine. Neurological disorders, such as Alzheimer's disease. An overactive thyroid (hyperthyroidism). Sometimes, the cause of insomnia may not be known. What increases the risk? Risk factors for insomnia include: Gender. Women are affected more often than men. Age. Insomnia is more common as you get older. Stress. Lack of exercise. Irregular work schedule or working night shifts. Traveling between different time zones. Certain medical and mental health conditions. What are the signs or symptoms? If you  have insomnia, the main symptom is having trouble falling asleep or having trouble staying asleep. This may lead to other symptoms, such as: Feeling fatigued or having low energy. Feeling nervous about going to sleep. Not feeling rested in the morning. Having trouble concentrating. Feeling irritable, anxious, or depressed. How is this diagnosed? This condition may be diagnosed based on: Your symptoms and medical history. Your health care provider may ask about: Your sleep habits. Any medical conditions you have. Your mental health. A physical exam. How is this treated? Treatment for insomnia depends on the cause. Treatment may focus on treating an underlying condition that is causing insomnia. Treatment may also include: Medicines to help you sleep. Counseling or therapy. Lifestyle adjustments to help you sleep better. Follow these instructions at home: Eating and drinking Limit or avoid alcohol, caffeinated beverages, and cigarettes, especially close to bedtime. These can disrupt your sleep. Do not eat a large meal or eat spicy foods right before bedtime. This can lead to digestive discomfort that can make it hard for you to sleep.   Sleep habits Keep a sleep diary to help you and your health care provider figure out what could be causing your insomnia. Write down: When you sleep. When you wake up during the night. How well you sleep. How rested you feel the next day. Any side effects of medicines you are taking. What you eat and drink. Make your bedroom a dark, comfortable place where it is easy to fall asleep. Put up shades or blackout curtains to block light from outside. Use a white noise machine to block noise. Keep the temperature cool. Limit screen use before bedtime. This includes: Watching TV. Using your smartphone, tablet, or computer. Stick to a routine that includes going to bed and waking up at  the same times every day and night. This can help you fall asleep  faster. Consider making a quiet activity, such as reading, part of your nighttime routine. Try to avoid taking naps during the day so that you sleep better at night. Get out of bed if you are still awake after 15 minutes of trying to sleep. Keep the lights down, but try reading or doing a quiet activity. When you feel sleepy, go back to bed.   General instructions Take over-the-counter and prescription medicines only as told by your health care provider. Exercise regularly, as told by your health care provider. Avoid exercise starting several hours before bedtime. Use relaxation techniques to manage stress. Ask your health care provider to suggest some techniques that may work well for you. These may include: Breathing exercises. Routines to release muscle tension. Visualizing peaceful scenes. Make sure that you drive carefully. Avoid driving if you feel very sleepy. Keep all follow-up visits as told by your health care provider. This is important. Contact a health care provider if: You are tired throughout the day. You have trouble in your daily routine due to sleepiness. You continue to have sleep problems, or your sleep problems get worse. Get help right away if: You have serious thoughts about hurting yourself or someone else. If you ever feel like you may hurt yourself or others, or have thoughts about taking your own life, get help right away. You can go to your nearest emergency department or call: Your local emergency services (911 in the [place] A suicide crisis helpline, such as the [facility] at 774-793-2537. This is open 24 hours a day. Summary Insomnia is a sleep disorder that makes it difficult to fall asleep or stay asleep. Insomnia can be long-term (chronic) or short-term (acute). Treatment for insomnia depends on the cause. Treatment may focus on treating an underlying condition that is causing insomnia. Keep a sleep diary to help you and your  health care provider figure out what could be causing your insomnia. This information is not intended to replace advice given to you by your health care provider. Make sure you discuss any questions you have with your health care provider. Document Revised: 06/15/2020 Document Reviewed: 06/15/2020 Elsevier Patient Education  2021 [employer].

## 2021-01-25 NOTE — Progress Notes (Signed)
Subjective:  Patient ID: Melanie Alvarez, female    DOB: 05/13/1956  Age: 65 y.o. MRN: [id number]  CC: Follow-up (Pt states she needs medication refill on Metformin and Amlodipine. Pt is not fasting. )  Insomnia Primary symptoms: fragmented sleep, frequent awakening, premature morning awakening.   The current episode started more than one month (9months ago). The onset quality is gradual. The problem occurs nightly. The problem is unchanged. The symptoms are aggravated by bed partner, caffeine and tobacco. How many beverages per day that contain caffeine: 4-5.  Types of beverages you drink: coffee and soda. Nothing relieves the symptoms. Past treatments include meditation and medication (melatonin 9mg ). The treatment provided no relief. Typical bedtime:  8-10 P.M..  How long after going to bed to you fall asleep: 15-30 minutes.   Sleep duration: no daytime naps.  PMH includes: hypertension, no depression, no work related stressors, no chronic pain.  Prior diagnostic workup includes:  No prior workup.   Hypertension BP at goal with benicar and metoprolol Reviewed CMP completed by oncology 12/2020: stable renal function BP Readings from Last 3 Encounters:  01/25/21 130/76  10/26/20 119/60  10/12/20 (!) 162/74   CMP     Component Value Date/Time   NA 138 12/21/2020 0807   K 3.7 12/21/2020 0807   CL 101 12/21/2020 0807   CO2 28 12/21/2020 0807   GLUCOSE 181 (H) 12/21/2020 0807   BUN 12 12/21/2020 0807   CREATININE 0.60 12/21/2020 0807   CREATININE 0.78 03/23/2018 1018   CALCIUM 9.6 12/21/2020 0807   PROT 6.8 12/21/2020 0807   ALBUMIN 3.8 12/21/2020 0807   AST 13 (L) 12/21/2020 0807   ALT 13 12/21/2020 0807   ALT 40 (H) 11/25/2017 0947   ALKPHOS 61 12/21/2020 0807   BILITOT 0.4 12/21/2020 0807   GFRNONAA >60 12/21/2020 0807   GFRNONAA 96 11/25/2017 0944   GFRAA >60 03/30/2020 1440   GFRAA 111 11/25/2017 0944   Med refill sent  DM (diabetes mellitus) ([place]) POCT HgbA1c of  7.5% today. Admits to not following low carb/low sugar diet. Foot exam: neuropathy (L>R) Advised to schedule eye exam with ophthalmology. Increase metformin to 750mg  BID Resume atorvastatin Repeat labs if 79months  Mixed hyperlipidemia Resume atorvastatin Repeat lipid panel in 8months  Hyperuricemia Stable renal function No acute gout exacerbation. Repeat uric acid in 64months  BP Readings from Last 3 Encounters:  01/25/21 130/76  10/26/20 119/60  10/12/20 (!) 162/74    Wt Readings from Last 3 Encounters:  01/25/21 214 lb (97.1 kg)  10/26/20 214 lb (97.1 kg)  10/12/20 218 lb (98.9 kg)    Reviewed past Medical, Social and Family history today.  Outpatient Medications Prior to Visit  Medication Sig Dispense Refill   amLODipine (NORVASC) 10 MG tablet TAKE 1 TABLET BY MOUTH AT BEDTIME 90 tablet 3   calcium-vitamin D (OSCAL WITH D) 500-200 MG-UNIT tablet Take 1 tablet by mouth.     CREON 36000-114000 units CPEP capsule TAKE 2 CAPSULES BY MOUTH THREE TIMES DAILY BEFORE MEALS 180 capsule 0   metFORMIN (GLUCOPHAGE) 500 MG tablet TAKE 1 TABLET BY MOUTH TWICE DAILY WITH A MEAL **NEED  OFFICE  VISIT  FOR  ADDITIONAL  REFILLS  ** 180 tablet 0   metoprolol succinate (TOPROL-XL) 25 MG 24 hr tablet TAKE 1 TABLET BY MOUTH ONCE DAILY **NEED OFFICE VISIT FOR ADDITIONAL REFILLS** 90 tablet 1   olmesartan (BENICAR) 5 MG tablet Take 1 tablet by mouth once daily 90 tablet 3  glucose blood test strip Test blood glucose once daily. One Touch Verio test strips (Patient not taking: No sig reported) 100 each 6   pantoprazole (PROTONIX) 40 MG tablet Take 1 tablet (40 mg total) by mouth 2 (two) times daily. 108 tablet 0   aspirin EC 81 MG tablet Take 81 mg by mouth daily. (Patient not taking: Reported on 01/25/2021)     atorvastatin (LIPITOR) 20 MG tablet Take 1 tablet (20 mg total) by mouth daily. Need office visit for additional refills (Patient not taking: Reported on 01/25/2021) 90 tablet 0    Cholecalciferol 25 MCG (1000 UT) tablet Take 1,000 Units by mouth daily.  (Patient not taking: Reported on 01/25/2021)     Emollient (GOLD BOND MEDICATED BODY EX) Apply 1 application topically daily. (Patient not taking: Reported on 01/25/2021)     Lancet Device MISC Check blood glucose once daily. One TCH 03K Delicapl LNC (Patient not taking: No sig reported) 100 each 6   loperamide (IMODIUM A-D) 2 MG tablet Take 2 at onset of diarrhea, then 1 every 2hrs until 12hr without a BM. May take 2 tab every 4hrs at bedtime. If diarrhea recurs repeat. (Patient not taking: No sig reported) 100 tablet 1   Multiple Vitamins-Minerals (MULTIVITAMIN WITH MINERALS) tablet Take 1 tablet by mouth daily. (Patient not taking: Reported on 01/25/2021)     tetrahydrozoline 0.05 % ophthalmic solution Place 1 drop into both eyes daily as needed (burning eye). (Patient not taking: Reported on 01/25/2021)     No facility-administered medications prior to visit.    ROS See HPI  Objective:  BP 130/76 (BP Location: Left Arm, Patient Position: Sitting, Cuff Size: Large)   Pulse 82   Temp (!) 97.4 F (36.3 C) (Temporal)   Ht 5\' 3"  (1.6 m)   Wt 214 lb (97.1 kg)   SpO2 96%   BMI 37.91 kg/m   Physical Exam Constitutional:      Appearance: She is obese.  Cardiovascular:     Pulses:          Dorsalis pedis pulses are 2+ on the right side and 2+ on the left side.       Posterior tibial pulses are 2+ on the right side and 2+ on the left side.  Musculoskeletal:     Right foot: Normal range of motion. No prominent metatarsal heads.     Left foot: Normal range of motion. No prominent metatarsal heads.  Feet:     Right foot:     Protective Sensation: 7 sites tested.  7 sites sensed.     Skin integrity: Skin integrity normal.     Toenail Condition: Right toenails are normal.     Left foot:     Protective Sensation: 7 sites tested.  7 sites sensed.     Skin integrity: Skin integrity normal.     Toenail Condition: Left  toenails are normal.  Neurological:     Mental Status: She is alert.    Assessment & Plan:  This visit occurred during the SARS-CoV-2 public health emergency.  Safety protocols were in place, including screening questions prior to the visit, additional usage of staff PPE, and extensive cleaning of exam room while observing appropriate contact time as indicated for disinfecting solutions.   Shaelee was seen today for follow-up.  Diagnoses and all orders for this visit:  Type 2 diabetes mellitus with other specified complication, without long-term current use of insulin (HCC) -     POCT HgB A1C -  metFORMIN (GLUCOPHAGE XR) 750 MG 24 hr tablet; Take 1 tablet (750 mg total) by mouth daily with breakfast. -     Hemoglobin A1c; Future -     Microalbumin / creatinine urine ratio; Future -     Hepatic function panel; Future  Class 2 severe obesity due to excess calories with serious comorbidity and body mass index (BMI) of 37.0 to 37.9 in adult (HCC) -     TSH; Future  Hyperuricemia -     Basic metabolic panel; Future -     Uric acid; Future  Mixed hyperlipidemia -     Hepatic function panel; Future -     Lipid panel; Future -     atorvastatin (LIPITOR) 20 MG tablet; Take 1 tablet (20 mg total) by mouth at bedtime.  Neuropathy  Essential hypertension -     metoprolol succinate (TOPROL-XL) 25 MG 24 hr tablet; TAKE 1 TABLET BY MOUTH ONCE DAILY -     olmesartan (BENICAR) 5 MG tablet; Take 1 tablet (5 mg total) by mouth daily. -     Basic metabolic panel; Future  Insomnia, unspecified type  Primary hypertension Decrease caffeine intake. Limit to 1cup in AM and anone past 12noon. You may use benadryl 25mg  at bedtime for sleep.  Problem List Items Addressed This Visit       Cardiovascular and Mediastinum   Hypertension    BP at goal with benicar and metoprolol Reviewed CMP completed by oncology 12/2020: stable renal function BP Readings from Last 3 Encounters:  01/25/21  130/76  10/26/20 119/60  10/12/20 (!) 162/74   CMP     Component Value Date/Time   NA 138 12/21/2020 0807   K 3.7 12/21/2020 0807   CL 101 12/21/2020 0807   CO2 28 12/21/2020 0807   GLUCOSE 181 (H) 12/21/2020 0807   BUN 12 12/21/2020 0807   CREATININE 0.60 12/21/2020 0807   CREATININE 0.78 03/23/2018 1018   CALCIUM 9.6 12/21/2020 0807   PROT 6.8 12/21/2020 0807   ALBUMIN 3.8 12/21/2020 0807   AST 13 (L) 12/21/2020 0807   ALT 13 12/21/2020 0807   ALT 40 (H) 11/25/2017 0947   ALKPHOS 61 12/21/2020 0807   BILITOT 0.4 12/21/2020 0807   GFRNONAA >60 12/21/2020 0807   GFRNONAA 96 11/25/2017 0944   GFRAA >60 03/30/2020 1440   GFRAA 111 11/25/2017 0944   Med refill sent       Relevant Medications   metoprolol succinate (TOPROL-XL) 25 MG 24 hr tablet   olmesartan (BENICAR) 5 MG tablet   atorvastatin (LIPITOR) 20 MG tablet     Endocrine   DM (diabetes mellitus) ([place]) - Primary    POCT HgbA1c of 7.5% today. Admits to not following low carb/low sugar diet. Foot exam: neuropathy (L>R) Advised to schedule eye exam with ophthalmology. Increase metformin to 750mg  BID Resume atorvastatin Repeat labs if 70months       Relevant Medications   olmesartan (BENICAR) 5 MG tablet   metFORMIN (GLUCOPHAGE XR) 750 MG 24 hr tablet   atorvastatin (LIPITOR) 20 MG tablet   Other Relevant Orders   POCT HgB A1C (Completed)   Hemoglobin A1c   Microalbumin / creatinine urine ratio   Hepatic function panel     Other   Hyperuricemia    Stable renal function No acute gout exacerbation. Repeat uric acid in 43months       Relevant Orders   Basic metabolic panel   Uric acid   Mixed hyperlipidemia  Resume atorvastatin Repeat lipid panel in 68months       Relevant Medications   metoprolol succinate (TOPROL-XL) 25 MG 24 hr tablet   olmesartan (BENICAR) 5 MG tablet   atorvastatin (LIPITOR) 20 MG tablet   Other Relevant Orders   Hepatic function panel   Lipid panel   Obesity,  morbid, BMI 40.0-49.9 (HCC)   Relevant Medications   metFORMIN (GLUCOPHAGE XR) 750 MG 24 hr tablet   Other Visit Diagnoses     Neuropathy       Essential hypertension       Relevant Medications   metoprolol succinate (TOPROL-XL) 25 MG 24 hr tablet   olmesartan (BENICAR) 5 MG tablet   atorvastatin (LIPITOR) 20 MG tablet   Other Relevant Orders   Basic metabolic panel   Insomnia, unspecified type           Follow-up: Return in about 6 months (around 07/27/2021) for DM and HTN, hyperlipidemia (17mins).  Wilfred Lacy, NP

## 2021-01-25 NOTE — Assessment & Plan Note (Addendum)
Stable renal function No acute gout exacerbation. Repeat uric acid in 70months

## 2021-01-26 ENCOUNTER — Telehealth: Payer: Self-pay

## 2021-01-26 NOTE — Telephone Encounter (Signed)
Pt called about lab results for A1C. Please advise

## 2021-01-26 NOTE — Telephone Encounter (Signed)
Pt called again stating she missed a call and needs her A1C info.

## 2021-01-30 NOTE — Telephone Encounter (Signed)
Pt informed of her lab results on 01/26/21

## 2021-02-14 ENCOUNTER — Other Ambulatory Visit: Payer: Self-pay | Admitting: Hematology & Oncology

## 2021-02-16 NOTE — Telephone Encounter (Signed)
Rx filled on 01/25/21 with a quantity of 180, with instructions to take once daily. To soon for refill and patient needs an appointment before refill also. Documented and explained to pharmacy.

## 2021-04-17 ENCOUNTER — Other Ambulatory Visit: Payer: Self-pay | Admitting: Hematology & Oncology

## 2021-05-14 ENCOUNTER — Other Ambulatory Visit: Payer: Self-pay

## 2021-05-14 ENCOUNTER — Encounter: Payer: Self-pay | Admitting: Family Medicine

## 2021-05-14 ENCOUNTER — Ambulatory Visit (INDEPENDENT_AMBULATORY_CARE_PROVIDER_SITE_OTHER): Payer: BC Managed Care – PPO | Admitting: Family Medicine

## 2021-05-14 VITALS — BP 132/74 | HR 88 | Temp 98.0°F | Ht 63.0 in | Wt 211.1 lb

## 2021-05-14 DIAGNOSIS — I10 Essential (primary) hypertension: Secondary | ICD-10-CM

## 2021-05-14 DIAGNOSIS — R42 Dizziness and giddiness: Secondary | ICD-10-CM | POA: Diagnosis not present

## 2021-05-14 LAB — CBC WITH DIFFERENTIAL/PLATELET
Basophils Absolute: 0.1 10*3/uL (ref 0.0–0.1)
Basophils Relative: 1.1 % (ref 0.0–3.0)
Eosinophils Absolute: 0.1 10*3/uL (ref 0.0–0.7)
Eosinophils Relative: 1.3 % (ref 0.0–5.0)
HCT: 36.7 % (ref 36.0–46.0)
Hemoglobin: 12.7 g/dL (ref 12.0–15.0)
Lymphocytes Relative: 20.5 % (ref 12.0–46.0)
Lymphs Abs: 1.6 10*3/uL (ref 0.7–4.0)
MCHC: 34.7 g/dL (ref 30.0–36.0)
MCV: 83.8 fl (ref 78.0–100.0)
Monocytes Absolute: 0.3 10*3/uL (ref 0.1–1.0)
Monocytes Relative: 3.7 % (ref 3.0–12.0)
Neutro Abs: 5.6 10*3/uL (ref 1.4–7.7)
Neutrophils Relative %: 73.4 % (ref 43.0–77.0)
Platelets: 324 10*3/uL (ref 150.0–400.0)
RBC: 4.38 Mil/uL (ref 3.87–5.11)
RDW: 12.4 % (ref 11.5–15.5)
WBC: 7.7 10*3/uL (ref 4.0–10.5)

## 2021-05-14 LAB — BASIC METABOLIC PANEL
BUN: 13 mg/dL (ref 6–23)
CO2: 28 mEq/L (ref 19–32)
Calcium: 9.4 mg/dL (ref 8.4–10.5)
Chloride: 102 mEq/L (ref 96–112)
Creatinine, Ser: 0.77 mg/dL (ref 0.40–1.20)
GFR: 81.67 mL/min (ref >60.00)
Glucose, Bld: 226 mg/dL — ABNORMAL HIGH (ref 70–99)
Potassium: 3.4 mEq/L — ABNORMAL LOW (ref 3.5–5.1)
Sodium: 138 mEq/L (ref 135–145)

## 2021-05-14 MED ORDER — MECLIZINE HCL 25 MG PO TABS: 25.0000 mg | ORAL_TABLET | Freq: Three times a day (TID) | ORAL | 1 refills | Status: DC | PRN

## 2021-05-14 MED ORDER — FLUTICASONE PROPIONATE 50 MCG/ACT NA SUSP: 2.0000 | Freq: Every day | NASAL | 1 refills | Status: DC

## 2021-05-14 NOTE — Assessment & Plan Note (Signed)
Stable bp  Dizziness occurs with position change but not orthostatic

## 2021-05-14 NOTE — Progress Notes (Signed)
Subjective:    Patient ID: Melanie Alvarez, female    DOB: 1955-09-20, 65 y.o.   MRN: [id number]  This visit occurred during the SARS-CoV-2 public health emergency.  Safety protocols were in place, including screening questions prior to the visit, additional usage of staff PPE, and extensive cleaning of exam room while observing appropriate contact time as indicated for disinfecting solutions.   HPI 65 yo pt of [facility] presents for c/o dizziness She has a h/o HTN and pancreatic cancer with cirrhosis in the past along with DM  Wt Readings from Last 3 Encounters:  05/14/21 211 lb 2 oz (95.8 kg)  01/25/21 214 lb (97.1 kg)  10/26/20 214 lb (97.1 kg)   37.40 kg/m  Started Sunday Doing laundry after bkfast (bacon and eggs)  Bent down and stood back up (felt like she was spinning)  Felt like she could faint -dizzy/light headed  She sat down on the sofa  Told her husband an hour later   Went to the bathroom several hours later -dizzy again   In the shower this am - bend head back and dizzy   Does not sleep well at night 3-4 hours at most  Stress is not bad   No head trauma  No h/o dizziness in the past   Wonders if she has vertigo-she looked it up   Does have some nasal congestion recently  ? Allergies  She tries to clean her ears with alcohol/q tip No ear pain  Uses saline ns   She smokes 10 cig per week  Drinks etoh, about 40 oz of beer after work some days  Does not drink every day   Does not drink enough water  Thinks she is dehydrated for 2 weeks  She had had gatorade and some ginger root tea with honey  Unsure of caffeine    Medicines -nothing new   DM - labile/ but stable  No low blood sugar readings    HTN BP: 132/74  Taking amlodipine , metoprolol , olmesartan   Pulse Readings from Last 3 Encounters:  05/14/21 88  01/25/21 82  10/26/20 78   Patient Active Problem List   Diagnosis Date Noted   Dizziness 05/14/2021   Goals of care,  counseling/discussion 09/29/2019   Abnormal liver function 09/20/2019   Hyponatremia 09/20/2019   Malignant neoplasm of head of pancreas ([place]) 09/20/2019   Alcohol cessation counseling 08/11/2019   Exophthalmos of left eye 05/25/2019   Hyperuricemia 10/06/2018   Mixed hyperlipidemia 04/06/2018   Cirrhosis of liver ([place]) 12/26/2017   Need for prophylactic vaccination and inoculation against viral hepatitis 12/26/2017   Gouty arthritis 10/20/2017   Hypokalemia 10/20/2017   Hypertension 08/13/2012   Tobacco abuse 08/13/2012   Chronic hepatitis C without hepatic coma ([place]) 08/13/2012   Obesity 08/13/2012   DM (diabetes mellitus) ([place])    Past Medical History:  Diagnosis Date   Cancer ([place])    Cirrhosis ([place])    Diabetes mellitus 2012   Diverticulosis    Gallbladder sludge    Hepatitis 2010   history of Hepatitis C   Hepatitis C    Hypertension 2011   Obesity    Past Surgical History:  Procedure Laterality Date   BILIARY BRUSHING  09/22/2019   Procedure: BILIARY BRUSHING;  Surgeon: Irving Copas., MD;  Location: [facility];  Service: Gastroenterology;;   BILIARY DILATION  09/22/2019   Procedure: BILIARY DILATION;  Surgeon: Irving Copas., MD;  Location: [facility];  Service: Gastroenterology;;   BILIARY STENT PLACEMENT N/A 09/22/2019   Procedure: BILIARY STENT PLACEMENT;  Surgeon: Irving Copas., MD;  Location: [facility];  Service: Gastroenterology;  Laterality: N/A;   COLONOSCOPY  07/24/2011   Procedure: COLONOSCOPY;  Surgeon: Landry Dyke, MD;  Location: [facility];  Service: Endoscopy;  Laterality: N/A;   Dental Surgery Tooth Extraction     ERCP N/A 09/22/2019   Procedure: ENDOSCOPIC RETROGRADE CHOLANGIOPANCREATOGRAPHY (ERCP);  Surgeon: Irving Copas., MD;  Location: [facility];  Service: Gastroenterology;  Laterality: N/A;   ESOPHAGOGASTRODUODENOSCOPY (EGD) WITH PROPOFOL N/A 09/22/2019   Procedure: ESOPHAGOGASTRODUODENOSCOPY (EGD)  WITH PROPOFOL;  Surgeon: Rush Landmark Telford Nab., MD;  Location: [facility];  Service: Gastroenterology;  Laterality: N/A;   ESOPHAGOGASTRODUODENOSCOPY (EGD) WITH PROPOFOL N/A 09/25/2019   Procedure: ESOPHAGOGASTRODUODENOSCOPY (EGD) WITH PROPOFOL;  Surgeon: Rush Landmark Telford Nab., MD;  Location: [facility];  Service: Gastroenterology;  Laterality: N/A;   ESOPHAGOGASTRODUODENOSCOPY (EGD) WITH PROPOFOL N/A 01/19/2020   Procedure: ESOPHAGOGASTRODUODENOSCOPY (EGD) WITH PROPOFOL;  Surgeon: Rush Landmark Telford Nab., MD;  Location: [place];  Service: Gastroenterology;  Laterality: N/A;   EUS N/A 09/22/2019   Procedure: UPPER ENDOSCOPIC ULTRASOUND (EUS) RADIAL;  Surgeon: Irving Copas., MD;  Location: [facility];  Service: Gastroenterology;  Laterality: N/A;   EUS N/A 01/19/2020   Procedure: UPPER ENDOSCOPIC ULTRASOUND (EUS) RADIAL;  Surgeon: Irving Copas., MD;  Location: [place] [facility];  Service: Gastroenterology;  Laterality: N/A;   FIDUCIAL MARKER PLACEMENT N/A 01/19/2020   Procedure: FIDUCIAL MARKER PLACEMENT;  Surgeon: Irving Copas., MD;  Location: [place] [facility];  Service: Gastroenterology;  Laterality: N/A;   FINE NEEDLE ASPIRATION  09/22/2019   Procedure: FINE NEEDLE ASPIRATION (FNA) LINEAR;  Surgeon: Irving Copas., MD;  Location: [facility];  Service: Gastroenterology;;   FINE NEEDLE ASPIRATION  09/25/2019   Procedure: FINE NEEDLE ASPIRATION (FNA) RADIAL;  Surgeon: Irving Copas., MD;  Location: [facility];  Service: Gastroenterology;;   HEMOSTASIS CLIP PLACEMENT  09/25/2019   Procedure: HEMOSTASIS CLIP PLACEMENT;  Surgeon: Irving Copas., MD;  Location: [facility];  Service: Gastroenterology;;   IR IMAGING GUIDED PORT INSERTION  09/24/2019   IR REMOVAL TUN ACCESS W/ PORT W/O FL MOD SED  06/16/2020   SPHINCTEROTOMY  09/22/2019   Procedure: Joan Mayans;  Surgeon: Mansouraty, Telford Nab., MD;  Location: [facility];  Service: Gastroenterology;;    UPPER ESOPHAGEAL ENDOSCOPIC ULTRASOUND (EUS) N/A 09/25/2019   Procedure: UPPER ESOPHAGEAL ENDOSCOPIC ULTRASOUND (EUS);  Surgeon: Irving Copas., MD;  Location: [facility];  Service: Gastroenterology;  Laterality: N/A;   Social History   Tobacco Use   Smoking status: Every Day    Packs/day: 0.25    Types: Cigarettes    Last attempt to quit: 09/20/2019    Years since quitting: 1.6   Smokeless tobacco: Never  Vaping Use   Vaping Use: Never used  Substance Use Topics   Alcohol use: Yes    Alcohol/week: 30.0 standard drinks    Types: 24 Cans of beer, 6 Shots of liquor per week    Comment: weekends   Drug use: No    Comment: previously    Family History  Problem Relation Age of Onset   Diabetes Sister    Cancer Maternal Grandmother        leukemia    Diabetes Maternal Grandfather    Diabetes Paternal Grandfather    Diabetes Paternal Grandmother    Hypertension Sister    Hypertension Brother    Anesthesia problems Neg Hx    Allergies  Allergen Reactions   Lisinopril Swelling    Facial and upper lip   Current Outpatient Medications on File Prior to Visit  Medication Sig Dispense Refill   amLODipine (NORVASC) 10 MG tablet TAKE 1 TABLET BY MOUTH AT BEDTIME 90 tablet 3   atorvastatin (LIPITOR) 20 MG tablet Take 1 tablet (20 mg total) by mouth at bedtime. 90 tablet 3   calcium-vitamin D (OSCAL WITH D) 500-200 MG-UNIT tablet Take 1 tablet by mouth.     CREON 36000-114000 units CPEP capsule TAKE 2 CAPSULES BY MOUTH THREE TIMES DAILY BEFORE MEAL(S) 180 capsule 0   glucose blood test strip Test blood glucose once daily. One Touch Verio test strips 100 each 6   metFORMIN (GLUCOPHAGE XR) 750 MG 24 hr tablet Take 1 tablet (750 mg total) by mouth daily with breakfast. 180 tablet 1   metoprolol succinate (TOPROL-XL) 25 MG 24 hr tablet TAKE 1 TABLET BY MOUTH ONCE DAILY 90 tablet 3   olmesartan (BENICAR) 5 MG tablet Take 1 tablet (5 mg total) by mouth daily. 90 tablet 3    pantoprazole (PROTONIX) 40 MG tablet Take 1 tablet (40 mg total) by mouth 2 (two) times daily. 108 tablet 0   No current facility-administered medications on file prior to visit.   Review of Systems  Constitutional:  Negative for chills, fever and malaise/fatigue.  HENT:  Negative for congestion, ear pain, sinus pain and sore throat.   Eyes:  Negative for blurred vision, discharge and redness.  Respiratory:  Negative for cough, shortness of breath and stridor.   Cardiovascular:  Negative for chest pain, palpitations and leg swelling.  Gastrointestinal:  Negative for abdominal pain, diarrhea, nausea and vomiting.  Musculoskeletal:  Negative for myalgias.  Skin:  Negative for rash.  Neurological:  Positive for dizziness. Negative for tingling, tremors, sensory change, speech change, focal weakness, seizures, loss of consciousness and headaches.  Psychiatric/Behavioral:  Negative for depression. The patient is not nervous/anxious.      Review of Systems  Constitutional:  Negative for chills, fever and malaise/fatigue.  HENT:  Negative for congestion, ear pain, sinus pain and sore throat.   Eyes:  Negative for blurred vision, discharge and redness.  Respiratory:  Negative for cough, shortness of breath and stridor.   Cardiovascular:  Negative for chest pain, palpitations and leg swelling.  Gastrointestinal:  Negative for abdominal pain, diarrhea, nausea and vomiting.  Musculoskeletal:  Negative for myalgias.  Skin:  Negative for rash.  Neurological:  Positive for dizziness. Negative for tingling, tremors, sensory change, speech change, focal weakness, seizures, loss of consciousness and headaches.  Psychiatric/Behavioral:  Negative for depression. The patient is not nervous/anxious.       Objective:   Physical Exam Constitutional:      General: She is not in acute distress.    Appearance: Normal appearance. She is well-developed. She is obese. She is not ill-appearing or diaphoretic.   HENT:     Head: Normocephalic and atraumatic.     Right Ear: Tympanic membrane, ear canal and external ear normal.     Left Ear: Tympanic membrane, ear canal and external ear normal.     Ears:     Comments: Some scarring on TMs    Nose: Nose normal.     Mouth/Throat:     Pharynx: No oropharyngeal exudate.  Eyes:     General: No scleral icterus.       Right eye: No discharge.        Left eye: No discharge.  Conjunctiva/sclera: Conjunctivae normal.     Pupils: Pupils are equal, round, and reactive to light.     Comments: 2-3 beats of horizontal nystagmus bilat  This causes some dizziness  Neck:     Thyroid: No thyromegaly.     Vascular: No carotid bruit or JVD.     Trachea: No tracheal deviation.  Cardiovascular:     Rate and Rhythm: Normal rate and regular rhythm.     Heart sounds: Murmur heard.  Pulmonary:     Effort: Pulmonary effort is normal. No respiratory distress.     Breath sounds: Normal breath sounds. No wheezing or rales.  Abdominal:     General: There is no distension.     Tenderness: There is no abdominal tenderness.  Musculoskeletal:        General: No tenderness.     Cervical back: Full passive range of motion without pain, normal range of motion and neck supple. No tenderness.     Right lower leg: No edema.     Left lower leg: No edema.     Comments: Per pt - thinks her L ankle is always a little swollen     Lymphadenopathy:     Cervical: No cervical adenopathy.  Skin:    General: Skin is warm and dry.     Coloration: Skin is not jaundiced or pale.     Findings: No bruising, erythema, lesion or rash.     Comments: Some lentigines   Neurological:     Mental Status: She is alert and oriented to person, place, and time.     Cranial Nerves: Cranial nerves are intact. No cranial nerve deficit, dysarthria or facial asymmetry.     Sensory: Sensation is intact. No sensory deficit.     Motor: Motor function is intact. No weakness, tremor, atrophy or  abnormal muscle tone.     Coordination: Romberg sign negative. Coordination normal.     Gait: Gait normal.     Deep Tendon Reflexes: Reflexes are normal and symmetric.     Comments: No focal cerebellar signs   Gait is slow due to dizziness Rhomberg test-normal   Pt feels dizzy when closing her eyes and no lateral gaze  Pt gets dizzy with any change in position  Needed asst to get on table   Psychiatric:        Behavior: Behavior normal.        Thought Content: Thought content normal.          Assessment & Plan:   Problem List Items Addressed This Visit       Cardiovascular and Mediastinum   Hypertension    Stable bp  Dizziness occurs with position change but not orthostatic         Relevant Orders   CBC with Differential/Platelet   Basic metabolic panel     Other   Dizziness - Primary    New and positional (started after bending and twisting0  Describes as a spinning sensation  Nystagmus bilat noted -otherwise reassuring exam Disc stressors  Tends to be dehydrated  Suspect benign positional vertigo Adv to inc water to 64 oz daily and avoid etoh  Also quit smoking flonase ns for allergy congestion  meclinzine 25 mg prn dizziness with caution of sedation  Given handouts on vertigo and epely maneuver Disc fall prevention  Lab today  Will check back after that Work note until thursday      Relevant Orders   CBC with Differential/Platelet   Basic  metabolic panel

## 2021-05-14 NOTE — Assessment & Plan Note (Signed)
New and positional (started after bending and twisting0  Describes as a spinning sensation  Nystagmus bilat noted -otherwise reassuring exam Disc stressors  Tends to be dehydrated  Suspect benign positional vertigo Adv to inc water to 64 oz daily and avoid etoh  Also quit smoking flonase ns for allergy congestion  meclinzine 25 mg prn dizziness with caution of sedation  Given handouts on vertigo and epely maneuver Disc fall prevention  Lab today  Will check back after that Work note until thursday

## 2021-05-14 NOTE — Patient Instructions (Addendum)
Aim for 64 oz of water a day  This may help you feel better   Labs today   You may have some vertigo   Try meclizine as needed (with caution of sedation) as needed for dizziness This can cause sleepiness and dry mouth   Drink more fluids   Try flonase nasal spray to help with congestion  2 sprays on each side once daily   Don't change position quickly  Use help to prevent falls if you need it   If suddenly severe or if a bad headache or any weakness or facial droop or numbness- get to the ER/call EMS  Update [place] on Wednesday re: how you are

## 2021-06-05 ENCOUNTER — Encounter: Payer: Self-pay | Admitting: *Deleted

## 2021-06-19 ENCOUNTER — Telehealth: Payer: Self-pay

## 2021-06-19 ENCOUNTER — Telehealth: Payer: Self-pay | Admitting: Nurse Practitioner

## 2021-06-19 NOTE — Telephone Encounter (Signed)
Pt called in and LM stating that she has abdominal pain and would like a CB.

## 2021-06-19 NOTE — Telephone Encounter (Signed)
Pt called in wanting to speak with [facility]. She is having stomach pain and she was dx with pancreatic cancer last year. I transferred her over to Nurse Triage, she did hang up. They were going to call her back.

## 2021-06-19 NOTE — Telephone Encounter (Signed)
LMTCB

## 2021-06-20 NOTE — Telephone Encounter (Signed)
LVM for patient to return call. 

## 2021-06-25 NOTE — Telephone Encounter (Signed)
LMTCB

## 2021-06-28 ENCOUNTER — Encounter: Payer: Self-pay | Admitting: Hematology & Oncology

## 2021-06-28 NOTE — Telephone Encounter (Signed)
Spoke with pt who states she has had abdominal pain x 1 week. Pain location is left side. Pt has also had diarrhea intermittently with the abdominal pain. Declines fever, SOB, nausea, vomiting or diarrhea. Pt states pain is ok right now and she is taking Tylenol at bedtime but no other OTC meds. Pt wanted to know what meds she can take OTC to help with pain. Pt's PCP advised her to check with Dr Marin Olp due to pancreatic Dx.  Per Dr Marin Olp last CT scan was 12/2020 and needs to be repeated. Recommend taking Pepto and will call to schedule scan. Pt advised and states understanding.

## 2021-07-04 NOTE — Telephone Encounter (Signed)
Called patient again and still no answer or reply to voicemails left. Closing encounter.

## 2021-07-10 ENCOUNTER — Other Ambulatory Visit: Payer: Self-pay | Admitting: Hematology & Oncology

## 2021-07-13 ENCOUNTER — Other Ambulatory Visit: Payer: Self-pay | Admitting: Hematology & Oncology

## 2021-07-14 ENCOUNTER — Encounter: Payer: Self-pay | Admitting: Hematology & Oncology

## 2021-08-21 ENCOUNTER — Ambulatory Visit: Payer: Self-pay

## 2021-08-31 ENCOUNTER — Ambulatory Visit: Payer: BC Managed Care – PPO | Admitting: Family Medicine

## 2021-09-04 ENCOUNTER — Ambulatory Visit (INDEPENDENT_AMBULATORY_CARE_PROVIDER_SITE_OTHER)
Admission: RE | Admit: 2021-09-04 | Discharge: 2021-09-04 | Disposition: A | Discharge status: Home / Self Care | Payer: BC Managed Care – PPO | Source: Ambulatory Visit | Attending: Family Medicine | Admitting: Family Medicine

## 2021-09-04 ENCOUNTER — Ambulatory Visit (INDEPENDENT_AMBULATORY_CARE_PROVIDER_SITE_OTHER): Payer: BC Managed Care – PPO | Admitting: Family Medicine

## 2021-09-04 ENCOUNTER — Encounter: Payer: Self-pay | Admitting: Family Medicine

## 2021-09-04 ENCOUNTER — Other Ambulatory Visit: Payer: Self-pay

## 2021-09-04 VITALS — BP 130/78 | HR 78 | Temp 97.6°F | Ht 63.0 in | Wt 200.2 lb

## 2021-09-04 DIAGNOSIS — M109 Gout, unspecified: Secondary | ICD-10-CM

## 2021-09-04 DIAGNOSIS — M79672 Pain in left foot: Secondary | ICD-10-CM

## 2021-09-04 DIAGNOSIS — M25572 Pain in left ankle and joints of left foot: Secondary | ICD-10-CM

## 2021-09-04 DIAGNOSIS — M25579 Pain in unspecified ankle and joints of unspecified foot: Secondary | ICD-10-CM | POA: Insufficient documentation

## 2021-09-04 DIAGNOSIS — M79673 Pain in unspecified foot: Secondary | ICD-10-CM | POA: Insufficient documentation

## 2021-09-04 LAB — URIC ACID: Uric Acid, Serum: 6.1 mg/dL (ref 2.4–7.0)

## 2021-09-04 LAB — BASIC METABOLIC PANEL
BUN: 12 mg/dL (ref 6–23)
CO2: 29 mEq/L (ref 19–32)
Calcium: 9.2 mg/dL (ref 8.4–10.5)
Chloride: 97 mEq/L (ref 96–112)
Creatinine, Ser: 0.71 mg/dL (ref 0.40–1.20)
GFR: 89.82 mL/min (ref >60.00)
Glucose, Bld: 221 mg/dL — ABNORMAL HIGH (ref 70–99)
Potassium: 3.1 mEq/L — ABNORMAL LOW (ref 3.5–5.1)
Sodium: 135 mEq/L (ref 135–145)

## 2021-09-04 IMAGING — DX DG ANKLE COMPLETE 3+V*L*
3 series · 3 of 3 positions shown · non-contrast
Comparison: None.

CLINICAL DATA: Left ankle pain and swelling.

EXAM:
LEFT ANKLE COMPLETE - 3+ VIEW

[ankle ap]
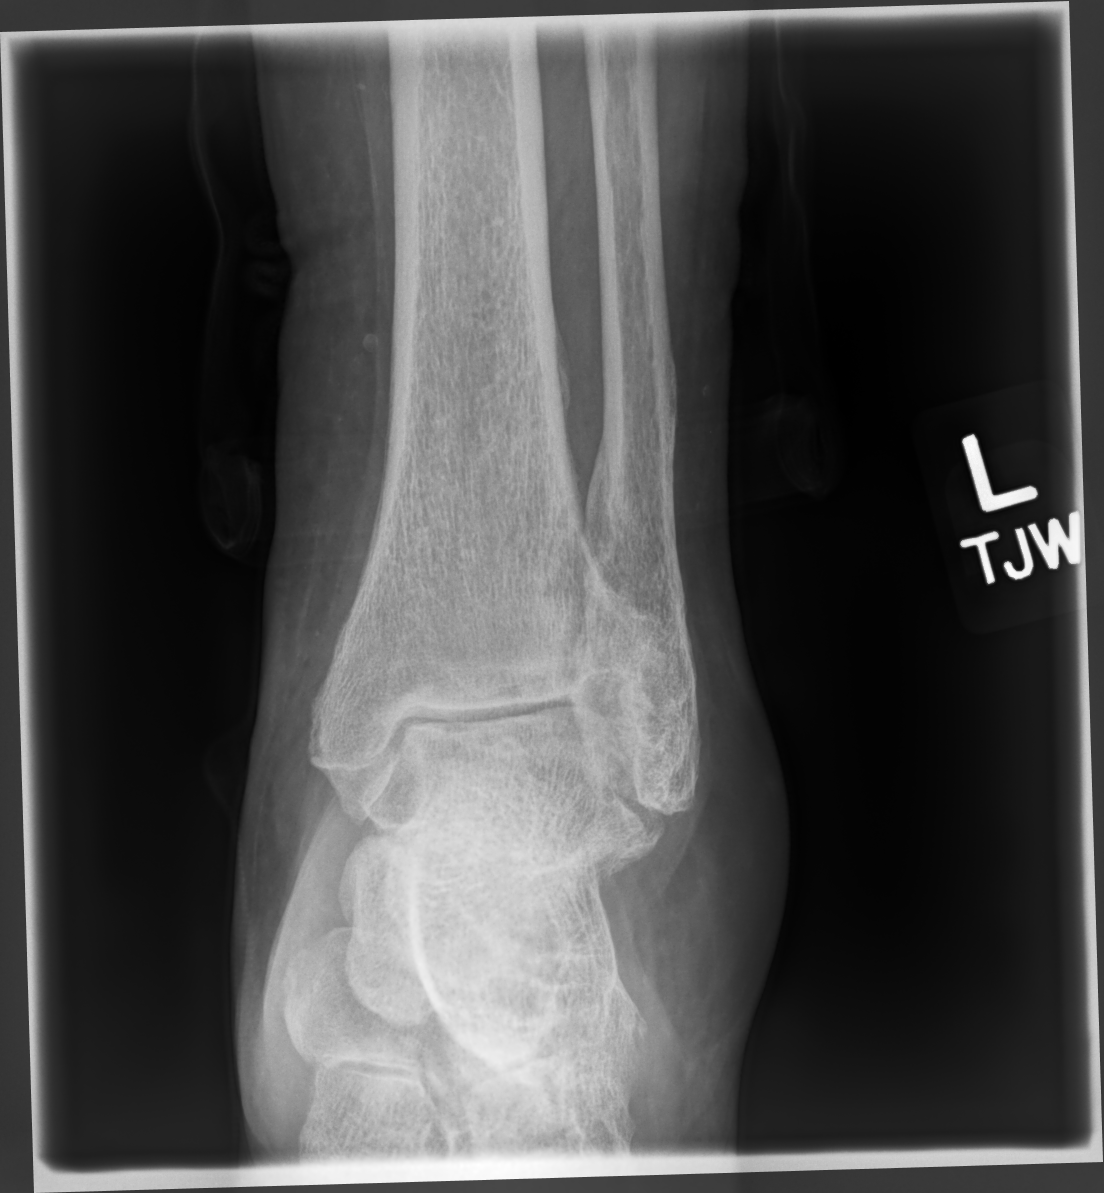

[ankle mlo]
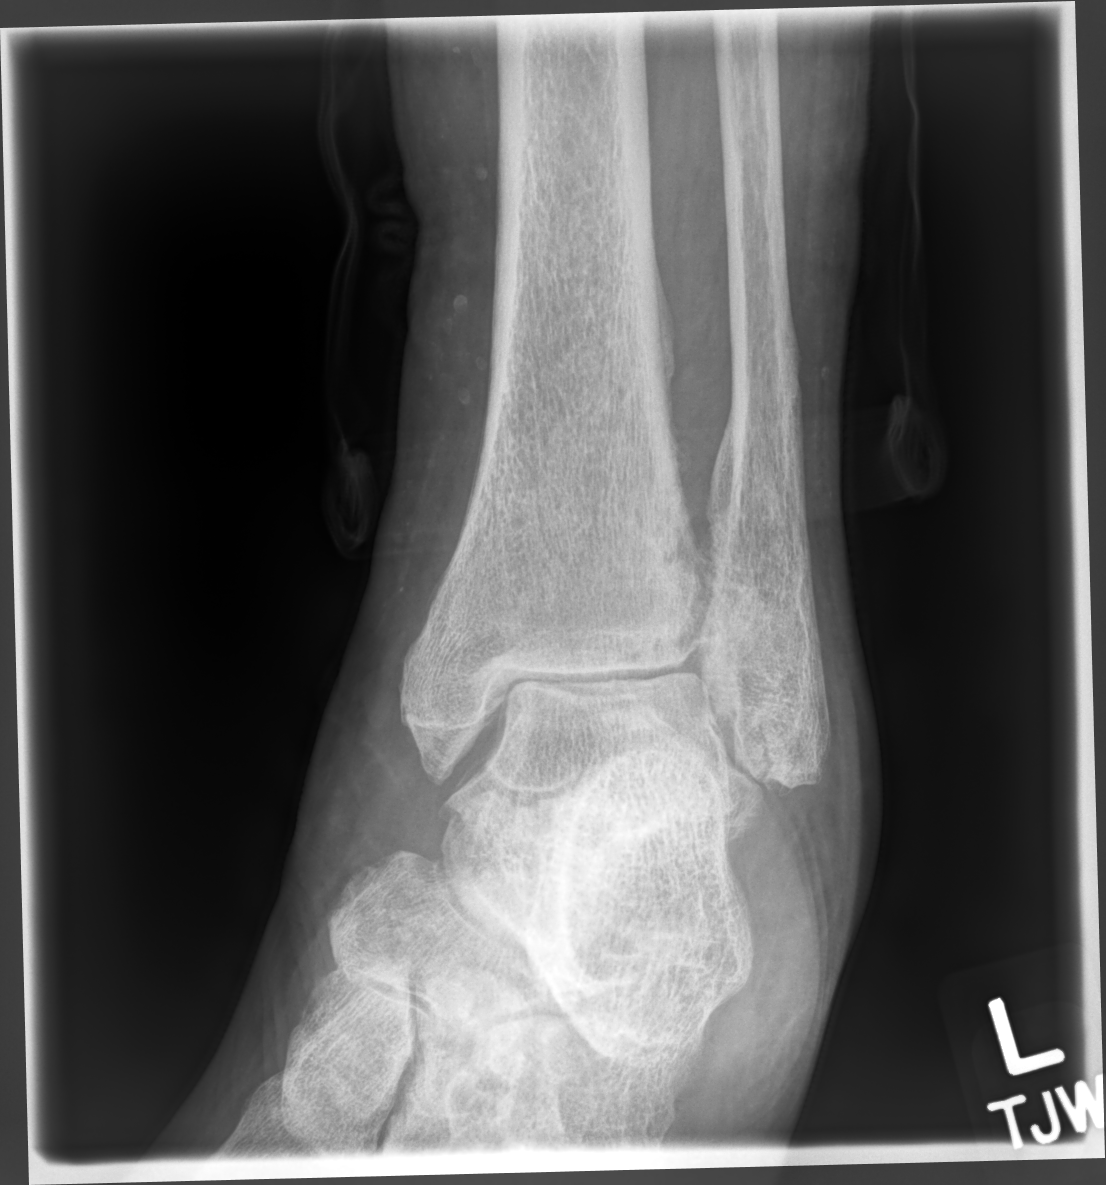

[ankle lat]
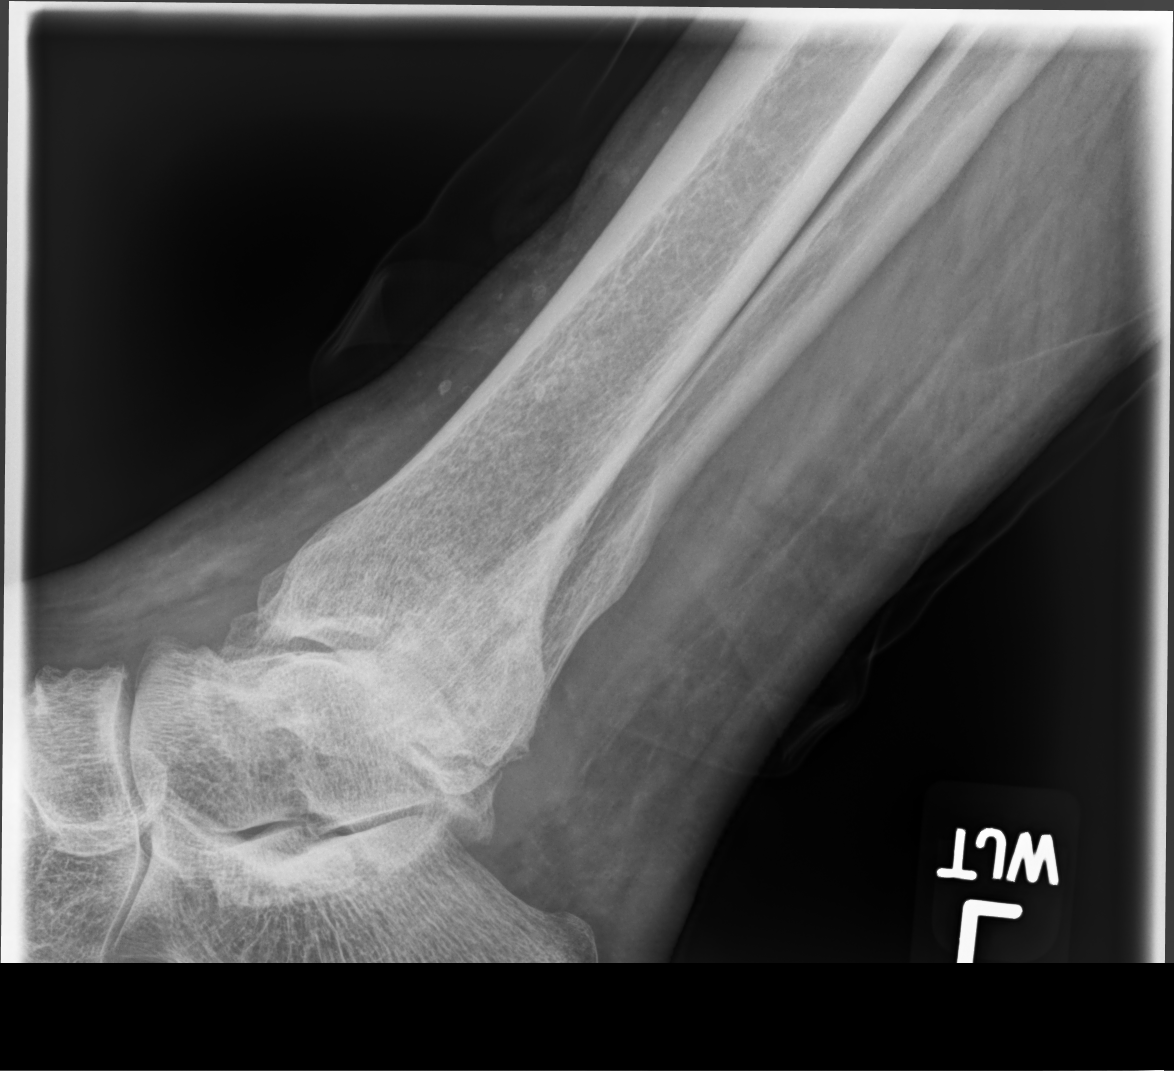

[3 of 3 positions shown; findings below may reference images not displayed]

FINDINGS: Generalized osteopenia. No acute fracture or dislocation. Old healed
fracture of the distal fibular metaphysis. Mild tibiotalar joint
space narrowing and marginal osteophytes. No aggressive osseous
lesion. Normal alignment.

Soft tissue swelling around the ankle. No radiopaque foreign body or
soft tissue emphysema.
IMPRESSION: No acute osseous injury of the left ankle.

## 2021-09-04 IMAGING — DX DG FOOT COMPLETE 3+V*L*
3 series · 3 of 3 positions shown · non-contrast
Comparison: None.

CLINICAL DATA: Swelling and pain in the left ankle and foot.

EXAM:
LEFT FOOT - COMPLETE 3+ VIEW

[foot ap]
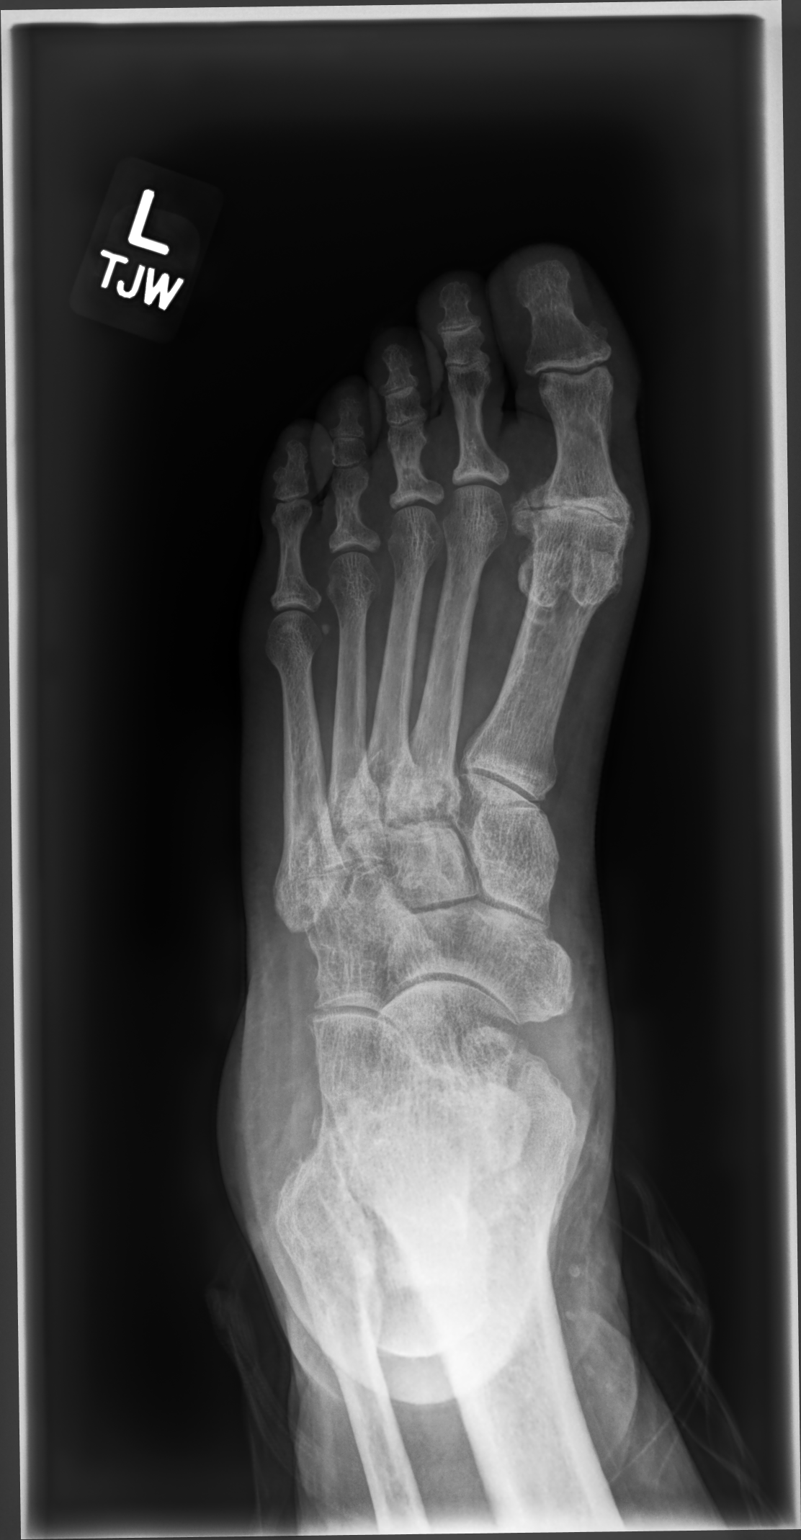

[foot mlo]
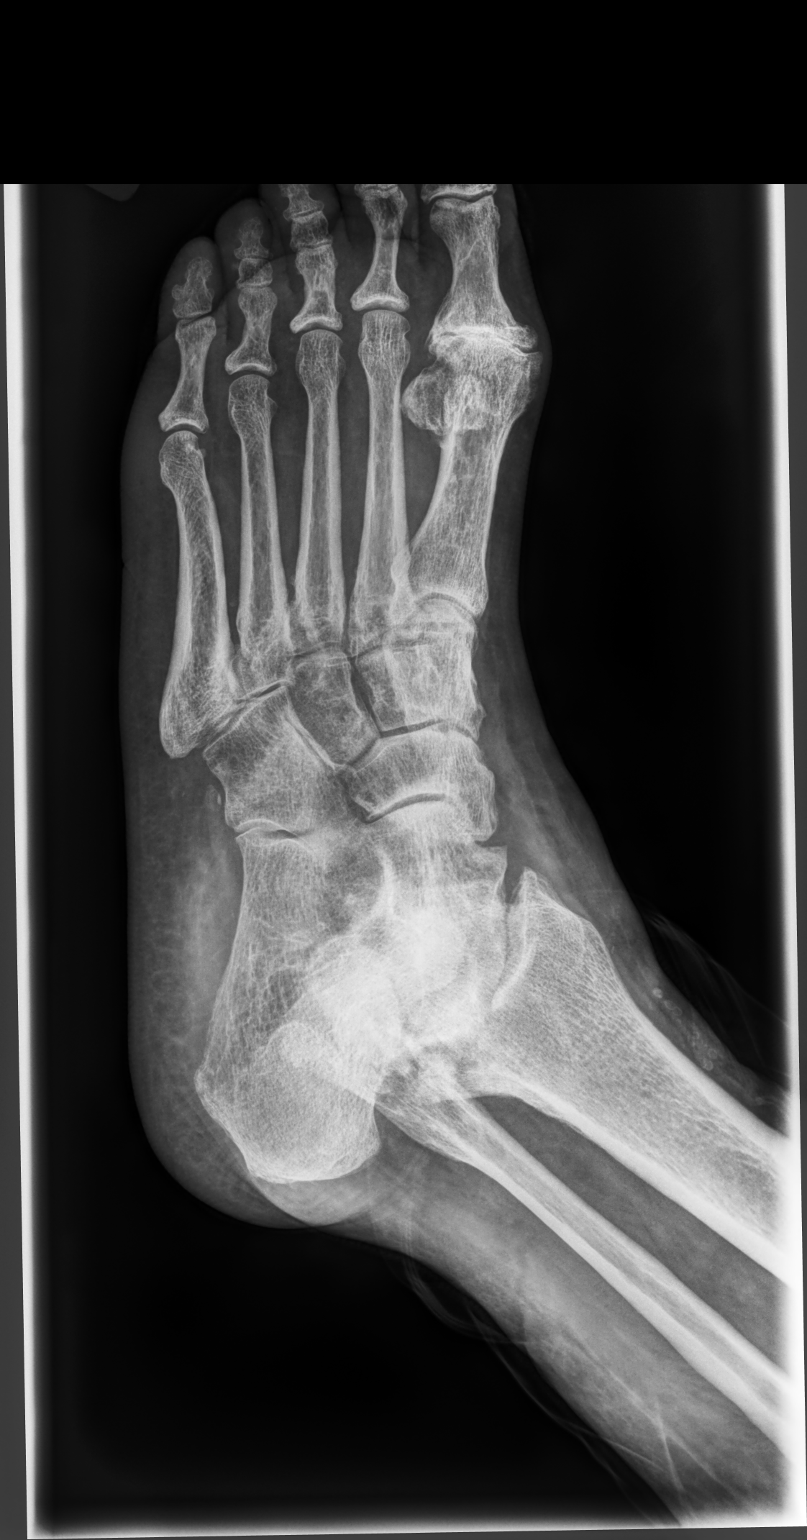

[foot lat]
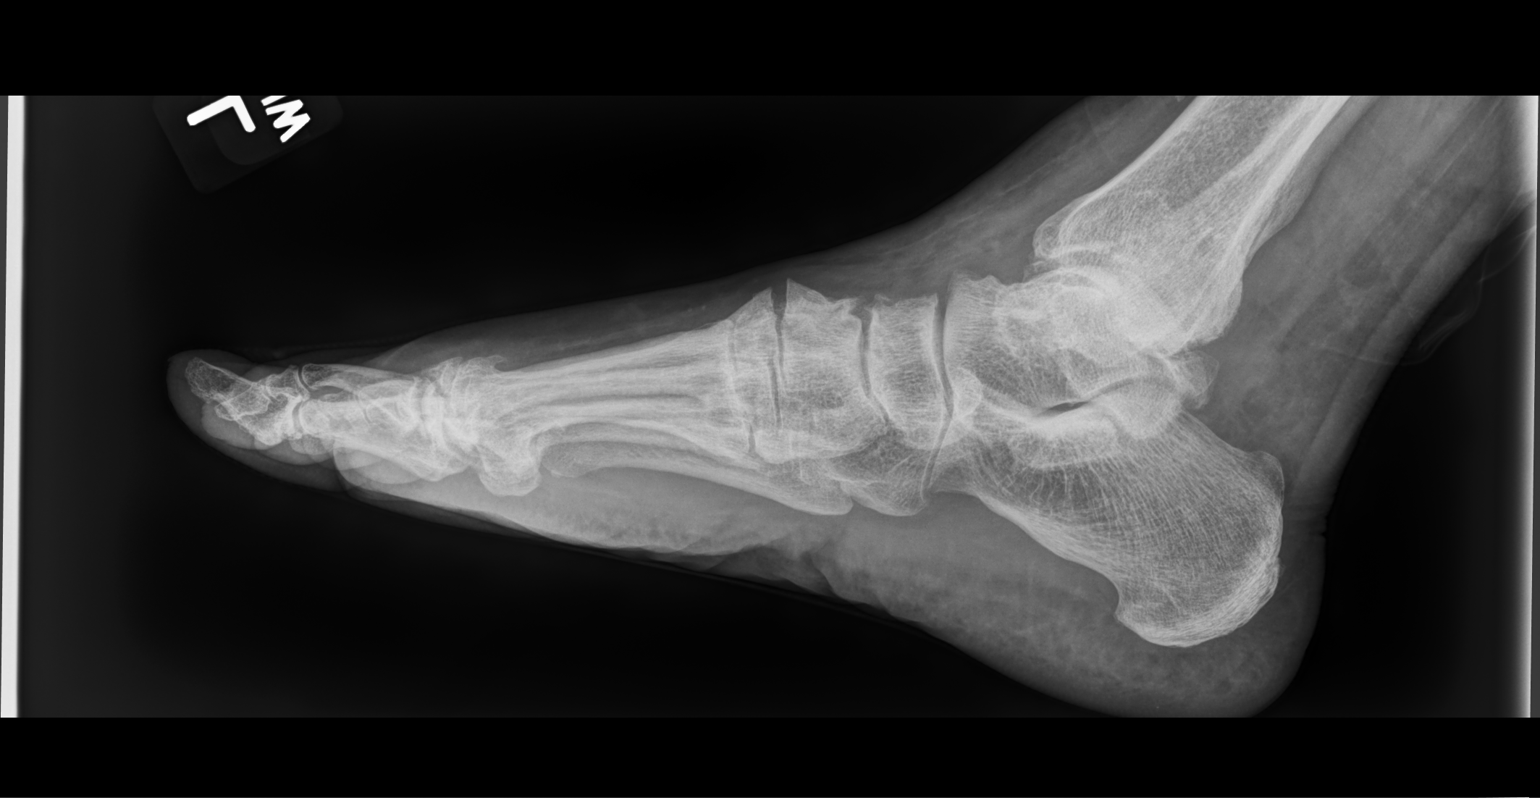

[3 of 3 positions shown; findings below may reference images not displayed]

FINDINGS: There is no evidence of fracture or dislocation. There is marked
narrowing of the first metatarsophalangeal joint with marginal
osteophytes. No periarticular erosion or significant soft tissue
swelling concerning for acute gouty arthritis. There is tibiotalar
joint space narrowing with subchondral sclerosis concerning for
degenerative/inflammatory arthropathy. There is also talonavicular
and calcaneonavicular joint space narrowing with marginal
osteophytes. Mild soft tissue swelling about the ankle.
IMPRESSION: 1. Moderate to severe osteoarthritis of the first
metatarsophalangeal joint, tibiotalar joint and multiple midfoot
joints including talonavicular joints. This could be degenerative
arthropathy in the settings of prior inflammatory arthropathy
including gout. Clinical correlation is suggested. MRI examination
could be considered for further evaluation of tendons, ligaments and
synovial inflammation if clinically warranted.

2.  No acute fracture or dislocation.

## 2021-09-04 MED ORDER — PREDNISONE 10 MG PO TABS: ORAL_TABLET | ORAL | 0 refills | Status: DC

## 2021-09-04 NOTE — Progress Notes (Signed)
Subjective:    Patient ID: Melanie Alvarez, female    DOB: 1956-01-14, 66 y.o.   MRN: [id number]  This visit occurred during the SARS-CoV-2 public health emergency.  Safety protocols were in place, including screening questions prior to the visit, additional usage of staff PPE, and extensive cleaning of exam room while observing appropriate contact time as indicated for disinfecting solutions.   HPI 66 yo pt of Dr Lorayne Marek presents with ankle swelling on the left   Wt Readings from Last 3 Encounters:  09/04/21 200 lb 4 oz (90.8 kg)  05/14/21 211 lb 2 oz (95.8 kg)  01/25/21 214 lb (97.1 kg)   35.47 kg/m  Pt has h/o cirrhosis, HTN, pancreatic neoplasm and DM2 as well as smoking and hyperuricemia  Record reviewed today including history/imaging and most recent labs   Symptoms started around august  Works on cement floors (is a Scientist, water quality in [employer])  Some left sided ankle swelling  (medial ankle)  No trauma known (recent or distant)  It hurts (she takes tylenol even though she shouldn't) Has to wear loose slippers Sometimes feels warm to the touch Not red however   This does not feel like gout (has had that before in her foot)  Has a h/o high uric acid  Lab Results  Component Value Date   LABURIC 4.9 06/30/2019  Used to take "pills for gout" until oncology stopped it   No fever  Feels ok except for this  Ate pigs feet recently  Not drinking enough fluids   Alcohol -occ drinks beer -not every day  Had some the day she ate pigs feet  Does not binge drink    HTN bp is stable today  No cp or palpitations or headaches or edema  No side effects to medicines  BP Readings from Last 3 Encounters:  09/04/21 130/78  05/14/21 132/74  01/25/21 130/76     Pulse Readings from Last 3 Encounters:  09/04/21 78  05/14/21 88  01/25/21 82    Amlodipine 10 mg daily Metoprolol xl 25 mg daily Benicar 5 mg daily    Lab Results  Component Value Date   CREATININE 0.77  05/14/2021   BUN 13 05/14/2021   NA 138 05/14/2021   K 3.4 (L) 05/14/2021   CL 102 05/14/2021   CO2 28 05/14/2021  GFR 81.6 Lab Results  Component Value Date   ALT 13 12/21/2020   AST 13 (L) 12/21/2020   GGT 69 (H) 11/25/2017   ALKPHOS 61 12/21/2020   BILITOT 0.4 12/21/2020    Lab Results  Component Value Date   HGBA1C 7.5 (A) 01/25/2021   Patient Active Problem List   Diagnosis Date Noted   Ankle pain 09/04/2021   Foot pain 09/04/2021   Dizziness 05/14/2021   Goals of care, counseling/discussion 09/29/2019   Abnormal liver function 09/20/2019   Hyponatremia 09/20/2019   Malignant neoplasm of head of pancreas ([place]) 09/20/2019   Alcohol cessation counseling 08/11/2019   Exophthalmos of left eye 05/25/2019   Hyperuricemia 10/06/2018   Mixed hyperlipidemia 04/06/2018   Cirrhosis of liver ([place]) 12/26/2017   Need for prophylactic vaccination and inoculation against viral hepatitis 12/26/2017   Gouty arthritis 10/20/2017   Hypokalemia 10/20/2017   Hypertension 08/13/2012   Tobacco abuse 08/13/2012   Chronic hepatitis C without hepatic coma ([place]) 08/13/2012   Obesity 08/13/2012   DM (diabetes mellitus) ([place])    Past Medical History:  Diagnosis Date   Cancer ([place])  Cirrhosis ([place])    Diabetes mellitus 2012   Diverticulosis    Gallbladder sludge    Hepatitis 2010   history of Hepatitis C   Hepatitis C    Hypertension 2011   Obesity    Past Surgical History:  Procedure Laterality Date   BILIARY BRUSHING  09/22/2019   Procedure: BILIARY BRUSHING;  Surgeon: Rush Landmark Telford Nab., MD;  Location: [facility];  Service: Gastroenterology;;   BILIARY DILATION  09/22/2019   Procedure: BILIARY DILATION;  Surgeon: Irving Copas., MD;  Location: [facility];  Service: Gastroenterology;;   BILIARY STENT PLACEMENT N/A 09/22/2019   Procedure: BILIARY STENT PLACEMENT;  Surgeon: Irving Copas., MD;  Location: [facility];  Service: Gastroenterology;  Laterality:  N/A;   COLONOSCOPY  07/24/2011   Procedure: COLONOSCOPY;  Surgeon: Landry Dyke, MD;  Location: [facility];  Service: Endoscopy;  Laterality: N/A;   Dental Surgery Tooth Extraction     ERCP N/A 09/22/2019   Procedure: ENDOSCOPIC RETROGRADE CHOLANGIOPANCREATOGRAPHY (ERCP);  Surgeon: Irving Copas., MD;  Location: [facility];  Service: Gastroenterology;  Laterality: N/A;   ESOPHAGOGASTRODUODENOSCOPY (EGD) WITH PROPOFOL N/A 09/22/2019   Procedure: ESOPHAGOGASTRODUODENOSCOPY (EGD) WITH PROPOFOL;  Surgeon: Rush Landmark Telford Nab., MD;  Location: [facility];  Service: Gastroenterology;  Laterality: N/A;   ESOPHAGOGASTRODUODENOSCOPY (EGD) WITH PROPOFOL N/A 09/25/2019   Procedure: ESOPHAGOGASTRODUODENOSCOPY (EGD) WITH PROPOFOL;  Surgeon: Rush Landmark Telford Nab., MD;  Location: [facility];  Service: Gastroenterology;  Laterality: N/A;   ESOPHAGOGASTRODUODENOSCOPY (EGD) WITH PROPOFOL N/A 01/19/2020   Procedure: ESOPHAGOGASTRODUODENOSCOPY (EGD) WITH PROPOFOL;  Surgeon: Rush Landmark Telford Nab., MD;  Location: [facility];  Service: Gastroenterology;  Laterality: N/A;   EUS N/A 09/22/2019   Procedure: UPPER ENDOSCOPIC ULTRASOUND (EUS) RADIAL;  Surgeon: Irving Copas., MD;  Location: [facility];  Service: Gastroenterology;  Laterality: N/A;   EUS N/A 01/19/2020   Procedure: UPPER ENDOSCOPIC ULTRASOUND (EUS) RADIAL;  Surgeon: Irving Copas., MD;  Location: [facility];  Service: Gastroenterology;  Laterality: N/A;   FIDUCIAL MARKER PLACEMENT N/A 01/19/2020   Procedure: FIDUCIAL MARKER PLACEMENT;  Surgeon: Irving Copas., MD;  Location: [facility];  Service: Gastroenterology;  Laterality: N/A;   FINE NEEDLE ASPIRATION  09/22/2019   Procedure: FINE NEEDLE ASPIRATION (FNA) LINEAR;  Surgeon: Irving Copas., MD;  Location: [facility];  Service: Gastroenterology;;   FINE NEEDLE ASPIRATION  09/25/2019   Procedure: FINE NEEDLE ASPIRATION (FNA) RADIAL;  Surgeon: Irving Copas., MD;  Location: [facility];  Service: Gastroenterology;;   HEMOSTASIS CLIP PLACEMENT  09/25/2019   Procedure: HEMOSTASIS CLIP PLACEMENT;  Surgeon: Irving Copas., MD;  Location: [facility];  Service: Gastroenterology;;   IR IMAGING GUIDED PORT INSERTION  09/24/2019   IR REMOVAL TUN ACCESS W/ PORT W/O FL MOD SED  06/16/2020   SPHINCTEROTOMY  09/22/2019   Procedure: Joan Mayans;  Surgeon: Mansouraty, Telford Nab., MD;  Location: [facility];  Service: Gastroenterology;;   UPPER ESOPHAGEAL ENDOSCOPIC ULTRASOUND (EUS) N/A 09/25/2019   Procedure: UPPER ESOPHAGEAL ENDOSCOPIC ULTRASOUND (EUS);  Surgeon: Irving Copas., MD;  Location: [facility];  Service: Gastroenterology;  Laterality: N/A;   Social History   Tobacco Use   Smoking status: Every Day    Packs/day: 0.25    Types: Cigarettes    Last attempt to quit: 09/20/2019    Years since quitting: 1.9   Smokeless tobacco: Never  Vaping Use   Vaping Use: Never used  Substance Use Topics   Alcohol use: Yes    Alcohol/week: 30.0 standard drinks  Types: 24 Cans of beer, 6 Shots of liquor per week    Comment: weekends   Drug use: No    Comment: previously    Family History  Problem Relation Age of Onset   Diabetes Sister    Cancer Maternal Grandmother        leukemia    Diabetes Maternal Grandfather    Diabetes Paternal Grandfather    Diabetes Paternal Grandmother    Hypertension Sister    Hypertension Brother    Anesthesia problems Neg Hx    Allergies  Allergen Reactions   Lisinopril Swelling    Facial and upper lip   Current Outpatient Medications on File Prior to Visit  Medication Sig Dispense Refill   amLODipine (NORVASC) 10 MG tablet TAKE 1 TABLET BY MOUTH AT BEDTIME 90 tablet 3   atorvastatin (LIPITOR) 20 MG tablet Take 1 tablet (20 mg total) by mouth at bedtime. 90 tablet 3   calcium-vitamin D (OSCAL WITH D) 500-200 MG-UNIT tablet Take 1 tablet by mouth.     CREON 36000-114000 units CPEP  capsule TAKE 2 CAPSULES BY MOUTH THREE TIMES DAILY BEFORE MEAL(S) 180 capsule 0   fluticasone (FLONASE) 50 MCG/ACT nasal spray Place 2 sprays into both nostrils daily. 16 g 1   glucose blood test strip Test blood glucose once daily. One Touch Verio test strips 100 each 6   metFORMIN (GLUCOPHAGE XR) 750 MG 24 hr tablet Take 1 tablet (750 mg total) by mouth daily with breakfast. 180 tablet 1   metoprolol succinate (TOPROL-XL) 25 MG 24 hr tablet TAKE 1 TABLET BY MOUTH ONCE DAILY 90 tablet 3   olmesartan (BENICAR) 5 MG tablet Take 1 tablet (5 mg total) by mouth daily. 90 tablet 3   meclizine (ANTIVERT) 25 MG tablet Take 1 tablet (25 mg total) by mouth 3 (three) times daily as needed for dizziness. (Patient not taking: Reported on 09/04/2021) 20 tablet 1   pantoprazole (PROTONIX) 40 MG tablet Take 1 tablet (40 mg total) by mouth 2 (two) times daily. 108 tablet 0   No current facility-administered medications on file prior to visit.     Review of Systems     Objective:   Physical Exam Constitutional:      General: She is not in acute distress.    Appearance: Normal appearance. She is obese. She is not ill-appearing or diaphoretic.  HENT:     Head: Normocephalic and atraumatic.  Eyes:     General: No scleral icterus.    Conjunctiva/sclera: Conjunctivae normal.     Pupils: Pupils are equal, round, and reactive to light.  Cardiovascular:     Rate and Rhythm: Normal rate and regular rhythm.     Heart sounds: Normal heart sounds.  Pulmonary:     Effort: Pulmonary effort is normal. No respiratory distress.     Breath sounds: Normal breath sounds. No wheezing.     Comments: Diffusely distant bs No crackles Musculoskeletal:     Cervical back: Neck supple. No tenderness.     Left ankle: Swelling present. No deformity or ecchymosis. Tenderness present over the medial malleolus. Decreased range of motion. Anterior drawer test negative. Normal pulse.     Left Achilles Tendon: Normal. Thompson's  test negative.     Left foot: Swelling present. Normal pulse.     Comments: Entire L ankle is tender to the touch, worse medially  No crepitus  Slight rubor Some mild tenderness of 1,2 metatarsals  Swelling noted/not pitting  More pain to  plantar flex than dorsiflex  More pain to ext rotate  Bears weight with a limp  Lymphadenopathy:     Cervical: No cervical adenopathy.  Skin:    General: Skin is warm and dry.     Coloration: Skin is not jaundiced or pale.     Findings: Erythema present. No bruising or rash.  Neurological:     Mental Status: She is alert.     Sensory: No sensory deficit.     Motor: No weakness.  Psychiatric:        Mood and Affect: Mood normal.          Assessment & Plan:   Problem List Items Addressed This Visit       Musculoskeletal and Integument   Gouty arthritis    Suspect current ankle pain may be gout (tender to the touch and with redness as well as swelling prior)  Bmet, uric acid today  Given handout on gout and also low purine diet  Counseled on fluid intake and encouraged not to drink alcohol  Xray of ankle and foot pending  Given hx of renal and liver issues  treatment of choice would be prednisone (although she is diabetic)      Relevant Orders   Basic metabolic panel   Uric acid     Other   Ankle pain - Primary    Acute on chronic (episodic since aug) in pt with h/o hyperuricemia and pancreatic cancer and liver problems Swelling (prior was red and warm)  Suspect gout but need to r/o bony abn  Xray ordered   May consider prednisone treatment       Relevant Orders   DG Ankle Complete Left   DG Foot Complete Left   Foot pain   Relevant Orders   DG Ankle Complete Left   DG Foot Complete Left

## 2021-09-04 NOTE — Patient Instructions (Signed)
Xrays now It is possible that you have a gout flare but I want to rule out a broken bone or injury   Once the xrays are read - we can make a plan  If this is gout I will likely start some prednisone   Drink fluids-work on that  Avoid alcohol  Elevate foot when you can  Use ice or heat-whatever feels best   Take a look at the handouts on gout and low purine diet

## 2021-09-04 NOTE — Assessment & Plan Note (Signed)
Acute on chronic (episodic since aug) in pt with h/o hyperuricemia and pancreatic cancer and liver problems Swelling (prior was red and warm)  Suspect gout but need to r/o bony abn  Xray ordered   May consider prednisone treatment

## 2021-09-04 NOTE — Progress Notes (Signed)
Left VM asking pt to call back.

## 2021-09-04 NOTE — Assessment & Plan Note (Signed)
Suspect current ankle pain may be gout (tender to the touch and with redness as well as swelling prior)  Bmet, uric acid today  Given handout on gout and also low purine diet  Counseled on fluid intake and encouraged not to drink alcohol  Xray of ankle and foot pending  Given hx of renal and liver issues  treatment of choice would be prednisone (although she is diabetic)

## 2021-09-05 ENCOUNTER — Other Ambulatory Visit: Payer: Self-pay | Admitting: Family Medicine

## 2021-09-05 ENCOUNTER — Telehealth: Payer: Self-pay | Admitting: Family Medicine

## 2021-09-05 NOTE — Telephone Encounter (Signed)
-----   Message from Loreen Freud, [place] sent at 09/05/2021  4:23 PM EST ----- Spoke to pt and relayed Dr. Alba Cory note. Pt states that she has not taken any potassium at all. Pt has started prednisone this morning.

## 2021-09-05 NOTE — Telephone Encounter (Signed)
I pended potassium 10 meq to send to her pharmacy of choice  Will need re check in 2 weeks for bmet  She needs to f/u with pcp, could be done at that time Cc to pcp

## 2021-09-05 NOTE — Telephone Encounter (Signed)
Pt returned call for lab results. Please call back. Best # [phone].

## 2021-09-06 MED ORDER — POTASSIUM CHLORIDE CRYS ER 10 MEQ PO TBCR: 10.0000 meq | EXTENDED_RELEASE_TABLET | Freq: Every day | ORAL | 0 refills | Status: DC

## 2021-09-06 NOTE — Telephone Encounter (Signed)
Pt notified Rx sent to pharmacy, she is at work and couldn't schedule any appts now but will call back this afternoon and get both appts scheduled. PCP is booked out so she may need to get on their cancellation list when she calls back

## 2021-09-06 NOTE — Telephone Encounter (Signed)
Spoke to pt and relayed results.

## 2021-09-14 ENCOUNTER — Ambulatory Visit: Payer: BC Managed Care – PPO | Admitting: Nurse Practitioner

## 2021-09-14 ENCOUNTER — Other Ambulatory Visit: Payer: Self-pay

## 2021-09-14 ENCOUNTER — Encounter: Payer: Self-pay | Admitting: Nurse Practitioner

## 2021-09-14 VITALS — BP 132/78 | HR 90 | Temp 97.0°F | Ht 63.0 in | Wt 199.2 lb

## 2021-09-14 DIAGNOSIS — E1169 Type 2 diabetes mellitus with other specified complication: Secondary | ICD-10-CM

## 2021-09-14 LAB — POCT GLYCOSYLATED HEMOGLOBIN (HGB A1C): Hemoglobin A1C: 9.9 % — AB (ref 4.0–5.6)

## 2021-09-14 MED ORDER — METFORMIN HCL ER 750 MG PO TB24: 750.0000 mg | ORAL_TABLET | Freq: Every day | ORAL | 1 refills | Status: DC

## 2021-09-14 MED ORDER — GLIPIZIDE ER 5 MG PO TB24: 5.0000 mg | ORAL_TABLET | Freq: Every day | ORAL | 1 refills | Status: DC

## 2021-09-14 NOTE — Assessment & Plan Note (Signed)
Uncontrolled, non fatsing glucose 225 Current use of metformin 750mg  XR daily She admits to high carb and glucose diet POC hgbA1c at 9.9% today  Advised about the importance of diet compliance. Provided printed information maintain metformin dose Add glipizide 5mg  XR F/up in 42months

## 2021-09-14 NOTE — Progress Notes (Signed)
Subjective:  Patient ID: Melanie Alvarez, female    DOB: 03-18-1956  Age: 66 y.o. MRN: [id number]  CC: Follow-up (6 month f/u on DM, HTN, and cholesterol. /Pt would also like to discuss recent labs done. /Pt is fasting. )   HPI Melanie Alvarez reports she was unable to complete oral prednisone after 4days. It caused mouth dryness, nausea and ABD pain. Symptoms resolved after discontinuation of med. She was also concerned about resuming potassium supplement.  DM (diabetes mellitus) ([place]) Uncontrolled, non fatsing glucose 225 Current use of metformin 750mg  XR daily She admits to high carb and glucose diet POC hgbA1c at 9.9% today  Advised about the importance of diet compliance. Provided printed information maintain metformin dose Add glipizide 5mg  XR F/up in 56months  Wt Readings from Last 3 Encounters:  09/14/21 199 lb 3.2 oz (90.4 kg)  09/04/21 200 lb 4 oz (90.8 kg)  05/14/21 211 lb 2 oz (95.8 kg)    Reviewed past Medical, Social and Family history today.  Outpatient Medications Prior to Visit  Medication Sig Dispense Refill   amLODipine (NORVASC) 10 MG tablet TAKE 1 TABLET BY MOUTH AT BEDTIME 90 tablet 3   atorvastatin (LIPITOR) 20 MG tablet Take 1 tablet (20 mg total) by mouth at bedtime. 90 tablet 3   calcium-vitamin D (OSCAL WITH D) 500-200 MG-UNIT tablet Take 1 tablet by mouth.     CREON 36000-114000 units CPEP capsule TAKE 2 CAPSULES BY MOUTH THREE TIMES DAILY BEFORE MEAL(S) 180 capsule 0   fluticasone (FLONASE) 50 MCG/ACT nasal spray Place 2 sprays into both nostrils daily. 16 g 1   glucose blood test strip Test blood glucose once daily. One Touch Verio test strips 100 each 6   metoprolol succinate (TOPROL-XL) 25 MG 24 hr tablet TAKE 1 TABLET BY MOUTH ONCE DAILY 90 tablet 3   olmesartan (BENICAR) 5 MG tablet Take 1 tablet (5 mg total) by mouth daily. 90 tablet 3   pantoprazole (PROTONIX) 40 MG tablet Take 1 tablet (40 mg total) by mouth 2 (two) times daily. 108 tablet 0    potassium chloride (KLOR-CON M) 10 MEQ tablet Take 1 tablet (10 mEq total) by mouth daily. 30 tablet 0   meclizine (ANTIVERT) 25 MG tablet Take 1 tablet (25 mg total) by mouth 3 (three) times daily as needed for dizziness. (Patient not taking: Reported on 09/04/2021) 20 tablet 1   metFORMIN (GLUCOPHAGE XR) 750 MG 24 hr tablet Take 1 tablet (750 mg total) by mouth daily with breakfast. 180 tablet 1   predniSONE (DELTASONE) 10 MG tablet Take 3 pills once daily by mouth for 3 days, then 2 pills once daily for 3 days, then 1 pill once daily for 3 days and then stop 18 tablet 0   No facility-administered medications prior to visit.   ROS See HPI  Objective:  BP 132/78 (BP Location: Left Arm, Patient Position: Sitting, Cuff Size: Large)    Pulse 90    Temp (!) 97 F (36.1 C) (Temporal)    Ht 5\' 3"  (1.6 m)    Wt 199 lb 3.2 oz (90.4 kg)    SpO2 97%    BMI 35.29 kg/m   Physical Exam Cardiovascular:     Rate and Rhythm: Normal rate.     Pulses: Normal pulses.  Pulmonary:     Effort: Pulmonary effort is normal.  Neurological:     Mental Status: She is alert and oriented to person, place, and time.    Assessment &  Plan:  This visit occurred during the SARS-CoV-2 public health emergency.  Safety protocols were in place, including screening questions prior to the visit, additional usage of staff PPE, and extensive cleaning of exam room while observing appropriate contact time as indicated for disinfecting solutions.   Melanie Alvarez was seen today for follow-up.  Diagnoses and all orders for this visit:  Type 2 diabetes mellitus with other specified complication, without long-term current use of insulin (H[facility]) -     POCT glycosylated hemoglobin (Hb A1C) -     metFORMIN (GLUCOPHAGE XR) 750 MG 24 hr tablet; Take 1 tablet (750 mg total) by mouth daily with breakfast. -     glipiZIDE (GLUCOTROL XL) 5 MG 24 hr tablet; Take 1 tablet (5 mg total) by mouth daily with breakfast.  Type 2 diabetes mellitus with  other specified complication, without long-term current use of insulin ([facility]) -     POCT glycosylated hemoglobin (Hb A1C) -     metFORMIN (GLUCOPHAGE XR) 750 MG 24 hr tablet; Take 1 tablet (750 mg total) by mouth daily with breakfast. -     glipiZIDE (GLUCOTROL XL) 5 MG 24 hr tablet; Take 1 tablet (5 mg total) by mouth daily with breakfast.  Advied to resume potassium supplement, take with food  Problem List Items Addressed This Visit       Endocrine   DM (diabetes mellitus) ([place]) - Primary    Uncontrolled, non fatsing glucose 225 Current use of metformin 750mg  XR daily She admits to high carb and glucose diet POC hgbA1c at 9.9% today  Advised about the importance of diet compliance. Provided printed information maintain metformin dose Add glipizide 5mg  XR F/up in 3months       Relevant Medications   metFORMIN (GLUCOPHAGE XR) 750 MG 24 hr tablet   glipiZIDE (GLUCOTROL XL) 5 MG 24 hr tablet   Other Relevant Orders   POCT glycosylated hemoglobin (Hb A1C) (Completed)    Follow-up: Return in about 3 months (around 12/13/2021) for CPE (fasting).  Wilfred Lacy, NP

## 2021-09-14 NOTE — Patient Instructions (Addendum)
Continue potassium supplement. Take with food Start glipizide Continue metformin Schedule DM eye exam, have report sent to me  Diabetes Mellitus and Nutrition, Adult When you have diabetes, or diabetes mellitus, it is very important to have healthy eating habits because your blood sugar (glucose) levels are greatly affected by what you eat and drink. Eating healthy foods in the right amounts, at about the same times every day, can help you: Manage your blood glucose. Lower your risk of heart disease. Improve your blood pressure. Reach or maintain a healthy weight. What can affect my meal plan? Every person with diabetes is different, and each person has different needs for a meal plan. Your health care provider may recommend that you work with a dietitian to make a meal plan that is best for you. Your meal plan may vary depending on factors such as: The calories you need. The medicines you take. Your weight. Your blood glucose, blood pressure, and cholesterol levels. Your activity level. Other health conditions you have, such as heart or kidney disease. How do carbohydrates affect me? Carbohydrates, also called carbs, affect your blood glucose level more than any other type of food. Eating carbs raises the amount of glucose in your blood. It is important to know how many carbs you can safely have in each meal. This is different for every person. Your dietitian can help you calculate how many carbs you should have at each meal and for each snack. How does alcohol affect me? Alcohol can cause a decrease in blood glucose (hypoglycemia), especially if you use insulin or take certain diabetes medicines by mouth. Hypoglycemia can be a life-threatening condition. Symptoms of hypoglycemia, such as sleepiness, dizziness, and confusion, are similar to symptoms of having too much alcohol. Do not drink alcohol if: Your health care provider tells you not to drink. You are pregnant, may be pregnant, or  are planning to become pregnant. If you drink alcohol: Limit how much you have to: 0-1 drink a day for women. 0-2 drinks a day for men. Know how much alcohol is in your drink. In the [place] one drink equals one 12 oz bottle of beer (355 mL), one 5 oz glass of wine (148 mL), or one 1 oz glass of hard liquor (44 mL). Keep yourself hydrated with water, diet soda, or unsweetened iced tea. Keep in mind that regular soda, juice, and other mixers may contain a lot of sugar and must be counted as carbs. What are tips for following this plan? Reading food labels Start by checking the serving size on the Nutrition Facts label of packaged foods and drinks. The number of calories and the amount of carbs, fats, and other nutrients listed on the label are based on one serving of the item. Many items contain more than one serving per package. Check the total grams (g) of carbs in one serving. Check the number of grams of saturated fats and trans fats in one serving. Choose foods that have a low amount or none of these fats. Check the number of milligrams (mg) of salt (sodium) in one serving. Most people should limit total sodium intake to less than 2,300 mg per day. Always check the nutrition information of foods labeled as "low-fat" or "nonfat." These foods may be higher in added sugar or refined carbs and should be avoided. Talk to your dietitian to identify your daily goals for nutrients listed on the label. Shopping Avoid buying canned, pre-made, or processed foods. These foods tend to be high  in fat, sodium, and added sugar. Shop around the outside edge of the grocery store. This is where you will most often find fresh fruits and vegetables, bulk grains, fresh meats, and fresh dairy products. Cooking Use low-heat cooking methods, such as baking, instead of high-heat cooking methods, such as deep frying. Cook using healthy oils, such as olive, canola, or sunflower oil. Avoid cooking with butter, cream, or  high-fat meats. Meal planning Eat meals and snacks regularly, preferably at the same times every day. Avoid going long periods of time without eating. Eat foods that are high in fiber, such as fresh fruits, vegetables, beans, and whole grains. Eat 4-6 oz (112-168 g) of lean protein each day, such as lean meat, chicken, fish, eggs, or tofu. One ounce (oz) (28 g) of lean protein is equal to: 1 oz (28 g) of meat, chicken, or fish. 1 egg.  cup (62 g) of tofu. Eat some foods each day that contain healthy fats, such as avocado, nuts, seeds, and fish. What foods should I eat? Fruits Berries. Apples. Oranges. Peaches. Apricots. Plums. Grapes. Mangoes. Papayas. Pomegranates. Kiwi. Cherries. Vegetables Leafy greens, including lettuce, spinach, kale, chard, collard greens, mustard greens, and cabbage. Beets. Cauliflower. Broccoli. Carrots. Green beans. Tomatoes. Peppers. Onions. Cucumbers. Brussels sprouts. Grains Whole grains, such as whole-wheat or whole-grain bread, crackers, tortillas, cereal, and pasta. Unsweetened oatmeal. Quinoa. Brown or wild rice. Meats and other proteins Seafood. Poultry without skin. Lean cuts of poultry and beef. Tofu. Nuts. Seeds. Dairy Low-fat or fat-free dairy products such as milk, yogurt, and cheese. The items listed above may not be a complete list of foods and beverages you can eat and drink. Contact a dietitian for more information. What foods should I avoid? Fruits Fruits canned with syrup. Vegetables Canned vegetables. Frozen vegetables with butter or cream sauce. Grains Refined white flour and flour products such as bread, pasta, snack foods, and cereals. Avoid all processed foods. Meats and other proteins Fatty cuts of meat. Poultry with skin. Breaded or fried meats. Processed meat. Avoid saturated fats. Dairy Full-fat yogurt, cheese, or milk. Beverages Sweetened drinks, such as soda or iced tea. The items listed above may not be a complete list of  foods and beverages you should avoid. Contact a dietitian for more information. Questions to ask a health care provider Do I need to meet with a certified diabetes care and education specialist? Do I need to meet with a dietitian? What number can I call if I have questions? When are the best times to check my blood glucose? Where to find more information: American Diabetes Association: diabetes.org Academy of Nutrition and Dietetics: eatright.[employer] of Diabetes and Digestive and Kidney Diseases: [employer] Association of Diabetes Care & Education Specialists: diabeteseducator.org Summary It is important to have healthy eating habits because your blood sugar (glucose) levels are greatly affected by what you eat and drink. It is important to use alcohol carefully. A healthy meal plan will help you manage your blood glucose and lower your risk of heart disease. Your health care provider may recommend that you work with a dietitian to make a meal plan that is best for you. This information is not intended to replace advice given to you by your health care provider. Make sure you discuss any questions you have with your health care provider. Document Revised: 03/08/2020 Document Reviewed: 03/08/2020 Elsevier Patient Education  [place].

## 2021-10-06 ENCOUNTER — Other Ambulatory Visit: Payer: Self-pay | Admitting: Family Medicine

## 2021-10-08 ENCOUNTER — Other Ambulatory Visit: Payer: Self-pay | Admitting: Nurse Practitioner

## 2021-10-08 DIAGNOSIS — E876 Hypokalemia: Secondary | ICD-10-CM

## 2021-10-08 NOTE — Progress Notes (Signed)
Schedule lab appt

## 2021-10-09 NOTE — Telephone Encounter (Signed)
Lab appointment scheduled 

## 2021-10-11 ENCOUNTER — Other Ambulatory Visit: Payer: BC Managed Care – PPO

## 2021-11-17 ENCOUNTER — Other Ambulatory Visit: Payer: Self-pay | Admitting: Hematology & Oncology

## 2021-11-17 LAB — HM DIABETES EYE EXAM

## 2021-11-19 ENCOUNTER — Encounter: Payer: Self-pay | Admitting: Hematology & Oncology

## 2021-11-23 ENCOUNTER — Other Ambulatory Visit: Payer: Self-pay | Admitting: Nurse Practitioner

## 2021-11-23 DIAGNOSIS — I10 Essential (primary) hypertension: Secondary | ICD-10-CM

## 2021-11-27 ENCOUNTER — Encounter: Payer: Self-pay | Admitting: Nurse Practitioner

## 2021-11-27 ENCOUNTER — Encounter: Payer: BC Managed Care – PPO | Admitting: Nurse Practitioner

## 2021-11-27 ENCOUNTER — Other Ambulatory Visit: Payer: Self-pay

## 2021-11-27 ENCOUNTER — Telehealth: Payer: Self-pay

## 2021-11-27 ENCOUNTER — Other Ambulatory Visit (INDEPENDENT_AMBULATORY_CARE_PROVIDER_SITE_OTHER): Payer: BC Managed Care – PPO

## 2021-11-27 ENCOUNTER — Ambulatory Visit (INDEPENDENT_AMBULATORY_CARE_PROVIDER_SITE_OTHER): Payer: BC Managed Care – PPO | Admitting: Nurse Practitioner

## 2021-11-27 ENCOUNTER — Telehealth: Payer: Self-pay | Admitting: Nurse Practitioner

## 2021-11-27 VITALS — BP 130/84 | HR 92 | Temp 97.1°F | Ht 63.0 in | Wt 189.6 lb

## 2021-11-27 DIAGNOSIS — R7309 Other abnormal glucose: Secondary | ICD-10-CM

## 2021-11-27 DIAGNOSIS — Z8507 Personal history of malignant neoplasm of pancreas: Secondary | ICD-10-CM

## 2021-11-27 DIAGNOSIS — Z1211 Encounter for screening for malignant neoplasm of colon: Secondary | ICD-10-CM | POA: Diagnosis not present

## 2021-11-27 DIAGNOSIS — R1032 Left lower quadrant pain: Secondary | ICD-10-CM

## 2021-11-27 DIAGNOSIS — Z1231 Encounter for screening mammogram for malignant neoplasm of breast: Secondary | ICD-10-CM

## 2021-11-27 DIAGNOSIS — E876 Hypokalemia: Secondary | ICD-10-CM

## 2021-11-27 DIAGNOSIS — I700 Atherosclerosis of aorta: Secondary | ICD-10-CM

## 2021-11-27 DIAGNOSIS — I10 Essential (primary) hypertension: Secondary | ICD-10-CM

## 2021-11-27 LAB — CBC WITH DIFFERENTIAL/PLATELET
Basophils Absolute: 0.1 10*3/uL (ref 0.0–0.1)
Basophils Relative: 0.8 % (ref 0.0–3.0)
Eosinophils Absolute: 0.1 10*3/uL (ref 0.0–0.7)
Eosinophils Relative: 0.8 % (ref 0.0–5.0)
HCT: 37.1 % (ref 36.0–46.0)
Hemoglobin: 12.3 g/dL (ref 12.0–15.0)
Lymphocytes Relative: 9.6 % — ABNORMAL LOW (ref 12.0–46.0)
Lymphs Abs: 0.7 10*3/uL (ref 0.7–4.0)
MCHC: 33.1 g/dL (ref 30.0–36.0)
MCV: 83.2 fl (ref 78.0–100.0)
Monocytes Absolute: 0.3 10*3/uL (ref 0.1–1.0)
Monocytes Relative: 3.9 % (ref 3.0–12.0)
Neutro Abs: 6.5 10*3/uL (ref 1.4–7.7)
Neutrophils Relative %: 84.9 % — ABNORMAL HIGH (ref 43.0–77.0)
Platelets: 310 10*3/uL (ref 150.0–400.0)
RBC: 4.47 Mil/uL (ref 3.87–5.11)
RDW: 14.1 % (ref 11.5–15.5)
WBC: 7.6 10*3/uL (ref 4.0–10.5)

## 2021-11-27 LAB — COMPREHENSIVE METABOLIC PANEL
ALT: 11 U/L (ref 0–35)
AST: 15 U/L (ref 0–37)
Albumin: 3.9 g/dL (ref 3.5–5.2)
Alkaline Phosphatase: 56 U/L (ref 39–117)
BUN: 10 mg/dL (ref 6–23)
CO2: 28 mEq/L (ref 19–32)
Calcium: 9.5 mg/dL (ref 8.4–10.5)
Chloride: 94 mEq/L — ABNORMAL LOW (ref 96–112)
Creatinine, Ser: 0.66 mg/dL (ref 0.40–1.20)
GFR: 92.52 mL/min (ref >60.00)
Glucose, Bld: 270 mg/dL — ABNORMAL HIGH (ref 70–99)
Potassium: 2.8 mEq/L — CL (ref 3.5–5.1)
Sodium: 132 mEq/L — ABNORMAL LOW (ref 135–145)
Total Bilirubin: 0.4 mg/dL (ref 0.2–1.2)
Total Protein: 7.6 g/dL (ref 6.0–8.3)

## 2021-11-27 LAB — HEMOGLOBIN A1C: Hgb A1c MFr Bld: 7.5 % — ABNORMAL HIGH (ref 4.6–6.5)

## 2021-11-27 LAB — LIPASE: Lipase: 20 U/L (ref 11.0–59.0)

## 2021-11-27 LAB — AMYLASE: Amylase: 41 U/L (ref 27–131)

## 2021-11-27 MED ORDER — POTASSIUM CHLORIDE CRYS ER 20 MEQ PO TBCR: 20.0000 meq | EXTENDED_RELEASE_TABLET | Freq: Two times a day (BID) | ORAL | 0 refills | Status: DC

## 2021-11-27 MED ORDER — AMLODIPINE BESYLATE 10 MG PO TABS: 10.0000 mg | ORAL_TABLET | Freq: Every day | ORAL | 3 refills | Status: DC

## 2021-11-27 NOTE — Addendum Note (Signed)
Addended by: Beryle Lathe S on: 11/27/2021 03:27 PM ? ? Modules accepted: Orders ? ?

## 2021-11-27 NOTE — Progress Notes (Signed)
? ?             Established Patient Visit ? ?Patient: Melanie Alvarez   DOB: 01-22-1956   65 y.o. Female  MRN: [id number] ?Visit Date: 11/27/2021 ? ?Subjective:  ?  ?Chief Complaint  ?Patient presents with  ? Acute Visit  ?  ABD pain x 28month  ? ?Abdominal Pain ?This is a new problem. The current episode started more than 1 month ago. The onset quality is gradual. The problem occurs intermittently. The problem has been gradually worsening. The pain is located in the periumbilical region and LLQ. The pain is moderate. The quality of the pain is a sensation of fullness and cramping. The abdominal pain does not radiate. Associated symptoms include anorexia and weight loss. Pertinent negatives include no constipation, diarrhea, frequency, hematuria, melena, nausea or vomiting. Nothing aggravates the pain. The pain is relieved by Nothing. She has tried nothing for the symptoms. Her past medical history is significant for pancreatitis. There is no history of abdominal surgery, colon cancer, Crohn's disease, gallstones, GERD, irritable bowel syndrome, PUD or ulcerative colitis.  ?BM daily or every other day. ?glucose check at home:120-140. ? ?Wt Readings from Last 3 Encounters:  ?11/27/21 189 lb 9.6 oz (86 kg)  ?09/14/21 199 lb 3.2 oz (90.4 kg)  ?09/04/21 200 lb 4 oz (90.8 kg)  ?  ?Aortic atherosclerosis ([place] ?BP at goal ?BP Readings from Last 3 Encounters:  ?11/27/21 130/84  ?09/14/21 132/78  ?09/04/21 130/78  ?current use of atorvastatin '20mg'$  ?Unable to repeat lipid panel today (not fasting) ? ?History of pancreatic cancer ?Followed by Dr. EMarin Olp Last OV 10/2020 ?Completed folfirinox 09/2019-11/2019 ?s/p ablative radiation therapy completed 02/2020 ?Repeat CT ABd/pelvis 12/2020:Stable ill-defined soft tissue prominence with bulky calcifications in pancreatic head. Slight increase in size of 14 mm subcapsular mass in lower pole of right kidney, consistent with small renal cell carcinoma. No evidence of metastatic  disease within the chest, abdomen, or pelvis. ? ?Advised to schedule f/up with Dr. EMarin Olp ? ?Reviewed medical, surgical, and social history today ? ?Medications: ?Outpatient Medications Prior to Visit  ?Medication Sig  ? amLODipine (NORVASC) 10 MG tablet TAKE 1 TABLET BY MOUTH AT BEDTIME  ? atorvastatin (LIPITOR) 20 MG tablet Take 1 tablet (20 mg total) by mouth at bedtime.  ? calcium-vitamin D (OSCAL WITH D) 500-200 MG-UNIT tablet Take 1 tablet by mouth.  ? CREON 36000-114000 units CPEP capsule TAKE 2 CAPSULES BY MOUTH THREE TIMES DAILY BEFORE MEAL(S)  ? fluticasone (FLONASE) 50 MCG/ACT nasal spray Place 2 sprays into both nostrils daily.  ? glipiZIDE (GLUCOTROL XL) 5 MG 24 hr tablet Take 1 tablet (5 mg total) by mouth daily with breakfast.  ? glucose blood test strip Test blood glucose once daily. One Touch Verio test strips  ? metFORMIN (GLUCOPHAGE XR) 750 MG 24 hr tablet Take 1 tablet (750 mg total) by mouth daily with breakfast.  ? metoprolol succinate (TOPROL-XL) 25 MG 24 hr tablet TAKE 1 TABLET BY MOUTH ONCE DAILY  ? olmesartan (BENICAR) 5 MG tablet Take 1 tablet (5 mg total) by mouth daily.  ? [DISCONTINUED] potassium chloride (KLOR-CON M) 10 MEQ tablet Take 1 tablet (10 mEq total) by mouth daily.  ? pantoprazole (PROTONIX) 40 MG tablet Take 1 tablet (40 mg total) by mouth 2 (two) times daily.  ? ?No facility-administered medications prior to visit.  ? ?Reviewed past medical and social history.  ? ?ROS per HPI above ? ? ?   ?Objective:  ?BP  130/84 (BP Location: Left Arm, Patient Position: Sitting, Cuff Size: Normal)   Pulse 92   Temp (!) 97.1 ?F (36.2 ?C) (Temporal)   Ht '5\' 3"'$  (1.6 m)   Wt 189 lb 9.6 oz (86 kg)   SpO2 99%   BMI 33.59 kg/m?  ? ?  ? ?Physical Exam ?Vitals reviewed.  ?Cardiovascular:  ?   Rate and Rhythm: Normal rate.  ?   Pulses: Normal pulses.  ?Pulmonary:  ?   Effort: Pulmonary effort is normal.  ?Abdominal:  ?   General: There is no distension.  ?   Palpations: Abdomen is soft.  ?    Tenderness: There is abdominal tenderness. There is guarding. There is no right CVA tenderness or left CVA tenderness.  ?Neurological:  ?   Mental Status: She is alert and oriented to person, place, and time.  ?  ?Results for orders placed or performed in visit on 11/27/21  ?Comprehensive metabolic panel  ?Result Value Ref Range  ? Sodium 132 (L) 135 - 145 mEq/L  ? Potassium 2.8 (LL) 3.5 - 5.1 mEq/L  ? Chloride 94 (L) 96 - 112 mEq/L  ? CO2 28 19 - 32 mEq/L  ? Glucose, Bld 270 (H) 70 - 99 mg/dL  ? BUN 10 6 - 23 mg/dL  ? Creatinine, Ser 0.66 0.40 - 1.20 mg/dL  ? Total Bilirubin 0.4 0.2 - 1.2 mg/dL  ? Alkaline Phosphatase 56 39 - 117 U/L  ? AST 15 0 - 37 U/L  ? ALT 11 0 - 35 U/L  ? Total Protein 7.6 6.0 - 8.3 g/dL  ? Albumin 3.9 3.5 - 5.2 g/dL  ? GFR 92.52 >60.00 mL/min  ? Calcium 9.5 8.4 - 10.5 mg/dL  ?Lipase  ?Result Value Ref Range  ? Lipase 20.0 11.0 - 59.0 U/L  ?Amylase  ?Result Value Ref Range  ? Amylase 41 27 - 131 U/L  ?CBC with Differential/Platelet  ?Result Value Ref Range  ? WBC 7.6 4.0 - 10.5 K/uL  ? RBC 4.47 3.87 - 5.11 Mil/uL  ? Hemoglobin 12.3 12.0 - 15.0 g/dL  ? HCT 37.1 36.0 - 46.0 %  ? MCV 83.2 78.0 - 100.0 fl  ? MCHC 33.1 30.0 - 36.0 g/dL  ? RDW 14.1 11.5 - 15.5 %  ? Platelets 310.0 150.0 - 400.0 K/uL  ? Neutrophils Relative % 84.9 (H) 43.0 - 77.0 %  ? Lymphocytes Relative 9.6 (L) 12.0 - 46.0 %  ? Monocytes Relative 3.9 3.0 - 12.0 %  ? Eosinophils Relative 0.8 0.0 - 5.0 %  ? Basophils Relative 0.8 0.0 - 3.0 %  ? Neutro Abs 6.5 1.4 - 7.7 K/uL  ? Lymphs Abs 0.7 0.7 - 4.0 K/uL  ? Monocytes Absolute 0.3 0.1 - 1.0 K/uL  ? Eosinophils Absolute 0.1 0.0 - 0.7 K/uL  ? Basophils Absolute 0.1 0.0 - 0.1 K/uL  ? ?   ?Assessment & Plan:  ?  ?Problem List Items Addressed This Visit   ? ?  ? Cardiovascular and Mediastinum  ? Aortic atherosclerosis ([place])  ?  BP at goal ?BP Readings from Last 3 Encounters:  ?11/27/21 130/84  ?09/14/21 132/78  ?09/04/21 130/78  ?current use of atorvastatin '20mg'$  ?Unable to repeat lipid  panel today (not fasting) ?  ?  ?  ? Other  ? History of pancreatic cancer  ?  Followed by Dr. Marin Olp. Last OV 10/2020 ?Completed folfirinox 09/2019-11/2019 ?s/p ablative radiation therapy completed 02/2020 ?Repeat CT ABd/pelvis 12/2020:Stable ill-defined soft tissue prominence  with bulky calcifications in pancreatic head. Slight increase in size of 14 mm subcapsular mass in lower pole of right kidney, consistent with small renal cell carcinoma. No evidence of metastatic disease within the chest, abdomen, or pelvis. ? ?Advised to schedule f/up with Dr. Marin Olp ?  ?  ? Relevant Orders  ? Lipase (Completed)  ? Amylase (Completed)  ? CBC with Differential/Platelet (Completed)  ? CT ABDOMEN PELVIS W CONTRAST  ? Hypokalemia  ? Relevant Medications  ? potassium chloride (KLOR-CON M) 20 MEQ tablet  ? Other Relevant Orders  ? Basic metabolic panel  ? ?Other Visit Diagnoses   ? ? Left lower quadrant abdominal pain    -  Primary  ? Relevant Orders  ? Comprehensive metabolic panel (Completed)  ? Urinalysis w microscopic + reflex cultur  ? Lipase (Completed)  ? Amylase (Completed)  ? CBC with Differential/Platelet (Completed)  ? CT ABDOMEN PELVIS W CONTRAST  ? Breast cancer screening by mammogram      ? Relevant Orders  ? MM 3D SCREEN BREAST BILATERAL  ? Colon cancer screening      ? Relevant Orders  ? Ambulatory referral to Gastroenterology  ? ?  ? ?Return in about 3 months (around 02/26/2022) for CPE (fasting). ? ?  ? ?Wilfred Lacy, NP ? ? ? ?

## 2021-11-27 NOTE — Telephone Encounter (Signed)
Pt called to ask if she can take tylenol? ?

## 2021-11-27 NOTE — Telephone Encounter (Signed)
Refill not appropriate

## 2021-11-27 NOTE — Telephone Encounter (Signed)
Critical value: provider notified  ?

## 2021-11-27 NOTE — Assessment & Plan Note (Signed)
BP at goal ?BP Readings from Last 3 Encounters:  ?11/27/21 130/84  ?09/14/21 132/78  ?09/04/21 130/78  ?current use of atorvastatin '20mg'$  ?Unable to repeat lipid panel today (not fasting) ?

## 2021-11-27 NOTE — Assessment & Plan Note (Addendum)
Followed by Dr. Marin Olp. Last OV 10/2020 ?Completed folfirinox 09/2019-11/2019 ?s/p ablative radiation therapy completed 02/2020 ?Repeat CT ABd/pelvis 12/2020:Stable ill-defined soft tissue prominence with bulky calcifications in pancreatic head. Slight increase in size of 14 mm subcapsular mass in lower pole of right kidney, consistent with small renal cell carcinoma. No evidence of metastatic disease within the chest, abdomen, or pelvis. ? ?Advised to schedule f/up with Dr. Marin Olp ?

## 2021-11-27 NOTE — Patient Instructions (Addendum)
Go to lab for blood draw and urine collection ?Schedule appt for annual mammogram ?You will be contacted to schedule appt with GI and for CT ABD/pelvis ?Maintain BRAT diet. ? ?Bland Diet ?A bland diet consists of foods that are often soft and do not have a lot of fat, fiber, or extra seasonings. Foods without fat, fiber, or seasoning are easier for the body to digest. They are also less likely to irritate your mouth, throat, stomach, and other parts of your digestive system. A bland diet is sometimes called a BRAT diet. ?What is my plan? ?Your health care provider or food and nutrition specialist (dietitian) may recommend specific changes to your diet to prevent symptoms or to treat your symptoms. These changes may include: ?Eating small meals often. ?Cooking food until it is soft enough to chew easily. ?Chewing your food well. ?Drinking fluids slowly. ?Not eating foods that are very spicy, sour, or fatty. ?Not eating citrus fruits, such as oranges and grapefruit. ?What do I need to know about this diet? ?Eat a variety of foods from the bland diet food list. ?Do not follow a bland diet longer than needed. ?Ask your health care provider whether you should take vitamins or supplements. ?What foods can I eat? ?Grains ?Hot cereals, such as cream of wheat. Rice. Bread, crackers, or tortillas made from refined white flour. ?Vegetables ?Canned or cooked vegetables. Mashed or boiled potatoes. ?Fruits ?Bananas. Applesauce. Other types of cooked or canned fruit with the skin and seeds removed, such as canned peaches or pears. ?Meats and other proteins ?Scrambled eggs. Creamy peanut butter or other nut butters. Lean, well-cooked meats, such as chicken or fish. Tofu. Soups or broths. ?Dairy ?Low-fat dairy products, such as milk, cottage cheese, or yogurt. ?Beverages ?Water. Herbal tea. Apple juice. ?Fats and oils ?Mild salad dressings. Canola or olive oil. ?Sweets and desserts ?Pudding. Custard. Fruit gelatin. Ice cream. ?The  items listed above may not be a complete list of recommended foods and beverages. Contact a dietitian for more options. ?What foods are not recommended? ?Grains ?Whole grain breads and cereals. ?Vegetables ?Raw vegetables. ?Fruits ?Raw fruits, especially citrus, berries, or dried fruits. ?Dairy ?Whole fat dairy foods. ?Beverages ?Caffeinated drinks. Alcohol. ?Seasonings and condiments ?Strongly flavored seasonings or condiments. Hot sauce. Salsa. ?Other foods ?Spicy foods. Fried foods. Sour foods, such as pickled or fermented foods. Foods with high sugar content. Foods high in fiber. ?The items listed above may not be a complete list of foods and beverages to avoid. Contact a dietitian for more information. ?Summary ?A bland diet consists of foods that are often soft and do not have a lot of fat, fiber, or extra seasonings. ?Foods without fat, fiber, or seasoning are easier for the body to digest. ?Check with your health care provider to see how long you should follow this diet plan. It is not meant to be followed for long periods. ?This information is not intended to replace advice given to you by your health care provider. Make sure you discuss any questions you have with your health care provider. ?Document Revised: 09/03/2017 Document Reviewed: 09/03/2017 ?Elsevier Patient Education ? [place]. ? ?

## 2021-11-28 NOTE — Telephone Encounter (Signed)
Discussed during recent appointment with provider.  ?

## 2021-11-29 LAB — URINE CULTURE
MICRO NUMBER:: 13253519
SPECIMEN QUALITY:: ADEQUATE

## 2021-11-29 LAB — URINALYSIS W MICROSCOPIC + REFLEX CULTURE
Bilirubin Urine: NEGATIVE
Hgb urine dipstick: NEGATIVE
Hyaline Cast: NONE SEEN /LPF
Ketones, ur: NEGATIVE
Nitrites, Initial: NEGATIVE
RBC / HPF: NONE SEEN /HPF (ref 0–2)
Specific Gravity, Urine: 1.019 (ref 1.001–1.035)
pH: 6 (ref 5.0–8.0)

## 2021-11-29 LAB — CULTURE INDICATED

## 2021-12-04 ENCOUNTER — Other Ambulatory Visit: Payer: BC Managed Care – PPO

## 2021-12-04 ENCOUNTER — Encounter: Payer: Self-pay | Admitting: Nurse Practitioner

## 2021-12-04 ENCOUNTER — Ambulatory Visit (INDEPENDENT_AMBULATORY_CARE_PROVIDER_SITE_OTHER): Payer: BC Managed Care – PPO | Admitting: Nurse Practitioner

## 2021-12-04 ENCOUNTER — Telehealth: Payer: Self-pay | Admitting: Nurse Practitioner

## 2021-12-04 VITALS — BP 136/86 | HR 94 | Temp 97.4°F | Ht 63.0 in | Wt 187.6 lb

## 2021-12-04 DIAGNOSIS — E876 Hypokalemia: Secondary | ICD-10-CM | POA: Diagnosis not present

## 2021-12-04 DIAGNOSIS — R1032 Left lower quadrant pain: Secondary | ICD-10-CM | POA: Diagnosis not present

## 2021-12-04 DIAGNOSIS — Z8507 Personal history of malignant neoplasm of pancreas: Secondary | ICD-10-CM

## 2021-12-04 DIAGNOSIS — R1013 Epigastric pain: Secondary | ICD-10-CM

## 2021-12-04 LAB — RENAL FUNCTION PANEL
Albumin: 3.8 g/dL (ref 3.5–5.2)
BUN: 7 mg/dL (ref 6–23)
CO2: 28 mEq/L (ref 19–32)
Calcium: 9.5 mg/dL (ref 8.4–10.5)
Chloride: 96 mEq/L (ref 96–112)
Creatinine, Ser: 0.58 mg/dL (ref 0.40–1.20)
GFR: 95.43 mL/min (ref >60.00)
Glucose, Bld: 150 mg/dL — ABNORMAL HIGH (ref 70–99)
Phosphorus: 3.4 mg/dL (ref 2.3–4.6)
Potassium: 4 mEq/L (ref 3.5–5.1)
Sodium: 132 mEq/L — ABNORMAL LOW (ref 135–145)

## 2021-12-04 MED ORDER — PANTOPRAZOLE SODIUM 40 MG PO TBEC: 40.0000 mg | DELAYED_RELEASE_TABLET | Freq: Two times a day (BID) | ORAL | 0 refills | Status: DC

## 2021-12-04 NOTE — Assessment & Plan Note (Signed)
-   Repeat BMP

## 2021-12-04 NOTE — Telephone Encounter (Signed)
Pt called to say [facility] told her Baldo Ash will need to send a request to the pts insurance for the referral.  ?

## 2021-12-04 NOTE — Patient Instructions (Signed)
Let me know if you change you mind about use of pantoprazole ?Schedule appt for CT ABD/pelvis: [phone]. ?Schedule appt with Dr. Marin Olp. ?

## 2021-12-04 NOTE — Assessment & Plan Note (Signed)
Reports last ETOh consumption in 2022 ?

## 2021-12-04 NOTE — Progress Notes (Signed)
? ?             Established Patient Visit ? ?Patient: Melanie Alvarez   DOB: [birth year]   66 y.o. Female  MRN: [id number] ?Visit Date: 12/04/2021 ? ?Subjective:  ?  ?Chief Complaint  ?Patient presents with  ? Acute Visit  ?  Pt c/o stomach pain x 3-4 days. Pt states she had a hernia in the area where the pain is before and she is concerned this may be causing the pain again. Pain starts just below ribs and travels into lower stomach.   ? ?HPI ?Alcohol cessation counseling ?Reports last ETOh consumption in 2022 ? ?Left lower quadrant abdominal pain ?Persistent pain and nausea since 06/2021. Worse postprandial. Reports she is compliant with use of creon with meals. ?Improves with tylenol '650mg'$  2-3tabs per day ?Continuous weight loss noted ?Wt Readings from Last 3 Encounters:  ?12/04/21 187 lb 9.6 oz (85.1 kg)  ?11/27/21 189 lb 9.6 oz (86 kg)  ?09/14/21 199 lb 3.2 oz (90.4 kg)  ? ?No ETOH since 2022 per aptient ?Daily tobacco use 3cig per day ?D/c use of PPI in 2021. ?No melena, no hematochezia, no constipation or diarrhea. ?She did not schedule CT ABD as directed 1week ago. ? ?Recurrent pancreatic cancer vs GERD? ?Normal lipase, amylase, urinalysis, CMP and CBC ?Resume pantoprazole '40mg'$  BID ?Advised to schedule CT ABD/pelvis. Provided number. ? ?Hypokalemia ?Repeat BMP ?  ?Reviewed medical, surgical, and social history today ? ?Medications: ?Outpatient Medications Prior to Visit  ?Medication Sig  ? amLODipine (NORVASC) 10 MG tablet Take 1 tablet (10 mg total) by mouth at bedtime.  ? atorvastatin (LIPITOR) 20 MG tablet Take 1 tablet (20 mg total) by mouth at bedtime.  ? calcium-vitamin D (OSCAL WITH D) 500-200 MG-UNIT tablet Take 1 tablet by mouth.  ? CREON 36000-114000 units CPEP capsule TAKE 2 CAPSULES BY MOUTH THREE TIMES DAILY BEFORE MEAL(S)  ? fluticasone (FLONASE) 50 MCG/ACT nasal spray Place 2 sprays into both nostrils daily.  ? glipiZIDE (GLUCOTROL XL) 5 MG 24 hr tablet Take 1 tablet (5 mg total) by mouth daily  with breakfast.  ? glucose blood test strip Test blood glucose once daily. One Touch Verio test strips  ? metFORMIN (GLUCOPHAGE XR) 750 MG 24 hr tablet Take 1 tablet (750 mg total) by mouth daily with breakfast.  ? metoprolol succinate (TOPROL-XL) 25 MG 24 hr tablet TAKE 1 TABLET BY MOUTH ONCE DAILY  ? olmesartan (BENICAR) 5 MG tablet Take 1 tablet (5 mg total) by mouth daily.  ? potassium chloride (KLOR-CON M) 20 MEQ tablet Take 1 tablet (20 mEq total) by mouth 2 (two) times daily.  ? [DISCONTINUED] pantoprazole (PROTONIX) 40 MG tablet Take 1 tablet (40 mg total) by mouth 2 (two) times daily.  ? ?No facility-administered medications prior to visit.  ? ?Reviewed past medical and social history.  ? ?ROS per HPI above ? ? ?   ?Objective:  ?BP 136/86 (BP Location: Left Arm, Patient Position: Sitting, Cuff Size: Normal)   Pulse 94   Temp (!) 97.4 ?F (36.3 ?C) (Temporal)   Ht '5\' 3"'$  (1.6 m)   Wt 187 lb 9.6 oz (85.1 kg)   SpO2 99%   BMI 33.23 kg/m?  ? ?  ? ?Physical Exam ?Constitutional:   ?   General: She is not in acute distress. ?Cardiovascular:  ?   Rate and Rhythm: Normal rate.  ?   Pulses: Normal pulses.  ?Pulmonary:  ?   Effort: Pulmonary effort is  normal.  ?Abdominal:  ?   General: There is no distension.  ?   Palpations: There is no mass.  ?   Tenderness: There is abdominal tenderness. There is no guarding.  ?Musculoskeletal:  ?   Right lower leg: No edema.  ?   Left lower leg: No edema.  ?Neurological:  ?   Mental Status: She is alert and oriented to person, place, and time.  ?  ?No results found for any visits on 12/04/21. ?   ?Assessment & Plan:  ?  ?Problem List Items Addressed This Visit   ? ?  ? Other  ? History of pancreatic cancer  ? Hypokalemia  ?  Repeat BMP ? ?  ?  ? Relevant Orders  ? Renal Function Panel  ? Left lower quadrant abdominal pain - Primary  ?  Persistent pain and nausea since 06/2021. Worse postprandial. Reports she is compliant with use of creon with meals. ?Improves with tylenol  '650mg'$  2-3tabs per day ?Continuous weight loss noted ?Wt Readings from Last 3 Encounters:  ?12/04/21 187 lb 9.6 oz (85.1 kg)  ?11/27/21 189 lb 9.6 oz (86 kg)  ?09/14/21 199 lb 3.2 oz (90.4 kg)  ? ?No ETOH since 2022 per aptient ?Daily tobacco use 3cig per day ?D/c use of PPI in 2021. ?No melena, no hematochezia, no constipation or diarrhea. ?She did not schedule CT ABD as directed 1week ago. ? ?Recurrent pancreatic cancer vs GERD? ?Normal lipase, amylase, urinalysis, CMP and CBC ?Resume pantoprazole '40mg'$  BID ?Advised to schedule CT ABD/pelvis. Provided number. ?  ?  ? ?Other Visit Diagnoses   ? ? Dyspepsia      ? Relevant Medications  ? pantoprazole (PROTONIX) 40 MG tablet  ? ?  ? ?Return if symptoms worsen or fail to improve. ? ?  ? ?Wilfred Lacy, NP ? ? ?

## 2021-12-04 NOTE — Assessment & Plan Note (Addendum)
Persistent pain and nausea since 06/2021. Worse postprandial. Reports she is compliant with use of creon with meals. ?Improves with tylenol '650mg'$  2-3tabs per day ?Continuous weight loss noted ?Wt Readings from Last 3 Encounters:  ?12/04/21 187 lb 9.6 oz (85.1 kg)  ?11/27/21 189 lb 9.6 oz (86 kg)  ?09/14/21 199 lb 3.2 oz (90.4 kg)  ? ?No ETOH since 2022 per aptient ?Daily tobacco use 3cig per day ?D/c use of PPI in 2021. ?No melena, no hematochezia, no constipation or diarrhea. ?She did not schedule CT ABD as directed 1week ago. ? ?Recurrent pancreatic cancer vs GERD? ?Normal lipase, amylase, urinalysis, CMP and CBC ?Resume pantoprazole '40mg'$  BID ?Advised to schedule CT ABD/pelvis. Provided number. ?

## 2021-12-04 NOTE — Telephone Encounter (Signed)
Patient states the insurance companygave her a number for [employer] [phone] and informed her that Baldo Ash needed to request something. Pt states she called to find out what was going on and how long it would take and was informed they have not received anything and she wanted to know if we needed anymore of her insurance information.  ?

## 2021-12-06 ENCOUNTER — Telehealth: Payer: Self-pay

## 2021-12-06 NOTE — Telephone Encounter (Signed)
Spoke to patient regarding some lab resutls.  She asked about the CT scan order that was place on 11/27/21 and she hasn't heard anything.   ?Can you please and thank you check into this? ?Thanks. Dm/cma ? ?

## 2021-12-07 NOTE — Telephone Encounter (Signed)
Patient notified VIA phone. Dm/cma  

## 2021-12-13 ENCOUNTER — Telehealth: Payer: Self-pay | Admitting: Nurse Practitioner

## 2021-12-13 ENCOUNTER — Ambulatory Visit
Admission: RE | Admit: 2021-12-13 | Discharge: 2021-12-13 | Disposition: A | Discharge status: Home / Self Care | Payer: BC Managed Care – PPO | Source: Ambulatory Visit | Attending: Nurse Practitioner | Admitting: Nurse Practitioner

## 2021-12-13 DIAGNOSIS — R1032 Left lower quadrant pain: Secondary | ICD-10-CM

## 2021-12-13 DIAGNOSIS — Z8507 Personal history of malignant neoplasm of pancreas: Secondary | ICD-10-CM

## 2021-12-13 IMAGING — CT CT ABD-PELV W/ CM
1 of 3 series · 13 of 32 positions shown, 18 images · IV contrast (APPLIED)
Comparison: [DATE]

CLINICAL DATA: Left lower quadrant abdominal pain for 2 weeks.
History of pancreatic cancer and renal cell cancer.

EXAM:
CT ABDOMEN AND PELVIS WITH CONTRAST
TECHNIQUE: Multidetector CT imaging of the abdomen and pelvis was performed
using the standard protocol following bolus administration of
intravenous contrast.

[Series 2: abd/pelvis w/cm · axial · 0.80mm/px · z∈[-458,-78]mm · 13 of 88 slices shown, 18 images]
[im 6/88  soft-tissue]
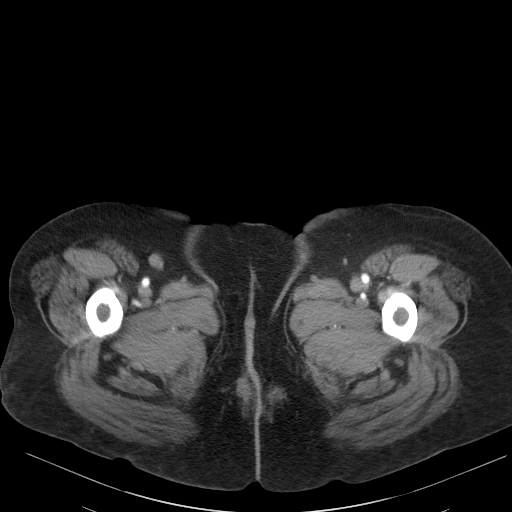
[im 6/88  bone]
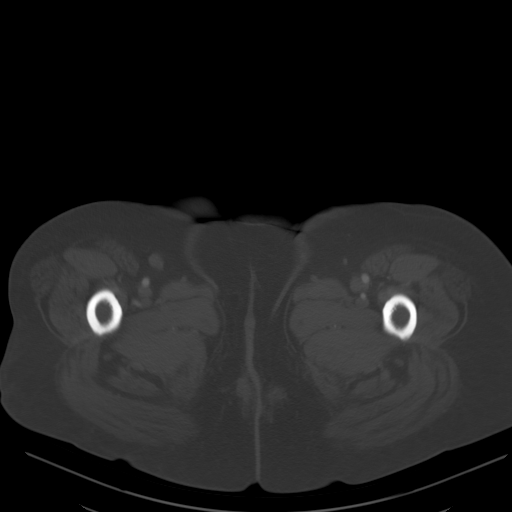
[im 16/88  soft-tissue]
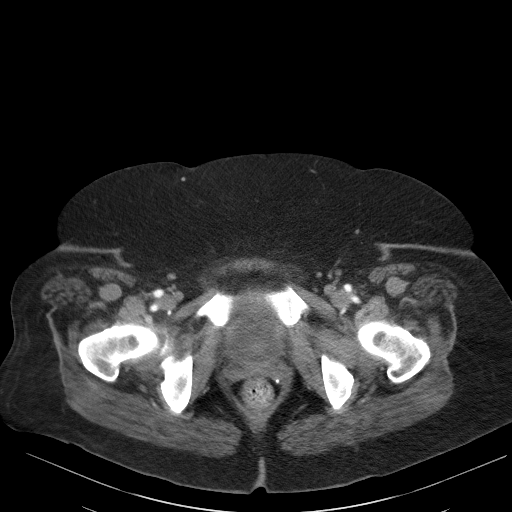
[im 21/88  soft-tissue]
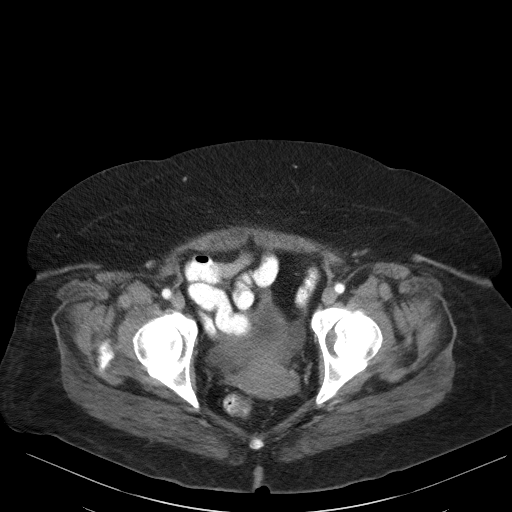
[im 26/88  soft-tissue]
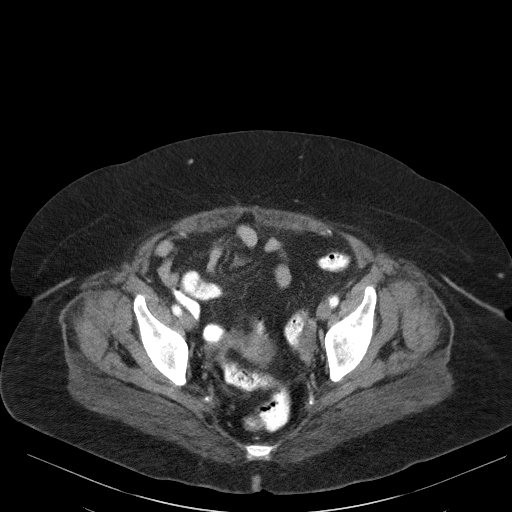
[im 36/88  soft-tissue]
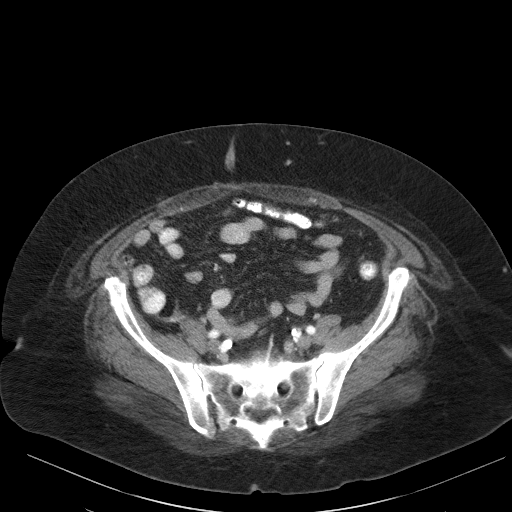
[im 41/88  soft-tissue]
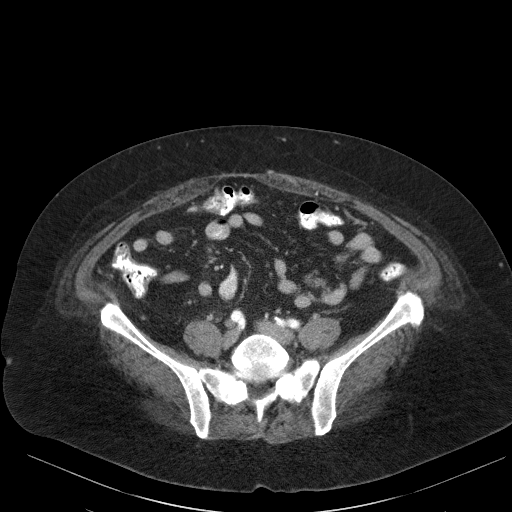
[im 47/88  soft-tissue]
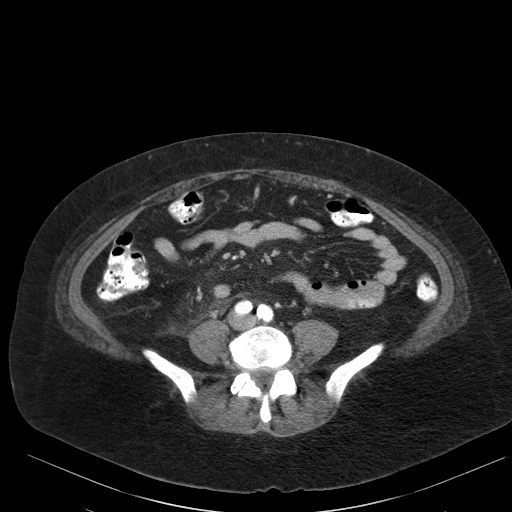
[im 57/88  soft-tissue]
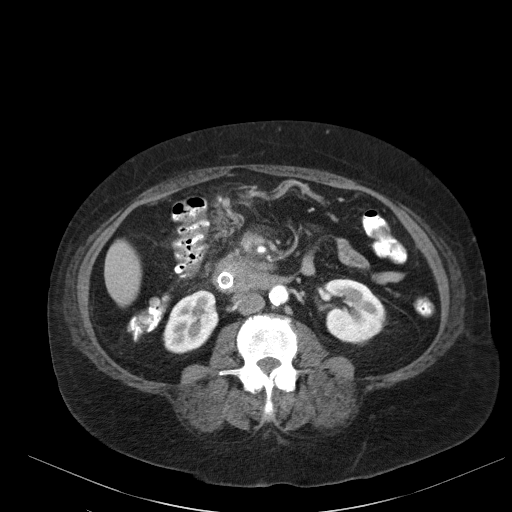
[im 62/88  soft-tissue]
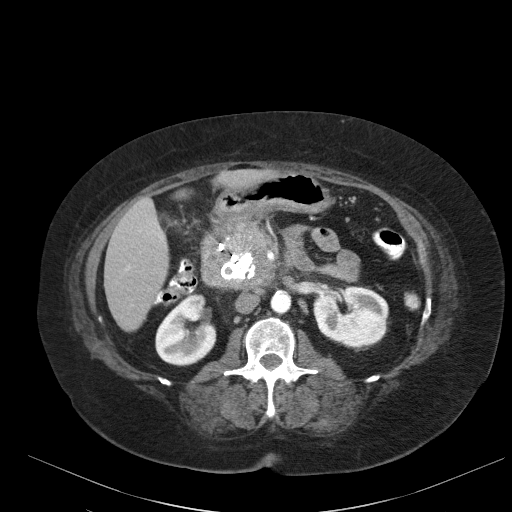
[im 62/88  bone]
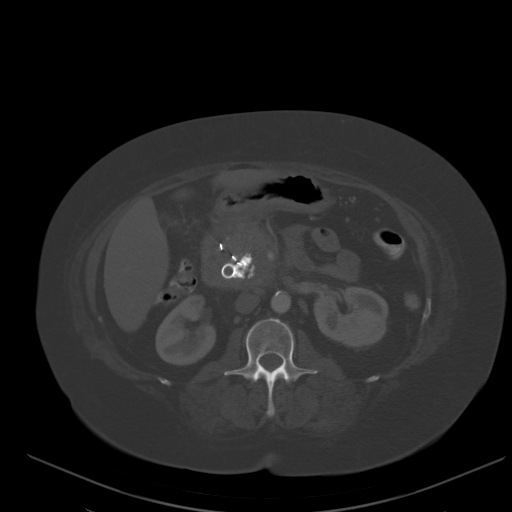
[im 67/88  soft-tissue]
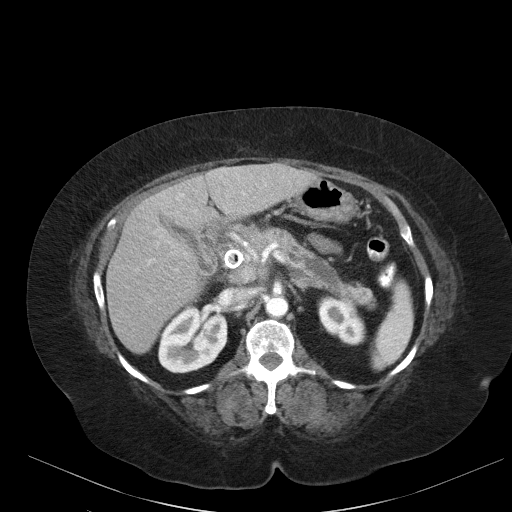
[im 67/88  lung]
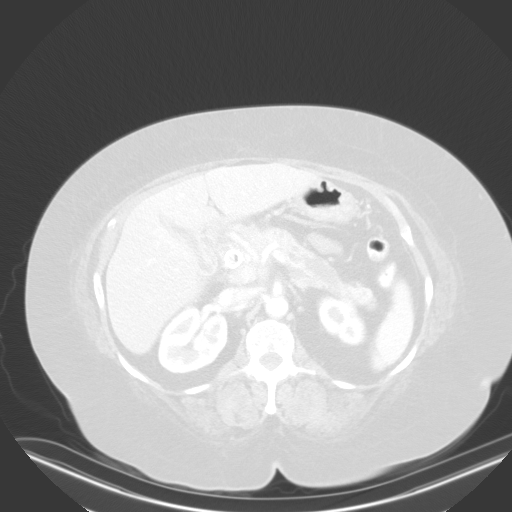
[im 72/88  lung]
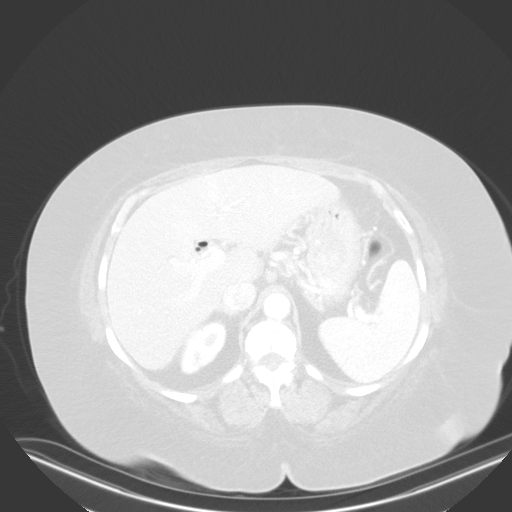
[im 77/88  soft-tissue]
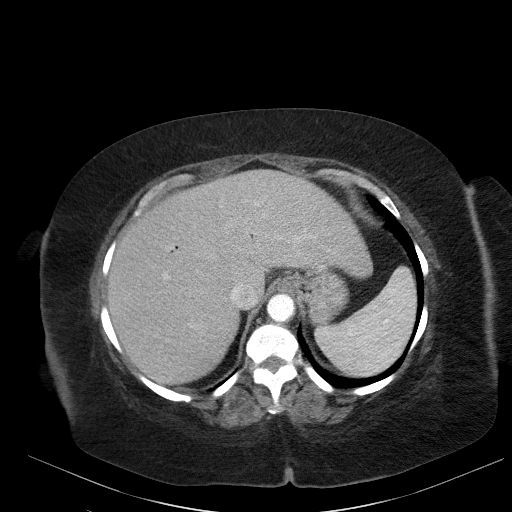
[im 77/88  lung]
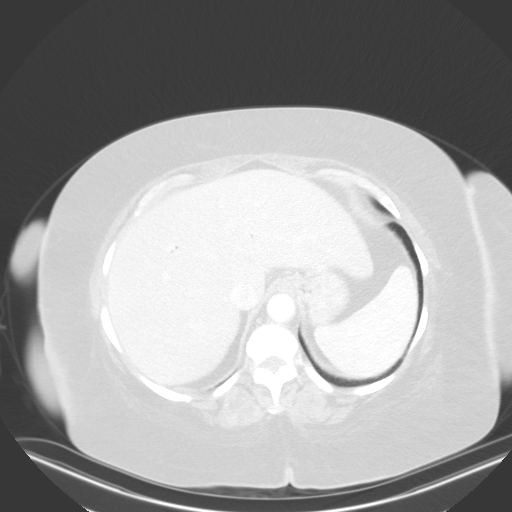
[im 82/88  soft-tissue]
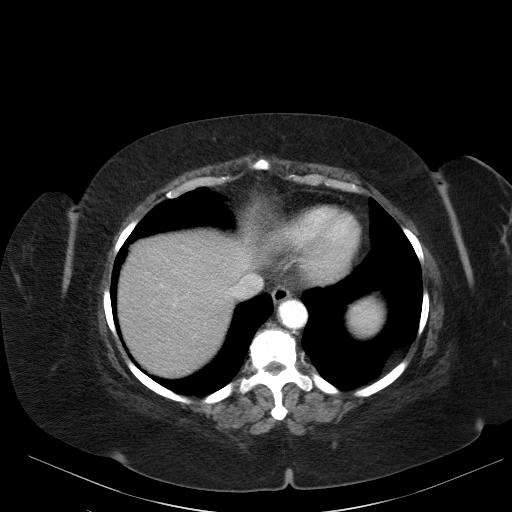
[im 82/88  lung]
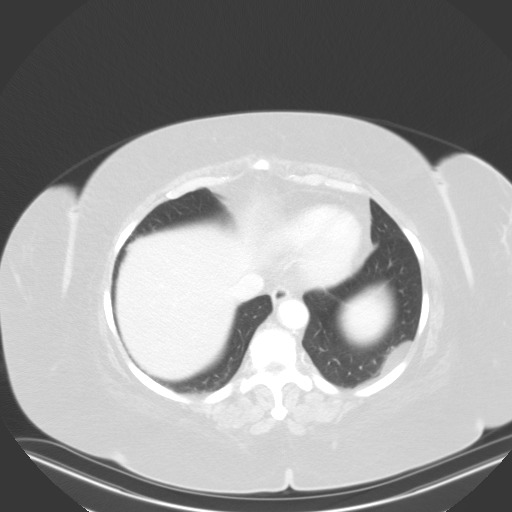

[13 of 32 positions shown; findings below may reference images not displayed]

RADIATION DOSE REDUCTION: This exam was performed according to the
departmental dose-optimization program which includes automated
exposure control, adjustment of the mA and/or kV according to
patient size and/or use of iterative reconstruction technique.

CONTRAST:  100mL [1B] IOPAMIDOL ([1B]) INJECTION 61%
FINDINGS: Lower chest: The heart is normal in size. No pericardial effusion.
Stable scattered aortic calcifications. The distal esophagus is
grossly normal. Surgical changes noted near the GE junction. The
lung bases are clear. No pulmonary nodules. Stable benign pleural
lipoma at the left lung base.

Hepatobiliary: No hepatic lesions to suggest metastatic disease.
Stable pneumobilia related to the biliary stent. High attenuation
material in the gallbladder is likely sludge.

Pancreas: Infiltrating tumor involving the pancreatic head is
difficult to measure. It does appear to be progressive when compared
to the prior CT scan mainly based on progressive tumor surrounding
the SMA. There was a clear fat plane around the SMA on the prior
study but now the vessel is surrounded and slightly deformed. Stable
calcifications in the pancreatic head along with surgical clips and
the biliary stent., the SMA the and the proximal portal vein.
Perigastric and perisplenic collateral vessels are noted.

Spleen: Normal in size.  No focal lesions.

Adrenals/Urinary Tract: Adrenal glands and kidneys are unremarkable
and stable. The bladder is unremarkable.

Stomach/Bowel: The stomach, duodenum, small bowel and colon are
grossly normal. No acute inflammatory process or obstructive
findings.

Vascular/Lymphatic: Stable aortic and branch vessel calcifications.
Progressive tumor surrounding the SMA. New occlusion of the SMV,
splenic vein and proximal portal vein. Periportal and peripancreatic
lymph nodes are larger. 11.5 mm node on image [DATE] previously
measured 6 mm. 7.5 mm node on image [DATE] previously measured 6 mm. 7
mm para-aortic node on image [DATE] previously measured 5 mm. 9 mm
portal node on image [DATE] previously measured 7 mm.

Reproductive: The uterus and ovaries are unremarkable.

Other: Hazy interstitial changes in the small bowel mesentery
possibly due venous congestion from the SMV obstruction.

Musculoskeletal: No significant bony findings.
IMPRESSION: 1. Progressive infiltrating tumor involving the pancreatic head as
detailed above. New occlusion of the SMV, splenic vein and proximal
portal vein.
2. Progressive periportal and peripancreatic lymph nodes.
3. No findings for hepatic metastatic disease.
4. Stable pneumobilia related to the biliary stent.
5. Hazy interstitial changes in the small bowel mesentery possibly
due to venous congestion from the SMV obstruction.
6. High attenuation material in the gallbladder is likely sludge.
7. Aortic atherosclerosis.

Aortic Atherosclerosis ([1B]-[1B]).

## 2021-12-13 MED ORDER — IOPAMIDOL (ISOVUE-300) INJECTION 61%
100.0000 mL | Freq: Once | INTRAVENOUS | Status: AC | PRN
  Administered 2021-12-13: 100 mL via INTRAVENOUS

## 2021-12-13 NOTE — Telephone Encounter (Signed)
Pt just left her CT scan and asked that they send the scan to her onchologist they told her they can't but we could she is requesting that we send a copy to him.DR. Ascencion Dike. Not sur of spelling. ?

## 2021-12-14 ENCOUNTER — Telehealth: Payer: Self-pay | Admitting: Nurse Practitioner

## 2021-12-14 NOTE — Telephone Encounter (Signed)
LVM. Need to discuss CT ABD results ?

## 2021-12-14 NOTE — Telephone Encounter (Signed)
LVM to CB on cell phone ?Called home number twice, had busy signal both times ?Need to discuss CT results ?

## 2021-12-17 NOTE — Telephone Encounter (Signed)
Pt has called again she said you keep missing each other and to please call on her cell [phone] ?

## 2021-12-18 NOTE — Telephone Encounter (Signed)
Pt is needing a call back to discuss her CT results. She does realize that she missed her call. She is fine with these results being left on her answering machine at [phone]. She wants to make sure these results have been sent to Dr. Burney Gauze who she has an appointment with on 12/24/21. He is on her appointment desk in [place], I let her know this.  ?

## 2021-12-18 NOTE — Telephone Encounter (Signed)
Left detail voice message about CT ABD/pelvis results. Advised to maintain upcoming appt with Dr. Jamie Kato on 12/24/2021. ?Advised to call back or schedule a F2F appt if additional questions ?

## 2021-12-19 ENCOUNTER — Telehealth: Payer: Self-pay | Admitting: Hematology & Oncology

## 2021-12-19 NOTE — Telephone Encounter (Signed)
Called to schedule per 5/3 sch msg , left voicemail for patient to call us back to schedule 5/9 ?

## 2021-12-20 ENCOUNTER — Ambulatory Visit
Admission: RE | Admit: 2021-12-20 | Discharge: 2021-12-20 | Disposition: A | Discharge status: Home / Self Care | Payer: BC Managed Care – PPO | Source: Ambulatory Visit | Attending: Nurse Practitioner | Admitting: Nurse Practitioner

## 2021-12-20 DIAGNOSIS — Z1231 Encounter for screening mammogram for malignant neoplasm of breast: Secondary | ICD-10-CM

## 2021-12-20 IMAGING — MG MM DIGITAL SCREENING BILAT W/ TOMO AND CAD
8 series · 8 of 24 positions shown · non-contrast
Comparison: Previous exam(s).

CLINICAL DATA: Screening.

EXAM:
DIGITAL SCREENING BILATERAL MAMMOGRAM WITH TOMOSYNTHESIS AND CAD
TECHNIQUE: Bilateral screening digital craniocaudal and mediolateral oblique
mammograms were obtained. Bilateral screening digital breast
tomosynthesis was performed. The images were evaluated with
computer-aided detection.

[R MLO synth-2D]
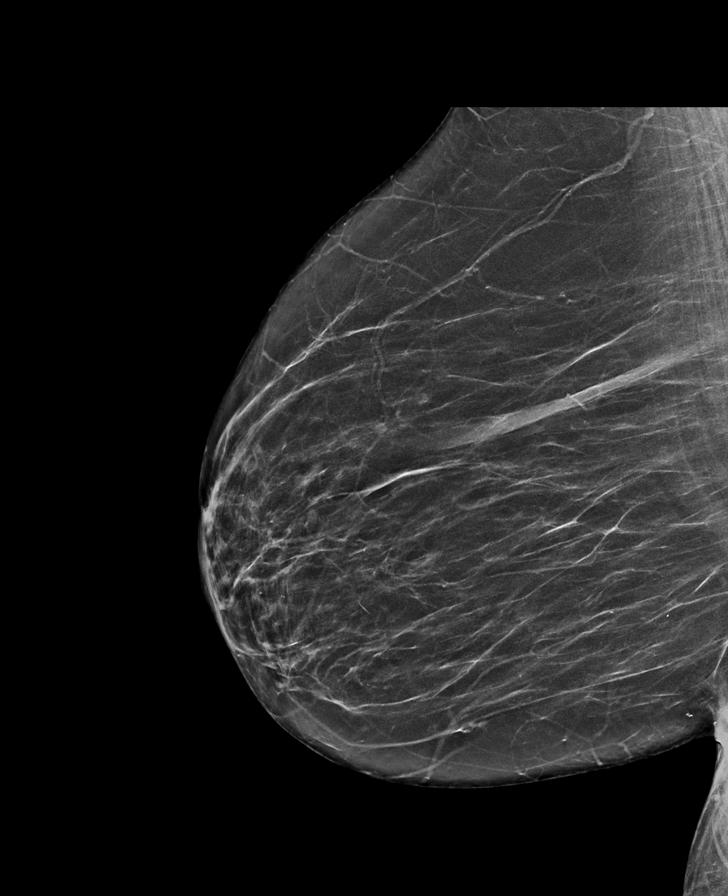

[L MLO synth-2D]
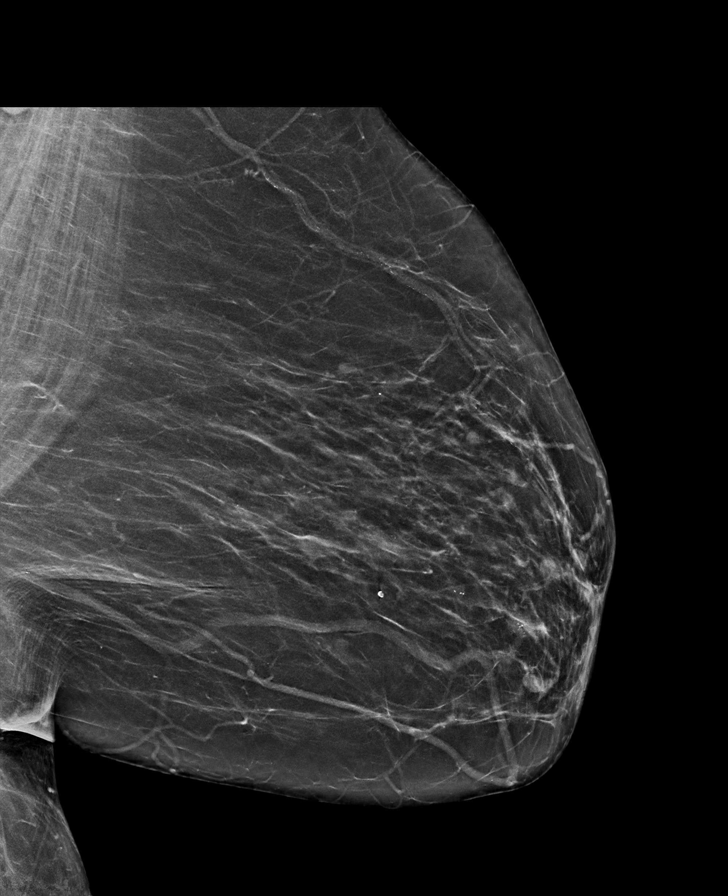

[L CC synth-2D]
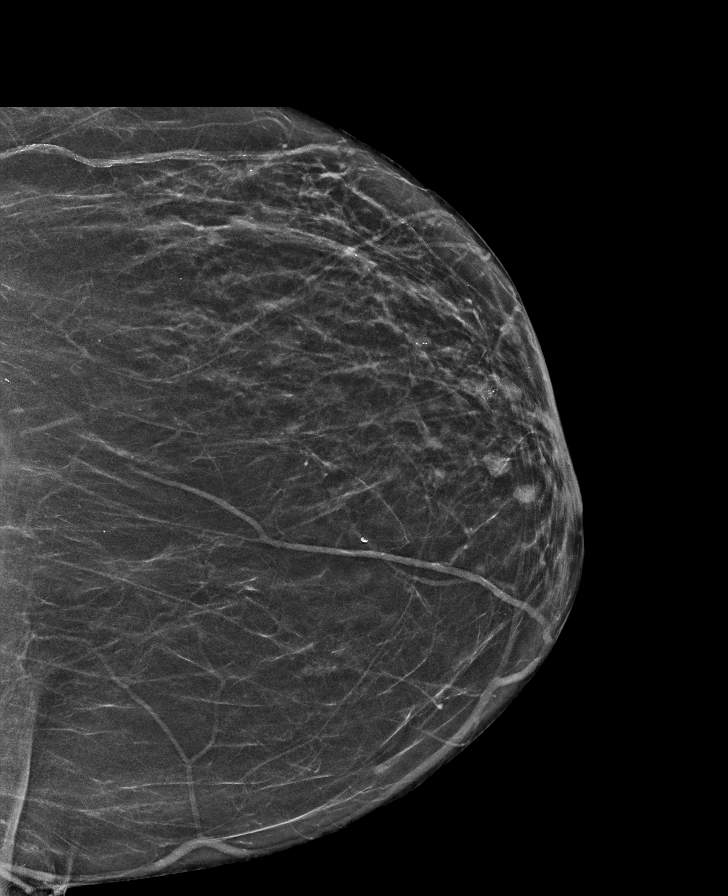

[R CC synth-2D]
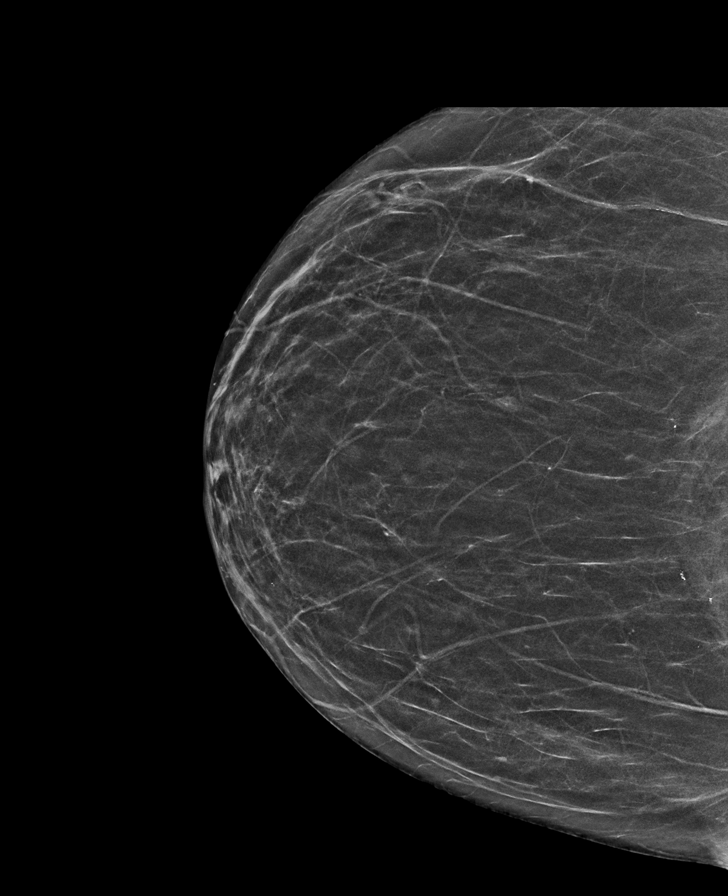

[L CC tomo · tomo slice 27/53.0]
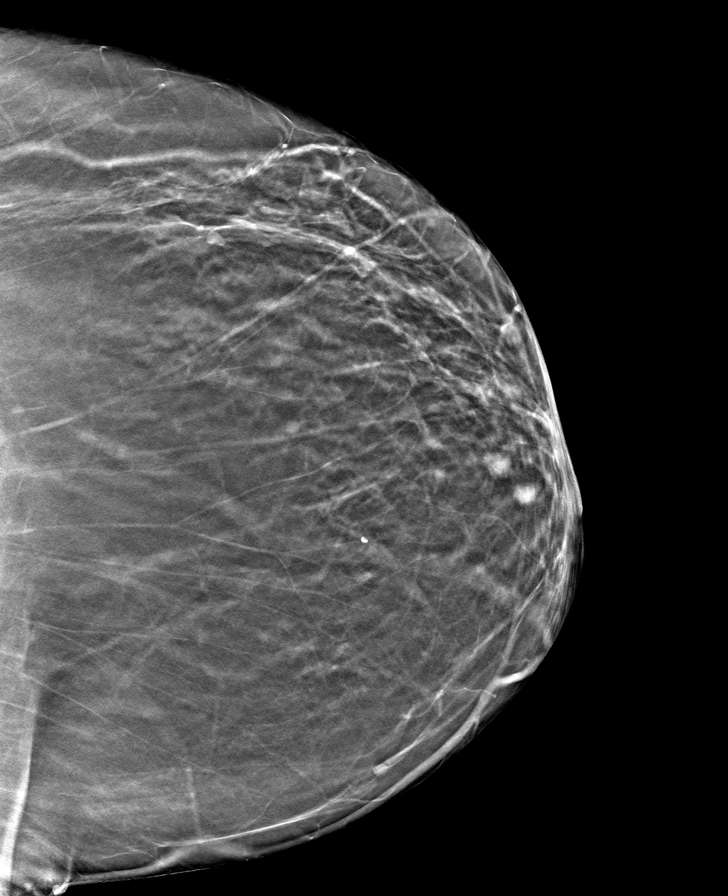

[R CC tomo · tomo slice 25/50.0]
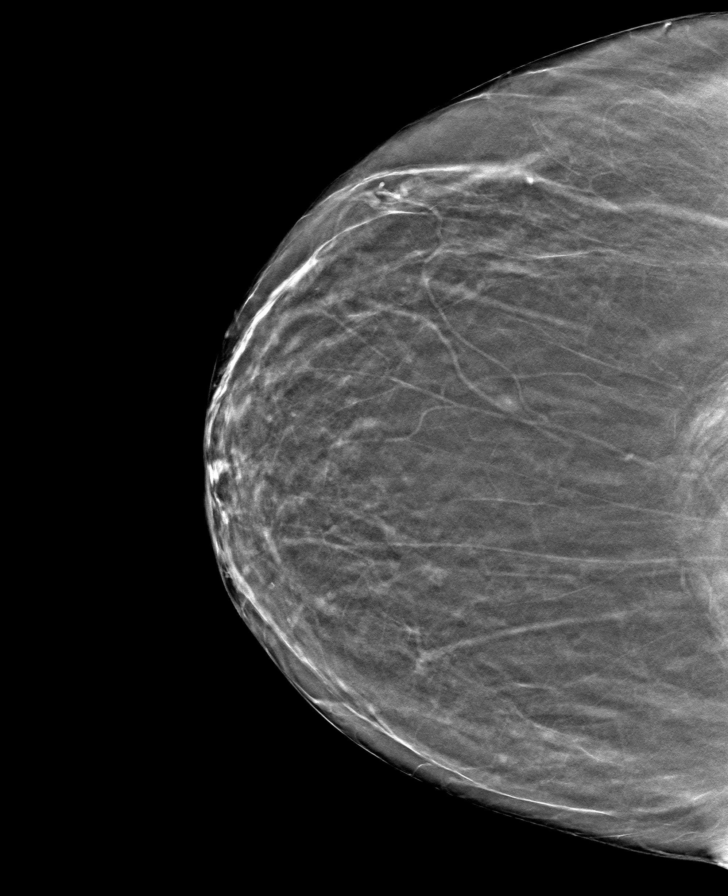

[L MLO tomo · tomo slice 34/67.0]
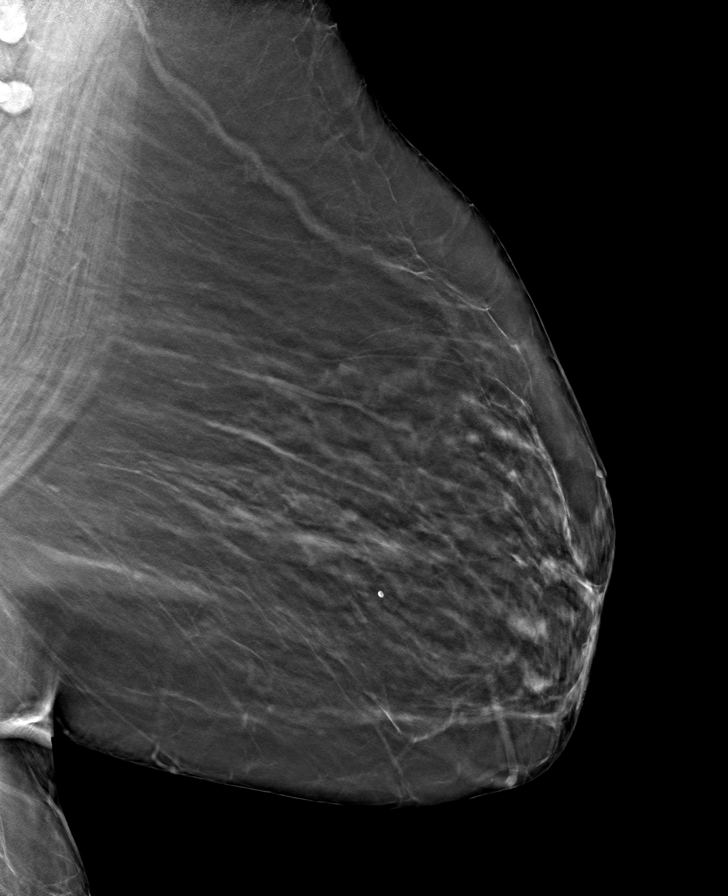

[R MLO tomo · tomo slice 31/61.0]
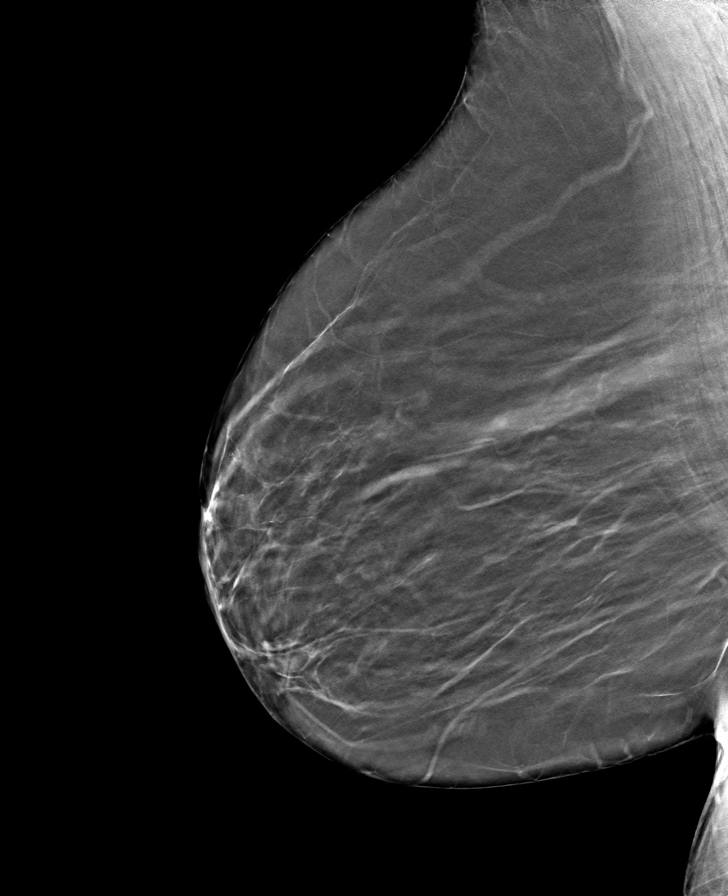

[8 of 24 positions shown; findings below may reference images not displayed]

ACR Breast Density Category b: There are scattered areas of
fibroglandular density.
FINDINGS: There are no findings suspicious for malignancy.
IMPRESSION: No mammographic evidence of malignancy. A result letter of this
screening mammogram will be mailed directly to the patient.

RECOMMENDATION:
Screening mammogram in one year. (Code:[BY])

BI-RADS CATEGORY  1: Negative.

## 2021-12-21 ENCOUNTER — Other Ambulatory Visit: Payer: Self-pay

## 2021-12-21 DIAGNOSIS — C250 Malignant neoplasm of head of pancreas: Secondary | ICD-10-CM

## 2021-12-24 ENCOUNTER — Inpatient Hospital Stay: Payer: BC Managed Care – PPO

## 2021-12-24 ENCOUNTER — Encounter: Payer: Self-pay | Admitting: Hematology & Oncology

## 2021-12-24 ENCOUNTER — Inpatient Hospital Stay: Payer: BC Managed Care – PPO | Attending: Hematology & Oncology | Admitting: Hematology & Oncology

## 2021-12-24 VITALS — BP 139/85 | HR 97 | Resp 17

## 2021-12-24 VITALS — BP 140/89 | HR 101 | Temp 99.9°F | Resp 20 | Wt 182.0 lb

## 2021-12-24 DIAGNOSIS — Z923 Personal history of irradiation: Secondary | ICD-10-CM | POA: Insufficient documentation

## 2021-12-24 DIAGNOSIS — C250 Malignant neoplasm of head of pancreas: Secondary | ICD-10-CM

## 2021-12-24 DIAGNOSIS — E860 Dehydration: Secondary | ICD-10-CM | POA: Insufficient documentation

## 2021-12-24 DIAGNOSIS — Z7901 Long term (current) use of anticoagulants: Secondary | ICD-10-CM | POA: Diagnosis not present

## 2021-12-24 DIAGNOSIS — Z9221 Personal history of antineoplastic chemotherapy: Secondary | ICD-10-CM | POA: Insufficient documentation

## 2021-12-24 DIAGNOSIS — K55059 Acute (reversible) ischemia of intestine, part and extent unspecified: Secondary | ICD-10-CM | POA: Insufficient documentation

## 2021-12-24 DIAGNOSIS — C259 Malignant neoplasm of pancreas, unspecified: Secondary | ICD-10-CM | POA: Insufficient documentation

## 2021-12-24 DIAGNOSIS — D649 Anemia, unspecified: Secondary | ICD-10-CM | POA: Diagnosis present

## 2021-12-24 LAB — CMP (CANCER CENTER ONLY)
ALT: 10 U/L (ref 0–44)
AST: 13 U/L — ABNORMAL LOW (ref 15–41)
Albumin: 3.7 g/dL (ref 3.5–5.0)
Alkaline Phosphatase: 65 U/L (ref 38–126)
Anion gap: 9 (ref 5–15)
BUN: 7 mg/dL — ABNORMAL LOW (ref 8–23)
CO2: 29 mmol/L (ref 22–32)
Calcium: 9.6 mg/dL (ref 8.9–10.3)
Chloride: 93 mmol/L — ABNORMAL LOW (ref 98–111)
Creatinine: 0.79 mg/dL (ref 0.44–1.00)
GFR, Estimated: >60 mL/min (ref >60)
Glucose, Bld: 114 mg/dL — ABNORMAL HIGH (ref 70–99)
Potassium: 3.6 mmol/L (ref 3.5–5.1)
Sodium: 131 mmol/L — ABNORMAL LOW (ref 135–145)
Total Bilirubin: 0.5 mg/dL (ref 0.3–1.2)
Total Protein: 7.7 g/dL (ref 6.5–8.1)

## 2021-12-24 LAB — CBC WITH DIFFERENTIAL (CANCER CENTER ONLY)
Abs Immature Granulocytes: 0.13 10*3/uL — ABNORMAL HIGH (ref 0.00–0.07)
Basophils Absolute: 0.1 10*3/uL (ref 0.0–0.1)
Basophils Relative: 0 %
Eosinophils Absolute: 0 10*3/uL (ref 0.0–0.5)
Eosinophils Relative: 0 %
HCT: 32 % — ABNORMAL LOW (ref 36.0–46.0)
Hemoglobin: 11 g/dL — ABNORMAL LOW (ref 12.0–15.0)
Immature Granulocytes: 1 %
Lymphocytes Relative: 5 %
Lymphs Abs: 1.1 10*3/uL (ref 0.7–4.0)
MCH: 27.8 pg (ref 26.0–34.0)
MCHC: 34.4 g/dL (ref 30.0–36.0)
MCV: 81 fL (ref 80.0–100.0)
Monocytes Absolute: 1.1 10*3/uL — ABNORMAL HIGH (ref 0.1–1.0)
Monocytes Relative: 5 %
Neutro Abs: 18.6 10*3/uL — ABNORMAL HIGH (ref 1.7–7.7)
Neutrophils Relative %: 89 %
Platelet Count: 411 10*3/uL — ABNORMAL HIGH (ref 150–400)
RBC: 3.95 MIL/uL (ref 3.87–5.11)
RDW: 13 % (ref 11.5–15.5)
WBC Count: 21 10*3/uL — ABNORMAL HIGH (ref 4.0–10.5)
nRBC: 0 % (ref 0.0–0.2)

## 2021-12-24 LAB — LACTATE DEHYDROGENASE: LDH: 132 U/L (ref 98–192)

## 2021-12-24 LAB — PREALBUMIN: Prealbumin: 6.4 mg/dL — ABNORMAL LOW (ref 18–38)

## 2021-12-24 MED ORDER — APIXABAN (ELIQUIS) VTE STARTER PACK (10MG AND 5MG): ORAL_TABLET | ORAL | 0 refills | Status: DC

## 2021-12-24 MED ORDER — SODIUM CHLORIDE 0.9 % IV SOLN
8.0000 mg | Freq: Once | INTRAVENOUS | Status: AC
  Administered 2021-12-24: 8 mg via INTRAVENOUS
  Filled 2021-12-24: qty 4

## 2021-12-24 MED ORDER — PANTOPRAZOLE SODIUM 40 MG PO TBEC: 40.0000 mg | DELAYED_RELEASE_TABLET | Freq: Every day | ORAL | 5 refills | Status: DC

## 2021-12-24 MED ORDER — SODIUM CHLORIDE 0.9 % IV SOLN
40.0000 mg | Freq: Once | INTRAVENOUS | Status: AC
  Administered 2021-12-24: 40 mg via INTRAVENOUS
  Filled 2021-12-24: qty 4

## 2021-12-24 MED ORDER — FAMOTIDINE 20 MG IN NS 100 ML IVPB: 20.0000 mg | Freq: Two times a day (BID) | INTRAVENOUS | Status: DC

## 2021-12-24 MED ORDER — HYDROMORPHONE HCL 1 MG/ML IJ SOLN
1.0000 mg | Freq: Once | INTRAMUSCULAR | Status: AC
  Administered 2021-12-24: 1 mg via INTRAVENOUS
  Filled 2021-12-24: qty 1

## 2021-12-24 MED ORDER — SODIUM CHLORIDE 0.9 % IV SOLN: INTRAVENOUS | Status: DC

## 2021-12-24 MED ORDER — HYDROMORPHONE HCL 1 MG/ML IJ SOLN
1.0000 mg | Freq: Once | INTRAMUSCULAR | Status: DC
  Filled 2021-12-24: qty 1

## 2021-12-24 MED ORDER — SODIUM CHLORIDE 0.9 % IV SOLN
1000.0000 mL | Freq: Once | INTRAVENOUS | Status: AC
  Administered 2021-12-24: 1000 mL via INTRAVENOUS

## 2021-12-24 MED ORDER — FAMOTIDINE IN NACL 20-0.9 MG/50ML-% IV SOLN: 40.0000 mg | Freq: Once | INTRAVENOUS | Status: DC

## 2021-12-24 MED ORDER — SODIUM CHLORIDE 0.9 % IV SOLN
40.0000 mg | Freq: Once | INTRAVENOUS | Status: DC
  Filled 2021-12-24: qty 4

## 2021-12-24 MED ORDER — HYDROMORPHONE HCL 2 MG PO TABS: 2.0000 mg | ORAL_TABLET | Freq: Four times a day (QID) | ORAL | 0 refills | Status: DC | PRN

## 2021-12-24 MED ORDER — SODIUM CHLORIDE 0.9 % IV SOLN: INTRAVENOUS | Status: AC

## 2021-12-24 MED ORDER — HYDROMORPHONE HCL 1 MG/ML IJ SOLN
1.0000 mg | INTRAMUSCULAR | Status: DC | PRN
  Administered 2021-12-24: 1 mg via INTRAVENOUS

## 2021-12-24 NOTE — Progress Notes (Signed)
DISCONTINUE ON PATHWAY REGIMEN - Pancreatic Adenocarcinoma ? ? ?  A cycle is every 14 days: ?    Oxaliplatin  ?    Leucovorin  ?    Irinotecan  ?    Fluorouracil  ? ?**Always confirm dose/schedule in your pharmacy ordering system** ? ?REASON: Disease Progression ?PRIOR TREATMENT: PANOS94: mFOLFIRINOX q14 Days x 4 Cycles ?TREATMENT RESPONSE: Progressive Disease (PD) ? ?START ON PATHWAY REGIMEN - Pancreatic Adenocarcinoma ? ? ?  A cycle is every 28 days: ?    Nab-paclitaxel (protein bound)  ?    Gemcitabine  ? ?**Always confirm dose/schedule in your pharmacy ordering system** ? ?Patient Characteristics: ?Locally Advanced, Anatomically Unresectable, M0, Second Line, MSS/pMMR or MSI Unknown, Fluoropyrimidine-Based Therapy  First Line ?Therapeutic Status: Locally Advanced, Anatomically Unresectable, M0 ?Line of Therapy: Second Line ?Microsatellite/Mismatch Repair Status: MSS/pMMR ?Intent of Therapy: ?Non-Curative / Palliative Intent, Discussed with Patient ?

## 2021-12-24 NOTE — Patient Instructions (Signed)

## 2021-12-24 NOTE — Progress Notes (Signed)
?Hematology and Oncology Follow Up Visit ? ?Melanie Alvarez ?[id number] ?19-Apr-1956 66 y.o. ?12/24/2021 ? ? 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 ?Current Therapy:   ?FOLFIRINOX -- started on 10/13/2019, s/p cycle #4 - completed on 11/2019 ?SBRT -- s/p 33 Gy -- completed on 02/24/2020 ?Eliquis 5 mg p.o. twice daily-started on 12/24/2021 ?Abraxane/Gemzar --start cycle #1 on 01/01/2022 ?  ?Interim History:  Melanie Alvarez is here today for follow-up.  Unfortunately, looks like we have a real problem now.  So it looks like she has recurrent disease.  She recently went to the emergency room and had CT scan because of abdominal pain.  The CT scan was done on 12/13/2021.  This showed progressive tumor involving the pancreatic head.  There was periportal and peripancreatic adenopathy.  She had occlusion of the SMV, splenic vein and proximal portal vein.  ? ?She is having quite a bit of abdominal pain.  She is not able to eat much. ? ?I am really not all that surprised by the fact that this cancer is recurred. ? ?I just hate the fact that she is having some was difficulty. ? ?She has had no fever.  She has had no diarrhea.  She has had no vomiting. ? ?There is been no obvious bleeding. ? ?Of note, her last CA 19-9 was less than 2. ? ?She is lost a little bit of weight.  She is not able to work. ? ?She has had no problems urinating. ? ?She has had no cough or shortness of breath. ? ?Overall, I would have to say that her performance status right now is ECOG 2. ? ?Medications:  ?Allergies as of 12/24/2021   ? ?   Reactions  ? Lisinopril Swelling  ? Facial and upper lip  ? ?  ? ?  ?Medication List  ?  ? ?  ? Accurate as of Dec 24, 2021  4:45 PM. If you have any questions, ask your nurse or doctor.  ?  ?  ? ?  ? ?amLODipine 10 MG tablet ?Commonly known as: NORVASC ?Take 1 tablet (10 mg total) by mouth at bedtime. ?  ?Apixaban Starter Pack  ('10mg'$  and '5mg'$ ) ?Commonly known as: ELIQUIS STARTER PACK ?Take as directed on package: start with two-'5mg'$  tablets twice daily for 7 days. On day 8, switch to one-'5mg'$  tablet twice daily. ?Started by: Volanda Napoleon, MD ?  ?atorvastatin 20 MG tablet ?Commonly known as: LIPITOR ?Take 1 tablet (20 mg total) by mouth at bedtime. ?  ?calcium-vitamin D 500-200 MG-UNIT tablet ?Commonly known as: OSCAL WITH D ?Take 1 tablet by mouth. ?  ?Creon 36000 UNITS Cpep capsule ?Generic drug: lipase/protease/amylase ?TAKE 2 CAPSULES BY MOUTH THREE TIMES DAILY BEFORE MEAL(S) ?  ?fluticasone 50 MCG/ACT nasal spray ?Commonly known as: FLONASE ?Place 2 sprays into both nostrils daily. ?  ?glipiZIDE 5 MG 24 hr tablet ?Commonly known as: GLUCOTROL XL ?Take 1 tablet (5 mg total) by mouth daily with breakfast. ?  ?glucose blood test strip ?Test blood glucose once daily. One Touch Verio test strips ?  ?HYDROmorphone 2 MG tablet ?Commonly known as: Dilaudid ?Take 1 tablet (2 mg total) by mouth every 6 (six) hours as needed for severe pain. ?Started by: Volanda Napoleon, MD ?  ?metFORMIN 750 MG 24 hr tablet ?Commonly known as: Glucophage XR ?Take 1 tablet (750 mg total) by mouth daily with breakfast. ?  ?metoprolol succinate 25 MG  24 hr tablet ?Commonly known as: TOPROL-XL ?TAKE 1 TABLET BY MOUTH ONCE DAILY ?  ?olmesartan 5 MG tablet ?Commonly known as: BENICAR ?Take 1 tablet (5 mg total) by mouth daily. ?  ?pantoprazole 40 MG tablet ?Commonly known as: PROTONIX ?Take 1 tablet (40 mg total) by mouth 2 (two) times daily for 14 days. ?What changed: Another medication with the same name was added. Make sure you understand how and when to take each. ?Changed by: Volanda Napoleon, MD ?  ?pantoprazole 40 MG tablet ?Commonly known as: Protonix ?Take 1 tablet (40 mg total) by mouth daily. ?What changed: You were already taking a medication with the same name, and this prescription was added. Make sure you understand how and when to take each. ?Changed by:  Volanda Napoleon, MD ?  ?potassium chloride SA 20 MEQ tablet ?Commonly known as: KLOR-CON M ?Take 1 tablet (20 mEq total) by mouth 2 (two) times daily. ?  ? ?  ? ? ?Allergies:  ?Allergies  ?Allergen Reactions  ? Lisinopril Swelling  ?  Facial and upper lip  ? ? ?Past Medical History, Surgical history, Social history, and Family History were reviewed and updated. ? ?Review of Systems: ?Review of Systems  ?Constitutional: Negative.   ?HENT: Negative.    ?Eyes: Negative.   ?Respiratory: Negative.    ?Cardiovascular: Negative.   ?Gastrointestinal: Negative.   ?Genitourinary: Negative.   ?Musculoskeletal: Negative.   ?Skin: Negative.   ?Neurological: Negative.   ?Endo/Heme/Allergies: Negative.   ?Psychiatric/Behavioral: Negative.    ?  ? ?Physical Exam: ? weight is 182 lb (82.6 kg). Her oral temperature is 99.9 ?F (37.7 ?C). Her blood pressure is 140/89 and her pulse is 101 (abnormal). Her respiration is 20 and oxygen saturation is 100%.  ? ?Wt Readings from Last 3 Encounters:  ?12/24/21 182 lb (82.6 kg)  ?12/04/21 187 lb 9.6 oz (85.1 kg)  ?11/27/21 189 lb 9.6 oz (86 kg)  ? ? ?Physical Exam ?Vitals reviewed.  ?HENT:  ?   Head: Normocephalic and atraumatic.  ?Eyes:  ?   Pupils: Pupils are equal, round, and reactive to light.  ?Cardiovascular:  ?   Rate and Rhythm: Normal rate and regular rhythm.  ?   Heart sounds: Normal heart sounds.  ?Pulmonary:  ?   Effort: Pulmonary effort is normal.  ?   Breath sounds: Normal breath sounds.  ?Abdominal:  ?   General: Bowel sounds are normal.  ?   Palpations: Abdomen is soft.  ?   Comments: Abdominal exam is slightly distended.  There is some tenderness to palpation.  Bowel sounds are decreased.  She has no obvious fluid wave.  There is no obvious abdominal mass.  I cannot palpate her liver or spleen.  ?Musculoskeletal:     ?   General: No tenderness or deformity. Normal range of motion.  ?   Cervical back: Normal range of motion.  ?   Comments: Extremities shows decreased range of  motion of the left shoulder.  There is decreased abduction.  She has very little rotation of the left shoulder.  ?Lymphadenopathy:  ?   Cervical: No cervical adenopathy.  ?Skin: ?   General: Skin is warm and dry.  ?   Findings: No erythema or rash.  ?Neurological:  ?   Mental Status: She is alert and oriented to person, place, and time.  ?Psychiatric:     ?   Behavior: Behavior normal.     ?   Thought Content: Thought content  normal.     ?   Judgment: Judgment normal.  ? ? ? ?Lab Results  ?Component Value Date  ? WBC 21.0 (H) 12/24/2021  ? HGB 11.0 (L) 12/24/2021  ? HCT 32.0 (L) 12/24/2021  ? MCV 81.0 12/24/2021  ? PLT 411 (H) 12/24/2021  ? ?No results found for: FERRITIN, IRON, TIBC, UIBC, IRONPCTSAT ?Lab Results  ?Component Value Date  ? RBC 3.95 12/24/2021  ? ?No results found for: KPAFRELGTCHN, LAMBDASER, KAPLAMBRATIO ?No results found for: IGGSERUM, IGA, IGMSERUM ?No results found for: TOTALPROTELP, ALBUMINELP, A1GS, A2GS, BETS, BETA2SER, GAMS, MSPIKE, SPEI ?  Chemistry   ?   ?Component Value Date/Time  ? NA 131 (L) 12/24/2021 0816  ? K 3.6 12/24/2021 0816  ? CL 93 (L) 12/24/2021 0816  ? CO2 29 12/24/2021 0816  ? BUN 7 (L) 12/24/2021 0816  ? CREATININE 0.79 12/24/2021 0816  ? CREATININE 0.78 03/23/2018 1018  ?    ?Component Value Date/Time  ? CALCIUM 9.6 12/24/2021 0816  ? ALKPHOS 65 12/24/2021 0816  ? AST 13 (L) 12/24/2021 0816  ? ALT 10 12/24/2021 0816  ? ALT 40 (H) 11/25/2017 0947  ? BILITOT 0.5 12/24/2021 0816  ?  ? ? ? ?Impression and Plan: Ms. Niese is a very pleasant 66 yo [place] American female with adenocarcinoma of the pancreas, ampullary carcinoma.  She received upfront chemotherapy and radiation therapy.  She did not want any surgery. ? ?She now has recurrent disease.  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 ? ?She needs to be on blood thinner.  We will try her on Eliquis.  We will give her the loading dose of Eliquis at 10 mg p.o. twice daily for 7 days and then have her on 5  mg p.o. twice daily. ? ?She will need systemic chemotherapy.  I think that a reasonable option for her would be Abraxane/gemcitabine.  I think this would be tolerable.  I think this should work. ? ?In the p

## 2021-12-25 ENCOUNTER — Encounter: Payer: Self-pay | Admitting: Hematology & Oncology

## 2021-12-25 LAB — CANCER ANTIGEN 19-9: CA 19-9: <2 U/mL (ref 0–35)

## 2021-12-26 ENCOUNTER — Other Ambulatory Visit: Payer: Self-pay

## 2021-12-26 DIAGNOSIS — C250 Malignant neoplasm of head of pancreas: Secondary | ICD-10-CM

## 2021-12-26 MED ORDER — ONDANSETRON HCL 8 MG PO TABS: 8.0000 mg | ORAL_TABLET | Freq: Two times a day (BID) | ORAL | 1 refills | Status: DC | PRN

## 2021-12-26 MED ORDER — PROCHLORPERAZINE MALEATE 10 MG PO TABS: 10.0000 mg | ORAL_TABLET | Freq: Four times a day (QID) | ORAL | 1 refills | Status: DC | PRN

## 2021-12-26 MED ORDER — LIDOCAINE-PRILOCAINE 2.5-2.5 % EX CREA: TOPICAL_CREAM | CUTANEOUS | 3 refills | Status: DC

## 2021-12-26 NOTE — Progress Notes (Signed)
Pt dropped off fmla formsnd asked to go ahead and pick up her nausea meds for her tx. Released medication and confirmed pharmacy with pt. Fmla forms sent to be completed. Pt aware.  ?

## 2021-12-28 ENCOUNTER — Other Ambulatory Visit: Payer: Self-pay | Admitting: Internal Medicine

## 2021-12-28 DIAGNOSIS — C250 Malignant neoplasm of head of pancreas: Secondary | ICD-10-CM

## 2021-12-31 ENCOUNTER — Ambulatory Visit (HOSPITAL_COMMUNITY)
Admission: RE | Admit: 2021-12-31 | Discharge: 2021-12-31 | Disposition: A | Discharge status: Home / Self Care | Payer: BC Managed Care – PPO | Source: Ambulatory Visit | Attending: Hematology & Oncology | Admitting: Hematology & Oncology

## 2021-12-31 ENCOUNTER — Other Ambulatory Visit: Payer: Self-pay | Admitting: Hematology & Oncology

## 2021-12-31 ENCOUNTER — Encounter (HOSPITAL_COMMUNITY): Payer: Self-pay

## 2021-12-31 ENCOUNTER — Other Ambulatory Visit: Payer: Self-pay

## 2021-12-31 DIAGNOSIS — E119 Type 2 diabetes mellitus without complications: Secondary | ICD-10-CM | POA: Insufficient documentation

## 2021-12-31 DIAGNOSIS — C250 Malignant neoplasm of head of pancreas: Secondary | ICD-10-CM

## 2021-12-31 DIAGNOSIS — I10 Essential (primary) hypertension: Secondary | ICD-10-CM | POA: Insufficient documentation

## 2021-12-31 DIAGNOSIS — K7460 Unspecified cirrhosis of liver: Secondary | ICD-10-CM | POA: Diagnosis not present

## 2021-12-31 DIAGNOSIS — C259 Malignant neoplasm of pancreas, unspecified: Secondary | ICD-10-CM | POA: Insufficient documentation

## 2021-12-31 HISTORY — PX: IR IMAGING GUIDED PORT INSERTION: IMG5740

## 2021-12-31 LAB — GLUCOSE, CAPILLARY: Glucose-Capillary: 126 mg/dL — ABNORMAL HIGH (ref 70–99)

## 2021-12-31 MED ORDER — LIDOCAINE-EPINEPHRINE 1 %-1:100000 IJ SOLN
INTRAMUSCULAR | Status: AC | PRN
  Administered 2021-12-31: 5 mL via INTRADERMAL

## 2021-12-31 MED ORDER — MIDAZOLAM HCL 2 MG/2ML IJ SOLN
INTRAMUSCULAR | Status: AC
  Filled 2021-12-31: qty 2

## 2021-12-31 MED ORDER — LIDOCAINE-EPINEPHRINE 1 %-1:100000 IJ SOLN
INTRAMUSCULAR | Status: AC
  Filled 2021-12-31: qty 1

## 2021-12-31 MED ORDER — MIDAZOLAM HCL 2 MG/2ML IJ SOLN
INTRAMUSCULAR | Status: AC | PRN
  Administered 2021-12-31: 1 mg via INTRAVENOUS

## 2021-12-31 MED ORDER — FENTANYL CITRATE (PF) 100 MCG/2ML IJ SOLN
INTRAMUSCULAR | Status: AC | PRN
  Administered 2021-12-31: 50 ug via INTRAVENOUS

## 2021-12-31 MED ORDER — FENTANYL CITRATE (PF) 100 MCG/2ML IJ SOLN
INTRAMUSCULAR | Status: AC
  Filled 2021-12-31: qty 2

## 2021-12-31 MED ORDER — SODIUM CHLORIDE 0.9 % IV SOLN: INTRAVENOUS | Status: DC

## 2021-12-31 MED ORDER — HEPARIN SOD (PORK) LOCK FLUSH 100 UNIT/ML IV SOLN
INTRAVENOUS | Status: AC
  Administered 2021-12-31: 5 [IU]
  Filled 2021-12-31: qty 5

## 2021-12-31 MED ORDER — HEPARIN SOD (PORK) LOCK FLUSH 100 UNIT/ML IV SOLN
INTRAVENOUS | Status: AC | PRN
  Administered 2021-12-31: 500 [IU] via INTRAVENOUS

## 2021-12-31 MED ORDER — LIDOCAINE-EPINEPHRINE 1 %-1:100000 IJ SOLN
INTRAMUSCULAR | Status: AC | PRN
  Administered 2021-12-31: 10 mL via INTRADERMAL

## 2021-12-31 NOTE — Procedures (Signed)
Interventional Radiology Procedure Note  Procedure: Port placement.  Indication: Pancreatic Ca  Findings: Please refer to procedural dictation for full description.  Complications: None  EBL: < 10 mL  Melanie Sliger, MD [phone]   

## 2021-12-31 NOTE — H&P (Signed)
? ? ?Referring Physician(s): ?Ennever,Peter R ? ?Supervising Physician: Mir, Sharen Heck ? ?Patient Status:  [facility] ? ?Chief Complaint: ? ?"I'm getting another port a cath" ? ?Subjective: ?Patient known to IR service from port a cath placement in February 2021 followed by removal in October 2021.  She has a history of recurrent pancreatic cancer and presents again today for new Port-A-Cath placement.  She currently denies fever, headache, chest pain, cough, vomiting or bleeding.  She does have some dyspnea with exertion, epigastric/back pain as well as intermittent nausea.  Additional medical history as below. ? ?Past Medical History:  ?Diagnosis Date  ? Cancer [facility])   ? Cirrhosis ([place])   ? Diabetes mellitus 2012  ? Diverticulosis   ? Gallbladder sludge   ? Hepatitis 2010  ? history of Hepatitis C  ? Hepatitis C   ? Hypertension 2011  ? Obesity   ? ?Past Surgical History:  ?Procedure Laterality Date  ? BILIARY BRUSHING  09/22/2019  ? Procedure: BILIARY BRUSHING;  Surgeon: Rush Landmark Telford Nab., MD;  Location: [facility];  Service: Gastroenterology;;  ? BILIARY DILATION  09/22/2019  ? Procedure: BILIARY DILATION;  Surgeon: Rush Landmark Telford Nab., MD;  Location: [facility];  Service: Gastroenterology;;  ? BILIARY STENT PLACEMENT N/A 09/22/2019  ? Procedure: BILIARY STENT PLACEMENT;  Surgeon: Rush Landmark Telford Nab., MD;  Location: [facility];  Service: Gastroenterology;  Laterality: N/A;  ? COLONOSCOPY  07/24/2011  ? Procedure: COLONOSCOPY;  Surgeon: Landry Dyke, MD;  Location: [facility];  Service: Endoscopy;  Laterality: N/A;  ? Dental Surgery Tooth Extraction    ? ERCP N/A 09/22/2019  ? Procedure: ENDOSCOPIC RETROGRADE CHOLANGIOPANCREATOGRAPHY (ERCP);  Surgeon: Irving Copas., MD;  Location: [facility];  Service: Gastroenterology;  Laterality: N/A;  ? ESOPHAGOGASTRODUODENOSCOPY (EGD) WITH PROPOFOL N/A 09/22/2019  ? Procedure: ESOPHAGOGASTRODUODENOSCOPY (EGD) WITH PROPOFOL;  Surgeon: Rush Landmark Telford Nab., MD;  Location: [facility];  Service: Gastroenterology;  Laterality: N/A;  ? ESOPHAGOGASTRODUODENOSCOPY (EGD) WITH PROPOFOL N/A 09/25/2019  ? Procedure: ESOPHAGOGASTRODUODENOSCOPY (EGD) WITH PROPOFOL;  Surgeon: Rush Landmark Telford Nab., MD;  Location: [facility];  Service: Gastroenterology;  Laterality: N/A;  ? ESOPHAGOGASTRODUODENOSCOPY (EGD) WITH PROPOFOL N/A 01/19/2020  ? Procedure: ESOPHAGOGASTRODUODENOSCOPY (EGD) WITH PROPOFOL;  Surgeon: Rush Landmark Telford Nab., MD;  Location: [facility];  Service: Gastroenterology;  Laterality: N/A;  ? EUS N/A 09/22/2019  ? Procedure: UPPER ENDOSCOPIC ULTRASOUND (EUS) RADIAL;  Surgeon: Rush Landmark Telford Nab., MD;  [facility];  Service: Gastroenterology;  Laterality: N/A;  ? EUS N/A 01/19/2020  ? Procedure: UPPER ENDOSCOPIC ULTRASOUND (EUS) RADIAL;  Surgeon: Rush Landmark Telford Nab., MD;  Location: [place];  Service: Gastroenterology;  Laterality: N/A;  ? FIDUCIAL MARKER PLACEMENT N/A 01/19/2020  ? Procedure: FIDUCIAL MARKER PLACEMENT;  Surgeon: Irving Copas., MD;  Location: [place];  Service: Gastroenterology;  Laterality: N/A;  ? FINE NEEDLE ASPIRATION  09/22/2019  ? Procedure: FINE NEEDLE ASPIRATION (FNA) LINEAR;  Surgeon: Irving Copas., MD;  [facility];  Service: Gastroenterology;;  ? FINE NEEDLE ASPIRATION  09/25/2019  ? Procedure: FINE NEEDLE ASPIRATION (FNA) RADIAL;  Surgeon: Irving Copas., MD;  Location: [facility];  Service: Gastroenterology;;  ? HEMOSTASIS CLIP PLACEMENT  09/25/2019  ? Procedure: HEMOSTASIS CLIP PLACEMENT;  Surgeon: Irving Copas., MD;  Location: [facility];  Service: Gastroenterology;;  ? IR IMAGING GUIDED PORT INSERTION  09/24/2019  ? IR REMOVAL TUN ACCESS W/ PORT W/O FL MOD SED  06/16/2020  ? SPHINCTEROTOMY  09/22/2019  ? Procedure: SPHINCTEROTOMY;  Surgeon: Irving Copas., MD;  Location[facility];  Service: Gastroenterology;;  ? UPPER ESOPHAGEAL ENDOSCOPIC ULTRASOUND  (EUS) N/A 09/25/2019  ? Procedure: UPPER ESOPHAGEAL ENDOSCOPIC ULTRASOUND (EUS);  Surgeon: Irving Copas., MD;  Location: [facility];  Service: Gastroenterology;  Laterality: N/A;  ? ? ? ? ?Allergies: ?Lisinopril ? ?Medications: ?Prior to Admission medications   ?Medication Sig Start Date End Date Taking? Authorizing Provider  ?amLODipine (NORVASC) 10 MG tablet Take 1 tablet (10 mg total) by mouth at bedtime. 11/27/21  Yes Nche, Charlene Brooke, NP  ?APIXABAN Arne [place]) VTE STARTER PACK ('10MG'$  AND '5MG'$ ) Take as directed on package: start with two-'5mg'$  tablets twice daily for 7 days. On day 8, switch to one-'5mg'$  tablet twice daily. 12/24/21  Yes Volanda Napoleon, MD  ?atorvastatin (LIPITOR) 20 MG tablet Take 1 tablet (20 mg total) by mouth at bedtime. 01/25/21  Yes Nche, Charlene Brooke, NP  ?CREON 36000-114000 units CPEP capsule TAKE 2 CAPSULES BY MOUTH THREE TIMES DAILY BEFORE MEAL(S) 11/19/21  Yes Ennever, Rudell Cobb, MD  ?glipiZIDE (GLUCOTROL XL) 5 MG 24 hr tablet Take 1 tablet (5 mg total) by mouth daily with breakfast. 09/14/21  Yes Nche, Charlene Brooke, NP  ?HYDROmorphone (DILAUDID) 2 MG tablet Take 1 tablet (2 mg total) by mouth every 6 (six) hours as needed for severe pain. 12/24/21  Yes Volanda Napoleon, MD  ?metFORMIN (GLUCOPHAGE XR) 750 MG 24 hr tablet Take 1 tablet (750 mg total) by mouth daily with breakfast. 09/14/21  Yes Nche, Charlene Brooke, NP  ?metoprolol succinate (TOPROL-XL) 25 MG 24 hr tablet TAKE 1 TABLET BY MOUTH ONCE DAILY 01/25/21  Yes Nche, Charlene Brooke, NP  ?olmesartan (BENICAR) 5 MG tablet Take 1 tablet (5 mg total) by mouth daily. 01/25/21  Yes Nche, Charlene Brooke, NP  ?ondansetron (ZOFRAN) 8 MG tablet Take 1 tablet (8 mg total) by mouth 2 (two) times daily as needed (Nausea or vomiting). 12/26/21  Yes Volanda Napoleon, MD  ?pantoprazole (PROTONIX) 40 MG tablet Take 1 tablet (40 mg total) by mouth daily. 12/24/21  Yes Volanda Napoleon, MD  ?prochlorperazine (COMPAZINE) 10 MG tablet Take 1 tablet (10 mg total)  by mouth every 6 (six) hours as needed (Nausea or vomiting). 12/26/21  Yes Ennever, Rudell Cobb, MD  ?calcium-vitamin D (OSCAL WITH D) 500-200 MG-UNIT tablet Take 1 tablet by mouth.    [provider]  ?fluticasone (FLONASE) 50 MCG/ACT nasal spray Place 2 sprays into both nostrils daily. ?Patient not taking: Reported on 12/24/2021 05/14/21   Tower, Roque Lias A, MD  ?glucose blood test strip Test blood glucose once daily. One Touch Verio test strips ?Patient not taking: Reported on 12/24/2021 09/28/19   Flossie Buffy, NP  ?lidocaine-prilocaine (EMLA) cream Apply to affected area once 12/26/21   Volanda Napoleon, MD  ?pantoprazole (PROTONIX) 40 MG tablet Take 1 tablet (40 mg total) by mouth 2 (two) times daily for 14 days. 12/04/21 12/18/21  Nche, Charlene Brooke, NP  ?potassium chloride (KLOR-CON M) 20 MEQ tablet Take 1 tablet (20 mEq total) by mouth 2 (two) times daily. ?Patient not taking: Reported on 12/24/2021 11/27/21   Nche, Charlene Brooke, NP  ? ? ? ?Vital Signs: ?BP 130/76   Pulse 96   Temp 98.9 ?F (37.2 ?C) (Oral)   Resp 18   SpO2 97%  ? ?Physical Exam awake, alert.  Chest clear to auscultation bilaterally.  Heart with regular rate and rhythm.  Abdomen soft, positive bowel sounds, moderate epigastric tenderness to palpation.  No lower extremity edema. ? ?  Imaging: ?No results found. ? ?Labs: ? ?CBC: ?Recent Labs  ?  05/14/21 ?1114 11/27/21 ?0903 12/24/21 ?0816  ?WBC 7.7 7.6 21.0*  ?HGB 12.7 12.3 11.0*  ?HCT 36.7 37.1 32.0*  ?PLT 324.0 310.0 411*  ? ? ?COAGS: ?No results for input(s): INR, APTT in the last 8760 hours. ? ?BMP: ?Recent Labs  ?  09/04/21 ?0836 11/27/21 ?0903 12/04/21 ?1142 12/24/21 ?0816  ?NA 135 132* 132* 131*  ?K 3.1* 2.8* 4.0 3.6  ?CL 97 94* 96 93*  ?CO2 '29 28 28 29  '$ ?GLUCOSE 221* 270* 150* 114*  ?BUN '12 10 7 '$ 7*  ?CALCIUM 9.2 9.5 9.5 9.6  ?CREATININE 0.71 0.66 0.58 0.79  ?GFRNONAA  --   --   --  >60  ? ? ?LIVER FUNCTION TESTS: ?Recent Labs  ?  11/27/21 ?0903 12/04/21 ?1142 12/24/21 ?0816  ?BILITOT  0.4  --  0.5  ?AST 15  --  13*  ?ALT 11  --  10  ?ALKPHOS 56  --  65  ?PROT 7.6  --  7.7  ?ALBUMIN 3.9 3.8 3.7  ? ? ?Assessment and Plan: ?Patient known to IR service from port a cath placement in February 2

## 2022-01-02 ENCOUNTER — Inpatient Hospital Stay: Payer: BC Managed Care – PPO

## 2022-01-02 ENCOUNTER — Other Ambulatory Visit: Payer: Self-pay | Admitting: *Deleted

## 2022-01-02 VITALS — BP 111/83 | HR 88 | Temp 98.6°F | Resp 21 | Wt 187.0 lb

## 2022-01-02 DIAGNOSIS — C250 Malignant neoplasm of head of pancreas: Secondary | ICD-10-CM

## 2022-01-02 DIAGNOSIS — C259 Malignant neoplasm of pancreas, unspecified: Secondary | ICD-10-CM | POA: Diagnosis not present

## 2022-01-02 LAB — CBC WITH DIFFERENTIAL (CANCER CENTER ONLY)
Abs Immature Granulocytes: 0.29 10*3/uL — ABNORMAL HIGH (ref 0.00–0.07)
Basophils Absolute: 0.1 10*3/uL (ref 0.0–0.1)
Basophils Relative: 0 %
Eosinophils Absolute: 0 10*3/uL (ref 0.0–0.5)
Eosinophils Relative: 0 %
HCT: 28.1 % — ABNORMAL LOW (ref 36.0–46.0)
Hemoglobin: 9.2 g/dL — ABNORMAL LOW (ref 12.0–15.0)
Immature Granulocytes: 1 %
Lymphocytes Relative: 3 %
Lymphs Abs: 0.9 10*3/uL (ref 0.7–4.0)
MCH: 26.8 pg (ref 26.0–34.0)
MCHC: 32.7 g/dL (ref 30.0–36.0)
MCV: 81.9 fL (ref 80.0–100.0)
Monocytes Absolute: 1.3 10*3/uL — ABNORMAL HIGH (ref 0.1–1.0)
Monocytes Relative: 4 %
Neutro Abs: 25.7 10*3/uL — ABNORMAL HIGH (ref 1.7–7.7)
Neutrophils Relative %: 92 %
Platelet Count: 489 10*3/uL — ABNORMAL HIGH (ref 150–400)
RBC: 3.43 MIL/uL — ABNORMAL LOW (ref 3.87–5.11)
RDW: 13.7 % (ref 11.5–15.5)
WBC Count: 28.2 10*3/uL — ABNORMAL HIGH (ref 4.0–10.5)
nRBC: 0 % (ref 0.0–0.2)

## 2022-01-02 LAB — CMP (CANCER CENTER ONLY)
ALT: 35 U/L (ref 0–44)
AST: 51 U/L — ABNORMAL HIGH (ref 15–41)
Albumin: 3 g/dL — ABNORMAL LOW (ref 3.5–5.0)
Alkaline Phosphatase: 92 U/L (ref 38–126)
Anion gap: 7 (ref 5–15)
BUN: 8 mg/dL (ref 8–23)
CO2: 28 mmol/L (ref 22–32)
Calcium: 9 mg/dL (ref 8.9–10.3)
Chloride: 90 mmol/L — ABNORMAL LOW (ref 98–111)
Creatinine: 0.55 mg/dL (ref 0.44–1.00)
GFR, Estimated: >60 mL/min (ref >60)
Glucose, Bld: 136 mg/dL — ABNORMAL HIGH (ref 70–99)
Potassium: 3.9 mmol/L (ref 3.5–5.1)
Sodium: 125 mmol/L — ABNORMAL LOW (ref 135–145)
Total Bilirubin: 0.6 mg/dL (ref 0.3–1.2)
Total Protein: 6.9 g/dL (ref 6.5–8.1)

## 2022-01-02 MED ORDER — FENTANYL 25 MCG/HR TD PT72: 1.0000 | MEDICATED_PATCH | TRANSDERMAL | 0 refills | Status: DC

## 2022-01-02 MED ORDER — PACLITAXEL PROTEIN-BOUND CHEMO INJECTION 100 MG
125.0000 mg/m2 | Freq: Once | INTRAVENOUS | Status: AC
  Administered 2022-01-02: 250 mg via INTRAVENOUS
  Filled 2022-01-02: qty 50

## 2022-01-02 MED ORDER — SODIUM CHLORIDE 0.9 % IV SOLN: INTRAVENOUS | Status: DC

## 2022-01-02 MED ORDER — SODIUM CHLORIDE 0.9 % IV SOLN
1000.0000 mg/m2 | Freq: Once | INTRAVENOUS | Status: AC
  Administered 2022-01-02: 1938 mg via INTRAVENOUS
  Filled 2022-01-02: qty 50.97

## 2022-01-02 MED ORDER — PROCHLORPERAZINE MALEATE 10 MG PO TABS
10.0000 mg | ORAL_TABLET | Freq: Once | ORAL | Status: AC
  Administered 2022-01-02: 10 mg via ORAL
  Filled 2022-01-02: qty 1

## 2022-01-02 MED ORDER — HYDROMORPHONE HCL 4 MG PO TABS: 4.0000 mg | ORAL_TABLET | ORAL | 0 refills | Status: DC | PRN

## 2022-01-02 MED ORDER — SODIUM CHLORIDE 0.9% FLUSH
10.0000 mL | INTRAVENOUS | Status: DC | PRN
  Administered 2022-01-02: 10 mL

## 2022-01-02 MED ORDER — HEPARIN SOD (PORK) LOCK FLUSH 100 UNIT/ML IV SOLN
500.0000 [IU] | Freq: Once | INTRAVENOUS | Status: AC | PRN
  Administered 2022-01-02: 500 [IU]

## 2022-01-02 MED ORDER — SODIUM CHLORIDE 0.9 % IV SOLN: Freq: Once | INTRAVENOUS | Status: DC

## 2022-01-02 NOTE — Patient Instructions (Signed)
[facility]  Discharge Instructions: ?Thank you for choosing [facility] to provide your oncology and hematology care.  ? ?If you have a lab appointment with the [facility], please go directly to the [facility] and check in at the registration area. ? ?Wear comfortable clothing and clothing appropriate for easy access to any Portacath or PICC line.  ? ?We strive to give you quality time with your provider. You may need to reschedule your appointment if you arrive late (15 or more minutes).  Arriving late affects you and other patients whose appointments are after yours.  Also, if you miss three or more appointments without notifying the office, you may be dismissed from the clinic at the provider?s discretion.    ?  ?For prescription refill requests, have your pharmacy contact our office and allow 72 hours for refills to be completed.   ? ?Today you received the following chemotherapy and/or immunotherapy agents gemzar , abraxane    ?  ?To help prevent nausea and vomiting after your treatment, we encourage you to take your nausea medication as directed. ? ?BELOW ARE SYMPTOMS THAT SHOULD BE REPORTED IMMEDIATELY: ?*FEVER GREATER THAN 100.4 F (38 ?C) OR HIGHER ?*CHILLS OR SWEATING ?*NAUSEA AND VOMITING THAT IS NOT CONTROLLED WITH YOUR NAUSEA MEDICATION ?*UNUSUAL SHORTNESS OF BREATH ?*UNUSUAL BRUISING OR BLEEDING ?*URINARY PROBLEMS (pain or burning when urinating, or frequent urination) ?*BOWEL PROBLEMS (unusual diarrhea, constipation, pain near the anus) ?TENDERNESS IN MOUTH AND THROAT WITH OR WITHOUT PRESENCE OF ULCERS (sore throat, sores in mouth, or a toothache) ?UNUSUAL RASH, SWELLING OR PAIN  ?UNUSUAL VAGINAL DISCHARGE OR ITCHING  ? ?Items with * indicate a potential emergency and should be followed up as soon as possible or go to the Emergency Department if any problems should occur. ? ?Please show the CHEMOTHERAPY ALERT CARD or IMMUNOTHERAPY ALERT CARD at check-in  to the Emergency Department and triage nurse. ?Should you have questions after your visit or need to cancel or reschedule your appointment, please contact [facility]  [phone] and follow the prompts.  Office hours are 8:00 a.m. to 4:30 p.m. Monday - Friday. Please note that voicemails left after 4:00 p.m. may not be returned until the following business day.  We are closed weekends and major holidays. You have access to a nurse at all times for urgent questions. Please call the main number to the clinic [phone] and follow the prompts. ? ?For any non-urgent questions, you may also contact your provider using MyChart. We now offer e-Visits for anyone 63 and older to request care online for non-urgent symptoms. For details visit [email or web address]. ?  ?Also download the MyChart app! Go to the app store, search "MyChart", open the app, select [facility], and log in with your MyChart username and password. ? ?Due to Covid, a mask is required upon entering the hospital/clinic. If you do not have a mask, one will be given to you upon arrival. For doctor visits, patients may have 1 support person aged 31 or older with them. For treatment visits, patients cannot have anyone with them due to current Covid guidelines and our immunocompromised population.  ?

## 2022-01-02 NOTE — Patient Instructions (Signed)

## 2022-01-02 NOTE — Addendum Note (Signed)
Addended by: Burney Gauze R on: 01/02/2022 11:40 AM ? ? Modules accepted: Orders ? ?

## 2022-01-04 ENCOUNTER — Encounter (HOSPITAL_COMMUNITY): Payer: Self-pay | Admitting: Surgery

## 2022-01-04 NOTE — Progress Notes (Signed)
PA submitted to [employer] on 01/02/22 for Hydromorphone 4 mg tablets and Fentanyl 25 mcg/hr patches.  PA approved for both medications from 01/02/22 through 01/03/23.

## 2022-01-09 ENCOUNTER — Inpatient Hospital Stay: Payer: BC Managed Care – PPO

## 2022-01-09 ENCOUNTER — Other Ambulatory Visit: Payer: Self-pay | Admitting: *Deleted

## 2022-01-09 VITALS — BP 137/70 | HR 75 | Temp 97.9°F | Resp 18

## 2022-01-09 DIAGNOSIS — C250 Malignant neoplasm of head of pancreas: Secondary | ICD-10-CM

## 2022-01-09 DIAGNOSIS — D649 Anemia, unspecified: Secondary | ICD-10-CM

## 2022-01-09 DIAGNOSIS — C259 Malignant neoplasm of pancreas, unspecified: Secondary | ICD-10-CM | POA: Diagnosis not present

## 2022-01-09 LAB — CBC WITH DIFFERENTIAL (CANCER CENTER ONLY)
Abs Immature Granulocytes: 0.11 10*3/uL — ABNORMAL HIGH (ref 0.00–0.07)
Basophils Absolute: 0.1 10*3/uL (ref 0.0–0.1)
Basophils Relative: 1 %
Eosinophils Absolute: 0 10*3/uL (ref 0.0–0.5)
Eosinophils Relative: 0 %
HCT: 22.7 % — ABNORMAL LOW (ref 36.0–46.0)
Hemoglobin: 7.4 g/dL — ABNORMAL LOW (ref 12.0–15.0)
Immature Granulocytes: 1 %
Lymphocytes Relative: 7 %
Lymphs Abs: 0.5 10*3/uL — ABNORMAL LOW (ref 0.7–4.0)
MCH: 26.6 pg (ref 26.0–34.0)
MCHC: 32.6 g/dL (ref 30.0–36.0)
MCV: 81.7 fL (ref 80.0–100.0)
Monocytes Absolute: 0.6 10*3/uL (ref 0.1–1.0)
Monocytes Relative: 8 %
Neutro Abs: 6.3 10*3/uL (ref 1.7–7.7)
Neutrophils Relative %: 83 %
Platelet Count: 298 10*3/uL (ref 150–400)
RBC: 2.78 MIL/uL — ABNORMAL LOW (ref 3.87–5.11)
RDW: 13.9 % (ref 11.5–15.5)
WBC Count: 7.6 10*3/uL (ref 4.0–10.5)
nRBC: 0.4 % — ABNORMAL HIGH (ref 0.0–0.2)

## 2022-01-09 LAB — CMP (CANCER CENTER ONLY)
ALT: 43 U/L (ref 0–44)
AST: 46 U/L — ABNORMAL HIGH (ref 15–41)
Albumin: 2.8 g/dL — ABNORMAL LOW (ref 3.5–5.0)
Alkaline Phosphatase: 128 U/L — ABNORMAL HIGH (ref 38–126)
Anion gap: 8 (ref 5–15)
BUN: 12 mg/dL (ref 8–23)
CO2: 28 mmol/L (ref 22–32)
Calcium: 8.7 mg/dL — ABNORMAL LOW (ref 8.9–10.3)
Chloride: 87 mmol/L — ABNORMAL LOW (ref 98–111)
Creatinine: 0.68 mg/dL (ref 0.44–1.00)
GFR, Estimated: >60 mL/min (ref >60)
Glucose, Bld: 208 mg/dL — ABNORMAL HIGH (ref 70–99)
Potassium: 4.8 mmol/L (ref 3.5–5.1)
Sodium: 123 mmol/L — ABNORMAL LOW (ref 135–145)
Total Bilirubin: 0.8 mg/dL (ref 0.3–1.2)
Total Protein: 6.7 g/dL (ref 6.5–8.1)

## 2022-01-09 LAB — ABO/RH: ABO/RH(D): A POS

## 2022-01-09 LAB — PREPARE RBC (CROSSMATCH)

## 2022-01-09 MED ORDER — SODIUM CHLORIDE 0.9 % IV SOLN
1000.0000 mL | Freq: Once | INTRAVENOUS | Status: AC
  Administered 2022-01-09: 1000 mL via INTRAVENOUS

## 2022-01-09 MED ORDER — HEPARIN SOD (PORK) LOCK FLUSH 100 UNIT/ML IV SOLN: 250.0000 [IU] | INTRAVENOUS | Status: DC | PRN

## 2022-01-09 MED ORDER — HYDROMORPHONE HCL 1 MG/ML IJ SOLN
2.0000 mg | Freq: Once | INTRAMUSCULAR | Status: AC
  Administered 2022-01-09: 2 mg via INTRAVENOUS
  Filled 2022-01-09: qty 2

## 2022-01-09 MED ORDER — SODIUM CHLORIDE 0.9% IV SOLUTION
250.0000 mL | Freq: Once | INTRAVENOUS | Status: AC
  Administered 2022-01-09: 250 mL via INTRAVENOUS

## 2022-01-09 MED ORDER — HYDROMORPHONE HCL 1 MG/ML IJ SOLN
1.0000 mg | Freq: Once | INTRAMUSCULAR | Status: AC
  Administered 2022-01-09: 1 mg via INTRAVENOUS
  Filled 2022-01-09: qty 1

## 2022-01-09 MED ORDER — ACETAMINOPHEN 325 MG PO TABS
650.0000 mg | ORAL_TABLET | Freq: Once | ORAL | Status: AC
  Administered 2022-01-09: 650 mg via ORAL
  Filled 2022-01-09: qty 2

## 2022-01-09 MED ORDER — SODIUM CHLORIDE 0.9% FLUSH: 3.0000 mL | INTRAVENOUS | Status: DC | PRN

## 2022-01-09 MED ORDER — HYDROMORPHONE HCL 1 MG/ML IJ SOLN
1.0000 mg | Freq: Once | INTRAMUSCULAR | Status: AC | PRN
  Administered 2022-01-09: 1 mg via INTRAVENOUS
  Filled 2022-01-09: qty 1

## 2022-01-09 MED ORDER — SODIUM CHLORIDE 0.9% FLUSH
10.0000 mL | INTRAVENOUS | Status: AC | PRN
  Administered 2022-01-09: 10 mL

## 2022-01-09 MED ORDER — HEPARIN SOD (PORK) LOCK FLUSH 100 UNIT/ML IV SOLN
500.0000 [IU] | Freq: Every day | INTRAVENOUS | Status: AC | PRN
  Administered 2022-01-09: 500 [IU]

## 2022-01-09 NOTE — Progress Notes (Signed)
No treatment . T&C 2 units RBC transfusion today

## 2022-01-09 NOTE — Patient Instructions (Signed)
Blood Transfusion, Adult A blood transfusion is a procedure in which you receive blood through an IV tube. You may need this procedure because of: A bleeding disorder. An illness. An injury. A surgery. The blood may come from someone else (a donor). You may also be able to donate blood for yourself. The blood given in a transfusion is made up of different types of cells. You may get: Red blood cells. These carry oxygen to the cells in the body. White blood cells. These help you fight infections. Platelets. These help your blood to clot. Plasma. This is the liquid part of your blood. It carries proteins and other substances through the body. If you have a clotting disorder, you may also get other types of blood products. Tell your doctor about: Any blood disorders you have. Any reactions you have had during a blood transfusion in the past. Any allergies you have. All medicines you are taking, including vitamins, herbs, eye drops, creams, and over-the-counter medicines. Any surgeries you have had. Any medical conditions you have. This includes any recent fever or cold symptoms. Whether you are pregnant or may be pregnant. What are the risks? Generally, this is a safe procedure. However, problems may occur. The most common problems include: A mild allergic reaction. This includes red, swollen areas of skin (hives) and itching. Fever or chills. This may be the body's response to new blood cells received. This may happen during or up to 4 hours after the transfusion. More serious problems may include: Too much fluid in the lungs. This may cause breathing problems. A serious allergic reaction. This includes breathing trouble or swelling around the face and lips. Lung injury. This causes breathing trouble and low oxygen in the blood. This can happen within hours of the transfusion or days later. Too much iron. This can happen after getting many blood transfusions over a period of time. An  infection or virus passed through the blood. This is rare. Donated blood is carefully tested before it is given. Your body's defense system (immune system) trying to attack the new blood cells. This is rare. Symptoms may include fever, chills, nausea, low blood pressure, and low back or chest pain. Donated cells attacking healthy tissues. This is rare. What happens before the procedure? Medicines Ask your doctor about: Changing or stopping your normal medicines. This is important. Taking aspirin and ibuprofen. Do not take these medicines unless your doctor tells you to take them. Taking over-the-counter medicines, vitamins, herbs, and supplements. General instructions Follow instructions from your doctor about what you cannot eat or drink. You will have a blood test to find out your blood type. The test also finds out what type of blood your body will accept and matches it to the donor type. If you are going to have a planned surgery, you may be able to donate your own blood. This may be done in case you need a transfusion. You will have your temperature, blood pressure, and pulse checked. You may receive medicine to help prevent an allergic reaction. This may be done if you have had a reaction to a transfusion before. This medicine may be given to you by mouth or through an IV tube. This procedure lasts about 1-4 hours. Plan for the time you need. What happens during the procedure?  An IV tube will be put into one of your veins. The bag of donated blood will be attached to your IV tube. Then, the blood will enter through your vein. Your temperature,   blood pressure, and pulse will be checked often. This is done to find early signs of a transfusion reaction. Tell your nurse right away if you have any of these symptoms: Shortness of breath or trouble breathing. Chest or back pain. Fever or chills. Red, swollen areas of skin or itching. If you have any signs or symptoms of a reaction, your  transfusion will be stopped. You may also be given medicine. When the transfusion is finished, your IV tube will be taken out. Pressure may be put on the IV site for a few minutes. A bandage (dressing) will be put on the IV site. The procedure may vary among doctors and hospitals. What happens after the procedure? You will be monitored until you leave the hospital or clinic. This includes checking your temperature, blood pressure, pulse, breathing rate, and blood oxygen level. Your blood may be tested to see how you are responding to the transfusion. You may be warmed with fluids or blankets. This is done to keep the temperature of your body normal. If you have your procedure in an outpatient setting, you will be told whom to contact to report any reactions. Where to find more information To learn more, visit the [employer]: redcross.org Summary A blood transfusion is a procedure in which you are given blood through an IV tube. The blood may come from someone else (a donor). You may also be able to donate blood for yourself. The blood you are given is made up of different blood cells. You may receive red blood cells, platelets, plasma, or white blood cells. Your temperature, blood pressure, and pulse will be checked often. After the procedure, your blood may be tested to see how you are responding. This information is not intended to replace advice given to you by your health care provider. Make sure you discuss any questions you have with your health care provider. Document Revised: 01/28/2019 Document Reviewed: 01/28/2019 Elsevier Patient Education  2023 [employer] Inc.  

## 2022-01-10 ENCOUNTER — Other Ambulatory Visit: Payer: Self-pay | Admitting: *Deleted

## 2022-01-10 ENCOUNTER — Inpatient Hospital Stay: Payer: BC Managed Care – PPO

## 2022-01-10 ENCOUNTER — Encounter: Payer: Self-pay | Admitting: *Deleted

## 2022-01-10 DIAGNOSIS — D649 Anemia, unspecified: Secondary | ICD-10-CM

## 2022-01-10 DIAGNOSIS — C259 Malignant neoplasm of pancreas, unspecified: Secondary | ICD-10-CM | POA: Diagnosis not present

## 2022-01-10 DIAGNOSIS — C250 Malignant neoplasm of head of pancreas: Secondary | ICD-10-CM

## 2022-01-10 LAB — PREPARE RBC (CROSSMATCH)

## 2022-01-10 MED ORDER — LORAZEPAM 2 MG/ML IJ SOLN
0.5000 mg | Freq: Once | INTRAMUSCULAR | Status: AC
  Administered 2022-01-10: 0.5 mg via INTRAVENOUS
  Filled 2022-01-10: qty 1

## 2022-01-10 MED ORDER — SODIUM CHLORIDE 0.9% FLUSH
10.0000 mL | INTRAVENOUS | Status: AC | PRN
  Administered 2022-01-10: 10 mL

## 2022-01-10 MED ORDER — ACETAMINOPHEN 325 MG PO TABS
650.0000 mg | ORAL_TABLET | Freq: Once | ORAL | Status: AC
  Administered 2022-01-10: 650 mg via ORAL
  Filled 2022-01-10: qty 2

## 2022-01-10 MED ORDER — HEPARIN SOD (PORK) LOCK FLUSH 100 UNIT/ML IV SOLN
500.0000 [IU] | Freq: Every day | INTRAVENOUS | Status: AC | PRN
  Administered 2022-01-10: 500 [IU]

## 2022-01-10 MED ORDER — SODIUM CHLORIDE 0.9% IV SOLUTION
250.0000 mL | Freq: Once | INTRAVENOUS | Status: AC
  Administered 2022-01-10: 250 mL via INTRAVENOUS

## 2022-01-10 NOTE — Progress Notes (Signed)
Blood orders entered. 

## 2022-01-10 NOTE — Progress Notes (Signed)
Patient port incision site noted to be opened. No drainage, redness, swelling or pain noted at the site. Port flushes easily with positive blood return. Dr. Marin Olp notified. Ok to transfuse blood today through port. IR referral made for evaluation of site. Patient verbalized understanding.   0954 Patient complains of difficulty "catching her breath" and increase in indigestion. Blood stopped, normal saline wide open, Dr. Marin Olp notified. Order given and carried out for Ativan 0.5 mg IV, restart blood 30 minutes after ativan given.  1032 Vital signs obtained, patient reports feeling better. Blood transfusion restarted 120 ml/hr x 15 min

## 2022-01-10 NOTE — Patient Instructions (Signed)
Blood Transfusion, Adult A blood transfusion is a procedure in which you receive blood or a type of blood cell (blood component) through an IV. You may need a blood transfusion when your blood level is low. This may result from a bleeding disorder, illness, injury, or surgery. The blood may come from a donor. You may also be able to donate blood for yourself (autologous blood donation) before a planned surgery. The blood given in a transfusion is made up of different blood components. You may receive: Red blood cells. These carry oxygen to the cells in the body. Platelets. These help your blood to clot. Plasma. This is the liquid part of your blood. It carries proteins and other substances throughout the body. White blood cells. These help you fight infections. If you have hemophilia or another clotting disorder, you may also receive other types of blood products. Tell a health care provider about: Any blood disorders you have. Any previous reactions you have had during a blood transfusion. Any allergies you have. All medicines you are taking, including vitamins, herbs, eye drops, creams, and over-the-counter medicines. Any surgeries you have had. Any medical conditions you have, including any recent fever or cold symptoms. Whether you are pregnant or may be pregnant. What are the risks? Generally, this is a safe procedure. However, problems may occur. The most common problems include: A mild allergic reaction, such as red, swollen areas of skin (hives) and itching. Fever or chills. This may be the body's response to new blood cells received. This may occur during or up to 4 hours after the transfusion. More serious problems may include: Transfusion-associated circulatory overload (TACO), or too much fluid in the lungs. This may cause breathing problems. A serious allergic reaction, such as difficulty breathing or swelling around the face and lips. Transfusion-related acute lung injury  (TRALI), which causes breathing difficulty and low oxygen in the blood. This can occur within hours of the transfusion or several days later. Iron overload. This can happen after receiving many blood transfusions over a period of time. Infection or virus being transmitted. This is rare because donated blood is carefully tested before it is given. Hemolytic transfusion reaction. This is rare. It happens when your body's defense system (immune system)tries to attack the new blood cells. Symptoms may include fever, chills, nausea, low blood pressure, and low back or chest pain. Transfusion-associated graft-versus-host disease (TAGVHD). This is rare. It happens when donated cells attack your body's healthy tissues. What happens before the procedure? Medicines Ask your health care provider about: Changing or stopping your regular medicines. This is especially important if you are taking diabetes medicines or blood thinners. Taking medicines such as aspirin and ibuprofen. These medicines can thin your blood. Do not take these medicines unless your health care provider tells you to take them. Taking over-the-counter medicines, vitamins, herbs, and supplements. General instructions Follow instructions from your health care provider about eating and drinking restrictions. You will have a blood test to determine your blood type. This is necessary to know what kind of blood your body will accept and to match it to the donor blood. If you are going to have a planned surgery, you may be able to do an autologous blood donation. This may be done in case you need to have a transfusion. You will have your temperature, blood pressure, and pulse monitored before the transfusion. If you have had an allergic reaction to a transfusion in the past, you may be given medicine to help prevent   a reaction. This medicine may be given to you by mouth (orally) or through an IV. Set aside time for the blood transfusion. This  procedure generally takes 1-4 hours to complete. What happens during the procedure?  An IV will be inserted into one of your veins. The bag of donated blood will be attached to your IV. The blood will then enter through your vein. Your temperature, blood pressure, and pulse will be monitored regularly during the transfusion. This monitoring is done to detect early signs of a transfusion reaction. Tell your nurse right away if you have any of these symptoms during the transfusion: Shortness of breath or trouble breathing. Chest or back pain. Fever or chills. Hives or itching. If you have any signs or symptoms of a reaction, your transfusion will be stopped and you may be given medicine. When the transfusion is complete, your IV will be removed. Pressure may be applied to the IV site for a few minutes. A bandage (dressing)will be applied. The procedure may vary among health care providers and hospitals. What happens after the procedure? Your temperature, blood pressure, pulse, breathing rate, and blood oxygen level will be monitored until you leave the hospital or clinic. Your blood may be tested to see how you are responding to the transfusion. You may be warmed with fluids or blankets to maintain a normal body temperature. If you receive your blood transfusion in an outpatient setting, you will be told whom to contact to report any reactions. Where to find more information For more information on blood transfusions, visit the [employer]: redcross.org Summary A blood transfusion is a procedure in which you receive blood or a type of blood cell (blood component) through an IV. The blood you receive may come from a donor or be donated by yourself (autologous blood donation) before a planned surgery. The blood given in a transfusion is made up of different blood components. You may receive red blood cells, platelets, plasma, or white blood cells depending on the condition treated. Your  temperature, blood pressure, and pulse will be monitored before, during, and after the transfusion. After the transfusion, your blood may be tested to see how your body has responded. This information is not intended to replace advice given to you by your health care provider. Make sure you discuss any questions you have with your health care provider. Document Revised: 06/10/2019 Document Reviewed: 01/28/2019 Elsevier Patient Education  2023 [employer]  

## 2022-01-11 ENCOUNTER — Inpatient Hospital Stay (HOSPITAL_COMMUNITY): Admission: RE | Admit: 2022-01-11 | Payer: BC Managed Care – PPO | Source: Ambulatory Visit

## 2022-01-11 LAB — TYPE AND SCREEN
ABO/RH(D): A POS
Antibody Screen: NEGATIVE
Unit division: 0
Unit division: 0

## 2022-01-11 LAB — BPAM RBC
Blood Product Expiration Date: 202306082359
Blood Product Expiration Date: 202306082359
ISSUE DATE / TIME: 202305241307
ISSUE DATE / TIME: 202305250817
Unit Type and Rh: 6200
Unit Type and Rh: 6200

## 2022-01-15 ENCOUNTER — Other Ambulatory Visit: Payer: Self-pay

## 2022-01-15 ENCOUNTER — Inpatient Hospital Stay (HOSPITAL_COMMUNITY)
Admission: EM | Admit: 2022-01-15 | Discharge: 2022-01-22 | DRG: 441 | Disposition: A | Discharge status: Home / Self Care | Payer: BC Managed Care – PPO | Attending: Internal Medicine | Admitting: Internal Medicine

## 2022-01-15 ENCOUNTER — Emergency Department (HOSPITAL_COMMUNITY): Payer: BC Managed Care – PPO

## 2022-01-15 ENCOUNTER — Telehealth: Payer: Self-pay | Admitting: *Deleted

## 2022-01-15 ENCOUNTER — Encounter (HOSPITAL_COMMUNITY): Payer: Self-pay | Admitting: Emergency Medicine

## 2022-01-15 DIAGNOSIS — I951 Orthostatic hypotension: Secondary | ICD-10-CM | POA: Diagnosis not present

## 2022-01-15 DIAGNOSIS — R0609 Other forms of dyspnea: Secondary | ICD-10-CM | POA: Diagnosis present

## 2022-01-15 DIAGNOSIS — E871 Hypo-osmolality and hyponatremia: Secondary | ICD-10-CM | POA: Diagnosis present

## 2022-01-15 DIAGNOSIS — Z8249 Family history of ischemic heart disease and other diseases of the circulatory system: Secondary | ICD-10-CM

## 2022-01-15 DIAGNOSIS — R0602 Shortness of breath: Principal | ICD-10-CM

## 2022-01-15 DIAGNOSIS — Z79899 Other long term (current) drug therapy: Secondary | ICD-10-CM

## 2022-01-15 DIAGNOSIS — K769 Liver disease, unspecified: Secondary | ICD-10-CM | POA: Diagnosis not present

## 2022-01-15 DIAGNOSIS — K5900 Constipation, unspecified: Secondary | ICD-10-CM | POA: Diagnosis not present

## 2022-01-15 DIAGNOSIS — Z87891 Personal history of nicotine dependence: Secondary | ICD-10-CM

## 2022-01-15 DIAGNOSIS — Z7901 Long term (current) use of anticoagulants: Secondary | ICD-10-CM

## 2022-01-15 DIAGNOSIS — K8689 Other specified diseases of pancreas: Secondary | ICD-10-CM | POA: Diagnosis present

## 2022-01-15 DIAGNOSIS — E878 Other disorders of electrolyte and fluid balance, not elsewhere classified: Secondary | ICD-10-CM | POA: Diagnosis not present

## 2022-01-15 DIAGNOSIS — E1169 Type 2 diabetes mellitus with other specified complication: Secondary | ICD-10-CM | POA: Diagnosis not present

## 2022-01-15 DIAGNOSIS — B1920 Unspecified viral hepatitis C without hepatic coma: Secondary | ICD-10-CM | POA: Diagnosis present

## 2022-01-15 DIAGNOSIS — D75839 Thrombocytosis, unspecified: Secondary | ICD-10-CM | POA: Diagnosis present

## 2022-01-15 DIAGNOSIS — K750 Abscess of liver: Secondary | ICD-10-CM | POA: Diagnosis not present

## 2022-01-15 DIAGNOSIS — I10 Essential (primary) hypertension: Secondary | ICD-10-CM | POA: Diagnosis present

## 2022-01-15 DIAGNOSIS — Z6832 Body mass index (BMI) 32.0-32.9, adult: Secondary | ICD-10-CM

## 2022-01-15 DIAGNOSIS — R0600 Dyspnea, unspecified: Secondary | ICD-10-CM | POA: Diagnosis present

## 2022-01-15 DIAGNOSIS — Z66 Do not resuscitate: Secondary | ICD-10-CM | POA: Diagnosis present

## 2022-01-15 DIAGNOSIS — C250 Malignant neoplasm of head of pancreas: Secondary | ICD-10-CM | POA: Diagnosis present

## 2022-01-15 DIAGNOSIS — E861 Hypovolemia: Secondary | ICD-10-CM | POA: Diagnosis present

## 2022-01-15 DIAGNOSIS — E119 Type 2 diabetes mellitus without complications: Secondary | ICD-10-CM | POA: Diagnosis present

## 2022-01-15 DIAGNOSIS — C259 Malignant neoplasm of pancreas, unspecified: Secondary | ICD-10-CM

## 2022-01-15 DIAGNOSIS — B379 Candidiasis, unspecified: Secondary | ICD-10-CM | POA: Diagnosis present

## 2022-01-15 DIAGNOSIS — G894 Chronic pain syndrome: Secondary | ICD-10-CM | POA: Diagnosis not present

## 2022-01-15 DIAGNOSIS — K7460 Unspecified cirrhosis of liver: Secondary | ICD-10-CM | POA: Diagnosis present

## 2022-01-15 DIAGNOSIS — E1165 Type 2 diabetes mellitus with hyperglycemia: Secondary | ICD-10-CM

## 2022-01-15 DIAGNOSIS — Z7984 Long term (current) use of oral hypoglycemic drugs: Secondary | ICD-10-CM

## 2022-01-15 DIAGNOSIS — I81 Portal vein thrombosis: Secondary | ICD-10-CM | POA: Diagnosis present

## 2022-01-15 DIAGNOSIS — Z833 Family history of diabetes mellitus: Secondary | ICD-10-CM

## 2022-01-15 DIAGNOSIS — E782 Mixed hyperlipidemia: Secondary | ICD-10-CM | POA: Diagnosis present

## 2022-01-15 LAB — LIPASE, BLOOD: Lipase: 23 U/L (ref 11–51)

## 2022-01-15 LAB — COMPREHENSIVE METABOLIC PANEL
ALT: 36 U/L (ref 0–44)
AST: 41 U/L (ref 15–41)
Albumin: 2.2 g/dL — ABNORMAL LOW (ref 3.5–5.0)
Alkaline Phosphatase: 105 U/L (ref 38–126)
Anion gap: 10 (ref 5–15)
BUN: 13 mg/dL (ref 8–23)
CO2: 26 mmol/L (ref 22–32)
Calcium: 8.1 mg/dL — ABNORMAL LOW (ref 8.9–10.3)
Chloride: 92 mmol/L — ABNORMAL LOW (ref 98–111)
Creatinine, Ser: 0.67 mg/dL (ref 0.44–1.00)
GFR, Estimated: >60 mL/min (ref >60)
Glucose, Bld: 283 mg/dL — ABNORMAL HIGH (ref 70–99)
Potassium: 4.1 mmol/L (ref 3.5–5.1)
Sodium: 128 mmol/L — ABNORMAL LOW (ref 135–145)
Total Bilirubin: 0.8 mg/dL (ref 0.3–1.2)
Total Protein: 6.9 g/dL (ref 6.5–8.1)

## 2022-01-15 LAB — CBC WITH DIFFERENTIAL/PLATELET
Abs Immature Granulocytes: 0.47 10*3/uL — ABNORMAL HIGH (ref 0.00–0.07)
Basophils Absolute: 0.1 10*3/uL (ref 0.0–0.1)
Basophils Relative: 1 %
Eosinophils Absolute: 0 10*3/uL (ref 0.0–0.5)
Eosinophils Relative: 0 %
HCT: 36.5 % (ref 36.0–46.0)
Hemoglobin: 11.8 g/dL — ABNORMAL LOW (ref 12.0–15.0)
Immature Granulocytes: 5 %
Lymphocytes Relative: 12 %
Lymphs Abs: 1.1 10*3/uL (ref 0.7–4.0)
MCH: 27.5 pg (ref 26.0–34.0)
MCHC: 32.3 g/dL (ref 30.0–36.0)
MCV: 85.1 fL (ref 80.0–100.0)
Monocytes Absolute: 0.9 10*3/uL (ref 0.1–1.0)
Monocytes Relative: 10 %
Neutro Abs: 6.5 10*3/uL (ref 1.7–7.7)
Neutrophils Relative %: 72 %
Platelets: 538 10*3/uL — ABNORMAL HIGH (ref 150–400)
RBC: 4.29 MIL/uL (ref 3.87–5.11)
RDW: 16 % — ABNORMAL HIGH (ref 11.5–15.5)
WBC: 9.1 10*3/uL (ref 4.0–10.5)
nRBC: 0 % (ref 0.0–0.2)

## 2022-01-15 LAB — GLUCOSE, CAPILLARY: Glucose-Capillary: 75 mg/dL (ref 70–99)

## 2022-01-15 LAB — BRAIN NATRIURETIC PEPTIDE: B Natriuretic Peptide: 242.1 pg/mL — ABNORMAL HIGH (ref 0.0–100.0)

## 2022-01-15 LAB — TROPONIN I (HIGH SENSITIVITY): Troponin I (High Sensitivity): 12 ng/L (ref <18)

## 2022-01-15 IMAGING — DX DG CHEST 1V PORT
1 series · 1 of 1 positions shown · non-contrast
Comparison: [DATE]

CLINICAL DATA: Short of breath

EXAM:
PORTABLE CHEST 1 VIEW

[chest ap]
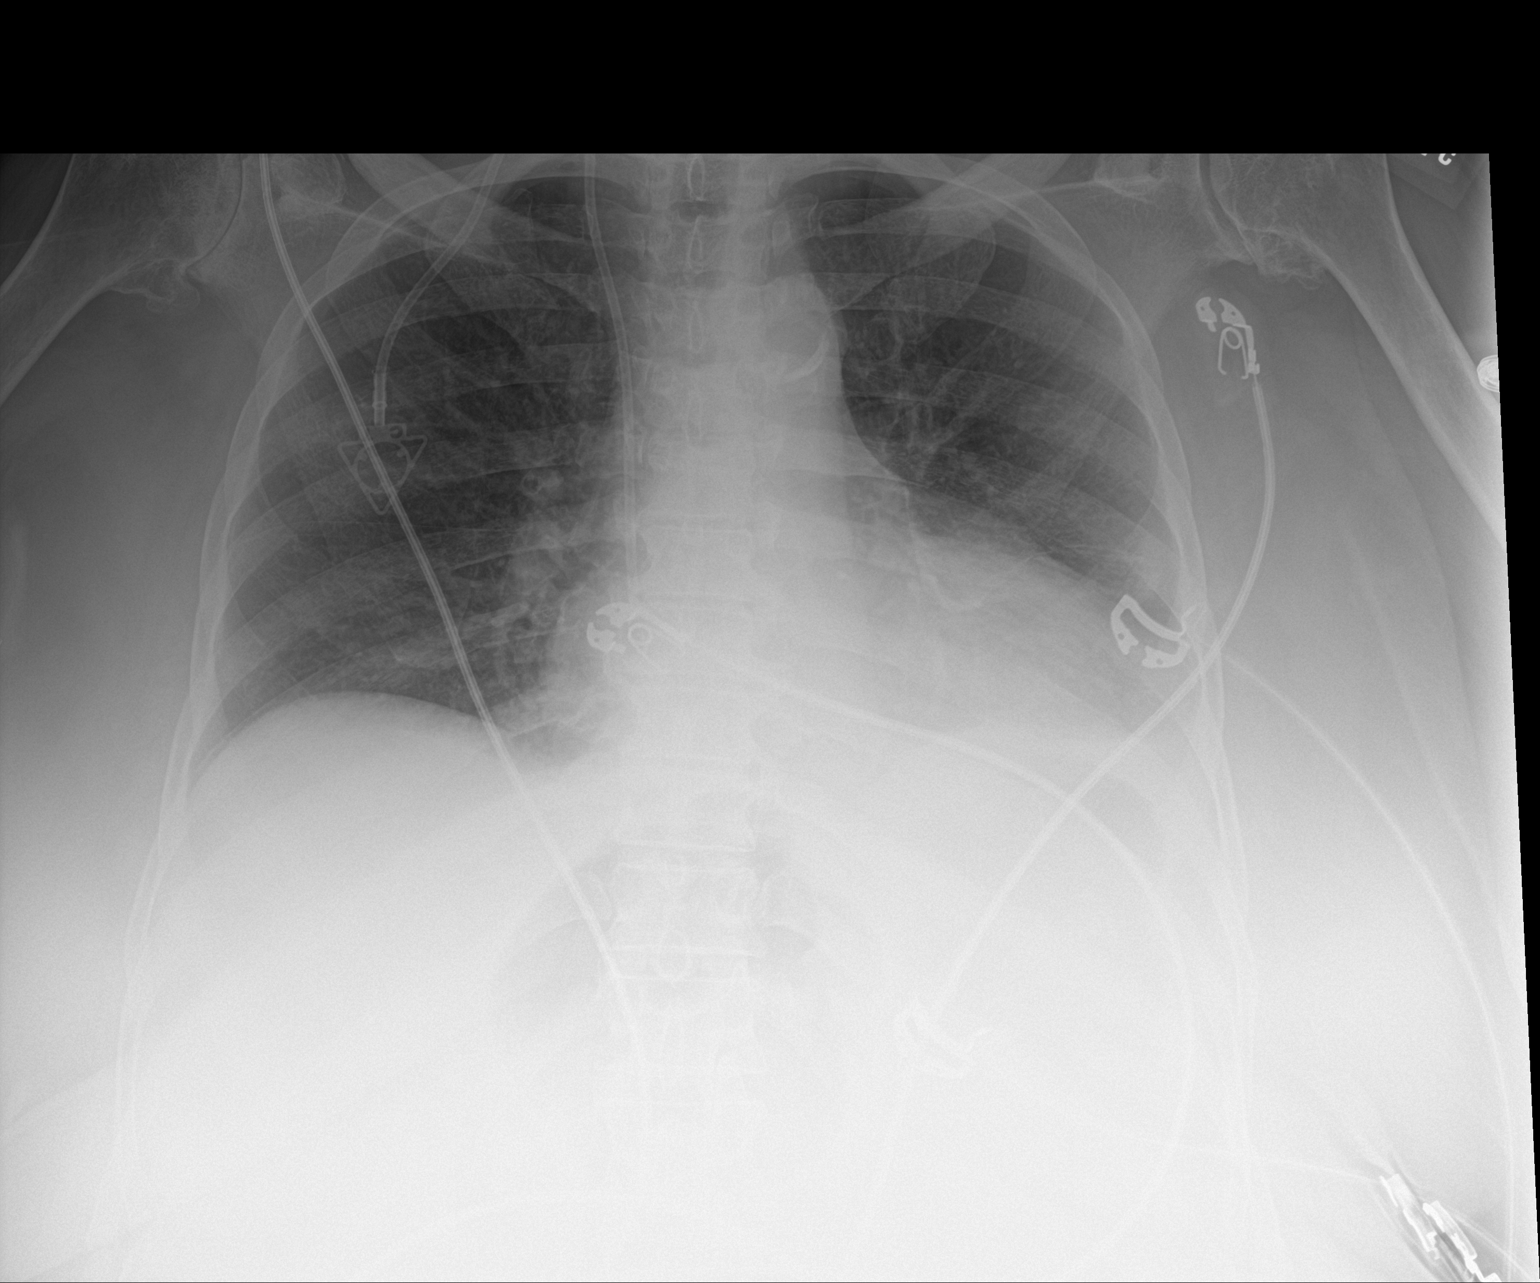

[1 of 1 positions shown; findings below may reference images not displayed]

FINDINGS: Heart size mildly enlarged. Negative for heart failure or edema.
Mild left lower lobe atelectasis/infiltrate. Right lung clear. No
effusion.

Port-A-Cath tip in the lower SVC at the cavoatrial junction

Degenerative change in both shoulders right greater than left
IMPRESSION: Mild left lower lobe airspace disease likely atelectasis

Port-A-Cath tip at the cavoatrial junction.

## 2022-01-15 IMAGING — CT CT ABD-PELV W/ CM
2 of 5 series · 15 of 46 positions shown, 17 images · IV contrast (agent unspecified)
Comparison: [DATE].

CLINICAL DATA: Acute generalized abdominal pain. History of
pancreatic cancer.

EXAM:
CT ABDOMEN AND PELVIS WITH CONTRAST
TECHNIQUE: Multidetector CT imaging of the abdomen and pelvis was performed
using the standard protocol following bolus administration of
intravenous contrast.

[Series 3: axial st · axial · 0.84mm/px · z∈[+791,+1191]mm · 12 of 94 slices shown, 14 images]
[im 7/94  soft-tissue]
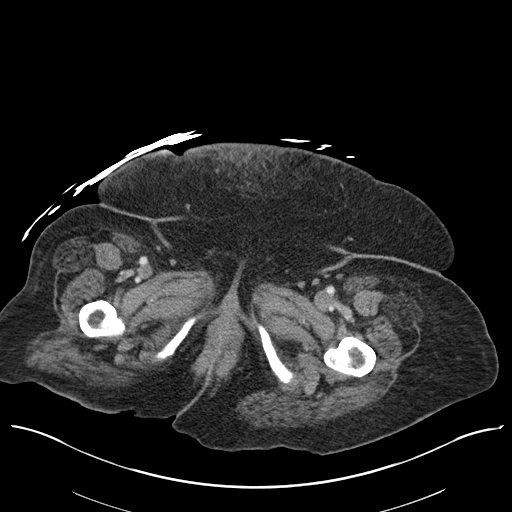
[im 7/94  bone]
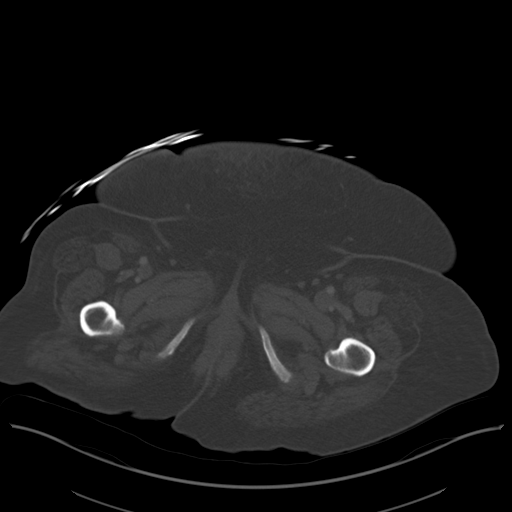
[im 13/94  soft-tissue]
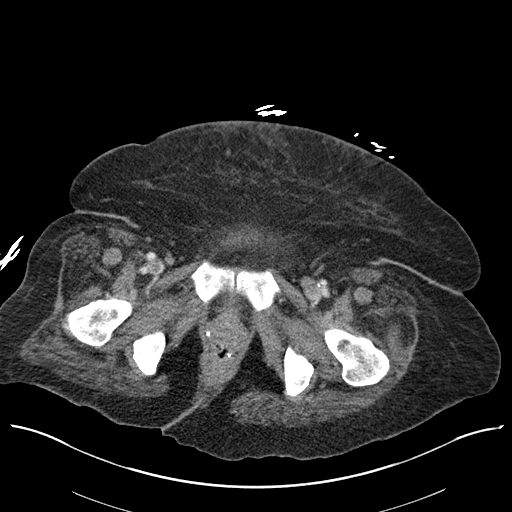
[im 19/94  soft-tissue]
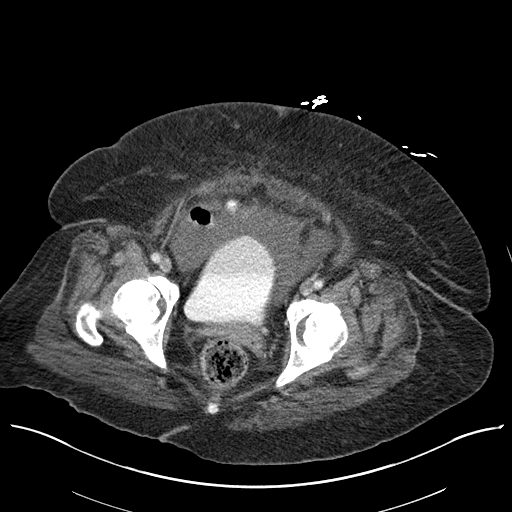
[im 32/94  soft-tissue]
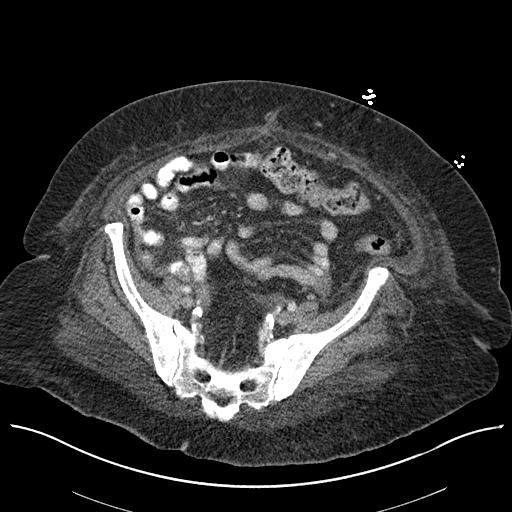
[im 38/94  soft-tissue]
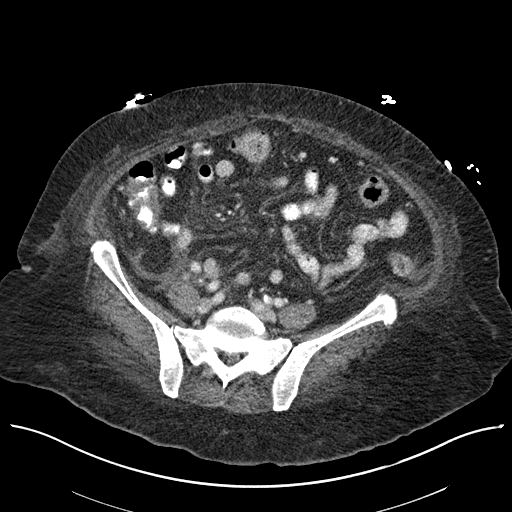
[im 44/94  soft-tissue]
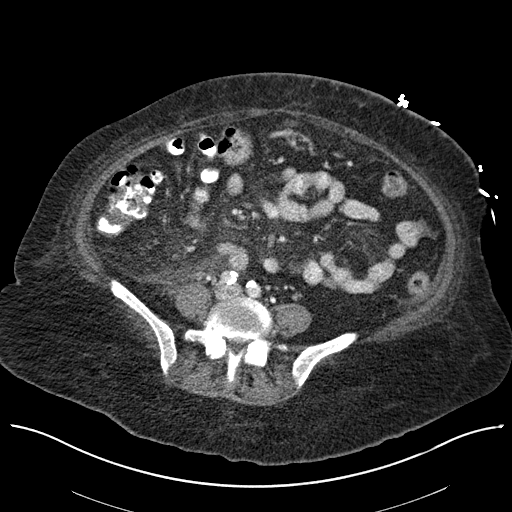
[im 50/94  soft-tissue]
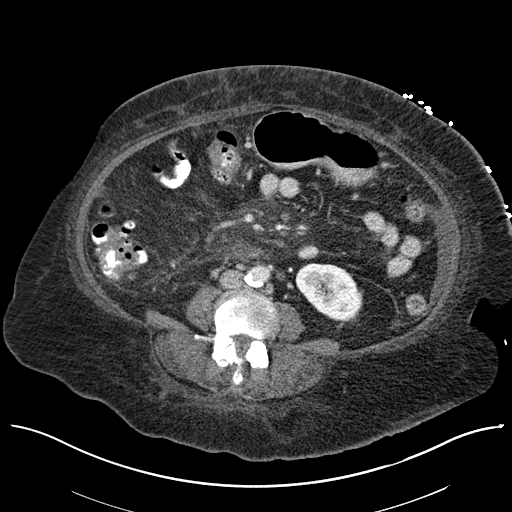
[im 56/94  soft-tissue]
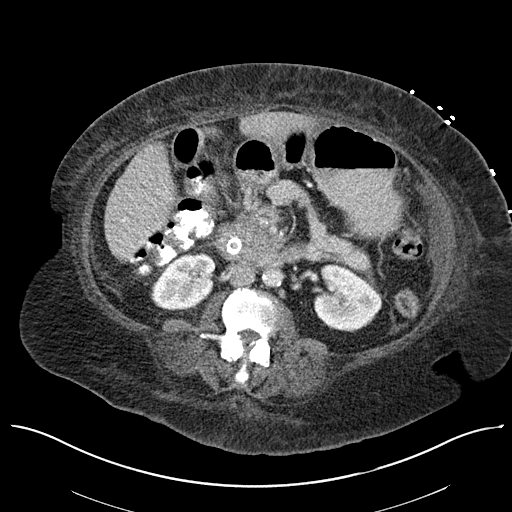
[im 63/94  soft-tissue]
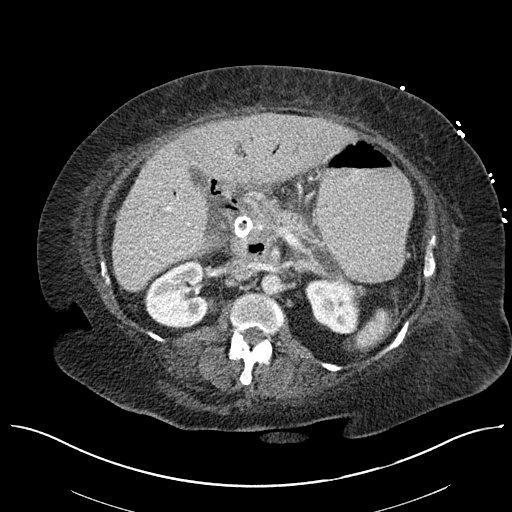
[im 63/94  bone]
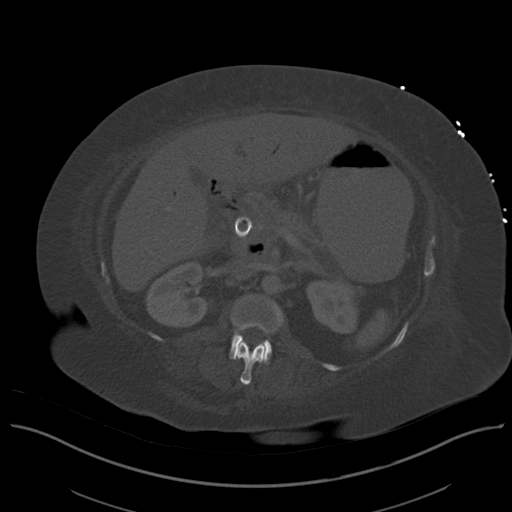
[im 75/94  soft-tissue]
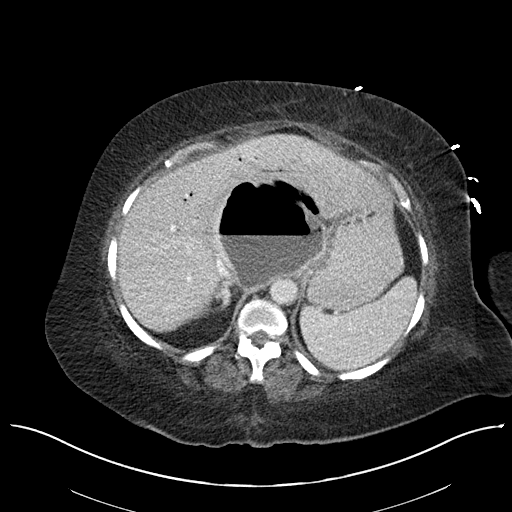
[im 81/94  soft-tissue]
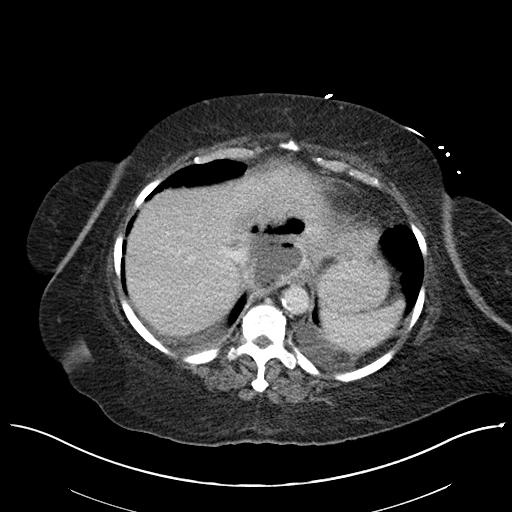
[im 87/94  soft-tissue]
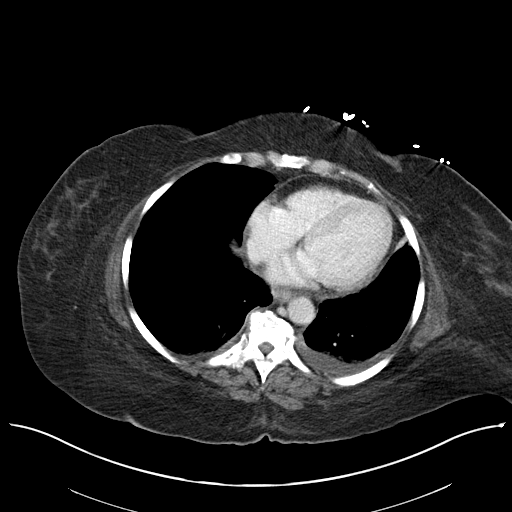

[Series 6: coronal st · coronal · 0.79mm/px · 3 of 171 slices shown]
[im 57/171  soft-tissue]
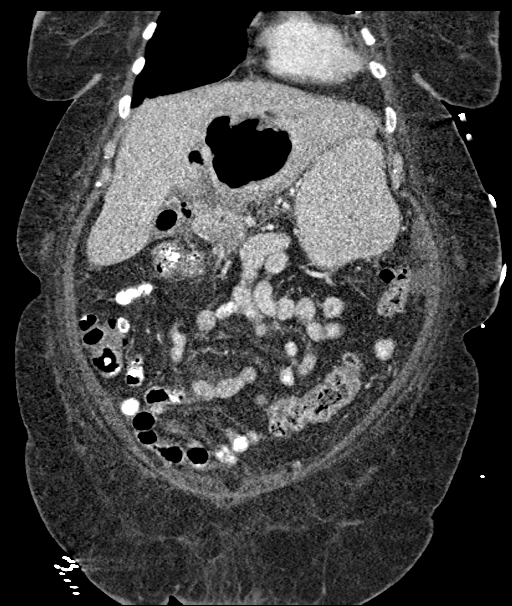
[im 76/171  soft-tissue]
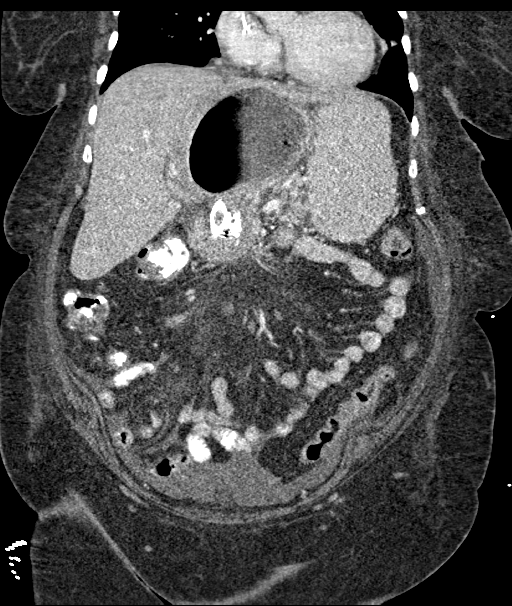
[im 95/171  soft-tissue]
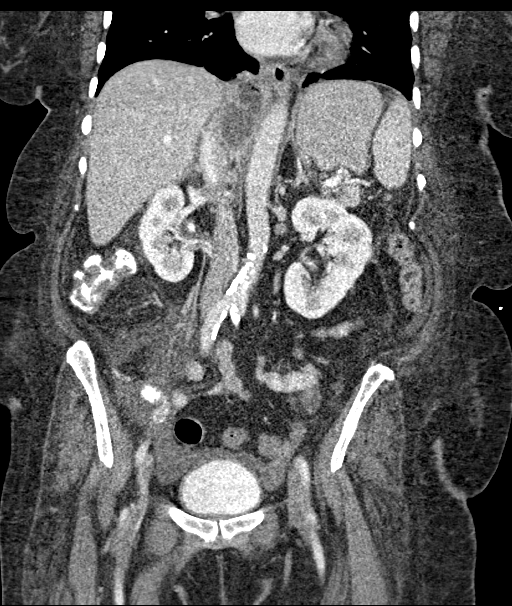

[15 of 46 positions shown; findings below may reference images not displayed]

RADIATION DOSE REDUCTION: This exam was performed according to the
departmental dose-optimization program which includes automated
exposure control, adjustment of the mA and/or kV according to
patient size and/or use of iterative reconstruction technique.

CONTRAST:  100mL OMNIPAQUE IOHEXOL 300 MG/ML  SOLN
FINDINGS: Lower chest: Minimal bilateral posterior basilar subsegmental
atelectasis is noted.

Hepatobiliary: Hepatic pneumobilia is noted most likely due to
common bile duct stent. There is interval development of 9.8 x
cm air-fluid collection in the central portion of the liver
concerning for hepatic abscess. No gallstones are noted, although
pneumobilia has extended into the gallbladder lumen.

Pancreas: There is again noted pancreatic head mass that surrounds
the common bile duct, and appears to be resulting in occlusion of
the superior mesenteric and splenic veins. Severe pancreatic ductal
dilatation is noted secondary to this tumor.

Spleen: Normal in size without focal abnormality.

Adrenals/Urinary Tract: Adrenal glands are unremarkable. No
hydronephrosis or renal obstruction is noted. No renal or ureteral
calculi are noted. 1.4 cm partially enhancing exophytic mass is seen
arising from lower pole of right kidney which was present on prior
exam and concerning for possible neoplasm. Bladder is unremarkable.

Stomach/Bowel: Stomach is within normal limits. Appendix appears
normal. No evidence of bowel wall thickening, distention, or
inflammatory changes.

Vascular/Lymphatic: Aortic atherosclerosis. Mildly enlarged
periaortic adenopathy is noted concerning for metastatic disease.

Reproductive: Uterus and bilateral adnexa are unremarkable.

Other: No hernia is noted.  Mild ascites is noted in the pelvis.

Musculoskeletal: No acute or significant osseous findings.
IMPRESSION: Interval development of 9.8 x 9.2 cm air-fluid collection in the
central portion of the liver concerning for hepatic abscess.

There is again noted pancreatic head mass surrounds the common bile
duct, and appears to be resulting in occlusion of the superior
mesenteric and splenic veins as noted on prior exam. Severe
pancreatic ductal dilatation is noted.

Hepatic pneumobilia is noted secondary to common bile duct stent.

1.4 cm partially enhancing exophytic mass is seen arising from lower
pole right kidney concerning for possible neoplasm or malignancy.

Mildly enlarged periaortic adenopathy is noted concerning for
metastatic disease.

Mild ascites is noted in the pelvis.

Aortic Atherosclerosis ([TM]-[TM]).

## 2022-01-15 IMAGING — CT CT ANGIO CHEST
2 of 6 series · 18 of 36 positions shown · IV contrast (agent unspecified)
Comparison: Chest radiographs [DATE].

CLINICAL DATA: Shortness of breath with sensation of "feeling
funny" over the last couple days. History of pancreatic cancer.
Clinical concern for pulmonary embolism.

EXAM:
CT ANGIOGRAPHY CHEST WITH CONTRAST
TECHNIQUE: Multidetector CT imaging of the chest was performed using the
standard protocol during bolus administration of intravenous
contrast. Multiplanar CT image reconstructions and MIPs were
obtained to evaluate the vascular anatomy.

[Series 5: thins · axial · 0.62mm/px · z∈[+1200,+1434]mm · 17 of 263 slices shown]
[im 15/263  lung]
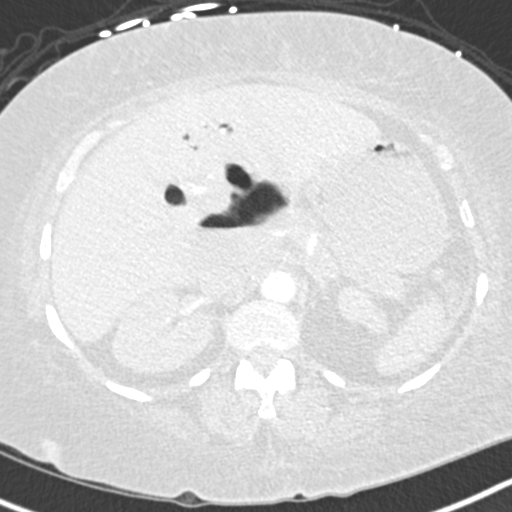
[im 30/263  mediastinal]
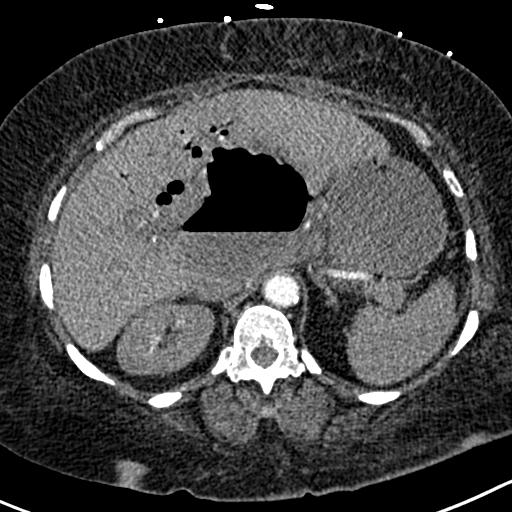
[im 44/263  lung]
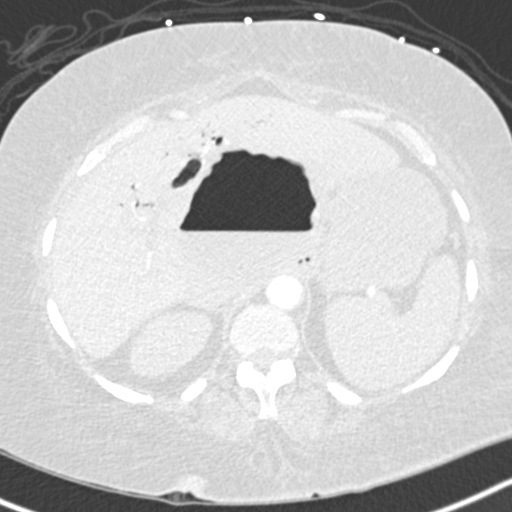
[im 59/263  mediastinal]
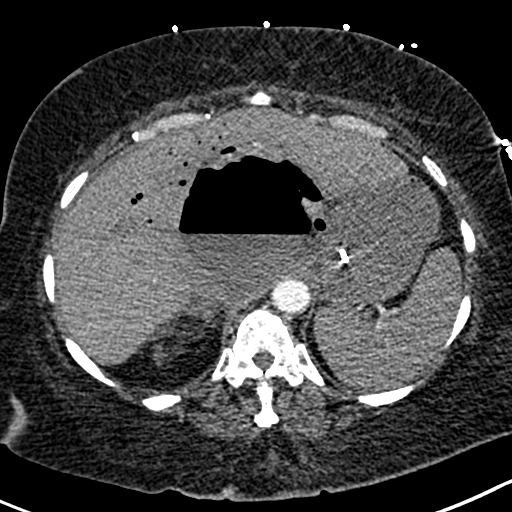
[im 73/263  lung]
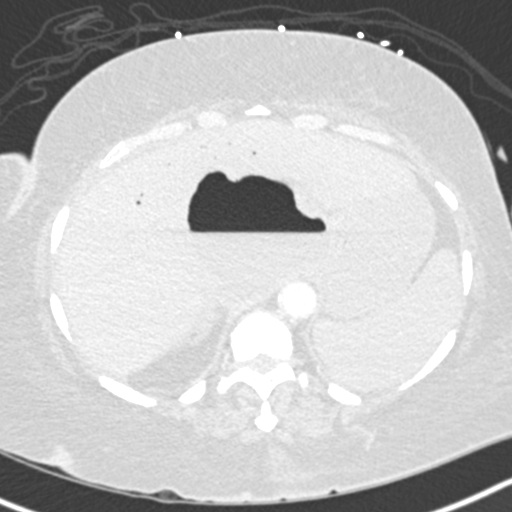
[im 88/263  mediastinal]
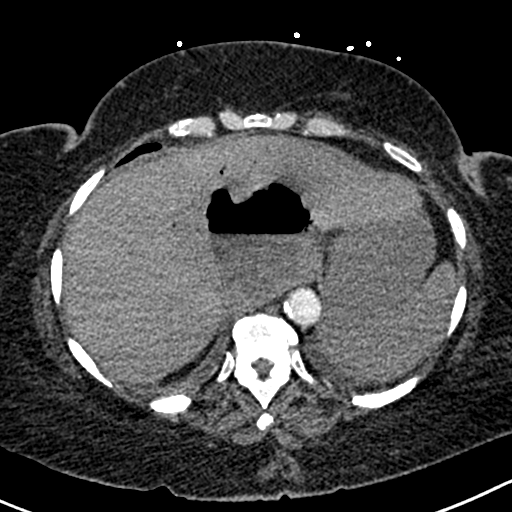
[im 102/263  lung]
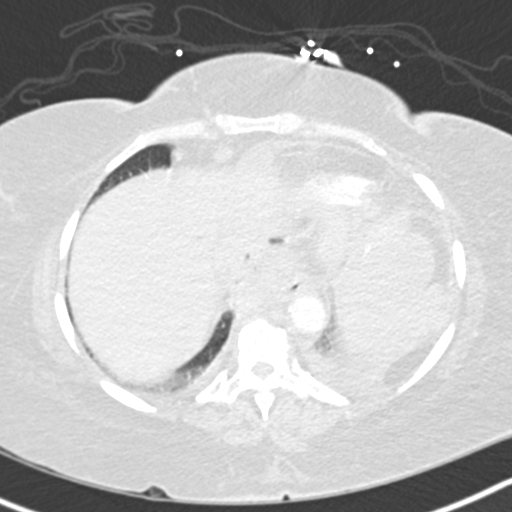
[im 117/263  mediastinal]
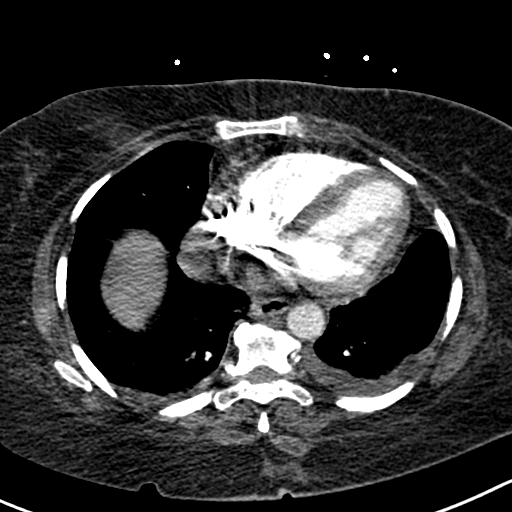
[im 132/263  lung]
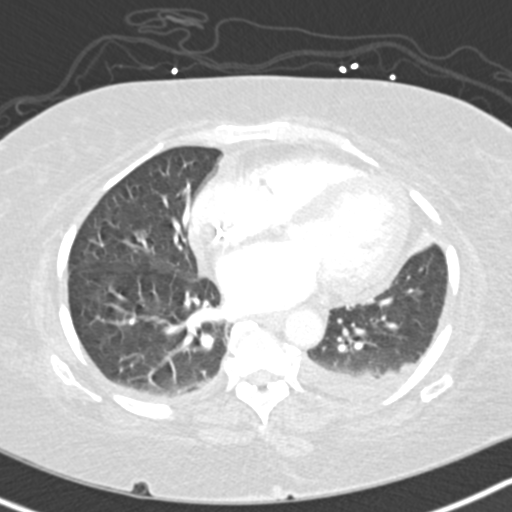
[im 146/263  mediastinal]
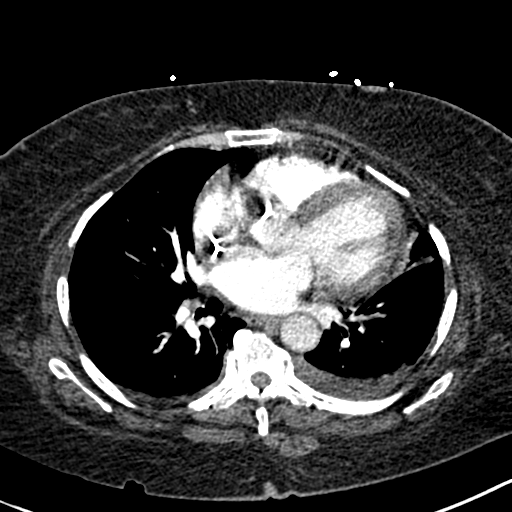
[im 161/263  lung]
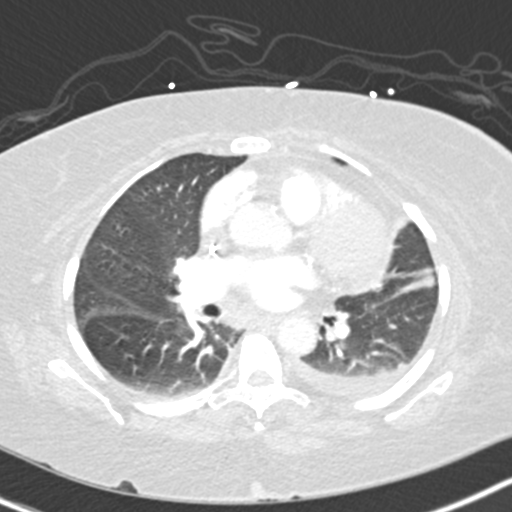
[im 175/263  mediastinal]
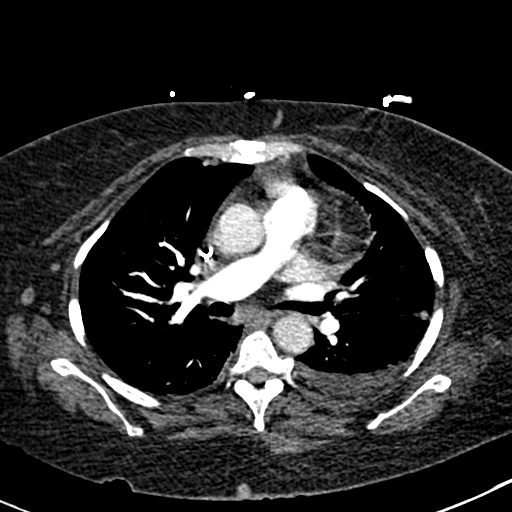
[im 190/263  lung]
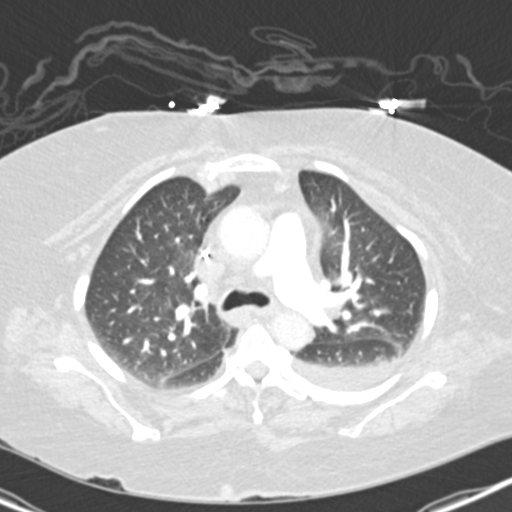
[im 204/263  mediastinal]
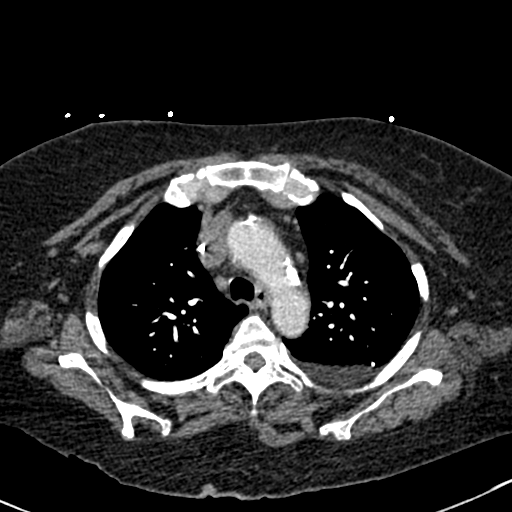
[im 219/263  lung]
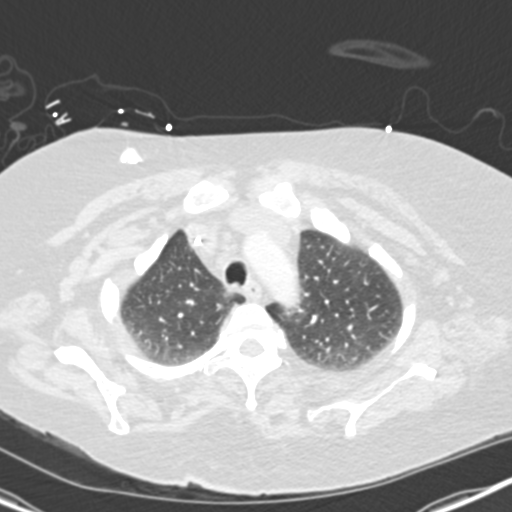
[im 233/263  mediastinal]
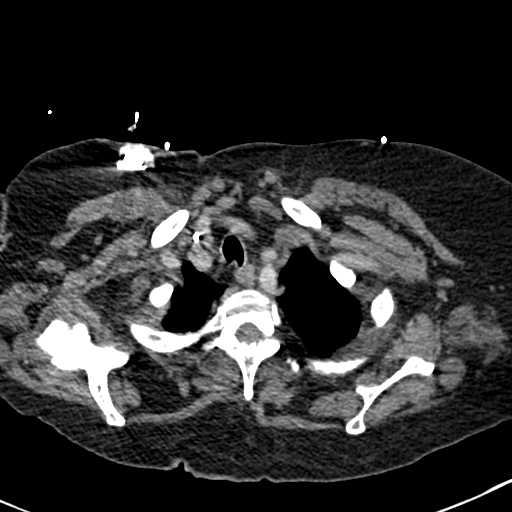
[im 248/263  lung]
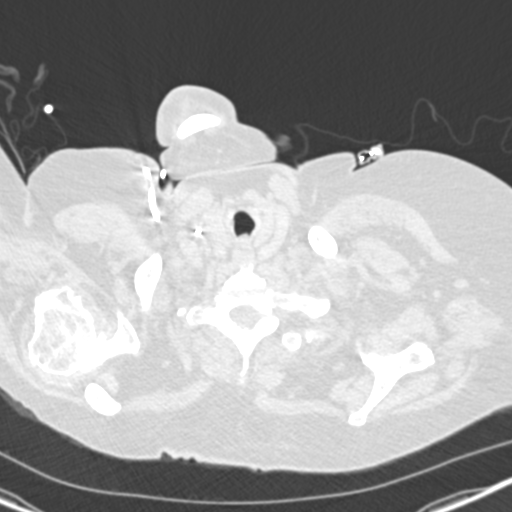

[Series 7: coronal mpr · coronal · 0.54mm/px · 1 of 128 slices shown]
[im 64/128  mediastinal]
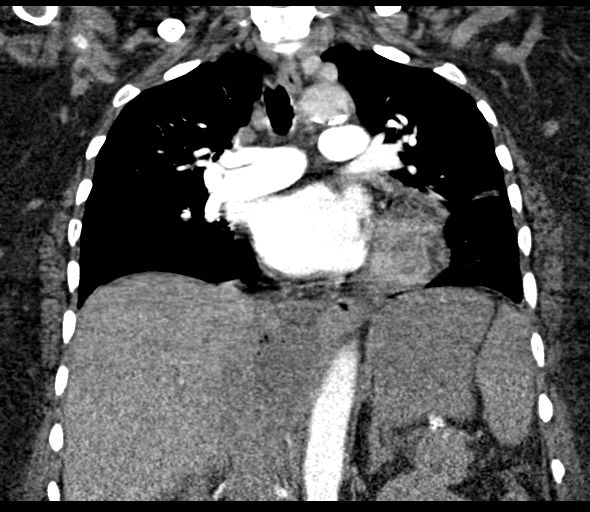

[18 of 36 positions shown; findings below may reference images not displayed]

RADIATION DOSE REDUCTION: This exam was performed according to the
departmental dose-optimization program which includes automated
exposure control, adjustment of the mA and/or kV according to
patient size and/or use of iterative reconstruction technique.

CONTRAST:  55mL OMNIPAQUE IOHEXOL 350 MG/ML SOLN
CTs of the chest, abdomen
and pelvis [DATE] and CT of the abdomen and pelvis [DATE].
FINDINGS: Cardiovascular: The pulmonary arteries are well opacified with
contrast to the level of the subsegmental branches. There is no
evidence of acute pulmonary embolism. Right IJ Port-A-Cath extends
into the right atrium. There is atherosclerosis of the aorta, great
vessels and coronary arteries. The heart is mildly enlarged. No
significant pericardial fluid.

Mediastinum/Nodes: No enlarged mediastinal, hilar or axillary lymph
nodes. Mildly prominent mediastinal lymph nodes are likely reactive.
The thyroid gland, trachea and esophagus demonstrate no significant
findings.

Lungs/Pleura: New small left greater than right pleural effusions.
Mild dependent atelectasis in both lungs. No confluent airspace
opacity, suspicious pulmonary nodule or pneumothorax.

Upper abdomen: Interval development of a large collection of air and
gas within the porta hepatis/gastrohepatic ligament, measuring
approximately 10.0 x 7.4 x 7.8 cm. This lies superior to metallic
biliary stent. There is increased pneumobilia. No free
intraperitoneal air or other extraluminal fluid collection
identified in the upper abdomen.

Musculoskeletal/Chest wall: Chronic glenohumeral arthropathy, right
greater than left. Mild thoracic spondylosis. No acute osseous
findings or chest wall masses.

Review of the MIP images confirms the above findings.
IMPRESSION: 1. No evidence of acute pulmonary embolism.
2. New small bilateral pleural effusions with mild atelectasis at
both lung bases.
3. New large collection of gas and air within the porta
hepatis/gastrohepatic ligament compared with abdominal CT of last
month. This could reflect an abscess or contained bowel perforation.
Given venous thrombosis on prior abdominal CT, this could reflect
cavitation of an infarct. Recommend further evaluation with
dedicated CT of the abdomen and pelvis to include enteric contrast.
4. These results were called by telephone at the time of
interpretation on [DATE] at [DATE] to provider ESCARLATA ,
who verbally acknowledged these results.

## 2022-01-15 MED ORDER — ONDANSETRON HCL 4 MG PO TABS: 4.0000 mg | ORAL_TABLET | Freq: Four times a day (QID) | ORAL | Status: DC | PRN

## 2022-01-15 MED ORDER — ACETAMINOPHEN 650 MG RE SUPP: 650.0000 mg | Freq: Four times a day (QID) | RECTAL | Status: DC | PRN

## 2022-01-15 MED ORDER — ONDANSETRON HCL 4 MG/2ML IJ SOLN: 4.0000 mg | Freq: Four times a day (QID) | INTRAMUSCULAR | Status: DC | PRN

## 2022-01-15 MED ORDER — ACETAMINOPHEN 325 MG PO TABS: 650.0000 mg | ORAL_TABLET | Freq: Four times a day (QID) | ORAL | Status: DC | PRN

## 2022-01-15 MED ORDER — INSULIN ASPART 100 UNIT/ML IJ SOLN
0.0000 [IU] | Freq: Three times a day (TID) | INTRAMUSCULAR | Status: DC
  Administered 2022-01-17: 3 [IU] via SUBCUTANEOUS
  Administered 2022-01-17: 2 [IU] via SUBCUTANEOUS
  Administered 2022-01-18 (×3): 3 [IU] via SUBCUTANEOUS
  Administered 2022-01-19: 2 [IU] via SUBCUTANEOUS
  Administered 2022-01-19 – 2022-01-21 (×5): 3 [IU] via SUBCUTANEOUS
  Administered 2022-01-21: 2 [IU] via SUBCUTANEOUS
  Administered 2022-01-21 – 2022-01-22 (×2): 3 [IU] via SUBCUTANEOUS

## 2022-01-15 MED ORDER — SODIUM CHLORIDE 0.9 % IV BOLUS
1000.0000 mL | Freq: Once | INTRAVENOUS | Status: AC
  Administered 2022-01-15: 1000 mL via INTRAVENOUS

## 2022-01-15 MED ORDER — HYDROMORPHONE HCL 1 MG/ML IJ SOLN
1.0000 mg | Freq: Once | INTRAMUSCULAR | Status: AC
  Administered 2022-01-15: 1 mg via INTRAVENOUS
  Filled 2022-01-15: qty 1

## 2022-01-15 MED ORDER — HYDROMORPHONE HCL 4 MG PO TABS
4.0000 mg | ORAL_TABLET | ORAL | Status: DC | PRN
  Administered 2022-01-15 – 2022-01-21 (×3): 4 mg via ORAL
  Filled 2022-01-15 (×3): qty 1

## 2022-01-15 MED ORDER — POLYETHYLENE GLYCOL 3350 17 G PO PACK
17.0000 g | PACK | Freq: Every day | ORAL | Status: DC
  Administered 2022-01-15: 17 g via ORAL
  Filled 2022-01-15: qty 1

## 2022-01-15 MED ORDER — IOHEXOL 350 MG/ML SOLN
80.0000 mL | Freq: Once | INTRAVENOUS | Status: AC | PRN
  Administered 2022-01-15: 55 mL via INTRAVENOUS

## 2022-01-15 MED ORDER — PANCRELIPASE (LIP-PROT-AMYL) 36000-114000 UNITS PO CPEP
72000.0000 [IU] | ORAL_CAPSULE | Freq: Three times a day (TID) | ORAL | Status: DC
  Administered 2022-01-16 – 2022-01-22 (×16): 72000 [IU] via ORAL
  Filled 2022-01-15 (×22): qty 2

## 2022-01-15 MED ORDER — IOHEXOL 300 MG/ML  SOLN
100.0000 mL | Freq: Once | INTRAMUSCULAR | Status: AC | PRN
  Administered 2022-01-15: 100 mL via INTRAVENOUS

## 2022-01-15 MED ORDER — INSULIN ASPART 100 UNIT/ML IJ SOLN: 0.0000 [IU] | Freq: Every day | INTRAMUSCULAR | Status: DC

## 2022-01-15 MED ORDER — METRONIDAZOLE 500 MG/100ML IV SOLN
500.0000 mg | Freq: Two times a day (BID) | INTRAVENOUS | Status: DC
  Administered 2022-01-15 – 2022-01-18 (×6): 500 mg via INTRAVENOUS
  Filled 2022-01-15 (×6): qty 100

## 2022-01-15 MED ORDER — SODIUM CHLORIDE (PF) 0.9 % IJ SOLN
INTRAMUSCULAR | Status: AC
  Filled 2022-01-15: qty 50

## 2022-01-15 MED ORDER — SODIUM CHLORIDE 0.9 % IV SOLN
INTRAVENOUS | Status: DC
Start: 2022-01-15 — End: 2022-01-22

## 2022-01-15 MED ORDER — IRBESARTAN 75 MG PO TABS
37.5000 mg | ORAL_TABLET | Freq: Every day | ORAL | Status: DC
  Administered 2022-01-16 – 2022-01-22 (×7): 37.5 mg via ORAL
  Filled 2022-01-15 (×7): qty 1

## 2022-01-15 MED ORDER — IOHEXOL 9 MG/ML PO SOLN
1000.0000 mL | ORAL | Status: AC
  Administered 2022-01-15: 1000 mL via ORAL

## 2022-01-15 MED ORDER — FENTANYL 25 MCG/HR TD PT72
1.0000 | MEDICATED_PATCH | TRANSDERMAL | Status: DC
  Administered 2022-01-18 – 2022-01-21 (×2): 1 via TRANSDERMAL
  Filled 2022-01-15 (×2): qty 1

## 2022-01-15 MED ORDER — ATORVASTATIN CALCIUM 20 MG PO TABS
20.0000 mg | ORAL_TABLET | Freq: Every day | ORAL | Status: DC
  Administered 2022-01-15 – 2022-01-16 (×2): 20 mg via ORAL
  Filled 2022-01-15 (×3): qty 1

## 2022-01-15 MED ORDER — PIPERACILLIN-TAZOBACTAM 3.375 G IVPB 30 MIN
3.3750 g | Freq: Once | INTRAVENOUS | Status: AC
  Administered 2022-01-15: 3.375 g via INTRAVENOUS
  Filled 2022-01-15: qty 50

## 2022-01-15 MED ORDER — AMLODIPINE BESYLATE 10 MG PO TABS
10.0000 mg | ORAL_TABLET | Freq: Every day | ORAL | Status: DC
  Administered 2022-01-15 – 2022-01-21 (×7): 10 mg via ORAL
  Filled 2022-01-15 (×7): qty 1

## 2022-01-15 MED ORDER — HEPARIN SODIUM (PORCINE) 5000 UNIT/ML IJ SOLN
5000.0000 [IU] | Freq: Three times a day (TID) | INTRAMUSCULAR | Status: DC
  Administered 2022-01-15 – 2022-01-18 (×7): 5000 [IU] via SUBCUTANEOUS
  Filled 2022-01-15 (×7): qty 1

## 2022-01-15 MED ORDER — SODIUM CHLORIDE 0.9 % IV SOLN
2.0000 g | INTRAVENOUS | Status: DC
  Administered 2022-01-15 – 2022-01-17 (×3): 2 g via INTRAVENOUS
  Filled 2022-01-15 (×3): qty 20

## 2022-01-15 MED ORDER — PANTOPRAZOLE SODIUM 40 MG PO TBEC
40.0000 mg | DELAYED_RELEASE_TABLET | Freq: Every day | ORAL | Status: DC
  Administered 2022-01-16 – 2022-01-17 (×2): 40 mg via ORAL
  Filled 2022-01-15 (×2): qty 1

## 2022-01-15 NOTE — Progress Notes (Signed)
BG= 75, pt given orange juice to sustain BG levels thru the night,  will monitor closely

## 2022-01-15 NOTE — ED Provider Notes (Signed)
Signed out by Dr Vernell Barrier to check ct abd/pelvis and admit to medicine.   CT with large hepatic lesion/fluid collection with AF level - not present on imaging last month. Unclear if abscess, given lack of fever, wbc normal, no severe ruq pain, etc.   Will give dose iv abx. Medicine consult for admission.      Lajean Saver, MD 01/15/22 [phone]

## 2022-01-15 NOTE — ED Provider Notes (Signed)
[facility] DEPT Provider Note   CSN: [id number] Arrival date & time: 01/15/22  1054     History  Chief Complaint  Patient presents with   Shortness of Breath    Melanie Alvarez is a 66 y.o. female.  Patient here with shortness of breath.  Currently undergoing treatment for pancreatic cancer.  Had blood clots in her abdominal veins and is on Eliquis.  States that she has had shortness of breath the last several days.  She is required blood transfusions since restarting chemotherapy.  Felt slightly worse today.  Denies any abdominal pain nausea or vomiting.  Denies any chest pain.  Nothing has made it worse or better.Although she states activities makes it worse.  Denies any cough or sputum production.  She has a history of diabetes as well.  The history is provided by the patient.  Shortness of Breath Severity:  Mild Onset quality:  Gradual Duration: last couple days. Timing:  Constant Progression:  Worsening Chronicity:  New Relieved by:  Nothing Worsened by:  Nothing Associated symptoms: no abdominal pain, no chest pain, no claudication, no cough, no diaphoresis, no ear pain, no fever, no headaches, no hemoptysis, no neck pain, no PND, no rash, no sore throat, no sputum production, no syncope, no swollen glands, no vomiting and no wheezing       Home Medications Prior to Admission medications   Medication Sig Start Date End Date Taking? Authorizing Provider  amLODipine (NORVASC) 10 MG tablet Take 1 tablet (10 mg total) by mouth at bedtime. 11/27/21   Nche, Charlene Brooke, NP  APIXABAN Arne [place]) VTE STARTER PACK ('10MG'$  AND '5MG'$ ) Take as directed on package: start with two-'5mg'$  tablets twice daily for 7 days. On day 8, switch to one-'5mg'$  tablet twice daily. 12/24/21   Volanda Napoleon, MD  atorvastatin (LIPITOR) 20 MG tablet Take 1 tablet (20 mg total) by mouth at bedtime. 01/25/21   Nche, Charlene Brooke, NP  calcium-vitamin D (OSCAL WITH D) 500-200 MG-UNIT  tablet Take 1 tablet by mouth.    [provider]  CREON 36000-114000 units CPEP capsule TAKE 2 CAPSULES BY MOUTH THREE TIMES DAILY BEFORE MEAL(S) 11/19/21   Volanda Napoleon, MD  fentaNYL (DURAGESIC) 25 MCG/HR Place 1 patch onto the skin every 3 (three) days. 01/02/22   Volanda Napoleon, MD  fluticasone (FLONASE) 50 MCG/ACT nasal spray Place 2 sprays into both nostrils daily. Patient not taking: Reported on 12/24/2021 05/14/21   Tower, Wynelle Fanny, MD  glipiZIDE (GLUCOTROL XL) 5 MG 24 hr tablet Take 1 tablet (5 mg total) by mouth daily with breakfast. 09/14/21   Nche, Charlene Brooke, NP  glucose blood test strip Test blood glucose once daily. One Touch Verio test strips Patient not taking: Reported on 12/24/2021 09/28/19   Nche, Charlene Brooke, NP  HYDROmorphone (DILAUDID) 4 MG tablet Take 1 tablet (4 mg total) by mouth every 4 (four) hours as needed for severe pain. 01/02/22   Volanda Napoleon, MD  lidocaine-prilocaine (EMLA) cream Apply to affected area once 12/26/21   Volanda Napoleon, MD  metFORMIN (GLUCOPHAGE XR) 750 MG 24 hr tablet Take 1 tablet (750 mg total) by mouth daily with breakfast. 09/14/21   Nche, Charlene Brooke, NP  metoprolol succinate (TOPROL-XL) 25 MG 24 hr tablet TAKE 1 TABLET BY MOUTH ONCE DAILY 01/25/21   Nche, Charlene Brooke, NP  olmesartan (BENICAR) 5 MG tablet Take 1 tablet (5 mg total) by mouth daily. 01/25/21   Wilfred Lacy  Lum, NP  ondansetron (ZOFRAN) 8 MG tablet Take 1 tablet (8 mg total) by mouth 2 (two) times daily as needed (Nausea or vomiting). 12/26/21   Volanda Napoleon, MD  pantoprazole (PROTONIX) 40 MG tablet Take 1 tablet (40 mg total) by mouth 2 (two) times daily for 14 days. 12/04/21 12/18/21  Nche, Charlene Brooke, NP  pantoprazole (PROTONIX) 40 MG tablet Take 1 tablet (40 mg total) by mouth daily. 12/24/21   Volanda Napoleon, MD  potassium chloride (KLOR-CON M) 20 MEQ tablet Take 1 tablet (20 mEq total) by mouth 2 (two) times daily. Patient not taking: Reported on 12/24/2021  11/27/21   Nche, Charlene Brooke, NP  prochlorperazine (COMPAZINE) 10 MG tablet Take 1 tablet (10 mg total) by mouth every 6 (six) hours as needed (Nausea or vomiting). 12/26/21   Volanda Napoleon, MD      Allergies    Lisinopril    Review of Systems   Review of Systems  Constitutional:  Negative for diaphoresis and fever.  HENT:  Negative for ear pain and sore throat.   Respiratory:  Positive for shortness of breath. Negative for cough, hemoptysis, sputum production and wheezing.   Cardiovascular:  Negative for chest pain, claudication, syncope and PND.  Gastrointestinal:  Negative for abdominal pain and vomiting.  Musculoskeletal:  Negative for neck pain.  Skin:  Negative for rash.  Neurological:  Negative for headaches.   Physical Exam Updated Vital Signs BP 132/84   Pulse 86   Temp 98.3 F (36.8 C) (Oral)   Resp 14   Ht 5' 3.5" (1.613 m)   Wt 85.7 kg   SpO2 96%   BMI 32.95 kg/m  Physical Exam Vitals and nursing note reviewed.  Constitutional:      General: She is not in acute distress.    Appearance: She is well-developed. She is not ill-appearing.  HENT:     Head: Normocephalic and atraumatic.     Mouth/Throat:     Mouth: Mucous membranes are moist.  Eyes:     Conjunctiva/sclera: Conjunctivae normal.     Pupils: Pupils are equal, round, and reactive to light.  Cardiovascular:     Rate and Rhythm: Normal rate and regular rhythm.     Heart sounds: No murmur heard. Pulmonary:     Effort: Pulmonary effort is normal. No respiratory distress.     Breath sounds: Normal breath sounds. No decreased breath sounds or wheezing.  Abdominal:     Palpations: Abdomen is soft.     Tenderness: There is no abdominal tenderness. There is no guarding or rebound.  Musculoskeletal:        General: No swelling. Normal range of motion.     Cervical back: Normal range of motion and neck supple.  Skin:    General: Skin is warm and dry.     Capillary Refill: Capillary refill takes less  than 2 seconds.  Neurological:     Mental Status: She is alert.  Psychiatric:        Mood and Affect: Mood normal.    ED Results / Procedures / Treatments   Labs (all labs ordered are listed, but only abnormal results are displayed) Labs Reviewed  CBC WITH DIFFERENTIAL/PLATELET - Abnormal; Notable for the following components:      Result Value   Hemoglobin 11.8 (*)    RDW 16.0 (*)    Platelets 538 (*)    Abs Immature Granulocytes 0.47 (*)    All other components within normal limits  COMPREHENSIVE METABOLIC PANEL - Abnormal; Notable for the following components:   Sodium 128 (*)    Chloride 92 (*)    Glucose, Bld 283 (*)    Calcium 8.1 (*)    Albumin 2.2 (*)    All other components within normal limits  BRAIN NATRIURETIC PEPTIDE - Abnormal; Notable for the following components:   B Natriuretic Peptide 242.1 (*)    All other components within normal limits  LIPASE, BLOOD  TROPONIN I (HIGH SENSITIVITY)    EKG EKG Interpretation  Date/Time:  Tuesday Jan 15 2022 11:02:25 EDT Ventricular Rate:  97 PR Interval:  154 QRS Duration: 77 QT Interval:  321 QTC Calculation: 408 R Axis:   54 Text Interpretation: Sinus rhythm Atrial premature complex Confirmed by Lennice Sites ([id number]) on 01/15/2022 11:33:22 AM  Radiology CT Angio Chest PE W and/or Wo Contrast  Result Date: 01/15/2022 CLINICAL DATA:  Shortness of breath with sensation of "feeling funny" over the last couple days. History of pancreatic cancer. Clinical concern for pulmonary embolism. EXAM: CT ANGIOGRAPHY CHEST WITH CONTRAST TECHNIQUE: Multidetector CT imaging of the chest was performed using the standard protocol during bolus administration of intravenous contrast. Multiplanar CT image reconstructions and MIPs were obtained to evaluate the vascular anatomy. RADIATION DOSE REDUCTION: This exam was performed according to the departmental dose-optimization program which includes automated exposure control, adjustment of the  mA and/or kV according to patient size and/or use of iterative reconstruction technique. CONTRAST:  56m OMNIPAQUE IOHEXOL 350 MG/ML SOLN COMPARISON:  Chest radiographs 01/15/2022. CTs of the chest, abdomen and pelvis 12/21/2020 and CT of the abdomen and pelvis 12/13/2021. FINDINGS: Cardiovascular: The pulmonary arteries are well opacified with contrast to the level of the subsegmental branches. There is no evidence of acute pulmonary embolism. Right IJ Port-A-Cath extends into the right atrium. There is atherosclerosis of the aorta, great vessels and coronary arteries. The heart is mildly enlarged. No significant pericardial fluid. Mediastinum/Nodes: No enlarged mediastinal, hilar or axillary lymph nodes. Mildly prominent mediastinal lymph nodes are likely reactive. The thyroid gland, trachea and esophagus demonstrate no significant findings. Lungs/Pleura: New small left greater than right pleural effusions. Mild dependent atelectasis in both lungs. No confluent airspace opacity, suspicious pulmonary nodule or pneumothorax. Upper abdomen: Interval development of a large collection of air and gas within the porta hepatis/gastrohepatic ligament, measuring approximately 10.0 x 7.4 x 7.8 cm. This lies superior to metallic biliary stent. There is increased pneumobilia. No free intraperitoneal air or other extraluminal fluid collection identified in the upper abdomen. Musculoskeletal/Chest wall: Chronic glenohumeral arthropathy, right greater than left. Mild thoracic spondylosis. No acute osseous findings or chest wall masses. Review of the MIP images confirms the above findings. IMPRESSION: 1. No evidence of acute pulmonary embolism. 2. New small bilateral pleural effusions with mild atelectasis at both lung bases. 3. New large collection of gas and air within the porta hepatis/gastrohepatic ligament compared with abdominal CT of last month. This could reflect an abscess or contained bowel perforation. Given venous  thrombosis on prior abdominal CT, this could reflect cavitation of an infarct. Recommend further evaluation with dedicated CT of the abdomen and pelvis to include enteric contrast. 4. These results were called by telephone at the time of interpretation on 01/15/2022 at 1:11 pm to provider Kilynn Fitzsimmons , who verbally acknowledged these results. Electronically Signed   By: WRichardean SaleM.D.   On: 01/15/2022 13:12   DG Chest Portable 1 View  Result Date: 01/15/2022 CLINICAL DATA:  Short of breath  EXAM: PORTABLE CHEST 1 VIEW COMPARISON:  09/19/2014 FINDINGS: Heart size mildly enlarged. Negative for heart failure or edema. Mild left lower lobe atelectasis/infiltrate. Right lung clear. No effusion. Port-A-Cath tip in the lower SVC at the cavoatrial junction Degenerative change in both shoulders right greater than left IMPRESSION: Mild left lower lobe airspace disease likely atelectasis Port-A-Cath tip at the cavoatrial junction. Electronically Signed   By: Franchot Gallo M.D.   On: 01/15/2022 11:15    Procedures Procedures    Medications Ordered in ED Medications  iohexol (OMNIPAQUE) 9 MG/ML oral solution 1,000 mL (has no administration in time range)  HYDROmorphone (DILAUDID) injection 1 mg (1 mg Intravenous Given 01/15/22 1214)  sodium chloride 0.9 % bolus 1,000 mL (1,000 mLs Intravenous New Bag/Given 01/15/22 1257)  iohexol (OMNIPAQUE) 350 MG/ML injection 80 mL (55 mLs Intravenous Contrast Given 01/15/22 1240)    ED Course/ Medical Decision Making/ A&P                           Medical Decision Making Amount and/or Complexity of Data Reviewed Labs: ordered. Radiology: ordered.  Risk Prescription drug management.   Resha L Dickerman is here with shortness of breath.  History of hypertension, diabetes, pancreatic cancer currently undergoing chemotherapy.  This is a recurrent cancer.  She denies any nausea, vomiting, chest pain.  She is required blood transfusions since starting chemotherapy.   She denies any black or bloody stools.  She states that shortness of breath last several days but worse this morning.  She feels dehydrated.  She denies any chest pain.  Denies any infectious symptoms.  She is on blood thinner Eliquis for blood clots that were found at the time of her abdominal mass was found.  She is got clots in the veins in her abdomen per chart review.  Overall differential diagnosis includes pneumonia versus pleural effusion versus PE versus less likely ACS.  This could be due to anemia as well.  Bedside ultrasound did show any signs of ascites and had no concern for that causing her shortness of breath.  We will check CBC, troponin, BNP, CMP, chest x-ray.  Will likely consider CT scan of her chest to rule out PE.  Per my review and interpretation of labs patient has no significant anemia.  Hemoglobin is much improved at 11.8.  No significant leukocytosis.  She is not neutropenic.  Sodium is improved to 128.  Blood sugar mildly elevated at 283 but is not in DKA.  We will give some IV fluids.  Troponin is normal.  Doubt ACS.  Chest x-ray per my review and interpretation shows no obvious pneumonia.  Suspect may be some atelectasis per radiology report on the left side of the lung.  Overall we will pursue CT scan of the chest to further evaluate for PE, pneumonia.  Radiology called me on the phone to discuss CT scan of the chest.  There is no blood clot.  There is no pneumonia.  She has some small bilateral pleural effusions with atelectasis but otherwise lung and heart are unremarkable.  However radiologist did state there is a new large collection of gas and air within the porta hepatis/gastrohepatic ligament compared to CT scan in the last month.  Radiologist is recommending IV and oral contrast of the CT of abdomen pelvis to further evaluate for abscess or contained bowel perforation but given recent venous thrombosis this could be a cavitation of an infarct as well.  Overall  we will  start her on oral contrast prep and eventually will obtain CT scan of abdomen and pelvis.  Patient handed off to oncoming ED staff with patient pending CT.  Please see their report for further results, evaluation, disposition of the patient.  This chart was dictated using voice recognition software.  Despite best efforts to proofread,  errors can occur which can change the documentation meaning.         Final Clinical Impression(s) / ED Diagnoses Final diagnoses:  SOB (shortness of breath)    Rx / DC Orders ED Discharge Orders     None         Lennice Sites, DO 01/15/22 1322

## 2022-01-15 NOTE — Telephone Encounter (Signed)
Message received from patient stating that she needs something called into her pharmacy, d/t her "breathing is not regular."  Call placed back to patient immediately and pt states that she has been short of breath since Saturday and is having no pain, but feels as though she is dehydrated.  Dr. Marin Olp notified and patient instructed to go to the [facility] now per order of Dr. Marin Olp.  Pt states that she will have her sister take her to the [facility] ER now. Dr. Marin Olp notified.

## 2022-01-15 NOTE — ED Triage Notes (Signed)
Pt arrived from home d/t SOB and "feeling funny" over the past couple days. History of pancreatic cancer, A&O x4, denies pain, N/V

## 2022-01-15 NOTE — Progress Notes (Signed)
History and Physical    Melanie Alvarez QMG:[id number] DOB: 17-Apr-1956 DOA: 01/15/2022  PCP: Flossie Buffy, NP  Patient coming from: home  I have personally briefly reviewed patient's old medical records in [place]  Chief Complaint: abdominal pain, dyspnea on exertion  HPI: Melanie Alvarez is a 66 y.o. 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  Patient reports that for the past week, she had progressive dyspnea on exertion.  She also has some diffuse abdominal pain which is limited her ability to eat.  She is feeling increasingly weak.  Feels that her shortness of breath is worse when she is laying down.  When she sits up, and coughs up sputum, she feels that her breathing does improve.  When she belches or passes gas, she feels that her abdominal discomfort is better.  She says she has not had a good bowel movement in approximately 2 weeks.  She has not had any vomiting.  She has not had any fevers.  She has not had any travel history.  ED Course: Work-up in the emergency room showed that she was afebrile and vitals were otherwise stable.  Imaging showed small bilateral pleural effusions, no pneumonia.  There was comment on a large hepatic lesion approximately 10 cm.  She was referred for admission  Review of Systems: As per HPI otherwise 10 point review of systems negative.    Past Medical History:  Diagnosis Date   Cancer ([place])    Cirrhosis ([place])    Diabetes mellitus 2012   Diverticulosis    Gallbladder sludge    Hepatitis 2010   history of Hepatitis C   Hepatitis C    Hypertension 2011   Obesity     Past Surgical History:  Procedure Laterality Date   BILIARY BRUSHING  09/22/2019   Procedure: BILIARY BRUSHING;  Surgeon:  Irving Copas., MD;  Location: [facility];  Service: Gastroenterology;;   BILIARY DILATION  09/22/2019   Procedure: BILIARY DILATION;  Surgeon: Irving Copas., MD;  Location: [facility];  Service: Gastroenterology;;   BILIARY STENT PLACEMENT N/A 09/22/2019   Procedure: BILIARY STENT PLACEMENT;  Surgeon: Irving Copas., MD;  Location: [facility];  Service: Gastroenterology;  Laterality: N/A;   COLONOSCOPY  07/24/2011   Procedure: COLONOSCOPY;  Surgeon: Landry Dyke, MD;  Location: [facility];  Service: Endoscopy;  Laterality: N/A;   Dental Surgery Tooth Extraction     ERCP N/A 09/22/2019   Procedure: ENDOSCOPIC RETROGRADE CHOLANGIOPANCREATOGRAPHY (ERCP);  Surgeon: Irving Copas., MD;  Location: [facility];  Service: Gastroenterology;  Laterality: N/A;   ESOPHAGOGASTRODUODENOSCOPY (EGD) WITH PROPOFOL N/A 09/22/2019   Procedure: ESOPHAGOGASTRODUODENOSCOPY (EGD) WITH PROPOFOL;  Surgeon: Rush Landmark Telford Nab., MD;  Location: [facility];  Service: Gastroenterology;  Laterality: N/A;   ESOPHAGOGASTRODUODENOSCOPY (EGD) WITH PROPOFOL N/A 09/25/2019   Procedure: ESOPHAGOGASTRODUODENOSCOPY (EGD) WITH PROPOFOL;  Surgeon: Rush Landmark Telford Nab., MD;  Location: [facility];  Service: Gastroenterology;  Laterality: N/A;   ESOPHAGOGASTRODUODENOSCOPY (EGD) WITH PROPOFOL N/A 01/19/2020   Procedure: ESOPHAGOGASTRODUODENOSCOPY (EGD) WITH PROPOFOL;  Surgeon: Rush Landmark Telford Nab., MD;  Location: [facility];  Service: Gastroenterology;  Laterality: N/A;   EUS N/A 09/22/2019   Procedure: UPPER ENDOSCOPIC ULTRASOUND (EUS) RADIAL;  Surgeon: Irving Copas., MD;  Location: [facility];  Service: Gastroenterology;  Laterality: N/A;   EUS N/A 01/19/2020   Procedure:  UPPER ENDOSCOPIC ULTRASOUND (EUS) RADIAL;  Surgeon: Rush Landmark Telford Nab., MD;  Location: [facility];  Service: Gastroenterology;  Laterality: N/A;   FIDUCIAL MARKER PLACEMENT N/A 01/19/2020   Procedure:  FIDUCIAL MARKER PLACEMENT;  Surgeon: Irving Copas., MD;  Location: [facility];  Service: Gastroenterology;  Laterality: N/A;   FINE NEEDLE ASPIRATION  09/22/2019   Procedure: FINE NEEDLE ASPIRATION (FNA) LINEAR;  Surgeon: Irving Copas., MD;  Location: [facility];  Service: Gastroenterology;;   FINE NEEDLE ASPIRATION  09/25/2019   Procedure: FINE NEEDLE ASPIRATION (FNA) RADIAL;  Surgeon: Irving Copas., MD;  Location: [facility];  Service: Gastroenterology;;   HEMOSTASIS CLIP PLACEMENT  09/25/2019   Procedure: HEMOSTASIS CLIP PLACEMENT;  Surgeon: Irving Copas., MD;  Location: [facility];  Service: Gastroenterology;;   IR IMAGING GUIDED PORT INSERTION  09/24/2019   IR IMAGING GUIDED PORT INSERTION  12/31/2021   IR REMOVAL TUN ACCESS W/ PORT W/O FL MOD SED  06/16/2020   SPHINCTEROTOMY  09/22/2019   Procedure: Joan Mayans;  Surgeon: Mansouraty, Telford Nab., MD;  Location: [facility];  Service: Gastroenterology;;   UPPER ESOPHAGEAL ENDOSCOPIC ULTRASOUND (EUS) N/A 09/25/2019   Procedure: UPPER ESOPHAGEAL ENDOSCOPIC ULTRASOUND (EUS);  Surgeon: Irving Copas., MD;  Location: [facility];  Service: Gastroenterology;  Laterality: N/A;    Social History:  reports that she quit smoking about 2 years ago. Her smoking use included cigarettes. She smoked an average of .25 packs per day. She has never used smokeless tobacco. She reports that she does not currently use alcohol. She reports that she does not use drugs.  Allergies  Allergen Reactions   Lisinopril Swelling and Other (See Comments)    Facial and upper lip    Family History  Problem Relation Age of Onset   Diabetes Sister    Cancer Maternal Grandmother        leukemia    Diabetes Maternal Grandfather    Diabetes Paternal Grandfather    Diabetes Paternal Grandmother    Hypertension Sister    Hypertension Brother    Anesthesia problems Neg Hx      Prior to Admission medications    Medication Sig Start Date End Date Taking? Authorizing Provider  amLODipine (NORVASC) 10 MG tablet Take 1 tablet (10 mg total) by mouth at bedtime. 11/27/21  Yes Nche, Charlene Brooke, NP  APIXABAN Arne [place]) VTE STARTER PACK ('10MG'$  AND '5MG'$ ) Take as directed on package: start with two-'5mg'$  tablets twice daily for 7 days. On day 8, switch to one-'5mg'$  tablet twice daily. Patient taking differently: Take 5 mg by mouth 2 (two) times daily. 12/24/21  Yes Volanda Napoleon, MD  atorvastatin (LIPITOR) 20 MG tablet Take 1 tablet (20 mg total) by mouth at bedtime. 01/25/21  Yes Nche, Charlene Brooke, NP  CREON 36000-114000 units CPEP capsule TAKE 2 CAPSULES BY MOUTH THREE TIMES DAILY BEFORE MEAL(S) Patient taking differently: Take 72,000 Units by mouth 3 (three) times daily before meals. 11/19/21  Yes Volanda Napoleon, MD  feeding supplement (BOOST HIGH PROTEIN) LIQD Take 2 Containers by mouth in the morning.   Yes [provider]  fentaNYL (DURAGESIC) 25 MCG/HR Place 1 patch onto the skin every 3 (three) days. 01/02/22  Yes Volanda Napoleon, MD  fluticasone (FLONASE) 50 MCG/ACT nasal spray Place 2 sprays into both nostrils daily. Patient taking differently: Place 2 sprays into both nostrils daily as needed for allergies or rhinitis. 05/14/21  Yes Tower, Marne A, MD  glipiZIDE (GLUCOTROL XL) 5 MG 24 hr tablet  Take 1 tablet (5 mg total) by mouth daily with breakfast. 09/14/21  Yes Nche, Charlene Brooke, NP  HYDROmorphone (DILAUDID) 4 MG tablet Take 1 tablet (4 mg total) by mouth every 4 (four) hours as needed for severe pain. 01/02/22  Yes Volanda Napoleon, MD  lidocaine-prilocaine (EMLA) cream Apply to affected area once Patient taking differently: 1 application. as needed (for port access). 12/26/21  Yes Volanda Napoleon, MD  metFORMIN (GLUCOPHAGE XR) 750 MG 24 hr tablet Take 1 tablet (750 mg total) by mouth daily with breakfast. 09/14/21  Yes Nche, Charlene Brooke, NP  olmesartan (BENICAR) 5 MG tablet Take 1 tablet (5 mg  total) by mouth daily. 01/25/21  Yes Nche, Charlene Brooke, NP  ondansetron (ZOFRAN) 8 MG tablet Take 1 tablet (8 mg total) by mouth 2 (two) times daily as needed (Nausea or vomiting). Patient taking differently: Take 8 mg by mouth 2 (two) times daily as needed for nausea or vomiting. 12/26/21  Yes Ennever, Rudell Cobb, MD  pantoprazole (PROTONIX) 40 MG tablet Take 1 tablet (40 mg total) by mouth daily. 12/24/21  Yes Ennever, Rudell Cobb, MD  prochlorperazine (COMPAZINE) 10 MG tablet Take 1 tablet (10 mg total) by mouth every 6 (six) hours as needed (Nausea or vomiting). Patient taking differently: Take 10 mg by mouth every 6 (six) hours as needed for nausea or vomiting. 12/26/21  Yes Ennever, Rudell Cobb, MD  TYLENOL 8 HOUR 650 MG CR tablet Take 650-1,300 mg by mouth every 8 (eight) hours as needed for pain.   Yes [provider]  glucose blood test strip Test blood glucose once daily. One Touch Verio test strips 09/28/19   Nche, Charlene Brooke, NP  metoprolol succinate (TOPROL-XL) 25 MG 24 hr tablet TAKE 1 TABLET BY MOUTH ONCE DAILY Patient not taking: Reported on 01/15/2022 01/25/21   Nche, Charlene Brooke, NP  pantoprazole (PROTONIX) 40 MG tablet Take 1 tablet (40 mg total) by mouth 2 (two) times daily for 14 days. Patient not taking: Reported on 01/15/2022 12/04/21 12/18/21  Nche, Charlene Brooke, NP  potassium chloride (KLOR-CON M) 20 MEQ tablet Take 1 tablet (20 mEq total) by mouth 2 (two) times daily. Patient not taking: Reported on 01/15/2022 11/27/21   Flossie Buffy, NP    Physical Exam: Vitals:   01/15/22 1300 01/15/22 1600 01/15/22 1651 01/15/22 1801  BP: 132/84 (!) 134/92 139/89 (!) 142/76  Pulse: 86 84 81 82  Resp: '14  18 18  '$ Temp:  98.3 F (36.8 C) 97.9 F (36.6 C) (!) 97.4 F (36.3 C)  TempSrc:  Oral Oral Oral  SpO2: 96% 97% 98% 100%  Weight:      Height:        Constitutional: NAD, calm, comfortable Eyes: PERRL, lids and conjunctivae normal ENMT: Mucous membranes are moist. Posterior  pharynx clear of any exudate or lesions.Normal dentition.  Neck: normal, supple, no masses, no thyromegaly Respiratory: clear to auscultation bilaterally, no wheezing, no crackles. Normal respiratory effort. No accessory muscle use.  Cardiovascular: Regular rate and rhythm, no murmurs / rubs / gallops. No extremity edema. 2+ pedal pulses. No carotid bruits.  Abdomen: soft, diffusely tender, distended, no masses palpated. No hepatosplenomegaly. Bowel sounds positive.  Musculoskeletal: no clubbing / cyanosis. No joint deformity upper and lower extremities. Good ROM, no contractures. Normal muscle tone.  Skin: no rashes, lesions, ulcers. No induration Neurologic: CN 2-12 grossly intact. Sensation intact, DTR normal. Strength 5/5 in all 4.  Psychiatric: Normal judgment and insight. Alert and  oriented x 3. Normal mood.    Labs on Admission: I have personally reviewed following labs and imaging studies  CBC: Recent Labs  Lab 01/09/22 0821 01/15/22 1116  WBC 7.6 9.1  NEUTROABS 6.3 6.5  HGB 7.4* 11.8*  HCT 22.7* 36.5  MCV 81.7 85.1  PLT 298 967*   Basic Metabolic Panel: Recent Labs  Lab 01/09/22 0821 01/15/22 1116  NA 123* 128*  K 4.8 4.1  CL 87* 92*  CO2 28 26  GLUCOSE 208* 283*  BUN 12 13  CREATININE 0.68 0.67  CALCIUM 8.7* 8.1*   GFR: Estimated Creatinine Clearance: 74.5 mL/min (by C-G formula based on SCr of 0.67 mg/dL). Liver Function Tests: Recent Labs  Lab 01/09/22 0821 01/15/22 1116  AST 46* 41  ALT 43 36  ALKPHOS 128* 105  BILITOT 0.8 0.8  PROT 6.7 6.9  ALBUMIN 2.8* 2.2*   Recent Labs  Lab 01/15/22 1116  LIPASE 23   No results for input(s): AMMONIA in the last 168 hours. Coagulation Profile: No results for input(s): INR, PROTIME in the last 168 hours. Cardiac Enzymes: No results for input(s): CKTOTAL, CKMB, CKMBINDEX, TROPONINI in the last 168 hours. BNP (last 3 results) No results for input(s): PROBNP in the last 8760 hours. HbA1C: No results for  input(s): HGBA1C in the last 72 hours. CBG: No results for input(s): GLUCAP in the last 168 hours. Lipid Profile: No results for input(s): CHOL, HDL, LDLCALC, TRIG, CHOLHDL, LDLDIRECT in the last 72 hours. Thyroid Function Tests: No results for input(s): TSH, T4TOTAL, FREET4, T3FREE, THYROIDAB in the last 72 hours. Anemia Panel: No results for input(s): VITAMINB12, FOLATE, FERRITIN, TIBC, IRON, RETICCTPCT in the last 72 hours. Urine analysis:    Component Value Date/Time   COLORURINE YELLOW 11/27/2021 0903   APPEARANCEUR CLEAR 11/27/2021 0903   LABSPEC 1.019 11/27/2021 0903   PHURINE 6.0 11/27/2021 0903   GLUCOSEU 1+ (A) 11/27/2021 0903   HGBUR NEGATIVE 11/27/2021 0903   BILIRUBINUR SMALL (A) 09/20/2019 1951   BILIRUBINUR NEGATIVE 10/22/2018 1556   KETONESUR NEGATIVE 11/27/2021 0903   PROTEINUR 1+ (A) 11/27/2021 0903   UROBILINOGEN 0.2 10/22/2018 1556   NITRITE NEGATIVE 09/20/2019 1951   LEUKOCYTESUR NEGATIVE 09/20/2019 1951    Radiological Exams on Admission: CT Angio Chest PE W and/or Wo Contrast  Result Date: 01/15/2022 CLINICAL DATA:  Shortness of breath with sensation of "feeling funny" over the last couple days. History of pancreatic cancer. Clinical concern for pulmonary embolism. EXAM: CT ANGIOGRAPHY CHEST WITH CONTRAST TECHNIQUE: Multidetector CT imaging of the chest was performed using the standard protocol during bolus administration of intravenous contrast. Multiplanar CT image reconstructions and MIPs were obtained to evaluate the vascular anatomy. RADIATION DOSE REDUCTION: This exam was performed according to the departmental dose-optimization program which includes automated exposure control, adjustment of the mA and/or kV according to patient size and/or use of iterative reconstruction technique. CONTRAST:  61m OMNIPAQUE IOHEXOL 350 MG/ML SOLN COMPARISON:  Chest radiographs 01/15/2022. CTs of the chest, abdomen and pelvis 12/21/2020 and CT of the abdomen and pelvis  12/13/2021. FINDINGS: Cardiovascular: The pulmonary arteries are well opacified with contrast to the level of the subsegmental branches. There is no evidence of acute pulmonary embolism. Right IJ Port-A-Cath extends into the right atrium. There is atherosclerosis of the aorta, great vessels and coronary arteries. The heart is mildly enlarged. No significant pericardial fluid. Mediastinum/Nodes: No enlarged mediastinal, hilar or axillary lymph nodes. Mildly prominent mediastinal lymph nodes are likely reactive. The thyroid gland, trachea and  esophagus demonstrate no significant findings. Lungs/Pleura: New small left greater than right pleural effusions. Mild dependent atelectasis in both lungs. No confluent airspace opacity, suspicious pulmonary nodule or pneumothorax. Upper abdomen: Interval development of a large collection of air and gas within the porta hepatis/gastrohepatic ligament, measuring approximately 10.0 x 7.4 x 7.8 cm. This lies superior to metallic biliary stent. There is increased pneumobilia. No free intraperitoneal air or other extraluminal fluid collection identified in the upper abdomen. Musculoskeletal/Chest wall: Chronic glenohumeral arthropathy, right greater than left. Mild thoracic spondylosis. No acute osseous findings or chest wall masses. Review of the MIP images confirms the above findings. IMPRESSION: 1. No evidence of acute pulmonary embolism. 2. New small bilateral pleural effusions with mild atelectasis at both lung bases. 3. New large collection of gas and air within the porta hepatis/gastrohepatic ligament compared with abdominal CT of last month. This could reflect an abscess or contained bowel perforation. Given venous thrombosis on prior abdominal CT, this could reflect cavitation of an infarct. Recommend further evaluation with dedicated CT of the abdomen and pelvis to include enteric contrast. 4. These results were called by telephone at the time of interpretation on 01/15/2022  at 1:11 pm to provider ADAM CURATOLO , who verbally acknowledged these results. Electronically Signed   By: Richardean Sale M.D.   On: 01/15/2022 13:12   CT ABDOMEN PELVIS W CONTRAST  Result Date: 01/15/2022 CLINICAL DATA:  Acute generalized abdominal pain. History of pancreatic cancer. EXAM: CT ABDOMEN AND PELVIS WITH CONTRAST TECHNIQUE: Multidetector CT imaging of the abdomen and pelvis was performed using the standard protocol following bolus administration of intravenous contrast. RADIATION DOSE REDUCTION: This exam was performed according to the departmental dose-optimization program which includes automated exposure control, adjustment of the mA and/or kV according to patient size and/or use of iterative reconstruction technique. CONTRAST:  162m OMNIPAQUE IOHEXOL 300 MG/ML  SOLN COMPARISON:  December 13, 2021. FINDINGS: Lower chest: Minimal bilateral posterior basilar subsegmental atelectasis is noted. Hepatobiliary: Hepatic pneumobilia is noted most likely due to common bile duct stent. There is interval development of 9.8 x 9.2 cm air-fluid collection in the central portion of the liver concerning for hepatic abscess. No gallstones are noted, although pneumobilia has extended into the gallbladder lumen. Pancreas: There is again noted pancreatic head mass that surrounds the common bile duct, and appears to be resulting in occlusion of the superior mesenteric and splenic veins. Severe pancreatic ductal dilatation is noted secondary to this tumor. Spleen: Normal in size without focal abnormality. Adrenals/Urinary Tract: Adrenal glands are unremarkable. No hydronephrosis or renal obstruction is noted. No renal or ureteral calculi are noted. 1.4 cm partially enhancing exophytic mass is seen arising from lower pole of right kidney which was present on prior exam and concerning for possible neoplasm. Bladder is unremarkable. Stomach/Bowel: Stomach is within normal limits. Appendix appears normal. No evidence of  bowel wall thickening, distention, or inflammatory changes. Vascular/Lymphatic: Aortic atherosclerosis. Mildly enlarged periaortic adenopathy is noted concerning for metastatic disease. Reproductive: Uterus and bilateral adnexa are unremarkable. Other: No hernia is noted.  Mild ascites is noted in the pelvis. Musculoskeletal: No acute or significant osseous findings. IMPRESSION: Interval development of 9.8 x 9.2 cm air-fluid collection in the central portion of the liver concerning for hepatic abscess. There is again noted pancreatic head mass surrounds the common bile duct, and appears to be resulting in occlusion of the superior mesenteric and splenic veins as noted on prior exam. Severe pancreatic ductal dilatation is noted. Hepatic pneumobilia is  noted secondary to common bile duct stent. 1.4 cm partially enhancing exophytic mass is seen arising from lower pole right kidney concerning for possible neoplasm or malignancy. Mildly enlarged periaortic adenopathy is noted concerning for metastatic disease. Mild ascites is noted in the pelvis. Aortic Atherosclerosis (ICD10-I70.0). Electronically Signed   By: Marijo Conception M.D.   On: 01/15/2022 16:02   DG Chest Portable 1 View  Result Date: 01/15/2022 CLINICAL DATA:  Short of breath EXAM: PORTABLE CHEST 1 VIEW COMPARISON:  09/19/2014 FINDINGS: Heart size mildly enlarged. Negative for heart failure or edema. Mild left lower lobe atelectasis/infiltrate. Right lung clear. No effusion. Port-A-Cath tip in the lower SVC at the cavoatrial junction Degenerative change in both shoulders right greater than left IMPRESSION: Mild left lower lobe airspace disease likely atelectasis Port-A-Cath tip at the cavoatrial junction. Electronically Signed   By: Franchot Gallo M.D.   On: 01/15/2022 11:15    EKG: Independently reviewed. Sinus rhythm with PACs  Assessment/Plan Principal Problem:   Hepatic lesion Active Problems:   Hypertension   DM (diabetes mellitus) (HCC)    Mixed hyperlipidemia   Hyponatremia   Malignant neoplasm of head of pancreas (HCC)   Pancreatic insufficiency   Chronic pain syndrome   Portal vein thrombosis   Dyspnea   Constipation     Hepatic lesion -Appears to have some possible fluid component and concern for abscess -Consult interventional radiology for possible aspiration versus drain -Discussed with infectious disease, Dr. Gale Journey who will also consult -Start on ceftriaxone and Flagyl for now  Pancreatic cancer -Currently on chemotherapy -Dr. Marin Olp added to treatment team  Chronic pain syndrome -Continue on home dose of fentanyl patch and oral hydromorphone  Portal vein thrombosis -Chronically on Eliquis -Hold for now pending possible IR procedure  Constipation -Start on daily MiraLAX  Dyspnea -Etiology is not entirely clear -May be related to atelectasis and she does say that her breathing improves after cough and expectoration of sputum -No pneumonia on CT -BNP mildly elevated, check echo  Hyponatremia -Clinically, she appears to be hypovolemic -Provide gentle hydration  Diabetes -Chronically on metformin and glipizide -Holding for now and starting on sliding scale insulin  Hyperlipidemia -Continue statin  Pancreatic insufficiency -Continue Creon with meals  Hypertension -Chronically on Norvasc and Benicar -On admission  DVT prophylaxis: heparin sq  Code Status: DNR  Family Communication: discussed with patient  Disposition Plan: discharge home once work up complete  Consults called: infectious disease, interventional radiology  Admission status: observation, medsurg   Kathie Dike MD [facility]   If 7PM-7AM, please contact night-coverage [email or web address]   01/15/2022, 7:05 PM

## 2022-01-16 ENCOUNTER — Inpatient Hospital Stay (HOSPITAL_COMMUNITY): Payer: BC Managed Care – PPO

## 2022-01-16 ENCOUNTER — Observation Stay (HOSPITAL_COMMUNITY): Payer: BC Managed Care – PPO

## 2022-01-16 DIAGNOSIS — Z7984 Long term (current) use of oral hypoglycemic drugs: Secondary | ICD-10-CM | POA: Diagnosis not present

## 2022-01-16 DIAGNOSIS — E1169 Type 2 diabetes mellitus with other specified complication: Secondary | ICD-10-CM | POA: Diagnosis not present

## 2022-01-16 DIAGNOSIS — E119 Type 2 diabetes mellitus without complications: Secondary | ICD-10-CM | POA: Diagnosis present

## 2022-01-16 DIAGNOSIS — I81 Portal vein thrombosis: Secondary | ICD-10-CM

## 2022-01-16 DIAGNOSIS — B954 Other streptococcus as the cause of diseases classified elsewhere: Secondary | ICD-10-CM | POA: Diagnosis not present

## 2022-01-16 DIAGNOSIS — B952 Enterococcus as the cause of diseases classified elsewhere: Secondary | ICD-10-CM | POA: Diagnosis not present

## 2022-01-16 DIAGNOSIS — E861 Hypovolemia: Secondary | ICD-10-CM | POA: Diagnosis present

## 2022-01-16 DIAGNOSIS — E878 Other disorders of electrolyte and fluid balance, not elsewhere classified: Secondary | ICD-10-CM | POA: Diagnosis not present

## 2022-01-16 DIAGNOSIS — K750 Abscess of liver: Secondary | ICD-10-CM | POA: Diagnosis present

## 2022-01-16 DIAGNOSIS — C250 Malignant neoplasm of head of pancreas: Secondary | ICD-10-CM

## 2022-01-16 DIAGNOSIS — K5900 Constipation, unspecified: Secondary | ICD-10-CM | POA: Diagnosis present

## 2022-01-16 DIAGNOSIS — D75839 Thrombocytosis, unspecified: Secondary | ICD-10-CM | POA: Diagnosis present

## 2022-01-16 DIAGNOSIS — Z833 Family history of diabetes mellitus: Secondary | ICD-10-CM | POA: Diagnosis not present

## 2022-01-16 DIAGNOSIS — I951 Orthostatic hypotension: Secondary | ICD-10-CM | POA: Diagnosis not present

## 2022-01-16 DIAGNOSIS — R0602 Shortness of breath: Secondary | ICD-10-CM | POA: Diagnosis present

## 2022-01-16 DIAGNOSIS — R0609 Other forms of dyspnea: Secondary | ICD-10-CM | POA: Diagnosis present

## 2022-01-16 DIAGNOSIS — I5040 Unspecified combined systolic (congestive) and diastolic (congestive) heart failure: Secondary | ICD-10-CM | POA: Diagnosis not present

## 2022-01-16 DIAGNOSIS — E871 Hypo-osmolality and hyponatremia: Secondary | ICD-10-CM | POA: Diagnosis present

## 2022-01-16 DIAGNOSIS — Z6832 Body mass index (BMI) 32.0-32.9, adult: Secondary | ICD-10-CM | POA: Diagnosis not present

## 2022-01-16 DIAGNOSIS — C259 Malignant neoplasm of pancreas, unspecified: Secondary | ICD-10-CM

## 2022-01-16 DIAGNOSIS — G894 Chronic pain syndrome: Secondary | ICD-10-CM | POA: Diagnosis present

## 2022-01-16 DIAGNOSIS — I10 Essential (primary) hypertension: Secondary | ICD-10-CM

## 2022-01-16 DIAGNOSIS — Z7901 Long term (current) use of anticoagulants: Secondary | ICD-10-CM | POA: Diagnosis not present

## 2022-01-16 DIAGNOSIS — Z79899 Other long term (current) drug therapy: Secondary | ICD-10-CM | POA: Diagnosis not present

## 2022-01-16 DIAGNOSIS — K8689 Other specified diseases of pancreas: Secondary | ICD-10-CM

## 2022-01-16 DIAGNOSIS — E782 Mixed hyperlipidemia: Secondary | ICD-10-CM | POA: Diagnosis present

## 2022-01-16 DIAGNOSIS — K7460 Unspecified cirrhosis of liver: Secondary | ICD-10-CM | POA: Diagnosis present

## 2022-01-16 DIAGNOSIS — K769 Liver disease, unspecified: Secondary | ICD-10-CM | POA: Diagnosis not present

## 2022-01-16 DIAGNOSIS — B379 Candidiasis, unspecified: Secondary | ICD-10-CM | POA: Diagnosis present

## 2022-01-16 DIAGNOSIS — Z66 Do not resuscitate: Secondary | ICD-10-CM | POA: Diagnosis present

## 2022-01-16 DIAGNOSIS — B1920 Unspecified viral hepatitis C without hepatic coma: Secondary | ICD-10-CM | POA: Diagnosis present

## 2022-01-16 LAB — PROTIME-INR
INR: 3.7 — ABNORMAL HIGH (ref 0.8–1.2)
Prothrombin Time: 36.3 seconds — ABNORMAL HIGH (ref 11.4–15.2)

## 2022-01-16 LAB — CBC
HCT: 34.5 % — ABNORMAL LOW (ref 36.0–46.0)
Hemoglobin: 11 g/dL — ABNORMAL LOW (ref 12.0–15.0)
MCH: 27.4 pg (ref 26.0–34.0)
MCHC: 31.9 g/dL (ref 30.0–36.0)
MCV: 86 fL (ref 80.0–100.0)
Platelets: 422 10*3/uL — ABNORMAL HIGH (ref 150–400)
RBC: 4.01 MIL/uL (ref 3.87–5.11)
RDW: 16.1 % — ABNORMAL HIGH (ref 11.5–15.5)
WBC: 9.9 10*3/uL (ref 4.0–10.5)
nRBC: 0 % (ref 0.0–0.2)

## 2022-01-16 LAB — ECHOCARDIOGRAM COMPLETE
AR max vel: 1.63 cm2
AV Area VTI: 1.62 cm2
AV Area mean vel: 1.59 cm2
AV Mean grad: 11 mmHg
AV Peak grad: 20.7 mmHg
Ao pk vel: 2.28 m/s
Area-P 1/2: 5.52 cm2
Calc EF: 43.1 %
Height: 63.5 in
MV VTI: 1.65 cm2
S' Lateral: 3.2 cm
Single Plane A2C EF: 51.5 %
Single Plane A4C EF: 35.7 %
Weight: 3024 oz

## 2022-01-16 LAB — GLUCOSE, CAPILLARY
Glucose-Capillary: 112 mg/dL — ABNORMAL HIGH (ref 70–99)
Glucose-Capillary: 116 mg/dL — ABNORMAL HIGH (ref 70–99)
Glucose-Capillary: 107 mg/dL — ABNORMAL HIGH (ref 70–99)
Glucose-Capillary: 100 mg/dL — ABNORMAL HIGH (ref 70–99)

## 2022-01-16 LAB — COMPREHENSIVE METABOLIC PANEL
ALT: 32 U/L (ref 0–44)
AST: 39 U/L (ref 15–41)
Albumin: 2 g/dL — ABNORMAL LOW (ref 3.5–5.0)
Alkaline Phosphatase: 103 U/L (ref 38–126)
Anion gap: 8 (ref 5–15)
BUN: 9 mg/dL (ref 8–23)
CO2: 26 mmol/L (ref 22–32)
Calcium: 8 mg/dL — ABNORMAL LOW (ref 8.9–10.3)
Chloride: 97 mmol/L — ABNORMAL LOW (ref 98–111)
Creatinine, Ser: 0.47 mg/dL (ref 0.44–1.00)
GFR, Estimated: >60 mL/min (ref >60)
Glucose, Bld: 100 mg/dL — ABNORMAL HIGH (ref 70–99)
Potassium: 3.9 mmol/L (ref 3.5–5.1)
Sodium: 131 mmol/L — ABNORMAL LOW (ref 135–145)
Total Bilirubin: 0.7 mg/dL (ref 0.3–1.2)
Total Protein: 6.5 g/dL (ref 6.5–8.1)

## 2022-01-16 LAB — HIV ANTIBODY (ROUTINE TESTING W REFLEX): HIV Screen 4th Generation wRfx: NONREACTIVE

## 2022-01-16 IMAGING — CT CT IMAGE GUIDED DRAINAGE BY PERCUTANEOUS CATHETER
1 of 2 series · 14 of 32 positions shown, 18 images · non-contrast
Comparison: none

CLINICAL DATA: Pancreatic carcinoma status post biliary stenting.
9.8 cm gas and fluid collection in the porta hepatis

[Series 2: i-spiral 5.0 bf37 · axial · 0.79mm/px · z∈[-184,-51]mm · 14 of 42 slices shown, 18 images]
[im 2/42  soft-tissue]
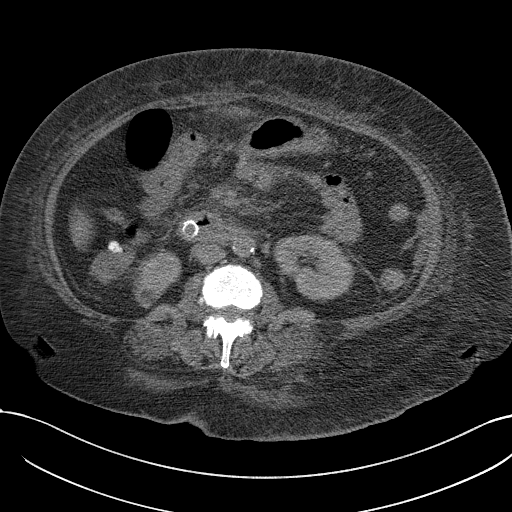
[im 2/42  bone]
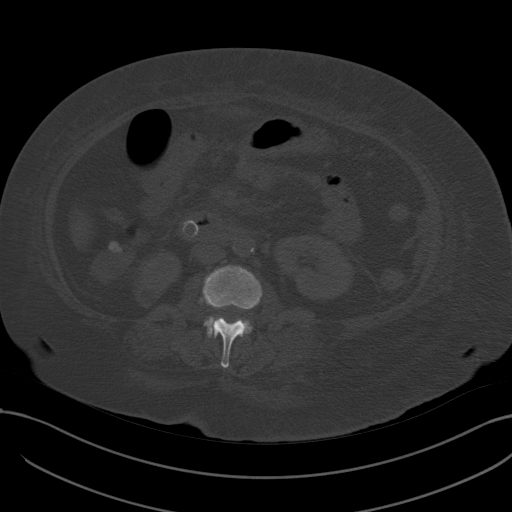
[im 6/42  soft-tissue]
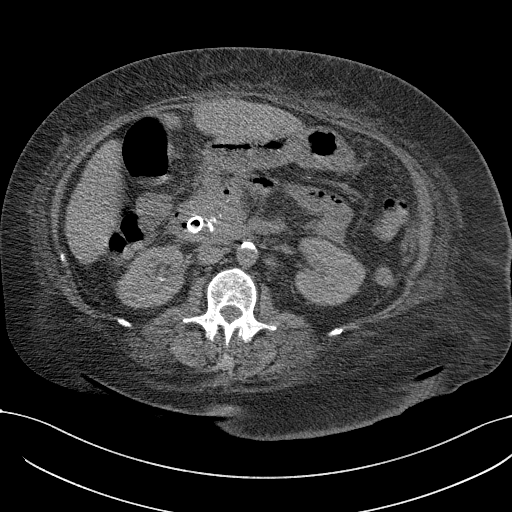
[im 9/42  soft-tissue]
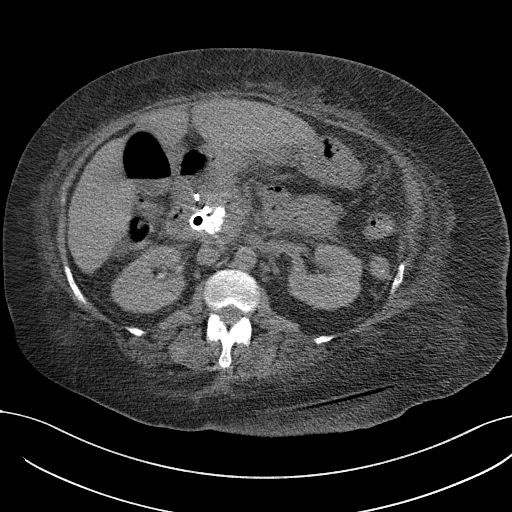
[im 13/42  soft-tissue]
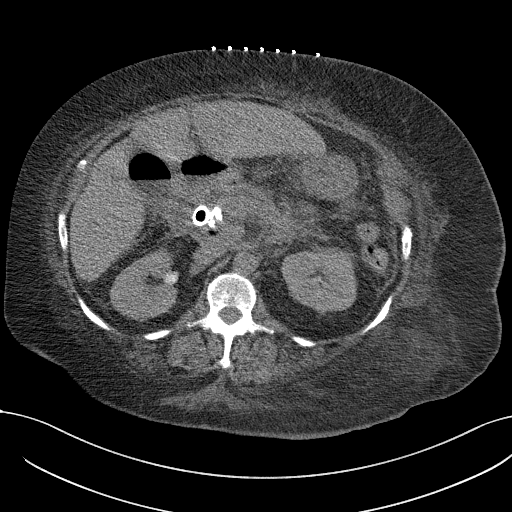
[im 17/42  soft-tissue]
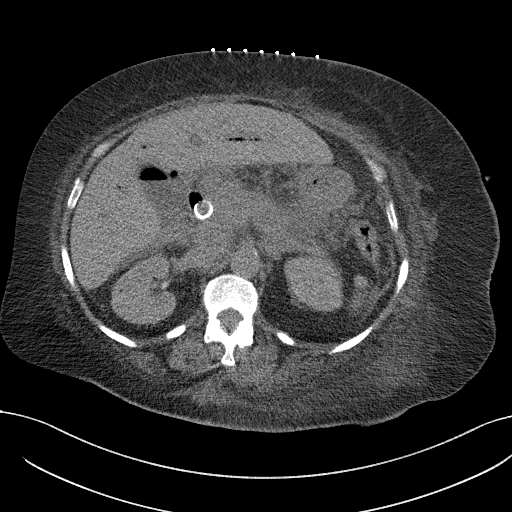
[im 20/42  soft-tissue]
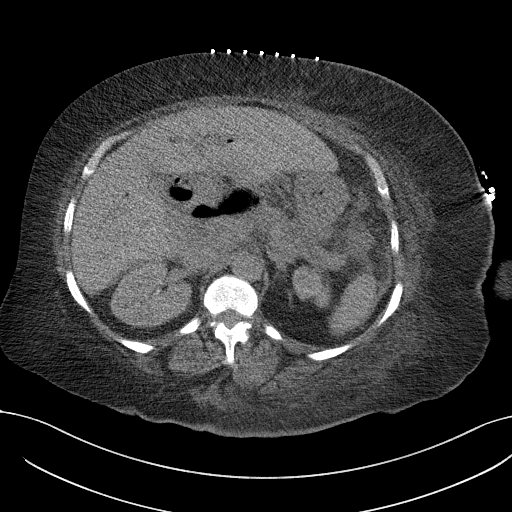
[im 22/42  soft-tissue]
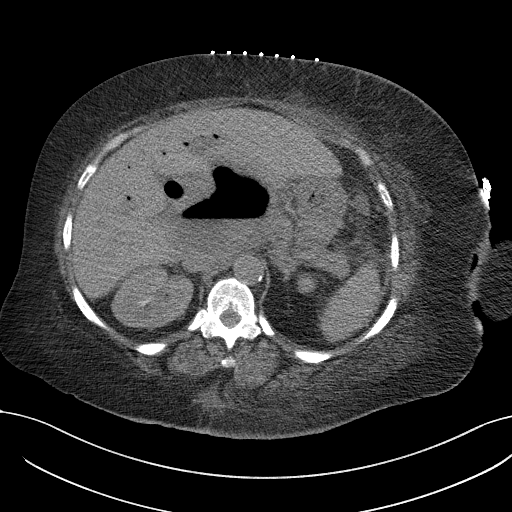
[im 25/42  soft-tissue]
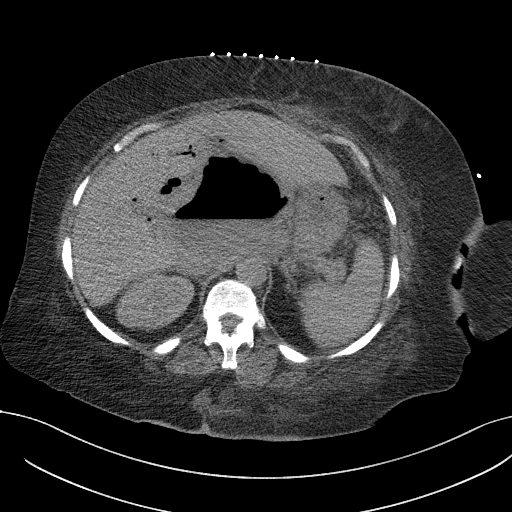
[im 29/42  soft-tissue]
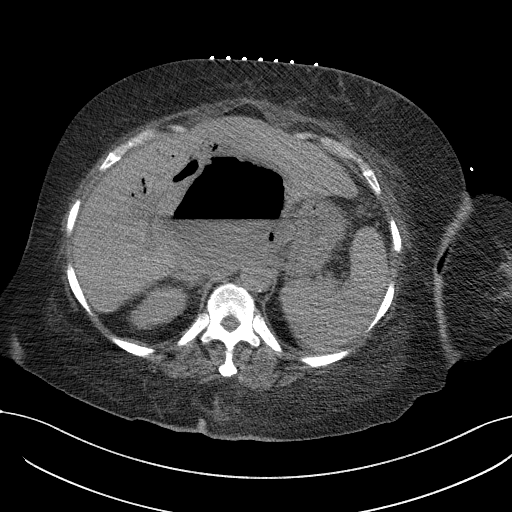
[im 29/42  bone]
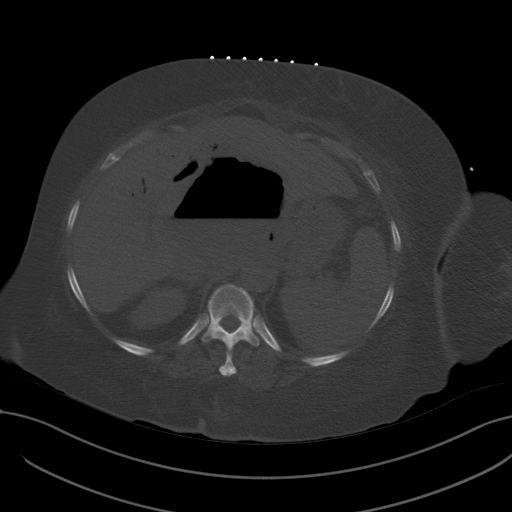
[im 33/42  soft-tissue]
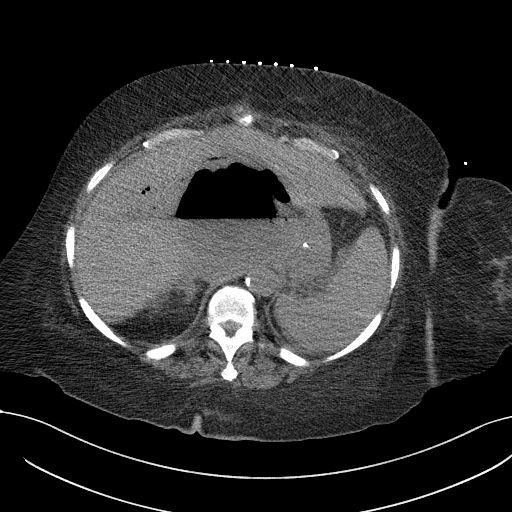
[im 34/42  lung]
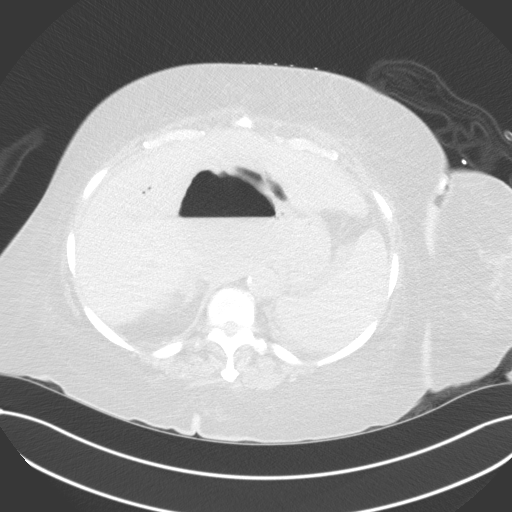
[im 36/42  soft-tissue]
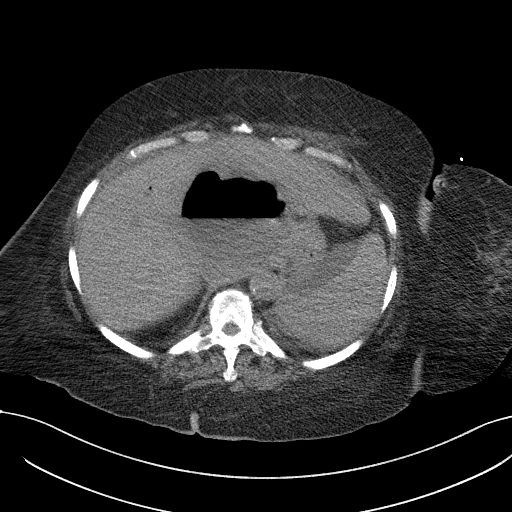
[im 36/42  lung]
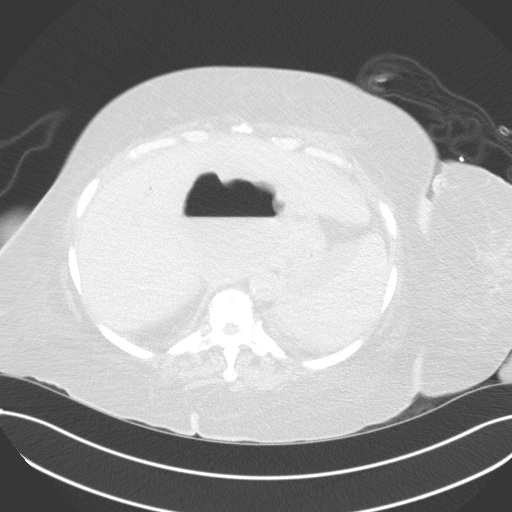
[im 38/42  lung]
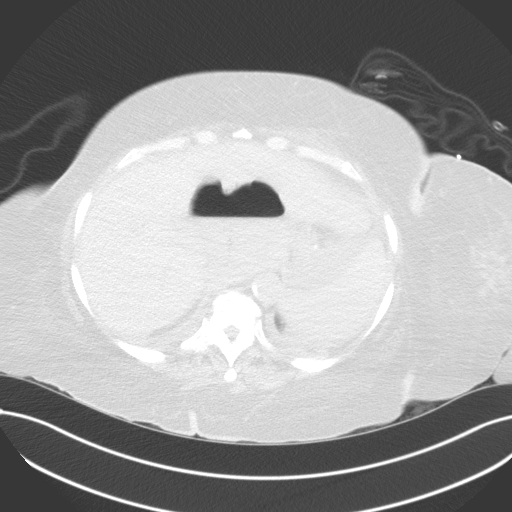
[im 40/42  soft-tissue]
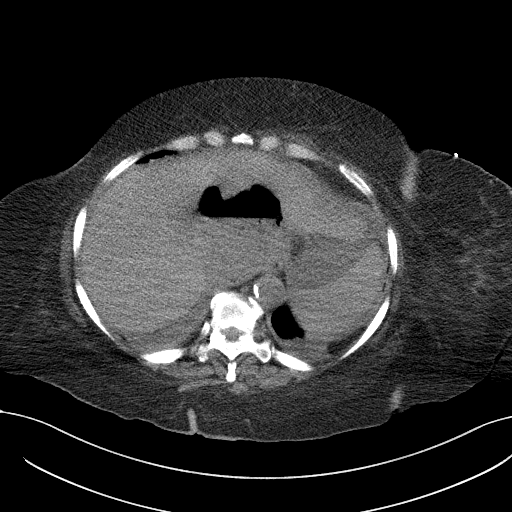
[im 40/42  lung]
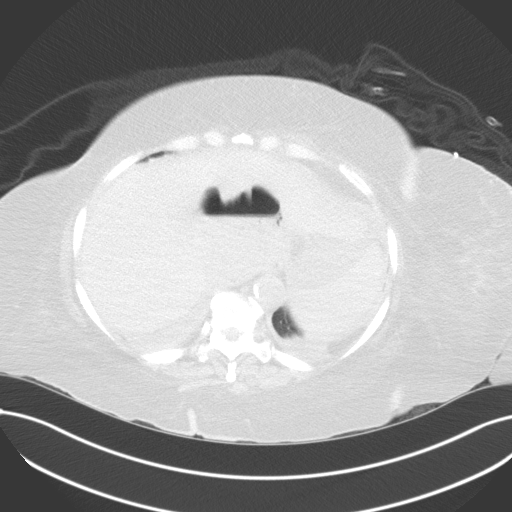

[14 of 32 positions shown; findings below may reference images not displayed]

EXAM:
CT GUIDED DRAINAGE OF ABDOMINAL ABSCESS

ANESTHESIA/SEDATION:
Intravenous Fentanyl [GN] and Versed 2mg were administered as
conscious sedation during continuous monitoring of the patient's
level of consciousness and physiological / cardiorespiratory status
by the radiology RN, with a total moderate sedation time of 20
minutes.

PROCEDURE:
The procedure, risks, benefits, and alternatives were explained to
the patient. Questions regarding the procedure were encouraged and
answered. the patient understands and consents to the procedure.

select axial scans through the abdomen were obtained. the collection
was localized an appropriate skin entry site was determined and
marked.

The operative field was prepped with chlorhexidinein a sterile
fashion, and a sterile drape was applied covering the operative
field. A sterile gown and sterile gloves were used for the
procedure. Local anesthesia was provided with 1% Lidocaine.

Under CT fluoroscopic guidance, 18 gauge needle was advanced into
the collection. Amplatz guidewire advanced easily. CT confirmed
appropriate positioning. Tract dilated with 10 French dilator to
facilitate placement of a 10 French pigtail drain catheter, formed
centrally within the dependent aspect of the collection. 10 mL of
thin purulent material were aspirated, sent for Gram stain and
culture. Catheter secured externally 0 Prolene suture and StatLock
and placed to gravity drain bag. The patient tolerated the procedure
well.

COMPLICATIONS:
None immediate
FINDINGS: Large gas and fluid collection in the porta hepatis was localized.
10 French drain catheter placed as above. 10 mL of purulent aspirate
sent for Gram stain and culture.
IMPRESSION: 1. Technically successful subhepatic abscess drain catheter
placement with CT guidance.

RADIATION DOSE REDUCTION: This exam was performed according to the
departmental dose-optimization program which includes automated
exposure control, adjustment of the mA and/or kV according to
patient size and/or use of iterative reconstruction technique.

## 2022-01-16 MED ORDER — SODIUM CHLORIDE 0.9 % IV SOLN
INTRAVENOUS | Status: AC | PRN
  Administered 2022-01-16: 10 mL/h via INTRAVENOUS

## 2022-01-16 MED ORDER — SODIUM CHLORIDE 0.9% FLUSH
5.0000 mL | Freq: Three times a day (TID) | INTRAVENOUS | Status: DC
  Administered 2022-01-16 – 2022-01-22 (×18): 5 mL

## 2022-01-16 MED ORDER — MIDAZOLAM HCL 2 MG/2ML IJ SOLN
INTRAMUSCULAR | Status: AC
  Filled 2022-01-16: qty 4

## 2022-01-16 MED ORDER — PERFLUTREN LIPID MICROSPHERE
1.0000 mL | INTRAVENOUS | Status: AC | PRN
  Administered 2022-01-16: 2 mL via INTRAVENOUS

## 2022-01-16 MED ORDER — FLUCONAZOLE 100 MG PO TABS
200.0000 mg | ORAL_TABLET | Freq: Every day | ORAL | Status: DC
  Administered 2022-01-16 – 2022-01-17 (×2): 200 mg via ORAL
  Filled 2022-01-16 (×2): qty 2

## 2022-01-16 MED ORDER — FENTANYL CITRATE (PF) 100 MCG/2ML IJ SOLN
INTRAMUSCULAR | Status: AC
  Filled 2022-01-16: qty 2

## 2022-01-16 MED ORDER — POLYETHYLENE GLYCOL 3350 17 G PO PACK
17.0000 g | PACK | Freq: Two times a day (BID) | ORAL | Status: AC
Start: 2022-01-16 — End: 2022-01-18
  Administered 2022-01-16 (×2): 17 g via ORAL
  Filled 2022-01-16 (×4): qty 1

## 2022-01-16 MED ORDER — CHLORHEXIDINE GLUCONATE CLOTH 2 % EX PADS
6.0000 | MEDICATED_PAD | Freq: Every day | CUTANEOUS | Status: DC
  Administered 2022-01-16 – 2022-01-22 (×7): 6 via TOPICAL

## 2022-01-16 MED ORDER — MIDAZOLAM HCL 2 MG/2ML IJ SOLN
INTRAMUSCULAR | Status: AC | PRN
  Administered 2022-01-16 (×2): 1 mg via INTRAVENOUS

## 2022-01-16 MED ORDER — HYDROCODONE-ACETAMINOPHEN 5-325 MG PO TABS
1.0000 | ORAL_TABLET | ORAL | Status: DC | PRN
  Administered 2022-01-17 – 2022-01-19 (×3): 2 via ORAL
  Filled 2022-01-16 (×4): qty 2

## 2022-01-16 MED ORDER — ENSURE ENLIVE PO LIQD
237.0000 mL | Freq: Two times a day (BID) | ORAL | Status: DC
  Administered 2022-01-16 – 2022-01-22 (×10): 237 mL via ORAL

## 2022-01-16 MED ORDER — ADULT MULTIVITAMIN W/MINERALS CH
1.0000 | ORAL_TABLET | Freq: Every day | ORAL | Status: DC
  Administered 2022-01-16 – 2022-01-22 (×7): 1 via ORAL
  Filled 2022-01-16 (×7): qty 1

## 2022-01-16 MED ORDER — FENTANYL CITRATE (PF) 100 MCG/2ML IJ SOLN
INTRAMUSCULAR | Status: AC | PRN
  Administered 2022-01-16: 12.5 ug via INTRAVENOUS
  Administered 2022-01-16 (×2): 25 ug via INTRAVENOUS
  Administered 2022-01-16: 12.5 ug via INTRAVENOUS

## 2022-01-16 MED ORDER — LIP MEDEX EX OINT
TOPICAL_OINTMENT | CUTANEOUS | Status: DC | PRN
  Filled 2022-01-16: qty 7

## 2022-01-16 NOTE — Progress Notes (Signed)
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

## 2022-01-16 NOTE — Progress Notes (Signed)
2D echo attempted, patient going for other testing. Will try later

## 2022-01-16 NOTE — H&P (Signed)
Chief Complaint: Patient was seen in consultation today for hepatic abscess  at the request of Kathie Dike, MD  Referring Physician(s): Kathie Dike, MD  Supervising Physician: Arne Cleveland  Patient Status: [facility] - In-pt  History of Present Illness: Melanie Alvarez is a 66 y.o. 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 Pt presented to ED 01/15/22 c/o abd pain, dyspnea on exertion and constipation. Pt underwent CT abd/pelvis that demonstrated air-fluid collection in central portion of the liver concerning for hepatic abscess. Pt was referred to IR by Dr. Kathie Dike for aspiration with possible drain placement. Procedure was approved by Dr. Vernard Gambles.   Pt last sq heparin was 0505 this morning.  Last PO intake was tea this morning.   CT abd/pelvis 01/15/22:  IMPRESSION: Interval development of 9.8 x 9.2 cm air-fluid collection in the central portion of the liver concerning for hepatic abscess.   There is again noted pancreatic head mass surrounds the common bile duct, and appears to be resulting in occlusion of the superior mesenteric and splenic veins as noted on prior exam. Severe pancreatic ductal dilatation is noted.   Hepatic pneumobilia is noted secondary to common bile duct stent.   1.4 cm partially enhancing exophytic mass is seen arising from lower pole right kidney concerning for possible neoplasm or malignancy.   Mildly enlarged periaortic adenopathy is noted concerning for metastatic disease.   Mild ascites is noted in the pelvis.  Past Medical History:  Diagnosis Date   Cancer ([place])    Cirrhosis ([place])    Diabetes mellitus 2012   Diverticulosis    Gallbladder sludge    Hepatitis 2010   history of Hepatitis C   Hepatitis C    Hypertension 2011   Obesity     Past Surgical History:  Procedure Laterality Date   BILIARY BRUSHING  09/22/2019    Procedure: BILIARY BRUSHING;  Surgeon: Irving Copas., MD;  Location: [facility];  Service: Gastroenterology;;   BILIARY DILATION  09/22/2019   Procedure: BILIARY DILATION;  Surgeon: Irving Copas., MD;  Location: [facility];  Service: Gastroenterology;;   BILIARY STENT PLACEMENT N/A 09/22/2019   Procedure: BILIARY STENT PLACEMENT;  Surgeon: Irving Copas., MD;  Location: [facility];  Service: Gastroenterology;  Laterality: N/A;   COLONOSCOPY  07/24/2011   Procedure: COLONOSCOPY;  Surgeon: Landry Dyke, MD;  Location: [facility];  Service: Endoscopy;  Laterality: N/A;   Dental Surgery Tooth Extraction     ERCP N/A 09/22/2019   Procedure: ENDOSCOPIC RETROGRADE CHOLANGIOPANCREATOGRAPHY (ERCP);  Surgeon: Irving Copas., MD;  Location: [facility];  Service: Gastroenterology;  Laterality: N/A;   ESOPHAGOGASTRODUODENOSCOPY (EGD) WITH PROPOFOL N/A 09/22/2019   Procedure: ESOPHAGOGASTRODUODENOSCOPY (EGD) WITH PROPOFOL;  Surgeon: Rush Landmark Telford Nab., MD;  Location: [facility];  Service: Gastroenterology;  Laterality: N/A;   ESOPHAGOGASTRODUODENOSCOPY (EGD) WITH PROPOFOL N/A 09/25/2019   Procedure: ESOPHAGOGASTRODUODENOSCOPY (EGD) WITH PROPOFOL;  Surgeon: Rush Landmark Telford Nab., MD;  Location: [facility];  Service: Gastroenterology;  Laterality: N/A;   ESOPHAGOGASTRODUODENOSCOPY (EGD) WITH PROPOFOL N/A 01/19/2020   Procedure: ESOPHAGOGASTRODUODENOSCOPY (EGD) WITH PROPOFOL;  Surgeon: Rush Landmark Telford Nab., MD;  Location: [place];  Service: Gastroenterology;  Laterality: N/A;   EUS N/A 09/22/2019   Procedure: UPPER ENDOSCOPIC ULTRASOUND (EUS) RADIAL;  Surgeon: Irving Copas., MD;  Location: [facility];  Service: Gastroenterology;  Laterality: N/A;   EUS N/A 01/19/2020   Procedure: UPPER ENDOSCOPIC ULTRASOUND (EUS) RADIAL;  Surgeon: Irving Copas., MD;  Location: [place];  Service: Gastroenterology;  Laterality: N/A;   FIDUCIAL MARKER  PLACEMENT N/A 01/19/2020   Procedure: FIDUCIAL MARKER PLACEMENT;  Surgeon: Irving Copas., MD;  Location: [place];  Service: Gastroenterology;  Laterality: N/A;   FINE NEEDLE ASPIRATION  09/22/2019   Procedure: FINE NEEDLE ASPIRATION (FNA) LINEAR;  Surgeon: Irving Copas., MD;  Location: [facility];  Service: Gastroenterology;;   FINE NEEDLE ASPIRATION  09/25/2019   Procedure: FINE NEEDLE ASPIRATION (FNA) RADIAL;  Surgeon: Irving Copas., MD;  Location: [facility];  Service: Gastroenterology;;   HEMOSTASIS CLIP PLACEMENT  09/25/2019   Procedure: HEMOSTASIS CLIP PLACEMENT;  Surgeon: Irving Copas., MD;  Location: [facility];  Service: Gastroenterology;;   IR IMAGING GUIDED PORT INSERTION  09/24/2019   IR IMAGING GUIDED PORT INSERTION  12/31/2021   IR REMOVAL TUN ACCESS W/ PORT W/O FL MOD SED  06/16/2020   SPHINCTEROTOMY  09/22/2019   Procedure: Joan Mayans;  Surgeon: Mansouraty, Telford Nab., MD;  Location: [facility];  Service: Gastroenterology;;   UPPER ESOPHAGEAL ENDOSCOPIC ULTRASOUND (EUS) N/A 09/25/2019   Procedure: UPPER ESOPHAGEAL ENDOSCOPIC ULTRASOUND (EUS);  Surgeon: Irving Copas., MD;  Location: [facility];  Service: Gastroenterology;  Laterality: N/A;    Allergies: Lisinopril  Medications: Prior to Admission medications   Medication Sig Start Date End Date Taking? Authorizing Provider  amLODipine (NORVASC) 10 MG tablet Take 1 tablet (10 mg total) by mouth at bedtime. 11/27/21  Yes Nche, Charlene Brooke, NP  APIXABAN Arne Cleveland) VTE STARTER PACK ('10MG'$  AND '5MG'$ ) Take as directed on package: start with two-'5mg'$  tablets twice daily for 7 days. On day 8, switch to one-'5mg'$  tablet twice daily. Patient taking differently: Take 5 mg by mouth 2 (two) times daily. 12/24/21  Yes Volanda Napoleon, MD  atorvastatin (LIPITOR) 20 MG tablet Take 1 tablet (20 mg total) by mouth at bedtime. 01/25/21  Yes Nche, Charlene Brooke, NP  CREON 36000-114000 units CPEP  capsule TAKE 2 CAPSULES BY MOUTH THREE TIMES DAILY BEFORE MEAL(S) Patient taking differently: Take 72,000 Units by mouth 3 (three) times daily before meals. 11/19/21  Yes Volanda Napoleon, MD  feeding supplement (BOOST HIGH PROTEIN) LIQD Take 2 Containers by mouth in the morning.   Yes [provider]  fentaNYL (DURAGESIC) 25 MCG/HR Place 1 patch onto the skin every 3 (three) days. 01/02/22  Yes Volanda Napoleon, MD  fluticasone (FLONASE) 50 MCG/ACT nasal spray Place 2 sprays into both nostrils daily. Patient taking differently: Place 2 sprays into both nostrils daily as needed for allergies or rhinitis. 05/14/21  Yes Tower, Wynelle Fanny, MD  glipiZIDE (GLUCOTROL XL) 5 MG 24 hr tablet Take 1 tablet (5 mg total) by mouth daily with breakfast. 09/14/21  Yes Nche, Charlene Brooke, NP  HYDROmorphone (DILAUDID) 4 MG tablet Take 1 tablet (4 mg total) by mouth every 4 (four) hours as needed for severe pain. 01/02/22  Yes Volanda Napoleon, MD  lidocaine-prilocaine (EMLA) cream Apply to affected area once Patient taking differently: 1 application. as needed (for port access). 12/26/21  Yes Volanda Napoleon, MD  metFORMIN (GLUCOPHAGE XR) 750 MG 24 hr tablet Take 1 tablet (750 mg total) by mouth daily with breakfast. 09/14/21  Yes Nche, Charlene Brooke, NP  olmesartan (BENICAR) 5 MG tablet Take 1 tablet (5 mg total) by mouth daily. 01/25/21  Yes Nche, Charlene Brooke, NP  ondansetron (ZOFRAN) 8 MG tablet Take 1 tablet (8 mg total) by mouth 2 (two) times  daily as needed (Nausea or vomiting). Patient taking differently: Take 8 mg by mouth 2 (two) times daily as needed for nausea or vomiting. 12/26/21  Yes Ennever, Rudell Cobb, MD  pantoprazole (PROTONIX) 40 MG tablet Take 1 tablet (40 mg total) by mouth daily. 12/24/21  Yes Ennever, Rudell Cobb, MD  prochlorperazine (COMPAZINE) 10 MG tablet Take 1 tablet (10 mg total) by mouth every 6 (six) hours as needed (Nausea or vomiting). Patient taking differently: Take 10 mg by mouth every 6  (six) hours as needed for nausea or vomiting. 12/26/21  Yes Ennever, Rudell Cobb, MD  TYLENOL 8 HOUR 650 MG CR tablet Take 650-1,300 mg by mouth every 8 (eight) hours as needed for pain.   Yes [provider]  glucose blood test strip Test blood glucose once daily. One Touch Verio test strips 09/28/19   Nche, Charlene Brooke, NP  metoprolol succinate (TOPROL-XL) 25 MG 24 hr tablet TAKE 1 TABLET BY MOUTH ONCE DAILY Patient not taking: Reported on 01/15/2022 01/25/21   Nche, Charlene Brooke, NP  pantoprazole (PROTONIX) 40 MG tablet Take 1 tablet (40 mg total) by mouth 2 (two) times daily for 14 days. Patient not taking: Reported on 01/15/2022 12/04/21 12/18/21  Nche, Charlene Brooke, NP  potassium chloride (KLOR-CON M) 20 MEQ tablet Take 1 tablet (20 mEq total) by mouth 2 (two) times daily. Patient not taking: Reported on 01/15/2022 11/27/21   Nche, Charlene Brooke, NP     Family History  Problem Relation Age of Onset   Diabetes Sister    Cancer Maternal Grandmother        leukemia    Diabetes Maternal Grandfather    Diabetes Paternal Grandfather    Diabetes Paternal Grandmother    Hypertension Sister    Hypertension Brother    Anesthesia problems Neg Hx     Social History   Socioeconomic History   Marital status: Married    Spouse name: Not on file   Number of children: Not on file   Years of education: Not on file   Highest education level: Not on file  Occupational History   Not on file  Tobacco Use   Smoking status: Former    Packs/day: 0.25    Types: Cigarettes    Quit date: 09/20/2019    Years since quitting: 2.3   Smokeless tobacco: Never  Vaping Use   Vaping Use: Never used  Substance and Sexual Activity   Alcohol use: Not Currently    Comment: quit 2022   Drug use: No    Comment: previously    Sexual activity: Yes    Partners: Male    Birth control/protection: Post-menopausal  Other Topics Concern   Not on file  Social History Narrative   Previous [facility] pt.  Last MD  was Dr. Delman Cheadle, seen 12/2011      Works part time in [employer] with husband   Social Determinants of Health   Financial Resource Strain: Not on [employer] Insecurity: Not on file  Transportation Needs: Not on file  Physical Activity: Not on file  Stress: Not on file  Social Connections: Not on file    Review of Systems: A 12 point ROS discussed and pertinent positives are indicated in the HPI above.  All other systems are negative.  Review of Systems  Constitutional:  Negative for fever.  Respiratory:  Positive for shortness of breath.   Gastrointestinal:  Positive for abdominal pain  and nausea.   Vital Signs: BP 128/70 (BP Location: Left Arm)   Pulse 91   Temp 97.8 F (36.6 C) (Oral)   Resp 18   Ht 5' 3.5" (1.613 m)   Wt 189 lb (85.7 kg)   SpO2 95%   BMI 32.95 kg/m   Physical Exam HENT:     Head: Normocephalic and atraumatic.  Eyes:     Extraocular Movements: Extraocular movements intact.     Pupils: Pupils are equal, round, and reactive to light.  Cardiovascular:     Rate and Rhythm: Normal rate and regular rhythm.     Pulses: Normal pulses.  Pulmonary:     Effort: Pulmonary effort is normal. No respiratory distress.  Abdominal:     General: Bowel sounds are normal.  Skin:    General: Skin is warm and dry.     Comments: PAC to R upper chest  Neurological:     Mental Status: She is alert and oriented to person, place, and time.  Psychiatric:        Mood and Affect: Mood normal.        Behavior: Behavior normal.        Thought Content: Thought content normal.        Judgment: Judgment normal.    Imaging: CT Angio Chest PE W and/or Wo Contrast  Result Date: 01/15/2022 CLINICAL DATA:  Shortness of breath with sensation of "feeling funny" over the last couple days. History of pancreatic cancer. Clinical concern for pulmonary embolism. EXAM: CT ANGIOGRAPHY CHEST WITH CONTRAST TECHNIQUE: Multidetector CT imaging of the chest was  performed using the standard protocol during bolus administration of intravenous contrast. Multiplanar CT image reconstructions and MIPs were obtained to evaluate the vascular anatomy. RADIATION DOSE REDUCTION: This exam was performed according to the departmental dose-optimization program which includes automated exposure control, adjustment of the mA and/or kV according to patient size and/or use of iterative reconstruction technique. CONTRAST:  52m OMNIPAQUE IOHEXOL 350 MG/ML SOLN COMPARISON:  Chest radiographs 01/15/2022. CTs of the chest, abdomen and pelvis 12/21/2020 and CT of the abdomen and pelvis 12/13/2021. FINDINGS: Cardiovascular: The pulmonary arteries are well opacified with contrast to the level of the subsegmental branches. There is no evidence of acute pulmonary embolism. Right IJ Port-A-Cath extends into the right atrium. There is atherosclerosis of the aorta, great vessels and coronary arteries. The heart is mildly enlarged. No significant pericardial fluid. Mediastinum/Nodes: No enlarged mediastinal, hilar or axillary lymph nodes. Mildly prominent mediastinal lymph nodes are likely reactive. The thyroid gland, trachea and esophagus demonstrate no significant findings. Lungs/Pleura: New small left greater than right pleural effusions. Mild dependent atelectasis in both lungs. No confluent airspace opacity, suspicious pulmonary nodule or pneumothorax. Upper abdomen: Interval development of a large collection of air and gas within the porta hepatis/gastrohepatic ligament, measuring approximately 10.0 x 7.4 x 7.8 cm. This lies superior to metallic biliary stent. There is increased pneumobilia. No free intraperitoneal air or other extraluminal fluid collection identified in the upper abdomen. Musculoskeletal/Chest wall: Chronic glenohumeral arthropathy, right greater than left. Mild thoracic spondylosis. No acute osseous findings or chest wall masses. Review of the MIP images confirms the above  findings. IMPRESSION: 1. No evidence of acute pulmonary embolism. 2. New small bilateral pleural effusions with mild atelectasis at both lung bases. 3. New large collection of gas and air within the porta hepatis/gastrohepatic ligament compared with abdominal CT of last month. This could reflect an abscess or contained bowel perforation. Given venous thrombosis on  prior abdominal CT, this could reflect cavitation of an infarct. Recommend further evaluation with dedicated CT of the abdomen and pelvis to include enteric contrast. 4. These results were called by telephone at the time of interpretation on 01/15/2022 at 1:11 pm to provider ADAM CURATOLO , who verbally acknowledged these results. Electronically Signed   By: Richardean Sale M.D.   On: 01/15/2022 13:12   CT ABDOMEN PELVIS W CONTRAST  Result Date: 01/15/2022 CLINICAL DATA:  Acute generalized abdominal pain. History of pancreatic cancer. EXAM: CT ABDOMEN AND PELVIS WITH CONTRAST TECHNIQUE: Multidetector CT imaging of the abdomen and pelvis was performed using the standard protocol following bolus administration of intravenous contrast. RADIATION DOSE REDUCTION: This exam was performed according to the departmental dose-optimization program which includes automated exposure control, adjustment of the mA and/or kV according to patient size and/or use of iterative reconstruction technique. CONTRAST:  140m OMNIPAQUE IOHEXOL 300 MG/ML  SOLN COMPARISON:  December 13, 2021. FINDINGS: Lower chest: Minimal bilateral posterior basilar subsegmental atelectasis is noted. Hepatobiliary: Hepatic pneumobilia is noted most likely due to common bile duct stent. There is interval development of 9.8 x 9.2 cm air-fluid collection in the central portion of the liver concerning for hepatic abscess. No gallstones are noted, although pneumobilia has extended into the gallbladder lumen. Pancreas: There is again noted pancreatic head mass that surrounds the common bile duct, and  appears to be resulting in occlusion of the superior mesenteric and splenic veins. Severe pancreatic ductal dilatation is noted secondary to this tumor. Spleen: Normal in size without focal abnormality. Adrenals/Urinary Tract: Adrenal glands are unremarkable. No hydronephrosis or renal obstruction is noted. No renal or ureteral calculi are noted. 1.4 cm partially enhancing exophytic mass is seen arising from lower pole of right kidney which was present on prior exam and concerning for possible neoplasm. Bladder is unremarkable. Stomach/Bowel: Stomach is within normal limits. Appendix appears normal. No evidence of bowel wall thickening, distention, or inflammatory changes. Vascular/Lymphatic: Aortic atherosclerosis. Mildly enlarged periaortic adenopathy is noted concerning for metastatic disease. Reproductive: Uterus and bilateral adnexa are unremarkable. Other: No hernia is noted.  Mild ascites is noted in the pelvis. Musculoskeletal: No acute or significant osseous findings. IMPRESSION: Interval development of 9.8 x 9.2 cm air-fluid collection in the central portion of the liver concerning for hepatic abscess. There is again noted pancreatic head mass surrounds the common bile duct, and appears to be resulting in occlusion of the superior mesenteric and splenic veins as noted on prior exam. Severe pancreatic ductal dilatation is noted. Hepatic pneumobilia is noted secondary to common bile duct stent. 1.4 cm partially enhancing exophytic mass is seen arising from lower pole right kidney concerning for possible neoplasm or malignancy. Mildly enlarged periaortic adenopathy is noted concerning for metastatic disease. Mild ascites is noted in the pelvis. Aortic Atherosclerosis (ICD10-I70.0). Electronically Signed   By: JMarijo ConceptionM.D.   On: 01/15/2022 16:02   DG Chest Portable 1 View  Result Date: 01/15/2022 CLINICAL DATA:  Short of breath EXAM: PORTABLE CHEST 1 VIEW COMPARISON:  09/19/2014 FINDINGS: Heart  size mildly enlarged. Negative for heart failure or edema. Mild left lower lobe atelectasis/infiltrate. Right lung clear. No effusion. Port-A-Cath tip in the lower SVC at the cavoatrial junction Degenerative change in both shoulders right greater than left IMPRESSION: Mild left lower lobe airspace disease likely atelectasis Port-A-Cath tip at the cavoatrial junction. Electronically Signed   By: CFranchot GalloM.D.   On: 01/15/2022 11:15   MM 3D SCREEN BREAST  BILATERAL  Result Date: 12/21/2021 CLINICAL DATA:  Screening. EXAM: DIGITAL SCREENING BILATERAL MAMMOGRAM WITH TOMOSYNTHESIS AND CAD TECHNIQUE: Bilateral screening digital craniocaudal and mediolateral oblique mammograms were obtained. Bilateral screening digital breast tomosynthesis was performed. The images were evaluated with computer-aided detection. COMPARISON:  Previous exam(s). ACR Breast Density Category b: There are scattered areas of fibroglandular density. FINDINGS: There are no findings suspicious for malignancy. IMPRESSION: No mammographic evidence of malignancy. A result letter of this screening mammogram will be mailed directly to the patient. RECOMMENDATION: Screening mammogram in one year. (Code:[id number]-[id number]) BI-RADS CATEGORY  1: Negative. Electronically Signed   By: Evangeline Dakin M.D.   On: 12/21/2021 13:17   IR IMAGING GUIDED PORT INSERTION  Result Date: 12/31/2021 INDICATION: Pancreatic malignancy EXAM: IMPLANTED PORT A CATH PLACEMENT WITH ULTRASOUND AND FLUOROSCOPIC GUIDANCE MEDICATIONS: None ANESTHESIA/SEDATION: Moderate (conscious) sedation was employed during this procedure. A total of Versed 2 mg and Fentanyl 100 mcg was administered intravenously by the radiology nurse. Total intra-service moderate Sedation Time: 19 minutes. The patient's level of consciousness and vital signs were monitored continuously by radiology nursing throughout the procedure under my direct supervision. FLUOROSCOPY: Radiation Exposure Index (as provided by  the fluoroscopic device): 4 mGy Kerma COMPLICATIONS: None immediate. PROCEDURE: The procedure, risks, benefits, and alternatives were explained to the patient. Questions regarding the procedure were encouraged and answered. The patient understands and consents to the procedure. A timeout was performed prior to the initiation of the procedure. Patient positioned supine on the angiography table. Right neck and anterior upper chest prepped and draped in the usual sterile fashion. All elements of maximal sterile barrier were utilized including, cap, mask, sterile gown, sterile gloves, large sterile drape, hand scrubbing and 2% Chlorhexidine for skin cleaning. The right internal jugular vein was evaluated with ultrasound and shown to be patent. A permanent ultrasound image was obtained and placed in the patient's medical record. Local anesthesia was provided with 1% lidocaine with epinephrine. Using sterile gel and a sterile probe cover, the right internal jugular vein was entered with a 21 ga needle during real time ultrasound guidance. 0.018 inch guidewire placed and 21 ga needle exchanged for transitional dilator set. Utilizing fluoroscopy, 0.035 inch guidewire advanced through the needle without difficulty. Attention then turned to the right anterior upper chest. Following local lidocaine administration, a port pocket was created. The catheter was connected to the port and brought from the pocket to the venotomy site through a subcutaneous tunnel. The catheter was cut to size and inserted through the peel-away sheath. The catheter tip was positioned at the cavoatrial junction using fluoroscopic guidance. The port aspirated and flushed well. The port pocket was closed with deep and superficial absorbable suture. The port pocket incision and venotomy sites were also sealed with Dermabond. IMPRESSION: Successful placement of a right internal jugular approach power injectable Port-A-Cath. The catheter is ready for  immediate use. Electronically Signed   By: Miachel Roux M.D.   On: 12/31/2021 16:19    Labs:  CBC: Recent Labs    01/02/22 0840 01/09/22 0821 01/15/22 1116 01/16/22 0500  WBC 28.2* 7.6 9.1 9.9  HGB 9.2* 7.4* 11.8* 11.0*  HCT 28.1* 22.7* 36.5 34.5*  PLT 489* 298 538* 422*    COAGS: No results for input(s): INR, APTT in the last 8760 hours.  BMP: Recent Labs    01/02/22 0840 01/09/22 0821 01/15/22 1116 01/16/22 0500  NA 125* 123* 128* 131*  K 3.9 4.8 4.1 3.9  CL 90* 87* 92* 97*  CO2  $'28 28 26 26  'p$ GLUCOSE 136* 208* 283* 100*  BUN '8 12 13 9  '$ CALCIUM 9.0 8.7* 8.1* 8.0*  CREATININE 0.55 0.68 0.67 0.47  GFRNONAA >60 >60 >60 >60    LIVER FUNCTION TESTS: Recent Labs    01/02/22 0840 01/09/22 0821 01/15/22 1116 01/16/22 0500  BILITOT 0.6 0.8 0.8 0.7  AST 51* 46* 41 39  ALT 35 43 36 32  ALKPHOS 92 128* 105 103  PROT 6.9 6.7 6.9 6.5  ALBUMIN 3.0* 2.8* 2.2* 2.0*    TUMOR MARKERS: No results for input(s): AFPTM, CEA, CA199, CHROMGRNA in the last 8760 hours.  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 Pt presented to ED 01/15/22 c/o abd pain, dyspnea on exertion and constipation. Pt underwent CT abd/pelvis that demonstrated air-fluid collection in central portion of the liver concerning for hepatic abscess. Pt was referred to IR by Dr. Kathie Dike for aspiration with possible drain placement. Procedure was approved by Dr. Vernard Gambles.   Pt last sq heparin was 0505 this morning.  Last PO intake was tea this morning.   CT abd/pelvis 01/15/22:  IMPRESSION: Interval development of 9.8 x 9.2 cm air-fluid collection in the central portion of the liver concerning for hepatic abscess.   There is again noted pancreatic head mass surrounds the common bile duct, and appears to be resulting in occlusion of the superior mesenteric and splenic veins as noted on prior exam.  Severe pancreatic ductal dilatation is noted.   Hepatic pneumobilia is noted secondary to common bile duct stent.   1.4 cm partially enhancing exophytic mass is seen arising from lower pole right kidney concerning for possible neoplasm or malignancy.   Mildly enlarged periaortic adenopathy is noted concerning for metastatic disease.   Mild ascites is noted in the pelvis.   Risks and benefits of hepatic abscess aspiration/drain placement was discussed with the patient and/or patient's family including, but not limited to bleeding, infection, damage to adjacent structures. All of the questions were answered and there is agreement to proceed.  Consent signed and in chart.   Thank you for this interesting consult.  I greatly enjoyed meeting SHELLA LAHMAN and look forward to participating in their care.  A copy of this report was sent to the requesting provider on this date.  Electronically Signed: Tyson Alias, NP 01/16/2022, 9:42 AM   I spent a total of 20 minutes in face to face in clinical consultation, greater than 50% of which was counseling/coordinating care for hepatic abscess.

## 2022-01-16 NOTE — Progress Notes (Signed)
Initial Nutrition Assessment  DOCUMENTATION CODES:   Obesity unspecified  INTERVENTION:   -Ensure Plus High Protein po BID, each supplement provides 350 kcal and 20 grams of protein.   -Multivitamin with minerals daily   NUTRITION DIAGNOSIS:   Increased nutrient needs related to cancer and cancer related treatments as evidenced by estimated needs.  GOAL:   Patient will meet greater than or equal to 90% of their needs  MONITOR:   PO intake, Supplement acceptance, Labs, Weight trends, I & O's  REASON FOR ASSESSMENT:   Malnutrition Screening Tool    ASSESSMENT:   66 y.o. 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 Admitted for abdominal pain and dyspnea.  Patient unavailable at time of visit. Having procedure in IR.  Per chart review, pt is undergoing chemotherapy for pancreatic cancer. Last treatment 5/18. Now with new hepatic abscess.  NPO for procedure. Pt reporting poor PO and increased weakness at admission.  Will order protein supplements once diet is advanced.  Per weight records, pt has lost 10 lbs since 1/17 (5% wt loss x 4.5 months, insignificant for time frame).  Medications: Creon, Miralax  Labs reviewed:  CBGs: 112  NUTRITION - FOCUSED PHYSICAL EXAM:  Pt not available.  Diet Order:   Diet Order             Diet NPO time specified  Diet effective now                   EDUCATION NEEDS:   Not appropriate for education at this time  Skin:  Skin Assessment: Reviewed RN Assessment  Last BM:  5/31 -type 4  Height:   Ht Readings from Last 1 Encounters:  01/15/22 5' 3.5" (1.613 m)    Weight:   Wt Readings from Last 1 Encounters:  01/15/22 85.7 kg    BMI:  Body mass index is 32.95  kg/m.  Estimated Nutritional Needs:   Kcal:  1650-1850  Protein:  80-95g  Fluid:  1.9L/day   Clayton Bibles, MS, RD, LDN Inpatient Clinical Dietitian Contact information available via Amion

## 2022-01-16 NOTE — Progress Notes (Signed)
  Echocardiogram 2D Echocardiogram has been performed.  Melanie Alvarez 01/16/2022, 1:40 PM

## 2022-01-16 NOTE — Consult Note (Signed)
Melanie Alvarez is well-known to me.  She is 66 year old Afro-American female.  She has a relapse of her pancreatic cancer.  We have not yet started her on chemotherapy.  She has been in the office because of dehydration and anemia.  She called the office yesterday.  She has some shortness of breath.  We had to go to the emergency room.  She had a CT angiogram which was negative for pulmonary embolism.  However, they noted a abscess in her liver.  She subsequently had a CT of the abdomen pelvis.  This confirmed a large fluid collection in the liver.  She says she is feeling little bit better.  She is on IV fluids right now.  Her blood sugars have been quite high.  She has thrush.  When she came in, her glucose was 283.  Her LFTs were actually normal.  Her sodium was 128 with a potassium of 4.1.  Chloride was 92.  Her white cell count was 9.1.  Hemoglobin 11.8.  Platelet count 538,000.  This morning, her white count is 9.9.  Hemoglobin 11.  Platelet count 422,000.  She clearly is going need to have this abscess or fluid collection drained.  I will know if Interventional Radiology has been called.  She is on some IV fluid for now.  She is on IV antibiotics.  Again I think will be incredibly helpful to get this abscess drain and send off the fluid for cultures and cytology.  I am not sure why she would have an abscess in her liver.  By her last scans, she did not have any obvious metastatic disease to her liver.  Again, we have not been able to treat her for the recurrent pancreatic cancer.  On her physical exam, her vital signs are temperature of 98.8.  Pulse 104.  Blood pressure 115/63.  Her head and neck exam shows no ocular or oral lesions.  She has no palpable cervical or supraclavicular lymph nodes.  Lungs are clear bilaterally.  Cardiac exam regular rate and rhythm.  There are no murmurs, rubs or bruits.  Abdomen is soft.  There may be a little bit of distention.  Bowel sounds are present.  There is  no obvious fluid wave.  There is no tenderness in the right upper quadrant.  There is no hepatomegaly.  Extremity shows no clubbing, cyanosis or edema.  Neurological exam shows no focal neurological deficits.  Melanie Alvarez is a 66 year old Afro-American female.  She has recurrent pancreatic cancer.  We have not yet treated this.  The problem right now is this hepatic lesion.  I assume it looks like an abscess.  I had believe this is going to have to be drained.  As such, I would think Interventional Radiology would be involved.  They can drain the abscess.  If this was infected, I am just surprised that she has not had any fever or pain.  This is some type of abscess related to her tumor, then we should be able to see malignant cells in the fluid that is drawn out.  I know that she will get incredible care from all the staff up on [address].  Lattie Haw, MD  Psalm 34:8

## 2022-01-16 NOTE — Consult Note (Addendum)
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 New presence within 1 month, in setting recurrent pancreatic cancer, I favor necrotic tumor burden vs infectious complication. I query with the dual blood supply of the liver we might not see hepatic infarct like this   She presented without sepsis or hemodynamic disturbance but have to r/o infection  Plan: Would get bacterial, fungal, afb culture Would also get cytology F/u blood cx result  Continue ceftriaxone/flagyl (started within 24 hours prior to IR sampling) Discussed with primary team   I spent 75 minute reviewing data/chart, and coordinating care and >50% direct face to face time providing counseling/discussing diagnostics/treatment plan with patient   ---------- Addendum.  Added cytology order Spoke with nursing about getting fluid from drain and send for cytology   ------------------------------------------------ Principal Problem:   Hepatic lesion Active Problems:   Hypertension   DM (diabetes mellitus) ([place])   Mixed hyperlipidemia   Hyponatremia   Malignant neoplasm of head of pancreas (HCC)   Pancreatic insufficiency   Chronic pain syndrome   Portal vein thrombosis   Dyspnea   Constipation   Hepatic abscess    HPI: Melanie Alvarez is a 66 y.o. female dm2, hep c s/p treatment, cirrhosis, etoh abuse in remission, pancreatic cancer dx'ed 09/2019 with pancreatic insufficiency, recent thrombotic disease on  anticoagulation admitted for a new hepatic lesion with air fluid level  Patient has had abdominal pain/decreased appetite with her recurrent pancreatic cancer. She continues to have this along with falure to thrive and for the week prior to admission dyspnea that is worse with laying down  There are associated nausea, constipation. No vomiting, fever, chill, rash, joint, back pain, visual disturbance, headache    Oncologic hx (pancreatic adenocx), per dr Wynonia Musty note on 5/08 -dx'ed 09/2019 (brushing cytology of pancreatic head); locally advanced, unresectable, M0 by ct scan 09/2019 -Treatment Initial chemoradiation therapy. Neoadjuvant FOLFIRINOX 10/13/19-11/2019 four cycles, followed by SBRT by 02/24/20 Recurrent disease by CT 12/10/21 progressive tumor of pancreatic head along with SMV, splenic vein, and portal vein thrombosis Second course chemotherapy Paclitaxel/gemcitabine starting 01/01/2022  -Portacath placed 5/15 by IR  In the ED: Afebrile; hds No leukocytosis but a thrombocytosis Ct chest pe no pe but bilateral small pleural effusion Ct abd pelv showed new hepatic lesion with air fluid level Empiric abx started for concern of hepatic abscess Bcx obtained  Patient underwent IR sampling of the hepatic lesion today     Family History  Problem Relation Age of Onset   Diabetes Sister    Cancer Maternal Grandmother        leukemia    Diabetes Maternal Grandfather    Diabetes Paternal Grandfather    Diabetes Paternal Grandmother    Hypertension Sister    Hypertension Brother    Anesthesia problems Neg Hx     Social History   Tobacco Use   Smoking status: Former    Packs/day: 0.25    Types: Cigarettes  Quit date: 09/20/2019    Years since quitting: 2.3   Smokeless tobacco: Never  Vaping Use   Vaping Use: Never used  Substance Use Topics   Alcohol use: Not Currently    Comment: quit 2022   Drug use: No    Comment: previously     Allergies  Allergen Reactions    Lisinopril Swelling and Other (See Comments)    Facial and upper lip    Review of Systems: ROS All Other ROS was negative, except mentioned above   Past Medical History:  Diagnosis Date   Cancer ([place])    Cirrhosis ([place])    Diabetes mellitus 2012   Diverticulosis    Gallbladder sludge    Hepatitis 2010   history of Hepatitis C   Hepatitis C    Hypertension 2011   Obesity        Scheduled Meds:  amLODipine  10 mg Oral QHS   atorvastatin  20 mg Oral QHS   Chlorhexidine Gluconate Cloth  6 each Topical Daily   [START ON 01/18/2022] fentaNYL  1 patch Transdermal Q72H   fentaNYL       fluconazole  200 mg Oral Daily   heparin  5,000 Units Subcutaneous Q8H   insulin aspart  0-15 Units Subcutaneous TID WC   insulin aspart  0-5 Units Subcutaneous QHS   irbesartan  37.5 mg Oral Daily   lipase/protease/amylase  72,000 Units Oral TID AC   midazolam       pantoprazole  40 mg Oral Daily   polyethylene glycol  17 g Oral BID   Continuous Infusions:  sodium chloride 75 mL/hr at 01/16/22 0952   cefTRIAXone (ROCEPHIN)  IV Stopped (01/15/22 2148)   metronidazole 500 mg (01/16/22 0953)   PRN Meds:.acetaminophen **OR** acetaminophen, HYDROmorphone, lip balm, ondansetron **OR** ondansetron (ZOFRAN) IV   OBJECTIVE: Blood pressure 120/68, pulse 100, temperature 97.8 F (36.6 C), temperature source Oral, resp. rate (!) 26, height 5' 3.5" (1.613 m), weight 85.7 kg, SpO2 96 %.  Physical Exam  General/constitutional: no distress, pleasant, conversant, slightly lethargic HEENT: Normocephalic, PER, Conj Clear, EOMI, Oropharynx clear Neck supple CV: rrr no mrg Lungs: clear to auscultation, normal respiratory effort Abd: Soft, nondistended; diffusely tender but no guarding or rebound Ext: no edema Skin: No Rash Neuro: nonfocal MSK: no peripheral joint swelling/tenderness/warmth; back spines nontender   Central line presence: right chest port site nontender and no erythema   Lab  Results Lab Results  Component Value Date   WBC 9.9 01/16/2022   HGB 11.0 (L) 01/16/2022   HCT 34.5 (L) 01/16/2022   MCV 86.0 01/16/2022   PLT 422 (H) 01/16/2022    Lab Results  Component Value Date   CREATININE 0.47 01/16/2022   BUN 9 01/16/2022   NA 131 (L) 01/16/2022   K 3.9 01/16/2022   CL 97 (L) 01/16/2022   CO2 26 01/16/2022    Lab Results  Component Value Date   ALT 32 01/16/2022   AST 39 01/16/2022   GGT 69 (H) 11/25/2017   ALKPHOS 103 01/16/2022   BILITOT 0.7 01/16/2022      Microbiology: No results found for this or any previous visit (from the past 240 hour(s)).   Serology:    Imaging: If present, new imagings (plain films, ct scans, and mri) have been personally visualized and interpreted; radiology reports have been reviewed. Decision making incorporated into the Impression / Recommendations.  5/30 ct abd pelv with contrast Interval development of 9.8 x 9.2 cm  air-fluid collection in the central portion of the liver concerning for hepatic abscess.   There is again noted pancreatic head mass surrounds the common bile duct, and appears to be resulting in occlusion of the superior mesenteric and splenic veins as noted on prior exam. Severe pancreatic ductal dilatation is noted.   Hepatic pneumobilia is noted secondary to common bile duct stent.   1.4 cm partially enhancing exophytic mass is seen arising from lower pole right kidney concerning for possible neoplasm or malignancy.   Mildly enlarged periaortic adenopathy is noted concerning for metastatic disease.   Mild ascites is noted in the pelvis.   Aortic Atherosclerosis    5/30 ct chest pe protocol 1. No evidence of acute pulmonary embolism. 2. New small bilateral pleural effusions with mild atelectasis at both lung bases. 3. New large collection of gas and air within the porta hepatis/gastrohepatic ligament compared with abdominal CT of last month. This could reflect an abscess or  contained bowel perforation. Given venous thrombosis on prior abdominal CT, this could reflect cavitation of an infarct. Recommend further evaluation with dedicated CT of the abdomen and pelvis to include enteric contrast.    4/27 ct abd pelv 1. Progressive infiltrating tumor involving the pancreatic head as detailed above. New occlusion of the SMV, splenic vein and proximal portal vein. 2. Progressive periportal and peripancreatic lymph nodes. 3. No findings for hepatic metastatic disease. 4. Stable pneumobilia related to the biliary stent. 5. Hazy interstitial changes in the small bowel mesentery possibly due to venous congestion from the SMV obstruction. 6. High attenuation material in the gallbladder is likely sludge. 7. Aortic atherosclerosis      Jabier Mutton, [facility] [place] [phone] pager    01/16/2022, 12:44 PM

## 2022-01-16 NOTE — Progress Notes (Signed)
[facility] PROGRESS NOTE    Progress Note  Melanie Alvarez  FIE:[id number] DOB: 27-Mar-1956 DOA: 01/15/2022 PCP: Flossie Buffy, NP     Brief Narrative:   Melanie Alvarez is an 66 y.o. 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  She relates that when she belches or passes gas or abdominal pain is better.,  Has not had a bowel movement she relates in 1 to 2 weeks and has not vomited, chest x-ray in the ED shows small bilateral effusions and a large hepatic lesion of approximately 10 cm   Assessment/Plan:   Hepatic lesion/probably due to hepatic abscess: CT of abdomen and pelvis showed a 9 x 9 air-fluid collection in the central portion of the liver concerning for hepatic abscess. Infectious diseases been consulted. Started on IV Rocephin and Flagyl.  Tmax of 98.3. IR has been consulted for aspiration and possible drainage. Send blood cultures. We will also ask IR to send aspiration for cultures.  Pancreatic cancer: Follow-up with Dr. Marin Olp for future treatment.  Portal vein thrombosis: Holding Eliquis for now for possible IR procedure.  Constipation: Continue MiraLAX p.o. twice daily.  Dyspnea on exertion: Of unclear etiology may be related to ascites. CT PE small bilateral effusions unlikely to be contributing. BNP is mildly elevated, 2D echo is pending.  Hypovolemic hyponatremia: Improving with fluid resuscitation.  Diabetes mellitus type 2 not on insulin: Hold metformin and glipizide, continue sliding scale.  Hyperlipidemia: Continue statins.  Chronic pancreatic insufficiency: Continue Creon.  Essential  hypertension: Pressures well controlled, continue Norvasc and Avapro.  DVT prophylaxis: none Family Communication:none Status is: Observation The patient will require care spanning > 2 midnights and should be moved to inpatient because: Hepatic abscess requiring IV antibiotics which will need aspiration and drainage    Code Status:     Code Status Orders  (From admission, onward)           Start     Ordered   01/15/22 1903  Do not attempt resuscitation (DNR)  Continuous       Question Answer Comment  In the event of cardiac or respiratory ARREST Do not call a "code blue"   In the event of cardiac or respiratory ARREST Do not perform Intubation, CPR, defibrillation or ACLS   In the event of cardiac or respiratory ARREST Use medication by any route, position, wound care, and other measures to relive pain and suffering. May use oxygen, suction and manual treatment of airway obstruction as needed for comfort.      01/15/22 1904           Code Status History     Date Active Date Inactive Code Status Order ID Comments User Context   09/20/2019 2011 09/26/2019 1745 Full Code [id number]  Jani Gravel, MD ED         IV Access:   Peripheral IV   Procedures and diagnostic studies:   CT Angio Chest PE W and/or Wo Contrast  Result Date: 01/15/2022 CLINICAL DATA:  Shortness of breath with sensation of "feeling funny" over the last couple days. History of pancreatic cancer. Clinical concern for pulmonary embolism. EXAM: CT ANGIOGRAPHY CHEST WITH CONTRAST TECHNIQUE: Multidetector CT imaging of the chest was performed using  the standard protocol during bolus administration of intravenous contrast. Multiplanar CT image reconstructions and MIPs were obtained to evaluate the vascular anatomy. RADIATION DOSE REDUCTION: This exam was performed according to the departmental dose-optimization program which includes automated exposure control, adjustment of the mA and/or kV according to patient  size and/or use of iterative reconstruction technique. CONTRAST:  53m OMNIPAQUE IOHEXOL 350 MG/ML SOLN COMPARISON:  Chest radiographs 01/15/2022. CTs of the chest, abdomen and pelvis 12/21/2020 and CT of the abdomen and pelvis 12/13/2021. FINDINGS: Cardiovascular: The pulmonary arteries are well opacified with contrast to the level of the subsegmental branches. There is no evidence of acute pulmonary embolism. Right IJ Port-A-Cath extends into the right atrium. There is atherosclerosis of the aorta, great vessels and coronary arteries. The heart is mildly enlarged. No significant pericardial fluid. Mediastinum/Nodes: No enlarged mediastinal, hilar or axillary lymph nodes. Mildly prominent mediastinal lymph nodes are likely reactive. The thyroid gland, trachea and esophagus demonstrate no significant findings. Lungs/Pleura: New small left greater than right pleural effusions. Mild dependent atelectasis in both lungs. No confluent airspace opacity, suspicious pulmonary nodule or pneumothorax. Upper abdomen: Interval development of a large collection of air and gas within the porta hepatis/gastrohepatic ligament, measuring approximately 10.0 x 7.4 x 7.8 cm. This lies superior to metallic biliary stent. There is increased pneumobilia. No free intraperitoneal air or other extraluminal fluid collection identified in the upper abdomen. Musculoskeletal/Chest wall: Chronic glenohumeral arthropathy, right greater than left. Mild thoracic spondylosis. No acute osseous findings or chest wall masses. Review of the MIP images confirms the above findings. IMPRESSION: 1. No evidence of acute pulmonary embolism. 2. New small bilateral pleural effusions with mild atelectasis at both lung bases. 3. New large collection of gas and air within the porta hepatis/gastrohepatic ligament compared with abdominal CT of last month. This could reflect an abscess or contained bowel perforation. Given venous thrombosis on prior abdominal CT, this  could reflect cavitation of an infarct. Recommend further evaluation with dedicated CT of the abdomen and pelvis to include enteric contrast. 4. These results were called by telephone at the time of interpretation on 01/15/2022 at 1:11 pm to provider ADAM CURATOLO , who verbally acknowledged these results. Electronically Signed   By: WRichardean SaleM.D.   On: 01/15/2022 13:12   CT ABDOMEN PELVIS W CONTRAST  Result Date: 01/15/2022 CLINICAL DATA:  Acute generalized abdominal pain. History of pancreatic cancer. EXAM: CT ABDOMEN AND PELVIS WITH CONTRAST TECHNIQUE: Multidetector CT imaging of the abdomen and pelvis was performed using the standard protocol following bolus administration of intravenous contrast. RADIATION DOSE REDUCTION: This exam was performed according to the departmental dose-optimization program which includes automated exposure control, adjustment of the mA and/or kV according to patient size and/or use of iterative reconstruction technique. CONTRAST:  1010mOMNIPAQUE IOHEXOL 300 MG/ML  SOLN COMPARISON:  December 13, 2021. FINDINGS: Lower chest: Minimal bilateral posterior basilar subsegmental atelectasis is noted. Hepatobiliary: Hepatic pneumobilia is noted most likely due to common bile duct stent. There is interval development of 9.8 x 9.2 cm air-fluid collection in the central portion of the liver concerning for hepatic abscess. No gallstones are noted, although pneumobilia has extended into the gallbladder lumen. Pancreas: There is again noted pancreatic head mass that surrounds the common bile duct, and appears to be resulting in occlusion of the superior mesenteric and splenic veins. Severe pancreatic ductal dilatation is noted secondary to this tumor. Spleen: Normal in size without focal abnormality. Adrenals/Urinary Tract: Adrenal glands are unremarkable. No hydronephrosis or  renal obstruction is noted. No renal or ureteral calculi are noted. 1.4 cm partially enhancing exophytic mass is  seen arising from lower pole of right kidney which was present on prior exam and concerning for possible neoplasm. Bladder is unremarkable. Stomach/Bowel: Stomach is within normal limits. Appendix appears normal. No evidence of bowel wall thickening, distention, or inflammatory changes. Vascular/Lymphatic: Aortic atherosclerosis. Mildly enlarged periaortic adenopathy is noted concerning for metastatic disease. Reproductive: Uterus and bilateral adnexa are unremarkable. Other: No hernia is noted.  Mild ascites is noted in the pelvis. Musculoskeletal: No acute or significant osseous findings. IMPRESSION: Interval development of 9.8 x 9.2 cm air-fluid collection in the central portion of the liver concerning for hepatic abscess. There is again noted pancreatic head mass surrounds the common bile duct, and appears to be resulting in occlusion of the superior mesenteric and splenic veins as noted on prior exam. Severe pancreatic ductal dilatation is noted. Hepatic pneumobilia is noted secondary to common bile duct stent. 1.4 cm partially enhancing exophytic mass is seen arising from lower pole right kidney concerning for possible neoplasm or malignancy. Mildly enlarged periaortic adenopathy is noted concerning for metastatic disease. Mild ascites is noted in the pelvis. Aortic Atherosclerosis (ICD10-I70.0). Electronically Signed   By: Marijo Conception M.D.   On: 01/15/2022 16:02   DG Chest Portable 1 View  Result Date: 01/15/2022 CLINICAL DATA:  Short of breath EXAM: PORTABLE CHEST 1 VIEW COMPARISON:  09/19/2014 FINDINGS: Heart size mildly enlarged. Negative for heart failure or edema. Mild left lower lobe atelectasis/infiltrate. Right lung clear. No effusion. Port-A-Cath tip in the lower SVC at the cavoatrial junction Degenerative change in both shoulders right greater than left IMPRESSION: Mild left lower lobe airspace disease likely atelectasis Port-A-Cath tip at the cavoatrial junction. Electronically Signed   By:  Franchot Gallo M.D.   On: 01/15/2022 11:15     Medical Consultants:   None.   Subjective:    Melanie Alvarez relates her pain is controlled.  Objective:    Vitals:   01/15/22 2127 01/16/22 0114 01/16/22 0114 01/16/22 0628  BP: 138/82 128/73 128/73 128/70  Pulse: 84 85 86 91  Resp: '18 18 18 18  '$ Temp: (!) 97.5 F (36.4 C) 98.1 F (36.7 C) 98.1 F (36.7 C) 97.8 F (36.6 C)  TempSrc: Oral Oral Oral Oral  SpO2: 97% 95% 96% 95%  Weight:      Height:       SpO2: 95 %   Intake/Output Summary (Last 24 hours) at 01/16/2022 0756 Last data filed at 01/16/2022 0434 Gross per 24 hour  Intake 1845.03 ml  Output --  Net 1845.03 ml   Filed Weights   01/15/22 1116  Weight: 85.7 kg    Exam: General exam: In no acute distress. Respiratory system: Good air movement and clear to auscultation. Cardiovascular system: S1 & S2 heard, RRR. No JVD. Gastrointestinal system: Abdomen is nondistended, soft and nontender.  Extremities: No pedal edema. Skin: No rashes, lesions or ulcers Psychiatry: Judgement and insight appear normal. Mood & affect appropriate.    Data Reviewed:    Labs: Basic Metabolic Panel: Recent Labs  Lab 01/09/22 0821 01/15/22 1116 01/16/22 0500  NA 123* 128* 131*  K 4.8 4.1 3.9  CL 87* 92* 97*  CO2 '28 26 26  '$ GLUCOSE 208* 283* 100*  BUN '12 13 9  '$ CREATININE 0.68 0.67 0.47  CALCIUM 8.7* 8.1* 8.0*   GFR Estimated Creatinine Clearance: 74.5 mL/min (by C-G formula based on SCr of  0.47 mg/dL). Liver Function Tests: Recent Labs  Lab 01/09/22 0821 01/15/22 1116 01/16/22 0500  AST 46* 41 39  ALT 43 36 32  ALKPHOS 128* 105 103  BILITOT 0.8 0.8 0.7  PROT 6.7 6.9 6.5  ALBUMIN 2.8* 2.2* 2.0*   Recent Labs  Lab 01/15/22 1116  LIPASE 23   No results for input(s): AMMONIA in the last 168 hours. Coagulation profile No results for input(s): INR, PROTIME in the last 168 hours. COVID-19 Labs  No results for input(s): DDIMER, FERRITIN, LDH, CRP in  the last 72 hours.  Lab Results  Component Value Date   SARSCOV2NAA NEGATIVE 01/15/2020   [place] NEGATIVE 09/20/2019   [place] Not Detected 07/06/2019    CBC: Recent Labs  Lab 01/09/22 0821 01/15/22 1116 01/16/22 0500  WBC 7.6 9.1 9.9  NEUTROABS 6.3 6.5  --   HGB 7.4* 11.8* 11.0*  HCT 22.7* 36.5 34.5*  MCV 81.7 85.1 86.0  PLT 298 538* 422*   Cardiac Enzymes: No results for input(s): CKTOTAL, CKMB, CKMBINDEX, TROPONINI in the last 168 hours. BNP (last 3 results) No results for input(s): PROBNP in the last 8760 hours. CBG: Recent Labs  Lab 01/15/22 2124 01/16/22 0730  GLUCAP 75 112*   D-Dimer: No results for input(s): DDIMER in the last 72 hours. Hgb A1c: No results for input(s): HGBA1C in the last 72 hours. Lipid Profile: No results for input(s): CHOL, HDL, LDLCALC, TRIG, CHOLHDL, LDLDIRECT in the last 72 hours. Thyroid function studies: No results for input(s): TSH, T4TOTAL, T3FREE, THYROIDAB in the last 72 hours.  Invalid input(s): FREET3 Anemia work up: No results for input(s): VITAMINB12, FOLATE, FERRITIN, TIBC, IRON, RETICCTPCT in the last 72 hours. Sepsis Labs: Recent Labs  Lab 01/09/22 0821 01/15/22 1116 01/16/22 0500  WBC 7.6 9.1 9.9   Microbiology No results found for this or any previous visit (from the past 240 hour(s)).   Medications:    amLODipine  10 mg Oral QHS   atorvastatin  20 mg Oral QHS   Chlorhexidine Gluconate Cloth  6 each Topical Daily   [START ON 01/18/2022] fentaNYL  1 patch Transdermal Q72H   fluconazole  200 mg Oral Daily   heparin  5,000 Units Subcutaneous Q8H   insulin aspart  0-15 Units Subcutaneous TID WC   insulin aspart  0-5 Units Subcutaneous QHS   irbesartan  37.5 mg Oral Daily   lipase/protease/amylase  72,000 Units Oral TID AC   pantoprazole  40 mg Oral Daily   polyethylene glycol  17 g Oral Daily   Continuous Infusions:  sodium chloride 75 mL/hr at 01/16/22 0434   cefTRIAXone (ROCEPHIN)  IV Stopped  (01/15/22 2148)   metronidazole Stopped (01/15/22 2223)      LOS: 0 days   Charlynne Cousins  [facility]  01/16/2022, 7:56 AM

## 2022-01-17 DIAGNOSIS — K769 Liver disease, unspecified: Secondary | ICD-10-CM | POA: Diagnosis not present

## 2022-01-17 LAB — BASIC METABOLIC PANEL
Anion gap: 8 (ref 5–15)
BUN: 7 mg/dL — ABNORMAL LOW (ref 8–23)
CO2: 24 mmol/L (ref 22–32)
Calcium: 7.5 mg/dL — ABNORMAL LOW (ref 8.9–10.3)
Chloride: 100 mmol/L (ref 98–111)
Creatinine, Ser: 0.61 mg/dL (ref 0.44–1.00)
GFR, Estimated: >60 mL/min (ref >60)
Glucose, Bld: 157 mg/dL — ABNORMAL HIGH (ref 70–99)
Potassium: 3.1 mmol/L — ABNORMAL LOW (ref 3.5–5.1)
Sodium: 132 mmol/L — ABNORMAL LOW (ref 135–145)

## 2022-01-17 LAB — GLUCOSE, CAPILLARY
Glucose-Capillary: 116 mg/dL — ABNORMAL HIGH (ref 70–99)
Glucose-Capillary: 156 mg/dL — ABNORMAL HIGH (ref 70–99)
Glucose-Capillary: 145 mg/dL — ABNORMAL HIGH (ref 70–99)
Glucose-Capillary: 186 mg/dL — ABNORMAL HIGH (ref 70–99)

## 2022-01-17 LAB — E. HISTOLYTICA ANTIBODY (AMOEBA AB): E histolytica Ab: NEGATIVE

## 2022-01-17 MED ORDER — POTASSIUM CHLORIDE CRYS ER 20 MEQ PO TBCR
40.0000 meq | EXTENDED_RELEASE_TABLET | Freq: Two times a day (BID) | ORAL | Status: AC
  Administered 2022-01-17 (×2): 40 meq via ORAL
  Filled 2022-01-17 (×2): qty 2

## 2022-01-17 MED ORDER — POTASSIUM CHLORIDE CRYS ER 20 MEQ PO TBCR: 40.0000 meq | EXTENDED_RELEASE_TABLET | Freq: Two times a day (BID) | ORAL | Status: DC

## 2022-01-17 MED ORDER — FUROSEMIDE 10 MG/ML IJ SOLN: 40.0000 mg | Freq: Once | INTRAMUSCULAR | Status: DC

## 2022-01-17 NOTE — Progress Notes (Signed)
Referring Physician(s): Kathie Dike, MD  Supervising Physician: Sandi Mariscal  Patient Status:  [facility] - In-pt  Chief Complaint:  Hepatic abscess, s/p drain placement by Dr. Vernard Gambles on 01/16/22.   Subjective:  Pt laying in bed, NAD.  Reports she feels much better than yesterday, still has RUQ/epigastric tenderness but it is not bad.   Allergies: Lisinopril  Medications: Prior to Admission medications   Medication Sig Start Date End Date Taking? Authorizing Provider  amLODipine (NORVASC) 10 MG tablet Take 1 tablet (10 mg total) by mouth at bedtime. 11/27/21  Yes Nche, Charlene Brooke, NP  APIXABAN Arne [place]) VTE STARTER PACK ('10MG'$  AND '5MG'$ ) Take as directed on package: start with two-'5mg'$  tablets twice daily for 7 days. On day 8, switch to one-'5mg'$  tablet twice daily. Patient taking differently: Take 5 mg by mouth 2 (two) times daily. 12/24/21  Yes Volanda Napoleon, MD  atorvastatin (LIPITOR) 20 MG tablet Take 1 tablet (20 mg total) by mouth at bedtime. 01/25/21  Yes Nche, Charlene Brooke, NP  CREON 36000-114000 units CPEP capsule TAKE 2 CAPSULES BY MOUTH THREE TIMES DAILY BEFORE MEAL(S) Patient taking differently: Take 72,000 Units by mouth 3 (three) times daily before meals. 11/19/21  Yes Volanda Napoleon, MD  feeding supplement (BOOST HIGH PROTEIN) LIQD Take 2 Containers by mouth in the morning.   Yes [provider]  fentaNYL (DURAGESIC) 25 MCG/HR Place 1 patch onto the skin every 3 (three) days. 01/02/22  Yes Volanda Napoleon, MD  fluticasone (FLONASE) 50 MCG/ACT nasal spray Place 2 sprays into both nostrils daily. Patient taking differently: Place 2 sprays into both nostrils daily as needed for allergies or rhinitis. 05/14/21  Yes Tower, Wynelle Fanny, MD  glipiZIDE (GLUCOTROL XL) 5 MG 24 hr tablet Take 1 tablet (5 mg total) by mouth daily with breakfast. 09/14/21  Yes Nche, Charlene Brooke, NP  HYDROmorphone (DILAUDID) 4 MG tablet Take 1 tablet (4 mg total) by mouth every 4 (four) hours as  needed for severe pain. 01/02/22  Yes Volanda Napoleon, MD  lidocaine-prilocaine (EMLA) cream Apply to affected area once Patient taking differently: 1 application. as needed (for port access). 12/26/21  Yes Volanda Napoleon, MD  metFORMIN (GLUCOPHAGE XR) 750 MG 24 hr tablet Take 1 tablet (750 mg total) by mouth daily with breakfast. 09/14/21  Yes Nche, Charlene Brooke, NP  olmesartan (BENICAR) 5 MG tablet Take 1 tablet (5 mg total) by mouth daily. 01/25/21  Yes Nche, Charlene Brooke, NP  ondansetron (ZOFRAN) 8 MG tablet Take 1 tablet (8 mg total) by mouth 2 (two) times daily as needed (Nausea or vomiting). Patient taking differently: Take 8 mg by mouth 2 (two) times daily as needed for nausea or vomiting. 12/26/21  Yes Ennever, Rudell Cobb, MD  pantoprazole (PROTONIX) 40 MG tablet Take 1 tablet (40 mg total) by mouth daily. 12/24/21  Yes Ennever, Rudell Cobb, MD  prochlorperazine (COMPAZINE) 10 MG tablet Take 1 tablet (10 mg total) by mouth every 6 (six) hours as needed (Nausea or vomiting). Patient taking differently: Take 10 mg by mouth every 6 (six) hours as needed for nausea or vomiting. 12/26/21  Yes Ennever, Rudell Cobb, MD  TYLENOL 8 HOUR 650 MG CR tablet Take 650-1,300 mg by mouth every 8 (eight) hours as needed for pain.   Yes [provider]  glucose blood test strip Test blood glucose once daily. One Touch Verio test strips 09/28/19   Nche, Charlene Brooke, NP  metoprolol succinate (TOPROL-XL) 25 MG  24 hr tablet TAKE 1 TABLET BY MOUTH ONCE DAILY Patient not taking: Reported on 01/15/2022 01/25/21   Nche, Charlene Brooke, NP  pantoprazole (PROTONIX) 40 MG tablet Take 1 tablet (40 mg total) by mouth 2 (two) times daily for 14 days. Patient not taking: Reported on 01/15/2022 12/04/21 12/18/21  Nche, Charlene Brooke, NP  potassium chloride (KLOR-CON M) 20 MEQ tablet Take 1 tablet (20 mEq total) by mouth 2 (two) times daily. Patient not taking: Reported on 01/15/2022 11/27/21   Flossie Buffy, NP     Vital  Signs: BP 129/67 (BP Location: Left Arm)   Pulse 98   Temp 98 F (36.7 C) (Oral)   Resp 18   Ht 5' 3.5" (1.613 m)   Wt 189 lb (85.7 kg)   SpO2 94%   BMI 32.95 kg/m   Physical Exam Vitals reviewed.  Constitutional:      General: She is not in acute distress.    Appearance: She is well-developed. She is not ill-appearing.  Pulmonary:     Effort: Pulmonary effort is normal.  Skin:    General: Skin is warm and dry.     Coloration: Skin is not cyanotic or pale.     Comments: Positive RUQ drain to gravity bag. Site is unremarkable with no erythema, edema, tenderness, bleeding or drainage. Suture and stat lock in place. Dressing is clean, dry, and intact. 10 ml of  tan colored fluid noted in the bag. Drain aspirates and flushes well.    Neurological:     Mental Status: She is alert and oriented to person, place, and time.  Psychiatric:        Mood and Affect: Mood normal.        Behavior: Behavior normal.    Imaging: CT Angio Chest PE W and/or Wo Contrast  Result Date: 01/15/2022 CLINICAL DATA:  Shortness of breath with sensation of "feeling funny" over the last couple days. History of pancreatic cancer. Clinical concern for pulmonary embolism. EXAM: CT ANGIOGRAPHY CHEST WITH CONTRAST TECHNIQUE: Multidetector CT imaging of the chest was performed using the standard protocol during bolus administration of intravenous contrast. Multiplanar CT image reconstructions and MIPs were obtained to evaluate the vascular anatomy. RADIATION DOSE REDUCTION: This exam was performed according to the departmental dose-optimization program which includes automated exposure control, adjustment of the mA and/or kV according to patient size and/or use of iterative reconstruction technique. CONTRAST:  35m OMNIPAQUE IOHEXOL 350 MG/ML SOLN COMPARISON:  Chest radiographs 01/15/2022. CTs of the chest, abdomen and pelvis 12/21/2020 and CT of the abdomen and pelvis 12/13/2021. FINDINGS: Cardiovascular: The pulmonary  arteries are well opacified with contrast to the level of the subsegmental branches. There is no evidence of acute pulmonary embolism. Right IJ Port-A-Cath extends into the right atrium. There is atherosclerosis of the aorta, great vessels and coronary arteries. The heart is mildly enlarged. No significant pericardial fluid. Mediastinum/Nodes: No enlarged mediastinal, hilar or axillary lymph nodes. Mildly prominent mediastinal lymph nodes are likely reactive. The thyroid gland, trachea and esophagus demonstrate no significant findings. Lungs/Pleura: New small left greater than right pleural effusions. Mild dependent atelectasis in both lungs. No confluent airspace opacity, suspicious pulmonary nodule or pneumothorax. Upper abdomen: Interval development of a large collection of air and gas within the porta hepatis/gastrohepatic ligament, measuring approximately 10.0 x 7.4 x 7.8 cm. This lies superior to metallic biliary stent. There is increased pneumobilia. No free intraperitoneal air or other extraluminal fluid collection identified in the upper abdomen. Musculoskeletal/Chest wall: Chronic  glenohumeral arthropathy, right greater than left. Mild thoracic spondylosis. No acute osseous findings or chest wall masses. Review of the MIP images confirms the above findings. IMPRESSION: 1. No evidence of acute pulmonary embolism. 2. New small bilateral pleural effusions with mild atelectasis at both lung bases. 3. New large collection of gas and air within the porta hepatis/gastrohepatic ligament compared with abdominal CT of last month. This could reflect an abscess or contained bowel perforation. Given venous thrombosis on prior abdominal CT, this could reflect cavitation of an infarct. Recommend further evaluation with dedicated CT of the abdomen and pelvis to include enteric contrast. 4. These results were called by telephone at the time of interpretation on 01/15/2022 at 1:11 pm to provider ADAM CURATOLO , who verbally  acknowledged these results. Electronically Signed   By: Richardean Sale M.D.   On: 01/15/2022 13:12   CT ABDOMEN PELVIS W CONTRAST  Result Date: 01/15/2022 CLINICAL DATA:  Acute generalized abdominal pain. History of pancreatic cancer. EXAM: CT ABDOMEN AND PELVIS WITH CONTRAST TECHNIQUE: Multidetector CT imaging of the abdomen and pelvis was performed using the standard protocol following bolus administration of intravenous contrast. RADIATION DOSE REDUCTION: This exam was performed according to the departmental dose-optimization program which includes automated exposure control, adjustment of the mA and/or kV according to patient size and/or use of iterative reconstruction technique. CONTRAST:  149m OMNIPAQUE IOHEXOL 300 MG/ML  SOLN COMPARISON:  December 13, 2021. FINDINGS: Lower chest: Minimal bilateral posterior basilar subsegmental atelectasis is noted. Hepatobiliary: Hepatic pneumobilia is noted most likely due to common bile duct stent. There is interval development of 9.8 x 9.2 cm air-fluid collection in the central portion of the liver concerning for hepatic abscess. No gallstones are noted, although pneumobilia has extended into the gallbladder lumen. Pancreas: There is again noted pancreatic head mass that surrounds the common bile duct, and appears to be resulting in occlusion of the superior mesenteric and splenic veins. Severe pancreatic ductal dilatation is noted secondary to this tumor. Spleen: Normal in size without focal abnormality. Adrenals/Urinary Tract: Adrenal glands are unremarkable. No hydronephrosis or renal obstruction is noted. No renal or ureteral calculi are noted. 1.4 cm partially enhancing exophytic mass is seen arising from lower pole of right kidney which was present on prior exam and concerning for possible neoplasm. Bladder is unremarkable. Stomach/Bowel: Stomach is within normal limits. Appendix appears normal. No evidence of bowel wall thickening, distention, or inflammatory  changes. Vascular/Lymphatic: Aortic atherosclerosis. Mildly enlarged periaortic adenopathy is noted concerning for metastatic disease. Reproductive: Uterus and bilateral adnexa are unremarkable. Other: No hernia is noted.  Mild ascites is noted in the pelvis. Musculoskeletal: No acute or significant osseous findings. IMPRESSION: Interval development of 9.8 x 9.2 cm air-fluid collection in the central portion of the liver concerning for hepatic abscess. There is again noted pancreatic head mass surrounds the common bile duct, and appears to be resulting in occlusion of the superior mesenteric and splenic veins as noted on prior exam. Severe pancreatic ductal dilatation is noted. Hepatic pneumobilia is noted secondary to common bile duct stent. 1.4 cm partially enhancing exophytic mass is seen arising from lower pole right kidney concerning for possible neoplasm or malignancy. Mildly enlarged periaortic adenopathy is noted concerning for metastatic disease. Mild ascites is noted in the pelvis. Aortic Atherosclerosis (ICD10-I70.0). Electronically Signed   By: JMarijo ConceptionM.D.   On: 01/15/2022 16:02   DG Chest Portable 1 View  Result Date: 01/15/2022 CLINICAL DATA:  Short of breath EXAM: PORTABLE CHEST  1 VIEW COMPARISON:  09/19/2014 FINDINGS: Heart size mildly enlarged. Negative for heart failure or edema. Mild left lower lobe atelectasis/infiltrate. Right lung clear. No effusion. Port-A-Cath tip in the lower SVC at the cavoatrial junction Degenerative change in both shoulders right greater than left IMPRESSION: Mild left lower lobe airspace disease likely atelectasis Port-A-Cath tip at the cavoatrial junction. Electronically Signed   By: Franchot Gallo M.D.   On: 01/15/2022 11:15   ECHOCARDIOGRAM COMPLETE  Result Date: 01/16/2022    ECHOCARDIOGRAM REPORT   Patient Name:   KENADI MILTNER Date of Exam: 01/16/2022 Medical Rec #:  [id number]         Height:       63.5 in Accession #:    [id number]         Weight:       189.0 lb Date of Birth:  1956/01/22         BSA:          1.899 m Patient Age:    25 years          BP:           128/70 mmHg Patient Gender: F                 HR:           100 bpm. Exam Location:  Inpatient Procedure: 2D Echo, Cardiac Doppler, Color Doppler and Intracardiac            Opacification Agent Indications:    I50.40* Unspecified combined systolic (congestive) and diastolic                 (congestive) heart failure  History:        Patient has no prior history of Echocardiogram examinations.                 Signs/Symptoms:Shortness of Breath, Dyspnea and                 Dizziness/Lightheadedness; Risk Factors:Hypertension, Diabetes                 and Dyslipidemia. Cancer. ETOH.  Sonographer:    Roseanna Rainbow RDCS Referring Phys: [phone] [facility]  Sonographer Comments: Technically difficult study due to poor echo windows. Patient could not tolerate pressure from probe. Recent drain placed in subcostal region. IMPRESSIONS  1. Left ventricular ejection fraction, by estimation, is 40 to 45%. The left ventricle has mildly decreased function. The left ventricle demonstrates global hypokinesis. Left ventricular diastolic parameters are consistent with Grade I diastolic dysfunction (impaired relaxation). Elevated left ventricular end-diastolic pressure.  2. Right ventricular systolic function is normal. The right ventricular size is normal.  3. The mitral valve is normal in structure. Trivial mitral valve regurgitation. No evidence of mitral stenosis.  4. The aortic valve is tricuspid. Aortic valve regurgitation is not visualized. Mild aortic valve stenosis. Aortic valve area, by VTI measures 1.62 cm. Aortic valve mean gradient measures 11.0 mmHg. Aortic valve Vmax measures 2.28 m/s.  5. The inferior vena cava is normal in size with greater than 50% respiratory variability, suggesting right atrial pressure of 3 mmHg. FINDINGS  Left Ventricle: Left ventricular ejection fraction, by estimation, is  40 to 45%. The left ventricle has mildly decreased function. The left ventricle demonstrates global hypokinesis. The left ventricular internal cavity size was normal in size. There is  no left ventricular hypertrophy. Left ventricular diastolic parameters are consistent with Grade I diastolic dysfunction (impaired relaxation). Elevated left ventricular  end-diastolic pressure. Right Ventricle: The right ventricular size is normal. No increase in right ventricular wall thickness. Right ventricular systolic function is normal. Left Atrium: Left atrial size was normal in size. Right Atrium: Right atrial size was normal in size. Pericardium: There is no evidence of pericardial effusion. Mitral Valve: The mitral valve is normal in structure. Trivial mitral valve regurgitation. No evidence of mitral valve stenosis. MV peak gradient, 11.8 mmHg. The mean mitral valve gradient is 5.0 mmHg. Tricuspid Valve: The tricuspid valve is normal in structure. Tricuspid valve regurgitation is trivial. No evidence of tricuspid stenosis. Aortic Valve: The aortic valve is tricuspid. Aortic valve regurgitation is not visualized. Mild aortic stenosis is present. Aortic valve mean gradient measures 11.0 mmHg. Aortic valve peak gradient measures 20.7 mmHg. Aortic valve area, by VTI measures 1.62 cm. Pulmonic Valve: The pulmonic valve was normal in structure. Pulmonic valve regurgitation is not visualized. No evidence of pulmonic stenosis. Aorta: The aortic root is normal in size and structure. Venous: The inferior vena cava was not well visualized. The inferior vena cava is normal in size with greater than 50% respiratory variability, suggesting right atrial pressure of 3 mmHg. IAS/Shunts: No atrial level shunt detected by color flow Doppler.  LEFT VENTRICLE PLAX 2D LVIDd:         4.50 cm     Diastology LVIDs:         3.20 cm     LV e' medial:    4.57 cm/s LV PW:         1.00 cm     LV E/e' medial:  18.5 LV IVS:        1.00 cm     LV e'  lateral:   5.00 cm/s LVOT diam:     1.70 cm     LV E/e' lateral: 16.9 LV SV:         54 LV SV Index:   28 LVOT Area:     2.27 cm  LV Volumes (MOD) LV vol d, MOD A2C: 90.9 ml LV vol d, MOD A4C: 80.6 ml LV vol s, MOD A2C: 44.1 ml LV vol s, MOD A4C: 51.8 ml LV SV MOD A2C:     46.8 ml LV SV MOD A4C:     80.6 ml LV SV MOD BP:      37.5 ml RIGHT VENTRICLE RV S prime:     16.20 cm/s TAPSE (M-mode): 2.0 cm LEFT ATRIUM             Index        RIGHT ATRIUM          Index LA diam:        3.40 cm 1.79 cm/m   RA Area:     9.83 cm LA Vol (A2C):   29.0 ml 15.27 ml/m  RA Volume:   18.20 ml 9.58 ml/m LA Vol (A4C):   33.1 ml 17.43 ml/m LA Biplane Vol: 31.7 ml 16.69 ml/m  AORTIC VALVE AV Area (Vmax):    1.63 cm AV Area (Vmean):   1.59 cm AV Area (VTI):     1.62 cm AV Vmax:           227.67 cm/s AV Vmean:          151.667 cm/s AV VTI:            0.330 m AV Peak Grad:      20.7 mmHg AV Mean Grad:      11.0 mmHg LVOT Vmax:  163.00 cm/s LVOT Vmean:        106.000 cm/s LVOT VTI:          0.236 m LVOT/AV VTI ratio: 0.72  AORTA Ao Root diam: 2.60 cm Ao Asc diam:  3.00 cm MITRAL VALVE                TRICUSPID VALVE MV Area (PHT): 5.52 cm     TR Peak grad:   10.0 mmHg MV Area VTI:   1.65 cm     TR Vmax:        158.00 cm/s MV Peak grad:  11.8 mmHg MV Mean grad:  5.0 mmHg     SHUNTS MV Vmax:       1.72 m/s     Systemic VTI:  0.24 m MV Vmean:      96.8 cm/s    Systemic Diam: 1.70 cm MV Decel Time: 137 msec MV E velocity: 84.33 cm/s MV A velocity: 156.33 cm/s MV E/A ratio:  0.54 Skeet Latch MD Electronically signed by Skeet Latch MD Signature Date/Time: 01/16/2022/4:14:37 PM    Final    CT IMAGE GUIDED DRAINAGE BY PERCUTANEOUS CATHETER  Result Date: 01/17/2022 CLINICAL DATA:  Pancreatic carcinoma status post biliary stenting. 9.8 cm gas and fluid collection in the porta hepatis EXAM: CT GUIDED DRAINAGE OF ABDOMINAL ABSCESS ANESTHESIA/SEDATION: Intravenous Fentanyl 129mg and Versed '2mg'$  were administered as conscious  sedation during continuous monitoring of the patient's level of consciousness and physiological / cardiorespiratory status by the radiology RN, with a total moderate sedation time of 20 minutes. PROCEDURE: The procedure, risks, benefits, and alternatives were explained to the patient. Questions regarding the procedure were encouraged and answered. the patient understands and consents to the procedure. select axial scans through the abdomen were obtained. the collection was localized an appropriate skin entry site was determined and marked. The operative field was prepped with chlorhexidinein a sterile fashion, and a sterile drape was applied covering the operative field. A sterile gown and sterile gloves were used for the procedure. Local anesthesia was provided with 1% Lidocaine. Under CT fluoroscopic guidance, 18 gauge needle was advanced into the collection. Amplatz guidewire advanced easily. CT confirmed appropriate positioning. Tract dilated with 10 French dilator to facilitate placement of a 10 French pigtail drain catheter, formed centrally within the dependent aspect of the collection. 10 mL of thin purulent material were aspirated, sent for Gram stain and culture. Catheter secured externally 0 Prolene suture and StatLock and placed to gravity drain bag. The patient tolerated the procedure well. COMPLICATIONS: None immediate FINDINGS: Large gas and fluid collection in the porta hepatis was localized. 10 French drain catheter placed as above. 10 mL of purulent aspirate sent for Gram stain and culture. IMPRESSION: 1. Technically successful subhepatic abscess drain catheter placement with CT guidance. RADIATION DOSE REDUCTION: This exam was performed according to the departmental dose-optimization program which includes automated exposure control, adjustment of the mA and/or kV according to patient size and/or use of iterative reconstruction technique. Electronically Signed   By: DLucrezia EuropeM.D.   On:  01/17/2022 07:37    Labs:  CBC: Recent Labs    01/02/22 0840 01/09/22 0821 01/15/22 1116 01/16/22 0500  WBC 28.2* 7.6 9.1 9.9  HGB 9.2* 7.4* 11.8* 11.0*  HCT 28.1* 22.7* 36.5 34.5*  PLT 489* 298 538* 422*    COAGS: Recent Labs    01/16/22 1325  INR 3.7*    BMP: Recent Labs    01/02/22 0840 01/09/22 0821 01/15/22  1116 01/16/22 0500  NA 125* 123* 128* 131*  K 3.9 4.8 4.1 3.9  CL 90* 87* 92* 97*  CO2 '28 28 26 26  '$ GLUCOSE 136* 208* 283* 100*  BUN '8 12 13 9  '$ CALCIUM 9.0 8.7* 8.1* 8.0*  CREATININE 0.55 0.68 0.67 0.47  GFRNONAA >60 >60 >60 >60    LIVER FUNCTION TESTS: Recent Labs    01/02/22 0840 01/09/22 0821 01/15/22 1116 01/16/22 0500  BILITOT 0.6 0.8 0.8 0.7  AST 51* 46* 41 39  ALT 35 43 36 32  ALKPHOS 92 128* 105 103  PROT 6.9 6.7 6.9 6.5  ALBUMIN 3.0* 2.8* 2.2* 2.0*    Assessment and Plan:  66 y.o. female with hepatic abscess s/p drain placement by Dr. Vernard Gambles on 01/16/22.   VSS, afebrile No labs today  OP purulent, cx pending   Drain Location: RUQ Size: Fr size: 10 Fr Date of placement: 01/16/22  Currently to: Drain collection device: gravity 24 hour output:  Output by Drain (mL) 01/15/22 0701 - 01/15/22 1900 01/15/22 1901 - 01/16/22 0700 01/16/22 0701 - 01/16/22 1900 01/16/22 1901 - 01/17/22 0700 01/17/22 0701 - 01/17/22 1027  Closed System Drain Medial RLQ Other (Comment)    85     Interval imaging/drain manipulation:  None   Current examination: Flushes/aspirates easily.  Insertion site unremarkable. Suture and stat lock in place. Dressed appropriately.   Plan: Continue TID flushes with 5 cc NS. Record output Q shift. Dressing changes QD or PRN if soiled.  Call IR APP or on call IR MD if difficulty flushing or sudden change in drain output.  Repeat imaging/possible drain injection once output < 10 mL/QD (excluding flush material.)  Discharge planning: Please contact IR APP or on call IR MD prior to patient d/c to ensure  appropriate follow up plans are in place. Typically patient will follow up with IR clinic 10-14 days post d/c for repeat imaging/possible drain injection. IR scheduler will contact patient with date/time of appointment. Patient will need to flush drain QD with 5 cc NS, record output QD, dressing changes every 2-3 days or earlier if soiled.   IR will continue to follow - please call with questions or concerns.   Electronically Signed: Tera Mater, PA-C 01/17/2022, 10:26 AM   I spent a total of 15 Minutes at the the patient's bedside AND on the patient's hospital floor or unit, greater than 50% of which was counseling/coordinating care for hepatic drain.   This chart was dictated using voice recognition software.  Despite best efforts to proofread,  errors can occur which can change the documentation meaning.

## 2022-01-17 NOTE — Progress Notes (Signed)
Ms. Solan looks a lot better.  She had the drain put into this hepatic abscess.  Cultures are pending.  She feels a lot better.  She does not have any problems with pain.  She had an echocardiogram done yesterday.  She had an ejection fraction of 40-45%.  Her valves looked okay.  Again, she looks much better.  She feels good.  Hopefully she will be able to eat more today.  There is no labs back yet today.  It is interesting to see what comes out of this abscess.  Hopefully, cytology was sent off.  Again I think would be unusual for this to have been from a malignant tumor since she had no metastasis to the liver just a month or so ago.  Again I am happy that she is feeling better.  We will have to see what her lab work looks like.  The thrush is gone in her mouth.  Her vital signs show temperature of 98.  Pulse 98.  Blood pressure 129/67.  Her lungs are clear bilaterally.  Oral exam does not show any thrush.  Cardiac exam regular rate and rhythm.  Abdomen is somewhat distended.  Bowel sounds are present.  There is no guarding or rebound tenderness.  She has a drain in the right upper quadrant.  Extremity shows no clubbing, cyanosis or edema.  Neurological exam is nonfocal.  Again, we need to await the cultures on this abscess.  She is on IV antibiotics.  Cytology will also be very critical I think.  We will see what her labs look like tomorrow.  Again she looks a whole lot better.  The abscess drainage is definitely a factor.  IV fluids are definitely helping.  Most of all, the incredible help she is getting from the staff upon [facility] E. is doing a great job for her recovery.  Lattie Haw, MD  Psalm 23:1-2

## 2022-01-17 NOTE — Evaluation (Signed)
Physical Therapy Evaluation Patient Details Name: Melanie Alvarez MRN: [id number] DOB: [birth year] Today's Date: 01/17/2022  History of Present Illness  66 y.o. 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  Dx of hepatic abscess, drain placed 01/16/22.  Clinical Impression  Pt is mobilizing well, she ambulated 100' holding IV pole without loss of balance. She is not requiring assistance for mobility, she is ready to DC home from a PT standpoint, no further PT indicated, will sign off. I encouraged pt to ambulate in the halls 2-3x/day to minimize deconditioning during hospitalization.        Recommendations for follow up therapy are one component of a multi-disciplinary discharge planning process, led by the attending physician.  Recommendations may be updated based on patient status, additional functional criteria and insurance authorization.  Follow Up Recommendations No PT follow up    Assistance Recommended at Discharge None  Patient can return home with the following       Equipment Recommendations None recommended by PT  Recommendations for Other Services       Functional Status Assessment Patient has not had a recent decline in their functional status     Precautions / Restrictions Precautions Precautions: Other (comment) Precaution Comments: R hepatic drain Restrictions Weight Bearing Restrictions: No      Mobility  Bed Mobility Overal bed mobility: Independent                  Transfers Overall transfer level: Independent                      Ambulation/Gait Ambulation/Gait assistance: Independent Gait Distance (Feet): 100  Feet Assistive device: IV Pole Gait Pattern/deviations: WFL(Within Functional Limits) Gait velocity: WNL     General Gait Details: steady, no loss of balance; pt did not want to attempt ambulating without holding IV pole  Stairs            Wheelchair Mobility    Modified Rankin (Stroke Patients Only)       Balance Overall balance assessment: Modified Independent                                           Pertinent Vitals/Pain Pain Assessment Pain Assessment: 0-10 Pain Score: 5  Pain Location: hepatic drain site Pain Descriptors / Indicators: Aching Pain Intervention(s): Limited activity within patient's tolerance, Monitored during session, Premedicated before session    Home Living Family/patient expects to be discharged to:: Private residence Living Arrangements: Spouse/significant other Available Help at Discharge: Family;Available 24 hours/day   Home Access: Stairs to enter Entrance Stairs-Rails: Can reach both;Left;Right Entrance Stairs-Number of Steps: 4   Home Layout: One level Home Equipment: Rollator (4 wheels) Additional Comments: sister to assist at home    Prior Function Prior Level of Function : Independent/Modified Independent             Mobility Comments: walked without AD ADLs Comments: independent     Hand Dominance        Extremity/Trunk Assessment   Upper Extremity Assessment Upper Extremity Assessment: Overall WFL for tasks assessed    Lower Extremity  Assessment Lower Extremity Assessment: Overall WFL for tasks assessed    Cervical / Trunk Assessment Cervical / Trunk Assessment: Normal  Communication   Communication: No difficulties  Cognition Arousal/Alertness: Awake/alert Behavior During Therapy: WFL for tasks assessed/performed Overall Cognitive Status: Within Functional Limits for tasks assessed                                          General Comments      Exercises      Assessment/Plan    PT Assessment Patient does not need any further PT services  PT Problem List         PT Treatment Interventions      PT Goals (Current goals can be found in the Care Plan section)  Acute Rehab PT Goals PT Goal Formulation: All assessment and education complete, DC therapy    Frequency       Co-evaluation               AM-PAC PT "6 Clicks" Mobility  Outcome Measure Help needed turning from your back to your side while in a flat bed without using bedrails?: None Help needed moving from lying on your back to sitting on the side of a flat bed without using bedrails?: None Help needed moving to and from a bed to a chair (including a wheelchair)?: None Help needed standing up from a chair using your arms (e.g., wheelchair or bedside chair)?: None Help needed to walk in hospital room?: None Help needed climbing 3-5 steps with a railing? : None 6 Click Score: 24    End of Session   Activity Tolerance: Patient tolerated treatment well Patient left: in bed;with call bell/phone within reach;with family/visitor present Nurse Communication: Mobility status      Time: 5974-1638 PT Time Calculation (min) (ACUTE ONLY): 8 min   Charges:   PT Evaluation $PT Eval Low Complexity: 1 Low         Philomena Doheny PT 01/17/2022  Acute Rehabilitation Services Pager [phone] Office [phone]

## 2022-01-17 NOTE — Progress Notes (Addendum)
[facility] PROGRESS NOTE    Progress Note  Melanie Alvarez  BWI:[id number] DOB: Jan 15, 1956 DOA: 01/15/2022 PCP: Flossie Buffy, NP     Brief Narrative:   Melanie Alvarez is an 66 y.o. 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  She relates that when she belches or passes gas or abdominal pain is better.,  Has not had a bowel movement she relates in 1 to 2 weeks and has not vomited, chest x-ray in the ED shows small bilateral effusions and a large hepatic lesion of approximately 10 cm   Assessment/Plan:   Hepatic abscess: CT of abdomen and pelvis showed a 9 x 9 air-fluid collection in the central portion of the liver concerning for hepatic abscess. Infectious diseases been consulted. IR consulted drain was placed is put out about 120 cc of purulent material. Has remained afebrile. Continue Rocephin and Flagyl. Blood cultures are negative till date. Aspirate cultures have been sent. Appreciate IDs assistance.  Pancreatic cancer: Follow-up with Dr. Marin Olp for future treatment.  Portal vein thrombosis: Eliquis on hold, IR to dictate when to start Eliquis.  Constipation: Continue MiraLAX p.o. twice daily.  Dyspnea on exertion: Of unclear etiology may be related to ascites. CT PE small bilateral effusions unlikely to be contributing. BNP is mildly elevated, 2D echo EF of 63% grade 1 diastolic heart failure. Appears euvolemic on physical exam she was hyponatremic and hypochloremic  Hypovolemic hyponatremia: Improving with fluid restriction basic metabolic panels pending this morning.  Diabetes mellitus type 2 not on insulin: Hold  metformin and glipizide, continue sliding scale.  Hyperlipidemia: Continue statins.  Chronic pancreatic insufficiency: Continue Creon.  Essential hypertension: Pressures well controlled, continue Norvasc and Avapro.  Morbid Obesity: Counseled.   DVT prophylaxis: none Family Communication:none Status is: Observation The patient will require care spanning > 2 midnights and should be moved to inpatient because: Hepatic abscess requiring IV antibiotics which will need aspiration and drainage    Code Status:     Code Status Orders  (From admission, onward)           Start     Ordered   01/15/22 1903  Do not attempt resuscitation (DNR)  Continuous       Question Answer Comment  In the event of cardiac or respiratory ARREST Do not call a "code blue"   In the event of cardiac or respiratory ARREST Do not perform Intubation, CPR, defibrillation or ACLS   In the event of cardiac or respiratory ARREST Use medication by any route, position, wound care, and other measures to relive pain and suffering. May use oxygen, suction and manual treatment of airway obstruction as needed for comfort.      01/15/22 1904           Code Status History     Date Active Date Inactive Code Status Order ID Comments User Context   09/20/2019 2011 09/26/2019 1745 Full Code [id number]  Jani Gravel, MD ED         IV Access:   Peripheral IV   Procedures and diagnostic studies:   CT Angio Chest PE W and/or Wo Contrast  Result Date: 01/15/2022 CLINICAL DATA:  Shortness of breath with sensation of "feeling funny" over the  last couple days. History of pancreatic cancer. Clinical concern for pulmonary embolism. EXAM: CT ANGIOGRAPHY CHEST WITH CONTRAST TECHNIQUE: Multidetector CT imaging of the chest was performed using the standard protocol during bolus administration of intravenous contrast. Multiplanar CT image reconstructions and MIPs were obtained to evaluate the vascular anatomy. RADIATION DOSE  REDUCTION: This exam was performed according to the departmental dose-optimization program which includes automated exposure control, adjustment of the mA and/or kV according to patient size and/or use of iterative reconstruction technique. CONTRAST:  67m OMNIPAQUE IOHEXOL 350 MG/ML SOLN COMPARISON:  Chest radiographs 01/15/2022. CTs of the chest, abdomen and pelvis 12/21/2020 and CT of the abdomen and pelvis 12/13/2021. FINDINGS: Cardiovascular: The pulmonary arteries are well opacified with contrast to the level of the subsegmental branches. There is no evidence of acute pulmonary embolism. Right IJ Port-A-Cath extends into the right atrium. There is atherosclerosis of the aorta, great vessels and coronary arteries. The heart is mildly enlarged. No significant pericardial fluid. Mediastinum/Nodes: No enlarged mediastinal, hilar or axillary lymph nodes. Mildly prominent mediastinal lymph nodes are likely reactive. The thyroid gland, trachea and esophagus demonstrate no significant findings. Lungs/Pleura: New small left greater than right pleural effusions. Mild dependent atelectasis in both lungs. No confluent airspace opacity, suspicious pulmonary nodule or pneumothorax. Upper abdomen: Interval development of a large collection of air and gas within the porta hepatis/gastrohepatic ligament, measuring approximately 10.0 x 7.4 x 7.8 cm. This lies superior to metallic biliary stent. There is increased pneumobilia. No free intraperitoneal air or other extraluminal fluid collection identified in the upper abdomen. Musculoskeletal/Chest wall: Chronic glenohumeral arthropathy, right greater than left. Mild thoracic spondylosis. No acute osseous findings or chest wall masses. Review of the MIP images confirms the above findings. IMPRESSION: 1. No evidence of acute pulmonary embolism. 2. New small bilateral pleural effusions with mild atelectasis at both lung bases. 3. New large collection of gas and air within the porta  hepatis/gastrohepatic ligament compared with abdominal CT of last month. This could reflect an abscess or contained bowel perforation. Given venous thrombosis on prior abdominal CT, this could reflect cavitation of an infarct. Recommend further evaluation with dedicated CT of the abdomen and pelvis to include enteric contrast. 4. These results were called by telephone at the time of interpretation on 01/15/2022 at 1:11 pm to provider ADAM CURATOLO , who verbally acknowledged these results. Electronically Signed   By: WRichardean SaleM.D.   On: 01/15/2022 13:12   CT ABDOMEN PELVIS W CONTRAST  Result Date: 01/15/2022 CLINICAL DATA:  Acute generalized abdominal pain. History of pancreatic cancer. EXAM: CT ABDOMEN AND PELVIS WITH CONTRAST TECHNIQUE: Multidetector CT imaging of the abdomen and pelvis was performed using the standard protocol following bolus administration of intravenous contrast. RADIATION DOSE REDUCTION: This exam was performed according to the departmental dose-optimization program which includes automated exposure control, adjustment of the mA and/or kV according to patient size and/or use of iterative reconstruction technique. CONTRAST:  1053mOMNIPAQUE IOHEXOL 300 MG/ML  SOLN COMPARISON:  December 13, 2021. FINDINGS: Lower chest: Minimal bilateral posterior basilar subsegmental atelectasis is noted. Hepatobiliary: Hepatic pneumobilia is noted most likely due to common bile duct stent. There is interval development of 9.8 x 9.2 cm air-fluid collection in the central portion of the liver concerning for hepatic abscess. No gallstones are noted, although pneumobilia has extended into the gallbladder lumen. Pancreas: There is again noted pancreatic head mass that surrounds the common bile duct, and appears to be resulting in occlusion of the superior mesenteric and  splenic veins. Severe pancreatic ductal dilatation is noted secondary to this tumor. Spleen: Normal in size without focal abnormality.  Adrenals/Urinary Tract: Adrenal glands are unremarkable. No hydronephrosis or renal obstruction is noted. No renal or ureteral calculi are noted. 1.4 cm partially enhancing exophytic mass is seen arising from lower pole of right kidney which was present on prior exam and concerning for possible neoplasm. Bladder is unremarkable. Stomach/Bowel: Stomach is within normal limits. Appendix appears normal. No evidence of bowel wall thickening, distention, or inflammatory changes. Vascular/Lymphatic: Aortic atherosclerosis. Mildly enlarged periaortic adenopathy is noted concerning for metastatic disease. Reproductive: Uterus and bilateral adnexa are unremarkable. Other: No hernia is noted.  Mild ascites is noted in the pelvis. Musculoskeletal: No acute or significant osseous findings. IMPRESSION: Interval development of 9.8 x 9.2 cm air-fluid collection in the central portion of the liver concerning for hepatic abscess. There is again noted pancreatic head mass surrounds the common bile duct, and appears to be resulting in occlusion of the superior mesenteric and splenic veins as noted on prior exam. Severe pancreatic ductal dilatation is noted. Hepatic pneumobilia is noted secondary to common bile duct stent. 1.4 cm partially enhancing exophytic mass is seen arising from lower pole right kidney concerning for possible neoplasm or malignancy. Mildly enlarged periaortic adenopathy is noted concerning for metastatic disease. Mild ascites is noted in the pelvis. Aortic Atherosclerosis (ICD10-I70.0). Electronically Signed   By: Marijo Conception M.D.   On: 01/15/2022 16:02   DG Chest Portable 1 View  Result Date: 01/15/2022 CLINICAL DATA:  Short of breath EXAM: PORTABLE CHEST 1 VIEW COMPARISON:  09/19/2014 FINDINGS: Heart size mildly enlarged. Negative for heart failure or edema. Mild left lower lobe atelectasis/infiltrate. Right lung clear. No effusion. Port-A-Cath tip in the lower SVC at the cavoatrial junction  Degenerative change in both shoulders right greater than left IMPRESSION: Mild left lower lobe airspace disease likely atelectasis Port-A-Cath tip at the cavoatrial junction. Electronically Signed   By: Franchot Gallo M.D.   On: 01/15/2022 11:15   ECHOCARDIOGRAM COMPLETE  Result Date: 01/16/2022    ECHOCARDIOGRAM REPORT   Patient Name:   Melanie Alvarez Date of Exam: 01/16/2022 Medical Rec #:  [id number]         Height:       63.5 in Accession #:    [id number]        Weight:       189.0 lb Date of Birth:  Aug 15, 1956         BSA:          1.899 m Patient Age:    62 years          BP:           128/70 mmHg Patient Gender: F                 HR:           100 bpm. Exam Location:  Inpatient Procedure: 2D Echo, Cardiac Doppler, Color Doppler and Intracardiac            Opacification Agent Indications:    I50.40* Unspecified combined systolic (congestive) and diastolic                 (congestive) heart failure  History:        Patient has no prior history of Echocardiogram examinations.                 Signs/Symptoms:Shortness of Breath, Dyspnea and  Dizziness/Lightheadedness; Risk Factors:Hypertension, Diabetes                 and Dyslipidemia. Cancer. ETOH.  Sonographer:    Roseanna Rainbow RDCS Referring Phys: [id number] [facility]  Sonographer Comments: Technically difficult study due to poor echo windows. Patient could not tolerate pressure from probe. Recent drain placed in subcostal region. IMPRESSIONS  1. Left ventricular ejection fraction, by estimation, is 40 to 45%. The left ventricle has mildly decreased function. The left ventricle demonstrates global hypokinesis. Left ventricular diastolic parameters are consistent with Grade I diastolic dysfunction (impaired relaxation). Elevated left ventricular end-diastolic pressure.  2. Right ventricular systolic function is normal. The right ventricular size is normal.  3. The mitral valve is normal in structure. Trivial mitral valve regurgitation. No  evidence of mitral stenosis.  4. The aortic valve is tricuspid. Aortic valve regurgitation is not visualized. Mild aortic valve stenosis. Aortic valve area, by VTI measures 1.62 cm. Aortic valve mean gradient measures 11.0 mmHg. Aortic valve Vmax measures 2.28 m/s.  5. The inferior vena cava is normal in size with greater than 50% respiratory variability, suggesting right atrial pressure of 3 mmHg. FINDINGS  Left Ventricle: Left ventricular ejection fraction, by estimation, is 40 to 45%. The left ventricle has mildly decreased function. The left ventricle demonstrates global hypokinesis. The left ventricular internal cavity size was normal in size. There is  no left ventricular hypertrophy. Left ventricular diastolic parameters are consistent with Grade I diastolic dysfunction (impaired relaxation). Elevated left ventricular end-diastolic pressure. Right Ventricle: The right ventricular size is normal. No increase in right ventricular wall thickness. Right ventricular systolic function is normal. Left Atrium: Left atrial size was normal in size. Right Atrium: Right atrial size was normal in size. Pericardium: There is no evidence of pericardial effusion. Mitral Valve: The mitral valve is normal in structure. Trivial mitral valve regurgitation. No evidence of mitral valve stenosis. MV peak gradient, 11.8 mmHg. The mean mitral valve gradient is 5.0 mmHg. Tricuspid Valve: The tricuspid valve is normal in structure. Tricuspid valve regurgitation is trivial. No evidence of tricuspid stenosis. Aortic Valve: The aortic valve is tricuspid. Aortic valve regurgitation is not visualized. Mild aortic stenosis is present. Aortic valve mean gradient measures 11.0 mmHg. Aortic valve peak gradient measures 20.7 mmHg. Aortic valve area, by VTI measures 1.62 cm. Pulmonic Valve: The pulmonic valve was normal in structure. Pulmonic valve regurgitation is not visualized. No evidence of pulmonic stenosis. Aorta: The aortic root is  normal in size and structure. Venous: The inferior vena cava was not well visualized. The inferior vena cava is normal in size with greater than 50% respiratory variability, suggesting right atrial pressure of 3 mmHg. IAS/Shunts: No atrial level shunt detected by color flow Doppler.  LEFT VENTRICLE PLAX 2D LVIDd:         4.50 cm     Diastology LVIDs:         3.20 cm     LV e' medial:    4.57 cm/s LV PW:         1.00 cm     LV E/e' medial:  18.5 LV IVS:        1.00 cm     LV e' lateral:   5.00 cm/s LVOT diam:     1.70 cm     LV E/e' lateral: 16.9 LV SV:         54 LV SV Index:   28 LVOT Area:     2.27 cm  LV  Volumes (MOD) LV vol d, MOD A2C: 90.9 ml LV vol d, MOD A4C: 80.6 ml LV vol s, MOD A2C: 44.1 ml LV vol s, MOD A4C: 51.8 ml LV SV MOD A2C:     46.8 ml LV SV MOD A4C:     80.6 ml LV SV MOD BP:      37.5 ml RIGHT VENTRICLE RV S prime:     16.20 cm/s TAPSE (M-mode): 2.0 cm LEFT ATRIUM             Index        RIGHT ATRIUM          Index LA diam:        3.40 cm 1.79 cm/m   RA Area:     9.83 cm LA Vol (A2C):   29.0 ml 15.27 ml/m  RA Volume:   18.20 ml 9.58 ml/m LA Vol (A4C):   33.1 ml 17.43 ml/m LA Biplane Vol: 31.7 ml 16.69 ml/m  AORTIC VALVE AV Area (Vmax):    1.63 cm AV Area (Vmean):   1.59 cm AV Area (VTI):     1.62 cm AV Vmax:           227.67 cm/s AV Vmean:          151.667 cm/s AV VTI:            0.330 m AV Peak Grad:      20.7 mmHg AV Mean Grad:      11.0 mmHg LVOT Vmax:         163.00 cm/s LVOT Vmean:        106.000 cm/s LVOT VTI:          0.236 m LVOT/AV VTI ratio: 0.72  AORTA Ao Root diam: 2.60 cm Ao Asc diam:  3.00 cm MITRAL VALVE                TRICUSPID VALVE MV Area (PHT): 5.52 cm     TR Peak grad:   10.0 mmHg MV Area VTI:   1.65 cm     TR Vmax:        158.00 cm/s MV Peak grad:  11.8 mmHg MV Mean grad:  5.0 mmHg     SHUNTS MV Vmax:       1.72 m/s     Systemic VTI:  0.24 m MV Vmean:      96.8 cm/s    Systemic Diam: 1.70 cm MV Decel Time: 137 msec MV E velocity: 84.33 cm/s MV A velocity: 156.33  cm/s MV E/A ratio:  0.54 Skeet Latch MD Electronically signed by Skeet Latch MD Signature Date/Time: 01/16/2022/4:14:37 PM    Final    CT IMAGE GUIDED DRAINAGE BY PERCUTANEOUS CATHETER  Result Date: 01/17/2022 CLINICAL DATA:  Pancreatic carcinoma status post biliary stenting. 9.8 cm gas and fluid collection in the porta hepatis EXAM: CT GUIDED DRAINAGE OF ABDOMINAL ABSCESS ANESTHESIA/SEDATION: Intravenous Fentanyl 185mg and Versed '2mg'$  were administered as conscious sedation during continuous monitoring of the patient's level of consciousness and physiological / cardiorespiratory status by the radiology RN, with a total moderate sedation time of 20 minutes. PROCEDURE: The procedure, risks, benefits, and alternatives were explained to the patient. Questions regarding the procedure were encouraged and answered. the patient understands and consents to the procedure. select axial scans through the abdomen were obtained. the collection was localized an appropriate skin entry site was determined and marked. The operative field was prepped with chlorhexidinein a sterile fashion, and a sterile drape was applied  covering the operative field. A sterile gown and sterile gloves were used for the procedure. Local anesthesia was provided with 1% Lidocaine. Under CT fluoroscopic guidance, 18 gauge needle was advanced into the collection. Amplatz guidewire advanced easily. CT confirmed appropriate positioning. Tract dilated with 10 French dilator to facilitate placement of a 10 French pigtail drain catheter, formed centrally within the dependent aspect of the collection. 10 mL of thin purulent material were aspirated, sent for Gram stain and culture. Catheter secured externally 0 Prolene suture and StatLock and placed to gravity drain bag. The patient tolerated the procedure well. COMPLICATIONS: None immediate FINDINGS: Large gas and fluid collection in the porta hepatis was localized. 10 French drain catheter placed as  above. 10 mL of purulent aspirate sent for Gram stain and culture. IMPRESSION: 1. Technically successful subhepatic abscess drain catheter placement with CT guidance. RADIATION DOSE REDUCTION: This exam was performed according to the departmental dose-optimization program which includes automated exposure control, adjustment of the mA and/or kV according to patient size and/or use of iterative reconstruction technique. Electronically Signed   By: Lucrezia Europe M.D.   On: 01/17/2022 07:37     Medical Consultants:   None.   Subjective:    Melanie Alvarez relates her breathing and her abdominal pain is better.  Objective:    Vitals:   01/16/22 1146 01/16/22 1340 01/16/22 2019 01/17/22 0511  BP: 120/68 115/63 129/67 129/67  Pulse: 100 (!) 104 (!) 103 98  Resp: (!) '26 16 18 18  '$ Temp:  98.8 F (37.1 C) 98.8 F (37.1 C) 98 F (36.7 C)  TempSrc:  Oral Oral Oral  SpO2: 96% 95% 95% 94%  Weight:      Height:       SpO2: 94 % O2 Flow Rate (L/min): 2 L/min   Intake/Output Summary (Last 24 hours) at 01/17/2022 0929 Last data filed at 01/17/2022 0800 Gross per 24 hour  Intake 1636.8 ml  Output 460 ml  Net 1176.8 ml    Filed Weights   01/15/22 1116  Weight: 85.7 kg    Exam: General exam: In no acute distress. Respiratory system: Good air movement and clear to auscultation. Cardiovascular system: S1 & S2 heard, RRR. No JVD. Gastrointestinal system: Abdomen is nondistended, soft and nontender.  Extremities: No pedal edema. Skin: No rashes, lesions or ulcers Psychiatry: Judgement and insight appear normal. Mood & affect appropriate.   Data Reviewed:    Labs: Basic Metabolic Panel: Recent Labs  Lab 01/15/22 1116 01/16/22 0500  NA 128* 131*  K 4.1 3.9  CL 92* 97*  CO2 26 26  GLUCOSE 283* 100*  BUN 13 9  CREATININE 0.67 0.47  CALCIUM 8.1* 8.0*    GFR Estimated Creatinine Clearance: 74.5 mL/min (by C-G formula based on SCr of 0.47 mg/dL). Liver Function  Tests: Recent Labs  Lab 01/15/22 1116 01/16/22 0500  AST 41 39  ALT 36 32  ALKPHOS 105 103  BILITOT 0.8 0.7  PROT 6.9 6.5  ALBUMIN 2.2* 2.0*    Recent Labs  Lab 01/15/22 1116  LIPASE 23    No results for input(s): AMMONIA in the last 168 hours. Coagulation profile Recent Labs  Lab 01/16/22 1325  INR 3.7*   COVID-19 Labs  No results for input(s): DDIMER, FERRITIN, LDH, CRP in the last 72 hours.  Lab Results  Component Value Date   [place] NEGATIVE 01/15/2020   [place] NEGATIVE 09/20/2019   [place] Not Detected 07/06/2019    CBC: Recent Labs  Lab 01/15/22 1116 01/16/22 0500  WBC 9.1 9.9  NEUTROABS 6.5  --   HGB 11.8* 11.0*  HCT 36.5 34.5*  MCV 85.1 86.0  PLT 538* 422*    Cardiac Enzymes: No results for input(s): CKTOTAL, CKMB, CKMBINDEX, TROPONINI in the last 168 hours. BNP (last 3 results) No results for input(s): PROBNP in the last 8760 hours. CBG: Recent Labs  Lab 01/16/22 0730 01/16/22 1343 01/16/22 1654 01/16/22 2113 01/17/22 0747  GLUCAP 112* 116* 107* 100* 116*    D-Dimer: No results for input(s): DDIMER in the last 72 hours. Hgb A1c: No results for input(s): HGBA1C in the last 72 hours. Lipid Profile: No results for input(s): CHOL, HDL, LDLCALC, TRIG, CHOLHDL, LDLDIRECT in the last 72 hours. Thyroid function studies: No results for input(s): TSH, T4TOTAL, T3FREE, THYROIDAB in the last 72 hours.  Invalid input(s): FREET3 Anemia work up: No results for input(s): VITAMINB12, FOLATE, FERRITIN, TIBC, IRON, RETICCTPCT in the last 72 hours. Sepsis Labs: Recent Labs  Lab 01/15/22 1116 01/16/22 0500  WBC 9.1 9.9    Microbiology Recent Results (from the past 240 hour(s))  Aerobic/Anaerobic Culture w Gram Stain (surgical/deep wound)     Status: None (Preliminary result)   Collection Time: 01/16/22 11:50 AM   Specimen: Abscess  Result Value Ref Range Status   Specimen Description   Final    ABSCESS ABDOMINAL Performed  at [facility] [address] [place], [place] [address]    Special Requests   Final    NONE Performed at [facility] [place] [address], [place], [place] [address]    Gram Stain   Final    NO WBC SEEN ABUNDANT GRAM POSITIVE COCCI ABUNDANT GRAM POSITIVE RODS ABUNDANT GRAM NEGATIVE RODS Performed at [facility], [place] [address], [place], [place] [address]    Culture PENDING  Incomplete   Report Status PENDING  Incomplete     Medications:    amLODipine  10 mg Oral QHS   atorvastatin  20 mg Oral QHS   Chlorhexidine Gluconate Cloth  6 each Topical Daily   feeding supplement  237 mL Oral BID BM   [START ON 01/18/2022] fentaNYL  1 patch Transdermal Q72H   fluconazole  200 mg Oral Daily   heparin  5,000 Units Subcutaneous Q8H   insulin aspart  0-15 Units Subcutaneous TID WC   insulin aspart  0-5 Units Subcutaneous QHS   irbesartan  37.5 mg Oral Daily   lipase/protease/amylase  72,000 Units Oral TID AC   multivitamin with minerals  1 tablet Oral Daily   pantoprazole  40 mg Oral Daily   polyethylene glycol  17 g Oral BID   sodium chloride flush  5 mL Intracatheter Q8H   Continuous Infusions:  sodium chloride 75 mL/hr at 01/17/22 0513   cefTRIAXone (ROCEPHIN)  IV Stopped (01/16/22 2139)   metronidazole Stopped (01/16/22 2214)      LOS: 1 day   Charlynne Cousins  [facility]  01/17/2022, 9:29 AM

## 2022-01-17 NOTE — Progress Notes (Signed)
Id brief note  Hepatic lesion fluid with gpc, gnr, gpr on gram stain Stable on ceftriaxone flagyl  Cytology order added yesterday and epic is showing it collected   A/p Hepatic fluid collection in setting recurrent pancreatic adeno and pvt. Infection vs necrotic cancer burden vs both   -f/u cx and cytology -continue current antibiotics

## 2022-01-18 ENCOUNTER — Inpatient Hospital Stay (HOSPITAL_COMMUNITY): Payer: BC Managed Care – PPO

## 2022-01-18 DIAGNOSIS — G894 Chronic pain syndrome: Secondary | ICD-10-CM | POA: Diagnosis not present

## 2022-01-18 DIAGNOSIS — I10 Essential (primary) hypertension: Secondary | ICD-10-CM | POA: Diagnosis not present

## 2022-01-18 DIAGNOSIS — K5900 Constipation, unspecified: Secondary | ICD-10-CM | POA: Diagnosis not present

## 2022-01-18 DIAGNOSIS — K769 Liver disease, unspecified: Secondary | ICD-10-CM | POA: Diagnosis not present

## 2022-01-18 LAB — CBC WITH DIFFERENTIAL/PLATELET
Abs Immature Granulocytes: 0.39 10*3/uL — ABNORMAL HIGH (ref 0.00–0.07)
Basophils Absolute: 0.1 10*3/uL (ref 0.0–0.1)
Basophils Relative: 1 %
Eosinophils Absolute: 0.1 10*3/uL (ref 0.0–0.5)
Eosinophils Relative: 1 %
HCT: 33.7 % — ABNORMAL LOW (ref 36.0–46.0)
Hemoglobin: 11.1 g/dL — ABNORMAL LOW (ref 12.0–15.0)
Immature Granulocytes: 4 %
Lymphocytes Relative: 12 %
Lymphs Abs: 1.2 10*3/uL (ref 0.7–4.0)
MCH: 28 pg (ref 26.0–34.0)
MCHC: 32.9 g/dL (ref 30.0–36.0)
MCV: 84.9 fL (ref 80.0–100.0)
Monocytes Absolute: 0.9 10*3/uL (ref 0.1–1.0)
Monocytes Relative: 9 %
Neutro Abs: 6.9 10*3/uL (ref 1.7–7.7)
Neutrophils Relative %: 73 %
Platelets: 455 10*3/uL — ABNORMAL HIGH (ref 150–400)
RBC: 3.97 MIL/uL (ref 3.87–5.11)
RDW: 16.4 % — ABNORMAL HIGH (ref 11.5–15.5)
WBC: 9.5 10*3/uL (ref 4.0–10.5)
nRBC: 0 % (ref 0.0–0.2)

## 2022-01-18 LAB — COMPREHENSIVE METABOLIC PANEL
ALT: 28 U/L (ref 0–44)
AST: 32 U/L (ref 15–41)
Albumin: 2.1 g/dL — ABNORMAL LOW (ref 3.5–5.0)
Alkaline Phosphatase: 95 U/L (ref 38–126)
Anion gap: 9 (ref 5–15)
BUN: 5 mg/dL — ABNORMAL LOW (ref 8–23)
CO2: 26 mmol/L (ref 22–32)
Calcium: 8.1 mg/dL — ABNORMAL LOW (ref 8.9–10.3)
Chloride: 99 mmol/L (ref 98–111)
Creatinine, Ser: 0.48 mg/dL (ref 0.44–1.00)
GFR, Estimated: >60 mL/min (ref >60)
Glucose, Bld: 163 mg/dL — ABNORMAL HIGH (ref 70–99)
Potassium: 3.6 mmol/L (ref 3.5–5.1)
Sodium: 134 mmol/L — ABNORMAL LOW (ref 135–145)
Total Bilirubin: 0.7 mg/dL (ref 0.3–1.2)
Total Protein: 6.7 g/dL (ref 6.5–8.1)

## 2022-01-18 LAB — GLUCOSE, CAPILLARY
Glucose-Capillary: 172 mg/dL — ABNORMAL HIGH (ref 70–99)
Glucose-Capillary: 199 mg/dL — ABNORMAL HIGH (ref 70–99)
Glucose-Capillary: 181 mg/dL — ABNORMAL HIGH (ref 70–99)
Glucose-Capillary: 94 mg/dL (ref 70–99)

## 2022-01-18 IMAGING — DX DG CHEST 1V PORT
1 series · 1 of 1 positions shown · non-contrast
Comparison: [DATE] chest radiograph.

CLINICAL DATA: Left chest discomfort and dyspnea

EXAM:
PORTABLE CHEST 1 VIEW

[chest ap]
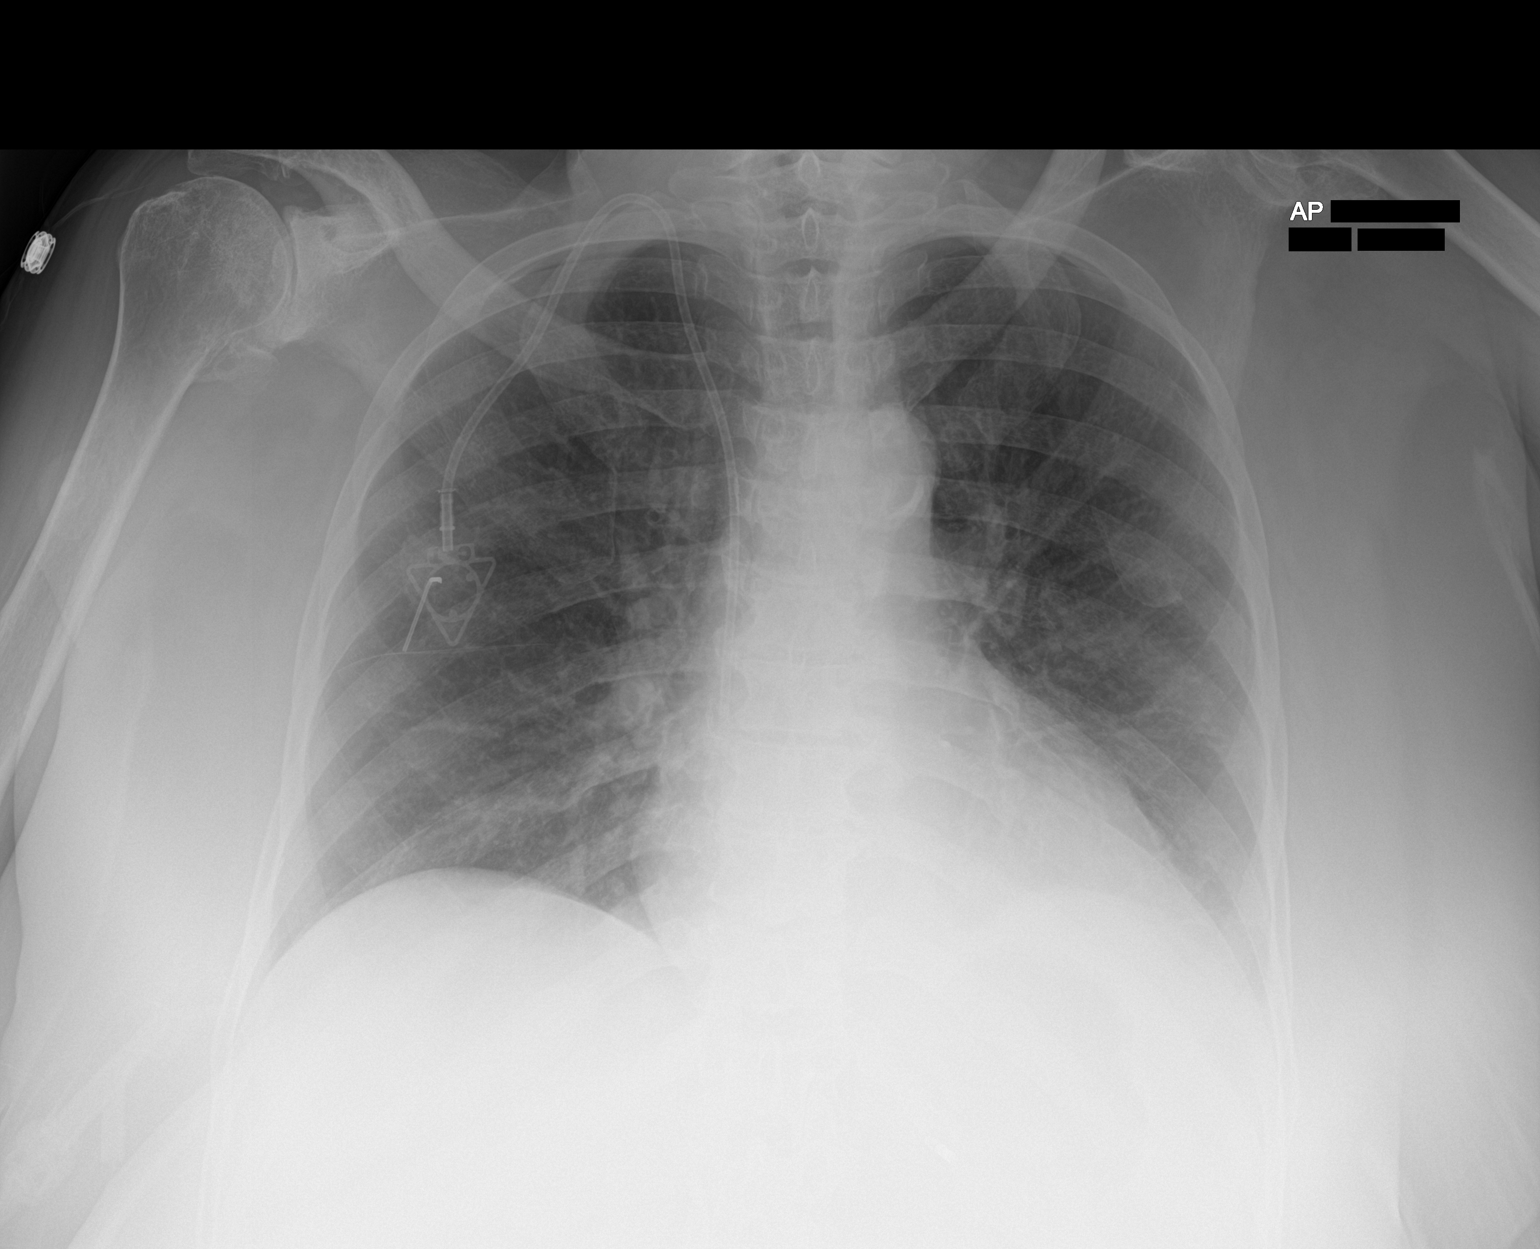

[1 of 1 positions shown; findings below may reference images not displayed]

FINDINGS: Right internal jugular Port-A-Cath terminates at the cavoatrial
junction. Stable cardiomediastinal silhouette with normal heart
size. No pneumothorax. No pleural effusion. No pulmonary edema.
Faint hazy left lung base opacity, similar, favor atelectasis.
IMPRESSION: Faint hazy left lung base opacity, favor atelectasis.

## 2022-01-18 MED ORDER — ALUM & MAG HYDROXIDE-SIMETH 200-200-20 MG/5ML PO SUSP
30.0000 mL | ORAL | Status: DC | PRN
  Administered 2022-01-18 – 2022-01-20 (×4): 30 mL via ORAL
  Filled 2022-01-18 (×4): qty 30

## 2022-01-18 MED ORDER — ATORVASTATIN CALCIUM 20 MG PO TABS
20.0000 mg | ORAL_TABLET | Freq: Every day | ORAL | Status: DC
  Administered 2022-01-18 – 2022-01-21 (×4): 20 mg via ORAL
  Filled 2022-01-18 (×4): qty 1

## 2022-01-18 MED ORDER — FLUCONAZOLE 100 MG PO TABS
100.0000 mg | ORAL_TABLET | Freq: Every day | ORAL | Status: DC
  Administered 2022-01-18 – 2022-01-22 (×5): 100 mg via ORAL
  Filled 2022-01-18 (×5): qty 1

## 2022-01-18 MED ORDER — SODIUM CHLORIDE 0.9 % IV SOLN
3.0000 g | Freq: Four times a day (QID) | INTRAVENOUS | Status: DC
  Administered 2022-01-18 – 2022-01-22 (×14): 3 g via INTRAVENOUS
  Filled 2022-01-18 (×17): qty 8

## 2022-01-18 MED ORDER — INSULIN DETEMIR 100 UNIT/ML ~~LOC~~ SOLN
5.0000 [IU] | Freq: Two times a day (BID) | SUBCUTANEOUS | Status: DC
  Administered 2022-01-18 – 2022-01-22 (×9): 5 [IU] via SUBCUTANEOUS
  Filled 2022-01-18 (×10): qty 0.05

## 2022-01-18 MED ORDER — APIXABAN 2.5 MG PO TABS
5.0000 mg | ORAL_TABLET | Freq: Two times a day (BID) | ORAL | Status: DC
  Administered 2022-01-18 – 2022-01-22 (×9): 5 mg via ORAL
  Filled 2022-01-18 (×9): qty 2

## 2022-01-18 MED ORDER — LACTATED RINGERS IV SOLN
INTRAVENOUS | Status: AC
Start: 2022-01-19 — End: 2022-01-20

## 2022-01-18 MED ORDER — PANTOPRAZOLE SODIUM 40 MG PO TBEC
40.0000 mg | DELAYED_RELEASE_TABLET | Freq: Two times a day (BID) | ORAL | Status: DC
  Administered 2022-01-18 – 2022-01-22 (×9): 40 mg via ORAL
  Filled 2022-01-18 (×9): qty 1

## 2022-01-18 MED ORDER — SODIUM CHLORIDE 0.9 % IV BOLUS: 500.0000 mL | Freq: Once | INTRAVENOUS | Status: DC

## 2022-01-18 NOTE — Progress Notes (Signed)
   01/18/22 1210  Orthostatic Lying   BP- Lying 134/73  Pulse- Lying 90  Orthostatic Sitting  BP- Sitting 128/75  Pulse- Sitting 99  Orthostatic Standing at 0 minutes  BP- Standing at 0 minutes 126/86  Pulse- Standing at 0 minutes 104  Orthostatic Standing at 3 minutes  BP- Standing at 3 minutes 109/85  Pulse- Standing at 3 minutes 108

## 2022-01-18 NOTE — Progress Notes (Signed)
Diagnosis: Liver abscess  Culture Result: ecoli pan sensitive; perc drain 5/31 by ir   Can continue iv abx here (cefazolin with po flagyl) and on discharge given good drainage so far dc with amox-clav for 6 weeks, if ct showed significant improvement    ----------- -change abx to cefazolin and po flagyl -repeat ct tomorrow Saturday -if ct showed good improvement of abscess to < 5cm, could discharge from id standpoint on 6 weeks of amox-clav until 7/14 -id clinic f/u dr Cambrie Sonnenfeld on 7/5 at [facility] Follow Up Appt: Dr Josejuan Hoaglin; 7/05 at [facility]  @  [facility] [place], [place], [place] [address] Phone: [phone]

## 2022-01-18 NOTE — Progress Notes (Signed)
[facility] PROGRESS NOTE    Progress Note  [name]  VQM:[id number] DOB: 07-28-1956 DOA: 01/15/2022 PCP: Flossie Buffy, NP     Brief Narrative:   Melanie Alvarez is an 66 y.o. 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  CT scan of the abdomen pelvis showed possible abscess IR was consulted to place a drain and 120 mL of purulent material are draining.   Assessment/Plan:   Hepatic abscess: CT of abdomen and pelvis showed a 9 x 9 concerning for active abscess, IR was consulted on 120 mL of purulent material came out. Infectious diseases been consulted. Continue Rocephin and Flagyl. Hepatic fluid Gram stain gram-positive cocci gram-negative rods. Cytology has been sent. Further management per infectious disease.  Pancreatic cancer: Follow-up with Dr. Marin Olp for future treatment.  Portal vein thrombosis: Eliquis on hold, IR relates he did receive to restart Eliquis monitor for any signs of bleeding.  Constipation: Constipation is resolved has had multiple bowel movements.  Dyspnea on exertion: Of unclear etiology may be related to ascites. CT PE small bilateral effusions unlikely to be contributing. BNP is mildly elevated, 2D echo EF of 93% grade 1 diastolic heart failure. Appears euvolemic on physical exam she was hyponatremic and hypochloremic  Hypovolemic hyponatremia: Improving with fluid restriction basic metabolic panels pending this morning.  Orthostatic hypotension: She was in physical therapy yesterday and she was fine. This morning she got out of bed and felt dizzy did not passed out. Check orthostatics we will  hydrate orally.  Diabetes mellitus type 2 not on insulin: Hold metformin and glipizide, continue sliding scale.  Hyperlipidemia: Continue statins.  Chronic pancreatic insufficiency: Continue Creon.  Essential hypertension: Pressures well controlled, continue Norvasc and Avapro.  Morbid Obesity: Counseled.   DVT prophylaxis: none Family Communication:none Status is: Observation The patient will require care spanning > 2 midnights and should be moved to inpatient because: Hepatic abscess requiring IV antibiotics which will need aspiration and drainage    Code Status:     Code Status Orders  (From admission, onward)           Start     Ordered   01/15/22 1903  Do not attempt resuscitation (DNR)  Continuous       Question Answer Comment  In the event of cardiac or respiratory ARREST Do not call a "code blue"   In the event of cardiac or respiratory ARREST Do not perform Intubation, CPR, defibrillation or ACLS   In the event of cardiac or respiratory ARREST Use medication by any route, position, wound care, and other measures to relive pain and suffering. May use oxygen, suction and manual treatment of airway obstruction as needed for comfort.      01/15/22 1904           Code Status History     Date Active Date Inactive Code Status Order ID Comments User Context   09/20/2019 2011 09/26/2019 1745 Full Code [id number]  Jani Gravel, MD ED         IV Access:   Peripheral IV   Procedures and diagnostic studies:   DG CHEST PORT 1 VIEW  Result Date: 01/18/2022 CLINICAL DATA:  Left chest discomfort and dyspnea EXAM: PORTABLE CHEST  1 VIEW COMPARISON:  01/15/2022 chest radiograph. FINDINGS: Right internal jugular Port-A-Cath terminates at the cavoatrial junction. Stable cardiomediastinal silhouette with normal heart size. No pneumothorax. No pleural effusion. No pulmonary edema. Faint hazy left lung base opacity, similar, favor atelectasis. IMPRESSION: Faint hazy left  lung base opacity, favor atelectasis. Electronically Signed   By: Ilona Sorrel M.D.   On: 01/18/2022 08:24   ECHOCARDIOGRAM COMPLETE  Result Date: 01/16/2022    ECHOCARDIOGRAM REPORT   Patient Name:   [name] Date of Exam: 01/16/2022 Medical Rec #:  [id number]         Height:       63.5 in Accession #:    [id number]        Weight:       189.0 lb Date of Birth:  10/24/1955         BSA:          1.899 m Patient Age:    27 years          BP:           128/70 mmHg Patient Gender: F                 HR:           100 bpm. Exam Location:  Inpatient Procedure: 2D Echo, Cardiac Doppler, Color Doppler and Intracardiac            Opacification Agent Indications:    I50.40* Unspecified combined systolic (congestive) and diastolic                 (congestive) heart failure  History:        Patient has no prior history of Echocardiogram examinations.                 Signs/Symptoms:Shortness of Breath, Dyspnea and                 Dizziness/Lightheadedness; Risk Factors:Hypertension, Diabetes                 and Dyslipidemia. Cancer. ETOH.  Sonographer:    Roseanna Rainbow RDCS Referring Phys: [phone] [facility]  Sonographer Comments: Technically difficult study due to poor echo windows. Patient could not tolerate pressure from probe. Recent drain placed in subcostal region. IMPRESSIONS  1. Left ventricular ejection fraction, by estimation, is 40 to 45%. The left ventricle has mildly decreased function. The left ventricle demonstrates global hypokinesis. Left ventricular diastolic parameters are consistent with Grade I diastolic dysfunction (impaired relaxation). Elevated left ventricular end-diastolic pressure.  2. Right ventricular systolic function is normal. The right ventricular size is normal.  3. The mitral valve is normal in structure. Trivial mitral valve regurgitation. No evidence of mitral stenosis.  4. The aortic valve is tricuspid. Aortic valve regurgitation is not visualized. Mild aortic valve stenosis. Aortic  valve area, by VTI measures 1.62 cm. Aortic valve mean gradient measures 11.0 mmHg. Aortic valve Vmax measures 2.28 m/s.  5. The inferior vena cava is normal in size with greater than 50% respiratory variability, suggesting right atrial pressure of 3 mmHg. FINDINGS  Left Ventricle: Left ventricular ejection fraction, by estimation, is 40 to 45%. The left ventricle has mildly decreased function. The left ventricle demonstrates global hypokinesis. The left ventricular internal cavity size was normal in size. There is  no left ventricular hypertrophy. Left ventricular diastolic parameters are consistent with Grade I diastolic dysfunction (impaired relaxation). Elevated left ventricular end-diastolic pressure. Right Ventricle: The right ventricular size is normal.  No increase in right ventricular wall thickness. Right ventricular systolic function is normal. Left Atrium: Left atrial size was normal in size. Right Atrium: Right atrial size was normal in size. Pericardium: There is no evidence of pericardial effusion. Mitral Valve: The mitral valve is normal in structure. Trivial mitral valve regurgitation. No evidence of mitral valve stenosis. MV peak gradient, 11.8 mmHg. The mean mitral valve gradient is 5.0 mmHg. Tricuspid Valve: The tricuspid valve is normal in structure. Tricuspid valve regurgitation is trivial. No evidence of tricuspid stenosis. Aortic Valve: The aortic valve is tricuspid. Aortic valve regurgitation is not visualized. Mild aortic stenosis is present. Aortic valve mean gradient measures 11.0 mmHg. Aortic valve peak gradient measures 20.7 mmHg. Aortic valve area, by VTI measures 1.62 cm. Pulmonic Valve: The pulmonic valve was normal in structure. Pulmonic valve regurgitation is not visualized. No evidence of pulmonic stenosis. Aorta: The aortic root is normal in size and structure. Venous: The inferior vena cava was not well visualized. The inferior vena cava is normal in size with greater than 50%  respiratory variability, suggesting right atrial pressure of 3 mmHg. IAS/Shunts: No atrial level shunt detected by color flow Doppler.  LEFT VENTRICLE PLAX 2D LVIDd:         4.50 cm     Diastology LVIDs:         3.20 cm     LV e' medial:    4.57 cm/s LV PW:         1.00 cm     LV E/e' medial:  18.5 LV IVS:        1.00 cm     LV e' lateral:   5.00 cm/s LVOT diam:     1.70 cm     LV E/e' lateral: 16.9 LV SV:         54 LV SV Index:   28 LVOT Area:     2.27 cm  LV Volumes (MOD) LV vol d, MOD A2C: 90.9 ml LV vol d, MOD A4C: 80.6 ml LV vol s, MOD A2C: 44.1 ml LV vol s, MOD A4C: 51.8 ml LV SV MOD A2C:     46.8 ml LV SV MOD A4C:     80.6 ml LV SV MOD BP:      37.5 ml RIGHT VENTRICLE RV S prime:     16.20 cm/s TAPSE (M-mode): 2.0 cm LEFT ATRIUM             Index        RIGHT ATRIUM          Index LA diam:        3.40 cm 1.79 cm/m   RA Area:     9.83 cm LA Vol (A2C):   29.0 ml 15.27 ml/m  RA Volume:   18.20 ml 9.58 ml/m LA Vol (A4C):   33.1 ml 17.43 ml/m LA Biplane Vol: 31.7 ml 16.69 ml/m  AORTIC VALVE AV Area (Vmax):    1.63 cm AV Area (Vmean):   1.59 cm AV Area (VTI):     1.62 cm AV Vmax:           227.67 cm/s AV Vmean:          151.667 cm/s AV VTI:            0.330 m AV Peak Grad:      20.7 mmHg AV Mean Grad:      11.0 mmHg LVOT Vmax:         163.00 cm/s LVOT  Vmean:        106.000 cm/s LVOT VTI:          0.236 m LVOT/AV VTI ratio: 0.72  AORTA Ao Root diam: 2.60 cm Ao Asc diam:  3.00 cm MITRAL VALVE                TRICUSPID VALVE MV Area (PHT): 5.52 cm     TR Peak grad:   10.0 mmHg MV Area VTI:   1.65 cm     TR Vmax:        158.00 cm/s MV Peak grad:  11.8 mmHg MV Mean grad:  5.0 mmHg     SHUNTS MV Vmax:       1.72 m/s     Systemic VTI:  0.24 m MV Vmean:      96.8 cm/s    Systemic Diam: 1.70 cm MV Decel Time: 137 msec MV E velocity: 84.33 cm/s MV A velocity: 156.33 cm/s MV E/A ratio:  0.54 Skeet Latch MD Electronically signed by Skeet Latch MD Signature Date/Time: 01/16/2022/4:14:37 PM    Final    CT  IMAGE GUIDED DRAINAGE BY PERCUTANEOUS CATHETER  Result Date: 01/17/2022 CLINICAL DATA:  Pancreatic carcinoma status post biliary stenting. 9.8 cm gas and fluid collection in the porta hepatis EXAM: CT GUIDED DRAINAGE OF ABDOMINAL ABSCESS ANESTHESIA/SEDATION: Intravenous Fentanyl 132mg and Versed '2mg'$  were administered as conscious sedation during continuous monitoring of the patient's level of consciousness and physiological / cardiorespiratory status by the radiology RN, with a total moderate sedation time of 20 minutes. PROCEDURE: The procedure, risks, benefits, and alternatives were explained to the patient. Questions regarding the procedure were encouraged and answered. the patient understands and consents to the procedure. select axial scans through the abdomen were obtained. the collection was localized an appropriate skin entry site was determined and marked. The operative field was prepped with chlorhexidinein a sterile fashion, and a sterile drape was applied covering the operative field. A sterile gown and sterile gloves were used for the procedure. Local anesthesia was provided with 1% Lidocaine. Under CT fluoroscopic guidance, 18 gauge needle was advanced into the collection. Amplatz guidewire advanced easily. CT confirmed appropriate positioning. Tract dilated with 10 French dilator to facilitate placement of a 10 French pigtail drain catheter, formed centrally within the dependent aspect of the collection. 10 mL of thin purulent material were aspirated, sent for Gram stain and culture. Catheter secured externally 0 Prolene suture and StatLock and placed to gravity drain bag. The patient tolerated the procedure well. COMPLICATIONS: None immediate FINDINGS: Large gas and fluid collection in the porta hepatis was localized. 10 French drain catheter placed as above. 10 mL of purulent aspirate sent for Gram stain and culture. IMPRESSION: 1. Technically successful subhepatic abscess drain catheter placement  with CT guidance. RADIATION DOSE REDUCTION: This exam was performed according to the departmental dose-optimization program which includes automated exposure control, adjustment of the mA and/or kV according to patient size and/or use of iterative reconstruction technique. Electronically Signed   By: DLucrezia EuropeM.D.   On: 01/17/2022 07:37     Medical Consultants:   None.   Subjective:    VFALISHA OSMENTrelates she feels lousy today dizzy upon standing  Objective:    Vitals:   01/17/22 0511 01/17/22 1513 01/17/22 2239 01/18/22 0633  BP: 129/67 136/80 140/84 140/78  Pulse: 98 (!) 105 (!) 102 99  Resp: '18 19 18 18  '$ Temp: 98 F (36.7 C) (!) 97.5 F (36.4 C) 99.1  F (37.3 C) 97.9 F (36.6 C)  TempSrc: Oral Oral Oral Oral  SpO2: 94% 96% 96% 95%  Weight:      Height:       SpO2: 95 % O2 Flow Rate (L/min): 2 L/min   Intake/Output Summary (Last 24 hours) at 01/18/2022 0920 Last data filed at 01/18/2022 0615 Gross per 24 hour  Intake 1080.06 ml  Output 925 ml  Net 155.06 ml    Filed Weights   01/15/22 1116  Weight: 85.7 kg    Exam: General exam: In no acute distress. Respiratory system: Good air movement and clear to auscultation. Cardiovascular system: S1 & S2 heard, RRR. No JVD. Gastrointestinal system: Abdomen is nondistended, soft and nontender.  Extremities: No pedal edema. Skin: No rashes, lesions or ulcers Psychiatry: Judgement and insight appear normal. Mood & affect appropriate.   Data Reviewed:    Labs: Basic Metabolic Panel: Recent Labs  Lab 01/15/22 1116 01/16/22 0500 01/17/22 1234 01/18/22 0621  NA 128* 131* 132* 134*  K 4.1 3.9 3.1* 3.6  CL 92* 97* 100 99  CO2 '26 26 24 26  '$ GLUCOSE 283* 100* 157* 163*  BUN 13 9 7* 5*  CREATININE 0.67 0.47 0.61 0.48  CALCIUM 8.1* 8.0* 7.5* 8.1*    GFR Estimated Creatinine Clearance: 74.5 mL/min (by C-G formula based on SCr of 0.48 mg/dL). Liver Function Tests: Recent Labs  Lab 01/15/22 1116  01/16/22 0500 01/18/22 0621  AST 41 39 32  ALT 36 32 28  ALKPHOS 105 103 95  BILITOT 0.8 0.7 0.7  PROT 6.9 6.5 6.7  ALBUMIN 2.2* 2.0* 2.1*    Recent Labs  Lab 01/15/22 1116  LIPASE 23    No results for input(s): AMMONIA in the last 168 hours. Coagulation profile Recent Labs  Lab 01/16/22 1325  INR 3.7*    COVID-19 Labs  No results for input(s): DDIMER, FERRITIN, LDH, CRP in the last 72 hours.  Lab Results  Component Value Date   SARSCOV2NAA NEGATIVE 01/15/2020   [place] NEGATIVE 09/20/2019   [place] Not Detected 07/06/2019    CBC: Recent Labs  Lab 01/15/22 1116 01/16/22 0500 01/18/22 0621  WBC 9.1 9.9 9.5  NEUTROABS 6.5  --  6.9  HGB 11.8* 11.0* 11.1*  HCT 36.5 34.5* 33.7*  MCV 85.1 86.0 84.9  PLT 538* 422* 455*    Cardiac Enzymes: No results for input(s): CKTOTAL, CKMB, CKMBINDEX, TROPONINI in the last 168 hours. BNP (last 3 results) No results for input(s): PROBNP in the last 8760 hours. CBG: Recent Labs  Lab 01/17/22 0747 01/17/22 1209 01/17/22 1616 01/17/22 2316 01/18/22 0755  GLUCAP 116* 156* 145* 186* 172*    D-Dimer: No results for input(s): DDIMER in the last 72 hours. Hgb A1c: No results for input(s): HGBA1C in the last 72 hours. Lipid Profile: No results for input(s): CHOL, HDL, LDLCALC, TRIG, CHOLHDL, LDLDIRECT in the last 72 hours. Thyroid function studies: No results for input(s): TSH, T4TOTAL, T3FREE, THYROIDAB in the last 72 hours.  Invalid input(s): FREET3 Anemia work up: No results for input(s): VITAMINB12, FOLATE, FERRITIN, TIBC, IRON, RETICCTPCT in the last 72 hours. Sepsis Labs: Recent Labs  Lab 01/15/22 1116 01/16/22 0500 01/18/22 0621  WBC 9.1 9.9 9.5    Microbiology Recent Results (from the past 240 hour(s))  Culture, blood (Routine X 2) w Reflex to ID Panel     Status: None (Preliminary result)   Collection Time: 01/16/22  8:45 AM   Specimen: BLOOD  Result Value Ref Range  Status   Specimen  Description   Final    BLOOD LEFT ANTECUBITAL Performed at [facility] [address], [place], [place] [address]    Special Requests   Final    IN PEDIATRIC BOTTLE Blood Culture adequate volume Performed at [facility] [address], [place], [place] [address]    Culture   Final    NO GROWTH 1 DAY Performed at [facility], [place] [address], [place], [place] [address]    Report Status PENDING  Incomplete  Culture, blood (Routine X 2) w Reflex to ID Panel     Status: None (Preliminary result)   Collection Time: 01/16/22  8:49 AM   Specimen: BLOOD  Result Value Ref Range Status   Specimen Description   Final    BLOOD RIGHT ANTECUBITAL Performed at [facility] [address] [place], [place] [address]    Special Requests   Final    IN PEDIATRIC BOTTLE Blood Culture adequate volume Performed at [facility] [address] [place], [place] [address]    Culture   Final    NO GROWTH 1 DAY Performed at [facility], [place] [address], [place], [place] [address]    Report Status PENDING  Incomplete  Aerobic/Anaerobic Culture w Gram Stain (surgical/deep wound)     Status: None (Preliminary result)   Collection Time: 01/16/22 11:50 AM   Specimen: Abscess  Result Value Ref Range Status   Specimen Description   Final    ABSCESS ABDOMINAL Performed at [facility] [address] [place], [place] [address]    Special Requests   Final    NONE Performed at [facility], [place] [address] [place], [place] [address]    Gram Stain   Final    NO WBC SEEN ABUNDANT GRAM POSITIVE COCCI ABUNDANT GRAM POSITIVE RODS ABUNDANT GRAM NEGATIVE RODS Performed at [facility], [place] [address] [place], [place] [address]    Culture MODERATE GRAM NEGATIVE RODS  Final   Report Status PENDING  Incomplete     Medications:    amLODipine  10 mg Oral QHS    Chlorhexidine Gluconate Cloth  6 each Topical Daily   feeding supplement  237 mL Oral BID BM   fentaNYL  1 patch Transdermal Q72H   fluconazole  100 mg Oral Daily   heparin  5,000 Units Subcutaneous Q8H   insulin aspart  0-15 Units Subcutaneous TID WC   insulin aspart  0-5 Units Subcutaneous QHS   irbesartan  37.5 mg Oral Daily   lipase/protease/amylase  72,000 Units Oral TID AC   multivitamin with minerals  1 tablet Oral Daily   pantoprazole  40 mg Oral BID   polyethylene glycol  17 g Oral BID   sodium chloride flush  5 mL Intracatheter Q8H   Continuous Infusions:  sodium chloride 10 mL/hr at 01/17/22 1107   cefTRIAXone (ROCEPHIN)  IV 2 g (01/17/22 2142)   metronidazole 500 mg (01/17/22 2220)      LOS: 2 days   Charlynne Cousins  [facility]  01/18/2022, 9:20 AM

## 2022-01-18 NOTE — Progress Notes (Signed)
She was having some chest discomfort this morning.  This is around the left side.  She has some shortness of breath.  She feels like she has a lot of gas.  She is belching.  She got some Maalox this morning.  She had some oxygen put on her.  When I listen to her, she has good breath sounds bilaterally.  I do hear some bowel sounds of her chest.  I do not know if she may have a hiatal hernia.  She had a CT angiogram of her chest just 3 days ago which was unremarkable.  She is on prophylactic heparin for DVT.  She does not complain of any pain over on her right side where she has a drainage tube.  I think this is still draining liquid from this abscess.  So far, cultures have not come back.  Cytology is not come back.  Her labs show white cell count 9.5.  Hemoglobin 11.1.  Platelet count 4 55,000.  Her blood sugar was 163.  Her albumin is 2.1.  There is no fever.  She has had no bleeding.  She has had no vomiting.  Her vital signs show temperature of 97.9.  Pulse 99.  Blood pressure 140/78.  Her lungs sound clear bilaterally.  I do not hear any rubs.  She has good air movement bilaterally.  Cardiac exam regular rate and rhythm.  I hear no murmurs.  Abdomen is soft.  Bowel sounds are present but may be slightly decreased.  She has no obvious fluid wave.  Extremities shows no clubbing, cyanosis or edema.  I do not think it would be a bad idea to get a chest x-ray on her.  Again I do not think this is a pulmonary embolism.  She is on heparin.  Her oxygen saturation was 95%.  She is not tachycardic.  I know that she has a lot going on right now.  She is doing with the cancer.  She is dealing with this abscess.  We will have to see what the cultures show.  She is on antibiotics.  She is clearly not ready for discharge.  I know that she is getting incredible care from all the staff up on [address].  Lattie Haw, MD  Psalms 30:2

## 2022-01-18 NOTE — Progress Notes (Signed)
Referring Physician(s): Memon,J  Supervising Physician: Markus Daft  Patient Status:  [facility] - In-pt  Chief Complaint: Hepatic abscess, s/p drain placement by Dr. Vernard Gambles on 01/16/22.      Subjective: Pt doing ok; did have some left sided chest discomfort/burning earlier improved with mylanta, ? reflux; denies worsening abd pain, fever/chills,N/V   Allergies: Lisinopril  Medications: Prior to Admission medications   Medication Sig Start Date End Date Taking? Authorizing Provider  amLODipine (NORVASC) 10 MG tablet Take 1 tablet (10 mg total) by mouth at bedtime. 11/27/21  Yes Nche, Charlene Brooke, NP  APIXABAN Arne [place]) VTE STARTER PACK ('10MG'$  AND '5MG'$ ) Take as directed on package: start with two-'5mg'$  tablets twice daily for 7 days. On day 8, switch to one-'5mg'$  tablet twice daily. Patient taking differently: Take 5 mg by mouth 2 (two) times daily. 12/24/21  Yes Volanda Napoleon, MD  atorvastatin (LIPITOR) 20 MG tablet Take 1 tablet (20 mg total) by mouth at bedtime. 01/25/21  Yes Nche, Charlene Brooke, NP  CREON 36000-114000 units CPEP capsule TAKE 2 CAPSULES BY MOUTH THREE TIMES DAILY BEFORE MEAL(S) Patient taking differently: Take 72,000 Units by mouth 3 (three) times daily before meals. 11/19/21  Yes Volanda Napoleon, MD  feeding supplement (BOOST HIGH PROTEIN) LIQD Take 2 Containers by mouth in the morning.   Yes [provider]  fentaNYL (DURAGESIC) 25 MCG/HR Place 1 patch onto the skin every 3 (three) days. 01/02/22  Yes Volanda Napoleon, MD  fluticasone (FLONASE) 50 MCG/ACT nasal spray Place 2 sprays into both nostrils daily. Patient taking differently: Place 2 sprays into both nostrils daily as needed for allergies or rhinitis. 05/14/21  Yes Tower, Wynelle Fanny, MD  glipiZIDE (GLUCOTROL XL) 5 MG 24 hr tablet Take 1 tablet (5 mg total) by mouth daily with breakfast. 09/14/21  Yes Nche, Charlene Brooke, NP  HYDROmorphone (DILAUDID) 4 MG tablet Take 1 tablet (4 mg total) by mouth every 4  (four) hours as needed for severe pain. 01/02/22  Yes Volanda Napoleon, MD  lidocaine-prilocaine (EMLA) cream Apply to affected area once Patient taking differently: 1 application. as needed (for port access). 12/26/21  Yes Volanda Napoleon, MD  metFORMIN (GLUCOPHAGE XR) 750 MG 24 hr tablet Take 1 tablet (750 mg total) by mouth daily with breakfast. 09/14/21  Yes Nche, Charlene Brooke, NP  olmesartan (BENICAR) 5 MG tablet Take 1 tablet (5 mg total) by mouth daily. 01/25/21  Yes Nche, Charlene Brooke, NP  ondansetron (ZOFRAN) 8 MG tablet Take 1 tablet (8 mg total) by mouth 2 (two) times daily as needed (Nausea or vomiting). Patient taking differently: Take 8 mg by mouth 2 (two) times daily as needed for nausea or vomiting. 12/26/21  Yes Ennever, Rudell Cobb, MD  pantoprazole (PROTONIX) 40 MG tablet Take 1 tablet (40 mg total) by mouth daily. 12/24/21  Yes Ennever, Rudell Cobb, MD  prochlorperazine (COMPAZINE) 10 MG tablet Take 1 tablet (10 mg total) by mouth every 6 (six) hours as needed (Nausea or vomiting). Patient taking differently: Take 10 mg by mouth every 6 (six) hours as needed for nausea or vomiting. 12/26/21  Yes Ennever, Rudell Cobb, MD  TYLENOL 8 HOUR 650 MG CR tablet Take 650-1,300 mg by mouth every 8 (eight) hours as needed for pain.   Yes [provider]  glucose blood test strip Test blood glucose once daily. One Touch Verio test strips 09/28/19   Nche, Charlene Brooke, NP  metoprolol succinate (TOPROL-XL) 25 MG 24 hr  tablet TAKE 1 TABLET BY MOUTH ONCE DAILY Patient not taking: Reported on 01/15/2022 01/25/21   Nche, Charlene Brooke, NP  pantoprazole (PROTONIX) 40 MG tablet Take 1 tablet (40 mg total) by mouth 2 (two) times daily for 14 days. Patient not taking: Reported on 01/15/2022 12/04/21 12/18/21  Nche, Charlene Brooke, NP  potassium chloride (KLOR-CON M) 20 MEQ tablet Take 1 tablet (20 mEq total) by mouth 2 (two) times daily. Patient not taking: Reported on 01/15/2022 11/27/21   Nche, Charlene Brooke, NP      Vital Signs: BP 140/78 (BP Location: Left Arm)   Pulse 99   Temp 97.9 F (36.6 C) (Oral)   Resp 18   Ht 5' 3.5" (1.613 m)   Wt 189 lb (85.7 kg)   SpO2 95%   BMI 32.95 kg/m   Physical Exam awake/alert; hepatic drain intact, insertion site ok, not significantly tender, output 25 cc cream colored fluid  Imaging: CT Angio Chest PE W and/or Wo Contrast  Result Date: 01/15/2022 CLINICAL DATA:  Shortness of breath with sensation of "feeling funny" over the last couple days. History of pancreatic cancer. Clinical concern for pulmonary embolism. EXAM: CT ANGIOGRAPHY CHEST WITH CONTRAST TECHNIQUE: Multidetector CT imaging of the chest was performed using the standard protocol during bolus administration of intravenous contrast. Multiplanar CT image reconstructions and MIPs were obtained to evaluate the vascular anatomy. RADIATION DOSE REDUCTION: This exam was performed according to the departmental dose-optimization program which includes automated exposure control, adjustment of the mA and/or kV according to patient size and/or use of iterative reconstruction technique. CONTRAST:  34m OMNIPAQUE IOHEXOL 350 MG/ML SOLN COMPARISON:  Chest radiographs 01/15/2022. CTs of the chest, abdomen and pelvis 12/21/2020 and CT of the abdomen and pelvis 12/13/2021. FINDINGS: Cardiovascular: The pulmonary arteries are well opacified with contrast to the level of the subsegmental branches. There is no evidence of acute pulmonary embolism. Right IJ Port-A-Cath extends into the right atrium. There is atherosclerosis of the aorta, great vessels and coronary arteries. The heart is mildly enlarged. No significant pericardial fluid. Mediastinum/Nodes: No enlarged mediastinal, hilar or axillary lymph nodes. Mildly prominent mediastinal lymph nodes are likely reactive. The thyroid gland, trachea and esophagus demonstrate no significant findings. Lungs/Pleura: New small left greater than right pleural effusions. Mild  dependent atelectasis in both lungs. No confluent airspace opacity, suspicious pulmonary nodule or pneumothorax. Upper abdomen: Interval development of a large collection of air and gas within the porta hepatis/gastrohepatic ligament, measuring approximately 10.0 x 7.4 x 7.8 cm. This lies superior to metallic biliary stent. There is increased pneumobilia. No free intraperitoneal air or other extraluminal fluid collection identified in the upper abdomen. Musculoskeletal/Chest wall: Chronic glenohumeral arthropathy, right greater than left. Mild thoracic spondylosis. No acute osseous findings or chest wall masses. Review of the MIP images confirms the above findings. IMPRESSION: 1. No evidence of acute pulmonary embolism. 2. New small bilateral pleural effusions with mild atelectasis at both lung bases. 3. New large collection of gas and air within the porta hepatis/gastrohepatic ligament compared with abdominal CT of last month. This could reflect an abscess or contained bowel perforation. Given venous thrombosis on prior abdominal CT, this could reflect cavitation of an infarct. Recommend further evaluation with dedicated CT of the abdomen and pelvis to include enteric contrast. 4. These results were called by telephone at the time of interpretation on 01/15/2022 at 1:11 pm to provider ADAM CURATOLO , who verbally acknowledged these results. Electronically Signed   By: WCaryl ComesD.  On: 01/15/2022 13:12   CT ABDOMEN PELVIS W CONTRAST  Result Date: 01/15/2022 CLINICAL DATA:  Acute generalized abdominal pain. History of pancreatic cancer. EXAM: CT ABDOMEN AND PELVIS WITH CONTRAST TECHNIQUE: Multidetector CT imaging of the abdomen and pelvis was performed using the standard protocol following bolus administration of intravenous contrast. RADIATION DOSE REDUCTION: This exam was performed according to the departmental dose-optimization program which includes automated exposure control, adjustment of the mA  and/or kV according to patient size and/or use of iterative reconstruction technique. CONTRAST:  117m OMNIPAQUE IOHEXOL 300 MG/ML  SOLN COMPARISON:  December 13, 2021. FINDINGS: Lower chest: Minimal bilateral posterior basilar subsegmental atelectasis is noted. Hepatobiliary: Hepatic pneumobilia is noted most likely due to common bile duct stent. There is interval development of 9.8 x 9.2 cm air-fluid collection in the central portion of the liver concerning for hepatic abscess. No gallstones are noted, although pneumobilia has extended into the gallbladder lumen. Pancreas: There is again noted pancreatic head mass that surrounds the common bile duct, and appears to be resulting in occlusion of the superior mesenteric and splenic veins. Severe pancreatic ductal dilatation is noted secondary to this tumor. Spleen: Normal in size without focal abnormality. Adrenals/Urinary Tract: Adrenal glands are unremarkable. No hydronephrosis or renal obstruction is noted. No renal or ureteral calculi are noted. 1.4 cm partially enhancing exophytic mass is seen arising from lower pole of right kidney which was present on prior exam and concerning for possible neoplasm. Bladder is unremarkable. Stomach/Bowel: Stomach is within normal limits. Appendix appears normal. No evidence of bowel wall thickening, distention, or inflammatory changes. Vascular/Lymphatic: Aortic atherosclerosis. Mildly enlarged periaortic adenopathy is noted concerning for metastatic disease. Reproductive: Uterus and bilateral adnexa are unremarkable. Other: No hernia is noted.  Mild ascites is noted in the pelvis. Musculoskeletal: No acute or significant osseous findings. IMPRESSION: Interval development of 9.8 x 9.2 cm air-fluid collection in the central portion of the liver concerning for hepatic abscess. There is again noted pancreatic head mass surrounds the common bile duct, and appears to be resulting in occlusion of the superior mesenteric and splenic  veins as noted on prior exam. Severe pancreatic ductal dilatation is noted. Hepatic pneumobilia is noted secondary to common bile duct stent. 1.4 cm partially enhancing exophytic mass is seen arising from lower pole right kidney concerning for possible neoplasm or malignancy. Mildly enlarged periaortic adenopathy is noted concerning for metastatic disease. Mild ascites is noted in the pelvis. Aortic Atherosclerosis (ICD10-I70.0). Electronically Signed   By: JMarijo ConceptionM.D.   On: 01/15/2022 16:02   DG CHEST PORT 1 VIEW  Result Date: 01/18/2022 CLINICAL DATA:  Left chest discomfort and dyspnea EXAM: PORTABLE CHEST 1 VIEW COMPARISON:  01/15/2022 chest radiograph. FINDINGS: Right internal jugular Port-A-Cath terminates at the cavoatrial junction. Stable cardiomediastinal silhouette with normal heart size. No pneumothorax. No pleural effusion. No pulmonary edema. Faint hazy left lung base opacity, similar, favor atelectasis. IMPRESSION: Faint hazy left lung base opacity, favor atelectasis. Electronically Signed   By: JIlona SorrelM.D.   On: 01/18/2022 08:24   DG Chest Portable 1 View  Result Date: 01/15/2022 CLINICAL DATA:  Short of breath EXAM: PORTABLE CHEST 1 VIEW COMPARISON:  09/19/2014 FINDINGS: Heart size mildly enlarged. Negative for heart failure or edema. Mild left lower lobe atelectasis/infiltrate. Right lung clear. No effusion. Port-A-Cath tip in the lower SVC at the cavoatrial junction Degenerative change in both shoulders right greater than left IMPRESSION: Mild left lower lobe airspace disease likely atelectasis Port-A-Cath tip at  the cavoatrial junction. Electronically Signed   By: Franchot Gallo M.D.   On: 01/15/2022 11:15   ECHOCARDIOGRAM COMPLETE  Result Date: 01/16/2022    ECHOCARDIOGRAM REPORT   Patient Name:   YISEL MEGILL Date of Exam: 01/16/2022 Medical Rec #:  [id number]         Height:       63.5 in Accession #:    [id number]        Weight:       189.0 lb Date of Birth:   October 26, 1955         BSA:          1.899 m Patient Age:    39 years          BP:           128/70 mmHg Patient Gender: F                 HR:           100 bpm. Exam Location:  Inpatient Procedure: 2D Echo, Cardiac Doppler, Color Doppler and Intracardiac            Opacification Agent Indications:    I50.40* Unspecified combined systolic (congestive) and diastolic                 (congestive) heart failure  History:        Patient has no prior history of Echocardiogram examinations.                 Signs/Symptoms:Shortness of Breath, Dyspnea and                 Dizziness/Lightheadedness; Risk Factors:Hypertension, Diabetes                 and Dyslipidemia. Cancer. ETOH.  Sonographer:    Roseanna Rainbow RDCS Referring Phys: [id number] [facility]  Sonographer Comments: Technically difficult study due to poor echo windows. Patient could not tolerate pressure from probe. Recent drain placed in subcostal region. IMPRESSIONS  1. Left ventricular ejection fraction, by estimation, is 40 to 45%. The left ventricle has mildly decreased function. The left ventricle demonstrates global hypokinesis. Left ventricular diastolic parameters are consistent with Grade I diastolic dysfunction (impaired relaxation). Elevated left ventricular end-diastolic pressure.  2. Right ventricular systolic function is normal. The right ventricular size is normal.  3. The mitral valve is normal in structure. Trivial mitral valve regurgitation. No evidence of mitral stenosis.  4. The aortic valve is tricuspid. Aortic valve regurgitation is not visualized. Mild aortic valve stenosis. Aortic valve area, by VTI measures 1.62 cm. Aortic valve mean gradient measures 11.0 mmHg. Aortic valve Vmax measures 2.28 m/s.  5. The inferior vena cava is normal in size with greater than 50% respiratory variability, suggesting right atrial pressure of 3 mmHg. FINDINGS  Left Ventricle: Left ventricular ejection fraction, by estimation, is 40 to 45%. The left ventricle has  mildly decreased function. The left ventricle demonstrates global hypokinesis. The left ventricular internal cavity size was normal in size. There is  no left ventricular hypertrophy. Left ventricular diastolic parameters are consistent with Grade I diastolic dysfunction (impaired relaxation). Elevated left ventricular end-diastolic pressure. Right Ventricle: The right ventricular size is normal. No increase in right ventricular wall thickness. Right ventricular systolic function is normal. Left Atrium: Left atrial size was normal in size. Right Atrium: Right atrial size was normal in size. Pericardium: There is no evidence of pericardial effusion. Mitral Valve: The mitral valve is normal in  structure. Trivial mitral valve regurgitation. No evidence of mitral valve stenosis. MV peak gradient, 11.8 mmHg. The mean mitral valve gradient is 5.0 mmHg. Tricuspid Valve: The tricuspid valve is normal in structure. Tricuspid valve regurgitation is trivial. No evidence of tricuspid stenosis. Aortic Valve: The aortic valve is tricuspid. Aortic valve regurgitation is not visualized. Mild aortic stenosis is present. Aortic valve mean gradient measures 11.0 mmHg. Aortic valve peak gradient measures 20.7 mmHg. Aortic valve area, by VTI measures 1.62 cm. Pulmonic Valve: The pulmonic valve was normal in structure. Pulmonic valve regurgitation is not visualized. No evidence of pulmonic stenosis. Aorta: The aortic root is normal in size and structure. Venous: The inferior vena cava was not well visualized. The inferior vena cava is normal in size with greater than 50% respiratory variability, suggesting right atrial pressure of 3 mmHg. IAS/Shunts: No atrial level shunt detected by color flow Doppler.  LEFT VENTRICLE PLAX 2D LVIDd:         4.50 cm     Diastology LVIDs:         3.20 cm     LV e' medial:    4.57 cm/s LV PW:         1.00 cm     LV E/e' medial:  18.5 LV IVS:        1.00 cm     LV e' lateral:   5.00 cm/s LVOT diam:      1.70 cm     LV E/e' lateral: 16.9 LV SV:         54 LV SV Index:   28 LVOT Area:     2.27 cm  LV Volumes (MOD) LV vol d, MOD A2C: 90.9 ml LV vol d, MOD A4C: 80.6 ml LV vol s, MOD A2C: 44.1 ml LV vol s, MOD A4C: 51.8 ml LV SV MOD A2C:     46.8 ml LV SV MOD A4C:     80.6 ml LV SV MOD BP:      37.5 ml RIGHT VENTRICLE RV S prime:     16.20 cm/s TAPSE (M-mode): 2.0 cm LEFT ATRIUM             Index        RIGHT ATRIUM          Index LA diam:        3.40 cm 1.79 cm/m   RA Area:     9.83 cm LA Vol (A2C):   29.0 ml 15.27 ml/m  RA Volume:   18.20 ml 9.58 ml/m LA Vol (A4C):   33.1 ml 17.43 ml/m LA Biplane Vol: 31.7 ml 16.69 ml/m  AORTIC VALVE AV Area (Vmax):    1.63 cm AV Area (Vmean):   1.59 cm AV Area (VTI):     1.62 cm AV Vmax:           227.67 cm/s AV Vmean:          151.667 cm/s AV VTI:            0.330 m AV Peak Grad:      20.7 mmHg AV Mean Grad:      11.0 mmHg LVOT Vmax:         163.00 cm/s LVOT Vmean:        106.000 cm/s LVOT VTI:          0.236 m LVOT/AV VTI ratio: 0.72  AORTA Ao Root diam: 2.60 cm Ao Asc diam:  3.00 cm MITRAL VALVE  TRICUSPID VALVE MV Area (PHT): 5.52 cm     TR Peak grad:   10.0 mmHg MV Area VTI:   1.65 cm     TR Vmax:        158.00 cm/s MV Peak grad:  11.8 mmHg MV Mean grad:  5.0 mmHg     SHUNTS MV Vmax:       1.72 m/s     Systemic VTI:  0.24 m MV Vmean:      96.8 cm/s    Systemic Diam: 1.70 cm MV Decel Time: 137 msec MV E velocity: 84.33 cm/s MV A velocity: 156.33 cm/s MV E/A ratio:  0.54 Skeet Latch MD Electronically signed by Skeet Latch MD Signature Date/Time: 01/16/2022/4:14:37 PM    Final    CT IMAGE GUIDED DRAINAGE BY PERCUTANEOUS CATHETER  Result Date: 01/17/2022 CLINICAL DATA:  Pancreatic carcinoma status post biliary stenting. 9.8 cm gas and fluid collection in the porta hepatis EXAM: CT GUIDED DRAINAGE OF ABDOMINAL ABSCESS ANESTHESIA/SEDATION: Intravenous Fentanyl 174mg and Versed '2mg'$  were administered as conscious sedation during continuous  monitoring of the patient's level of consciousness and physiological / cardiorespiratory status by the radiology RN, with a total moderate sedation time of 20 minutes. PROCEDURE: The procedure, risks, benefits, and alternatives were explained to the patient. Questions regarding the procedure were encouraged and answered. the patient understands and consents to the procedure. select axial scans through the abdomen were obtained. the collection was localized an appropriate skin entry site was determined and marked. The operative field was prepped with chlorhexidinein a sterile fashion, and a sterile drape was applied covering the operative field. A sterile gown and sterile gloves were used for the procedure. Local anesthesia was provided with 1% Lidocaine. Under CT fluoroscopic guidance, 18 gauge needle was advanced into the collection. Amplatz guidewire advanced easily. CT confirmed appropriate positioning. Tract dilated with 10 French dilator to facilitate placement of a 10 French pigtail drain catheter, formed centrally within the dependent aspect of the collection. 10 mL of thin purulent material were aspirated, sent for Gram stain and culture. Catheter secured externally 0 Prolene suture and StatLock and placed to gravity drain bag. The patient tolerated the procedure well. COMPLICATIONS: None immediate FINDINGS: Large gas and fluid collection in the porta hepatis was localized. 10 French drain catheter placed as above. 10 mL of purulent aspirate sent for Gram stain and culture. IMPRESSION: 1. Technically successful subhepatic abscess drain catheter placement with CT guidance. RADIATION DOSE REDUCTION: This exam was performed according to the departmental dose-optimization program which includes automated exposure control, adjustment of the mA and/or kV according to patient size and/or use of iterative reconstruction technique. Electronically Signed   By: DLucrezia EuropeM.D.   On: 01/17/2022 07:37     Labs:  CBC: Recent Labs    01/09/22 0821 01/15/22 1116 01/16/22 0500 01/18/22 0621  WBC 7.6 9.1 9.9 9.5  HGB 7.4* 11.8* 11.0* 11.1*  HCT 22.7* 36.5 34.5* 33.7*  PLT 298 538* 422* 455*    COAGS: Recent Labs    01/16/22 1325  INR 3.7*    BMP: Recent Labs    01/15/22 1116 01/16/22 0500 01/17/22 1234 01/18/22 0621  NA 128* 131* 132* 134*  K 4.1 3.9 3.1* 3.6  CL 92* 97* 100 99  CO2 '26 26 24 26  '$ GLUCOSE 283* 100* 157* 163*  BUN 13 9 7* 5*  CALCIUM 8.1* 8.0* 7.5* 8.1*  CREATININE 0.67 0.47 0.61 0.48  GFRNONAA >60 >60 >60 >60  LIVER FUNCTION TESTS: Recent Labs    01/09/22 0821 01/15/22 1116 01/16/22 0500 01/18/22 0621  BILITOT 0.8 0.8 0.7 0.7  AST 46* 41 39 32  ALT 43 36 32 28  ALKPHOS 128* 105 103 95  PROT 6.7 6.9 6.5 6.7  ALBUMIN 2.8* 2.2* 2.0* 2.1*    Assessment and Plan: Patient with history of pancreatic carcinoma with prior biliary stenting, HTN,DM, PVT ;now with hepatic abscess, status post drain placement on 01/16/2022 (10 fr to bag); output today 25 cc so far; afebrile, WBC normal, hemoglobin stable, drain fluid cultures pending, blood cultures negative to date, total bilirubin normal, creatinine normal, cytology pending; continue drain irrigation and close output monitoring; once output minimal over a 2 to 3-day span obtain follow-up CT   Electronically Signed: D. Rowe Robert, PA-C 01/18/2022, 10:36 AM   I spent a total of 15 minutes at the the patient's bedside AND on the patient's hospital floor or unit, greater than 50% of which was counseling/coordinating care for hepatic abscess    Patient ID: Melanie Alvarez, Melanie Alvarez   DOB: 1955-10-07, 66 y.o.   MRN: [id number]

## 2022-01-19 ENCOUNTER — Inpatient Hospital Stay (HOSPITAL_COMMUNITY): Payer: BC Managed Care – PPO

## 2022-01-19 DIAGNOSIS — K769 Liver disease, unspecified: Secondary | ICD-10-CM | POA: Diagnosis not present

## 2022-01-19 LAB — GLUCOSE, CAPILLARY
Glucose-Capillary: 130 mg/dL — ABNORMAL HIGH (ref 70–99)
Glucose-Capillary: 155 mg/dL — ABNORMAL HIGH (ref 70–99)
Glucose-Capillary: 155 mg/dL — ABNORMAL HIGH (ref 70–99)
Glucose-Capillary: 114 mg/dL — ABNORMAL HIGH (ref 70–99)

## 2022-01-19 IMAGING — CT CT ABD-PELV W/O CM
2 of 4 series · 15 of 46 positions shown, 17 images · non-contrast
Comparison: CT abdomen pelvis [DATE]

CLINICAL DATA: Intra-abdominal abscess follow up abscess.
pancreatic cancer on chemotherapy with the last treatment on
[DATE], portal vein and mesenteric thrombosis on Eliquis,
pancreatic insufficiency, chronic pain syndrome



[Series 2: axial st · axial · 0.83mm/px · z∈[+1111,+1506]mm · 12 of 89 slices shown, 14 images]
[im 5/89  soft-tissue]
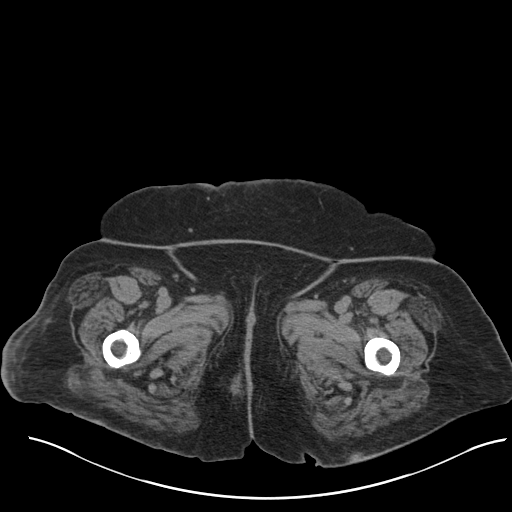
[im 5/89  bone]
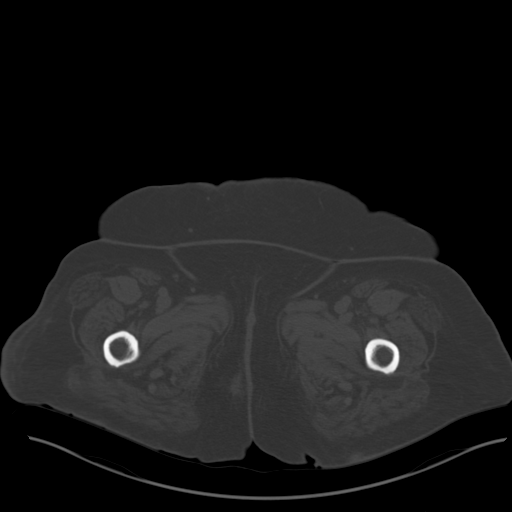
[im 15/89  soft-tissue]
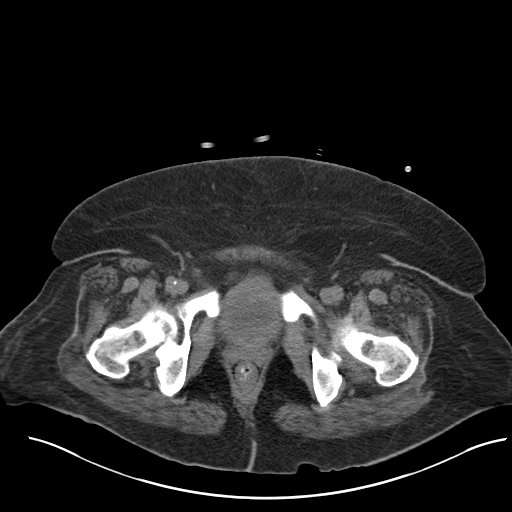
[im 20/89  soft-tissue]
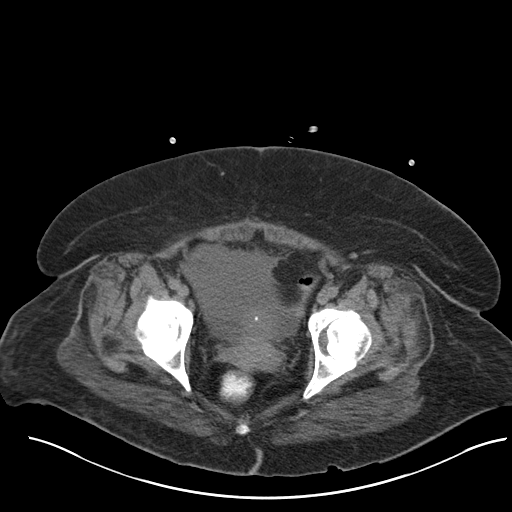
[im 25/89  soft-tissue]
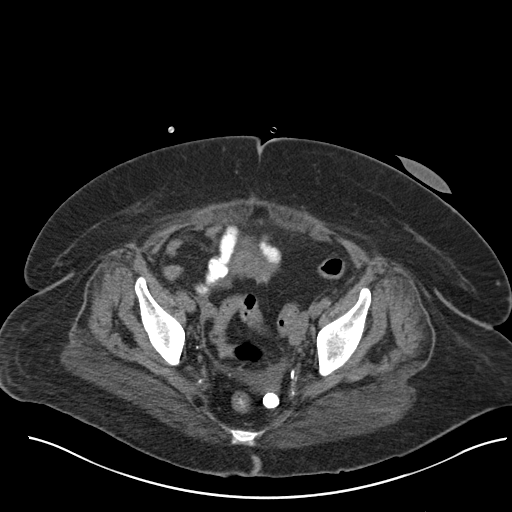
[im 35/89  soft-tissue]
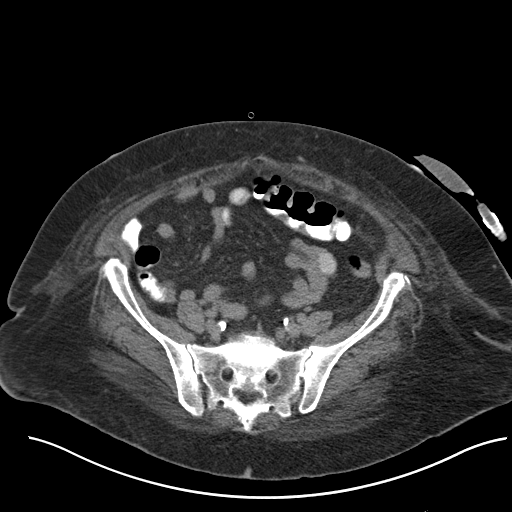
[im 40/89  soft-tissue]
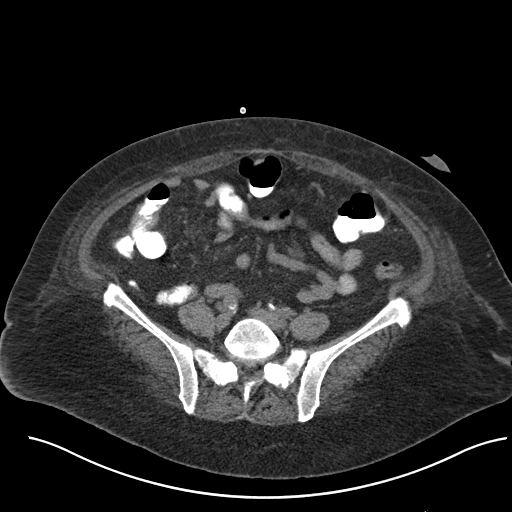
[im 49/89  soft-tissue]
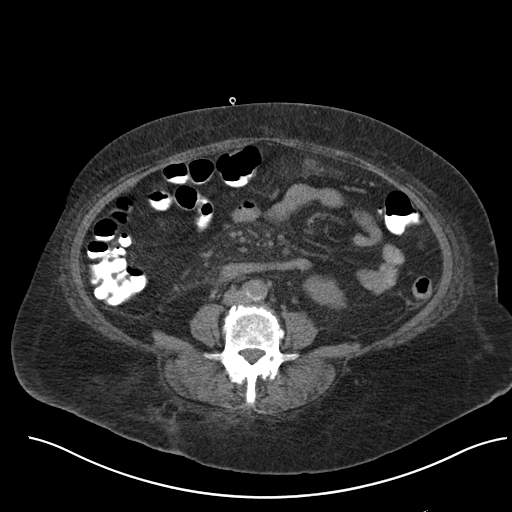
[im 54/89  soft-tissue]
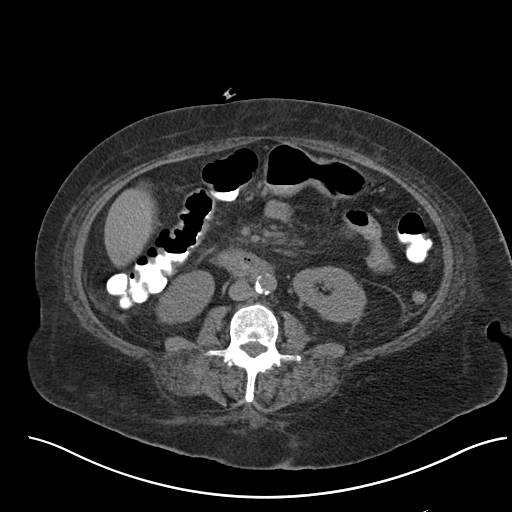
[im 64/89  soft-tissue]
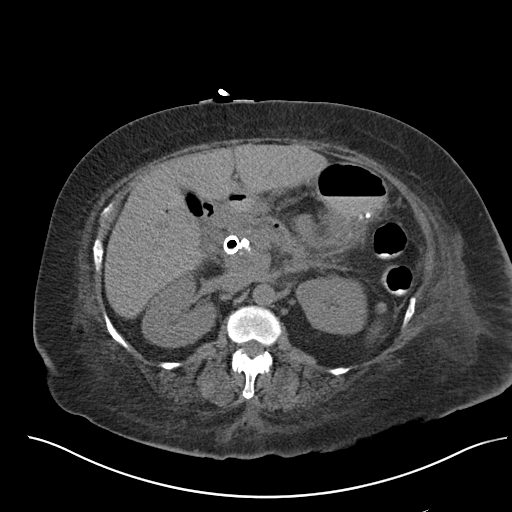
[im 64/89  bone]
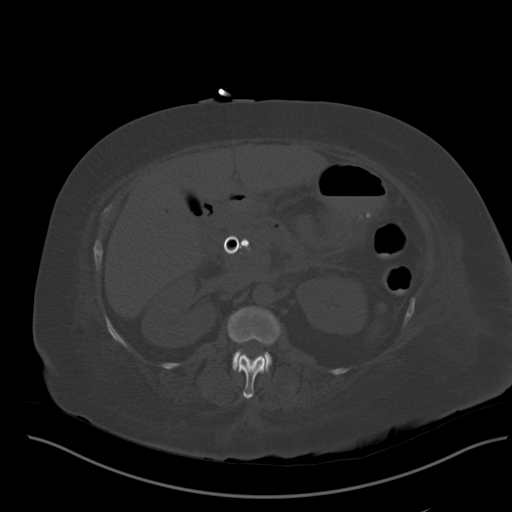
[im 69/89  soft-tissue]
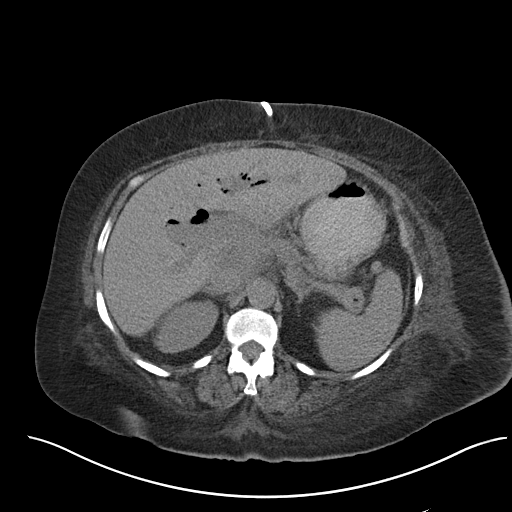
[im 74/89  soft-tissue]
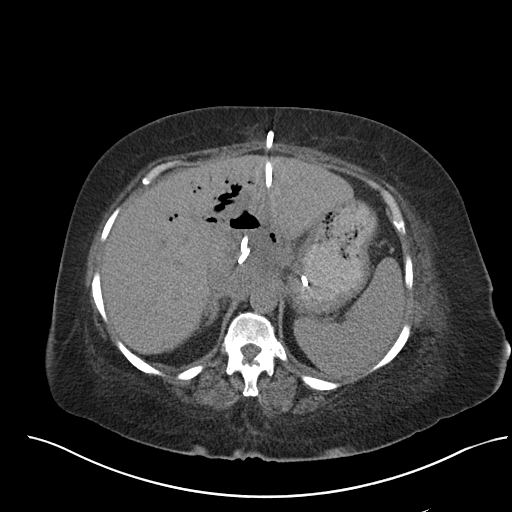
[im 84/89  soft-tissue]
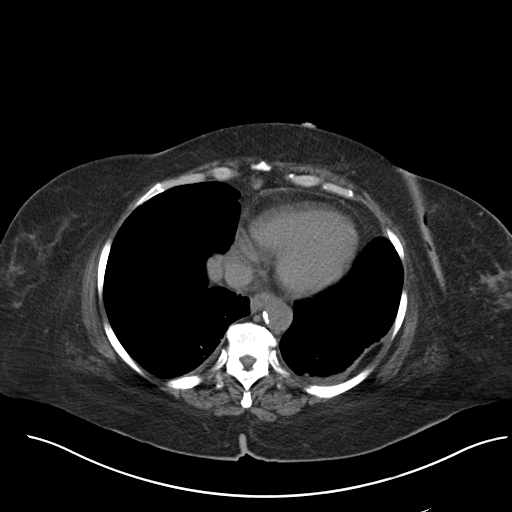

[Series 4: coronal st · coronal · 0.86mm/px · 3 of 163 slices shown]
[im 55/163  soft-tissue]
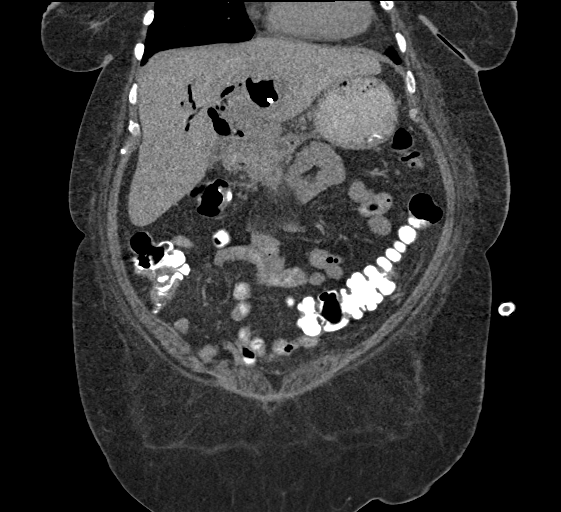
[im 73/163  soft-tissue]
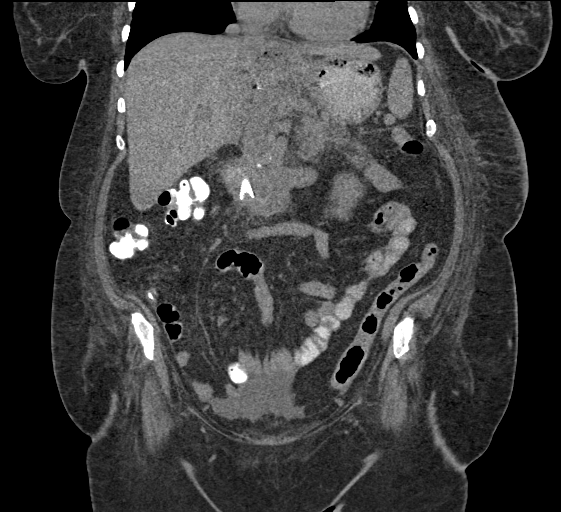
[im 91/163  soft-tissue]
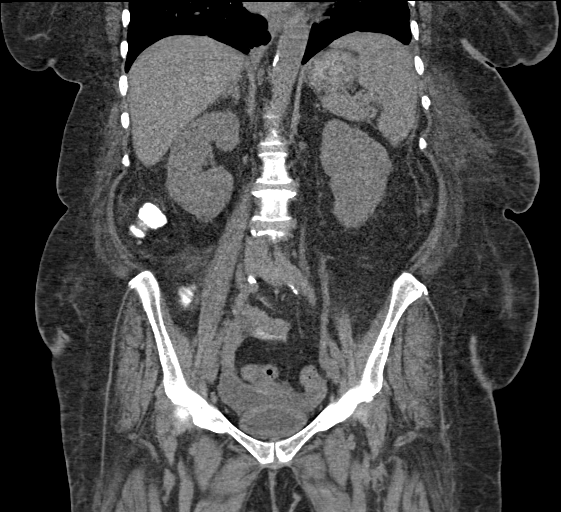

[15 of 46 positions shown; findings below may reference images not displayed]

FINDINGS: Lower chest: Bilateral lower lobe subsegmental atelectasis.

Hepatobiliary: No focal liver abnormality. No gallstones,
gallbladder wall thickening, or pericholecystic fluid. No biliary
dilatation.Redemonstration of a common bile duct stent in stable
position. Persistent pneumobilia

Pancreas: Grossly unremarkable known pancreatic mass is again noted
along the proximal pancreas and difficult to measure on this
noncontrast study. Normal pancreatic contour. No surrounding
inflammatory changes. No main pancreatic ductal dilatation.

Spleen: Normal in size without focal abnormality.

Adrenals/Urinary Tract:

No adrenal nodule bilaterally.

No nephrolithiasis and no hydronephrosis. Redemonstration of a
cm exophytic right inferior renal pole lesion with a density of -17
Hounsfield units.

No ureterolithiasis or hydroureter.

The urinary bladder is unremarkable.

Stomach/Bowel: Stomach is within normal limits. No evidence of bowel
wall thickening or dilatation. Appendix appears normal.

Vascular/Lymphatic: No abdominal aorta or iliac aneurysm. Moderate
atherosclerotic plaque of the aorta and its branches. No abdominal,
pelvic, or inguinal lymphadenopathy.

Reproductive: Uterus and bilateral adnexa are unremarkable.

Other: Persistent trace to small volume free fluid within the lower
abdomen and pelvis. No intraperitoneal free gas. Interval decrease
in size of a difficult to measure on this noncontrast study 7 x
x 7cm gas and fluid collection (from 9.2 x 9.8 cm). The collection
is again noted to abut pancreas and liver. It extends to adjacent to
the lesser curvature of the stomach. Interval placement of pigtail
catheter with tip likely terminating within the collection.

Musculoskeletal:

No abdominal wall hernia or abnormality.

No suspicious lytic or blastic osseous lesions. No acute displaced
fracture.
IMPRESSION: 1. Interval decrease in size of a upper abdominal difficult to
measure on this noncontrast study 7 x 5.5 cm gas and fluid
collection (from 9.2 x 9.8 cm). Interval placement of a pigtail
catheter which likely terminates within the collection.
2. Grossly unremarkable known pancreatic mass is again noted along
the proximal pancreas and difficult to measure on this noncontrast
study.
3. Common bile duct stent in appropriate position.
4. Persistent trace to small volume ascites within the pelvis.
5. Indeterminate 1.4 cm exophytic right inferior renal pole lesion.
Consider nonemergent MRI renal protocol for further evaluation.
6.  Aortic Atherosclerosis ([8L]-[8L]).

## 2022-01-19 MED ORDER — IOHEXOL 9 MG/ML PO SOLN
ORAL | Status: AC
  Administered 2022-01-19: 500 mL
  Filled 2022-01-19: qty 1000

## 2022-01-19 MED ORDER — POTASSIUM CHLORIDE CRYS ER 20 MEQ PO TBCR
20.0000 meq | EXTENDED_RELEASE_TABLET | Freq: Two times a day (BID) | ORAL | Status: DC
  Administered 2022-01-19 – 2022-01-22 (×7): 20 meq via ORAL
  Filled 2022-01-19 (×7): qty 1

## 2022-01-19 MED ORDER — SODIUM CHLORIDE 0.9% FLUSH
10.0000 mL | Freq: Two times a day (BID) | INTRAVENOUS | Status: DC
  Administered 2022-01-20 – 2022-01-22 (×3): 10 mL

## 2022-01-19 MED ORDER — SODIUM CHLORIDE 0.9% FLUSH: 10.0000 mL | INTRAVENOUS | Status: DC | PRN

## 2022-01-19 MED ORDER — PROCHLORPERAZINE MALEATE 10 MG PO TABS: 10.0000 mg | ORAL_TABLET | Freq: Four times a day (QID) | ORAL | Status: DC | PRN

## 2022-01-19 MED ORDER — METOPROLOL SUCCINATE ER 25 MG PO TB24
25.0000 mg | ORAL_TABLET | Freq: Every day | ORAL | Status: DC
  Administered 2022-01-19 – 2022-01-22 (×4): 25 mg via ORAL
  Filled 2022-01-19 (×4): qty 1

## 2022-01-19 NOTE — Progress Notes (Signed)
Melanie Alvarez is feeling little bit better.  She is not complaining of any chest pain or shortness of breath.  She had a chest x-ray yesterday which showed some possible atelectasis.  The abscess is draining E. coli.  She is on antibiotics for this.  E. coli is pansensitive.  I think she goes for a CT scan today.  I do appreciate the incredible help from infectious disease.  It sounds like she is probably going to need a lengthy course of antibiotics.  This may make treating the pancreatic cancer somewhat challenging.  Sounds like she will need antibiotics until the middle of July.  I think we should still be able to treat the underlying cancer.  Again, we will have to use colony-stimulating factors.  She did not have any lab work done today.  She is afebrile.  She is trying to eat a little bit more.  Her vital signs are all pretty stable.  She is afebrile.  Her abdomen is soft.  She has a drainage tube in the right side.  She has slightly decreased bowel sounds.  There is no guarding or rebound tenderness.  Her lungs are clear.  Cardiac exam regular rate and rhythm.  Again, she came in because of the hepatic abscess.  It is grown E. coli.  I really hope that cytology was sent off on the fluid.  Again I am not sure as to why she would have an abscess in the liver.  She, at least on her last CT scan did not have any obvious metastatic disease to the liver.  Again, we will have to "walk a fine line" with respect to the abscess treatment in her pancreatic cancer treatment.  I know that she is getting incredible care from everybody up on 6 E.  I do appreciate the compassion of the staff upon 6 E.  Lattie Haw, MD  Darlyn Chamber 17:14

## 2022-01-19 NOTE — Progress Notes (Signed)
[facility] PROGRESS NOTE    Progress Note  COOKIE PORE  JJO:[id number] DOB: 08/28/1955 DOA: 01/15/2022 PCP: Flossie Buffy, NP     Brief Narrative:   Melanie Alvarez is an 66 y.o. 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  CT scan of the abdomen pelvis showed possible abscess IR was consulted to place a drain and 120 mL of purulent material are draining.   Assessment/Plan:   Hepatic abscess: CT of abdomen and pelvis showed a 9 x 9 concerning for active abscess, IR was consulted on 120 mL of purulent material came out.  About 30 cc of purulent material. Infectious diseases been consulted. They recommended to change antibiotics to cefazolin and Flagyl p.o. CT scan of the abdomen on 01/19/2022, if abscess is less than 5 cm can be discharged on amoxicillin with a stop date on 03/01/2022  Pancreatic cancer: Follow-up with Dr. Marin Olp for future treatment.  Portal vein thrombosis: No signs of bleeding resume Eliquis.  Constipation: Constipation is resolved has had multiple bowel movements.  Dyspnea on exertion: Of unclear etiology may be related to ascites. CT PE small bilateral effusions unlikely to be contributing. Now resolved.  Hypovolemic hyponatremia: Improving with fluid restriction basic metabolic panels pending this morning.  Orthostatic hypotension: Now resolved.  Diabetes mellitus type 2 not on insulin: Hold metformin and glipizide, continue sliding scale.  Hyperlipidemia: Continue statins.  Chronic pancreatic insufficiency: Continue Creon.  Essential hypertension: Pressures well controlled, continue Norvasc and  Avapro.  Morbid Obesity: Counseled.   DVT prophylaxis: none Family Communication:none Status is: Observation The patient will require care spanning > 2 midnights and should be moved to inpatient because: Hepatic abscess requiring IV antibiotics which will need aspiration and drainage    Code Status:     Code Status Orders  (From admission, onward)           Start     Ordered   01/15/22 1903  Do not attempt resuscitation (DNR)  Continuous       Question Answer Comment  In the event of cardiac or respiratory ARREST Do not call a "code blue"   In the event of cardiac or respiratory ARREST Do not perform Intubation, CPR, defibrillation or ACLS   In the event of cardiac or respiratory ARREST Use medication by any route, position, wound care, and other measures to relive pain and suffering. May use oxygen, suction and manual treatment of airway obstruction as needed for comfort.      01/15/22 1904           Code Status History     Date Active Date Inactive Code Status Order ID Comments User Context   09/20/2019 2011 09/26/2019 1745 Full Code [id number]  Jani Gravel, MD ED         IV Access:   Peripheral IV   Procedures and diagnostic studies:   DG CHEST PORT 1 VIEW  Result Date: 01/18/2022 CLINICAL DATA:  Left chest discomfort and dyspnea EXAM: PORTABLE CHEST 1 VIEW COMPARISON:  01/15/2022 chest radiograph. FINDINGS: Right internal jugular Port-A-Cath terminates at the cavoatrial junction. Stable cardiomediastinal silhouette with normal heart size. No pneumothorax. No pleural effusion. No pulmonary edema. Faint hazy left lung base opacity, similar, favor  atelectasis. IMPRESSION: Faint hazy left lung base opacity, favor atelectasis. Electronically Signed   By: Ilona Sorrel M.D.   On: 01/18/2022 08:24     Medical Consultants:   None.   Subjective:    Christiana Fuchs relates her dizziness has resolved.  Objective:    Vitals:   01/18/22 0633 01/18/22 1549  01/18/22 2223 01/19/22 0615  BP: 140/78 (!) 141/76 (!) 145/76 121/80  Pulse: 99 (!) 102 (!) 102 96  Resp: '18 18 20 18  '$ Temp: 97.9 F (36.6 C) 97.6 F (36.4 C) 98.2 F (36.8 C) 98.2 F (36.8 C)  TempSrc: Oral Oral Oral Oral  SpO2: 95% 99% 100% 92%  Weight:      Height:       SpO2: 92 % O2 Flow Rate (L/min): 2 L/min   Intake/Output Summary (Last 24 hours) at 01/19/2022 0818 Last data filed at 01/19/2022 0600 Gross per 24 hour  Intake 670.5 ml  Output 930 ml  Net -259.5 ml    Filed Weights   01/15/22 1116  Weight: 85.7 kg    Exam: General exam: In no acute distress. Respiratory system: Good air movement and clear to auscultation. Cardiovascular system: S1 & S2 heard, RRR. No JVD. Gastrointestinal system: Abdomen is nondistended, soft and nontender.  Extremities: No pedal edema. Skin: No rashes, lesions or ulcers Psychiatry: Judgement and insight appear normal. Mood & affect appropriate.  Data Reviewed:    Labs: Basic Metabolic Panel: Recent Labs  Lab 01/15/22 1116 01/16/22 0500 01/17/22 1234 01/18/22 0621  NA 128* 131* 132* 134*  K 4.1 3.9 3.1* 3.6  CL 92* 97* 100 99  CO2 '26 26 24 26  '$ GLUCOSE 283* 100* 157* 163*  BUN 13 9 7* 5*  CREATININE 0.67 0.47 0.61 0.48  CALCIUM 8.1* 8.0* 7.5* 8.1*    GFR Estimated Creatinine Clearance: 74.5 mL/min (by C-G formula based on SCr of 0.48 mg/dL). Liver Function Tests: Recent Labs  Lab 01/15/22 1116 01/16/22 0500 01/18/22 0621  AST 41 39 32  ALT 36 32 28  ALKPHOS 105 103 95  BILITOT 0.8 0.7 0.7  PROT 6.9 6.5 6.7  ALBUMIN 2.2* 2.0* 2.1*    Recent Labs  Lab 01/15/22 1116  LIPASE 23    No results for input(s): AMMONIA in the last 168 hours. Coagulation profile Recent Labs  Lab 01/16/22 1325  INR 3.7*    COVID-19 Labs  No results for input(s): DDIMER, FERRITIN, LDH, CRP in the last 72 hours.  Lab Results  Component Value Date   SARSCOV2NAA NEGATIVE 01/15/2020   [place] NEGATIVE 09/20/2019    [place] Not Detected 07/06/2019    CBC: Recent Labs  Lab 01/15/22 1116 01/16/22 0500 01/18/22 0621  WBC 9.1 9.9 9.5  NEUTROABS 6.5  --  6.9  HGB 11.8* 11.0* 11.1*  HCT 36.5 34.5* 33.7*  MCV 85.1 86.0 84.9  PLT 538* 422* 455*    Cardiac Enzymes: No results for input(s): CKTOTAL, CKMB, CKMBINDEX, TROPONINI in the last 168 hours. BNP (last 3 results) No results for input(s): PROBNP in the last 8760 hours. CBG: Recent Labs  Lab 01/18/22 0755 01/18/22 1152 01/18/22 1622 01/18/22 2205 01/19/22 0726  GLUCAP 172* 199* 181* 94 130*    D-Dimer: No results for input(s): DDIMER in the last 72 hours. Hgb A1c: No results for input(s): HGBA1C in the last 72 hours. Lipid Profile: No results for input(s): CHOL, HDL, LDLCALC, TRIG, CHOLHDL, LDLDIRECT in the last 72 hours. Thyroid function studies: No results for  input(s): TSH, T4TOTAL, T3FREE, THYROIDAB in the last 72 hours.  Invalid input(s): FREET3 Anemia work up: No results for input(s): VITAMINB12, FOLATE, FERRITIN, TIBC, IRON, RETICCTPCT in the last 72 hours. Sepsis Labs: Recent Labs  Lab 01/15/22 1116 01/16/22 0500 01/18/22 0621  WBC 9.1 9.9 9.5    Microbiology Recent Results (from the past 240 hour(s))  Culture, blood (Routine X 2) w Reflex to ID Panel     Status: None (Preliminary result)   Collection Time: 01/16/22  8:45 AM   Specimen: BLOOD  Result Value Ref Range Status   Specimen Description   Final    BLOOD LEFT ANTECUBITAL Performed at [facility] [address], [place], [place] [address]    Special Requests   Final    IN PEDIATRIC BOTTLE Blood Culture adequate volume Performed at [facility] [address], [place], [place] [address]    Culture   Final    NO GROWTH 3 DAYS Performed at [facility], [place] [address] [place], [place]  [address]    Report Status PENDING  Incomplete  Culture, blood (Routine X 2) w Reflex to ID Panel     Status: None  (Preliminary result)   Collection Time: 01/16/22  8:49 AM   Specimen: BLOOD  Result Value Ref Range Status   Specimen Description   Final    BLOOD RIGHT ANTECUBITAL Performed at [facility] [address] [place], [place] [address]    Special Requests   Final    IN PEDIATRIC BOTTLE Blood Culture adequate volume Performed at [facility] [address], [place], [place] [address]    Culture   Final    NO GROWTH 3 DAYS Performed at [facility], [place] [address] [place], [place] [address]    Report Status PENDING  Incomplete  Aerobic/Anaerobic Culture w Gram Stain (surgical/deep wound)     Status: None (Preliminary result)   Collection Time: 01/16/22 11:50 AM   Specimen: Abscess  Result Value Ref Range Status   Specimen Description   Final    ABSCESS ABDOMINAL Performed at [facility] [address], [place], [place] [address]    Special Requests   Final    NONE Performed at [facility], [place] [address], [place], [place] [address]    Gram Stain   Final    NO WBC SEEN ABUNDANT GRAM POSITIVE COCCI ABUNDANT GRAM POSITIVE RODS ABUNDANT GRAM NEGATIVE RODS    Culture   Final    MODERATE ESCHERICHIA COLI HOLDING FOR POSSIBLE ANAEROBE Performed at [facility], [place] [address], [place], [place] [address]    Report Status PENDING  Incomplete   Organism ID, Bacteria ESCHERICHIA COLI  Final      Susceptibility   Escherichia coli - MIC*    AMPICILLIN <=2 SENSITIVE Sensitive     CEFAZOLIN <=4 SENSITIVE Sensitive     CEFEPIME <=0.12 SENSITIVE Sensitive     CEFTAZIDIME <=1 SENSITIVE Sensitive     CEFTRIAXONE <=0.25 SENSITIVE Sensitive     CIPROFLOXACIN <=0.25 SENSITIVE Sensitive     GENTAMICIN <=1 SENSITIVE Sensitive     IMIPENEM <=0.25 SENSITIVE Sensitive     TRIMETH/SULFA <=20 SENSITIVE Sensitive     AMPICILLIN/SULBACTAM <=2 SENSITIVE Sensitive     PIP/TAZO <=4 SENSITIVE Sensitive     *  MODERATE ESCHERICHIA COLI     Medications:    amLODipine  10 mg Oral QHS   apixaban  5 mg Oral BID   atorvastatin  20 mg Oral QHS   Chlorhexidine Gluconate Cloth  6 each Topical Daily   feeding supplement  237 mL Oral BID BM   fentaNYL  1 patch Transdermal Q72H   fluconazole  100 mg Oral Daily   insulin aspart  0-15 Units Subcutaneous TID WC   insulin aspart  0-5 Units Subcutaneous QHS   insulin detemir  5 Units Subcutaneous BID   irbesartan  37.5 mg Oral Daily   lipase/protease/amylase  72,000 Units Oral TID AC   multivitamin with minerals  1 tablet Oral Daily   pantoprazole  40 mg Oral BID   sodium chloride flush  10-40 mL Intracatheter Q12H   sodium chloride flush  5 mL Intracatheter Q8H   Continuous Infusions:  sodium chloride 10 mL/hr at 01/17/22 1107   ampicillin-sulbactam (UNASYN) IV 3 g (01/19/22 0450)   lactated ringers 75 mL/hr at 01/19/22 0056      LOS: 3 days   Charlynne Cousins  [facility]  01/19/2022, 8:18 AM

## 2022-01-20 DIAGNOSIS — K769 Liver disease, unspecified: Secondary | ICD-10-CM | POA: Diagnosis not present

## 2022-01-20 LAB — GLUCOSE, CAPILLARY
Glucose-Capillary: 109 mg/dL — ABNORMAL HIGH (ref 70–99)
Glucose-Capillary: 155 mg/dL — ABNORMAL HIGH (ref 70–99)
Glucose-Capillary: 191 mg/dL — ABNORMAL HIGH (ref 70–99)
Glucose-Capillary: 92 mg/dL (ref 70–99)

## 2022-01-20 LAB — AEROBIC/ANAEROBIC CULTURE W GRAM STAIN (SURGICAL/DEEP WOUND): Gram Stain: NONE SEEN

## 2022-01-20 MED ORDER — SIMETHICONE 40 MG/0.6ML PO SUSP: 40.0000 mg | Freq: Four times a day (QID) | ORAL | Status: DC | PRN

## 2022-01-20 NOTE — Progress Notes (Signed)
[facility] PROGRESS NOTE    Progress Note  Melanie Alvarez  HUD:[id number] DOB: 10-24-1955 DOA: 01/15/2022 PCP: Flossie Buffy, NP     Brief Narrative:   Melanie Alvarez is an 66 y.o. 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  CT scan of the abdomen pelvis showed possible abscess IR was consulted to place a drain and 120 mL of purulent material are draining.   Assessment/Plan:   Hepatic abscess: Infectious diseases been consulted. They recommended to change antibiotics to cefazolin and Flagyl p.o. CT scan of the abdomen on 01/19/2022, abscesses greater than 5 cm.,  Pigtail catheter is in place Further management per ID.  Pancreatic cancer: Follow-up with Dr. Marin Olp for future treatment.  Portal vein thrombosis: No signs of bleeding resume Eliquis.  Constipation: Constipation is resolved has had multiple bowel movements.  Dyspnea on exertion: Of unclear etiology may be related to ascites. CT PE small bilateral effusions unlikely to be contributing. Now resolved.  Hypovolemic hyponatremia: Improving with fluid restriction basic metabolic panels pending this morning.  Orthostatic hypotension: Now resolved.  Diabetes mellitus type 2 not on insulin: Hold metformin and glipizide, continue sliding scale.  Hyperlipidemia: Continue statins.  Chronic pancreatic insufficiency: Continue Creon.  Essential hypertension: Pressures well controlled, continue Norvasc and Avapro.  Morbid Obesity: Counseled.   DVT prophylaxis: none Family Communication:none Status is: Observation The patient will require care spanning > 2 midnights and  should be moved to inpatient because: Hepatic abscess requiring IV antibiotics which will need aspiration and drainage    Code Status:     Code Status Orders  (From admission, onward)           Start     Ordered   01/15/22 1903  Do not attempt resuscitation (DNR)  Continuous       Question Answer Comment  In the event of cardiac or respiratory ARREST Do not call a "code blue"   In the event of cardiac or respiratory ARREST Do not perform Intubation, CPR, defibrillation or ACLS   In the event of cardiac or respiratory ARREST Use medication by any route, position, wound care, and other measures to relive pain and suffering. May use oxygen, suction and manual treatment of airway obstruction as needed for comfort.      01/15/22 1904           Code Status History     Date Active Date Inactive Code Status Order ID Comments User Context   09/20/2019 2011 09/26/2019 1745 Full Code [id number]  Jani Gravel, MD ED         IV Access:   Peripheral IV   Procedures and diagnostic studies:   CT ABDOMEN PELVIS WO CONTRAST  Result Date: 01/19/2022 CLINICAL DATA:  Intra-abdominal abscess follow up abscess. 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 RADIATION DOSE REDUCTION: This exam was performed according to the departmental dose-optimization program which includes automated exposure control, adjustment of the mA and/or kV according to patient size and/or use of  iterative reconstruction technique. COMPARISON:  CT abdomen pelvis 01/15/2022 FINDINGS: Lower chest: Bilateral lower lobe subsegmental atelectasis. Hepatobiliary: No focal liver abnormality. No gallstones, gallbladder wall thickening, or  pericholecystic fluid. No biliary dilatation.Redemonstration of a common bile duct stent in stable position. Persistent pneumobilia Pancreas: Grossly unremarkable known pancreatic mass is again noted along the proximal pancreas and difficult to measure on this noncontrast study. Normal pancreatic contour. No surrounding inflammatory changes. No main pancreatic ductal dilatation. Spleen: Normal in size without focal abnormality. Adrenals/Urinary Tract: No adrenal nodule bilaterally. No nephrolithiasis and no hydronephrosis. Redemonstration of a 1.4 cm exophytic right inferior renal pole lesion with a density of -17 Hounsfield units. No ureterolithiasis or hydroureter. The urinary bladder is unremarkable. Stomach/Bowel: Stomach is within normal limits. No evidence of bowel wall thickening or dilatation. Appendix appears normal. Vascular/Lymphatic: No abdominal aorta or iliac aneurysm. Moderate atherosclerotic plaque of the aorta and its branches. No abdominal, pelvic, or inguinal lymphadenopathy. Reproductive: Uterus and bilateral adnexa are unremarkable. Other: Persistent trace to small volume free fluid within the lower abdomen and pelvis. No intraperitoneal free gas. Interval decrease in size of a difficult to measure on this noncontrast study 7 x 5.5 x 7cm gas and fluid collection (from 9.2 x 9.8 cm). The collection is again noted to abut pancreas and liver. It extends to adjacent to the lesser curvature of the stomach. Interval placement of pigtail catheter with tip likely terminating within the collection. Musculoskeletal: No abdominal wall hernia or abnormality. No suspicious lytic or blastic osseous lesions. No acute displaced fracture. IMPRESSION: 1. Interval decrease in size of a upper abdominal difficult to measure on this noncontrast study 7 x 5.5 cm gas and fluid collection (from 9.2 x 9.8 cm). Interval placement of a pigtail catheter which likely terminates within the collection. 2. Grossly unremarkable  known pancreatic mass is again noted along the proximal pancreas and difficult to measure on this noncontrast study. 3. Common bile duct stent in appropriate position. 4. Persistent trace to small volume ascites within the pelvis. 5. Indeterminate 1.4 cm exophytic right inferior renal pole lesion. Consider nonemergent MRI renal protocol for further evaluation. 6.  Aortic Atherosclerosis (ICD10-I70.0). Electronically Signed   By: Iven Finn M.D.   On: 01/19/2022 15:17   DG CHEST PORT 1 VIEW  Result Date: 01/18/2022 CLINICAL DATA:  Left chest discomfort and dyspnea EXAM: PORTABLE CHEST 1 VIEW COMPARISON:  01/15/2022 chest radiograph. FINDINGS: Right internal jugular Port-A-Cath terminates at the cavoatrial junction. Stable cardiomediastinal silhouette with normal heart size. No pneumothorax. No pleural effusion. No pulmonary edema. Faint hazy left lung base opacity, similar, favor atelectasis. IMPRESSION: Faint hazy left lung base opacity, favor atelectasis. Electronically Signed   By: Ilona Sorrel M.D.   On: 01/18/2022 08:24     Medical Consultants:   None.   Subjective:    Melanie Alvarez complaining of gas  Objective:    Vitals:   01/19/22 0615 01/19/22 1307 01/19/22 2115 01/20/22 0547  BP: 121/80 133/74 (!) 147/88 135/77  Pulse: 96 84 90 86  Resp: '18 18 18 18  '$ Temp: 98.2 F (36.8 C) 97.8 F (36.6 C) (!) 97.5 F (36.4 C) 98.1 F (36.7 C)  TempSrc: Oral Oral Oral Oral  SpO2: 92% 92% 95% 92%  Weight:      Height:       SpO2: 92 % O2 Flow Rate (L/min): 2 L/min   Intake/Output Summary (Last 24 hours) at 01/20/2022 0809 Last data filed at 01/20/2022 0612 Gross per 24 hour  Intake 1441.51 ml  Output 126 ml  Net 1315.51 ml    Filed Weights   01/15/22 1116  Weight: 85.7 kg    Exam: General exam: In no acute distress. Respiratory system: Good air movement and clear to auscultation. Cardiovascular system: S1 & S2 heard, RRR. No JVD. Gastrointestinal system: Abdomen is  nondistended, soft and nontender.  Extremities: No pedal edema. Skin: No rashes, lesions or ulcers Psychiatry: Judgement and insight appear normal. Mood & affect appropriate.  Data Reviewed:    Labs: Basic Metabolic Panel: Recent Labs  Lab 01/15/22 1116 01/16/22 0500 01/17/22 1234 01/18/22 0621  NA 128* 131* 132* 134*  K 4.1 3.9 3.1* 3.6  CL 92* 97* 100 99  CO2 '26 26 24 26  '$ GLUCOSE 283* 100* 157* 163*  BUN 13 9 7* 5*  CREATININE 0.67 0.47 0.61 0.48  CALCIUM 8.1* 8.0* 7.5* 8.1*    GFR Estimated Creatinine Clearance: 74.5 mL/min (by C-G formula based on SCr of 0.48 mg/dL). Liver Function Tests: Recent Labs  Lab 01/15/22 1116 01/16/22 0500 01/18/22 0621  AST 41 39 32  ALT 36 32 28  ALKPHOS 105 103 95  BILITOT 0.8 0.7 0.7  PROT 6.9 6.5 6.7  ALBUMIN 2.2* 2.0* 2.1*    Recent Labs  Lab 01/15/22 1116  LIPASE 23    No results for input(s): AMMONIA in the last 168 hours. Coagulation profile Recent Labs  Lab 01/16/22 1325  INR 3.7*    COVID-19 Labs  No results for input(s): DDIMER, FERRITIN, LDH, CRP in the last 72 hours.  Lab Results  Component Value Date   SARSCOV2NAA NEGATIVE 01/15/2020   [place] NEGATIVE 09/20/2019   [place] Not Detected 07/06/2019    CBC: Recent Labs  Lab 01/15/22 1116 01/16/22 0500 01/18/22 0621  WBC 9.1 9.9 9.5  NEUTROABS 6.5  --  6.9  HGB 11.8* 11.0* 11.1*  HCT 36.5 34.5* 33.7*  MCV 85.1 86.0 84.9  PLT 538* 422* 455*    Cardiac Enzymes: No results for input(s): CKTOTAL, CKMB, CKMBINDEX, TROPONINI in the last 168 hours. BNP (last 3 results) No results for input(s): PROBNP in the last 8760 hours. CBG: Recent Labs  Lab 01/19/22 0726 01/19/22 1200 01/19/22 1623 01/19/22 2131 01/20/22 0747  GLUCAP 130* 155* 155* 114* 109*    D-Dimer: No results for input(s): DDIMER in the last 72 hours. Hgb A1c: No results for input(s): HGBA1C in the last 72 hours. Lipid Profile: No results for input(s): CHOL, HDL,  LDLCALC, TRIG, CHOLHDL, LDLDIRECT in the last 72 hours. Thyroid function studies: No results for input(s): TSH, T4TOTAL, T3FREE, THYROIDAB in the last 72 hours.  Invalid input(s): FREET3 Anemia work up: No results for input(s): VITAMINB12, FOLATE, FERRITIN, TIBC, IRON, RETICCTPCT in the last 72 hours. Sepsis Labs: Recent Labs  Lab 01/15/22 1116 01/16/22 0500 01/18/22 0621  WBC 9.1 9.9 9.5    Microbiology Recent Results (from the past 240 hour(s))  Culture, blood (Routine X 2) w Reflex to ID Panel     Status: None (Preliminary result)   Collection Time: 01/16/22  8:45 AM   Specimen: BLOOD  Result Value Ref Range Status   Specimen Description   Final    BLOOD LEFT ANTECUBITAL Performed at [facility] [address], [place], [place] [address]    Special Requests   Final    IN PEDIATRIC BOTTLE Blood Culture adequate volume Performed at [facility] [address] [place], [place] [address]    Culture  Final    NO GROWTH 4 DAYS Performed at [facility], [place] [address], [place], [place] [address]    Report Status PENDING  Incomplete  Culture, blood (Routine X 2) w Reflex to ID Panel     Status: None (Preliminary result)   Collection Time: 01/16/22  8:49 AM   Specimen: BLOOD  Result Value Ref Range Status   Specimen Description   Final    BLOOD RIGHT ANTECUBITAL Performed at [facility] [address] [place], [place] [address]    Special Requests   Final    IN PEDIATRIC BOTTLE Blood Culture adequate volume Performed at [facility] [address], [place], [place] [address]    Culture   Final    NO GROWTH 4 DAYS Performed at [facility], [place] [address], [place], [place] [address]    Report Status PENDING  Incomplete  Aerobic/Anaerobic Culture w Gram Stain (surgical/deep wound)     Status: None (Preliminary result)   Collection Time: 01/16/22 11:50 AM   Specimen: Abscess   Result Value Ref Range Status   Specimen Description   Final    ABSCESS ABDOMINAL Performed at [facility] [address] [place], [place] [address]    Special Requests   Final    NONE Performed at [facility], [place] [address] [place], [place] [address]    Gram Stain   Final    NO WBC SEEN ABUNDANT GRAM POSITIVE COCCI ABUNDANT GRAM POSITIVE RODS ABUNDANT GRAM NEGATIVE RODS    Culture   Final    MODERATE ESCHERICHIA COLI MODERATE STREPTOCOCCUS CONSTELLATUS MODERATE BACTEROIDES INTERMEDIUS BETA LACTAMASE POSITIVE Performed at [facility], [place] [address] [place], [place] [address]    Report Status PENDING  Incomplete   Organism ID, Bacteria ESCHERICHIA COLI  Final      Susceptibility   Escherichia coli - MIC*    AMPICILLIN <=2 SENSITIVE Sensitive     CEFAZOLIN <=4 SENSITIVE Sensitive     CEFEPIME <=0.12 SENSITIVE Sensitive     CEFTAZIDIME <=1 SENSITIVE Sensitive     CEFTRIAXONE <=0.25 SENSITIVE Sensitive     CIPROFLOXACIN <=0.25 SENSITIVE Sensitive     GENTAMICIN <=1 SENSITIVE Sensitive     IMIPENEM <=0.25 SENSITIVE Sensitive     TRIMETH/SULFA <=20 SENSITIVE Sensitive     AMPICILLIN/SULBACTAM <=2 SENSITIVE Sensitive     PIP/TAZO <=4 SENSITIVE Sensitive     * MODERATE ESCHERICHIA COLI     Medications:    amLODipine  10 mg Oral QHS   apixaban  5 mg Oral BID   atorvastatin  20 mg Oral QHS   Chlorhexidine Gluconate Cloth  6 each Topical Daily   feeding supplement  237 mL Oral BID BM   fentaNYL  1 patch Transdermal Q72H   fluconazole  100 mg Oral Daily   insulin aspart  0-15 Units Subcutaneous TID WC   insulin aspart  0-5 Units Subcutaneous QHS   insulin detemir  5 Units Subcutaneous BID   irbesartan  37.5 mg Oral Daily   lipase/protease/amylase  72,000 Units Oral TID AC   metoprolol succinate  25 mg Oral Daily   multivitamin with minerals  1 tablet Oral Daily   pantoprazole  40 mg Oral BID   potassium chloride SA   20 mEq Oral BID   sodium chloride flush  10-40 mL Intracatheter Q12H   sodium chloride flush  5 mL Intracatheter Q8H   Continuous Infusions:  sodium chloride 10 mL/hr at 01/17/22 1107   ampicillin-sulbactam (UNASYN) IV 3 g (01/20/22 0409)      LOS: 4 days   Charlynne Cousins  [facility]  01/20/2022, 8:09 AM

## 2022-01-21 ENCOUNTER — Inpatient Hospital Stay: Payer: BC Managed Care – PPO

## 2022-01-21 ENCOUNTER — Inpatient Hospital Stay: Payer: BC Managed Care – PPO | Admitting: Hematology & Oncology

## 2022-01-21 DIAGNOSIS — K769 Liver disease, unspecified: Secondary | ICD-10-CM | POA: Diagnosis not present

## 2022-01-21 DIAGNOSIS — B954 Other streptococcus as the cause of diseases classified elsewhere: Secondary | ICD-10-CM

## 2022-01-21 DIAGNOSIS — B952 Enterococcus as the cause of diseases classified elsewhere: Secondary | ICD-10-CM | POA: Diagnosis not present

## 2022-01-21 LAB — GLUCOSE, CAPILLARY
Glucose-Capillary: 194 mg/dL — ABNORMAL HIGH (ref 70–99)
Glucose-Capillary: 167 mg/dL — ABNORMAL HIGH (ref 70–99)
Glucose-Capillary: 134 mg/dL — ABNORMAL HIGH (ref 70–99)
Glucose-Capillary: 163 mg/dL — ABNORMAL HIGH (ref 70–99)

## 2022-01-21 LAB — CULTURE, BLOOD (ROUTINE X 2)
Culture: NO GROWTH
Culture: NO GROWTH
Special Requests: ADEQUATE
Special Requests: ADEQUATE

## 2022-01-21 LAB — CYTOLOGY - NON PAP

## 2022-01-21 NOTE — Progress Notes (Signed)
[facility] PROGRESS NOTE    Progress Note  Melanie Alvarez  ZDG:[id number] DOB: 03/25/1956 DOA: 01/15/2022 PCP: Flossie Buffy, NP     Brief Narrative:   Melanie Alvarez is an 66 y.o. 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  CT scan of the abdomen pelvis showed possible abscess IR was consulted to place a drain and 120 mL of purulent material are draining.   Assessment/Plan:   Hepatic abscess: Infectious diseases been consulted. They recommended to change antibiotics to cefazolin and Flagyl p.o. CT scan on 01/19/2022 showed a hepatic abscess greater than 5 cm with gas fluid improving in size. Continue antibiotics, further management per ID.  Pancreatic cancer: Follow-up with Dr. Marin Olp for future treatment.  Portal vein thrombosis: No signs of bleeding resume Eliquis.  Constipation: Constipation is resolved has had multiple bowel movements.  Dyspnea on exertion: Of unclear etiology may be related to ascites. CT PE small bilateral effusions unlikely to be contributing. Now resolved.  Hypovolemic hyponatremia: Resolved with fluid resuscitation.  Orthostatic hypotension: Now resolved.  Diabetes mellitus type 2 not on insulin: Hold metformin and glipizide, continue sliding scale.  Hyperlipidemia: Continue statins.  Chronic pancreatic insufficiency: Continue Creon.  Essential hypertension: Pressures well controlled, continue Norvasc and Avapro.  Morbid Obesity: Counseled.   DVT prophylaxis: none Family Communication:none Status is: Observation The patient will require care spanning > 2 midnights and should be moved to  inpatient because: Hepatic abscess requiring IV antibiotics which will need aspiration and drainage    Code Status:     Code Status Orders  (From admission, onward)           Start     Ordered   01/15/22 1903  Do not attempt resuscitation (DNR)  Continuous       Question Answer Comment  In the event of cardiac or respiratory ARREST Do not call a "code blue"   In the event of cardiac or respiratory ARREST Do not perform Intubation, CPR, defibrillation or ACLS   In the event of cardiac or respiratory ARREST Use medication by any route, position, wound care, and other measures to relive pain and suffering. May use oxygen, suction and manual treatment of airway obstruction as needed for comfort.      01/15/22 1904           Code Status History     Date Active Date Inactive Code Status Order ID Comments User Context   09/20/2019 2011 09/26/2019 1745 Full Code [id number]  Jani Gravel, MD ED         IV Access:   Peripheral IV   Procedures and diagnostic studies:   CT ABDOMEN PELVIS WO CONTRAST  Result Date: 01/19/2022 CLINICAL DATA:  Intra-abdominal abscess follow up abscess. 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 RADIATION DOSE REDUCTION: This exam was performed according to the departmental dose-optimization program which includes automated exposure control, adjustment of the mA and/or kV according to patient size and/or use of iterative reconstruction technique. COMPARISON:  CT abdomen pelvis 01/15/2022 FINDINGS: Lower chest: Bilateral lower lobe subsegmental atelectasis. Hepatobiliary: No focal liver abnormality. No gallstones, gallbladder wall thickening, or pericholecystic fluid.  No biliary dilatation.Redemonstration of a common bile duct stent in stable position. Persistent pneumobilia Pancreas: Grossly unremarkable known pancreatic mass is again noted along the proximal pancreas and difficult to measure on this noncontrast study. Normal pancreatic contour. No surrounding inflammatory changes. No main pancreatic ductal dilatation. Spleen: Normal in size without focal abnormality. Adrenals/Urinary Tract: No adrenal nodule bilaterally. No nephrolithiasis and no hydronephrosis. Redemonstration of a 1.4 cm exophytic right inferior renal pole lesion with a density of -17 Hounsfield units. No ureterolithiasis or hydroureter. The urinary bladder is unremarkable. Stomach/Bowel: Stomach is within normal limits. No evidence of bowel wall thickening or dilatation. Appendix appears normal. Vascular/Lymphatic: No abdominal aorta or iliac aneurysm. Moderate atherosclerotic plaque of the aorta and its branches. No abdominal, pelvic, or inguinal lymphadenopathy. Reproductive: Uterus and bilateral adnexa are unremarkable. Other: Persistent trace to small volume free fluid within the lower abdomen and pelvis. No intraperitoneal free gas. Interval decrease in size of a difficult to measure on this noncontrast study 7 x 5.5 x 7cm gas and fluid collection (from 9.2 x 9.8 cm). The collection is again noted to abut pancreas and liver. It extends to adjacent to the lesser curvature of the stomach. Interval placement of pigtail catheter with tip likely terminating within the collection. Musculoskeletal: No abdominal wall hernia or abnormality. No suspicious lytic or blastic osseous lesions. No acute displaced fracture. IMPRESSION: 1. Interval decrease in size of a upper abdominal difficult to measure on this noncontrast study 7 x 5.5 cm gas and fluid collection (from 9.2 x 9.8 cm). Interval placement of a pigtail catheter which likely terminates within the collection. 2. Grossly unremarkable known pancreatic mass  is again noted along the proximal pancreas and difficult to measure on this noncontrast study. 3. Common bile duct stent in appropriate position. 4. Persistent trace to small volume ascites within the pelvis. 5. Indeterminate 1.4 cm exophytic right inferior renal pole lesion. Consider nonemergent MRI renal protocol for further evaluation. 6.  Aortic Atherosclerosis (ICD10-I70.0). Electronically Signed   By: Iven Finn M.D.   On: 01/19/2022 15:17     Medical Consultants:   None.   Subjective:    Melanie Alvarez no new complaints today.  Objective:    Vitals:   01/20/22 0547 01/20/22 1657 01/20/22 2226 01/21/22 0519  BP: 135/77 133/82 133/83 127/82  Pulse: 86 86 85 73  Resp: '18 18 16 18  '$ Temp: 98.1 F (36.7 C) 98.4 F (36.9 C) 98.1 F (36.7 C) 97.8 F (36.6 C)  TempSrc: Oral Oral Oral Oral  SpO2: 92% 92% 93% 93%  Weight:      Height:       SpO2: 93 % O2 Flow Rate (L/min): 2 L/min   Intake/Output Summary (Last 24 hours) at 01/21/2022 0735 Last data filed at 01/21/2022 0523 Gross per 24 hour  Intake 730 ml  Output 26 ml  Net 704 ml    Filed Weights   01/15/22 1116  Weight: 85.7 kg    Exam: General exam: In no acute distress. Respiratory system: Good air movement and clear to auscultation. Cardiovascular system: S1 & S2 heard, RRR. No JVD. Gastrointestinal system: Abdomen is nondistended, soft and nontender.  Extremities: No pedal edema. Skin: No rashes, lesions or ulcers Psychiatry: Judgement and insight appear normal. Mood & affect appropriate.  Data Reviewed:    Labs: Basic Metabolic Panel:  Recent Labs  Lab 01/15/22 1116 01/16/22 0500 01/17/22 1234 01/18/22 0621  NA 128* 131* 132* 134*  K 4.1 3.9 3.1* 3.6  CL 92* 97* 100 99  CO2 '26 26 24 26  '$ GLUCOSE 283* 100* 157* 163*  BUN 13 9 7* 5*  CREATININE 0.67 0.47 0.61 0.48  CALCIUM 8.1* 8.0* 7.5* 8.1*    GFR Estimated Creatinine Clearance: 74.5 mL/min (by C-G formula based on SCr of 0.48  mg/dL). Liver Function Tests: Recent Labs  Lab 01/15/22 1116 01/16/22 0500 01/18/22 0621  AST 41 39 32  ALT 36 32 28  ALKPHOS 105 103 95  BILITOT 0.8 0.7 0.7  PROT 6.9 6.5 6.7  ALBUMIN 2.2* 2.0* 2.1*    Recent Labs  Lab 01/15/22 1116  LIPASE 23    No results for input(s): AMMONIA in the last 168 hours. Coagulation profile Recent Labs  Lab 01/16/22 1325  INR 3.7*    COVID-19 Labs  No results for input(s): DDIMER, FERRITIN, LDH, CRP in the last 72 hours.  Lab Results  Component Value Date   SARSCOV2NAA NEGATIVE 01/15/2020   [place] NEGATIVE 09/20/2019   [place] Not Detected 07/06/2019    CBC: Recent Labs  Lab 01/15/22 1116 01/16/22 0500 01/18/22 0621  WBC 9.1 9.9 9.5  NEUTROABS 6.5  --  6.9  HGB 11.8* 11.0* 11.1*  HCT 36.5 34.5* 33.7*  MCV 85.1 86.0 84.9  PLT 538* 422* 455*    Cardiac Enzymes: No results for input(s): CKTOTAL, CKMB, CKMBINDEX, TROPONINI in the last 168 hours. BNP (last 3 results) No results for input(s): PROBNP in the last 8760 hours. CBG: Recent Labs  Lab 01/19/22 2131 01/20/22 0747 01/20/22 1207 01/20/22 1654 01/20/22 2208  GLUCAP 114* 109* 155* 191* 92    D-Dimer: No results for input(s): DDIMER in the last 72 hours. Hgb A1c: No results for input(s): HGBA1C in the last 72 hours. Lipid Profile: No results for input(s): CHOL, HDL, LDLCALC, TRIG, CHOLHDL, LDLDIRECT in the last 72 hours. Thyroid function studies: No results for input(s): TSH, T4TOTAL, T3FREE, THYROIDAB in the last 72 hours.  Invalid input(s): FREET3 Anemia work up: No results for input(s): VITAMINB12, FOLATE, FERRITIN, TIBC, IRON, RETICCTPCT in the last 72 hours. Sepsis Labs: Recent Labs  Lab 01/15/22 1116 01/16/22 0500 01/18/22 0621  WBC 9.1 9.9 9.5    Microbiology Recent Results (from the past 240 hour(s))  Culture, blood (Routine X 2) w Reflex to ID Panel     Status: None (Preliminary result)   Collection Time: 01/16/22  8:45 AM    Specimen: BLOOD  Result Value Ref Range Status   Specimen Description   Final    BLOOD LEFT ANTECUBITAL Performed at [facility] [address], [place], [place] [address]    Special Requests   Final    IN PEDIATRIC BOTTLE Blood Culture adequate volume Performed at [facility] [address] [place], [place] [address]    Culture   Final    NO GROWTH 4 DAYS Performed at [facility], [place] [address], [place], [place] [address]    Report Status PENDING  Incomplete  Culture, blood (Routine X 2) w Reflex to ID Panel     Status: None (Preliminary result)   Collection Time: 01/16/22  8:49 AM   Specimen: BLOOD  Result Value Ref Range Status   Specimen Description   Final    BLOOD RIGHT ANTECUBITAL Performed at [facility] [address] [place],  [place] [address]    Special Requests   Final    IN PEDIATRIC BOTTLE Blood Culture adequate volume Performed at [facility] [address] [place], [place] [address]    Culture   Final    NO GROWTH 4 DAYS Performed at [facility], [place] [address], [place], [place] [address]    Report Status PENDING  Incomplete  Aerobic/Anaerobic Culture w Gram Stain (surgical/deep wound)     Status: None   Collection Time: 01/16/22 11:50 AM   Specimen: Abscess  Result Value Ref Range Status   Specimen Description   Final    ABSCESS ABDOMINAL Performed at [facility] [address], [place], [place] [address]    Special Requests   Final    NONE Performed at [facility], [place] [address] [place] [address] [place], [place] [address]    Gram Stain   Final    NO WBC SEEN ABUNDANT GRAM POSITIVE COCCI ABUNDANT GRAM POSITIVE RODS ABUNDANT GRAM NEGATIVE RODS    Culture   Final    MODERATE ESCHERICHIA COLI MODERATE STREPTOCOCCUS CONSTELLATUS MODERATE BACTEROIDES INTERMEDIUS BETA LACTAMASE POSITIVE Performed at [facility],  [place] [address] [place], [place] [address]    Report Status 01/20/2022 FINAL  Final   Organism ID, Bacteria ESCHERICHIA COLI  Final   Organism ID, Bacteria STREPTOCOCCUS CONSTELLATUS  Final      Susceptibility   Streptococcus constellatus - MIC*    PENICILLIN <=0.06 SENSITIVE Sensitive     CEFTRIAXONE <=0.12 SENSITIVE Sensitive     ERYTHROMYCIN <=0.12 SENSITIVE Sensitive     LEVOFLOXACIN <=0.25 SENSITIVE Sensitive     VANCOMYCIN 0.25 SENSITIVE Sensitive     * MODERATE STREPTOCOCCUS CONSTELLATUS   Escherichia coli - MIC*    AMPICILLIN <=2 SENSITIVE Sensitive     CEFAZOLIN <=4 SENSITIVE Sensitive     CEFEPIME <=0.12 SENSITIVE Sensitive     CEFTAZIDIME <=1 SENSITIVE Sensitive     CEFTRIAXONE <=0.25 SENSITIVE Sensitive     CIPROFLOXACIN <=0.25 SENSITIVE Sensitive     GENTAMICIN <=1 SENSITIVE Sensitive     IMIPENEM <=0.25 SENSITIVE Sensitive     TRIMETH/SULFA <=20 SENSITIVE Sensitive     AMPICILLIN/SULBACTAM <=2 SENSITIVE Sensitive     PIP/TAZO <=4 SENSITIVE Sensitive     * MODERATE ESCHERICHIA COLI     Medications:    amLODipine  10 mg Oral QHS   apixaban  5 mg Oral BID   atorvastatin  20 mg Oral QHS   Chlorhexidine Gluconate Cloth  6 each Topical Daily   feeding supplement  237 mL Oral BID BM   fentaNYL  1 patch Transdermal Q72H   fluconazole  100 mg Oral Daily   insulin aspart  0-15 Units Subcutaneous TID WC   insulin aspart  0-5 Units Subcutaneous QHS   insulin detemir  5 Units Subcutaneous BID   irbesartan  37.5 mg Oral Daily   lipase/protease/amylase  72,000 Units Oral TID AC   metoprolol succinate  25 mg Oral Daily   multivitamin with minerals  1 tablet Oral Daily   pantoprazole  40 mg Oral BID   potassium chloride SA  20 mEq Oral BID   sodium chloride flush  10-40 mL Intracatheter Q12H   sodium chloride flush  5 mL Intracatheter Q8H   Continuous Infusions:  sodium chloride Stopped (01/20/22 0958)   ampicillin-sulbactam (UNASYN) IV 3 g (01/21/22 0441)      LOS: 5  days   Charlynne Cousins  [facility]  01/21/2022, 7:35 AM

## 2022-01-21 NOTE — Progress Notes (Signed)
Melanie Alvarez is about the same.  She had a CT of the abdomen over the weekend.  This abscess is still fairly decent in size.  It is grown E. coli.  I am unsure how long she is going need to have the drainage catheter in.  She does not have any obvious pain.  She says there is little bit of discomfort in the epigastric area.  She has had no fever.  She is eating okay.  There is no nausea or vomiting.  She has had no cough.  She is out of bed.  She is going to the bathroom.  Again, I am not sure what else needs to be done with this abscess.  I am sure that ID will help out with this.  Her blood sugars have been on the high side.  I do not know if this might be an issue with respect to this abscess in the infection.  Again I am not sure if this abscess is from a metastasis.  She did not have any metastasis on a scan back in April.  On the CT scan recently, there is no mention of a metastasis.  Again I hope that cytology was taken on the fluid that was removed when she had the drainage catheter put in.  Somehow, I do not know if this ever was done.  It probably would not be a bad idea to get a specimen for cytology.  She still is having some drainage.  Her vital signs are temperature 97.8.  Pulse 73.  Blood pressure 127/82.  Her abdomen is soft.  Bowel sounds are present.  She has no fluid wave.  There is no obvious abdominal mass.  There is no guarding or rebound.  Lungs are clear.  Cardiac exam regular rate and rhythm.  Extremity shows no clubbing, cyanosis or edema.  Melanie Alvarez has this E. coli abscess.  She has a drainage catheter in.  Again I am not sure how this is related to her underlying malignancy.  However, I am sure that there has been some type of correlation with her pancreatic cancer.  I do appreciate the great help she is getting from the staff up on 6 E.   Lattie Haw, MD  Romans 1:16

## 2022-01-21 NOTE — Progress Notes (Signed)
[facility] for Infectious Disease   Reason for visit: Follow up on liver abscess  Interval History: no acute events; remains afebrile.   Day 7 total antibiotics  Physical Exam: Constitutional:  Vitals:   01/21/22 0519 01/21/22 1358  BP: 127/82 119/71  Pulse: 73 86  Resp: 18 16  Temp: 97.8 F (36.6 C) 98.4 F (36.9 C)  SpO2: 93% 93%   patient appears in NAD Respiratory: Normal respiratory effort   Review of Systems: Constitutional: negative for fevers and chills  Lab Results  Component Value Date   WBC 9.5 01/18/2022   HGB 11.1 (L) 01/18/2022   HCT 33.7 (L) 01/18/2022   MCV 84.9 01/18/2022   PLT 455 (H) 01/18/2022    Lab Results  Component Value Date   CREATININE 0.48 01/18/2022   BUN 5 (L) 01/18/2022   NA 134 (L) 01/18/2022   K 3.6 01/18/2022   CL 99 01/18/2022   CO2 26 01/18/2022    Lab Results  Component Value Date   ALT 28 01/18/2022   AST 32 01/18/2022   GGT 69 (H) 11/25/2017   ALKPHOS 95 01/18/2022     Microbiology: Recent Results (from the past 240 hour(s))  Culture, blood (Routine X 2) w Reflex to ID Panel     Status: None   Collection Time: 01/16/22  8:45 AM   Specimen: BLOOD  Result Value Ref Range Status   Specimen Description   Final    BLOOD LEFT ANTECUBITAL Performed at [facility], [place] [address] [place], [place] [address]    Special Requests   Final    IN PEDIATRIC BOTTLE Blood Culture adequate volume Performed at [facility] [address], [place], [place] [address]    Culture   Final    NO GROWTH 5 DAYS Performed at [facility], [place] [address], [place], [place] [address]    Report Status 01/21/2022 FINAL  Final  Culture, blood (Routine X 2) w Reflex to ID Panel     Status: None   Collection Time: 01/16/22  8:49 AM   Specimen: BLOOD  Result Value Ref Range Status   Specimen Description   Final    BLOOD RIGHT ANTECUBITAL Performed at [facility] [address] [place], [place] [address]    Special Requests   Final    IN PEDIATRIC BOTTLE Blood Culture adequate volume Performed at [facility] [address], [place], [place] [address]    Culture   Final    NO GROWTH 5 DAYS Performed at [facility], [place] [address], [place], [place] [address]    Report Status 01/21/2022 FINAL  Final  Aerobic/Anaerobic Culture w Gram Stain (surgical/deep wound)     Status: None   Collection Time: 01/16/22 11:50 AM   Specimen: Abscess  Result Value Ref Range Status   Specimen Description   Final    ABSCESS ABDOMINAL Performed at [facility] [address] [place], [place] [address]    Special Requests   Final    NONE Performed at [facility], [place] [address] [place],  [address]    Gram Stain   Final    NO WBC SEEN ABUNDANT GRAM POSITIVE COCCI ABUNDANT GRAM POSITIVE RODS ABUNDANT GRAM NEGATIVE RODS    Culture   Final    MODERATE ESCHERICHIA COLI MODERATE STREPTOCOCCUS CONSTELLATUS MODERATE BACTEROIDES INTERMEDIUS BETA LACTAMASE POSITIVE Performed at [facility],  [address], [place], [place] [address]    Report Status 01/20/2022 FINAL  Final   Organism ID, Bacteria ESCHERICHIA COLI  Final   Organism ID, Bacteria STREPTOCOCCUS CONSTELLATUS  Final      Susceptibility   Streptococcus constellatus - MIC*    PENICILLIN <=0.06 SENSITIVE Sensitive     CEFTRIAXONE <=0.12 SENSITIVE Sensitive     ERYTHROMYCIN <=0.12 SENSITIVE Sensitive     LEVOFLOXACIN <=0.25 SENSITIVE Sensitive     VANCOMYCIN 0.25 SENSITIVE Sensitive     * MODERATE STREPTOCOCCUS CONSTELLATUS   Escherichia coli - MIC*    AMPICILLIN <=2 SENSITIVE Sensitive     CEFAZOLIN <=4 SENSITIVE Sensitive     CEFEPIME <=0.12 SENSITIVE Sensitive     CEFTAZIDIME <=1 SENSITIVE Sensitive     CEFTRIAXONE <=0.25 SENSITIVE Sensitive     CIPROFLOXACIN <=0.25 SENSITIVE Sensitive     GENTAMICIN <=1  SENSITIVE Sensitive     IMIPENEM <=0.25 SENSITIVE Sensitive     TRIMETH/SULFA <=20 SENSITIVE Sensitive     AMPICILLIN/SULBACTAM <=2 SENSITIVE Sensitive     PIP/TAZO <=4 SENSITIVE Sensitive     * MODERATE ESCHERICHIA COLI    Impression/Plan:  1. Liver abscess - repeat CT noted and abscess decreased compared to previous CT.  Patient otherwise stable and on ampicillin/sulbactam.  Multiple organisms in culture inlcuding E coli, Streptococcus, constellatus, Bacteroides intermedius.   Continue monitoring drainage and follow up for drain removal per IR.  At this point, she will need prolonged antibiotics and can use Augmentin at discharge for 6 weeks.     2.  Leukocytosis - has trended down to normal and has remained wnl.    3.  Disposition - has follow up with ID/Dr. Gale Journey on 7/5.  Ok from [place] standpoint for discharge when ready otherwise.

## 2022-01-21 NOTE — Progress Notes (Signed)
Referring Physician(s): Kathie Dike, MD  Supervising Physician: Michaelle Birks  Patient Status:  [facility] - In-pt  Chief Complaint:  Hepatic abscess, s/p drain placement by Dr. Vernard Gambles on 01/16/22.   Subjective:  Pt laying in bed, NAD. States that she is doing the same compared to yesterday.  Mild persistent RUQ abdominal pain, no fever, chills, N/V.   Allergies: Lisinopril  Medications: Prior to Admission medications   Medication Sig Start Date End Date Taking? Authorizing Provider  amLODipine (NORVASC) 10 MG tablet Take 1 tablet (10 mg total) by mouth at bedtime. 11/27/21  Yes Nche, Charlene Brooke, NP  APIXABAN Arne [place]) VTE STARTER PACK ('10MG'$  AND '5MG'$ ) Take as directed on package: start with two-'5mg'$  tablets twice daily for 7 days. On day 8, switch to one-'5mg'$  tablet twice daily. Patient taking differently: Take 5 mg by mouth 2 (two) times daily. 12/24/21  Yes Volanda Napoleon, MD  atorvastatin (LIPITOR) 20 MG tablet Take 1 tablet (20 mg total) by mouth at bedtime. 01/25/21  Yes Nche, Charlene Brooke, NP  CREON 36000-114000 units CPEP capsule TAKE 2 CAPSULES BY MOUTH THREE TIMES DAILY BEFORE MEAL(S) Patient taking differently: Take 72,000 Units by mouth 3 (three) times daily before meals. 11/19/21  Yes Volanda Napoleon, MD  feeding supplement (BOOST HIGH PROTEIN) LIQD Take 2 Containers by mouth in the morning.   Yes [provider]  fentaNYL (DURAGESIC) 25 MCG/HR Place 1 patch onto the skin every 3 (three) days. 01/02/22  Yes Volanda Napoleon, MD  fluticasone (FLONASE) 50 MCG/ACT nasal spray Place 2 sprays into both nostrils daily. Patient taking differently: Place 2 sprays into both nostrils daily as needed for allergies or rhinitis. 05/14/21  Yes Tower, Wynelle Fanny, MD  glipiZIDE (GLUCOTROL XL) 5 MG 24 hr tablet Take 1 tablet (5 mg total) by mouth daily with breakfast. 09/14/21  Yes Nche, Charlene Brooke, NP  HYDROmorphone (DILAUDID) 4 MG tablet Take 1 tablet (4 mg total) by mouth every 4  (four) hours as needed for severe pain. 01/02/22  Yes Volanda Napoleon, MD  lidocaine-prilocaine (EMLA) cream Apply to affected area once Patient taking differently: 1 application. as needed (for port access). 12/26/21  Yes Volanda Napoleon, MD  metFORMIN (GLUCOPHAGE XR) 750 MG 24 hr tablet Take 1 tablet (750 mg total) by mouth daily with breakfast. 09/14/21  Yes Nche, Charlene Brooke, NP  olmesartan (BENICAR) 5 MG tablet Take 1 tablet (5 mg total) by mouth daily. 01/25/21  Yes Nche, Charlene Brooke, NP  ondansetron (ZOFRAN) 8 MG tablet Take 1 tablet (8 mg total) by mouth 2 (two) times daily as needed (Nausea or vomiting). Patient taking differently: Take 8 mg by mouth 2 (two) times daily as needed for nausea or vomiting. 12/26/21  Yes Ennever, Rudell Cobb, MD  pantoprazole (PROTONIX) 40 MG tablet Take 1 tablet (40 mg total) by mouth daily. 12/24/21  Yes Ennever, Rudell Cobb, MD  prochlorperazine (COMPAZINE) 10 MG tablet Take 1 tablet (10 mg total) by mouth every 6 (six) hours as needed (Nausea or vomiting). Patient taking differently: Take 10 mg by mouth every 6 (six) hours as needed for nausea or vomiting. 12/26/21  Yes Ennever, Rudell Cobb, MD  TYLENOL 8 HOUR 650 MG CR tablet Take 650-1,300 mg by mouth every 8 (eight) hours as needed for pain.   Yes [provider]  glucose blood test strip Test blood glucose once daily. One Touch Verio test strips 09/28/19   Nche, Charlene Brooke, NP  metoprolol succinate (  TOPROL-XL) 25 MG 24 hr tablet TAKE 1 TABLET BY MOUTH ONCE DAILY Patient not taking: Reported on 01/15/2022 01/25/21   Nche, Charlene Brooke, NP  pantoprazole (PROTONIX) 40 MG tablet Take 1 tablet (40 mg total) by mouth 2 (two) times daily for 14 days. Patient not taking: Reported on 01/15/2022 12/04/21 12/18/21  Nche, Charlene Brooke, NP  potassium chloride (KLOR-CON M) 20 MEQ tablet Take 1 tablet (20 mEq total) by mouth 2 (two) times daily. Patient not taking: Reported on 01/15/2022 11/27/21   Flossie Buffy, NP      Vital Signs: BP 127/82 (BP Location: Left Arm)   Pulse 73   Temp 97.8 F (36.6 C) (Oral)   Resp 18   Ht 5' 3.5" (1.613 m)   Wt 189 lb (85.7 kg)   SpO2 93%   BMI 32.95 kg/m   Physical Exam Vitals reviewed.  Constitutional:      General: She is not in acute distress.    Appearance: She is well-developed. She is not ill-appearing.  Pulmonary:     Effort: Pulmonary effort is normal.  Skin:    General: Skin is warm and dry.     Coloration: Skin is not cyanotic or pale.     Comments: Positive RUQ drain to gravity bag. Site is unremarkable with no erythema, edema, tenderness, bleeding or drainage. Suture and stat lock in place. Dressing is clean, dry, and intact. 10 ml of  cream colored fluid noted in the bag. Drain aspirates and flushes well.    Neurological:     Mental Status: She is alert and oriented to person, place, and time.  Psychiatric:        Mood and Affect: Mood normal.        Behavior: Behavior normal.    Imaging: CT ABDOMEN PELVIS WO CONTRAST  Result Date: 01/19/2022 CLINICAL DATA:  Intra-abdominal abscess follow up abscess. 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 RADIATION DOSE REDUCTION: This exam was performed according to the departmental dose-optimization program which includes automated exposure control, adjustment of the mA and/or kV according to patient size and/or use of iterative reconstruction technique. COMPARISON:  CT abdomen pelvis 01/15/2022 FINDINGS: Lower chest: Bilateral lower lobe subsegmental atelectasis. Hepatobiliary: No focal liver abnormality. No gallstones, gallbladder wall thickening, or pericholecystic fluid. No biliary  dilatation.Redemonstration of a common bile duct stent in stable position. Persistent pneumobilia Pancreas: Grossly unremarkable known pancreatic mass is again noted along the proximal pancreas and difficult to measure on this noncontrast study. Normal pancreatic contour. No surrounding inflammatory changes. No main pancreatic ductal dilatation. Spleen: Normal in size without focal abnormality. Adrenals/Urinary Tract: No adrenal nodule bilaterally. No nephrolithiasis and no hydronephrosis. Redemonstration of a 1.4 cm exophytic right inferior renal pole lesion with a density of -17 Hounsfield units. No ureterolithiasis or hydroureter. The urinary bladder is unremarkable. Stomach/Bowel: Stomach is within normal limits. No evidence of bowel wall thickening or dilatation. Appendix appears normal. Vascular/Lymphatic: No abdominal aorta or iliac aneurysm. Moderate atherosclerotic plaque of the aorta and its branches. No abdominal, pelvic, or inguinal lymphadenopathy. Reproductive: Uterus and bilateral adnexa are unremarkable. Other: Persistent trace to small volume free fluid within the lower abdomen and pelvis. No intraperitoneal free gas. Interval decrease in size of a difficult to measure on this  noncontrast study 7 x 5.5 x 7cm gas and fluid collection (from 9.2 x 9.8 cm). The collection is again noted to abut pancreas and liver. It extends to adjacent to the lesser curvature of the stomach. Interval placement of pigtail catheter with tip likely terminating within the collection. Musculoskeletal: No abdominal wall hernia or abnormality. No suspicious lytic or blastic osseous lesions. No acute displaced fracture. IMPRESSION: 1. Interval decrease in size of a upper abdominal difficult to measure on this noncontrast study 7 x 5.5 cm gas and fluid collection (from 9.2 x 9.8 cm). Interval placement of a pigtail catheter which likely terminates within the collection. 2. Grossly unremarkable known pancreatic mass is again  noted along the proximal pancreas and difficult to measure on this noncontrast study. 3. Common bile duct stent in appropriate position. 4. Persistent trace to small volume ascites within the pelvis. 5. Indeterminate 1.4 cm exophytic right inferior renal pole lesion. Consider nonemergent MRI renal protocol for further evaluation. 6.  Aortic Atherosclerosis (ICD10-I70.0). Electronically Signed   By: Iven Finn M.D.   On: 01/19/2022 15:17   DG CHEST PORT 1 VIEW  Result Date: 01/18/2022 CLINICAL DATA:  Left chest discomfort and dyspnea EXAM: PORTABLE CHEST 1 VIEW COMPARISON:  01/15/2022 chest radiograph. FINDINGS: Right internal jugular Port-A-Cath terminates at the cavoatrial junction. Stable cardiomediastinal silhouette with normal heart size. No pneumothorax. No pleural effusion. No pulmonary edema. Faint hazy left lung base opacity, similar, favor atelectasis. IMPRESSION: Faint hazy left lung base opacity, favor atelectasis. Electronically Signed   By: Ilona Sorrel M.D.   On: 01/18/2022 08:24    Labs:  CBC: Recent Labs    01/09/22 0821 01/15/22 1116 01/16/22 0500 01/18/22 0621  WBC 7.6 9.1 9.9 9.5  HGB 7.4* 11.8* 11.0* 11.1*  HCT 22.7* 36.5 34.5* 33.7*  PLT 298 538* 422* 455*     COAGS: Recent Labs    01/16/22 1325  INR 3.7*     BMP: Recent Labs    01/15/22 1116 01/16/22 0500 01/17/22 1234 01/18/22 0621  NA 128* 131* 132* 134*  K 4.1 3.9 3.1* 3.6  CL 92* 97* 100 99  CO2 '26 26 24 26  '$ GLUCOSE 283* 100* 157* 163*  BUN 13 9 7* 5*  CALCIUM 8.1* 8.0* 7.5* 8.1*  CREATININE 0.67 0.47 0.61 0.48  GFRNONAA >60 >60 >60 >60     LIVER FUNCTION TESTS: Recent Labs    01/09/22 0821 01/15/22 1116 01/16/22 0500 01/18/22 0621  BILITOT 0.8 0.8 0.7 0.7  AST 46* 41 39 32  ALT 43 36 32 28  ALKPHOS 128* 105 103 95  PROT 6.7 6.9 6.5 6.7  ALBUMIN 2.8* 2.2* 2.0* 2.1*     Assessment and Plan:  66 y.o. female with hepatic abscess s/p drain placement by Dr. Vernard Gambles on  01/16/22.   VSS, afebrile No CBC today  OP purulent, 25 mL yesterday  Cx STREPTOCOCCUS CONSTELLATUS; ESCHERICHIA COLI  Cytology pending   Drain Location: RUQ Size: Fr size: 10 Fr Date of placement: 01/16/22  Currently to: Drain collection device: gravity 24 hour output:  Output by Drain (mL) 01/19/22 0701 - 01/19/22 1900 01/19/22 1901 - 01/20/22 0700 01/20/22 0701 - 01/20/22 1900 01/20/22 1901 - 01/21/22 0700 01/21/22 0701 - 01/21/22 1215  Closed System Drain Medial RLQ Other (Comment) 75 50 10 15     Interval imaging/drain manipulation:  6/3 CT AP w/o  1. Interval decrease in size of a upper abdominal difficult to measure on this noncontrast study 7 x  5.5 cm gas and fluid collection (from 9.2 x 9.8 cm). Interval placement of a pigtail catheter which likely terminates within the collection. 2. Grossly unremarkable known pancreatic mass is again noted along the proximal pancreas and difficult to measure on this noncontrast study. 3. Common bile duct stent in appropriate position. 4. Persistent trace to small volume ascites within the pelvis. 5. Indeterminate 1.4 cm exophytic right inferior renal pole lesion. Consider nonemergent MRI renal protocol for further evaluation. 6.  Aortic Atherosclerosis (ICD10-I70.0).  Current examination: Flushes/aspirates easily.  Insertion site unremarkable. Suture and stat lock in place. Dressed appropriately.   Plan: Continue TID flushes with 5 cc NS. Record output Q shift. Dressing changes QD or PRN if soiled.  Call IR APP or on call IR MD if difficulty flushing or sudden change in drain output.  Repeat imaging/possible drain injection once output < 10 mL/QD (excluding flush material.)  Discharge planning: Please contact IR APP or on call IR MD prior to patient d/c to ensure appropriate follow up plans are in place. Typically patient will follow up with IR clinic 10-14 days post d/c for repeat imaging/possible drain injection. IR scheduler  will contact patient with date/time of appointment. Patient will need to flush drain QD with 5 cc NS, record output QD, dressing changes every 2-3 days or earlier if soiled.   IR will continue to follow - please call with questions or concerns.   Electronically Signed: Tera Mater, PA-C 01/21/2022, 12:15 PM   I spent a total of 15 Minutes at the the patient's bedside AND on the patient's hospital floor or unit, greater than 50% of which was counseling/coordinating care for hepatic drain.   This chart was dictated using voice recognition software.  Despite best efforts to proofread,  errors can occur which can change the documentation meaning.

## 2022-01-22 ENCOUNTER — Other Ambulatory Visit: Payer: Self-pay | Admitting: Internal Medicine

## 2022-01-22 DIAGNOSIS — K750 Abscess of liver: Secondary | ICD-10-CM | POA: Diagnosis not present

## 2022-01-22 DIAGNOSIS — E871 Hypo-osmolality and hyponatremia: Secondary | ICD-10-CM

## 2022-01-22 DIAGNOSIS — G894 Chronic pain syndrome: Secondary | ICD-10-CM | POA: Diagnosis not present

## 2022-01-22 DIAGNOSIS — I10 Essential (primary) hypertension: Secondary | ICD-10-CM | POA: Diagnosis not present

## 2022-01-22 DIAGNOSIS — K769 Liver disease, unspecified: Secondary | ICD-10-CM | POA: Diagnosis not present

## 2022-01-22 LAB — CBC WITH DIFFERENTIAL/PLATELET
Abs Immature Granulocytes: 0.12 10*3/uL — ABNORMAL HIGH (ref 0.00–0.07)
Basophils Absolute: 0.2 10*3/uL — ABNORMAL HIGH (ref 0.0–0.1)
Basophils Relative: 2 %
Eosinophils Absolute: 0.1 10*3/uL (ref 0.0–0.5)
Eosinophils Relative: 1 %
HCT: 35.8 % — ABNORMAL LOW (ref 36.0–46.0)
Hemoglobin: 11.6 g/dL — ABNORMAL LOW (ref 12.0–15.0)
Immature Granulocytes: 1 %
Lymphocytes Relative: 16 %
Lymphs Abs: 1.8 10*3/uL (ref 0.7–4.0)
MCH: 27.7 pg (ref 26.0–34.0)
MCHC: 32.4 g/dL (ref 30.0–36.0)
MCV: 85.4 fL (ref 80.0–100.0)
Monocytes Absolute: 0.9 10*3/uL (ref 0.1–1.0)
Monocytes Relative: 8 %
Neutro Abs: 7.9 10*3/uL — ABNORMAL HIGH (ref 1.7–7.7)
Neutrophils Relative %: 72 %
Platelets: 374 10*3/uL (ref 150–400)
RBC: 4.19 MIL/uL (ref 3.87–5.11)
RDW: 16.9 % — ABNORMAL HIGH (ref 11.5–15.5)
WBC: 10.9 10*3/uL — ABNORMAL HIGH (ref 4.0–10.5)
nRBC: 0 % (ref 0.0–0.2)

## 2022-01-22 LAB — GLUCOSE, CAPILLARY: Glucose-Capillary: 167 mg/dL — ABNORMAL HIGH (ref 70–99)

## 2022-01-22 LAB — COMPREHENSIVE METABOLIC PANEL
ALT: 46 U/L — ABNORMAL HIGH (ref 0–44)
AST: 76 U/L — ABNORMAL HIGH (ref 15–41)
Albumin: 2.4 g/dL — ABNORMAL LOW (ref 3.5–5.0)
Alkaline Phosphatase: 127 U/L — ABNORMAL HIGH (ref 38–126)
Anion gap: 9 (ref 5–15)
BUN: 5 mg/dL — ABNORMAL LOW (ref 8–23)
CO2: 29 mmol/L (ref 22–32)
Calcium: 8.4 mg/dL — ABNORMAL LOW (ref 8.9–10.3)
Chloride: 90 mmol/L — ABNORMAL LOW (ref 98–111)
Creatinine, Ser: 0.56 mg/dL (ref 0.44–1.00)
GFR, Estimated: >60 mL/min (ref >60)
Glucose, Bld: 193 mg/dL — ABNORMAL HIGH (ref 70–99)
Potassium: 3.9 mmol/L (ref 3.5–5.1)
Sodium: 128 mmol/L — ABNORMAL LOW (ref 135–145)
Total Bilirubin: 0.6 mg/dL (ref 0.3–1.2)
Total Protein: 7.5 g/dL (ref 6.5–8.1)

## 2022-01-22 MED ORDER — HEPARIN SOD (PORK) LOCK FLUSH 100 UNIT/ML IV SOLN
500.0000 [IU] | INTRAVENOUS | Status: AC | PRN
  Administered 2022-01-22: 500 [IU]
  Filled 2022-01-22: qty 5

## 2022-01-22 MED ORDER — AMOXICILLIN-POT CLAVULANATE 875-125 MG PO TABS
1.0000 | ORAL_TABLET | Freq: Two times a day (BID) | ORAL | Status: DC
  Administered 2022-01-22: 1 via ORAL
  Filled 2022-01-22: qty 1

## 2022-01-22 MED ORDER — AMOXICILLIN-POT CLAVULANATE 875-125 MG PO TABS: 1.0000 | ORAL_TABLET | Freq: Two times a day (BID) | ORAL | 1 refills | Status: AC

## 2022-01-22 NOTE — Progress Notes (Signed)
Melanie Alvarez is doing okay.  I am very happy to say that the cytology on the abscess fluid was negative for any malignancy.  Again, I am not sure how she would have developed this abscess.  She is on antibiotics still.  I am not sure she will be able to go home and have the catheter managed at home.  There is been no labs for 4 days.  It would be nice to see what her lab work looks like.  She has had no fever.  She is eating better.  There is no nausea or vomiting.  She has had no bleeding.  She is going to the bathroom.  Her vital signs show temperature 98.2.  Pulse 89.  Blood pressure 127/77.  Her lungs are clear.  Cardiac exam regular rate and rhythm.  Abdomen is soft.  She has a drainage catheter in the right upper quadrant.  There is no guarding or rebound tenderness.  Bowel sounds are decent.  Extremity shows no clubbing, cyanosis or edema.  Hopefully, Melanie Alvarez will be able to go home soon.  Maybe she can go home on oral antibiotics with Augmentin.  I still think would be able to treat her prostate cancer despite this abscess.  We will have to make dosage adjustments so that we do not hopefully cause much in way of neutropenia.  I know that she is getting great care from all the staff up on [address].  Lattie Haw, MD  Psalms 6:2

## 2022-01-22 NOTE — Discharge Summary (Signed)
Physician Discharge Summary  HALIE GASS RCB:[id number] DOB: 12/19/1955 DOA: 01/15/2022  PCP: Flossie Buffy, NP  Admit date: 01/15/2022 Discharge date: 01/22/2022  Admitted From: Home Disposition:  Home  Recommendations for Outpatient Follow-up:  Follow up with IR in 1-2 weeks, will need repeat CT scan to evaluate size of abscess and try to remove pigtail catheter if indicated. Follow-up with infectious disease on 02/21/2022. Please obtain BMP/CBC in one week   Home Health:No Equipment/Devices:No  Discharge Condition:Stable CODE STATUS:Full Diet recommendation: Heart Healthy   Brief/Interim Summary: 66 y.o. 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  CT scan of the abdomen pelvis showed possible abscess IR was consulted to place a drain and 120 mL of purulent material are draining.  Discharge Diagnoses:  Principal Problem:   Hepatic lesion Active Problems:   Hypertension   DM (diabetes mellitus) ([place])   Mixed hyperlipidemia   Hyponatremia   Malignant neoplasm of pancreas (HCC)   Pancreatic insufficiency   Chronic pain syndrome   Portal vein thrombosis   Dyspnea   Constipation   Hepatic abscess  Hepatic abscess: He was started on IV Rocephin and Flagyl. IR was consulted who placed a pigtail catheter. ID was consulted who recommended to IV Unasyn. CT scan of the abdomen was done on 01/19/2022 that showed hepatic abscess greater than 5 cm. ID recommended to continue antibiotics and follow-up as an outpatient with them. She will also follow-up with IR as an outpatient to remove pigtail catheter.  Pancreatic cancer: Follow-up with Dr. Marin Olp  as an outpatient.  Portal vein thrombosis: Eliquis was held for pigtail catheter placement it was resumed 2 days prior to admission showed continued take Eliquis as an outpatient.  Constipation: Resolved.  Dyspnea on exertion: Radiated to abdominal pain. CT scan of the chest showed small bilateral pleural effusion.  Hypovolemic hyponatremia: Resolved with fluid resuscitation.  Orthostatic hypotension: Resolved with fluid resuscitation.  Diabetes mellitus type 2 not on insulin: Metformin was held on admission, she was continuing glipizide and sliding scale. She will resume glipizide and metformin as an outpatient no changes made to her medication.  Hyperlipidemia: Continue statins.  Chronic pancreatic insufficiency: Continue Creon.  Essential hypertension: No changes made to her medication.  Morbid obesity with a BMI greater than 30: Counseled.  Discharge Instructions  Discharge Instructions     Diet - low sodium heart healthy   Complete by: As directed    Increase activity slowly   Complete by: As directed    No wound care   Complete by: As directed       Allergies as of 01/22/2022       Reactions   Lisinopril Swelling, Other (See Comments)   Facial and upper lip        Medication List     TAKE these medications    amLODipine 10 MG tablet Commonly known as: NORVASC Take 1 tablet (10 mg total) by mouth at bedtime.   amoxicillin-clavulanate 875-125 MG tablet Commonly known as: AUGMENTIN Take 1 tablet by mouth every 12 (twelve) hours.   Apixaban Starter Pack ('10mg'$  and '5mg'$ ) Commonly known as: ELIQUIS STARTER PACK Take as directed on package: start with two-'5mg'$  tablets twice daily for 7 days. On day 8,  switch to one-'5mg'$  tablet twice daily. What changed:  how much to take how to take this when to take this additional instructions   atorvastatin 20 MG tablet Commonly known as: LIPITOR Take 1 tablet (20 mg total) by mouth at bedtime.   Creon  36000 UNITS Cpep capsule Generic drug: lipase/protease/amylase TAKE 2 CAPSULES BY MOUTH THREE TIMES DAILY BEFORE MEAL(S) What changed: See the new instructions.   feeding supplement Liqd Take 2 Containers by mouth in the morning.   fentaNYL 25 MCG/HR Commonly known as: [place] 1 patch onto the skin every 3 (three) days.   fluticasone 50 MCG/ACT nasal spray Commonly known as: FLONASE Place 2 sprays into both nostrils daily. What changed:  when to take this reasons to take this   glipiZIDE 5 MG 24 hr tablet Commonly known as: GLUCOTROL XL Take 1 tablet (5 mg total) by mouth daily with breakfast.   glucose blood test strip Test blood glucose once daily. One Touch Verio test strips   HYDROmorphone 4 MG tablet Commonly known as: Dilaudid Take 1 tablet (4 mg total) by mouth every 4 (four) hours as needed for severe pain.   lidocaine-prilocaine cream Commonly known as: EMLA Apply to affected area once What changed:  how much to take when to take this reasons to take this additional instructions   metFORMIN 750 MG 24 hr tablet Commonly known as: Glucophage XR Take 1 tablet (750 mg total) by mouth daily with breakfast.   metoprolol succinate 25 MG 24 hr tablet Commonly known as: TOPROL-XL TAKE 1 TABLET BY MOUTH ONCE DAILY   olmesartan 5 MG tablet Commonly known as: BENICAR Take 1 tablet (5 mg total) by mouth daily.   ondansetron 8 MG tablet Commonly known as: Zofran Take 1 tablet (8 mg total) by mouth 2 (two) times daily as needed (Nausea or vomiting). What changed: reasons to take this   pantoprazole 40 MG tablet Commonly known as: PROTONIX Take 1 tablet (40 mg total) by mouth 2 (two) times daily for 14 days.   pantoprazole 40 MG tablet Commonly known as: Protonix Take 1 tablet (40 mg total) by mouth daily.   potassium chloride SA 20 MEQ tablet Commonly known as: KLOR-CON M Take 1 tablet (20 mEq total) by mouth 2 (two) times daily.    prochlorperazine 10 MG tablet Commonly known as: COMPAZINE Take 1 tablet (10 mg total) by mouth every 6 (six) hours as needed (Nausea or vomiting). What changed: reasons to take this   Tylenol 8 Hour 650 MG CR tablet Generic drug: acetaminophen Take 650-1,300 mg by mouth every 8 (eight) hours as needed for pain.        Allergies  Allergen Reactions   Lisinopril Swelling and Other (See Comments)    Facial and upper lip    Consultations: Oncology Infectious disease Interventional radiology   Procedures/Studies: CT ABDOMEN PELVIS WO CONTRAST  Result Date: 01/19/2022 CLINICAL DATA:  Intra-abdominal abscess follow up abscess. 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 RADIATION DOSE REDUCTION: This exam was performed according to the departmental dose-optimization program which includes automated exposure control, adjustment of the mA and/or kV according to patient size and/or use of iterative reconstruction technique. COMPARISON:  CT abdomen pelvis 01/15/2022 FINDINGS: Lower chest: Bilateral lower lobe  subsegmental atelectasis. Hepatobiliary: No focal liver abnormality. No gallstones, gallbladder wall thickening, or pericholecystic fluid. No biliary dilatation.Redemonstration of a common bile duct stent in stable position. Persistent pneumobilia Pancreas: Grossly unremarkable known pancreatic mass is again noted along the proximal pancreas and difficult to measure on this noncontrast study. Normal pancreatic contour. No surrounding inflammatory changes. No main pancreatic ductal dilatation. Spleen: Normal in size without focal abnormality. Adrenals/Urinary Tract: No adrenal nodule bilaterally. No  nephrolithiasis and no hydronephrosis. Redemonstration of a 1.4 cm exophytic right inferior renal pole lesion with a density of -17 Hounsfield units. No ureterolithiasis or hydroureter. The urinary bladder is unremarkable. Stomach/Bowel: Stomach is within normal limits. No evidence of bowel wall thickening or dilatation. Appendix appears normal. Vascular/Lymphatic: No abdominal aorta or iliac aneurysm. Moderate atherosclerotic plaque of the aorta and its branches. No abdominal, pelvic, or inguinal lymphadenopathy. Reproductive: Uterus and bilateral adnexa are unremarkable. Other: Persistent trace to small volume free fluid within the lower abdomen and pelvis. No intraperitoneal free gas. Interval decrease in size of a difficult to measure on this noncontrast study 7 x 5.5 x 7cm gas and fluid collection (from 9.2 x 9.8 cm). The collection is again noted to abut pancreas and liver. It extends to adjacent to the lesser curvature of the stomach. Interval placement of pigtail catheter with tip likely terminating within the collection. Musculoskeletal: No abdominal wall hernia or abnormality. No suspicious lytic or blastic osseous lesions. No acute displaced fracture. IMPRESSION: 1. Interval decrease in size of a upper abdominal difficult to measure on this noncontrast study 7 x 5.5 cm gas and fluid collection (from 9.2 x 9.8 cm). Interval placement of a pigtail catheter which likely terminates within the collection. 2. Grossly unremarkable known pancreatic mass is again noted along the proximal pancreas and difficult to measure on this noncontrast study. 3. Common bile duct stent in appropriate position. 4. Persistent trace to small volume ascites within the pelvis. 5. Indeterminate 1.4 cm exophytic right inferior renal pole lesion. Consider nonemergent MRI renal protocol for further evaluation. 6.  Aortic Atherosclerosis (ICD10-I70.0). Electronically Signed   By: Iven Finn M.D.   On: 01/19/2022 15:17   CT Angio  Chest PE W and/or Wo Contrast  Result Date: 01/15/2022 CLINICAL DATA:  Shortness of breath with sensation of "feeling funny" over the last couple days. History of pancreatic cancer. Clinical concern for pulmonary embolism. EXAM: CT ANGIOGRAPHY CHEST WITH CONTRAST TECHNIQUE: Multidetector CT imaging of the chest was performed using the standard protocol during bolus administration of intravenous contrast. Multiplanar CT image reconstructions and MIPs were obtained to evaluate the vascular anatomy. RADIATION DOSE REDUCTION: This exam was performed according to the departmental dose-optimization program which includes automated exposure control, adjustment of the mA and/or kV according to patient size and/or use of iterative reconstruction technique. CONTRAST:  4m OMNIPAQUE IOHEXOL 350 MG/ML SOLN COMPARISON:  Chest radiographs 01/15/2022. CTs of the chest, abdomen and pelvis 12/21/2020 and CT of the abdomen and pelvis 12/13/2021. FINDINGS: Cardiovascular: The pulmonary arteries are well opacified with contrast to the level of the subsegmental branches. There is no evidence of acute pulmonary embolism. Right IJ Port-A-Cath extends into the right atrium. There is atherosclerosis of the aorta, great vessels and coronary arteries. The heart is mildly enlarged. No significant pericardial fluid. Mediastinum/Nodes: No enlarged mediastinal, hilar or axillary lymph nodes. Mildly prominent mediastinal lymph nodes are likely reactive. The thyroid gland, trachea and esophagus demonstrate no significant findings. Lungs/Pleura: New small left greater than right pleural effusions. Mild dependent  atelectasis in both lungs. No confluent airspace opacity, suspicious pulmonary nodule or pneumothorax. Upper abdomen: Interval development of a large collection of air and gas within the porta hepatis/gastrohepatic ligament, measuring approximately 10.0 x 7.4 x 7.8 cm. This lies superior to metallic biliary stent. There is increased  pneumobilia. No free intraperitoneal air or other extraluminal fluid collection identified in the upper abdomen. Musculoskeletal/Chest wall: Chronic glenohumeral arthropathy, right greater than left. Mild thoracic spondylosis. No acute osseous findings or chest wall masses. Review of the MIP images confirms the above findings. IMPRESSION: 1. No evidence of acute pulmonary embolism. 2. New small bilateral pleural effusions with mild atelectasis at both lung bases. 3. New large collection of gas and air within the porta hepatis/gastrohepatic ligament compared with abdominal CT of last month. This could reflect an abscess or contained bowel perforation. Given venous thrombosis on prior abdominal CT, this could reflect cavitation of an infarct. Recommend further evaluation with dedicated CT of the abdomen and pelvis to include enteric contrast. 4. These results were called by telephone at the time of interpretation on 01/15/2022 at 1:11 pm to provider ADAM CURATOLO , who verbally acknowledged these results. Electronically Signed   By: Richardean Sale M.D.   On: 01/15/2022 13:12   CT ABDOMEN PELVIS W CONTRAST  Result Date: 01/15/2022 CLINICAL DATA:  Acute generalized abdominal pain. History of pancreatic cancer. EXAM: CT ABDOMEN AND PELVIS WITH CONTRAST TECHNIQUE: Multidetector CT imaging of the abdomen and pelvis was performed using the standard protocol following bolus administration of intravenous contrast. RADIATION DOSE REDUCTION: This exam was performed according to the departmental dose-optimization program which includes automated exposure control, adjustment of the mA and/or kV according to patient size and/or use of iterative reconstruction technique. CONTRAST:  166m OMNIPAQUE IOHEXOL 300 MG/ML  SOLN COMPARISON:  December 13, 2021. FINDINGS: Lower chest: Minimal bilateral posterior basilar subsegmental atelectasis is noted. Hepatobiliary: Hepatic pneumobilia is noted most likely due to common bile duct stent.  There is interval development of 9.8 x 9.2 cm air-fluid collection in the central portion of the liver concerning for hepatic abscess. No gallstones are noted, although pneumobilia has extended into the gallbladder lumen. Pancreas: There is again noted pancreatic head mass that surrounds the common bile duct, and appears to be resulting in occlusion of the superior mesenteric and splenic veins. Severe pancreatic ductal dilatation is noted secondary to this tumor. Spleen: Normal in size without focal abnormality. Adrenals/Urinary Tract: Adrenal glands are unremarkable. No hydronephrosis or renal obstruction is noted. No renal or ureteral calculi are noted. 1.4 cm partially enhancing exophytic mass is seen arising from lower pole of right kidney which was present on prior exam and concerning for possible neoplasm. Bladder is unremarkable. Stomach/Bowel: Stomach is within normal limits. Appendix appears normal. No evidence of bowel wall thickening, distention, or inflammatory changes. Vascular/Lymphatic: Aortic atherosclerosis. Mildly enlarged periaortic adenopathy is noted concerning for metastatic disease. Reproductive: Uterus and bilateral adnexa are unremarkable. Other: No hernia is noted.  Mild ascites is noted in the pelvis. Musculoskeletal: No acute or significant osseous findings. IMPRESSION: Interval development of 9.8 x 9.2 cm air-fluid collection in the central portion of the liver concerning for hepatic abscess. There is again noted pancreatic head mass surrounds the common bile duct, and appears to be resulting in occlusion of the superior mesenteric and splenic veins as noted on prior exam. Severe pancreatic ductal dilatation is noted. Hepatic pneumobilia is noted secondary to common bile duct stent. 1.4 cm partially enhancing exophytic mass is seen arising  from lower pole right kidney concerning for possible neoplasm or malignancy. Mildly enlarged periaortic adenopathy is noted concerning for metastatic  disease. Mild ascites is noted in the pelvis. Aortic Atherosclerosis (ICD10-I70.0). Electronically Signed   By: Marijo Conception M.D.   On: 01/15/2022 16:02   DG CHEST PORT 1 VIEW  Result Date: 01/18/2022 CLINICAL DATA:  Left chest discomfort and dyspnea EXAM: PORTABLE CHEST 1 VIEW COMPARISON:  01/15/2022 chest radiograph. FINDINGS: Right internal jugular Port-A-Cath terminates at the cavoatrial junction. Stable cardiomediastinal silhouette with normal heart size. No pneumothorax. No pleural effusion. No pulmonary edema. Faint hazy left lung base opacity, similar, favor atelectasis. IMPRESSION: Faint hazy left lung base opacity, favor atelectasis. Electronically Signed   By: Ilona Sorrel M.D.   On: 01/18/2022 08:24   DG Chest Portable 1 View  Result Date: 01/15/2022 CLINICAL DATA:  Short of breath EXAM: PORTABLE CHEST 1 VIEW COMPARISON:  09/19/2014 FINDINGS: Heart size mildly enlarged. Negative for heart failure or edema. Mild left lower lobe atelectasis/infiltrate. Right lung clear. No effusion. Port-A-Cath tip in the lower SVC at the cavoatrial junction Degenerative change in both shoulders right greater than left IMPRESSION: Mild left lower lobe airspace disease likely atelectasis Port-A-Cath tip at the cavoatrial junction. Electronically Signed   By: Franchot Gallo M.D.   On: 01/15/2022 11:15   ECHOCARDIOGRAM COMPLETE  Result Date: 01/16/2022    ECHOCARDIOGRAM REPORT   Patient Name:   TAMU GOLZ Date of Exam: 01/16/2022 Medical Rec #:  [id number]         Height:       63.5 in Accession #:    [id number]        Weight:       189.0 lb Date of Birth:  22-Nov-1955         BSA:          1.899 m Patient Age:    66 years          BP:           128/70 mmHg Patient Gender: F                 HR:           100 bpm. Exam Location:  Inpatient Procedure: 2D Echo, Cardiac Doppler, Color Doppler and Intracardiac            Opacification Agent Indications:    I50.40* Unspecified combined systolic (congestive) and  diastolic                 (congestive) heart failure  History:        Patient has no prior history of Echocardiogram examinations.                 Signs/Symptoms:Shortness of Breath, Dyspnea and                 Dizziness/Lightheadedness; Risk Factors:Hypertension, Diabetes                 and Dyslipidemia. Cancer. ETOH.  Sonographer:    Roseanna Rainbow RDCS Referring Phys: [phone] [facility]  Sonographer Comments: Technically difficult study due to poor echo windows. Patient could not tolerate pressure from probe. Recent drain placed in subcostal region. IMPRESSIONS  1. Left ventricular ejection fraction, by estimation, is 40 to 45%. The left ventricle has mildly decreased function. The left ventricle demonstrates global hypokinesis. Left ventricular diastolic parameters are consistent with Grade I diastolic dysfunction (impaired relaxation). Elevated left ventricular end-diastolic pressure.  2. Right ventricular systolic function is normal. The right ventricular size is normal.  3. The mitral valve is normal in structure. Trivial mitral valve regurgitation. No evidence of mitral stenosis.  4. The aortic valve is tricuspid. Aortic valve regurgitation is not visualized. Mild aortic valve stenosis. Aortic valve area, by VTI measures 1.62 cm. Aortic valve mean gradient measures 11.0 mmHg. Aortic valve Vmax measures 2.28 m/s.  5. The inferior vena cava is normal in size with greater than 50% respiratory variability, suggesting right atrial pressure of 3 mmHg. FINDINGS  Left Ventricle: Left ventricular ejection fraction, by estimation, is 40 to 45%. The left ventricle has mildly decreased function. The left ventricle demonstrates global hypokinesis. The left ventricular internal cavity size was normal in size. There is  no left ventricular hypertrophy. Left ventricular diastolic parameters are consistent with Grade I diastolic dysfunction (impaired relaxation). Elevated left ventricular end-diastolic pressure. Right  Ventricle: The right ventricular size is normal. No increase in right ventricular wall thickness. Right ventricular systolic function is normal. Left Atrium: Left atrial size was normal in size. Right Atrium: Right atrial size was normal in size. Pericardium: There is no evidence of pericardial effusion. Mitral Valve: The mitral valve is normal in structure. Trivial mitral valve regurgitation. No evidence of mitral valve stenosis. MV peak gradient, 11.8 mmHg. The mean mitral valve gradient is 5.0 mmHg. Tricuspid Valve: The tricuspid valve is normal in structure. Tricuspid valve regurgitation is trivial. No evidence of tricuspid stenosis. Aortic Valve: The aortic valve is tricuspid. Aortic valve regurgitation is not visualized. Mild aortic stenosis is present. Aortic valve mean gradient measures 11.0 mmHg. Aortic valve peak gradient measures 20.7 mmHg. Aortic valve area, by VTI measures 1.62 cm. Pulmonic Valve: The pulmonic valve was normal in structure. Pulmonic valve regurgitation is not visualized. No evidence of pulmonic stenosis. Aorta: The aortic root is normal in size and structure. Venous: The inferior vena cava was not well visualized. The inferior vena cava is normal in size with greater than 50% respiratory variability, suggesting right atrial pressure of 3 mmHg. IAS/Shunts: No atrial level shunt detected by color flow Doppler.  LEFT VENTRICLE PLAX 2D LVIDd:         4.50 cm     Diastology LVIDs:         3.20 cm     LV e' medial:    4.57 cm/s LV PW:         1.00 cm     LV E/e' medial:  18.5 LV IVS:        1.00 cm     LV e' lateral:   5.00 cm/s LVOT diam:     1.70 cm     LV E/e' lateral: 16.9 LV SV:         54 LV SV Index:   28 LVOT Area:     2.27 cm  LV Volumes (MOD) LV vol d, MOD A2C: 90.9 ml LV vol d, MOD A4C: 80.6 ml LV vol s, MOD A2C: 44.1 ml LV vol s, MOD A4C: 51.8 ml LV SV MOD A2C:     46.8 ml LV SV MOD A4C:     80.6 ml LV SV MOD BP:      37.5 ml RIGHT VENTRICLE RV S prime:     16.20 cm/s TAPSE  (M-mode): 2.0 cm LEFT ATRIUM             Index        RIGHT ATRIUM  Index LA diam:        3.40 cm 1.79 cm/m   RA Area:     9.83 cm LA Vol (A2C):   29.0 ml 15.27 ml/m  RA Volume:   18.20 ml 9.58 ml/m LA Vol (A4C):   33.1 ml 17.43 ml/m LA Biplane Vol: 31.7 ml 16.69 ml/m  AORTIC VALVE AV Area (Vmax):    1.63 cm AV Area (Vmean):   1.59 cm AV Area (VTI):     1.62 cm AV Vmax:           227.67 cm/s AV Vmean:          151.667 cm/s AV VTI:            0.330 m AV Peak Grad:      20.7 mmHg AV Mean Grad:      11.0 mmHg LVOT Vmax:         163.00 cm/s LVOT Vmean:        106.000 cm/s LVOT VTI:          0.236 m LVOT/AV VTI ratio: 0.72  AORTA Ao Root diam: 2.60 cm Ao Asc diam:  3.00 cm MITRAL VALVE                TRICUSPID VALVE MV Area (PHT): 5.52 cm     TR Peak grad:   10.0 mmHg MV Area VTI:   1.65 cm     TR Vmax:        158.00 cm/s MV Peak grad:  11.8 mmHg MV Mean grad:  5.0 mmHg     SHUNTS MV Vmax:       1.72 m/s     Systemic VTI:  0.24 m MV Vmean:      96.8 cm/s    Systemic Diam: 1.70 cm MV Decel Time: 137 msec MV E velocity: 84.33 cm/s MV A velocity: 156.33 cm/s MV E/A ratio:  0.54 Skeet Latch MD Electronically signed by Skeet Latch MD Signature Date/Time: 01/16/2022/4:14:37 PM    Final    CT IMAGE GUIDED DRAINAGE BY PERCUTANEOUS CATHETER  Result Date: 01/17/2022 CLINICAL DATA:  Pancreatic carcinoma status post biliary stenting. 9.8 cm gas and fluid collection in the porta hepatis EXAM: CT GUIDED DRAINAGE OF ABDOMINAL ABSCESS ANESTHESIA/SEDATION: Intravenous Fentanyl 180mg and Versed '2mg'$  were administered as conscious sedation during continuous monitoring of the patient's level of consciousness and physiological / cardiorespiratory status by the radiology RN, with a total moderate sedation time of 20 minutes. PROCEDURE: The procedure, risks, benefits, and alternatives were explained to the patient. Questions regarding the procedure were encouraged and answered. the patient understands and  consents to the procedure. select axial scans through the abdomen were obtained. the collection was localized an appropriate skin entry site was determined and marked. The operative field was prepped with chlorhexidinein a sterile fashion, and a sterile drape was applied covering the operative field. A sterile gown and sterile gloves were used for the procedure. Local anesthesia was provided with 1% Lidocaine. Under CT fluoroscopic guidance, 18 gauge needle was advanced into the collection. Amplatz guidewire advanced easily. CT confirmed appropriate positioning. Tract dilated with 10 French dilator to facilitate placement of a 10 French pigtail drain catheter, formed centrally within the dependent aspect of the collection. 10 mL of thin purulent material were aspirated, sent for Gram stain and culture. Catheter secured externally 0 Prolene suture and StatLock and placed to gravity drain bag. The patient tolerated the procedure well. COMPLICATIONS: None immediate FINDINGS: Large gas and fluid  collection in the porta hepatis was localized. 10 French drain catheter placed as above. 10 mL of purulent aspirate sent for Gram stain and culture. IMPRESSION: 1. Technically successful subhepatic abscess drain catheter placement with CT guidance. RADIATION DOSE REDUCTION: This exam was performed according to the departmental dose-optimization program which includes automated exposure control, adjustment of the mA and/or kV according to patient size and/or use of iterative reconstruction technique. Electronically Signed   By: Lucrezia Europe M.D.   On: 01/17/2022 07:37   IR IMAGING GUIDED PORT INSERTION  Result Date: 12/31/2021 INDICATION: Pancreatic malignancy EXAM: IMPLANTED PORT A CATH PLACEMENT WITH ULTRASOUND AND FLUOROSCOPIC GUIDANCE MEDICATIONS: None ANESTHESIA/SEDATION: Moderate (conscious) sedation was employed during this procedure. A total of Versed 2 mg and Fentanyl 100 mcg was administered intravenously by the  radiology nurse. Total intra-service moderate Sedation Time: 19 minutes. The patient's level of consciousness and vital signs were monitored continuously by radiology nursing throughout the procedure under my direct supervision. FLUOROSCOPY: Radiation Exposure Index (as provided by the fluoroscopic device): 4 mGy Kerma COMPLICATIONS: None immediate. PROCEDURE: The procedure, risks, benefits, and alternatives were explained to the patient. Questions regarding the procedure were encouraged and answered. The patient understands and consents to the procedure. A timeout was performed prior to the initiation of the procedure. Patient positioned supine on the angiography table. Right neck and anterior upper chest prepped and draped in the usual sterile fashion. All elements of maximal sterile barrier were utilized including, cap, mask, sterile gown, sterile gloves, large sterile drape, hand scrubbing and 2% Chlorhexidine for skin cleaning. The right internal jugular vein was evaluated with ultrasound and shown to be patent. A permanent ultrasound image was obtained and placed in the patient's medical record. Local anesthesia was provided with 1% lidocaine with epinephrine. Using sterile gel and a sterile probe cover, the right internal jugular vein was entered with a 21 ga needle during real time ultrasound guidance. 0.018 inch guidewire placed and 21 ga needle exchanged for transitional dilator set. Utilizing fluoroscopy, 0.035 inch guidewire advanced through the needle without difficulty. Attention then turned to the right anterior upper chest. Following local lidocaine administration, a port pocket was created. The catheter was connected to the port and brought from the pocket to the venotomy site through a subcutaneous tunnel. The catheter was cut to size and inserted through the peel-away sheath. The catheter tip was positioned at the cavoatrial junction using fluoroscopic guidance. The port aspirated and flushed  well. The port pocket was closed with deep and superficial absorbable suture. The port pocket incision and venotomy sites were also sealed with Dermabond. IMPRESSION: Successful placement of a right internal jugular approach power injectable Port-A-Cath. The catheter is ready for immediate use. Electronically Signed   By: Miachel Roux M.D.   On: 12/31/2021 16:19   (Echo, Carotid, EGD, Colonoscopy, ERCP)    Subjective: No complaints  Discharge Exam: Vitals:   01/21/22 2134 01/22/22 0631  BP: (!) 143/81 127/77  Pulse: 87 89  Resp: 17 17  Temp: 97.8 F (36.6 C) 98.2 F (36.8 C)  SpO2: 94% 93%   Vitals:   01/21/22 0519 01/21/22 1358 01/21/22 2134 01/22/22 0631  BP: 127/82 119/71 (!) 143/81 127/77  Pulse: 73 86 87 89  Resp: '18 16 17 17  '$ Temp: 97.8 F (36.6 C) 98.4 F (36.9 C) 97.8 F (36.6 C) 98.2 F (36.8 C)  TempSrc: Oral Oral Oral Oral  SpO2: 93% 93% 94% 93%  Weight:      Height:  General: Pt is alert, awake, not in acute distress Cardiovascular: RRR, S1/S2 +, no rubs, no gallops Respiratory: CTA bilaterally, no wheezing, no rhonchi Abdominal: Soft, NT, ND, bowel sounds + Extremities: no edema, no cyanosis    The results of significant diagnostics from this hospitalization (including imaging, microbiology, ancillary and laboratory) are listed below for reference.     Microbiology: Recent Results (from the past 240 hour(s))  Culture, blood (Routine X 2) w Reflex to ID Panel     Status: None   Collection Time: 01/16/22  8:45 AM   Specimen: BLOOD  Result Value Ref Range Status   Specimen Description   Final    BLOOD LEFT ANTECUBITAL Performed at [facility] [address] [place], [place] [address]    Special Requests   Final    IN PEDIATRIC BOTTLE Blood Culture adequate volume Performed at [facility] [address] [place], [place] [address]    Culture   Final    NO GROWTH 5 DAYS Performed at [facility], [place] [address] [place], [place] [address]    Report Status 01/21/2022 FINAL  Final  Culture, blood (Routine X 2) w Reflex to ID Panel     Status: None   Collection Time: 01/16/22  8:49 AM   Specimen: BLOOD  Result Value Ref Range Status   Specimen Description   Final    BLOOD RIGHT ANTECUBITAL Performed at [facility] [address] [place], [place] [address]    Special Requests   Final    IN PEDIATRIC BOTTLE Blood Culture adequate volume Performed at [facility] [address], [place], [place] [address]    Culture   Final    NO GROWTH 5 DAYS Performed at [facility], [place] [address], [place], [place] [address]    Report Status 01/21/2022 FINAL  Final  Aerobic/Anaerobic Culture w Gram Stain (surgical/deep wound)     Status: None   Collection Time: 01/16/22 11:50 AM   Specimen: Abscess  Result Value Ref Range Status   Specimen Description   Final    ABSCESS ABDOMINAL Performed at [facility] [address], [place], [place] [address]    Special Requests   Final    NONE Performed at [facility], [place] [address] [place], [place] [address]    Gram Stain   Final    NO WBC SEEN ABUNDANT GRAM POSITIVE COCCI ABUNDANT GRAM POSITIVE RODS ABUNDANT GRAM NEGATIVE RODS    Culture   Final    MODERATE ESCHERICHIA COLI MODERATE STREPTOCOCCUS CONSTELLATUS MODERATE BACTEROIDES INTERMEDIUS BETA LACTAMASE POSITIVE Performed at [facility], [place] [address] [place], [place] [address]    Report Status 01/20/2022 FINAL  Final   Organism ID, Bacteria ESCHERICHIA COLI  Final   Organism ID, Bacteria STREPTOCOCCUS CONSTELLATUS  Final      Susceptibility   Streptococcus constellatus - MIC*    PENICILLIN <=0.06 SENSITIVE Sensitive     CEFTRIAXONE <=0.12 SENSITIVE Sensitive     ERYTHROMYCIN <=0.12 SENSITIVE Sensitive     LEVOFLOXACIN <=0.25 SENSITIVE Sensitive     VANCOMYCIN 0.25  SENSITIVE Sensitive     * MODERATE STREPTOCOCCUS CONSTELLATUS   Escherichia coli - MIC*    AMPICILLIN <=2 SENSITIVE Sensitive     CEFAZOLIN <=4 SENSITIVE Sensitive     CEFEPIME <=0.12 SENSITIVE Sensitive     CEFTAZIDIME <=1 SENSITIVE Sensitive     CEFTRIAXONE <=0.25 SENSITIVE  Sensitive     CIPROFLOXACIN <=0.25 SENSITIVE Sensitive     GENTAMICIN <=1 SENSITIVE Sensitive     IMIPENEM <=0.25 SENSITIVE Sensitive     TRIMETH/SULFA <=20 SENSITIVE Sensitive     AMPICILLIN/SULBACTAM <=2 SENSITIVE Sensitive     PIP/TAZO <=4 SENSITIVE Sensitive     * MODERATE ESCHERICHIA COLI     Labs: BNP (last 3 results) Recent Labs    01/15/22 1116  BNP 503.5*   Basic Metabolic Panel: Recent Labs  Lab 01/15/22 1116 01/16/22 0500 01/17/22 1234 01/18/22 0621  NA 128* 131* 132* 134*  K 4.1 3.9 3.1* 3.6  CL 92* 97* 100 99  CO2 '26 26 24 26  '$ GLUCOSE 283* 100* 157* 163*  BUN 13 9 7* 5*  CREATININE 0.67 0.47 0.61 0.48  CALCIUM 8.1* 8.0* 7.5* 8.1*   Liver Function Tests: Recent Labs  Lab 01/15/22 1116 01/16/22 0500 01/18/22 0621  AST 41 39 32  ALT 36 32 28  ALKPHOS 105 103 95  BILITOT 0.8 0.7 0.7  PROT 6.9 6.5 6.7  ALBUMIN 2.2* 2.0* 2.1*   Recent Labs  Lab 01/15/22 1116  LIPASE 23   No results for input(s): AMMONIA in the last 168 hours. CBC: Recent Labs  Lab 01/15/22 1116 01/16/22 0500 01/18/22 0621  WBC 9.1 9.9 9.5  NEUTROABS 6.5  --  6.9  HGB 11.8* 11.0* 11.1*  HCT 36.5 34.5* 33.7*  MCV 85.1 86.0 84.9  PLT 538* 422* 455*   Cardiac Enzymes: No results for input(s): CKTOTAL, CKMB, CKMBINDEX, TROPONINI in the last 168 hours. BNP: Invalid input(s): POCBNP CBG: Recent Labs  Lab 01/20/22 2208 01/21/22 0755 01/21/22 1210 01/21/22 1700 01/21/22 2130  GLUCAP 92 194* 167* 134* 163*   D-Dimer No results for input(s): DDIMER in the last 72 hours. Hgb A1c No results for input(s): HGBA1C in the last 72 hours. Lipid Profile No results for input(s): CHOL, HDL, LDLCALC,  TRIG, CHOLHDL, LDLDIRECT in the last 72 hours. Thyroid function studies No results for input(s): TSH, T4TOTAL, T3FREE, THYROIDAB in the last 72 hours.  Invalid input(s): FREET3 Anemia work up No results for input(s): VITAMINB12, FOLATE, FERRITIN, TIBC, IRON, RETICCTPCT in the last 72 hours. Urinalysis    Component Value Date/Time   COLORURINE YELLOW 11/27/2021 0903   APPEARANCEUR CLEAR 11/27/2021 0903   LABSPEC 1.019 11/27/2021 0903   PHURINE 6.0 11/27/2021 0903   GLUCOSEU 1+ (A) 11/27/2021 0903   HGBUR NEGATIVE 11/27/2021 0903   BILIRUBINUR SMALL (A) 09/20/2019 1951   BILIRUBINUR NEGATIVE 10/22/2018 1556   KETONESUR NEGATIVE 11/27/2021 0903   PROTEINUR 1+ (A) 11/27/2021 0903   UROBILINOGEN 0.2 10/22/2018 1556   NITRITE NEGATIVE 09/20/2019 1951   LEUKOCYTESUR NEGATIVE 09/20/2019 1951   Sepsis Labs Invalid input(s): PROCALCITONIN,  WBC,  LACTICIDVEN Microbiology Recent Results (from the past 240 hour(s))  Culture, blood (Routine X 2) w Reflex to ID Panel     Status: None   Collection Time: 01/16/22  8:45 AM   Specimen: BLOOD  Result Value Ref Range Status   Specimen Description   Final    BLOOD LEFT ANTECUBITAL Performed at [facility], [place] [address], [place], [place] [address]    Special Requests   Final    IN PEDIATRIC BOTTLE Blood Culture adequate volume Performed at [facility] [address] [place], [place] [address]    Culture   Final    NO GROWTH 5 DAYS Performed at [facility], [place],  [place] [address]    Report Status 01/21/2022 FINAL  Final  Culture, blood (Routine X 2) w Reflex to ID Panel     Status: None   Collection Time: 01/16/22  8:49 AM   Specimen: BLOOD  Result Value Ref Range Status   Specimen Description   Final    BLOOD RIGHT ANTECUBITAL Performed at [facility] [address] [place], [place] [address]    Special Requests   Final    IN PEDIATRIC BOTTLE  Blood Culture adequate volume Performed at [facility] [address], [place], [place] [address]    Culture   Final    NO GROWTH 5 DAYS Performed at [facility], [place] [address] [place], [place] [address]    Report Status 01/21/2022 FINAL  Final  Aerobic/Anaerobic Culture w Gram Stain (surgical/deep wound)     Status: None   Collection Time: 01/16/22 11:50 AM   Specimen: Abscess  Result Value Ref Range Status   Specimen Description   Final    ABSCESS ABDOMINAL Performed at [facility] [address], [place], [place] [address]    Special Requests   Final    NONE Performed at [facility], [place] [address], [place], [place] [address]    Gram Stain   Final    NO WBC SEEN ABUNDANT GRAM POSITIVE COCCI ABUNDANT GRAM POSITIVE RODS ABUNDANT GRAM NEGATIVE RODS    Culture   Final    MODERATE ESCHERICHIA COLI MODERATE STREPTOCOCCUS CONSTELLATUS MODERATE BACTEROIDES INTERMEDIUS BETA LACTAMASE POSITIVE Performed at [facility], [place] [address], [place], [place] [address]    Report Status 01/20/2022 FINAL  Final   Organism ID, Bacteria ESCHERICHIA COLI  Final   Organism ID, Bacteria STREPTOCOCCUS CONSTELLATUS  Final      Susceptibility   Streptococcus constellatus - MIC*    PENICILLIN <=0.06 SENSITIVE Sensitive     CEFTRIAXONE <=0.12 SENSITIVE Sensitive     ERYTHROMYCIN <=0.12 SENSITIVE Sensitive     LEVOFLOXACIN <=0.25 SENSITIVE Sensitive     VANCOMYCIN 0.25 SENSITIVE Sensitive     * MODERATE STREPTOCOCCUS CONSTELLATUS   Escherichia coli - MIC*    AMPICILLIN <=2 SENSITIVE Sensitive     CEFAZOLIN <=4 SENSITIVE Sensitive     CEFEPIME <=0.12 SENSITIVE Sensitive     CEFTAZIDIME <=1 SENSITIVE Sensitive     CEFTRIAXONE <=0.25 SENSITIVE Sensitive     CIPROFLOXACIN <=0.25 SENSITIVE Sensitive     GENTAMICIN <=1 SENSITIVE Sensitive     IMIPENEM <=0.25 SENSITIVE Sensitive     TRIMETH/SULFA <=20 SENSITIVE Sensitive      AMPICILLIN/SULBACTAM <=2 SENSITIVE Sensitive     PIP/TAZO <=4 SENSITIVE Sensitive     * MODERATE ESCHERICHIA COLI     SIGNED:   Charlynne Cousins, MD  [facility] 01/22/2022, 7:18 AM Pager   If 7PM-7AM, please contact night-coverage [email or web address] Password [email or web address]

## 2022-01-23 ENCOUNTER — Telehealth: Payer: Self-pay

## 2022-01-23 ENCOUNTER — Inpatient Hospital Stay: Payer: BC Managed Care – PPO

## 2022-01-23 ENCOUNTER — Inpatient Hospital Stay: Payer: BC Managed Care – PPO | Admitting: Hematology & Oncology

## 2022-01-23 NOTE — Telephone Encounter (Signed)
Transition Care Management Unsuccessful Follow-up Telephone Call  Date of discharge and from where:  [facility] 01/22/2022  Attempts:  1st Attempt  Reason for unsuccessful TCM follow-up call:  No answer/busy

## 2022-01-25 ENCOUNTER — Encounter (HOSPITAL_COMMUNITY): Payer: Self-pay | Admitting: Emergency Medicine

## 2022-01-25 ENCOUNTER — Emergency Department (HOSPITAL_COMMUNITY)
Admission: EM | Admit: 2022-01-25 | Discharge: 2022-01-25 | Disposition: A | Discharge status: Home / Self Care | Payer: BC Managed Care – PPO | Attending: Emergency Medicine | Admitting: Emergency Medicine

## 2022-01-25 ENCOUNTER — Other Ambulatory Visit: Payer: Self-pay

## 2022-01-25 ENCOUNTER — Emergency Department (HOSPITAL_COMMUNITY): Payer: BC Managed Care – PPO

## 2022-01-25 DIAGNOSIS — R1084 Generalized abdominal pain: Secondary | ICD-10-CM | POA: Insufficient documentation

## 2022-01-25 DIAGNOSIS — Z8507 Personal history of malignant neoplasm of pancreas: Secondary | ICD-10-CM | POA: Diagnosis not present

## 2022-01-25 DIAGNOSIS — R0602 Shortness of breath: Secondary | ICD-10-CM | POA: Diagnosis not present

## 2022-01-25 DIAGNOSIS — R109 Unspecified abdominal pain: Secondary | ICD-10-CM

## 2022-01-25 DIAGNOSIS — R630 Anorexia: Secondary | ICD-10-CM | POA: Insufficient documentation

## 2022-01-25 DIAGNOSIS — Z7901 Long term (current) use of anticoagulants: Secondary | ICD-10-CM | POA: Insufficient documentation

## 2022-01-25 DIAGNOSIS — R531 Weakness: Secondary | ICD-10-CM | POA: Insufficient documentation

## 2022-01-25 LAB — CBC WITH DIFFERENTIAL/PLATELET
Abs Immature Granulocytes: 0.06 10*3/uL (ref 0.00–0.07)
Basophils Absolute: 0.2 10*3/uL — ABNORMAL HIGH (ref 0.0–0.1)
Basophils Relative: 2 %
Eosinophils Absolute: 0 10*3/uL (ref 0.0–0.5)
Eosinophils Relative: 0 %
HCT: 33.7 % — ABNORMAL LOW (ref 36.0–46.0)
Hemoglobin: 11 g/dL — ABNORMAL LOW (ref 12.0–15.0)
Immature Granulocytes: 1 %
Lymphocytes Relative: 14 %
Lymphs Abs: 1.4 10*3/uL (ref 0.7–4.0)
MCH: 27.8 pg (ref 26.0–34.0)
MCHC: 32.6 g/dL (ref 30.0–36.0)
MCV: 85.1 fL (ref 80.0–100.0)
Monocytes Absolute: 0.8 10*3/uL (ref 0.1–1.0)
Monocytes Relative: 8 %
Neutro Abs: 8 10*3/uL — ABNORMAL HIGH (ref 1.7–7.7)
Neutrophils Relative %: 75 %
Platelets: 369 10*3/uL (ref 150–400)
RBC: 3.96 MIL/uL (ref 3.87–5.11)
RDW: 17.3 % — ABNORMAL HIGH (ref 11.5–15.5)
WBC: 10.6 10*3/uL — ABNORMAL HIGH (ref 4.0–10.5)
nRBC: 0 % (ref 0.0–0.2)

## 2022-01-25 LAB — COMPREHENSIVE METABOLIC PANEL
ALT: 32 U/L (ref 0–44)
AST: 38 U/L (ref 15–41)
Albumin: 2.6 g/dL — ABNORMAL LOW (ref 3.5–5.0)
Alkaline Phosphatase: 98 U/L (ref 38–126)
Anion gap: 10 (ref 5–15)
BUN: 6 mg/dL — ABNORMAL LOW (ref 8–23)
CO2: 26 mmol/L (ref 22–32)
Calcium: 8.8 mg/dL — ABNORMAL LOW (ref 8.9–10.3)
Chloride: 94 mmol/L — ABNORMAL LOW (ref 98–111)
Creatinine, Ser: 0.57 mg/dL (ref 0.44–1.00)
GFR, Estimated: >60 mL/min (ref >60)
Glucose, Bld: 137 mg/dL — ABNORMAL HIGH (ref 70–99)
Potassium: 3.4 mmol/L — ABNORMAL LOW (ref 3.5–5.1)
Sodium: 130 mmol/L — ABNORMAL LOW (ref 135–145)
Total Bilirubin: 0.8 mg/dL (ref 0.3–1.2)
Total Protein: 7.8 g/dL (ref 6.5–8.1)

## 2022-01-25 LAB — BRAIN NATRIURETIC PEPTIDE: B Natriuretic Peptide: 61.2 pg/mL (ref 0.0–100.0)

## 2022-01-25 LAB — TROPONIN I (HIGH SENSITIVITY)
Troponin I (High Sensitivity): 4 ng/L (ref <18)
Troponin I (High Sensitivity): 4 ng/L (ref <18)

## 2022-01-25 LAB — LIPASE, BLOOD: Lipase: 24 U/L (ref 11–51)

## 2022-01-25 IMAGING — CT CT ABD-PELV W/ CM
2 of 5 series · 15 of 46 positions shown, 17 images · IV contrast (OMNIPAQUE 350)
Comparison: None Available.

CLINICAL DATA: Short of breath. History of pancreatic cancer with
recent liver resection. * Tracking Code: BO *



[Series 2: axial st · axial · 0.69mm/px · z∈[+1136,+1501]mm · 12 of 85 slices shown, 14 images]
[im 6/85  soft-tissue]
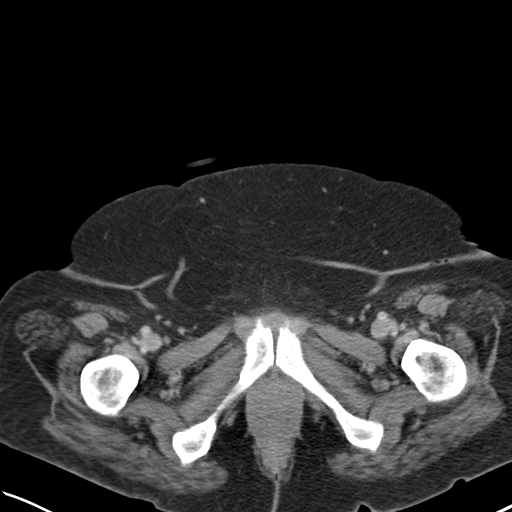
[im 6/85  bone]
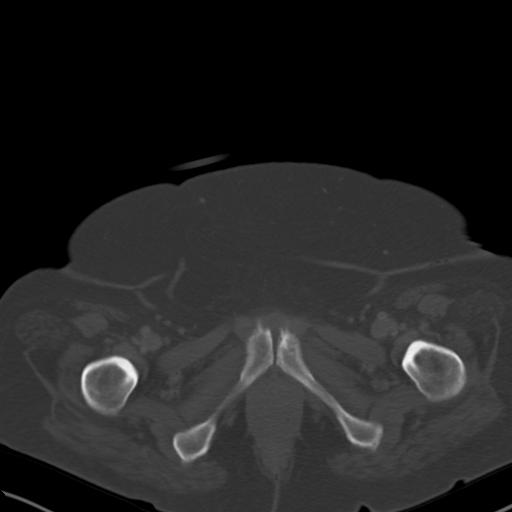
[im 12/85  soft-tissue]
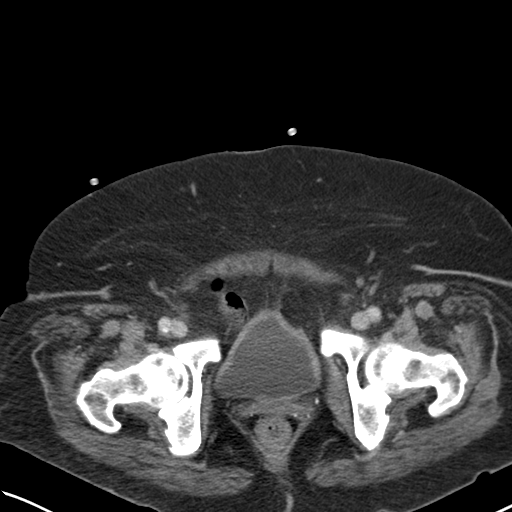
[im 17/85  soft-tissue]
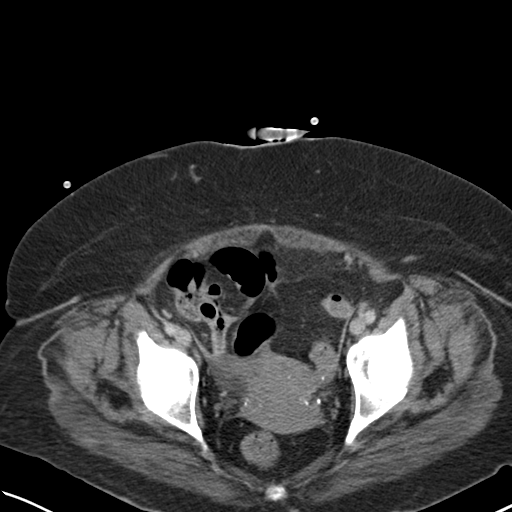
[im 29/85  soft-tissue]
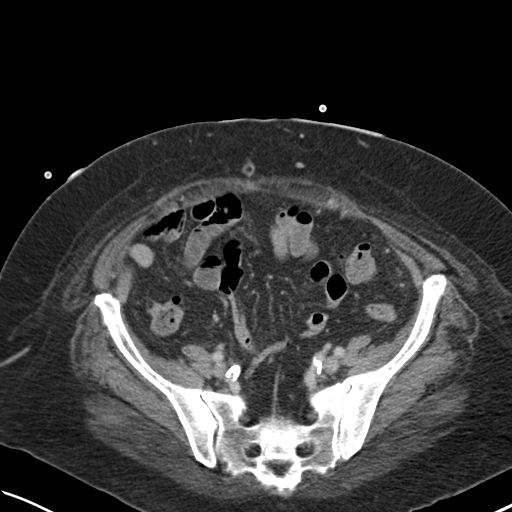
[im 34/85  soft-tissue]
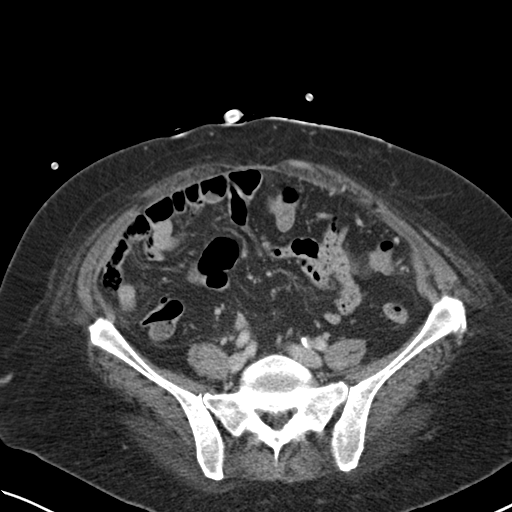
[im 40/85  soft-tissue]
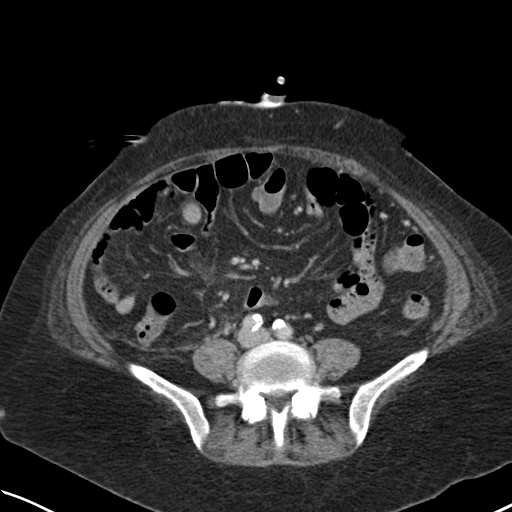
[im 45/85  soft-tissue]
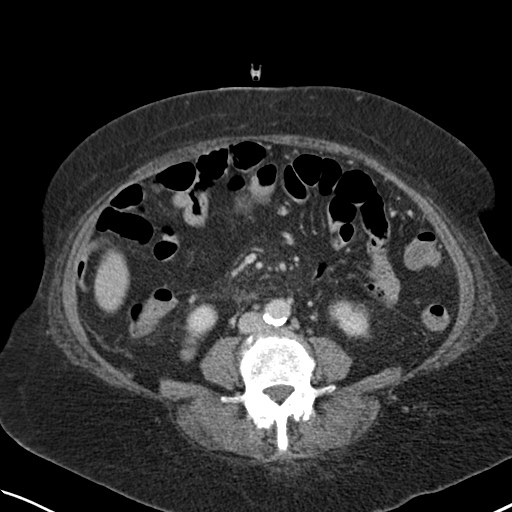
[im 51/85  soft-tissue]
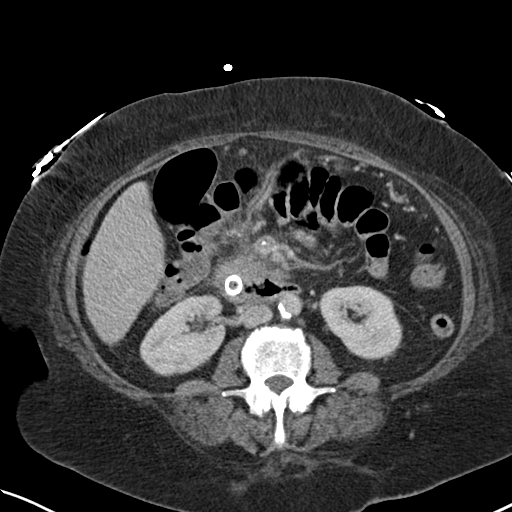
[im 57/85  soft-tissue]
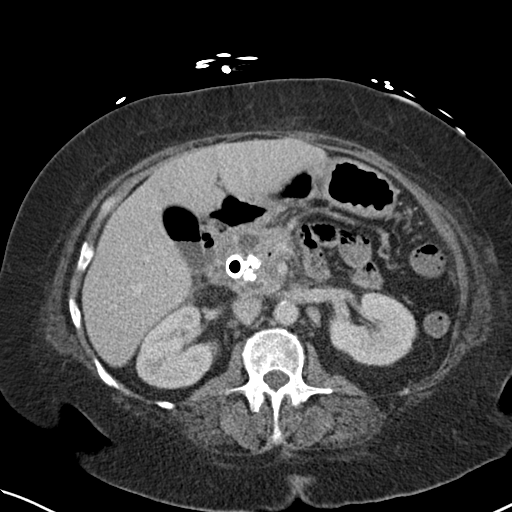
[im 57/85  bone]
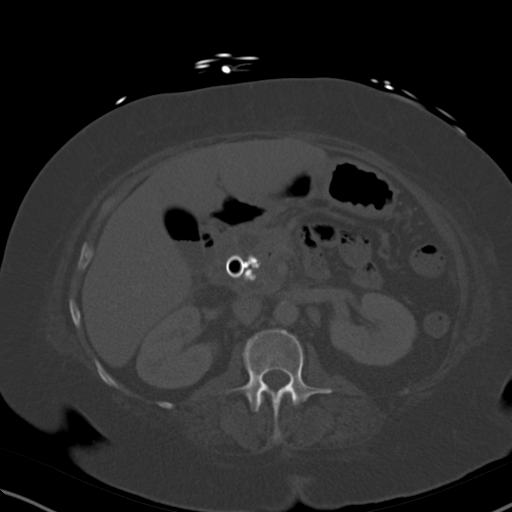
[im 68/85  soft-tissue]
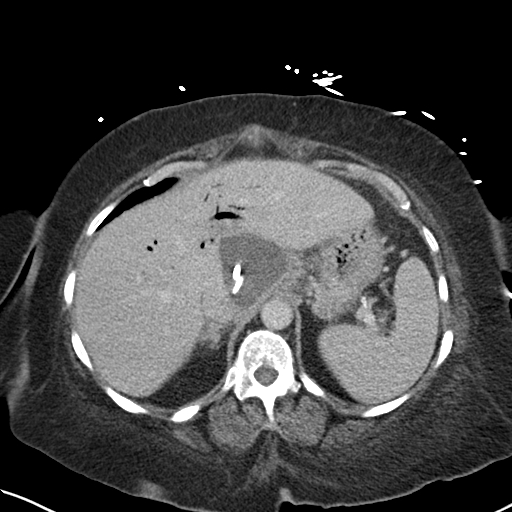
[im 73/85  soft-tissue]
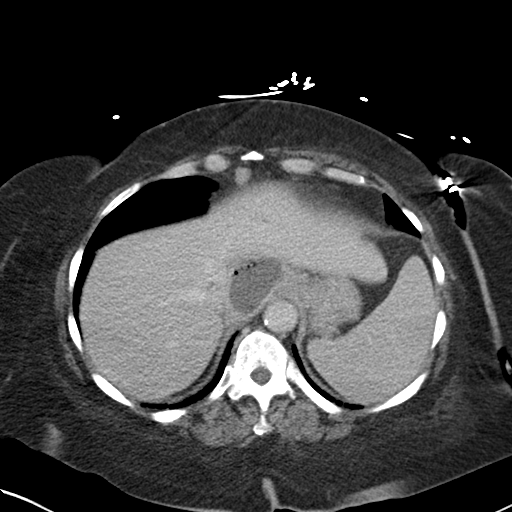
[im 79/85  soft-tissue]
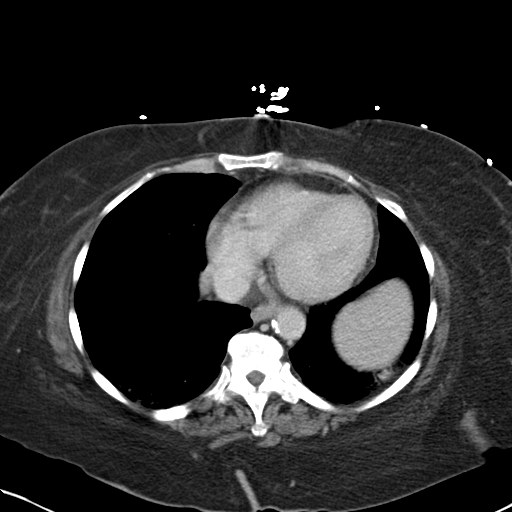

[Series 5: coronal st · coronal · 0.82mm/px · 3 of 91 slices shown]
[im 31/91  soft-tissue]
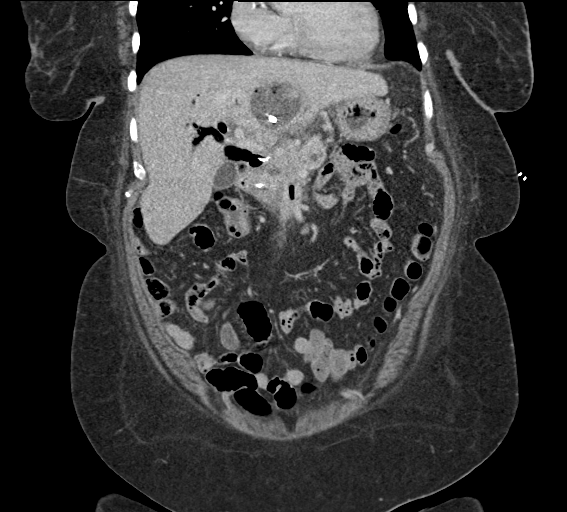
[im 41/91  soft-tissue]
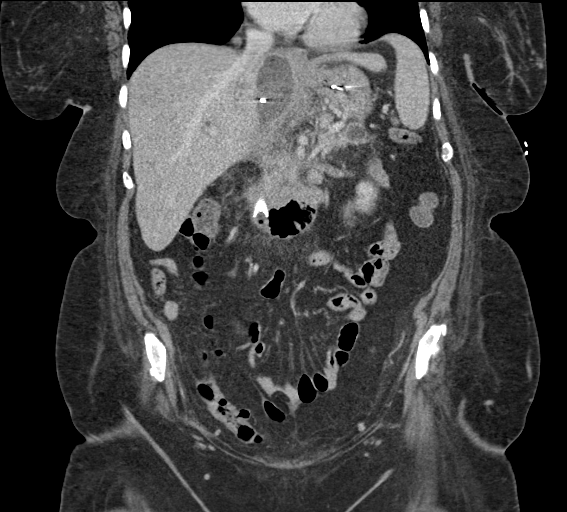
[im 51/91  soft-tissue]
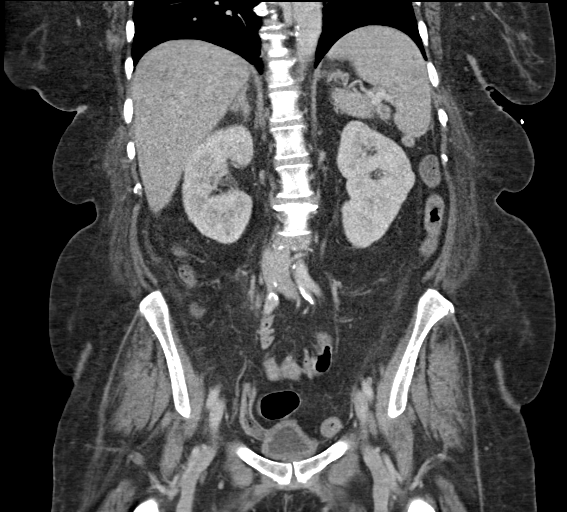

[15 of 46 positions shown; findings below may reference images not displayed]

RADIATION DOSE REDUCTION: This exam was performed according to the
departmental dose-optimization program which includes automated
exposure control, adjustment of the mA and/or kV according to
patient size and/or use of iterative reconstruction technique.

CONTRAST:  100mL OMNIPAQUE IOHEXOL 350 MG/ML SOLN
FINDINGS: CTA CHEST FINDINGS

Cardiovascular: No filling defects within the pulmonary arteries to
suggest acute pulmonary embolism.

Mediastinum/Nodes: No axillary or supraclavicular adenopathy. No
mediastinal or hilar adenopathy. No pericardial fluid. Esophagus
normal.

Lungs/Pleura: No airspace disease. No pneumothorax. No pleural
fluid. Sub pleural loculated fluid in the LEFT lower lobe measures
3.4 x 1.5 cm not changed from 3.7 by 1.5 cm.

Musculoskeletal:

Review of the MIP images confirms the above findings.

CT ABDOMEN and PELVIS FINDINGS

Hepatobiliary: Again demonstrated fluid collection in the porta
hepatis measuring approximately 4.6 x 4.5 cm (image 17/series 2)
compared to 5.6 x 4.6 cm on prior remeasured. There is a
percutaneous drainage catheter with pigtail coiled within the fluid
collection.

There is extensive pneumobilia within the liver unchanged. Common
bile duct stent in place. There is dilatation of the pancreatic duct
to 8 mm. Findings similar to comparison exams.

No new fluid collections associated with the liver or pancreas.

Pancreas: Common bile duct stent. Multiple calcifications in the
head of the pancreas. pancreatic duct dilatation 8 mm similar prior.
Pancreatic atrophy is also noted.

Spleen: Normal spleen

Adrenals/urinary tract: Adrenal glands and kidneys are normal. The
ureters and bladder normal.

Stomach/Bowel: Stomach, small bowel, appendix, and cecum are normal.
The colon and rectosigmoid colon are normal.

Vascular/Lymphatic: Abdominal aorta is normal caliber with
atherosclerotic calcification. There is no retroperitoneal or
periportal lymphadenopathy. No pelvic lymphadenopathy.

Reproductive: Unremarkable

Other: No peritoneal metastasis.  No free fluid.

Musculoskeletal: No aggressive osseous lesion.

Review of the MIP images confirms the above findings.
IMPRESSION: Chest Impression:

1. No evidence acute pulmonary embolism.
2. Slight increase in volume of the sub pleural loculated fluid in
the RIGHT lower lobe.
3. No evidence of pneumonia.

Abdomen / Pelvis Impression:

1. Stable abscess in the porta hepatis with percutaneous drainage
catheter within the collection. New enlarging abscesses.
2. Stable pneumobilia within the liver. Common bile duct stent in
place.
3. Stable masslike calcification in NOMASIBULELE of the pancreas with
stable duct dilatation.
4. No new or enlarged fluid collections in the abdomen pelvis.

## 2022-01-25 IMAGING — CT CT ANGIO CHEST
2 of 7 series · 15 of 46 positions shown · IV contrast (OMNIPAQUE 350)
Comparison: None Available.

CLINICAL DATA: Short of breath. History of pancreatic cancer with
recent liver resection. * Tracking Code: BO *



[Series 5: thins · axial · 0.68mm/px · z∈[+1442,+1652]mm · 12 of 240 slices shown]
[im 15/240  lung]
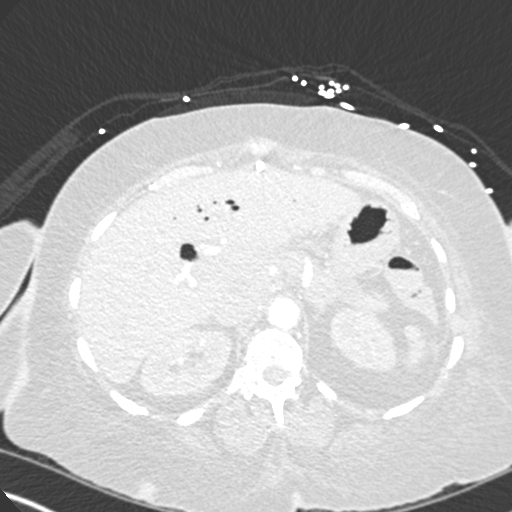
[im 43/240  soft-tissue]
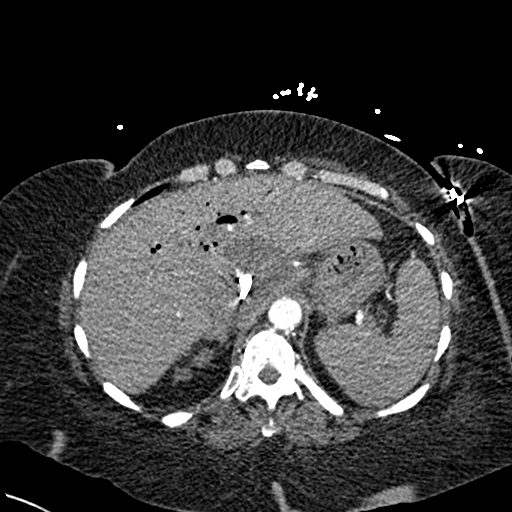
[im 57/240  lung]
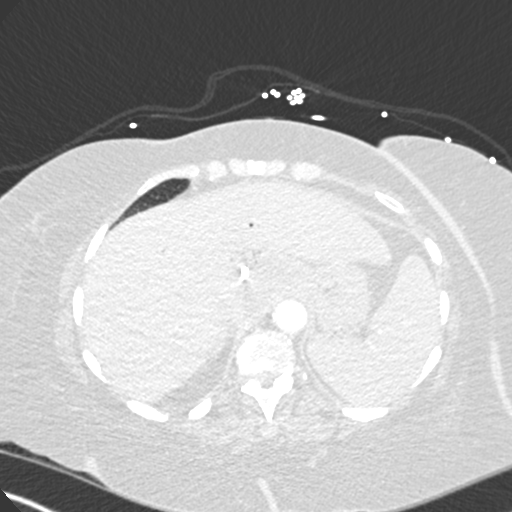
[im 71/240  soft-tissue]
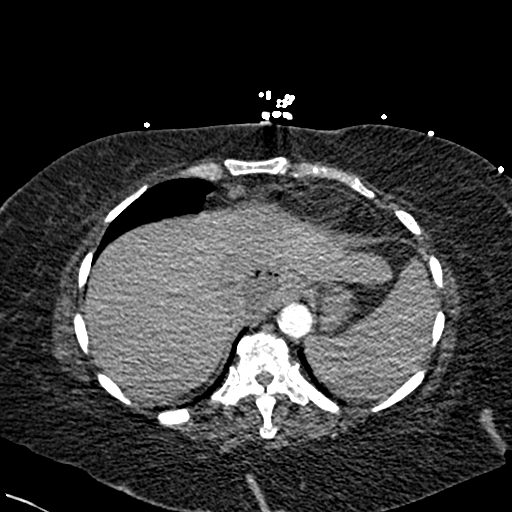
[im 99/240  lung]
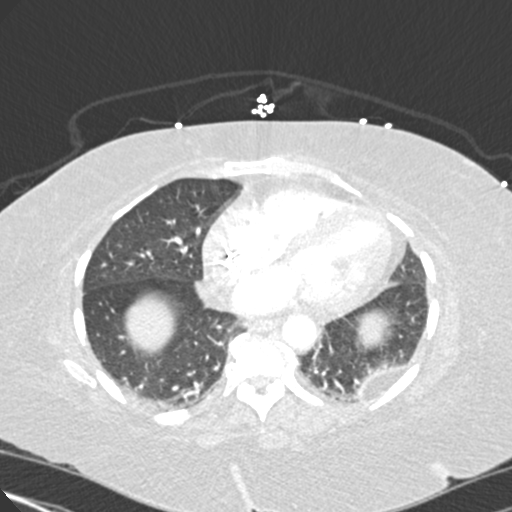
[im 113/240  soft-tissue]
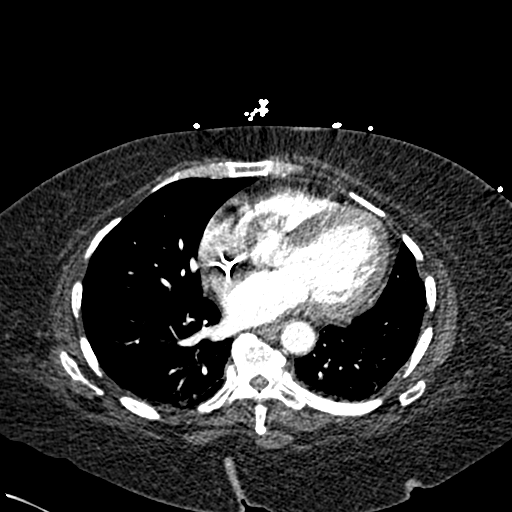
[im 127/240  lung]
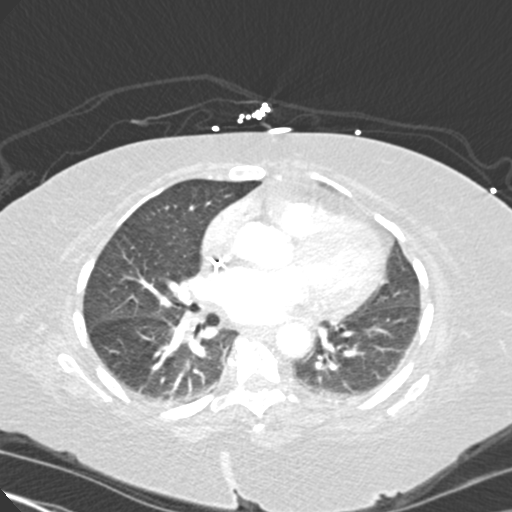
[im 155/240  soft-tissue]
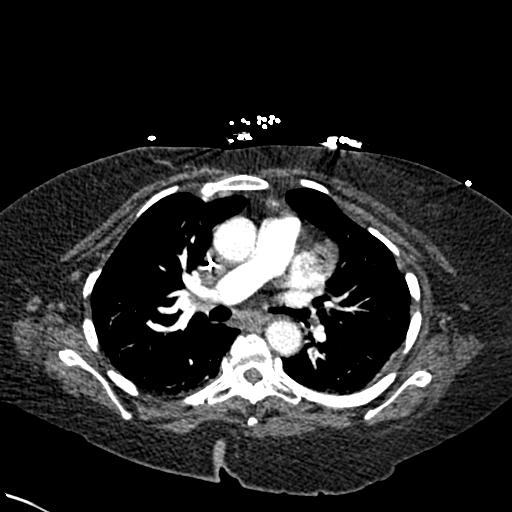
[im 169/240  lung]
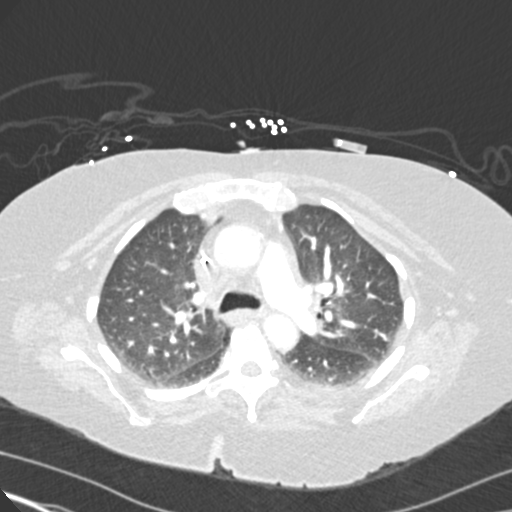
[im 183/240  soft-tissue]
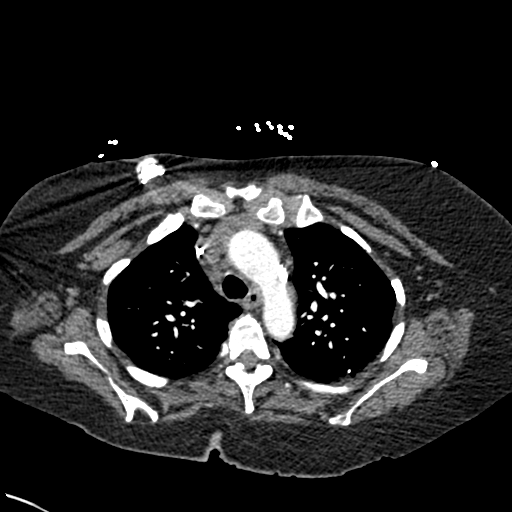
[im 211/240  lung]
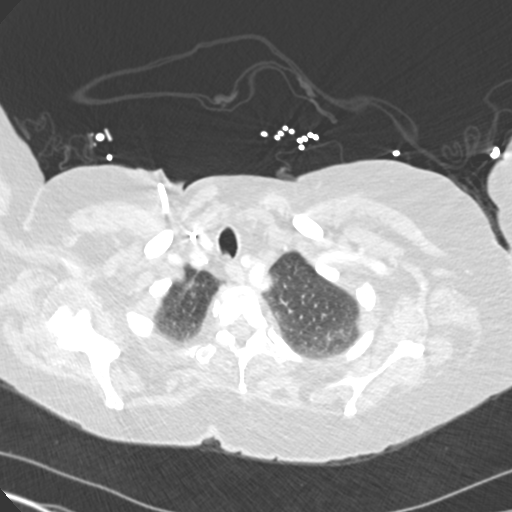
[im 225/240  soft-tissue]
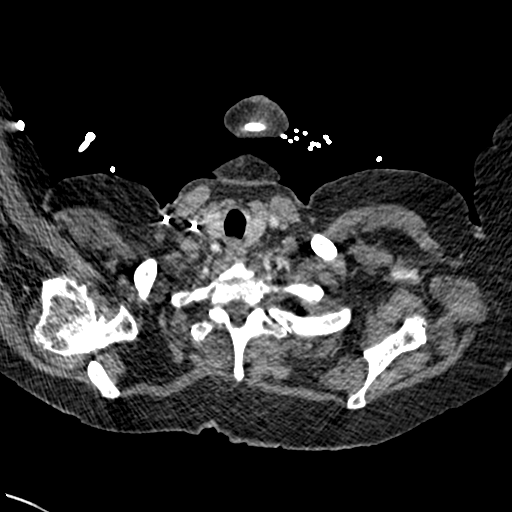

[Series 7: coronal mpr · coronal · 0.55mm/px · 3 of 101 slices shown]
[im 26/101  soft-tissue]
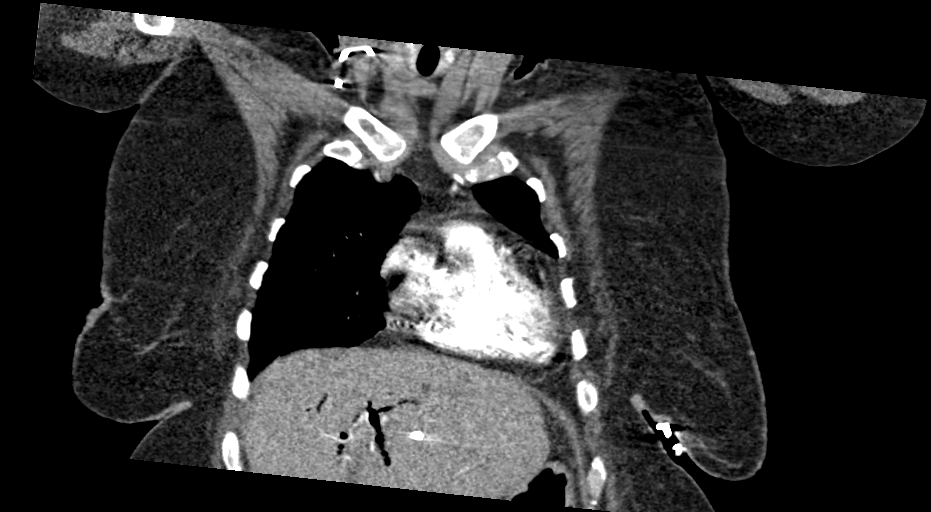
[im 51/101  soft-tissue]
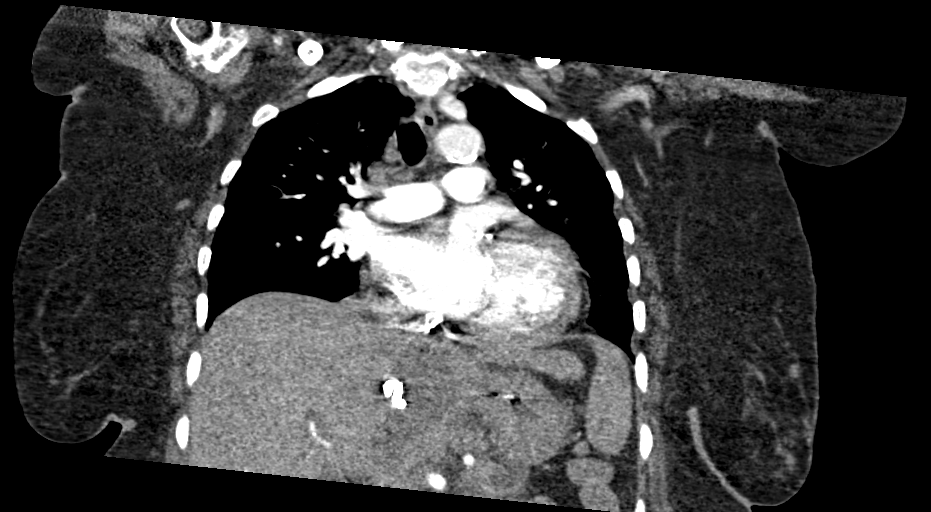
[im 76/101  soft-tissue]
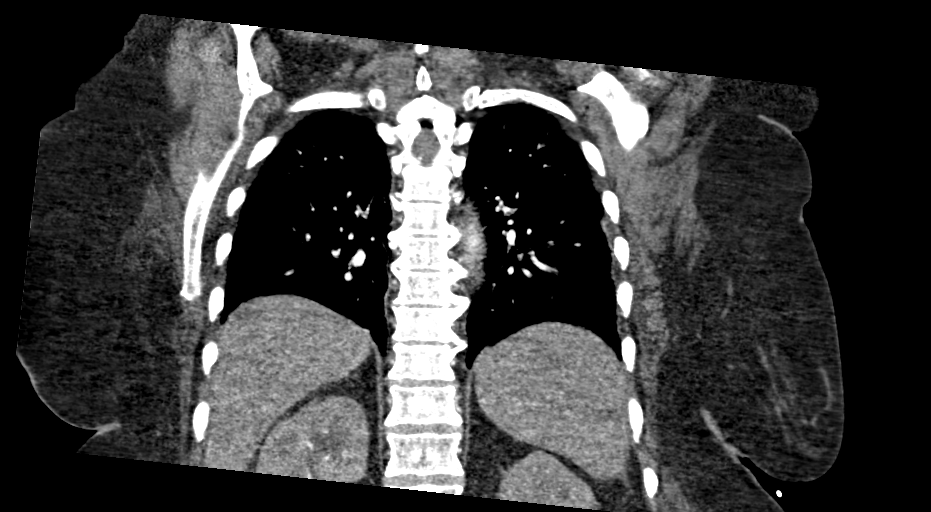

[15 of 46 positions shown; findings below may reference images not displayed]

RADIATION DOSE REDUCTION: This exam was performed according to the
departmental dose-optimization program which includes automated
exposure control, adjustment of the mA and/or kV according to
patient size and/or use of iterative reconstruction technique.

CONTRAST:  100mL OMNIPAQUE IOHEXOL 350 MG/ML SOLN
FINDINGS: CTA CHEST FINDINGS

Cardiovascular: No filling defects within the pulmonary arteries to
suggest acute pulmonary embolism.

Mediastinum/Nodes: No axillary or supraclavicular adenopathy. No
mediastinal or hilar adenopathy. No pericardial fluid. Esophagus
normal.

Lungs/Pleura: No airspace disease. No pneumothorax. No pleural
fluid. Sub pleural loculated fluid in the LEFT lower lobe measures
3.4 x 1.5 cm not changed from 3.7 by 1.5 cm.

Musculoskeletal:

Review of the MIP images confirms the above findings.

CT ABDOMEN and PELVIS FINDINGS

Hepatobiliary: Again demonstrated fluid collection in the porta
hepatis measuring approximately 4.6 x 4.5 cm (image 17/series 2)
compared to 5.6 x 4.6 cm on prior remeasured. There is a
percutaneous drainage catheter with pigtail coiled within the fluid
collection.

There is extensive pneumobilia within the liver unchanged. Common
bile duct stent in place. There is dilatation of the pancreatic duct
to 8 mm. Findings similar to comparison exams.

No new fluid collections associated with the liver or pancreas.

Pancreas: Common bile duct stent. Multiple calcifications in the
head of the pancreas. pancreatic duct dilatation 8 mm similar prior.
Pancreatic atrophy is also noted.

Spleen: Normal spleen

Adrenals/urinary tract: Adrenal glands and kidneys are normal. The
ureters and bladder normal.

Stomach/Bowel: Stomach, small bowel, appendix, and cecum are normal.
The colon and rectosigmoid colon are normal.

Vascular/Lymphatic: Abdominal aorta is normal caliber with
atherosclerotic calcification. There is no retroperitoneal or
periportal lymphadenopathy. No pelvic lymphadenopathy.

Reproductive: Unremarkable

Other: No peritoneal metastasis.  No free fluid.

Musculoskeletal: No aggressive osseous lesion.

Review of the MIP images confirms the above findings.
IMPRESSION: Chest Impression:

1. No evidence acute pulmonary embolism.
2. Slight increase in volume of the sub pleural loculated fluid in
the RIGHT lower lobe.
3. No evidence of pneumonia.

Abdomen / Pelvis Impression:

1. Stable abscess in the porta hepatis with percutaneous drainage
catheter within the collection. New enlarging abscesses.
2. Stable pneumobilia within the liver. Common bile duct stent in
place.
3. Stable masslike calcification in NOMASIBULELE of the pancreas with
stable duct dilatation.
4. No new or enlarged fluid collections in the abdomen pelvis.

## 2022-01-25 MED ORDER — SODIUM CHLORIDE (PF) 0.9 % IJ SOLN
INTRAMUSCULAR | Status: AC
  Filled 2022-01-25: qty 50

## 2022-01-25 MED ORDER — FENTANYL CITRATE PF 50 MCG/ML IJ SOSY
50.0000 ug | PREFILLED_SYRINGE | Freq: Once | INTRAMUSCULAR | Status: AC
  Administered 2022-01-25: 50 ug via INTRAVENOUS
  Filled 2022-01-25: qty 1

## 2022-01-25 MED ORDER — HEPARIN SOD (PORK) LOCK FLUSH 100 UNIT/ML IV SOLN
500.0000 [IU] | Freq: Once | INTRAVENOUS | Status: AC
  Administered 2022-01-25: 500 [IU]
  Filled 2022-01-25: qty 5

## 2022-01-25 MED ORDER — IOHEXOL 350 MG/ML SOLN
100.0000 mL | Freq: Once | INTRAVENOUS | Status: AC | PRN
  Administered 2022-01-25: 100 mL via INTRAVENOUS

## 2022-01-25 MED ORDER — ALUMINUM-MAGNESIUM-SIMETHICONE 200-200-20 MG/5ML PO SUSP: 30.0000 mL | Freq: Three times a day (TID) | ORAL | 0 refills | Status: DC

## 2022-01-25 NOTE — ED Triage Notes (Signed)
Patient BIB sister, c/o SOB with chest pains x2 hours. States she has gas build up in her abdomen. Reports recent surgery with drain from liver.

## 2022-01-25 NOTE — ED Provider Notes (Signed)
Patient's CTs do not show any acute emergent abnormalities.  I personally viewed/interpreted these images which includes no PE in the CT as well as a stable abscess in the abdomen.  I discussed with patient and family.  Her biggest concern is the gas she has been having and she is not sure what to take as they told her that she needs to get medicines approved based on her medicine list.  I think Maalox would be okay based on her medicines.  Will prescribe this to her pharmacy and discharged home with return precautions.  Second troponin is normal.   Sherwood Gambler, MD 01/25/22 [phone]

## 2022-01-25 NOTE — ED Provider Triage Note (Signed)
Emergency Medicine Provider Triage Evaluation Note  Melanie Alvarez , a 66 y.o. female  was evaluated in triage.  Pt complains of sob. Pt with hx of pancreatic CA, had a liver resection done less than a week ago, here with increase SOB for the past few days.  Denies fever, productive cough, hemoptysis.  Thought it may be due to "gas"  Review of Systems  Positive: As above Negative: As above  Physical Exam  BP 130/75 (BP Location: Right Arm)   Pulse (!) 102   Temp 98.1 F (36.7 C)   Resp 14   Ht 5' 3.5" (1.613 m)   Wt 84.8 kg   SpO2 99%   BMI 32.61 kg/m  Gen:   Awake, no distress   Resp:  Normal effort  MSK:   Moves extremities without difficulty  Other:  Recent abd surgery with dressing in place and JP drain tube  Medical Decision Making  Medically screening exam initiated at 12:13 PM.  Appropriate orders placed.  Tamura L Loeb was informed that the remainder of the evaluation will be completed by another provider, this initial triage assessment does not replace that evaluation, and the importance of remaining in the ED until their evaluation is complete.  Need to r/o PE due to recent surgery.    Domenic Moras, PA-C 01/25/22 1214

## 2022-01-25 NOTE — ED Provider Notes (Signed)
[facility] DEPT Provider Note   CSN: [id number] Arrival date & time: 01/25/22  1158     History {Add pertinent medical, surgical, social history, OB history to HPI:1} Chief Complaint  Patient presents with   Shortness of Breath    Melanie Alvarez is a 66 y.o. female.  Presented to the emergency room due to concern for shortness of breath.  Patient reports that earlier today she started having upper abdominal pain and some shortness of breath.  Feels like she has a lot of trapped gas.  No significant nausea or vomiting.  No fevers or chills.  No cough.  Reports that she has continued to have a small amount of drainage daily from her drain.  No change.  Reviewed discharge summary from Dr. Olevia Bowens on 01/22/2022 - 66 y.o. 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 Authorizing Provider  amLODipine (NORVASC) 10 MG tablet Take 1 tablet (10 mg total) by mouth at bedtime. 11/27/21   Nche, Charlene Brooke, NP  amoxicillin-clavulanate (AUGMENTIN) 875-125 MG tablet Take 1 tablet by mouth every 12 (twelve) hours. 01/22/22 03/03/22  Charlynne Cousins, MD  APIXABAN Arne Cleveland) VTE STARTER PACK ('10MG'$  AND '5MG'$ ) Take as directed on package: start with two-'5mg'$  tablets twice daily for 7 days. On day 8, switch to one-'5mg'$  tablet twice daily. Patient taking differently: Take 5 mg by mouth 2 (two) times daily. 12/24/21   Volanda Napoleon, MD  atorvastatin (LIPITOR) 20 MG tablet Take 1 tablet (20 mg total) by mouth at bedtime. 01/25/21   Nche, Charlene Brooke, NP  CREON  [phone] units CPEP capsule TAKE 2 CAPSULES BY MOUTH THREE TIMES DAILY BEFORE MEAL(S) Patient taking differently: Take 72,000 Units by mouth 3 (three) times daily before meals. 11/19/21   Volanda Napoleon, MD  feeding supplement (BOOST HIGH PROTEIN) LIQD Take 2 Containers by mouth in the morning.    [provider]  fentaNYL (DURAGESIC) 25 MCG/HR Place 1 patch onto the skin every 3 (three) days. 01/02/22   Volanda Napoleon, MD  fluticasone (FLONASE) 50 MCG/ACT nasal spray Place 2 sprays into both nostrils daily. Patient taking differently: Place 2 sprays into both nostrils daily as needed for allergies or rhinitis. 05/14/21   Tower, Wynelle Fanny, MD  glipiZIDE (GLUCOTROL XL) 5 MG 24 hr tablet Take 1 tablet (5 mg total) by mouth daily with breakfast. 09/14/21   Nche, Charlene Brooke, NP  glucose blood test strip Test blood glucose once daily. One Touch Verio test strips 09/28/19   Nche, Charlene Brooke, NP  HYDROmorphone (DILAUDID) 4 MG tablet Take 1 tablet (4 mg total) by mouth every 4 (four) hours as needed for severe pain. 01/02/22   Volanda Napoleon, MD  lidocaine-prilocaine (EMLA) cream Apply to affected area once Patient taking differently: 1 application. as needed (for port access). 12/26/21   Volanda Napoleon, MD  metFORMIN (GLUCOPHAGE XR) 750 MG 24 hr tablet Take 1 tablet (750 mg total) by mouth daily with breakfast. 09/14/21   Nche, Charlene Brooke, NP  metoprolol succinate (TOPROL-XL) 25 MG 24 hr tablet TAKE 1 TABLET BY MOUTH  ONCE DAILY Patient not taking: Reported on 01/15/2022 01/25/21   Nche, Charlene Brooke, NP  olmesartan (BENICAR) 5 MG tablet Take 1 tablet (5 mg total) by mouth daily. 01/25/21   Nche, Charlene Brooke, NP  ondansetron (ZOFRAN) 8 MG tablet Take 1 tablet (8 mg total) by mouth 2 (two) times daily as needed (Nausea or vomiting). Patient taking differently: Take 8 mg by mouth 2 (two) times daily as needed for nausea or vomiting. 12/26/21   Volanda Napoleon, MD  pantoprazole (PROTONIX) 40 MG  tablet Take 1 tablet (40 mg total) by mouth 2 (two) times daily for 14 days. Patient not taking: Reported on 01/15/2022 12/04/21 12/18/21  Flossie Buffy, NP  pantoprazole (PROTONIX) 40 MG tablet Take 1 tablet (40 mg total) by mouth daily. 12/24/21   Volanda Napoleon, MD  potassium chloride (KLOR-CON M) 20 MEQ tablet Take 1 tablet (20 mEq total) by mouth 2 (two) times daily. Patient not taking: Reported on 01/15/2022 11/27/21   Nche, Charlene Brooke, NP  prochlorperazine (COMPAZINE) 10 MG tablet Take 1 tablet (10 mg total) by mouth every 6 (six) hours as needed (Nausea or vomiting). Patient taking differently: Take 10 mg by mouth every 6 (six) hours as needed for nausea or vomiting. 12/26/21   Volanda Napoleon, MD  TYLENOL 8 HOUR 650 MG CR tablet Take 650-1,300 mg by mouth every 8 (eight) hours as needed for pain.    [provider]      Allergies    Lisinopril    Review of Systems   Review of Systems  Constitutional:  Positive for fatigue. Negative for chills and fever.  HENT:  Negative for ear pain and sore throat.   Eyes:  Negative for pain and visual disturbance.  Respiratory:  Positive for shortness of breath. Negative for cough.   Cardiovascular:  Negative for chest pain and palpitations.  Gastrointestinal:  Positive for abdominal pain. Negative for nausea and vomiting.  Genitourinary:  Negative for dysuria and hematuria.  Musculoskeletal:  Negative for arthralgias and back pain.  Skin:  Negative for color change and rash.  Neurological:  Negative for seizures and syncope.  All other systems reviewed and are negative.   Physical Exam Updated Vital Signs BP 129/80   Pulse 93   Temp 98.1 F (36.7 C)   Resp 17   Ht 5' 3.5" (1.613 m)   Wt 84.8 kg   SpO2 91%   BMI 32.61 kg/m  Physical Exam Vitals and nursing note reviewed.  Constitutional:      General: She is not in acute distress.    Appearance: She is well-developed.  HENT:     Head: Normocephalic and atraumatic.   Eyes:     Conjunctiva/sclera: Conjunctivae normal.  Cardiovascular:     Rate and Rhythm: Normal rate and regular rhythm.     Heart sounds: No murmur heard. Pulmonary:     Effort: Pulmonary effort is normal. No respiratory distress.     Breath sounds: Normal breath sounds.  Abdominal:     Tenderness: There is abdominal tenderness.     Comments: Generalized tenderness to palpation in her abdomen, drain is intact, no erythema surrounding drain site  Musculoskeletal:        General: No swelling.     Cervical back: Neck supple.  Skin:    General: Skin is warm and dry.     Capillary Refill: Capillary refill takes less than 2 seconds.  Neurological:     Mental Status: She  is alert.  Psychiatric:        Mood and Affect: Mood normal.     ED Results / Procedures / Treatments   Labs (all labs ordered are listed, but only abnormal results are displayed) Labs Reviewed  BRAIN NATRIURETIC PEPTIDE  COMPREHENSIVE METABOLIC PANEL  CBC WITH DIFFERENTIAL/PLATELET  LIPASE, BLOOD  TROPONIN I (HIGH SENSITIVITY)    EKG EKG Interpretation  Date/Time:  Friday January 25 2022 12:32:52 EDT Ventricular Rate:  93 PR Interval:  161 QRS Duration: 100 QT Interval:  340 QTC Calculation: 423 R Axis:   6 Text Interpretation: Sinus rhythm Anteroseptal infarct, age indeterminate Confirmed by Madalyn Rob [phone]) on 01/25/2022 1:24:25 PM  Radiology No results found.  Procedures Procedures  {Document cardiac monitor, telemetry assessment procedure when appropriate:1}  Medications Ordered in ED Medications  fentaNYL (SUBLIMAZE) injection 50 mcg (50 mcg Intravenous Given 01/25/22 1300)    ED Course/ Medical Decision Making/ A&P                           Medical Decision Making Amount and/or Complexity of Data Reviewed Labs: ordered. Radiology: ordered.  Risk Prescription drug management.   66 year old lady with history of pancreatic cancer, portal vein and mesenteric thrombosis on  Eliquis, recent admission for hepatic abscess.  {Document critical care time when appropriate:1} {Document review of labs and clinical decision tools ie heart score, Chads2Vasc2 etc:1}  {Document your independent review of radiology images, and any outside records:1} {Document your discussion with family members, caretakers, and with consultants:1} {Document social determinants of health affecting pt's care:1} {Document your decision making why or why not admission, treatments were needed:1} Final Clinical Impression(s) / ED Diagnoses Final diagnoses:  None    Rx / DC Orders ED Discharge Orders     None

## 2022-01-25 NOTE — Discharge Instructions (Addendum)
If you develop worsening, continued, or recurrent abdominal pain, uncontrolled vomiting, fever, chest or back pain, or any other new/concerning symptoms then return to the ER for evaluation.  

## 2022-01-29 NOTE — Telephone Encounter (Signed)
Transition Care Management Unsuccessful Follow-up Telephone Call  Date of discharge and from where: [facility] 01/22/2022  Attempts:  2nd Attempt  Reason for unsuccessful TCM follow-up call:  No answer/busy

## 2022-01-30 ENCOUNTER — Ambulatory Visit
Admission: RE | Admit: 2022-01-30 | Discharge: 2022-01-30 | Disposition: A | Discharge status: Home / Self Care | Payer: BC Managed Care – PPO | Source: Ambulatory Visit | Attending: Internal Medicine | Admitting: Internal Medicine

## 2022-01-30 ENCOUNTER — Other Ambulatory Visit (HOSPITAL_COMMUNITY): Payer: Self-pay | Admitting: Interventional Radiology

## 2022-01-30 ENCOUNTER — Encounter: Payer: Self-pay | Admitting: *Deleted

## 2022-01-30 DIAGNOSIS — K750 Abscess of liver: Secondary | ICD-10-CM

## 2022-01-30 HISTORY — PX: IR RADIOLOGIST EVAL & MGMT: IMG5224

## 2022-01-30 MED ORDER — IOPAMIDOL (ISOVUE-300) INJECTION 61%
100.0000 mL | Freq: Once | INTRAVENOUS | Status: AC | PRN
  Administered 2022-01-30: 100 mL via INTRAVENOUS

## 2022-01-30 NOTE — Progress Notes (Signed)
Referring Physician(s): [place] T  Supervising Physician: Michaelle Birks  Chief Complaint: The patient is seen in follow up today s/p hepatic abscess drainage on 01/16/22  History of present illness:  66 y.o. 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  Drain placed 01/16/22. She presents today for drain clinic follow up.  She denies fever, chills, vomiting, diarrhea.  She does still experience some nausea.  Appetite is returning.  Energy levels are improving.  She continues her antibiotic and does her own drain care, flushing it 1x/day.  She reports approximately 200cc OP daily.  She denies any difficulty with drain care at home.  Past Medical History:  Diagnosis Date   Cancer ([place])    Cirrhosis ([place])    Diabetes mellitus 2012   Diverticulosis    Gallbladder sludge    Hepatitis 2010   history of Hepatitis C   Hepatitis C    Hypertension 2011   Obesity     Past Surgical History:  Procedure Laterality Date   BILIARY BRUSHING  09/22/2019   Procedure: BILIARY BRUSHING;  Surgeon: Irving Copas., MD;  Location: [facility];  Service: Gastroenterology;;   BILIARY DILATION  09/22/2019   Procedure: BILIARY DILATION;  Surgeon: Irving Copas., MD;  Location: [facility];  Service: Gastroenterology;;   BILIARY STENT PLACEMENT N/A 09/22/2019   Procedure: BILIARY STENT PLACEMENT;  Surgeon: Irving Copas., MD;  Location: [facility];  Service: Gastroenterology;  Laterality: N/A;   COLONOSCOPY  07/24/2011   Procedure: COLONOSCOPY;  Surgeon: Landry Dyke, MD;  Location: [facility];  Service: Endoscopy;  Laterality: N/A;   Dental Surgery Tooth Extraction     ERCP N/A 09/22/2019   Procedure: ENDOSCOPIC RETROGRADE CHOLANGIOPANCREATOGRAPHY (ERCP);  Surgeon:  Irving Copas., MD;  Location: [facility];  Service: Gastroenterology;  Laterality: N/A;   ESOPHAGOGASTRODUODENOSCOPY (EGD) WITH PROPOFOL N/A 09/22/2019   Procedure: ESOPHAGOGASTRODUODENOSCOPY (EGD) WITH PROPOFOL;  Surgeon: Rush Landmark Telford Nab., MD;  Location: [facility];  Service: Gastroenterology;  Laterality: N/A;   ESOPHAGOGASTRODUODENOSCOPY (EGD) WITH PROPOFOL N/A 09/25/2019   Procedure: ESOPHAGOGASTRODUODENOSCOPY (EGD) WITH PROPOFOL;  Surgeon: Rush Landmark Telford Nab., MD;  Location: [facility];  Service: Gastroenterology;  Laterality: N/A;   ESOPHAGOGASTRODUODENOSCOPY (EGD) WITH PROPOFOL N/A 01/19/2020   Procedure: ESOPHAGOGASTRODUODENOSCOPY (EGD) WITH PROPOFOL;  Surgeon: Rush Landmark Telford Nab., MD;  Location: [facility];  Service: Gastroenterology;  Laterality: N/A;   EUS N/A 09/22/2019   Procedure: UPPER ENDOSCOPIC ULTRASOUND (EUS) RADIAL;  Surgeon: Irving Copas., MD;  Location: [facility];  Service: Gastroenterology;  Laterality: N/A;   EUS N/A 01/19/2020   Procedure: UPPER ENDOSCOPIC ULTRASOUND (EUS) RADIAL;  Surgeon: Irving Copas., MD;  Location: [facility];  Service: Gastroenterology;  Laterality: N/A;   FIDUCIAL MARKER PLACEMENT N/A 01/19/2020   Procedure: FIDUCIAL MARKER PLACEMENT;  Surgeon: Irving Copas., MD;  Location: [facility];  Service: Gastroenterology;  Laterality: N/A;   FINE NEEDLE ASPIRATION  09/22/2019   Procedure: FINE NEEDLE ASPIRATION (FNA) LINEAR;  Surgeon: Irving Copas., MD;  Location: [facility];  Service: Gastroenterology;;   FINE NEEDLE ASPIRATION  09/25/2019   Procedure: FINE NEEDLE ASPIRATION (FNA) RADIAL;  Surgeon: Irving Copas., MD;  Location: [facility];  Service: Gastroenterology;;   HEMOSTASIS CLIP PLACEMENT  09/25/2019   Procedure: HEMOSTASIS CLIP PLACEMENT;  Surgeon: Irving Copas., MD;  Location: [facility];  Service: Gastroenterology;;  IR IMAGING GUIDED PORT INSERTION  09/24/2019    IR IMAGING GUIDED PORT INSERTION  12/31/2021   IR REMOVAL TUN ACCESS W/ PORT W/O FL MOD SED  06/16/2020   SPHINCTEROTOMY  09/22/2019   Procedure: SPHINCTEROTOMY;  Surgeon: Mansouraty, Telford Nab., MD;  Location: [facility];  Service: Gastroenterology;;   UPPER ESOPHAGEAL ENDOSCOPIC ULTRASOUND (EUS) N/A 09/25/2019   Procedure: UPPER ESOPHAGEAL ENDOSCOPIC ULTRASOUND (EUS);  Surgeon: Irving Copas., MD;  Location: [facility];  Service: Gastroenterology;  Laterality: N/A;    Allergies: Lisinopril  Medications: Prior to Admission medications   Medication Sig Start Date End Date Taking? Authorizing Provider  aluminum-magnesium hydroxide-simethicone (MAALOX) [phone] MG/5ML SUSP Take 30 mLs by mouth 4 (four) times daily -  before meals and at bedtime. 01/25/22   Sherwood Gambler, MD  amLODipine (NORVASC) 10 MG tablet Take 1 tablet (10 mg total) by mouth at bedtime. 11/27/21   Nche, Charlene Brooke, NP  amoxicillin-clavulanate (AUGMENTIN) 875-125 MG tablet Take 1 tablet by mouth every 12 (twelve) hours. 01/22/22 03/03/22  Charlynne Cousins, MD  APIXABAN Arne Cleveland) VTE STARTER PACK ('10MG'$  AND '5MG'$ ) Take as directed on package: start with two-'5mg'$  tablets twice daily for 7 days. On day 8, switch to one-'5mg'$  tablet twice daily. Patient taking differently: Take 5 mg by mouth 2 (two) times daily. 12/24/21   Volanda Napoleon, MD  atorvastatin (LIPITOR) 20 MG tablet Take 1 tablet (20 mg total) by mouth at bedtime. 01/25/21   Nche, Charlene Brooke, NP  CREON [phone] units CPEP capsule TAKE 2 CAPSULES BY MOUTH THREE TIMES DAILY BEFORE MEAL(S) Patient taking differently: Take 72,000 Units by mouth 3 (three) times daily before meals. 11/19/21   Volanda Napoleon, MD  feeding supplement (BOOST HIGH PROTEIN) LIQD Take 2 Containers by mouth in the morning.    [provider]  fentaNYL (DURAGESIC) 25 MCG/HR Place 1 patch onto the skin every 3 (three) days. 01/02/22   Volanda Napoleon, MD  fluticasone  (FLONASE) 50 MCG/ACT nasal spray Place 2 sprays into both nostrils daily. Patient taking differently: Place 2 sprays into both nostrils daily as needed for allergies or rhinitis. 05/14/21   Tower, Wynelle Fanny, MD  glipiZIDE (GLUCOTROL XL) 5 MG 24 hr tablet Take 1 tablet (5 mg total) by mouth daily with breakfast. 09/14/21   Nche, Charlene Brooke, NP  glucose blood test strip Test blood glucose once daily. One Touch Verio test strips 09/28/19   Nche, Charlene Brooke, NP  HYDROmorphone (DILAUDID) 4 MG tablet Take 1 tablet (4 mg total) by mouth every 4 (four) hours as needed for severe pain. 01/02/22   Volanda Napoleon, MD  lidocaine-prilocaine (EMLA) cream Apply to affected area once Patient taking differently: 1 application. as needed (for port access). 12/26/21   Volanda Napoleon, MD  metFORMIN (GLUCOPHAGE XR) 750 MG 24 hr tablet Take 1 tablet (750 mg total) by mouth daily with breakfast. 09/14/21   Nche, Charlene Brooke, NP  metoprolol succinate (TOPROL-XL) 25 MG 24 hr tablet TAKE 1 TABLET BY MOUTH ONCE DAILY Patient not taking: Reported on 01/15/2022 01/25/21   Nche, Charlene Brooke, NP  olmesartan (BENICAR) 5 MG tablet Take 1 tablet (5 mg total) by mouth daily. 01/25/21   Nche, Charlene Brooke, NP  ondansetron (ZOFRAN) 8 MG tablet Take 1 tablet (8 mg total) by mouth 2 (two) times daily as needed (Nausea or vomiting). Patient taking differently: Take 8 mg by mouth 2 (two) times daily as needed for nausea or vomiting. 12/26/21  Volanda Napoleon, MD  pantoprazole (PROTONIX) 40 MG tablet Take 1 tablet (40 mg total) by mouth 2 (two) times daily for 14 days. Patient not taking: Reported on 01/15/2022 12/04/21 12/18/21  Flossie Buffy, NP  pantoprazole (PROTONIX) 40 MG tablet Take 1 tablet (40 mg total) by mouth daily. 12/24/21   Volanda Napoleon, MD  potassium chloride (KLOR-CON M) 20 MEQ tablet Take 1 tablet (20 mEq total) by mouth 2 (two) times daily. Patient not taking: Reported on 01/15/2022 11/27/21   Nche, Charlene Brooke, NP   prochlorperazine (COMPAZINE) 10 MG tablet Take 1 tablet (10 mg total) by mouth every 6 (six) hours as needed (Nausea or vomiting). Patient taking differently: Take 10 mg by mouth every 6 (six) hours as needed for nausea or vomiting. 12/26/21   Volanda Napoleon, MD  TYLENOL 8 HOUR 650 MG CR tablet Take 650-1,300 mg by mouth every 8 (eight) hours as needed for pain.    [provider]     Family History  Problem Relation Age of Onset   Diabetes Sister    Cancer Maternal Grandmother        leukemia    Diabetes Maternal Grandfather    Diabetes Paternal Grandfather    Diabetes Paternal Grandmother    Hypertension Sister    Hypertension Brother    Anesthesia problems Neg Hx     Social History   Socioeconomic History   Marital status: Married    Spouse name: Not on file   Number of children: Not on file   Years of education: Not on file   Highest education level: Not on file  Occupational History   Not on file  Tobacco Use   Smoking status: Former    Packs/day: 0.25    Types: Cigarettes    Quit date: 09/20/2019    Years since quitting: 2.3   Smokeless tobacco: Never  Vaping Use   Vaping Use: Never used  Substance and Sexual Activity   Alcohol use: Not Currently    Comment: quit 2022   Drug use: No    Comment: previously    Sexual activity: Yes    Partners: Male    Birth control/protection: Post-menopausal  Other Topics Concern   Not on file  Social History Narrative   Previous [facility] pt.  Last MD was Dr. Delman Cheadle, seen 12/2011      Works part time in [employer] with husband   Social Determinants of Radio broadcast assistant Strain: Not on [employer] Insecurity: Not on file  Transportation Needs: Not on file  Physical Activity: Not on file  Stress: Not on file  Social Connections: Not on file     Vital Signs: There were no vitals taken for this visit.  Physical Exam Constitutional:      General: She is not in  acute distress. HENT:     Head: Normocephalic.     Mouth/Throat:     Mouth: Mucous membranes are moist.     Pharynx: Oropharynx is clear.  Pulmonary:     Effort: Pulmonary effort is normal. No respiratory distress.  Abdominal:     Palpations: Abdomen is soft.     Tenderness: There is no abdominal tenderness.  Skin:    General: Skin is warm and dry.  Neurological:     General: No focal deficit present.     Mental Status: She is alert and oriented to person, place, and  time.  Psychiatric:        Mood and Affect: Mood normal.        Behavior: Behavior normal.   Drain: approximately 20cc purulent OP in bag.  Skin is clean and intact.  No erythema or drainage.  Catheter is secured with suture and dressing is appropriate.  Imaging: No results found.  Labs:  CBC: Recent Labs    01/16/22 0500 01/18/22 0621 01/22/22 0740 01/25/22 1240  WBC 9.9 9.5 10.9* 10.6*  HGB 11.0* 11.1* 11.6* 11.0*  HCT 34.5* 33.7* 35.8* 33.7*  PLT 422* 455* 374 369    COAGS: Recent Labs    01/16/22 1325  INR 3.7*    BMP: Recent Labs    01/17/22 1234 01/18/22 0621 01/22/22 0740 01/25/22 1240  NA 132* 134* 128* 130*  K 3.1* 3.6 3.9 3.4*  CL 100 99 90* 94*  CO2 '24 26 29 26  '$ GLUCOSE 157* 163* 193* 137*  BUN 7* 5* 5* 6*  CALCIUM 7.5* 8.1* 8.4* 8.8*  CREATININE 0.61 0.48 0.56 0.57  GFRNONAA >60 >60 >60 >60    LIVER FUNCTION TESTS: Recent Labs    01/16/22 0500 01/18/22 0621 01/22/22 0740 01/25/22 1240  BILITOT 0.7 0.7 0.6 0.8  AST 39 32 76* 38  ALT 32 28 46* 32  ALKPHOS 103 95 127* 98  PROT 6.5 6.7 7.5 7.8  ALBUMIN 2.0* 2.1* 2.4* 2.6*    Assessment and Plan:  Hepatic abscess: --improving clinically --CT shows drain retracted.  Collection needs to have better access.  Arranged for repositioning/exchange at [facility] tomorrow 01/31/22 at [place], arrival at 8:30.   --Pt and son expresses understanding. --Plan for 2-3 week clinic f/u with CT and possible drain  injection.  Electronically Signed: Pasty Spillers 01/30/2022, 1:32 PM   Please refer to Dr. Maryelizabeth Kaufmann attestation of this note for management and plan.

## 2022-01-31 ENCOUNTER — Telehealth: Payer: Self-pay

## 2022-01-31 ENCOUNTER — Ambulatory Visit (HOSPITAL_COMMUNITY)
Admission: RE | Admit: 2022-01-31 | Discharge: 2022-01-31 | Disposition: A | Discharge status: Home / Self Care | Payer: BC Managed Care – PPO | Source: Ambulatory Visit | Attending: Interventional Radiology | Admitting: Interventional Radiology

## 2022-01-31 ENCOUNTER — Other Ambulatory Visit (HOSPITAL_COMMUNITY): Payer: Self-pay | Admitting: Interventional Radiology

## 2022-01-31 DIAGNOSIS — Z4803 Encounter for change or removal of drains: Secondary | ICD-10-CM | POA: Diagnosis present

## 2022-01-31 DIAGNOSIS — K750 Abscess of liver: Secondary | ICD-10-CM

## 2022-01-31 HISTORY — PX: IR CATHETER TUBE CHANGE: IMG717

## 2022-01-31 IMAGING — XA IR FISTULA/SINUS TRACT
4 series · 11 of 11 positions shown · non-contrast
Comparison: Previous day

INDICATION: Retracted drainage catheter

EXAM:
Exchange and repositioning of percutaneous drainage catheter using
fluoroscopic guidance
TECHNIQUE: Informed written consent was obtained from the patient after a
thorough discussion of the procedural risks, benefits and
alternatives. All questions were addressed. Maximal Sterile Barrier
Technique was utilized including caps, mask, sterile gowns, sterile
gloves, sterile drape, hand hygiene and skin antiseptic. A timeout
was performed prior to the initiation of the procedure.

[Series 1: single · 1 of 1 slices shown]
[im 1/1]
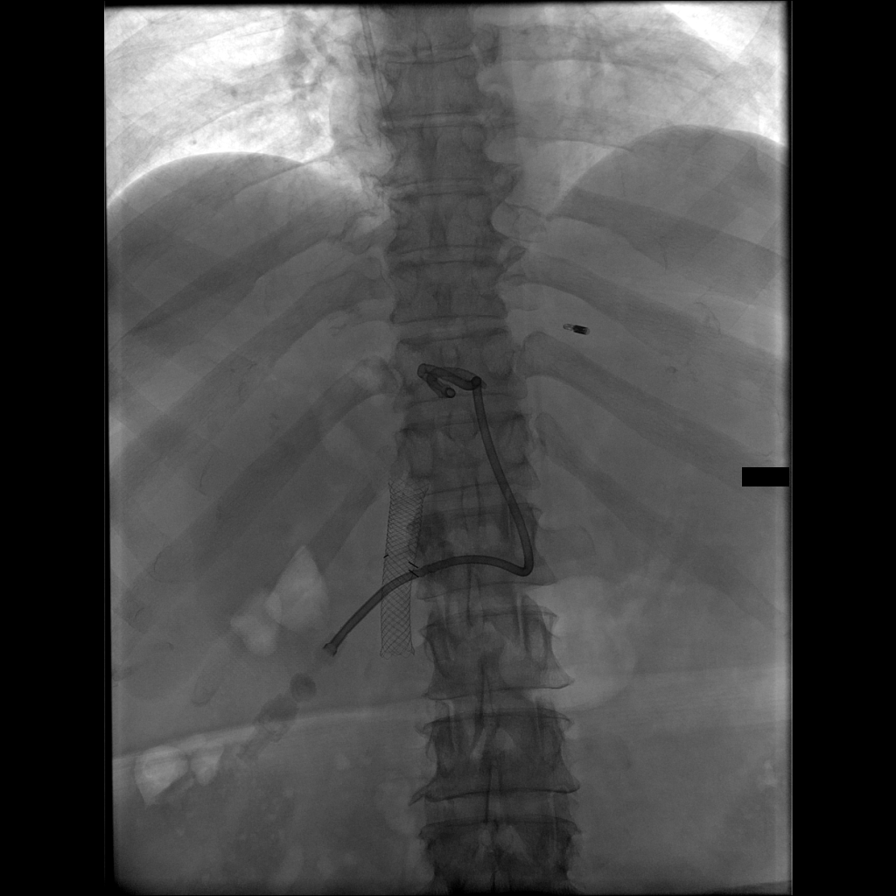

[Series 2: fl (-) angio · 4 of 26 frames shown (1 of 3)]
[frame 4/26]
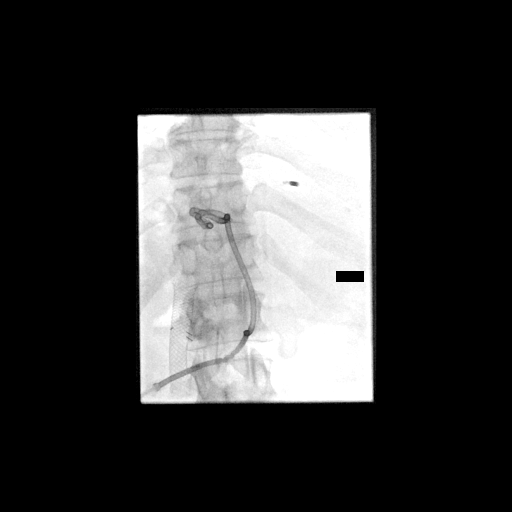
[frame 8/26]
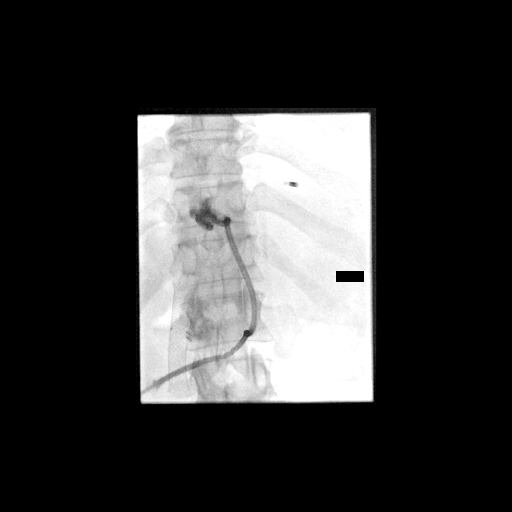
[frame 14/26]
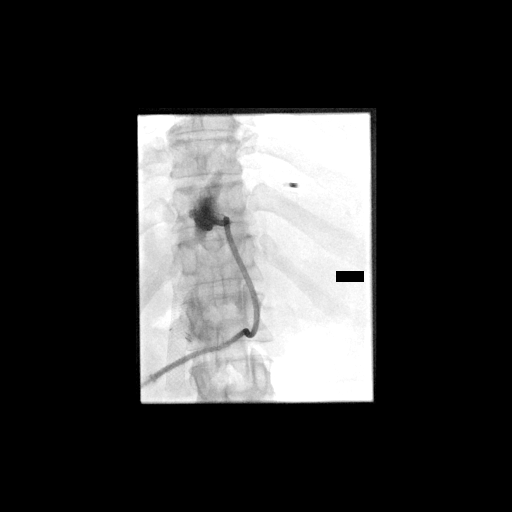
[frame 23/26]
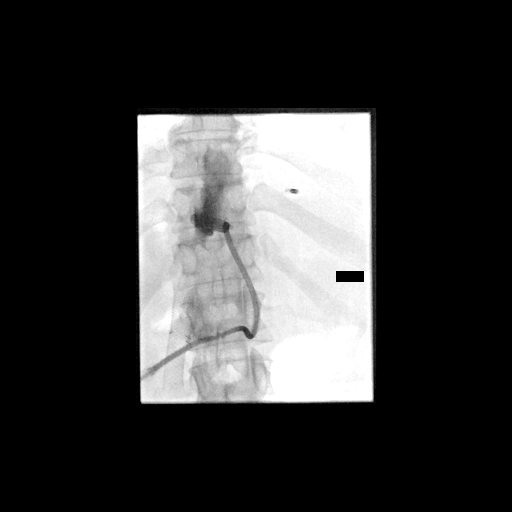

[Series 3: fl (-) angio · 2 acquisitions, 5 frames shown (2 of 3)]
[im 1/2]
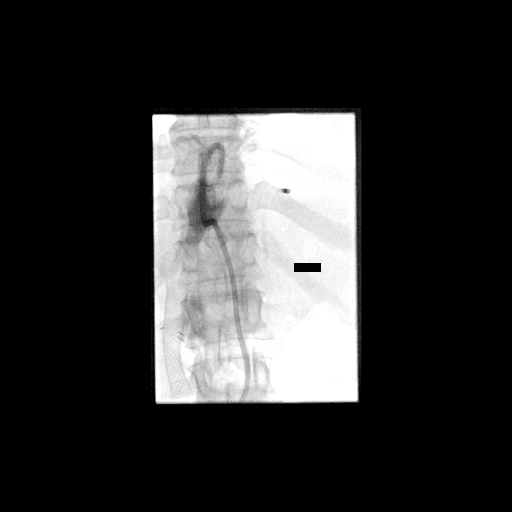
[im 1/2]
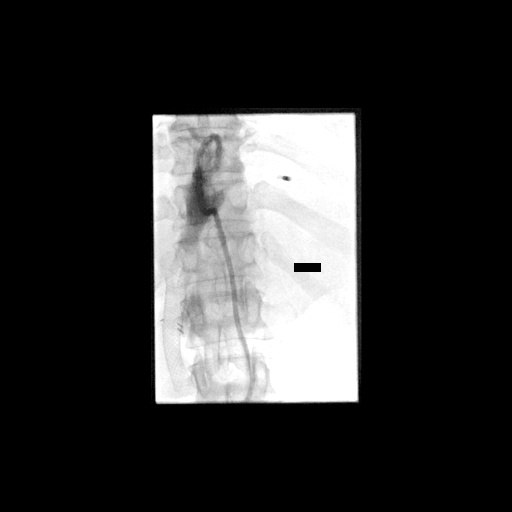
[im 1/2]
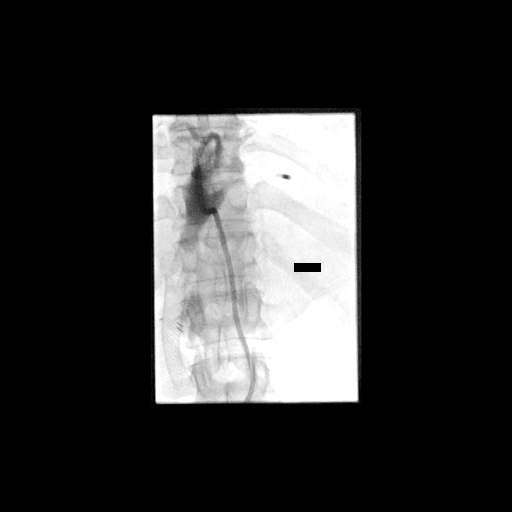
[im 1/2]
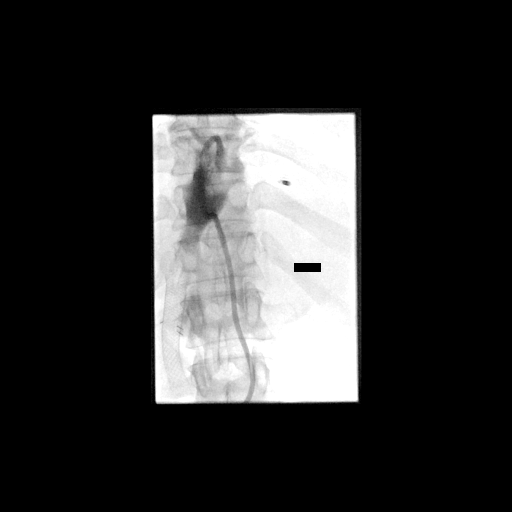
[im 2/2]
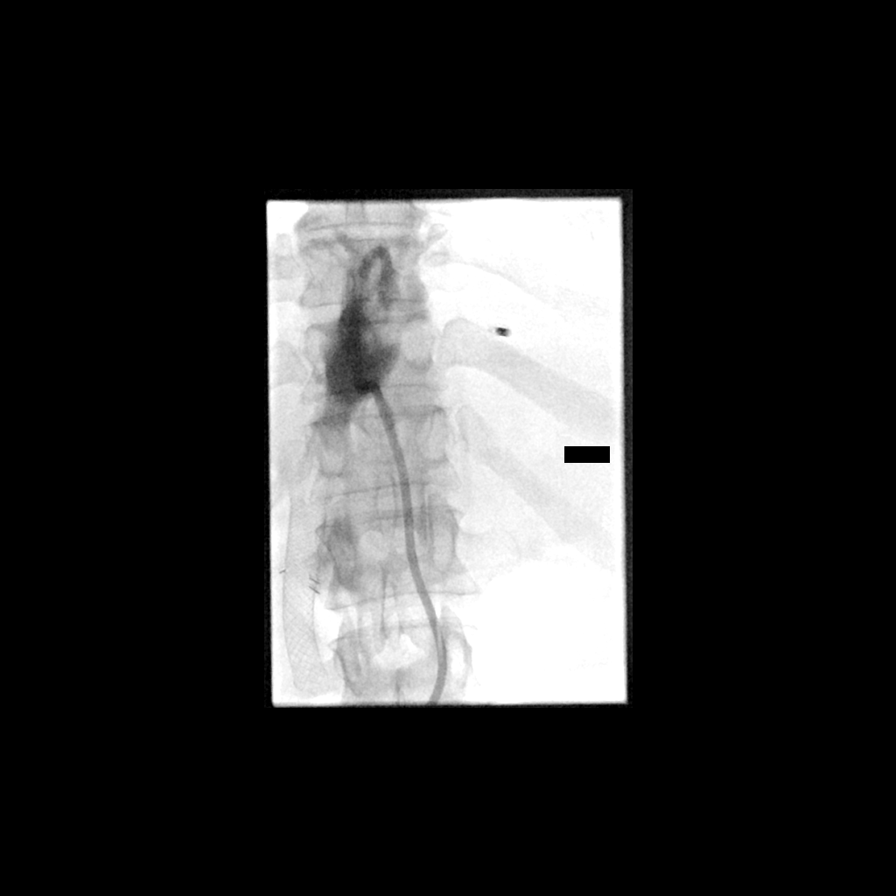

[Series 4: fl (-) angio · 1 of 1 slices shown (3 of 3)]
[im 1/1]
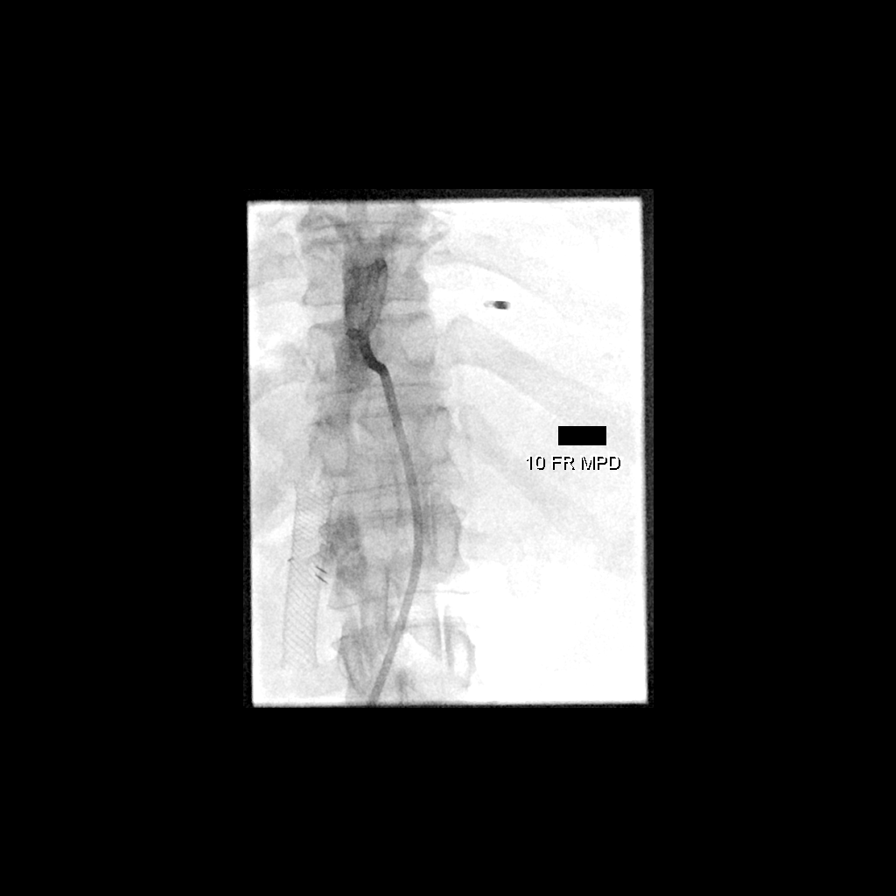

[11 of 11 positions shown; findings below may reference images not displayed]

MEDICATIONS:
Per EMR

ANESTHESIA/SEDATION:
Local analgesia

COMPLICATIONS:
None immediate.

FLUOROSCOPY:
Fluoroscopic time: 0.9 minutes (10 mGy)
The patient was placed supine on the exam table. The mid abdomen was
prepped and draped in a standard sterile fashion with inclusion of
the existing percutaneous drainage catheter within the sterile
field. Initial injection of the drainage catheter demonstrated a
more superior component of the cavity, consistent with recent CT.
The locking loop was released, and over an Amplatz wire, the
existing catheter was removed. A new 10 French multipurpose locking
drainage catheter was then advanced over the wire and repositioned
in a more superior location. Location was confirmed with gentle
injection of contrast material. Locking loop was formed, and the
catheter was secured to the skin using silk suture and a dressing.
It was attached to bag drainage. The patient tolerated the procedure
well without immediate complication.

PROCEDURE:
See above.
IMPRESSION: Successful exchange and reposition of percutaneous drainage catheter
for a new, similar 10 French locking drainage catheter. Drainage
catheter was repositioned in a more superior location consistent
with recent CT findings. Patient will continue drain follow-up with
interventional radiology as scheduled.

## 2022-01-31 MED ORDER — LIDOCAINE HCL (PF) 1 % IJ SOLN
INTRAMUSCULAR | Status: DC | PRN
  Administered 2022-01-31: 5 mL

## 2022-01-31 MED ORDER — LIDOCAINE HCL 1 % IJ SOLN
INTRAMUSCULAR | Status: AC
  Filled 2022-01-31: qty 20

## 2022-01-31 MED ORDER — IOHEXOL 300 MG/ML  SOLN
100.0000 mL | Freq: Once | INTRAMUSCULAR | Status: AC | PRN
  Administered 2022-01-31: 10 mL

## 2022-01-31 NOTE — Telephone Encounter (Addendum)
Left voicemail asking patient to return my call.    [place], CMA    Hi Melanie Alvarez  --   This is Dr Juleen China covering for Dr Gale Journey.  I got the results of your CT scan from today and noted that you will see interventional radiology again tomorrow for a drain repositioning/exchange.  Please continue taking the Augmentin twice daily as prescribed and follow up with Dr Gale Journey as scheduled on 7/5 at 3pm.    Thanks, Jule Ser

## 2022-02-01 ENCOUNTER — Other Ambulatory Visit: Payer: Self-pay | Admitting: Hematology & Oncology

## 2022-02-01 MED ORDER — HYDROMORPHONE HCL 4 MG PO TABS: 4.0000 mg | ORAL_TABLET | ORAL | 0 refills | Status: DC | PRN

## 2022-02-05 ENCOUNTER — Telehealth: Payer: Self-pay | Admitting: *Deleted

## 2022-02-05 ENCOUNTER — Telehealth: Payer: Self-pay

## 2022-02-05 NOTE — Telephone Encounter (Signed)
Patient called wanting to know how long drain will need to be in place as it is uncomfortable. Advised her to call GI doctor, she has not made an appointment with them yet and was unaware they were trying to reach out and schedule her.   Provided her with [facility] phone number to schedule. Advised that Dr. Gale Journey manages her antibiotics and if she has questions about the drain she will need to call IR or GI. Patient verbalized understanding and has no further questions.   Beryle Flock, RN

## 2022-02-05 NOTE — Telephone Encounter (Signed)
Spoke with patient, advised her to call radiology so that they can reassess the drain. Patient verbalized understanding and has no further questions.   Beryle Flock, RN

## 2022-02-05 NOTE — Telephone Encounter (Signed)
Returned patient's phone call regarding questions about the drain. I instructed her to call IR or GI and follow up with them. She verbalized understanding.

## 2022-02-10 ENCOUNTER — Other Ambulatory Visit: Payer: Self-pay | Admitting: Nurse Practitioner

## 2022-02-10 DIAGNOSIS — I10 Essential (primary) hypertension: Secondary | ICD-10-CM

## 2022-02-11 ENCOUNTER — Telehealth: Payer: Self-pay | Admitting: *Deleted

## 2022-02-11 ENCOUNTER — Other Ambulatory Visit: Payer: Self-pay | Admitting: *Deleted

## 2022-02-11 ENCOUNTER — Ambulatory Visit (HOSPITAL_COMMUNITY)
Admission: RE | Admit: 2022-02-11 | Discharge: 2022-02-11 | Disposition: A | Discharge status: Home / Self Care | Payer: BC Managed Care – PPO | Source: Ambulatory Visit | Attending: Hematology & Oncology | Admitting: Hematology & Oncology

## 2022-02-11 DIAGNOSIS — Z434 Encounter for attention to other artificial openings of digestive tract: Secondary | ICD-10-CM | POA: Diagnosis present

## 2022-02-11 DIAGNOSIS — C250 Malignant neoplasm of head of pancreas: Secondary | ICD-10-CM | POA: Insufficient documentation

## 2022-02-11 DIAGNOSIS — C251 Malignant neoplasm of body of pancreas: Secondary | ICD-10-CM

## 2022-02-11 HISTORY — PX: IR CHOLANGIOGRAM EXISTING TUBE: IMG6040

## 2022-02-11 MED ORDER — IOHEXOL 300 MG/ML  SOLN
50.0000 mL | Freq: Once | INTRAMUSCULAR | Status: AC | PRN
Start: 2022-02-11 — End: 2022-02-11
  Administered 2022-02-11: 10 mL

## 2022-02-11 MED ORDER — ALPRAZOLAM 1 MG PO TABS: 0.5000 mg | ORAL_TABLET | Freq: Three times a day (TID) | ORAL | 0 refills | Status: AC | PRN

## 2022-02-11 NOTE — Telephone Encounter (Signed)
Chart Supports Rx Last OV: 11/2021 Next OV not scheduled, gave 30 day supply needs appt for further refills.

## 2022-02-11 NOTE — Procedures (Signed)
Interventional Radiology Procedure:   Indications: Drain check.  Bloody output  Procedure: Drain injection  Findings: Small amount of light red fluid in bag.  Drain injection demonstrates small residual collection.  No fistula tract to bowel or vascular structures  Complications: No immediate complications noted.     EBL: Minimal  Plan: Needs follow up in drain clinic with CT.   Sully Dyment R. Lowella Dandy, MD  Pager: [phone]

## 2022-02-12 ENCOUNTER — Other Ambulatory Visit: Payer: Self-pay | Admitting: Internal Medicine

## 2022-02-12 ENCOUNTER — Telehealth: Payer: Self-pay | Admitting: *Deleted

## 2022-02-12 DIAGNOSIS — C251 Malignant neoplasm of body of pancreas: Secondary | ICD-10-CM

## 2022-02-12 NOTE — Telephone Encounter (Signed)
This nurse returned patient's phone call regarding several questions. Patient stated,"Thank you for calling me back.I woke up dehydrated and my tongue is so dry. I want to know what I can take to keep it moist? I told her to drink water, ice chips, jello, decaf tea and coffee. She said she drinks one Ensure/day to get some calories. I'm trying to eat solid foods to keep me feeling full longer. I agreed with her. I told her to keep her scheduled appointment on Thursday, 6/29, for CT scan of the drain. All questions were answered and she verbalized understanding.

## 2022-02-13 ENCOUNTER — Other Ambulatory Visit: Payer: Self-pay | Admitting: Hematology & Oncology

## 2022-02-13 DIAGNOSIS — C250 Malignant neoplasm of head of pancreas: Secondary | ICD-10-CM

## 2022-02-14 ENCOUNTER — Ambulatory Visit
Admission: RE | Admit: 2022-02-14 | Discharge: 2022-02-14 | Disposition: A | Discharge status: Home / Self Care | Payer: BC Managed Care – PPO | Source: Ambulatory Visit | Attending: Internal Medicine | Admitting: Internal Medicine

## 2022-02-14 DIAGNOSIS — C251 Malignant neoplasm of body of pancreas: Secondary | ICD-10-CM

## 2022-02-14 HISTORY — PX: IR RADIOLOGIST EVAL & MGMT: IMG5224

## 2022-02-14 MED ORDER — IOPAMIDOL (ISOVUE-300) INJECTION 61%
100.0000 mL | Freq: Once | INTRAVENOUS | Status: AC | PRN
  Administered 2022-02-14: 100 mL via INTRAVENOUS

## 2022-02-14 NOTE — Progress Notes (Signed)
Chief Complaint: Hepatic abscess drain  Referring Physician(s): Clapp,Susan T  History of Present Illness: Melanie Alvarez is a 66 y.o. female with history of pancreatic malignancy who developed a hepatic abscess and underwent CT guided drain placement on 01/16/2022.  She returns to clinic today for follow up.  She has had less than 5 mL output over the past 3 days.  She denies pain, fever, and chills.  Past Medical History:  Diagnosis Date   Cancer ([place])    Cirrhosis ([place])    Diabetes mellitus 2012   Diverticulosis    Gallbladder sludge    Hepatitis 2010   history of Hepatitis C   Hepatitis C    Hypertension 2011   Obesity     Past Surgical History:  Procedure Laterality Date   BILIARY BRUSHING  09/22/2019   Procedure: BILIARY BRUSHING;  Surgeon: Irving Copas., MD;  Location: [facility];  Service: Gastroenterology;;   BILIARY DILATION  09/22/2019   Procedure: BILIARY DILATION;  Surgeon: Irving Copas., MD;  Location: [facility];  Service: Gastroenterology;;   BILIARY STENT PLACEMENT N/A 09/22/2019   Procedure: BILIARY STENT PLACEMENT;  Surgeon: Irving Copas., MD;  Location: [facility];  Service: Gastroenterology;  Laterality: N/A;   COLONOSCOPY  07/24/2011   Procedure: COLONOSCOPY;  Surgeon: Landry Dyke, MD;  Location: [facility];  Service: Endoscopy;  Laterality: N/A;   Dental Surgery Tooth Extraction     ERCP N/A 09/22/2019   Procedure: ENDOSCOPIC RETROGRADE CHOLANGIOPANCREATOGRAPHY (ERCP);  Surgeon: Irving Copas., MD;  Location: [facility];  Service: Gastroenterology;  Laterality: N/A;   ESOPHAGOGASTRODUODENOSCOPY (EGD) WITH PROPOFOL N/A 09/22/2019   Procedure: ESOPHAGOGASTRODUODENOSCOPY (EGD) WITH PROPOFOL;  Surgeon: Rush Landmark Telford Nab., MD;  Location: [facility];  Service: Gastroenterology;  Laterality: N/A;   ESOPHAGOGASTRODUODENOSCOPY (EGD) WITH PROPOFOL N/A 09/25/2019   Procedure: ESOPHAGOGASTRODUODENOSCOPY (EGD)  WITH PROPOFOL;  Surgeon: Rush Landmark Telford Nab., MD;  Location: [facility];  Service: Gastroenterology;  Laterality: N/A;   ESOPHAGOGASTRODUODENOSCOPY (EGD) WITH PROPOFOL N/A 01/19/2020   Procedure: ESOPHAGOGASTRODUODENOSCOPY (EGD) WITH PROPOFOL;  Surgeon: Rush Landmark Telford Nab., MD;  Location: [place];  Service: Gastroenterology;  Laterality: N/A;   EUS N/A 09/22/2019   Procedure: UPPER ENDOSCOPIC ULTRASOUND (EUS) RADIAL;  Surgeon: Irving Copas., MD;  Location: [facility];  Service: Gastroenterology;  Laterality: N/A;   EUS N/A 01/19/2020   Procedure: UPPER ENDOSCOPIC ULTRASOUND (EUS) RADIAL;  Surgeon: Irving Copas., MD;  Location: [place];  Service: Gastroenterology;  Laterality: N/A;   FIDUCIAL MARKER PLACEMENT N/A 01/19/2020   Procedure: FIDUCIAL MARKER PLACEMENT;  Surgeon: Irving Copas., MD;  Location: [place];  Service: Gastroenterology;  Laterality: N/A;   FINE NEEDLE ASPIRATION  09/22/2019   Procedure: FINE NEEDLE ASPIRATION (FNA) LINEAR;  Surgeon: Irving Copas., MD;  Location: [facility];  Service: Gastroenterology;;   FINE NEEDLE ASPIRATION  09/25/2019   Procedure: FINE NEEDLE ASPIRATION (FNA) RADIAL;  Surgeon: Irving Copas., MD;  Location: [facility];  Service: Gastroenterology;;   HEMOSTASIS CLIP PLACEMENT  09/25/2019   Procedure: HEMOSTASIS CLIP PLACEMENT;  Surgeon: Irving Copas., MD;  Location: [facility];  Service: Gastroenterology;;   IR CATHETER TUBE CHANGE  01/31/2022   IR CHOLANGIOGRAM EXISTING TUBE  02/11/2022   IR IMAGING GUIDED PORT INSERTION  09/24/2019   IR IMAGING GUIDED PORT INSERTION  12/31/2021   IR RADIOLOGIST EVAL & MGMT  01/30/2022   IR REMOVAL TUN ACCESS W/ PORT W/O FL MOD SED  06/16/2020   SPHINCTEROTOMY  09/22/2019  Procedure: SPHINCTEROTOMY;  Surgeon: Rush Landmark Telford Nab., MD;  Location: [facility];  Service: Gastroenterology;;   UPPER ESOPHAGEAL ENDOSCOPIC ULTRASOUND (EUS) N/A 09/25/2019    Procedure: UPPER ESOPHAGEAL ENDOSCOPIC ULTRASOUND (EUS);  Surgeon: Irving Copas., MD;  Location: [facility];  Service: Gastroenterology;  Laterality: N/A;    Allergies: Lisinopril  Medications: Prior to Admission medications   Medication Sig Start Date End Date Taking? Authorizing Provider  ALPRAZolam Duanne Moron) 1 MG tablet Take 0.5 tablets (0.5 mg total) by mouth every 8 (eight) hours as needed for anxiety. 02/11/22   Volanda Napoleon, MD  aluminum-magnesium hydroxide-simethicone (MAALOX) [phone] MG/5ML SUSP Take 30 mLs by mouth 4 (four) times daily -  before meals and at bedtime. 01/25/22   Sherwood Gambler, MD  amLODipine (NORVASC) 10 MG tablet Take 1 tablet (10 mg total) by mouth at bedtime. 11/27/21   Nche, Charlene Brooke, NP  amoxicillin-clavulanate (AUGMENTIN) 875-125 MG tablet Take 1 tablet by mouth every 12 (twelve) hours. 01/22/22 03/03/22  Charlynne Cousins, MD  APIXABAN Arne Cleveland) VTE STARTER PACK ('10MG'$  AND '5MG'$ ) Take as directed on package: start with two-'5mg'$  tablets twice daily for 7 days. On day 8, switch to one-'5mg'$  tablet twice daily. Patient taking differently: Take 5 mg by mouth 2 (two) times daily. 12/24/21   Volanda Napoleon, MD  atorvastatin (LIPITOR) 20 MG tablet Take 1 tablet (20 mg total) by mouth at bedtime. 01/25/21   Nche, Charlene Brooke, NP  CREON [phone] units CPEP capsule TAKE 2 CAPSULES BY MOUTH THREE TIMES DAILY BEFORE MEAL(S) 02/01/22   Volanda Napoleon, MD  feeding supplement (BOOST HIGH PROTEIN) LIQD Take 2 Containers by mouth in the morning.    [provider]  fentaNYL (DURAGESIC) 25 MCG/HR Place 1 patch onto the skin every 3 (three) days. 01/02/22   Volanda Napoleon, MD  fluticasone (FLONASE) 50 MCG/ACT nasal spray Place 2 sprays into both nostrils daily. Patient taking differently: Place 2 sprays into both nostrils daily as needed for allergies or rhinitis. 05/14/21   Tower, Wynelle Fanny, MD  glipiZIDE (GLUCOTROL XL) 5 MG 24 hr tablet Take 1 tablet (5  mg total) by mouth daily with breakfast. 09/14/21   Nche, Charlene Brooke, NP  glucose blood test strip Test blood glucose once daily. One Touch Verio test strips 09/28/19   Nche, Charlene Brooke, NP  HYDROmorphone (DILAUDID) 4 MG tablet Take 1 tablet (4 mg total) by mouth every 4 (four) hours as needed for severe pain. 02/01/22   Volanda Napoleon, MD  lidocaine-prilocaine (EMLA) cream Apply to affected area once Patient taking differently: 1 application. as needed (for port access). 12/26/21   Volanda Napoleon, MD  metFORMIN (GLUCOPHAGE XR) 750 MG 24 hr tablet Take 1 tablet (750 mg total) by mouth daily with breakfast. 09/14/21   Nche, Charlene Brooke, NP  metoprolol succinate (TOPROL-XL) 25 MG 24 hr tablet TAKE 1 TABLET BY MOUTH ONCE DAILY Patient not taking: Reported on 01/15/2022 01/25/21   Nche, Charlene Brooke, NP  olmesartan (BENICAR) 5 MG tablet Take 1 tablet by mouth once daily 02/11/22   Nche, Charlene Brooke, NP  ondansetron (ZOFRAN) 8 MG tablet Take 1 tablet (8 mg total) by mouth 2 (two) times daily as needed (Nausea or vomiting). Patient taking differently: Take 8 mg by mouth 2 (two) times daily as needed for nausea or vomiting. 12/26/21   Volanda Napoleon, MD  pantoprazole (PROTONIX) 40 MG tablet Take 1 tablet (40 mg total) by mouth 2 (two) times daily for  14 days. Patient not taking: Reported on 01/15/2022 12/04/21 12/18/21  Flossie Buffy, NP  pantoprazole (PROTONIX) 40 MG tablet Take 1 tablet (40 mg total) by mouth daily. 12/24/21   Volanda Napoleon, MD  potassium chloride (KLOR-CON M) 20 MEQ tablet Take 1 tablet (20 mEq total) by mouth 2 (two) times daily. Patient not taking: Reported on 01/15/2022 11/27/21   Nche, Charlene Brooke, NP  prochlorperazine (COMPAZINE) 10 MG tablet Take 1 tablet (10 mg total) by mouth every 6 (six) hours as needed (Nausea or vomiting). Patient taking differently: Take 10 mg by mouth every 6 (six) hours as needed for nausea or vomiting. 12/26/21   Volanda Napoleon, MD  TYLENOL 8  HOUR 650 MG CR tablet Take 650-1,300 mg by mouth every 8 (eight) hours as needed for pain.    [provider]     Family History  Problem Relation Age of Onset   Diabetes Sister    Cancer Maternal Grandmother        leukemia    Diabetes Maternal Grandfather    Diabetes Paternal Grandfather    Diabetes Paternal Grandmother    Hypertension Sister    Hypertension Brother    Anesthesia problems Neg Hx     Social History   Socioeconomic History   Marital status: Married    Spouse name: Not on file   Number of children: Not on file   Years of education: Not on file   Highest education level: Not on file  Occupational History   Not on file  Tobacco Use   Smoking status: Former    Packs/day: 0.25    Types: Cigarettes    Quit date: 09/20/2019    Years since quitting: 2.4   Smokeless tobacco: Never  Vaping Use   Vaping Use: Never used  Substance and Sexual Activity   Alcohol use: Not Currently    Comment: quit 2022   Drug use: No    Comment: previously    Sexual activity: Yes    Partners: Male    Birth control/protection: Post-menopausal  Other Topics Concern   Not on file  Social History Narrative   Previous [facility] pt.  Last MD was Dr. Delman Cheadle, seen 12/2011      Works part time in [employer] with husband   Social Determinants of Radio broadcast assistant Strain: Not on [employer] Insecurity: Not on file  Transportation Needs: Not on file  Physical Activity: Not on file  Stress: Not on file  Social Connections: Not on file    ECOG Status: 1 - Symptomatic but completely ambulatory  Review of Systems: A 12 point ROS discussed and pertinent positives are indicated in the HPI above.  All other systems are negative.  Review of Systems  Vital Signs: There were no vitals taken for this visit.  Physical Exam Abdominal:     General: Abdomen is flat.     Palpations: Abdomen is soft.     Tenderness: There is no  abdominal tenderness.  Skin:    General: Skin is warm and dry.     Coloration: Skin is not jaundiced.     Findings: No bruising.  Neurological:     Mental Status: She is oriented to person, place, and time.  Psychiatric:        Mood and Affect: Mood normal.     Imaging: CT ABDOMEN PELVIS W CONTRAST  Result Date: 02/14/2022 CLINICAL DATA:  66 year old woman with history pancreatic malignancy and hepatic abscess status post drain placement 01/16/2022 returns to IR for drain evaluation. EXAM: CT ABDOMEN AND PELVIS WITH CONTRAST TECHNIQUE: Multidetector CT imaging of the abdomen and pelvis was performed using the standard protocol following bolus administration of intravenous contrast. RADIATION DOSE REDUCTION: This exam was performed according to the departmental dose-optimization program which includes automated exposure control, adjustment of the mA and/or kV according to patient size and/or use of iterative reconstruction technique. CONTRAST:  155m ISOVUE-300 IOPAMIDOL (ISOVUE-300) INJECTION 61% COMPARISON:  CT abdomen pelvis with contrast 01/30/2022 FINDINGS: Lower chest: No acute abnormality. Prominent subpleural fat again seen in the left posterolateral chest. Hepatobiliary: Pneumobilia and mild intrahepatic ductal dilatation consistent with prior sphincterotomy and metallic common bile duct stent placement. There has been interval resolution of previously seen hepatic abscess. Liver abscess drain is in appropriate position. Pancreas: Pancreatic head mass is unchanged. Unchanged dilatation main pancreatic duct measuring up to 14 mm. Spleen: Normal in size without focal abnormality. Adrenals/Urinary Tract: Adrenal glands: Normal Kidneys: 1.4 cm fat containing lesion in the lower pole of the right kidney is not significantly changed in size dating back to 05/25/2020 and is consistent with a benign angiomyolipoma. Kidneys otherwise normal. Ureters: Normal Bladder: Normal Stomach/Bowel: Stomach is  within normal limits. Appendix appears normal. No evidence of bowel wall thickening, distention, or inflammatory changes. Vascular/Lymphatic: Atherosclerotic calcifications seen scattered throughout the abdominal aorta. No enlarged abdominal or pelvic lymph nodes. Reproductive: Uterus and bilateral adnexa are unremarkable. Other: No abdominal wall hernia or abnormality. No abdominopelvic ascites. Musculoskeletal: No acute or significant osseous findings. IMPRESSION: 1. Interval resolution of hepatic abscess. Hepatic drain is in appropriate position. 2. Unchanged pancreatic head mass and dilated pancreatic duct. Electronically Signed   By: FMiachel RouxM.D.   On: 02/14/2022 13:15   IR CHOLANGIOGRAM EXISTING TUBE  Result Date: 02/14/2022 INDICATION: 66year old with subhepatic abscess drain. Patient reports bright red blood coming out of the drain. EXAM: DRAIN INJECTION WITH FLUOROSCOPY MEDICATIONS: None ANESTHESIA/SEDATION: None CONTRAST:  10 mL Omnipaque 300 FLUOROSCOPY TIME:  Radiation Exposure Index (as provided by the fluoroscopic device): 5 mGy Kerma COMPLICATIONS: None immediate. PROCEDURE: Patient was placed supine on the interventional table. Contrast was injected through the abdominal drain. Contrast was aspirated at the end of the procedure. Drain was flushed with saline and attached to gravity bag. FINDINGS: Contrast fills a small collection cephalad to the drain. This collection extends to the top of the abdomen along the hemidiaphragms. There is no opacification of the biliary or vascular system. Tip of the Port-A-Cath is identified in the right atrium. Majority of the contrast was aspirated after the drain injection. IMPRESSION: Drain is adequately positioned. There is a small residual collection cephalad to the drain and no connection to the vascular system or biliary system. This collection was completely decompressed with the drain at the end of the procedure. Patient will be scheduled for  follow-up CT to evaluate for drain removal. Electronically Signed   By: AMarkus DaftM.D.   On: 02/14/2022 11:27   IR Catheter Tube Change  Result Date: 01/31/2022 INDICATION: Retracted drainage catheter EXAM: Exchange and repositioning of percutaneous drainage catheter using fluoroscopic guidance COMPARISON:  Previous day MEDICATIONS: Per EMR ANESTHESIA/SEDATION: Local analgesia COMPLICATIONS: None immediate. FLUOROSCOPY: Fluoroscopic time: 0.9 minutes (10 mGy) TECHNIQUE: Informed written consent was obtained from the patient after a thorough discussion of the procedural risks, benefits and alternatives. All questions were addressed. Maximal Sterile Barrier Technique was utilized including caps,  mask, sterile gowns, sterile gloves, sterile drape, hand hygiene and skin antiseptic. A timeout was performed prior to the initiation of the procedure. The patient was placed supine on the exam table. The mid abdomen was prepped and draped in a standard sterile fashion with inclusion of the existing percutaneous drainage catheter within the sterile field. Initial injection of the drainage catheter demonstrated a more superior component of the cavity, consistent with recent CT. The locking loop was released, and over an Amplatz wire, the existing catheter was removed. A new 10 French multipurpose locking drainage catheter was then advanced over the wire and repositioned in a more superior location. Location was confirmed with gentle injection of contrast material. Locking loop was formed, and the catheter was secured to the skin using silk suture and a dressing. It was attached to bag drainage. The patient tolerated the procedure well without immediate complication. PROCEDURE: See above. IMPRESSION: Successful exchange and reposition of percutaneous drainage catheter for a new, similar 10 French locking drainage catheter. Drainage catheter was repositioned in a more superior location consistent with recent CT findings.  Patient will continue drain follow-up with interventional radiology as scheduled. Electronically Signed   By: Albin Felling M.D.   On: 01/31/2022 14:56   CT ABDOMEN PELVIS W CONTRAST  Result Date: 01/30/2022 CLINICAL DATA:  Follow-up hepatic abscess. History of pancreatic cancer. EXAM: CT ABDOMEN AND PELVIS WITH CONTRAST TECHNIQUE: Multidetector CT imaging of the abdomen and pelvis was performed using the standard protocol following bolus administration of intravenous contrast. RADIATION DOSE REDUCTION: This exam was performed according to the departmental dose-optimization program which includes automated exposure control, adjustment of the mA and/or kV according to patient size and/or use of iterative reconstruction technique. CONTRAST:  166m ISOVUE-300 IOPAMIDOL (ISOVUE-300) INJECTION 61% COMPARISON:  CT AP, 01/15/2022.  IR CT, 01/16/2022. FINDINGS: Lower chest: No acute abnormality. Hepatobiliary: Significant decrease in size of well-defined hepatic abscess, measuring 3.5 x 2.4 x 6.0 cm, previously 4.6 x 4.5 x 7.6 cm. Midline abdominal drain appears retracted, with pigtail tip positioned anterior inferior with respect to the collection. Metallic biliary stent and pneumobilia, demonstrating patency. No gallstones or gallbladder wall thickening. Pancreas: Similar appearance of ill-defined pancreatic head mass resulting in pancreatic duct dilation and atrophy. The pancreatic duct measures 0.7 and 1.4 cm at the proximal and distal portions respectively, previously 0.8 and 1.4 cm. Coarse pancreatic head calcifications and surgical clips. Spleen: Normal in size without focal abnormality. Small perihilar and inferior pole accessory spleens. Adrenals/Urinary Tract: Adrenal glands are unremarkable. Similar size and appearance of RIGHT inferior renal pole indeterminate, exophytic renal mass measuring approximately 1.1 x 1.1 x 1.4 cm (AP by transaxial by CC). The LEFT kidney is otherwise normal, without renal  calculi, focal lesion, or hydronephrosis. Bladder is unremarkable. Stomach/Bowel: Stomach is within normal limits. Appendix is not definitively visualized. No evidence of bowel wall thickening, distention, or inflammatory changes. Vascular/Lymphatic: Aortic atherosclerosis. No enlarged abdominal or pelvic lymph nodes. Reproductive: Uterus and adnexa are unremarkable. Pelvic phlebolith versus torsed fibroid versus epiploic appendage. Other: No abdominal wall hernia or abnormality. No abdominopelvic ascites. Musculoskeletal: No acute or aggressive osseous lesion. IMPRESSION: Since CT AP dated 01/25/2022; 1. Significant decrease in size of known hepatic abscess, as above, however the drain appears retracted with tip positioned anterior-inferior to the collection. Consider repositioning for appropriate placement. 2. Pancreatic head mass with corresponding duct dilation and pancreas atrophy, unchanged 3. Metallic biliary stent and pneumobilia, demonstrating patency. No biliary ductal dilation. 4. 1.4 cm indeterminate, exophytic  RIGHT inferior pole renal mass. Findings suspicious for potential neoplasm. 5.  Additional incidental, chronic and senescent findings as above. Electronically Signed   By: Michaelle Birks M.D.   On: 01/30/2022 14:17   IR Radiologist Eval & Mgmt  Result Date: 01/30/2022 Please refer to notes tab for details about interventional procedure. (Op Note)  CT Angio Chest PE W and/or Wo Contrast  Result Date: 01/25/2022 CLINICAL DATA:  Short of breath. History of pancreatic cancer with recent liver resection. * Tracking Code: BO * EXAM: CT ANGIOGRAPHY CHEST CT ABDOMEN AND PELVIS WITH CONTRAST TECHNIQUE: Multidetector CT imaging of the chest was performed using the standard protocol during bolus administration of intravenous contrast. Multiplanar CT image reconstructions and MIPs were obtained to evaluate the vascular anatomy. Multidetector CT imaging of the abdomen and pelvis was performed using the  standard protocol during bolus administration of intravenous contrast. RADIATION DOSE REDUCTION: This exam was performed according to the departmental dose-optimization program which includes automated exposure control, adjustment of the mA and/or kV according to patient size and/or use of iterative reconstruction technique. CONTRAST:  163m OMNIPAQUE IOHEXOL 350 MG/ML SOLN COMPARISON:  None Available. FINDINGS: CTA CHEST FINDINGS Cardiovascular: No filling defects within the pulmonary arteries to suggest acute pulmonary embolism. Mediastinum/Nodes: No axillary or supraclavicular adenopathy. No mediastinal or hilar adenopathy. No pericardial fluid. Esophagus normal. Lungs/Pleura: No airspace disease. No pneumothorax. No pleural fluid. Sub pleural loculated fluid in the LEFT lower lobe measures 3.4 x 1.5 cm not changed from 3.7 by 1.5 cm. Musculoskeletal: Review of the MIP images confirms the above findings. CT ABDOMEN and PELVIS FINDINGS Hepatobiliary: Again demonstrated fluid collection in the porta hepatis measuring approximately 4.6 x 4.5 cm (image 17/series 2) compared to 5.6 x 4.6 cm on prior remeasured. There is a percutaneous drainage catheter with pigtail coiled within the fluid collection. There is extensive pneumobilia within the liver unchanged. Common bile duct stent in place. There is dilatation of the pancreatic duct to 8 mm. Findings similar to comparison exams. No new fluid collections associated with the liver or pancreas. Pancreas: Common bile duct stent. Multiple calcifications in the head of the pancreas. pancreatic duct dilatation 8 mm similar prior. Pancreatic atrophy is also noted. Spleen: Normal spleen Adrenals/urinary tract: Adrenal glands and kidneys are normal. The ureters and bladder normal. Stomach/Bowel: Stomach, small bowel, appendix, and cecum are normal. The colon and rectosigmoid colon are normal. Vascular/Lymphatic: Abdominal aorta is normal caliber with atherosclerotic  calcification. There is no retroperitoneal or periportal lymphadenopathy. No pelvic lymphadenopathy. Reproductive: Unremarkable Other: No peritoneal metastasis.  No free fluid. Musculoskeletal: No aggressive osseous lesion. Review of the MIP images confirms the above findings. IMPRESSION: Chest Impression: 1. No evidence acute pulmonary embolism. 2. Slight increase in volume of the sub pleural loculated fluid in the RIGHT lower lobe. 3. No evidence of pneumonia. Abdomen / Pelvis Impression: 1. Stable abscess in the porta hepatis with percutaneous drainage catheter within the collection. New enlarging abscesses. 2. Stable pneumobilia within the liver. Common bile duct stent in place. 3. Stable masslike calcification in teh head of the pancreas with stable duct dilatation. 4. No new or enlarged fluid collections in the abdomen pelvis. Electronically Signed   By: SSuzy BouchardM.D.   On: 01/25/2022 15:28   CT ABDOMEN PELVIS W CONTRAST  Result Date: 01/25/2022 CLINICAL DATA:  Short of breath. History of pancreatic cancer with recent liver resection. * Tracking Code: BO * EXAM: CT ANGIOGRAPHY CHEST CT ABDOMEN AND PELVIS WITH CONTRAST TECHNIQUE: Multidetector  CT imaging of the chest was performed using the standard protocol during bolus administration of intravenous contrast. Multiplanar CT image reconstructions and MIPs were obtained to evaluate the vascular anatomy. Multidetector CT imaging of the abdomen and pelvis was performed using the standard protocol during bolus administration of intravenous contrast. RADIATION DOSE REDUCTION: This exam was performed according to the departmental dose-optimization program which includes automated exposure control, adjustment of the mA and/or kV according to patient size and/or use of iterative reconstruction technique. CONTRAST:  169m OMNIPAQUE IOHEXOL 350 MG/ML SOLN COMPARISON:  None Available. FINDINGS: CTA CHEST FINDINGS Cardiovascular: No filling defects within the  pulmonary arteries to suggest acute pulmonary embolism. Mediastinum/Nodes: No axillary or supraclavicular adenopathy. No mediastinal or hilar adenopathy. No pericardial fluid. Esophagus normal. Lungs/Pleura: No airspace disease. No pneumothorax. No pleural fluid. Sub pleural loculated fluid in the LEFT lower lobe measures 3.4 x 1.5 cm not changed from 3.7 by 1.5 cm. Musculoskeletal: Review of the MIP images confirms the above findings. CT ABDOMEN and PELVIS FINDINGS Hepatobiliary: Again demonstrated fluid collection in the porta hepatis measuring approximately 4.6 x 4.5 cm (image 17/series 2) compared to 5.6 x 4.6 cm on prior remeasured. There is a percutaneous drainage catheter with pigtail coiled within the fluid collection. There is extensive pneumobilia within the liver unchanged. Common bile duct stent in place. There is dilatation of the pancreatic duct to 8 mm. Findings similar to comparison exams. No new fluid collections associated with the liver or pancreas. Pancreas: Common bile duct stent. Multiple calcifications in the head of the pancreas. pancreatic duct dilatation 8 mm similar prior. Pancreatic atrophy is also noted. Spleen: Normal spleen Adrenals/urinary tract: Adrenal glands and kidneys are normal. The ureters and bladder normal. Stomach/Bowel: Stomach, small bowel, appendix, and cecum are normal. The colon and rectosigmoid colon are normal. Vascular/Lymphatic: Abdominal aorta is normal caliber with atherosclerotic calcification. There is no retroperitoneal or periportal lymphadenopathy. No pelvic lymphadenopathy. Reproductive: Unremarkable Other: No peritoneal metastasis.  No free fluid. Musculoskeletal: No aggressive osseous lesion. Review of the MIP images confirms the above findings. IMPRESSION: Chest Impression: 1. No evidence acute pulmonary embolism. 2. Slight increase in volume of the sub pleural loculated fluid in the RIGHT lower lobe. 3. No evidence of pneumonia. Abdomen / Pelvis  Impression: 1. Stable abscess in the porta hepatis with percutaneous drainage catheter within the collection. New enlarging abscesses. 2. Stable pneumobilia within the liver. Common bile duct stent in place. 3. Stable masslike calcification in teh head of the pancreas with stable duct dilatation. 4. No new or enlarged fluid collections in the abdomen pelvis. Electronically Signed   By: SSuzy BouchardM.D.   On: 01/25/2022 15:28   CT ABDOMEN PELVIS WO CONTRAST  Result Date: 01/19/2022 CLINICAL DATA:  Intra-abdominal abscess follow up abscess. 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 RADIATION DOSE REDUCTION: This exam was performed according to the departmental dose-optimization program which includes automated exposure control, adjustment of the mA and/or kV according to patient size and/or use of iterative reconstruction technique. COMPARISON:  CT abdomen pelvis 01/15/2022 FINDINGS: Lower chest: Bilateral lower lobe subsegmental atelectasis. Hepatobiliary: No focal liver abnormality. No gallstones, gallbladder wall thickening, or pericholecystic fluid. No biliary dilatation.Redemonstration of a common bile duct stent in stable position. Persistent pneumobilia Pancreas: Grossly  unremarkable known pancreatic mass is again noted along the proximal pancreas and difficult to measure on this noncontrast study. Normal pancreatic contour. No surrounding inflammatory changes. No main pancreatic ductal dilatation. Spleen: Normal in size without focal abnormality. Adrenals/Urinary Tract: No adrenal nodule bilaterally. No nephrolithiasis and no hydronephrosis. Redemonstration of a 1.4 cm exophytic right inferior renal pole  lesion with a density of -17 Hounsfield units. No ureterolithiasis or hydroureter. The urinary bladder is unremarkable. Stomach/Bowel: Stomach is within normal limits. No evidence of bowel wall thickening or dilatation. Appendix appears normal. Vascular/Lymphatic: No abdominal aorta or iliac aneurysm. Moderate atherosclerotic plaque of the aorta and its branches. No abdominal, pelvic, or inguinal lymphadenopathy. Reproductive: Uterus and bilateral adnexa are unremarkable. Other: Persistent trace to small volume free fluid within the lower abdomen and pelvis. No intraperitoneal free gas. Interval decrease in size of a difficult to measure on this noncontrast study 7 x 5.5 x 7cm gas and fluid collection (from 9.2 x 9.8 cm). The collection is again noted to abut pancreas and liver. It extends to adjacent to the lesser curvature of the stomach. Interval placement of pigtail catheter with tip likely terminating within the collection. Musculoskeletal: No abdominal wall hernia or abnormality. No suspicious lytic or blastic osseous lesions. No acute displaced fracture. IMPRESSION: 1. Interval decrease in size of a upper abdominal difficult to measure on this noncontrast study 7 x 5.5 cm gas and fluid collection (from 9.2 x 9.8 cm). Interval placement of a pigtail catheter which likely terminates within the collection. 2. Grossly unremarkable known pancreatic mass is again noted along the proximal pancreas and difficult to measure on this noncontrast study. 3. Common bile duct stent in appropriate position. 4. Persistent trace to small volume ascites within the pelvis. 5. Indeterminate 1.4 cm exophytic right inferior renal pole lesion. Consider nonemergent MRI renal protocol for further evaluation. 6.  Aortic Atherosclerosis (ICD10-I70.0). Electronically Signed   By: Iven Finn M.D.   On: 01/19/2022 15:17   DG CHEST PORT 1 VIEW  Result Date: 01/18/2022 CLINICAL DATA:  Left chest discomfort and dyspnea EXAM: PORTABLE  CHEST 1 VIEW COMPARISON:  01/15/2022 chest radiograph. FINDINGS: Right internal jugular Port-A-Cath terminates at the cavoatrial junction. Stable cardiomediastinal silhouette with normal heart size. No pneumothorax. No pleural effusion. No pulmonary edema. Faint hazy left lung base opacity, similar, favor atelectasis. IMPRESSION: Faint hazy left lung base opacity, favor atelectasis. Electronically Signed   By: Ilona Sorrel M.D.   On: 01/18/2022 08:24   CT IMAGE GUIDED DRAINAGE BY PERCUTANEOUS CATHETER  Result Date: 01/17/2022 CLINICAL DATA:  Pancreatic carcinoma status post biliary stenting. 9.8 cm gas and fluid collection in the porta hepatis EXAM: CT GUIDED DRAINAGE OF ABDOMINAL ABSCESS ANESTHESIA/SEDATION: Intravenous Fentanyl 159mg and Versed '2mg'$  were administered as conscious sedation during continuous monitoring of the patient's level of consciousness and physiological / cardiorespiratory status by the radiology RN, with a total moderate sedation time of 20 minutes. PROCEDURE: The procedure, risks, benefits, and alternatives were explained to the patient. Questions regarding the procedure were encouraged and answered. the patient understands and consents to the procedure. select axial scans through the abdomen were obtained. the collection was localized an appropriate skin entry site was determined and marked. The operative field was prepped with chlorhexidinein a sterile fashion, and a sterile drape was applied covering the operative field. A sterile gown and sterile gloves were used for the procedure. Local anesthesia was provided with 1% Lidocaine. Under CT fluoroscopic guidance, 18 gauge needle was advanced into the  collection. Amplatz guidewire advanced easily. CT confirmed appropriate positioning. Tract dilated with 10 French dilator to facilitate placement of a 10 French pigtail drain catheter, formed centrally within the dependent aspect of the collection. 10 mL of thin purulent material were  aspirated, sent for Gram stain and culture. Catheter secured externally 0 Prolene suture and StatLock and placed to gravity drain bag. The patient tolerated the procedure well. COMPLICATIONS: None immediate FINDINGS: Large gas and fluid collection in the porta hepatis was localized. 10 French drain catheter placed as above. 10 mL of purulent aspirate sent for Gram stain and culture. IMPRESSION: 1. Technically successful subhepatic abscess drain catheter placement with CT guidance. RADIATION DOSE REDUCTION: This exam was performed according to the departmental dose-optimization program which includes automated exposure control, adjustment of the mA and/or kV according to patient size and/or use of iterative reconstruction technique. Electronically Signed   By: Lucrezia Europe M.D.   On: 01/17/2022 07:37   ECHOCARDIOGRAM COMPLETE  Result Date: 01/16/2022    ECHOCARDIOGRAM REPORT   Patient Name:   DANITZA SCHOENFELDT Date of Exam: 01/16/2022 Medical Rec #:  [id number]         Height:       63.5 in Accession #:    [id number]        Weight:       189.0 lb Date of Birth:  March 08, 1956         BSA:          1.899 m Patient Age:    25 years          BP:           128/70 mmHg Patient Gender: F                 HR:           100 bpm. Exam Location:  Inpatient Procedure: 2D Echo, Cardiac Doppler, Color Doppler and Intracardiac            Opacification Agent Indications:    I50.40* Unspecified combined systolic (congestive) and diastolic                 (congestive) heart failure  History:        Patient has no prior history of Echocardiogram examinations.                 Signs/Symptoms:Shortness of Breath, Dyspnea and                 Dizziness/Lightheadedness; Risk Factors:Hypertension, Diabetes                 and Dyslipidemia. Cancer. ETOH.  Sonographer:    Roseanna Rainbow RDCS Referring Phys: [id number] [facility]  Sonographer Comments: Technically difficult study due to poor echo windows. Patient could not tolerate pressure from probe.  Recent drain placed in subcostal region. IMPRESSIONS  1. Left ventricular ejection fraction, by estimation, is 40 to 45%. The left ventricle has mildly decreased function. The left ventricle demonstrates global hypokinesis. Left ventricular diastolic parameters are consistent with Grade I diastolic dysfunction (impaired relaxation). Elevated left ventricular end-diastolic pressure.  2. Right ventricular systolic function is normal. The right ventricular size is normal.  3. The mitral valve is normal in structure. Trivial mitral valve regurgitation. No evidence of mitral stenosis.  4. The aortic valve is tricuspid. Aortic valve regurgitation is not visualized. Mild aortic valve stenosis. Aortic valve area, by VTI measures 1.62 cm. Aortic valve mean gradient measures 11.0 mmHg. Aortic valve  Vmax measures 2.28 m/s.  5. The inferior vena cava is normal in size with greater than 50% respiratory variability, suggesting right atrial pressure of 3 mmHg. FINDINGS  Left Ventricle: Left ventricular ejection fraction, by estimation, is 40 to 45%. The left ventricle has mildly decreased function. The left ventricle demonstrates global hypokinesis. The left ventricular internal cavity size was normal in size. There is  no left ventricular hypertrophy. Left ventricular diastolic parameters are consistent with Grade I diastolic dysfunction (impaired relaxation). Elevated left ventricular end-diastolic pressure. Right Ventricle: The right ventricular size is normal. No increase in right ventricular wall thickness. Right ventricular systolic function is normal. Left Atrium: Left atrial size was normal in size. Right Atrium: Right atrial size was normal in size. Pericardium: There is no evidence of pericardial effusion. Mitral Valve: The mitral valve is normal in structure. Trivial mitral valve regurgitation. No evidence of mitral valve stenosis. MV peak gradient, 11.8 mmHg. The mean mitral valve gradient is 5.0 mmHg. Tricuspid  Valve: The tricuspid valve is normal in structure. Tricuspid valve regurgitation is trivial. No evidence of tricuspid stenosis. Aortic Valve: The aortic valve is tricuspid. Aortic valve regurgitation is not visualized. Mild aortic stenosis is present. Aortic valve mean gradient measures 11.0 mmHg. Aortic valve peak gradient measures 20.7 mmHg. Aortic valve area, by VTI measures 1.62 cm. Pulmonic Valve: The pulmonic valve was normal in structure. Pulmonic valve regurgitation is not visualized. No evidence of pulmonic stenosis. Aorta: The aortic root is normal in size and structure. Venous: The inferior vena cava was not well visualized. The inferior vena cava is normal in size with greater than 50% respiratory variability, suggesting right atrial pressure of 3 mmHg. IAS/Shunts: No atrial level shunt detected by color flow Doppler.  LEFT VENTRICLE PLAX 2D LVIDd:         4.50 cm     Diastology LVIDs:         3.20 cm     LV e' medial:    4.57 cm/s LV PW:         1.00 cm     LV E/e' medial:  18.5 LV IVS:        1.00 cm     LV e' lateral:   5.00 cm/s LVOT diam:     1.70 cm     LV E/e' lateral: 16.9 LV SV:         54 LV SV Index:   28 LVOT Area:     2.27 cm  LV Volumes (MOD) LV vol d, MOD A2C: 90.9 ml LV vol d, MOD A4C: 80.6 ml LV vol s, MOD A2C: 44.1 ml LV vol s, MOD A4C: 51.8 ml LV SV MOD A2C:     46.8 ml LV SV MOD A4C:     80.6 ml LV SV MOD BP:      37.5 ml RIGHT VENTRICLE RV S prime:     16.20 cm/s TAPSE (M-mode): 2.0 cm LEFT ATRIUM             Index        RIGHT ATRIUM          Index LA diam:        3.40 cm 1.79 cm/m   RA Area:     9.83 cm LA Vol (A2C):   29.0 ml 15.27 ml/m  RA Volume:   18.20 ml 9.58 ml/m LA Vol (A4C):   33.1 ml 17.43 ml/m LA Biplane Vol: 31.7 ml 16.69 ml/m  AORTIC VALVE AV Area (  Vmax):    1.63 cm AV Area (Vmean):   1.59 cm AV Area (VTI):     1.62 cm AV Vmax:           227.67 cm/s AV Vmean:          151.667 cm/s AV VTI:            0.330 m AV Peak Grad:      20.7 mmHg AV Mean Grad:       11.0 mmHg LVOT Vmax:         163.00 cm/s LVOT Vmean:        106.000 cm/s LVOT VTI:          0.236 m LVOT/AV VTI ratio: 0.72  AORTA Ao Root diam: 2.60 cm Ao Asc diam:  3.00 cm MITRAL VALVE                TRICUSPID VALVE MV Area (PHT): 5.52 cm     TR Peak grad:   10.0 mmHg MV Area VTI:   1.65 cm     TR Vmax:        158.00 cm/s MV Peak grad:  11.8 mmHg MV Mean grad:  5.0 mmHg     SHUNTS MV Vmax:       1.72 m/s     Systemic VTI:  0.24 m MV Vmean:      96.8 cm/s    Systemic Diam: 1.70 cm MV Decel Time: 137 msec MV E velocity: 84.33 cm/s MV A velocity: 156.33 cm/s MV E/A ratio:  0.54 Skeet Latch MD Electronically signed by Skeet Latch MD Signature Date/Time: 01/16/2022/4:14:37 PM    Final     Labs:  CBC: Recent Labs    01/16/22 0500 01/18/22 0621 01/22/22 0740 01/25/22 1240  WBC 9.9 9.5 10.9* 10.6*  HGB 11.0* 11.1* 11.6* 11.0*  HCT 34.5* 33.7* 35.8* 33.7*  PLT 422* 455* 374 369    COAGS: Recent Labs    01/16/22 1325  INR 3.7*    BMP: Recent Labs    01/17/22 1234 01/18/22 0621 01/22/22 0740 01/25/22 1240  NA 132* 134* 128* 130*  K 3.1* 3.6 3.9 3.4*  CL 100 99 90* 94*  CO2 '24 26 29 26  '$ GLUCOSE 157* 163* 193* 137*  BUN 7* 5* 5* 6*  CALCIUM 7.5* 8.1* 8.4* 8.8*  CREATININE 0.61 0.48 0.56 0.57  GFRNONAA >60 >60 >60 >60    LIVER FUNCTION TESTS: Recent Labs    01/16/22 0500 01/18/22 0621 01/22/22 0740 01/25/22 1240  BILITOT 0.7 0.7 0.6 0.8  AST 39 32 76* 38  ALT 32 28 46* 32  ALKPHOS 103 95 127* 98  PROT 6.5 6.7 7.5 7.8  ALBUMIN 2.0* 2.1* 2.4* 2.6*    TUMOR MARKERS: No results for input(s): "AFPTM", "CEA", "CA199", "CHROMGRNA" in the last 8760 hours.  Assessment:  66 year old woman with history of pancreatic malignancy and hepatic abscess which was treated by percutaneous drain placement on 01/16/2022 returns to IR clinic for follow up.  She has had minimal drain output over the past several days. Her CT scan performed today shows resolution of the abscess.   She denies any fever, chills or abdominal pain.  The drain was removed without difficulty.  The insertion site was covered with a sterile dressing.  I asked her to contact us if she develops pain, fever, or chills.  Electronically Signed: Paula Libra Mckinzy Fuller PA-C 02/14/2022, 4:19 PM

## 2022-02-15 ENCOUNTER — Emergency Department (HOSPITAL_COMMUNITY)
Admission: EM | Admit: 2022-02-15 | Discharge: 2022-02-15 | Disposition: A | Discharge status: Home / Self Care | Payer: BC Managed Care – PPO | Attending: Emergency Medicine | Admitting: Emergency Medicine

## 2022-02-15 ENCOUNTER — Other Ambulatory Visit: Payer: Self-pay

## 2022-02-15 ENCOUNTER — Encounter: Payer: Self-pay | Admitting: *Deleted

## 2022-02-15 ENCOUNTER — Emergency Department (HOSPITAL_COMMUNITY): Payer: BC Managed Care – PPO

## 2022-02-15 DIAGNOSIS — R1010 Upper abdominal pain, unspecified: Secondary | ICD-10-CM | POA: Diagnosis present

## 2022-02-15 DIAGNOSIS — R509 Fever, unspecified: Secondary | ICD-10-CM | POA: Insufficient documentation

## 2022-02-15 DIAGNOSIS — E860 Dehydration: Secondary | ICD-10-CM | POA: Insufficient documentation

## 2022-02-15 DIAGNOSIS — R1084 Generalized abdominal pain: Secondary | ICD-10-CM | POA: Diagnosis not present

## 2022-02-15 DIAGNOSIS — R61 Generalized hyperhidrosis: Secondary | ICD-10-CM | POA: Insufficient documentation

## 2022-02-15 LAB — COMPREHENSIVE METABOLIC PANEL
ALT: 14 U/L (ref 0–44)
AST: 22 U/L (ref 15–41)
Albumin: 3.2 g/dL — ABNORMAL LOW (ref 3.5–5.0)
Alkaline Phosphatase: 71 U/L (ref 38–126)
Anion gap: 11 (ref 5–15)
BUN: 5 mg/dL — ABNORMAL LOW (ref 8–23)
CO2: 26 mmol/L (ref 22–32)
Calcium: 9 mg/dL (ref 8.9–10.3)
Chloride: 97 mmol/L — ABNORMAL LOW (ref 98–111)
Creatinine, Ser: 0.54 mg/dL (ref 0.44–1.00)
GFR, Estimated: >60 mL/min (ref >60)
Glucose, Bld: 108 mg/dL — ABNORMAL HIGH (ref 70–99)
Potassium: 3.2 mmol/L — ABNORMAL LOW (ref 3.5–5.1)
Sodium: 134 mmol/L — ABNORMAL LOW (ref 135–145)
Total Bilirubin: 0.7 mg/dL (ref 0.3–1.2)
Total Protein: 8.2 g/dL — ABNORMAL HIGH (ref 6.5–8.1)

## 2022-02-15 LAB — URINALYSIS, ROUTINE W REFLEX MICROSCOPIC
Bacteria, UA: NONE SEEN
Bilirubin Urine: NEGATIVE
Glucose, UA: NEGATIVE mg/dL
Ketones, ur: NEGATIVE mg/dL
Nitrite: NEGATIVE
Protein, ur: NEGATIVE mg/dL
Specific Gravity, Urine: 1.013 (ref 1.005–1.030)
pH: 7 (ref 5.0–8.0)

## 2022-02-15 LAB — CBC WITH DIFFERENTIAL/PLATELET
Abs Immature Granulocytes: 0.02 10*3/uL (ref 0.00–0.07)
Basophils Absolute: 0.1 10*3/uL (ref 0.0–0.1)
Basophils Relative: 1 %
Eosinophils Absolute: 0.1 10*3/uL (ref 0.0–0.5)
Eosinophils Relative: 1 %
HCT: 36.7 % (ref 36.0–46.0)
Hemoglobin: 12.1 g/dL (ref 12.0–15.0)
Immature Granulocytes: 0 %
Lymphocytes Relative: 18 %
Lymphs Abs: 1.6 10*3/uL (ref 0.7–4.0)
MCH: 28.2 pg (ref 26.0–34.0)
MCHC: 33 g/dL (ref 30.0–36.0)
MCV: 85.5 fL (ref 80.0–100.0)
Monocytes Absolute: 0.5 10*3/uL (ref 0.1–1.0)
Monocytes Relative: 5 %
Neutro Abs: 6.6 10*3/uL (ref 1.7–7.7)
Neutrophils Relative %: 75 %
Platelets: 308 10*3/uL (ref 150–400)
RBC: 4.29 MIL/uL (ref 3.87–5.11)
RDW: 17.3 % — ABNORMAL HIGH (ref 11.5–15.5)
WBC: 8.9 10*3/uL (ref 4.0–10.5)
nRBC: 0 % (ref 0.0–0.2)

## 2022-02-15 LAB — PROTIME-INR
INR: 1.1 (ref 0.8–1.2)
Prothrombin Time: 13.8 seconds (ref 11.4–15.2)

## 2022-02-15 LAB — LACTIC ACID, PLASMA: Lactic Acid, Venous: 1.1 mmol/L (ref 0.5–1.9)

## 2022-02-15 LAB — APTT: aPTT: 37 seconds — ABNORMAL HIGH (ref 24–36)

## 2022-02-15 MED ORDER — LACTATED RINGERS IV BOLUS
500.0000 mL | Freq: Once | INTRAVENOUS | Status: AC
  Administered 2022-02-15: 500 mL via INTRAVENOUS

## 2022-02-15 MED ORDER — HEPARIN SOD (PORK) LOCK FLUSH 100 UNIT/ML IV SOLN
500.0000 [IU] | Freq: Once | INTRAVENOUS | Status: AC
  Administered 2022-02-15: 500 [IU]
  Filled 2022-02-15: qty 5

## 2022-02-15 MED ORDER — HYDROMORPHONE HCL 2 MG PO TABS
4.0000 mg | ORAL_TABLET | Freq: Once | ORAL | Status: AC
  Administered 2022-02-15: 4 mg via ORAL
  Filled 2022-02-15: qty 2

## 2022-02-15 MED ORDER — POTASSIUM CHLORIDE CRYS ER 20 MEQ PO TBCR
40.0000 meq | EXTENDED_RELEASE_TABLET | Freq: Once | ORAL | Status: AC
  Administered 2022-02-15: 40 meq via ORAL

## 2022-02-15 NOTE — ED Provider Notes (Signed)
[facility] DEPT Provider Note   CSN: [id number] Arrival date & time: 02/15/22  0741     History  Chief Complaint  Patient presents with   Abdominal Pain    Melanie Alvarez is a 66 y.o. female.  HPI 66 year old female presents with concern for fever.  She states that she had her hepatic drain removed yesterday.  She is still currently on antibiotics.  She has been having some upper abdominal burning from the site where they removed it since yesterday.  At around 2 AM she woke up and was having sweating and felt feverish.  Put a rag on her head and checked her temperature and she thinks it said 105 though she is now questioning that and is not sure what is said.  Since then she has been feeling okay.  She states she feels dehydrated and has felt so for the past 2 weeks.  She denies any new areas of infection such as urinary tract symptoms, rash, cough.  Home Medications Prior to Admission medications   Medication Sig Start Date End Date Taking? Authorizing Provider  ALPRAZolam Duanne Moron) 1 MG tablet Take 0.5 tablets (0.5 mg total) by mouth every 8 (eight) hours as needed for anxiety. 02/11/22   Volanda Napoleon, MD  aluminum-magnesium hydroxide-simethicone (MAALOX) 086-578-46 MG/5ML SUSP Take 30 mLs by mouth 4 (four) times daily -  before meals and at bedtime. 01/25/22   Sherwood Gambler, MD  amLODipine (NORVASC) 10 MG tablet Take 1 tablet (10 mg total) by mouth at bedtime. 11/27/21   Nche, Charlene Brooke, NP  amoxicillin-clavulanate (AUGMENTIN) 875-125 MG tablet Take 1 tablet by mouth every 12 (twelve) hours. 01/22/22 03/03/22  Charlynne Cousins, MD  APIXABAN Arne Cleveland) VTE STARTER PACK ('10MG'$  AND '5MG'$ ) Take as directed on package: start with two-'5mg'$  tablets twice daily for 7 days. On day 8, switch to one-'5mg'$  tablet twice daily. Patient taking differently: Take 5 mg by mouth 2 (two) times daily. 12/24/21   Volanda Napoleon, MD  atorvastatin (LIPITOR) 20 MG tablet Take 1  tablet (20 mg total) by mouth at bedtime. 01/25/21   Nche, Charlene Brooke, NP  CREON [phone] units CPEP capsule TAKE 2 CAPSULES BY MOUTH THREE TIMES DAILY BEFORE MEAL(S) 02/01/22   Volanda Napoleon, MD  feeding supplement (BOOST HIGH PROTEIN) LIQD Take 2 Containers by mouth in the morning.    [provider]  fentaNYL (DURAGESIC) 25 MCG/HR Place 1 patch onto the skin every 3 (three) days. 01/02/22   Volanda Napoleon, MD  fluticasone (FLONASE) 50 MCG/ACT nasal spray Place 2 sprays into both nostrils daily. Patient taking differently: Place 2 sprays into both nostrils daily as needed for allergies or rhinitis. 05/14/21   Tower, Wynelle Fanny, MD  glipiZIDE (GLUCOTROL XL) 5 MG 24 hr tablet Take 1 tablet (5 mg total) by mouth daily with breakfast. 09/14/21   Nche, Charlene Brooke, NP  glucose blood test strip Test blood glucose once daily. One Touch Verio test strips 09/28/19   Nche, Charlene Brooke, NP  HYDROmorphone (DILAUDID) 4 MG tablet Take 1 tablet (4 mg total) by mouth every 4 (four) hours as needed for severe pain. 02/01/22   Volanda Napoleon, MD  lidocaine-prilocaine (EMLA) cream Apply to affected area once Patient taking differently: 1 application. as needed (for port access). 12/26/21   Volanda Napoleon, MD  metFORMIN (GLUCOPHAGE XR) 750 MG 24 hr tablet Take 1 tablet (750 mg total) by mouth daily with breakfast. 09/14/21  Nche, Charlene Brooke, NP  metoprolol succinate (TOPROL-XL) 25 MG 24 hr tablet TAKE 1 TABLET BY MOUTH ONCE DAILY Patient not taking: Reported on 01/15/2022 01/25/21   Nche, Charlene Brooke, NP  olmesartan (BENICAR) 5 MG tablet Take 1 tablet by mouth once daily 02/11/22   Nche, Charlene Brooke, NP  ondansetron (ZOFRAN) 8 MG tablet Take 1 tablet (8 mg total) by mouth 2 (two) times daily as needed (Nausea or vomiting). Patient taking differently: Take 8 mg by mouth 2 (two) times daily as needed for nausea or vomiting. 12/26/21   Volanda Napoleon, MD  pantoprazole (PROTONIX) 40 MG tablet Take 1  tablet (40 mg total) by mouth 2 (two) times daily for 14 days. Patient not taking: Reported on 01/15/2022 12/04/21 12/18/21  Flossie Buffy, NP  pantoprazole (PROTONIX) 40 MG tablet Take 1 tablet (40 mg total) by mouth daily. 12/24/21   Volanda Napoleon, MD  potassium chloride (KLOR-CON M) 20 MEQ tablet Take 1 tablet (20 mEq total) by mouth 2 (two) times daily. Patient not taking: Reported on 01/15/2022 11/27/21   Nche, Charlene Brooke, NP  prochlorperazine (COMPAZINE) 10 MG tablet Take 1 tablet (10 mg total) by mouth every 6 (six) hours as needed (Nausea or vomiting). Patient taking differently: Take 10 mg by mouth every 6 (six) hours as needed for nausea or vomiting. 12/26/21   Volanda Napoleon, MD  TYLENOL 8 HOUR 650 MG CR tablet Take 650-1,300 mg by mouth every 8 (eight) hours as needed for pain.    [provider]      Allergies    Lisinopril    Review of Systems   Review of Systems  Constitutional:  Positive for diaphoresis and fever.  Respiratory:  Positive for shortness of breath (chronic, unchanged). Negative for cough.   Gastrointestinal:  Positive for abdominal pain (lower abd pain for several weeks, upper burning since yesterday) and diarrhea (one episode this morning).  Genitourinary:  Negative for dysuria.    Physical Exam Updated Vital Signs BP 122/79 Comment: Simultaneous filing. User may not have seen previous data.  Pulse 97 Comment: Simultaneous filing. User may not have seen previous data.  Temp 98.3 F (36.8 C) (Oral)   Resp 17 Comment: Simultaneous filing. User may not have seen previous data.  Ht '5\' 3"'$  (1.6 m)   Wt 85.7 kg   SpO2 98% Comment: Simultaneous filing. User may not have seen previous data.  BMI 33.48 kg/m  Physical Exam Vitals and nursing note reviewed.  Constitutional:      General: She is not in acute distress.    Appearance: She is well-developed. She is not ill-appearing or diaphoretic.  HENT:     Head: Normocephalic and atraumatic.   Cardiovascular:     Rate and Rhythm: Normal rate and regular rhythm.     Heart sounds: Normal heart sounds.  Pulmonary:     Effort: Pulmonary effort is normal.     Breath sounds: Normal breath sounds. No wheezing or rales.  Abdominal:     Palpations: Abdomen is soft.     Tenderness: There is abdominal tenderness in the right lower quadrant, epigastric area and left lower quadrant.  Skin:    General: Skin is warm and dry.  Neurological:     Mental Status: She is alert.     ED Results / Procedures / Treatments   Labs (all labs ordered are listed, but only abnormal results are displayed) Labs Reviewed  COMPREHENSIVE METABOLIC PANEL - Abnormal; Notable for  the following components:      Result Value   Sodium 134 (*)    Potassium 3.2 (*)    Chloride 97 (*)    Glucose, Bld 108 (*)    BUN 5 (*)    Total Protein 8.2 (*)    Albumin 3.2 (*)    All other components within normal limits  CBC WITH DIFFERENTIAL/PLATELET - Abnormal; Notable for the following components:   RDW 17.3 (*)    All other components within normal limits  APTT - Abnormal; Notable for the following components:   aPTT 37 (*)    All other components within normal limits  URINALYSIS, ROUTINE W REFLEX MICROSCOPIC - Abnormal; Notable for the following components:   Hgb urine dipstick SMALL (*)    Leukocytes,Ua SMALL (*)    All other components within normal limits  CULTURE, BLOOD (ROUTINE X 2)  CULTURE, BLOOD (ROUTINE X 2)  URINE CULTURE  LACTIC ACID, PLASMA  PROTIME-INR    EKG EKG Interpretation  Date/Time:  Friday February 15 2022 08:18:01 EDT Ventricular Rate:  91 PR Interval:  144 QRS Duration: 104 QT Interval:  340 QTC Calculation: 419 R Axis:   31 Text Interpretation: Sinus rhythm no significant change since Jan 25 2022 Confirmed by Sherwood Gambler [phone]) on 02/15/2022 9:54:44 AM  Radiology DG Chest Port 1 View  Result Date: 02/15/2022 CLINICAL DATA:  Fever.  Concern for sepsis. EXAM: PORTABLE  CHEST 1 VIEW COMPARISON:  01/18/2022; chest CT-01/15/2022 FINDINGS: Normal cardiac silhouette and mediastinal contours. Atherosclerotic plaque within the thoracic aorta. Stable positioning of support apparatus. No focal airspace opacities. No pleural effusion or pneumothorax. No evidence of edema. No acute osseous abnormalities. Degenerative change of the bilateral glenohumeral joints, incompletely evaluated. IMPRESSION: No acute cardiopulmonary disease. Specifically, no evidence of pneumonia. Electronically Signed   By: Sandi Mariscal M.D.   On: 02/15/2022 08:33   IR Radiologist Eval & Mgmt  Result Date: 02/15/2022 Please refer to notes tab for details about interventional procedure. (Op Note)  CT ABDOMEN PELVIS W CONTRAST  Result Date: 02/14/2022 CLINICAL DATA:  66 year old woman with history pancreatic malignancy and hepatic abscess status post drain placement 01/16/2022 returns to IR for drain evaluation. EXAM: CT ABDOMEN AND PELVIS WITH CONTRAST TECHNIQUE: Multidetector CT imaging of the abdomen and pelvis was performed using the standard protocol following bolus administration of intravenous contrast. RADIATION DOSE REDUCTION: This exam was performed according to the departmental dose-optimization program which includes automated exposure control, adjustment of the mA and/or kV according to patient size and/or use of iterative reconstruction technique. CONTRAST:  170m ISOVUE-300 IOPAMIDOL (ISOVUE-300) INJECTION 61% COMPARISON:  CT abdomen pelvis with contrast 01/30/2022 FINDINGS: Lower chest: No acute abnormality. Prominent subpleural fat again seen in the left posterolateral chest. Hepatobiliary: Pneumobilia and mild intrahepatic ductal dilatation consistent with prior sphincterotomy and metallic common bile duct stent placement. There has been interval resolution of previously seen hepatic abscess. Liver abscess drain is in appropriate position. Pancreas: Pancreatic head mass is unchanged. Unchanged  dilatation main pancreatic duct measuring up to 14 mm. Spleen: Normal in size without focal abnormality. Adrenals/Urinary Tract: Adrenal glands: Normal Kidneys: 1.4 cm fat containing lesion in the lower pole of the right kidney is not significantly changed in size dating back to 05/25/2020 and is consistent with a benign angiomyolipoma. Kidneys otherwise normal. Ureters: Normal Bladder: Normal Stomach/Bowel: Stomach is within normal limits. Appendix appears normal. No evidence of bowel wall thickening, distention, or inflammatory changes. Vascular/Lymphatic: Atherosclerotic calcifications seen scattered throughout the  abdominal aorta. No enlarged abdominal or pelvic lymph nodes. Reproductive: Uterus and bilateral adnexa are unremarkable. Other: No abdominal wall hernia or abnormality. No abdominopelvic ascites. Musculoskeletal: No acute or significant osseous findings. IMPRESSION: 1. Interval resolution of hepatic abscess. Hepatic drain is in appropriate position. 2. Unchanged pancreatic head mass and dilated pancreatic duct. Electronically Signed   By: Miachel Roux M.D.   On: 02/14/2022 13:15    Procedures Procedures    Medications Ordered in ED Medications  lactated ringers bolus 500 mL (0 mLs Intravenous Stopped 02/15/22 1248)  potassium chloride SA (KLOR-CON M) CR tablet 40 mEq (40 mEq Oral Given 02/15/22 1003)  HYDROmorphone (DILAUDID) tablet 4 mg (4 mg Oral Given 02/15/22 1257)  heparin lock flush 100 unit/mL (500 Units Intracatheter Given 02/15/22 1258)    ED Course/ Medical Decision Making/ A&P                           Medical Decision Making Amount and/or Complexity of Data Reviewed External Data Reviewed: radiology and notes.    Details: Just had CT abdomen/pelvis that did not show any abscess Labs: ordered.    Details: WBC normal.  Mild hypokalemia.  Other electrolytes near baseline. Radiology: independent interpretation performed.    Details: Chest x-ray without pneumonia on my  view ECG/medicine tests: independent interpretation performed.    Details: No acute ischemia  Risk Prescription drug management.   No fever here.  Vital signs are normal.  WBC is normal.  I question whether she truly had a fever this morning.  Patient is now suspecting that she might of had some anxiety last night.  Other labs are unremarkable and her urinalysis is not consistent with UTI.  She has some tenderness to her abdomen but this has been present for quite some time and she just had the CT yesterday.  I did offer a repeat CT though I do think this would probably be low yield.  However after discussion, patient would like to hold off which I think is reasonable.  Chest x-ray without pneumonia.  At this point, she was given some of her oral chronic pain medicine and I think she is stable for discharge home.  Blood cultures were sent from triage.  Otherwise she appears stable for discharge home.  Given return precautions.        Final Clinical Impression(s) / ED Diagnoses Final diagnoses:  Generalized abdominal pain    Rx / DC Orders ED Discharge Orders     None         Sherwood Gambler, MD 02/15/22 1326

## 2022-02-15 NOTE — Discharge Instructions (Addendum)
If you develop worsening, continued, or recurrent abdominal pain, uncontrolled vomiting, fever, chest or back pain, or any other new/concerning symptoms then return to the ER for evaluation.  

## 2022-02-15 NOTE — ED Notes (Signed)
Pt is aware urine sample is needed. Pt attempted to provide sample and was only able to defecate with no urinary output. Pt will attempt to provide urine sample at a later time. Pt is requesting fluids because she feels dehydrated.

## 2022-02-15 NOTE — ED Provider Triage Note (Signed)
Emergency Medicine Provider Triage Evaluation Note  Melanie Alvarez , a 66 y.o. female  was evaluated in triage.  Pt complains of fever.  Patient has history of stage III pancreatic cancer with recent drain placed and removed yesterday.  She is currently on active chemotherapy.  States that last night she woke up around 3 AM diaphoretic and feeling like she had a fever.  She states that it was 105.  I did double check that this was not 100.5 and she is unsure.  She denies taking any medications for this.  States that she ate some ice chips and presented to the emergency department given that she had the drain removed yesterday and was told to come to the emergency department for any complications.  She denies any abdominal pain.  States that she also had diarrhea this morning and has had nausea without vomiting.  Review of Systems  Positive:  Negative:   Physical Exam  BP 130/77 (BP Location: Left Arm)   Pulse 93   Temp 98.3 F (36.8 C) (Oral)   Resp 18   Ht '5\' 3"'$  (1.6 m)   Wt 85.7 kg   SpO2 99%   BMI 33.48 kg/m  Gen:   Awake, no distress   Resp:  Normal effort  MSK:   Moves extremities without difficulty  Other:  Abdomen is rounded, drainage site is normal without purulent drainage or erythema.  Medical Decision Making  Medically screening exam initiated at 8:12 AM.  Appropriate orders placed.  Melanie Alvarez was informed that the remainder of the evaluation will be completed by another provider, this initial triage assessment does not replace that evaluation, and the importance of remaining in the ED until their evaluation is complete.  Sepsis work-up initiated given immunocompromise status.   Mickie Hillier, PA-C 02/15/22 [phone]

## 2022-02-15 NOTE — ED Triage Notes (Signed)
Pt arrived via POV, c/o fever and abd pain. States fever was 105 this morning. Nausea, no vomiting. States she had percutaneous drain removed yesterday and is concerned about complications.

## 2022-02-17 LAB — URINE CULTURE: Culture: >=100000 — AB

## 2022-02-18 ENCOUNTER — Telehealth: Payer: Self-pay | Admitting: Emergency Medicine

## 2022-02-18 NOTE — Telephone Encounter (Signed)
Post ED Visit - Positive Culture Follow-up: Successful Patient Follow-Up  Culture assessed and recommendations reviewed by:  '[]'$  Elenor Quinones, Pharm.D. '[]'$  Heide Guile, Pharm.D., BCPS AQ-ID '[]'$  Parks Neptune, Pharm.D., BCPS '[]'$  Alycia Rossetti, Pharm.D., BCPS '[]'$  [place], Pharm.D., BCPS, AAHIVP '[]'$  Legrand Como, Pharm.D., BCPS, AAHIVP '[]'$  Salome Arnt, PharmD, BCPS '[]'$  Johnnette Gourd, PharmD, BCPS '[]'$  Hughes Better, PharmD, BCPS '[]'$  Leeroy Cha, PharmD  Positive urine culture  '[x]'$  Patient discharged without antimicrobial prescription and treatment is now indicated '[]'$  Organism is resistant to prescribed ED discharge antimicrobial '[]'$  Patient with positive blood cultures  Changes discussed with ED provider: Sharyn Lull [facility] New antibiotic prescription start bactrim DS 1 tab twice daily x 5 days Called to [place]  Contacted patient   Hazle Nordmann 02/18/2022, 10:47 AM

## 2022-02-18 NOTE — Progress Notes (Signed)
ED Antimicrobial Stewardship Positive Culture Follow Up   Melanie Alvarez is an 66 y.o. female who presented to [facility] on 02/15/2022 with a chief complaint of  Chief Complaint  Patient presents with   Abdominal Pain    Recent Results (from the past 720 hour(s))  Blood Culture (routine x 2)     Status: None (Preliminary result)   Collection Time: 02/15/22  8:12 AM   Specimen: BLOOD  Result Value Ref Range Status   Specimen Description   Final    BLOOD BLOOD LEFT FOREARM Performed at [facility], [place] [address], [place], [place] [address]    Special Requests   Final    BOTTLES DRAWN AEROBIC AND ANAEROBIC Blood Culture results may not be optimal due to an excessive volume of blood received in culture bottles Performed at [facility] [address], [place], [place] [address]    Culture   Final    NO GROWTH 2 DAYS Performed at [facility] [address] [place], [place] [address]    Report Status PENDING  Incomplete  Blood Culture (routine x 2)     Status: None (Preliminary result)   Collection Time: 02/15/22  8:17 AM   Specimen: BLOOD  Result Value Ref Range Status   Specimen Description   Final    BLOOD BLOOD RIGHT FOREARM Performed at [facility] [address] [place], [place] [address]    Special Requests   Final    BOTTLES DRAWN AEROBIC AND ANAEROBIC Blood Culture results may not be optimal due to an excessive volume of blood received in culture bottles Performed at [facility] [address], [place], [place] [address]    Culture   Final    NO GROWTH 2 DAYS Performed at [facility] [address] [place], [place] [address]    Report Status PENDING  Incomplete  Urine Culture     Status: Abnormal   Collection Time: 02/15/22 11:00 AM   Specimen: In/Out Cath Urine  Result Value Ref Range Status   Specimen Description   Final    IN/OUT CATH URINE Performed at  [facility] [address], [place], [place] [address]    Special Requests   Final    Immunocompromised Performed at [facility], [place] [address], [place],  [address]    Culture >=100,000 COLONIES/mL ENTEROBACTER AEROGENES (A)  Final   Report Status 02/17/2022 FINAL  Final   Organism ID, Bacteria ENTEROBACTER AEROGENES (A)  Final      Susceptibility   Enterobacter aerogenes - MIC*    CEFAZOLIN RESISTANT Resistant     CEFEPIME <=0.12 SENSITIVE Sensitive     CEFTRIAXONE <=0.25 SENSITIVE Sensitive     CIPROFLOXACIN <=0.25 SENSITIVE Sensitive     GENTAMICIN <=1 SENSITIVE Sensitive     IMIPENEM 1 SENSITIVE Sensitive     NITROFURANTOIN 64 INTERMEDIATE Intermediate     TRIMETH/SULFA <=20 SENSITIVE Sensitive     PIP/TAZO <=4 SENSITIVE Sensitive     * >=100,000 COLONIES/mL ENTEROBACTER AEROGENES    '[x]'$ Patient discharged originally without antimicrobial agent and treatment is now indicated   Patient appeared in the ED on 02/15/22 complaining of general abdomen pain and said she had been running a fever. No fever was noted in the ED and she reported no dysuria, urinary frequency, or flank pain.On 6/29 the patient's hepatic drain was removed. Patient has hx of hepatic abscess, though last  CT on 02/14/22 noted interval resolution thereof. Abscess is currently treated w/ Augmentin 875-125 mg Q12H; the abscess culture yielded E. Coli and streptococcus.  Has a hx of pancreatic cancer for which her last chemo treatment was 12/24/21. The patient's urinalysis (taken via cath insertion) showed no signs unambiguously attributable to UTI (nitrite negative, small leukocyte load, 6-10 WBC) and contained 0-5 epithelial cells , but the patient's urinary Cx grew >= 100,000 colonies/mL of Enterobacter aerogenes w/ resistance to Macrobid, and 1st gen cephalosporins. Her current Augmentin regimen is not suited to treat this bacterium.   Spoke to provider and it was agreed that  d/t patient's hepatic abscess hx and immunocompromised status she should be treated in spite of her lack of UTI Sx. Ciprofloxacin will be avoided d/t adverse effects so Bactrim DS will be utilized to treat bacteria recorded on urine Cx.  New antibiotic prescription: Bactrim DS (800-160 mg) tabs BID X 5 days  ED Provider: Minus Liberty 02/18/2022, 9:32 AM Clinical Pharmacist Monday - Friday phone -  [phone] Saturday - Sunday phone - [phone]

## 2022-02-20 ENCOUNTER — Inpatient Hospital Stay: Payer: BC Managed Care – PPO | Admitting: Internal Medicine

## 2022-02-20 LAB — CULTURE, BLOOD (ROUTINE X 2)
Culture: NO GROWTH
Culture: NO GROWTH

## 2022-02-26 ENCOUNTER — Inpatient Hospital Stay: Payer: BC Managed Care – PPO | Admitting: Nurse Practitioner

## 2022-02-26 ENCOUNTER — Telehealth: Payer: Self-pay | Admitting: Nurse Practitioner

## 2022-02-26 NOTE — Telephone Encounter (Signed)
Pt was a no show 02/26/2022 for an HFU appt with Dr. Lorayne Marek, I have sent out a no show letter

## 2022-02-26 NOTE — Progress Notes (Signed)
03/04/2022 Melanie Alvarez [id number] 12/05/1955  Referring provider: Flossie Buffy, NP Primary GI doctor: Dr. Rush Landmark (Dr. Paulita Fujita)  ASSESSMENT AND PLAN:   Malignant neoplasm of body of pancreas [facility]) Patient recently changed from every 14-day cycle to every 28-day Abraxane/gemcitabine due to disease progression seen on CT scan 12/13/2021.   Labs normal, no signs of obstruction Suggested MOST forms/living will to patient, will discussion with PCP/cancer center Vomiting and diarrhea can be from cancer progression/chemo as well, last chemo was 05/08, likely multifactorial.  -     CBC with Differential/Platelet; Future -     Comprehensive metabolic panel; Future -     sucralfate (CARAFATE) 1 g tablet; Take 1 tablet (1 g total) by mouth 2 (two) times daily.  Bilious vomiting with nausea -     ondansetron (ZOFRAN) 8 MG tablet; Take 1 tablet (8 mg total) by mouth 2 (two) times daily as needed (Nausea or vomiting). Had gastritis/doudenitis on last EGD Will refill zofran, increase PPI BID, add on carafate Could be gastroparesis, given diet information, consider GES Can consider EGD with Dr. Rush Landmark pending labs and response to medication, no evidence of obstruction on last CT Will need to be off Eliquis for 2 days prior Patient and husband's goal is to get to the beach again this year.   Worsening diarrhea with periumbilical pain, history of Pancreatic insufficiency, has had several ABX recently for UTI's 02/14/2022 CT abdomen pelvis with contrast for hepatic abscess status post drain placement 05/31 Can be from EPI, continue creon Hold metformin for the time being to see if this helps symptoms, sugars have been well controlled per patient.  Possible infection with recent ABX, will check labs for Cdiff -     GI Profile, Stool, PCR; Future -     Clostridium difficile Toxin B, Qualitative, Real-Time PCR; Future  Portal vein thrombosis and mesenteric thrombosis Patient has  been off eliquis x 2 weeks due to running out of prescription Likely needs to continue with thrombosis history and cancer, no leg swelling, chest pain, worsening SOB, ER precautions discussed with patient.  Will send in 1 prescription, and patient will follow up with cancer center -     apixaban (ELIQUIS) 5 MG TABS tablet; Take 1 tablet (5 mg total) by mouth 2 (two) times daily.  Cirrhosis of liver without ascites, unspecified hepatic cirrhosis type ([name]) -     Protime-INR; Future No AB swelling, leg swelling.  MELD-Na: 7 at 02/15/2022  No signs of decompensation, no signs of HE  History of Present Illness:  66 y.o. female with a past medical history of hypertension, type 2 diabetes, alcohol abuse, chronic hepatitis C eradicated with Harvoni with cirrhosis x2019, pancreatic cancer diagnosed 2021 after EUS with Dr. Rush Landmark for jaundice and others listed below, presents for hospital and ER follow up.   01/19/2020 EUS with Dr. Rush Landmark EGD findings no gross lesions in esophagus, Endo Clip in stomach, gastritis, duodenitis, metal biliary stent in the duodenum This findings showed a mass was identified in pancreatic head biopsy that showed pancreatic cancer Since that time patient's been following with Dr. Marin Olp, has been on chemotherapy with last treatment being 12/24/2021.   Patient recently changed from every 14-day cycle to every 28-day Abraxane/gemcitabine due to disease progression seen on CT scan 12/13/2021.   Subsequent portal vein and mesenteric thrombosis on Eliquis started 12/24/2021, has been off eliquis x 2 weeks,  patient has not had any so did not stop.  She also  has pancreatic insufficiency, chronic pain. 01/15/2022 admitted for fever and abdominal pain, found to have hepatic abscess on CT scan 01/19/2022, IR was consulted had pigtail drain 01/16/2022. 01/30/2022 follow-up with IR who scheduled repositioning of the drain 01/31/2022 02/15/2019 3 repeat CT showed resolution of  abscess, drain was removed by IR 02/15/2022 ER at [facility] for fever and upper abdominal burning Labs showed BUN 5 albumin 3.2, potassium 3.2, unremarkable lactic acid and blood cultures lipase normal chest x-ray unremarkable.  Urinalysis did show Enterobacter aerogenes started on Bactrim twice daily for 5 days.  Patient has been having lower AB pain since 06/06.  Periumbilical pain, has been having diarrhea and dehydration. She has loose stools 2-3 x a day, she has some fever at night, no chills. Denies blood in the stool.   She has lost about 30 lbs She has a lot of nausea, vomiting and has a lot of belching  She vomits 2-3 x a day, dark green, no food particles or CGE/blood.  She has dizziness. She has SOB and chest pain.  She has intermittent chest pain, denies pain with exertion, it is substernal/epigastric pain.  She states she has a lot of anxiety and cries a lot.  She denies swelling in her legs, swelling in AB.   Husband is here and she would like to go to the beach one more time.  Discussed some quality care and MOST forms with patient.   02/14/2022 CT abdomen pelvis with contrast for hepatic abscess status post drain placement 05/31   CBC  02/15/2022  HGB 12.1 MCV 85.5 without evidence of anemia WBC 8.9 Platelets 308 Kidney function 02/15/2022  BUN 5 Cr 0.54  GFR >60  Potassium 3.2   LFTs 02/15/2022  AST 22 ALT 14 Alkphos 71 TBili 0.7 01/25/2022 LIPASE 24 05/25/2019 TSH 2.90  Current Medications:   Current Outpatient Medications (Endocrine & Metabolic):    glipiZIDE (GLUCOTROL XL) 5 MG 24 hr tablet, Take 1 tablet (5 mg total) by mouth daily with breakfast.   metFORMIN (GLUCOPHAGE XR) 750 MG 24 hr tablet, Take 1 tablet (750 mg total) by mouth daily with breakfast.  Current Outpatient Medications (Cardiovascular):    amLODipine (NORVASC) 10 MG tablet, Take 1 tablet (10 mg total) by mouth at bedtime.   atorvastatin (LIPITOR) 20 MG tablet, Take 1 tablet (20 mg total) by  mouth at bedtime.   olmesartan (BENICAR) 5 MG tablet, Take 1 tablet by mouth once daily   metoprolol succinate (TOPROL-XL) 25 MG 24 hr tablet, TAKE 1 TABLET BY MOUTH ONCE DAILY (Patient not taking: Reported on 03/04/2022)  Current Outpatient Medications (Respiratory):    fluticasone (FLONASE) 50 MCG/ACT nasal spray, Place 2 sprays into both nostrils daily. (Patient not taking: Reported on 03/04/2022)  Current Outpatient Medications (Analgesics):    HYDROmorphone (DILAUDID) 4 MG tablet, Take 1 tablet (4 mg total) by mouth every 4 (four) hours as needed for severe pain.   fentaNYL (DURAGESIC) 25 MCG/HR, Place 1 patch onto the skin every 3 (three) days. (Patient not taking: Reported on 03/04/2022)   TYLENOL 8 HOUR 650 MG CR tablet, Take 650-1,300 mg by mouth every 8 (eight) hours as needed for pain. (Patient not taking: Reported on 03/04/2022)  Current Outpatient Medications (Hematological):    apixaban (ELIQUIS) 5 MG TABS tablet, Take 1 tablet (5 mg total) by mouth 2 (two) times daily.  Current Outpatient Medications (Other):    ALPRAZolam (XANAX) 1 MG tablet, Take 0.5 tablets (0.5 mg total) by mouth every  8 (eight) hours as needed for anxiety.   Amylase-Lipase-Protease (CREON 10 PO), Take 1 tablet by mouth daily.   NON FORMULARY, Take 1 tablet by mouth daily.   sucralfate (CARAFATE) 1 g tablet, Take 1 tablet (1 g total) by mouth 2 (two) times daily.   aluminum-magnesium hydroxide-simethicone (MAALOX) 706-237-62 MG/5ML SUSP, Take 30 mLs by mouth 4 (four) times daily -  before meals and at bedtime. (Patient not taking: Reported on 03/04/2022)   CREON 36000-114000 units CPEP capsule, TAKE 2 CAPSULES BY MOUTH THREE TIMES DAILY BEFORE MEAL(S) (Patient not taking: Reported on 03/04/2022)   feeding supplement (BOOST HIGH PROTEIN) LIQD, Take 2 Containers by mouth in the morning. (Patient not taking: Reported on 03/04/2022)   glucose blood test strip, Test blood glucose once daily. One Touch Verio test strips  (Patient not taking: Reported on 03/04/2022)   lidocaine-prilocaine (EMLA) cream, Apply to affected area once (Patient not taking: Reported on 03/04/2022)   ondansetron (ZOFRAN) 8 MG tablet, Take 1 tablet (8 mg total) by mouth 2 (two) times daily as needed (Nausea or vomiting).   pantoprazole (PROTONIX) 40 MG tablet, Take 1 tablet (40 mg total) by mouth 2 (two) times daily for 14 days. (Patient not taking: Reported on 01/15/2022)   pantoprazole (PROTONIX) 40 MG tablet, Take 1 tablet (40 mg total) by mouth daily. (Patient not taking: Reported on 03/04/2022)   potassium chloride (KLOR-CON M) 20 MEQ tablet, Take 1 tablet (20 mEq total) by mouth 2 (two) times daily. (Patient not taking: Reported on 01/15/2022)   prochlorperazine (COMPAZINE) 10 MG tablet, Take 1 tablet (10 mg total) by mouth every 6 (six) hours as needed (Nausea or vomiting). (Patient not taking: Reported on 03/04/2022)  Surgical History:  She  has a past surgical history that includes Dental Surgery Tooth Extraction; Colonoscopy (07/24/2011); ERCP (N/A, 09/22/2019); EUS (N/A, 09/22/2019); sphincterotomy (09/22/2019); Biliary dilation (09/22/2019); Fine needle aspiration (09/22/2019); biliary stent placement (N/A, 09/22/2019); Biliary brushing (09/22/2019); Esophagogastroduodenoscopy (egd) with propofol (N/A, 09/22/2019); IR IMAGING GUIDED PORT INSERTION (09/24/2019); Upper esophageal endoscopic ultrasound (eus) (N/A, 09/25/2019); Fine needle aspiration (09/25/2019); Hemostasis clip placement (09/25/2019); Esophagogastroduodenoscopy (egd) with propofol (N/A, 09/25/2019); EUS (N/A, 01/19/2020); Fiducial marker placement (N/A, 01/19/2020); Esophagogastroduodenoscopy (egd) with propofol (N/A, 01/19/2020); IR REMOVAL TUN ACCESS W/ PORT W/O FL MOD SED (06/16/2020); IR IMAGING GUIDED PORT INSERTION (12/31/2021); IR Radiologist Eval & Mgmt (01/30/2022); IR Catheter Tube Change (01/31/2022); IR CHOLANGIOGRAM EXISTING TUBE (02/11/2022); and IR Radiologist Eval & Mgmt (02/14/2022). Family History:   Her family history includes Cancer in her maternal grandmother; Diabetes in her maternal grandfather, paternal grandfather, paternal grandmother, and sister; Hypertension in her brother and sister. Social History:   reports that she quit smoking about 2 years ago. Her smoking use included cigarettes. She smoked an average of .25 packs per day. She has never used smokeless tobacco. She reports that she does not currently use alcohol. She reports that she does not use drugs.  Current Medications, Allergies, Past Medical History, Past Surgical History, Family History and Social History were reviewed in [employer] record.  Physical Exam: BP 132/72   Pulse 93   Ht '5\' 3"'$  (1.6 m)   Wt 150 lb (68 kg)   BMI 26.57 kg/m  General:   AAF, no acute distress Heart : Regular rate and rhythm; no murmurs Pulm: Clear anteriorly; no wheezing Abdomen:  Soft, Obese AB, Hyperactive, tinkling RUQ, but active throughout. moderate tenderness in the epigastrium and in the lower abdomen. Without guarding and Without rebound,  No organomegaly appreciated. Rectal: Not evaluated Extremities:  without  edema. Neurologic:  Alert and  oriented x4;  No focal deficits.  Psych:  Cooperative. Normal mood and affect.   Vladimir Crofts, PA-C 03/04/22

## 2022-02-28 ENCOUNTER — Telehealth: Payer: Self-pay | Admitting: Nurse Practitioner

## 2022-02-28 ENCOUNTER — Telehealth: Payer: Self-pay | Admitting: *Deleted

## 2022-02-28 NOTE — Telephone Encounter (Signed)
Message received from patient requesting a refill of pill for "dizziness."  Call placed back to patient and patient notified to contact her PCP regarding pill for dizziness and that she will need an appt to see Dr. Marin Olp next week.  Message sent to scheduling.

## 2022-02-28 NOTE — Telephone Encounter (Signed)
Pt stated that she need a refill on dizzy pain medication. Pt stated that she don't no the name of the medication

## 2022-02-28 NOTE — Telephone Encounter (Signed)
Last OV: 11/2021 Next OV: not scheduled  Which medication was prescribed for dizziness/Pain?

## 2022-03-01 NOTE — Telephone Encounter (Signed)
Pt declined appt

## 2022-03-01 NOTE — Telephone Encounter (Signed)
Left VM, adv pt to call back to schedule an appt.

## 2022-03-04 ENCOUNTER — Telehealth: Payer: Self-pay | Admitting: Physician Assistant

## 2022-03-04 ENCOUNTER — Ambulatory Visit: Payer: BC Managed Care – PPO | Admitting: Physician Assistant

## 2022-03-04 ENCOUNTER — Encounter: Payer: Self-pay | Admitting: Hematology & Oncology

## 2022-03-04 ENCOUNTER — Encounter (HOSPITAL_COMMUNITY): Payer: Self-pay | Admitting: *Deleted

## 2022-03-04 ENCOUNTER — Other Ambulatory Visit (INDEPENDENT_AMBULATORY_CARE_PROVIDER_SITE_OTHER): Payer: BC Managed Care – PPO

## 2022-03-04 ENCOUNTER — Encounter: Payer: Self-pay | Admitting: Physician Assistant

## 2022-03-04 ENCOUNTER — Other Ambulatory Visit: Payer: Self-pay

## 2022-03-04 ENCOUNTER — Emergency Department (HOSPITAL_COMMUNITY): Payer: BC Managed Care – PPO

## 2022-03-04 ENCOUNTER — Inpatient Hospital Stay (HOSPITAL_COMMUNITY)
Admission: EM | Admit: 2022-03-04 | Discharge: 2022-03-24 | DRG: 435 | Disposition: A | Discharge status: Home / Self Care | Payer: BC Managed Care – PPO | Source: Ambulatory Visit | Attending: Internal Medicine | Admitting: Internal Medicine

## 2022-03-04 ENCOUNTER — Other Ambulatory Visit: Payer: Self-pay | Admitting: *Deleted

## 2022-03-04 VITALS — BP 132/72 | HR 93 | Ht 63.0 in | Wt 150.0 lb

## 2022-03-04 DIAGNOSIS — Z79899 Other long term (current) drug therapy: Secondary | ICD-10-CM

## 2022-03-04 DIAGNOSIS — K7460 Unspecified cirrhosis of liver: Secondary | ICD-10-CM | POA: Diagnosis present

## 2022-03-04 DIAGNOSIS — C251 Malignant neoplasm of body of pancreas: Secondary | ICD-10-CM

## 2022-03-04 DIAGNOSIS — Z888 Allergy status to other drugs, medicaments and biological substances status: Secondary | ICD-10-CM

## 2022-03-04 DIAGNOSIS — I959 Hypotension, unspecified: Secondary | ICD-10-CM | POA: Diagnosis present

## 2022-03-04 DIAGNOSIS — Z86718 Personal history of other venous thrombosis and embolism: Secondary | ICD-10-CM

## 2022-03-04 DIAGNOSIS — R1115 Cyclical vomiting syndrome unrelated to migraine: Secondary | ICD-10-CM | POA: Diagnosis present

## 2022-03-04 DIAGNOSIS — Z8249 Family history of ischemic heart disease and other diseases of the circulatory system: Secondary | ICD-10-CM

## 2022-03-04 DIAGNOSIS — R197 Diarrhea, unspecified: Secondary | ICD-10-CM

## 2022-03-04 DIAGNOSIS — K31819 Angiodysplasia of stomach and duodenum without bleeding: Secondary | ICD-10-CM | POA: Diagnosis present

## 2022-03-04 DIAGNOSIS — C250 Malignant neoplasm of head of pancreas: Secondary | ICD-10-CM

## 2022-03-04 DIAGNOSIS — I82890 Acute embolism and thrombosis of other specified veins: Secondary | ICD-10-CM | POA: Diagnosis not present

## 2022-03-04 DIAGNOSIS — K8689 Other specified diseases of pancreas: Secondary | ICD-10-CM

## 2022-03-04 DIAGNOSIS — E871 Hypo-osmolality and hyponatremia: Secondary | ICD-10-CM | POA: Diagnosis present

## 2022-03-04 DIAGNOSIS — I10 Essential (primary) hypertension: Secondary | ICD-10-CM | POA: Diagnosis not present

## 2022-03-04 DIAGNOSIS — I82210 Acute embolism and thrombosis of superior vena cava: Secondary | ICD-10-CM | POA: Diagnosis present

## 2022-03-04 DIAGNOSIS — G893 Neoplasm related pain (acute) (chronic): Secondary | ICD-10-CM | POA: Diagnosis present

## 2022-03-04 DIAGNOSIS — R1013 Epigastric pain: Secondary | ICD-10-CM

## 2022-03-04 DIAGNOSIS — K838 Other specified diseases of biliary tract: Secondary | ICD-10-CM | POA: Diagnosis present

## 2022-03-04 DIAGNOSIS — K315 Obstruction of duodenum: Secondary | ICD-10-CM | POA: Diagnosis present

## 2022-03-04 DIAGNOSIS — Z833 Family history of diabetes mellitus: Secondary | ICD-10-CM

## 2022-03-04 DIAGNOSIS — K7030 Alcoholic cirrhosis of liver without ascites: Secondary | ICD-10-CM | POA: Diagnosis present

## 2022-03-04 DIAGNOSIS — R1114 Bilious vomiting: Secondary | ICD-10-CM

## 2022-03-04 DIAGNOSIS — Z87891 Personal history of nicotine dependence: Secondary | ICD-10-CM

## 2022-03-04 DIAGNOSIS — Z978 Presence of other specified devices: Secondary | ICD-10-CM

## 2022-03-04 DIAGNOSIS — E162 Hypoglycemia, unspecified: Secondary | ICD-10-CM | POA: Diagnosis not present

## 2022-03-04 DIAGNOSIS — C259 Malignant neoplasm of pancreas, unspecified: Secondary | ICD-10-CM | POA: Diagnosis present

## 2022-03-04 DIAGNOSIS — Z7984 Long term (current) use of oral hypoglycemic drugs: Secondary | ICD-10-CM

## 2022-03-04 DIAGNOSIS — K831 Obstruction of bile duct: Secondary | ICD-10-CM

## 2022-03-04 DIAGNOSIS — A09 Infectious gastroenteritis and colitis, unspecified: Secondary | ICD-10-CM | POA: Diagnosis not present

## 2022-03-04 DIAGNOSIS — E785 Hyperlipidemia, unspecified: Secondary | ICD-10-CM | POA: Diagnosis present

## 2022-03-04 DIAGNOSIS — I81 Portal vein thrombosis: Secondary | ICD-10-CM

## 2022-03-04 DIAGNOSIS — E876 Hypokalemia: Secondary | ICD-10-CM | POA: Diagnosis present

## 2022-03-04 DIAGNOSIS — K2980 Duodenitis without bleeding: Secondary | ICD-10-CM | POA: Diagnosis present

## 2022-03-04 DIAGNOSIS — D500 Iron deficiency anemia secondary to blood loss (chronic): Secondary | ICD-10-CM | POA: Diagnosis present

## 2022-03-04 DIAGNOSIS — R112 Nausea with vomiting, unspecified: Secondary | ICD-10-CM | POA: Diagnosis not present

## 2022-03-04 DIAGNOSIS — K55059 Acute (reversible) ischemia of intestine, part and extent unspecified: Secondary | ICD-10-CM | POA: Diagnosis present

## 2022-03-04 DIAGNOSIS — Z7901 Long term (current) use of anticoagulants: Secondary | ICD-10-CM

## 2022-03-04 DIAGNOSIS — E222 Syndrome of inappropriate secretion of antidiuretic hormone: Secondary | ICD-10-CM | POA: Diagnosis present

## 2022-03-04 DIAGNOSIS — Z806 Family history of leukemia: Secondary | ICD-10-CM

## 2022-03-04 DIAGNOSIS — D72829 Elevated white blood cell count, unspecified: Secondary | ICD-10-CM | POA: Diagnosis not present

## 2022-03-04 DIAGNOSIS — R590 Localized enlarged lymph nodes: Secondary | ICD-10-CM | POA: Diagnosis present

## 2022-03-04 DIAGNOSIS — E860 Dehydration: Secondary | ICD-10-CM | POA: Diagnosis present

## 2022-03-04 DIAGNOSIS — K55069 Acute infarction of intestine, part and extent unspecified: Secondary | ICD-10-CM

## 2022-03-04 DIAGNOSIS — K269 Duodenal ulcer, unspecified as acute or chronic, without hemorrhage or perforation: Secondary | ICD-10-CM | POA: Diagnosis present

## 2022-03-04 DIAGNOSIS — E1165 Type 2 diabetes mellitus with hyperglycemia: Secondary | ICD-10-CM | POA: Diagnosis present

## 2022-03-04 LAB — CBC WITH DIFFERENTIAL/PLATELET
Abs Immature Granulocytes: 0.02 10*3/uL (ref 0.00–0.07)
Basophils Absolute: 0.1 10*3/uL (ref 0.0–0.1)
Basophils Absolute: 0 10*3/uL (ref 0.0–0.1)
Basophils Relative: 0.9 % (ref 0.0–3.0)
Basophils Relative: 1 %
Eosinophils Absolute: 0 10*3/uL (ref 0.0–0.7)
Eosinophils Absolute: 0 10*3/uL (ref 0.0–0.5)
Eosinophils Relative: 0.4 % (ref 0.0–5.0)
Eosinophils Relative: 0 %
HCT: 35.7 % — ABNORMAL LOW (ref 36.0–46.0)
HCT: 33.5 % — ABNORMAL LOW (ref 36.0–46.0)
Hemoglobin: 12.1 g/dL (ref 12.0–15.0)
Hemoglobin: 11.3 g/dL — ABNORMAL LOW (ref 12.0–15.0)
Immature Granulocytes: 0 %
Lymphocytes Relative: 10.4 % — ABNORMAL LOW (ref 12.0–46.0)
Lymphocytes Relative: 15 %
Lymphs Abs: 0.8 10*3/uL (ref 0.7–4.0)
Lymphs Abs: 1.2 10*3/uL (ref 0.7–4.0)
MCH: 28 pg (ref 26.0–34.0)
MCHC: 33.8 g/dL (ref 30.0–36.0)
MCHC: 33.7 g/dL (ref 30.0–36.0)
MCV: 84.9 fl (ref 78.0–100.0)
MCV: 83.1 fL (ref 80.0–100.0)
Monocytes Absolute: 0.3 10*3/uL (ref 0.1–1.0)
Monocytes Absolute: 0.5 10*3/uL (ref 0.1–1.0)
Monocytes Relative: 4 % (ref 3.0–12.0)
Monocytes Relative: 6 %
Neutro Abs: 6.7 10*3/uL (ref 1.4–7.7)
Neutro Abs: 6.7 10*3/uL (ref 1.7–7.7)
Neutrophils Relative %: 84.3 % — ABNORMAL HIGH (ref 43.0–77.0)
Neutrophils Relative %: 78 %
Platelets: 280 10*3/uL (ref 150.0–400.0)
Platelets: 258 10*3/uL (ref 150–400)
RBC: 4.2 Mil/uL (ref 3.87–5.11)
RBC: 4.03 MIL/uL (ref 3.87–5.11)
RDW: 18 % — ABNORMAL HIGH (ref 11.5–15.5)
RDW: 15.8 % — ABNORMAL HIGH (ref 11.5–15.5)
WBC: 8 10*3/uL (ref 4.0–10.5)
WBC: 8.5 10*3/uL (ref 4.0–10.5)
nRBC: 0 % (ref 0.0–0.2)

## 2022-03-04 LAB — BASIC METABOLIC PANEL
Anion gap: 11 (ref 5–15)
BUN: 5 mg/dL — ABNORMAL LOW (ref 8–23)
CO2: 28 mmol/L (ref 22–32)
Calcium: 9 mg/dL (ref 8.9–10.3)
Chloride: 92 mmol/L — ABNORMAL LOW (ref 98–111)
Creatinine, Ser: 0.41 mg/dL — ABNORMAL LOW (ref 0.44–1.00)
GFR, Estimated: >60 mL/min (ref >60)
Glucose, Bld: 104 mg/dL — ABNORMAL HIGH (ref 70–99)
Potassium: 3 mmol/L — ABNORMAL LOW (ref 3.5–5.1)
Sodium: 131 mmol/L — ABNORMAL LOW (ref 135–145)

## 2022-03-04 LAB — PROTIME-INR
INR: 1.1 ratio — ABNORMAL HIGH (ref 0.8–1.0)
Prothrombin Time: 11.9 s (ref 9.6–13.1)

## 2022-03-04 LAB — COMPREHENSIVE METABOLIC PANEL
ALT: 20 U/L (ref 0–35)
ALT: 22 U/L (ref 0–44)
AST: 26 U/L (ref 0–37)
AST: 24 U/L (ref 15–41)
Albumin: 3.8 g/dL (ref 3.5–5.2)
Albumin: 3.3 g/dL — ABNORMAL LOW (ref 3.5–5.0)
Alkaline Phosphatase: 72 U/L (ref 39–117)
Alkaline Phosphatase: 64 U/L (ref 38–126)
Anion gap: 12 (ref 5–15)
BUN: 6 mg/dL (ref 6–23)
BUN: 5 mg/dL — ABNORMAL LOW (ref 8–23)
CO2: 29 mEq/L (ref 19–32)
CO2: 28 mmol/L (ref 22–32)
Calcium: 9.7 mg/dL (ref 8.4–10.5)
Calcium: 9 mg/dL (ref 8.9–10.3)
Chloride: 81 mEq/L — ABNORMAL LOW (ref 96–112)
Chloride: 84 mmol/L — ABNORMAL LOW (ref 98–111)
Creatinine, Ser: 0.62 mg/dL (ref 0.40–1.20)
Creatinine, Ser: 0.53 mg/dL (ref 0.44–1.00)
GFR, Estimated: >60 mL/min (ref >60)
GFR: 93.75 mL/min (ref >60.00)
Glucose, Bld: 216 mg/dL — ABNORMAL HIGH (ref 70–99)
Glucose, Bld: 139 mg/dL — ABNORMAL HIGH (ref 70–99)
Potassium: 2.8 mEq/L — CL (ref 3.5–5.1)
Potassium: 2.8 mmol/L — ABNORMAL LOW (ref 3.5–5.1)
Sodium: 122 mEq/L — ABNORMAL LOW (ref 135–145)
Sodium: 124 mmol/L — ABNORMAL LOW (ref 135–145)
Total Bilirubin: 0.7 mg/dL (ref 0.2–1.2)
Total Bilirubin: 0.9 mg/dL (ref 0.3–1.2)
Total Protein: 7.9 g/dL (ref 6.0–8.3)
Total Protein: 7.5 g/dL (ref 6.5–8.1)

## 2022-03-04 LAB — LIPASE, BLOOD: Lipase: 21 U/L (ref 11–51)

## 2022-03-04 LAB — MAGNESIUM: Magnesium: 2 mg/dL (ref 1.7–2.4)

## 2022-03-04 MED ORDER — ONDANSETRON HCL 8 MG PO TABS: 8.0000 mg | ORAL_TABLET | Freq: Two times a day (BID) | ORAL | 1 refills | Status: DC | PRN

## 2022-03-04 MED ORDER — SUCRALFATE 1 G PO TABS: 1.0000 g | ORAL_TABLET | Freq: Two times a day (BID) | ORAL | 0 refills | Status: DC

## 2022-03-04 MED ORDER — APIXABAN 5 MG PO TABS: 5.0000 mg | ORAL_TABLET | Freq: Two times a day (BID) | ORAL | 0 refills | Status: DC

## 2022-03-04 MED ORDER — IOHEXOL 300 MG/ML  SOLN
100.0000 mL | Freq: Once | INTRAMUSCULAR | Status: AC | PRN
  Administered 2022-03-04: 100 mL via INTRAVENOUS

## 2022-03-04 MED ORDER — HYDROMORPHONE HCL 4 MG PO TABS: 4.0000 mg | ORAL_TABLET | ORAL | 0 refills | Status: DC | PRN

## 2022-03-04 MED ORDER — LACTATED RINGERS IV BOLUS
1000.0000 mL | Freq: Once | INTRAVENOUS | Status: AC
  Administered 2022-03-04: 1000 mL via INTRAVENOUS

## 2022-03-04 MED ORDER — POTASSIUM CHLORIDE 10 MEQ/100ML IV SOLN
10.0000 meq | INTRAVENOUS | Status: AC
  Administered 2022-03-04 (×3): 10 meq via INTRAVENOUS
  Filled 2022-03-04 (×3): qty 100

## 2022-03-04 MED ORDER — HYDROMORPHONE HCL 1 MG/ML IJ SOLN
0.5000 mg | Freq: Once | INTRAMUSCULAR | Status: AC
  Administered 2022-03-04: 0.5 mg via INTRAVENOUS
  Filled 2022-03-04: qty 1

## 2022-03-04 NOTE — Progress Notes (Signed)
Attending Physician's Attestation   I have reviewed the chart.   I agree with the Advanced Practitioner's note, impression, and recommendations with any updates as below.    Angles Trevizo Mansouraty, MD [facility] Office # [phone]  

## 2022-03-04 NOTE — ED Notes (Signed)
Patient to imaging.

## 2022-03-04 NOTE — Telephone Encounter (Signed)
Hi...Ms. Melanie Alvarez just called from the ER and wanted to know if she needed to stay.  She's been there this whole time and all they have done is take her temperature and blood pressure.  She asked to speak to you.  Her phone number is [phone].  Thank you.

## 2022-03-04 NOTE — Patient Instructions (Addendum)
Your provider has requested that you go to the basement level for lab work before leaving today. Press "B" on the elevator. The lab is located at the first door on the left as you exit the elevator.  Please make sure you are on the pantoprazole, INCREASE TO TWICE A DAY Will add on carafate to help with possible bile and can help diarrhea.  Hold metformin for now  Discuss with PCP or cancer center about MOST forms/living will.   Gastroparesis Please do small frequent meals like 4-6 meals a day.  Eat and drink liquids at separate times.  Avoid high fiber foods, cook your vegetables, avoid high fat food.  Suggest spreading protein throughout the day (greek yogurt, glucerna, soft meat, milk, eggs) Choose soft foods that you can mash with a fork When you are more symptomatic, change to pureed foods foods and liquids.  Consider reading "Living well with Gastroparesis" by Lambert Keto Gastroparesis is a condition in which food takes longer than normal to empty from the stomach. This condition is also known as delayed gastric emptying. It is usually a long-term (chronic) condition. There is no cure, but there are treatments and things that you can do at home to help relieve symptoms. Treating the underlying condition that causes gastroparesis can also help relieve symptoms What are the causes? In many cases, the cause of this condition is not known. Possible causes include: A hormone (endocrine) disorder, such as hypothyroidism or diabetes. A nervous system disease, such as Parkinson's disease or multiple sclerosis. Cancer, infection, or surgery that affects the stomach or vagus nerve. The vagus nerve runs from your chest, through your neck, and to the lower part of your brain. A connective tissue disorder, such as scleroderma. Certain medicines. What increases the risk? You are more likely to develop this condition if: You have certain disorders or diseases. These may include: An  endocrine disorder. An eating disorder. Amyloidosis. Scleroderma. Parkinson's disease. Multiple sclerosis. Cancer or infection of the stomach or the vagus nerve. You have had surgery on your stomach or vagus nerve. You take certain medicines. You are female. What are the signs or symptoms? Symptoms of this condition include: Feeling full after eating very little or a loss of appetite. Nausea, vomiting, or heartburn. Bloating of your abdomen. Inconsistent blood sugar (glucose) levels on blood tests. Unexplained weight loss. Acid from the stomach coming up into the esophagus (gastroesophageal reflux). Sudden tightening (spasm) of the stomach, which can be painful. Symptoms may come and go. Some people may not notice any symptoms. How is this diagnosed? This condition is diagnosed with tests, such as: Tests that check how long it takes food to move through the stomach and intestines. These tests include: Upper gastrointestinal (GI) series. For this test, you drink a liquid that shows up well on X-rays, and then X-rays are taken of your intestines. Gastric emptying scintigraphy. For this test, you eat food that contains a small amount of radioactive material, and then scans are taken. Wireless capsule GI monitoring system. For this test, you swallow a pill (capsule) that records information about how foods and fluid move through your stomach. Gastric manometry. For this test, a tube is passed down your throat and into your stomach to measure electrical and muscular activity. Endoscopy. For this test, a long, thin tube with a camera and light on the end is passed down your throat and into your stomach to check for problems in your stomach lining. Ultrasound. This test uses sound waves to  create images of the inside of your body. This can help rule out gallbladder disease or pancreatitis as a cause of your symptoms. How is this treated? There is no cure for this condition, but treatment and  home care may relieve symptoms. Treatment may include: Treating the underlying cause. Managing your symptoms by making changes to your diet and exercise habits. Taking medicines to control nausea and vomiting and to stimulate stomach muscles. Getting food through a feeding tube in the hospital. This may be done in severe cases. Having surgery to insert a device called a gastric electrical stimulator into your body. This device helps improve stomach emptying and control nausea and vomiting. Follow these instructions at home: Take over-the-counter and prescription medicines only as told by your health care provider. Follow instructions from your health care provider about eating or drinking restrictions. Your health care provider may recommend that you: Eat smaller meals more often. Eat low-fat foods. Eat low-fiber forms of high-fiber foods. For example, eat cooked vegetables instead of raw vegetables. Have only liquid foods instead of solid foods. Liquid foods are easier to digest. Drink enough fluid to keep your urine pale yellow. Exercise as often as told by your health care provider. Keep all follow-up visits. This is important. Contact a health care provider if you: Notice that your symptoms do not improve with treatment. Have new symptoms. Get help right away if you: Have severe pain in your abdomen that does not improve with treatment. Have nausea that is severe or does not go away. Vomit every time you drink fluids. Summary Gastroparesis is a long-term (chronic) condition in which food takes longer than normal to empty from the stomach. Symptoms include nausea, vomiting, heartburn, bloating of your abdomen, and loss of appetite. Eating smaller portions, low-fat foods, and low-fiber forms of high-fiber foods may help you manage your symptoms. Get help right away if you have severe pain in your abdomen. This information is not intended to replace advice given to you by your health  care provider. Make sure you discuss any questions you have with your health care provider. Document Revised: 12/13/2019 Document Reviewed: 12/13/2019 Elsevier Patient Education  2021 [employer].  I appreciate the opportunity to care for you. Vicie Mutters, PA-C

## 2022-03-04 NOTE — ED Notes (Signed)
Labs were done at [place] earlier today and PA said it didn't need to be done again

## 2022-03-04 NOTE — Telephone Encounter (Signed)
-----   Message from Patti E [place], [place] sent at 03/04/2022  1:01 PM EDT ----- Critical labs: potassium 2.8 and sodium 121.6   The lab just called.

## 2022-03-04 NOTE — ED Provider Triage Note (Signed)
Emergency Medicine Provider Triage Evaluation Note  Melanie Alvarez , a 65 y.o. female  was evaluated in triage.  Pt complains of low sodium and low potassium.  Pt sent here from gi doctor  Review of Systems  Positive:  Negative:   Physical Exam  BP 134/78 (BP Location: Right Arm)   Pulse 80   Temp 98.3 F (36.8 C) (Oral)   Resp 16   Ht '5\' 3"'$  (1.6 m)   Wt 68 kg   SpO2 95%   BMI 26.57 kg/m  Gen:   Awake, no distress   Resp:  Normal effort  MSK:   Moves extremities without difficulty  Other:    Medical Decision Making  Medically screening exam initiated at 2:47 PM.  Appropriate orders placed.  Melanie Alvarez was informed that the remainder of the evaluation will be completed by another provider, this initial triage assessment does not replace that evaluation, and the importance of remaining in the ED until their evaluation is complete.     Fransico Meadow, [place] 03/04/22 1448

## 2022-03-04 NOTE — ED Triage Notes (Addendum)
PCP sent pt to ED due to K+ 2.8 this morning on labs obtained at office. Reports abd pain

## 2022-03-04 NOTE — Telephone Encounter (Signed)
66 year old female with history of adenocarcinoma pancreas diagnosed 2021 following with Dr. Marin Olp has not been on chemotherapy since 12/24/2021 has had disease progression last CT 02/14/2022.    Nausea vomiting, states unable to eat or drink anything 30 pound weight loss, diarrhea 3-4 times daily.  History of portal vein and mesenteric thrombosis started on Eliquis 12/24/2021, patient ran out and has not been on it for 2 weeks.  Labs showed a potassium of 2.8 and sodium of 121 compared to labs on 02/15/2022 sodium 134 potassium 3.2. Patient has been having some weakness while in the office. With patient's inability to eat or drink, acutely worsening hyponatremia and hypokalemia, told patient to go to the ER. Husband is taking her to [place] at this time.

## 2022-03-04 NOTE — Telephone Encounter (Signed)
Told patient with last potassium, sodium and unable to take anything by mouth with diarrhea, she needs replacement and monitoring.  She will call cancer center to see if they can help outpatient but otherwise stay in the ER.

## 2022-03-04 NOTE — ED Provider Notes (Signed)
[facility] DEPT Provider Note   CSN: [id number] Arrival date & time: 03/04/22  1338     History  Chief Complaint  Patient presents with   Abnormal Labs    Melanie Alvarez is a 66 y.o. female.  HPI Patient presents with nausea vomiting and abdominal pain.  Generalized weakness.  Decreased oral intake.  Has been going for a while now.  States she has lost a fair amount of weight.  Has known history of pancreatic cancer.  Also recent hepatic abscess.  Went to GI today and found to have a potassium of 2.8 and sodium of 121.  Both are decreased from baseline.  States she is really not been able to eat much.  Feels lightheaded and dizzy.  Also some mild diarrhea.  Pain is diffuse and cannot eat somewhat because of it.   Past Medical History:  Diagnosis Date   Cancer ([place])    Cirrhosis ([place])    Diabetes mellitus 2012   Diverticulosis    Gallbladder sludge    Hepatitis 2010   history of Hepatitis C   Hepatitis C    Hypertension 2011   Obesity     Home Medications Prior to Admission medications   Medication Sig Start Date End Date Taking? Authorizing Provider  ALPRAZolam Duanne Moron) 1 MG tablet Take 0.5 tablets (0.5 mg total) by mouth every 8 (eight) hours as needed for anxiety. 02/11/22   Volanda Napoleon, MD  aluminum-magnesium hydroxide-simethicone (MAALOX) [phone] MG/5ML SUSP Take 30 mLs by mouth 4 (four) times daily -  before meals and at bedtime. Patient not taking: Reported on 03/04/2022 01/25/22   Sherwood Gambler, MD  amLODipine (NORVASC) 10 MG tablet Take 1 tablet (10 mg total) by mouth at bedtime. 11/27/21   Nche, Charlene Brooke, NP  Amylase-Lipase-Protease (CREON 10 PO) Take 1 tablet by mouth daily.    [provider]  apixaban (ELIQUIS) 5 MG TABS tablet Take 1 tablet (5 mg total) by mouth 2 (two) times daily. 03/04/22   Vladimir Crofts, PA-C  atorvastatin (LIPITOR) 20 MG tablet Take 1 tablet (20 mg total) by mouth at bedtime. 01/25/21    Nche, Charlene Brooke, NP  CREON [phone] units CPEP capsule TAKE 2 CAPSULES BY MOUTH THREE TIMES DAILY BEFORE MEAL(S) Patient not taking: Reported on 03/04/2022 02/01/22   Volanda Napoleon, MD  feeding supplement (BOOST HIGH PROTEIN) LIQD Take 2 Containers by mouth in the morning. Patient not taking: Reported on 03/04/2022    [provider]  fentaNYL (DURAGESIC) 25 MCG/HR Place 1 patch onto the skin every 3 (three) days. Patient not taking: Reported on 03/04/2022 01/02/22   Volanda Napoleon, MD  fluticasone [facility]) 50 MCG/ACT nasal spray Place 2 sprays into both nostrils daily. Patient not taking: Reported on 03/04/2022 05/14/21   Tower, Wynelle Fanny, MD  glipiZIDE (GLUCOTROL XL) 5 MG 24 hr tablet Take 1 tablet (5 mg total) by mouth daily with breakfast. 09/14/21   Nche, Charlene Brooke, NP  glucose blood test strip Test blood glucose once daily. One Touch Verio test strips Patient not taking: Reported on 03/04/2022 09/28/19   Nche, Charlene Brooke, NP  HYDROmorphone (DILAUDID) 4 MG tablet Take 1 tablet (4 mg total) by mouth every 4 (four) hours as needed for severe pain. 03/04/22   Volanda Napoleon, MD  lidocaine-prilocaine (EMLA) cream Apply to affected area once Patient not taking: Reported on 03/04/2022 12/26/21   Volanda Napoleon, MD  metFORMIN (GLUCOPHAGE XR) 750 MG  24 hr tablet Take 1 tablet (750 mg total) by mouth daily with breakfast. 09/14/21   Nche, Charlene Brooke, NP  metoprolol succinate (TOPROL-XL) 25 MG 24 hr tablet TAKE 1 TABLET BY MOUTH ONCE DAILY Patient not taking: Reported on 03/04/2022 01/25/21   Nche, Charlene Brooke, NP  NON FORMULARY Take 1 tablet by mouth daily.    [provider]  olmesartan (BENICAR) 5 MG tablet Take 1 tablet by mouth once daily 02/11/22   Nche, Charlene Brooke, NP  ondansetron (ZOFRAN) 8 MG tablet Take 1 tablet (8 mg total) by mouth 2 (two) times daily as needed (Nausea or vomiting). 03/04/22   Vladimir Crofts, PA-C  pantoprazole (PROTONIX) 40 MG tablet Take  1 tablet (40 mg total) by mouth 2 (two) times daily for 14 days. Patient not taking: Reported on 01/15/2022 12/04/21 12/18/21  Flossie Buffy, NP  pantoprazole (PROTONIX) 40 MG tablet Take 1 tablet (40 mg total) by mouth daily. Patient not taking: Reported on 03/04/2022 12/24/21   Volanda Napoleon, MD  potassium chloride (KLOR-CON M) 20 MEQ tablet Take 1 tablet (20 mEq total) by mouth 2 (two) times daily. Patient not taking: Reported on 01/15/2022 11/27/21   Nche, Charlene Brooke, NP  prochlorperazine (COMPAZINE) 10 MG tablet Take 1 tablet (10 mg total) by mouth every 6 (six) hours as needed (Nausea or vomiting). Patient not taking: Reported on 03/04/2022 12/26/21   Volanda Napoleon, MD  sucralfate (CARAFATE) 1 g tablet Take 1 tablet (1 g total) by mouth 2 (two) times daily. 03/04/22   Vladimir Crofts, PA-C  TYLENOL 8 HOUR 650 MG CR tablet Take 650-1,300 mg by mouth every 8 (eight) hours as needed for pain. Patient not taking: Reported on 03/04/2022    [provider]      Allergies    Lisinopril    Review of Systems   Review of Systems  Physical Exam Updated Vital Signs BP (!) 146/81   Pulse 97   Temp 97.9 F (36.6 C) (Oral)   Resp 16   Ht '5\' 3"'$  (1.6 m)   Wt 68 kg   SpO2 98%   BMI 26.57 kg/m  Physical Exam Vitals and nursing note reviewed.  Cardiovascular:     Rate and Rhythm: Normal rate.  Abdominal:     Tenderness: There is abdominal tenderness.     Comments: Upper abdominal tenderness without rebound or guarding.  No hernias palpated.  Musculoskeletal:        General: No tenderness.  Skin:    General: Skin is warm.     Capillary Refill: Capillary refill takes less than 2 seconds.  Neurological:     Mental Status: She is alert.     ED Results / Procedures / Treatments   Labs (all labs ordered are listed, but only abnormal results are displayed) Labs Reviewed  CBC WITH DIFFERENTIAL/PLATELET - Abnormal; Notable for the following components:      Result Value    Hemoglobin 11.3 (*)    HCT 33.5 (*)    RDW 15.8 (*)    All other components within normal limits  COMPREHENSIVE METABOLIC PANEL - Abnormal; Notable for the following components:   Sodium 124 (*)    Potassium 2.8 (*)    Chloride 84 (*)    Glucose, Bld 139 (*)    BUN 5 (*)    Albumin 3.3 (*)    All other components within normal limits  MAGNESIUM  LIPASE, BLOOD    EKG None  Radiology CT ABDOMEN PELVIS W CONTRAST  Result Date: 03/04/2022 CLINICAL DATA:  Nausea, vomiting, unspecified abdominal pain EXAM: CT ABDOMEN AND PELVIS WITH CONTRAST TECHNIQUE: Multidetector CT imaging of the abdomen and pelvis was performed using the standard protocol following bolus administration of intravenous contrast. RADIATION DOSE REDUCTION: This exam was performed according to the departmental dose-optimization program which includes automated exposure control, adjustment of the mA and/or kV according to patient size and/or use of iterative reconstruction technique. CONTRAST:  155m OMNIPAQUE IOHEXOL 300 MG/ML  SOLN COMPARISON:  02/14/2022 FINDINGS: Lower chest: No acute abnormality. Hepatobiliary: Palliative metallic biliary stent within the common duct extending through the ampulla. Pneumobilia is in keeping with patency of the a stented segment. Mild intra and extrahepatic biliary ductal dilation is unchanged. Previously noted hepatic abscess drainage catheter has been removed in the interval. Small wedge like region of non enhancement within the left hepatic lobe likely represents a small amount of residual scarring. No focal intrahepatic mass. Gas within the gallbladder lumen again noted. The gallbladder is not distended no pericholecystic inflammatory changes identified. Pancreas: Partially calcified pancreatic head mass is again seen, grossly unchanged from prior examination but poorly delineated on this examination. Dilation of the pancreatic duct distal to the mass is again seen with associated pancreatic  parenchymal atrophy. No superimposed peripancreatic inflammatory changes. Spleen: Unremarkable Adrenals/Urinary Tract: The adrenal glands are unremarkable. The kidneys are normal in size and position. 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 The kidneys are otherwise unremarkable. The bladder is unremarkable. Stomach/Bowel: Stomach is within normal limits. Appendix appears normal. No evidence of bowel wall thickening, distention, or inflammatory changes. No free intraperitoneal gas or fluid. Vascular/Lymphatic: Occlusion of the terminal superior mesenteric vein and splenic vein is again identified in the region of the pancreatic mass. The main portal vein remains patent, reconstituted via the left gastric vein. Moderate aortoiliac atherosclerotic calcification. No aortic aneurysm. No pathologic adenopathy within the abdomen and pelvis. Stable shotty periceliac adenopathy, nonspecific, measuring up to 9 mm in short axis diameter at axial image # 20/2. Reproductive: Uterus and bilateral adnexa are unremarkable. Other: No abdominal wall hernia Musculoskeletal: No acute bone abnormality. No lytic or blastic bone lesion. IMPRESSION: 1. No acute intra-abdominal pathology identified. No definite radiographic explanation for the patient's reported symptoms. 2. Interval removal of hepatic abscess drainage catheter. No recurrent abscess identified. Wedge like region of probable fibrosis in the area of prior abscess. 3. Stable mild intra and extrahepatic biliary ductal dilation. Palliative metallic biliary stent remains in place. Pneumobilia in keeping with patency of the stented segment. 4. Grossly stable pancreatic head mass with associated occlusion of the terminal superior mesenteric vein and splenic vein. 5. 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 Electronically Signed   By: AFidela SalisburyM.D.   On: 03/04/2022 19:55     Procedures Procedures    Medications Ordered in ED Medications  potassium chloride 10 mEq in 100 mL IVPB (10 mEq Intravenous New Bag/Given 03/04/22 2030)  lactated ringers bolus 1,000 mL (0 mLs Intravenous Stopped 03/04/22 2108)  iohexol (OMNIPAQUE) 300 MG/ML solution 100 mL (100 mLs Intravenous Contrast Given 03/04/22 1927)    ED Course/ Medical Decision Making/ A&P                           Medical Decision Making Amount and/or Complexity of Data Reviewed Labs: ordered. Radiology: ordered.  Risk Prescription drug  management.   Patient sent in by PCP for laboratory abnormalities.  Likely secondary to decreased oral intake and has known pancreatic cancer.  Previous and somewhat recent hepatic abscess also.  No fevers.  Has been dwindling down.  Lab work shows low sodium and low potassium.  Has not been able to eat to eat and drink enough to orally supplement this will require admission the hospital.  CT scan done and is stable to prior.  No new findings for the pain.  I think will require admission for hydration and electrolyte supplementation.  Will discuss with hospitalist        Final Clinical Impression(s) / ED Diagnoses Final diagnoses:  Dehydration  Diarrhea, unspecified type  Hypokalemia    Rx / DC Orders ED Discharge Orders     None         Davonna Belling, MD 03/04/22 2125

## 2022-03-05 DIAGNOSIS — K55059 Acute (reversible) ischemia of intestine, part and extent unspecified: Secondary | ICD-10-CM | POA: Diagnosis present

## 2022-03-05 DIAGNOSIS — C250 Malignant neoplasm of head of pancreas: Secondary | ICD-10-CM | POA: Diagnosis present

## 2022-03-05 DIAGNOSIS — E876 Hypokalemia: Secondary | ICD-10-CM | POA: Diagnosis present

## 2022-03-05 DIAGNOSIS — K55069 Acute infarction of intestine, part and extent unspecified: Secondary | ICD-10-CM | POA: Diagnosis not present

## 2022-03-05 DIAGNOSIS — I82210 Acute embolism and thrombosis of superior vena cava: Secondary | ICD-10-CM | POA: Diagnosis present

## 2022-03-05 DIAGNOSIS — R590 Localized enlarged lymph nodes: Secondary | ICD-10-CM | POA: Diagnosis present

## 2022-03-05 DIAGNOSIS — Z833 Family history of diabetes mellitus: Secondary | ICD-10-CM | POA: Diagnosis not present

## 2022-03-05 DIAGNOSIS — I82890 Acute embolism and thrombosis of other specified veins: Secondary | ICD-10-CM

## 2022-03-05 DIAGNOSIS — Z79899 Other long term (current) drug therapy: Secondary | ICD-10-CM | POA: Diagnosis not present

## 2022-03-05 DIAGNOSIS — E860 Dehydration: Secondary | ICD-10-CM | POA: Diagnosis present

## 2022-03-05 DIAGNOSIS — Z8249 Family history of ischemic heart disease and other diseases of the circulatory system: Secondary | ICD-10-CM | POA: Diagnosis not present

## 2022-03-05 DIAGNOSIS — I10 Essential (primary) hypertension: Secondary | ICD-10-CM | POA: Diagnosis present

## 2022-03-05 DIAGNOSIS — R1013 Epigastric pain: Secondary | ICD-10-CM

## 2022-03-05 DIAGNOSIS — Z7901 Long term (current) use of anticoagulants: Secondary | ICD-10-CM | POA: Diagnosis not present

## 2022-03-05 DIAGNOSIS — K269 Duodenal ulcer, unspecified as acute or chronic, without hemorrhage or perforation: Secondary | ICD-10-CM | POA: Diagnosis present

## 2022-03-05 DIAGNOSIS — Z7984 Long term (current) use of oral hypoglycemic drugs: Secondary | ICD-10-CM | POA: Diagnosis not present

## 2022-03-05 DIAGNOSIS — E222 Syndrome of inappropriate secretion of antidiuretic hormone: Secondary | ICD-10-CM | POA: Diagnosis present

## 2022-03-05 DIAGNOSIS — D500 Iron deficiency anemia secondary to blood loss (chronic): Secondary | ICD-10-CM | POA: Diagnosis present

## 2022-03-05 DIAGNOSIS — I959 Hypotension, unspecified: Secondary | ICD-10-CM | POA: Diagnosis present

## 2022-03-05 DIAGNOSIS — E785 Hyperlipidemia, unspecified: Secondary | ICD-10-CM | POA: Diagnosis present

## 2022-03-05 DIAGNOSIS — R112 Nausea with vomiting, unspecified: Secondary | ICD-10-CM

## 2022-03-05 DIAGNOSIS — E1165 Type 2 diabetes mellitus with hyperglycemia: Secondary | ICD-10-CM | POA: Diagnosis present

## 2022-03-05 DIAGNOSIS — E871 Hypo-osmolality and hyponatremia: Secondary | ICD-10-CM | POA: Diagnosis not present

## 2022-03-05 DIAGNOSIS — K838 Other specified diseases of biliary tract: Secondary | ICD-10-CM | POA: Diagnosis present

## 2022-03-05 DIAGNOSIS — K315 Obstruction of duodenum: Secondary | ICD-10-CM | POA: Diagnosis present

## 2022-03-05 DIAGNOSIS — C251 Malignant neoplasm of body of pancreas: Secondary | ICD-10-CM | POA: Diagnosis not present

## 2022-03-05 DIAGNOSIS — K7030 Alcoholic cirrhosis of liver without ascites: Secondary | ICD-10-CM | POA: Diagnosis present

## 2022-03-05 DIAGNOSIS — K8689 Other specified diseases of pancreas: Secondary | ICD-10-CM | POA: Diagnosis present

## 2022-03-05 DIAGNOSIS — C259 Malignant neoplasm of pancreas, unspecified: Secondary | ICD-10-CM | POA: Diagnosis not present

## 2022-03-05 DIAGNOSIS — K7460 Unspecified cirrhosis of liver: Secondary | ICD-10-CM | POA: Diagnosis not present

## 2022-03-05 HISTORY — DX: Nausea with vomiting, unspecified: R11.2

## 2022-03-05 LAB — BASIC METABOLIC PANEL
Anion gap: 10 (ref 5–15)
BUN: <5 mg/dL — ABNORMAL LOW (ref 8–23)
CO2: 28 mmol/L (ref 22–32)
Calcium: 9.2 mg/dL (ref 8.9–10.3)
Chloride: 94 mmol/L — ABNORMAL LOW (ref 98–111)
Creatinine, Ser: 0.43 mg/dL — ABNORMAL LOW (ref 0.44–1.00)
GFR, Estimated: >60 mL/min (ref >60)
Glucose, Bld: 93 mg/dL (ref 70–99)
Potassium: 3.1 mmol/L — ABNORMAL LOW (ref 3.5–5.1)
Sodium: 132 mmol/L — ABNORMAL LOW (ref 135–145)

## 2022-03-05 LAB — RENAL FUNCTION PANEL
Albumin: 3.3 g/dL — ABNORMAL LOW (ref 3.5–5.0)
Anion gap: 12 (ref 5–15)
BUN: <5 mg/dL — ABNORMAL LOW (ref 8–23)
CO2: 25 mmol/L (ref 22–32)
Calcium: 9.3 mg/dL (ref 8.9–10.3)
Chloride: 94 mmol/L — ABNORMAL LOW (ref 98–111)
Creatinine, Ser: 0.46 mg/dL (ref 0.44–1.00)
GFR, Estimated: >60 mL/min (ref >60)
Glucose, Bld: 134 mg/dL — ABNORMAL HIGH (ref 70–99)
Phosphorus: 3.4 mg/dL (ref 2.5–4.6)
Potassium: 3.2 mmol/L — ABNORMAL LOW (ref 3.5–5.1)
Sodium: 131 mmol/L — ABNORMAL LOW (ref 135–145)

## 2022-03-05 LAB — MAGNESIUM: Magnesium: 2.3 mg/dL (ref 1.7–2.4)

## 2022-03-05 LAB — GLUCOSE, CAPILLARY: Glucose-Capillary: 125 mg/dL — ABNORMAL HIGH (ref 70–99)

## 2022-03-05 MED ORDER — ORAL CARE MOUTH RINSE: 15.0000 mL | OROMUCOSAL | Status: DC | PRN

## 2022-03-05 MED ORDER — PANTOPRAZOLE SODIUM 40 MG PO TBEC
40.0000 mg | DELAYED_RELEASE_TABLET | Freq: Every day | ORAL | Status: DC
  Administered 2022-03-05: 40 mg via ORAL
  Filled 2022-03-05: qty 1

## 2022-03-05 MED ORDER — SUCRALFATE 1 GM/10ML PO SUSP
1.0000 g | Freq: Three times a day (TID) | ORAL | Status: DC
  Administered 2022-03-05 – 2022-03-07 (×7): 1 g via ORAL
  Filled 2022-03-05 (×8): qty 10

## 2022-03-05 MED ORDER — PANCRELIPASE (LIP-PROT-AMYL) 36000-114000 UNITS PO CPEP
72000.0000 [IU] | ORAL_CAPSULE | Freq: Three times a day (TID) | ORAL | Status: DC
  Administered 2022-03-05 – 2022-03-07 (×5): 72000 [IU] via ORAL
  Filled 2022-03-05 (×2): qty 2
  Filled 2022-03-05: qty 6
  Filled 2022-03-05 (×8): qty 2

## 2022-03-05 MED ORDER — HYDROMORPHONE HCL 2 MG/ML IJ SOLN
2.0000 mg | Freq: Once | INTRAMUSCULAR | Status: AC
  Administered 2022-03-05: 2 mg via INTRAVENOUS
  Filled 2022-03-05: qty 1

## 2022-03-05 MED ORDER — POTASSIUM CHLORIDE CRYS ER 20 MEQ PO TBCR: 40.0000 meq | EXTENDED_RELEASE_TABLET | ORAL | Status: DC

## 2022-03-05 MED ORDER — HYDROMORPHONE HCL 1 MG/ML IJ SOLN: 1.0000 mg | Freq: Once | INTRAMUSCULAR | Status: DC

## 2022-03-05 MED ORDER — HYDROMORPHONE HCL 1 MG/ML IJ SOLN
2.0000 mg | INTRAMUSCULAR | Status: DC | PRN
  Administered 2022-03-05 – 2022-03-06 (×3): 2 mg via INTRAVENOUS
  Filled 2022-03-05 (×4): qty 2

## 2022-03-05 MED ORDER — POTASSIUM CHLORIDE 10 MEQ/100ML IV SOLN
10.0000 meq | INTRAVENOUS | Status: AC
  Administered 2022-03-05 (×2): 10 meq via INTRAVENOUS
  Filled 2022-03-05: qty 100

## 2022-03-05 MED ORDER — POTASSIUM CHLORIDE 10 MEQ/100ML IV SOLN
INTRAVENOUS | Status: AC
  Filled 2022-03-05: qty 100

## 2022-03-05 MED ORDER — MORPHINE SULFATE (PF) 2 MG/ML IV SOLN
2.0000 mg | INTRAVENOUS | Status: DC | PRN
  Administered 2022-03-06 (×3): 2 mg via INTRAVENOUS
  Filled 2022-03-05 (×3): qty 1

## 2022-03-05 MED ORDER — POTASSIUM CHLORIDE 10 MEQ/100ML IV SOLN
10.0000 meq | INTRAVENOUS | Status: AC
  Administered 2022-03-05 (×4): 10 meq via INTRAVENOUS
  Filled 2022-03-05 (×4): qty 100

## 2022-03-05 MED ORDER — AMLODIPINE BESYLATE 10 MG PO TABS
10.0000 mg | ORAL_TABLET | Freq: Every day | ORAL | Status: DC
  Administered 2022-03-05 – 2022-03-23 (×20): 10 mg via ORAL
  Filled 2022-03-05 (×4): qty 1
  Filled 2022-03-05: qty 2
  Filled 2022-03-05 (×15): qty 1

## 2022-03-05 MED ORDER — PANTOPRAZOLE SODIUM 40 MG PO TBEC
40.0000 mg | DELAYED_RELEASE_TABLET | Freq: Two times a day (BID) | ORAL | Status: DC
  Administered 2022-03-05 (×2): 40 mg via ORAL
  Filled 2022-03-05 (×2): qty 1

## 2022-03-05 MED ORDER — ONDANSETRON HCL 4 MG/2ML IJ SOLN
4.0000 mg | Freq: Four times a day (QID) | INTRAMUSCULAR | Status: DC | PRN
  Administered 2022-03-05 – 2022-03-08 (×7): 4 mg via INTRAVENOUS
  Filled 2022-03-05 (×7): qty 2

## 2022-03-05 MED ORDER — INSULIN ASPART 100 UNIT/ML IJ SOLN: 0.0000 [IU] | Freq: Every day | INTRAMUSCULAR | Status: DC

## 2022-03-05 MED ORDER — POTASSIUM CHLORIDE CRYS ER 20 MEQ PO TBCR
40.0000 meq | EXTENDED_RELEASE_TABLET | ORAL | Status: DC
  Filled 2022-03-05: qty 2

## 2022-03-05 MED ORDER — INSULIN ASPART 100 UNIT/ML IJ SOLN
0.0000 [IU] | Freq: Three times a day (TID) | INTRAMUSCULAR | Status: DC
  Administered 2022-03-06 – 2022-03-07 (×3): 2 [IU] via SUBCUTANEOUS
  Administered 2022-03-07 – 2022-03-09 (×4): 1 [IU] via SUBCUTANEOUS
  Administered 2022-03-10 – 2022-03-11 (×2): 2 [IU] via SUBCUTANEOUS
  Administered 2022-03-11 – 2022-03-13 (×3): 1 [IU] via SUBCUTANEOUS
  Administered 2022-03-13: 2 [IU] via SUBCUTANEOUS
  Administered 2022-03-13 – 2022-03-16 (×6): 1 [IU] via SUBCUTANEOUS

## 2022-03-05 MED ORDER — PROCHLORPERAZINE EDISYLATE 10 MG/2ML IJ SOLN
5.0000 mg | Freq: Four times a day (QID) | INTRAMUSCULAR | Status: DC | PRN
  Administered 2022-03-06 – 2022-03-10 (×8): 5 mg via INTRAVENOUS
  Filled 2022-03-05 (×11): qty 2

## 2022-03-05 MED ORDER — POTASSIUM CHLORIDE IN NACL 20-0.9 MEQ/L-% IV SOLN
INTRAVENOUS | Status: DC
  Filled 2022-03-05 (×17): qty 1000

## 2022-03-05 MED ORDER — POTASSIUM CHLORIDE CRYS ER 20 MEQ PO TBCR
40.0000 meq | EXTENDED_RELEASE_TABLET | ORAL | Status: AC
  Administered 2022-03-05 (×2): 40 meq via ORAL
  Filled 2022-03-05: qty 2

## 2022-03-05 MED ORDER — CHLORHEXIDINE GLUCONATE CLOTH 2 % EX PADS
6.0000 | MEDICATED_PAD | Freq: Every day | CUTANEOUS | Status: DC
Start: 2022-03-06 — End: 2022-03-24
  Administered 2022-03-06 – 2022-03-24 (×17): 6 via TOPICAL

## 2022-03-05 MED ORDER — HYDROMORPHONE HCL 2 MG/ML IJ SOLN
2.0000 mg | INTRAMUSCULAR | Status: DC | PRN
  Administered 2022-03-05 (×2): 2 mg via INTRAVENOUS
  Filled 2022-03-05 (×3): qty 1

## 2022-03-05 MED ORDER — IRBESARTAN 75 MG PO TABS
37.5000 mg | ORAL_TABLET | Freq: Every day | ORAL | Status: DC
  Administered 2022-03-05 – 2022-03-07 (×3): 37.5 mg via ORAL
  Filled 2022-03-05 (×2): qty 0.5
  Filled 2022-03-05: qty 1
  Filled 2022-03-05: qty 0.5

## 2022-03-05 MED ORDER — APIXABAN 5 MG PO TABS
5.0000 mg | ORAL_TABLET | Freq: Two times a day (BID) | ORAL | Status: DC
  Administered 2022-03-05 – 2022-03-06 (×4): 5 mg via ORAL
  Filled 2022-03-05 (×4): qty 1

## 2022-03-05 MED ORDER — LABETALOL HCL 5 MG/ML IV SOLN: 10.0000 mg | INTRAVENOUS | Status: DC | PRN

## 2022-03-05 MED ORDER — ATORVASTATIN CALCIUM 20 MG PO TABS
20.0000 mg | ORAL_TABLET | Freq: Every day | ORAL | Status: DC
  Administered 2022-03-05: 20 mg via ORAL
  Filled 2022-03-05: qty 1

## 2022-03-05 NOTE — Progress Notes (Signed)
PROGRESS NOTE  Melanie Alvarez LOV:[id number] DOB: 1955/10/03   PCP: Flossie Buffy, NP  Patient is from: Home.  Lives with husband.  Independently ambulates at baseline.  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  Lab work at the GI office concerning for hyponatremia and hypokalemia, and patient was directed to ED.  Of note, patient was off Eliquis for 2 weeks.  She did not know she was supposed to continue after starter pack.  In ED, Na 122.  K2.8.  CT abdomen and pelvis showed no acute finding but stable mild intra and extrahepatic biliary ductal dilation with palliative metabolic biliary stent in place, stable pancreatic mass with occlusion of the mesenteric and splenic vein.    Patient was started on IV fluid and hyponatremia corrected to 131 quickly.  IV fluid discontinued.   Subjective: Seen and examined earlier this morning.  No major events overnight of this morning.  Continues to endorse severe pain across upper abdomen.  She rates her pain 9/10 but not able to describe.  She also reports nausea and vomiting.  She is dry heaving while I am at bedside.  She reports loose bowel movement yesterday.  She denies melena or hematochezia.   Objective: Vitals:   03/05/22 1200 03/05/22 1230 03/05/22 1402 03/05/22 1441  BP: (!) 142/96 (!) 142/96 (!) 142/96 (!) 160/84  Pulse: (!) 112 (!) 112 91 90  Resp:  '16 16 18  '$ Temp:   98.4 F (36.9 C) 98.1 F (36.7 C)  TempSrc:      SpO2:  99% 100% 100%  Weight:      Height:        Examination:  GENERAL: Seems to be in distress from dry  heaving HEENT: MMM.  Vision and hearing grossly intact.  NECK: Supple.  No apparent JVD.  RESP:  No IWOB.  Fair aeration bilaterally. CVS:  RRR. Heart sounds normal.  ABD/GI/GU: BS+. Abd soft.  Tenderness across upper abdomen. MSK/EXT:  Moves extremities. No apparent deformity. No edema.  SKIN: no apparent skin lesion or wound NEURO: Awake, alert and oriented appropriately.  No apparent focal neuro deficit. PSYCH: Calm. Normal affect.   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  Last chemo seems to be on 12/24/2021.  Lipase and LFTs within normal.  CT abdomen without acute finding but chronic intra and extra biliary ductal dilation with pancreatic mass.  Still with nausea, vomiting and abdominal pain. -Restarted IV fluids -Antiemetics, Zofran and Compazine -Increase PPI to twice daily -Add Carafate 4 times daily. -Analgesics -Will consult [facility] if no improvement  Hyponatremia: Na 122>> 131. Likely due to dehydration in the setting of the above -Restart IV fluid  Persistent hypokalemia: K remain low at 3.2 after a total of 100 mEq.  Mg 2.3. 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  Patient thought she did not need Eliquis and stopped taking after starter pack. -Restarted Eliquis   Malignant neoplasm of pancreas: Followed by Dr. Marin Olp.  On systemic chemo. 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 -Continue monitoring  NIDDM-2  with hyperglycemia: A1c 7.5% on 11/27/2021.  On glipizide and metformin at home. -Start SSI-sensitive and CBG monitoring  History of hepatic abscess-treated and resolved.  Status post drain removal on 6/29.  Hypertension -Continue amlodipine, ACE inhibitor  Body mass index is 26.57 kg/m.           DVT prophylaxis:   apixaban (ELIQUIS) tablet 5 mg  Code Status: Full code Family Communication: None at bedside Level of care: Telemetry Status is: Inpatient Remains inpatient appropriate because: Intractable nausea, vomiting and abdominal pain, hyponatremia, hypokalemia and dehydration   Final disposition: TBD Consultants:  Oncology  Sch Meds:  Scheduled Meds:  amLODipine  10 mg Oral QHS   apixaban  5 mg Oral BID   atorvastatin  20 mg Oral QHS   [START ON 03/06/2022] Chlorhexidine Gluconate Cloth  6 each Topical Daily   insulin aspart  0-5 Units Subcutaneous QHS   [START ON 03/06/2022] insulin aspart  0-9 Units Subcutaneous TID WC   irbesartan  37.5 mg Oral Daily   lipase/protease/amylase  72,000 Units Oral TID AC   pantoprazole  40 mg Oral BID   sucralfate  1 g Oral TID WC & HS   Continuous Infusions:  0.9 % NaCl with KCl 20 mEq / L Stopped (03/05/22 1440)   potassium chloride     PRN Meds:.HYDROmorphone (DILAUDID) injection, labetalol, morphine injection, ondansetron (ZOFRAN) IV, mouth rinse, prochlorperazine  Antimicrobials: Anti-infectives (From admission, onward)    None        I have personally reviewed the following labs and images: CBC: Recent Labs  Lab 03/04/22 1136 03/04/22 1449  WBC 8.0 8.5  NEUTROABS 6.7 6.7  HGB 12.1 11.3*  HCT 35.7* 33.5*  MCV 84.9 83.1  PLT 280.0 258   BMP &GFR Recent Labs  Lab 03/04/22 1136 03/04/22 1449 03/04/22 2247 03/05/22 0500 03/05/22 1518  NA 122* 124* 131* 132* 131*  K 2.8* 2.8* 3.0* 3.1* 3.2*  CL 81* 84* 92* 94* 94*  CO2 '29 28 28 28 25  '$ GLUCOSE 216* 139* 104* 93 134*  BUN 6 5* 5* <5* <5*  CREATININE  0.62 0.53 0.41* 0.43* 0.46  CALCIUM 9.7 9.0 9.0 9.2 9.3  MG  --  2.0  --  2.3  --   PHOS  --   --   --   --  3.4   Estimated Creatinine Clearance: 65.7 mL/min (by C-G formula based on SCr of 0.46 mg/dL). Liver & Pancreas: Recent Labs  Lab 03/04/22 1136 03/04/22 1449 03/05/22 1518  AST 26 24  --   ALT 20 22  --   ALKPHOS 72 64  --   BILITOT 0.7 0.9  --   PROT 7.9 7.5  --   ALBUMIN 3.8 3.3* 3.3*   Recent Labs  Lab 03/04/22 1449  LIPASE 21   No results for input(s): "AMMONIA" in the last 168 hours. Diabetic: No results for input(s): "HGBA1C" in the last 72 hours. No results for input(s): "GLUCAP" in the last 168 hours. Cardiac Enzymes: No results for input(s): "CKTOTAL", "CKMB", "CKMBINDEX", "TROPONINI" in the  last 168 hours. No results for input(s): "PROBNP" in the last 8760 hours. Coagulation Profile: Recent Labs  Lab 03/04/22 1136  INR 1.1*   Thyroid Function Tests: No results for input(s): "TSH", "T4TOTAL", "FREET4", "T3FREE", "THYROIDAB" in the last 72 hours. Lipid Profile: No results for input(s): "CHOL", "HDL", "LDLCALC", "TRIG", "CHOLHDL", "LDLDIRECT" in the last 72 hours. Anemia Panel: No results for input(s): "VITAMINB12", "FOLATE", "FERRITIN", "TIBC", "IRON", "RETICCTPCT" in the last 72 hours. Urine analysis:    Component Value Date/Time   COLORURINE YELLOW 02/15/2022 1059   APPEARANCEUR CLEAR 02/15/2022 1059   LABSPEC 1.013 02/15/2022 1059   PHURINE 7.0 02/15/2022 1059   GLUCOSEU NEGATIVE 02/15/2022 1059   HGBUR SMALL (A) 02/15/2022 1059   BILIRUBINUR NEGATIVE 02/15/2022 1059   BILIRUBINUR NEGATIVE 10/22/2018 1556   [place] 02/15/2022 1059   PROTEINUR NEGATIVE 02/15/2022 1059   UROBILINOGEN 0.2 10/22/2018 1556   NITRITE NEGATIVE 02/15/2022 1059   LEUKOCYTESUR SMALL (A) 02/15/2022 1059   Sepsis Labs: Invalid input(s): "PROCALCITONIN", "LACTICIDVEN"  Microbiology: No results found for this or any previous visit (from the past 240  hour(s)).  Radiology Studies: CT ABDOMEN PELVIS W CONTRAST  Result Date: 03/04/2022 CLINICAL DATA:  Nausea, vomiting, unspecified abdominal pain EXAM: CT ABDOMEN AND PELVIS WITH CONTRAST TECHNIQUE: Multidetector CT imaging of the abdomen and pelvis was performed using the standard protocol following bolus administration of intravenous contrast. RADIATION DOSE REDUCTION: This exam was performed according to the departmental dose-optimization program which includes automated exposure control, adjustment of the mA and/or kV according to patient size and/or use of iterative reconstruction technique. CONTRAST:  11m OMNIPAQUE IOHEXOL 300 MG/ML  SOLN COMPARISON:  02/14/2022 FINDINGS: Lower chest: No acute abnormality. Hepatobiliary: Palliative metallic biliary stent within the common duct extending through the ampulla. Pneumobilia is in keeping with patency of the a stented segment. Mild intra and extrahepatic biliary ductal dilation is unchanged. Previously noted hepatic abscess drainage catheter has been removed in the interval. Small wedge like region of non enhancement within the left hepatic lobe likely represents a small amount of residual scarring. No focal intrahepatic mass. Gas within the gallbladder lumen again noted. The gallbladder is not distended no pericholecystic inflammatory changes identified. Pancreas: Partially calcified pancreatic head mass is again seen, grossly unchanged from prior examination but poorly delineated on this examination. Dilation of the pancreatic duct distal to the mass is again seen with associated pancreatic parenchymal atrophy. No superimposed peripancreatic inflammatory changes. Spleen: Unremarkable Adrenals/Urinary Tract: The adrenal glands are unremarkable. The kidneys are normal in size and position. 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 The kidneys are otherwise unremarkable. The bladder is unremarkable. Stomach/Bowel:  Stomach is within normal limits. Appendix appears normal. No evidence of bowel wall thickening, distention, or inflammatory changes. No free intraperitoneal gas or fluid. Vascular/Lymphatic: Occlusion of the terminal superior mesenteric vein and splenic vein is again identified in the region of the pancreatic mass. The main portal vein remains patent, reconstituted via the left gastric vein. Moderate aortoiliac atherosclerotic calcification. No aortic aneurysm. No pathologic adenopathy within the abdomen and pelvis. Stable shotty periceliac adenopathy, nonspecific, measuring up to 9 mm in short axis diameter at axial image # 20/2. Reproductive: Uterus and bilateral adnexa are unremarkable. Other: No abdominal wall hernia Musculoskeletal: No acute bone abnormality. No lytic or blastic bone lesion. IMPRESSION: 1. No acute intra-abdominal pathology identified. No definite radiographic explanation for the patient's reported symptoms. 2. Interval removal of hepatic abscess drainage catheter. No recurrent abscess  identified. Wedge like region of probable fibrosis in the area of prior abscess. 3. Stable mild intra and extrahepatic biliary ductal dilation. Palliative metallic biliary stent remains in place. Pneumobilia in keeping with patency of the stented segment. 4. Grossly stable pancreatic head mass with associated occlusion of the terminal superior mesenteric vein and splenic vein. 5. 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 Electronically Signed   By: Fidela Salisbury M.D.   On: 03/04/2022 19:55      Tkeyah Burkman T. [place]  If 7PM-7AM, please contact night-coverage [email or web address] 03/05/2022, 5:10 PM

## 2022-03-05 NOTE — Assessment & Plan Note (Signed)
Compensated 

## 2022-03-05 NOTE — Assessment & Plan Note (Signed)
Given IV K 47mq x3 in ED. Repeat remains low, will give addition x 2.

## 2022-03-05 NOTE — Assessment & Plan Note (Signed)
-  Continue Creon °

## 2022-03-05 NOTE — Assessment & Plan Note (Signed)
Presented with sodium of 122.  This is corrected to 131 following 1 L of LR in the ED. This is around her baseline. Hold on anymore fluids tonight due to rapid correction.

## 2022-03-05 NOTE — Assessment & Plan Note (Signed)
Continue amlodipine, ACE inhibitor

## 2022-03-05 NOTE — ED Notes (Signed)
Patient ready for transport to floor bed.

## 2022-03-05 NOTE — Telephone Encounter (Signed)
1st no show, fee waived ?

## 2022-03-05 NOTE — H&P (Signed)
History and Physical    Patient: Melanie Alvarez SVX:[id number] DOB: [birth year] DOA: 03/04/2022 DOS: the patient was seen and examined on 03/05/2022 PCP: Flossie Buffy, NP  Patient coming from: Home  Chief Complaint:  Chief Complaint  Patient presents with   Abnormal Labs   HPI: Melanie Alvarez is a 66 y.o. 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  She is a limited historian.  Says she has been having nausea, vomiting, diarrhea and abdominal pain for "a while."  Has also been off her Eliquis for 2 weeks.  She saw GI earlier today and had lab work returning with electrolyte abnormalities and was advised to present to the ED.  In the ED, she was afebrile normotensive on room air.No leukocytosis, mild anemia with hemoglobin 11.3. Sodium of 122, potassium of 2.8, glucose of 216, creatinine is normal at 0.62.  CT abdomen pelvis showed no acute intra abdominal pathology.  Stable mild intra and extrahepatic biliary ductal dilatation with palliative metallic biliary stent in place.  Stable pancreatic mass with occlusion of the mesenteric and splenic vein. Review of Systems: As mentioned in the history of present illness. All other systems reviewed and are negative. Past Medical History:  Diagnosis Date   Cancer ([place])    Cirrhosis ([place])    Diabetes mellitus 2012   Diverticulosis    Gallbladder sludge    Hepatitis 2010   history of Hepatitis C   Hepatitis C    Hypertension 2011   Obesity    Past Surgical History:  Procedure Laterality Date   BILIARY BRUSHING  09/22/2019   Procedure: BILIARY BRUSHING;  Surgeon: Irving Copas., MD;  Location: [facility];  Service: Gastroenterology;;   BILIARY DILATION   09/22/2019   Procedure: BILIARY DILATION;  Surgeon: Irving Copas., MD;  Location: [facility];  Service: Gastroenterology;;   BILIARY STENT PLACEMENT N/A 09/22/2019   Procedure: BILIARY STENT PLACEMENT;  Surgeon: Irving Copas., MD;  Location: [facility];  Service: Gastroenterology;  Laterality: N/A;   COLONOSCOPY  07/24/2011   Procedure: COLONOSCOPY;  Surgeon: Landry Dyke, MD;  Location: [facility];  Service: Endoscopy;  Laterality: N/A;   Dental Surgery Tooth Extraction     ERCP N/A 09/22/2019   Procedure: ENDOSCOPIC RETROGRADE CHOLANGIOPANCREATOGRAPHY (ERCP);  Surgeon: Irving Copas., MD;  Location: [facility];  Service: Gastroenterology;  Laterality: N/A;   ESOPHAGOGASTRODUODENOSCOPY (EGD) WITH PROPOFOL N/A 09/22/2019   Procedure: ESOPHAGOGASTRODUODENOSCOPY (EGD) WITH PROPOFOL;  Surgeon: Rush Landmark Telford Nab., MD;  Location: [facility];  Service: Gastroenterology;  Laterality: N/A;   ESOPHAGOGASTRODUODENOSCOPY (EGD) WITH PROPOFOL N/A 09/25/2019   Procedure: ESOPHAGOGASTRODUODENOSCOPY (EGD) WITH PROPOFOL;  Surgeon: Rush Landmark Telford Nab., MD;  Location: [facility];  Service: Gastroenterology;  Laterality: N/A;   ESOPHAGOGASTRODUODENOSCOPY (EGD) WITH PROPOFOL N/A 01/19/2020   Procedure: ESOPHAGOGASTRODUODENOSCOPY (EGD) WITH PROPOFOL;  Surgeon: Rush Landmark Telford Nab., MD;  Location: [place];  Service: Gastroenterology;  Laterality: N/A;   EUS N/A 09/22/2019   Procedure: UPPER ENDOSCOPIC ULTRASOUND (EUS) RADIAL;  Surgeon: Irving Copas., MD;  Location: [facility];  Service: Gastroenterology;  Laterality: N/A;   EUS N/A 01/19/2020   Procedure: UPPER ENDOSCOPIC ULTRASOUND (EUS) RADIAL;  Surgeon: Irving Copas., MD;  Location: [place];  Service: Gastroenterology;  Laterality: N/A;   FIDUCIAL MARKER PLACEMENT N/A 01/19/2020  Procedure: FIDUCIAL MARKER PLACEMENT;  Surgeon: Irving Copas., MD;  Location: [place];  Service:  Gastroenterology;  Laterality: N/A;   FINE NEEDLE ASPIRATION  09/22/2019   Procedure: FINE NEEDLE ASPIRATION (FNA) LINEAR;  Surgeon: Irving Copas., MD;  Location: [facility];  Service: Gastroenterology;;   FINE NEEDLE ASPIRATION  09/25/2019   Procedure: FINE NEEDLE ASPIRATION (FNA) RADIAL;  Surgeon: Irving Copas., MD;  Location: [facility];  Service: Gastroenterology;;   HEMOSTASIS CLIP PLACEMENT  09/25/2019   Procedure: HEMOSTASIS CLIP PLACEMENT;  Surgeon: Irving Copas., MD;  Location: [facility];  Service: Gastroenterology;;   IR CATHETER TUBE CHANGE  01/31/2022   IR CHOLANGIOGRAM EXISTING TUBE  02/11/2022   IR IMAGING GUIDED PORT INSERTION  09/24/2019   IR IMAGING GUIDED PORT INSERTION  12/31/2021   IR RADIOLOGIST EVAL & MGMT  01/30/2022   IR RADIOLOGIST EVAL & MGMT  02/14/2022   IR REMOVAL TUN ACCESS W/ PORT W/O FL MOD SED  06/16/2020   SPHINCTEROTOMY  09/22/2019   Procedure: SPHINCTEROTOMY;  Surgeon: Irving Copas., MD;  Location: [facility];  Service: Gastroenterology;;   UPPER ESOPHAGEAL ENDOSCOPIC ULTRASOUND (EUS) N/A 09/25/2019   Procedure: UPPER ESOPHAGEAL ENDOSCOPIC ULTRASOUND (EUS);  Surgeon: Irving Copas., MD;  Location: [facility];  Service: Gastroenterology;  Laterality: N/A;   Social History:  reports that she quit smoking about 2 years ago. Her smoking use included cigarettes. She smoked an average of .25 packs per day. She has never used smokeless tobacco. She reports that she does not currently use alcohol. She reports that she does not use drugs.  Allergies  Allergen Reactions   Lisinopril Swelling and Other (See Comments)    Facial and upper lip    Family History  Problem Relation Age of Onset   Diabetes Sister    Cancer Maternal Grandmother        leukemia    Diabetes Maternal Grandfather    Diabetes Paternal Grandfather    Diabetes Paternal Grandmother    Hypertension Sister    Hypertension Brother    Anesthesia  problems Neg Hx     Prior to Admission medications   Medication Sig Start Date End Date Taking? Authorizing Provider  amLODipine (NORVASC) 10 MG tablet Take 1 tablet (10 mg total) by mouth at bedtime. 11/27/21  Yes Nche, Charlene Brooke, NP  apixaban (ELIQUIS) 5 MG TABS tablet Take 1 tablet (5 mg total) by mouth 2 (two) times daily. 03/04/22  Yes Vicie Mutters R, PA-C  atorvastatin (LIPITOR) 20 MG tablet Take 1 tablet (20 mg total) by mouth at bedtime. 01/25/21  Yes Nche, Charlene Brooke, NP  CREON 36000-114000 units CPEP capsule TAKE 2 CAPSULES BY MOUTH THREE TIMES DAILY BEFORE MEAL(S) Patient taking differently: Take 72,000 Units by mouth 3 (three) times daily before meals. 02/01/22  Yes Volanda Napoleon, MD  glipiZIDE (GLUCOTROL XL) 5 MG 24 hr tablet Take 1 tablet (5 mg total) by mouth daily with breakfast. 09/14/21  Yes Nche, Charlene Brooke, NP  HYDROmorphone (DILAUDID) 4 MG tablet Take 1 tablet (4 mg total) by mouth every 4 (four) hours as needed for severe pain. 03/04/22  Yes Ennever, Rudell Cobb, MD  olmesartan (BENICAR) 5 MG tablet Take 1 tablet by mouth once daily 02/11/22  Yes Nche, Charlene Brooke, NP  ALPRAZolam (XANAX) 1 MG tablet Take 0.5 tablets (0.5 mg total) by mouth every 8 (eight) hours as needed for anxiety. 02/11/22   Volanda Napoleon, MD  aluminum-magnesium hydroxide-simethicone ([place]) 200-200-20 MG/5ML  SUSP Take 30 mLs by mouth 4 (four) times daily -  before meals and at bedtime. Patient not taking: Reported on 03/04/2022 01/25/22   Sherwood Gambler, MD  fentaNYL (DURAGESIC) 25 MCG/HR Place 1 patch onto the skin every 3 (three) days. Patient not taking: Reported on 03/04/2022 01/02/22   Volanda Napoleon, MD  fluticasone [facility]) 50 MCG/ACT nasal spray Place 2 sprays into both nostrils daily. Patient not taking: Reported on 03/04/2022 05/14/21   Tower, Roque Lias A, MD  glucose blood test strip Test blood glucose once daily. One Touch Verio test strips Patient not taking: Reported on 03/04/2022 09/28/19    Nche, Charlene Brooke, NP  lidocaine-prilocaine (EMLA) cream Apply to affected area once Patient not taking: Reported on 03/04/2022 12/26/21   Volanda Napoleon, MD  metFORMIN (GLUCOPHAGE XR) 750 MG 24 hr tablet Take 1 tablet (750 mg total) by mouth daily with breakfast. Patient not taking: Reported on 03/04/2022 09/14/21   Nche, Charlene Brooke, NP  metoprolol succinate (TOPROL-XL) 25 MG 24 hr tablet TAKE 1 TABLET BY MOUTH ONCE DAILY Patient not taking: Reported on 03/04/2022 01/25/21   Nche, Charlene Brooke, NP  ondansetron (ZOFRAN) 8 MG tablet Take 1 tablet (8 mg total) by mouth 2 (two) times daily as needed (Nausea or vomiting). 03/04/22   Vladimir Crofts, PA-C  pantoprazole (PROTONIX) 40 MG tablet Take 1 tablet (40 mg total) by mouth 2 (two) times daily for 14 days. Patient not taking: Reported on 01/15/2022 12/04/21 12/18/21  Flossie Buffy, NP  pantoprazole (PROTONIX) 40 MG tablet Take 1 tablet (40 mg total) by mouth daily. 12/24/21   Volanda Napoleon, MD  potassium chloride (KLOR-CON M) 20 MEQ tablet Take 1 tablet (20 mEq total) by mouth 2 (two) times daily. Patient not taking: Reported on 01/15/2022 11/27/21   Nche, Charlene Brooke, NP  prochlorperazine (COMPAZINE) 10 MG tablet Take 1 tablet (10 mg total) by mouth every 6 (six) hours as needed (Nausea or vomiting). Patient not taking: Reported on 03/04/2022 12/26/21   Volanda Napoleon, MD  sucralfate (CARAFATE) 1 g tablet Take 1 tablet (1 g total) by mouth 2 (two) times daily. 03/04/22   Vladimir Crofts, PA-C  sulfamethoxazole-trimethoprim (BACTRIM DS) 800-160 MG tablet Take 1 tablet by mouth 2 (two) times daily. Patient not taking: Reported on 03/04/2022 02/18/22   [provider]    Physical Exam: Vitals:   03/04/22 2115 03/04/22 2200 03/05/22 0007 03/05/22 0106  BP: (!) 146/81 140/83  140/85  Pulse: 97 87  (!) 101  Resp: '16 16  17  '$ Temp:   98.2 F (36.8 C)   TempSrc:   Oral   SpO2: 98% 100%  94%  Weight:      Height:        Constitutional: NAD, calm, comfortable, elderly female laying in bed asleep Eyes: lids and conjunctivae normal ENMT: Mucous membranes are moist.  Neck: normal, supple,  Respiratory: clear to auscultation bilaterally, no wheezing, no crackles. Normal respiratory effort. No accessory muscle use.  Cardiovascular: Regular rate and rhythm, no murmurs / rubs / gallops. No extremity edema.  Abdomen: Soft, nondistended, diffuse mild abdominal tenderness.  No guarding, rigidity or rebound tenderness. Bowel sounds positive.  Musculoskeletal: no clubbing / cyanosis. No joint deformity upper and lower extremities. Good ROM, no contractures. Normal muscle tone.  Skin: no rashes, lesions, ulcers.  Neurologic: CN 2-12 grossly intact. Strength 5/5 in all 4.  Psychiatric: Normal judgment and insight. Alert and oriented x 3. Normal  mood. Data Reviewed:  See HPI  Assessment and Plan: * Hyponatremia Presented with sodium of 122.  This is corrected to 131 following 1 L of LR in the ED. This is around her baseline. Hold on anymore fluids tonight due to rapid correction.   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  Has previous history of portal vein and mesenteric thrombosis. -Resume Eliquis  Pancreatic insufficiency Continue Creon  Malignant neoplasm of pancreas ([place]) Follows with oncology Dr. Marin Olp -on systemic chemotherapy  Cirrhosis of liver ([place]) Compensated  Hypokalemia Given IV K 79mq x3 in ED. Repeat remains low, will give addition x 2.  Hypertension Continue amlodipine, ACE inhibitor      Advance Care Planning:   Code Status: Full Code full-confirmed with patient at bedside  Consults: none  Family Communication: No family at bedside  Severity of Illness: The appropriate patient status for this patient is OBSERVATION. Observation status is judged to be reasonable and necessary in order to provide the required  intensity of service to ensure the patient's safety. The patient's presenting symptoms, physical exam findings, and initial radiographic and laboratory data in the context of their medical condition is felt to place them at decreased risk for further clinical deterioration. Furthermore, it is anticipated that the patient will be medically stable for discharge from the hospital within 2 midnights of admission.   Author: COrene Desanctis DO 03/05/2022 2:09 AM  For on call review [email or web address]

## 2022-03-05 NOTE — Assessment & Plan Note (Signed)
Splenic vein thrombus -Patient has been off her Eliquis for 2 weeks.  Has previous history of portal vein and mesenteric thrombosis. -Resume Eliquis

## 2022-03-05 NOTE — Assessment & Plan Note (Signed)
Follows with oncology Dr. Marin Olp -on systemic chemotherapy

## 2022-03-06 DIAGNOSIS — K7460 Unspecified cirrhosis of liver: Secondary | ICD-10-CM | POA: Diagnosis not present

## 2022-03-06 DIAGNOSIS — I82890 Acute embolism and thrombosis of other specified veins: Secondary | ICD-10-CM | POA: Diagnosis not present

## 2022-03-06 DIAGNOSIS — C250 Malignant neoplasm of head of pancreas: Secondary | ICD-10-CM | POA: Diagnosis not present

## 2022-03-06 DIAGNOSIS — R112 Nausea with vomiting, unspecified: Secondary | ICD-10-CM | POA: Diagnosis not present

## 2022-03-06 DIAGNOSIS — K55069 Acute infarction of intestine, part and extent unspecified: Secondary | ICD-10-CM | POA: Diagnosis not present

## 2022-03-06 DIAGNOSIS — E876 Hypokalemia: Secondary | ICD-10-CM | POA: Diagnosis not present

## 2022-03-06 DIAGNOSIS — R1013 Epigastric pain: Secondary | ICD-10-CM

## 2022-03-06 DIAGNOSIS — E871 Hypo-osmolality and hyponatremia: Secondary | ICD-10-CM | POA: Diagnosis not present

## 2022-03-06 LAB — CBC
HCT: 33.5 % — ABNORMAL LOW (ref 36.0–46.0)
Hemoglobin: 11.2 g/dL — ABNORMAL LOW (ref 12.0–15.0)
MCH: 28.7 pg (ref 26.0–34.0)
MCHC: 33.4 g/dL (ref 30.0–36.0)
MCV: 85.9 fL (ref 80.0–100.0)
Platelets: 261 10*3/uL (ref 150–400)
RBC: 3.9 MIL/uL (ref 3.87–5.11)
RDW: 16.3 % — ABNORMAL HIGH (ref 11.5–15.5)
WBC: 7.9 10*3/uL (ref 4.0–10.5)
nRBC: 0 % (ref 0.0–0.2)

## 2022-03-06 LAB — RENAL FUNCTION PANEL
Albumin: 3 g/dL — ABNORMAL LOW (ref 3.5–5.0)
Anion gap: 9 (ref 5–15)
BUN: <5 mg/dL — ABNORMAL LOW (ref 8–23)
CO2: 24 mmol/L (ref 22–32)
Calcium: 8.5 mg/dL — ABNORMAL LOW (ref 8.9–10.3)
Chloride: 93 mmol/L — ABNORMAL LOW (ref 98–111)
Creatinine, Ser: 0.39 mg/dL — ABNORMAL LOW (ref 0.44–1.00)
GFR, Estimated: >60 mL/min (ref >60)
Glucose, Bld: 157 mg/dL — ABNORMAL HIGH (ref 70–99)
Phosphorus: 3.1 mg/dL (ref 2.5–4.6)
Potassium: 3.8 mmol/L (ref 3.5–5.1)
Sodium: 126 mmol/L — ABNORMAL LOW (ref 135–145)

## 2022-03-06 LAB — MAGNESIUM: Magnesium: 1.4 mg/dL — ABNORMAL LOW (ref 1.7–2.4)

## 2022-03-06 LAB — GLUCOSE, CAPILLARY
Glucose-Capillary: 155 mg/dL — ABNORMAL HIGH (ref 70–99)
Glucose-Capillary: 163 mg/dL — ABNORMAL HIGH (ref 70–99)
Glucose-Capillary: 118 mg/dL — ABNORMAL HIGH (ref 70–99)
Glucose-Capillary: 151 mg/dL — ABNORMAL HIGH (ref 70–99)

## 2022-03-06 LAB — SODIUM, URINE, RANDOM: Sodium, Ur: 76 mmol/L

## 2022-03-06 LAB — CREATININE, URINE, RANDOM: Creatinine, Urine: 12 mg/dL

## 2022-03-06 MED ORDER — PANTOPRAZOLE SODIUM 40 MG PO TBEC
40.0000 mg | DELAYED_RELEASE_TABLET | Freq: Two times a day (BID) | ORAL | Status: DC
  Administered 2022-03-06 – 2022-03-24 (×32): 40 mg via ORAL
  Filled 2022-03-06 (×34): qty 1

## 2022-03-06 MED ORDER — PANTOPRAZOLE SODIUM 40 MG PO TBEC
80.0000 mg | DELAYED_RELEASE_TABLET | Freq: Two times a day (BID) | ORAL | Status: DC
  Administered 2022-03-06: 80 mg via ORAL
  Filled 2022-03-06: qty 2

## 2022-03-06 MED ORDER — HYDROMORPHONE HCL 1 MG/ML IJ SOLN
0.5000 mg | INTRAMUSCULAR | Status: DC | PRN
  Administered 2022-03-06 – 2022-03-11 (×30): 1 mg via INTRAVENOUS
  Filled 2022-03-06 (×31): qty 1

## 2022-03-06 MED ORDER — APIXABAN 5 MG PO TABS
5.0000 mg | ORAL_TABLET | Freq: Two times a day (BID) | ORAL | Status: DC
  Administered 2022-03-09: 5 mg via ORAL
  Filled 2022-03-06: qty 1

## 2022-03-06 MED ORDER — MAGNESIUM SULFATE 4 GM/100ML IV SOLN
4.0000 g | Freq: Once | INTRAVENOUS | Status: AC
  Administered 2022-03-06: 4 g via INTRAVENOUS
  Filled 2022-03-06: qty 100

## 2022-03-06 MED ORDER — SODIUM CHLORIDE 0.9% FLUSH
10.0000 mL | INTRAVENOUS | Status: DC | PRN
  Administered 2022-03-09 – 2022-03-20 (×5): 10 mL

## 2022-03-06 MED ORDER — SODIUM CHLORIDE 0.9% FLUSH
10.0000 mL | Freq: Two times a day (BID) | INTRAVENOUS | Status: DC
  Administered 2022-03-07 – 2022-03-14 (×7): 10 mL
  Administered 2022-03-15: 20 mL
  Administered 2022-03-15 – 2022-03-23 (×11): 10 mL

## 2022-03-06 MED ORDER — LIP MEDEX EX OINT
TOPICAL_OINTMENT | CUTANEOUS | Status: DC | PRN
  Filled 2022-03-06: qty 7

## 2022-03-06 NOTE — Progress Notes (Signed)
PROGRESS NOTE  Melanie Alvarez [id number]:[id number] DOB: 1956-01-29   PCP: Flossie Buffy, NP  Patient is from: Home.  Lives with husband.  Independently ambulates at baseline.  DOA: 03/04/2022 LOS: 1  Chief complaints Chief Complaint  Patient presents with   Abnormal Labs     Brief Narrative / Interim history: 66 year old F with PMH of liver cirrhosis, treated hep C, recurrent pancreatic cancer with insufficiency on systemic chemo, portal vein and mesenteric vein thrombosis on Eliquis, history of hepatic abscess, DM-2, HTN and HLD presented to her gastroenterologist with nausea, vomiting and abdominal pain.  Lab work at the GI office concerning for hyponatremia and hypokalemia, and patient was directed to ED.  Of note, patient was off Eliquis for 2 weeks.  She did not know she was supposed to continue after starter pack.  In ED, Na 122.  K2.8.  CT abdomen and pelvis showed no acute finding but stable mild intra and extrahepatic biliary ductal dilation with palliative metabolic biliary stent in place, stable pancreatic mass with occlusion of the mesenteric and splenic vein.    Electrolytes improved.  Still with intractable nausea, vomiting and abdominal pain.  Oncology and GI consulted.  Plan for EGD on 7/21 after Eliquis washout.  Subjective: Seen and examined earlier this morning.  No major events overnight of this morning.  Continues to endorse nausea, vomiting and abdominal pain.  Pain is mainly over LUQ.  Not able to describe.  She also reports nausea and vomiting.  Last emesis was last night.  Denies blood in emesis.  Has not a bowel movement yet.  Objective: Vitals:   03/05/22 2119 03/05/22 2322 03/06/22 0115 03/06/22 0307  BP:  (!) 157/92 (!) 133/110   Pulse:  96 90   Resp: '17 19  15  '$ Temp:  98.3 F (36.8 C)    TempSrc:  Oral    SpO2:  97% 100%   Weight:      Height:        Examination:  GENERAL: No apparent distress.  Nontoxic. HEENT: MMM.  Vision and hearing  grossly intact.  NECK: Supple.  No apparent JVD.  RESP:  No IWOB.  Fair aeration bilaterally. CVS:  RRR. Heart sounds normal.  ABD/GI/GU: BS+. Abd soft.  Diffuse tenderness across upper abdomen.  MSK/EXT:  Moves extremities. No apparent deformity. No edema.  SKIN: no apparent skin lesion or wound NEURO: Awake and alert. Oriented appropriately.  No apparent focal neuro deficit. PSYCH: Calm. Normal affect.   Procedures:  None  Microbiology summarized: None  Assessment and plan: Principal Problem:   Hyponatremia Active Problems:   Hypertension   Hypokalemia   Cirrhosis of liver (HCC)   Malignant neoplasm of pancreas (HCC)   Pancreatic insufficiency   Mesenteric vein thrombosis (HCC)   Acute thrombosis of splenic vein   Intractable nausea and vomiting   Abdominal pain, epigastric  Intractable nausea/vomiting/abdominal pain: Likely from underlying pancreatic malignancy.  Also concern about withdrawal from fentanyl patch.  Last chemo seems to be on 12/24/2021.  Lipase and LFTs within normal.  CT abdomen without acute finding but chronic intra and extra biliary ductal dilation with pancreatic mass.  Still with nausea, vomiting and abdominal pain. -Continue IV fluid -Antiemetics, Zofran and Compazine -Continue PPI twice daily -Added Carafate 4 times daily. -Continue IV Dilaudid as needed while in-house. -We will resume Duragesic close to discharge. -Appreciate GI help-plan for EGD after Eliquis washout on Friday. -Clear liquid diet  Hyponatremia: Na 122>> 131>> 126. -Continue  IV fluid  -Check urine chemistry.  Hypokalemia/hypomagnesemia: Hypokalemia resolved.  Mg 1.4 -IV magnesium sulfate 4 g x 1 -Continue IV NS with KCl   Portal and mesenteric vein thrombosis: Recent diagnosis.  Patient thought she did not need Eliquis and stopped taking after starter pack. -Hold Eliquis for EGD.   Malignant neoplasm of pancreas: Followed by Dr. Marin Olp.  On systemic chemo. 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 -Continue monitoring  NIDDM-2 with hyperglycemia: A1c 7.5% on 11/27/2021.  On glipizide and metformin at home. Recent Labs  Lab 03/05/22 2031 03/06/22 0728 03/06/22 1237  GLUCAP 125* 155* 163*  -Continue SSI-sensitive  History of hepatic abscess-treated and resolved.  Status post drain removal on 6/29.  Essential hypertension: BP within acceptable range. -Continue amlodipine, ACE inhibitor  Body mass index is 26.57 kg/m.           DVT prophylaxis:   apixaban (ELIQUIS) tablet 5 mg  Code Status: Full code Family Communication: None at bedside Level of care: Telemetry Status is: Inpatient Remains inpatient appropriate because: Intractable nausea, vomiting and abdominal pain, hyponatremia, hypokalemia and dehydration   Final disposition: TBD Consultants:  Oncology Gastroenterology  Sch Meds:  Scheduled Meds:  amLODipine  10 mg Oral QHS   [START ON 03/09/2022] apixaban  5 mg Oral BID   Chlorhexidine Gluconate Cloth  6 each Topical Daily   insulin aspart  0-5 Units Subcutaneous QHS   insulin aspart  0-9 Units Subcutaneous TID WC   irbesartan  37.5 mg Oral Daily   lipase/protease/amylase  72,000 Units Oral TID AC   pantoprazole  40 mg Oral BID   sodium chloride flush  10-40 mL Intracatheter Q12H   sucralfate  1 g Oral TID WC & HS   Continuous Infusions:  0.9 % NaCl with KCl 20 mEq / L 100 mL/hr at 03/06/22 0747   magnesium sulfate bolus IVPB 4 g (03/06/22 1300)   PRN Meds:.HYDROmorphone (DILAUDID) injection, labetalol, lip balm, morphine injection, ondansetron (ZOFRAN) IV, mouth rinse, prochlorperazine, sodium chloride flush  Antimicrobials: Anti-infectives (From admission, onward)    None        I have personally reviewed the following labs and images: CBC: Recent Labs  Lab 03/04/22 1136 03/04/22 1449 03/06/22 0838  WBC 8.0 8.5  7.9  NEUTROABS 6.7 6.7  --   HGB 12.1 11.3* 11.2*  HCT 35.7* 33.5* 33.5*  MCV 84.9 83.1 85.9  PLT 280.0 258 261   BMP &GFR Recent Labs  Lab 03/04/22 1449 03/04/22 2247 03/05/22 0500 03/05/22 1518 03/06/22 0838  NA 124* 131* 132* 131* 126*  K 2.8* 3.0* 3.1* 3.2* 3.8  CL 84* 92* 94* 94* 93*  CO2 '28 28 28 25 24  '$ GLUCOSE 139* 104* 93 134* 157*  BUN 5* 5* <5* <5* <5*  CREATININE 0.53 0.41* 0.43* 0.46 0.39*  CALCIUM 9.0 9.0 9.2 9.3 8.5*  MG 2.0  --  2.3  --  1.4*  PHOS  --   --   --  3.4 3.1   Estimated Creatinine Clearance: 65.7 mL/min (A) (by C-G formula based on SCr of 0.39 mg/dL (L)). Liver & Pancreas: Recent Labs  Lab 03/04/22 1136 03/04/22 1449 03/05/22 1518 03/06/22 0838  AST 26 24  --   --   ALT 20 22  --   --   ALKPHOS 72 64  --   --   BILITOT 0.7 0.9  --   --  PROT 7.9 7.5  --   --   ALBUMIN 3.8 3.3* 3.3* 3.0*   Recent Labs  Lab 03/04/22 1449  LIPASE 21   No results for input(s): "AMMONIA" in the last 168 hours. Diabetic: No results for input(s): "HGBA1C" in the last 72 hours. Recent Labs  Lab 03/05/22 2031 03/06/22 0728 03/06/22 1237  GLUCAP 125* 155* 163*   Cardiac Enzymes: No results for input(s): "CKTOTAL", "CKMB", "CKMBINDEX", "TROPONINI" in the last 168 hours. No results for input(s): "PROBNP" in the last 8760 hours. Coagulation Profile: Recent Labs  Lab 03/04/22 1136  INR 1.1*   Thyroid Function Tests: No results for input(s): "TSH", "T4TOTAL", "FREET4", "T3FREE", "THYROIDAB" in the last 72 hours. Lipid Profile: No results for input(s): "CHOL", "HDL", "LDLCALC", "TRIG", "CHOLHDL", "LDLDIRECT" in the last 72 hours. Anemia Panel: No results for input(s): "VITAMINB12", "FOLATE", "FERRITIN", "TIBC", "IRON", "RETICCTPCT" in the last 72 hours. Urine analysis:    Component Value Date/Time   COLORURINE YELLOW 02/15/2022 1059   APPEARANCEUR CLEAR 02/15/2022 1059   LABSPEC 1.013 02/15/2022 1059   PHURINE 7.0 02/15/2022 1059   GLUCOSEU  NEGATIVE 02/15/2022 1059   HGBUR SMALL (A) 02/15/2022 1059   BILIRUBINUR NEGATIVE 02/15/2022 1059   BILIRUBINUR NEGATIVE 10/22/2018 1556   [place] 02/15/2022 1059   PROTEINUR NEGATIVE 02/15/2022 1059   UROBILINOGEN 0.2 10/22/2018 1556   NITRITE NEGATIVE 02/15/2022 1059   LEUKOCYTESUR SMALL (A) 02/15/2022 1059   Sepsis Labs: Invalid input(s): "PROCALCITONIN", "LACTICIDVEN"  Microbiology: No results found for this or any previous visit (from the past 240 hour(s)).  Radiology Studies: No results found.    Kymani Shimabukuro T. [place]  If 7PM-7AM, please contact night-coverage [email or web address] 03/06/2022, 2:46 PM

## 2022-03-06 NOTE — Consult Note (Signed)
Referral MD  Reason for Referral: Hyponatremia; metastatic pancreatic cancer  Chief Complaint  Patient presents with   Abnormal Labs  : I have sent to the emergency room by the gastroenterologist.  HPI: Melanie Alvarez is well-known to me.  She is a very nice 66 year old African-American female.  She has metastatic pancreatic cancer.  She has only had 1 cycle of treatment.  This was way back in May.  She had Abraxane/Gemzar.  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  She had and hepatic abscess a month or so ago.  She had a drain put in this abscess.  Thankfully, the abscess got healed.  The drain has been removed.  We not been able to treat her because of the abscess.  She was seen by her gastroenterologist.  She did have a lot of nausea and vomiting.  She was found to be hypotensive, hyponatremic and hypokalemic.  She subsequently went to the emergency room and was admitted.  When she was seen, her sodium was 122.  Potassium 2.8.  BUN was 6 creatinine 0.60.  Blood sugar was 216.  Her albumin was 3.8.  Her white cell count was 8.  Hemoglobin 12.1.  Platelet count 280,000.  She was given IV fluids.  Patient had a CT scan done.  There is no obstruction.  She has stable pancreatic head mass.  She had occlusion of the superior mesenteric vein.  She seems to be improving.  Her serum is now up to 131.  She still is having a hard time eating.  She says whenever she eats or drinks, it comes back up.  She is probably going to need to have an upper endoscopy to see what is going on.  I do not know she has any kind of gastritis.  There is no obvious obstruction.  She is having some abdominal pain.  There is no constipation.  She has little bit of diarrhea.  There is no bleeding.  She is getting hydromorphone for pain.  Again I think the problem is that she has a nausea and vomiting.  Again there is no obstruction seen on CT scan.  Overall, I would say  performance status is probably ECOG 2.  Past Medical History:  Diagnosis Date   Cancer ([place])    Cirrhosis ([place])    Diabetes mellitus 2012   Diverticulosis    Gallbladder sludge    Hepatitis 2010   history of Hepatitis C   Hepatitis C    Hypertension 2011   Obesity   :   Past Surgical History:  Procedure Laterality Date   BILIARY BRUSHING  09/22/2019   Procedure: BILIARY BRUSHING;  Surgeon: Irving Copas., MD;  Location: [facility];  Service: Gastroenterology;;   BILIARY DILATION  09/22/2019   Procedure: BILIARY DILATION;  Surgeon: Irving Copas., MD;  Location: [facility];  Service: Gastroenterology;;   BILIARY STENT PLACEMENT N/A 09/22/2019   Procedure: BILIARY STENT PLACEMENT;  Surgeon: Irving Copas., MD;  Location: [facility];  Service: Gastroenterology;  Laterality: N/A;   COLONOSCOPY  07/24/2011   Procedure: COLONOSCOPY;  Surgeon: Landry Dyke, MD;  Location: [facility];  Service: Endoscopy;  Laterality: N/A;   Dental Surgery Tooth Extraction     ERCP N/A 09/22/2019   Procedure: ENDOSCOPIC RETROGRADE CHOLANGIOPANCREATOGRAPHY (ERCP);  Surgeon: Irving Copas., MD;  Location: [facility];  Service: Gastroenterology;  Laterality: N/A;   ESOPHAGOGASTRODUODENOSCOPY (EGD) WITH PROPOFOL N/A 09/22/2019   Procedure: ESOPHAGOGASTRODUODENOSCOPY (  EGD) WITH PROPOFOL;  Surgeon: Mansouraty, Telford Nab., MD;  Location: [facility];  Service: Gastroenterology;  Laterality: N/A;   ESOPHAGOGASTRODUODENOSCOPY (EGD) WITH PROPOFOL N/A 09/25/2019   Procedure: ESOPHAGOGASTRODUODENOSCOPY (EGD) WITH PROPOFOL;  Surgeon: Rush Landmark Telford Nab., MD;  Location: [facility];  Service: Gastroenterology;  Laterality: N/A;   ESOPHAGOGASTRODUODENOSCOPY (EGD) WITH PROPOFOL N/A 01/19/2020   Procedure: ESOPHAGOGASTRODUODENOSCOPY (EGD) WITH PROPOFOL;  Surgeon: Rush Landmark Telford Nab., MD;  Location: [place];  Service: Gastroenterology;  Laterality: N/A;   EUS N/A 09/22/2019    Procedure: UPPER ENDOSCOPIC ULTRASOUND (EUS) RADIAL;  Surgeon: Irving Copas., MD;  Location: [facility];  Service: Gastroenterology;  Laterality: N/A;   EUS N/A 01/19/2020   Procedure: UPPER ENDOSCOPIC ULTRASOUND (EUS) RADIAL;  Surgeon: Irving Copas., MD;  Location: [place];  Service: Gastroenterology;  Laterality: N/A;   FIDUCIAL MARKER PLACEMENT N/A 01/19/2020   Procedure: FIDUCIAL MARKER PLACEMENT;  Surgeon: Irving Copas., MD;  Location: [place];  Service: Gastroenterology;  Laterality: N/A;   FINE NEEDLE ASPIRATION  09/22/2019   Procedure: FINE NEEDLE ASPIRATION (FNA) LINEAR;  Surgeon: Irving Copas., MD;  Location: [facility];  Service: Gastroenterology;;   FINE NEEDLE ASPIRATION  09/25/2019   Procedure: FINE NEEDLE ASPIRATION (FNA) RADIAL;  Surgeon: Irving Copas., MD;  Location: [facility];  Service: Gastroenterology;;   HEMOSTASIS CLIP PLACEMENT  09/25/2019   Procedure: HEMOSTASIS CLIP PLACEMENT;  Surgeon: Irving Copas., MD;  Location: [facility];  Service: Gastroenterology;;   IR CATHETER TUBE CHANGE  01/31/2022   IR CHOLANGIOGRAM EXISTING TUBE  02/11/2022   IR IMAGING GUIDED PORT INSERTION  09/24/2019   IR IMAGING GUIDED PORT INSERTION  12/31/2021   IR RADIOLOGIST EVAL & MGMT  01/30/2022   IR RADIOLOGIST EVAL & MGMT  02/14/2022   IR REMOVAL TUN ACCESS W/ PORT W/O FL MOD SED  06/16/2020   SPHINCTEROTOMY  09/22/2019   Procedure: SPHINCTEROTOMY;  Surgeon: Irving Copas., MD;  Location: [facility];  Service: Gastroenterology;;   UPPER ESOPHAGEAL ENDOSCOPIC ULTRASOUND (EUS) N/A 09/25/2019   Procedure: UPPER ESOPHAGEAL ENDOSCOPIC ULTRASOUND (EUS);  Surgeon: Irving Copas., MD;  Location: [facility];  Service: Gastroenterology;  Laterality: N/A;  :   Current Facility-Administered Medications:    0.9 % NaCl with KCl 20 mEq/ L  infusion, , Intravenous, Continuous, Gonfa, Taye T, MD, Last Rate: 100 mL/hr at  03/05/22 2129, New Bag at 03/05/22 2129   amLODipine (NORVASC) tablet 10 mg, 10 mg, Oral, QHS, Tu, Ching T, DO, 10 mg at 03/05/22 2121   apixaban (ELIQUIS) tablet 5 mg, 5 mg, Oral, BID, Tu, Ching T, DO, 5 mg at 03/05/22 2121   atorvastatin (LIPITOR) tablet 20 mg, 20 mg, Oral, QHS, Tu, Ching T, DO, 20 mg at 03/05/22 2121   Chlorhexidine Gluconate Cloth 2 % PADS 6 each, 6 each, Topical, Daily, Gonfa, Taye T, MD   HYDROmorphone (DILAUDID) injection 2 mg, 2 mg, Intravenous, Q3H PRN, Tu, Ching T, DO, 2 mg at 03/06/22 0001   insulin aspart (novoLOG) injection 0-5 Units, 0-5 Units, Subcutaneous, QHS, Gonfa, Taye T, MD   insulin aspart (novoLOG) injection 0-9 Units, 0-9 Units, Subcutaneous, TID WC, Gonfa, Taye T, MD   irbesartan (AVAPRO) tablet 37.5 mg, 37.5 mg, Oral, Daily, Tu, Ching T, DO, 37.5 mg at 03/05/22 0900   labetalol (NORMODYNE) injection 10 mg, 10 mg, Intravenous, Q2H PRN, Gonfa, Taye T, MD   lipase/protease/amylase (CREON) capsule 72,000 Units, 72,000 Units, Oral, TID AC, Tu, Ching T, DO, 72,000 Units at 03/05/22  1709   morphine (PF) 2 MG/ML injection 2 mg, 2 mg, Intravenous, Q2H PRN, Tu, Ching T, DO   ondansetron (ZOFRAN) injection 4 mg, 4 mg, Intravenous, Q6H PRN, Cyndia Skeeters, Taye T, MD, 4 mg at 03/05/22 1625   Oral care mouth rinse, 15 mL, Mouth Rinse, PRN, Cyndia Skeeters, Taye T, MD   pantoprazole (PROTONIX) EC tablet 40 mg, 40 mg, Oral, BID, Cyndia Skeeters, Taye T, MD, 40 mg at 03/05/22 2121   prochlorperazine (COMPAZINE) injection 5 mg, 5 mg, Intravenous, Q6H PRN, Gonfa, Taye T, MD   sucralfate (CARAFATE) 1 GM/10ML suspension 1 g, 1 g, Oral, TID WC & HS, Cyndia Skeeters, Taye T, MD, 1 g at 03/05/22 2121:   amLODipine  10 mg Oral QHS   apixaban  5 mg Oral BID   atorvastatin  20 mg Oral QHS   Chlorhexidine Gluconate Cloth  6 each Topical Daily   insulin aspart  0-5 Units Subcutaneous QHS   insulin aspart  0-9 Units Subcutaneous TID WC   irbesartan  37.5 mg Oral Daily   lipase/protease/amylase  72,000 Units Oral TID  AC   pantoprazole  40 mg Oral BID   sucralfate  1 g Oral TID WC & HS  :   Allergies  Allergen Reactions   Lisinopril Swelling and Other (See Comments)    Facial and upper lip  :   Family History  Problem Relation Age of Onset   Diabetes Sister    Cancer Maternal Grandmother        leukemia    Diabetes Maternal Grandfather    Diabetes Paternal Grandfather    Diabetes Paternal Grandmother    Hypertension Sister    Hypertension Brother    Anesthesia problems Neg Hx   :   Social History   Socioeconomic History   Marital status: Married    Spouse name: Not on file   Number of children: Not on file   Years of education: Not on file   Highest education level: Not on file  Occupational History   Not on file  Tobacco Use   Smoking status: Former    Packs/day: 0.25    Types: Cigarettes    Quit date: 09/20/2019    Years since quitting: 2.4   Smokeless tobacco: Never  Vaping Use   Vaping Use: Never used  Substance and Sexual Activity   Alcohol use: Not Currently    Comment: quit 2022   Drug use: No    Comment: previously    Sexual activity: Yes    Partners: Male    Birth control/protection: Post-menopausal  Other Topics Concern   Not on file  Social History Narrative   Previous [facility] pt.  Last MD was Dr. Delman Cheadle, seen 12/2011      Works part time in [employer] with husband   Social Determinants of Radio broadcast assistant Strain: Not on [employer] Insecurity: Not on file  Transportation Needs: Not on file  Physical Activity: Not on file  Stress: Not on file  Social Connections: Not on file  Intimate Partner Violence: Not on file  :  Review of Systems  Constitutional:  Positive for malaise/fatigue.  HENT: Negative.    Eyes: Negative.   Respiratory: Negative.    Cardiovascular: Negative.   Gastrointestinal:  Positive for abdominal pain and vomiting.  Genitourinary: Negative.   Musculoskeletal: Negative.   Skin:  Negative.   Neurological: Negative.   Endo/Heme/Allergies: Negative.   Psychiatric/Behavioral:  Negative.       Exam: Patient Vitals for the past 24 hrs:  BP Temp Temp src Pulse Resp SpO2  03/06/22 0307 -- -- -- -- 15 --  03/06/22 0115 (!) 133/110 -- -- 90 -- 100 %  03/05/22 2322 (!) 157/92 98.3 F (36.8 C) Oral 96 19 97 %  03/05/22 2119 -- -- -- -- 17 --  03/05/22 1944 138/80 98.5 F (36.9 C) Oral 81 18 97 %  03/05/22 1441 (!) 160/84 98.1 F (36.7 C) -- 90 18 100 %  03/05/22 1402 (!) 142/96 98.4 F (36.9 C) -- 91 16 100 %  03/05/22 1230 (!) 142/96 -- -- (!) 112 16 99 %  03/05/22 1200 (!) 142/96 -- -- (!) 112 -- --  03/05/22 1114 -- 98.3 F (36.8 C) Oral -- -- --  03/05/22 1053 (!) 162/96 -- -- 89 16 99 %  03/05/22 0731 (!) 152/92 97.9 F (36.6 C) Oral 88 16 100 %   Physical Exam Vitals reviewed.  HENT:     Head: Normocephalic and atraumatic.  Eyes:     Pupils: Pupils are equal, round, and reactive to light.  Cardiovascular:     Rate and Rhythm: Normal rate and regular rhythm.     Heart sounds: Normal heart sounds.  Pulmonary:     Effort: Pulmonary effort is normal.     Breath sounds: Normal breath sounds.  Abdominal:     General: Bowel sounds are normal.     Palpations: Abdomen is soft.     Comments: Abdominal exam is soft.  Bowel sounds are present.  There is a little bit of tenderness with palpation.  There is no obvious fluid wave.  There is no obvious abdominal mass.  There is no palpable hepatomegaly.  Musculoskeletal:        General: No tenderness or deformity. Normal range of motion.     Cervical back: Normal range of motion.  Lymphadenopathy:     Cervical: No cervical adenopathy.  Skin:    General: Skin is warm and dry.     Findings: No erythema or rash.  Neurological:     Mental Status: She is alert and oriented to person, place, and time.  Psychiatric:        Behavior: Behavior normal.        Thought Content: Thought content normal.        Judgment:  Judgment normal.     Recent Labs    03/04/22 1136 03/04/22 1449  WBC 8.0 8.5  HGB 12.1 11.3*  HCT 35.7* 33.5*  PLT 280.0 258    Recent Labs    03/05/22 0500 03/05/22 1518  NA 132* 131*  K 3.1* 3.2*  CL 94* 94*  CO2 28 25  GLUCOSE 93 134*  BUN <5* <5*  CREATININE 0.43* 0.46  CALCIUM 9.2 9.3    Blood smear review: None  Pathology: None    Assessment and Plan: Ms. Reimann is a very charming 66 year old Afro-American female.  She has metastatic pancreatic cancer.  We have really not been able to treat her because of extenuating issues.  The big issue was she had this hepatic abscess.  She has this nausea and vomiting.  She is not really able to eat.  I am not sure as to why this is going on.  I do think that she needs to have a upper endoscopy.  I do not know if she has gastritis.  Again there is no obvious obstruction.  Her  hyponatremia is likely from her being dehydrated.  IV fluids have really help replace her sodium and potassium.  I just feel by the Ms. Guin has this malignancy.  We have not been able to really treat her aggressively because she has had additional issues.    She has the superior mesenteric vein thrombus.  She is on Eliquis.  I would not think that this would be the source of vomiting.  We will follow along.  Hopefully, she will be able to get to the bottom of the nausea and vomiting.  I know she will get incredible care from all the staff on [address].  Lattie Haw, MD  Psalms 27:14

## 2022-03-06 NOTE — Progress Notes (Signed)
  Transition of Care [facility]) Screening Note   Patient Details  Name: Melanie Alvarez Date of Birth: [birth year]   Transition of Care [facility]) CM/SW Contact:    Dessa Phi, RN Phone Number: 03/06/2022, 1:58 PM    Transition of Care Department [facility]) has reviewed patient and no TOC needs have been identified at this time. We will continue to monitor patient advancement through interdisciplinary progression rounds. If new patient transition needs arise, please place a TOC consult.

## 2022-03-06 NOTE — Consult Note (Addendum)
Consultation  Referring Provider: TR[facility]/ Gonfa Primary Care Physician:  Flossie Buffy, NP Primary Gastroenterologist:  Dr. Rush Landmark  Reason for Consultation: Metastatic pancreatic cancer, chronic SMA and splenic vein occlusion, admission with persistent nausea vomiting and abdominal pain  HPI: Melanie Alvarez is a 66 y.o. female, with initial diagnosis of pancreatic adenocarcinoma in February 2021 stage II/III at which time she underwent initial work-up including ERCP with metal stent placement for malignant common bile duct stricture per Dr. Rush Landmark. , She has been followed by Dr. Marin Olp, went to a course of chemotherapy was completed in July 2021.  She has had a recurrence of disease as of April 2023 when she underwent imaging because of complaints of abdominal pain.  This showed progression of the tumor involving the pancreatic head with periportal and peripancreatic adenopathy, and occlusion of the SMV and splenic vein. She was started on Eliquis, and initiated on chemotherapy of which she only completed 1 cycle. Unfortunately she was hospitalized 01/16/2022 with complaints of shortness of breath, angiogram was negative for pulmonary embolus however this showed interval development of a 9.8 x 9.2 cm air-fluid collection in the central portion of the liver concerning for hepatic abscess, the pancreatic head mass appeared about the same, surrounding the common bile duct and resulting in occlusion of the superior mesenteric and splenic vein, mildly enlarged periaortic adenopathy noted and mild ascites in the pelvis. She had the hepatic abscess treated with IR drainage and IV antibiotics. She had been started on a Duragesic patch for pain during that admission.  She now relates that over the past couple of weeks she has been feeling worse, has some increase in epigastric pain, and over the past 1-1/2 weeks or so she was having regular episodes of nausea and vomiting to the point of  not being able to keep down much p.o. and feeling very thirsty.  She feels as if she was keeping her medications down however eventually ran out of Eliquis and also says that she ran out of Durogesic patches about a week and a half ago or so. She was seen in our office earlier this week, had labs done which showed significant hyponatremia, hypokalemia and she was also mildly hypotensive.  She was advised to go to the emergency room for admission.  Repeat CT of the abdomen and pelvis shows a patent metal stent in the common bile duct with pneumobilia, unchanged mild ductal dilation, there is a partially calcified pancreatic head mass unchanged in size but poorly delineated on that exam, there is dilation of the pancreatic duct distal.  Given noted occlusion of the SMV and splenic vein the main portal vein patent, stable periaortic adenopathy and no evidence for hepatic abscess.  She has been hydrated, electrolytes are being replaced, and says that she actually was able to keep down breakfast this morning of solid food for the first time.  However while I was in her room she had an episode with a large amount of emesis of liquid/semidigested food. Says that she is now having ongoing abdominal pain over the past few months that is about at its baseline currently.  The Duragesic patch was helpful.  She says when she gets pain medication here IV it does help. Not had any hematemesis, no melena  Today's labs WBC 7.9/hemoglobin 11.2/hematocrit 33/platelets 261 Sodium 126/potassium 3.8/creatinine 0.39 LFTs normal on admission.     Past Medical History:  Diagnosis Date   Cancer ([place])    Cirrhosis ([place])  Diabetes mellitus 2012   Diverticulosis    Gallbladder sludge    Hepatitis 2010   history of Hepatitis C   Hepatitis C    Hypertension 2011   Obesity     Past Surgical History:  Procedure Laterality Date   BILIARY BRUSHING  09/22/2019   Procedure: BILIARY BRUSHING;  Surgeon: Rush Landmark  Telford Nab., MD;  Location: [facility];  Service: Gastroenterology;;   BILIARY DILATION  09/22/2019   Procedure: BILIARY DILATION;  Surgeon: Irving Copas., MD;  Location: [facility];  Service: Gastroenterology;;   BILIARY STENT PLACEMENT N/A 09/22/2019   Procedure: BILIARY STENT PLACEMENT;  Surgeon: Irving Copas., MD;  Location: [facility];  Service: Gastroenterology;  Laterality: N/A;   COLONOSCOPY  07/24/2011   Procedure: COLONOSCOPY;  Surgeon: Landry Dyke, MD;  Location: [facility];  Service: Endoscopy;  Laterality: N/A;   Dental Surgery Tooth Extraction     ERCP N/A 09/22/2019   Procedure: ENDOSCOPIC RETROGRADE CHOLANGIOPANCREATOGRAPHY (ERCP);  Surgeon: Irving Copas., MD;  Location: [facility];  Service: Gastroenterology;  Laterality: N/A;   ESOPHAGOGASTRODUODENOSCOPY (EGD) WITH PROPOFOL N/A 09/22/2019   Procedure: ESOPHAGOGASTRODUODENOSCOPY (EGD) WITH PROPOFOL;  Surgeon: Rush Landmark Telford Nab., MD;  Location: [facility];  Service: Gastroenterology;  Laterality: N/A;   ESOPHAGOGASTRODUODENOSCOPY (EGD) WITH PROPOFOL N/A 09/25/2019   Procedure: ESOPHAGOGASTRODUODENOSCOPY (EGD) WITH PROPOFOL;  Surgeon: Rush Landmark Telford Nab., MD;  Location: [facility];  Service: Gastroenterology;  Laterality: N/A;   ESOPHAGOGASTRODUODENOSCOPY (EGD) WITH PROPOFOL N/A 01/19/2020   Procedure: ESOPHAGOGASTRODUODENOSCOPY (EGD) WITH PROPOFOL;  Surgeon: Rush Landmark Telford Nab., MD;  Location: [place];  Service: Gastroenterology;  Laterality: N/A;   EUS N/A 09/22/2019   Procedure: UPPER ENDOSCOPIC ULTRASOUND (EUS) RADIAL;  Surgeon: Irving Copas., MD;  Location: [facility];  Service: Gastroenterology;  Laterality: N/A;   EUS N/A 01/19/2020   Procedure: UPPER ENDOSCOPIC ULTRASOUND (EUS) RADIAL;  Surgeon: Irving Copas., MD;  Location: [place];  Service: Gastroenterology;  Laterality: N/A;   FIDUCIAL MARKER PLACEMENT N/A 01/19/2020   Procedure: FIDUCIAL MARKER  PLACEMENT;  Surgeon: Irving Copas., MD;  Location: [place];  Service: Gastroenterology;  Laterality: N/A;   FINE NEEDLE ASPIRATION  09/22/2019   Procedure: FINE NEEDLE ASPIRATION (FNA) LINEAR;  Surgeon: Irving Copas., MD;  Location: [facility];  Service: Gastroenterology;;   FINE NEEDLE ASPIRATION  09/25/2019   Procedure: FINE NEEDLE ASPIRATION (FNA) RADIAL;  Surgeon: Irving Copas., MD;  Location: [facility];  Service: Gastroenterology;;   HEMOSTASIS CLIP PLACEMENT  09/25/2019   Procedure: HEMOSTASIS CLIP PLACEMENT;  Surgeon: Irving Copas., MD;  Location: [facility];  Service: Gastroenterology;;   IR CATHETER TUBE CHANGE  01/31/2022   IR CHOLANGIOGRAM EXISTING TUBE  02/11/2022   IR IMAGING GUIDED PORT INSERTION  09/24/2019   IR IMAGING GUIDED PORT INSERTION  12/31/2021   IR RADIOLOGIST EVAL & MGMT  01/30/2022   IR RADIOLOGIST EVAL & MGMT  02/14/2022   IR REMOVAL TUN ACCESS W/ PORT W/O FL MOD SED  06/16/2020   SPHINCTEROTOMY  09/22/2019   Procedure: SPHINCTEROTOMY;  Surgeon: Irving Copas., MD;  Location: [facility];  Service: Gastroenterology;;   UPPER ESOPHAGEAL ENDOSCOPIC ULTRASOUND (EUS) N/A 09/25/2019   Procedure: UPPER ESOPHAGEAL ENDOSCOPIC ULTRASOUND (EUS);  Surgeon: Irving Copas., MD;  Location: [facility];  Service: Gastroenterology;  Laterality: N/A;    Prior to Admission medications   Medication Sig Start Date End Date Taking? Authorizing Provider  amLODipine (NORVASC) 10 MG tablet Take 1 tablet (10 mg total) by mouth  at bedtime. 11/27/21  Yes Nche, Charlene Brooke, NP  apixaban (ELIQUIS) 5 MG TABS tablet Take 1 tablet (5 mg total) by mouth 2 (two) times daily. 03/04/22  Yes Vicie Mutters R, PA-C  atorvastatin (LIPITOR) 20 MG tablet Take 1 tablet (20 mg total) by mouth at bedtime. 01/25/21  Yes Nche, Charlene Brooke, NP  CREON 36000-114000 units CPEP capsule TAKE 2 CAPSULES BY MOUTH THREE TIMES DAILY BEFORE MEAL(S) Patient taking  differently: Take 72,000 Units by mouth 3 (three) times daily before meals. 02/01/22  Yes Volanda Napoleon, MD  glipiZIDE (GLUCOTROL XL) 5 MG 24 hr tablet Take 1 tablet (5 mg total) by mouth daily with breakfast. 09/14/21  Yes Nche, Charlene Brooke, NP  HYDROmorphone (DILAUDID) 4 MG tablet Take 1 tablet (4 mg total) by mouth every 4 (four) hours as needed for severe pain. 03/04/22  Yes Ennever, Rudell Cobb, MD  olmesartan (BENICAR) 5 MG tablet Take 1 tablet by mouth once daily 02/11/22  Yes Nche, Charlene Brooke, NP  ALPRAZolam (XANAX) 1 MG tablet Take 0.5 tablets (0.5 mg total) by mouth every 8 (eight) hours as needed for anxiety. 02/11/22   Volanda Napoleon, MD  fentaNYL (DURAGESIC) 25 MCG/HR Place 1 patch onto the skin every 3 (three) days. Patient not taking: Reported on 03/04/2022 01/02/22   Volanda Napoleon, MD  metFORMIN (GLUCOPHAGE XR) 750 MG 24 hr tablet Take 1 tablet (750 mg total) by mouth daily with breakfast. Patient not taking: Reported on 03/04/2022 09/14/21   Nche, Charlene Brooke, NP  metoprolol succinate (TOPROL-XL) 25 MG 24 hr tablet TAKE 1 TABLET BY MOUTH ONCE DAILY Patient not taking: Reported on 03/04/2022 01/25/21   Nche, Charlene Brooke, NP  ondansetron (ZOFRAN) 8 MG tablet Take 1 tablet (8 mg total) by mouth 2 (two) times daily as needed (Nausea or vomiting). 03/04/22   Vladimir Crofts, PA-C  pantoprazole (PROTONIX) 40 MG tablet Take 1 tablet (40 mg total) by mouth 2 (two) times daily for 14 days. Patient not taking: Reported on 01/15/2022 12/04/21 12/18/21  Flossie Buffy, NP  pantoprazole (PROTONIX) 40 MG tablet Take 1 tablet (40 mg total) by mouth daily. 12/24/21   Volanda Napoleon, MD  potassium chloride (KLOR-CON M) 20 MEQ tablet Take 1 tablet (20 mEq total) by mouth 2 (two) times daily. Patient not taking: Reported on 01/15/2022 11/27/21   Nche, Charlene Brooke, NP  prochlorperazine (COMPAZINE) 10 MG tablet Take 1 tablet (10 mg total) by mouth every 6 (six) hours as needed (Nausea or  vomiting). Patient not taking: Reported on 03/04/2022 12/26/21   Volanda Napoleon, MD  sucralfate (CARAFATE) 1 g tablet Take 1 tablet (1 g total) by mouth 2 (two) times daily. 03/04/22   Vladimir Crofts, PA-C    Current Facility-Administered Medications  Medication Dose Route Frequency Provider Last Rate Last Admin   0.9 % NaCl with KCl 20 mEq/ L  infusion   Intravenous Continuous Wendee Beavers T, MD 100 mL/hr at 03/06/22 0747 New Bag at 03/06/22 0747   amLODipine (NORVASC) tablet 10 mg  10 mg Oral QHS Tu, Ching T, DO   10 mg at 03/05/22 2121   apixaban (ELIQUIS) tablet 5 mg  5 mg Oral BID Tu, Ching T, DO   5 mg at 03/06/22 9163   Chlorhexidine Gluconate Cloth 2 % PADS 6 each  6 each Topical Daily Mercy Riding, MD   6 each at 03/06/22 1000   HYDROmorphone (DILAUDID) injection 2 mg  2  mg Intravenous Q3H PRN Tu, Ching T, DO   2 mg at 03/06/22 0001   insulin aspart (novoLOG) injection 0-5 Units  0-5 Units Subcutaneous QHS Gonfa, Taye T, MD       insulin aspart (novoLOG) injection 0-9 Units  0-9 Units Subcutaneous TID WC Wendee Beavers T, MD   2 Units at 03/06/22 0828   irbesartan (AVAPRO) tablet 37.5 mg  37.5 mg Oral Daily Tu, Ching T, DO   37.5 mg at 03/06/22 0959   labetalol (NORMODYNE) injection 10 mg  10 mg Intravenous Q2H PRN Mercy Riding, MD       lipase/protease/amylase (CREON) capsule 72,000 Units  72,000 Units Oral TID AC Tu, Ching T, DO   72,000 Units at 03/06/22 5465   magnesium sulfate IVPB 4 g 100 mL  4 g Intravenous Once Wendee Beavers T, MD       morphine (PF) 2 MG/ML injection 2 mg  2 mg Intravenous Q2H PRN Tu, Ching T, DO   2 mg at 03/06/22 1001   ondansetron (ZOFRAN) injection 4 mg  4 mg Intravenous Q6H PRN Mercy Riding, MD   4 mg at 03/05/22 1625   Oral care mouth rinse  15 mL Mouth Rinse PRN Gonfa, Charlesetta Ivory, MD       pantoprazole (PROTONIX) EC tablet 80 mg  80 mg Oral BID Volanda Napoleon, MD   80 mg at 03/06/22 6812   prochlorperazine (COMPAZINE) injection 5 mg  5 mg Intravenous Q6H  PRN Gonfa, Taye T, MD       sodium chloride flush (NS) 0.9 % injection 10-40 mL  10-40 mL Intracatheter Q12H Gonfa, Taye T, MD       sodium chloride flush (NS) 0.9 % injection 10-40 mL  10-40 mL Intracatheter PRN Gonfa, Taye T, MD       sucralfate (CARAFATE) 1 GM/10ML suspension 1 g  1 g Oral TID WC & HS Wendee Beavers T, MD   1 g at 03/06/22 0829    Allergies as of 03/04/2022 - Review Complete 03/04/2022  Allergen Reaction Noted   Lisinopril Swelling and Other (See Comments) 10/13/2018    Family History  Problem Relation Age of Onset   Diabetes Sister    Cancer Maternal Grandmother        leukemia    Diabetes Maternal Grandfather    Diabetes Paternal Grandfather    Diabetes Paternal Grandmother    Hypertension Sister    Hypertension Brother    Anesthesia problems Neg Hx     Social History   Socioeconomic History   Marital status: Married    Spouse name: Not on file   Number of children: Not on file   Years of education: Not on file   Highest education level: Not on file  Occupational History   Not on file  Tobacco Use   Smoking status: Former    Packs/day: 0.25    Types: Cigarettes    Quit date: 09/20/2019    Years since quitting: 2.4   Smokeless tobacco: Never  Vaping Use   Vaping Use: Never used  Substance and Sexual Activity   Alcohol use: Not Currently    Comment: quit 2022   Drug use: No    Comment: previously    Sexual activity: Yes    Partners: Male    Birth control/protection: Post-menopausal  Other Topics Concern   Not on file  Social History Narrative   Previous [facility] pt.  Last MD was Dr. Delman Cheadle,  seen 12/2011      Works part time in [employer] with husband   Social Determinants of Radio broadcast assistant Strain: Not on [employer] Insecurity: Not on file  Transportation Needs: Not on file  Physical Activity: Not on file  Stress: Not on file  Social Connections: Not on file  Intimate Partner Violence: Not  on file    Review of Systems: Pertinent positive and negative review of systems were noted in the above HPI section.  All other review of systems was otherwise negative.   Physical Exam: Vital signs in last 24 hours: Temp:  [98.1 F (36.7 C)-98.5 F (36.9 C)] 98.3 F (36.8 C) (07/18 2322) Pulse Rate:  [81-112] 90 (07/19 0115) Resp:  [15-19] 15 (07/19 0307) BP: (133-160)/(80-110) 133/110 (07/19 0115) SpO2:  [97 %-100 %] 100 % (07/19 0115) Last BM Date : 03/05/22 General:   Alert,  Well-developed, chronically ill-appearing older African-American female, pleasant and cooperative in NAD-has been at bedside Head:  Normocephalic and atraumatic. Eyes:  Sclera clear, no icterus.   Conjunctiva pink. Ears:  Normal auditory acuity. Nose:  No deformity, discharge,  or lesions. Mouth:  No deformity or lesions.   Neck:  Supple; no masses or thyromegaly. Lungs:  Clear throughout to auscultation.   No wheezes, crackles, or rhonchi. portacath in place Heart:  Regular rate and rhythm; no murmurs, clicks, rubs,  or gallops. Abdomen:  Soft, BS active,, no succussion splash nonpalp mass or hsm.  She has definite tenderness in the epigastrium and across the upper abdomen Rectal: Not done Msk:  Symmetrical without gross deformities. . Pulses:  Normal pulses noted. Extremities:  Without clubbing or edema. Neurologic:  Alert and  oriented x4;  grossly normal neurologically. Skin:  Intact without significant lesions or rashes.. Psych:  Alert and cooperative. Normal mood and affect.  Intake/Output from previous day: 07/18 0701 - 07/19 0700 In: 1111 [I.V.:753.5; IV Piggyback:357.5] Out: 1 [Emesis/NG output:1] Intake/Output this shift: No intake/output data recorded.  Lab Results: Recent Labs    03/04/22 1136 03/04/22 1449 03/06/22 0838  WBC 8.0 8.5 7.9  HGB 12.1 11.3* 11.2*  HCT 35.7* 33.5* 33.5*  PLT 280.0 258 261   BMET Recent Labs    03/05/22 0500 03/05/22 1518 03/06/22 0838  NA  132* 131* 126*  K 3.1* 3.2* 3.8  CL 94* 94* 93*  CO2 '28 25 24  '$ GLUCOSE 93 134* 157*  BUN <5* <5* <5*  CREATININE 0.43* 0.46 0.39*  CALCIUM 9.2 9.3 8.5*   LFT Recent Labs    03/04/22 1449 03/05/22 1518 03/06/22 0838  PROT 7.5  --   --   ALBUMIN 3.3*   < > 3.0*  AST 24  --   --   ALT 22  --   --   ALKPHOS 64  --   --   BILITOT 0.9  --   --    < > = values in this interval not displayed.   PT/INR Recent Labs    03/04/22 1136  LABPROT 11.9  INR 1.1*    IMPRESSION:  #18 66 year old African-American female initially diagnosed with stage II/III pancreatic adenocarcinoma 2021, treated with course of chemotherapy.  Patient had recurrence/progression diagnosed May 2023 and now has stage IV pancreatic cancer with enlargement of the pancreatic head mass, and development of periportal adenopathy and SMV splenic vein thrombosis Completed 1 cycle of chemotherapy  #2 large hepatic abscess June 2023-this post  IR drainage, IV antibiotics, drain has been removed and abscess has resolved  #3 now with increasing abdominal pain, nausea and vomiting, with inability to keep down p.o.'s.  Progressive symptoms for at least a week prior to admission. Found on outpatient labs to have significant hyponatremia and hypokalemia.  She has improved since admission but is still having significant abdominal pain, nausea and though was able to keep down breakfast today vomited a large amount about an hour ago.  I think her abdominal pain can clearly be explained by her stage IV pancreatic adenocarcinoma and pain needs to be managed aggressively  Think she may have had a component of withdrawal when she ran out of Duragesic patches at home accounting for persistent nausea vomiting and electrolyte disturbances. 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 PPI twice daily changed to 40 mg twice daily IV Zofran every 6 hours as needed for nausea offer regularly Diet to clear liquids Continue electrolyte replacement I think she will benefit from being placed back on Duragesic patches for pain management - was only receiving morphine as needed here  Hold Creon until taking p.o.'s well  We will schedule her for upper endoscopy for Friday with Dr. Fuller Plan. Hold Eliquis until post endoscopy  GI will follow with you   Amy Esterwood PA-C 03/06/2022, 12:09 PM   Attending Physician Note   I have taken a history, reviewed the chart and examined the patient. I performed a substantive portion of this encounter, including complete performance of at least one of the key components, in conjunction with the APP. I agree with the APP's note, impression and recommendations with my edits. My additional impressions and recommendations are as follows.   Stage IV pancreatic cancer with enlargement of the pancreatic head mass, and development of periportal adenopathy and SMV, splenic vein thrombosis on Eliquis. Large hepatic abscess in June post IR drainage. Admitted with increasing abdominal pain, N/V, hyponatremia, hypokalemia. CT AP shows stable biliary dilation, SEMS in place, resolved hepatic abscess, pancreatic mass, SMV / SV thrombosis, no acute pathology.   Abdominal pain, N/V likely from stage IV pancreatic cancer and suspected withdrawal from Duragesic. R/O ulcer, gastritis, esophagitis. PPI bid, suggest resuming Duragesic - pain mgmt per primary service, clear liquids, EGD Friday after Eliquis hold.   Hyponatremia, hypokalemia per primary service.   Lucio Edward, MD [facility] See [facility], [facility], for our on call provider

## 2022-03-07 DIAGNOSIS — R112 Nausea with vomiting, unspecified: Secondary | ICD-10-CM | POA: Diagnosis not present

## 2022-03-07 DIAGNOSIS — E871 Hypo-osmolality and hyponatremia: Secondary | ICD-10-CM | POA: Diagnosis not present

## 2022-03-07 DIAGNOSIS — E876 Hypokalemia: Secondary | ICD-10-CM | POA: Diagnosis not present

## 2022-03-07 DIAGNOSIS — I82890 Acute embolism and thrombosis of other specified veins: Secondary | ICD-10-CM | POA: Diagnosis not present

## 2022-03-07 DIAGNOSIS — R1013 Epigastric pain: Secondary | ICD-10-CM | POA: Diagnosis not present

## 2022-03-07 DIAGNOSIS — C250 Malignant neoplasm of head of pancreas: Secondary | ICD-10-CM | POA: Diagnosis not present

## 2022-03-07 DIAGNOSIS — K7460 Unspecified cirrhosis of liver: Secondary | ICD-10-CM | POA: Diagnosis not present

## 2022-03-07 DIAGNOSIS — D649 Anemia, unspecified: Secondary | ICD-10-CM

## 2022-03-07 LAB — RENAL FUNCTION PANEL
Albumin: 2.8 g/dL — ABNORMAL LOW (ref 3.5–5.0)
Anion gap: 9 (ref 5–15)
BUN: <5 mg/dL — ABNORMAL LOW (ref 8–23)
CO2: 23 mmol/L (ref 22–32)
Calcium: 8.7 mg/dL — ABNORMAL LOW (ref 8.9–10.3)
Chloride: 99 mmol/L (ref 98–111)
Creatinine, Ser: 0.47 mg/dL (ref 0.44–1.00)
GFR, Estimated: >60 mL/min (ref >60)
Glucose, Bld: 138 mg/dL — ABNORMAL HIGH (ref 70–99)
Phosphorus: 3.4 mg/dL (ref 2.5–4.6)
Potassium: 3.8 mmol/L (ref 3.5–5.1)
Sodium: 131 mmol/L — ABNORMAL LOW (ref 135–145)

## 2022-03-07 LAB — CBC
HCT: 32.1 % — ABNORMAL LOW (ref 36.0–46.0)
Hemoglobin: 10.7 g/dL — ABNORMAL LOW (ref 12.0–15.0)
MCH: 28.8 pg (ref 26.0–34.0)
MCHC: 33.3 g/dL (ref 30.0–36.0)
MCV: 86.5 fL (ref 80.0–100.0)
Platelets: 258 10*3/uL (ref 150–400)
RBC: 3.71 MIL/uL — ABNORMAL LOW (ref 3.87–5.11)
RDW: 16.1 % — ABNORMAL HIGH (ref 11.5–15.5)
WBC: 8.6 10*3/uL (ref 4.0–10.5)
nRBC: 0 % (ref 0.0–0.2)

## 2022-03-07 LAB — GLUCOSE, CAPILLARY
Glucose-Capillary: 139 mg/dL — ABNORMAL HIGH (ref 70–99)
Glucose-Capillary: 116 mg/dL — ABNORMAL HIGH (ref 70–99)
Glucose-Capillary: 125 mg/dL — ABNORMAL HIGH (ref 70–99)
Glucose-Capillary: 92 mg/dL (ref 70–99)

## 2022-03-07 LAB — MAGNESIUM: Magnesium: 1.9 mg/dL (ref 1.7–2.4)

## 2022-03-07 MED ORDER — PANCRELIPASE (LIP-PROT-AMYL) 12000-38000 UNITS PO CPEP: 72000.0000 [IU] | ORAL_CAPSULE | Freq: Three times a day (TID) | ORAL | Status: DC

## 2022-03-07 NOTE — Progress Notes (Signed)
It looks like Ms. Melanie Alvarez is going to endoscopy today.  Hopefully, there will be able to see someone that can be the source of the vomiting.  She had a little bit of liquid yesterday.  She says she kept it down.  The only lab that is back today is the CBC.  This shows a white cell count of 8.6.  Hemoglobin 10.7.  Platelet count 258,000.  MCV is 86.  Her magnesium is 1.9.  The rest of her labs are pending.  She does not complain of any obvious pain right now.  She has had no diarrhea.  Her vital signs are all stable.  Temperature 98.1.  Pulse 90.  Blood pressure 133/71.  Her head exam is unremarkable.  There is no mucositis.  Her oral mucosa is slightly dry.  Lungs are clear.  Cardiac exam regular rate and rhythm.  Abdomen is soft.  Bowel sounds are present.  There is no obvious fluid wave.  There may be a little bit of tenderness to palpation.  There is no abdominal mass.  There is no palpable liver or spleen tip.  Extremity shows no clubbing, cyanosis or edema.  We will have to see what the upper endoscopy shows.  This will be the key.  Again she has had no chemotherapy probably for 2 months.  She will had 1 dose of chemotherapy.  We really need to try to get her back on a treatment if possible.  I appreciate the incredible care that she is getting from all the staff on 4 E.  Lattie Haw, MD  [place] 14:14

## 2022-03-07 NOTE — H&P (View-Only) (Signed)
Progress Note   Subjective  Chief Complaint: Metastatic pancreatic cancer, chronic SMA and splenic vein occlusion admitted with persistent nausea ,vomiting and abdominal pain  Patient tells me symptoms have not changed overnight she continues with abdominal pain nausea and vomiting.  Aware of plans for EGD tomorrow.   Objective   Vital signs in last 24 hours: Temp:  [98.1 F (36.7 C)-99 F (37.2 C)] 98.1 F (36.7 C) (07/20 0437) Pulse Rate:  [87-97] 90 (07/20 0437) Resp:  [16-25] 19 (07/20 0437) BP: (133-150)/(67-83) 133/71 (07/20 0437) SpO2:  [98 %-100 %] 98 % (07/20 0437) Last BM Date : 03/05/22 General:    Elderly AA female in NAD Heart:  Regular rate and rhythm; no murmurs Lungs: Respirations even and unlabored, lungs CTA bilaterally Abdomen:  Soft, marked TTP across the upper abdomen with involuntary guarding and nondistended. Normal bowel sounds. Extremities:  Without edema. Neurologic:  Alert and oriented,  grossly normal neurologically. Psych:  Cooperative. Normal mood and affect.  Intake/Output from previous day: 07/19 0701 - 07/20 0700 In: 3099.5 [P.O.:960; I.V.:2039.6; IV Piggyback:100] Out: 200 [Urine:200]   Lab Results: Recent Labs    03/04/22 1449 03/06/22 0838 03/07/22 0408  WBC 8.5 7.9 8.6  HGB 11.3* 11.2* 10.7*  HCT 33.5* 33.5* 32.1*  PLT 258 261 258   BMET Recent Labs    03/05/22 1518 03/06/22 0838 03/07/22 0408  NA 131* 126* 131*  K 3.2* 3.8 3.8  CL 94* 93* 99  CO2 '25 24 23  '$ GLUCOSE 134* 157* 138*  BUN <5* <5* <5*  CREATININE 0.46 0.39* 0.47  CALCIUM 9.3 8.5* 8.7*   LFT Recent Labs    03/04/22 1449 03/05/22 1518 03/07/22 0408  PROT 7.5  --   --   ALBUMIN 3.3*   < > 2.8*  AST 24  --   --   ALT 22  --   --   ALKPHOS 64  --   --   BILITOT 0.9  --   --    < > = values in this interval not displayed.   PT/INR Recent Labs    03/04/22 1136  LABPROT 11.9  INR 1.1*     Assessment / Plan:   Assessment: 1.  Stage II/III  pancreatic adenocarcinoma 2021: Treated with a course of chemotherapy with recurrence/progression diagnosed May 2023 and now has stage IV pancreatic cancer with enlargement of the pancreatic head mass and development of periportal adenopathy and SMV splenic vein thrombosis: Completed 1 cycle of chemotherapy 2.  Abdominal pain with nausea and vomiting: Inability to keep down p.o.'s, progressive symptoms over the past week prior to admission, continue here 3.  Hypokalemia: Resolved this hospitalization with supplementation 4.  Chronic anticoagulation: For splenic vein and SMV thrombosis on Eliquis-currently on hold  Plan: 1.  Continue PPI po twice daily, 40 mg 2.  Continue IV Zofran every 6 hours as needed for nausea 3.  Clear liquids as tolerated and n.p.o. at midnight 4.  Continue electrolyte replacement as needed 5.  Again patient would likely benefit from being placed back on Duragesic patches for pain management 6.  Again hold Creon until patient is able to tolerate a full liquid diet at least, this was discussed with nursing staff today as there was some question 7.  Patient scheduled for an EGD tomorrow or Saturday.  She has already had risks and benefits discussed with her and agrees to proceed.  Thank you for kind consultation, we will continue to follow  LOS: 2 days   Levin Erp  03/07/2022, 10:55 AM    Attending Physician Note   I have taken an interval history, reviewed the chart and examined the patient. I performed a substantive portion of this encounter, including complete performance of at least one of the key components, in conjunction with the APP. I agree with the APP's note, impression and recommendations with my edits. My additional impressions and recommendations are as follows.   Abdominal pain, nausea, vomiting minimally improved. Continue current mgmt. EGD Friday or Saturday pending endo unit and anesthesia availability.   Melanie Edward, MD [facility] See  AMION, [facility], for our on call provider

## 2022-03-07 NOTE — Progress Notes (Addendum)
Progress Note   Subjective  Chief Complaint: Metastatic pancreatic cancer, chronic SMA and splenic vein occlusion admitted with persistent nausea ,vomiting and abdominal pain  Patient tells me symptoms have not changed overnight she continues with abdominal pain nausea and vomiting.  Aware of plans for EGD tomorrow.   Objective   Vital signs in last 24 hours: Temp:  [98.1 F (36.7 C)-99 F (37.2 C)] 98.1 F (36.7 C) (07/20 0437) Pulse Rate:  [87-97] 90 (07/20 0437) Resp:  [16-25] 19 (07/20 0437) BP: (133-150)/(67-83) 133/71 (07/20 0437) SpO2:  [98 %-100 %] 98 % (07/20 0437) Last BM Date : 03/05/22 General:    Elderly AA female in NAD Heart:  Regular rate and rhythm; no murmurs Lungs: Respirations even and unlabored, lungs CTA bilaterally Abdomen:  Soft, marked TTP across the upper abdomen with involuntary guarding and nondistended. Normal bowel sounds. Extremities:  Without edema. Neurologic:  Alert and oriented,  grossly normal neurologically. Psych:  Cooperative. Normal mood and affect.  Intake/Output from previous day: 07/19 0701 - 07/20 0700 In: 3099.5 [P.O.:960; I.V.:2039.6; IV Piggyback:100] Out: 200 [Urine:200]   Lab Results: Recent Labs    03/04/22 1449 03/06/22 0838 03/07/22 0408  WBC 8.5 7.9 8.6  HGB 11.3* 11.2* 10.7*  HCT 33.5* 33.5* 32.1*  PLT 258 261 258   BMET Recent Labs    03/05/22 1518 03/06/22 0838 03/07/22 0408  NA 131* 126* 131*  K 3.2* 3.8 3.8  CL 94* 93* 99  CO2 '25 24 23  '$ GLUCOSE 134* 157* 138*  BUN <5* <5* <5*  CREATININE 0.46 0.39* 0.47  CALCIUM 9.3 8.5* 8.7*   LFT Recent Labs    03/04/22 1449 03/05/22 1518 03/07/22 0408  PROT 7.5  --   --   ALBUMIN 3.3*   < > 2.8*  AST 24  --   --   ALT 22  --   --   ALKPHOS 64  --   --   BILITOT 0.9  --   --    < > = values in this interval not displayed.   PT/INR Recent Labs    03/04/22 1136  LABPROT 11.9  INR 1.1*     Assessment / Plan:   Assessment: 1.  Stage II/III  pancreatic adenocarcinoma 2021: Treated with a course of chemotherapy with recurrence/progression diagnosed May 2023 and now has stage IV pancreatic cancer with enlargement of the pancreatic head mass and development of periportal adenopathy and SMV splenic vein thrombosis: Completed 1 cycle of chemotherapy 2.  Abdominal pain with nausea and vomiting: Inability to keep down p.o.'s, progressive symptoms over the past week prior to admission, continue here 3.  Hypokalemia: Resolved this hospitalization with supplementation 4.  Chronic anticoagulation: For splenic vein and SMV thrombosis on Eliquis-currently on hold  Plan: 1.  Continue PPI po twice daily, 40 mg 2.  Continue IV Zofran every 6 hours as needed for nausea 3.  Clear liquids as tolerated and n.p.o. at midnight 4.  Continue electrolyte replacement as needed 5.  Again patient would likely benefit from being placed back on Duragesic patches for pain management 6.  Again hold Creon until patient is able to tolerate a full liquid diet at least, this was discussed with nursing staff today as there was some question 7.  Patient scheduled for an EGD tomorrow or Saturday.  She has already had risks and benefits discussed with her and agrees to proceed.  Thank you for kind consultation, we will continue to follow  LOS: 2 days   Levin Erp  03/07/2022, 10:55 AM    Attending Physician Note   I have taken an interval history, reviewed the chart and examined the patient. I performed a substantive portion of this encounter, including complete performance of at least one of the key components, in conjunction with the APP. I agree with the APP's note, impression and recommendations with my edits. My additional impressions and recommendations are as follows.   Abdominal pain, nausea, vomiting minimally improved. Continue current mgmt. EGD Friday or Saturday pending endo unit and anesthesia availability.   Lucio Edward, MD [facility] See  AMION, [facility], for our on call provider

## 2022-03-07 NOTE — Plan of Care (Signed)
  Problem: Activity: Goal: Risk for activity intolerance will decrease Outcome: Progressing   Problem: Elimination: Goal: Will not experience complications related to bowel motility Outcome: Progressing Goal: Will not experience complications related to urinary retention Outcome: Progressing   Problem: Safety: Goal: Ability to remain free from injury will improve Outcome: Progressing   

## 2022-03-07 NOTE — Progress Notes (Signed)
PROGRESS NOTE  Melanie Alvarez NTZ:[id number] DOB: Jul 21, 1956   PCP: Flossie Buffy, NP  Patient is from: Home.  Lives with husband.  Independently ambulates at baseline.  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  Lab work at the GI office concerning for hyponatremia and hypokalemia, and patient was directed to ED.  Of note, patient was off Eliquis for 2 weeks.  She did not know she was supposed to continue after starter pack.  In ED, Na 122.  K2.8.  CT abdomen and pelvis showed no acute finding but stable mild intra and extrahepatic biliary ductal dilation with palliative metabolic biliary stent in place, stable pancreatic mass with occlusion of the mesenteric and splenic vein.    Electrolytes improved.  Still with intractable nausea, vomiting and abdominal pain.  Oncology and GI consulted.  Plan for EGD on 7/21 after Eliquis washout.  Subjective: Seen and examined earlier this morning.  No major events overnight of this morning.  Continues to endorse abdominal pain, nausea and vomiting.  Objective: Vitals:   03/06/22 1446 03/06/22 2149 03/07/22 0437 03/07/22 1259  BP: 140/83 (!) 150/67 133/71 129/83  Pulse: 97 87 90 100  Resp: 16 (!) '25 19 18  '$ Temp: 99 F (37.2 C) 98.1 F (36.7 C) 98.1 F (36.7 C) 98 F (36.7 C)  TempSrc: Oral Oral Oral Oral  SpO2: 100% 100% 98% 99%  Weight:      Height:        Examination:  GENERAL: No apparent distress.  Nontoxic. HEENT: MMM.  Vision and hearing grossly intact.  NECK: Supple.  No apparent JVD.  RESP:  No IWOB.  Fair aeration  bilaterally. CVS:  RRR. Heart sounds normal.  ABD/GI/GU: BS+. Abd soft.  Tenderness over epigastric and LUQ.  No rebound or guarding. MSK/EXT:  Moves extremities. No apparent deformity. No edema.  SKIN: no apparent skin lesion or wound NEURO: Awake and alert. Oriented appropriately.  No apparent focal neuro deficit. PSYCH: Calm. Normal affect.   Procedures:  None  Microbiology summarized: None  Assessment and plan: Principal Problem:   Hyponatremia Active Problems:   Hypertension   Hypokalemia   Cirrhosis of liver (HCC)   Malignant neoplasm of pancreas (HCC)   Pancreatic insufficiency   Mesenteric vein thrombosis (HCC)   Acute thrombosis of splenic vein   Intractable nausea and vomiting   Abdominal pain, epigastric   Hypomagnesemia  Intractable nausea/vomiting/abdominal pain: Likely from underlying pancreatic malignancy.  Also concern about withdrawal from fentanyl patch.  Last chemo seems to be on 12/24/2021.  Lipase and LFTs within normal.  CT abdomen without acute finding but chronic intra and extra biliary ductal dilation with pancreatic mass.  Still with nausea, vomiting and abdominal pain.  Tender to palpation over epigastric and LUQ. -Continue IV fluid, antiemetics, PPI, Carafate and analgesics. -Resume Duragesic close to discharge. -Appreciate GI help-plan for EGD after Eliquis washout on Friday. -Clear liquid diet  Hyponatremia: Na 122>> 131>>.  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 -Continue IV fluid  -Discontinue Avapro  Hypokalemia/hypomagnesemia:  -Continue NS with KCl -Monitor and replenish as appropriate   Portal  and mesenteric vein thrombosis: Recent diagnosis.  Patient thought she did not need Eliquis and stopped taking after starter pack. -Hold Eliquis for EGD.   Malignant neoplasm of pancreas: Followed by Dr. Marin Olp.  On systemic chemo. 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 -Continue monitoring  NIDDM-2 with hyperglycemia: A1c 7.5% on 11/27/2021.  On glipizide and metformin at home. Recent Labs  Lab 03/06/22 1237 03/06/22 1608 03/06/22 2155 03/07/22 0745 03/07/22 1116  GLUCAP 163* 118* 151* 139* 116*  -Continue SSI-sensitive  History of hepatic abscess-treated and resolved.  Status post drain removal on 6/29.  Essential hypertension: Normotensive for most part. -Continue amlodipine -Discontinued Avapro with the hope to help hyponatremia  Normocytic anemia: Slight drop in Hgb likely dilution.  No report of melena or hematochezia. Recent Labs    01/15/22 1116 01/16/22 0500 01/18/22 0621 01/22/22 0740 01/25/22 1240 02/15/22 0848 03/04/22 1136 03/04/22 1449 03/06/22 0838 03/07/22 0408  HGB 11.8* 11.0* 11.1* 11.6* 11.0* 12.1 12.1 11.3* 11.2* 10.7*  -Check anemia panel in the morning -Continue monitoring   Body mass index is 26.57 kg/m.           DVT prophylaxis:   apixaban (ELIQUIS) tablet 5 mg  Code Status: Full code Family Communication: None at bedside Level of care: Telemetry Status is: Inpatient Remains inpatient appropriate because: Intractable nausea, vomiting and abdominal pain, hyponatremia, hypokalemia and dehydration   Final disposition: TBD Consultants:  Oncology Gastroenterology  Sch Meds:  Scheduled Meds:  amLODipine  10 mg Oral QHS   [START ON 03/09/2022] apixaban  5 mg Oral BID   Chlorhexidine Gluconate Cloth  6 each Topical Daily   insulin aspart  0-5 Units Subcutaneous QHS   insulin aspart  0-9 Units Subcutaneous TID WC   irbesartan  37.5 mg Oral Daily   pantoprazole  40 mg Oral BID   sodium chloride flush  10-40 mL Intracatheter Q12H   sucralfate  1 g Oral TID WC & HS   Continuous Infusions:  0.9 % NaCl with KCl 20 mEq / L 100 mL/hr at 03/07/22 0342   PRN Meds:.HYDROmorphone (DILAUDID) injection, labetalol, lip balm, morphine injection, ondansetron (ZOFRAN)  IV, mouth rinse, prochlorperazine, sodium chloride flush  Antimicrobials: Anti-infectives (From admission, onward)    None        I have personally reviewed the following labs and images: CBC: Recent Labs  Lab 03/04/22 1136 03/04/22 1449 03/06/22 0838 03/07/22 0408  WBC 8.0 8.5 7.9 8.6  NEUTROABS 6.7 6.7  --   --   HGB 12.1 11.3* 11.2* 10.7*  HCT 35.7* 33.5* 33.5* 32.1*  MCV 84.9 83.1 85.9 86.5  PLT 280.0 258 261 258   BMP &GFR Recent Labs  Lab 03/04/22 1449 03/04/22 2247 03/05/22 0500 03/05/22 1518 03/06/22 0838 03/07/22 0408  NA 124* 131* 132* 131* 126* 131*  K 2.8* 3.0* 3.1* 3.2* 3.8 3.8  CL 84* 92* 94* 94* 93* 99  CO2 '28 28 28 25 24 23  '$ GLUCOSE 139* 104* 93 134* 157* 138*  BUN 5* 5* <5* <5* <5* <5*  CREATININE 0.53 0.41* 0.43* 0.46 0.39* 0.47  CALCIUM 9.0 9.0 9.2 9.3 8.5* 8.7*  MG 2.0  --  2.3  --  1.4* 1.9  PHOS  --   --   --  3.4 3.1 3.4   Estimated Creatinine Clearance: 65.7 mL/min (by C-G formula based on SCr of 0.47 mg/dL). Liver & Pancreas: Recent Labs  Lab 03/04/22  1136 03/04/22 1449 03/05/22 1518 03/06/22 0838 03/07/22 0408  AST 26 24  --   --   --   ALT 20 22  --   --   --   ALKPHOS 72 64  --   --   --   BILITOT 0.7 0.9  --   --   --   PROT 7.9 7.5  --   --   --   ALBUMIN 3.8 3.3* 3.3* 3.0* 2.8*   Recent Labs  Lab 03/04/22 1449  LIPASE 21   No results for input(s): "AMMONIA" in the last 168 hours. Diabetic: No results for input(s): "HGBA1C" in the last 72 hours. Recent Labs  Lab 03/06/22 1237 03/06/22 1608 03/06/22 2155 03/07/22 0745 03/07/22 1116  GLUCAP 163* 118* 151* 139* 116*   Cardiac Enzymes: No results for input(s): "CKTOTAL", "CKMB", "CKMBINDEX", "TROPONINI" in the last 168 hours. No results for input(s): "PROBNP" in the last 8760 hours. Coagulation Profile: Recent Labs  Lab 03/04/22 1136  INR 1.1*   Thyroid Function Tests: No results for input(s): "TSH", "T4TOTAL", "FREET4", "T3FREE", "THYROIDAB" in the  last 72 hours. Lipid Profile: No results for input(s): "CHOL", "HDL", "LDLCALC", "TRIG", "CHOLHDL", "LDLDIRECT" in the last 72 hours. Anemia Panel: No results for input(s): "VITAMINB12", "FOLATE", "FERRITIN", "TIBC", "IRON", "RETICCTPCT" in the last 72 hours. Urine analysis:    Component Value Date/Time   COLORURINE YELLOW 02/15/2022 1059   APPEARANCEUR CLEAR 02/15/2022 1059   LABSPEC 1.013 02/15/2022 1059   PHURINE 7.0 02/15/2022 1059   GLUCOSEU NEGATIVE 02/15/2022 1059   HGBUR SMALL (A) 02/15/2022 1059   BILIRUBINUR NEGATIVE 02/15/2022 1059   BILIRUBINUR NEGATIVE 10/22/2018 1556   [place] 02/15/2022 1059   PROTEINUR NEGATIVE 02/15/2022 1059   UROBILINOGEN 0.2 10/22/2018 1556   NITRITE NEGATIVE 02/15/2022 1059   LEUKOCYTESUR SMALL (A) 02/15/2022 1059   Sepsis Labs: Invalid input(s): "PROCALCITONIN", "LACTICIDVEN"  Microbiology: No results found for this or any previous visit (from the past 240 hour(s)).  Radiology Studies: No results found.    Jah Alarid T. [place]  If 7PM-7AM, please contact night-coverage [email or web address] 03/07/2022, 1:55 PM

## 2022-03-08 ENCOUNTER — Encounter (HOSPITAL_COMMUNITY): Payer: Self-pay | Admitting: Student

## 2022-03-08 ENCOUNTER — Inpatient Hospital Stay (HOSPITAL_COMMUNITY): Payer: BC Managed Care – PPO | Admitting: Anesthesiology

## 2022-03-08 ENCOUNTER — Encounter (HOSPITAL_COMMUNITY)
Admission: EM | Disposition: A | Discharge status: Home / Self Care | Payer: Self-pay | Source: Ambulatory Visit | Attending: Student

## 2022-03-08 DIAGNOSIS — K269 Duodenal ulcer, unspecified as acute or chronic, without hemorrhage or perforation: Secondary | ICD-10-CM | POA: Diagnosis not present

## 2022-03-08 DIAGNOSIS — K315 Obstruction of duodenum: Secondary | ICD-10-CM | POA: Diagnosis not present

## 2022-03-08 DIAGNOSIS — E876 Hypokalemia: Secondary | ICD-10-CM | POA: Diagnosis not present

## 2022-03-08 DIAGNOSIS — K7460 Unspecified cirrhosis of liver: Secondary | ICD-10-CM | POA: Diagnosis not present

## 2022-03-08 DIAGNOSIS — E871 Hypo-osmolality and hyponatremia: Secondary | ICD-10-CM | POA: Diagnosis not present

## 2022-03-08 DIAGNOSIS — I82890 Acute embolism and thrombosis of other specified veins: Secondary | ICD-10-CM | POA: Diagnosis not present

## 2022-03-08 HISTORY — PX: ESOPHAGOGASTRODUODENOSCOPY (EGD) WITH PROPOFOL: SHX5813

## 2022-03-08 LAB — CBC
HCT: 31.8 % — ABNORMAL LOW (ref 36.0–46.0)
Hemoglobin: 10.6 g/dL — ABNORMAL LOW (ref 12.0–15.0)
MCH: 28.8 pg (ref 26.0–34.0)
MCHC: 33.3 g/dL (ref 30.0–36.0)
MCV: 86.4 fL (ref 80.0–100.0)
Platelets: 239 10*3/uL (ref 150–400)
RBC: 3.68 MIL/uL — ABNORMAL LOW (ref 3.87–5.11)
RDW: 16.2 % — ABNORMAL HIGH (ref 11.5–15.5)
WBC: 9.2 10*3/uL (ref 4.0–10.5)
nRBC: 0 % (ref 0.0–0.2)

## 2022-03-08 LAB — RENAL FUNCTION PANEL
Albumin: 2.9 g/dL — ABNORMAL LOW (ref 3.5–5.0)
Anion gap: 6 (ref 5–15)
BUN: <5 mg/dL — ABNORMAL LOW (ref 8–23)
CO2: 24 mmol/L (ref 22–32)
Calcium: 8.7 mg/dL — ABNORMAL LOW (ref 8.9–10.3)
Chloride: 102 mmol/L (ref 98–111)
Creatinine, Ser: 0.47 mg/dL (ref 0.44–1.00)
GFR, Estimated: >60 mL/min (ref >60)
Glucose, Bld: 118 mg/dL — ABNORMAL HIGH (ref 70–99)
Phosphorus: 3.3 mg/dL (ref 2.5–4.6)
Potassium: 3.9 mmol/L (ref 3.5–5.1)
Sodium: 132 mmol/L — ABNORMAL LOW (ref 135–145)

## 2022-03-08 LAB — GLUCOSE, CAPILLARY
Glucose-Capillary: 132 mg/dL — ABNORMAL HIGH (ref 70–99)
Glucose-Capillary: 96 mg/dL (ref 70–99)
Glucose-Capillary: 102 mg/dL — ABNORMAL HIGH (ref 70–99)
Glucose-Capillary: 132 mg/dL — ABNORMAL HIGH (ref 70–99)

## 2022-03-08 LAB — RETICULOCYTES
Immature Retic Fract: 2.8 % (ref 2.3–15.9)
RBC.: 3.69 MIL/uL — ABNORMAL LOW (ref 3.87–5.11)
Retic Count, Absolute: 70.8 10*3/uL (ref 19.0–186.0)
Retic Ct Pct: 1.9 % (ref 0.4–3.1)

## 2022-03-08 LAB — VITAMIN B12: Vitamin B-12: 846 pg/mL (ref 180–914)

## 2022-03-08 LAB — IRON AND TIBC
Iron: 38 ug/dL (ref 28–170)
TIBC: 247 ug/dL — ABNORMAL LOW (ref 250–450)

## 2022-03-08 LAB — HEMOGLOBIN A1C
Hgb A1c MFr Bld: 5.7 % — ABNORMAL HIGH (ref 4.8–5.6)
Mean Plasma Glucose: 116.89 mg/dL

## 2022-03-08 LAB — FERRITIN: Ferritin: 38 ng/mL (ref 11–307)

## 2022-03-08 LAB — FOLATE: Folate: 8.3 ng/mL (ref >5.9)

## 2022-03-08 LAB — MAGNESIUM: Magnesium: 1.6 mg/dL — ABNORMAL LOW (ref 1.7–2.4)

## 2022-03-08 SURGERY — ESOPHAGOGASTRODUODENOSCOPY (EGD) WITH PROPOFOL
Anesthesia: Monitor Anesthesia Care

## 2022-03-08 MED ORDER — MAGNESIUM SULFATE 2 GM/50ML IV SOLN
2.0000 g | Freq: Once | INTRAVENOUS | Status: AC
  Administered 2022-03-08: 2 g via INTRAVENOUS
  Filled 2022-03-08: qty 50

## 2022-03-08 MED ORDER — FENTANYL 25 MCG/HR TD PT72
1.0000 | MEDICATED_PATCH | TRANSDERMAL | Status: DC
  Administered 2022-03-08 – 2022-03-11 (×2): 1 via TRANSDERMAL
  Filled 2022-03-08 (×2): qty 1

## 2022-03-08 MED ORDER — LOPERAMIDE HCL 2 MG PO CAPS
2.0000 mg | ORAL_CAPSULE | ORAL | Status: DC | PRN
  Administered 2022-03-08 – 2022-03-13 (×3): 2 mg via ORAL
  Filled 2022-03-08 (×3): qty 1

## 2022-03-08 MED ORDER — PROPOFOL 500 MG/50ML IV EMUL
INTRAVENOUS | Status: AC
  Filled 2022-03-08: qty 50

## 2022-03-08 MED ORDER — PROPOFOL 500 MG/50ML IV EMUL
INTRAVENOUS | Status: DC | PRN
  Administered 2022-03-08: 135 ug/kg/min via INTRAVENOUS

## 2022-03-08 MED ORDER — LACTATED RINGERS IV SOLN: INTRAVENOUS | Status: DC

## 2022-03-08 MED ORDER — PROPOFOL 10 MG/ML IV BOLUS
INTRAVENOUS | Status: DC | PRN
  Administered 2022-03-08 (×2): 20 mg via INTRAVENOUS

## 2022-03-08 MED ORDER — PROPOFOL 10 MG/ML IV BOLUS
INTRAVENOUS | Status: AC
  Filled 2022-03-08: qty 20

## 2022-03-08 MED ORDER — ONDANSETRON HCL 4 MG/2ML IJ SOLN
4.0000 mg | Freq: Four times a day (QID) | INTRAMUSCULAR | Status: DC
  Administered 2022-03-08 – 2022-03-16 (×25): 4 mg via INTRAVENOUS
  Filled 2022-03-08 (×26): qty 2

## 2022-03-08 SURGICAL SUPPLY — 15 items

## 2022-03-08 NOTE — Interval H&P Note (Signed)
History and Physical Interval Note:  03/08/2022 12:59 PM  Melanie Alvarez  has presented today for surgery, with the diagnosis of pancreatic cancer, persistent nausea/ vomiting.  The various methods of treatment have been discussed with the patient and family. After consideration of risks, benefits and other options for treatment, the patient has consented to  Procedure(s): ESOPHAGOGASTRODUODENOSCOPY (EGD) WITH PROPOFOL (N/A) as a surgical intervention.  The patient's history has been reviewed, patient examined, no change in status, stable for surgery.  I have reviewed the patient's chart and labs.  Questions were answered to the patient's satisfaction.     Pricilla Riffle. Fuller Plan

## 2022-03-08 NOTE — Anesthesia Preprocedure Evaluation (Addendum)
Anesthesia Evaluation  Patient identified by MRN, date of birth, ID band Patient awake    Reviewed: Allergy & Precautions, NPO status , Patient's Chart, lab work & pertinent test results, reviewed documented beta blocker date and time   Airway Mallampati: II  TM Distance: >3 FB Neck ROM: Full    Dental  (+) Dental Advisory Given, Missing   Pulmonary former smoker,  Quit smoking 2021   Pulmonary exam normal breath sounds clear to auscultation       Cardiovascular hypertension, Pt. on medications and Pt. on home beta blockers +CHF (lvef 63-87%, grade 1 diastolic dysfunction)  Normal cardiovascular exam+ Valvular Problems/Murmurs (mild AS) AS  Rhythm:Regular Rate:Normal   portal vein and mesenteric vein thrombosis- on eliquis  Last echo 12/2021 1. Left ventricular ejection fraction, by estimation, is 40 to 45%. The  left ventricle has mildly decreased function. The left ventricle  demonstrates global hypokinesis. Left ventricular diastolic parameters are  consistent with Grade I diastolic  dysfunction (impaired relaxation). Elevated left ventricular end-diastolic  pressure.  2. Right ventricular systolic function is normal. The right ventricular  size is normal.  3. The mitral valve is normal in structure. Trivial mitral valve  regurgitation. No evidence of mitral stenosis.  4. The aortic valve is tricuspid. Aortic valve regurgitation is not  visualized. Mild aortic valve stenosis. Aortic valve area, by VTI measures  1.62 cm. Aortic valve mean gradient measures 11.0 mmHg. Aortic valve Vmax  measures 2.28 m/s.  5. The inferior vena cava is normal in size with greater than 50%  respiratory variability, suggesting right atrial pressure of 3 mmHg.    Neuro/Psych negative neurological ROS  negative psych ROS   GI/Hepatic GERD  Medicated and Controlled,(+) Hepatitis - (HCV s/p treatment), CPancreatic ca, persistent N/V    Endo/Other  diabetes, Well Controlled, Type 2, Oral Hypoglycemic Agents  Renal/GU negative Renal ROS  negative genitourinary   Musculoskeletal  (+) Arthritis , Osteoarthritis,    Abdominal   Peds  Hematology  (+) Blood dyscrasia, anemia , Hb 10.6   Anesthesia Other Findings Chronic pain: dilaudid 85m, fent patch  Reproductive/Obstetrics negative OB ROS                            Anesthesia Physical Anesthesia Plan  ASA: 3  Anesthesia Plan: MAC   Post-op Pain Management: Minimal or no pain anticipated   Induction: Intravenous  PONV Risk Score and Plan: 2 and Propofol infusion and TIVA  Airway Management Planned: Natural Airway and Simple Face Mask  Additional Equipment: None  Intra-op Plan:   Post-operative Plan:   Informed Consent: I have reviewed the patients History and Physical, chart, labs and discussed the procedure including the risks, benefits and alternatives for the proposed anesthesia with the patient or authorized representative who has indicated his/her understanding and acceptance.     Dental advisory given  Plan Discussed with: CRNA  Anesthesia Plan Comments:        Anesthesia Quick Evaluation

## 2022-03-08 NOTE — Progress Notes (Signed)
Melanie Alvarez still is not had the upper endoscopy.  Hopefully, GI will be able to do this today.  Another schedule is quite tight.  She still having the abdominal pain.  I will try her on a Duragesic patch.  I wonder if a celiac block might help her out at some point.  This might be thought about.  I am sure that GI would be able to if they felt this would help her.  Her labs today show white cell count 9.2.  Hemoglobin 10.6.  Platelet count 239,000.  Her BUN is 5 creatinine 0.47.  Her calcium is 8.7.  Her magnesium is 1.6.  She is really not able to eat all that much.  She is pretty much on clear liquids or ice chips because of waiting for the upper endoscopy.  She has had no fever.  There is no bleeding.  There is no cough or shortness of breath.  Hopefully, a Duragesic patch will be able to make a difference with her pain.  Again, possibly, a celiac block might also be effective.  It would be nice if we can somehow try to get her back for chemotherapy.  She has not had chemotherapy for couple months.  In essence, we would have to start over.  I do not think this hepatic abscess that she had is really a problem right now.  I know she is getting incredible care from all the staff up on [address].  Lattie Haw, MD  [place] 14:14

## 2022-03-08 NOTE — Op Note (Signed)
[facility] Patient Name: Melanie Alvarez Procedure Date: 03/08/2022 MRN: [id number] Attending MD: Ladene Artist , MD Date of Birth: 1956/07/24 CSN: [id number] Age: 66 Admit Type: Inpatient Procedure:                Upper GI endoscopy Indications:              Epigastric abdominal pain, Persistent nausea and                            vomiting Providers:                Pricilla Riffle. Fuller Plan, MD, Earnstine Regal, RN, Hinton Dyer                            Technician, Technician Referring MD:             [facility] Medicines:                Monitored Anesthesia Care Complications:            No immediate complications. Estimated Blood Loss:     Estimated blood loss: none. Procedure:                Pre-Anesthesia Assessment:                           - Prior to the procedure, a History and Physical                            was performed, and patient medications and                            allergies were reviewed. The patient's tolerance of                            previous anesthesia was also reviewed. The risks                            and benefits of the procedure and the sedation                            options and risks were discussed with the patient.                            All questions were answered, and informed consent                            was obtained. Prior Anticoagulants: The patient has                            taken no previous anticoagulant or antiplatelet                            agents. ASA Grade Assessment: III - A patient with  severe systemic disease. After reviewing the risks                            and benefits, the patient was deemed in                            satisfactory condition to undergo the procedure.                           After obtaining informed consent, the endoscope was                            passed under direct vision. Throughout the                            procedure, the patient's  blood pressure, pulse, and                            oxygen saturations were monitored continuously. The                            [id number]-[id number] ([id number]) Olympus endoscope was introduced                            through the mouth, and advanced to the third part                            of duodenum. The upper GI endoscopy was                            accomplished without difficulty. The patient                            tolerated the procedure well. Scope In: Scope Out: Findings:      The examined esophagus was normal.      Bilious fluid 600cc was found and suctioned in the gastric fundus and in       the gastric body.      An endoclip previously placed was found in the cardia.      The exam of the stomach was otherwise normal.      SEMS bilary stent in the second portion of the duodenum.      An acquired extrinsic moderate stenosis was found in the second portion       of the duodenum and was traversed.      A few localized erosions without bleeding were found in the second       portion of the duodenum near the SEMS. Impression:               - Normal esophagus.                           - Large volume of bilious gastric fluid.                           - Previously placed endoclip in gastric cardia.                           -  Acquired extrinsic duodenal stenosis.                           - Duodenal erosions without bleeding.                           - No specimens collected. Moderate Sedation:      Not Applicable - Patient had care per Anesthesia. Recommendation:           - Return patient to hospital ward for ongoing care.                           - Clear liquid diet today and if tolerated advance                            tomorrow only to full liquids.                           - Continue present medications.                           - Partial duodenal obstruction secondary to                            pancreatic cancer.                           - If unable to  tolerate full liquids consider                            duodenal stent placement next week. Procedure Code(s):        --- Professional ---                           [phone], Esophagogastroduodenoscopy, flexible,                            transoral; diagnostic, including collection of                            specimen(s) by brushing or washing, when performed                            (separate procedure) Diagnosis Code(s):        --- Professional ---                           K31.5, Obstruction of duodenum                           K26.9, Duodenal ulcer, unspecified as acute or                            chronic, without hemorrhage or perforation                           R10.13, Epigastric pain  P92.92, Cyclical vomiting syndrome unrelated to                            migraine CPT copyright 2019 American Medical Association. All rights reserved. The codes documented in this report are preliminary and upon coder review may  be revised to meet current compliance requirements. Ladene Artist, MD 03/08/2022 1:38:05 PM This report has been signed electronically. Number of Addenda: 0

## 2022-03-08 NOTE — Anesthesia Procedure Notes (Signed)
Procedure Name: MAC Date/Time: 03/08/2022 1:12 PM  Performed by: Niel Hummer, CRNAPre-anesthesia Checklist: Patient identified, Emergency Drugs available, Suction available and Patient being monitored Oxygen Delivery Method: Simple face mask

## 2022-03-08 NOTE — Anesthesia Postprocedure Evaluation (Signed)
Anesthesia Post Note  Patient: Melanie Alvarez  Procedure(s) Performed: ESOPHAGOGASTRODUODENOSCOPY (EGD) WITH PROPOFOL     Patient location during evaluation: Endoscopy Anesthesia Type: MAC Level of consciousness: oriented, awake and alert and awake Pain management: pain level controlled Vital Signs Assessment: post-procedure vital signs reviewed and stable Respiratory status: spontaneous breathing, nonlabored ventilation, respiratory function stable and patient connected to nasal cannula oxygen Cardiovascular status: blood pressure returned to baseline and stable Postop Assessment: no headache, no backache and no apparent nausea or vomiting Anesthetic complications: no   No notable events documented.  Last Vitals:  Vitals:   03/08/22 1400 03/08/22 1402  BP: (!) 169/92 (!) 169/92  Pulse: 85 85  Resp: 16 17  Temp:    SpO2: 99% 97%    Last Pain:  Vitals:   03/08/22 1350  TempSrc:   PainSc: 0-No pain                 Santa Lighter

## 2022-03-08 NOTE — Transfer of Care (Signed)
Immediate Anesthesia Transfer of Care Note  Patient: Melanie Alvarez  Procedure(s) Performed: ESOPHAGOGASTRODUODENOSCOPY (EGD) WITH PROPOFOL  Patient Location: PACU  Anesthesia Type:MAC  Level of Consciousness: drowsy  Airway & Oxygen Therapy: Patient Spontanous Breathing and Patient connected to face mask oxygen  Post-op Assessment: Report given to RN and Post -op Vital signs reviewed and stable  Post vital signs: Reviewed and stable  Last Vitals:  Vitals Value Taken Time  BP 139/72   Temp    Pulse 82   Resp 15   SpO2 100     Last Pain:  Vitals:   03/08/22 1243  TempSrc: Oral  PainSc: 0-No pain      Patients Stated Pain Goal: 2 (76/16/07 3710)  Complications: No notable events documented.

## 2022-03-08 NOTE — Progress Notes (Signed)
PROGRESS NOTE  NANCEE BROWNRIGG WPY:[id number] DOB: [birth year]   PCP: Flossie Buffy, NP  Patient is from: Home.  Lives with husband.  Independently ambulates at baseline.  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  Lab work at the GI office concerning for hyponatremia and hypokalemia, and patient was directed to ED.  Of note, patient was off Eliquis for 2 weeks.  She did not know she was supposed to continue after starter pack.  In ED, Na 122.  K2.8.  CT abdomen and pelvis showed no acute finding but stable mild intra and extrahepatic biliary ductal dilation with palliative metabolic biliary stent in place, stable pancreatic mass with occlusion of the mesenteric and splenic vein.    Electrolytes improved.  Still with intractable nausea, vomiting and abdominal pain.  Oncology and GI consulted.  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  GI recommended CLD and advancing to FLD the next day, and considering duodenal stent placement if unable to tolerate full liquid diet  Subjective: Seen and examined earlier this morning before she went down for EGD.  No major events overnight of this morning.  Continues to endorse nausea and abdominal pain but no emesis.  She states her pain is 7/10 earlier but improved to 4/10 after pain medication.  She states last bowel movement was 2 days ago.  Denies UTI symptoms. Objective: Vitals:   03/08/22 1350 03/08/22  1400 03/08/22 1402 03/08/22 1420  BP: (!) 159/80 (!) 169/92 (!) 169/92 (!) 152/84  Pulse: 83 85 85 89  Resp: '16 16 17 18  '$ Temp:    97.8 F (36.6 C)  TempSrc:    Oral  SpO2: 98% 99% 97% 95%  Weight:      Height:        Examination:  GENERAL: No apparent distress.  Nontoxic. HEENT: MMM.  Vision and hearing grossly intact.  NECK: Supple.  No apparent JVD.  RESP:  No IWOB.  Fair aeration bilaterally. CVS:  RRR. Heart sounds normal.  ABD/GI/GU: BS+. Abd soft.  Some tenderness with palpation across upper abdomen. MSK/EXT:  Moves extremities. No apparent deformity. No edema.  SKIN: no apparent skin lesion or wound NEURO: Awake and alert. Oriented appropriately.  No apparent focal neuro deficit. PSYCH: Calm. Normal affect.   Procedures:  EGD on 7/21 showed normal esophagus, large volume bilious gastric fluid, previously placed Endo Clip in gastric cardia, acquired extrinsic duodenal stenosis and duodenal erosion without bleeding.  Microbiology summarized: None  Assessment and plan: Principal Problem:   Hyponatremia Active Problems:   Hypertension   Hypokalemia   Cirrhosis of liver (HCC)   Malignant neoplasm of pancreas (HCC)   Pancreatic insufficiency   Mesenteric vein thrombosis (HCC)   Acute thrombosis of splenic vein   Intractable nausea and vomiting   Abdominal pain, epigastric   Hypomagnesemia  Intractable nausea/vomiting/abdominal pain: EGD with duodenitis and partial duodenal obstruction due to pancreatic mass.  CT abdomen without acute finding but chronic intra and extra biliary ductal dilation with pancreatic mass.  Still with nausea and abdominal pain.  Tender to palpation over epigastric and LUQ. -Continue IV fluid, antiemetics, PPI, Carafate and analgesics. -Oncology resumed her Duragesic.  Has been out of this at home. -GI recommended CLD and advancing to [place] on 7/22 -Consideration for duodenal stent if she does not tolerate FLD  Hyponatremia: Na 122>> 132>>.   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 -Continue IV fluid  -Discontinued Avapro  Hypokalemia/hypomagnesemia: -Continue NS with KCl -Monitor and replace as appropriate.   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  Patient thought she did not need Eliquis and stopped taking after starter pack. -GI resumed Eliquis starting 7/22   Malignant neoplasm of pancreas: Followed by Dr. Marin Olp.  On systemic chemo. 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 -Continue monitoring  NIDDM-2 with hyperglycemia: A1c 7.5% on 11/27/2021.  On glipizide and metformin at home. Recent Labs  Lab 03/07/22 1116 03/07/22 1651 03/07/22 2023 03/08/22 0757 03/08/22 1121  GLUCAP 116* 125* 92 132* 96  -Continue SSI-sensitive -Check hemoglobin A1c  History of hepatic abscess-treated and resolved.  Status post drain removal on 6/29.  Essential hypertension: Normotensive for most part. -Continue amlodipine -Discontinued Avapro with the hope to help hyponatremia  Normocytic anemia: Slight drop in Hgb likely dilution.  No report of melena or hematochezia.  Anemia panel suggests ACD Recent Labs    01/16/22 0500 01/18/22 0621 01/22/22 0740 01/25/22 1240 02/15/22 0848 03/04/22 1136 03/04/22 1449 03/06/22 0838 03/07/22 0408 03/08/22 0421  HGB 11.0* 11.1* 11.6* 11.0* 12.1 12.1 11.3* 11.2* 10.7* 10.6*  -Continue monitoring   Body mass index is 26.57 kg/m.           DVT prophylaxis:   apixaban (ELIQUIS) tablet 5 mg  Code Status: Full code Family Communication: None at bedside Level of care: Telemetry Status is: Inpatient Remains inpatient appropriate because: Intractable nausea and abdominal pain   Final disposition: TBD Consultants:  Oncology Gastroenterology  Sch Meds:  Scheduled Meds:  amLODipine  10 mg Oral QHS   [START ON 03/09/2022] apixaban  5 mg Oral BID    Chlorhexidine Gluconate Cloth  6 each Topical Daily   fentaNYL  1 patch Transdermal Q72H   insulin aspart  0-5 Units Subcutaneous QHS   insulin aspart  0-9 Units Subcutaneous TID WC   ondansetron (ZOFRAN) IV  4 mg Intravenous Q6H   pantoprazole  40 mg Oral BID   sodium chloride flush  10-40 mL Intracatheter Q12H   Continuous Infusions:  0.9 % NaCl with KCl 20 mEq / L 100 mL/hr at 03/08/22 1124   PRN Meds:.HYDROmorphone (DILAUDID) injection, labetalol, lip balm, morphine injection, mouth rinse, prochlorperazine, sodium chloride flush  Antimicrobials: Anti-infectives (From admission, onward)    None        I have personally reviewed the following labs and images: CBC: Recent Labs  Lab 03/04/22 1136 03/04/22 1449 03/06/22 0838 03/07/22 0408 03/08/22 0421  WBC 8.0 8.5 7.9 8.6 9.2  NEUTROABS 6.7 6.7  --   --   --   HGB 12.1 11.3* 11.2* 10.7* 10.6*  HCT 35.7* 33.5* 33.5* 32.1* 31.8*  MCV 84.9 83.1 85.9 86.5 86.4  PLT 280.0 258 261 258 239   BMP &GFR Recent Labs  Lab 03/04/22 1449 03/04/22 2247 03/05/22 0500 03/05/22 1518 03/06/22 0838 03/07/22 0408 03/08/22 0421  NA 124*   < > 132* 131* 126* 131* 132*  K 2.8*   < > 3.1*  3.2* 3.8 3.8 3.9  CL 84*   < > 94* 94* 93* 99 102  CO2 28   < > '28 25 24 23 24  '$ GLUCOSE 139*   < > 93 134* 157* 138* 118*  BUN 5*   < > <5* <5* <5* <5* <5*  CREATININE 0.53   < > 0.43* 0.46 0.39* 0.47 0.47  CALCIUM 9.0   < > 9.2 9.3 8.5* 8.7* 8.7*  MG 2.0  --  2.3  --  1.4* 1.9 1.6*  PHOS  --   --   --  3.4 3.1 3.4 3.3   < > = values in this interval not displayed.   Estimated Creatinine Clearance: 65.7 mL/min (by C-G formula based on SCr of 0.47 mg/dL). Liver & Pancreas: Recent Labs  Lab 03/04/22 1136 03/04/22 1449 03/05/22 1518 03/06/22 0838 03/07/22 0408 03/08/22 0421  AST 26 24  --   --   --   --   ALT 20 22  --   --   --   --   ALKPHOS 72 64  --   --   --   --   BILITOT 0.7 0.9  --   --   --   --   PROT 7.9 7.5  --   --   --    --   ALBUMIN 3.8 3.3* 3.3* 3.0* 2.8* 2.9*   Recent Labs  Lab 03/04/22 1449  LIPASE 21   No results for input(s): "AMMONIA" in the last 168 hours. Diabetic: No results for input(s): "HGBA1C" in the last 72 hours. Recent Labs  Lab 03/07/22 1116 03/07/22 1651 03/07/22 2023 03/08/22 0757 03/08/22 1121  GLUCAP 116* 125* 92 132* 96   Cardiac Enzymes: No results for input(s): "CKTOTAL", "CKMB", "CKMBINDEX", "TROPONINI" in the last 168 hours. No results for input(s): "PROBNP" in the last 8760 hours. Coagulation Profile: Recent Labs  Lab 03/04/22 1136  INR 1.1*   Thyroid Function Tests: No results for input(s): "TSH", "T4TOTAL", "FREET4", "T3FREE", "THYROIDAB" in the last 72 hours. Lipid Profile: No results for input(s): "CHOL", "HDL", "LDLCALC", "TRIG", "CHOLHDL", "LDLDIRECT" in the last 72 hours. Anemia Panel: Recent Labs    03/08/22 0421  VITAMINB12 846  FOLATE 8.3  FERRITIN 38  TIBC 247*  IRON 38  RETICCTPCT 1.9   Urine analysis:    Component Value Date/Time   COLORURINE YELLOW 02/15/2022 1059   APPEARANCEUR CLEAR 02/15/2022 1059   LABSPEC 1.013 02/15/2022 1059   PHURINE 7.0 02/15/2022 1059   GLUCOSEU NEGATIVE 02/15/2022 1059   HGBUR SMALL (A) 02/15/2022 1059   BILIRUBINUR NEGATIVE 02/15/2022 1059   BILIRUBINUR NEGATIVE 10/22/2018 1556   KETONESUR NEGATIVE 02/15/2022 1059   PROTEINUR NEGATIVE 02/15/2022 1059   UROBILINOGEN 0.2 10/22/2018 1556   NITRITE NEGATIVE 02/15/2022 1059   LEUKOCYTESUR SMALL (A) 02/15/2022 1059   Sepsis Labs: Invalid input(s): "PROCALCITONIN", "LACTICIDVEN"  Microbiology: No results found for this or any previous visit (from the past 240 hour(s)).  Radiology Studies: No results found.    Joneric Streight T. Readlyn  If 7PM-7AM, please contact night-coverage [email or web address] 03/08/2022, 3:17 PM

## 2022-03-09 DIAGNOSIS — R1013 Epigastric pain: Secondary | ICD-10-CM | POA: Diagnosis not present

## 2022-03-09 DIAGNOSIS — E876 Hypokalemia: Secondary | ICD-10-CM | POA: Diagnosis not present

## 2022-03-09 DIAGNOSIS — K831 Obstruction of bile duct: Secondary | ICD-10-CM

## 2022-03-09 DIAGNOSIS — R112 Nausea with vomiting, unspecified: Secondary | ICD-10-CM | POA: Diagnosis not present

## 2022-03-09 DIAGNOSIS — C250 Malignant neoplasm of head of pancreas: Secondary | ICD-10-CM

## 2022-03-09 DIAGNOSIS — K315 Obstruction of duodenum: Secondary | ICD-10-CM

## 2022-03-09 DIAGNOSIS — E871 Hypo-osmolality and hyponatremia: Secondary | ICD-10-CM | POA: Diagnosis not present

## 2022-03-09 DIAGNOSIS — K7460 Unspecified cirrhosis of liver: Secondary | ICD-10-CM | POA: Diagnosis not present

## 2022-03-09 DIAGNOSIS — I82890 Acute embolism and thrombosis of other specified veins: Secondary | ICD-10-CM | POA: Diagnosis not present

## 2022-03-09 LAB — CBC
HCT: 32.6 % — ABNORMAL LOW (ref 36.0–46.0)
Hemoglobin: 10.9 g/dL — ABNORMAL LOW (ref 12.0–15.0)
MCH: 28.9 pg (ref 26.0–34.0)
MCHC: 33.4 g/dL (ref 30.0–36.0)
MCV: 86.5 fL (ref 80.0–100.0)
Platelets: 232 10*3/uL (ref 150–400)
RBC: 3.77 MIL/uL — ABNORMAL LOW (ref 3.87–5.11)
RDW: 16.2 % — ABNORMAL HIGH (ref 11.5–15.5)
WBC: 17.7 10*3/uL — ABNORMAL HIGH (ref 4.0–10.5)
nRBC: 0 % (ref 0.0–0.2)

## 2022-03-09 LAB — COMPREHENSIVE METABOLIC PANEL
ALT: 14 U/L (ref 0–44)
AST: 15 U/L (ref 15–41)
Albumin: 2.5 g/dL — ABNORMAL LOW (ref 3.5–5.0)
Alkaline Phosphatase: 54 U/L (ref 38–126)
Anion gap: 5 (ref 5–15)
BUN: 6 mg/dL — ABNORMAL LOW (ref 8–23)
CO2: 23 mmol/L (ref 22–32)
Calcium: 8 mg/dL — ABNORMAL LOW (ref 8.9–10.3)
Chloride: 102 mmol/L (ref 98–111)
Creatinine, Ser: 0.54 mg/dL (ref 0.44–1.00)
GFR, Estimated: >60 mL/min (ref >60)
Glucose, Bld: 151 mg/dL — ABNORMAL HIGH (ref 70–99)
Potassium: 4 mmol/L (ref 3.5–5.1)
Sodium: 130 mmol/L — ABNORMAL LOW (ref 135–145)
Total Bilirubin: 0.9 mg/dL (ref 0.3–1.2)
Total Protein: 6.2 g/dL — ABNORMAL LOW (ref 6.5–8.1)

## 2022-03-09 LAB — MAGNESIUM: Magnesium: 1.7 mg/dL (ref 1.7–2.4)

## 2022-03-09 LAB — GLUCOSE, CAPILLARY
Glucose-Capillary: 142 mg/dL — ABNORMAL HIGH (ref 70–99)
Glucose-Capillary: 102 mg/dL — ABNORMAL HIGH (ref 70–99)
Glucose-Capillary: 136 mg/dL — ABNORMAL HIGH (ref 70–99)
Glucose-Capillary: 74 mg/dL (ref 70–99)

## 2022-03-09 LAB — PHOSPHORUS: Phosphorus: 3.8 mg/dL (ref 2.5–4.6)

## 2022-03-09 MED ORDER — HEPARIN (PORCINE) 25000 UT/250ML-% IV SOLN
1150.0000 [IU]/h | INTRAVENOUS | Status: AC
  Administered 2022-03-09 – 2022-03-12 (×3): 1000 [IU]/h via INTRAVENOUS
  Filled 2022-03-09 (×3): qty 250

## 2022-03-09 NOTE — Progress Notes (Signed)
    Progress Note   Assessment    Metastatic pancreatic cancer with persistent nausea ,vomiting and epigastric pain Duodenal stenosis, partial obstruction, extrinsic compression from pancreatic cancer Biliary obstruction with SEMS biliary stent in place Splenic vein and SMV thrombosis  Hyponatremia  Recommendations   Clear liquids and if tolerated advance to full liquid. If she has persistent vomiting or is unable to take adequate full liquids then consider a palliative duodenal stent. Our [facility] inpatient team will review with our advanced endoscopists on Monday.  Continue to hold Eliquis   Chief Complaint   Nausea, vomiting last night. Feels better this morning  Vital signs in last 24 hours: Temp:  [97.8 F (36.6 C)-99.3 F (37.4 C)] 99.3 F (37.4 C) (07/22 0406) Pulse Rate:  [82-109] 106 (07/22 0406) Resp:  [13-24] 17 (07/22 0635) BP: (118-169)/(72-92) 118/75 (07/22 0406) SpO2:  [94 %-100 %] 94 % (07/22 0406) Weight:  [68 kg] 68 kg (07/21 1243) Last BM Date : 03/08/22  General: Alert, well-developed, in NAD Heart:  Regular rate and rhythm; no murmurs Chest: Clear to ascultation bilaterally Abdomen:  Soft, epigastric tenderness and nondistended. Normal bowel sounds, without guarding, and without rebound.   Extremities:  Without edema. Neurologic:  Alert and  oriented x4; grossly normal neurologically. Psych:  Alert and cooperative. Normal mood and affect.  Intake/Output from previous day: 07/21 0701 - 07/22 0700 In: 776.7 [P.O.:120; I.V.:656.7] Out: -  Intake/Output this shift: Total I/O In: 360 [P.O.:360] Out: -   Lab Results: Recent Labs    03/07/22 0408 03/08/22 0421 03/09/22 0354  WBC 8.6 9.2 17.7*  HGB 10.7* 10.6* 10.9*  HCT 32.1* 31.8* 32.6*  PLT 258 239 232   BMET Recent Labs    03/07/22 0408 03/08/22 0421 03/09/22 0354  NA 131* 132* 130*  K 3.8 3.9 4.0  CL 99 102 102  CO2 '23 24 23  '$ GLUCOSE 138* 118* 151*  BUN <5* <5* 6*  CREATININE 0.47  0.47 0.54  CALCIUM 8.7* 8.7* 8.0*   LFT Recent Labs    03/09/22 0354  PROT 6.2*  ALBUMIN 2.5*  AST 15  ALT 14  ALKPHOS 54  BILITOT 0.9     LOS: 4 days   Wyndell Cardiff T. Fuller Plan, MD 03/09/2022, 10:05 AM See [facility], to contact our on call provider

## 2022-03-09 NOTE — Progress Notes (Signed)
[facility] for IV heparin Indication: splenic/SMV thrombosis  Allergies  Allergen Reactions   Lisinopril Swelling and Other (See Comments)    Facial and upper lip    Patient Measurements: Height: '5\' 3"'$  (160 cm) Weight: 68 kg (150 lb) IBW/kg (Calculated) : 52.4 Heparin Dosing Weight: 66  Vital Signs: Temp: 98.4 F (36.9 C) (07/22 1150) Temp Source: Oral (07/22 1150) BP: 124/72 (07/22 1150) Pulse Rate: 97 (07/22 1150)  Labs: Recent Labs    03/07/22 0408 03/08/22 0421 03/09/22 0354  HGB 10.7* 10.6* 10.9*  HCT 32.1* 31.8* 32.6*  PLT 258 239 232  CREATININE 0.47 0.47 0.54    Estimated Creatinine Clearance: 65.7 mL/min (by C-G formula based on SCr of 0.54 mg/dL).   Medical History: Past Medical History:  Diagnosis Date   Cancer ([place])    Cirrhosis ([place])    Diabetes mellitus 2012   Diverticulosis    Gallbladder sludge    Hepatitis 2010   history of Hepatitis C   Hepatitis C    Hypertension 2011   Obesity     Medications:  Medications Prior to Admission  Medication Sig Dispense Refill Last Dose   amLODipine (NORVASC) 10 MG tablet Take 1 tablet (10 mg total) by mouth at bedtime. 90 tablet 3 Past Week   apixaban (ELIQUIS) 5 MG TABS tablet Take 1 tablet (5 mg total) by mouth 2 (two) times daily. 60 tablet 0 over 2 weeks   atorvastatin (LIPITOR) 20 MG tablet Take 1 tablet (20 mg total) by mouth at bedtime. 90 tablet 3 03/03/2022   CREON 36000-114000 units CPEP capsule TAKE 2 CAPSULES BY MOUTH THREE TIMES DAILY BEFORE MEAL(S) (Patient taking differently: Take 72,000 Units by mouth 3 (three) times daily before meals.) 180 capsule 0 03/04/2022   glipiZIDE (GLUCOTROL XL) 5 MG 24 hr tablet Take 1 tablet (5 mg total) by mouth daily with breakfast. 90 tablet 1 03/04/2022   HYDROmorphone (DILAUDID) 4 MG tablet Take 1 tablet (4 mg total) by mouth every 4 (four) hours as needed for severe pain. 90 tablet 0 03/03/2022   olmesartan (BENICAR) 5 MG  tablet Take 1 tablet by mouth once daily 30 tablet 0 03/04/2022   ALPRAZolam (XANAX) 1 MG tablet Take 0.5 tablets (0.5 mg total) by mouth every 8 (eight) hours as needed for anxiety. 30 tablet 0    fentaNYL (DURAGESIC) 25 MCG/HR Place 1 patch onto the skin every 3 (three) days. (Patient not taking: Reported on 03/04/2022) 10 patch 0 Completed Course   metFORMIN (GLUCOPHAGE XR) 750 MG 24 hr tablet Take 1 tablet (750 mg total) by mouth daily with breakfast. (Patient not taking: Reported on 03/04/2022) 90 tablet 1 Not Taking   metoprolol succinate (TOPROL-XL) 25 MG 24 hr tablet TAKE 1 TABLET BY MOUTH ONCE DAILY (Patient not taking: Reported on 03/04/2022) 90 tablet 3 Completed Course   ondansetron (ZOFRAN) 8 MG tablet Take 1 tablet (8 mg total) by mouth 2 (two) times daily as needed (Nausea or vomiting). 30 tablet 1    pantoprazole (PROTONIX) 40 MG tablet Take 1 tablet (40 mg total) by mouth 2 (two) times daily for 14 days. (Patient not taking: Reported on 01/15/2022) 28 tablet 0    pantoprazole (PROTONIX) 40 MG tablet Take 1 tablet (40 mg total) by mouth daily. 30 tablet 5 unknown   potassium chloride (KLOR-CON M) 20 MEQ tablet Take 1 tablet (20 mEq total) by mouth 2 (two) times daily. (Patient not taking: Reported on 01/15/2022) 10  tablet 0 Completed Course   prochlorperazine (COMPAZINE) 10 MG tablet Take 1 tablet (10 mg total) by mouth every 6 (six) hours as needed (Nausea or vomiting). (Patient not taking: Reported on 03/04/2022) 30 tablet 1 Completed Course   sucralfate (CARAFATE) 1 g tablet Take 1 tablet (1 g total) by mouth 2 (two) times daily. 40 tablet 0    Scheduled:   amLODipine  10 mg Oral QHS   Chlorhexidine Gluconate Cloth  6 each Topical Daily   fentaNYL  1 patch Transdermal Q72H   insulin aspart  0-5 Units Subcutaneous QHS   insulin aspart  0-9 Units Subcutaneous TID WC   ondansetron (ZOFRAN) IV  4 mg Intravenous Q6H   pantoprazole  40 mg Oral BID   sodium chloride flush  10-40 mL  Intracatheter Q12H   PRN: HYDROmorphone (DILAUDID) injection, labetalol, lip balm, loperamide, morphine injection, mouth rinse, prochlorperazine, sodium chloride flush  Assessment: 70 yoF with PMH cirrhosis, HepC, recurrent pancreatic cancer on chemo, portal vein thrombosis on Eliquis PTA, admitted for electrolyte derangements, and 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 May need esophageal/duodenal stenting and Eliquis held for potential GI procedures with Pharmacy to dose IV heparin in the meantime.  Baseline INR, aPTT: not done Prior anticoagulation: Eliquis 5 mg PO bid, LD 7/22 @ 0800  Significant events:  Today, 03/09/2022: CBC: Hgb low but stable; Plt stable WNL No bleeding or infusion issues per nursing SCr stable WNL  Goal of Therapy: Heparin level 0.3-0.7 units/ml Monitor platelets by anticoagulation protocol: Yes  Plan: Start Heparin 1000 units/hr IV infusion 12 hr after this morning's Eliquis dose Check heparin level and aPTT 8 hrs after start - may or may not see DOAC interference after only one dose Daily CBC, daily heparin level once stable Monitor for signs of bleeding or worsening thrombosis  Reuel Boom, PharmD, BCPS [phone] 03/09/2022, 5:43 PM

## 2022-03-09 NOTE — Progress Notes (Signed)
   Ms. Sturgeon underwent her upper endoscopy yesterday.  There was a stricture in the duodenum.  It appears this to be extrinsic.  I did not suspect this is probably from her underlying malignancy.  It looks like there may be plan to put in an esophageal stent.  She is still having some abdominal pain.  She is on a Duragesic patch.  Hopefully, this will start helping her today.  Her labs today show sodium 130.  Potassium 4.0.  BUN 6 creatinine 0.54.  Calcium is 8 with an albumin of 2.5.  Her white cell count is 17.7.  Hemoglobin 10.9.  Platelet count 232,000.    She is on Eliquis.  There is no bleeding.  Her vital signs show temperature of 99.3.  Pulse 106.  Blood pressure 118/75.  Her lungs are clear.  She has good air movement bilaterally.  Cardiac exam regular rate and rhythm.  Abdomen is soft.  Bowel sounds are present.  There is some tenderness in the epigastric area to palpation.  There is no fluid wave.  Extremity shows no clubbing, cyanosis or edema.  Neurological exam is nonfocal.  At this point, I think we have to await the final plan to see about a duodenal stent.  This was certainly help her.  We ultimately will have to treat the cancer.  We would not be able to treat this for a couple months because of her being hospitalized and having the hepatic abscess.  Hopefully, she will get the stent in and then she will be discharged if she is able to keep food down.  We can then try to treat her as an outpatient.  Nutrition will be critical for her.  Hopefully, she will be able to eat a little bit more substantial food if she does get a stent in.  Lattie Haw, MD  1 Thessalonians 5:15

## 2022-03-09 NOTE — Progress Notes (Addendum)
PROGRESS NOTE  Melanie Alvarez EXB:[id number] DOB: 1955-12-13   PCP: Flossie Buffy, NP  Patient is from: Home.  Lives with husband.  Independently ambulates at baseline.  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  Lab work at the GI office concerning for hyponatremia and hypokalemia, and patient was directed to ED.  Of note, patient was off Eliquis for 2 weeks.  She did not know she was supposed to continue after starter pack.  In ED, Na 122.  K2.8.  CT abdomen and pelvis showed no acute finding but stable mild intra and extrahepatic biliary ductal dilation with palliative metabolic biliary stent in place, stable pancreatic mass with occlusion of the mesenteric and splenic vein.    Electrolytes improved.  Still with intractable nausea, vomiting and abdominal pain.  Oncology and GI consulted.  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  GI following.  Subjective: Seen and examined earlier this morning.  She reports nausea, 2 episodes of emesis and diarrhea from last night to this morning.  Continues to endorse abdominal pain.  She rates her pain 8/10.  Pain is mainly over left abdomen.   Objective: Vitals:   03/09/22 0406 03/09/22 0600 03/09/22 0635 03/09/22 1150  BP: 118/75   124/72  Pulse: (!) 106   97  Resp: '18 20 17 18  '$ Temp: 99.3 F (37.4 C)   98.4 F (36.9 C)  TempSrc: Oral   Oral  SpO2: 94%   99%  Weight:      Height:         Examination:  GENERAL: No apparent distress.  Nontoxic. HEENT: MMM.  Vision and hearing grossly intact.  NECK: Supple.  No apparent JVD.  RESP:  No IWOB.  Fair aeration bilaterally. CVS:  RRR. Heart sounds normal.  ABD/GI/GU: BS+. Abd soft.  LUQ and LMQ tenderness MSK/EXT:  Moves extremities. No apparent deformity. No edema.  SKIN: no apparent skin lesion or wound NEURO: Awake and alert. Oriented appropriately.  No apparent focal neuro deficit. PSYCH: Calm. Normal affect.   Procedures:  EGD on 7/21 showed normal esophagus, large volume bilious gastric fluid, previously placed Endo Clip in gastric cardia, acquired extrinsic duodenal stenosis and duodenal erosion without bleeding.  Microbiology summarized: None  Assessment and plan: Principal Problem:   Hyponatremia Active Problems:   Hypertension   Hypokalemia   Cirrhosis of liver (HCC)   Malignant neoplasm of pancreas (HCC)   Pancreatic insufficiency   Mesenteric vein thrombosis (HCC)   Acute thrombosis of splenic vein   Intractable nausea and vomiting   Abdominal pain, epigastric   Hypomagnesemia   Duodenal obstruction, acquired  Intractable nausea/vomiting/abdominal pain: EGD with duodenitis and partial duodenal obstruction due to pancreatic mass.  CT abdomen without acute finding but chronic intra and extra biliary ductal dilation with pancreatic mass.  Not tolerating clear liquid diet.  She states she had nausea, emesis, pain and diarrhea.  -Continue IV fluid, antiemetics, PPI, Carafate and analgesics. -Oncology resumed her Duragesic.  Has been out of this  at home. -GI following-recommends continuing clear liquid diet and holding Eliquis -Changed Eliquis to IV heparin in case of any procedure  Hyponatremia: Na 122>> 132>>.  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 -Continue IV fluid  -Discontinued Avapro  Hypokalemia/hypomagnesemia: -Continue NS with KCl -Monitor and replace as appropriate.    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  Patient thought she did not need Eliquis and stopped taking after starter pack. -Changed Eliquis to IV heparin in case of further GI procedure   Malignant neoplasm of pancreas: Followed by Dr. Marin Olp.  On systemic chemo. 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 -Continue monitoring  Controlled NIDDM-2 with hyperglycemia: A1c 5.7%.  On glipizide and metformin at home. Recent Labs  Lab 03/08/22 1630 03/08/22 2038 03/09/22 0746 03/09/22 1147 03/09/22 1631  GLUCAP 102* 132* 142* 102* 136*  -Continue SSI-sensitive  History of hepatic abscess-treated and resolved.  Status post drain removal on 6/29.  Essential hypertension: Normotensive for most part. -Continue amlodipine -Discontinued Avapro with the hope to help hyponatremia  Normocytic anemia: Slight drop in Hgb likely dilution.  No report of melena or hematochezia.  Anemia panel suggests ACD Recent Labs    01/18/22 0621 01/22/22 0740 01/25/22 1240 02/15/22 0848 03/04/22 1136 03/04/22 1449 03/06/22 0838 03/07/22 0408 03/08/22 0421 03/09/22 0354  HGB 11.1* 11.6* 11.0* 12.1 12.1 11.3* 11.2* 10.7* 10.6* 10.9*  -Continue monitoring   Body mass index is 26.57 kg/m.           DVT prophylaxis:  On full dose anticoagulation with IV heparin  Code Status: Full code Family Communication: None at bedside Level of care: Telemetry Status is: Inpatient Remains inpatient appropriate because: Intractable nausea and abdominal pain   Final disposition: TBD Consultants:  Oncology Gastroenterology  Sch Meds:  Scheduled Meds:  amLODipine  10 mg Oral QHS   Chlorhexidine Gluconate Cloth  6 each Topical Daily   fentaNYL  1 patch Transdermal Q72H   insulin aspart  0-5 Units Subcutaneous QHS   insulin aspart  0-9 Units Subcutaneous TID WC   ondansetron (ZOFRAN) IV  4 mg  Intravenous Q6H   pantoprazole  40 mg Oral BID   sodium chloride flush  10-40 mL Intracatheter Q12H   Continuous Infusions:  0.9 % NaCl with KCl 20 mEq / L 100 mL/hr at 03/09/22 0957   PRN Meds:.HYDROmorphone (DILAUDID) injection, labetalol, lip balm, loperamide, morphine injection, mouth rinse, prochlorperazine, sodium chloride flush  Antimicrobials: Anti-infectives (From admission, onward)    None        I have personally reviewed the following labs and images: CBC: Recent Labs  Lab 03/04/22 1136 03/04/22 1449 03/06/22 0838 03/07/22 0408 03/08/22 0421 03/09/22 0354  WBC 8.0 8.5 7.9 8.6 9.2 17.7*  NEUTROABS 6.7 6.7  --   --   --   --   HGB 12.1 11.3* 11.2* 10.7* 10.6* 10.9*  HCT 35.7* 33.5* 33.5* 32.1* 31.8* 32.6*  MCV 84.9 83.1 85.9 86.5 86.4 86.5  PLT 280.0 258 261 258 239 232   BMP &GFR Recent Labs  Lab 03/05/22 0500 03/05/22 1518 03/06/22 0838 03/07/22 0408 03/08/22 0421 03/09/22 0354  NA 132* 131* 126* 131* 132* 130*  K 3.1* 3.2* 3.8 3.8 3.9 4.0  CL 94* 94* 93* 99 102 102  CO2 '28 25 24 23 24 23  '$ GLUCOSE 93 134* 157* 138* 118* 151*  BUN <5* <5* <5* <5* <5* 6*  CREATININE 0.43*  0.46 0.39* 0.47 0.47 0.54  CALCIUM 9.2 9.3 8.5* 8.7* 8.7* 8.0*  MG 2.3  --  1.4* 1.9 1.6* 1.7  PHOS  --  3.4 3.1 3.4 3.3 3.8   Estimated Creatinine Clearance: 65.7 mL/min (by C-G formula based on SCr of 0.54 mg/dL). Liver & Pancreas: Recent Labs  Lab 03/04/22 1136 03/04/22 1449 03/05/22 1518 03/06/22 0838 03/07/22 0408 03/08/22 0421 03/09/22 0354  AST 26 24  --   --   --   --  15  ALT 20 22  --   --   --   --  14  ALKPHOS 72 64  --   --   --   --  54  BILITOT 0.7 0.9  --   --   --   --  0.9  PROT 7.9 7.5  --   --   --   --  6.2*  ALBUMIN 3.8 3.3* 3.3* 3.0* 2.8* 2.9* 2.5*   Recent Labs  Lab 03/04/22 1449  LIPASE 21   No results for input(s): "AMMONIA" in the last 168 hours. Diabetic: Recent Labs    03/08/22 0421  HGBA1C 5.7*   Recent Labs  Lab  03/08/22 1630 03/08/22 2038 03/09/22 0746 03/09/22 1147 03/09/22 1631  GLUCAP 102* 132* 142* 102* 136*   Cardiac Enzymes: No results for input(s): "CKTOTAL", "CKMB", "CKMBINDEX", "TROPONINI" in the last 168 hours. No results for input(s): "PROBNP" in the last 8760 hours. Coagulation Profile: Recent Labs  Lab 03/04/22 1136  INR 1.1*   Thyroid Function Tests: No results for input(s): "TSH", "T4TOTAL", "FREET4", "T3FREE", "THYROIDAB" in the last 72 hours. Lipid Profile: No results for input(s): "CHOL", "HDL", "LDLCALC", "TRIG", "CHOLHDL", "LDLDIRECT" in the last 72 hours. Anemia Panel: Recent Labs    03/08/22 0421  VITAMINB12 846  FOLATE 8.3  FERRITIN 38  TIBC 247*  IRON 38  RETICCTPCT 1.9   Urine analysis:    Component Value Date/Time   COLORURINE YELLOW 02/15/2022 1059   APPEARANCEUR CLEAR 02/15/2022 1059   LABSPEC 1.013 02/15/2022 1059   PHURINE 7.0 02/15/2022 1059   GLUCOSEU NEGATIVE 02/15/2022 1059   HGBUR SMALL (A) 02/15/2022 1059   BILIRUBINUR NEGATIVE 02/15/2022 1059   BILIRUBINUR NEGATIVE 10/22/2018 1556   KETONESUR NEGATIVE 02/15/2022 1059   PROTEINUR NEGATIVE 02/15/2022 1059   UROBILINOGEN 0.2 10/22/2018 1556   NITRITE NEGATIVE 02/15/2022 1059   LEUKOCYTESUR SMALL (A) 02/15/2022 1059   Sepsis Labs: Invalid input(s): "PROCALCITONIN", "LACTICIDVEN"  Microbiology: No results found for this or any previous visit (from the past 240 hour(s)).  Radiology Studies: No results found.    Jedrick Hutcherson T. [place]  If 7PM-7AM, please contact night-coverage [email or web address] 03/09/2022, 5:23 PM

## 2022-03-10 DIAGNOSIS — E876 Hypokalemia: Secondary | ICD-10-CM | POA: Diagnosis not present

## 2022-03-10 DIAGNOSIS — R112 Nausea with vomiting, unspecified: Secondary | ICD-10-CM | POA: Diagnosis not present

## 2022-03-10 DIAGNOSIS — K315 Obstruction of duodenum: Secondary | ICD-10-CM | POA: Diagnosis not present

## 2022-03-10 DIAGNOSIS — K7460 Unspecified cirrhosis of liver: Secondary | ICD-10-CM | POA: Diagnosis not present

## 2022-03-10 DIAGNOSIS — R1013 Epigastric pain: Secondary | ICD-10-CM | POA: Diagnosis not present

## 2022-03-10 DIAGNOSIS — Z7409 Other reduced mobility: Secondary | ICD-10-CM

## 2022-03-10 DIAGNOSIS — I82890 Acute embolism and thrombosis of other specified veins: Secondary | ICD-10-CM | POA: Diagnosis not present

## 2022-03-10 DIAGNOSIS — E871 Hypo-osmolality and hyponatremia: Secondary | ICD-10-CM | POA: Diagnosis not present

## 2022-03-10 LAB — RENAL FUNCTION PANEL
Albumin: 2.3 g/dL — ABNORMAL LOW (ref 3.5–5.0)
Anion gap: 6 (ref 5–15)
BUN: <5 mg/dL — ABNORMAL LOW (ref 8–23)
CO2: 22 mmol/L (ref 22–32)
Calcium: 8.2 mg/dL — ABNORMAL LOW (ref 8.9–10.3)
Chloride: 102 mmol/L (ref 98–111)
Creatinine, Ser: 0.53 mg/dL (ref 0.44–1.00)
GFR, Estimated: >60 mL/min (ref >60)
Glucose, Bld: 102 mg/dL — ABNORMAL HIGH (ref 70–99)
Phosphorus: 3 mg/dL (ref 2.5–4.6)
Potassium: 3.9 mmol/L (ref 3.5–5.1)
Sodium: 130 mmol/L — ABNORMAL LOW (ref 135–145)

## 2022-03-10 LAB — CBC
HCT: 31.5 % — ABNORMAL LOW (ref 36.0–46.0)
Hemoglobin: 10.5 g/dL — ABNORMAL LOW (ref 12.0–15.0)
MCH: 28.7 pg (ref 26.0–34.0)
MCHC: 33.3 g/dL (ref 30.0–36.0)
MCV: 86.1 fL (ref 80.0–100.0)
Platelets: 220 10*3/uL (ref 150–400)
RBC: 3.66 MIL/uL — ABNORMAL LOW (ref 3.87–5.11)
RDW: 16.1 % — ABNORMAL HIGH (ref 11.5–15.5)
WBC: 13.5 10*3/uL — ABNORMAL HIGH (ref 4.0–10.5)
nRBC: 0 % (ref 0.0–0.2)

## 2022-03-10 LAB — MAGNESIUM: Magnesium: 1.5 mg/dL — ABNORMAL LOW (ref 1.7–2.4)

## 2022-03-10 LAB — GLUCOSE, CAPILLARY
Glucose-Capillary: 133 mg/dL — ABNORMAL HIGH (ref 70–99)
Glucose-Capillary: 171 mg/dL — ABNORMAL HIGH (ref 70–99)
Glucose-Capillary: 134 mg/dL — ABNORMAL HIGH (ref 70–99)
Glucose-Capillary: 146 mg/dL — ABNORMAL HIGH (ref 70–99)

## 2022-03-10 LAB — HEPARIN LEVEL (UNFRACTIONATED): Heparin Unfractionated: >1.1 IU/mL — ABNORMAL HIGH (ref 0.30–0.70)

## 2022-03-10 LAB — APTT
aPTT: 84 seconds — ABNORMAL HIGH (ref 24–36)
aPTT: 84 seconds — ABNORMAL HIGH (ref 24–36)

## 2022-03-10 MED ORDER — MAGNESIUM SULFATE 2 GM/50ML IV SOLN
2.0000 g | Freq: Once | INTRAVENOUS | Status: AC
  Administered 2022-03-10: 2 g via INTRAVENOUS
  Filled 2022-03-10: qty 50

## 2022-03-10 NOTE — Progress Notes (Signed)
PROGRESS NOTE  Melanie Alvarez KCL:[id number] DOB: 06-25-1956   PCP: Flossie Buffy, NP  Patient is from: Home.  Lives with husband.  Independently ambulates at baseline.  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  Lab work at the GI office concerning for hyponatremia and hypokalemia, and patient was directed to ED.  Of note, patient was off Eliquis for 2 weeks.  She did not know she was supposed to continue after starter pack.  In ED, Na 122.  K2.8.  CT abdomen and pelvis showed no acute finding but stable mild intra and extrahepatic biliary ductal dilation with palliative metabolic biliary stent in place, stable pancreatic mass with occlusion of the mesenteric and splenic vein.    Electrolytes improved.  Still with intractable nausea, vomiting and abdominal pain.  Oncology and GI consulted.  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  GI considering duodenal stent.  Subjective: Seen and examined earlier this morning.  No major events overnight or this morning.  Continues to endorse nausea and abdominal pain.  No emesis or diarrhea.  Has not gotten out of the bed yet  Objective: Vitals:   03/09/22 1150 03/09/22 2020 03/10/22 0544 03/10/22 1230  BP: 124/72 (!) 150/95 129/83 115/71  Pulse: 97 (!) 107 99 96  Resp: '18 18 18 18  '$ Temp: 98.4 F (36.9 C) 100 F (37.8 C) 99.2 F (37.3 C) 98.1 F (36.7 C)  TempSrc: Oral Oral Oral Oral  SpO2:  99% 95% 95% 100%  Weight:      Height:        Examination:  GENERAL: No apparent distress.  Nontoxic. HEENT: MMM.  Vision and hearing grossly intact.  NECK: Supple.  No apparent JVD.  RESP:  No IWOB.  Fair aeration bilaterally. CVS:  RRR. Heart sounds normal.  ABD/GI/GU: BS+. Abd soft.  Mild epigastric, LUQ and LMQ tenderness. MSK/EXT:  Moves extremities. No apparent deformity. No edema.  SKIN: no apparent skin lesion or wound NEURO: Awake and alert. Oriented appropriately.  No apparent focal neuro deficit. PSYCH: Calm. Normal affect.   Procedures:  EGD on 7/21 showed normal esophagus, large volume bilious gastric fluid, previously placed Endo Clip in gastric cardia, acquired extrinsic duodenal stenosis and duodenal erosion without bleeding.  Microbiology summarized: None  Assessment and plan: Principal Problem:   Hyponatremia Active Problems:   Hypertension   Hypokalemia   Cirrhosis of liver (HCC)   Malignant neoplasm of pancreas (HCC)   Pancreatic insufficiency   Mesenteric vein thrombosis (HCC)   Acute thrombosis of splenic vein   Intractable nausea and vomiting   Abdominal pain, epigastric   Hypomagnesemia   Duodenal obstruction, acquired   Biliary obstruction  Intractable nausea/vomiting/abdominal pain: EGD with duodenitis and partial duodenal obstruction due to pancreatic mass.  CT abdomen without acute finding but chronic intra and extra biliary ductal dilation with pancreatic mass.  Continues to endorse nausea and abdominal pain even with CLD -Continue IV fluid, antiemetics, PPI, Carafate and analgesics. -Oncology resumed her Duragesic.  Has been  out of this at home. -GI following-recommends continuing clear liquid diet and holding Eliquis -Changed Eliquis to IV heparin in case of any procedure  Hyponatremia: Na 122>> 132>> 130.  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 -Continue IV fluid   Hypokalemia/hypomagnesemia: -Continue NS with  KCl -Monitor and replace as appropriate.   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  Patient thought she did not need Eliquis and stopped taking after starter pack. -Changed Eliquis to IV heparin in case of further GI procedure   Malignant neoplasm of pancreas: Followed by Dr. Marin Olp.  On systemic chemo. 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 -Continue monitoring  Controlled NIDDM-2 with hyperglycemia: A1c 5.7%.  On glipizide and metformin at home. Recent Labs  Lab 03/09/22 1147 03/09/22 1631 03/09/22 2020 03/10/22 0742 03/10/22 1228  GLUCAP 102* 136* 74 133* 171*  -Continue SSI-sensitive  History of hepatic abscess-treated and resolved.  Status post drain removal on 6/29.  Essential hypertension: Normotensive for most part. -Continue amlodipine -Discontinued Avapro with the hope to help hyponatremia  Normocytic anemia: No report of melena or hematochezia.  Anemia panel suggests ACD Recent Labs    01/22/22 0740 01/25/22 1240 02/15/22 0848 03/04/22 1136 03/04/22 1449 03/06/22 0838 03/07/22 0408 03/08/22 0421 03/09/22 0354 03/10/22 0325  HGB 11.6* 11.0* 12.1 12.1 11.3* 11.2* 10.7* 10.6* 10.9* 10.5*  -Continue monitoring  Mobility, has not gotten out of the bed -OOB for meals and ambulate in the hall -PT/OT  Body mass index is 26.57 kg/m.           DVT prophylaxis:  On full dose anticoagulation with IV heparin  Code Status: Full code Family Communication: None at bedside Level of care: Telemetry Status is: Inpatient Remains inpatient appropriate because: Intractable nausea and abdominal pain   Final disposition: TBD Consultants:  Oncology Gastroenterology  Sch Meds:  Scheduled Meds:  amLODipine  10 mg Oral QHS   Chlorhexidine Gluconate Cloth  6 each Topical Daily   fentaNYL  1 patch Transdermal Q72H   insulin aspart  0-5 Units  Subcutaneous QHS   insulin aspart  0-9 Units Subcutaneous TID WC   ondansetron (ZOFRAN) IV  4 mg Intravenous Q6H   pantoprazole  40 mg Oral BID   sodium chloride flush  10-40 mL Intracatheter Q12H   Continuous Infusions:  0.9 % NaCl with KCl 20 mEq / L 100 mL/hr at 03/10/22 0509   heparin 1,000 Units/hr (03/09/22 2218)   PRN Meds:.HYDROmorphone (DILAUDID) injection, labetalol, lip balm, loperamide, morphine injection, mouth rinse, prochlorperazine, sodium chloride flush  Antimicrobials: Anti-infectives (From admission, onward)    None        I have personally reviewed the following labs and images: CBC: Recent Labs  Lab 03/04/22 1136 03/04/22 1449 03/06/22 0838 03/07/22 0408 03/08/22 0421 03/09/22 0354 03/10/22 0325  WBC 8.0 8.5 7.9 8.6 9.2 17.7* 13.5*  NEUTROABS 6.7 6.7  --   --   --   --   --   HGB 12.1 11.3* 11.2* 10.7* 10.6* 10.9* 10.5*  HCT 35.7* 33.5* 33.5* 32.1* 31.8* 32.6* 31.5*  MCV 84.9 83.1 85.9 86.5 86.4 86.5 86.1  PLT 280.0 258 261 258 239 232 220   BMP &GFR Recent Labs  Lab 03/06/22 0838 03/07/22 0408 03/08/22 0421 03/09/22 0354 03/10/22 0325  NA 126* 131* 132* 130* 130*  K 3.8 3.8 3.9 4.0 3.9  CL 93* 99 102 102 102  CO2 24  $'23 24 23 22  'd$ GLUCOSE 157* 138* 118* 151* 102*  BUN <5* <5* <5* 6* <5*  CREATININE 0.39* 0.47 0.47 0.54 0.53  CALCIUM 8.5* 8.7* 8.7* 8.0* 8.2*  MG 1.4* 1.9 1.6* 1.7 1.5*  PHOS 3.1 3.4 3.3 3.8 3.0   Estimated Creatinine Clearance: 65.7 mL/min (by C-G formula based on SCr of 0.53 mg/dL). Liver & Pancreas: Recent Labs  Lab 03/04/22 1136 03/04/22 1449 03/05/22 1518 03/06/22 0838 03/07/22 0408 03/08/22 0421 03/09/22 0354 03/10/22 0325  AST 26 24  --   --   --   --  15  --   ALT 20 22  --   --   --   --  14  --   ALKPHOS 72 64  --   --   --   --  54  --   BILITOT 0.7 0.9  --   --   --   --  0.9  --   PROT 7.9 7.5  --   --   --   --  6.2*  --   ALBUMIN 3.8 3.3*   < > 3.0* 2.8* 2.9* 2.5* 2.3*   < > = values in this  interval not displayed.   Recent Labs  Lab 03/04/22 1449  LIPASE 21   No results for input(s): "AMMONIA" in the last 168 hours. Diabetic: Recent Labs    03/08/22 0421  HGBA1C 5.7*   Recent Labs  Lab 03/09/22 1147 03/09/22 1631 03/09/22 2020 03/10/22 0742 03/10/22 1228  GLUCAP 102* 136* 74 133* 171*   Cardiac Enzymes: No results for input(s): "CKTOTAL", "CKMB", "CKMBINDEX", "TROPONINI" in the last 168 hours. No results for input(s): "PROBNP" in the last 8760 hours. Coagulation Profile: Recent Labs  Lab 03/04/22 1136  INR 1.1*   Thyroid Function Tests: No results for input(s): "TSH", "T4TOTAL", "FREET4", "T3FREE", "THYROIDAB" in the last 72 hours. Lipid Profile: No results for input(s): "CHOL", "HDL", "LDLCALC", "TRIG", "CHOLHDL", "LDLDIRECT" in the last 72 hours. Anemia Panel: Recent Labs    03/08/22 0421  VITAMINB12 846  FOLATE 8.3  FERRITIN 38  TIBC 247*  IRON 38  RETICCTPCT 1.9   Urine analysis:    Component Value Date/Time   COLORURINE YELLOW 02/15/2022 1059   APPEARANCEUR CLEAR 02/15/2022 1059   LABSPEC 1.013 02/15/2022 1059   PHURINE 7.0 02/15/2022 1059   GLUCOSEU NEGATIVE 02/15/2022 1059   HGBUR SMALL (A) 02/15/2022 1059   BILIRUBINUR NEGATIVE 02/15/2022 1059   BILIRUBINUR NEGATIVE 10/22/2018 1556   KETONESUR NEGATIVE 02/15/2022 1059   PROTEINUR NEGATIVE 02/15/2022 1059   UROBILINOGEN 0.2 10/22/2018 1556   NITRITE NEGATIVE 02/15/2022 1059   LEUKOCYTESUR SMALL (A) 02/15/2022 1059   Sepsis Labs: Invalid input(s): "PROCALCITONIN", "LACTICIDVEN"  Microbiology: No results found for this or any previous visit (from the past 240 hour(s)).  Radiology Studies: No results found.    Lavora Brisbon T. [place]  If 7PM-7AM, please contact night-coverage [email or web address] 03/10/2022, 1:24 PM

## 2022-03-10 NOTE — Progress Notes (Signed)
[place] for IV heparin Indication: splenic/SMV thrombosis  Allergies  Allergen Reactions   Lisinopril Swelling and Other (See Comments)    Facial and upper lip    Patient Measurements: Height: '5\' 3"'$  (160 cm) Weight: 68 kg (150 lb) IBW/kg (Calculated) : 52.4 Heparin Dosing Weight: 66  Vital Signs: Temp: 98.1 F (36.7 C) (07/23 1230) Temp Source: Oral (07/23 1230) BP: 115/71 (07/23 1230) Pulse Rate: 96 (07/23 1230)  Labs: Recent Labs    03/08/22 0421 03/09/22 0354 03/10/22 0325 03/10/22 1204  HGB 10.6* 10.9* 10.5*  --   HCT 31.8* 32.6* 31.5*  --   PLT 239 232 220  --   APTT  --   --  84* 84*  HEPARINUNFRC  --   --  >1.10*  --   CREATININE 0.47 0.54 0.53  --      Estimated Creatinine Clearance: 65.7 mL/min (by C-G formula based on SCr of 0.53 mg/dL).   Medications:  Medications Prior to Admission  Medication Sig Dispense Refill Last Dose   amLODipine (NORVASC) 10 MG tablet Take 1 tablet (10 mg total) by mouth at bedtime. 90 tablet 3 Past Week   apixaban (ELIQUIS) 5 MG TABS tablet Take 1 tablet (5 mg total) by mouth 2 (two) times daily. 60 tablet 0 over 2 weeks   atorvastatin (LIPITOR) 20 MG tablet Take 1 tablet (20 mg total) by mouth at bedtime. 90 tablet 3 03/03/2022   CREON 36000-114000 units CPEP capsule TAKE 2 CAPSULES BY MOUTH THREE TIMES DAILY BEFORE MEAL(S) (Patient taking differently: Take 72,000 Units by mouth 3 (three) times daily before meals.) 180 capsule 0 03/04/2022   glipiZIDE (GLUCOTROL XL) 5 MG 24 hr tablet Take 1 tablet (5 mg total) by mouth daily with breakfast. 90 tablet 1 03/04/2022   HYDROmorphone (DILAUDID) 4 MG tablet Take 1 tablet (4 mg total) by mouth every 4 (four) hours as needed for severe pain. 90 tablet 0 03/03/2022   olmesartan (BENICAR) 5 MG tablet Take 1 tablet by mouth once daily 30 tablet 0 03/04/2022   ALPRAZolam (XANAX) 1 MG tablet Take 0.5 tablets (0.5 mg total) by mouth every 8 (eight) hours as  needed for anxiety. 30 tablet 0    fentaNYL (DURAGESIC) 25 MCG/HR Place 1 patch onto the skin every 3 (three) days. (Patient not taking: Reported on 03/04/2022) 10 patch 0 Completed Course   metFORMIN (GLUCOPHAGE XR) 750 MG 24 hr tablet Take 1 tablet (750 mg total) by mouth daily with breakfast. (Patient not taking: Reported on 03/04/2022) 90 tablet 1 Not Taking   metoprolol succinate (TOPROL-XL) 25 MG 24 hr tablet TAKE 1 TABLET BY MOUTH ONCE DAILY (Patient not taking: Reported on 03/04/2022) 90 tablet 3 Completed Course   ondansetron (ZOFRAN) 8 MG tablet Take 1 tablet (8 mg total) by mouth 2 (two) times daily as needed (Nausea or vomiting). 30 tablet 1    pantoprazole (PROTONIX) 40 MG tablet Take 1 tablet (40 mg total) by mouth 2 (two) times daily for 14 days. (Patient not taking: Reported on 01/15/2022) 28 tablet 0    pantoprazole (PROTONIX) 40 MG tablet Take 1 tablet (40 mg total) by mouth daily. 30 tablet 5 unknown   potassium chloride (KLOR-CON M) 20 MEQ tablet Take 1 tablet (20 mEq total) by mouth 2 (two) times daily. (Patient not taking: Reported on 01/15/2022) 10 tablet 0 Completed Course   prochlorperazine (COMPAZINE) 10 MG tablet Take 1 tablet (10 mg total) by mouth every 6 (  six) hours as needed (Nausea or vomiting). (Patient not taking: Reported on 03/04/2022) 30 tablet 1 Completed Course   sucralfate (CARAFATE) 1 g tablet Take 1 tablet (1 g total) by mouth 2 (two) times daily. 40 tablet 0    Scheduled:   amLODipine  10 mg Oral QHS   Chlorhexidine Gluconate Cloth  6 each Topical Daily   fentaNYL  1 patch Transdermal Q72H   insulin aspart  0-5 Units Subcutaneous QHS   insulin aspart  0-9 Units Subcutaneous TID WC   ondansetron (ZOFRAN) IV  4 mg Intravenous Q6H   pantoprazole  40 mg Oral BID   sodium chloride flush  10-40 mL Intracatheter Q12H   PRN: HYDROmorphone (DILAUDID) injection, labetalol, lip balm, loperamide, morphine injection, mouth rinse, prochlorperazine, sodium chloride  flush  Assessment: 56 yoF with PMH cirrhosis, HepC, recurrent pancreatic cancer on chemo, portal vein thrombosis on Eliquis PTA, admitted for electrolyte derangements, and 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 May need esophageal/duodenal stenting and Eliquis held for potential GI procedures with Pharmacy to dose IV heparin in the meantime.  Baseline INR, aPTT: not done Prior anticoagulation: Eliquis 5 mg PO bid, LD 7/22 @ 0800  Significant events:  Today, 03/10/2022: Confirmatory aPTT remains stable and therapeutic on 1000 units/hr Heparin level falsely elevated as expected due to recent apixaban use CBC: Hgb low but stable; Plt stable WNL No bleeding or infusion issues per nursing SCr stable WNL  Goal of Therapy: Heparin level 0.3-0.7 units/ml Monitor platelets by anticoagulation protocol: Yes  Plan: Continue Heparin 1000 units/hr IV infusion  Daily CBC, daily heparin; aPTTs as needed until DOAC effects have dissipated Monitor for signs of bleeding or worsening thrombosis   Reuel Boom, PharmD, BCPS [phone] 03/10/2022, 2:36 PM

## 2022-03-10 NOTE — Progress Notes (Signed)
[place] for IV heparin Indication: splenic/SMV thrombosis  Allergies  Allergen Reactions   Lisinopril Swelling and Other (See Comments)    Facial and upper lip    Patient Measurements: Height: '5\' 3"'$  (160 cm) Weight: 68 kg (150 lb) IBW/kg (Calculated) : 52.4 Heparin Dosing Weight: 66  Vital Signs: Temp: 100 F (37.8 C) (07/22 2020) Temp Source: Oral (07/22 2020) BP: 150/95 (07/22 2020) Pulse Rate: 107 (07/22 2020)  Labs: Recent Labs    03/07/22 0408 03/08/22 0421 03/09/22 0354 03/10/22 0325  HGB 10.7* 10.6* 10.9* 10.5*  HCT 32.1* 31.8* 32.6* 31.5*  PLT 258 239 232 220  APTT  --   --   --  84*  HEPARINUNFRC  --   --   --  >1.10*  CREATININE 0.47 0.47 0.54  --      Estimated Creatinine Clearance: 65.7 mL/min (by C-G formula based on SCr of 0.54 mg/dL).   Medical History: Past Medical History:  Diagnosis Date   Cancer ([place])    Cirrhosis ([place])    Diabetes mellitus 2012   Diverticulosis    Gallbladder sludge    Hepatitis 2010   history of Hepatitis C   Hepatitis C    Hypertension 2011   Obesity     Medications:  Medications Prior to Admission  Medication Sig Dispense Refill Last Dose   amLODipine (NORVASC) 10 MG tablet Take 1 tablet (10 mg total) by mouth at bedtime. 90 tablet 3 Past Week   apixaban (ELIQUIS) 5 MG TABS tablet Take 1 tablet (5 mg total) by mouth 2 (two) times daily. 60 tablet 0 over 2 weeks   atorvastatin (LIPITOR) 20 MG tablet Take 1 tablet (20 mg total) by mouth at bedtime. 90 tablet 3 03/03/2022   CREON 36000-114000 units CPEP capsule TAKE 2 CAPSULES BY MOUTH THREE TIMES DAILY BEFORE MEAL(S) (Patient taking differently: Take 72,000 Units by mouth 3 (three) times daily before meals.) 180 capsule 0 03/04/2022   glipiZIDE (GLUCOTROL XL) 5 MG 24 hr tablet Take 1 tablet (5 mg total) by mouth daily with breakfast. 90 tablet 1 03/04/2022   HYDROmorphone (DILAUDID) 4 MG tablet Take 1 tablet (4 mg total) by  mouth every 4 (four) hours as needed for severe pain. 90 tablet 0 03/03/2022   olmesartan (BENICAR) 5 MG tablet Take 1 tablet by mouth once daily 30 tablet 0 03/04/2022   ALPRAZolam (XANAX) 1 MG tablet Take 0.5 tablets (0.5 mg total) by mouth every 8 (eight) hours as needed for anxiety. 30 tablet 0    fentaNYL (DURAGESIC) 25 MCG/HR Place 1 patch onto the skin every 3 (three) days. (Patient not taking: Reported on 03/04/2022) 10 patch 0 Completed Course   metFORMIN (GLUCOPHAGE XR) 750 MG 24 hr tablet Take 1 tablet (750 mg total) by mouth daily with breakfast. (Patient not taking: Reported on 03/04/2022) 90 tablet 1 Not Taking   metoprolol succinate (TOPROL-XL) 25 MG 24 hr tablet TAKE 1 TABLET BY MOUTH ONCE DAILY (Patient not taking: Reported on 03/04/2022) 90 tablet 3 Completed Course   ondansetron (ZOFRAN) 8 MG tablet Take 1 tablet (8 mg total) by mouth 2 (two) times daily as needed (Nausea or vomiting). 30 tablet 1    pantoprazole (PROTONIX) 40 MG tablet Take 1 tablet (40 mg total) by mouth 2 (two) times daily for 14 days. (Patient not taking: Reported on 01/15/2022) 28 tablet 0    pantoprazole (PROTONIX) 40 MG tablet Take 1 tablet (40 mg total) by mouth  daily. 30 tablet 5 unknown   potassium chloride (KLOR-CON M) 20 MEQ tablet Take 1 tablet (20 mEq total) by mouth 2 (two) times daily. (Patient not taking: Reported on 01/15/2022) 10 tablet 0 Completed Course   prochlorperazine (COMPAZINE) 10 MG tablet Take 1 tablet (10 mg total) by mouth every 6 (six) hours as needed (Nausea or vomiting). (Patient not taking: Reported on 03/04/2022) 30 tablet 1 Completed Course   sucralfate (CARAFATE) 1 g tablet Take 1 tablet (1 g total) by mouth 2 (two) times daily. 40 tablet 0    Scheduled:   amLODipine  10 mg Oral QHS   Chlorhexidine Gluconate Cloth  6 each Topical Daily   fentaNYL  1 patch Transdermal Q72H   insulin aspart  0-5 Units Subcutaneous QHS   insulin aspart  0-9 Units Subcutaneous TID WC   ondansetron  (ZOFRAN) IV  4 mg Intravenous Q6H   pantoprazole  40 mg Oral BID   sodium chloride flush  10-40 mL Intracatheter Q12H   PRN: HYDROmorphone (DILAUDID) injection, labetalol, lip balm, loperamide, morphine injection, mouth rinse, prochlorperazine, sodium chloride flush  Assessment: 57 yoF with PMH cirrhosis, HepC, recurrent pancreatic cancer on chemo, portal vein thrombosis on Eliquis PTA, admitted for electrolyte derangements, and 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 May need esophageal/duodenal stenting and Eliquis held for potential GI procedures with Pharmacy to dose IV heparin in the meantime.  Baseline INR, aPTT: not done Prior anticoagulation: Eliquis 5 mg PO bid, LD 7/22 @ 0800  Significant events:  Today, 03/10/2022: APTT: 84 sec- therapeutic on IV heparin 1000 units/hr Heparin level >1.1- falsely elevated as expected due to recent apixaban use CBC: Hgb low but stable; Plt stable WNL No bleeding or infusion issues per nursing SCr stable WNL  Goal of Therapy: Heparin level 0.3-0.7 units/ml Monitor platelets by anticoagulation protocol: Yes  Plan: Continue Heparin 1000 units/hr IV infusion  Check confirmatory aPTT at noon Daily CBC, daily heparin level while on heparin Monitor for signs of bleeding or worsening thrombosis  Netta Cedars, PharmD, BCPS 03/10/2022, 3:57 AM

## 2022-03-10 NOTE — Progress Notes (Signed)
    Progress Note   Assessment    Metastatic pancreatic cancer with nausea, vomiting and epigastric pain Duodenal obstruction, extrinsic compression from pancreatic cancer Biliary obstruction with SEMS biliary stent in place Splenic vein and SMV thrombosis  Hyponatremia   Recommendations   Continue clear liquids. Pt is reluctant to advance to full liquids.  Consider palliative duodenal stent, possibly on Tuesday. Our [facility] inpatient team will review with the appropriate endoscopists on Monday.  Continue to hold Eliquis on IV heparin in anticipation of EGD duodenal stent placement   Chief Complaint   Persistent nausea however no vomiting since Friday night. Upper abdominal pain improved however it persists  Vital signs in last 24 hours: Temp:  [98.4 F (36.9 C)-100 F (37.8 C)] 99.2 F (37.3 C) (07/23 0544) Pulse Rate:  [97-107] 99 (07/23 0544) Resp:  [18] 18 (07/23 0544) BP: (124-150)/(72-95) 129/83 (07/23 0544) SpO2:  [95 %-99 %] 95 % (07/23 0544) Last BM Date : 03/09/22  General: Alert, well-developed, in NAD Heart:  Regular rate and rhythm; no murmurs Chest: Clear to ascultation bilaterally Abdomen:  Soft, upper abdominal tenderness and nondistended. Normal bowel sounds, without guarding, and without rebound.   Extremities:  Without edema. Neurologic:  Alert and  oriented x4; grossly normal neurologically. Psych:  Alert and cooperative. Normal mood and affect.  Intake/Output from previous day: 07/22 0701 - 07/23 0700 In: 3838.6 [P.O.:720; I.V.:3118.6] Out: -  Intake/Output this shift: No intake/output data recorded.  Lab Results: Recent Labs    03/08/22 0421 03/09/22 0354 03/10/22 0325  WBC 9.2 17.7* 13.5*  HGB 10.6* 10.9* 10.5*  HCT 31.8* 32.6* 31.5*  PLT 239 232 220   BMET Recent Labs    03/08/22 0421 03/09/22 0354 03/10/22 0325  NA 132* 130* 130*  K 3.9 4.0 3.9  CL 102 102 102  CO2 '24 23 22  '$ GLUCOSE 118* 151* 102*  BUN <5* 6* <5*   CREATININE 0.47 0.54 0.53  CALCIUM 8.7* 8.0* 8.2*   LFT Recent Labs    03/09/22 0354 03/10/22 0325  PROT 6.2*  --   ALBUMIN 2.5* 2.3*  AST 15  --   ALT 14  --   ALKPHOS 54  --   BILITOT 0.9  --      LOS: 5 days   Stacye Noori T. Fuller Plan, MD 03/10/2022, 10:23 AM See [facility], to contact our on call provider

## 2022-03-11 ENCOUNTER — Encounter (HOSPITAL_COMMUNITY): Payer: Self-pay | Admitting: Gastroenterology

## 2022-03-11 ENCOUNTER — Other Ambulatory Visit: Payer: Self-pay

## 2022-03-11 DIAGNOSIS — E871 Hypo-osmolality and hyponatremia: Secondary | ICD-10-CM | POA: Diagnosis not present

## 2022-03-11 DIAGNOSIS — I82890 Acute embolism and thrombosis of other specified veins: Secondary | ICD-10-CM | POA: Diagnosis not present

## 2022-03-11 DIAGNOSIS — E876 Hypokalemia: Secondary | ICD-10-CM | POA: Diagnosis not present

## 2022-03-11 DIAGNOSIS — R1013 Epigastric pain: Secondary | ICD-10-CM | POA: Diagnosis not present

## 2022-03-11 DIAGNOSIS — C250 Malignant neoplasm of head of pancreas: Secondary | ICD-10-CM | POA: Diagnosis not present

## 2022-03-11 DIAGNOSIS — K7460 Unspecified cirrhosis of liver: Secondary | ICD-10-CM | POA: Diagnosis not present

## 2022-03-11 DIAGNOSIS — K315 Obstruction of duodenum: Secondary | ICD-10-CM | POA: Diagnosis not present

## 2022-03-11 LAB — CBC
HCT: 32 % — ABNORMAL LOW (ref 36.0–46.0)
Hemoglobin: 10.6 g/dL — ABNORMAL LOW (ref 12.0–15.0)
MCH: 28.6 pg (ref 26.0–34.0)
MCHC: 33.1 g/dL (ref 30.0–36.0)
MCV: 86.3 fL (ref 80.0–100.0)
Platelets: 250 10*3/uL (ref 150–400)
RBC: 3.71 MIL/uL — ABNORMAL LOW (ref 3.87–5.11)
RDW: 15.9 % — ABNORMAL HIGH (ref 11.5–15.5)
WBC: 13.4 10*3/uL — ABNORMAL HIGH (ref 4.0–10.5)
nRBC: 0 % (ref 0.0–0.2)

## 2022-03-11 LAB — HEPARIN LEVEL (UNFRACTIONATED): Heparin Unfractionated: 0.34 IU/mL (ref 0.30–0.70)

## 2022-03-11 LAB — COMPREHENSIVE METABOLIC PANEL
ALT: 14 U/L (ref 0–44)
AST: 15 U/L (ref 15–41)
Albumin: 2.5 g/dL — ABNORMAL LOW (ref 3.5–5.0)
Alkaline Phosphatase: 74 U/L (ref 38–126)
Anion gap: 8 (ref 5–15)
BUN: <5 mg/dL — ABNORMAL LOW (ref 8–23)
CO2: 22 mmol/L (ref 22–32)
Calcium: 8.1 mg/dL — ABNORMAL LOW (ref 8.9–10.3)
Chloride: 98 mmol/L (ref 98–111)
Creatinine, Ser: 0.48 mg/dL (ref 0.44–1.00)
GFR, Estimated: >60 mL/min (ref >60)
Glucose, Bld: 160 mg/dL — ABNORMAL HIGH (ref 70–99)
Potassium: 3.9 mmol/L (ref 3.5–5.1)
Sodium: 128 mmol/L — ABNORMAL LOW (ref 135–145)
Total Bilirubin: 0.9 mg/dL (ref 0.3–1.2)
Total Protein: 6.4 g/dL — ABNORMAL LOW (ref 6.5–8.1)

## 2022-03-11 LAB — PHOSPHORUS: Phosphorus: 2.7 mg/dL (ref 2.5–4.6)

## 2022-03-11 LAB — GLUCOSE, CAPILLARY
Glucose-Capillary: 76 mg/dL (ref 70–99)
Glucose-Capillary: 173 mg/dL — ABNORMAL HIGH (ref 70–99)
Glucose-Capillary: 111 mg/dL — ABNORMAL HIGH (ref 70–99)
Glucose-Capillary: 140 mg/dL — ABNORMAL HIGH (ref 70–99)
Glucose-Capillary: 83 mg/dL (ref 70–99)

## 2022-03-11 LAB — LIPASE, BLOOD: Lipase: 21 U/L (ref 11–51)

## 2022-03-11 LAB — APTT: aPTT: 84 seconds — ABNORMAL HIGH (ref 24–36)

## 2022-03-11 LAB — MAGNESIUM: Magnesium: 1.5 mg/dL — ABNORMAL LOW (ref 1.7–2.4)

## 2022-03-11 MED ORDER — FENTANYL 50 MCG/HR TD PT72
1.0000 | MEDICATED_PATCH | TRANSDERMAL | Status: DC
  Administered 2022-03-11: 1 via TRANSDERMAL
  Filled 2022-03-11: qty 1

## 2022-03-11 MED ORDER — HYDROMORPHONE HCL 1 MG/ML IJ SOLN
0.5000 mg | INTRAMUSCULAR | Status: DC | PRN
  Administered 2022-03-11 – 2022-03-14 (×20): 0.5 mg via INTRAVENOUS
  Filled 2022-03-11 (×20): qty 0.5

## 2022-03-11 MED ORDER — SODIUM CHLORIDE 0.9 % IV SOLN: INTRAVENOUS | Status: DC

## 2022-03-11 MED ORDER — MAGNESIUM SULFATE 4 GM/100ML IV SOLN
4.0000 g | Freq: Once | INTRAVENOUS | Status: AC
  Administered 2022-03-11: 4 g via INTRAVENOUS
  Filled 2022-03-11: qty 100

## 2022-03-11 MED ORDER — FENTANYL 25 MCG/HR TD PT72
1.0000 | MEDICATED_PATCH | TRANSDERMAL | Status: DC
  Administered 2022-03-11 – 2022-03-14 (×2): 1 via TRANSDERMAL
  Filled 2022-03-11 (×2): qty 1

## 2022-03-11 NOTE — Progress Notes (Signed)
[place] for IV heparin Indication: splenic/SMV thrombosis  Allergies  Allergen Reactions   Lisinopril Swelling and Other (See Comments)    Facial and upper lip    Patient Measurements: Height: '5\' 3"'$  (160 cm) Weight: 68 kg (150 lb) IBW/kg (Calculated) : 52.4 Heparin Dosing Weight: 66 kg  Vital Signs: Temp: 98.6 F (37 C) (07/24 0300) Temp Source: Oral (07/24 0300) BP: 130/71 (07/24 0300) Pulse Rate: 100 (07/24 0300)  Labs: Recent Labs    03/09/22 0354 03/10/22 0325 03/10/22 1204 03/11/22 0327  HGB 10.9* 10.5*  --  10.6*  HCT 32.6* 31.5*  --  32.0*  PLT 232 220  --  250  APTT  --  84* 84* 84*  HEPARINUNFRC  --  >1.10*  --  0.34  CREATININE 0.54 0.53  --  0.48     Estimated Creatinine Clearance: 65.7 mL/min (by C-G formula based on SCr of 0.48 mg/dL).   Assessment: 76 yoF with PMH cirrhosis, HepC, recurrent pancreatic cancer on chemo, portal vein and mesenteric thrombosis (off Apixaban x 2 weeks since she was not aware to continue after start pack) admitted for electrolyte derangements. May need esophageal/duodenal stenting and Apixaban held for potential GI procedures. Pharmacy consulted to dose IV heparin in the meantime.  Baseline INR, aPTT: not done Prior anticoagulation: Apixaban 5 mg PO BID, LD 7/22 @ 0813  Today, 03/11/2022: aPTT = 84 seconds, heparin level = 0.34 units/mL- both therapeutic on heparin infusion at 1000 units/hr CBC: Hgb 10.6, low but stable; Pltc stable/WNL No bleeding or infusion issues per nursing SCr stable/WNL  Goal of Therapy: Heparin level 0.3-0.7 units/ml Monitor platelets by anticoagulation protocol: Yes  Plan: Continue heparin infusion at 1000 units/hr   No need for further aPTT monitoring  Daily CBC, heparin level  Monitor closely for s/sx of bleeding or worsening thrombosis Noted plans for possible palliative duodenal stent on Tuesday. GI: please indicate when heparin needs to be held  prior to procedure.    Lindell Spar, PharmD, BCPS [phone] 03/11/2022, 7:34 AM

## 2022-03-11 NOTE — Progress Notes (Signed)
It sounds like she will have a duodenal stent placed on Tuesday.  She still is only taking in some liquids.  She is having no diarrhea.  She is having some nausea with occasional vomiting.  She is still having abdominal pain.  I will have to try to increase the Duragesic patch.  She has had no bleeding.  Her labs today show white count 13.4.  Hemoglobin 10.6.  Platelet count 250,000.  Her BUN is less than 5 creatinine 0.48.  Calcium 8.1 with an albumin of 2.5.  Hopefully, a duodenal stent will be able to be placed so that she can eat some more solid food and get more caloric intake.  Ultimately, we will have to try to treat this malignancy.  We have not been able to treat her because of other issues, particularly hepatic abscess.  Once we get her home, we can get her back to the office for treatment.  She has had no cough.  There is no shortness of breath.  There is no obvious fever.  Her vital signs show temperature 98.6.  Pulse 100.  Blood pressure 130/71.  Her abdomen is soft.  Bowel sounds are decreased.  There is a little bit of tenderness in the epigastric area.  There is no fluid wave.  There is no palpable abdominal mass.  There is no liver or spleen tip.  Lungs are clear.  Cardiac exam regular rate and rhythm.  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  Again I know Gastroenterology will do a fantastic job in trying to alleviate this so that she will have a better quality of life.  She knows that she is not going to be cured of her cancer.  She just wants to be able to enjoy herself and enjoy her life and enjoy her family.  She would like to be able to eat more.  I cannot blame her 1 bit.    Lattie Haw, MD  Oswaldo Milian 30:21

## 2022-03-11 NOTE — Evaluation (Signed)
Physical Therapy Evaluation-1x Patient Details Name: Melanie Alvarez MRN: [id number] DOB: 1956-07-30 Today's Date: 03/11/2022  History of Present Illness  Patient is a 66 year old female who presented to the hosptial from GI with nausea, abdominal pain and vomitting. patient was admitted with intractable nausea/vomitting, hyponatremia, hypokalemia, hypomagnesemia.     PMH: liver cirrhosis, Hep C, recurrent pancreatic cancer, hepatic abscess, Dm II, HTN, HLD, portal and mesentric vein thrombosis.  Clinical Impression  On eval, pt was Mod Ind with mobility. She reported she had already walked in the hallway with OT and that she has been walking the halls unassisted. Briefly assessed pt's balance-no difficulties observed. 1x eval. Will sign off. Pt reports she is tentatively scheduled for a procedure. Please reorder if needed. Will sign off at this time. Thanks.        Recommendations for follow up therapy are one component of a multi-disciplinary discharge planning process, led by the attending physician.  Recommendations may be updated based on patient status, additional functional criteria and insurance authorization.  Follow Up Recommendations No PT follow up      Assistance Recommended at Discharge None  Patient can return home with the following       Equipment Recommendations None recommended by PT  Recommendations for Other Services       Functional Status Assessment       Precautions / Restrictions Precautions Precautions: Fall Restrictions Weight Bearing Restrictions: No      Mobility  Bed Mobility Overal bed mobility: Modified Independent                  Transfers Overall transfer level: Modified independent                      Ambulation/Gait Ambulation/Gait assistance: Modified independent (Device/Increase time)   Assistive device: None Gait Pattern/deviations: Step-through pattern       General Gait Details: Pt took a few steps around  the room (reports she had already walked the hallways with OT).  Stairs            Wheelchair Mobility    Modified Rankin (Stroke Patients Only)       Balance Overall balance assessment:  (briefly assessed balance: static standing EO/EC, narrow BOS, withstanding of perturbations-no difficulty or LOB)                                           Pertinent Vitals/Pain Pain Assessment Pain Assessment: No/denies pain    Home Living Family/patient expects to be discharged to:: Private residence Living Arrangements: Spouse/significant other Available Help at Discharge: Family;Available 24 hours/day   Home Access: Stairs to enter Entrance Stairs-Rails: Can reach both;Left;Right Entrance Stairs-Number of Steps: 4   Home Layout: One level Home Equipment: Rollator (4 wheels) Additional Comments: sister to assist at home    Prior Function Prior Level of Function : Independent/Modified Independent             Mobility Comments: walked without AD ADLs Comments: independent     Hand Dominance        Extremity/Trunk Assessment   Upper Extremity Assessment Upper Extremity Assessment: Defer to OT evaluation    Lower Extremity Assessment Lower Extremity Assessment: Overall WFL for tasks assessed    Cervical / Trunk Assessment Cervical / Trunk Assessment: Normal  Communication   Communication: No difficulties  Cognition Arousal/Alertness:  Awake/alert Behavior During Therapy: WFL for tasks assessed/performed Overall Cognitive Status: Within Functional Limits for tasks assessed                                          General Comments      Exercises     Assessment/Plan    PT Assessment Patient does not need any further PT services  PT Problem List         PT Treatment Interventions      PT Goals (Current goals can be found in the Care Plan section)  Acute Rehab PT Goals Patient Stated Goal: to have procedure soon and  get home PT Goal Formulation: All assessment and education complete, DC therapy    Frequency       Co-evaluation               AM-PAC PT "6 Clicks" Mobility  Outcome Measure Help needed turning from your back to your side while in a flat bed without using bedrails?: None Help needed moving from lying on your back to sitting on the side of a flat bed without using bedrails?: None Help needed moving to and from a bed to a chair (including a wheelchair)?: None Help needed standing up from a chair using your arms (e.g., wheelchair or bedside chair)?: None Help needed to walk in hospital room?: None Help needed climbing 3-5 steps with a railing? : None 6 Click Score: 24    End of Session   Activity Tolerance: Patient tolerated treatment well Patient left: in bed;with call bell/phone within reach        Time: 1125-1133 PT Time Calculation (min) (ACUTE ONLY): 8 min   Charges:   PT Evaluation $PT Eval Low Complexity: [place], PT Acute Rehabilitation  Office: [phone] Pager: [phone]

## 2022-03-11 NOTE — Progress Notes (Addendum)
Daily Progress Note  Hospital Day: 8  Chief Complaint:   Brief History Melanie Alvarez is a 66 y.o. female with a pmh not limited to metastatic pancreatic cancer, SMA and SV occlusion admitted with nausea, vomiting and abdominal pain. See our 7/19 consult note for details    Assessment / Plan   # Stage IV pancreatic cancer. Initially has stage II / III disease when diagnosed in 2021. She is s/p placement of SEMS for biliary obstruction.  Had recurrence / progression in May 2023, now has stage IV disease and periportal adenopathy and SMV /splenic vein thrombosis  # Duodenal obstruction related to extrinsic compression form pancreatic cancer. Admitted with abdominal pain, nausea / vomiting.  No nausea today, tolerating clear liquids. Diet not yet advanced to full liquids ( patient reluctant) Will discuss palliative duodenal stent with Dr. Rush Landmark. Eliquis is on hold. Will need to hold IV heparin when / if decide to proceed with stent.   # Hyponatremia / hypomagnesemia.  Na +128. Mg+ 1.5. K+ normal at 3.9  # History of hepatic abscess June 2023. 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 She has mid abdominal discomfort. Stools are still formed but a little more loose than her normal.   Objective   Endoscopic studies:   03/08/22 EGD - The examined esophagus was normal. Bilious fluid 600cc was found and suctioned in the gastric fundus and in the gastric body. An endoclip previously placed was found in the cardia.The exam of the stomach was otherwise normal. SEMS bilary stent in the second portion of the duodenum. An acquired extrinsic moderate stenosis was found in the second portion of the duodenum and was traversed. A few localized erosions without bleeding were found in the second portion of the duodenum near the SEMS.   Lab Results: Recent Labs    03/09/22 0354 03/10/22 0325 03/11/22 0327   WBC 17.7* 13.5* 13.4*  HGB 10.9* 10.5* 10.6*  HCT 32.6* 31.5* 32.0*  PLT 232 220 250   BMET Recent Labs    03/09/22 0354 03/10/22 0325 03/11/22 0327  NA 130* 130* 128*  K 4.0 3.9 3.9  CL 102 102 98  CO2 '23 22 22  '$ GLUCOSE 151* 102* 160*  BUN 6* <5* <5*  CREATININE 0.54 0.53 0.48  CALCIUM 8.0* 8.2* 8.1*   LFT Recent Labs    03/11/22 0327  PROT 6.4*  ALBUMIN 2.5*  AST 15  ALT 14  ALKPHOS 74  BILITOT 0.9   PT/INR No results for input(s): "LABPROT", "INR" in the last 72 hours.   Scheduled inpatient medications:   amLODipine  10 mg Oral QHS   Chlorhexidine Gluconate Cloth  6 each Topical Daily   fentaNYL  1 patch Transdermal Q72H   insulin aspart  0-5 Units Subcutaneous QHS   insulin aspart  0-9 Units Subcutaneous TID WC   ondansetron (ZOFRAN) IV  4 mg Intravenous Q6H   pantoprazole  40 mg Oral BID   sodium chloride flush  10-40 mL Intracatheter Q12H   Continuous inpatient infusions:   0.9 % NaCl with KCl 20 mEq / L 100 mL/hr at 03/11/22 0131   heparin 1,000 Units/hr (03/10/22 2149)   magnesium sulfate bolus IVPB     PRN inpatient medications: HYDROmorphone (DILAUDID) injection, labetalol, lip balm, loperamide, morphine injection, mouth rinse, prochlorperazine, sodium chloride flush  Vital signs in last 24 hours: Temp:  [98.1 F (36.7  C)-99.2 F (37.3 C)] 98.6 F (37 C) (07/24 0300) Pulse Rate:  [96-100] 100 (07/24 0300) Resp:  [18-20] 20 (07/24 0300) BP: (115-147)/(71-81) 130/71 (07/24 0300) SpO2:  [97 %-100 %] 99 % (07/24 0300) Last BM Date : 03/09/22  Intake/Output Summary (Last 24 hours) at 03/11/2022 0816 Last data filed at 03/11/2022 0400 Gross per 24 hour  Intake 2334.67 ml  Output --  Net 2334.67 ml     Physical Exam:  General: Alert female in NAD Heart:  Regular rate and rhythm. No lower extremity edema Pulmonary: Normal respiratory effort Abdomen: Soft, nondistended, nontender. Normal bowel sounds.  Neurologic: Alert and  oriented Psych: Pleasant. Cooperative.    Intake/Output from previous day: 07/23 0701 - 07/24 0700 In: 2574.7 [P.O.:360; I.V.:2214.7] Out: -  Intake/Output this shift: No intake/output data recorded.    Principal Problem:   Hyponatremia Active Problems:   Hypertension   Hypokalemia   Cirrhosis of liver (HCC)   Malignant neoplasm of pancreas [facility])   Pancreatic insufficiency   Mesenteric vein thrombosis (HCC)   Acute thrombosis of splenic vein   Intractable nausea and vomiting   Abdominal pain, epigastric   Hypomagnesemia   Duodenal obstruction, acquired   Biliary obstruction     LOS: 6 days   Tye Savoy ,NP 03/11/2022, 8:16 AM

## 2022-03-11 NOTE — Progress Notes (Signed)
Per Tye Savoy, NP, stop heparin infusion 03/12/22 at 0600 for EGD/duodenal stent placement 03/12/22 between 1000-1200. Stop time placed on orders and RN notified.  Lindell Spar, PharmD, BCPS Clinical Pharmacist 03/11/2022 3:22 PM

## 2022-03-11 NOTE — Progress Notes (Signed)
PROGRESS NOTE  Melanie Alvarez JGO:[id number] DOB: [birth year]   PCP: Flossie Buffy, NP  Patient is from: Home.  Lives with husband.  Independently ambulates at baseline.  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  Lab work at the GI office concerning for hyponatremia and hypokalemia, and patient was directed to ED.  Of note, patient was off Eliquis for 2 weeks.  She did not know she was supposed to continue after starter pack.  In ED, Na 122.  K2.8.  CT abdomen and pelvis showed no acute finding but stable mild intra and extrahepatic biliary ductal dilation with palliative metabolic biliary stent in place, stable pancreatic mass with occlusion of the mesenteric and splenic vein.    Electrolytes improved.  Still with intractable nausea, vomiting and abdominal pain.  Oncology and GI consulted.  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  GI considering duodenal stent.  Subjective: Seen and examined earlier this.  No major events overnight of this morning.  Continues to endorse significant abdominal pain.  She reports loose stool last night.  No nausea or vomiting.  Objective: Vitals:   03/10/22 1230 03/10/22 1953 03/11/22 0300 03/11/22 1328  BP: 115/71 (!) 147/81 130/71 136/85  Pulse: 96 98 100 (!) 105  Resp: '18 18 20 16  '$ Temp: 98.1 F (36.7 C) 99.2 F (37.3 C) 98.6 F (37 C) 97.8 F (36.6 C)  TempSrc: Oral Oral Oral Oral  SpO2: 100%  97% 99% 98%  Weight:      Height:        Examination:  GENERAL: No apparent distress.  Nontoxic. HEENT: MMM.  Vision and hearing grossly intact.  NECK: Supple.  No apparent JVD.  RESP:  No IWOB.  Fair aeration bilaterally. CVS:  RRR. Heart sounds normal.  ABD/GI/GU: BS+. Abd soft.  Mild LUQ and LMQ tenderness. MSK/EXT:  Moves extremities. No apparent deformity. No edema.  SKIN: no apparent skin lesion or wound NEURO: Awake and alert. Oriented appropriately.  No apparent focal neuro deficit. PSYCH: Calm. Normal affect.   Procedures:  EGD on 7/21 showed normal esophagus, large volume bilious gastric fluid, previously placed Endo Clip in gastric cardia, acquired extrinsic duodenal stenosis and duodenal erosion without bleeding.  Microbiology summarized: None  Assessment and plan: Principal Problem:   Hyponatremia Active Problems:   Hypertension   Hypokalemia   Cirrhosis of liver (HCC)   Malignant neoplasm of pancreas (HCC)   Pancreatic insufficiency   Mesenteric vein thrombosis (HCC)   Acute thrombosis of splenic vein   Intractable nausea and vomiting   Abdominal pain, epigastric   Hypomagnesemia   Duodenal obstruction, acquired   Biliary obstruction  Intractable nausea/vomiting/abdominal pain: EGD with duodenitis and partial duodenal obstruction due to pancreatic mass.  CT abdomen without acute finding but chronic intra and extra biliary ductal dilation with pancreatic mass.  Continues to endorse nausea and abdominal pain even with CLD -Continue IV fluid, antiemetics, PPI, Carafate and analgesics. -Increased fentanyl patch to 50 mcg every 72 hours -GI following-recommends continuing  clear liquid diet and holding Eliquis -Changed Eliquis to IV heparin in case of any procedure  Hyponatremia: Na 122>> 132>> 130.  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 -Continue IV fluid   Hypokalemia/hypomagnesemia: -Continue NS with KCl -Monitor and replace as  appropriate.   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  Patient thought she did not need Eliquis and stopped taking after starter pack. -Changed Eliquis to IV heparin in case of further GI procedure   Malignant neoplasm of pancreas: Followed by Dr. Marin Olp.  On systemic chemo. 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 -Continue monitoring  Controlled NIDDM-2 with hyperglycemia: A1c 5.7%.  On glipizide and metformin at home. Recent Labs  Lab 03/10/22 1228 03/10/22 1625 03/10/22 2118 03/11/22 0745 03/11/22 1114  GLUCAP 171* 134* 146* 173* 111*  -Continue SSI-sensitive  History of hepatic abscess-treated and resolved.  Status post drain removal on 6/29.  Essential hypertension: Normotensive for most part. -Continue amlodipine -Discontinued Avapro with the hope to help hyponatremia  Normocytic anemia: No report of melena or hematochezia.  Anemia panel suggests ACD Recent Labs    01/25/22 1240 02/15/22 0848 03/04/22 1136 03/04/22 1449 03/06/22 0838 03/07/22 0408 03/08/22 0421 03/09/22 0354 03/10/22 0325 03/11/22 0327  HGB 11.0* 12.1 12.1 11.3* 11.2* 10.7* 10.6* 10.9* 10.5* 10.6*  -Continue monitoring  Mobility, has not gotten out of the bed -OOB for meals and ambulate in the hall -PT/OT  Body mass index is 26.57 kg/m.           DVT prophylaxis:  On full dose anticoagulation with IV heparin  Code Status: Full code Family Communication: None at bedside Level of care: Med-Surg Status is: Inpatient Remains inpatient appropriate because: Intractable nausea and abdominal pain   Final disposition: Home once medically cleared Consultants:  Oncology Gastroenterology  Sch Meds:  Scheduled Meds:  amLODipine  10 mg Oral QHS   Chlorhexidine Gluconate Cloth  6 each Topical Daily   fentaNYL  1 patch Transdermal Q72H   insulin aspart  0-5 Units Subcutaneous  QHS   insulin aspart  0-9 Units Subcutaneous TID WC   ondansetron (ZOFRAN) IV  4 mg Intravenous Q6H   pantoprazole  40 mg Oral BID   sodium chloride flush  10-40 mL Intracatheter Q12H   Continuous Infusions:  0.9 % NaCl with KCl 20 mEq / L 100 mL/hr at 03/11/22 0131   heparin 1,000 Units/hr (03/10/22 2149)   PRN Meds:.HYDROmorphone (DILAUDID) injection, labetalol, lip balm, loperamide, morphine injection, mouth rinse, prochlorperazine, sodium chloride flush  Antimicrobials: Anti-infectives (From admission, onward)    None        I have personally reviewed the following labs and images: CBC: Recent Labs  Lab 03/04/22 1449 03/06/22 0838 03/07/22 0408 03/08/22 0421 03/09/22 0354 03/10/22 0325 03/11/22 0327  WBC 8.5   < > 8.6 9.2 17.7* 13.5* 13.4*  NEUTROABS 6.7  --   --   --   --   --   --   HGB 11.3*   < > 10.7* 10.6* 10.9* 10.5* 10.6*  HCT 33.5*   < > 32.1* 31.8* 32.6* 31.5* 32.0*  MCV 83.1   < > 86.5 86.4 86.5 86.1 86.3  PLT 258   < > 258 239 232 220 250   < > = values in this interval not displayed.   BMP &GFR Recent Labs  Lab 03/07/22 0408 03/08/22 0421 03/09/22 0354 03/10/22 4967 03/11/22 0327  NA 131* 132* 130* 130* 128*  K 3.8 3.9 4.0 3.9 3.9  CL 99 102 102 102 98  CO2 '23 24 23 22 22  '$ GLUCOSE 138* 118* 151* 102* 160*  BUN <5* <5* 6* <5* <5*  CREATININE 0.47 0.47 0.54 0.53 0.48  CALCIUM 8.7* 8.7* 8.0* 8.2* 8.1*  MG 1.9 1.6* 1.7 1.5* 1.5*  PHOS 3.4 3.3 3.8 3.0 2.7   Estimated Creatinine Clearance: 65.7 mL/min (by C-G formula based on SCr of 0.48 mg/dL). Liver & Pancreas: Recent Labs  Lab 03/04/22 1449 03/05/22 1518 03/07/22 0408 03/08/22 0421 03/09/22 0354 03/10/22 0325 03/11/22 0327  AST 24  --   --   --  15  --  15  ALT 22  --   --   --  14  --  14  ALKPHOS 64  --   --   --  54  --  74  BILITOT 0.9  --   --   --  0.9  --  0.9  PROT 7.5  --   --   --  6.2*  --  6.4*  ALBUMIN 3.3*   < > 2.8* 2.9* 2.5* 2.3* 2.5*   < > = values in this  interval not displayed.   Recent Labs  Lab 03/04/22 1449 03/11/22 0327  LIPASE 21 21   No results for input(s): "AMMONIA" in the last 168 hours. Diabetic: No results for input(s): "HGBA1C" in the last 72 hours.  Recent Labs  Lab 03/10/22 1228 03/10/22 1625 03/10/22 2118 03/11/22 0745 03/11/22 1114  GLUCAP 171* 134* 146* 173* 111*   Cardiac Enzymes: No results for input(s): "CKTOTAL", "CKMB", "CKMBINDEX", "TROPONINI" in the last 168 hours. No results for input(s): "PROBNP" in the last 8760 hours. Coagulation Profile: No results for input(s): "INR", "PROTIME" in the last 168 hours.  Thyroid Function Tests: No results for input(s): "TSH", "T4TOTAL", "FREET4", "T3FREE", "THYROIDAB" in the last 72 hours. Lipid Profile: No results for input(s): "CHOL", "HDL", "LDLCALC", "TRIG", "CHOLHDL", "LDLDIRECT" in the last 72 hours. Anemia Panel: No results for input(s): "VITAMINB12", "FOLATE", "FERRITIN", "TIBC", "IRON", "RETICCTPCT" in the last 72 hours.  Urine analysis:    Component Value Date/Time   COLORURINE YELLOW 02/15/2022 1059   APPEARANCEUR CLEAR 02/15/2022 1059   LABSPEC 1.013 02/15/2022 1059   PHURINE 7.0 02/15/2022 1059   GLUCOSEU NEGATIVE 02/15/2022 1059   HGBUR SMALL (A) 02/15/2022 1059   BILIRUBINUR NEGATIVE 02/15/2022 1059   BILIRUBINUR NEGATIVE 10/22/2018 1556   [place] 02/15/2022 1059   PROTEINUR NEGATIVE 02/15/2022 1059   UROBILINOGEN 0.2 10/22/2018 1556   NITRITE NEGATIVE 02/15/2022 1059   LEUKOCYTESUR SMALL (A) 02/15/2022 1059   Sepsis Labs: Invalid input(s): "PROCALCITONIN", "LACTICIDVEN"  Microbiology: No results found for this or any previous visit (from the past 240 hour(s)).  Radiology Studies: No results found.    Jesselee Poth T. Capron  If 7PM-7AM, please contact night-coverage [email or web address] 03/11/2022, 2:35 PM

## 2022-03-11 NOTE — H&P (View-Only) (Signed)
Daily Progress Note  Hospital Day: 8  Chief Complaint:   Brief History Melanie Alvarez is a 66 y.o. female with a pmh not limited to metastatic pancreatic cancer, SMA and SV occlusion admitted with nausea, vomiting and abdominal pain. See our 7/19 consult note for details    Assessment / Plan   # Stage IV pancreatic cancer. Initially has stage II / III disease when diagnosed in 2021. She is s/p placement of SEMS for biliary obstruction.  Had recurrence / progression in May 2023, now has stage IV disease and periportal adenopathy and SMV /splenic vein thrombosis  # Duodenal obstruction related to extrinsic compression form pancreatic cancer. Admitted with abdominal pain, nausea / vomiting.  No nausea today, tolerating clear liquids. Diet not yet advanced to full liquids ( patient reluctant) Will discuss palliative duodenal stent with Dr. Rush Landmark. Eliquis is on hold. Will need to hold IV heparin when / if decide to proceed with stent.   # Hyponatremia / hypomagnesemia.  Na +128. Mg+ 1.5. K+ normal at 3.9  # History of hepatic abscess June 2023. 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 She has mid abdominal discomfort. Stools are still formed but a little more loose than her normal.   Objective   Endoscopic studies:   03/08/22 EGD - The examined esophagus was normal. Bilious fluid 600cc was found and suctioned in the gastric fundus and in the gastric body. An endoclip previously placed was found in the cardia.The exam of the stomach was otherwise normal. SEMS bilary stent in the second portion of the duodenum. An acquired extrinsic moderate stenosis was found in the second portion of the duodenum and was traversed. A few localized erosions without bleeding were found in the second portion of the duodenum near the SEMS.   Lab Results: Recent Labs    03/09/22 0354 03/10/22 0325 03/11/22 0327   WBC 17.7* 13.5* 13.4*  HGB 10.9* 10.5* 10.6*  HCT 32.6* 31.5* 32.0*  PLT 232 220 250   BMET Recent Labs    03/09/22 0354 03/10/22 0325 03/11/22 0327  NA 130* 130* 128*  K 4.0 3.9 3.9  CL 102 102 98  CO2 '23 22 22  '$ GLUCOSE 151* 102* 160*  BUN 6* <5* <5*  CREATININE 0.54 0.53 0.48  CALCIUM 8.0* 8.2* 8.1*   LFT Recent Labs    03/11/22 0327  PROT 6.4*  ALBUMIN 2.5*  AST 15  ALT 14  ALKPHOS 74  BILITOT 0.9   PT/INR No results for input(s): "LABPROT", "INR" in the last 72 hours.   Scheduled inpatient medications:   amLODipine  10 mg Oral QHS   Chlorhexidine Gluconate Cloth  6 each Topical Daily   fentaNYL  1 patch Transdermal Q72H   insulin aspart  0-5 Units Subcutaneous QHS   insulin aspart  0-9 Units Subcutaneous TID WC   ondansetron (ZOFRAN) IV  4 mg Intravenous Q6H   pantoprazole  40 mg Oral BID   sodium chloride flush  10-40 mL Intracatheter Q12H   Continuous inpatient infusions:   0.9 % NaCl with KCl 20 mEq / L 100 mL/hr at 03/11/22 0131   heparin 1,000 Units/hr (03/10/22 2149)   magnesium sulfate bolus IVPB     PRN inpatient medications: HYDROmorphone (DILAUDID) injection, labetalol, lip balm, loperamide, morphine injection, mouth rinse, prochlorperazine, sodium chloride flush  Vital signs in last 24 hours: Temp:  [98.1 F (36.7  C)-99.2 F (37.3 C)] 98.6 F (37 C) (07/24 0300) Pulse Rate:  [96-100] 100 (07/24 0300) Resp:  [18-20] 20 (07/24 0300) BP: (115-147)/(71-81) 130/71 (07/24 0300) SpO2:  [97 %-100 %] 99 % (07/24 0300) Last BM Date : 03/09/22  Intake/Output Summary (Last 24 hours) at 03/11/2022 0816 Last data filed at 03/11/2022 0400 Gross per 24 hour  Intake 2334.67 ml  Output --  Net 2334.67 ml     Physical Exam:  General: Alert female in NAD Heart:  Regular rate and rhythm. No lower extremity edema Pulmonary: Normal respiratory effort Abdomen: Soft, nondistended, nontender. Normal bowel sounds.  Neurologic: Alert and  oriented Psych: Pleasant. Cooperative.    Intake/Output from previous day: 07/23 0701 - 07/24 0700 In: 2574.7 [P.O.:360; I.V.:2214.7] Out: -  Intake/Output this shift: No intake/output data recorded.    Principal Problem:   Hyponatremia Active Problems:   Hypertension   Hypokalemia   Cirrhosis of liver (HCC)   Malignant neoplasm of pancreas [facility])   Pancreatic insufficiency   Mesenteric vein thrombosis (HCC)   Acute thrombosis of splenic vein   Intractable nausea and vomiting   Abdominal pain, epigastric   Hypomagnesemia   Duodenal obstruction, acquired   Biliary obstruction     LOS: 6 days   Tye Savoy ,NP 03/11/2022, 8:16 AM

## 2022-03-11 NOTE — Evaluation (Signed)
Occupational Therapy Evaluation Patient Details Name: Melanie Alvarez MRN: [id number] DOB: Mar 24, 1956 Today's Date: 03/11/2022   History of Present Illness Patient is a 66 year old female who presented to the hosptial from GI with nausea, abdominal pain and vomitting. patient was admitted with intractable nausea/vomitting, hyponatremia, hypokalemia, hypomagnesemia.     PMH: liver cirrhosis, Hep C, recurrent pancreatic cancer, hepatic abscess, Dm II, HTN, HLD, portal and mesentric vein thrombosis.   Clinical Impression   Patient evaluated by Occupational Therapy with no further acute OT needs identified. All education has been completed and the patient has no further questions. Patient is supervision for ADLs with safety cor line management at this time. Patient endorses being at baseline at this time.  See below for any follow-up Occupational Therapy or equipment needs. OT is signing off. Thank you for this referral.       Recommendations for follow up therapy are one component of a multi-disciplinary discharge planning process, led by the attending physician.  Recommendations may be updated based on patient status, additional functional criteria and insurance authorization.   Follow Up Recommendations  No OT follow up    Assistance Recommended at Discharge PRN  Patient can return home with the following Assistance with cooking/housework;Assist for transportation;Help with stairs or ramp for entrance    Functional Status Assessment  Patient has not had a recent decline in their functional status  Equipment Recommendations       Recommendations for Other Services       Precautions / Restrictions Restrictions Weight Bearing Restrictions: No      Mobility Bed Mobility Overal bed mobility: Modified Independent                  Transfers                          Balance Overall balance assessment: No apparent balance deficits (not formally assessed)                                          ADL either performed or assessed with clinical judgement   ADL Overall ADL's : At baseline Eating/Feeding: Set up;Sitting   Grooming: Wash/dry face;Wash/dry hands;Set up;Sitting Grooming Details (indicate cue type and reason): on bench by window Upper Body Bathing: Sitting;Set up;Supervision/ safety   Lower Body Bathing: Set up;Sit to/from stand;Sitting/lateral leans;Supervison/ safety Lower Body Bathing Details (indicate cue type and reason): with safety for line management HR noted to increase to 125 bpm with washing at e                       General ADL Comments: Patient was supervision with set up for ADLs sitting on bench in room. patient was able to complete UB and LB dressing/bathing tasks with supervision seated with HR noted to increase to 125 bpm with activity. MD made aware. patient participated in functional mobility in the hallway with MI with IV pole with HR noted to range from 112 to 130 bpm with mobility. patient asymptomatic. patietn was educated on ECT.     Vision Patient Visual Report: No change from baseline       Perception     Praxis      Pertinent Vitals/Pain Pain Assessment Pain Assessment: No/denies pain     Hand Dominance     Extremity/Trunk Assessment Upper  Extremity Assessment Upper Extremity Assessment: Overall WFL for tasks assessed   Lower Extremity Assessment Lower Extremity Assessment: Defer to PT evaluation   Cervical / Trunk Assessment Cervical / Trunk Assessment: Normal   Communication Communication Communication: No difficulties   Cognition Arousal/Alertness: Awake/alert Behavior During Therapy: WFL for tasks assessed/performed Overall Cognitive Status: Within Functional Limits for tasks assessed                                       General Comments       Exercises     Shoulder Instructions      Home Living Family/patient expects to be  discharged to:: Private residence Living Arrangements: Spouse/significant other Available Help at Discharge: Family;Available 24 hours/day   Home Access: Stairs to enter Entrance Stairs-Number of Steps: 4 Entrance Stairs-Rails: Can reach both;Left;Right Home Layout: One level               Home Equipment: Rollator (4 wheels)   Additional Comments: sister to assist at home      Prior Functioning/Environment Prior Level of Function : Independent/Modified Independent             Mobility Comments: walked without AD ADLs Comments: independent        OT Problem List:        OT Treatment/Interventions:      OT Goals(Current goals can be found in the care plan section) Acute Rehab OT Goals OT Goal Formulation: All assessment and education complete, DC therapy  OT Frequency:      Co-evaluation              AM-PAC OT "6 Clicks" Daily Activity     Outcome Measure Help from another person eating meals?: None Help from another person taking care of personal grooming?: None Help from another person toileting, which includes using toliet, bedpan, or urinal?: None Help from another person bathing (including washing, rinsing, drying)?: None Help from another person to put on and taking off regular upper body clothing?: None Help from another person to put on and taking off regular lower body clothing?: None 6 Click Score: 24   End of Session Nurse Communication: Mobility status;Other (comment) (HR)  Activity Tolerance: Patient tolerated treatment well Patient left: in chair;with call bell/phone within reach                   Time: 0810-0839 OT Time Calculation (min): 29 min Charges:  OT General Charges $OT Visit: 1 Visit OT Evaluation $OT Eval Low Complexity: 1 Low OT Treatments $Self Care/Home Management : 8-22 mins  Melanie Alvarez OTR/L, MS Acute Rehabilitation Department Office# [phone] Pager# [phone]   Melanie Alvarez 03/11/2022, 12:16 PM

## 2022-03-12 ENCOUNTER — Inpatient Hospital Stay (HOSPITAL_COMMUNITY): Payer: BC Managed Care – PPO | Admitting: Certified Registered Nurse Anesthetist

## 2022-03-12 ENCOUNTER — Encounter (HOSPITAL_COMMUNITY): Payer: Self-pay | Admitting: Student

## 2022-03-12 ENCOUNTER — Encounter (HOSPITAL_COMMUNITY)
Admission: EM | Disposition: A | Discharge status: Home / Self Care | Payer: Self-pay | Source: Ambulatory Visit | Attending: Student

## 2022-03-12 ENCOUNTER — Inpatient Hospital Stay (HOSPITAL_COMMUNITY): Payer: BC Managed Care – PPO

## 2022-03-12 DIAGNOSIS — K269 Duodenal ulcer, unspecified as acute or chronic, without hemorrhage or perforation: Secondary | ICD-10-CM | POA: Diagnosis not present

## 2022-03-12 DIAGNOSIS — I82890 Acute embolism and thrombosis of other specified veins: Secondary | ICD-10-CM | POA: Diagnosis not present

## 2022-03-12 DIAGNOSIS — E871 Hypo-osmolality and hyponatremia: Secondary | ICD-10-CM | POA: Diagnosis not present

## 2022-03-12 DIAGNOSIS — K7460 Unspecified cirrhosis of liver: Secondary | ICD-10-CM | POA: Diagnosis not present

## 2022-03-12 DIAGNOSIS — K315 Obstruction of duodenum: Secondary | ICD-10-CM | POA: Diagnosis not present

## 2022-03-12 DIAGNOSIS — E876 Hypokalemia: Secondary | ICD-10-CM | POA: Diagnosis not present

## 2022-03-12 HISTORY — PX: ESOPHAGOGASTRODUODENOSCOPY (EGD) WITH PROPOFOL: SHX5813

## 2022-03-12 HISTORY — PX: DUODENAL STENT PLACEMENT: SHX5541

## 2022-03-12 LAB — RENAL FUNCTION PANEL
Albumin: 2.8 g/dL — ABNORMAL LOW (ref 3.5–5.0)
Anion gap: 9 (ref 5–15)
BUN: <5 mg/dL — ABNORMAL LOW (ref 8–23)
CO2: 24 mmol/L (ref 22–32)
Calcium: 8.1 mg/dL — ABNORMAL LOW (ref 8.9–10.3)
Chloride: 98 mmol/L (ref 98–111)
Creatinine, Ser: 0.44 mg/dL (ref 0.44–1.00)
GFR, Estimated: >60 mL/min (ref >60)
Glucose, Bld: 106 mg/dL — ABNORMAL HIGH (ref 70–99)
Phosphorus: 3.3 mg/dL (ref 2.5–4.6)
Potassium: 3.8 mmol/L (ref 3.5–5.1)
Sodium: 131 mmol/L — ABNORMAL LOW (ref 135–145)

## 2022-03-12 LAB — HEPARIN LEVEL (UNFRACTIONATED)
Heparin Unfractionated: 0.19 IU/mL — ABNORMAL LOW (ref 0.30–0.70)
Heparin Unfractionated: 0.15 IU/mL — ABNORMAL LOW (ref 0.30–0.70)

## 2022-03-12 LAB — GLUCOSE, CAPILLARY
Glucose-Capillary: 112 mg/dL — ABNORMAL HIGH (ref 70–99)
Glucose-Capillary: 111 mg/dL — ABNORMAL HIGH (ref 70–99)
Glucose-Capillary: 138 mg/dL — ABNORMAL HIGH (ref 70–99)
Glucose-Capillary: 113 mg/dL — ABNORMAL HIGH (ref 70–99)

## 2022-03-12 LAB — MAGNESIUM: Magnesium: 1.9 mg/dL (ref 1.7–2.4)

## 2022-03-12 LAB — CBC
HCT: 32.8 % — ABNORMAL LOW (ref 36.0–46.0)
Hemoglobin: 11.3 g/dL — ABNORMAL LOW (ref 12.0–15.0)
MCH: 29.2 pg (ref 26.0–34.0)
MCHC: 34.5 g/dL (ref 30.0–36.0)
MCV: 84.8 fL (ref 80.0–100.0)
Platelets: 293 10*3/uL (ref 150–400)
RBC: 3.87 MIL/uL (ref 3.87–5.11)
RDW: 15.8 % — ABNORMAL HIGH (ref 11.5–15.5)
WBC: 16.2 10*3/uL — ABNORMAL HIGH (ref 4.0–10.5)
nRBC: 0 % (ref 0.0–0.2)

## 2022-03-12 LAB — LIPASE, BLOOD: Lipase: 20 U/L (ref 11–51)

## 2022-03-12 SURGERY — ESOPHAGOGASTRODUODENOSCOPY (EGD) WITH PROPOFOL
Anesthesia: General

## 2022-03-12 MED ORDER — LACTATED RINGERS IV SOLN: INTRAVENOUS | Status: DC | PRN

## 2022-03-12 MED ORDER — LIDOCAINE 2% (20 MG/ML) 5 ML SYRINGE
INTRAMUSCULAR | Status: DC | PRN
  Administered 2022-03-12: 60 mg via INTRAVENOUS

## 2022-03-12 MED ORDER — CIPROFLOXACIN IN D5W 400 MG/200ML IV SOLN
INTRAVENOUS | Status: AC
  Filled 2022-03-12: qty 200

## 2022-03-12 MED ORDER — CIPROFLOXACIN IN D5W 400 MG/200ML IV SOLN
400.0000 mg | Freq: Once | INTRAVENOUS | Status: AC
  Administered 2022-03-12: 400 mg via INTRAVENOUS

## 2022-03-12 MED ORDER — HEPARIN (PORCINE) 25000 UT/250ML-% IV SOLN
1350.0000 [IU]/h | INTRAVENOUS | Status: DC
  Administered 2022-03-12: 1150 [IU]/h via INTRAVENOUS
  Administered 2022-03-13: 1350 [IU]/h via INTRAVENOUS
  Filled 2022-03-12: qty 250

## 2022-03-12 MED ORDER — FENTANYL CITRATE (PF) 100 MCG/2ML IJ SOLN
INTRAMUSCULAR | Status: DC | PRN
  Administered 2022-03-12 (×2): 50 ug via INTRAVENOUS

## 2022-03-12 MED ORDER — FENTANYL CITRATE (PF) 100 MCG/2ML IJ SOLN
INTRAMUSCULAR | Status: AC
  Filled 2022-03-12: qty 2

## 2022-03-12 MED ORDER — SUGAMMADEX SODIUM 200 MG/2ML IV SOLN
INTRAVENOUS | Status: DC | PRN
  Administered 2022-03-12: 200 mg via INTRAVENOUS

## 2022-03-12 MED ORDER — PHENYLEPHRINE 80 MCG/ML (10ML) SYRINGE FOR IV PUSH (FOR BLOOD PRESSURE SUPPORT)
PREFILLED_SYRINGE | INTRAVENOUS | Status: DC | PRN
  Administered 2022-03-12 (×2): 80 ug via INTRAVENOUS
  Administered 2022-03-12: 160 ug via INTRAVENOUS

## 2022-03-12 MED ORDER — PROPOFOL 10 MG/ML IV BOLUS
INTRAVENOUS | Status: DC | PRN
  Administered 2022-03-12: 100 mg via INTRAVENOUS

## 2022-03-12 MED ORDER — SUCCINYLCHOLINE CHLORIDE 200 MG/10ML IV SOSY
PREFILLED_SYRINGE | INTRAVENOUS | Status: DC | PRN
  Administered 2022-03-12: 40 mg via INTRAVENOUS

## 2022-03-12 MED ORDER — PROPOFOL 500 MG/50ML IV EMUL
INTRAVENOUS | Status: AC
  Filled 2022-03-12: qty 50

## 2022-03-12 MED ORDER — ROCURONIUM BROMIDE 10 MG/ML (PF) SYRINGE
PREFILLED_SYRINGE | INTRAVENOUS | Status: DC | PRN
  Administered 2022-03-12: 40 mg via INTRAVENOUS

## 2022-03-12 SURGICAL SUPPLY — 15 items

## 2022-03-12 NOTE — Anesthesia Preprocedure Evaluation (Signed)
Anesthesia Evaluation  Patient identified by MRN, date of birth, ID band Patient awake    Reviewed: Allergy & Precautions, NPO status , Patient's Chart, lab work & pertinent test results, reviewed documented beta blocker date and time   Airway Mallampati: II  TM Distance: >3 FB Neck ROM: Full    Dental  (+) Dental Advisory Given, Missing, Poor Dentition, Loose,    Pulmonary shortness of breath and with exertion, former smoker,  Quit smoking 2021   Pulmonary exam normal breath sounds clear to auscultation       Cardiovascular hypertension, Pt. on medications and Pt. on home beta blockers +CHF (lvef 63-87%, grade 1 diastolic dysfunction)  Normal cardiovascular exam+ Valvular Problems/Murmurs (mild AS) AS  Rhythm:Regular Rate:Normal   portal vein and mesenteric vein thrombosis- on eliquis  Last echo 12/2021 1. Left ventricular ejection fraction, by estimation, is 40 to 45%. The  left ventricle has mildly decreased function. The left ventricle  demonstrates global hypokinesis. Left ventricular diastolic parameters are  consistent with Grade I diastolic  dysfunction (impaired relaxation). Elevated left ventricular end-diastolic  pressure.  2. Right ventricular systolic function is normal. The right ventricular  size is normal.  3. The mitral valve is normal in structure. Trivial mitral valve  regurgitation. No evidence of mitral stenosis.  4. The aortic valve is tricuspid. Aortic valve regurgitation is not  visualized. Mild aortic valve stenosis. Aortic valve area, by VTI measures  1.62 cm. Aortic valve mean gradient measures 11.0 mmHg. Aortic valve Vmax  measures 2.28 m/s.  5. The inferior vena cava is normal in size with greater than 50%  respiratory variability, suggesting right atrial pressure of 3 mmHg.    Neuro/Psych negative neurological ROS  negative psych ROS   GI/Hepatic GERD  Medicated and Controlled,(+)  Hepatitis - (HCV s/p treatment), CPancreatic ca, persistent N/V Duodenal obstruction   Endo/Other  diabetes, Well Controlled, Type 2, Oral Hypoglycemic Agents  Renal/GU negative Renal ROS  negative genitourinary   Musculoskeletal  (+) Arthritis , Osteoarthritis,    Abdominal   Peds  Hematology  (+) Blood dyscrasia, anemia , Hb 10.6   Anesthesia Other Findings Chronic pain: dilaudid '4mg'$ , fent patch  Reproductive/Obstetrics negative OB ROS                             Anesthesia Physical  Anesthesia Plan  ASA: 3  Anesthesia Plan: General   Post-op Pain Management: Minimal or no pain anticipated   Induction: Intravenous, Rapid sequence and Cricoid pressure planned  PONV Risk Score and Plan: 2 and Treatment may vary due to age or medical condition and Ondansetron  Airway Management Planned: Oral ETT  Additional Equipment: None  Intra-op Plan:   Post-operative Plan: Extubation in OR  Informed Consent: I have reviewed the patients History and Physical, chart, labs and discussed the procedure including the risks, benefits and alternatives for the proposed anesthesia with the patient or authorized representative who has indicated his/her understanding and acceptance.     Dental advisory given  Plan Discussed with: CRNA, Surgeon and Anesthesiologist  Anesthesia Plan Comments:         Anesthesia Quick Evaluation

## 2022-03-12 NOTE — Progress Notes (Signed)
PROGRESS NOTE  Melanie Alvarez DGU:[id number] DOB: January 30, 1956   PCP: Flossie Buffy, NP  Patient is from: Home.  Lives with husband.  Independently ambulates at baseline.  DOA: 03/04/2022 LOS: 7  Chief complaints Chief Complaint  Patient presents with   Abnormal Labs     Brief Narrative / Interim history: 66 year old F with PMH of liver cirrhosis, treated hep C, recurrent pancreatic cancer with insufficiency on systemic chemo, portal vein and mesenteric vein thrombosis on Eliquis, history of hepatic abscess, DM-2, HTN and HLD presented to her gastroenterologist with nausea, vomiting and abdominal pain.  Lab work at the GI office concerning for hyponatremia and hypokalemia, and patient was directed to ED.  Of note, patient was off Eliquis for 2 weeks.  She did not know she was supposed to continue after starter pack.  In ED, Na 122.  K2.8.  CT abdomen and pelvis showed no acute finding but stable mild intra and extrahepatic biliary ductal dilation with palliative metabolic biliary stent in place, stable pancreatic mass with occlusion of the mesenteric and splenic vein.    Electrolytes improved.  Still with intractable nausea, vomiting and abdominal pain.  Oncology and GI consulted.  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  She had duodenal stent placed on 7/25.  GI following.  Subjective: Seen and examined this afternoon after she returned from duodenal stent placement.  No major events overnight of this morning.  Continues to endorse abdominal pain and asking for IV pain medication although she does not seem to be in that much distress.  Patient's sister at bedside.  Objective: Vitals:   03/12/22 1120 03/12/22 1130 03/12/22 1140 03/12/22 1226  BP: (!) 155/89 (!) 164/112 (!) 151/111 120/78  Pulse: (!) 102 (!) 103 (!) 109 (!) 110   Resp: (!) 24 20 (!) 22 20  Temp:      TempSrc:      SpO2: 95% 95% 98% 100%  Weight:      Height:        Examination:  GENERAL: No apparent distress.  Nontoxic. HEENT: MMM.  Vision and hearing grossly intact.  NECK: Supple.  No apparent JVD.  RESP:  No IWOB.  Fair aeration bilaterally. CVS:  RRR. Heart sounds normal.  ABD/GI/GU: BS+. Abd soft.  Mild diffuse abdominal tenderness. MSK/EXT:  Moves extremities. No apparent deformity. No edema.  SKIN: no apparent skin lesion or wound NEURO: Awake and alert. Oriented appropriately.  No apparent focal neuro deficit. PSYCH: Calm. Normal affect.   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 7/25-duodenal stent placement  Microbiology summarized: None  Assessment and plan: Principal Problem:   Hyponatremia Active Problems:   Hypertension   Hypokalemia   Cirrhosis of liver (HCC)   Malignant neoplasm of pancreas (HCC)   Pancreatic insufficiency   Mesenteric vein thrombosis (HCC)   Acute thrombosis of splenic vein   Intractable nausea and vomiting   Abdominal pain, epigastric   Hypomagnesemia   Duodenal obstruction, acquired   Biliary obstruction  Intractable nausea/vomiting/abdominal pain: EGD with duodenitis and partial duodenal obstruction due to pancreatic mass.  CT abdomen without acute finding but chronic intra and extra biliary ductal dilation with pancreatic mass.  Continues to endorse nausea and abdominal pain.  -7/25-duodenal stent placed -Continue IV fluid, antiemetics,  PPI, Carafate and analgesics. -Continue CLD for the next 24 hours -Did not tolerate increased dose of fentanyl patch to 50 mcg although her symptoms could be from IV Dilaudid -Resume IV heparin 6 hours after duodenal stent  Hyponatremia: Na 122>> 132>> 131.  Urine sodium elevated 276 suggesting  some degree of SIADH. -Discontinue IV fluid.  Hypokalemia/hypomagnesemia: -Monitor and replace as appropriate.   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  Patient thought she did not need Eliquis and stopped taking after starter pack. -Resume IV heparin about 6 hours after duodenal stent   Malignant neoplasm of pancreas: Followed by Dr. Marin Olp.  On systemic chemo. 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 -Continue monitoring  Controlled NIDDM-2 with hyperglycemia: A1c 5.7%.  On glipizide and metformin at home. Recent Labs  Lab 03/11/22 1114 03/11/22 1615 03/11/22 2104 03/12/22 0739 03/12/22 1211  GLUCAP 111* 140* 83 112* 111*  -Continue SSI-sensitive  History of hepatic abscess-treated and resolved.  Status post drain removal on 6/29.  Essential hypertension: Normotensive for most part. -Continue amlodipine -Discontinued Avapro with the hope to help hyponatremia  Normocytic anemia: No report of melena or hematochezia.  Anemia panel suggests ACD Recent Labs    02/15/22 0848 03/04/22 1136 03/04/22 1449 03/06/22 0838 03/07/22 0408 03/08/22 0421 03/09/22 0354 03/10/22 0325 03/11/22 0327 03/12/22 0338  HGB 12.1 12.1 11.3* 11.2* 10.7* 10.6* 10.9* 10.5* 10.6* 11.3*  -Continue monitoring  Mobility, has not gotten out of the bed -OOB for meals and ambulate in the hall -PT/OT  Leukocytosis: Likely demargination  Body mass index is 26.57 kg/m.           DVT prophylaxis:  On full dose anticoagulation with IV heparin  Code Status: Full code Family Communication: Updated patient's sister at bedside. Level of care: Med-Surg Status is: Inpatient Remains inpatient appropriate because: Intractable nausea and abdominal pain/cancer pain.   Final disposition: Home once medically cleared Consultants:  Oncology Gastroenterology  Sch Meds:  Scheduled Meds:   amLODipine  10 mg Oral QHS   Chlorhexidine Gluconate Cloth  6 each Topical Daily   fentaNYL  1 patch Transdermal Q72H   insulin aspart  0-5 Units Subcutaneous QHS   insulin aspart  0-9 Units Subcutaneous TID WC   ondansetron (ZOFRAN) IV  4 mg Intravenous Q6H   pantoprazole  40 mg Oral BID   sodium chloride flush  10-40 mL Intracatheter Q12H   Continuous Infusions:  0.9 % NaCl with KCl 20 mEq / L 100 mL/hr at 03/12/22 0919   heparin     PRN Meds:.HYDROmorphone (DILAUDID) injection, labetalol, lip balm, loperamide, mouth rinse, prochlorperazine, sodium chloride flush  Antimicrobials: Anti-infectives (From admission, onward)    Start     Dose/Rate Route Frequency Ordered Stop   03/12/22 1100  ciprofloxacin (CIPRO) IVPB 400 mg        400 mg 200 mL/hr over 60 Minutes Intravenous  Once 03/12/22 1058 03/12/22 1226        I have personally reviewed the following labs and images: CBC: Recent Labs  Lab 03/08/22 0421 03/09/22 0354 03/10/22 0325 03/11/22 0327 03/12/22 0338  WBC 9.2 17.7* 13.5* 13.4* 16.2*  HGB 10.6* 10.9* 10.5* 10.6* 11.3*  HCT 31.8* 32.6* 31.5* 32.0* 32.8*  MCV 86.4 86.5 86.1 86.3 84.8  PLT 239 232 220 250 293   BMP &GFR Recent Labs  Lab 03/08/22 0421 03/09/22 0354 03/10/22 0325 03/11/22 0327 03/12/22 0338  NA 132* 130*  130* 128* 131*  K 3.9 4.0 3.9 3.9 3.8  CL 102 102 102 98 98  CO2 '24 23 22 22 24  '$ GLUCOSE 118* 151* 102* 160* 106*  BUN <5* 6* <5* <5* <5*  CREATININE 0.47 0.54 0.53 0.48 0.44  CALCIUM 8.7* 8.0* 8.2* 8.1* 8.1*  MG 1.6* 1.7 1.5* 1.5* 1.9  PHOS 3.3 3.8 3.0 2.7 3.3   Estimated Creatinine Clearance: 65.7 mL/min (by C-G formula based on SCr of 0.44 mg/dL). Liver & Pancreas: Recent Labs  Lab 03/08/22 0421 03/09/22 0354 03/10/22 0325 03/11/22 0327 03/12/22 0338  AST  --  15  --  15  --   ALT  --  14  --  14  --   ALKPHOS  --  54  --  74  --   BILITOT  --  0.9  --  0.9  --   PROT  --  6.2*  --  6.4*  --   ALBUMIN 2.9* 2.5* 2.3*  2.5* 2.8*   Recent Labs  Lab 03/11/22 0327 03/12/22 0338  LIPASE 21 20   No results for input(s): "AMMONIA" in the last 168 hours. Diabetic: No results for input(s): "HGBA1C" in the last 72 hours.  Recent Labs  Lab 03/11/22 1114 03/11/22 1615 03/11/22 2104 03/12/22 0739 03/12/22 1211  GLUCAP 111* 140* 83 112* 111*   Cardiac Enzymes: No results for input(s): "CKTOTAL", "CKMB", "CKMBINDEX", "TROPONINI" in the last 168 hours. No results for input(s): "PROBNP" in the last 8760 hours. Coagulation Profile: No results for input(s): "INR", "PROTIME" in the last 168 hours.  Thyroid Function Tests: No results for input(s): "TSH", "T4TOTAL", "FREET4", "T3FREE", "THYROIDAB" in the last 72 hours. Lipid Profile: No results for input(s): "CHOL", "HDL", "LDLCALC", "TRIG", "CHOLHDL", "LDLDIRECT" in the last 72 hours. Anemia Panel: No results for input(s): "VITAMINB12", "FOLATE", "FERRITIN", "TIBC", "IRON", "RETICCTPCT" in the last 72 hours.  Urine analysis:    Component Value Date/Time   COLORURINE YELLOW 02/15/2022 1059   APPEARANCEUR CLEAR 02/15/2022 1059   LABSPEC 1.013 02/15/2022 1059   PHURINE 7.0 02/15/2022 1059   GLUCOSEU NEGATIVE 02/15/2022 1059   HGBUR SMALL (A) 02/15/2022 1059   BILIRUBINUR NEGATIVE 02/15/2022 1059   BILIRUBINUR NEGATIVE 10/22/2018 1556   [place] 02/15/2022 1059   PROTEINUR NEGATIVE 02/15/2022 1059   UROBILINOGEN 0.2 10/22/2018 1556   NITRITE NEGATIVE 02/15/2022 1059   LEUKOCYTESUR SMALL (A) 02/15/2022 1059   Sepsis Labs: Invalid input(s): "PROCALCITONIN", "LACTICIDVEN"  Microbiology: No results found for this or any previous visit (from the past 240 hour(s)).  Radiology Studies: DG C-Arm 1-60 Min  Result Date: 03/12/2022 CLINICAL DATA:  Fluoroscopic assistance for placement of stent EXAM: DG C-ARM 1-60 MIN FLUOROSCOPY: Fluoroscopy Time:  90 seconds Radiation Exposure Index (if provided by the fluoroscopic device): 16.5 Number of  Acquired Spot Images: 3 COMPARISON:  CT done on 03/04/2022 FINDINGS: Theophilus Kinds is noted in place. In the early images, biliary stent is noted in the course of the common bile duct. In the final image, there is a new stent oriented transversely, possibly in pancreatic duct or bowel lumen. IMPRESSION: Fluoroscopic assistance was provided for stent placement. Electronically Signed   By: Elmer Picker M.D.   On: 03/12/2022 11:13      Nelissa Bolduc T. [place]  If 7PM-7AM, please contact night-coverage [email or web address] 03/12/2022, 12:54 PM

## 2022-03-12 NOTE — Op Note (Signed)
[facility] Patient Name: Melanie Alvarez Procedure Date: 03/12/2022 MRN: [id number] Attending MD: Justice Britain , MD Date of Birth: 02-02-1956 CSN: [id number] Age: 66 Admit Type: Inpatient Procedure:                Upper GI endoscopy Indications:              Generalized abdominal pain, Suspected stenosis of                            the duodenum, For therapy of duodenal stenosis,                            Duodenal obstruction, Nausea with vomiting Providers:                Justice Britain, MD, Elmer Ramp. Tilden Dome, RN,                            William Dalton, Technician Referring MD:             Rudell Cobb. Ennever MD, MD, [facility],                            Inpatient [place] [facility] Team Medicines:                General Anesthesia, Cipro 620 mg IV Complications:            No immediate complications. Estimated Blood Loss:     Estimated blood loss was minimal. Procedure:                Pre-Anesthesia Assessment:                           - Prior to the procedure, a History and Physical                            was performed, and patient medications and                            allergies were reviewed. The patient's tolerance of                            previous anesthesia was also reviewed. The risks                            and benefits of the procedure and the sedation                            options and risks were discussed with the patient.                            All questions were answered, and informed consent                            was obtained. Prior Anticoagulants: The patient has  taken heparin, last dose was day of procedure. ASA                            Grade Assessment: III - A patient with severe                            systemic disease. After reviewing the risks and                            benefits, the patient was deemed in satisfactory                            condition to undergo the  procedure.                           After obtaining informed consent, the endoscope was                            passed under direct vision. Throughout the                            procedure, the patient's blood pressure, pulse, and                            oxygen saturations were monitored continuously. The                            [id number] ([id number]) Olympus therapeutic endoscope                            was introduced through the mouth, and advanced to                            the fourth part of duodenum. The upper GI endoscopy                            was accomplished without difficulty. The patient                            tolerated the procedure. Scope In: Scope Out: Findings:      No gross lesions were noted in the entire esophagus.      The Z-line was irregular and was found 35 cm from the incisors.      Retained fluid was found in the gastric body. Suction via Endoscope was       performed with approximately 200 cc removed.      Patchy mildly erythematous mucosa was found in the entire examined       stomach.      Localized moderately erythematous mucosa without active bleeding and       with no stigmata of bleeding was found in the duodenal bulb.      A previously placed metal biliary stent was seen at the major papilla.      A few localized erosions without bleeding were found in the second  portion of the duodenum on contralateral wall from the biliary stent.      An acquired extrinsic moderate stenosis was found in the second portion       of the duodenum just proximal to the major papillary region/biliary       stent and extending into the third portion of the duodenum. With gentle       pressure and maneuvering, the therapeutic EGD scope was able to traverse       this area. Placement of a long 0.035 inch Soft Jagwire was attempted and       this passed successfully into distal duodenum/jejunum. This was stented       with a 22 mm x 9 cm WallFlex  stent under fluoroscopic guidance. The       biliary stent has been covered by this newly placed UCSEMS.      No gross lesions were noted in the fourth portion of the duodenum. Impression:               - No gross lesions in esophagus. Z-line irregular,                            35 cm from the incisors.                           - Retained gastric fluid - suctioned ~200cc.                           - Erythematous mucosa in the stomach.                           - Erythematous duodenopathy in bulb.                           - Metal biliary stent at ampullary region. Duodenal                            erosions without bleeding on contralateral wall.                           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 22 mm x 9 cm UCSEMS placed. We have                            covered the previously placed biliary stent with                            our new duodenal stent.                           - No gross lesions in the fourth portion of the                            duodenum. Moderate Sedation:      Not Applicable - Patient had care per Anesthesia. Recommendation:           -  The patient will be observed post-procedure,                            until all discharge criteria are met.                           - Return patient to hospital ward for ongoing care.                           - Clear liquid diet today. If patient is doing                            well, then will plan to re-evaluate in the morning.                            May be able to do full liquid and if doing well by                            tomorrow afternoon then pureed diet for 24 hours.                            If doing well, low-residue/low-fiber diet                            thereafter.                           - Advance diet as tolerated.                           - May restart Heparin in 6 hours without bolus                            (500PM on 7/25) and  then restart NOAC if there is                            no evidence of bleeding in 24 hours (PM on 7/26).                           - Periodically monitor LFTs, as the enteral stent                            could jail off the biliary stent. Repeat attempt at                            ERCP could be difficult if at all possible, should                            there be issues of obstruction develop in future,                            but may not know without trying. This had been  discussed with patient prior to her procedure today                            (but with normal LFTs, hopefully this will not be                            an issue for her.                           - Continue present medications.                           - The findings and recommendations were discussed                            with the patient.                           - The findings and recommendations were discussed                            with the patient's family.                           - The findings and recommendations were discussed                            with the referring physician. Procedure Code(s):        --- Professional ---                           [phone], Esophagogastroduodenoscopy, flexible,                            transoral; with placement of endoscopic stent                            (includes pre- and post-dilation and guide wire                            passage, when performed) Diagnosis Code(s):        --- Professional ---                           K22.8, Other specified diseases of esophagus                           K31.89, Other diseases of stomach and duodenum                           K26.9, Duodenal ulcer, unspecified as acute or                            chronic, without hemorrhage or perforation  K31.5, Obstruction of duodenum                           R10.84, Generalized abdominal pain                            R11.2, Nausea with vomiting, unspecified CPT copyright 2019 American Medical Association. All rights reserved. The codes documented in this report are preliminary and upon coder review may  be revised to meet current compliance requirements. Justice Britain, MD 03/12/2022 11:15:40 AM Number of Addenda: 0

## 2022-03-12 NOTE — Interval H&P Note (Signed)
History and Physical Interval Note:  03/12/2022 9:49 AM  Melanie Alvarez  has presented today for surgery, with the diagnosis of duodenal obstruction.  The various methods of treatment have been discussed with the patient and family. After consideration of risks, benefits and other options for treatment, the patient has consented to  Procedure(s) with comments: ESOPHAGOGASTRODUODENOSCOPY (EGD) WITH PROPOFOL (N/A) - placement of duodenal stent DUODENAL STENT PLACEMENT (N/A) as a surgical intervention.  The patient's history has been reviewed, patient examined, no change in status, stable for surgery.  I have reviewed the patient's chart and labs.  Questions were answered to the patient's satisfaction.    I have reviewed the patient's imaging as well.  Overt gastric outlet obstruction via CT was not present however I do see a narrowing in the D2-D3 region with what looks to be decompressed bowel thereafter.  Recent endoscopy report from Dr. Fuller Plan showed concern for narrowing and stenosis at the region of the biliary stent.  We will try to better assess things today and define the length of stent that we could require.  Patient understands that we likely will jail off the biliary stent which could make biliary access in the future more difficult but her LFTs have been normal and thus reasonable for [place] to try.  Even ERCPs through the metal biliary stent could be possible as well in the future though not absolute.  Increased risk of perforation when placing duodenal stents has been discussed.  The risk of migration also has been discussed.   [employer]

## 2022-03-12 NOTE — Progress Notes (Signed)
Overall, everything is about the same.  She cannot tolerate the 50 mcg patch of fentanyl.  As such, I think we will probably have to get her on some oral agent for long-term pain control.  Labs look stable.  White cell count is 16.2.  Hemoglobin 9.3.  Platelet count 293,000.  BUN is less than 5 creatinine 0.44.  Calcium 8.1 with an albumin of 2.8.  She did have a little bit of a bowel movement this morning.  Hopefully, she will have the stent placed today.  She really is only taking in clear liquids right now.  Her vital signs show temperature 98.6.  Pulse 99.  Blood pressure 122/71.  Head neck exam shows no ocular or oral lesions.  There is no scleral icterus.  Lungs are clear bilaterally.  Cardiac exam regular rate and rhythm.  Abdomen is soft.  Bowel sounds are decreased but present.  There is no guarding or rebound tenderness.  Extremity shows no clubbing, cyanosis or edema.  Neurological exam is nonfocal.  Melanie Alvarez will hopefully have the stent placed today.  I am sure that Gastroenterology will do a fantastic job in trying to place is that she will be able to eat.  Ultimately, we really need to get her set up for treatment to try to shrink cancer that is pressing on her intestines.  I do appreciate everybody's help with Melanie Alvarez on 4 E.  I know her birthday is coming up in 3 days.  Hopefully, she will be home to celebrate this.  Lattie Haw, MD  Romans 5:3-5

## 2022-03-12 NOTE — Progress Notes (Signed)
[place] for IV heparin Indication: splenic/SMV thrombosis  Allergies  Allergen Reactions   Lisinopril Swelling and Other (See Comments)    Facial and upper lip    Patient Measurements: Height: '5\' 3"'$  (160 cm) Weight: 68 kg (150 lb) IBW/kg (Calculated) : 52.4 Heparin Dosing Weight: 66 kg  Vital Signs: Temp: 98.6 F (37 C) (07/25 0352) Temp Source: Oral (07/25 0352) BP: 122/71 (07/25 0352) Pulse Rate: 99 (07/24 1913)  Labs: Recent Labs    03/10/22 0325 03/10/22 1204 03/11/22 0327 03/12/22 0338  HGB 10.5*  --  10.6* 11.3*  HCT 31.5*  --  32.0* 32.8*  PLT 220  --  250 293  APTT 84* 84* 84*  --   HEPARINUNFRC >1.10*  --  0.34 0.19*  CREATININE 0.53  --  0.48  --      Estimated Creatinine Clearance: 65.7 mL/min (by C-G formula based on SCr of 0.48 mg/dL).   Assessment: 89 yoF with PMH cirrhosis, HepC, recurrent pancreatic cancer on chemo, portal vein and mesenteric thrombosis (off Apixaban x 2 weeks since she was not aware to continue after start pack) admitted for electrolyte derangements. May need esophageal/duodenal stenting and Apixaban held for potential GI procedures. Pharmacy consulted to dose IV heparin in the meantime.  Baseline INR, aPTT: not done Prior anticoagulation: Apixaban 5 mg PO BID, LD 7/22 @ 0813  Today, 03/12/2022: Heparin level 0.19- subtherapeutic on IV heparin infusion at 1000 units/hr CBC: Hgb 11.3, low but improved; Pltc stable/WNL No bleeding or infusion issues per nursing SCr stable/WNL  Goal of Therapy: Heparin level 0.3-0.7 units/ml Monitor platelets by anticoagulation protocol: Yes  Plan: Increase heparin infusion to 1150 units/hr   D/C heparin at 0600 for procedure  F/U plans to resume anticoagulation post-procedure  Netta Cedars, PharmD, BCPS 03/12/2022, 4:13 AM

## 2022-03-12 NOTE — Progress Notes (Signed)
Mobility Specialist Cancellation/Refusal Note:   Reason for Cancellation/Refusal: Pt declined mobility at this time. Had surgery in the morning and stated she had walked earlier. Will check back as schedule permits.

## 2022-03-12 NOTE — Progress Notes (Signed)
[place] for IV heparin Indication: splenic/SMV thrombosis  Allergies  Allergen Reactions   Lisinopril Swelling and Other (See Comments)    Facial and upper lip    Patient Measurements: Height: '5\' 3"'$  (160 cm) Weight: 68 kg (150 lb) IBW/kg (Calculated) : 52.4 Heparin Dosing Weight: 66 kg  Vital Signs: Temp: 98.6 F (37 C) (07/25 1105) Temp Source: Axillary (07/25 1105) BP: 151/111 (07/25 1140) Pulse Rate: 109 (07/25 1140)  Labs: Recent Labs    03/10/22 0325 03/10/22 1204 03/11/22 0327 03/12/22 0338  HGB 10.5*  --  10.6* 11.3*  HCT 31.5*  --  32.0* 32.8*  PLT 220  --  250 293  APTT 84* 84* 84*  --   HEPARINUNFRC >1.10*  --  0.34 0.19*  CREATININE 0.53  --  0.48 0.44     Estimated Creatinine Clearance: 65.7 mL/min (by C-G formula based on SCr of 0.44 mg/dL).   Assessment: 36 yoF with PMH cirrhosis, HepC, recurrent pancreatic cancer on chemo, portal vein and mesenteric thrombosis (off Apixaban x 2 weeks since she was not aware to continue after start pack) admitted for electrolyte derangements. May need esophageal/duodenal stenting and Apixaban held for potential GI procedures. Pharmacy consulted to dose IV heparin in the meantime.  Baseline INR, aPTT: not done Prior anticoagulation: Apixaban 5 mg PO BID, LD 7/22 @ 0813  Today, 03/12/2022: IV heparin held at 0600 for EGD/duodenal stent placement. Post-procedure, resume IV heparin without bolus at 5pm today per GI recommendations.  CBC: Hgb 11.3, low but improved; Pltc stable/WNL No bleeding issues noted SCr stable/WNL  Goal of Therapy: Heparin level 0.3-0.7 units/ml Monitor platelets by anticoagulation protocol: Yes  Plan: Resume heparin infusion at previous rate of 1150 units/hr at 5pm Heparin level 6 hours after resumption Daily CBC, heparin level Monitor closely for s/sx of bleeding Per GI's note, may restart DOAC if there is no evidence of bleeding in 24 hours (PM of  7/26)--will follow along for transition.    Lindell Spar, PharmD, BCPS Clinical Pharmacist  03/12/2022, 12:13 PM

## 2022-03-12 NOTE — Anesthesia Procedure Notes (Signed)
Procedure Name: Intubation Date/Time: 03/12/2022 10:22 AM  Performed by: Claudia Desanctis, CRNAPre-anesthesia Checklist: Patient identified, Emergency Drugs available, Suction available and Patient being monitored Patient Re-evaluated:Patient Re-evaluated prior to induction Oxygen Delivery Method: Circle system utilized Preoxygenation: Pre-oxygenation with 100% oxygen Induction Type: IV induction, Rapid sequence and Cricoid Pressure applied Laryngoscope Size: Miller and 3 Grade View: Grade I Tube type: Oral Number of attempts: 1 Airway Equipment and Method: Stylet Placement Confirmation: ETT inserted through vocal cords under direct vision, positive ETCO2 and breath sounds checked- equal and bilateral Secured at: 20 cm Tube secured with: Tape Dental Injury: Teeth and Oropharynx as per pre-operative assessment

## 2022-03-12 NOTE — Anesthesia Postprocedure Evaluation (Signed)
Anesthesia Post Note  Patient: Melanie Alvarez  Procedure(s) Performed: ESOPHAGOGASTRODUODENOSCOPY (EGD) WITH PROPOFOL DUODENAL STENT PLACEMENT     Patient location during evaluation: PACU Anesthesia Type: General Level of consciousness: awake and alert and oriented Pain management: pain level controlled Vital Signs Assessment: post-procedure vital signs reviewed and stable Respiratory status: spontaneous breathing, nonlabored ventilation and respiratory function stable Cardiovascular status: blood pressure returned to baseline and stable Postop Assessment: no apparent nausea or vomiting Anesthetic complications: no   No notable events documented.  Last Vitals:  Vitals:   03/12/22 1110 03/12/22 1120  BP: (!) 152/88 (!) 155/89  Pulse: (!) 126 (!) 102  Resp: (!) 27 (!) 24  Temp:    SpO2: (!) 86% 95%    Last Pain:  Vitals:   03/12/22 1120  TempSrc:   PainSc: 0-No pain                 [place]eston Vierling A.

## 2022-03-12 NOTE — Transfer of Care (Signed)
Immediate Anesthesia Transfer of Care Note  Patient: Christiana Fuchs  Procedure(s) Performed: ESOPHAGOGASTRODUODENOSCOPY (EGD) WITH PROPOFOL DUODENAL STENT PLACEMENT  Patient Location: Endoscopy Unit  Anesthesia Type:General  Level of Consciousness: awake, alert , oriented and patient cooperative  Airway & Oxygen Therapy: Patient Spontanous Breathing and Patient connected to face mask  Post-op Assessment: Report given to RN and Post -op Vital signs reviewed and stable  Post vital signs: Reviewed and stable  Last Vitals:  Vitals Value Taken Time  BP 144/88 03/12/22 1106  Temp    Pulse 111 03/12/22 1108  Resp 18 03/12/22 1108  SpO2 87 % 03/12/22 1108  Vitals shown include unvalidated device data.  Last Pain:  Vitals:   03/12/22 0935  TempSrc: Temporal  PainSc: 6       Patients Stated Pain Goal: 3 (79/81/02 5486)  Complications: No notable events documented.

## 2022-03-12 NOTE — Discharge Instructions (Signed)
YOU HAD AN ENDOSCOPIC PROCEDURE TODAY: Refer to the procedure report and other information in the discharge instructions given to you for any specific questions about what was found during the examination. If this information does not answer your questions, please call [place] office at [phone] to clarify.   YOU SHOULD EXPECT: Some feelings of bloating in the abdomen. Passage of more gas than usual. Walking can help get rid of the air that was put into your GI tract during the procedure and reduce the bloating. If you had a lower endoscopy (such as a colonoscopy or flexible sigmoidoscopy) you may notice spotting of blood in your stool or on the toilet paper. Some abdominal soreness may be present for a day or two, also.  DIET: Your first meal following the procedure should be a light meal and then it is ok to progress to your normal diet. A half-sandwich or bowl of soup is an example of a good first meal. Heavy or fried foods are harder to digest and may make you feel nauseous or bloated. Drink plenty of fluids but you should avoid alcoholic beverages for 24 hours. If you had a esophageal dilation, please see attached instructions for diet.    ACTIVITY: Your care partner should take you home directly after the procedure. You should plan to take it easy, moving slowly for the rest of the day. You can resume normal activity the day after the procedure however YOU SHOULD NOT DRIVE, use power tools, machinery or perform tasks that involve climbing or major physical exertion for 24 hours (because of the sedation medicines used during the test).   SYMPTOMS TO REPORT IMMEDIATELY: A gastroenterologist can be reached at any hour. Please call [phone]  for any of the following symptoms:   Following upper endoscopy (EGD, EUS, ERCP, esophageal dilation) Vomiting of blood or coffee ground material  New, significant abdominal pain  New, significant chest pain or pain under the shoulder blades  Painful or  persistently difficult swallowing  New shortness of breath  Black, tarry-looking or red, bloody stools  FOLLOW UP:  If any biopsies were taken you will be contacted by phone or by letter within the next 1-3 weeks. Call [phone]  if you have not heard about the biopsies in 3 weeks.  Please also call with any specific questions about appointments or follow up tests.  

## 2022-03-13 ENCOUNTER — Encounter (HOSPITAL_COMMUNITY): Payer: Self-pay | Admitting: Gastroenterology

## 2022-03-13 DIAGNOSIS — K315 Obstruction of duodenum: Secondary | ICD-10-CM | POA: Diagnosis not present

## 2022-03-13 DIAGNOSIS — E871 Hypo-osmolality and hyponatremia: Secondary | ICD-10-CM | POA: Diagnosis not present

## 2022-03-13 DIAGNOSIS — E876 Hypokalemia: Secondary | ICD-10-CM | POA: Diagnosis not present

## 2022-03-13 DIAGNOSIS — K7460 Unspecified cirrhosis of liver: Secondary | ICD-10-CM | POA: Diagnosis not present

## 2022-03-13 DIAGNOSIS — C250 Malignant neoplasm of head of pancreas: Secondary | ICD-10-CM | POA: Diagnosis not present

## 2022-03-13 DIAGNOSIS — I82890 Acute embolism and thrombosis of other specified veins: Secondary | ICD-10-CM | POA: Diagnosis not present

## 2022-03-13 LAB — CBC
HCT: 31 % — ABNORMAL LOW (ref 36.0–46.0)
Hemoglobin: 10.4 g/dL — ABNORMAL LOW (ref 12.0–15.0)
MCH: 28.6 pg (ref 26.0–34.0)
MCHC: 33.5 g/dL (ref 30.0–36.0)
MCV: 85.2 fL (ref 80.0–100.0)
Platelets: 283 10*3/uL (ref 150–400)
RBC: 3.64 MIL/uL — ABNORMAL LOW (ref 3.87–5.11)
RDW: 15.8 % — ABNORMAL HIGH (ref 11.5–15.5)
WBC: 23.2 10*3/uL — ABNORMAL HIGH (ref 4.0–10.5)
nRBC: 0 % (ref 0.0–0.2)

## 2022-03-13 LAB — COMPREHENSIVE METABOLIC PANEL
ALT: 12 U/L (ref 0–44)
AST: 12 U/L — ABNORMAL LOW (ref 15–41)
Albumin: 2.2 g/dL — ABNORMAL LOW (ref 3.5–5.0)
Alkaline Phosphatase: 72 U/L (ref 38–126)
Anion gap: 10 (ref 5–15)
BUN: <5 mg/dL — ABNORMAL LOW (ref 8–23)
CO2: 22 mmol/L (ref 22–32)
Calcium: 7.7 mg/dL — ABNORMAL LOW (ref 8.9–10.3)
Chloride: 98 mmol/L (ref 98–111)
Creatinine, Ser: 0.51 mg/dL (ref 0.44–1.00)
GFR, Estimated: >60 mL/min (ref >60)
Glucose, Bld: 134 mg/dL — ABNORMAL HIGH (ref 70–99)
Potassium: 3.4 mmol/L — ABNORMAL LOW (ref 3.5–5.1)
Sodium: 130 mmol/L — ABNORMAL LOW (ref 135–145)
Total Bilirubin: 0.9 mg/dL (ref 0.3–1.2)
Total Protein: 5.9 g/dL — ABNORMAL LOW (ref 6.5–8.1)

## 2022-03-13 LAB — HEPARIN LEVEL (UNFRACTIONATED)
Heparin Unfractionated: 0.34 IU/mL (ref 0.30–0.70)
Heparin Unfractionated: 0.23 IU/mL — ABNORMAL LOW (ref 0.30–0.70)
Heparin Unfractionated: 0.38 IU/mL (ref 0.30–0.70)

## 2022-03-13 LAB — GLUCOSE, CAPILLARY
Glucose-Capillary: 142 mg/dL — ABNORMAL HIGH (ref 70–99)
Glucose-Capillary: 130 mg/dL — ABNORMAL HIGH (ref 70–99)
Glucose-Capillary: 164 mg/dL — ABNORMAL HIGH (ref 70–99)
Glucose-Capillary: 122 mg/dL — ABNORMAL HIGH (ref 70–99)

## 2022-03-13 LAB — LIPASE, BLOOD: Lipase: 20 U/L (ref 11–51)

## 2022-03-13 LAB — PHOSPHORUS: Phosphorus: 3.5 mg/dL (ref 2.5–4.6)

## 2022-03-13 LAB — MAGNESIUM: Magnesium: 1.5 mg/dL — ABNORMAL LOW (ref 1.7–2.4)

## 2022-03-13 MED ORDER — MAGNESIUM SULFATE 2 GM/50ML IV SOLN
2.0000 g | Freq: Once | INTRAVENOUS | Status: AC
  Administered 2022-03-13: 2 g via INTRAVENOUS
  Filled 2022-03-13: qty 50

## 2022-03-13 MED ORDER — POTASSIUM CHLORIDE 20 MEQ PO PACK
40.0000 meq | PACK | ORAL | Status: AC
  Administered 2022-03-13 (×2): 40 meq via ORAL
  Filled 2022-03-13 (×2): qty 2

## 2022-03-13 MED ORDER — SODIUM CHLORIDE 0.9 % IV SOLN: INTRAVENOUS | Status: DC | PRN

## 2022-03-13 MED ORDER — HEPARIN (PORCINE) 25000 UT/250ML-% IV SOLN
1750.0000 [IU]/h | INTRAVENOUS | Status: DC
  Administered 2022-03-14 (×2): 1600 [IU]/h via INTRAVENOUS
  Administered 2022-03-15: 1750 [IU]/h via INTRAVENOUS
  Filled 2022-03-13 (×3): qty 250

## 2022-03-13 MED ORDER — ZINC OXIDE 40 % EX OINT
TOPICAL_OINTMENT | CUTANEOUS | Status: DC | PRN
Start: 2022-03-13 — End: 2022-03-24
  Filled 2022-03-13: qty 57

## 2022-03-13 MED ORDER — POTASSIUM CHLORIDE IN NACL 20-0.9 MEQ/L-% IV SOLN
INTRAVENOUS | Status: DC
  Filled 2022-03-13 (×5): qty 1000

## 2022-03-13 NOTE — Plan of Care (Signed)
  Problem: Education: Goal: Knowledge of General Education information will improve Description: Including pain rating scale, medication(s)/side effects and non-pharmacologic comfort measures Outcome: Progressing   Problem: Clinical Measurements: Goal: Diagnostic test results will improve Outcome: Progressing   Problem: Nutrition: Goal: Adequate nutrition will be maintained Outcome: Progressing   Problem: Elimination: Goal: Will not experience complications related to bowel motility Outcome: Progressing Goal: Will not experience complications related to urinary retention Outcome: Progressing

## 2022-03-13 NOTE — Progress Notes (Addendum)
[place] for IV heparin Indication: splenic/SMV thrombosis  Allergies  Allergen Reactions   Lisinopril Swelling and Other (See Comments)    Facial and upper lip    Patient Measurements: Height: '5\' 3"'$  (160 cm) Weight: 68 kg (150 lb) IBW/kg (Calculated) : 52.4 Heparin Dosing Weight: 66 kg  Vital Signs: Temp: 99.1 F (37.3 C) (07/26 0414) Temp Source: Oral (07/26 0414) BP: 107/72 (07/26 0414) Pulse Rate: 110 (07/26 0414)  Labs: Recent Labs    03/10/22 1204 03/11/22 0327 03/11/22 0327 03/12/22 0338 03/12/22 2305 03/13/22 0625  HGB  --  10.6*   < > 11.3*  --  10.4*  HCT  --  32.0*  --  32.8*  --  31.0*  PLT  --  250  --  293  --  283  APTT 84* 84*  --   --   --   --   HEPARINUNFRC  --  0.34   < > 0.19* 0.15* 0.34  CREATININE  --  0.48  --  0.44  --  0.51   < > = values in this interval not displayed.     Estimated Creatinine Clearance: 65.7 mL/min (by C-G formula based on SCr of 0.51 mg/dL).   Assessment: 6 yoF with PMH cirrhosis, HepC, recurrent pancreatic cancer on chemo, portal vein and mesenteric thrombosis (off Apixaban x 2 weeks since she was not aware to continue after start pack) admitted for electrolyte derangements. May need esophageal/duodenal stenting and Apixaban held for potential GI procedures. Pharmacy consulted to dose IV heparin in the meantime.  Baseline INR, aPTT: not done Prior anticoagulation: Apixaban 5 mg PO BID, LD 7/22 @ 0813  Significant events: 7/25: IV heparin held at 0600 for EGD/duodenal stent placement. Post-procedure, IV heparin resumed without bolus at 1700 per GI recommendations.   Today, 03/13/2022: AM heparin level = 0.34 units/mL, now therapeutic on heparin infusion at 1350 units/hr CBC: Hgb decreased to 10.4, Pltc stable/WNL SCr stable/WNL No bleeding or infusion issues reported per nursing   Goal of Therapy: Heparin level 0.3-0.7 units/ml Monitor platelets by anticoagulation  protocol: Yes  Plan: Continue heparin infusion at 1350 units/hr Heparin level in 6 hours to confirm remains within therapeutic range Daily CBC, heparin level Monitor closely for s/sx of bleeding Per GI's note, may restart DOAC if there is no evidence of bleeding 7/26 PM--will follow along for transition.    Lindell Spar, PharmD, BCPS Clinical Pharmacist  03/13/2022, 7:59 AM    Addendum: 1301 heparin level = 0.23 units/mL, now subtherapeutic Per RN, no IV site issues or interruptions in therapy RN confirms heparin running at specified rate  Plan: Increase heparin infusion to 1450 units/hr Heparin level 6 hours after rate change Daily CBC, heparin level   Lindell Spar, PharmD, BCPS Clinical Pharmacist  03/13/2022 3:23 PM

## 2022-03-13 NOTE — Progress Notes (Signed)
ANTICOAGULATION CONSULT NOTE  Pharmacy Consult for IV heparin Indication: splenic/SMV thrombosis  Allergies  Allergen Reactions   Lisinopril Swelling and Other (See Comments)    Facial and upper lip    Patient Measurements: Height: '5\' 3"'$  (160 cm) Weight: 68 kg (150 lb) IBW/kg (Calculated) : 52.4 Heparin Dosing Weight: 66 kg  Vital Signs: Temp: 98.8 F (37.1 C) (07/26 2109) Temp Source: Oral (07/26 2109) BP: 124/78 (07/26 2109) Pulse Rate: 110 (07/26 2109)  Labs: Recent Labs    03/11/22 0327 03/12/22 0338 03/12/22 2305 03/13/22 0625 03/13/22 1301 03/13/22 2136  HGB 10.6* 11.3*  --  10.4*  --   --   HCT 32.0* 32.8*  --  31.0*  --   --   PLT 250 293  --  283  --   --   APTT 84*  --   --   --   --   --   HEPARINUNFRC 0.34 0.19*   < > 0.34 0.23* 0.38  CREATININE 0.48 0.44  --  0.51  --   --    < > = values in this interval not displayed.     Estimated Creatinine Clearance: 65.7 mL/min (by C-G formula based on SCr of 0.51 mg/dL).   Assessment: 47 yoF with PMH cirrhosis, HepC, recurrent pancreatic cancer on chemo, portal vein and mesenteric thrombosis (off Apixaban x 2 weeks since she was not aware to continue after start pack) admitted for electrolyte derangements. May need esophageal/duodenal stenting and Apixaban held for potential GI procedures. Pharmacy consulted to dose IV heparin in the meantime.  Baseline INR, aPTT: not done Prior anticoagulation: Apixaban 5 mg PO BID, LD 7/22 @ 0813  Significant events: 7/25: IV heparin held at 0600 for EGD/duodenal stent placement. Post-procedure, IV heparin resumed without bolus at 1700 per GI recommendations.   Today, 03/13/2022: 2136 HL 0.38 therapeutic on 1450 units/hr No bleeding or infusion issues reported per nursing   Goal of Therapy: Heparin level 0.3-0.7 units/ml Monitor platelets by anticoagulation protocol: Yes  Plan: Continue heparin infusion at 1450 units/hr Confirmatory Heparin level with am  labs Daily CBC Monitor closely for s/sx of bleeding Per GI's note, may restart DOAC if there is no evidence of bleeding 7/26 PM--will follow along for transition.    Dolly Rias RPh 03/13/2022, 10:09 PM

## 2022-03-13 NOTE — Progress Notes (Signed)
[facility] Progress Note  CC:  Pancreatic cancer, duodenal obstruction  Subjective:  Had one episode of vomiting this AM.  No nausea currently.  Baseline pain, no worse or different than normal.  Had a couple of mushy stools today.  Objective:  Vital signs in last 24 hours: Temp:  [98.6 F (37 C)-99.1 F (37.3 C)] 99.1 F (37.3 C) (07/26 0414) Pulse Rate:  [102-126] 110 (07/26 0414) Resp:  [17-27] 18 (07/26 0414) BP: (107-164)/(69-112) 107/72 (07/26 0414) SpO2:  [86 %-100 %] 97 % (07/26 0414) Last BM Date : 03/12/22 General:  Alert, chronically ill-appearing, in NAD Heart:  Tachy; no murmurs Pulm:  CTAB.  No W/R/R. Abdomen:  Soft, non-distended.  BS present.  Non-tender. Extremities:  Without edema. Neurologic:  Alert and oriented x 4;  grossly normal neurologically. Psych:  Alert and cooperative. Normal mood and affect.  Intake/Output from previous day: 07/25 0701 - 07/26 0700 In: 1268.7 [P.O.:720; I.V.:548.7] Out: 0   Lab Results: Recent Labs    03/11/22 0327 03/12/22 0338 03/13/22 0625  WBC 13.4* 16.2* 23.2*  HGB 10.6* 11.3* 10.4*  HCT 32.0* 32.8* 31.0*  PLT 250 293 283   BMET Recent Labs    03/11/22 0327 03/12/22 0338 03/13/22 0625  NA 128* 131* 130*  K 3.9 3.8 3.4*  CL 98 98 98  CO2 '22 24 22  '$ GLUCOSE 160* 106* 134*  BUN <5* <5* <5*  CREATININE 0.48 0.44 0.51  CALCIUM 8.1* 8.1* 7.7*   LFT Recent Labs    03/13/22 0625  PROT 5.9*  ALBUMIN 2.2*  AST 12*  ALT 12  ALKPHOS 72  BILITOT 0.9   DG C-Arm 1-60 Min  Result Date: 03/12/2022 CLINICAL DATA:  Fluoroscopic assistance for placement of stent EXAM: DG C-ARM 1-60 MIN FLUOROSCOPY: Fluoroscopy Time:  90 seconds Radiation Exposure Index (if provided by the fluoroscopic device): 16.5 Number of Acquired Spot Images: 3 COMPARISON:  CT done on 03/04/2022 FINDINGS: Theophilus Kinds is noted in place. In the early images, biliary stent is noted in the course of the common bile duct. In the  final image, there is a new stent oriented transversely, possibly in pancreatic duct or bowel lumen. IMPRESSION: Fluoroscopic assistance was provided for stent placement. Electronically Signed   By: Elmer Picker M.D.   On: 03/12/2022 11:13    Assessment / Plan: # Stage IV pancreatic cancer. Initially has stage II / III disease when diagnosed in 2021. She is s/p placement of SEMS for biliary obstruction.  Had recurrence / progression in May 2023, now has stage IV disease and periportal adenopathy and SMV /splenic vein thrombosis   # Duodenal obstruction related to extrinsic compression form pancreatic cancer. Admitted with abdominal pain, nausea / vomiting.  EGD with duodenal stent placed on 7/25:  Acquired duodenal stenosis in D2/Ampullary region/D3. 22 mm x 9 cm UCSEMS placed.  We have covered the previously placed biliary stent with our new duodenal stent.   # Hyponatremia / hypomagnesemia/ hypokalemia:  -Na +130. Mg+ 1.5. K+ 3.4.   # History of hepatic abscess June 2023. 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  -Advance diet as tolerated.  Continue clear liquid diet today.  Once doing well then can advance to full liquid, then low-residue/low-fiber diet thereafter. -Will check a 2-view abdominal x-ray in the morning.   LOS: 8 days   Laban Emperor. Carrera Kiesel  03/13/2022, 9:38 AM

## 2022-03-13 NOTE — Progress Notes (Signed)
PROGRESS NOTE  Melanie Alvarez LOV:[id number] DOB: 1955-11-08   PCP: Flossie Buffy, NP  Patient is from: Home.  Lives with husband.  Independently ambulates at baseline.  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  Lab work at the GI office concerning for hyponatremia and hypokalemia, and patient was directed to ED.  Of note, patient was off Eliquis for 2 weeks.  She did not know she was supposed to continue after starter pack.  In ED, Na 122.  K2.8.  CT abdomen and pelvis showed no acute finding but stable mild intra and extrahepatic biliary ductal dilation with palliative metabolic biliary stent in place, stable pancreatic mass with occlusion of the mesenteric and splenic vein.    Electrolytes improved.  Still with intractable nausea, vomiting and abdominal pain.  Oncology and GI consulted.  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  She had duodenal stent placed on 7/25.  GI following.  Subjective: Seen and examined earlier this morning.  No major events overnight of this morning.  Continues to have nausea, vomiting and abdominal pain.  She also reports 3 episodes of diarrhea since last night.  Diarrhea is watery and loose.  She is actively vomiting during my visit.  Objective: Vitals:   03/12/22 1140 03/12/22 1226 03/12/22 2101 03/13/22 0414  BP: (!) 151/111 120/78 119/69 107/72  Pulse: (!) 109 (!) 110 (!) 110 (!) 110  Resp: (!) '22 20  18 18  '$ Temp:   99.1 F (37.3 C) 99.1 F (37.3 C)  TempSrc:   Oral Oral  SpO2: 98% 100% 100% 97%  Weight:      Height:        Examination:  GENERAL:  Nontoxic.  Actively vomiting HEENT: MMM.  Vision and hearing grossly intact.  NECK: Supple.  No apparent JVD.  RESP:  No IWOB.  Fair aeration bilaterally. CVS:  RRR. Heart sounds normal.  ABD/GI/GU: BS+. Abd soft.  Diffuse tenderness. MSK/EXT:  Moves extremities. No apparent deformity. No edema.  SKIN: no apparent skin lesion or wound NEURO: Awake and alert. Oriented appropriately.  No apparent focal neuro deficit. PSYCH: Calm. Normal affect.   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 7/25-duodenal stent placement  Microbiology summarized: None  Assessment and plan: Principal Problem:   Hyponatremia Active Problems:   Hypertension   Hypokalemia   Cirrhosis of liver (HCC)   Malignant neoplasm of pancreas (HCC)   Pancreatic insufficiency   Mesenteric vein thrombosis (HCC)   Acute thrombosis of splenic vein   Intractable nausea and vomiting   Abdominal pain, epigastric   Hypomagnesemia   Duodenal obstruction, acquired   Biliary obstruction  Intractable nausea/vomiting/abdominal pain/diarrhea: EGD with duodenitis and partial duodenal obstruction due to pancreatic mass.  CT abdomen without acute finding but chronic intra and extra biliary ductal dilation with pancreatic mass.  Underwent duodenal stent placement on 03/12/2022.  Nausea, vomiting, abdominal pain and diarrhea persisted. -  Continue IV fluid, antiemetics, PPI, Carafate and analgesics. -Continue CLD.  Start IV NS with KCl -Did not tolerate increased dose of fentanyl patch to 50 mcg although her symptoms could be from IV Dilaudid -Consulted palliative care for help with symptom management -Appreciate help by  gastroenterology and oncology.  Hyponatremia/hypokalemia/hypomagnesemia: Hyponatremia improved.  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 -Monitor and replenish as appropriate   Portal and mesenteric vein thrombosis: Recent diagnosis.  Patient thought she did not need Eliquis and stopped taking after starter pack. -Continue IV heparin for now.   Malignant neoplasm of pancreas: Followed by Dr. Marin Olp.  On systemic chemo. 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 -Continue monitoring  Controlled NIDDM-2 with hyperglycemia: A1c 5.7%.  On glipizide and metformin at home. Recent Labs  Lab 03/12/22 0739 03/12/22 1211 03/12/22 1653 03/12/22 2059 03/13/22 0742  GLUCAP 112* 111* 138* 113* 142*  -Continue SSI-sensitive  History of hepatic abscess-treated and resolved.  Status post drain removal on 6/29.  Essential hypertension: Normotensive for most part. -Continue amlodipine -Discontinued Avapro with the hope to help hyponatremia  Normocytic anemia: No report of melena or hematochezia.  Anemia panel suggests ACD Recent Labs    03/04/22 1136 03/04/22 1449 03/06/22 0838 03/07/22 0408 03/08/22 0421 03/09/22 0354 03/10/22 0325 03/11/22 0327 03/12/22 0338 03/13/22 0625  HGB 12.1 11.3* 11.2* 10.7* 10.6* 10.9* 10.5* 10.6* 11.3* 10.4*  -Continue monitoring  Mobility, has not gotten out of the bed -OOB for meals and ambulate in the hall -PT/OT  Leukocytosis: Likely demargination? -Continue monitoring  Body mass index is 26.57 kg/m.           DVT prophylaxis:  On full dose anticoagulation with IV heparin  Code Status: Full code Family Communication: None at bedside Level of care: Med-Surg Status is: Inpatient Remains inpatient appropriate because: Intractable nausea, vomiting, abdominal  pain and diarrhea   Final disposition: Home once medically cleared Consultants:  Oncology Gastroenterology Palliative medicine  Sch Meds:  Scheduled Meds:  amLODipine  10 mg Oral QHS   Chlorhexidine Gluconate Cloth  6 each Topical Daily   fentaNYL  1 patch Transdermal Q72H   insulin aspart  0-5 Units Subcutaneous QHS   insulin aspart  0-9 Units Subcutaneous TID WC   ondansetron (ZOFRAN) IV  4 mg Intravenous Q6H   pantoprazole  40 mg Oral BID   potassium chloride  40 mEq Oral Q4H   sodium chloride flush  10-40 mL Intracatheter Q12H   Continuous Infusions:  sodium chloride 10 mL/hr at 03/13/22 0859   0.9 % NaCl with KCl 20 mEq / L     heparin 1,350 Units/hr (03/13/22 0709)   PRN Meds:.sodium chloride, HYDROmorphone (DILAUDID) injection, labetalol, lip balm, loperamide, mouth rinse, prochlorperazine, sodium chloride flush  Antimicrobials: Anti-infectives (From admission, onward)    Start     Dose/Rate Route Frequency Ordered Stop   03/12/22 1100  ciprofloxacin (CIPRO) IVPB 400 mg        400 mg 200 mL/hr over 60 Minutes Intravenous  Once 03/12/22 1058 03/12/22 1226        I have personally reviewed the following labs and images: CBC: Recent Labs  Lab 03/09/22 0354 03/10/22 0325 03/11/22 0327 03/12/22 0338 03/13/22 0625  WBC 17.7* 13.5* 13.4* 16.2* 23.2*  HGB 10.9* 10.5* 10.6* 11.3* 10.4*  HCT 32.6* 31.5* 32.0* 32.8* 31.0*  MCV 86.5 86.1 86.3 84.8  85.2  PLT 232 220 250 293 283   BMP &GFR Recent Labs  Lab 03/09/22 0354 03/10/22 0325 03/11/22 0327 03/12/22 0338 03/13/22 0625  NA 130* 130* 128* 131* 130*  K 4.0 3.9 3.9 3.8 3.4*  CL 102 102 98 98 98  CO2 '23 22 22 24 22  '$ GLUCOSE 151* 102* 160* 106* 134*  BUN 6* <5* <5* <5* <5*  CREATININE 0.54 0.53 0.48 0.44 0.51  CALCIUM 8.0* 8.2* 8.1* 8.1* 7.7*  MG 1.7 1.5* 1.5* 1.9 1.5*  PHOS 3.8 3.0 2.7 3.3 3.5   Estimated Creatinine Clearance: 65.7 mL/min (by C-G formula based on SCr of 0.51 mg/dL). Liver &  Pancreas: Recent Labs  Lab 03/09/22 0354 03/10/22 0325 03/11/22 0327 03/12/22 0338 03/13/22 0625  AST 15  --  15  --  12*  ALT 14  --  14  --  12  ALKPHOS 54  --  74  --  72  BILITOT 0.9  --  0.9  --  0.9  PROT 6.2*  --  6.4*  --  5.9*  ALBUMIN 2.5* 2.3* 2.5* 2.8* 2.2*   Recent Labs  Lab 03/11/22 0327 03/12/22 0338 03/13/22 0625  LIPASE '21 20 20   '$ No results for input(s): "AMMONIA" in the last 168 hours. Diabetic: No results for input(s): "HGBA1C" in the last 72 hours.  Recent Labs  Lab 03/12/22 0739 03/12/22 1211 03/12/22 1653 03/12/22 2059 03/13/22 0742  GLUCAP 112* 111* 138* 113* 142*   Cardiac Enzymes: No results for input(s): "CKTOTAL", "CKMB", "CKMBINDEX", "TROPONINI" in the last 168 hours. No results for input(s): "PROBNP" in the last 8760 hours. Coagulation Profile: No results for input(s): "INR", "PROTIME" in the last 168 hours.  Thyroid Function Tests: No results for input(s): "TSH", "T4TOTAL", "FREET4", "T3FREE", "THYROIDAB" in the last 72 hours. Lipid Profile: No results for input(s): "CHOL", "HDL", "LDLCALC", "TRIG", "CHOLHDL", "LDLDIRECT" in the last 72 hours. Anemia Panel: No results for input(s): "VITAMINB12", "FOLATE", "FERRITIN", "TIBC", "IRON", "RETICCTPCT" in the last 72 hours.  Urine analysis:    Component Value Date/Time   COLORURINE YELLOW 02/15/2022 1059   APPEARANCEUR CLEAR 02/15/2022 1059   LABSPEC 1.013 02/15/2022 1059   PHURINE 7.0 02/15/2022 1059   GLUCOSEU NEGATIVE 02/15/2022 1059   HGBUR SMALL (A) 02/15/2022 1059   BILIRUBINUR NEGATIVE 02/15/2022 1059   BILIRUBINUR NEGATIVE 10/22/2018 1556   [place] 02/15/2022 1059   PROTEINUR NEGATIVE 02/15/2022 1059   UROBILINOGEN 0.2 10/22/2018 1556   NITRITE NEGATIVE 02/15/2022 1059   LEUKOCYTESUR SMALL (A) 02/15/2022 1059   Sepsis Labs: Invalid input(s): "PROCALCITONIN", "LACTICIDVEN"  Microbiology: No results found for this or any previous visit (from the past 240  hour(s)).  Radiology Studies: No results found.    Rayce Brahmbhatt T. [place]  If 7PM-7AM, please contact night-coverage [email or web address] 03/13/2022, 11:38 AM

## 2022-03-13 NOTE — Progress Notes (Signed)
[place] for IV heparin Indication: splenic/SMV thrombosis  Allergies  Allergen Reactions   Lisinopril Swelling and Other (See Comments)    Facial and upper lip    Patient Measurements: Height: '5\' 3"'$  (160 cm) Weight: 68 kg (150 lb) IBW/kg (Calculated) : 52.4 Heparin Dosing Weight: 66 kg  Vital Signs: Temp: 99.1 F (37.3 C) (07/25 2101) Temp Source: Oral (07/25 2101) BP: 119/69 (07/25 2101) Pulse Rate: 110 (07/25 2101)  Labs: Recent Labs    03/10/22 0325 03/10/22 1204 03/11/22 0327 03/12/22 0338 03/12/22 2305  HGB 10.5*  --  10.6* 11.3*  --   HCT 31.5*  --  32.0* 32.8*  --   PLT 220  --  250 293  --   APTT 84* 84* 84*  --   --   HEPARINUNFRC >1.10*  --  0.34 0.19* 0.15*  CREATININE 0.53  --  0.48 0.44  --      Estimated Creatinine Clearance: 65.7 mL/min (by C-G formula based on SCr of 0.44 mg/dL).   Assessment: 36 yoF with PMH cirrhosis, HepC, recurrent pancreatic cancer on chemo, portal vein and mesenteric thrombosis (off Apixaban x 2 weeks since she was not aware to continue after start pack) admitted for electrolyte derangements. May need esophageal/duodenal stenting and Apixaban held for potential GI procedures. Pharmacy consulted to dose IV heparin in the meantime.  Baseline INR, aPTT: not done Prior anticoagulation: Apixaban 5 mg PO BID, LD 7/22 @ 0813  Today, 03/13/2022: IV heparin held at 0600 for EGD/duodenal stent placement. Post-procedure, resume IV heparin without bolus at 5pm today per GI recommendations.  CBC: Hgb 11.3, low but improved; Pltc stable/WNL SCr stable/WNL 2305 HL 0.15 subtherapeutic on 1150 units/hr No bleeding or interruptions per RN  Goal of Therapy: Heparin level 0.3-0.7 units/ml Monitor platelets by anticoagulation protocol: Yes  Plan: Increase heparin drip to 1350 units/hr Heparin level in 6 hours  Daily CBC, heparin level Monitor closely for s/sx of bleeding Per GI's note, may  restart DOAC if there is no evidence of bleeding in 24 hours (PM of 7/26)--will follow along for transition.   Dolly Rias RPh 03/13/2022, 12:07 AM

## 2022-03-14 ENCOUNTER — Inpatient Hospital Stay (HOSPITAL_COMMUNITY): Payer: BC Managed Care – PPO

## 2022-03-14 ENCOUNTER — Telehealth: Payer: Self-pay | Admitting: *Deleted

## 2022-03-14 DIAGNOSIS — K7460 Unspecified cirrhosis of liver: Secondary | ICD-10-CM | POA: Diagnosis not present

## 2022-03-14 DIAGNOSIS — E876 Hypokalemia: Secondary | ICD-10-CM | POA: Diagnosis not present

## 2022-03-14 DIAGNOSIS — E871 Hypo-osmolality and hyponatremia: Secondary | ICD-10-CM | POA: Diagnosis not present

## 2022-03-14 DIAGNOSIS — I82890 Acute embolism and thrombosis of other specified veins: Secondary | ICD-10-CM | POA: Diagnosis not present

## 2022-03-14 DIAGNOSIS — K315 Obstruction of duodenum: Secondary | ICD-10-CM | POA: Diagnosis not present

## 2022-03-14 DIAGNOSIS — C250 Malignant neoplasm of head of pancreas: Secondary | ICD-10-CM | POA: Diagnosis not present

## 2022-03-14 LAB — CBC
HCT: 28.8 % — ABNORMAL LOW (ref 36.0–46.0)
Hemoglobin: 9.5 g/dL — ABNORMAL LOW (ref 12.0–15.0)
MCH: 28.5 pg (ref 26.0–34.0)
MCHC: 33 g/dL (ref 30.0–36.0)
MCV: 86.5 fL (ref 80.0–100.0)
Platelets: 259 10*3/uL (ref 150–400)
RBC: 3.33 MIL/uL — ABNORMAL LOW (ref 3.87–5.11)
RDW: 16 % — ABNORMAL HIGH (ref 11.5–15.5)
WBC: 22.9 10*3/uL — ABNORMAL HIGH (ref 4.0–10.5)
nRBC: 0 % (ref 0.0–0.2)

## 2022-03-14 LAB — GLUCOSE, CAPILLARY
Glucose-Capillary: 125 mg/dL — ABNORMAL HIGH (ref 70–99)
Glucose-Capillary: 126 mg/dL — ABNORMAL HIGH (ref 70–99)
Glucose-Capillary: 92 mg/dL (ref 70–99)
Glucose-Capillary: 128 mg/dL — ABNORMAL HIGH (ref 70–99)

## 2022-03-14 LAB — HEPARIN LEVEL (UNFRACTIONATED)
Heparin Unfractionated: 0.26 IU/mL — ABNORMAL LOW (ref 0.30–0.70)
Heparin Unfractionated: 0.38 IU/mL (ref 0.30–0.70)
Heparin Unfractionated: 0.33 IU/mL (ref 0.30–0.70)

## 2022-03-14 LAB — RENAL FUNCTION PANEL
Albumin: 2 g/dL — ABNORMAL LOW (ref 3.5–5.0)
Anion gap: 6 (ref 5–15)
BUN: <5 mg/dL — ABNORMAL LOW (ref 8–23)
CO2: 22 mmol/L (ref 22–32)
Calcium: 7.4 mg/dL — ABNORMAL LOW (ref 8.9–10.3)
Chloride: 100 mmol/L (ref 98–111)
Creatinine, Ser: 0.47 mg/dL (ref 0.44–1.00)
GFR, Estimated: >60 mL/min (ref >60)
Glucose, Bld: 125 mg/dL — ABNORMAL HIGH (ref 70–99)
Phosphorus: 2.2 mg/dL — ABNORMAL LOW (ref 2.5–4.6)
Potassium: 4.2 mmol/L (ref 3.5–5.1)
Sodium: 128 mmol/L — ABNORMAL LOW (ref 135–145)

## 2022-03-14 LAB — MAGNESIUM: Magnesium: 1.7 mg/dL (ref 1.7–2.4)

## 2022-03-14 MED ORDER — HYDROMORPHONE HCL 1 MG/ML IJ SOLN
0.5000 mg | INTRAMUSCULAR | Status: DC | PRN
  Administered 2022-03-14 – 2022-03-15 (×4): 0.5 mg via INTRAVENOUS
  Filled 2022-03-14 (×4): qty 0.5

## 2022-03-14 MED ORDER — HYDROMORPHONE HCL 2 MG PO TABS
2.0000 mg | ORAL_TABLET | ORAL | Status: DC | PRN
  Administered 2022-03-14: 2 mg via ORAL
  Administered 2022-03-15: 1 mg via ORAL
  Filled 2022-03-14 (×2): qty 1

## 2022-03-14 NOTE — Progress Notes (Signed)
ANTICOAGULATION CONSULT NOTE  Pharmacy Consult for IV heparin Indication: splenic/SMV thrombosis  Allergies  Allergen Reactions   Lisinopril Swelling and Other (See Comments)    Facial and upper lip    Patient Measurements: Height: '5\' 3"'$  (160 cm) Weight: 68 kg (150 lb) IBW/kg (Calculated) : 52.4 Heparin Dosing Weight: 66 kg  Vital Signs: Temp: 98.8 F (37.1 C) (07/26 2109) Temp Source: Oral (07/26 2109) BP: 124/78 (07/26 2109) Pulse Rate: 110 (07/26 2109)  Labs: Recent Labs    03/12/22 0338 03/12/22 2305 03/13/22 0625 03/13/22 1301 03/13/22 2136 03/14/22 0330  HGB 11.3*  --  10.4*  --   --  9.5*  HCT 32.8*  --  31.0*  --   --  28.8*  PLT 293  --  283  --   --  259  HEPARINUNFRC 0.19*   < > 0.34 0.23* 0.38 0.26*  CREATININE 0.44  --  0.51  --   --   --    < > = values in this interval not displayed.     Estimated Creatinine Clearance: 65.7 mL/min (by C-G formula based on SCr of 0.51 mg/dL).   Assessment: 69 yoF with PMH cirrhosis, HepC, recurrent pancreatic cancer on chemo, portal vein and mesenteric thrombosis (off Apixaban x 2 weeks since she was not aware to continue after start pack) admitted for electrolyte derangements. May need esophageal/duodenal stenting and Apixaban held for potential GI procedures. Pharmacy consulted to dose IV heparin in the meantime.  Baseline INR, aPTT: not done Prior anticoagulation: Apixaban 5 mg PO BID, LD 7/22 @ 0813  Significant events: 7/25: IV heparin held at 0600 for EGD/duodenal stent placement. Post-procedure, IV heparin resumed without bolus at 1700 per GI recommendations.   Today, 03/14/2022: HL 0.26 sub- therapeutic on 1450 units/hr Hgb down to 9.5, plts WNL No bleeding or infusion issues noted  Goal of Therapy: Heparin level 0.3-0.7 units/ml Monitor platelets by anticoagulation protocol: Yes  Plan: Increase heparin to 1600 units/hr Heparin level in 6 hours Daily CBC Monitor closely for s/sx of bleeding Per  GI's note, may restart DOAC if there is no evidence of bleeding 7/26 PM--will follow along for transition.    Dolly Rias RPh 03/14/2022, 4:08 AM

## 2022-03-14 NOTE — Progress Notes (Signed)
ANTICOAGULATION CONSULT NOTE  Pharmacy Consult for IV heparin Indication: splenic/SMV thrombosis on apixaban PTA  Allergies  Allergen Reactions   Lisinopril Swelling and Other (See Comments)    Facial and upper lip    Patient Measurements: Height: '5\' 3"'$  (160 cm) Weight: 68 kg (150 lb) IBW/kg (Calculated) : 52.4 Heparin Dosing Weight: 66 kg  Vital Signs: Temp: 98.5 F (36.9 C) (07/27 0449) Temp Source: Oral (07/27 0449) BP: 117/75 (07/27 0449) Pulse Rate: 106 (07/27 0449)  Labs: Recent Labs    03/12/22 0338 03/12/22 2305 03/13/22 0625 03/13/22 1301 03/13/22 2136 03/14/22 0330 03/14/22 1028  HGB 11.3*  --  10.4*  --   --  9.5*  --   HCT 32.8*  --  31.0*  --   --  28.8*  --   PLT 293  --  283  --   --  259  --   HEPARINUNFRC 0.19*   < > 0.34   < > 0.38 0.26* 0.38  CREATININE 0.44  --  0.51  --   --  0.47  --    < > = values in this interval not displayed.     Estimated Creatinine Clearance: 65.7 mL/min (by C-G formula based on SCr of 0.47 mg/dL).   Assessment: 29 yoF with PMH cirrhosis, HepC, recurrent pancreatic cancer on chemo, portal vein and mesenteric thrombosis (off Apixaban x 2 weeks since she was not aware to continue after start pack) admitted for electrolyte derangements. May need esophageal/duodenal stenting and Apixaban held for potential GI procedures. Pharmacy consulted to dose IV heparin in the meantime.  Baseline INR, aPTT: not done Prior anticoagulation: Apixaban 5 mg PO BID, LD 7/22 @ 0813  Significant events: 7/25: IV heparin held at 0600 for EGD/duodenal stent placement. Post-procedure, IV heparin resumed without bolus at 1700 per GI recommendations.   Today, 03/14/2022: HL now therapeutic on current IV heparin rate of 1600 units/hr Hgb down to 9.5, plts WNL No bleeding or infusion issues noted  Goal of Therapy: Heparin level 0.3-0.7 units/ml Monitor platelets by anticoagulation protocol: Yes  Plan: Continue IV heparin at current rate  of 1600 units/hr Recheck heparin level in 6 hours Daily CBC Monitor closely for s/sx of bleeding Per GI's note, may restart DOAC if there is no evidence of bleeding 7/26 PM--will follow along for transition.    Adrian Saran, PharmD, BCPS Secure Chat if ?s or find phone # per room assignments 03/14/2022 11:48 AM

## 2022-03-14 NOTE — Consult Note (Signed)
Consultation Note Date: 03/14/2022   Patient Name: Melanie Alvarez  DOB: [birth year]  MRN: [id number]  Age / Sex: 66 y.o., female  PCP: Nche, Charlene Brooke, NP Referring Physician: Mercy Riding, MD  Reason for Consultation: Establishing goals of care  HPI/Patient Profile: 66 y.o. female admitted on 03/04/2022   Clinical Assessment and Goals of Care: 66 year old lady was a patient of Dr. Marin Olp for recurrent pancreatic cancer, history of liver cirrhosis untreated hep C, portal vein mesenteric vein thrombosis.  Patient presented to her gastroenterologist with nausea vomiting abdominal pain.  Patient was directed to the emergency department.  CT abdomen and pelvis showed stable mild intra and extrahepatic biliary ductal dilation.  Palliative biliary stent in place, stable pancreatic mass with occlusion of mesenteric and splenic vein.  Patient admitted to hospital medicine service, GI colleagues following.  On 7-25, duodenal stent was placed. Palliative medicine consult has been requested. Patient is awake alert resting in bed.  I introduced myself and palliative care as follows: Palliative medicine is specialized medical care for people living with serious illness. It focuses on providing relief from the symptoms and stress of a serious illness. The goal is to improve quality of life for both the patient and the family. Goals of care: Broad aims of medical therapy in relation to the patient's values and preferences. Our aim is to provide medical care aimed at enabling patients to achieve the goals that matter most to them, given the circumstances of their particular medical situation and their constraints.  She states that she does not have any concerns that need to be discussed with palliative care.  She does not wish to discuss further.  She states that her pain is well controlled.  She states that Dr. Marin Olp  rounds on her every morning on a daily basis.  I did let her know about availability of palliative care at [facility].  NEXT OF KIN Lives at home with her husband  SUMMARY OF RECOMMENDATIONS   Full code/full scope See pain management recommendations listed below. Continue current mode of care. Recommend outpatient palliative care and/or palliative care at [facility].  Code Status/Advance Care Planning: Full code   Symptom Management:  Transdermal fentanyl, patient is also supposed to be on oral Dilaudid as per Dr. Antonieta Pert last oncology note, also discussed with patient and she states that she takes Dilaudid pills at home for pain.  Use IV Dilaudid as last resort.  Palliative Prophylaxis:  Frequent Pain Assessment  Additional Recommendations (Limitations, Scope, Preferences): Full Scope Treatment  Psycho-social/Spiritual:  Desire for further Chaplaincy support:yes Additional Recommendations: Caregiving  Support/Resources  Prognosis:  Unable to determine  Discharge Planning: To Be Determined      Primary Diagnoses: Present on Admission:  Hyponatremia  Hypertension  Hypokalemia  Cirrhosis of liver (HCC)  Malignant neoplasm of pancreas (HCC)  Pancreatic insufficiency  Intractable nausea and vomiting   I have reviewed the medical record, interviewed the patient and family, and examined the patient. The following aspects  are pertinent.  Past Medical History:  Diagnosis Date   Cancer ([place])    Cirrhosis ([place])    Diabetes mellitus 2012   Diverticulosis    Gallbladder sludge    Hepatitis 2010   history of Hepatitis C   Hepatitis C    Hypertension 2011   Obesity    Social History   Socioeconomic History   Marital status: Married    Spouse name: Not on file   Number of children: Not on file   Years of education: Not on file   Highest education level: Not on file  Occupational History   Not on file  Tobacco Use   Smoking  status: Former    Packs/day: 0.25    Types: Cigarettes    Quit date: 09/20/2019    Years since quitting: 2.4   Smokeless tobacco: Never  Vaping Use   Vaping Use: Never used  Substance and Sexual Activity   Alcohol use: Not Currently    Comment: quit 2022   Drug use: No    Comment: previously    Sexual activity: Yes    Partners: Male    Birth control/protection: Post-menopausal  Other Topics Concern   Not on file  Social History Narrative   Previous [facility] pt.  Last MD was Dr. Delman Cheadle, seen 12/2011      Works part time in [employer] with husband   Social Determinants of Radio broadcast assistant Strain: Not on [employer] Insecurity: Not on file  Transportation Needs: Not on file  Physical Activity: Not on file  Stress: Not on file  Social Connections: Not on file   Family History  Problem Relation Age of Onset   Diabetes Sister    Cancer Maternal Grandmother        leukemia    Diabetes Maternal Grandfather    Diabetes Paternal Grandfather    Diabetes Paternal Grandmother    Hypertension Sister    Hypertension Brother    Anesthesia problems Neg Hx    Scheduled Meds:  amLODipine  10 mg Oral QHS   Chlorhexidine Gluconate Cloth  6 each Topical Daily   fentaNYL  1 patch Transdermal Q72H   insulin aspart  0-5 Units Subcutaneous QHS   insulin aspart  0-9 Units Subcutaneous TID WC   ondansetron (ZOFRAN) IV  4 mg Intravenous Q6H   pantoprazole  40 mg Oral BID   sodium chloride flush  10-40 mL Intracatheter Q12H   Continuous Infusions:  sodium chloride 10 mL/hr at 03/13/22 0859   0.9 % NaCl with KCl 20 mEq / L 100 mL/hr at 03/14/22 1056   heparin 1,600 Units/hr (03/14/22 0443)   PRN Meds:.sodium chloride, HYDROmorphone (DILAUDID) injection, labetalol, lip balm, liver oil-zinc oxide, loperamide, mouth rinse, prochlorperazine, sodium chloride flush Medications Prior to Admission:  Prior to Admission medications   Medication Sig  Start Date End Date Taking? Authorizing Provider  amLODipine (NORVASC) 10 MG tablet Take 1 tablet (10 mg total) by mouth at bedtime. 11/27/21  Yes Nche, Charlene Brooke, NP  apixaban (ELIQUIS) 5 MG TABS tablet Take 1 tablet (5 mg total) by mouth 2 (two) times daily. 03/04/22  Yes Vicie Mutters R, PA-C  atorvastatin (LIPITOR) 20 MG tablet Take 1 tablet (20 mg total) by mouth at bedtime. 01/25/21  Yes Nche, Charlene Brooke, NP  CREON 36000-114000 units CPEP capsule TAKE 2 CAPSULES BY MOUTH THREE TIMES DAILY BEFORE MEAL(S) Patient taking differently: Take 72,000  Units by mouth 3 (three) times daily before meals. 02/01/22  Yes Volanda Napoleon, MD  glipiZIDE (GLUCOTROL XL) 5 MG 24 hr tablet Take 1 tablet (5 mg total) by mouth daily with breakfast. 09/14/21  Yes Nche, Charlene Brooke, NP  HYDROmorphone (DILAUDID) 4 MG tablet Take 1 tablet (4 mg total) by mouth every 4 (four) hours as needed for severe pain. 03/04/22  Yes Ennever, Rudell Cobb, MD  olmesartan (BENICAR) 5 MG tablet Take 1 tablet by mouth once daily 02/11/22  Yes Nche, Charlene Brooke, NP  ALPRAZolam (XANAX) 1 MG tablet Take 0.5 tablets (0.5 mg total) by mouth every 8 (eight) hours as needed for anxiety. 02/11/22   Volanda Napoleon, MD  fentaNYL (DURAGESIC) 25 MCG/HR Place 1 patch onto the skin every 3 (three) days. Patient not taking: Reported on 03/04/2022 01/02/22   Volanda Napoleon, MD  metFORMIN (GLUCOPHAGE XR) 750 MG 24 hr tablet Take 1 tablet (750 mg total) by mouth daily with breakfast. Patient not taking: Reported on 03/04/2022 09/14/21   Nche, Charlene Brooke, NP  metoprolol succinate (TOPROL-XL) 25 MG 24 hr tablet TAKE 1 TABLET BY MOUTH ONCE DAILY Patient not taking: Reported on 03/04/2022 01/25/21   Nche, Charlene Brooke, NP  ondansetron (ZOFRAN) 8 MG tablet Take 1 tablet (8 mg total) by mouth 2 (two) times daily as needed (Nausea or vomiting). 03/04/22   Vladimir Crofts, PA-C  pantoprazole (PROTONIX) 40 MG tablet Take 1 tablet (40 mg total) by mouth 2 (two)  times daily for 14 days. Patient not taking: Reported on 01/15/2022 12/04/21 12/18/21  Flossie Buffy, NP  pantoprazole (PROTONIX) 40 MG tablet Take 1 tablet (40 mg total) by mouth daily. 12/24/21   Volanda Napoleon, MD  potassium chloride (KLOR-CON M) 20 MEQ tablet Take 1 tablet (20 mEq total) by mouth 2 (two) times daily. Patient not taking: Reported on 01/15/2022 11/27/21   Nche, Charlene Brooke, NP  prochlorperazine (COMPAZINE) 10 MG tablet Take 1 tablet (10 mg total) by mouth every 6 (six) hours as needed (Nausea or vomiting). Patient not taking: Reported on 03/04/2022 12/26/21   Volanda Napoleon, MD  sucralfate (CARAFATE) 1 g tablet Take 1 tablet (1 g total) by mouth 2 (two) times daily. 03/04/22   Vladimir Crofts, PA-C   Allergies  Allergen Reactions   Lisinopril Swelling and Other (See Comments)    Facial and upper lip   Review of Systems Denies pain currently Physical Exam Awake alert oriented Sitting up in bed Has Port-A-Cath Mild abdominal discomfort  Vital Signs: BP 117/77 (BP Location: Left Arm)   Pulse 100   Temp 98.1 F (36.7 C) (Oral)   Resp 14   Ht '5\' 3"'$  (1.6 m)   Wt 68 kg   SpO2 98%   BMI 26.57 kg/m  Pain Scale: 0-10 POSS *See Group Information*: 1-Acceptable,Awake and alert Pain Score: 6    SpO2: SpO2: 98 % O2 Device:SpO2: 98 % O2 Flow Rate: .O2 Flow Rate (L/min): 2 L/min  IO: Intake/output summary:  Intake/Output Summary (Last 24 hours) at 03/14/2022 1310 Last data filed at 03/13/2022 1930 Gross per 24 hour  Intake 1111.9 ml  Output --  Net 1111.9 ml    LBM: Last BM Date : 03/13/22 Baseline Weight: Weight: 68 kg Most recent weight: Weight: 68 kg     Palliative Assessment/Data:   PPS 50%  Time In:  12 Time Out:  1300 Time Total:  60  Greater than 50%  of this  time was spent counseling and coordinating care related to the above assessment and plan.  Signed by: Loistine Chance, MD   Please contact Palliative Medicine Team phone at [phone] for  questions and concerns.  For individual provider: See Shea Evans

## 2022-03-14 NOTE — Progress Notes (Signed)
PROGRESS NOTE  Melanie Alvarez IZT:[id number] DOB: 04/18/1956   PCP: Flossie Buffy, NP  Patient is from: Home.  Lives with husband.  Independently ambulates at baseline.  DOA: 03/04/2022 LOS: 9  Chief complaints Chief Complaint  Patient presents with   Abnormal Labs     Brief Narrative / Interim history: 66 year old F with PMH of liver cirrhosis, treated hep C, recurrent pancreatic cancer with insufficiency on systemic chemo, portal vein and mesenteric vein thrombosis on Eliquis, history of hepatic abscess, DM-2, HTN and HLD presented to her gastroenterologist with nausea, vomiting and abdominal pain.  Lab work at the GI office concerning for hyponatremia and hypokalemia, and patient was directed to ED.  Of note, patient was off Eliquis for 2 weeks.  She did not know she was supposed to continue after starter pack.  In ED, Na 122.  K2.8.  CT abdomen and pelvis showed no acute finding but stable mild intra and extrahepatic biliary ductal dilation with palliative metabolic biliary stent in place, stable pancreatic mass with occlusion of the mesenteric and splenic vein.    Electrolytes improved.  Still with intractable nausea, vomiting and abdominal pain.  Oncology and GI consulted.  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  She had duodenal stent placed on 7/25.  GI following.  Subjective: Seen and examined earlier this morning.  No major events overnight of this morning.  Reports improvement in her pain.  No nausea, vomiting or diarrhea last night and this morning.  She seems to be in a very good spirits today.  Objective: Vitals:   03/13/22 1200 03/13/22 1730 03/13/22 2109 03/14/22 0449  BP: 125/77  124/78 117/75  Pulse: (!) 108  (!) 110 (!) 106  Resp: '18  18 18  '$ Temp: 99.8 F (37.7 C) 99.3 F (37.4 C) 98.8 F (37.1 C)  98.5 F (36.9 C)  TempSrc: Oral Oral Oral Oral  SpO2: 97%  98% 94%  Weight:      Height:        Examination:  GENERAL: No apparent distress.  Nontoxic. HEENT: MMM.  Vision and hearing grossly intact.  NECK: Supple.  No apparent JVD.  RESP:  No IWOB.  Fair aeration bilaterally. CVS:  RRR. Heart sounds normal.  ABD/GI/GU: BS+. Abd soft.  Mild diffuse tenderness. MSK/EXT:  Moves extremities. No apparent deformity. No edema.  SKIN: no apparent skin lesion or wound NEURO: Awake and alert. Oriented appropriately.  No apparent focal neuro deficit. PSYCH: Calm. Normal affect.   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 7/25-duodenal stent placement  Microbiology summarized: None  Assessment and plan: Principal Problem:   Hyponatremia Active Problems:   Hypertension   Hypokalemia   Cirrhosis of liver (HCC)   Malignant neoplasm of pancreas (HCC)   Pancreatic insufficiency   Mesenteric vein thrombosis (HCC)   Acute thrombosis of splenic vein   Intractable nausea and vomiting   Abdominal pain, epigastric   Hypomagnesemia   Duodenal obstruction, acquired   Biliary obstruction  Intractable nausea/vomiting/abdominal pain/diarrhea: EGD with duodenitis and partial duodenal obstruction due to pancreatic mass.  CT abdomen without acute finding but chronic intra and extra biliary ductal dilation with pancreatic mass.  Underwent duodenal stent placement on 03/12/2022.  GI symptoms improved -Continue IV fluid, antiemetics, PPI, Carafate  and analgesics. -Advance to FLD by GI.  Continue IVF for now -Did not tolerate increased dose of fentanyl patch to 50 mcg although her symptoms could be from IV Dilaudid -Consulted palliative care for help with symptom management -Appreciate help by GI and  oncology.  Hyponatremia/hypokalemia/hypomagnesemia: Hyponatremia improved.  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 -Monitor and replenish as appropriate   Portal and mesenteric vein thrombosis: Recent diagnosis.  Patient thought she did not need Eliquis and stopped taking after starter pack. -Continue IV heparin until she tolerates diet advancement   Malignant neoplasm of pancreas: Followed by Dr. Marin Olp.  On systemic chemo. 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 -Continue monitoring  Controlled NIDDM-2 with hyperglycemia: A1c 5.7%.  On glipizide and metformin at home. Recent Labs  Lab 03/13/22 1158 03/13/22 1651 03/13/22 2107 03/14/22 0749 03/14/22 1134  GLUCAP 130* 164* 122* 125* 126*  -Continue SSI-sensitive  History of hepatic abscess-treated and resolved.  Status post drain removal on 6/29.  Essential hypertension: Normotensive for most part. -Continue amlodipine -Discontinued Avapro with the hope to help hyponatremia  Normocytic anemia: No report of melena or hematochezia.  Anemia panel suggests ACD Recent Labs    03/04/22 1449 03/06/22 0838 03/07/22 0408 03/08/22 0421 03/09/22 0354 03/10/22 0325 03/11/22 0327 03/12/22 0338 03/13/22 0625 03/14/22 0330  HGB 11.3* 11.2* 10.7* 10.6* 10.9* 10.5* 10.6* 11.3* 10.4* 9.5*  -Continue monitoring  Mobility, has not gotten out of the bed -OOB for meals and ambulate in the hall -PT/OT  Leukocytosis: Likely demargination and malignancy versus infection. -Continue monitoring  Body mass index is 26.57 kg/m.           DVT prophylaxis:  On full dose anticoagulation with IV heparin  Code Status: Full code Family Communication: None at bedside Level of care: Med-Surg Status is: Inpatient Remains inpatient appropriate because:  Intractable nausea, vomiting, abdominal pain and diarrhea   Final disposition: Home once medically cleared Consultants:  Oncology Gastroenterology Palliative medicine  Sch Meds:  Scheduled Meds:  amLODipine  10 mg Oral QHS   Chlorhexidine Gluconate Cloth  6 each Topical Daily   fentaNYL  1 patch Transdermal Q72H   insulin aspart  0-5 Units Subcutaneous QHS   insulin aspart  0-9 Units Subcutaneous TID WC   ondansetron (ZOFRAN) IV  4 mg Intravenous Q6H   pantoprazole  40 mg Oral BID   sodium chloride flush  10-40 mL Intracatheter Q12H   Continuous Infusions:  sodium chloride 10 mL/hr at 03/13/22 0859   0.9 % NaCl with KCl 20 mEq / L 100 mL/hr at 03/14/22 1056   heparin 1,600 Units/hr (03/14/22 0443)   PRN Meds:.sodium chloride, HYDROmorphone (DILAUDID) injection, labetalol, lip balm, liver oil-zinc oxide, loperamide, mouth rinse, prochlorperazine, sodium chloride flush  Antimicrobials: Anti-infectives (From admission, onward)    Start     Dose/Rate Route Frequency Ordered Stop   03/12/22 1100  ciprofloxacin (CIPRO) IVPB 400 mg        400 mg 200 mL/hr over 60 Minutes Intravenous  Once 03/12/22 1058 03/12/22 1226        I have personally reviewed the following labs and images: CBC: Recent Labs  Lab 03/10/22 0325 03/11/22 0327 03/12/22 0338 03/13/22 0625 03/14/22 0330  WBC 13.5* 13.4* 16.2* 23.2* 22.9*  HGB 10.5* 10.6* 11.3* 10.4* 9.5*  HCT 31.5* 32.0* 32.8* 31.0* 28.8*  MCV 86.1 86.3 84.8 85.2  86.5  PLT 220 250 293 283 259   BMP &GFR Recent Labs  Lab 03/10/22 0325 03/11/22 0327 03/12/22 0338 03/13/22 0625 03/14/22 0330  NA 130* 128* 131* 130* 128*  K 3.9 3.9 3.8 3.4* 4.2  CL 102 98 98 98 100  CO2 '22 22 24 22 22  '$ GLUCOSE 102* 160* 106* 134* 125*  BUN <5* <5* <5* <5* <5*  CREATININE 0.53 0.48 0.44 0.51 0.47  CALCIUM 8.2* 8.1* 8.1* 7.7* 7.4*  MG 1.5* 1.5* 1.9 1.5* 1.7  PHOS 3.0 2.7 3.3 3.5 2.2*   Estimated Creatinine Clearance: 65.7 mL/min (by C-G  formula based on SCr of 0.47 mg/dL). Liver & Pancreas: Recent Labs  Lab 03/09/22 0354 03/10/22 0325 03/11/22 0327 03/12/22 0338 03/13/22 0625 03/14/22 0330  AST 15  --  15  --  12*  --   ALT 14  --  14  --  12  --   ALKPHOS 54  --  74  --  72  --   BILITOT 0.9  --  0.9  --  0.9  --   PROT 6.2*  --  6.4*  --  5.9*  --   ALBUMIN 2.5* 2.3* 2.5* 2.8* 2.2* 2.0*   Recent Labs  Lab 03/11/22 0327 03/12/22 0338 03/13/22 0625  LIPASE '21 20 20   '$ No results for input(s): "AMMONIA" in the last 168 hours. Diabetic: No results for input(s): "HGBA1C" in the last 72 hours.  Recent Labs  Lab 03/13/22 1158 03/13/22 1651 03/13/22 2107 03/14/22 0749 03/14/22 1134  GLUCAP 130* 164* 122* 125* 126*   Cardiac Enzymes: No results for input(s): "CKTOTAL", "CKMB", "CKMBINDEX", "TROPONINI" in the last 168 hours. No results for input(s): "PROBNP" in the last 8760 hours. Coagulation Profile: No results for input(s): "INR", "PROTIME" in the last 168 hours.  Thyroid Function Tests: No results for input(s): "TSH", "T4TOTAL", "FREET4", "T3FREE", "THYROIDAB" in the last 72 hours. Lipid Profile: No results for input(s): "CHOL", "HDL", "LDLCALC", "TRIG", "CHOLHDL", "LDLDIRECT" in the last 72 hours. Anemia Panel: No results for input(s): "VITAMINB12", "FOLATE", "FERRITIN", "TIBC", "IRON", "RETICCTPCT" in the last 72 hours.  Urine analysis:    Component Value Date/Time   COLORURINE YELLOW 02/15/2022 1059   APPEARANCEUR CLEAR 02/15/2022 1059   LABSPEC 1.013 02/15/2022 1059   PHURINE 7.0 02/15/2022 1059   GLUCOSEU NEGATIVE 02/15/2022 1059   HGBUR SMALL (A) 02/15/2022 1059   BILIRUBINUR NEGATIVE 02/15/2022 1059   BILIRUBINUR NEGATIVE 10/22/2018 1556   [place] 02/15/2022 1059   PROTEINUR NEGATIVE 02/15/2022 1059   UROBILINOGEN 0.2 10/22/2018 1556   NITRITE NEGATIVE 02/15/2022 1059   LEUKOCYTESUR SMALL (A) 02/15/2022 1059   Sepsis Labs: Invalid input(s): "PROCALCITONIN",  "LACTICIDVEN"  Microbiology: No results found for this or any previous visit (from the past 240 hour(s)).  Radiology Studies: DG Abd 2 Views  Result Date: 03/14/2022 CLINICAL DATA:  Duodenal stent placement EXAM: ABDOMEN - 2 VIEW COMPARISON:  CT abdomen and pelvis 03/04/2022 FINDINGS: Interval placement of a duodenal stent. The stent projects over the expected course of the duodenal sweep. Redemonstrated biliary stent. Nonobstructive bowel-gas pattern. Tubular marker near the GE junction. Large phleboliths versus fibroid pelvis. IMPRESSION: Interval placement of duodenal stent in expected position. Electronically Signed   By: Placido Sou M.D.   On: 03/14/2022 08:34      Tylisha Danis T. [place]  If 7PM-7AM, please contact night-coverage [email or web address] 03/14/2022, 12:45 PM

## 2022-03-14 NOTE — Progress Notes (Signed)
Pharmacy: [facility]  Patient is a 66 y.o with recurrent pancreatic cancer and portal vein and mesenteric thrombosis who is currently on heparin drip while home Eliquis is on hold.  - s/p EGD and duodenal stent placement on 03/12/22  Today, 03/14/2022: - confirmatory heparin level collected a 6:30p is therapeutic at  0.33 - cbc somewhat stable - no bleeding documented  Plan: - continue heparin drip at 1600 units/hr - daily heparin level - monitor for s/sx bleeding  Dia Sitter, PharmD, BCPS 03/14/2022 7:15 PM

## 2022-03-14 NOTE — Telephone Encounter (Signed)
Per scheduling message [place] and lvm for a call back to schedule appointment for next week.

## 2022-03-14 NOTE — Progress Notes (Signed)
Daily Progress Note  Hospital Day: 11  Chief Complaint: pancreatic cancer, duodenal obstruction   Brief History Melanie Alvarez is a 66 y.o. female with a pmh not limited to metastatic pancreatic cancer, SMA and SV occlusion admitted with nausea, vomiting and abdominal pain. See our 7/19 consult note for details  Assessment / Plan   # Stage IV pancreatic cancer. Initially has stage II / III disease when diagnosed in 2021. She is s/p placement of SEMS for biliary obstruction.  Had recurrence / progression in May 2023, now has stage IV disease and periportal adenopathy and SMV /splenic vein thrombosis requiring anticoagulation. Cancer treatment currently on hold  # SMV / SV thrombosis, on IV heparin  # Duodenal obstruction related to extrinsic compression from pancreatic cancer s/p EGD with placement of duodenal stent on 7/25. There is a chance that the enteral stent could jail off the previously placed biliary stent making future ERCP difficult or impossible.  Abdominal films this am unremarkable. Duodenal stent in expected position.  Tolerating CK diet without vomiting / pain. Will advance to full liquids  # Leukocytosis. WBC stable but still quite elevated at 23K. Afebrile  # Hyponatremia, Na+ stable at 128  # History of hepatic abscess June 2023. Resolved   Subjective   Feels okay. Tolerating clear liquids. Ambulating. Having bowel movements  Objective   03/12/22 EGD - No gross lesions in esophagus. Z-line irregular, 35 cm from the incisors. - Retained gastric fluid - suctioned ~200cc. - Erythematous mucosa in the stomach. - Erythematous duodenopathy in bulb. - Metal biliary stent at ampullary region. Duodenal erosions without bleeding on contralateral wall. 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 22 mm x 9 cm UCSEMS placed. We have covered the previously placed biliary stent with our new duodenal stent. - No gross lesions in the fourth portion of  the duodenum.   Imaging:  DG Abd 2 Views  Result Date: 03/14/2022 CLINICAL DATA:  Duodenal stent placement EXAM: ABDOMEN - 2 VIEW COMPARISON:  CT abdomen and pelvis 03/04/2022 FINDINGS: Interval placement of a duodenal stent. The stent projects over the expected course of the duodenal sweep. Redemonstrated biliary stent. Nonobstructive bowel-gas pattern. Tubular marker near the GE junction. Large phleboliths versus fibroid pelvis. IMPRESSION: Interval placement of duodenal stent in expected position. Electronically Signed   By: Placido Sou M.D.   On: 03/14/2022 08:34   Lab Results: Recent Labs    03/12/22 0338 03/13/22 0625 03/14/22 0330  WBC 16.2* 23.2* 22.9*  HGB 11.3* 10.4* 9.5*  HCT 32.8* 31.0* 28.8*  PLT 293 283 259   BMET Recent Labs    03/12/22 0338 03/13/22 0625 03/14/22 0330  NA 131* 130* 128*  K 3.8 3.4* 4.2  CL 98 98 100  CO2 '24 22 22  '$ GLUCOSE 106* 134* 125*  BUN <5* <5* <5*  CREATININE 0.44 0.51 0.47  CALCIUM 8.1* 7.7* 7.4*   LFT Recent Labs    03/13/22 0625 03/14/22 0330  PROT 5.9*  --   ALBUMIN 2.2* 2.0*  AST 12*  --   ALT 12  --   ALKPHOS 72  --   BILITOT 0.9  --      Scheduled inpatient medications:   amLODipine  10 mg Oral QHS   Chlorhexidine Gluconate Cloth  6 each Topical Daily   fentaNYL  1 patch Transdermal Q72H   insulin aspart  0-5 Units Subcutaneous QHS   insulin aspart  0-9 Units Subcutaneous TID WC   ondansetron (  ZOFRAN) IV  4 mg Intravenous Q6H   pantoprazole  40 mg Oral BID   sodium chloride flush  10-40 mL Intracatheter Q12H   Continuous inpatient infusions:   sodium chloride 10 mL/hr at 03/13/22 0859   0.9 % NaCl with KCl 20 mEq / L 100 mL/hr at 03/13/22 1319   heparin 1,600 Units/hr (03/14/22 0443)   PRN inpatient medications: sodium chloride, HYDROmorphone (DILAUDID) injection, labetalol, lip balm, liver oil-zinc oxide, loperamide, mouth rinse, prochlorperazine, sodium chloride flush  Vital signs in last 24  hours: Temp:  [98.5 F (36.9 C)-99.8 F (37.7 C)] 98.5 F (36.9 C) (07/27 0449) Pulse Rate:  [106-110] 106 (07/27 0449) Resp:  [18] 18 (07/27 0449) BP: (117-125)/(75-78) 117/75 (07/27 0449) SpO2:  [94 %-98 %] 94 % (07/27 0449) Last BM Date : 03/13/22  Intake/Output Summary (Last 24 hours) at 03/14/2022 0847 Last data filed at 03/13/2022 1930 Gross per 24 hour  Intake 1686.77 ml  Output --  Net 1686.77 ml     Physical Exam:  General: Alert female in NAD Heart:  Regular rate and rhythm.  Pulmonary: Normal respiratory effort Abdomen: Soft, nondistended, mild LUQ tenderness. A few bowel sounds.   Neurologic: Alert and oriented Psych: Pleasant. Cooperative.    Intake/Output from previous day: 07/26 0701 - 07/27 0700 In: 2046.8 [P.O.:1200; I.V.:846.8] Out: -  Intake/Output this shift: No intake/output data recorded.    Principal Problem:   Hyponatremia Active Problems:   Hypertension   Hypokalemia   Cirrhosis of liver (HCC)   Malignant neoplasm of pancreas [facility])   Pancreatic insufficiency   Mesenteric vein thrombosis (HCC)   Acute thrombosis of splenic vein   Intractable nausea and vomiting   Abdominal pain, epigastric   Hypomagnesemia   Duodenal obstruction, acquired   Biliary obstruction     LOS: 9 days   Tye Savoy ,NP 03/14/2022, 8:47 AM

## 2022-03-15 DIAGNOSIS — K7460 Unspecified cirrhosis of liver: Secondary | ICD-10-CM | POA: Diagnosis not present

## 2022-03-15 DIAGNOSIS — I82890 Acute embolism and thrombosis of other specified veins: Secondary | ICD-10-CM | POA: Diagnosis not present

## 2022-03-15 DIAGNOSIS — E876 Hypokalemia: Secondary | ICD-10-CM | POA: Diagnosis not present

## 2022-03-15 DIAGNOSIS — E871 Hypo-osmolality and hyponatremia: Secondary | ICD-10-CM | POA: Diagnosis not present

## 2022-03-15 LAB — COMPREHENSIVE METABOLIC PANEL
ALT: 11 U/L (ref 0–44)
AST: 13 U/L — ABNORMAL LOW (ref 15–41)
Albumin: 2.3 g/dL — ABNORMAL LOW (ref 3.5–5.0)
Alkaline Phosphatase: 78 U/L (ref 38–126)
Anion gap: 7 (ref 5–15)
BUN: <5 mg/dL — ABNORMAL LOW (ref 8–23)
CO2: 19 mmol/L — ABNORMAL LOW (ref 22–32)
Calcium: 7.6 mg/dL — ABNORMAL LOW (ref 8.9–10.3)
Chloride: 103 mmol/L (ref 98–111)
Creatinine, Ser: 0.44 mg/dL (ref 0.44–1.00)
GFR, Estimated: >60 mL/min (ref >60)
Glucose, Bld: 112 mg/dL — ABNORMAL HIGH (ref 70–99)
Potassium: 3.9 mmol/L (ref 3.5–5.1)
Sodium: 129 mmol/L — ABNORMAL LOW (ref 135–145)
Total Bilirubin: 0.7 mg/dL (ref 0.3–1.2)
Total Protein: 5.9 g/dL — ABNORMAL LOW (ref 6.5–8.1)

## 2022-03-15 LAB — CBC WITH DIFFERENTIAL/PLATELET
Abs Immature Granulocytes: 0.25 10*3/uL — ABNORMAL HIGH (ref 0.00–0.07)
Basophils Absolute: 0.1 10*3/uL (ref 0.0–0.1)
Basophils Relative: 1 %
Eosinophils Absolute: 0.2 10*3/uL (ref 0.0–0.5)
Eosinophils Relative: 1 %
HCT: 29.6 % — ABNORMAL LOW (ref 36.0–46.0)
Hemoglobin: 9.8 g/dL — ABNORMAL LOW (ref 12.0–15.0)
Immature Granulocytes: 1 %
Lymphocytes Relative: 10 %
Lymphs Abs: 2.1 10*3/uL (ref 0.7–4.0)
MCH: 28.6 pg (ref 26.0–34.0)
MCHC: 33.1 g/dL (ref 30.0–36.0)
MCV: 86.3 fL (ref 80.0–100.0)
Monocytes Absolute: 1.4 10*3/uL — ABNORMAL HIGH (ref 0.1–1.0)
Monocytes Relative: 7 %
Neutro Abs: 16.3 10*3/uL — ABNORMAL HIGH (ref 1.7–7.7)
Neutrophils Relative %: 80 %
Platelets: 297 10*3/uL (ref 150–400)
RBC: 3.43 MIL/uL — ABNORMAL LOW (ref 3.87–5.11)
RDW: 15.9 % — ABNORMAL HIGH (ref 11.5–15.5)
WBC: 20.3 10*3/uL — ABNORMAL HIGH (ref 4.0–10.5)
nRBC: 0 % (ref 0.0–0.2)

## 2022-03-15 LAB — HEPARIN LEVEL (UNFRACTIONATED)
Heparin Unfractionated: 0.29 IU/mL — ABNORMAL LOW (ref 0.30–0.70)
Heparin Unfractionated: 0.46 IU/mL (ref 0.30–0.70)

## 2022-03-15 LAB — GLUCOSE, CAPILLARY
Glucose-Capillary: 122 mg/dL — ABNORMAL HIGH (ref 70–99)
Glucose-Capillary: 134 mg/dL — ABNORMAL HIGH (ref 70–99)
Glucose-Capillary: 45 mg/dL — ABNORMAL LOW (ref 70–99)
Glucose-Capillary: 107 mg/dL — ABNORMAL HIGH (ref 70–99)
Glucose-Capillary: 97 mg/dL (ref 70–99)

## 2022-03-15 LAB — MAGNESIUM: Magnesium: 1.6 mg/dL — ABNORMAL LOW (ref 1.7–2.4)

## 2022-03-15 LAB — PHOSPHORUS: Phosphorus: 2.4 mg/dL — ABNORMAL LOW (ref 2.5–4.6)

## 2022-03-15 MED ORDER — APIXABAN 5 MG PO TABS
5.0000 mg | ORAL_TABLET | Freq: Two times a day (BID) | ORAL | Status: DC
Start: 2022-03-15 — End: 2022-03-20
  Administered 2022-03-15 – 2022-03-19 (×10): 5 mg via ORAL
  Filled 2022-03-15 (×10): qty 1

## 2022-03-15 MED ORDER — MAGNESIUM SULFATE 2 GM/50ML IV SOLN
2.0000 g | Freq: Once | INTRAVENOUS | Status: AC
  Administered 2022-03-15: 2 g via INTRAVENOUS
  Filled 2022-03-15: qty 50

## 2022-03-15 MED ORDER — HYDROMORPHONE HCL 2 MG PO TABS
1.0000 mg | ORAL_TABLET | ORAL | Status: DC | PRN
  Administered 2022-03-15 (×2): 1 mg via ORAL
  Filled 2022-03-15 (×2): qty 1

## 2022-03-15 MED ORDER — HYDROMORPHONE HCL 2 MG PO TABS
4.0000 mg | ORAL_TABLET | ORAL | Status: DC | PRN
  Administered 2022-03-15 – 2022-03-16 (×4): 4 mg via ORAL
  Filled 2022-03-15 (×4): qty 2

## 2022-03-15 MED ORDER — HYDROMORPHONE HCL 1 MG/ML IJ SOLN: 0.5000 mg | Freq: Four times a day (QID) | INTRAMUSCULAR | Status: DC | PRN

## 2022-03-15 NOTE — Progress Notes (Signed)
ANTICOAGULATION CONSULT NOTE  Pharmacy Consult for IV heparin Indication: splenic/SMV thrombosis on apixaban PTA  Allergies  Allergen Reactions   Lisinopril Swelling and Other (See Comments)    Facial and upper lip    Patient Measurements: Height: '5\' 3"'$  (160 cm) Weight: 68 kg (150 lb) IBW/kg (Calculated) : 52.4 Heparin Dosing Weight: 66 kg  Vital Signs: Temp: 98.9 F (37.2 C) (07/27 2039) BP: 121/77 (07/27 2039) Pulse Rate: 96 (07/27 2039)  Labs: Recent Labs    03/13/22 0625 03/13/22 1301 03/14/22 0330 03/14/22 1028 03/14/22 1831 03/15/22 0225  HGB 10.4*  --  9.5*  --   --  9.8*  HCT 31.0*  --  28.8*  --   --  29.6*  PLT 283  --  259  --   --  297  HEPARINUNFRC 0.34   < > 0.26* 0.38 0.33 0.29*  CREATININE 0.51  --  0.47  --   --   --    < > = values in this interval not displayed.     Estimated Creatinine Clearance: 64.9 mL/min (by C-G formula based on SCr of 0.47 mg/dL).   Assessment: 33 yoF with PMH cirrhosis, HepC, recurrent pancreatic cancer on chemo, portal vein and mesenteric thrombosis (off Apixaban x 2 weeks since she was not aware to continue after start pack) admitted for electrolyte derangements. May need esophageal/duodenal stenting and Apixaban held for potential GI procedures. Pharmacy consulted to dose IV heparin in the meantime.  Baseline INR, aPTT: not done Prior anticoagulation: Apixaban 5 mg PO BID, LD 7/22 @ 0813  Significant events: 7/25: IV heparin held at 0600 for EGD/duodenal stent placement. Post-procedure, IV heparin resumed without bolus at 1700 per GI recommendations.   Today, 03/15/2022: HL 0.29 subtherapeutic on 1600 units/hr Hgb 9.8, plts WNL No bleeding or infusion issues per RN  Goal of Therapy: Heparin level 0.3-0.7 units/ml Monitor platelets by anticoagulation protocol: Yes  Plan: Increase heparin drip to 1750 units/hr Recheck heparin level in 6 hours Daily CBC Monitor closely for s/sx of bleeding  Dolly Rias  RPh 03/15/2022, 3:57 AM

## 2022-03-15 NOTE — Progress Notes (Signed)
G PROGRESS NOTE  Melanie Alvarez XHB:[id number] DOB: [birth year]   PCP: Flossie Buffy, NP  Patient is from: Home.  Lives with husband.  Independently ambulates at baseline.  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  Lab work at the GI office concerning for hyponatremia and hypokalemia, and patient was directed to ED.  Of note, patient was off Eliquis for 2 weeks.  She did not know she was supposed to continue after starter pack.  In ED, Na 122.  K2.8.  CT abdomen and pelvis showed no acute finding but stable mild intra and extrahepatic biliary ductal dilation with palliative metabolic biliary stent in place, stable pancreatic mass with occlusion of the mesenteric and splenic vein.    Electrolytes improved.  Still with intractable nausea, vomiting and abdominal pain.  Oncology and GI consulted.  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  She had duodenal stent placed on 7/25.  GI symptoms improved.  GI gave recommendation and signed off.  Palliative medicine following  Subjective: Seen and examined earlier this morning.  No major events overnight of this morning.  Reports improvement in hip pain.  Has been tolerating full liquid diet.  She denies nausea or vomiting.  No diarrhea either.  We have discussed about trying soft diet and p.o. Dilaudid for severe pain and minimizing IV Dilaudid use.   Objective: Vitals:   03/14/22 1251 03/14/22 2039  03/15/22 0612 03/15/22 1132  BP: 117/77 121/77 113/82 114/70  Pulse: 100 96 (!) 105 93  Resp: '14 18 18 20  '$ Temp: 98.1 F (36.7 C) 98.9 F (37.2 C) (!) 97.4 F (36.3 C) 97.6 F (36.4 C)  TempSrc: Oral  Oral Oral  SpO2: 98% 100% 100% 99%  Weight:      Height:        Examination:  GENERAL: No apparent distress.  Nontoxic. HEENT: MMM.  Vision and hearing grossly intact.  NECK: Supple.  No apparent JVD.  RESP:  No IWOB.  Fair aeration bilaterally. CVS:  RRR. Heart sounds normal.  ABD/GI/GU: BS+. Abd soft.  Mild diffuse tenderness. MSK/EXT:  Moves extremities. No apparent deformity. No edema.  SKIN: no apparent skin lesion or wound NEURO: Awake and alert. Oriented appropriately.  No apparent focal neuro deficit. PSYCH: Calm. Normal affect.     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 7/25-duodenal stent placement  Microbiology summarized: None  Assessment and plan: Principal Problem:   Hyponatremia Active Problems:   Hypertension   Hypokalemia   Cirrhosis of liver (HCC)   Malignant neoplasm of pancreas (HCC)   Pancreatic insufficiency   Mesenteric vein thrombosis (HCC)   Acute thrombosis of splenic vein   Intractable nausea and vomiting   Abdominal pain, epigastric   Hypomagnesemia   Duodenal obstruction, acquired   Biliary obstruction  Intractable nausea/vomiting/abdominal pain/diarrhea: EGD with duodenitis and partial duodenal obstruction due to pancreatic mass.  CT abdomen without acute  finding but chronic intra and extra biliary ductal dilation with pancreatic mass.  Underwent duodenal stent placement on 03/12/2022.  Nausea, vomiting and diarrhea resolved.  Tolerated full liquid diet.  Abdominal pain improved. -Discontinue IV fluid -Continue Protonix and as needed antiemetics. -Advance diet to soft -P.o.  Dilaudid 4 mg every 4 hours severe pain -Continue fentanyl patch 25 mcg every 72 hours.  "Delirium" when tried 50 mcg -Decrease IV Dilaudid to 0.5 mg every 6 hours as needed for severe pain not controlled by p.o. -Appreciate help by palliative care -GI gave recommendation and signed off.  Hyponatremia/hypokalemia/hypomagnesemia: Hyponatremia improved.  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  Patient thought she did not need Eliquis and stopped taking after starter pack. -Resume p.o. Eliquis   Malignant neoplasm of pancreas: Followed by Dr. Marin Olp.  On systemic chemo. Pancreatic insufficiency -Appreciate input by oncology -GI discontinued Creon. -Pain management as above.   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 -Continue monitoring  Controlled NIDDM-2 with hyperglycemia: A1c 5.7%.  On glipizide and metformin at home. Recent Labs  Lab 03/14/22 1134 03/14/22 1635 03/14/22 2039 03/15/22 0719 03/15/22 1130  GLUCAP 126* 92 128* 122* 134*  -Continue SSI-sensitive  History of hepatic abscess-treated and resolved.  Status post drain removal on 6/29.  Essential hypertension: Normotensive for most part. -Continue amlodipine -Discontinued Avapro with the hope to help hyponatremia  Normocytic anemia: No report of melena or hematochezia.  Anemia panel suggests ACD Recent Labs    03/06/22 0838 03/07/22 0408 03/08/22 0421 03/09/22 0354 03/10/22 0325 03/11/22 0327 03/12/22 0338 03/13/22 0625 03/14/22 0330 03/15/22 0225  HGB 11.2* 10.7* 10.6* 10.9* 10.5* 10.6* 11.3* 10.4* 9.5* 9.8*  -Continue monitoring  Mobility, has not gotten out of the bed -OOB for meals and ambulate in the hall  Leukocytosis: Likely demargination and malignancy versus infection.  Improving. -Continue monitoring  Body mass index is 26.57 kg/m.           DVT prophylaxis:  On p.o. Eliquis. apixaban  (ELIQUIS) tablet 5 mg  Code Status: Full code Family Communication: None at bedside Level of care: Med-Surg Status is: Inpatient Remains inpatient appropriate because: Abdominal pain due to pancreatic cancer and duodenal stenosis   Final disposition: Home in the next 24 to 48 hours. Consultants:  Oncology Gastroenterology-signed off Palliative medicine  Sch Meds:  Scheduled Meds:  amLODipine  10 mg Oral QHS   apixaban  5 mg Oral BID   Chlorhexidine Gluconate Cloth  6 each Topical Daily   fentaNYL  1 patch Transdermal Q72H   insulin aspart  0-5 Units Subcutaneous QHS   insulin aspart  0-9 Units Subcutaneous TID WC   ondansetron (ZOFRAN) IV  4 mg Intravenous Q6H   pantoprazole  40 mg Oral BID   sodium chloride flush  10-40 mL Intracatheter Q12H   Continuous Infusions:  sodium chloride 10 mL/hr at 03/13/22 0859   PRN Meds:.sodium chloride, HYDROmorphone (DILAUDID) injection, HYDROmorphone, labetalol, lip balm, liver oil-zinc oxide, loperamide, mouth rinse, prochlorperazine, sodium chloride flush  Antimicrobials: Anti-infectives (From admission, onward)    Start     Dose/Rate Route Frequency Ordered Stop   03/12/22 1100  ciprofloxacin (CIPRO) IVPB 400 mg        400 mg 200 mL/hr over 60 Minutes Intravenous  Once 03/12/22 1058 03/12/22 1226        I have personally reviewed the following labs and images: CBC: Recent Labs  Lab 03/11/22 0327  03/12/22 0338 03/13/22 0625 03/14/22 0330 03/15/22 0225  WBC 13.4* 16.2* 23.2* 22.9* 20.3*  NEUTROABS  --   --   --   --  16.3*  HGB 10.6* 11.3* 10.4* 9.5* 9.8*  HCT 32.0* 32.8* 31.0* 28.8* 29.6*  MCV 86.3 84.8 85.2 86.5 86.3  PLT 250 293 283 259 297   BMP &GFR Recent Labs  Lab 03/11/22 0327 03/12/22 0338 03/13/22 0625 03/14/22 0330 03/15/22 0225  NA 128* 131* 130* 128* 129*  K 3.9 3.8 3.4* 4.2 3.9  CL 98 98 98 100 103  CO2 '22 24 22 22 '$ 19*  GLUCOSE 160* 106* 134* 125* 112*  BUN <5* <5* <5* <5* <5*  CREATININE 0.48  0.44 0.51 0.47 0.44  CALCIUM 8.1* 8.1* 7.7* 7.4* 7.6*  MG 1.5* 1.9 1.5* 1.7 1.6*  PHOS 2.7 3.3 3.5 2.2* 2.4*   Estimated Creatinine Clearance: 64.9 mL/min (by C-G formula based on SCr of 0.44 mg/dL). Liver & Pancreas: Recent Labs  Lab 03/09/22 0354 03/10/22 0325 03/11/22 0327 03/12/22 0338 03/13/22 0625 03/14/22 0330 03/15/22 0225  AST 15  --  15  --  12*  --  13*  ALT 14  --  14  --  12  --  11  ALKPHOS 54  --  74  --  72  --  78  BILITOT 0.9  --  0.9  --  0.9  --  0.7  PROT 6.2*  --  6.4*  --  5.9*  --  5.9*  ALBUMIN 2.5*   < > 2.5* 2.8* 2.2* 2.0* 2.3*   < > = values in this interval not displayed.   Recent Labs  Lab 03/11/22 0327 03/12/22 0338 03/13/22 0625  LIPASE '21 20 20   '$ No results for input(s): "AMMONIA" in the last 168 hours. Diabetic: No results for input(s): "HGBA1C" in the last 72 hours.  Recent Labs  Lab 03/14/22 1134 03/14/22 1635 03/14/22 2039 03/15/22 0719 03/15/22 1130  GLUCAP 126* 92 128* 122* 134*   Cardiac Enzymes: No results for input(s): "CKTOTAL", "CKMB", "CKMBINDEX", "TROPONINI" in the last 168 hours. No results for input(s): "PROBNP" in the last 8760 hours. Coagulation Profile: No results for input(s): "INR", "PROTIME" in the last 168 hours.  Thyroid Function Tests: No results for input(s): "TSH", "T4TOTAL", "FREET4", "T3FREE", "THYROIDAB" in the last 72 hours. Lipid Profile: No results for input(s): "CHOL", "HDL", "LDLCALC", "TRIG", "CHOLHDL", "LDLDIRECT" in the last 72 hours. Anemia Panel: No results for input(s): "VITAMINB12", "FOLATE", "FERRITIN", "TIBC", "IRON", "RETICCTPCT" in the last 72 hours.  Urine analysis:    Component Value Date/Time   COLORURINE YELLOW 02/15/2022 1059   APPEARANCEUR CLEAR 02/15/2022 1059   LABSPEC 1.013 02/15/2022 1059   PHURINE 7.0 02/15/2022 1059   GLUCOSEU NEGATIVE 02/15/2022 1059   HGBUR SMALL (A) 02/15/2022 1059   BILIRUBINUR NEGATIVE 02/15/2022 1059   BILIRUBINUR NEGATIVE 10/22/2018 1556    [place] 02/15/2022 1059   PROTEINUR NEGATIVE 02/15/2022 1059   UROBILINOGEN 0.2 10/22/2018 1556   NITRITE NEGATIVE 02/15/2022 1059   LEUKOCYTESUR SMALL (A) 02/15/2022 1059   Sepsis Labs: Invalid input(s): "PROCALCITONIN", "LACTICIDVEN"  Microbiology: No results found for this or any previous visit (from the past 240 hour(s)).  Radiology Studies: No results found.    Faylene Allerton T. [place]  If 7PM-7AM, please contact night-coverage [email or web address] 03/15/2022, 1:56 PM

## 2022-03-15 NOTE — Progress Notes (Signed)
[place] for IV heparin Indication: splenic/SMV thrombosis on apixaban PTA  Allergies  Allergen Reactions   Lisinopril Swelling and Other (See Comments)    Facial and upper lip    Patient Measurements: Height: '5\' 3"'$  (160 cm) Weight: 68 kg (150 lb) IBW/kg (Calculated) : 52.4 Heparin Dosing Weight: 66 kg  Vital Signs: Temp: 97.4 F (36.3 C) (07/28 0612) Temp Source: Oral (07/28 0612) BP: 113/82 (07/28 0612) Pulse Rate: 105 (07/28 0612)  Labs: Recent Labs    03/13/22 0625 03/13/22 1301 03/14/22 0330 03/14/22 1028 03/14/22 1831 03/15/22 0225 03/15/22 1000  HGB 10.4*  --  9.5*  --   --  9.8*  --   HCT 31.0*  --  28.8*  --   --  29.6*  --   PLT 283  --  259  --   --  297  --   HEPARINUNFRC 0.34   < > 0.26*   < > 0.33 0.29* 0.46  CREATININE 0.51  --  0.47  --   --  0.44  --    < > = values in this interval not displayed.     Estimated Creatinine Clearance: 64.9 mL/min (by C-G formula based on SCr of 0.44 mg/dL).   Assessment: 27 yoF with PMH cirrhosis, HepC, recurrent pancreatic cancer on chemo, portal vein and mesenteric thrombosis (off Apixaban x 2 weeks since she was not aware to continue after start pack) admitted for electrolyte derangements. May need esophageal/duodenal stenting and Apixaban held for potential GI procedures. Pharmacy consulted to dose IV heparin in the meantime.  Baseline INR, aPTT: not done Prior anticoagulation: Apixaban 5 mg PO BID, LD 7/22 @ 0813  Significant events: 7/25: IV heparin held at 0600 for EGD/duodenal stent placement. Post-procedure, IV heparin resumed without bolus at 1700 per GI recommendations.   Today, 03/15/2022: HL 0.46 therapeutic on 1750 units/hr Hgb 9.8, plts WNL No bleeding or infusion issues per RN  Goal of Therapy: Heparin level 0.3-0.7 units/ml Monitor platelets by anticoagulation protocol: Yes  Plan: Continue IV heparin infusion at 1750 units/hr Recheck confirmatory  heparin level in 6 hours Daily CBC & heparin level Monitor closely for s/sx of bleeding  Thank you for allowing pharmacy to be a part of this patient's care.  Royetta Asal, PharmD, BCPS Clinical Pharmacist [place] Please utilize Amion for appropriate phone number to reach the unit pharmacist ([place]) 03/15/2022 11:15 AM

## 2022-03-16 DIAGNOSIS — R112 Nausea with vomiting, unspecified: Secondary | ICD-10-CM | POA: Diagnosis not present

## 2022-03-16 DIAGNOSIS — R1013 Epigastric pain: Secondary | ICD-10-CM | POA: Diagnosis not present

## 2022-03-16 DIAGNOSIS — K315 Obstruction of duodenum: Secondary | ICD-10-CM | POA: Diagnosis not present

## 2022-03-16 DIAGNOSIS — E871 Hypo-osmolality and hyponatremia: Secondary | ICD-10-CM | POA: Diagnosis not present

## 2022-03-16 LAB — CBC
HCT: 27.8 % — ABNORMAL LOW (ref 36.0–46.0)
Hemoglobin: 9.3 g/dL — ABNORMAL LOW (ref 12.0–15.0)
MCH: 28.7 pg (ref 26.0–34.0)
MCHC: 33.5 g/dL (ref 30.0–36.0)
MCV: 85.8 fL (ref 80.0–100.0)
Platelets: 349 10*3/uL (ref 150–400)
RBC: 3.24 MIL/uL — ABNORMAL LOW (ref 3.87–5.11)
RDW: 15.9 % — ABNORMAL HIGH (ref 11.5–15.5)
WBC: 11.7 10*3/uL — ABNORMAL HIGH (ref 4.0–10.5)
nRBC: 0 % (ref 0.0–0.2)

## 2022-03-16 LAB — RENAL FUNCTION PANEL
Albumin: 2.1 g/dL — ABNORMAL LOW (ref 3.5–5.0)
Anion gap: 6 (ref 5–15)
BUN: <5 mg/dL — ABNORMAL LOW (ref 8–23)
CO2: 22 mmol/L (ref 22–32)
Calcium: 7.9 mg/dL — ABNORMAL LOW (ref 8.9–10.3)
Chloride: 102 mmol/L (ref 98–111)
Creatinine, Ser: 0.41 mg/dL — ABNORMAL LOW (ref 0.44–1.00)
GFR, Estimated: >60 mL/min (ref >60)
Glucose, Bld: 113 mg/dL — ABNORMAL HIGH (ref 70–99)
Phosphorus: 3 mg/dL (ref 2.5–4.6)
Potassium: 3.6 mmol/L (ref 3.5–5.1)
Sodium: 130 mmol/L — ABNORMAL LOW (ref 135–145)

## 2022-03-16 LAB — GLUCOSE, CAPILLARY
Glucose-Capillary: 106 mg/dL — ABNORMAL HIGH (ref 70–99)
Glucose-Capillary: 147 mg/dL — ABNORMAL HIGH (ref 70–99)
Glucose-Capillary: 117 mg/dL — ABNORMAL HIGH (ref 70–99)
Glucose-Capillary: 123 mg/dL — ABNORMAL HIGH (ref 70–99)

## 2022-03-16 LAB — MAGNESIUM: Magnesium: 1.7 mg/dL (ref 1.7–2.4)

## 2022-03-16 MED ORDER — HYDROMORPHONE HCL 2 MG PO TABS
2.0000 mg | ORAL_TABLET | ORAL | Status: DC | PRN
  Administered 2022-03-16 – 2022-03-18 (×12): 2 mg via ORAL
  Filled 2022-03-16 (×12): qty 1

## 2022-03-16 MED ORDER — FENTANYL 50 MCG/HR TD PT72
1.0000 | MEDICATED_PATCH | TRANSDERMAL | Status: DC
  Administered 2022-03-16 – 2022-03-22 (×3): 1 via TRANSDERMAL
  Filled 2022-03-16 (×3): qty 1

## 2022-03-16 MED ORDER — ONDANSETRON HCL 4 MG PO TABS
4.0000 mg | ORAL_TABLET | Freq: Four times a day (QID) | ORAL | Status: DC | PRN
  Administered 2022-03-16 – 2022-03-21 (×5): 4 mg via ORAL
  Filled 2022-03-16 (×5): qty 1

## 2022-03-16 NOTE — Progress Notes (Signed)
G PROGRESS NOTE  Melanie Alvarez ERX:[id number] DOB: August 13, 1956   PCP: Flossie Buffy, NP  Patient is from: Home.  Lives with husband.  Independently ambulates at baseline.  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  Lab work at the GI office concerning for hyponatremia and hypokalemia, and patient was directed to ED.  Of note, patient was off Eliquis for 2 weeks.  She did not know she was supposed to continue after starter pack.  In ED, Na 122.  K2.8.  CT abdomen and pelvis showed no acute finding but stable mild intra and extrahepatic biliary ductal dilation with palliative biliary stent in place, stable pancreatic mass with occlusion of the mesenteric and splenic vein.    Electrolytes improved.  Still with intractable nausea, vomiting and abdominal pain.  Oncology and GI consulted.  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  She had duodenal stent placed on 7/25.  GI symptoms improved.  GI gave recommendation and signed off.  Palliative medicine following  Subjective: Patient seen ambulating steadily to the bathroom and back.  Reports that she is feeling better today.  Finally able to tolerate some regular consistency food.  Had some scrambled eggs this morning.  No nausea or vomiting.  Having chronic lower abdominal pain.  Later this afternoon, RN communicated that patient was having increased pain in wanted to increase the frequency of her  oral opioids but Dr. Marin Olp recommended increasing the fentanyl patch from 25 to 50 mcg every 72 hours.  Objective: Vitals:   03/15/22 1132 03/15/22 2123 03/16/22 0408 03/16/22 1132  BP: 114/70 115/71 106/68 119/73  Pulse: 93 86 83 92  Resp: '20 18 18 16  '$ Temp: 97.6 F (36.4 C) 97.7 F (36.5 C) 97.7 F (36.5 C) 98 F (36.7 C)  TempSrc: Oral Oral  Oral  SpO2: 99% 100% 100% 100%  Weight:      Height:        Examination:  GENERAL: No apparent distress.  Nontoxic.  Middle-aged female, moderately built and nourished seen ambulating comfortably in the room without distress. RESP: Clear to auscultation.  No increased work of breathing. CVS: S1 and S2 heard, RRR.  No JVD, murmurs or pedal edema.  Not on telemetry. ABD/GI/GU: Nondistended and soft.  Mild lower quadrants abdominal tenderness without guarding, rigidity or rebound.  No organomegaly or masses appreciated.  Normal bowel sounds heard. MSK/EXT:  Moves extremities. No apparent deformity. No edema.  SKIN: no apparent skin lesion or wound NEURO: Awake and alert. Oriented appropriately.  No apparent focal neuro deficit. PSYCH: Calm. Normal affect.     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 7/25-duodenal stent placement  Microbiology summarized: None  Assessment and plan: Principal Problem:   Hyponatremia Active Problems:   Hypertension   Hypokalemia   Cirrhosis of liver (HCC)   Malignant neoplasm of pancreas (HCC)   Pancreatic insufficiency   Mesenteric vein thrombosis (  [place])   Acute thrombosis of splenic vein   Intractable nausea and vomiting   Abdominal pain, epigastric   Hypomagnesemia   Duodenal obstruction, acquired   Biliary obstruction  Intractable nausea/vomiting/abdominal pain/diarrhea: EGD with duodenitis and partial duodenal obstruction due to  pancreatic mass.  CT abdomen without acute finding but chronic intra and extra biliary ductal dilation with pancreatic mass.  Underwent duodenal stent placement on 03/12/2022.   Nausea, vomiting and diarrhea resolved.  Abdominal pain improved. Gradually advance diet, tolerating soft diet for the first day today. Scheduled IV Zofran changed to oral as needed.  Continue PPI. Dr. Marin Olp has cut back on her oral short acting opioids and as per his recommendation, increased fentanyl patch from 25 to 50 mcg every 72 hours.  Need to monitor closely for delirium which she reportedly had when she was on 50 mcg earlier.  Also on very low-dose as needed IV Dilaudid for severe pain not controlled with oral and topical products. -Appreciate help by palliative care -GI gave recommendation and signed off.  Hyponatremia/hypokalemia/hypomagnesemia: Hyponatremia improved/stable.  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  Patient thought she did not need Eliquis and stopped taking after starter pack. -Resumed p.o. Eliquis   Malignant neoplasm of pancreas: Followed by Dr. Marin Olp.  On systemic chemo. Pancreatic insufficiency -Appreciate input by oncology -GI discontinued Creon. -Pain management as above.   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 -Continue monitoring  Controlled NIDDM-2 with hyperglycemia: A1c 5.7%.  On glipizide and metformin at home.  Had hypoglycemia with CBG in the mid 40s on 7/28, may be related to poor oral intake.  Given how tightly her A1c is controlled, poor oral intake, CBGs also tightly controlled despite holding several doses of SSI, will DC SSI but continue to monitor CBGs.  History of hepatic abscess-treated and resolved.  Status post drain removal on 6/29.  Essential hypertension: Normotensive for most part. -Continue amlodipine -Discontinued Avapro with the hope to help  hyponatremia  Normocytic anemia: No report of melena or hematochezia.  Anemia panel suggests ACD Recent Labs    03/07/22 0408 03/08/22 0421 03/09/22 0354 03/10/22 0325 03/11/22 0327 03/12/22 0338 03/13/22 0625 03/14/22 0330 03/15/22 0225 03/16/22 0338  HGB 10.7* 10.6* 10.9* 10.5* 10.6* 11.3* 10.4* 9.5* 9.8* 9.3*  -Periodically follow CBC.  Minimize blood draws.  Mobility, had not gotten out of the bed -Seen ambulating.  Encourage same.  Leukocytosis: Likely demargination and malignancy versus infection.  Almost resolved.  Body mass index is 26.57 kg/m.     DVT prophylaxis:  On p.o. Eliquis. apixaban (ELIQUIS) tablet 5 mg  Code Status: Full code Family Communication: None at bedside Level of care: Med-Surg Status is: Inpatient Remains inpatient appropriate because: Abdominal pain due to pancreatic cancer and duodenal stenosis   Final disposition: Home in the next 24 to 48 hours. Consultants:  Oncology Gastroenterology-signed off Palliative medicine  Sch Meds:  Scheduled Meds:  amLODipine  10 mg Oral QHS   apixaban  5 mg Oral BID   Chlorhexidine Gluconate Cloth  6 each Topical Daily   fentaNYL  1 patch Transdermal Q72H   insulin aspart  0-5 Units Subcutaneous QHS   insulin aspart  0-9 Units Subcutaneous TID WC   pantoprazole  40 mg Oral BID   sodium chloride flush  10-40 mL Intracatheter Q12H   Continuous Infusions:  sodium chloride 10 mL/hr at 03/13/22 0859   PRN Meds:.sodium chloride, HYDROmorphone (DILAUDID) injection,  HYDROmorphone, labetalol, lip balm, liver oil-zinc oxide, loperamide, ondansetron, mouth rinse, prochlorperazine, sodium chloride flush  Antimicrobials: Anti-infectives (From admission, onward)    Start     Dose/Rate Route Frequency Ordered Stop   03/12/22 1100  ciprofloxacin (CIPRO) IVPB 400 mg        400 mg 200 mL/hr over 60 Minutes Intravenous  Once 03/12/22 1058 03/12/22 1226        I have personally reviewed the following  labs and images: CBC: Recent Labs  Lab 03/12/22 0338 03/13/22 0625 03/14/22 0330 03/15/22 0225 03/16/22 0338  WBC 16.2* 23.2* 22.9* 20.3* 11.7*  NEUTROABS  --   --   --  16.3*  --   HGB 11.3* 10.4* 9.5* 9.8* 9.3*  HCT 32.8* 31.0* 28.8* 29.6* 27.8*  MCV 84.8 85.2 86.5 86.3 85.8  PLT 293 283 259 297 349   BMP &GFR Recent Labs  Lab 03/12/22 0338 03/13/22 0625 03/14/22 0330 03/15/22 0225 03/16/22 0338  NA 131* 130* 128* 129* 130*  K 3.8 3.4* 4.2 3.9 3.6  CL 98 98 100 103 102  CO2 '24 22 22 '$ 19* 22  GLUCOSE 106* 134* 125* 112* 113*  BUN <5* <5* <5* <5* <5*  CREATININE 0.44 0.51 0.47 0.44 0.41*  CALCIUM 8.1* 7.7* 7.4* 7.6* 7.9*  MG 1.9 1.5* 1.7 1.6* 1.7  PHOS 3.3 3.5 2.2* 2.4* 3.0   Estimated Creatinine Clearance: 64.9 mL/min (A) (by C-G formula based on SCr of 0.41 mg/dL (L)). Liver & Pancreas: Recent Labs  Lab 03/11/22 0327 03/12/22 0338 03/13/22 0625 03/14/22 0330 03/15/22 0225 03/16/22 0338  AST 15  --  12*  --  13*  --   ALT 14  --  12  --  11  --   ALKPHOS 74  --  72  --  78  --   BILITOT 0.9  --  0.9  --  0.7  --   PROT 6.4*  --  5.9*  --  5.9*  --   ALBUMIN 2.5* 2.8* 2.2* 2.0* 2.3* 2.1*   Recent Labs  Lab 03/11/22 0327 03/12/22 0338 03/13/22 0625  LIPASE '21 20 20    '$ Diabetic: No results for input(s): "HGBA1C" in the last 72 hours.  Recent Labs  Lab 03/15/22 1620 03/15/22 1656 03/15/22 2124 03/16/22 0730 03/16/22 1130  GLUCAP 45* 107* 97 106* 147*     Urine analysis:    Component Value Date/Time   COLORURINE YELLOW 02/15/2022 1059   APPEARANCEUR CLEAR 02/15/2022 1059   LABSPEC 1.013 02/15/2022 1059   PHURINE 7.0 02/15/2022 1059   GLUCOSEU NEGATIVE 02/15/2022 1059   HGBUR SMALL (A) 02/15/2022 1059   BILIRUBINUR NEGATIVE 02/15/2022 1059   BILIRUBINUR NEGATIVE 10/22/2018 1556   KETONESUR NEGATIVE 02/15/2022 1059   PROTEINUR NEGATIVE 02/15/2022 1059   UROBILINOGEN 0.2 10/22/2018 1556   NITRITE NEGATIVE 02/15/2022 1059   LEUKOCYTESUR  SMALL (A) 02/15/2022 1059     Microbiology: No results found for this or any previous visit (from the past 240 hour(s)).  Radiology Studies: No results found.   Vernell Leep, MD,  FACP, [facility], [facility], [facility] (Care Management Physician Certified) [facility] [place]  To contact the attending provider between 7A-7P or the covering provider during after hours 7P-7A, please log into the web site [email or web address] and access using universal [place] password for that web site. If you do not have the password, please call the hospital operator.

## 2022-03-16 NOTE — Progress Notes (Signed)
She is still slowly increasing her nutritional intake.  She is on some soft foods right now.  She has had no problems with nausea or vomiting.  There is still some abdominal discomfort.  She thinks she is getting too much in the way of the short acting pain medicine.  We will cut this back to hydromorphone 2 mg every 4 hours as needed.  She is on the fentanyl patch which seems to help.  She has had no obvious bleeding.  She is on oral anticoagulation right now.  Her hemoglobin is 9.3.  This is trending downward a little bit.  I wonder if her iron might be on the low side.  I will check this.  I think maybe some folic acid may also help.  She has had no fever.  She is going to the bathroom.  There has been no cough or shortness of breath.  Her vital signs are pretty stable.  Temperature 97.7.  Pulse 83.  Blood pressure 106/68.  Her abdomen is soft.  Bowel sounds are present.  There is no fluid wave.  There is limited tenderness in the epigastric area.  Lungs are clear.  Cardiac exam regular rate and rhythm.  Hopefully, Melanie Alvarez will be able to go home soon if she is able to tolerate soft food.  It looks like pain control might not be all that bad for her right now.  Again she thinks she is on too much of the short acting hydromorphone.  Hopefully, when she gets home, we will be able to get her into the office and get her treated.  I do appreciate the wonderful care that she is gotten from everybody on 4 E.   Lattie Haw, MD  Psalms 6:2

## 2022-03-17 DIAGNOSIS — K315 Obstruction of duodenum: Secondary | ICD-10-CM | POA: Diagnosis not present

## 2022-03-17 DIAGNOSIS — R1013 Epigastric pain: Secondary | ICD-10-CM | POA: Diagnosis not present

## 2022-03-17 DIAGNOSIS — R112 Nausea with vomiting, unspecified: Secondary | ICD-10-CM | POA: Diagnosis not present

## 2022-03-17 DIAGNOSIS — E871 Hypo-osmolality and hyponatremia: Secondary | ICD-10-CM | POA: Diagnosis not present

## 2022-03-17 LAB — GLUCOSE, CAPILLARY
Glucose-Capillary: 128 mg/dL — ABNORMAL HIGH (ref 70–99)
Glucose-Capillary: 186 mg/dL — ABNORMAL HIGH (ref 70–99)
Glucose-Capillary: 200 mg/dL — ABNORMAL HIGH (ref 70–99)
Glucose-Capillary: 158 mg/dL — ABNORMAL HIGH (ref 70–99)

## 2022-03-17 MED ORDER — LOPERAMIDE HCL 2 MG PO CAPS
2.0000 mg | ORAL_CAPSULE | Freq: Three times a day (TID) | ORAL | Status: DC | PRN
  Administered 2022-03-17 (×2): 2 mg via ORAL
  Filled 2022-03-17 (×3): qty 1

## 2022-03-17 MED ORDER — HYDROMORPHONE HCL 1 MG/ML IJ SOLN: 0.5000 mg | INTRAMUSCULAR | Status: DC | PRN

## 2022-03-17 NOTE — Progress Notes (Signed)
G PROGRESS NOTE  Melanie Alvarez TIW:[id number] DOB: 07-25-1956   PCP: Flossie Buffy, NP  Patient is from: Home.  Lives with husband.  Independently ambulates at baseline.  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  Lab work at the GI office concerning for hyponatremia and hypokalemia, and patient was directed to ED.  Of note, patient was off Eliquis for 2 weeks.  She did not know she was supposed to continue after starter pack.  In ED, Na 122.  K2.8.  CT abdomen and pelvis showed no acute finding but stable mild intra and extrahepatic biliary ductal dilation with palliative biliary stent in place, stable pancreatic mass with occlusion of the mesenteric and splenic vein.    Electrolytes improved.  Still with intractable nausea, vomiting and abdominal pain.  Oncology and GI consulted.  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  She had duodenal stent placed on 7/25.  GI symptoms improved.  GI gave recommendation and signed off.  Palliative medicine following  Subjective: Today she stated that she had a rough day all day yesterday.  Ongoing abdominal pain not adequately controlled.  Multiple episodes of diarrhea, initially formed stool followed by loose/watery stools.  No BM since this morning when I saw her.  Had not eaten much.  Denied nausea or vomiting.  Also asking if her oral Dilaudid can be increased.  Objective: Vitals:   03/16/22 0408  03/16/22 1132 03/17/22 0451 03/17/22 1131  BP: 106/68 119/73 119/71 131/76  Pulse: 83 92 100 97  Resp: '18 16 18 16  '$ Temp: 97.7 F (36.5 C) 98 F (36.7 C) 98.3 F (36.8 C) 98.2 F (36.8 C)  TempSrc:  Oral Oral Oral  SpO2: 100% 100% 92% 98%  Weight:      Height:        Examination:  GENERAL: No apparent distress.  Nontoxic.  Middle-aged female, moderately built and nourished sitting up comfortably next to her bed this morning. RESP: Clear to auscultation.  No increased work of breathing. CVS: S1 and S2 heard, RRR.  No JVD, murmurs or pedal edema.  Not on telemetry. ABD/GI/GU: Nondistended and soft.  Mild lower quadrants abdominal tenderness, no different than yesterday, without guarding, rigidity or rebound.  No organomegaly or masses appreciated.  Normal bowel sounds heard. MSK/EXT:  Moves extremities. No apparent deformity. No edema.  SKIN: no apparent skin lesion or wound NEURO: Awake and alert. Oriented appropriately.  No apparent focal neuro deficit. PSYCH: Calm. Normal affect.     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 7/25-duodenal stent placement  Microbiology summarized: None  Assessment and plan: Principal Problem:   Hyponatremia Active Problems:   Hypertension   Hypokalemia   Cirrhosis of liver (HCC)   Malignant neoplasm of pancreas (HCC)   Pancreatic insufficiency   Mesenteric vein thrombosis (HCC)   Acute thrombosis of splenic vein   Intractable nausea and vomiting  Abdominal pain, epigastric   Hypomagnesemia   Duodenal obstruction, acquired   Biliary obstruction  Intractable nausea/vomiting/abdominal pain/diarrhea: EGD with duodenitis and partial duodenal obstruction due to pancreatic mass.  CT abdomen without acute finding but chronic intra and extra biliary ductal dilation with pancreatic  mass.  Underwent duodenal stent placement on 03/12/2022.   Nausea, vomiting and diarrhea resolved.  Abdominal pain improved. Gradually advance diet, tolerating soft diet for the first day today. Scheduled IV Zofran changed to oral as needed.  Continue PPI. Dr. Marin Olp has cut back on her oral short acting opioids and as per his recommendation, increased fentanyl patch from 25 to 50 mcg every 72 hours.  Need to monitor closely for delirium which she reportedly had when she was on 50 mcg earlier.  Also on very low-dose as needed IV Dilaudid for severe pain not controlled with oral and topical products. -Appreciate help by palliative care -GI gave recommendation and signed off. -Reports not having eaten much today but denies nausea or vomiting.  Continue diet as above as tolerated.  Trial of Imodium for diarrhea.  Hyponatremia/hypokalemia/hypomagnesemia: Hyponatremia improved/stable.  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  Follow BMP and CBC in AM.   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  Patient thought she did not need Eliquis and stopped taking after starter pack. -Resumed p.o. Eliquis   Malignant neoplasm of pancreas: Followed by Dr. Marin Olp.  On systemic chemo. Pancreatic insufficiency -Appreciate input by oncology -GI discontinued Creon. -Pain management as above.  Slightly increase the frequency of IV Dilaudid given her reported symptoms.  Monitor closely.   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 -Continue monitoring  Controlled NIDDM-2 with hyperglycemia: A1c 5.7%.  On glipizide and metformin at home.  Had hypoglycemia with CBG in the mid 40s on 7/28, may be related to poor oral intake.  Given how tightly her A1c is controlled, poor oral intake, CBGs also tightly controlled despite holding several doses of SSI, will DC SSI but continue to monitor CBGs.  CBGs mostly controlled in the 100s now.  History of hepatic abscess-treated  and resolved.  Status post drain removal on 6/29.  Essential hypertension: Normotensive for most part. -Continue amlodipine -Discontinued Avapro with the hope to help hyponatremia  Normocytic anemia: No report of melena or hematochezia.  Anemia panel suggests ACD Recent Labs    03/07/22 0408 03/08/22 0421 03/09/22 0354 03/10/22 0325 03/11/22 0327 03/12/22 0338 03/13/22 0625 03/14/22 0330 03/15/22 0225 03/16/22 0338  HGB 10.7* 10.6* 10.9* 10.5* 10.6* 11.3* 10.4* 9.5* 9.8* 9.3*  -Periodically follow CBC.  Minimize blood draws.  Leukocytosis: Likely demargination and malignancy versus infection.  Almost resolved.  Body mass index is 26.57 kg/m.     DVT prophylaxis:  On p.o. Eliquis. apixaban (ELIQUIS) tablet 5 mg  Code Status: Full code Family Communication: None at bedside Level of care: Med-Surg Status is: Inpatient Remains inpatient appropriate because: Abdominal pain due to pancreatic cancer and duodenal stenosis   Final disposition: Home in the next 24 to 48 hours. Consultants:  Oncology Gastroenterology-signed off Palliative medicine  Sch Meds:  Scheduled Meds:  amLODipine  10 mg Oral QHS   apixaban  5 mg Oral BID   Chlorhexidine Gluconate Cloth  6 each Topical Daily   fentaNYL  1 patch Transdermal Q72H   pantoprazole  40 mg Oral BID   sodium chloride flush  10-40 mL Intracatheter Q12H   Continuous Infusions:  sodium chloride 10 mL/hr at 03/13/22 0859   PRN  Meds:.sodium chloride, HYDROmorphone (DILAUDID) injection, HYDROmorphone, labetalol, lip balm, liver oil-zinc oxide, loperamide, ondansetron, mouth rinse, prochlorperazine, sodium chloride flush  Antimicrobials: Anti-infectives (From admission, onward)    Start     Dose/Rate Route Frequency Ordered Stop   03/12/22 1100  ciprofloxacin (CIPRO) IVPB 400 mg        400 mg 200 mL/hr over 60 Minutes Intravenous  Once 03/12/22 1058 03/12/22 1226        I have personally reviewed the following labs  and images: CBC: Recent Labs  Lab 03/12/22 0338 03/13/22 0625 03/14/22 0330 03/15/22 0225 03/16/22 0338  WBC 16.2* 23.2* 22.9* 20.3* 11.7*  NEUTROABS  --   --   --  16.3*  --   HGB 11.3* 10.4* 9.5* 9.8* 9.3*  HCT 32.8* 31.0* 28.8* 29.6* 27.8*  MCV 84.8 85.2 86.5 86.3 85.8  PLT 293 283 259 297 349   BMP &GFR Recent Labs  Lab 03/12/22 0338 03/13/22 0625 03/14/22 0330 03/15/22 0225 03/16/22 0338  NA 131* 130* 128* 129* 130*  K 3.8 3.4* 4.2 3.9 3.6  CL 98 98 100 103 102  CO2 '24 22 22 '$ 19* 22  GLUCOSE 106* 134* 125* 112* 113*  BUN <5* <5* <5* <5* <5*  CREATININE 0.44 0.51 0.47 0.44 0.41*  CALCIUM 8.1* 7.7* 7.4* 7.6* 7.9*  MG 1.9 1.5* 1.7 1.6* 1.7  PHOS 3.3 3.5 2.2* 2.4* 3.0   Estimated Creatinine Clearance: 64.9 mL/min (A) (by C-G formula based on SCr of 0.41 mg/dL (L)). Liver & Pancreas: Recent Labs  Lab 03/11/22 0327 03/12/22 0338 03/13/22 0625 03/14/22 0330 03/15/22 0225 03/16/22 0338  AST 15  --  12*  --  13*  --   ALT 14  --  12  --  11  --   ALKPHOS 74  --  72  --  78  --   BILITOT 0.9  --  0.9  --  0.7  --   PROT 6.4*  --  5.9*  --  5.9*  --   ALBUMIN 2.5* 2.8* 2.2* 2.0* 2.3* 2.1*   Recent Labs  Lab 03/11/22 0327 03/12/22 0338 03/13/22 0625  LIPASE '21 20 20    '$ Diabetic: No results for input(s): "HGBA1C" in the last 72 hours.  Recent Labs  Lab 03/16/22 1648 03/16/22 2021 03/17/22 0735 03/17/22 1130 03/17/22 1648  GLUCAP 117* 123* 128* 186* 200*     Urine analysis:    Component Value Date/Time   COLORURINE YELLOW 02/15/2022 [place] 02/15/2022 1059   LABSPEC 1.013 02/15/2022 1059   PHURINE 7.0 02/15/2022 1059   GLUCOSEU NEGATIVE 02/15/2022 1059   HGBUR SMALL (A) 02/15/2022 1059   BILIRUBINUR NEGATIVE 02/15/2022 1059   BILIRUBINUR NEGATIVE 10/22/2018 1556   KETONESUR NEGATIVE 02/15/2022 1059   PROTEINUR NEGATIVE 02/15/2022 1059   UROBILINOGEN 0.2 10/22/2018 1556   NITRITE NEGATIVE 02/15/2022 1059   LEUKOCYTESUR  SMALL (A) 02/15/2022 1059     Microbiology: No results found for this or any previous visit (from the past 240 hour(s)).  Radiology Studies: No results found.   Vernell Leep, MD,  FACP, [facility], [facility], [facility] (Care Management Physician Certified) [facility] [place]  To contact the attending provider between 7A-7P or the covering provider during after hours 7P-7A, please log into the web site [email or web address] and access using universal [place] password for that web site. If you do not have the password, please call the hospital operator.

## 2022-03-18 DIAGNOSIS — K315 Obstruction of duodenum: Secondary | ICD-10-CM | POA: Diagnosis not present

## 2022-03-18 DIAGNOSIS — R112 Nausea with vomiting, unspecified: Secondary | ICD-10-CM | POA: Diagnosis not present

## 2022-03-18 DIAGNOSIS — E871 Hypo-osmolality and hyponatremia: Secondary | ICD-10-CM | POA: Diagnosis not present

## 2022-03-18 DIAGNOSIS — R1013 Epigastric pain: Secondary | ICD-10-CM | POA: Diagnosis not present

## 2022-03-18 LAB — BASIC METABOLIC PANEL
Anion gap: 9 (ref 5–15)
BUN: <5 mg/dL — ABNORMAL LOW (ref 8–23)
CO2: 24 mmol/L (ref 22–32)
Calcium: 7.9 mg/dL — ABNORMAL LOW (ref 8.9–10.3)
Chloride: 94 mmol/L — ABNORMAL LOW (ref 98–111)
Creatinine, Ser: 0.52 mg/dL (ref 0.44–1.00)
GFR, Estimated: >60 mL/min (ref >60)
Glucose, Bld: 147 mg/dL — ABNORMAL HIGH (ref 70–99)
Potassium: 3.1 mmol/L — ABNORMAL LOW (ref 3.5–5.1)
Sodium: 127 mmol/L — ABNORMAL LOW (ref 135–145)

## 2022-03-18 LAB — PHOSPHORUS: Phosphorus: 4.2 mg/dL (ref 2.5–4.6)

## 2022-03-18 LAB — CBC
HCT: 29.1 % — ABNORMAL LOW (ref 36.0–46.0)
Hemoglobin: 9.8 g/dL — ABNORMAL LOW (ref 12.0–15.0)
MCH: 28.5 pg (ref 26.0–34.0)
MCHC: 33.7 g/dL (ref 30.0–36.0)
MCV: 84.6 fL (ref 80.0–100.0)
Platelets: 335 10*3/uL (ref 150–400)
RBC: 3.44 MIL/uL — ABNORMAL LOW (ref 3.87–5.11)
RDW: 15.6 % — ABNORMAL HIGH (ref 11.5–15.5)
WBC: 10.3 10*3/uL (ref 4.0–10.5)
nRBC: 0 % (ref 0.0–0.2)

## 2022-03-18 LAB — GLUCOSE, CAPILLARY
Glucose-Capillary: 237 mg/dL — ABNORMAL HIGH (ref 70–99)
Glucose-Capillary: 239 mg/dL — ABNORMAL HIGH (ref 70–99)
Glucose-Capillary: 175 mg/dL — ABNORMAL HIGH (ref 70–99)
Glucose-Capillary: 247 mg/dL — ABNORMAL HIGH (ref 70–99)

## 2022-03-18 LAB — MAGNESIUM: Magnesium: 1.4 mg/dL — ABNORMAL LOW (ref 1.7–2.4)

## 2022-03-18 MED ORDER — ALUM & MAG HYDROXIDE-SIMETH 200-200-20 MG/5ML PO SUSP
30.0000 mL | ORAL | Status: DC | PRN
  Administered 2022-03-18 – 2022-03-20 (×4): 30 mL via ORAL
  Filled 2022-03-18 (×5): qty 30

## 2022-03-18 MED ORDER — POTASSIUM CHLORIDE CRYS ER 20 MEQ PO TBCR
40.0000 meq | EXTENDED_RELEASE_TABLET | ORAL | Status: AC
  Administered 2022-03-18 (×2): 40 meq via ORAL
  Filled 2022-03-18 (×2): qty 2

## 2022-03-18 MED ORDER — HYDROMORPHONE HCL 2 MG PO TABS
1.0000 mg | ORAL_TABLET | ORAL | Status: DC | PRN
  Administered 2022-03-18 – 2022-03-22 (×17): 1 mg via ORAL
  Filled 2022-03-18 (×18): qty 1

## 2022-03-18 MED ORDER — MAGNESIUM SULFATE 4 GM/100ML IV SOLN
4.0000 g | Freq: Once | INTRAVENOUS | Status: AC
  Administered 2022-03-18: 4 g via INTRAVENOUS
  Filled 2022-03-18: qty 100

## 2022-03-18 NOTE — Progress Notes (Addendum)
HEMATOLOGY-ONCOLOGY PROGRESS NOTE  ASSESSMENT AND PLAN: 1.  Metastatic pancreatic cancer 2.  Superior mesenteric vein thrombus 3.  Intractable nausea/vomiting/abdominal pain/diarrhea, resolved 4.  Hyponatremia, stable 5.  Hypokalemia 6.  Hypomagnesemia 7.  Anemia 8.  Cirrhosis 9.  Type 2 diabetes mellitus  -Overall, Melanie Alvarez's pain is better controlled with current pain medication regimen.  Nausea and vomiting has resolved.  Melanie Alvarez is trying to slowly eat more.  Once Melanie Alvarez is discharged from [facility] hospital, will arrange for outpatient follow-up to discuss systemic treatment for her metastatic pancreatic cancer. -Continue Eliquis. -Continue current pain medications and antiemetics as needed. -Continue to monitor electrolytes closely and replete as needed. -Hemoglobin overall stable.  Monitor.  Melanie Alvarez  SUBJECTIVE: Had a bowel movement earlier this morning which was formed.  Taking in mostly liquids including boost.  Had a small amount of soft food yesterday.  Melanie Alvarez did not develop any nausea or vomiting.  Abdominal pain currently controlled.  Oncology History  Malignant neoplasm of pancreas ([place])  09/20/2019 Initial Diagnosis   Pancreatic cancer ([place])   10/13/2019 - 11/26/2019 Chemotherapy   Alvarez is on Treatment Plan : PANCREAS Modified FOLFIRINOX q14d x 4 cycles     12/24/2021 Cancer Staging   Staging form: Exocrine Pancreas, AJCC 8th Edition - Clinical stage from 12/24/2021: Stage IV (rcT4, cN2, cM1) - Signed by Volanda Napoleon, MD on 12/24/2021 Stage prefix: Recurrence Histologic grade (G): G2 Histologic grading system: 3 grade system   01/02/2022 -  Chemotherapy   Alvarez is on Treatment Plan : PANCREATIC Abraxane / Gemcitabine D1,8,15 q28d        REVIEW OF SYSTEMS:   Review of Systems  Constitutional:  Negative for fever.  Respiratory: Negative.    Cardiovascular: Negative.   Gastrointestinal:  Negative for nausea and vomiting.  Skin: Negative.   Neurological:  Negative.     I have reviewed Melanie past medical history, past surgical history, social history and family history with Melanie Alvarez and they are unchanged from previous note.   PHYSICAL EXAMINATION: ECOG PERFORMANCE STATUS: 1 - Symptomatic but completely ambulatory  Vitals:   03/17/22 2055 03/18/22 0456  BP: (!) 143/86 122/73  Pulse: 99 93  Resp: 18 18  Temp: 97.7 F (36.5 C) 98.2 F (36.8 C)  SpO2: 100% 100%   Filed Weights   03/04/22 1436 03/08/22 1243  Weight: 68 kg 68 kg    Intake/Output from previous day: No intake/output data recorded.  Physical Exam Vitals reviewed.  Constitutional:      General: Melanie Alvarez is not in acute distress. Eyes:     General: No scleral icterus.    Conjunctiva/sclera: Conjunctivae normal.  Cardiovascular:     Rate and Rhythm: Normal rate.  Pulmonary:     Effort: Pulmonary effort is normal. No respiratory distress.  Abdominal:     Palpations: Abdomen is soft.     Tenderness: There is no abdominal tenderness.  Skin:    General: Skin is warm and dry.  Neurological:     Mental Status: Melanie Alvarez is alert and oriented to person, place, and time.     LABORATORY DATA:  I have reviewed Melanie data as listed    Latest Ref Rng & Units 03/18/2022    3:32 AM 03/16/2022    3:38 AM 03/15/2022    2:25 AM  CMP  Glucose 70 - 99 mg/dL 147  113  112   BUN 8 - 23 mg/dL <5  <5  <5   Creatinine 0.44 - 1.00  mg/dL 0.52  0.41  0.44   Sodium 135 - 145 mmol/L 127  130  129   Potassium 3.5 - 5.1 mmol/L 3.1  3.6  3.9   Chloride 98 - 111 mmol/L 94  102  103   CO2 22 - 32 mmol/L '24  22  19   '$ Calcium 8.9 - 10.3 mg/dL 7.9  7.9  7.6   Total Protein 6.5 - 8.1 g/dL   5.9   Total Bilirubin 0.3 - 1.2 mg/dL   0.7   Alkaline Phos 38 - 126 U/L   78   AST 15 - 41 U/L   13   ALT 0 - 44 U/L   11     Lab Results  Component Value Date   WBC 10.3 03/18/2022   HGB 9.8 (L) 03/18/2022   HCT 29.1 (L) 03/18/2022   MCV 84.6 03/18/2022   PLT 335 03/18/2022   NEUTROABS 16.3 (H)  03/15/2022    Lab Results  Component Value Date   CAN199 <2 12/24/2021    DG Abd 2 Views  Result Date: 03/14/2022 CLINICAL DATA:  Duodenal stent placement EXAM: ABDOMEN - 2 VIEW COMPARISON:  CT abdomen and pelvis 03/04/2022 FINDINGS: Interval placement of a duodenal stent. Melanie stent projects over Melanie expected course of Melanie duodenal sweep. Redemonstrated biliary stent. Nonobstructive bowel-gas pattern. Tubular marker near Melanie GE junction. Large phleboliths versus fibroid pelvis. IMPRESSION: Interval placement of duodenal stent in expected position. Electronically Signed   By: Placido Sou M.D.   On: 03/14/2022 08:34   DG C-Arm 1-60 Min  Result Date: 03/12/2022 CLINICAL DATA:  Fluoroscopic assistance for placement of stent EXAM: DG C-ARM 1-60 MIN FLUOROSCOPY: Fluoroscopy Time:  90 seconds Radiation Exposure Index (if provided by Melanie fluoroscopic device): 16.5 Number of Acquired Spot Images: 3 COMPARISON:  CT done on 03/04/2022 FINDINGS: Theophilus Kinds is noted in place. In Melanie early images, biliary stent is noted in Melanie course of Melanie common bile duct. In Melanie final image, there is a new stent oriented transversely, possibly in pancreatic duct or bowel lumen. IMPRESSION: Fluoroscopic assistance was provided for stent placement. Electronically Signed   By: Elmer Picker M.D.   On: 03/12/2022 11:13   CT ABDOMEN PELVIS W CONTRAST  Result Date: 03/04/2022 CLINICAL DATA:  Nausea, vomiting, unspecified abdominal pain EXAM: CT ABDOMEN AND PELVIS WITH CONTRAST TECHNIQUE: Multidetector CT imaging of Melanie abdomen and pelvis was performed using Melanie standard protocol following bolus administration of intravenous contrast. RADIATION DOSE REDUCTION: This exam was performed according to Melanie departmental dose-optimization program which includes automated exposure control, adjustment of the mA and/or kV according to Alvarez size and/or use of iterative reconstruction technique. CONTRAST:  148m OMNIPAQUE IOHEXOL  300 MG/ML  SOLN COMPARISON:  02/14/2022 FINDINGS: Lower chest: No acute abnormality. Hepatobiliary: Palliative metallic biliary stent within Melanie common duct extending through Melanie ampulla. Pneumobilia is in keeping with patency of Melanie a stented segment. Mild intra and extrahepatic biliary ductal dilation is unchanged. Previously noted hepatic abscess drainage catheter has been removed in Melanie interval. Small wedge like region of non enhancement within Melanie left hepatic lobe likely represents a small amount of residual scarring. No focal intrahepatic mass. Gas within Melanie gallbladder lumen again noted. Melanie gallbladder is not distended no pericholecystic inflammatory changes identified. Pancreas: Partially calcified pancreatic head mass is again seen, grossly unchanged from prior examination but poorly delineated on this examination. Dilation of Melanie pancreatic duct distal to Melanie mass is again seen with associated pancreatic parenchymal  atrophy. No superimposed peripancreatic inflammatory changes. Spleen: Unremarkable Adrenals/Urinary Tract: Melanie adrenal glands are unremarkable. Melanie kidneys are normal in size and position. 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 Melanie kidneys are otherwise unremarkable. Melanie bladder is unremarkable. Stomach/Bowel: Stomach is within normal limits. Appendix appears normal. No evidence of bowel wall thickening, distention, or inflammatory changes. No free intraperitoneal gas or fluid. Vascular/Lymphatic: Occlusion of Melanie terminal superior mesenteric vein and splenic vein is again identified in Melanie region of Melanie pancreatic mass. Melanie main portal vein remains patent, reconstituted via Melanie left gastric vein. Moderate aortoiliac atherosclerotic calcification. No aortic aneurysm. No pathologic adenopathy within Melanie abdomen and pelvis. Stable shotty periceliac adenopathy, nonspecific, measuring up to 9 mm in short axis diameter at axial image # 20/2.  Reproductive: Uterus and bilateral adnexa are unremarkable. Other: No abdominal wall hernia Musculoskeletal: No acute bone abnormality. No lytic or blastic bone lesion. IMPRESSION: 1. No acute intra-abdominal pathology identified. No definite radiographic explanation for Melanie Alvarez's reported symptoms. 2. Interval removal of hepatic abscess drainage catheter. No recurrent abscess identified. Wedge like region of probable fibrosis in Melanie area of prior abscess. 3. Stable mild intra and extrahepatic biliary ductal dilation. Palliative metallic biliary stent remains in place. Pneumobilia in keeping with patency of Melanie stented segment. 4. Grossly stable pancreatic head mass with associated occlusion of Melanie terminal superior mesenteric vein and splenic vein. 5. 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 Electronically Signed   By: Fidela Salisbury M.D.   On: 03/04/2022 19:55     Future Appointments  Date Time Provider [place]  04/17/2022  1:50 PM Mansouraty, Telford Nab., MD [facility]      LOS: 13 days   ADDENDUM: I agree with Melanie above assessment by Erasmo Downer.  Melanie problem that Melanie Alvarez is having is with Melanie pain.  It is really hard to get a grip on how to best take care of her pain.  Maybe, we can get a Palliative Care to help [place] out.  I know Melanie Alvarez is on Melanie pain patch.  I know Melanie Alvarez is on some oral Dilaudid.  Last time that I saw her, Melanie Alvarez wanted Melanie Dilaudid cut in half because it was too strong for her.  We did this over Melanie weekend.  I am not sure how we can try to help with Melanie pain right now.  I think Melanie pain could certainly be coming from her malignancy.  I wonder if we might consider a celiac block for her.  As such, maybe we need to get Gastroenterology involved to see if they would consider a celiac block or maybe get Interventional Radiology in to see about a celiac block.  Ultimately, we can have to treat Melanie cancer.  This could certainly help [place]  out.  Lattie Haw, MD

## 2022-03-18 NOTE — Progress Notes (Signed)
G PROGRESS NOTE  Melanie Alvarez:[id number] DOB: Oct 22, 1955   PCP: Flossie Buffy, NP  Patient is from: Home.  Lives with husband.  Independently ambulates at baseline.  DOA: 03/04/2022 LOS: 68  Chief complaints Chief Complaint  Patient presents with   Abnormal Labs     Brief Narrative / Interim history: 66 year old F with PMH of liver cirrhosis, treated hep C, recurrent pancreatic cancer with insufficiency on systemic chemo, portal vein and mesenteric vein thrombosis on Eliquis, history of hepatic abscess, DM-2, HTN and HLD presented to her gastroenterologist with nausea, vomiting and abdominal pain.  Lab work at the GI office concerning for hyponatremia and hypokalemia, and patient was directed to ED.  Of note, patient was off Eliquis for 2 weeks.  She did not know she was supposed to continue after starter pack.  In ED, Na 122.  K2.8.  CT abdomen and pelvis showed no acute finding but stable mild intra and extrahepatic biliary ductal dilation with palliative biliary stent in place, stable pancreatic mass with occlusion of the mesenteric and splenic vein.    Electrolytes improved.  Still with intractable nausea, vomiting and abdominal pain.  Oncology and GI consulted.  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  She had duodenal stent placed on 7/25.  GI symptoms improved.  GI gave recommendation and signed off.  Palliative medicine following  Subjective: Although she states that she had a rough last 24 hours, on further exploring, she agrees that it was actually better.  She did not have any BM since my last visit until this morning at 10 AM, at which time she had a formed stool.  Has been scared to eat soft food and mostly drinking liquids, boost and had just a little soft food yesterday without nausea or vomiting.  Abdominal  pain seems to be controlled on current regimen.  She has been actively ambulating multiple times in the halls.  Encouraged and willing to try more soft food today.  Objective: Vitals:   03/17/22 0451 03/17/22 1131 03/17/22 2055 03/18/22 0456  BP: 119/71 131/76 (!) 143/86 122/73  Pulse: 100 97 99 93  Resp: '18 16 18 18  '$ Temp: 98.3 F (36.8 C) 98.2 F (36.8 C) 97.7 F (36.5 C) 98.2 F (36.8 C)  TempSrc: Oral Oral  Oral  SpO2: 92% 98% 100% 100%  Weight:      Height:        Examination:  GENERAL: No apparent distress.  Nontoxic.  Middle-aged female, moderately built and nourished sitting up comfortably in bed. RESP: Clear to auscultation.  No increased work of breathing. CVS: S1 and S2 heard, RRR.  No JVD, murmurs or pedal edema.  Not on telemetry. ABD/GI/GU: Abdomen is nondistended, soft and nontender.  No organomegaly or masses appreciated.  Normal bowel sounds heard. MSK/EXT:  Moves extremities. No apparent deformity. No edema.  SKIN: no apparent skin lesion or wound NEURO: Awake and alert. Oriented appropriately.  No apparent focal neuro deficit. PSYCH: Calm. Normal affect.     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 7/25-duodenal stent placement  Microbiology summarized: None  Assessment and plan: Principal Problem:   Hyponatremia Active Problems:   Hypertension   Hypokalemia   Cirrhosis of liver ([facility])   Malignant neoplasm of pancreas (  [place])   Pancreatic insufficiency   Mesenteric vein thrombosis ([facility])   Acute thrombosis of splenic vein   Intractable nausea and vomiting   Abdominal pain, epigastric   Hypomagnesemia   Duodenal obstruction, acquired   Biliary obstruction  Intractable nausea/vomiting/abdominal pain/diarrhea: EGD with duodenitis and partial duodenal obstruction due to pancreatic mass.  CT  abdomen without acute finding but chronic intra and extra biliary ductal dilation with pancreatic mass.  Underwent duodenal stent placement on 03/12/2022.   Nausea, vomiting and diarrhea resolved.  Abdominal pain improved.  However patient still hesitant to advance diet, had a long discussion with her and encouraged her to keep trying. Scheduled IV Zofran changed to oral as needed.  Continue PPI. Continue low-dose as needed p.o. Dilaudid for moderate pain, IV Dilaudid for severe pain and fentanyl patch 50 mcg every 72 hours.  No delirium seen since increasing the fentanyl dose. -Appreciate help by palliative care -GI gave recommendation and signed off. Trial of soft diet today.  Continue rest of symptomatic and supportive care.  Hyponatremia/hypokalemia/hypomagnesemia: Hyponatremia improved/stable.  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   -Serum sodium back down from 130-127, unclear if this is related to poor oral intake and some diarrhea yesterday.  Potassium also down at 3.1.  Replace K.  Magnesium down to 1.4, replace IV Follow BMP and magnesium in AM.   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  Patient thought she did not need Eliquis and stopped taking after starter pack. -Resumed p.o. Eliquis   Malignant neoplasm of pancreas: Followed by Dr. Marin Olp.  On systemic chemo. Pancreatic insufficiency -Appreciate input by oncology -GI discontinued Creon. -Pain management as above.  Slightly increase the frequency of IV Dilaudid given her reported symptoms.  Monitor closely.   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 -Continue monitoring  Controlled NIDDM-2 with hyperglycemia: A1c 5.7%.  On glipizide and metformin at home.  Had hypoglycemia with CBG in the mid 40s on 7/28, may be related to poor oral intake.  Given how tightly her A1c is controlled, poor oral intake, CBGs also tightly controlled despite holding several doses of SSI, DC'd  SSI but continue to monitor CBGs.  CBGs mostly controlled in the 100s with occasional upticks in the 200s.  History of hepatic abscess-treated and resolved.  Status post drain removal on 6/29.  Essential hypertension: Normotensive for most part. -Continue amlodipine -Discontinued Avapro with the hope to help hyponatremia  Normocytic anemia: No report of melena or hematochezia.  Anemia panel suggests ACD -Periodically follow CBC.  Minimize blood draws.  Stable.  Leukocytosis: Likely demargination and malignancy versus infection.  Resolved.  Body mass index is 26.57 kg/m.     DVT prophylaxis: apixaban (ELIQUIS) tablet 5 mg  Code Status: Full code Family Communication: None at bedside Level of care: Med-Surg Status is: Inpatient Remains inpatient appropriate because: Abdominal pain due to pancreatic cancer and duodenal stenosis   Final disposition: DC home pending tolerating diet better, pain and diarrhea controlled.  Hopefully in the next 1 to 2 days  Consultants:  Oncology Gastroenterology-signed off Palliative medicine  Sch Meds:  Scheduled Meds:  amLODipine  10 mg Oral QHS   apixaban  5 mg Oral BID   Chlorhexidine Gluconate Cloth  6 each Topical Daily   fentaNYL  1 patch Transdermal Q72H   pantoprazole  40 mg Oral BID   sodium chloride flush  10-40 mL Intracatheter Q12H   Continuous Infusions:  sodium chloride 10 mL/hr at 03/13/22 [phone]  PRN Meds:.sodium chloride, HYDROmorphone (DILAUDID) injection, HYDROmorphone, labetalol, lip balm, liver oil-zinc oxide, loperamide, ondansetron, mouth rinse, prochlorperazine, sodium chloride flush  Antimicrobials: Anti-infectives (From admission, onward)    Start     Dose/Rate Route Frequency Ordered Stop   03/12/22 1100  ciprofloxacin (CIPRO) IVPB 400 mg        400 mg 200 mL/hr over 60 Minutes Intravenous  Once 03/12/22 1058 03/12/22 1226        I have personally reviewed the following labs and images: CBC: Recent Labs   Lab 03/13/22 0625 03/14/22 0330 03/15/22 0225 03/16/22 0338 03/18/22 0332  WBC 23.2* 22.9* 20.3* 11.7* 10.3  NEUTROABS  --   --  16.3*  --   --   HGB 10.4* 9.5* 9.8* 9.3* 9.8*  HCT 31.0* 28.8* 29.6* 27.8* 29.1*  MCV 85.2 86.5 86.3 85.8 84.6  PLT 283 259 297 349 335   BMP &GFR Recent Labs  Lab 03/13/22 0625 03/14/22 0330 03/15/22 0225 03/16/22 0338 03/18/22 0332  NA 130* 128* 129* 130* 127*  K 3.4* 4.2 3.9 3.6 3.1*  CL 98 100 103 102 94*  CO2 22 22 19* 22 24  GLUCOSE 134* 125* 112* 113* 147*  BUN <5* <5* <5* <5* <5*  CREATININE 0.51 0.47 0.44 0.41* 0.52  CALCIUM 7.7* 7.4* 7.6* 7.9* 7.9*  MG 1.5* 1.7 1.6* 1.7 1.4*  PHOS 3.5 2.2* 2.4* 3.0 4.2   Estimated Creatinine Clearance: 64.9 mL/min (by C-G formula based on SCr of 0.52 mg/dL). Liver & Pancreas: Recent Labs  Lab 03/12/22 0338 03/13/22 0625 03/14/22 0330 03/15/22 0225 03/16/22 0338  AST  --  12*  --  13*  --   ALT  --  12  --  11  --   ALKPHOS  --  72  --  78  --   BILITOT  --  0.9  --  0.7  --   PROT  --  5.9*  --  5.9*  --   ALBUMIN 2.8* 2.2* 2.0* 2.3* 2.1*   Recent Labs  Lab 03/12/22 0338 03/13/22 0625  LIPASE 20 20    Diabetic: No results for input(s): "HGBA1C" in the last 72 hours.  Recent Labs  Lab 03/17/22 0735 03/17/22 1130 03/17/22 1648 03/17/22 2057 03/18/22 0801  GLUCAP 128* 186* 200* 158* 237*     Urine analysis:    Component Value Date/Time   COLORURINE YELLOW 02/15/2022 [place] 02/15/2022 1059   LABSPEC 1.013 02/15/2022 1059   PHURINE 7.0 02/15/2022 1059   GLUCOSEU NEGATIVE 02/15/2022 1059   HGBUR SMALL (A) 02/15/2022 1059   BILIRUBINUR NEGATIVE 02/15/2022 [place] 10/22/2018 1556   KETONESUR NEGATIVE 02/15/2022 1059   PROTEINUR NEGATIVE 02/15/2022 1059   UROBILINOGEN 0.2 10/22/2018 1556   NITRITE NEGATIVE 02/15/2022 1059   LEUKOCYTESUR SMALL (A) 02/15/2022 1059     Microbiology: No results found for this or any previous visit  (from the past 240 hour(s)).  Radiology Studies: No results found.   Vernell Leep, MD,  FACP, [facility], [facility], [facility] (Care Management Physician Certified) [facility] [place]  To contact the attending provider between 7A-7P or the covering provider during after hours 7P-7A, please log into the web site [email or web address] and access using universal [place] password for that web site. If you do not have the password, please call the hospital operator.

## 2022-03-19 ENCOUNTER — Inpatient Hospital Stay: Payer: BC Managed Care – PPO | Admitting: Hematology & Oncology

## 2022-03-19 ENCOUNTER — Inpatient Hospital Stay: Payer: BC Managed Care – PPO

## 2022-03-19 DIAGNOSIS — R1013 Epigastric pain: Secondary | ICD-10-CM | POA: Diagnosis not present

## 2022-03-19 DIAGNOSIS — R112 Nausea with vomiting, unspecified: Secondary | ICD-10-CM | POA: Diagnosis not present

## 2022-03-19 DIAGNOSIS — K315 Obstruction of duodenum: Secondary | ICD-10-CM | POA: Diagnosis not present

## 2022-03-19 DIAGNOSIS — E871 Hypo-osmolality and hyponatremia: Secondary | ICD-10-CM | POA: Diagnosis not present

## 2022-03-19 LAB — BASIC METABOLIC PANEL
Anion gap: 8 (ref 5–15)
BUN: <5 mg/dL — ABNORMAL LOW (ref 8–23)
CO2: 26 mmol/L (ref 22–32)
Calcium: 7.8 mg/dL — ABNORMAL LOW (ref 8.9–10.3)
Chloride: 96 mmol/L — ABNORMAL LOW (ref 98–111)
Creatinine, Ser: 0.51 mg/dL (ref 0.44–1.00)
GFR, Estimated: >60 mL/min (ref >60)
Glucose, Bld: 157 mg/dL — ABNORMAL HIGH (ref 70–99)
Potassium: 3.9 mmol/L (ref 3.5–5.1)
Sodium: 130 mmol/L — ABNORMAL LOW (ref 135–145)

## 2022-03-19 LAB — GLUCOSE, CAPILLARY
Glucose-Capillary: 156 mg/dL — ABNORMAL HIGH (ref 70–99)
Glucose-Capillary: 242 mg/dL — ABNORMAL HIGH (ref 70–99)
Glucose-Capillary: 180 mg/dL — ABNORMAL HIGH (ref 70–99)
Glucose-Capillary: 169 mg/dL — ABNORMAL HIGH (ref 70–99)

## 2022-03-19 LAB — MAGNESIUM: Magnesium: 1.7 mg/dL (ref 1.7–2.4)

## 2022-03-19 MED ORDER — SODIUM CHLORIDE 0.9 % IV SOLN: INTRAVENOUS | Status: DC | PRN

## 2022-03-19 NOTE — Progress Notes (Addendum)
[facility] Re-Consult Note   CC: Metastatic pancreatic cancer, abdominal pain  History of presenting illness: Melanie Alvarez is a 66 y.o. female with metastatic pancreatic cancer, chronic SMA and splenic vein occlusion.   She was initially diagnosed with pancreatic cancer 2/202. S/P ERCP with metal stent placement for malignant common bile duct stricture per Dr. Rush Landmark.  She underwent several courses of chemotherapy which completed July 2021.  Unfortunately, she had recurrence of pancreatic cancer 11/2021.  CT showed progression of the tumor involving the pancreatic head, portal and peripancreatic adenopathy and occlusion of the SMV and splenic vein.  She was started on Eliquis and received 1 cycle of chemotherapy.    She was hospitalized 01/16/2022 with shortness of breath and image studies showed a 9.8 x 9.2 cm fluid-filled collection concerning for hepatic abscess which was drained by IR and treated with antibiotics.    She was readmitted to the hospital 03/04/2022 with worsening upper abdominal pain. CTAP showed a patent metal stent in the common bile duct with pneumobilia, unchanged mild ductal dilation, the calcified pancreatic head mass was unchanged in size but poorly delineated on that exam, with dilation of the pancreatic duct distal, occlusion of the SMV and splenic vein (on Eliquis), the stable periaortic adenopathy and no evidence for hepatic abscess.  She was seen by our inpatient GI service 03/06/2022.  She underwent an EGD 03/08/2022 which showed a large volume of bilious gastric fluid, an acquired extrinsic duodenal stenosis, duodenal erosions without bleeding and previously placed endoclips in the gastric cardia.  Repeat EGD 03/12/2022 showed 200 cc of retained gastric fluid, a metal biliary stent at the ampullary region, duodenal erosions without bleeding and an acquired duodenal stenosis in D2/Ampullary region/D3. 22 mm x 9 cm palliative stent was placed. Dr.  Rush Landmark which covered the previously placed biliary stent with new duodenal stent.  Our GI service signed off on 03/14/2022.  Due to recurrent abdominal pain secondary to her pancreatic cancer, Dr. Marin Olp is concerned she has invasion of the celiac plexus for which she may require a celiac block by GI or IR.   Subjective: Currently, she rates her left-sided abdominal pain as 7 out of 10 which is manageable at this time.  She has intermittent nausea without vomiting.  She is tolerating a soft diet.  She passed a small light brown bowel movement this morning without rectal bleeding or melena.  No chest pain or shortness of breath.  Her goddaughter is at the bedside.    Objective:   EGD 03/08/2022: - Normal esophagus. - Large volume of bilious gastric fluid. - Previously placed endoclip in gastric cardia. - Previously placed endoclip in gastric cardia. - Acquired extrinsic duodenal stenosis. - Duodenal erosions without bleeding. - No specimens collected.  EGD 03/12/2022: - No gross lesions in esophagus. Z-line irregular, 35 cm from the incisors. - Retained gastric fluid - suctioned ~200cc. - Erythematous mucosa in the stomach. - Erythematous duodenopathy in bulb. - Metal biliary stent at ampullary region. Duodenal erosions without bleeding on contralateral wall. 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 22 mm x 9 cm UCSEMS placed. We have covered the previously placed biliary stent with our new duodenal stent. - No gross lesions in the fourth portion of the duodenum.  Vital signs in last 24 hours: Temp:  [97.8 F (36.6 C)-98.1 F (36.7 C)] 97.8 F (36.6 C) (08/01 0706) Pulse Rate:  [92-93] 92 (08/01 0706) Resp:  [16-20] 20 (08/01 0706) BP: (120-125)/(79-91) 120/90 (  08/01 0706) SpO2:  [100 %] 100 % (08/01 0706) Last BM Date : 03/18/22  General: Very pleasant 66 year old female in no acute distress. Heart: Regular rate and rhythm, no murmurs. Pulm: Breath sounds clear  throughout. Abdomen: Soft, mild upper abdominal distention.  Mild to moderate tenderness throughout the left upper and lower abdomen without rebound or guarding.  Mild tenderness to the right upper and lower quadrants. Extremities:  Without edema. Neurologic:  Alert and  oriented x 4. Grossly normal neurologically. Psych:  Alert and cooperative. Normal mood and affect.  Intake/Output from previous day: 07/31 0701 - 08/01 0700 In: 349.8 [P.O.:240; I.V.:109.8] Out: -  Intake/Output this shift: No intake/output data recorded.  Lab Results: Recent Labs    03/18/22 0332  WBC 10.3  HGB 9.8*  HCT 29.1*  PLT 335   BMET Recent Labs    03/18/22 0332 03/19/22 0606  NA 127* 130*  K 3.1* 3.9  CL 94* 96*  CO2 24 26  GLUCOSE 147* 157*  BUN <5* <5*  CREATININE 0.52 0.51  CALCIUM 7.9* 7.8*   LFT No results for input(s): "PROT", "ALBUMIN", "AST", "ALT", "ALKPHOS", "BILITOT", "BILIDIR", "IBILI" in the last 72 hours. PT/INR No results for input(s): "LABPROT", "INR" in the last 72 hours. Hepatitis Panel No results for input(s): "HEPBSAG", "HCVAB", "HEPAIGM", "HEPBIGM" in the last 72 hours.  No results found.  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 -Celiac block per Dr. Rush Landmark versus by IR.  Patient is adamant that she wants only Dr. Rush Landmark to place the celiac block.  Timing to be determined. -Eliquis will likely be to be held for 2 days prior to celiac block placement.  -Pain management per the hospitalist -Ondansetron 4 mg p.o. or IV every 6 hours as needed -Continue pantoprazole 40 mg twice daily -Await further recommendations per Dr. 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 I agree with the APP's note, impression and recommendations. No further additional recommendations. Dr. Rush Landmark has agreed to perform celiac plexus block, but the timing of which is unclear and will be dependent on his availability and anesthesia support.  Abiola Behring E. Candis Schatz, MD [facility]

## 2022-03-19 NOTE — Progress Notes (Signed)
The problem that she is still having is abdominal discomfort.  This really does sound like pain from the pancreatic cancer.  It sounds like pain that we see when the celiac plexus is invaded.  As such, she really needs to be seen by either Gastroenterology or Interventional Radiology to see if they can do a celiac plexus block on her.  I am not sure which group would do this.  She says the pain is burning.  It is mostly on the left side.  She is having some bowel movements.  She is walking around.  She seems a little dehydrated.  We probably need to give her some IV fluid.  She has had no fever.  She had no cough or shortness of breath.  There is been no obvious bleeding.  I will see the labs have been back yet today.  Her vital signs are all stable.  Temperature 97.8.  Pulse 92.  Blood pressure 120/90.  Her abdominal exam is soft.  Bowel sounds are present.  There may be little bit of distention.  She has some tenderness in the epigastric area.  There is no obvious fluid wave.  There is no palpable liver or spleen tip.  Lungs are clear.  Cardiac exam regular rate and rhythm.  Extremity shows no clubbing, cyanosis or edema.  Again, I suspect the problem with the pain might be invasion of the celiac plexus.  I think that she could benefit from a celiac block.  Again, I am not sure who would do this.  I know that interventional radiology has done this in the past.  However, I think that Gastroenterology with their endoscopic ultrasound also are very adept at doing this.  I do appreciate the great care she is getting from everybody on 4 E.  Lattie Haw, MD  Darlyn Chamber 30:17

## 2022-03-19 NOTE — Progress Notes (Addendum)
G PROGRESS NOTE  Melanie Alvarez JQZ:[id number] DOB: [birth year]   PCP: Flossie Buffy, NP  Patient is from: Home.  Lives with husband.  Independently ambulates at baseline.  DOA: 03/04/2022 LOS: 93  Chief complaints Chief Complaint  Patient presents with   Abnormal Labs     Brief Narrative / Interim history: 66 year old F with PMH of liver cirrhosis, treated hep C, recurrent pancreatic cancer with insufficiency on systemic chemo, portal vein and mesenteric vein thrombosis on Eliquis, history of hepatic abscess, DM-2, HTN and HLD presented to her gastroenterologist with nausea, vomiting and abdominal pain.  Lab work at the GI office concerning for hyponatremia and hypokalemia, and patient was directed to ED.  Of note, patient was off Eliquis for 2 weeks.  She did not know she was supposed to continue after starter pack.  In ED, Na 122.  K2.8.  CT abdomen and pelvis showed no acute finding but stable mild intra and extrahepatic biliary ductal dilation with palliative biliary stent in place, stable pancreatic mass with occlusion of the mesenteric and splenic vein.    Electrolytes improved.  Still with intractable nausea, vomiting and abdominal pain.  Oncology and GI consulted.  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  She had duodenal stent placed on 7/25.  GI symptoms improved.  GI gave recommendation and signed off.  Palliative medicine seen and have been followed recently.  Due to ongoing abdominal pain, Dr. Marin Olp concerned about celiac plexus invasion by cancer, recommended celiac plexus block, [facility] consulted back again.  Subjective: Patient actually seems to be doing better in the last 24 hours.  Reports that she tolerated soft food better, had some fish and other stuff last night without nausea or vomiting.  Now having  formed stools without diarrhea.  States that she is using pain med only every 4 hours.  Objective: Vitals:   03/18/22 1400 03/18/22 2107 03/19/22 0706 03/19/22 1530  BP: (!) 121/91 125/79 (!) 120/90 123/73  Pulse: 93 93 92 86  Resp: '16 18 20 18  '$ Temp: 98.1 F (36.7 C) 97.8 F (36.6 C) 97.8 F (36.6 C)   TempSrc: Oral Oral Oral   SpO2: 100% 100% 100% 100%  Weight:      Height:        Examination:  GENERAL: No apparent distress.  Nontoxic.  Middle-aged female, moderately built and nourished sitting up comfortably in bed.  Does not look in any pain. RESP: Clear to auscultation.  No increased work of breathing. CVS: S1 and S2 heard, RRR.  No JVD, murmurs or pedal edema.  Not on telemetry. ABD/GI/GU: Abdomen is nondistended, soft and nontender.  No organomegaly or masses appreciated.  Normal bowel sounds heard. MSK/EXT:  Moves extremities. No apparent deformity. No edema.  SKIN: no apparent skin lesion or wound NEURO: Awake and alert. Oriented appropriately.  No apparent focal neuro deficit. PSYCH: Calm. Normal affect.     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  CT abdomen without acute finding but chronic intra and extra biliary  ductal dilation with pancreatic mass.  Underwent duodenal stent placement on 03/12/2022.   Nausea, vomiting and diarrhea resolved.  Abdominal pain improved.  However patient still hesitant to advance diet, had a long discussion with her and encouraged her to keep trying. Scheduled IV Zofran changed to oral as needed.  Continue PPI. Continue low-dose as needed p.o. Dilaudid for moderate pain, IV Dilaudid for severe pain and fentanyl patch 50 mcg every 72 hours.  No delirium seen since increasing the fentanyl dose. -Appreciate help by palliative care but have not seen her since 7/27 and may need to call them back as needed As noted above, appears to be tolerating soft diet, pain is better controlled but still requiring frequent oral Dilaudid, has used 5 doses in all yesterday and 5 doses thus far today. Due to ongoing abdominal pain, Dr. Marin Olp concerned about celiac plexus invasion by cancer, recommended celiac plexus block, [facility] consulted back again.  Hyponatremia/hypokalemia/hypomagnesemia: Hyponatremia improved/stable.  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   -Continuing to periodically follow and replace as needed.  Serum sodium essentially stable in the low 130s, hypomagnesemia and hypokalemia replaced.   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  Patient thought she did not need Eliquis and stopped taking after starter pack. -Resumed p.o. Eliquis.  As per GI follow-up, will need to be held 2 days prior to celiac plexus block timing of which is yet to be determined.   Malignant neoplasm of pancreas: Followed by Dr. Marin Olp.  On systemic chemo. Pancreatic insufficiency -Appreciate input by oncology -GI discontinued Creon. -Pain management as above.    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 -Continue monitoring  Controlled NIDDM-2 with hyperglycemia: A1c 5.7%.  On glipizide and metformin at home. Given how tightly her A1c is  controlled, poor oral intake, CBGs also tightly controlled despite holding several doses of SSI, DC'd SSI but continue to monitor CBGs.  CBGs mostly controlled in the 100s with occasional upticks in the 200s.  History of hepatic abscess-treated and resolved.  Status post drain removal on 6/29.  Essential hypertension: Normotensive for most part. -Continue amlodipine -Discontinued Avapro with the hope to help hyponatremia  Normocytic anemia: No report of melena or hematochezia.  Anemia panel suggests ACD -Periodically follow CBC.  Minimize blood draws.  Stable.  Leukocytosis: Likely demargination and malignancy versus infection.  Resolved.  Body mass index is 26.57 kg/m.     DVT prophylaxis: apixaban (ELIQUIS) tablet 5 mg  Code Status: Full code Family Communication: I discussed with patient's spouse via phone, updated care and answered all questions. Level of care: Med-Surg Status is: Inpatient Remains inpatient appropriate because: Abdominal pain due to pancreatic cancer and duodenal stenosis.  GI now consulted for possible celiac plexus block, timing yet to be determined.   Consultants:  Oncology Gastroenterology-signed off Palliative medicine  Sch Meds:  Scheduled Meds:  amLODipine  10 mg Oral QHS   apixaban  5 mg Oral BID   Chlorhexidine Gluconate Cloth  6 each Topical Daily   fentaNYL  1 patch Transdermal Q72H   pantoprazole  40 mg Oral BID   sodium chloride flush  10-40 mL Intracatheter Q12H   Continuous Infusions:  sodium chloride     PRN Meds:.sodium chloride, alum & mag hydroxide-simeth, HYDROmorphone, labetalol, lip balm, liver oil-zinc oxide, loperamide, ondansetron, mouth rinse, prochlorperazine, sodium chloride flush  Antimicrobials: Anti-infectives (From admission, onward)    Start     Dose/Rate Route Frequency Ordered Stop   03/12/22 1100  ciprofloxacin (CIPRO) IVPB 400 mg        400 mg 200 mL/hr over 60 Minutes Intravenous  Once 03/12/22 1058  03/12/22 1226        I have personally reviewed the following labs and images: CBC: Recent Labs  Lab 03/13/22 0625 03/14/22 0330 03/15/22 0225 03/16/22 0338 03/18/22 0332  WBC 23.2* 22.9* 20.3* 11.7* 10.3  NEUTROABS  --   --  16.3*  --   --   HGB 10.4* 9.5* 9.8* 9.3* 9.8*  HCT 31.0* 28.8* 29.6* 27.8* 29.1*  MCV 85.2 86.5 86.3 85.8 84.6  PLT 283 259 297 349 335   BMP &GFR Recent Labs  Lab 03/13/22 0625 03/14/22 0330 03/15/22 0225 03/16/22 0338 03/18/22 0332 03/19/22 0606  NA 130* 128* 129* 130* 127* 130*  K 3.4* 4.2 3.9 3.6 3.1* 3.9  CL 98 100 103 102 94* 96*  CO2 22 22 19* '22 24 26  '$ GLUCOSE 134* 125* 112* 113* 147* 157*  BUN <5* <5* <5* <5* <5* <5*  CREATININE 0.51 0.47 0.44 0.41* 0.52 0.51  CALCIUM 7.7* 7.4* 7.6* 7.9* 7.9* 7.8*  MG 1.5* 1.7 1.6* 1.7 1.4* 1.7  PHOS 3.5 2.2* 2.4* 3.0 4.2  --    Estimated Creatinine Clearance: 64.9 mL/min (by C-G formula based on SCr of 0.51 mg/dL). Liver & Pancreas: Recent Labs  Lab 03/13/22 0625 03/14/22 0330 03/15/22 0225 03/16/22 0338  AST 12*  --  13*  --   ALT 12  --  11  --   ALKPHOS 72  --  78  --   BILITOT 0.9  --  0.7  --   PROT 5.9*  --  5.9*  --   ALBUMIN 2.2* 2.0* 2.3* 2.1*   Recent Labs  Lab 03/13/22 0625  LIPASE 20    Diabetic: No results for input(s): "HGBA1C" in the last 72 hours.  Recent Labs  Lab 03/18/22 1641 03/18/22 2156 03/19/22 0727 03/19/22 1152 03/19/22 1643  GLUCAP 175* 247* 156* 242* 180*     Urine analysis:    Component Value Date/Time   COLORURINE YELLOW 02/15/2022 1059   APPEARANCEUR CLEAR 02/15/2022 1059   LABSPEC 1.013 02/15/2022 1059   PHURINE 7.0 02/15/2022 1059   GLUCOSEU NEGATIVE 02/15/2022 1059   HGBUR SMALL (A) 02/15/2022 1059   BILIRUBINUR NEGATIVE 02/15/2022 1059   BILIRUBINUR NEGATIVE 10/22/2018 1556   KETONESUR NEGATIVE 02/15/2022 1059   PROTEINUR NEGATIVE 02/15/2022 1059   UROBILINOGEN 0.2 10/22/2018 1556   NITRITE NEGATIVE 02/15/2022 1059    LEUKOCYTESUR SMALL (A) 02/15/2022 1059     Microbiology: No results found for this or any previous visit (from the past 240 hour(s)).  Radiology Studies: No results found.   Vernell Leep, MD,  FACP, [facility], [facility], [facility] (Care Management Physician Certified) [facility]  To contact the attending provider between 7A-7P or the covering provider during after hours 7P-7A, please log into the web site [email or web address] and access using universal [place] password for that web site. If you do not have the password, please call the hospital operator.

## 2022-03-20 DIAGNOSIS — C250 Malignant neoplasm of head of pancreas: Secondary | ICD-10-CM | POA: Diagnosis not present

## 2022-03-20 DIAGNOSIS — I82890 Acute embolism and thrombosis of other specified veins: Secondary | ICD-10-CM | POA: Diagnosis not present

## 2022-03-20 DIAGNOSIS — E871 Hypo-osmolality and hyponatremia: Secondary | ICD-10-CM | POA: Diagnosis not present

## 2022-03-20 DIAGNOSIS — I10 Essential (primary) hypertension: Secondary | ICD-10-CM | POA: Diagnosis not present

## 2022-03-20 LAB — GLUCOSE, CAPILLARY
Glucose-Capillary: 158 mg/dL — ABNORMAL HIGH (ref 70–99)
Glucose-Capillary: 228 mg/dL — ABNORMAL HIGH (ref 70–99)
Glucose-Capillary: 126 mg/dL — ABNORMAL HIGH (ref 70–99)
Glucose-Capillary: 228 mg/dL — ABNORMAL HIGH (ref 70–99)

## 2022-03-20 LAB — BASIC METABOLIC PANEL
Anion gap: 8 (ref 5–15)
BUN: <5 mg/dL — ABNORMAL LOW (ref 8–23)
CO2: 27 mmol/L (ref 22–32)
Calcium: 8.2 mg/dL — ABNORMAL LOW (ref 8.9–10.3)
Chloride: 91 mmol/L — ABNORMAL LOW (ref 98–111)
Creatinine, Ser: 0.53 mg/dL (ref 0.44–1.00)
GFR, Estimated: >60 mL/min (ref >60)
Glucose, Bld: 164 mg/dL — ABNORMAL HIGH (ref 70–99)
Potassium: 4 mmol/L (ref 3.5–5.1)
Sodium: 126 mmol/L — ABNORMAL LOW (ref 135–145)

## 2022-03-20 LAB — HEPARIN LEVEL (UNFRACTIONATED): Heparin Unfractionated: >1.1 IU/mL — ABNORMAL HIGH (ref 0.30–0.70)

## 2022-03-20 LAB — APTT: aPTT: >200 seconds (ref 24–36)

## 2022-03-20 LAB — MAGNESIUM: Magnesium: 1.7 mg/dL (ref 1.7–2.4)

## 2022-03-20 MED ORDER — HEPARIN (PORCINE) 25000 UT/250ML-% IV SOLN
1750.0000 [IU]/h | INTRAVENOUS | Status: DC
  Administered 2022-03-20: 1750 [IU]/h via INTRAVENOUS
  Filled 2022-03-20: qty 250

## 2022-03-20 MED ORDER — HEPARIN (PORCINE) 25000 UT/250ML-% IV SOLN
1500.0000 [IU]/h | INTRAVENOUS | Status: DC
Start: 2022-03-20 — End: 2022-03-21
  Administered 2022-03-21: 1500 [IU]/h via INTRAVENOUS
  Filled 2022-03-20: qty 250

## 2022-03-20 MED ORDER — GABAPENTIN 400 MG PO CAPS
400.0000 mg | ORAL_CAPSULE | Freq: Three times a day (TID) | ORAL | Status: DC
  Administered 2022-03-20 – 2022-03-24 (×11): 400 mg via ORAL
  Filled 2022-03-20 (×12): qty 1

## 2022-03-20 MED ORDER — HYDROCORTISONE 1 % EX CREA
TOPICAL_CREAM | CUTANEOUS | Status: DC | PRN
Start: 2022-03-20 — End: 2022-03-24
  Administered 2022-03-20: 1 via TOPICAL
  Filled 2022-03-20: qty 28

## 2022-03-20 MED ORDER — SODIUM CHLORIDE 1 G PO TABS
1.0000 g | ORAL_TABLET | Freq: Two times a day (BID) | ORAL | Status: DC
Start: 2022-03-20 — End: 2022-03-24
  Administered 2022-03-20 – 2022-03-24 (×8): 1 g via ORAL
  Filled 2022-03-20 (×8): qty 1

## 2022-03-20 NOTE — Progress Notes (Signed)
Is being still has this abdominal discomfort.  It is a burning type of pain.  Again, I suspect that this is probably from the celiac plexus.  Is not clear at all as to when or if a celiac block will be done.  If Gastroenterology can do it, he does not sound like he can be until next week.  Maybe, Interventional Radiology will be able to do this if they feel it is feasible.  I will try her on some gabapentin.  Maybe, this might help with some of the discomfort.  She is having no problems with the bathroom.  She is able to eat a little bit more.  She is on anticoagulation for the mesenteric vein thrombus.  She is on Eliquis.  I would suspect that if she is going to have a celiac plexus block, she not had to be off Eliquis.  We may have to add her onto heparin right now and anticipation for the celiac block.  Her labs look okay that have come back.  She is out of bed.  Maybe, if, interventional Radiology is not able to do the procedure this week, then she will be able to go home and have this done as an outpatient.  She has had no fever.  There has been no bleeding.  She has had no cough or shortness of breath.  Her vital signs were temperature 97.8.  Pulse 91.  Blood pressure 125/85.  Her lungs are clear bilaterally.  She has good air movement bilaterally.  Cardiac exam regular rate and rhythm.  Abdomen is soft.  There is some tenderness in the epigastric area.  She has decent bowel sounds.  There is no fluid wave.  There is no palpable liver or spleen tip.  I would think the issue right now is a timing for a celiac block.  Again, it sounds like Dr. Rush Landmark will not be able to do this until next week.  Again, maybe Interventional Radiology will be able to do this.  She is on Eliquis.  This is going to have to be stopped.  To get on heparin right now.  It would really be nice to try to get her home.  It be tough however if she is having these abdominal issues.  Maybe, gabapentin will help right  now.  I do appreciate the great care she is getting from everybody on 4 E.  Lattie Haw, MD  Michaelyn Barter 2:8-10

## 2022-03-20 NOTE — Progress Notes (Signed)
   03/20/22 2114  Provider Notification  Provider Name/Title Kristopher Oppenheim, DO  Date Provider Notified 03/20/22  Time Provider Notified 2114  Method of Notification  (secure chat)  Notification Reason Critical result  Test performed and critical result PTT >200  Date Critical Result Received 03/20/22  Time Critical Result Received 2101  Provider response No new orders  Date of Provider Response 03/20/22  Time of Provider Response 2115

## 2022-03-20 NOTE — Progress Notes (Addendum)
[facility] Progress Note  CC:  Metastatic pancreatic cancer, abdominal pain  Subjective: She continues to have upper abdominal pain.  Mild nausea.  No vomiting.  She passed a brown bowel movement earlier today.  No rectal bleeding or black stools.  No chest pain or shortness of breath.   Objective:  Vital signs in last 24 hours: Temp:  [97.8 F (36.6 C)-98.1 F (36.7 C)] 97.8 F (36.6 C) (08/02 0436) Pulse Rate:  [86-91] 91 (08/02 0436) Resp:  [18] 18 (08/02 0436) BP: (123-127)/(65-85) 127/85 (08/02 0436) SpO2:  [100 %] 100 % (08/02 0436) Last BM Date : 03/18/22  General: 66 year old female in no acute distress. Heart: Regular rate and rhythm, no murmurs Pulm: Breath sounds clear throughout. Abdomen: Soft, mild upper abdominal distention.  Mild to moderate tenderness throughout the left upper and lower abdomen without rebound or guarding.  Mild tenderness to the right upper and lower quadrants. Extremities: No lower extremity edema. Neurologic:  Alert and  oriented x 4. Grossly normal neurologically. Psych:  Alert and cooperative. Normal mood and affect.  Intake/Output from previous day: No intake/output data recorded. Intake/Output this shift: Total I/O In: 120 [P.O.:120] Out: -   Lab Results: Recent Labs    03/18/22 0332  WBC 10.3  HGB 9.8*  HCT 29.1*  PLT 335   BMET Recent Labs    03/18/22 0332 03/19/22 0606 03/20/22 0405  NA 127* 130* 126*  K 3.1* 3.9 4.0  CL 94* 96* 91*  CO2 '24 26 27  '$ GLUCOSE 147* 157* 164*  BUN <5* <5* <5*  CREATININE 0.52 0.51 0.53  CALCIUM 7.9* 7.8* 8.2*   LFT No results for input(s): "PROT", "ALBUMIN", "AST", "ALT", "ALKPHOS", "BILITOT", "BILIDIR", "IBILI" in the last 72 hours. PT/INR No results for input(s): "LABPROT", "INR" in the last 72 hours. Hepatitis Panel No results for input(s): "HEPBSAG", "HCVAB", "HEPAIGM", "HEPBIGM" in the last 72 hours.  No results found.  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 S/P palliative duodenal stent placement on 03/12/2022. 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 Celiac block requested by Dr. Marin Olp. Patient is now willing to have celiac block placed by IR if Dr. Rush Landmark is unable to do it within the next 1 to 2 days.  -EUS/celiac block scheduled with Dr. Rush Landmark at [facility] on Thursday 8/3. Hold heparin 6 hours prior to procedure.  -Pain management per the hospitalist -Ondansetron 4 mg p.o. or IV every 6 hours as needed -Continue Pantoprazole 40 mg twice daily -Await further recommendations per Dr. Candis Schatz   2) Hepatic abscess 01/2022, resolved   3) Chronic anticoagulation for SMV/SVC thrombus. Eliquis was dc'd 8/1, started on Heparin IV infusion  4) Normocytic anemia, likely due to pancreatic cancer.  No active GI bleeding.  Iron level 38 and vitamin B12 846 on 03/08/2022. -CBC in a.m.  5) Hyponatremia. Na+ 126. -Management per the hospitalist     Principal Problem:   Hyponatremia Active Problems:   Hypertension   Hypokalemia   Cirrhosis of liver (HCC)   Malignant neoplasm of pancreas (HCC)   Pancreatic insufficiency   Mesenteric vein thrombosis (HCC)   Acute thrombosis of splenic vein   Intractable nausea and vomiting   Abdominal pain, epigastric   Hypomagnesemia   Duodenal obstruction, acquired   Biliary obstruction     LOS: 15 days   Colleen M  Kennedy-Smith  03/20/2022, 11:22 AM   -------------------------------------------------------------- I have reviewed the chart but not examined the patient.  The patient was sleeping soundly when I came to see her today.  As there were no urgent issues to address with this patient, I elected to let her sleep. I agree with the APP's note, impression and recommendations  The patient is tentatively scheduled for a  celiac plexus block tomorrow afternoon with Dr. Rush Landmark, but there is potential that her procedure may be delayed, as there are more urgent procedures that are expected to arrive from an outside hospital tomorrow.  Please keep patient NPO for possible procedure.  Santhosh Gulino E. Candis Schatz, MD [facility]

## 2022-03-20 NOTE — Progress Notes (Addendum)
ANTICOAGULATION CONSULT NOTE  Pharmacy Consult for IV heparin Indication: splenic/SMV thrombosis on apixaban PTA  Allergies  Allergen Reactions   Lisinopril Swelling and Other (See Comments)    Facial and upper lip    Patient Measurements: Height: '5\' 3"'$  (160 cm) Weight: 68 kg (150 lb) IBW/kg (Calculated) : 52.4 Heparin Dosing Weight: 66 kg  Vital Signs: Temp: 98.4 F (36.9 C) (08/02 1714) Temp Source: Oral (08/02 1714) BP: 129/75 (08/02 1714) Pulse Rate: 82 (08/02 1714)  Labs: Recent Labs    03/18/22 0332 03/19/22 0606 03/20/22 0405 03/20/22 1935  HGB 9.8*  --   --   --   HCT 29.1*  --   --   --   PLT 335  --   --   --   APTT  --   --   --  >200*  HEPARINUNFRC  --   --   --  >1.10*  CREATININE 0.52 0.51 0.53  --      Estimated Creatinine Clearance: 64.9 mL/min (by C-G formula based on SCr of 0.53 mg/dL).   Assessment: 86 yoF with PMH cirrhosis, HepC, recurrent pancreatic cancer on chemo, portal vein and mesenteric thrombosis (off Apixaban x 2 weeks since she was not aware to continue after start pack) admitted for electrolyte derangements.  May need esophageal/duodenal stenting and Apixaban held for potential GI procedures. Pharmacy consulted to dose IV heparin in the meantime. Baseline CBC: Hg low at 9.8, pltc WNL  Significant events: Prior anticoagulation: Apixaban 5 mg PO BID, LD 7/22 @ 0813 7/25: IV heparin held at 0600 for EGD/duodenal stent placement. Post-procedure, IV heparin resumed without bolus at 1700 per GI recommendations.  7/28: Apixaban '10mg'$  PO BID resumed.   8/2: Hold apixaban for possible abdominal procedure this week  Today, 03/20/2022:  CBC: Hgb 9.8-low/stable, plts WNL (from 7/31)  Last dose apixaban: 8/1 @ 2135.  Will assume heparin level elevated since has been on apixaban for past several days.  Dose heparin using aPTT levels until correlates with IV heparin.  First heparin level > 1.1 as expected with prior apixban  First aPTT > 200-  above goal Heparin running via peripheral  IV  & lab drawn via PAC per RN RN reports no bleeding noted  Goal of Therapy: aPTT 66-102 sec Heparin level 0.3-0.7 units/ml Monitor platelets by anticoagulation protocol: Yes  Plan: Hold heparin x 1 hour then resume at lower rate of 1500 units/hr and check 8hr aPTT Daily CBC & aPTT & heparin level Monitor closely for s/sx of bleeding Heparin to be held 8/3 at 09 am for procedure  Eudelia Bunch, Pharm.D 03/20/2022 9:17 PM

## 2022-03-20 NOTE — TOC Initial Note (Signed)
Transition of Care [facility]) - Initial/Assessment Note    Patient Details  Name: Melanie Alvarez MRN: [id number] Date of Birth: 03/29/1956  [facility] CM/SW Contact:    Dessa Phi, RN Phone Number: 03/20/2022, 12:21 PM  Clinical Narrative: continue to monitor for d/c plans. Noted GI following possible procedure.                  Expected Discharge Plan: Home/Self Care Barriers to Discharge: Continued Medical Work up   Patient Goals and CMS Choice Patient states their goals for this hospitalization and ongoing recovery are:: Home CMS Medicare.gov Compare Post Acute Care list provided to:: Patient Choice offered to / list presented to : Patient  Expected Discharge Plan and Services Expected Discharge Plan: Home/Self Care   Discharge Planning Services: CM Consult   Living arrangements for the past 2 months: Single Family Home                                      Prior Living Arrangements/Services Living arrangements for the past 2 months: Single Family Home Lives with:: Spouse Patient language and need for interpreter reviewed:: Yes Do you feel safe going back to the place where you live?: Yes      Need for Family Participation in Patient Care: Yes (Comment) Care giver support system in place?: Yes (comment)   Criminal Activity/Legal Involvement Pertinent to Current Situation/Hospitalization: No - Comment as needed  Activities of Daily Living Home Assistive Devices/Equipment: Walker (specify type) ADL Screening (condition at time of admission) Patient's cognitive ability adequate to safely complete daily activities?: Yes Is the patient deaf or have difficulty hearing?: No Does the patient have difficulty seeing, even when wearing glasses/contacts?: No Does the patient have difficulty concentrating, remembering, or making decisions?: No Patient able to express need for assistance with ADLs?: Yes Does the patient have difficulty dressing or  bathing?: No Independently performs ADLs?: Yes (appropriate for developmental age) Does the patient have difficulty walking or climbing stairs?: No Weakness of Legs: None Weakness of Arms/Hands: None  Permission Sought/Granted Permission sought to share information with : Case Manager Permission granted to share information with : Yes, Verbal Permission Granted  Share Information with NAME: Case Manager           Emotional Assessment Appearance:: Appears stated age Attitude/Demeanor/Rapport: Gracious Affect (typically observed): Accepting Orientation: : Oriented to Self, Oriented to Place, Oriented to  Time, Oriented to Situation Alcohol / Substance Use: Not Applicable Psych Involvement: No (comment)  Admission diagnosis:  Dehydration [E86.0] Hypokalemia [E87.6] Hyponatremia [E87.1] Intractable nausea and vomiting [R11.2] Diarrhea, unspecified type [R19.7] Patient Active Problem List   Diagnosis Date Noted   Biliary obstruction 03/09/2022   Duodenal obstruction, acquired    Hypomagnesemia 03/06/2022   Abdominal pain, epigastric    Mesenteric vein thrombosis (HCC) 03/05/2022   Acute thrombosis of splenic vein 03/05/2022   Intractable nausea and vomiting 03/05/2022   Hepatic abscess 01/16/2022   Hepatic lesion 01/15/2022   Pancreatic insufficiency 01/15/2022   Chronic pain syndrome 01/15/2022   Portal vein thrombosis 01/15/2022   Dyspnea 01/15/2022   Constipation 01/15/2022   Left lower quadrant abdominal pain 12/04/2021   History of pancreatic cancer 11/27/2021   Aortic atherosclerosis ([place]) 11/27/2021   Ankle pain 09/04/2021   Foot pain 09/04/2021   Dizziness 05/14/2021   Goals of care, counseling/discussion 09/29/2019   Hyponatremia 09/20/2019   Malignant neoplasm  of pancreas ([place]) 09/20/2019   Alcohol cessation counseling 08/11/2019   Exophthalmos of left eye 05/25/2019   Hyperuricemia 10/06/2018   Mixed hyperlipidemia 04/06/2018   Cirrhosis of liver ([place])  12/26/2017   Need for prophylactic vaccination and inoculation against viral hepatitis 12/26/2017   Gouty arthritis 10/20/2017   Hypokalemia 10/20/2017   Hypertension 08/13/2012   Tobacco abuse 08/13/2012   Chronic hepatitis C without hepatic coma ([place]) 08/13/2012   DM (diabetes mellitus) ([place])    Class 2 severe obesity due to excess calories with serious comorbidity and body mass index (BMI) of 37.0 to 37.9 in adult [facility])    PCP:  Flossie Buffy, NP Pharmacy:   [place], [place] [address] Phone: [phone] Fax: [place] [address] Phone: [phone] Fax: [phone]     Social Determinants of Health (SDOH) Interventions    Readmission Risk Interventions    03/06/2022    1:57 PM  Readmission Risk Prevention Plan  Transportation Screening Complete  Medication Review (RN Care Manager) Complete  PCP or Specialist appointment within 3-5 days of discharge Complete  HRI or [place] Complete  SW Recovery Care/Counseling Consult Complete  [place] Not Applicable

## 2022-03-20 NOTE — Progress Notes (Signed)
G PROGRESS NOTE  Melanie Alvarez UVO:[id number] DOB: [birth year]   PCP: Flossie Buffy, NP  DOA: 03/04/2022 LOS: 34   Brief Narrative / Interim history: 66 year old F with PMH of liver cirrhosis, treated hep C, recurrent pancreatic cancer with insufficiency on systemic chemo, portal vein and mesenteric vein thrombosis on Eliquis, history of hepatic abscess, DM-2, HTN and HLD presented to her gastroenterologist with nausea, vomiting and abdominal pain.  Lab work at the GI office concerning for hyponatremia and hypokalemia, and patient was directed to ED.  Of note, patient was off Eliquis for 2 weeks.  She did not know she was supposed to continue after starter pack.  In ED, Na 122.  K2.8.  CT abdomen and pelvis showed no acute finding but stable mild intra and extrahepatic biliary ductal dilation with palliative biliary stent in place, stable pancreatic mass with occlusion of the mesenteric and splenic vein.    Electrolytes improved.  Still with intractable nausea, vomiting and abdominal pain.  Oncology and GI consulted.  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  She had duodenal stent placed on 7/25.  GI symptoms improved.  GI gave recommendation and signed off.  Palliative medicine seen and have been followed recently.  Due to ongoing abdominal pain, Dr. Marin Olp concerned about celiac plexus invasion by cancer, recommended celiac plexus block, [place] [facility] consulted back again.  Subjective: Continues to have upper abdominal pain rating it at 9 out of 10 in intensity.  Some nausea but no vomiting.  No shortness of breath or chest pains.  Objective: Vitals:   03/19/22 0706 03/19/22 1530 03/19/22 2042 03/20/22 0436  BP: (!) 120/90 123/73 123/65 127/85  Pulse: 92 86 88 91  Resp: '20 18 18 18  '$ Temp: 97.8 F (36.6 C)  98.1 F (36.7 C) 97.8  F (36.6 C)  TempSrc: Oral  Oral Oral  SpO2: 100% 100% 100% 100%  Weight:      Height:        Examination:  General appearance: Awake alert.  In no distress Resp: Clear to auscultation bilaterally.  Normal effort Cardio: S1-S2 is normal regular.  No S3-S4.  No rubs murmurs or bruit GI: Abdomen is soft.  Tender in the epigastric area Extremities: No edema.  Full range of motion of lower extremities. Neurologic: Alert and oriented x3.  No focal neurological deficits.      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 7/25-duodenal stent placement    Assessment and plan:  Intractable nausea/vomiting/abdominal pain/diarrhea:  EGD with duodenitis and partial duodenal obstruction due to pancreatic mass.  CT abdomen without acute finding but chronic intra and extra biliary ductal dilation with pancreatic mass.  Underwent duodenal stent placement on 03/12/2022.   Continues to have symptoms.  Pain medications were adjusted.  She is currently noted to be on fentanyl patch and hydromorphone as needed. Oncology concerned that there may be celiac plexus invasion by her cancer.  Recommend celiac plexus block.  GI consulted.  GI is unsure as to when they can actually do the procedure.  Oncology recommends consulting IR which has been done.  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  Fluctuating.  Noted to be 126 this morning.  Continue fluid restriction.  Start salt tablets.  Potassium is normal.  Magnesium is 1.7.  Portal and mesenteric vein thrombosis:  Recent diagnosis.  Patient thought she did not need Eliquis and stopped taking after starter pack. Started back on Eliquis but then changed back to IV heparin due to possible need for celiac block.   Malignant neoplasm of pancreas/pancreatic  insufficiency Followed by Dr. Marin Olp.  On systemic chemo. -GI discontinued Creon. -Pain management as above.    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 -Continue monitoring  Controlled NIDDM-2 with hyperglycemia:  A1c 5.7%.  On glipizide and metformin at home.  Continue to monitor CBGs.  SSI  History of hepatic abscess Treated and resolved.  Status post drain removal on 6/29.  Essential hypertension:  Stable on amlodipine.   Discontinued Avapro with the hope to help hyponatremia  Normocytic anemia:  Anemia panel suggests anemia of chronic disease.  No overt blood loss noted. -Periodically follow CBC.  Minimize blood draws.  Stable.  Leukocytosis:  Likely demargination and malignancy versus infection.  Resolved.   DVT prophylaxis:   On IV heparin Code Status: Full code Family Communication: Discussed with patient  Disposition: Hopefully return home when improved.  Status is: Inpatient Remains inpatient appropriate because: Abdominal pain due to pancreatic cancer and duodenal stenosis.  GI now consulted for possible celiac plexus block, timing yet to be determined.   Consultants:  Oncology Gastroenterology Palliative medicine Interventional radiology  Sch Meds:  Scheduled Meds:  amLODipine  10 mg Oral QHS   Chlorhexidine Gluconate Cloth  6 each Topical Daily   fentaNYL  1 patch Transdermal Q72H   gabapentin  400 mg Oral TID   pantoprazole  40 mg Oral BID   sodium chloride flush  10-40 mL Intracatheter Q12H   Continuous Infusions:  sodium chloride     heparin 1,750 Units/hr (03/20/22 0939)   PRN Meds:.sodium chloride, alum & mag hydroxide-simeth, HYDROmorphone, labetalol, lip balm, liver oil-zinc oxide, loperamide, ondansetron, mouth rinse, prochlorperazine, sodium chloride flush  Antimicrobials: Anti-infectives (From admission, onward)    Start     Dose/Rate Route Frequency Ordered Stop   03/12/22 1100  ciprofloxacin (CIPRO) IVPB 400 mg         400 mg 200 mL/hr over 60 Minutes Intravenous  Once 03/12/22 1058 03/12/22 1226         CBC: Recent Labs  Lab 03/14/22 0330 03/15/22 0225 03/16/22 0338 03/18/22 0332  WBC 22.9* 20.3* 11.7* 10.3  NEUTROABS  --  16.3*  --   --   HGB 9.5* 9.8* 9.3* 9.8*  HCT 28.8* 29.6* 27.8* 29.1*  MCV 86.5 86.3 85.8 84.6  PLT 259 297 349 335    BMP &GFR Recent Labs  Lab 03/14/22 0330 03/15/22 0225 03/16/22 0338 03/18/22 0332 03/19/22 0606 03/20/22 0405  NA 128* 129* 130* 127* 130* 126*  K 4.2 3.9 3.6 3.1* 3.9 4.0  CL 100 103 102 94* 96* 91*  CO2 22 19* '22 24 26 27  '$ GLUCOSE 125* 112* 113* 147* 157* 164*  BUN <5* <5* <5* <5* <5* <5*  CREATININE 0.47 0.44 0.41* 0.52 0.51 0.53  CALCIUM 7.4* 7.6* 7.9* 7.9* 7.8* 8.2*  MG 1.7 1.6* 1.7 1.4* 1.7 1.7  PHOS 2.2* 2.4* 3.0 4.2  --   --     Estimated Creatinine Clearance: 64.9 mL/min (by C-G formula based on SCr of 0.53 mg/dL). Liver & Pancreas: Recent Labs  Lab 03/14/22 0330 03/15/22 0225 03/16/22 0338  AST  --  13*  --   ALT  --  11  --   ALKPHOS  --  78  --   BILITOT  --  0.7  --   PROT  --  5.9*  --   ALBUMIN 2.0* 2.3* 2.1*     Recent Labs  Lab 03/19/22 0727 03/19/22 1152 03/19/22 1643 03/19/22 2039 03/20/22 0728  GLUCAP 156* 242* 180* 169* 158*       Microbiology: No results found for this or any previous visit (from the past 240 hour(s)).  Radiology Studies: No results found.   Bonnielee Haff, MD,

## 2022-03-20 NOTE — Progress Notes (Signed)
[place] for IV heparin Indication: splenic/SMV thrombosis on apixaban PTA  Allergies  Allergen Reactions   Lisinopril Swelling and Other (See Comments)    Facial and upper lip    Patient Measurements: Height: '5\' 3"'$  (160 cm) Weight: 68 kg (150 lb) IBW/kg (Calculated) : 52.4 Heparin Dosing Weight: 66 kg  Vital Signs: Temp: 97.8 F (36.6 C) (08/02 0436) Temp Source: Oral (08/02 0436) BP: 127/85 (08/02 0436) Pulse Rate: 91 (08/02 0436)  Labs: Recent Labs    03/18/22 0332 03/19/22 0606 03/20/22 0405  HGB 9.8*  --   --   HCT 29.1*  --   --   PLT 335  --   --   CREATININE 0.52 0.51 0.53     Estimated Creatinine Clearance: 64.9 mL/min (by C-G formula based on SCr of 0.53 mg/dL).   Assessment: 89 yoF with PMH cirrhosis, HepC, recurrent pancreatic cancer on chemo, portal vein and mesenteric thrombosis (off Apixaban x 2 weeks since she was not aware to continue after start pack) admitted for electrolyte derangements.  May need esophageal/duodenal stenting and Apixaban held for potential GI procedures. Pharmacy consulted to dose IV heparin in the meantime. Baseline CBC: Hg low at 9.8, pltc WNL  Significant events: Prior anticoagulation: Apixaban 5 mg PO BID, LD 7/22 @ 0813 7/25: IV heparin held at 0600 for EGD/duodenal stent placement. Post-procedure, IV heparin resumed without bolus at 1700 per GI recommendations.  7/28: Apixaban '10mg'$  PO BID resumed.   8/2: Hold apixaban for possible abdominal procedure this week  Today, 03/20/2022: CBC: Hgb 9.8-low/stable, plts WNL Last dose apixaban: 8/1 @ 2135.  Will assume heparin level elevated since has been on apixaban for past several days.  Dose heparin using aPTT levels until correlates with IV heparin.   Goal of Therapy: aPTT 66-102 sec Heparin level 0.3-0.7 units/ml Monitor platelets by anticoagulation protocol: Yes  Plan: At 9am, Start IV heparin infusion at 1750 units/hr Check aPTT  and heparin level at 1700 Daily CBC & heparin level Monitor closely for s/sx of bleeding  Thank you for allowing pharmacy to be a part of this patient's care.  Netta Cedars, PharmD, BCPS 03/20/2022 6:40 AM

## 2022-03-21 DIAGNOSIS — I10 Essential (primary) hypertension: Secondary | ICD-10-CM | POA: Diagnosis not present

## 2022-03-21 DIAGNOSIS — I82890 Acute embolism and thrombosis of other specified veins: Secondary | ICD-10-CM | POA: Diagnosis not present

## 2022-03-21 DIAGNOSIS — C250 Malignant neoplasm of head of pancreas: Secondary | ICD-10-CM | POA: Diagnosis not present

## 2022-03-21 DIAGNOSIS — E871 Hypo-osmolality and hyponatremia: Secondary | ICD-10-CM | POA: Diagnosis not present

## 2022-03-21 LAB — GLUCOSE, CAPILLARY
Glucose-Capillary: 151 mg/dL — ABNORMAL HIGH (ref 70–99)
Glucose-Capillary: 205 mg/dL — ABNORMAL HIGH (ref 70–99)
Glucose-Capillary: 240 mg/dL — ABNORMAL HIGH (ref 70–99)
Glucose-Capillary: 192 mg/dL — ABNORMAL HIGH (ref 70–99)

## 2022-03-21 LAB — CBC
HCT: 29.2 % — ABNORMAL LOW (ref 36.0–46.0)
Hemoglobin: 9.8 g/dL — ABNORMAL LOW (ref 12.0–15.0)
MCH: 28.4 pg (ref 26.0–34.0)
MCHC: 33.6 g/dL (ref 30.0–36.0)
MCV: 84.6 fL (ref 80.0–100.0)
Platelets: 319 10*3/uL (ref 150–400)
RBC: 3.45 MIL/uL — ABNORMAL LOW (ref 3.87–5.11)
RDW: 15.1 % (ref 11.5–15.5)
WBC: 11.4 10*3/uL — ABNORMAL HIGH (ref 4.0–10.5)
nRBC: 0 % (ref 0.0–0.2)

## 2022-03-21 LAB — BASIC METABOLIC PANEL
Anion gap: 5 (ref 5–15)
BUN: 5 mg/dL — ABNORMAL LOW (ref 8–23)
CO2: 28 mmol/L (ref 22–32)
Calcium: 7.9 mg/dL — ABNORMAL LOW (ref 8.9–10.3)
Chloride: 98 mmol/L (ref 98–111)
Creatinine, Ser: 0.59 mg/dL (ref 0.44–1.00)
GFR, Estimated: >60 mL/min (ref >60)
Glucose, Bld: 146 mg/dL — ABNORMAL HIGH (ref 70–99)
Potassium: 3.3 mmol/L — ABNORMAL LOW (ref 3.5–5.1)
Sodium: 131 mmol/L — ABNORMAL LOW (ref 135–145)

## 2022-03-21 LAB — APTT
aPTT: >200 seconds (ref 24–36)
aPTT: >200 seconds (ref 24–36)

## 2022-03-21 LAB — HEPARIN LEVEL (UNFRACTIONATED): Heparin Unfractionated: 0.73 IU/mL — ABNORMAL HIGH (ref 0.30–0.70)

## 2022-03-21 MED ORDER — HEPARIN (PORCINE) 25000 UT/250ML-% IV SOLN: 800.0000 [IU]/h | INTRAVENOUS | Status: DC

## 2022-03-21 MED ORDER — POTASSIUM CHLORIDE CRYS ER 20 MEQ PO TBCR
40.0000 meq | EXTENDED_RELEASE_TABLET | Freq: Two times a day (BID) | ORAL | Status: AC
  Administered 2022-03-21 (×2): 40 meq via ORAL
  Filled 2022-03-21 (×2): qty 2

## 2022-03-21 MED ORDER — HEPARIN (PORCINE) 25000 UT/250ML-% IV SOLN
1050.0000 [IU]/h | INTRAVENOUS | Status: DC
  Administered 2022-03-21: 1050 [IU]/h via INTRAVENOUS

## 2022-03-21 NOTE — Progress Notes (Signed)
It looks like she is going for the celiac block today.  Surprisingly, she has no pain this morning.  She says that the gabapentin that I put her on has helped.  I think this goes along with the fact that she has a neuropathic component of the pain.  This believe would make the celiac block a good thing to do.  She had little bit of diarrhea this morning..  She is on heparin right now.  Once the procedure gets completed, then she can be switched back over to Eliquis.  She has had no fever.  There is been no cough or shortness of breath.  She has had no leg swelling.  She is out of bed walking around a little bit.  Her vital signs are stable.  Temperature 98.3.  Pulse 85.  Blood pressure 117/71.  Her abdomen is soft.  Bowel sounds are present.  There is no fluid wave.  She has still a little bit of guarding in the upper abdomen.  Lungs are clear.  Cardiac exam regular rate and rhythm.  We will see what the celiac block does.  I am glad that the Neurontin has helped her out.  Hopefully, she will be able to need as much in the way of her short acting pain medication.  She is on the fentanyl patch.  If all goes well today, it may be, she can then be discharged to home tomorrow.  Lattie Haw, MD  Psalms 59:16

## 2022-03-21 NOTE — Progress Notes (Addendum)
[place] for IV heparin Indication: splenic/SMV thrombosis on apixaban PTA  Patient Measurements: Height: '5\' 3"'$  (160 cm) Weight: 68 kg (150 lb) IBW/kg (Calculated) : 52.4 Heparin Dosing Weight: 66 kg   Assessment: 16 yoF with PMH cirrhosis, HepC, recurrent pancreatic cancer on chemo, portal vein and mesenteric thrombosis (off Apixaban x 2 weeks since she was not aware to continue after start pack) admitted for electrolyte derangements.  PTA Apixaban held for potential GI procedures.   Today, 03/21/2022 - update:  aPTT: >200, supratherapeutic and not decreased despite previous 2 hour hold and decrease to 1050 units/hr Large delay in aPTT results.  Labs due at 1630, but not drawn until after 1830, and not resulted until I called lab at ~2015. Rate confirmed with RN Heparin running via peripheral  IV  & lab drawn via PAC    Heparin level 0.73, remains supratherapeutic but decreased.   Last dose apixaban: 8/1 @ 2135.  Heparin level may be falsely elevated d/t recent DOAC use.  Dose heparin using aPTT until levels correlate.   RN reports no bleeding or IV complications, pauses, or interruptions.    Goal of Therapy: aPTT 66-102 sec Heparin level 0.3-0.7 units/ml Monitor platelets by anticoagulation protocol: Yes  Plan: Hold heparin x 1 hour then resume at lower rate of 800 units/hr  Re-check 6hr aPTT, heparin level Daily CBC & aPTT & heparin level Heparin to be held 8/4 at 06:00 am for procedure  Gretta Arab PharmD, BCPS Clinical Pharmacist [facility] main pharmacy [phone] 03/21/2022 4:04 PM

## 2022-03-21 NOTE — Progress Notes (Signed)
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  Lab work at the GI office concerning for hyponatremia and hypokalemia, and patient was directed to ED.  Of note, patient was off Eliquis for 2 weeks.  She did not know she was supposed to continue after starter pack.  In ED, Na 122.  K2.8.  CT abdomen and pelvis showed no acute finding but stable mild intra and extrahepatic biliary ductal dilation with palliative biliary stent in place, stable pancreatic mass with occlusion of the mesenteric and splenic vein.    Electrolytes improved.  Still with intractable nausea, vomiting and abdominal pain.  Oncology and GI consulted.  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  She had duodenal stent placed on 7/25.  GI symptoms improved.  GI gave recommendation and signed off.  Palliative medicine seen and have been followed recently.  Due to ongoing abdominal pain, Dr. Marin Olp concerned about celiac plexus invasion by cancer, recommended celiac plexus block, [facility] consulted back again.  Subjective: Patient mentions that her pain is not as severe this morning compared to yesterday.  Tolerating her diet.  Having bowel movements.  Some of them are loose.  Objective: Vitals:   03/20/22 1142 03/20/22 1714 03/20/22 2154 03/21/22 0619  BP: 118/78 129/75 114/73 117/71  Pulse: 93 82 91 85  Resp: '19 18 18 18  '$ Temp: 98.2 F (36.8 C) 98.4 F (36.9 C)  98.3 F (36.8 C) 98.3 F (36.8 C)  TempSrc: Oral Oral Oral Oral  SpO2: 100% 100% 100% 98%  Weight:      Height:        Examination:  General appearance: Awake alert.  In no distress Resp: Clear to auscultation bilaterally.  Normal effort Cardio: S1-S2 is normal regular.  No S3-S4.  No rubs murmurs or bruit GI: Abdomen is soft.  Tender in the epigastric area without any rebound rigidity or guarding.  Bowel sounds present. Extremities: No edema.  Full range of motion of lower extremities. Neurologic: Alert and oriented x3.  No focal neurological deficits.     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 7/25-duodenal stent placement    Assessment and plan:  Intractable nausea/vomiting/abdominal pain/diarrhea:  EGD with duodenitis and partial duodenal obstruction due to pancreatic mass.  CT abdomen without acute finding but chronic intra and extra biliary ductal dilation with pancreatic mass.  Underwent duodenal stent placement on 03/12/2022.   Continued to have symptoms.  Pain medications were adjusted.  She is currently noted to be on fentanyl patch and hydromorphone as needed. Oncology concerned that there may be celiac plexus invasion by her cancer.  Recommend celiac plexus block.  GI may be able to do this procedure this afternoon according to their notes from yesterday.  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  Fluctuating.  Noted to be 126 yesterday.  Started on salt tablets.  Improved 231  today. Replace potassium.  Magnesium was 1.7 yesterday.  Recheck tomorrow.    Portal and mesenteric vein thrombosis:  Recent diagnosis.  Patient thought she did not need Eliquis and stopped taking after starter pack. Started back on Eliquis but then changed back to IV heparin due to need for  celiac block.   Malignant neoplasm of pancreas/pancreatic insufficiency Followed by Dr. Marin Olp.  On systemic chemo. -GI discontinued Creon. -Pain management as above.    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 -Continue monitoring  Controlled NIDDM-2 with hyperglycemia:  A1c 5.7%.  On glipizide and metformin at home.  Continue to monitor CBGs.  SSI  History of hepatic abscess Treated and resolved.  Status post drain removal on 6/29.  Essential hypertension:  Stable on amlodipine.   Discontinued Avapro with the hope to help hyponatremia Blood pressure is reasonably well controlled.  Normocytic anemia:  Anemia panel suggests anemia of chronic disease.  No overt blood loss noted. Hemoglobin is stable for the most part.  Leukocytosis:  Likely demargination and malignancy versus infection.  Resolved.   DVT prophylaxis:   On IV heparin Code Status: Full code Family Communication: Discussed with patient  Disposition: Hopefully return home when improved.  Status is: Inpatient Remains inpatient appropriate because: Abdominal pain due to pancreatic cancer and duodenal stenosis.  GI now consulted for possible celiac plexus block, timing yet to be determined.   Consultants:  Oncology Gastroenterology Palliative medicine Interventional radiology  Sch Meds:  Scheduled Meds:  amLODipine  10 mg Oral QHS   Chlorhexidine Gluconate Cloth  6 each Topical Daily   fentaNYL  1 patch Transdermal Q72H   gabapentin  400 mg Oral TID   pantoprazole  40 mg Oral BID   potassium chloride  40 mEq Oral BID   sodium chloride flush  10-40 mL Intracatheter Q12H   sodium chloride  1 g Oral BID WC   Continuous Infusions:  sodium chloride     PRN Meds:.sodium chloride, alum & mag hydroxide-simeth, hydrocortisone cream, HYDROmorphone, labetalol, lip balm, liver oil-zinc oxide, loperamide, ondansetron, mouth rinse, prochlorperazine, sodium chloride  flush  Antimicrobials: Anti-infectives (From admission, onward)    Start     Dose/Rate Route Frequency Ordered Stop   03/12/22 1100  ciprofloxacin (CIPRO) IVPB 400 mg        400 mg 200 mL/hr over 60 Minutes Intravenous  Once 03/12/22 1058 03/12/22 1226         CBC: Recent Labs  Lab 03/15/22 0225 03/16/22 0338 03/18/22 0332 03/21/22 0617  WBC 20.3* 11.7* 10.3 11.4*  NEUTROABS 16.3*  --   --   --   HGB 9.8* 9.3* 9.8* 9.8*  HCT 29.6* 27.8* 29.1* 29.2*  MCV 86.3 85.8 84.6 84.6  PLT 297 349 335 319    BMP &GFR Recent Labs  Lab 03/15/22 0225 03/16/22 0338 03/18/22 0332 03/19/22 0606 03/20/22 0405 03/21/22 0617  NA 129* 130* 127* 130* 126* 131*  K 3.9 3.6 3.1* 3.9 4.0 3.3*  CL 103 102 94* 96* 91* 98  CO2 19* '22 24 26 27 28  '$ GLUCOSE 112* 113* 147* 157* 164* 146*  BUN <5* <5* <5* <5* <5* 5*  CREATININE 0.44 0.41* 0.52 0.51 0.53 0.59  CALCIUM 7.6* 7.9* 7.9* 7.8* 8.2* 7.9*  MG 1.6* 1.7 1.4* 1.7 1.7  --   PHOS 2.4* 3.0 4.2  --   --   --     Estimated Creatinine Clearance: 64.9 mL/min (by C-G formula based on SCr of 0.59 mg/dL).  Liver & Pancreas: Recent Labs  Lab 03/15/22 0225 03/16/22 0338  AST 13*  --   ALT 11  --   ALKPHOS 78  --   BILITOT 0.7  --   PROT 5.9*  --   ALBUMIN 2.3* 2.1*     Recent Labs  Lab 03/20/22 0728 03/20/22 1139 03/20/22 1711 03/20/22 2157 03/21/22 0723  GLUCAP 158* 228* 126* 228* 151*       Microbiology: No results found for this or any previous visit (from the past 240 hour(s)).  Radiology Studies: No results found.   Bonnielee Haff, MD

## 2022-03-21 NOTE — Anesthesia Preprocedure Evaluation (Addendum)
Anesthesia Evaluation  Patient identified by MRN, date of birth, ID band  Reviewed: Allergy & Precautions, NPO status , Patient's Chart, lab work & pertinent test results, reviewed documented beta blocker date and time   Airway Mallampati: II  TM Distance: >3 FB Neck ROM: Full    Dental  (+) Dental Advisory Given, Teeth Intact   Pulmonary shortness of breath and with exertion, former smoker,  Quit smoking 2021   Pulmonary exam normal breath sounds clear to auscultation       Cardiovascular hypertension (145/72 in preop), Pt. on medications and Pt. on home beta blockers +CHF (LVEF 40-45%)  Normal cardiovascular exam+ Valvular Problems/Murmurs (mild AS) AS  Rhythm:Regular Rate:Normal  Last echo 12/2021 1. Left ventricular ejection fraction, by estimation, is 40 to 45%. The  left ventricle has mildly decreased function. The left ventricle  demonstrates global hypokinesis. Left ventricular diastolic parameters are  consistent with Grade I diastolic  dysfunction (impaired relaxation). Elevated left ventricular end-diastolic  pressure.  2. Right ventricular systolic function is normal. The right ventricular  size is normal.  3. The mitral valve is normal in structure. Trivial mitral valve  regurgitation. No evidence of mitral stenosis.  4. The aortic valve is tricuspid. Aortic valve regurgitation is not  visualized. Mild aortic valve stenosis. Aortic valve area, by VTI measures  1.62 cm. Aortic valve mean gradient measures 11.0 mmHg. Aortic valve Vmax  measures 2.28 m/s.  5. The inferior vena cava is normal in size with greater than 50% respiratory variability, suggesting right atrial pressure of 3 mmHg.    Neuro/Psych negative neurological ROS  negative psych ROS   GI/Hepatic (+) Hepatitis -, CPancreatic ca   Endo/Other  diabetes, Well Controlled, Type 2, Oral Hypoglycemic Agents  Renal/GU negative Renal ROS  negative  genitourinary   Musculoskeletal  (+) Arthritis , Osteoarthritis,    Abdominal   Peds  Hematology  (+) Blood dyscrasia, anemia , Hb 9.8   Anesthesia Other Findings Chronic cancer pain  Reproductive/Obstetrics negative OB ROS                            Anesthesia Physical Anesthesia Plan  ASA: 3  Anesthesia Plan: MAC   Post-op Pain Management:    Induction:   PONV Risk Score and Plan: 2 and Propofol infusion and TIVA  Airway Management Planned: Natural Airway and Simple Face Mask  Additional Equipment: None  Intra-op Plan:   Post-operative Plan:   Informed Consent: I have reviewed the patients History and Physical, chart, labs and discussed the procedure including the risks, benefits and alternatives for the proposed anesthesia with the patient or authorized representative who has indicated his/her understanding and acceptance.     Dental advisory given  Plan Discussed with: CRNA  Anesthesia Plan Comments:        Anesthesia Quick Evaluation

## 2022-03-21 NOTE — Plan of Care (Signed)
  Problem: Clinical Measurements: Goal: Diagnostic test results will improve Outcome: Progressing Goal: Respiratory complications will improve Outcome: Progressing Goal: Cardiovascular complication will be avoided Outcome: Progressing   Problem: Activity: Goal: Risk for activity intolerance will decrease Outcome: Progressing   Problem: Nutrition: Goal: Adequate nutrition will be maintained Outcome: Progressing   Problem: Pain Managment: Goal: General experience of comfort will improve Outcome: Progressing   Problem: Safety: Goal: Ability to remain free from injury will improve Outcome: Progressing

## 2022-03-21 NOTE — Progress Notes (Addendum)
Progress Note  Primary GI: Dr. Rush Landmark   Subjective  Chief Complaint:Metastatic pancreatic cancer, abdominal pain  Patient sitting at the window, states pain has improved slightly today.  Denies nausea and vomiting.  He is currently n.p.o. awaiting for possible celiac block/EUS. Past brown soft stool yesterday    Objective   Vital signs in last 24 hours: Temp:  [98.2 F (36.8 C)-98.4 F (36.9 C)] 98.3 F (36.8 C) (08/03 0619) Pulse Rate:  [82-93] 85 (08/03 0619) Resp:  [18-19] 18 (08/03 0619) BP: (114-129)/(71-78) 117/71 (08/03 0619) SpO2:  [98 %-100 %] 98 % (08/03 0619) Last BM Date : 03/20/22 Last BM recorded by nurses in past 5 days Stool Type: Type 7 (Liquid consistency with no solid pieces) (03/20/2022 10:30 PM)  General:   female in no acute distress  Heart:  Regular rate and rhythm; no murmurs Pulm: Clear anteriorly; no wheezing Abdomen:  Soft, Obese AB, Active bowel sounds. mild tenderness in the upper abdomen. With guarding and Without rebound, No organomegaly appreciated. Extremities:  without  edema. Neurologic:  Alert and  oriented x4;  No focal deficits.  Psych:  Cooperative. Normal mood and affect.  Intake/Output from previous day: 08/02 0701 - 08/03 0700 In: 551.7 [P.O.:360; I.V.:191.7] Out: -  Intake/Output this shift: No intake/output data recorded.  Studies/Results: No results found.  Lab Results: Recent Labs    03/21/22 0617  WBC 11.4*  HGB 9.8*  HCT 29.2*  PLT 319   BMET Recent Labs    03/19/22 0606 03/20/22 0405 03/21/22 0617  NA 130* 126* 131*  K 3.9 4.0 3.3*  CL 96* 91* 98  CO2 '26 27 28  '$ GLUCOSE 157* 164* 146*  BUN <5* <5* 5*  CREATININE 0.51 0.53 0.59  CALCIUM 7.8* 8.2* 7.9*   LFT No results for input(s): "PROT", "ALBUMIN", "AST", "ALT", "ALKPHOS", "BILITOT", "BILIDIR", "IBILI" in the last 72 hours. PT/INR No results for input(s): "LABPROT", "INR" in the last 72 hours.    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 Rush Landmark today however patient will not be able to be accommodated today, plan for EUS/celiac block tomorrow 12.  We will do full liquids today n.p.o. at midnight.  We will place hold heparin 6 hours prior to procedure.  - consult to pharmacy has been placed -Pain management per the hospitalist -Ondansetron 4 mg p.o. or IV every 6 hours as needed -Continue Pantoprazole 40 mg twice daily -Await further recommendations per Dr. Candis Schatz  Hepatic abscess 01/2022  resolved  Chronic anticoagulation for SMV/SVC thrombus Last dose of Eliquis 0 8/01 Currently on heparin IV infusion We will need to hold 6 hours prior to EUS/celiac block  Normocytic anemia likely due to pancreatic cancer No active GI bleeding at this time CBC on 03/21/2022   WBC 11.4 HGB 9.8 MCV 84.6 Platelets 319 Anemia studies on 03/08/2022  Iron 38 Ferritin 38 B12 846  Electrolyte derangement Mild hypokalemia and hyponatremia today Recheck tomorrow replete as needed prior to procedure. Per the hospitalist team   LOS: 16 days   Melanie Alvarez  03/21/2022, 9:38 AM  --------------------------------------------------------------------  I have reviewed the chart I agree with the APP's note, impression and recommendations. Plan  for celiac plexus block with Dr. Rush Landmark 8/4  Melanie Alvarez E. Candis Schatz, MD [facility] Gastroenterology

## 2022-03-21 NOTE — Progress Notes (Addendum)
ANTICOAGULATION CONSULT NOTE - Follow Up Consult  Pharmacy Consult for Heparin Indication: portal vein and mesenteric thrombosis   Allergies  Allergen Reactions   Lisinopril Swelling and Other (See Comments)    Facial and upper lip    Patient Measurements: Height: '5\' 3"'$  (160 cm) Weight: 68 kg (150 lb) IBW/kg (Calculated) : 52.4 Heparin Dosing Weight: 66 kg  Vital Signs: Temp: 98.3 F (36.8 C) (08/03 0619) Temp Source: Oral (08/03 0619) BP: 117/71 (08/03 0619) Pulse Rate: 85 (08/03 0619)  Labs: Recent Labs    03/19/22 0606 03/20/22 0405 03/20/22 1935 03/21/22 0617  HGB  --   --   --  9.8*  HCT  --   --   --  29.2*  PLT  --   --   --  319  APTT  --   --  >200* >200*  HEPARINUNFRC  --   --  >1.10*  --   CREATININE 0.51 0.53  --  0.59    Estimated Creatinine Clearance: 64.9 mL/min (by C-G formula based on SCr of 0.59 mg/dL).   Assessment AC/Heme: Hx portal vein and mesenteric thrombosis previously on Apixaban 2 weeks PTA (admit 7/17) - MD notes patient has been off Eliquis x 2 weeks (did not know to get next prescription after starter pack)-SZP done.   7/18-7/19, 7/22 x 1 dose Eliquis 7/22-7/28 IV heparin 7/28-8/1 PM, Eliquis 8/2 resumed IV heparin in preparation for procedure - 8/2: aPTT>200, drawn correctly, Hold heparin x 1 hour then resume at lower rate of 1500 units/hr  - 8/3: aPTT>200 again, Hgb 9.8, Plts 319, turn off for procedure  Goal of Therapy:  aPTT 66-102 seconds Monitor platelets by anticoagulation protocol: Yes   Plan:  0900 Hold Hep Thurs for EUS/celiac block procedure scheduled at 3pm  May resume Eliquis post-procedure  Adden 0956: 0800 turned off for high level and procedure which got cancelled. Resume heparin at 1050 units/hr (16 units/kg/hr).- Check aPTT and HL in 6 hrs. 0600 Hold Hep Fri for EUS/celiac block procedure   Tecla Mailloux S. Alford Highland, PharmD, BCPS Clinical Staff Pharmacist [email or web address]  Alford Highland, [place] 03/21/2022,8:13 AM

## 2022-03-22 ENCOUNTER — Encounter (HOSPITAL_COMMUNITY): Payer: Self-pay | Admitting: Student

## 2022-03-22 ENCOUNTER — Encounter (HOSPITAL_COMMUNITY)
Admission: EM | Disposition: A | Discharge status: Home / Self Care | Payer: Self-pay | Source: Ambulatory Visit | Attending: Student

## 2022-03-22 ENCOUNTER — Inpatient Hospital Stay (HOSPITAL_COMMUNITY): Payer: BC Managed Care – PPO | Admitting: Anesthesiology

## 2022-03-22 DIAGNOSIS — I10 Essential (primary) hypertension: Secondary | ICD-10-CM | POA: Diagnosis not present

## 2022-03-22 DIAGNOSIS — C250 Malignant neoplasm of head of pancreas: Secondary | ICD-10-CM | POA: Diagnosis not present

## 2022-03-22 DIAGNOSIS — I82890 Acute embolism and thrombosis of other specified veins: Secondary | ICD-10-CM | POA: Diagnosis not present

## 2022-03-22 DIAGNOSIS — E871 Hypo-osmolality and hyponatremia: Secondary | ICD-10-CM | POA: Diagnosis not present

## 2022-03-22 DIAGNOSIS — C259 Malignant neoplasm of pancreas, unspecified: Secondary | ICD-10-CM

## 2022-03-22 HISTORY — PX: UPPER ESOPHAGEAL ENDOSCOPIC ULTRASOUND (EUS): SHX6562

## 2022-03-22 HISTORY — PX: ESOPHAGOGASTRODUODENOSCOPY (EGD) WITH PROPOFOL: SHX5813

## 2022-03-22 HISTORY — PX: NEUROLYTIC CELIAC PLEXUS: SHX5435

## 2022-03-22 HISTORY — PX: HOT HEMOSTASIS: SHX5433

## 2022-03-22 LAB — BASIC METABOLIC PANEL
Anion gap: 7 (ref 5–15)
BUN: 5 mg/dL — ABNORMAL LOW (ref 8–23)
CO2: 27 mmol/L (ref 22–32)
Calcium: 8 mg/dL — ABNORMAL LOW (ref 8.9–10.3)
Chloride: 97 mmol/L — ABNORMAL LOW (ref 98–111)
Creatinine, Ser: 0.54 mg/dL (ref 0.44–1.00)
GFR, Estimated: >60 mL/min (ref >60)
Glucose, Bld: 183 mg/dL — ABNORMAL HIGH (ref 70–99)
Potassium: 3.6 mmol/L (ref 3.5–5.1)
Sodium: 131 mmol/L — ABNORMAL LOW (ref 135–145)

## 2022-03-22 LAB — CBC
HCT: 28.1 % — ABNORMAL LOW (ref 36.0–46.0)
Hemoglobin: 9.2 g/dL — ABNORMAL LOW (ref 12.0–15.0)
MCH: 28.3 pg (ref 26.0–34.0)
MCHC: 32.7 g/dL (ref 30.0–36.0)
MCV: 86.5 fL (ref 80.0–100.0)
Platelets: 300 10*3/uL (ref 150–400)
RBC: 3.25 MIL/uL — ABNORMAL LOW (ref 3.87–5.11)
RDW: 15.1 % (ref 11.5–15.5)
WBC: 8.7 10*3/uL (ref 4.0–10.5)
nRBC: 0 % (ref 0.0–0.2)

## 2022-03-22 LAB — GLUCOSE, CAPILLARY
Glucose-Capillary: 158 mg/dL — ABNORMAL HIGH (ref 70–99)
Glucose-Capillary: 168 mg/dL — ABNORMAL HIGH (ref 70–99)
Glucose-Capillary: 257 mg/dL — ABNORMAL HIGH (ref 70–99)
Glucose-Capillary: 294 mg/dL — ABNORMAL HIGH (ref 70–99)

## 2022-03-22 LAB — APTT: aPTT: 163 seconds (ref 24–36)

## 2022-03-22 LAB — HEPARIN LEVEL (UNFRACTIONATED): Heparin Unfractionated: 0.34 IU/mL (ref 0.30–0.70)

## 2022-03-22 LAB — MAGNESIUM: Magnesium: 1.6 mg/dL — ABNORMAL LOW (ref 1.7–2.4)

## 2022-03-22 SURGERY — UPPER ESOPHAGEAL ENDOSCOPIC ULTRASOUND (EUS)
Anesthesia: Monitor Anesthesia Care

## 2022-03-22 MED ORDER — LACTATED RINGERS IV SOLN
INTRAVENOUS | Status: DC
  Administered 2022-03-22: 1000 mL via INTRAVENOUS

## 2022-03-22 MED ORDER — LOPERAMIDE HCL 2 MG PO CAPS
4.0000 mg | ORAL_CAPSULE | Freq: Once | ORAL | Status: DC
  Filled 2022-03-22: qty 2

## 2022-03-22 MED ORDER — TRIAMCINOLONE ACETONIDE 40 MG/ML IJ SUSP
INTRAMUSCULAR | Status: AC
  Filled 2022-03-22: qty 2

## 2022-03-22 MED ORDER — PROPOFOL 1000 MG/100ML IV EMUL
INTRAVENOUS | Status: AC
  Filled 2022-03-22: qty 100

## 2022-03-22 MED ORDER — MAGNESIUM SULFATE 4 GM/100ML IV SOLN
4.0000 g | Freq: Once | INTRAVENOUS | Status: AC
Start: 2022-03-22 — End: 2022-03-22
  Administered 2022-03-22: 4 g via INTRAVENOUS
  Filled 2022-03-22: qty 100

## 2022-03-22 MED ORDER — BUPIVACAINE HCL (PF) 0.25 % IJ SOLN
20.0000 mL | Freq: Once | INTRAMUSCULAR | Status: DC
Start: 2022-03-22 — End: 2022-03-24
  Filled 2022-03-22: qty 20

## 2022-03-22 MED ORDER — POTASSIUM CHLORIDE CRYS ER 20 MEQ PO TBCR
40.0000 meq | EXTENDED_RELEASE_TABLET | Freq: Two times a day (BID) | ORAL | Status: AC
  Administered 2022-03-22 (×2): 40 meq via ORAL
  Filled 2022-03-22 (×2): qty 2

## 2022-03-22 MED ORDER — PROPOFOL 10 MG/ML IV BOLUS
INTRAVENOUS | Status: DC | PRN
  Administered 2022-03-22: 10 mg via INTRAVENOUS
  Administered 2022-03-22 (×2): 20 mg via INTRAVENOUS

## 2022-03-22 MED ORDER — SODIUM CHLORIDE 0.9 % IV SOLN
INTRAVENOUS | Status: DC
Start: 2022-03-22 — End: 2022-03-22

## 2022-03-22 MED ORDER — PANCRELIPASE (LIP-PROT-AMYL) 36000-114000 UNITS PO CPEP: 36000.0000 [IU] | ORAL_CAPSULE | Freq: Three times a day (TID) | ORAL | Status: DC | PRN

## 2022-03-22 MED ORDER — HEPARIN (PORCINE) 25000 UT/250ML-% IV SOLN
800.0000 [IU]/h | INTRAVENOUS | Status: DC
  Administered 2022-03-22: 800 [IU]/h via INTRAVENOUS
  Filled 2022-03-22: qty 250

## 2022-03-22 MED ORDER — CIPROFLOXACIN IN D5W 400 MG/200ML IV SOLN
INTRAVENOUS | Status: AC
  Filled 2022-03-22: qty 200

## 2022-03-22 MED ORDER — PANCRELIPASE (LIP-PROT-AMYL) 36000-114000 UNITS PO CPEP
72000.0000 [IU] | ORAL_CAPSULE | Freq: Three times a day (TID) | ORAL | Status: DC
  Administered 2022-03-22 – 2022-03-24 (×5): 72000 [IU] via ORAL
  Filled 2022-03-22 (×7): qty 2

## 2022-03-22 MED ORDER — CIPROFLOXACIN IN D5W 400 MG/200ML IV SOLN
400.0000 mg | Freq: Once | INTRAVENOUS | Status: AC
  Administered 2022-03-22: 400 mg via INTRAVENOUS

## 2022-03-22 MED ORDER — LOPERAMIDE HCL 2 MG PO CAPS: 4.0000 mg | ORAL_CAPSULE | Freq: Three times a day (TID) | ORAL | Status: DC | PRN

## 2022-03-22 MED ORDER — PROPOFOL 500 MG/50ML IV EMUL
INTRAVENOUS | Status: DC | PRN
  Administered 2022-03-22: 120 ug/kg/min via INTRAVENOUS

## 2022-03-22 MED ORDER — ONDANSETRON HCL 4 MG/2ML IJ SOLN
INTRAMUSCULAR | Status: DC | PRN
  Administered 2022-03-22: 4 mg via INTRAVENOUS

## 2022-03-22 NOTE — H&P (Signed)
GASTROENTEROLOGY PROCEDURE H&P NOTE   Primary Care Physician: Flossie Buffy, NP  HPI: Melanie Alvarez is a 66 y.o. female who presents for EGD/EUS to follow-up duodenal stent placement and for celiac axis block in setting of likely pancreatic induced cancer pain.  Past Medical History:  Diagnosis Date   Cancer ([place])    Cirrhosis ([place])    Diabetes mellitus 2012   Diverticulosis    Gallbladder sludge    Hepatitis 2010   history of Hepatitis C   Hepatitis C    Hypertension 2011   Obesity    Past Surgical History:  Procedure Laterality Date   BILIARY BRUSHING  09/22/2019   Procedure: BILIARY BRUSHING;  Surgeon: Irving Copas., MD;  Location: [facility];  Service: Gastroenterology;;   BILIARY DILATION  09/22/2019   Procedure: BILIARY DILATION;  Surgeon: Irving Copas., MD;  Location: [facility];  Service: Gastroenterology;;   BILIARY STENT PLACEMENT N/A 09/22/2019   Procedure: BILIARY STENT PLACEMENT;  Surgeon: Irving Copas., MD;  Location: [facility];  Service: Gastroenterology;  Laterality: N/A;   COLONOSCOPY  07/24/2011   Procedure: COLONOSCOPY;  Surgeon: Landry Dyke, MD;  Location: [facility];  Service: Endoscopy;  Laterality: N/A;   Dental Surgery Tooth Extraction     DUODENAL STENT PLACEMENT N/A 03/12/2022   Procedure: DUODENAL STENT PLACEMENT;  Surgeon: Rush Landmark Telford Nab., MD;  Location: [facility];  Service: Gastroenterology;  Laterality: N/A;   ERCP N/A 09/22/2019   Procedure: ENDOSCOPIC RETROGRADE CHOLANGIOPANCREATOGRAPHY (ERCP);  Surgeon: Irving Copas., MD;  Location: [facility];  Service: Gastroenterology;  Laterality: N/A;   ESOPHAGOGASTRODUODENOSCOPY (EGD) WITH PROPOFOL N/A 09/22/2019   Procedure: ESOPHAGOGASTRODUODENOSCOPY (EGD) WITH PROPOFOL;  Surgeon: Rush Landmark Telford Nab., MD;  [facility];  Service: Gastroenterology;  Laterality: N/A;   ESOPHAGOGASTRODUODENOSCOPY (EGD) WITH PROPOFOL N/A  09/25/2019   Procedure: ESOPHAGOGASTRODUODENOSCOPY (EGD) WITH PROPOFOL;  Surgeon: Rush Landmark Telford Nab., MD;  [facility];  Service: Gastroenterology;  Laterality: N/A;   ESOPHAGOGASTRODUODENOSCOPY (EGD) WITH PROPOFOL N/A 01/19/2020   Procedure: ESOPHAGOGASTRODUODENOSCOPY (EGD) WITH PROPOFOL;  Surgeon: Rush Landmark Telford Nab., MD;  Location: [place];  Service: Gastroenterology;  Laterality: N/A;   ESOPHAGOGASTRODUODENOSCOPY (EGD) WITH PROPOFOL N/A 03/08/2022   Procedure: ESOPHAGOGASTRODUODENOSCOPY (EGD) WITH PROPOFOL;  Surgeon: Ladene Artist, MD;  [facility];  Service: Gastroenterology;  Laterality: N/A;   ESOPHAGOGASTRODUODENOSCOPY (EGD) WITH PROPOFOL N/A 03/12/2022   Procedure: ESOPHAGOGASTRODUODENOSCOPY (EGD) WITH PROPOFOL;  Surgeon: Rush Landmark Telford Nab., MD;  [facility];  Service: Gastroenterology;  Laterality: N/A;  placement of duodenal stent   EUS N/A 09/22/2019   Procedure: UPPER ENDOSCOPIC ULTRASOUND (EUS) RADIAL;  Surgeon: Irving Copas., MD;  [facility]: [facility];  Service: Gastroenterology;  Laterality: N/A;   EUS N/A 01/19/2020   Procedure: UPPER ENDOSCOPIC ULTRASOUND (EUS) RADIAL;  Surgeon: Irving Copas., MD;  Location: [place];  Service: Gastroenterology;  Laterality: N/A;   FIDUCIAL MARKER PLACEMENT N/A 01/19/2020   Procedure: FIDUCIAL MARKER PLACEMENT;  Surgeon: Irving Copas., MD;  Location: [place];  Service: Gastroenterology;  Laterality: N/A;   FINE NEEDLE ASPIRATION  09/22/2019   Procedure: FINE NEEDLE ASPIRATION (FNA) LINEAR;  Surgeon: Irving Copas., MD;  Location: [facility];  Service: Gastroenterology;;   FINE NEEDLE ASPIRATION  09/25/2019   Procedure: FINE NEEDLE ASPIRATION (FNA) RADIAL;  Surgeon: Irving Copas., MD;  Location: [facility];  Service: Gastroenterology;;   HEMOSTASIS CLIP PLACEMENT  09/25/2019   Procedure: HEMOSTASIS CLIP PLACEMENT;  Surgeon: Irving Copas.,  MD;  [facility];  Service: Gastroenterology;;   IR CATHETER TUBE CHANGE  01/31/2022   IR CHOLANGIOGRAM EXISTING TUBE  02/11/2022   IR IMAGING GUIDED PORT INSERTION  09/24/2019   IR IMAGING GUIDED PORT INSERTION  12/31/2021   IR RADIOLOGIST EVAL & MGMT  01/30/2022   IR RADIOLOGIST EVAL & MGMT  02/14/2022   IR REMOVAL TUN ACCESS W/ PORT W/O FL MOD SED  06/16/2020   SPHINCTEROTOMY  09/22/2019   Procedure: SPHINCTEROTOMY;  Surgeon: Mansouraty, Telford Nab., MD;  Location: [facility];  Service: Gastroenterology;;   UPPER ESOPHAGEAL ENDOSCOPIC ULTRASOUND (EUS) N/A 09/25/2019   Procedure: UPPER ESOPHAGEAL ENDOSCOPIC ULTRASOUND (EUS);  Surgeon: Irving Copas., MD;  Location: [facility];  Service: Gastroenterology;  Laterality: N/A;   Current Facility-Administered Medications  Medication Dose Route Frequency Provider Last Rate Last Admin   [MAR Hold] 0.9 %  sodium chloride infusion   Intravenous PRN Ennever, Rudell Cobb, MD       0.9 %  sodium chloride infusion   Intravenous Continuous Mansouraty, Telford Nab., MD 20 mL/hr at 03/22/22 1010 New Bag at 03/22/22 1010   [MAR Hold] alum & mag hydroxide-simeth (MAALOX/MYLANTA) 200-200-20 MG/5ML suspension 30 mL  30 mL Oral Q4H PRN Vernell Leep D, MD   30 mL at 03/20/22 0039   [MAR Hold] amLODipine (NORVASC) tablet 10 mg  10 mg Oral QHS Tu, Ching T, DO   10 mg at 03/21/22 2141   [facility] Hold] bupivacaine (PF) (MARCAINE) 0.25 % injection 20 mL  20 mL Infiltration Once Bonnielee Haff, MD       [facility] Hold] Chlorhexidine Gluconate Cloth 2 % PADS 6 each  6 each Topical Daily Mercy Riding, MD   6 each at 03/21/22 1552   [MAR Hold] fentaNYL (DURAGESIC) 50 MCG/HR 1 patch  1 patch Transdermal Q72H Modena Jansky, MD   1 patch at 03/19/22 1339   [MAR Hold] gabapentin (NEURONTIN) capsule 400 mg  400 mg Oral TID Volanda Napoleon, MD   400 mg at 03/22/22 0824   [MAR Hold] hydrocortisone cream 1 %   Topical PRN Bonnielee Haff, MD   1 Application at 98/33/82  2232   [facility] Hold] HYDROmorphone (DILAUDID) tablet 1 mg  1 mg Oral Q3H PRN Modena Jansky, MD   1 mg at 03/22/22 0358   [MAR Hold] labetalol (NORMODYNE) injection 10 mg  10 mg Intravenous Q2H PRN Mercy Riding, MD       lactated ringers infusion   Intravenous Continuous Mansouraty, Telford Nab., MD 10 mL/hr at 03/22/22 1213 1,000 mL at 03/22/22 1213   [MAR Hold] lip balm (CARMEX) ointment   Topical PRN Mercy Riding, MD   Given at 03/06/22 1304   [MAR Hold] liver oil-zinc oxide (DESITIN) 40 % ointment   Topical PRN Kathryne Eriksson, NP   Given at 03/13/22 2239   [MAR Hold] loperamide (IMODIUM) capsule 4 mg  4 mg Oral TID PRN Bonnielee Haff, MD       [facility] Hold] loperamide (IMODIUM) capsule 4 mg  4 mg Oral Once Bonnielee Haff, MD       [facility] Hold] ondansetron [facility]) tablet 4 mg  4 mg Oral Q6H PRN Modena Jansky, MD   4 mg at 03/21/22 2020   [facility] Hold] Oral care mouth rinse  15 mL Mouth Rinse PRN Mercy Riding, MD       [MAR Hold] pantoprazole (PROTONIX) EC tablet 40 mg  40 mg Oral BID Esterwood, Amy S,  PA-C   40 mg at 03/22/22 0824   [MAR Hold] potassium chloride SA (KLOR-CON M) CR tablet 40 mEq  40 mEq Oral BID Bonnielee Haff, MD   40 mEq at 03/22/22 0824   [MAR Hold] prochlorperazine (COMPAZINE) injection 5 mg  5 mg Intravenous Q6H PRN Wendee Beavers T, MD   5 mg at 03/10/22 1835   [MAR Hold] sodium chloride flush (NS) 0.9 % injection 10-40 mL  10-40 mL Intracatheter Q12H Gonfa, Taye T, MD   10 mL at 03/22/22 0825   [MAR Hold] sodium chloride flush (NS) 0.9 % injection 10-40 mL  10-40 mL Intracatheter PRN Wendee Beavers T, MD   10 mL at 03/20/22 0406   [MAR Hold] sodium chloride tablet 1 g  1 g Oral BID WC Bonnielee Haff, MD   1 g at 03/22/22 0350    Current Facility-Administered Medications:    [MAR Hold] 0.9 %  sodium chloride infusion, , Intravenous, PRN, Volanda Napoleon, MD   0.9 %  sodium chloride infusion, , Intravenous, Continuous, Mansouraty, Telford Nab., MD, Last Rate: 20 mL/hr at  03/22/22 1010, New Bag at 03/22/22 1010   [MAR Hold] alum & mag hydroxide-simeth (MAALOX/MYLANTA) 200-200-20 MG/5ML suspension 30 mL, 30 mL, Oral, Q4H PRN, Hongalgi, Anand D, MD, 30 mL at 03/20/22 0039   [MAR Hold] amLODipine (NORVASC) tablet 10 mg, 10 mg, Oral, QHS, Tu, Ching T, DO, 10 mg at 03/21/22 2141   [facility] Hold] bupivacaine (PF) (MARCAINE) 0.25 % injection 20 mL, 20 mL, Infiltration, Once, Bonnielee Haff, MD   [facility] Hold] Chlorhexidine Gluconate Cloth 2 % PADS 6 each, 6 each, Topical, Daily, Cyndia Skeeters, Taye T, MD, 6 each at 03/21/22 1552   [MAR Hold] fentaNYL (DURAGESIC) 50 MCG/HR 1 patch, 1 patch, Transdermal, Q72H, Hongalgi, Anand D, MD, 1 patch at 03/19/22 1339   [MAR Hold] gabapentin (NEURONTIN) capsule 400 mg, 400 mg, Oral, TID, Volanda Napoleon, MD, 400 mg at 03/22/22 0824   [MAR Hold] hydrocortisone cream 1 %, , Topical, PRN, Bonnielee Haff, MD, 1 Application at 09/38/18 2232   [facility] Hold] HYDROmorphone (DILAUDID) tablet 1 mg, 1 mg, Oral, Q3H PRN, Vernell Leep D, MD, 1 mg at 03/22/22 0358   [MAR Hold] labetalol (NORMODYNE) injection 10 mg, 10 mg, Intravenous, Q2H PRN, Cyndia Skeeters, Charlesetta Ivory, MD   lactated ringers infusion, , Intravenous, Continuous, Mansouraty, Telford Nab., MD, Last Rate: 10 mL/hr at 03/22/22 1213, 1,000 mL at 03/22/22 1213   [MAR Hold] lip balm (CARMEX) ointment, , Topical, PRN, Mercy Riding, MD, Given at 03/06/22 1304   [MAR Hold] liver oil-zinc oxide (DESITIN) 40 % ointment, , Topical, PRN, Kathryne Eriksson, NP, Given at 03/13/22 2239   [facility] Hold] loperamide (IMODIUM) capsule 4 mg, 4 mg, Oral, TID PRN, Bonnielee Haff, MD   [facility] Hold] loperamide (IMODIUM) capsule 4 mg, 4 mg, Oral, Once, Bonnielee Haff, MD   [facility] Hold] ondansetron [facility]) tablet 4 mg, 4 mg, Oral, Q6H PRN, Hongalgi, Anand D, MD, 4 mg at 03/21/22 2020   [facility] Hold] Oral care mouth rinse, 15 mL, Mouth Rinse, PRN, Gonfa, Taye T, MD   [MAR Hold] pantoprazole (PROTONIX) EC tablet 40 mg, 40 mg, Oral, BID,  Esterwood, Amy S, PA-C, 40 mg at 03/22/22 0824   [MAR Hold] potassium chloride SA (KLOR-CON M) CR tablet 40 mEq, 40 mEq, Oral, BID, Bonnielee Haff, MD, 40 mEq at 03/22/22 0824   [MAR Hold] prochlorperazine (COMPAZINE) injection 5 mg, 5 mg, Intravenous, Q6H PRN, Gonfa, Taye T,  MD, 5 mg at 03/10/22 1835   [MAR Hold] sodium chloride flush (NS) 0.9 % injection 10-40 mL, 10-40 mL, Intracatheter, Q12H, Gonfa, Taye T, MD, 10 mL at 03/22/22 0825   [MAR Hold] sodium chloride flush (NS) 0.9 % injection 10-40 mL, 10-40 mL, Intracatheter, PRN, Cyndia Skeeters, Taye T, MD, 10 mL at 03/20/22 0406   [MAR Hold] sodium chloride tablet 1 g, 1 g, Oral, BID WC, Bonnielee Haff, MD, 1 g at 03/22/22 0300 Allergies  Allergen Reactions   Lisinopril Swelling and Other (See Comments)    Facial and upper lip   Family History  Problem Relation Age of Onset   Diabetes Sister    Cancer Maternal Grandmother        leukemia    Diabetes Maternal Grandfather    Diabetes Paternal Grandfather    Diabetes Paternal Grandmother    Hypertension Sister    Hypertension Brother    Anesthesia problems Neg Hx    Social History   Socioeconomic History   Marital status: Married    Spouse name: Not on file   Number of children: Not on file   Years of education: Not on file   Highest education level: Not on file  Occupational History   Not on file  Tobacco Use   Smoking status: Former    Packs/day: 0.25    Types: Cigarettes    Quit date: 09/20/2019    Years since quitting: 2.5   Smokeless tobacco: Never  Vaping Use   Vaping Use: Never used  Substance and Sexual Activity   Alcohol use: Not Currently    Comment: quit 2022   Drug use: No    Comment: previously    Sexual activity: Yes    Partners: Male    Birth control/protection: Post-menopausal  Other Topics Concern   Not on file  Social History Narrative   Previous [facility] pt.  Last MD was Dr. Delman Cheadle, seen 12/2011      Works part time in [employer] with husband   Social Determinants of Health   Financial Resource Strain: Not on file  Food Insecurity: Not on file  Transportation Needs: Not on file  Physical Activity: Not on file  Stress: Not on file  Social Connections: Not on file  Intimate Partner Violence: Not on file    Physical Exam: Today's Vitals   03/21/22 2140 03/21/22 2142 03/22/22 0446 03/22/22 1205  BP: 117/64 117/64 119/66 (!) 145/72  Pulse: 77 78 89 83  Resp: 18  16 (!) 22  Temp: 97.7 F (36.5 C) 97.7 F (36.5 C) 98 F (36.7 C) 97.8 F (36.6 C)  TempSrc: Oral Oral Oral Temporal  SpO2: 100% 99% 100% 97%  Weight:      Height:      PainSc: 0-No pain   4    Body mass index is 26.57 kg/m. GEN: NAD EYE: Sclerae anicteric ENT: MMM CV: Non-tachycardic GI: Soft, protuberant abdomen, tenderness to palpation generalized throughout (5 out of 10 pain currently) NEURO:  Alert & Oriented x 3  Lab Results: Recent Labs    03/21/22 0617 03/22/22 0439  WBC 11.4* 8.7  HGB 9.8* 9.2*  HCT 29.2* 28.1*  PLT 319 300   BMET Recent Labs    03/20/22 0405 03/21/22 0617 03/22/22 0439  NA 126* 131* 131*  K 4.0 3.3* 3.6  CL 91* 98 97*  CO2 '27 28 27  '$ GLUCOSE 164* 146* 183*  BUN <5*  5* 5*  CREATININE 0.53 0.59 0.54  CALCIUM 8.2* 7.9* 8.0*   LFT No results for input(s): "PROT", "ALBUMIN", "AST", "ALT", "ALKPHOS", "BILITOT", "BILIDIR", "IBILI" in the last 72 hours. PT/INR No results for input(s): "LABPROT", "INR" in the last 72 hours.   Impression / Plan: This is a 66 y.o.female who presents for EGD/EUS to follow-up duodenal stent placement and for celiac axis block in setting of likely pancreatic induced cancer pain.  The risks of an EUS including intestinal perforation, bleeding, infection, aspiration, and medication effects were discussed as was the possibility it may not give a definitive diagnosis if a biopsy is performed.  When a biopsy of the pancreas is done as part of the EUS, there is an  additional risk of pancreatitis at the rate of about 1-2%.  It was explained that procedure related pancreatitis is typically mild, although it can be severe and even life threatening, which is why we do not perform random pancreatic biopsies and only biopsy a lesion/area we feel is concerning enough to warrant the risk.   The risks and benefits of endoscopic evaluation/treatment were discussed with the patient and/or family; these include but are not limited to the risk of perforation, infection, bleeding, missed lesions, lack of diagnosis, severe illness requiring hospitalization, as well as anesthesia and sedation related illnesses.  The patient's history has been reviewed, patient examined, no change in status, and deemed stable for procedure.  The patient and/or family is agreeable to proceed.    Justice Britain, MD [facility] Office # [phone]

## 2022-03-22 NOTE — Progress Notes (Signed)
ANTICOAGULATION CONSULT NOTE - Follow Up Consult  Pharmacy Consult for Heparin Indication: portal vein and mesenteric thrombosis   Allergies  Allergen Reactions   Lisinopril Swelling and Other (See Comments)    Facial and upper lip    Patient Measurements: Height: '5\' 3"'$  (160 cm) Weight: 68 kg (150 lb) IBW/kg (Calculated) : 52.4 Heparin Dosing Weight: 66 kg  Vital Signs: Temp: 98 F (36.7 C) (08/04 0446) Temp Source: Oral (08/04 0446) BP: 119/66 (08/04 0446) Pulse Rate: 89 (08/04 0446)  Labs: Recent Labs    03/20/22 0405 03/20/22 1935 03/20/22 1935 03/21/22 0617 03/21/22 1823 03/22/22 0439  HGB  --   --   --  9.8*  --  9.2*  HCT  --   --   --  29.2*  --  28.1*  PLT  --   --   --  319  --  300  APTT  --  >200*   < > >200* >200* 163*  HEPARINUNFRC  --  >1.10*  --   --  0.73* 0.34  CREATININE 0.53  --   --  0.59  --  0.54   < > = values in this interval not displayed.     Estimated Creatinine Clearance: 64.9 mL/min (by C-G formula based on SCr of 0.54 mg/dL).   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   7/18-7/19, then 7/22 x 1 dose Eliquis 7/22-7/28 IV heparin 7/28-8/1 PM last dose, Eliquis 8/2 resumed IV heparin in preparation for procedure - 8/4: Hep level 0.34, aPTT 163, off 0600 for procedure. Hgb 9.2 dwon some, Plts WNL  Goal of Therapy:  aPTT 66-102 seconds Monitor platelets by anticoagulation protocol: Yes   Plan:   0600 Hold Hep Fri for EUS/celiac block procedure  May resume Eliquis post-procedure    Morrisa Aldaba S. Alford Highland, PharmD, BCPS Clinical Staff Pharmacist [email or web address]  Alford Highland, [place] 03/22/2022,7:29 AM

## 2022-03-22 NOTE — Transfer of Care (Signed)
Immediate Anesthesia Transfer of Care Note  Patient: Melanie Alvarez  Procedure(s) Performed: UPPER ESOPHAGEAL ENDOSCOPIC ULTRASOUND (EUS) ESOPHAGOGASTRODUODENOSCOPY (EGD) WITH PROPOFOL NEUROLYTIC CELIAC PLEXUS HOT HEMOSTASIS (ARGON PLASMA COAGULATION/BICAP)  Patient Location: PACU  Anesthesia Type:MAC  Level of Consciousness: sedated  Airway & Oxygen Therapy: Patient Spontanous Breathing and Patient connected to face mask oxygen  Post-op Assessment: Report given to RN and Post -op Vital signs reviewed and stable  Post vital signs: Reviewed and stable  Last Vitals:  Vitals Value Taken Time  BP    Temp    Pulse    Resp    SpO2      Last Pain:  Vitals:   03/22/22 1205  TempSrc: Temporal  PainSc: 4       Patients Stated Pain Goal: 2 (71/16/57 9038)  Complications: No notable events documented.

## 2022-03-22 NOTE — Op Note (Addendum)
[facility] Patient Name: Melanie Alvarez Procedure Date: 03/22/2022 MRN: [id number] Attending MD: Justice Britain , MD Date of Birth: Jan 10, 1956 CSN: [id number] Age: 66 Admit Type: Inpatient Procedure:                Upper EUS Indications:              Generalized abdominal pain, Celiac plexus block for                            pain secondary to pancreatic carcinoma Providers:                Justice Britain, MD, Carlyn Reichert, RN, Truddie Coco, RN, Cherylynn Ridges, Technician Referring MD:             Rudell Cobb. Ennever MD, MD, Inpatient Hospital Team Medicines:                Monitored Anesthesia Care, Cipro 025 mg IV Complications:            No immediate complications. Estimated Blood Loss:     Estimated blood loss was minimal. Procedure:                Pre-Anesthesia Assessment:                           - Prior to the procedure, a History and Physical                            was performed, and patient medications and                            allergies were reviewed. The patient's tolerance of                            previous anesthesia was also reviewed. The risks                            and benefits of the procedure and the sedation                            options and risks were discussed with the patient.                            All questions were answered, and informed consent                            was obtained. Prior Anticoagulants: The patient has                            taken heparin, last dose was day of procedure. ASA                            Grade Assessment: III - A patient with severe  systemic disease. After reviewing the risks and                            benefits, the patient was deemed in satisfactory                            condition to undergo the procedure.                           After obtaining informed consent, the endoscope was                             passed under direct vision. Throughout the                            procedure, the patient's blood pressure, pulse, and                            oxygen saturations were monitored continuously. The                            [id number]-[id number] ([id number]) Olympus endoscope was introduced                            through the mouth, and advanced to the second part                            of duodenum. The [id number] ([id number]) Olympus linear                            ultrasound scope was introduced through the mouth,                            and advanced to the stomach for ultrasound                            examination. The upper EUS was accomplished without                            difficulty. The patient tolerated the procedure. Scope In: Scope Out: Findings:      ENDOSCOPIC FINDING: :      No gross lesions were noted in the entire esophagus.      The Z-line was irregular and was found 35 cm from the incisors.      An endoclip was found in the cardia.      Patchy mildly erythematous mucosa without bleeding was found in the       entire examined stomach.      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      A previously placed enteral metal stent was seen in the first portion of       the duodenum and in the second portion of the duodenum and was able to       be traversed with the adult EGD scope.  ENDOSONOGRAPHIC FINDING: :      The region of the celiac was visualized. The region of the celiac plexus       and celiac ganglia was identified endosonographically with Color Doppler       imaging, using the take-off of the celiac trunk from the anterior aspect       of the aorta as the main anatomical landmark. Color Doppler guidance was       also used to confirm a lack of significant vascular structures within       the injection needle path. Celiac plexus block was attempted. Using a       transgastric approach, a 20 gauge celiac plexus  needle was advanced to       the area of the celiac ganglion. Needle aspiration was performed prior       to injection to exclude entry into a blood vessel. A total of 20 mL of       0.25% bupivacaine and 2 mg of triamcinolone (40 mg/mL) were injected for       the celiac plexus block. The needle was then withdrawn. Impression:               EGD Impression:                           - No gross lesions in esophagus. Z-line irregular,                            35 cm from the incisors.                           - An endoclip was found at the cardia.                           - Erythematous mucosa in the stomach - previously                            biopsied and negative for HP.                           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                           - Metal stent in the duodenum - previously placed                            (has jailed the biliary stent).                           EUS Impression:                           - Celiac plexus block performed. Moderate Sedation:      Not Applicable - Patient had care per Anesthesia. Recommendation:           - The patient  will be observed post-procedure,                            until all discharge criteria are met.                           - Return patient to hospital ward for ongoing care.                           - Advance diet as tolerated - starting CLD.                           - Observe patient's clinical course. Patients can                            have increased abdominal discomfort initially and                            then hopefully have further improvement as time                            passes and medications begin to work.                           - If severe pain, obtain KUB to rule out                            complications.                           - If pain cannot be controlled from what it has                             already been, then consider cross-sectional imaging                            to rule out any further complications.                           - Monitor for complications such as hypotension,                            weakened extremities, infectious complications.                           - If patient has adequate pain control with this                            for a few weeks/months, then consider role of                            Celiac Neurolysis in the future with EUS v IR.                           -  May restart heparin without bolus in 6 hours                            (700PM). If no overt bleeding, then plan for 24                            hours of heparin and may consider transition to                            NOAC if necessary.                           - The findings and recommendations were discussed                            with the patient.                           - The findings and recommendations were discussed                            with the patient's family. Procedure Code(s):        --- Professional ---                           [phone], Esophagogastroduodenoscopy, flexible,                            transoral; with transendoscopic ultrasound-guided                            transmural injection of diagnostic or therapeutic                            substance(s) (eg, anesthetic, neurolytic agent) or                            fiducial marker(s) (includes endoscopic ultrasound                            examination of the esophagus, stomach, and either                            the duodenum or a surgically altered stomach where                            the jejunum is examined distal to the anastomosis) Diagnosis Code(s):        --- Professional ---                           K22.8, Other specified diseases of esophagus                           T18.2XXA, Foreign body in stomach, initial encounter  K31.89, Other diseases of  stomach and duodenum                           R10.84, Generalized abdominal pain                           C25.9, Malignant neoplasm of pancreas, unspecified CPT copyright 2019 American Medical Association. All rights reserved. The codes documented in this report are preliminary and upon coder review may  be revised to meet current compliance requirements. Justice Britain, MD 03/22/2022 1:23:19 PM Number of Addenda: 0

## 2022-03-22 NOTE — Progress Notes (Signed)
Still awaiting to have the celiac block done.  Hopefully, this will be done today.  I know that Gastroenterology has an incredibly busy schedule.  She says that she has a "fever" in her abdomen.  I suspect this is probably a form of neuropathic discomfort.  I know she is doing well on the gabapentin.  She has not been able to eat all that much because of preparation for the celiac block.  She still on the heparin infusion.  She has had no bleeding.  There is been a little bit of diarrhea.  Her labs show a white count of 8.7.  Hemoglobin 9.2.  Platelet count 300,000.  Her sodium is 131.  BUN 5 creatinine 0.54.  Blood sugar is 183.  Hopefully, she will be able to have the celiac block today.  Will be very interesting to see how that helps with her discomfort.  Ultimately, is going to have to be treatment is going to help her most.  I am not sure when she would want to start treatment.  I know she says that she does not feel like she is ready for right now.  Her vital signs are temperature of 98.  Pulse 89.  Blood pressure 119/66.  Her abdomen is soft.  Bowel sounds are present.  There is a little bit of tenderness in the epigastric area.  There is no fluid wave.  There is no abdominal mass.  There is no palpable liver or spleen tip.  Lungs are clear.  She has good air movement bilaterally.  Cardiac exam regular rate and rhythm.  Again, we will have to see if she can get the celiac block today.  I really do think this is going to help her out.  I do appreciate the wonderful care she is getting from everybody on 4 E.  Lattie Haw, MD  Psalms 6:2

## 2022-03-22 NOTE — Anesthesia Postprocedure Evaluation (Signed)
Anesthesia Post Note  Patient: Melanie Alvarez  Procedure(s) Performed: UPPER ESOPHAGEAL ENDOSCOPIC ULTRASOUND (EUS) ESOPHAGOGASTRODUODENOSCOPY (EGD) WITH PROPOFOL NEUROLYTIC CELIAC PLEXUS HOT HEMOSTASIS (ARGON PLASMA COAGULATION/BICAP)     Patient location during evaluation: PACU Anesthesia Type: MAC Level of consciousness: awake and alert Pain management: pain level controlled Vital Signs Assessment: post-procedure vital signs reviewed and stable Respiratory status: spontaneous breathing, nonlabored ventilation and respiratory function stable Cardiovascular status: blood pressure returned to baseline and stable Postop Assessment: no apparent nausea or vomiting Anesthetic complications: no   No notable events documented.  Last Vitals:  Vitals:   03/22/22 1308 03/22/22 1318  BP: (!) 109/54 (!) 108/56  Pulse: 72 71  Resp: 15 14  Temp:    SpO2: 100% 98%    Last Pain:  Vitals:   03/22/22 1318  TempSrc:   PainSc: 0-No pain                 Pervis Hocking

## 2022-03-22 NOTE — Progress Notes (Signed)
Inpatient Diabetes Program Recommendations  AACE/ADA: New Consensus Statement on Inpatient Glycemic Control (2015)  Target Ranges:  Prepandial:   less than 140 mg/dL      Peak postprandial:   less than 180 mg/dL (1-2 hours)      Critically ill patients:  140 - 180 mg/dL   Lab Results  Component Value Date   GLUCAP 168 (H) 03/22/2022   HGBA1C 5.7 (H) 03/08/2022    Review of Glycemic Control  Latest Reference Range & Units 03/21/22 07:23 03/21/22 11:32 03/21/22 16:20 03/21/22 21:06 03/22/22 08:21 03/22/22 11:29  Glucose-Capillary 70 - 99 mg/dL 151 (H) 205 (H) 240 (H) 192 (H) 158 (H) 168 (H)   Diabetes history: DM 2 Outpatient Diabetes medications:  Glucotrol 5 mg daily Current orders for Inpatient glycemic control:  none  Inpatient Diabetes Program Recommendations:    If appropriate, consider adding Novolog very sensitive (0-6 units) q 4 hours.    Thanks,  Adah Perl, RN, BC-ADM Inpatient Diabetes Coordinator Pager [phone]  (8a-5p)

## 2022-03-22 NOTE — Progress Notes (Addendum)
ANTICOAGULATION CONSULT NOTE - Follow Up Consult  Pharmacy Consult for Heparin Indication: portal vein and mesenteric thrombosis   Allergies  Allergen Reactions   Lisinopril Swelling and Other (See Comments)    Facial and upper lip    Patient Measurements: Height: '5\' 3"'$  (160 cm) Weight: 68 kg (150 lb) IBW/kg (Calculated) : 52.4 Heparin Dosing Weight: 66 kg  Vital Signs: Temp: 97.8 F (36.6 C) (08/04 1205) Temp Source: Temporal (08/04 1205) BP: 131/75 (08/04 1328) Pulse Rate: 80 (08/04 1328)  Labs: Recent Labs    03/20/22 0405 03/20/22 1935 03/20/22 1935 03/21/22 0617 03/21/22 1823 03/22/22 0439  HGB  --   --   --  9.8*  --  9.2*  HCT  --   --   --  29.2*  --  28.1*  PLT  --   --   --  319  --  300  APTT  --  >200*   < > >200* >200* 163*  HEPARINUNFRC  --  >1.10*  --   --  0.73* 0.34  CREATININE 0.53  --   --  0.59  --  0.54   < > = values in this interval not displayed.     Estimated Creatinine Clearance: 64.9 mL/min (by C-G formula based on SCr of 0.54 mg/dL).   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   7/18-7/19, then 7/22 x 1 dose Eliquis 7/22-7/28 IV heparin 7/28-8/1 PM last dose, Eliquis 8/2 resumed IV heparin in preparation for procedure - 8/4: Hep level 0.34, aPTT 163, off 0600 for procedure. Hgb 9.2 dwon some, Plts WNL  Goal of Therapy:  aPTT 66-102 seconds Monitor platelets by anticoagulation protocol: Yes   Plan:   0600 Hold Hep Fri for EUS/celiac block procedure  May resume Eliquis post-procedure  - Per Chat with Dr. Rush Landmark, may resume heparin post-procedure with no bolus in 6 hrs (1900). Resume previous 800 units/hr and check level in 6-8 hrs.    Tonea Leiphart S. Alford Highland, PharmD, BCPS Clinical Staff Pharmacist [email or web address]  Alford Highland, [employer] 03/22/2022,1:40 PM

## 2022-03-22 NOTE — Progress Notes (Signed)
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  Lab work at the GI office concerning for hyponatremia and hypokalemia, and patient was directed to ED.  Of note, patient was off Eliquis for 2 weeks.  She did not know she was supposed to continue after starter pack.  In ED, Na 122.  K2.8.  CT abdomen and pelvis showed no acute finding but stable mild intra and extrahepatic biliary ductal dilation with palliative biliary stent in place, stable pancreatic mass with occlusion of the mesenteric and splenic vein.    Electrolytes improved.  Still with intractable nausea, vomiting and abdominal pain.  Oncology and GI consulted.  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  She had duodenal stent placed on 7/25.  GI symptoms improved.  GI gave recommendation and signed off.  Palliative medicine seen and have been followed recently.  Due to ongoing abdominal pain, Dr. Marin Olp concerned about celiac plexus invasion by cancer, recommended celiac plexus block, [facility] consulted back again.  Subjective: Patient mentions that abdominal pain is reasonably well controlled.  No nausea vomiting.  Continues to have loose stools.  Denies any abdominal pain.    Objective: Vitals:   03/21/22 2133 03/21/22 2140 03/21/22 2142 03/22/22 0446  BP:  117/64 117/64 119/66  Pulse:  77 78 89  Resp: '20 18  16  '$ Temp:  97.7 F (36.5 C) 97.7 F (36.5 C) 98 F (36.7 C)   TempSrc: Oral Oral Oral Oral  SpO2: 98% 100% 99% 100%  Weight:      Height:        Examination:  General appearance: Awake alert.  In no distress Resp: Clear to auscultation bilaterally.  Normal effort Cardio: S1-S2 is normal regular.  No S3-S4.  No rubs murmurs or bruit GI: Abdomen is soft.  Remains tender in the epigastric area without any rebound rigidity or guarding. Extremities: No edema.  Full range of motion of lower extremities. Neurologic: Alert and oriented x3.  No focal neurological deficits.      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 7/25-duodenal stent placement    Assessment and plan:  Intractable nausea/vomiting/abdominal pain/diarrhea:  EGD with duodenitis and partial duodenal obstruction due to pancreatic mass.  CT abdomen without acute finding but chronic intra and extra biliary ductal dilation with pancreatic mass.  Underwent duodenal stent placement on 03/12/2022.   Continued to have symptoms.  Pain medications were adjusted.  She is currently noted to be on fentanyl patch and hydromorphone as needed. Oncology concerned that there may be celiac plexus invasion by her cancer.  Recommend celiac plexus block.  GI was consulted.  Procedure could not be done yesterday.  Plan is for her to be done this afternoon.  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  Noted to be 126 2 days ago.  Started on salt tablets with improvement noted.  Continue current  dose of salt tablets. Continue to replace potassium.  Likely due to GI loss. Magnesium 1.6 today and will be repleted.    Portal and mesenteric vein thrombosis:  Recent diagnosis.  Patient thought she did not need Eliquis and stopped taking after starter pack. Started back on Eliquis but then changed back to IV  heparin due to need for celiac block. Should be able to resume Eliquis this evening after her procedure.   Malignant neoplasm of pancreas/pancreatic insufficiency Followed by Dr. Marin Olp.  On systemic chemo. -GI discontinued Creon. -Pain management as above.    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 -Continue monitoring  Controlled NIDDM-2 with hyperglycemia:  A1c 5.7%.  On glipizide and metformin at home.  Continue to monitor CBGs.  SSI  History of hepatic abscess Treated and resolved.  Status post drain removal on 6/29.  Essential hypertension:  Noted to be on amlodipine.  Avapro was discontinued due to hyponatremia. Blood pressure is reasonably well controlled.  Normocytic anemia:  Anemia panel suggests anemia of chronic disease.  No overt blood loss noted. Hemoglobin is stable for the most part.  Leukocytosis:  Likely demargination and malignancy versus infection.  Resolved.   DVT prophylaxis:   On IV heparin Code Status: Full code Family Communication: Discussed with patient  Disposition: Hopefully return home when improved.  Status is: Inpatient Remains inpatient appropriate because: Abdominal pain due to pancreatic cancer and duodenal stenosis.  GI now consulted for possible celiac plexus block, timing yet to be determined.   Consultants:  Oncology Gastroenterology Palliative medicine Interventional radiology  Sch Meds:  Scheduled Meds:  amLODipine  10 mg Oral QHS   Chlorhexidine Gluconate Cloth  6 each Topical Daily   fentaNYL  1 patch Transdermal Q72H   gabapentin  400 mg Oral TID   loperamide  4 mg Oral Once   pantoprazole  40 mg Oral BID   potassium chloride  40 mEq Oral BID   sodium chloride flush  10-40 mL Intracatheter Q12H   sodium chloride  1 g Oral BID WC   Continuous Infusions:  sodium chloride     sodium chloride     magnesium sulfate bolus IVPB 4 g (03/22/22 0823)   PRN Meds:.sodium chloride, alum & mag hydroxide-simeth,  hydrocortisone cream, HYDROmorphone, labetalol, lip balm, liver oil-zinc oxide, loperamide, ondansetron, mouth rinse, prochlorperazine, sodium chloride flush  Antimicrobials: Anti-infectives (From admission, onward)    Start     Dose/Rate Route Frequency Ordered Stop   03/12/22 1100  ciprofloxacin (CIPRO) IVPB 400 mg        400 mg 200 mL/hr over 60 Minutes Intravenous  Once 03/12/22 1058 03/12/22 1226         CBC: Recent Labs  Lab 03/16/22 0338 03/18/22 0332 03/21/22 0617 03/22/22 0439  WBC 11.7* 10.3 11.4* 8.7  HGB 9.3* 9.8* 9.8* 9.2*  HCT 27.8* 29.1* 29.2* 28.1*  MCV 85.8 84.6 84.6 86.5  PLT 349 335 319 300    BMP &GFR Recent Labs  Lab 03/16/22 0338 03/18/22 0332 03/19/22 0606 03/20/22 0405 03/21/22 0617 03/22/22 0439  NA 130* 127* 130* 126* 131* 131*  K 3.6 3.1* 3.9 4.0 3.3* 3.6  CL 102 94* 96* 91* 98 97*  CO2 '22 24 26 27 28 27  '$ GLUCOSE 113* 147* 157* 164* 146* 183*  BUN <5* <5* <5* <5* 5* 5*  CREATININE 0.41* 0.52 0.51 0.53 0.59 0.54  CALCIUM 7.9* 7.9* 7.8* 8.2* 7.9* 8.0*  MG 1.7 1.4* 1.7 1.7  --  1.6*  PHOS 3.0 4.2  --   --   --   --     Estimated Creatinine Clearance: 64.9 mL/min (by C-G formula based on SCr of 0.54 mg/dL). Liver & Pancreas: Recent Labs  Lab 03/16/22 0338  ALBUMIN 2.1*     Recent Labs  Lab 03/21/22 0723 03/21/22 1132 03/21/22 1620 03/21/22 2106 03/22/22 0821  GLUCAP 151* 205* 240* 192* 158*        Bonnielee Haff, MD

## 2022-03-23 DIAGNOSIS — C250 Malignant neoplasm of head of pancreas: Secondary | ICD-10-CM | POA: Diagnosis not present

## 2022-03-23 DIAGNOSIS — I10 Essential (primary) hypertension: Secondary | ICD-10-CM | POA: Diagnosis not present

## 2022-03-23 DIAGNOSIS — I82890 Acute embolism and thrombosis of other specified veins: Secondary | ICD-10-CM | POA: Diagnosis not present

## 2022-03-23 DIAGNOSIS — E871 Hypo-osmolality and hyponatremia: Secondary | ICD-10-CM | POA: Diagnosis not present

## 2022-03-23 LAB — GLUCOSE, CAPILLARY
Glucose-Capillary: 272 mg/dL — ABNORMAL HIGH (ref 70–99)
Glucose-Capillary: 286 mg/dL — ABNORMAL HIGH (ref 70–99)
Glucose-Capillary: 263 mg/dL — ABNORMAL HIGH (ref 70–99)
Glucose-Capillary: 143 mg/dL — ABNORMAL HIGH (ref 70–99)

## 2022-03-23 LAB — CBC
HCT: 31.1 % — ABNORMAL LOW (ref 36.0–46.0)
Hemoglobin: 10 g/dL — ABNORMAL LOW (ref 12.0–15.0)
MCH: 28.1 pg (ref 26.0–34.0)
MCHC: 32.2 g/dL (ref 30.0–36.0)
MCV: 87.4 fL (ref 80.0–100.0)
Platelets: 347 10*3/uL (ref 150–400)
RBC: 3.56 MIL/uL — ABNORMAL LOW (ref 3.87–5.11)
RDW: 15.2 % (ref 11.5–15.5)
WBC: 7.8 10*3/uL (ref 4.0–10.5)
nRBC: 0 % (ref 0.0–0.2)

## 2022-03-23 LAB — BASIC METABOLIC PANEL
Anion gap: 8 (ref 5–15)
BUN: 7 mg/dL — ABNORMAL LOW (ref 8–23)
CO2: 25 mmol/L (ref 22–32)
Calcium: 8.2 mg/dL — ABNORMAL LOW (ref 8.9–10.3)
Chloride: 95 mmol/L — ABNORMAL LOW (ref 98–111)
Creatinine, Ser: 0.65 mg/dL (ref 0.44–1.00)
GFR, Estimated: >60 mL/min (ref >60)
Glucose, Bld: 363 mg/dL — ABNORMAL HIGH (ref 70–99)
Potassium: 4 mmol/L (ref 3.5–5.1)
Sodium: 128 mmol/L — ABNORMAL LOW (ref 135–145)

## 2022-03-23 LAB — MAGNESIUM: Magnesium: 2.3 mg/dL (ref 1.7–2.4)

## 2022-03-23 LAB — HEPARIN LEVEL (UNFRACTIONATED): Heparin Unfractionated: 0.34 IU/mL (ref 0.30–0.70)

## 2022-03-23 MED ORDER — INSULIN GLARGINE-YFGN 100 UNIT/ML ~~LOC~~ SOLN
5.0000 [IU] | Freq: Every day | SUBCUTANEOUS | Status: DC
  Administered 2022-03-23 – 2022-03-24 (×2): 5 [IU] via SUBCUTANEOUS
  Filled 2022-03-23 (×2): qty 0.05

## 2022-03-23 MED ORDER — HEPARIN (PORCINE) 25000 UT/250ML-% IV SOLN: 800.0000 [IU]/h | INTRAVENOUS | Status: AC

## 2022-03-23 MED ORDER — INSULIN ASPART 100 UNIT/ML IJ SOLN
0.0000 [IU] | Freq: Three times a day (TID) | INTRAMUSCULAR | Status: DC
  Administered 2022-03-23: 8 [IU] via SUBCUTANEOUS
  Administered 2022-03-24 (×2): 3 [IU] via SUBCUTANEOUS

## 2022-03-23 MED ORDER — APIXABAN 5 MG PO TABS
5.0000 mg | ORAL_TABLET | Freq: Two times a day (BID) | ORAL | Status: DC
  Administered 2022-03-23 – 2022-03-24 (×3): 5 mg via ORAL
  Filled 2022-03-23 (×3): qty 1

## 2022-03-23 NOTE — Progress Notes (Signed)
ANTICOAGULATION CONSULT NOTE - Follow Up Consult  Pharmacy Consult for Heparin Indication: portal vein and mesenteric thrombosis   Allergies  Allergen Reactions   Lisinopril Swelling and Other (See Comments)    Facial and upper lip    Patient Measurements: Height: '5\' 3"'$  (160 cm) Weight: 68 kg (150 lb) IBW/kg (Calculated) : 52.4 Heparin Dosing Weight: 66 kg  Vital Signs: Temp: 98.1 F (36.7 C) (08/04 2220) Temp Source: Oral (08/04 2220) BP: 130/79 (08/04 2220) Pulse Rate: 94 (08/04 2220)  Labs: Recent Labs    03/20/22 0405 03/20/22 1935 03/21/22 0617 03/21/22 1823 03/22/22 0439 03/23/22 0242  HGB  --   --  9.8*  --  9.2*  --   HCT  --   --  29.2*  --  28.1*  --   PLT  --   --  319  --  300  --   APTT  --    < > >200* >200* 163*  --   HEPARINUNFRC  --    < >  --  0.73* 0.34 0.34  CREATININE 0.53  --  0.59  --  0.54  --    < > = values in this interval not displayed.     Estimated Creatinine Clearance: 64.9 mL/min (by C-G formula based on SCr of 0.54 mg/dL).   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   7/18-7/19, then 7/22 x 1 dose Eliquis 7/22-7/28 IV heparin 7/28-8/1 PM last dose, Eliquis 8/2 resumed IV heparin in preparation for procedure  03/23/22 S/p upper endoscopic ultrasound/celiac block on 8/4 and heparin resumed @ 800 units/hr HL = 0.34 (therapeutic) on 800 units/hr No complications of therapy noted    Goal of Therapy:  Heparin level = 0.3-0.7 aPTT 66-102 seconds Monitor platelets by anticoagulation protocol: Yes   Plan:  Continue heparin gtt @ 800 units/hr Repeat heparin level in 6 hr to confirm therapeutic dose.  Will f/u AM CBC at that time as it has not yet been drawn Monitor for signs & symptoms of bleeding Daily heparin level and CBC   [place]a Retana, Toribio Harbour,  PharmD 03/23/2022,3:34 AM

## 2022-03-23 NOTE — Progress Notes (Signed)
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  Lab work at the GI office concerning for hyponatremia and hypokalemia, and patient was directed to ED.  Of note, patient was off Eliquis for 2 weeks.  She did not know she was supposed to continue after starter pack.  In ED, Na 122.  K2.8.  CT abdomen and pelvis showed no acute finding but stable mild intra and extrahepatic biliary ductal dilation with palliative biliary stent in place, stable pancreatic mass with occlusion of the mesenteric and splenic vein.    Electrolytes improved.  Still with intractable nausea, vomiting and abdominal pain.  Oncology and GI consulted.  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  She had duodenal stent placed on 7/25.  GI symptoms improved.  GI gave recommendation and signed off.  Palliative medicine seen and have been followed recently.  Due to ongoing abdominal pain, Dr. Marin Olp concerned about celiac plexus invasion by cancer, recommended celiac plexus block, [facility] consulted back again.  Subjective: Abdominal pain is reasonably well controlled this morning.  No nausea or vomiting.  Loose stools appear to be improving.    Objective: Vitals:   03/22/22 1440 03/22/22 1515 03/22/22 2220 03/23/22 0424  BP: (!) 148/75 135/82 130/79 113/68  Pulse: 77 85 94 87  Resp: (!) '21 16 18 18  '$ Temp:   98.1 F (36.7 C) 97.9 F (36.6 C)  TempSrc:   Oral Oral   SpO2: 92% 99% 100% 99%  Weight:      Height:        Examination:  General appearance: Awake alert.  In no distress Resp: Clear to auscultation bilaterally.  Normal effort Cardio: S1-S2 is normal regular.  No S3-S4.  No rubs murmurs or bruit GI: Abdomen is soft.  Mildly tender in the epigastric area without any deformity or guarding.  Bowel sounds present. Extremities: No edema.  Full range of motion of lower extremities. Neurologic: Alert and oriented x3.  No focal neurological deficits.    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 7/25-duodenal stent placement 8/4: Celiac block done by Dr. Allison Quarry with GI    Assessment and plan:  Intractable nausea/vomiting/abdominal pain/diarrhea:  EGD with duodenitis and partial duodenal obstruction due to pancreatic mass.  CT abdomen without acute finding but chronic intra and extra biliary ductal dilation with pancreatic mass.  Underwent duodenal stent placement on 03/12/2022.   Continued to have symptoms.  Pain medications were adjusted.  She is currently noted to be on fentanyl patch and hydromorphone as needed. Oncology concerned that there may be celiac plexus invasion by her cancer.  Recommend celiac plexus block.  GI was consulted.  Underwent celiac block on 8/4.    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  Noted to be 126 2 days ago.  Started on salt tablets.  Sodium level did improve but noted to be low  today at 128.  Will recheck tomorrow. Potassium level has improved.  Magnesium 2.3.   Portal and mesenteric vein thrombosis:  Recent diagnosis.  Patient thought she did not need Eliquis and stopped taking after starter pack. Started back on Eliquis but then changed back to IV heparin due to need for celiac block. Eliquis has been  resumed.   Malignant neoplasm of pancreas/pancreatic insufficiency Followed by Dr. Marin Olp.  On systemic chemo. -Pain management as above.  Was experiencing diarrhea.  Treated with Imodium.  Pancreatic enzymes resumed by gastroenterology.   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   Controlled NIDDM-2 with hyperglycemia:  A1c 5.7%.  On glipizide and metformin at home.  Continue SSI.  CBGs noted to be poorly controlled.  Will add glargine.  History of hepatic abscess Treated and resolved.  Status post drain removal on 6/29.  Essential hypertension:  Noted to be on amlodipine.  Avapro was discontinued due to hyponatremia. Blood pressure is reasonably well controlled.  Normocytic anemia:  Anemia panel suggests anemia of chronic disease.  No overt blood loss noted. Hemoglobin is stable for the most part.  Leukocytosis:  Likely demargination and malignancy versus infection.  Resolved.   DVT prophylaxis: apixaban (ELIQUIS) tablet 5 mg    Code Status: Full code Family Communication: Discussed with patient  Disposition: Anticipate discharge tomorrow if hemoglobin is stable.  Status is: Inpatient Remains inpatient appropriate because: Abdominal pain due to pancreatic cancer and duodenal stenosis.     Consultants:  Oncology Gastroenterology Palliative medicine Interventional radiology   Sch Meds:  Scheduled Meds:  amLODipine  10 mg Oral QHS   apixaban  5 mg Oral BID   bupivacaine (PF)  20 mL Infiltration Once   Chlorhexidine Gluconate Cloth  6 each Topical Daily   fentaNYL  1 patch Transdermal Q72H   gabapentin  400 mg Oral TID   lipase/protease/amylase  72,000 Units Oral TID AC   loperamide  4 mg Oral Once   pantoprazole  40 mg Oral BID   sodium chloride flush  10-40 mL Intracatheter Q12H   sodium chloride  1 g Oral BID WC   Continuous Infusions:  sodium chloride     PRN Meds:.sodium chloride, alum & mag hydroxide-simeth, hydrocortisone cream,  HYDROmorphone, labetalol, lip balm, lipase/protease/amylase, liver oil-zinc oxide, loperamide, ondansetron, mouth rinse, prochlorperazine, sodium chloride flush  Antimicrobials: Anti-infectives (From admission, onward)    Start     Dose/Rate Route Frequency Ordered Stop   03/22/22 1315  ciprofloxacin (CIPRO) IVPB 400 mg        400 mg 200 mL/hr over 60 Minutes Intravenous  Once 03/22/22 1310 03/22/22 1412   03/12/22 1100  ciprofloxacin (CIPRO) IVPB 400 mg        400 mg 200 mL/hr over 60 Minutes Intravenous  Once 03/12/22 1058 03/12/22 1226         CBC: Recent Labs  Lab 03/18/22 0332 03/21/22 0617 03/22/22 0439 03/23/22 0500  WBC 10.3 11.4* 8.7 7.8  HGB 9.8* 9.8* 9.2* 10.0*  HCT 29.1* 29.2* 28.1* 31.1*  MCV 84.6 84.6 86.5 87.4  PLT 335 319 300 347    BMP &GFR Recent Labs  Lab 03/18/22 0332 03/19/22 0606 03/20/22 0405 03/21/22 0617 03/22/22 0439 03/23/22 0500  NA 127* 130* 126* 131* 131* 128*  K 3.1* 3.9 4.0 3.3* 3.6 4.0  CL 94* 96* 91* 98 97* 95*  CO2 '24 26 27 28 27 25  '$ GLUCOSE 147* 157* 164* 146* 183* 363*  BUN <5* <5* <5*  5* 5* 7*  CREATININE 0.52 0.51 0.53 0.59 0.54 0.65  CALCIUM 7.9* 7.8* 8.2* 7.9* 8.0* 8.2*  MG 1.4* 1.7 1.7  --  1.6* 2.3  PHOS 4.2  --   --   --   --   --     Estimated Creatinine Clearance: 64.9 mL/min (by C-G formula based on SCr of 0.65 mg/dL).    Recent Labs  Lab 03/22/22 0821 03/22/22 1129 03/22/22 1649 03/22/22 2219 03/23/22 0732  GLUCAP 158* 168* 257* 294* 272*        Bonnielee Haff, MD

## 2022-03-23 NOTE — Progress Notes (Signed)
     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 S/P celiac block 8/4  Plan: -Continue supportive treatment. -Start Creon for diarrhea likely due to pancreatic insufficiency. -OK to transition to Eliquis. -Expect D/C in AM -FU GI as needed -She has follow-up appointment with Dr. Marin Olp. -Will sign off for now. -D/W pt.     SUBJECTIVE   Doing much better this morning Tolerating soft diet. No nausea/vomiting Has diarrhea-Creon works well for her.    OBJECTIVE:     Vital signs in last 24 hours: Temp:  [97.8 F (36.6 C)-98.1 F (36.7 C)] 97.9 F (36.6 C) (08/05 0424) Pulse Rate:  [71-94] 87 (08/05 0424) Resp:  [12-22] 18 (08/05 0424) BP: (108-162)/(54-97) 113/68 (08/05 0424) SpO2:  [91 %-100 %] 99 % (08/05 0424) Last BM Date : 03/21/22 General:   Alert, well-developed female in NAD EENT:  Normal hearing, non icteric sclera, conjunctive pink.  Heart:  Regular rate and rhythm; no murmur.  No lower extremity edema   Pulm: Normal respiratory effort, lungs CTA bilaterally without wheezes or crackles. Abdomen:  Soft, nondistended, nontender.  Normal bowel sounds,.       Neurologic:  Alert and  oriented x4;  grossly normal neurologically. Psych:  Pleasant, cooperative.  Normal mood and affect.   Intake/Output from previous day: 08/04 0701 - 08/05 0700 In: 300 [I.V.:300] Out: -  Intake/Output this shift: No intake/output data recorded.  Lab Results: Recent Labs    03/21/22 0617 03/22/22 0439 03/23/22 0500  WBC 11.4* 8.7 7.8  HGB 9.8* 9.2* 10.0*  HCT 29.2* 28.1* 31.1*  PLT 319 300 347   BMET Recent Labs    03/21/22 0617 03/22/22 0439 03/23/22 0500  NA 131* 131* 128*  K 3.3* 3.6 4.0  CL 98 97* 95*  CO2 '28 27 25  '$ GLUCOSE 146* 183* 363*  BUN 5* 5* 7*  CREATININE 0.59 0.54 0.65  CALCIUM 7.9* 8.0* 8.2*    LFT No results for input(s): "PROT", "ALBUMIN", "AST", "ALT", "ALKPHOS", "BILITOT", "BILIDIR", "IBILI" in the last 72 hours. PT/INR No results for input(s): "LABPROT", "INR" in the last 72 hours. Hepatitis Panel No results for input(s): "HEPBSAG", "HCVAB", "HEPAIGM", "HEPBIGM" in the last 72 hours.  No results found.   Principal Problem:   Hyponatremia Active Problems:   Hypertension   Hypokalemia   Cirrhosis of liver (HCC)   Malignant neoplasm of pancreas [facility])   Pancreatic insufficiency   Mesenteric vein thrombosis (HCC)   Acute thrombosis of splenic vein   Intractable nausea and vomiting   Abdominal pain, epigastric   Hypomagnesemia   Duodenal obstruction, acquired   Biliary obstruction     LOS: 18 days     Carmell Austria, MD 03/23/2022, 11:07 AM [facility] [phone]

## 2022-03-23 NOTE — Progress Notes (Signed)
Melanie Alvarez had her celiac block yesterday.  I very much appreciate Gastroenterology's help with this.  She is feeling okay this morning.  She does not complain of any pain.  She still having little bit of diarrhea.  I think she was started on Creon.  I think this is a good idea.  She is currently on a heparin drip.  I will convert her back over to Eliquis.  I think she would like to go home over the weekend.  I do not see a reason why she could not go home over the weekend.  Maybe, her diet will be able to be increased so she can have something more solid.  She has had no cough.  No shortness of breath.  There is no bleeding.  She has had no fever.  All of her vital signs are stable.  Temperature 97.9.  Pulse 87.  Blood pressure 113/68.  Her abdomen is soft.  Bowel sounds are slightly decreased.  There is no guarding or rebound tenderness.  Lungs are clear bilaterally.  Cardiac exam regular rate and rhythm.  Extremity shows no clubbing, cyanosis or edema.  Again, Melanie Alvarez has metastatic pancreatic cancer.  She had partial bowel obstruction.  She had a stent placed.  She then underwent celiac block.  Everything has been done so far outside of chemotherapy.  She is trying to avoid chemotherapy.  I think she is understanding that this is some that she probably will need and could potentially benefit her.  Again the real problem is that she has a tumor pressing on her intestine.  I also think tumor is probably invading her celiac plexus.  When I put her on gabapentin, this did seem to help quite a bit.  If she goes home over the weekend, we will see about trying to get her in next week or so.  I know she is gotten incredible care from everybody on [facility].  I totally appreciate their compassion and professional approach.  Lattie Haw, MD  [facility]

## 2022-03-24 DIAGNOSIS — C251 Malignant neoplasm of body of pancreas: Secondary | ICD-10-CM | POA: Diagnosis not present

## 2022-03-24 LAB — GLUCOSE, CAPILLARY
Glucose-Capillary: 175 mg/dL — ABNORMAL HIGH (ref 70–99)
Glucose-Capillary: 171 mg/dL — ABNORMAL HIGH (ref 70–99)

## 2022-03-24 LAB — CBC
HCT: 27.7 % — ABNORMAL LOW (ref 36.0–46.0)
Hemoglobin: 9.1 g/dL — ABNORMAL LOW (ref 12.0–15.0)
MCH: 28.4 pg (ref 26.0–34.0)
MCHC: 32.9 g/dL (ref 30.0–36.0)
MCV: 86.6 fL (ref 80.0–100.0)
Platelets: 299 10*3/uL (ref 150–400)
RBC: 3.2 MIL/uL — ABNORMAL LOW (ref 3.87–5.11)
RDW: 15.2 % (ref 11.5–15.5)
WBC: 14.2 10*3/uL — ABNORMAL HIGH (ref 4.0–10.5)
nRBC: 0 % (ref 0.0–0.2)

## 2022-03-24 MED ORDER — SIMETHICONE 40 MG/0.6ML PO SUSP
40.0000 mg | Freq: Once | ORAL | Status: AC
  Administered 2022-03-24: 40 mg via ORAL
  Filled 2022-03-24: qty 0.6

## 2022-03-24 MED ORDER — LOPERAMIDE HCL 2 MG PO CAPS: 4.0000 mg | ORAL_CAPSULE | Freq: Three times a day (TID) | ORAL | 0 refills | Status: DC | PRN

## 2022-03-24 MED ORDER — GABAPENTIN 400 MG PO CAPS: 400.0000 mg | ORAL_CAPSULE | Freq: Three times a day (TID) | ORAL | 2 refills | Status: DC

## 2022-03-24 MED ORDER — FENTANYL 25 MCG/HR TD PT72: 2.0000 | MEDICATED_PATCH | TRANSDERMAL | 0 refills | Status: DC

## 2022-03-24 MED ORDER — SODIUM CHLORIDE 1 G PO TABS: 1.0000 g | ORAL_TABLET | Freq: Two times a day (BID) | ORAL | 2 refills | Status: DC

## 2022-03-24 MED ORDER — APIXABAN 5 MG PO TABS: 5.0000 mg | ORAL_TABLET | Freq: Two times a day (BID) | ORAL | 2 refills | Status: DC

## 2022-03-24 MED ORDER — HEPARIN SOD (PORK) LOCK FLUSH 100 UNIT/ML IV SOLN
500.0000 [IU] | INTRAVENOUS | Status: AC | PRN
  Administered 2022-03-24: 500 [IU]

## 2022-03-24 MED ORDER — FENTANYL 50 MCG/HR TD PT72
1.0000 | MEDICATED_PATCH | TRANSDERMAL | Status: DC
  Administered 2022-03-24: 1 via TRANSDERMAL
  Filled 2022-03-24: qty 1

## 2022-03-24 NOTE — Discharge Summary (Signed)
[facility]  Physician Discharge Summary   Patient ID: Melanie Alvarez MRN: [id number] DOB/AGE: 1956-02-12 66 y.o.  Admit date: 03/04/2022 Discharge date: 03/24/2022    PCP: Flossie Buffy, NP  DISCHARGE DIAGNOSES:    Intractable nausea and vomiting   Hypertension   Hypokalemia   Cirrhosis of liver (HCC)   Malignant neoplasm of pancreas (HCC)   Pancreatic insufficiency   Mesenteric vein thrombosis (HCC)   Acute thrombosis of splenic vein   Abdominal pain, epigastric   Hypomagnesemia   Duodenal obstruction, acquired   Biliary obstruction   RECOMMENDATIONS FOR OUTPATIENT FOLLOW UP: Dr. 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  Lab work at the GI office concerning for hyponatremia and hypokalemia, and patient was directed to ED.  Of note, patient was off Eliquis for 2 weeks.  She did not know she was supposed to continue after starter pack.   In ED, Na 122.  K2.8.  CT abdomen and pelvis showed no acute finding but stable mild intra and extrahepatic biliary ductal dilation with palliative biliary stent in place, stable pancreatic mass with occlusion of the mesenteric and splenic vein.     Electrolytes improved.  Still with intractable nausea, vomiting and abdominal pain.  Oncology and GI consulted.  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  She had duodenal stent placed on  7/25.  GI symptoms improved.  GI gave recommendation and signed off.  Palliative medicine seen and have been followed recently.  Due to ongoing abdominal pain, Dr. Marin Olp concerned about celiac plexus invasion by cancer, recommended celiac plexus block, [facility] consulted back again.  Consultations: Medical oncology [facility]  Procedures: 7/21-EGD-normal esophagus, large volume bilious gastric fluid, previously placed Endo Clip in gastric cardia, acquired extrinsic duodenal stenosis and duodenal erosion without bleeding. 7/25-duodenal stent placement 8/4: Celiac block done by Dr. Allison Quarry with GI   HOSPITAL COURSE:   Intractable nausea/vomiting/abdominal pain/diarrhea/duodenal obstruction EGD with duodenitis and partial duodenal obstruction due to pancreatic mass.  CT abdomen without acute finding but chronic intra and extra biliary ductal dilation with pancreatic mass.  Underwent duodenal stent placement on 03/12/2022.   Continued to have symptoms.  Pain medications were adjusted.  She is currently noted to be on fentanyl patch and hydromorphone as needed. Oncology concerned that there may be celiac plexus invasion by her cancer.  Recommend celiac plexus block.  GI was consulted.  Underwent celiac block on 8/4.     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  Noted to be 126 2 days ago.  Started on salt tablets.  Sodium levels are stable. Potassium level has improved.  Magnesium 2.3.    Portal and mesenteric vein thrombosis:  Recent diagnosis.  Patient thought she did not need Eliquis and stopped taking after starter pack. Started back on Eliquis.   Malignant neoplasm of pancreas/pancreatic insufficiency Followed by Dr. Marin Olp.  On systemic chemo. -Pain management as above.  Was experiencing diarrhea.  Treated with Imodium.  Pancreatic enzymes resumed by gastroenterology.   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     Controlled NIDDM-2 with hyperglycemia:  A1c 5.7%.     History of hepatic abscess Treated and resolved.  Status post drain removal on 6/29.   Essential hypertension:    Normocytic anemia:  Anemia panel suggests anemia of chronic disease.  No overt blood loss noted. Hemoglobin is stable for the most part.   Leukocytosis:  Likely demargination and malignancy versus infection.  Resolved.     Patient is stable.  Okay for discharge home today.   PERTINENT LABS:  The results of significant diagnostics from this hospitalization (including imaging, microbiology, ancillary and laboratory) are listed below for reference.      Labs:   Basic Metabolic Panel: Recent Labs  Lab 03/19/22 0606 03/20/22 0405 03/21/22 0617 03/22/22 0439 03/23/22 0500  NA 130* 126* 131* 131* 128*  K 3.9 4.0 3.3* 3.6 4.0  CL 96* 91* 98 97* 95*  CO2 '26 27 28 27 25  '$ GLUCOSE 157* 164* 146* 183* 363*  BUN <5* <5* 5* 5* 7*  CREATININE 0.51 0.53 0.59 0.54 0.65  CALCIUM 7.8* 8.2* 7.9* 8.0* 8.2*  MG 1.7 1.7  --  1.6* 2.3    CBC: Recent Labs  Lab 03/21/22 0617 03/22/22 0439 03/23/22 0500 03/24/22 0313  WBC 11.4* 8.7 7.8 14.2*  HGB 9.8* 9.2* 10.0* 9.1*  HCT 29.2* 28.1* 31.1* 27.7*  MCV 84.6 86.5 87.4 86.6  PLT 319 300 347 299      CBG: Recent Labs  Lab 03/23/22 1127 03/23/22 1740 03/23/22 2143 03/24/22 0726 03/24/22 1140  GLUCAP 286* 263* 143* 175* 171*     IMAGING STUDIES DG Abd 2 Views  Result Date: 03/14/2022 CLINICAL DATA:  Duodenal stent placement EXAM: ABDOMEN - 2 VIEW COMPARISON:  CT abdomen and pelvis 03/04/2022 FINDINGS: Interval placement of a duodenal stent. The stent projects over the expected course of the duodenal sweep. Redemonstrated biliary stent. Nonobstructive bowel-gas pattern. Tubular marker near the GE junction. Large phleboliths versus fibroid pelvis. IMPRESSION: Interval placement of duodenal stent in expected position. Electronically Signed   By: Placido Sou M.D.   On: 03/14/2022 08:34   DG C-Arm 1-60 Min  Result Date: 03/12/2022 CLINICAL DATA:  Fluoroscopic assistance for placement of stent EXAM: DG C-ARM 1-60 MIN FLUOROSCOPY: Fluoroscopy Time:  90 seconds Radiation Exposure Index (if provided by the fluoroscopic device): 16.5 Number of Acquired Spot Images: 3 COMPARISON:  CT done on 03/04/2022 FINDINGS: Theophilus Kinds is noted in place. In the early images, biliary stent is noted in the course of the common bile duct. In the final image, there is a new stent oriented transversely, possibly in pancreatic duct or bowel lumen. IMPRESSION: Fluoroscopic assistance was provided for stent placement. Electronically Signed   By: Elmer Picker M.D.   On: 03/12/2022 11:13   CT ABDOMEN PELVIS W CONTRAST  Result Date: 03/04/2022 CLINICAL DATA:  Nausea, vomiting, unspecified abdominal pain EXAM: CT ABDOMEN AND PELVIS WITH CONTRAST TECHNIQUE: Multidetector CT imaging of the abdomen and pelvis was performed using the standard protocol following bolus administration of intravenous contrast. RADIATION DOSE REDUCTION: This exam was performed according to the departmental dose-optimization program which includes automated exposure control, adjustment of the mA and/or kV according to patient size and/or use of iterative reconstruction technique. CONTRAST:  147m OMNIPAQUE IOHEXOL 300 MG/ML  SOLN COMPARISON:  02/14/2022 FINDINGS: Lower chest: No acute abnormality. Hepatobiliary: Palliative metallic biliary stent within the common duct extending through  the ampulla. Pneumobilia is in keeping with patency of the a stented segment. Mild intra and extrahepatic biliary ductal dilation is unchanged. Previously noted hepatic abscess drainage catheter has been removed in the interval. Small wedge like region of non enhancement within the left hepatic lobe likely represents a small amount of residual scarring. No focal intrahepatic mass. Gas within the gallbladder lumen again  noted. The gallbladder is not distended no pericholecystic inflammatory changes identified. Pancreas: Partially calcified pancreatic head mass is again seen, grossly unchanged from prior examination but poorly delineated on this examination. Dilation of the pancreatic duct distal to the mass is again seen with associated pancreatic parenchymal atrophy. No superimposed peripancreatic inflammatory changes. Spleen: Unremarkable Adrenals/Urinary Tract: The adrenal glands are unremarkable. The kidneys are normal in size and position. 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 The kidneys are otherwise unremarkable. The bladder is unremarkable. Stomach/Bowel: Stomach is within normal limits. Appendix appears normal. No evidence of bowel wall thickening, distention, or inflammatory changes. No free intraperitoneal gas or fluid. Vascular/Lymphatic: Occlusion of the terminal superior mesenteric vein and splenic vein is again identified in the region of the pancreatic mass. The main portal vein remains patent, reconstituted via the left gastric vein. Moderate aortoiliac atherosclerotic calcification. No aortic aneurysm. No pathologic adenopathy within the abdomen and pelvis. Stable shotty periceliac adenopathy, nonspecific, measuring up to 9 mm in short axis diameter at axial image # 20/2. Reproductive: Uterus and bilateral adnexa are unremarkable. Other: No abdominal wall hernia Musculoskeletal: No acute bone abnormality. No lytic or blastic bone lesion. IMPRESSION: 1. No acute intra-abdominal pathology identified. No definite radiographic explanation for the patient's reported symptoms. 2. Interval removal of hepatic abscess drainage catheter. No recurrent abscess identified. Wedge like region of probable fibrosis in the area of prior abscess. 3. Stable mild intra and extrahepatic biliary ductal dilation. Palliative metallic biliary stent remains in place. Pneumobilia in keeping  with patency of the stented segment. 4. Grossly stable pancreatic head mass with associated occlusion of the terminal superior mesenteric vein and splenic vein. 5. 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 Electronically Signed   By: Fidela Salisbury M.D.   On: 03/04/2022 19:55    DISCHARGE EXAMINATION: Vitals:   03/23/22 1131 03/23/22 1941 03/24/22 0419 03/24/22 1039  BP: 120/68 (!) 129/92 (!) 126/91 128/66  Pulse: 89 96 93 94  Resp: '14 17 18   '$ Temp: 97.6 F (36.4 C) 98.2 F (36.8 C) 97.7 F (36.5 C)   TempSrc: Oral Oral    SpO2: 97% 99% 98% 97%  Weight:      Height:       General appearance: Awake alert.  In no distress Resp: Clear to auscultation bilaterally.  Normal effort Cardio: S1-S2 is normal regular.  No S3-S4.  No rubs murmurs or bruit GI: Abdomen is soft.  Less tender.    DISPOSITION: Home  Discharge Instructions     Call MD for:  difficulty breathing, headache or visual disturbances   Complete by: As directed    Call MD for:  extreme fatigue   Complete by: As directed    Call MD for:  persistant dizziness or light-headedness   Complete by: As directed    Call MD for:  persistant nausea and vomiting   Complete by: As directed    Call MD for:  severe uncontrolled pain   Complete by: As directed    Call MD for:  temperature >100.4  Complete by: As directed    Diet - low sodium heart healthy   Complete by: As directed    Discharge instructions   Complete by: As directed    Please be sure to follow-up with Dr. Marin Olp in the next 1 to 2 weeks.  Please reach out to his office if there are any problems with the pain medication prescriptions.  You were cared for by a hospitalist during your hospital stay. If you have any questions about your discharge medications or the care you received while you were in the hospital after you are discharged, you can call the unit and asked to speak with the hospitalist on call if the hospitalist that  took care of you is not available. Once you are discharged, your primary care physician will handle any further medical issues. Please note that NO REFILLS for any discharge medications will be authorized once you are discharged, as it is imperative that you return to your primary care physician (or establish a relationship with a primary care physician if you do not have one) for your aftercare needs so that they can reassess your need for medications and monitor your lab values. If you do not have a primary care physician, you can call [phone] for a physician referral.   Increase activity slowly   Complete by: As directed           Allergies as of 03/24/2022       Reactions   Lisinopril Swelling, Other (See Comments)   Facial and upper lip        Medication List     STOP taking these medications    aluminum-magnesium hydroxide-simethicone 200-200-20 MG/5ML Susp Commonly known as: MAALOX   fluticasone 50 MCG/ACT nasal spray Commonly known as: FLONASE   glucose blood test strip   lidocaine-prilocaine cream Commonly known as: EMLA   metoprolol succinate 25 MG 24 hr tablet Commonly known as: TOPROL-XL   olmesartan 5 MG tablet Commonly known as: BENICAR   potassium chloride SA 20 MEQ tablet Commonly known as: KLOR-CON M   prochlorperazine 10 MG tablet Commonly known as: COMPAZINE   sulfamethoxazole-trimethoprim 800-160 MG tablet Commonly known as: BACTRIM DS       TAKE these medications    ALPRAZolam 1 MG tablet Commonly known as: XANAX Take 0.5 tablets (0.5 mg total) by mouth every 8 (eight) hours as needed for anxiety.   amLODipine 10 MG tablet Commonly known as: NORVASC Take 1 tablet (10 mg total) by mouth at bedtime.   apixaban 5 MG Tabs tablet Commonly known as: Eliquis Take 1 tablet (5 mg total) by mouth 2 (two) times daily.   atorvastatin 20 MG tablet Commonly known as: LIPITOR Take 1 tablet (20 mg total) by mouth at bedtime.   Creon 36000  UNITS Cpep capsule Generic drug: lipase/protease/amylase TAKE 2 CAPSULES BY MOUTH THREE TIMES DAILY BEFORE MEAL(S) What changed: See the new instructions.   fentaNYL 25 MCG/HR Commonly known as: [place] 2 patches onto the skin every 3 (three) days. What changed: how much to take   gabapentin 400 MG capsule Commonly known as: NEURONTIN Take 1 capsule (400 mg total) by mouth 3 (three) times daily.   glipiZIDE 5 MG 24 hr tablet Commonly known as: GLUCOTROL XL Take 1 tablet (5 mg total) by mouth daily with breakfast.   HYDROmorphone 4 MG tablet Commonly known as: Dilaudid Take 1 tablet (4 mg total) by mouth every 4 (four) hours as needed for severe pain.  loperamide 2 MG capsule Commonly known as: IMODIUM Take 2 capsules (4 mg total) by mouth 3 (three) times daily as needed for diarrhea or loose stools.   metFORMIN 750 MG 24 hr tablet Commonly known as: Glucophage XR Take 1 tablet (750 mg total) by mouth daily with breakfast.   ondansetron 8 MG tablet Commonly known as: Zofran Take 1 tablet (8 mg total) by mouth 2 (two) times daily as needed (Nausea or vomiting).   pantoprazole 40 MG tablet Commonly known as: Protonix Take 1 tablet (40 mg total) by mouth daily. What changed: Another medication with the same name was removed. Continue taking this medication, and follow the directions you see here.   sodium chloride 1 g tablet Take 1 tablet (1 g total) by mouth 2 (two) times daily with a meal.   sucralfate 1 g tablet Commonly known as: CARAFATE Take 1 tablet (1 g total) by mouth 2 (two) times daily.          Follow-up Information     Volanda Napoleon, MD Follow up.   Specialty: Oncology Contact information: [address] [place] [address] [phone]                 TOTAL DISCHARGE TIME: 75 minutes  [facility] Pager on [email or web address]  03/25/2022, 11:33 AM

## 2022-03-24 NOTE — Progress Notes (Signed)
No changes from am assessment. The patient is alert, oriented x4, ambulatory without assistance. Discharge instructions were reviewed, questions concerns were denied at this time. The pt was encouraged to follow up with primary care provider and oncologist within 7-10 days. Medications due this evening sucralfate, gabapentin, creon, and apixaban.

## 2022-03-25 ENCOUNTER — Other Ambulatory Visit: Payer: Self-pay | Admitting: Family Medicine

## 2022-03-25 ENCOUNTER — Encounter (HOSPITAL_COMMUNITY): Payer: Self-pay | Admitting: Gastroenterology

## 2022-03-26 ENCOUNTER — Other Ambulatory Visit: Payer: Self-pay | Admitting: Nurse Practitioner

## 2022-03-26 ENCOUNTER — Telehealth: Payer: Self-pay | Admitting: *Deleted

## 2022-03-26 ENCOUNTER — Other Ambulatory Visit: Payer: Self-pay | Admitting: Family Medicine

## 2022-03-26 DIAGNOSIS — E1169 Type 2 diabetes mellitus with other specified complication: Secondary | ICD-10-CM

## 2022-03-26 NOTE — Telephone Encounter (Signed)
Message received from patient stating that she is having increased diarrhea and would like to know what to do.  Call placed back to patient and patient states that she has had diarrhea times four today after eating bacon and ensure.  Pt instructed to take her Imodium as ordered and to call office back in the morning if diarrhea continues.  Pt states that she is eating and drinking without difficulty and no nausea. Pt does state that she will call office back in the morning if diarrhea continues.

## 2022-03-26 NOTE — Telephone Encounter (Signed)
Chart supports Rx Last OV: 11/2021 Next OV: 03/2022

## 2022-03-27 ENCOUNTER — Telehealth: Payer: Self-pay

## 2022-03-27 ENCOUNTER — Other Ambulatory Visit: Payer: Self-pay

## 2022-03-27 ENCOUNTER — Inpatient Hospital Stay (HOSPITAL_COMMUNITY)
Admission: EM | Admit: 2022-03-27 | Discharge: 2022-04-05 | DRG: 371 | Disposition: A | Discharge status: Home / Self Care | Payer: BC Managed Care – PPO | Attending: Internal Medicine | Admitting: Internal Medicine

## 2022-03-27 ENCOUNTER — Emergency Department (HOSPITAL_COMMUNITY): Payer: BC Managed Care – PPO

## 2022-03-27 ENCOUNTER — Telehealth: Payer: Self-pay | Admitting: Nurse Practitioner

## 2022-03-27 DIAGNOSIS — Z87891 Personal history of nicotine dependence: Secondary | ICD-10-CM

## 2022-03-27 DIAGNOSIS — A0472 Enterocolitis due to Clostridium difficile, not specified as recurrent: Secondary | ICD-10-CM | POA: Diagnosis not present

## 2022-03-27 DIAGNOSIS — D638 Anemia in other chronic diseases classified elsewhere: Secondary | ICD-10-CM | POA: Diagnosis present

## 2022-03-27 DIAGNOSIS — E876 Hypokalemia: Secondary | ICD-10-CM | POA: Diagnosis present

## 2022-03-27 DIAGNOSIS — Z888 Allergy status to other drugs, medicaments and biological substances status: Secondary | ICD-10-CM

## 2022-03-27 DIAGNOSIS — K8689 Other specified diseases of pancreas: Secondary | ICD-10-CM | POA: Diagnosis present

## 2022-03-27 DIAGNOSIS — Z8619 Personal history of other infectious and parasitic diseases: Secondary | ICD-10-CM

## 2022-03-27 DIAGNOSIS — K55069 Acute infarction of intestine, part and extent unspecified: Secondary | ICD-10-CM | POA: Diagnosis present

## 2022-03-27 DIAGNOSIS — D630 Anemia in neoplastic disease: Secondary | ICD-10-CM | POA: Diagnosis present

## 2022-03-27 DIAGNOSIS — Z79899 Other long term (current) drug therapy: Secondary | ICD-10-CM

## 2022-03-27 DIAGNOSIS — Z794 Long term (current) use of insulin: Secondary | ICD-10-CM

## 2022-03-27 DIAGNOSIS — E860 Dehydration: Secondary | ICD-10-CM | POA: Diagnosis present

## 2022-03-27 DIAGNOSIS — I10 Essential (primary) hypertension: Secondary | ICD-10-CM | POA: Diagnosis present

## 2022-03-27 DIAGNOSIS — Z806 Family history of leukemia: Secondary | ICD-10-CM

## 2022-03-27 DIAGNOSIS — K529 Noninfective gastroenteritis and colitis, unspecified: Secondary | ICD-10-CM

## 2022-03-27 DIAGNOSIS — K55059 Acute (reversible) ischemia of intestine, part and extent unspecified: Secondary | ICD-10-CM | POA: Diagnosis present

## 2022-03-27 DIAGNOSIS — I81 Portal vein thrombosis: Secondary | ICD-10-CM | POA: Diagnosis present

## 2022-03-27 DIAGNOSIS — K7460 Unspecified cirrhosis of liver: Secondary | ICD-10-CM | POA: Diagnosis present

## 2022-03-27 DIAGNOSIS — C259 Malignant neoplasm of pancreas, unspecified: Secondary | ICD-10-CM | POA: Diagnosis present

## 2022-03-27 DIAGNOSIS — Z833 Family history of diabetes mellitus: Secondary | ICD-10-CM

## 2022-03-27 DIAGNOSIS — C251 Malignant neoplasm of body of pancreas: Secondary | ICD-10-CM

## 2022-03-27 DIAGNOSIS — R1084 Generalized abdominal pain: Secondary | ICD-10-CM | POA: Diagnosis present

## 2022-03-27 DIAGNOSIS — Z8249 Family history of ischemic heart disease and other diseases of the circulatory system: Secondary | ICD-10-CM

## 2022-03-27 DIAGNOSIS — E782 Mixed hyperlipidemia: Secondary | ICD-10-CM | POA: Diagnosis present

## 2022-03-27 DIAGNOSIS — R197 Diarrhea, unspecified: Secondary | ICD-10-CM | POA: Diagnosis present

## 2022-03-27 DIAGNOSIS — B370 Candidal stomatitis: Secondary | ICD-10-CM | POA: Diagnosis not present

## 2022-03-27 DIAGNOSIS — E222 Syndrome of inappropriate secretion of antidiuretic hormone: Secondary | ICD-10-CM | POA: Diagnosis present

## 2022-03-27 DIAGNOSIS — E1165 Type 2 diabetes mellitus with hyperglycemia: Secondary | ICD-10-CM | POA: Diagnosis present

## 2022-03-27 DIAGNOSIS — Z7901 Long term (current) use of anticoagulants: Secondary | ICD-10-CM

## 2022-03-27 DIAGNOSIS — A0811 Acute gastroenteropathy due to Norwalk agent: Secondary | ICD-10-CM | POA: Diagnosis present

## 2022-03-27 DIAGNOSIS — E871 Hypo-osmolality and hyponatremia: Secondary | ICD-10-CM | POA: Diagnosis present

## 2022-03-27 DIAGNOSIS — Z7984 Long term (current) use of oral hypoglycemic drugs: Secondary | ICD-10-CM

## 2022-03-27 HISTORY — DX: Noninfective gastroenteritis and colitis, unspecified: K52.9

## 2022-03-27 LAB — COMPREHENSIVE METABOLIC PANEL
ALT: 24 U/L (ref 0–44)
AST: 23 U/L (ref 15–41)
Albumin: 2.8 g/dL — ABNORMAL LOW (ref 3.5–5.0)
Alkaline Phosphatase: 71 U/L (ref 38–126)
Anion gap: 7 (ref 5–15)
BUN: 12 mg/dL (ref 8–23)
CO2: 23 mmol/L (ref 22–32)
Calcium: 8.3 mg/dL — ABNORMAL LOW (ref 8.9–10.3)
Chloride: 98 mmol/L (ref 98–111)
Creatinine, Ser: 0.73 mg/dL (ref 0.44–1.00)
GFR, Estimated: >60 mL/min (ref >60)
Glucose, Bld: 219 mg/dL — ABNORMAL HIGH (ref 70–99)
Potassium: 3.7 mmol/L (ref 3.5–5.1)
Sodium: 128 mmol/L — ABNORMAL LOW (ref 135–145)
Total Bilirubin: 0.6 mg/dL (ref 0.3–1.2)
Total Protein: 6.5 g/dL (ref 6.5–8.1)

## 2022-03-27 LAB — URINALYSIS, ROUTINE W REFLEX MICROSCOPIC
Bilirubin Urine: NEGATIVE
Glucose, UA: NEGATIVE mg/dL
Hgb urine dipstick: NEGATIVE
Ketones, ur: NEGATIVE mg/dL
Leukocytes,Ua: NEGATIVE
Nitrite: NEGATIVE
Protein, ur: NEGATIVE mg/dL
Specific Gravity, Urine: 1.003 — ABNORMAL LOW (ref 1.005–1.030)
pH: 5 (ref 5.0–8.0)

## 2022-03-27 LAB — CBC WITH DIFFERENTIAL/PLATELET
Abs Immature Granulocytes: 0.05 10*3/uL (ref 0.00–0.07)
Basophils Absolute: 0 10*3/uL (ref 0.0–0.1)
Basophils Relative: 0 %
Eosinophils Absolute: 0 10*3/uL (ref 0.0–0.5)
Eosinophils Relative: 0 %
HCT: 29.4 % — ABNORMAL LOW (ref 36.0–46.0)
Hemoglobin: 9.4 g/dL — ABNORMAL LOW (ref 12.0–15.0)
Immature Granulocytes: 0 %
Lymphocytes Relative: 7 %
Lymphs Abs: 1 10*3/uL (ref 0.7–4.0)
MCH: 28.3 pg (ref 26.0–34.0)
MCHC: 32 g/dL (ref 30.0–36.0)
MCV: 88.6 fL (ref 80.0–100.0)
Monocytes Absolute: 0.6 10*3/uL (ref 0.1–1.0)
Monocytes Relative: 4 %
Neutro Abs: 12.5 10*3/uL — ABNORMAL HIGH (ref 1.7–7.7)
Neutrophils Relative %: 89 %
Platelets: 293 10*3/uL (ref 150–400)
RBC: 3.32 MIL/uL — ABNORMAL LOW (ref 3.87–5.11)
RDW: 15.5 % (ref 11.5–15.5)
WBC: 14.2 10*3/uL — ABNORMAL HIGH (ref 4.0–10.5)
nRBC: 0 % (ref 0.0–0.2)

## 2022-03-27 MED ORDER — PANTOPRAZOLE SODIUM 40 MG IV SOLR
40.0000 mg | INTRAVENOUS | Status: DC
  Administered 2022-03-28 – 2022-03-30 (×4): 40 mg via INTRAVENOUS
  Filled 2022-03-27 (×4): qty 10

## 2022-03-27 MED ORDER — PANCRELIPASE (LIP-PROT-AMYL) 12000-38000 UNITS PO CPEP
72000.0000 [IU] | ORAL_CAPSULE | Freq: Three times a day (TID) | ORAL | Status: DC
  Administered 2022-03-28 – 2022-04-05 (×26): 72000 [IU] via ORAL
  Filled 2022-03-27 (×10): qty 6
  Filled 2022-03-27: qty 2
  Filled 2022-03-27 (×17): qty 6

## 2022-03-27 MED ORDER — IOHEXOL 300 MG/ML  SOLN
100.0000 mL | Freq: Once | INTRAMUSCULAR | Status: AC | PRN
  Administered 2022-03-27: 100 mL via INTRAVENOUS

## 2022-03-27 MED ORDER — ATORVASTATIN CALCIUM 20 MG PO TABS
20.0000 mg | ORAL_TABLET | Freq: Every day | ORAL | Status: DC
  Administered 2022-03-28 – 2022-04-04 (×9): 20 mg via ORAL
  Filled 2022-03-27 (×6): qty 1
  Filled 2022-03-27: qty 2
  Filled 2022-03-27 (×2): qty 1

## 2022-03-27 MED ORDER — DIPHENOXYLATE-ATROPINE 2.5-0.025 MG PO TABS: 1.0000 | ORAL_TABLET | Freq: Four times a day (QID) | ORAL | 0 refills | Status: DC | PRN

## 2022-03-27 MED ORDER — ONDANSETRON HCL 4 MG PO TABS
4.0000 mg | ORAL_TABLET | Freq: Four times a day (QID) | ORAL | Status: DC | PRN
  Administered 2022-04-01 – 2022-04-03 (×3): 4 mg via ORAL
  Filled 2022-03-27 (×3): qty 1

## 2022-03-27 MED ORDER — POLYETHYLENE GLYCOL 3350 17 G PO PACK: 17.0000 g | PACK | Freq: Every day | ORAL | Status: DC | PRN

## 2022-03-27 MED ORDER — ACETAMINOPHEN 325 MG PO TABS: 650.0000 mg | ORAL_TABLET | Freq: Four times a day (QID) | ORAL | Status: DC | PRN

## 2022-03-27 MED ORDER — INSULIN ASPART 100 UNIT/ML IJ SOLN
0.0000 [IU] | Freq: Three times a day (TID) | INTRAMUSCULAR | Status: DC
  Administered 2022-03-28: 2 [IU] via SUBCUTANEOUS
  Administered 2022-03-28: 3 [IU] via SUBCUTANEOUS
  Administered 2022-03-28 – 2022-03-29 (×2): 2 [IU] via SUBCUTANEOUS
  Administered 2022-03-29: 5 [IU] via SUBCUTANEOUS
  Administered 2022-03-29 – 2022-03-30 (×3): 3 [IU] via SUBCUTANEOUS
  Administered 2022-03-30: 2 [IU] via SUBCUTANEOUS
  Administered 2022-03-30: 8 [IU] via SUBCUTANEOUS
  Administered 2022-03-31: 5 [IU] via SUBCUTANEOUS
  Administered 2022-03-31: 2 [IU] via SUBCUTANEOUS
  Administered 2022-03-31: 5 [IU] via SUBCUTANEOUS
  Administered 2022-03-31: 2 [IU] via SUBCUTANEOUS
  Administered 2022-04-01 (×2): 3 [IU] via SUBCUTANEOUS
  Administered 2022-04-01: 5 [IU] via SUBCUTANEOUS
  Administered 2022-04-02 (×2): 2 [IU] via SUBCUTANEOUS
  Administered 2022-04-02: 5 [IU] via SUBCUTANEOUS
  Administered 2022-04-02: 3 [IU] via SUBCUTANEOUS
  Administered 2022-04-03: 5 [IU] via SUBCUTANEOUS
  Administered 2022-04-03 (×2): 2 [IU] via SUBCUTANEOUS
  Administered 2022-04-04: 5 [IU] via SUBCUTANEOUS
  Administered 2022-04-04: 2 [IU] via SUBCUTANEOUS
  Administered 2022-04-04: 8 [IU] via SUBCUTANEOUS
  Administered 2022-04-05: 2 [IU] via SUBCUTANEOUS
  Administered 2022-04-05: 3 [IU] via SUBCUTANEOUS
  Filled 2022-03-27: qty 0.15

## 2022-03-27 MED ORDER — HYDROMORPHONE HCL 1 MG/ML IJ SOLN
1.0000 mg | INTRAMUSCULAR | Status: DC | PRN
  Administered 2022-03-28 – 2022-03-31 (×9): 1 mg via INTRAVENOUS
  Filled 2022-03-27 (×9): qty 1

## 2022-03-27 MED ORDER — HYDRALAZINE HCL 20 MG/ML IJ SOLN: 10.0000 mg | Freq: Four times a day (QID) | INTRAMUSCULAR | Status: DC | PRN

## 2022-03-27 MED ORDER — APIXABAN 2.5 MG PO TABS
5.0000 mg | ORAL_TABLET | Freq: Two times a day (BID) | ORAL | Status: DC
  Administered 2022-03-28 – 2022-04-05 (×18): 5 mg via ORAL
  Filled 2022-03-27 (×9): qty 2
  Filled 2022-03-27: qty 1
  Filled 2022-03-27 (×8): qty 2

## 2022-03-27 MED ORDER — HYDROMORPHONE HCL 2 MG PO TABS
4.0000 mg | ORAL_TABLET | ORAL | Status: DC | PRN
  Administered 2022-03-30 – 2022-04-05 (×34): 4 mg via ORAL
  Filled 2022-03-27 (×34): qty 2

## 2022-03-27 MED ORDER — LACTATED RINGERS IV BOLUS
1000.0000 mL | Freq: Once | INTRAVENOUS | Status: AC
  Administered 2022-03-27: 1000 mL via INTRAVENOUS

## 2022-03-27 MED ORDER — AMLODIPINE BESYLATE 10 MG PO TABS
10.0000 mg | ORAL_TABLET | Freq: Every day | ORAL | Status: DC
  Administered 2022-03-28 – 2022-04-04 (×9): 10 mg via ORAL
  Filled 2022-03-27 (×4): qty 1
  Filled 2022-03-27: qty 2
  Filled 2022-03-27 (×4): qty 1

## 2022-03-27 MED ORDER — ACETAMINOPHEN 650 MG RE SUPP: 650.0000 mg | Freq: Four times a day (QID) | RECTAL | Status: DC | PRN

## 2022-03-27 MED ORDER — GABAPENTIN 400 MG PO CAPS
400.0000 mg | ORAL_CAPSULE | Freq: Three times a day (TID) | ORAL | Status: DC
  Administered 2022-03-28 – 2022-04-05 (×26): 400 mg via ORAL
  Filled 2022-03-27 (×26): qty 1

## 2022-03-27 MED ORDER — FENTANYL 25 MCG/HR TD PT72
2.0000 | MEDICATED_PATCH | TRANSDERMAL | Status: DC
  Administered 2022-03-28: 2 via TRANSDERMAL
  Filled 2022-03-27 (×2): qty 1

## 2022-03-27 MED ORDER — SODIUM CHLORIDE 0.9% FLUSH
3.0000 mL | Freq: Two times a day (BID) | INTRAVENOUS | Status: DC
  Administered 2022-03-28 – 2022-04-05 (×8): 3 mL via INTRAVENOUS

## 2022-03-27 MED ORDER — ONDANSETRON HCL 4 MG/2ML IJ SOLN
4.0000 mg | Freq: Once | INTRAMUSCULAR | Status: AC
  Administered 2022-03-27: 4 mg via INTRAVENOUS
  Filled 2022-03-27: qty 2

## 2022-03-27 MED ORDER — SODIUM CHLORIDE 1 G PO TABS
1.0000 g | ORAL_TABLET | Freq: Two times a day (BID) | ORAL | Status: DC
  Administered 2022-03-28 – 2022-04-05 (×17): 1 g via ORAL
  Filled 2022-03-27 (×19): qty 1

## 2022-03-27 MED ORDER — MORPHINE SULFATE (PF) 4 MG/ML IV SOLN
4.0000 mg | Freq: Once | INTRAVENOUS | Status: AC
  Administered 2022-03-27: 4 mg via INTRAVENOUS
  Filled 2022-03-27: qty 1

## 2022-03-27 MED ORDER — ONDANSETRON HCL 4 MG/2ML IJ SOLN
4.0000 mg | Freq: Four times a day (QID) | INTRAMUSCULAR | Status: DC | PRN
  Administered 2022-03-28: 4 mg via INTRAVENOUS
  Filled 2022-03-27: qty 2

## 2022-03-27 NOTE — Assessment & Plan Note (Signed)
.   Continuing home regimen of lipid lowering therapy.  

## 2022-03-27 NOTE — Assessment & Plan Note (Signed)
   Recently placed on Creon therapy at the recommendation of gastroenterology which will be continued at this time

## 2022-03-27 NOTE — Assessment & Plan Note (Signed)
   Continue previously prescribed regimen of Eliquis

## 2022-03-27 NOTE — Telephone Encounter (Signed)
Received VM from pt stating "I've been calling all day about my diarrhea and nobody will give me an answer."  Called pt back and relayed message that was left earlier today. Pt is tearful, stating "does he not think I need to go back to the hospital? I'm getting really weak and I'm worried." Pt does report pushing po fluids and being able to eat bland foods today. Encouraged pt to try adding Lomotil to bowel regimen. Instructed pt that if she feels poorly enough to go to the hospital, then we would not discourage her from going. Dr Marin Olp aware. dph

## 2022-03-27 NOTE — ED Triage Notes (Addendum)
Pt reports she was recently discharged from the hospital. Pt reports abdominal pain, nausea, vomiting, and diarrhea since being discharged. Reports last chemo treatment was about 1 month ago. Denies any fevers.

## 2022-03-27 NOTE — Assessment & Plan Note (Signed)
   No clinical evidence of bleeding  Unremarkable vitamin B12, iron panel, folate via workup in July  Monitor hemoglobin and hematocrit with serial CBCs

## 2022-03-27 NOTE — ED Provider Notes (Cosign Needed Addendum)
[place] DEPT Provider Note   CSN: [id number] Arrival date & time: 03/27/22  1518     History  Chief Complaint  Patient presents with   Abdominal Pain    Post    Post-op Problem   Nausea   Emesis   Diarrhea    Melanie Alvarez is a 66 y.o. female with a history of pancreatic cancer who comes to the emergency department for 4 days of worsening abdominal pain, diarrhea, associated with nausea and 1 episode of vomiting.  She was discharged from the hospital on Sunday after evaluation and treatment for duodenal obstruction secondary to pancreatic cancer.  While hospitalized she received duodenal stent and celiac plexus block.  Since discharge her symptoms have not improved.  Abdominal pain is poorly localized.  She is having upwards of 5 large-volume watery stools per day.  This morning she felt nauseous and had 1 episode of vomiting.  She denies bleeding.  She denies fevers.  She still has an appetite.     Abdominal Pain Associated symptoms: diarrhea and vomiting   Emesis Associated symptoms: abdominal pain and diarrhea   Diarrhea Associated symptoms: abdominal pain and vomiting        Home Medications Prior to Admission medications   Medication Sig Start Date End Date Taking? Authorizing Provider  ALPRAZolam Duanne Moron) 1 MG tablet Take 0.5 tablets (0.5 mg total) by mouth every 8 (eight) hours as needed for anxiety. 02/11/22  Yes Volanda Napoleon, MD  amLODipine (NORVASC) 10 MG tablet Take 1 tablet (10 mg total) by mouth at bedtime. 11/27/21  Yes Nche, Charlene Brooke, NP  apixaban (ELIQUIS) 5 MG TABS tablet Take 1 tablet (5 mg total) by mouth 2 (two) times daily. 03/24/22  Yes Bonnielee Haff, MD  atorvastatin (LIPITOR) 20 MG tablet Take 1 tablet (20 mg total) by mouth at bedtime. 01/25/21  Yes Nche, Charlene Brooke, NP  CREON 36000-114000 units CPEP capsule TAKE 2 CAPSULES BY MOUTH THREE TIMES DAILY BEFORE MEAL(S) Patient taking differently: Take 72,000  Units by mouth 3 (three) times daily before meals. 02/01/22  Yes Volanda Napoleon, MD  diphenoxylate-atropine (LOMOTIL) 2.5-0.025 MG tablet Take 1 tablet by mouth 4 (four) times daily as needed for diarrhea or loose stools. 03/27/22  Yes Volanda Napoleon, MD  fentaNYL (DURAGESIC) 25 MCG/HR Place 2 patches onto the skin every 3 (three) days. Patient taking differently: Place 1-2 patches onto the skin every 3 (three) days. 03/24/22  Yes Bonnielee Haff, MD  gabapentin (NEURONTIN) 400 MG capsule Take 1 capsule (400 mg total) by mouth 3 (three) times daily. 03/24/22  Yes Bonnielee Haff, MD  glipiZIDE (GLUCOTROL XL) 5 MG 24 hr tablet Take 1 tablet (5 mg total) by mouth daily with breakfast. 09/14/21  Yes Nche, Charlene Brooke, NP  HYDROmorphone (DILAUDID) 4 MG tablet Take 1 tablet (4 mg total) by mouth every 4 (four) hours as needed for severe pain. 03/04/22  Yes Ennever, Rudell Cobb, MD  loperamide (IMODIUM) 2 MG capsule Take 2 capsules (4 mg total) by mouth 3 (three) times daily as needed for diarrhea or loose stools. 03/24/22  Yes Bonnielee Haff, MD  meclizine (ANTIVERT) 25 MG tablet Take 25 mg by mouth 3 (three) times daily as needed for dizziness. 03/25/22  Yes [provider]  ondansetron (ZOFRAN) 8 MG tablet Take 1 tablet (8 mg total) by mouth 2 (two) times daily as needed (Nausea or vomiting). 03/04/22  Yes Vicie Mutters R, PA-C  sodium chloride 1 g  tablet Take 1 tablet (1 g total) by mouth 2 (two) times daily with a meal. 03/24/22  Yes Bonnielee Haff, MD  metFORMIN (GLUCOPHAGE-XR) 750 MG 24 hr tablet Take 1 tablet by mouth once daily with breakfast 03/26/22   Nche, Charlene Brooke, NP  pantoprazole (PROTONIX) 40 MG tablet Take 1 tablet (40 mg total) by mouth daily. 12/24/21   Volanda Napoleon, MD  sucralfate (CARAFATE) 1 g tablet Take 1 tablet (1 g total) by mouth 2 (two) times daily. 03/04/22   Vladimir Crofts, PA-C      Allergies    Lisinopril    Review of Systems   Review of Systems  Gastrointestinal:   Positive for abdominal pain, diarrhea and vomiting.  All other systems reviewed and are negative.   Physical Exam Updated Vital Signs BP (!) 146/99   Pulse 97   Temp 97.8 F (36.6 C) (Oral)   Resp 16   Ht 5' 3.5" (1.613 m)   Wt 66.7 kg   SpO2 100%   BMI 25.63 kg/m  Physical Exam Vitals and nursing note reviewed.  Constitutional:      General: She is not in acute distress.    Appearance: She is well-developed.     Comments: Uncomfortable appearing.  HENT:     Head: Normocephalic and atraumatic.  Eyes:     Conjunctiva/sclera: Conjunctivae normal.  Cardiovascular:     Rate and Rhythm: Regular rhythm. Tachycardia present.     Heart sounds: No murmur heard. Pulmonary:     Effort: Pulmonary effort is normal. No respiratory distress.     Breath sounds: Normal breath sounds.  Abdominal:     General: Bowel sounds are normal.     Palpations: Abdomen is soft.     Tenderness: There is abdominal tenderness (Diffusely tender to light palpation.).  Musculoskeletal:        General: No swelling.     Cervical back: Neck supple.  Skin:    General: Skin is warm and dry.     Capillary Refill: Capillary refill takes less than 2 seconds.  Neurological:     Mental Status: She is alert.  Psychiatric:        Mood and Affect: Mood normal.     ED Results / Procedures / Treatments   Labs (all labs ordered are listed, but only abnormal results are displayed) Labs Reviewed  COMPREHENSIVE METABOLIC PANEL - Abnormal; Notable for the following components:      Result Value   Sodium 128 (*)    Glucose, Bld 219 (*)    Calcium 8.3 (*)    Albumin 2.8 (*)    All other components within normal limits  CBC WITH DIFFERENTIAL/PLATELET - Abnormal; Notable for the following components:   WBC 14.2 (*)    RBC 3.32 (*)    Hemoglobin 9.4 (*)    HCT 29.4 (*)    Neutro Abs 12.5 (*)    All other components within normal limits  URINALYSIS, ROUTINE W REFLEX MICROSCOPIC - Abnormal; Notable for the  following components:   Color, Urine STRAW (*)    Specific Gravity, Urine 1.003 (*)    All other components within normal limits  C DIFFICILE QUICK SCREEN W PCR REFLEX    GASTROINTESTINAL PANEL BY PCR, STOOL (REPLACES STOOL CULTURE)    EKG None  Radiology CT Abdomen Pelvis W Contrast  Result Date: 03/27/2022 CLINICAL DATA:  History of pancreatic cancer, abdominal pain, nausea and vomiting, diarrhea EXAM: CT ABDOMEN AND PELVIS WITH CONTRAST  TECHNIQUE: Multidetector CT imaging of the abdomen and pelvis was performed using the standard protocol following bolus administration of intravenous contrast. RADIATION DOSE REDUCTION: This exam was performed according to the departmental dose-optimization program which includes automated exposure control, adjustment of the mA and/or kV according to patient size and/or use of iterative reconstruction technique. CONTRAST:  153m OMNIPAQUE IOHEXOL 300 MG/ML  SOLN COMPARISON:  03/04/2022, 03/14/2022 FINDINGS: Lower chest: No acute pleural or parenchymal lung disease. Hepatobiliary: Stable position of a common bile duct stent, with interval resolution of the pneumobilia seen on prior study. There is mild to moderate intrahepatic biliary duct dilation. Gallbladder is minimally distended without cholelithiasis or cholecystitis. No focal liver abnormalities. Pancreas: Ill-defined calcified mass within the pancreatic head is again noted, consistent with known history of pancreatic malignancy. This measures approximately 5.1 x 4.4 cm, not appreciably changed. Marked upstream pancreatic duct dilation and pancreatic parenchymal atrophy unchanged. Spleen: Normal in size without focal abnormality. 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 Otherwise the kidneys are unremarkable. The adrenals are normal. Bladder is moderately distended without filling defect. Stomach/Bowel:  Interval placement of duodenal stent, extending from the duodenal bulb through the ligament of Treitz. No bowel obstruction or ileus. There is mild wall thickening of the ascending colon, which could reflect inflammatory or infectious colitis. 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 Stable reconstitution of the main portal vein via extensive collaterals in the upper abdomen. Stable atherosclerosis. 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 Reproductive: Uterus and bilateral adnexa are unremarkable. Other: Trace free fluid within the paracolic gutters. No free intraperitoneal gas. No abdominal wall hernia. Musculoskeletal: No acute or destructive bony lesions. Reconstructed images demonstrate no additional findings. IMPRESSION: 1. Mild wall thickening of the ascending colon consistent with inflammatory or infectious colitis. 2. Interval placement of a duodenal stent. 3. Stable common bile duct stent, with interval resolution of pneumobilia. Stable dilation of the intrahepatic biliary ducts. 4. Stable partially calcified pancreatic mass consistent with known pancreatic malignancy. Stable subcentimeter celiac lymph nodes are concerning for nodal metastases. 5. Stable 1.4 cm lesion lower pole right kidney, previously characterized as renal cell carcinoma by MRI. 6. 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 Electronically Signed   By: MRanda NgoM.D.   On: 03/27/2022 19:02    Procedures Procedures  Continuous cardiac monitoring shows normal sinus rhythm.  Medications Ordered in ED Medications  morphine (PF) 4 MG/ML injection 4 mg (4 mg Intravenous Given 03/27/22 1658)  ondansetron (ZOFRAN) injection 4 mg (4 mg Intravenous Given 03/27/22 1658)  lactated ringers bolus 1,000 mL (1,000 mLs Intravenous New Bag/Given 03/27/22 1706)  iohexol  (OMNIPAQUE) 300 MG/ML solution 100 mL (100 mLs Intravenous Contrast Given 03/27/22 1836)  morphine (PF) 4 MG/ML injection 4 mg (4 mg Intravenous Given 03/27/22 1954)    ED Course/ Medical Decision Making/ A&P Clinical Course as of 03/27/22 2123  Wed Mar 27, 2022  1650 WBC(!): 14.2 [MM]  1924 Colitis on CT. [MM]  2008 Case discussed with the hospitalist, who will evaluate the patient for admission. [MM]    Clinical Course User Index [MM] MNani Gasser MD                           Medical Decision  Making Amount and/or Complexity of Data Reviewed Labs: ordered. Decision-making details documented in ED Course.  Risk Prescription drug management. Decision regarding hospitalization.   Anish [age] year old female with history of pancreatic cancer was recently discharged from the hospital after EGD for duodenal stent and celiac plexus block.  Since discharge she has had steadily worsening abdominal pain, diarrhea and episode of nausea and vomiting.  Currently afebrile and hemodynamically stable.  Exam is notable for diffuse abdominal tenderness to light palpation.  Suspicious for intra-abdominal infection or bleeding as adverse effect of recent procedures, C. difficile colitis or some other infectious diarrhea.  Low degree of suspicion for obstruction due to large volume watery stool.   Co morbidities that complicate the patient evaluation  Pancreatic cancer   Social Determinants of Health:  Lives at home with husband.   Additional history obtained:  Additional history and/or information obtained from chart review External records from outside source obtained and reviewed including charts from prior hospital encounters.   Lab Tests:  I Ordered (or co-signed), and personally interpreted labs.  The pertinent results include: CBC, notable for leukocytosis to 14.2 CMP C. difficile screen Gastrointestinal panel Urinalysis   Imaging Studies ordered:  I ordered (or  co-signed) imaging studies including CT abdomen pelvis with contrast I independently visualized and interpreted imaging which showed no evidence of intra-abdominal abscess, perforated bowel.  Patent duodenal and common biliary ductal stents.  Ascending colonic wall thickening. I agree with the radiologist interpretation   Cardiac Monitoring:  The patient was maintained on a cardiac monitor.  The cardiac monitored showed an rhythm of normal sinus rhythm. The patient was also maintained on pulse oximetry. The readings were typically within normal limits.   Medicines ordered and prescription drug management:  I ordered medication including morphine 4 mg IV x 2, Zofran 4 mg IV Reevaluation of the patient after these medicines showed that the patient improved  Reevaluation:  After the interventions noted above, I reevaluated the patient and found that they have :improved.    Dispostion:  After consideration of the diagnostic results and the patients response to treatment, I feel that the patent would benefit from admission to the hospital for treatment of acute colitis.          Final Clinical Impression(s) / ED Diagnoses Final diagnoses:  Colitis  Diarrhea, unspecified type    Rx / DC Orders ED Discharge Orders     None         Nani Gasser, MD 03/27/22 2013    Nani Gasser, MD 03/27/22 2123    Isla Pence, MD 03/28/22 [id number]

## 2022-03-27 NOTE — Assessment & Plan Note (Signed)
   Follows with Dr. Marin Olp with oncology.  He has been added to the treatment team, his input is very much appreciated.  Overall prognosis increasingly worsening.

## 2022-03-27 NOTE — Telephone Encounter (Signed)
Pt is going back to [place] she is having bad diarrhea and was told to come. She wanted you to know.

## 2022-03-27 NOTE — Assessment & Plan Note (Signed)
.   Resume patients home regimen of oral antihypertensives . Titrate antihypertensive regimen as necessary to achieve adequate BP control . PRN intravenous antihypertensives for excessively elevated blood pressure   

## 2022-03-27 NOTE — Assessment & Plan Note (Signed)
.   Patient been placed on Accu-Cheks before every meal and nightly with sliding scale insulin . Holding home regimen of hypoglycemics considering tenuous oral intake . Hemoglobin A1C 5.7% 02/2022 . Will advance to diabetic diet once able to tolerate oral intake

## 2022-03-27 NOTE — H&P (Incomplete)
History and Physical    Patient: Melanie Alvarez MRN: [id number] DOA: 03/27/2022  Date of Service: the patient was seen and examined on 03/27/2022  Patient coming from: Home  Chief Complaint:  Chief Complaint  Patient presents with   Abdominal Pain    Post    Post-op Problem   Nausea   Emesis   Diarrhea    HPI:   66 year old female with past medical history of hepatitis C status posttreatment (with Dr. 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 emergency department with complaints of abdominal pain nausea vomiting and diarrhea.  Of note, Patient was recently hospitalized at [facility] from 7/17 until 8/6.  Patient presented with intractable nausea vomiting and abdominal pain.  EGD was performed on 7/21 visualizing previously placed Endo Clip in the gastric cardia with acquired extrinsic duodenal stenosis/obstruction from extrinsic compression with duodenal erosion without bleeding.  A duodenal stent was then placed on 7/25 with improvement in symptoms.  Due to concerns about celiac plexus invasion by patient's malignancy, celiac plexus block was performed by Dr. Rush Landmark on 8/4.  GI also felt patient was suffering from pancreatic insufficiency and placed patient on Creon  with discharge on 8/6.    Patient is now complaining of a recurrence of her abdominal pain nausea vomiting and diarrhea.  Patient describes the abdominal pain as generalized and severe, worse with movement and improved with rest.  Patient is reporting that her diarrhea is watery and nonbloody occurring at least 5 times daily.  Patient is  suffering from persistent nausea leading to poor oral intake with occasional bouts of nonbilious nonbloody vomiting.  Patient denies fevers, sick contacts, recent ingestion of undercooked food, recent travel.  Due to patient's worsening symptoms patient eventually presented to [facility] emergency department for evaluation.  Upon evaluation in the emergency department initial workup revealed a diffusely tender abdomen.  CT imaging of the abdomen and pelvis was performed revealing mild wall thickening of the ascending colon concerning for inflammatory or infectious colitis.  Patient was given several doses of morphine as well as intravenous lactated Ringer's solution and the hospitalist group was then called to assess the patient for admission to the hospital.    Review of Systems: Review of Systems  Constitutional:  Positive for malaise/fatigue.  Gastrointestinal:  Positive for abdominal pain, diarrhea, nausea and vomiting.  Neurological:  Positive for weakness.  All other systems reviewed and are negative.    Past Medical History:  Diagnosis Date   Cancer ([place])    Cirrhosis ([place])    Diabetes mellitus 2012   Diverticulosis    Gallbladder sludge    Hepatitis 2010   history of Hepatitis C   Hepatitis C    Hypertension 2011   Obesity     Past Surgical History:  Procedure Laterality Date   BILIARY BRUSHING  09/22/2019   Procedure: BILIARY BRUSHING;  Surgeon: Irving Copas., MD;  Location: [facility];  Service: Gastroenterology;;   BILIARY DILATION  09/22/2019   Procedure: BILIARY DILATION;  Surgeon: Irving Copas., MD;  Location: [facility];  Service: Gastroenterology;;   BILIARY STENT PLACEMENT N/A 09/22/2019   Procedure: BILIARY STENT PLACEMENT;  Surgeon: Irving Copas., MD;  Location: [facility];  Service: Gastroenterology;  Laterality: N/A;   COLONOSCOPY  07/24/2011   Procedure: COLONOSCOPY;  Surgeon: Landry Dyke, MD;  Location: [facility];  Service: Endoscopy;  Laterality: N/A;   Dental Surgery Tooth Extraction     DUODENAL STENT PLACEMENT N/A 03/12/2022   Procedure: DUODENAL STENT PLACEMENT;  Surgeon: Rush Landmark Telford Nab., MD;  Location: [facility];  Service: Gastroenterology;  Laterality: N/A;   ERCP N/A 09/22/2019   Procedure: ENDOSCOPIC RETROGRADE CHOLANGIOPANCREATOGRAPHY (ERCP);  Surgeon: Irving Copas., MD;  Location: [facility];  Service: Gastroenterology;  Laterality: N/A;   ESOPHAGOGASTRODUODENOSCOPY (EGD) WITH PROPOFOL N/A 09/22/2019   Procedure: ESOPHAGOGASTRODUODENOSCOPY (EGD) WITH PROPOFOL;  Surgeon: Rush Landmark Telford Nab., MD;  Location: [facility];  Service: Gastroenterology;  Laterality: N/A;   ESOPHAGOGASTRODUODENOSCOPY (EGD) WITH PROPOFOL N/A 09/25/2019   Procedure: ESOPHAGOGASTRODUODENOSCOPY (EGD) WITH PROPOFOL;  Surgeon: Rush Landmark Telford Nab., MD;  Location: [facility];  Service: Gastroenterology;  Laterality: N/A;   ESOPHAGOGASTRODUODENOSCOPY (EGD) WITH PROPOFOL N/A 01/19/2020   Procedure: ESOPHAGOGASTRODUODENOSCOPY (EGD) WITH PROPOFOL;  Surgeon: Rush Landmark Telford Nab., MD;  Location: [place];  Service: Gastroenterology;  Laterality: N/A;   ESOPHAGOGASTRODUODENOSCOPY (EGD) WITH PROPOFOL N/A 03/08/2022   Procedure: ESOPHAGOGASTRODUODENOSCOPY (EGD) WITH PROPOFOL;  Surgeon: Ladene Artist, MD;  Location: [facility];  Service: Gastroenterology;  Laterality: N/A;   ESOPHAGOGASTRODUODENOSCOPY (EGD) WITH PROPOFOL N/A 03/12/2022   Procedure: ESOPHAGOGASTRODUODENOSCOPY (EGD) WITH PROPOFOL;  Surgeon: Rush Landmark Telford Nab., MD;  Location: [facility];  Service: Gastroenterology;  Laterality: N/A;  placement of duodenal stent   ESOPHAGOGASTRODUODENOSCOPY (EGD) WITH PROPOFOL N/A 03/22/2022   Procedure: ESOPHAGOGASTRODUODENOSCOPY (EGD) WITH PROPOFOL;  Surgeon: Rush Landmark Telford Nab., MD;  Location: [facility];  Service: Gastroenterology;  Laterality: N/A;   EUS N/A 09/22/2019   Procedure:  UPPER ENDOSCOPIC ULTRASOUND (EUS) RADIAL;  Surgeon: Irving Copas., MD;  Location: [facility];  Service: Gastroenterology;  Laterality: N/A;   EUS N/A 01/19/2020   Procedure: UPPER ENDOSCOPIC ULTRASOUND (EUS) RADIAL;  Surgeon: Irving Copas., MD;  Location: [place];  Service: Gastroenterology;  Laterality: N/A;   FIDUCIAL MARKER PLACEMENT N/A 01/19/2020   Procedure: FIDUCIAL MARKER PLACEMENT;  Surgeon: Irving Copas., MD;  Location: [place];  Service: Gastroenterology;  Laterality: N/A;   FINE NEEDLE ASPIRATION  09/22/2019   Procedure: FINE NEEDLE ASPIRATION (FNA) LINEAR;  Surgeon: Irving Copas., MD;  Location: [facility];  Service: Gastroenterology;;   FINE NEEDLE ASPIRATION  09/25/2019   Procedure: FINE NEEDLE ASPIRATION (FNA) RADIAL;  Surgeon: Irving Copas., MD;  Location: [facility];  Service: Gastroenterology;;   HEMOSTASIS CLIP PLACEMENT  09/25/2019   Procedure: HEMOSTASIS CLIP PLACEMENT;  Surgeon: Irving Copas., MD;  Location: [facility];  Service: Gastroenterology;;   HOT HEMOSTASIS N/A 03/22/2022   Procedure: HOT HEMOSTASIS (ARGON PLASMA COAGULATION/BICAP);  Surgeon: Irving Copas., MD;  Location: [facility];  Service: Gastroenterology;  Laterality: N/A;   IR CATHETER TUBE CHANGE  01/31/2022   IR CHOLANGIOGRAM EXISTING TUBE  02/11/2022   IR IMAGING GUIDED PORT INSERTION  09/24/2019   IR IMAGING GUIDED PORT INSERTION  12/31/2021   IR RADIOLOGIST EVAL & MGMT  01/30/2022   IR RADIOLOGIST EVAL & MGMT  02/14/2022   IR REMOVAL TUN ACCESS W/ PORT W/O FL MOD SED  06/16/2020   NEUROLYTIC CELIAC PLEXUS  03/22/2022   Procedure: NEUROLYTIC CELIAC PLEXUS;  Surgeon: Irving Copas., MD;  Location: [facility];  Service: Gastroenterology;;   Joan Mayans  09/22/2019   Procedure: Joan Mayans;  Surgeon: Irving Copas., MD;  Location:  [facility];  Service: Gastroenterology;;   UPPER ESOPHAGEAL ENDOSCOPIC ULTRASOUND  (EUS) N/A 09/25/2019   Procedure: UPPER ESOPHAGEAL ENDOSCOPIC ULTRASOUND (EUS);  Surgeon: Irving Copas., MD;  Location: [facility];  Service: Gastroenterology;  Laterality: N/A;   UPPER ESOPHAGEAL ENDOSCOPIC ULTRASOUND (EUS) N/A 03/22/2022   Procedure: UPPER ESOPHAGEAL ENDOSCOPIC ULTRASOUND (EUS);  Surgeon: Irving Copas., MD;  Location: [facility];  Service: Gastroenterology;  Laterality: N/A;  Linear Scope    Social History:  reports that she quit smoking about 2 years ago. Her smoking use included cigarettes. She smoked an average of .25 packs per day. She has never used smokeless tobacco. She reports that she does not currently use alcohol. She reports that she does not use drugs.  Allergies  Allergen Reactions   Lisinopril Swelling and Other (See Comments)    Facial and upper lip    Family History  Problem Relation Age of Onset   Diabetes Sister    Cancer Maternal Grandmother        leukemia    Diabetes Maternal Grandfather    Diabetes Paternal Grandfather    Diabetes Paternal Grandmother    Hypertension Sister    Hypertension Brother    Anesthesia problems Neg Hx     Prior to Admission medications   Medication Sig Start Date End Date Taking? Authorizing Provider  ALPRAZolam Duanne Moron) 1 MG tablet Take 0.5 tablets (0.5 mg total) by mouth every 8 (eight) hours as needed for anxiety. 02/11/22  Yes Volanda Napoleon, MD  amLODipine (NORVASC) 10 MG tablet Take 1 tablet (10 mg total) by mouth at bedtime. 11/27/21  Yes Nche, Charlene Brooke, NP  apixaban (ELIQUIS) 5 MG TABS tablet Take 1 tablet (5 mg total) by mouth 2 (two) times daily. 03/24/22  Yes Bonnielee Haff, MD  atorvastatin (LIPITOR) 20 MG tablet Take 1 tablet (20 mg total) by mouth at bedtime. 01/25/21  Yes Nche, Charlene Brooke, NP  CREON 36000-114000 units CPEP capsule TAKE 2 CAPSULES BY MOUTH THREE TIMES DAILY BEFORE MEAL(S) Patient taking differently: Take 72,000 Units by mouth 3 (three) times daily before meals.  02/01/22  Yes Volanda Napoleon, MD  diphenoxylate-atropine (LOMOTIL) 2.5-0.025 MG tablet Take 1 tablet by mouth 4 (four) times daily as needed for diarrhea or loose stools. 03/27/22  Yes Volanda Napoleon, MD  fentaNYL (DURAGESIC) 25 MCG/HR Place 2 patches onto the skin every 3 (three) days. Patient taking differently: Place 1-2 patches onto the skin every 3 (three) days. 03/24/22  Yes Bonnielee Haff, MD  gabapentin (NEURONTIN) 400 MG capsule Take 1 capsule (400 mg total) by mouth 3 (three) times daily. 03/24/22  Yes Bonnielee Haff, MD  glipiZIDE (GLUCOTROL XL) 5 MG 24 hr tablet Take 1 tablet (5 mg total) by mouth daily with breakfast. 09/14/21  Yes Nche, Charlene Brooke, NP  HYDROmorphone (DILAUDID) 4 MG tablet Take 1 tablet (4 mg total) by mouth every 4 (four) hours as needed for severe pain. 03/04/22  Yes Ennever, Rudell Cobb, MD  loperamide (IMODIUM) 2 MG capsule Take 2 capsules (4 mg total) by mouth 3 (three) times daily as needed for diarrhea or loose stools. 03/24/22  Yes Bonnielee Haff, MD  meclizine (ANTIVERT) 25 MG tablet Take 25 mg by mouth 3 (three) times daily as needed for dizziness. 03/25/22  Yes [provider]  ondansetron (ZOFRAN) 8 MG tablet Take 1 tablet (8 mg total) by mouth 2 (two) times daily as needed (Nausea or vomiting). 03/04/22  Yes Vicie Mutters R, PA-C  sodium  chloride 1 g tablet Take 1 tablet (1 g total) by mouth 2 (two) times daily with a meal. 03/24/22  Yes Bonnielee Haff, MD  metFORMIN (GLUCOPHAGE-XR) 750 MG 24 hr tablet Take 1 tablet by mouth once daily with breakfast 03/26/22   Nche, Charlene Brooke, NP  pantoprazole (PROTONIX) 40 MG tablet Take 1 tablet (40 mg total) by mouth daily. 12/24/21   Volanda Napoleon, MD  sucralfate (CARAFATE) 1 g tablet Take 1 tablet (1 g total) by mouth 2 (two) times daily. 03/04/22   Vladimir Crofts, PA-C    Physical Exam:  Vitals:   03/27/22 1930 03/27/22 1945 03/27/22 1953 03/27/22 2100  BP:   (!) 146/99   Pulse: (!) 106 99 97   Resp: 20   16   Temp:    97.8 F (36.6 C)  TempSrc:    Oral  SpO2: 98% 99% 100%   Weight:      Height:        *** Constitutional: Awake alert and oriented x3, no associated distress.   Skin: no rashes, no lesions, good skin turgor noted. Eyes: Pupils are equally reactive to light.  No evidence of scleral icterus or conjunctival pallor.  ENMT: Moist mucous membranes noted.  Posterior pharynx clear of any exudate or lesions.   Neck: normal, supple, no masses, no thyromegaly.  No evidence of jugular venous distension.   Respiratory: clear to auscultation bilaterally, no wheezing, no crackles. Normal respiratory effort. No accessory muscle use.  Cardiovascular: Regular rate and rhythm, no murmurs / rubs / gallops. No extremity edema. 2+ pedal pulses. No carotid bruits.  Chest:   Nontender without crepitus or deformity.   Back:   Nontender without crepitus or deformity. Abdomen: Abdomen is soft and nontender.  No evidence of intra-abdominal masses.  Positive bowel sounds noted in all quadrants.   Musculoskeletal: No joint deformity upper and lower extremities. Good ROM, no contractures. Normal muscle tone.  Neurologic: CN 2-12 grossly intact. Sensation intact.  Patient moving all 4 extremities spontaneously.  Patient is following all commands.  Patient is responsive to verbal stimuli.   Psychiatric: Patient exhibits normal mood with appropriate affect.  Patient seems to possess insight as to their current situation.    Data Reviewed:  I have personally reviewed and interpreted labs, imaging.  Significant findings are:  Chemistry revealing sodium 128, potassium 3.7, chloride 98, bicarbonate 23, BUN 12, creatinine 0.73 CBC revealing white blood cell count of 14.2, hemoglobin 9.4, hematocrit 29.4, platelet count of 293 Urinalysis revealing a low specific gravity 1.003 otherwise unremarkable  EKG: Personally reviewed.  Rhythm is *** with heart rate of ***.  No dynamic ST segment changes  appreciated.   *** Assessment and Plan: Mesenteric vein thrombosis (HCC) Continue previously prescribed regimen of Eliquis  Portal vein thrombosis Continue previously prescribed regimen of Eliquis  Malignant neoplasm of pancreas ([place]) Follows with Dr. Marin Olp with oncology.  He has been added to the treatment team, his input is very much appreciated. Overall prognosis increasingly worsening.  Type 2 diabetes mellitus with hyperglycemia, without long-term current use of insulin (HCC) Patient been placed on Accu-Cheks before every meal and nightly with sliding scale insulin Holding home regimen of hypoglycemics considering tenuous oral intake Hemoglobin A1C 5.7% 02/2022 Will advance to diabetic diet once able to tolerate oral intake   Hyponatremia Modest hyponatremia that seems to have worsened somewhat compared to her usual baseline Likely multifactorial  Known history of SIADH ED provider has already provided a 1  L bolus of lactated Ringer solution. Will obtain a repeat chemistry before deciding whether further fluids are indicated Will continue home regimen of oral salt tablets twice daily  Essential hypertension Resume patients home regimen of oral antihypertensives Titrate antihypertensive regimen as necessary to achieve adequate BP control PRN intravenous antihypertensives for excessively elevated blood pressure    Cirrhosis of liver (HCC) Longstanding known history of cirrhosis -history of hepatitis C status posttreatment as well as history of alcohol use Obtaining coagulation profile No evidence of thrombocytopenia Outpatient follow-up with gastroenterology  Pancreatic insufficiency Recently placed on Creon therapy at the recommendation of gastroenterology which will be continued at this time  Mixed hyperlipidemia Continuing home regimen of lipid lowering therapy.   Anemia of chronic disease No clinical evidence of bleeding Unremarkable vitamin B12, iron  panel, folate via workup in July Monitor hemoglobin and hematocrit with serial CBCs       Code Status:  {Palliative Code status:23503}  code status decision has been confirmed with: *** Family Communication: ***   Consults: ***  Severity of Illness:  {Observation/Inpatient:21159}  Author:  Vernelle Emerald MD  03/27/2022 10:40 PM

## 2022-03-27 NOTE — ED Provider Triage Note (Signed)
Emergency Medicine Provider Triage Evaluation Note  Melanie Alvarez , a 66 y.o. female  was evaluated in triage.  Pt complains of abdominal pain, nausea, vomiting, diarrhea.  Patient states that symptoms gotten progressively worse prompting discharge from her prior hospital visit spanned the course of 3 weeks.  She was told to come back to the emergency department if diarrhea persisted or if symptoms got worse.  She denies hematemesis, hematochezia, melena, fever, chest pain, shortness of breath.  Abdominal pain is described as diffuse in nature.  Review of Systems  Positive: See above Negative:   Physical Exam  BP (!) 152/89   Pulse 98   Temp 98.7 F (37.1 C)   Resp 18   Ht 5' 3.5" (1.613 m)   Wt 66.7 kg   SpO2 100%   BMI 25.63 kg/m  Gen:   Awake, no distress   Resp:  Normal effort  MSK:   Moves extremities without difficulty  Other:  Diffuse tenderness to palpation of abdomen.  Medical Decision Making  Medically screening exam initiated at 3:58 PM.  Appropriate orders placed.  Maile L Woodroof was informed that the remainder of the evaluation will be completed by another provider, this initial triage assessment does not replace that evaluation, and the importance of remaining in the ED until their evaluation is complete.     Wilnette Kales, [place] 03/27/22 1600

## 2022-03-27 NOTE — Assessment & Plan Note (Signed)
   Modest hyponatremia that seems to have worsened somewhat compared to her usual baseline  Likely multifactorial compounded by excessive volume depletion in the setting of SIADH  Known history of SIADH  ED provider has already provided a at least a 1 L bolus of lactated Ringer solution.  Will obtain a repeat chemistry before deciding whether further fluids are indicated  Will continue home regimen of oral salt tablets twice daily for now although this may need to be discontinued based on serial sodium levels

## 2022-03-27 NOTE — Telephone Encounter (Signed)
Received VM from pt reporting that diarrhea has improved "just a little bit" but she is continuing to have diarrhea after every episode of eating despite taking imodium as prescribed. Per Dr Marin Olp, prescription for Lomotil will be called into [facility]. This information left on pt's personalized VM. dph

## 2022-03-27 NOTE — Assessment & Plan Note (Signed)
   Longstanding known history of cirrhosis -history of hepatitis C status posttreatment as well as history of alcohol use  Obtaining coagulation profile  No evidence of thrombocytopenia  Outpatient follow-up with gastroenterology

## 2022-03-28 ENCOUNTER — Encounter (HOSPITAL_COMMUNITY): Payer: Self-pay | Admitting: Internal Medicine

## 2022-03-28 DIAGNOSIS — Z7984 Long term (current) use of oral hypoglycemic drugs: Secondary | ICD-10-CM | POA: Diagnosis not present

## 2022-03-28 DIAGNOSIS — E876 Hypokalemia: Secondary | ICD-10-CM | POA: Diagnosis present

## 2022-03-28 DIAGNOSIS — E782 Mixed hyperlipidemia: Secondary | ICD-10-CM | POA: Diagnosis present

## 2022-03-28 DIAGNOSIS — K55059 Acute (reversible) ischemia of intestine, part and extent unspecified: Secondary | ICD-10-CM | POA: Diagnosis present

## 2022-03-28 DIAGNOSIS — K529 Noninfective gastroenteritis and colitis, unspecified: Secondary | ICD-10-CM | POA: Diagnosis present

## 2022-03-28 DIAGNOSIS — A0811 Acute gastroenteropathy due to Norwalk agent: Secondary | ICD-10-CM | POA: Diagnosis present

## 2022-03-28 DIAGNOSIS — E222 Syndrome of inappropriate secretion of antidiuretic hormone: Secondary | ICD-10-CM | POA: Diagnosis present

## 2022-03-28 DIAGNOSIS — E860 Dehydration: Secondary | ICD-10-CM | POA: Diagnosis present

## 2022-03-28 DIAGNOSIS — Z7901 Long term (current) use of anticoagulants: Secondary | ICD-10-CM | POA: Diagnosis not present

## 2022-03-28 DIAGNOSIS — I81 Portal vein thrombosis: Secondary | ICD-10-CM | POA: Diagnosis present

## 2022-03-28 DIAGNOSIS — Z794 Long term (current) use of insulin: Secondary | ICD-10-CM | POA: Diagnosis not present

## 2022-03-28 DIAGNOSIS — E1165 Type 2 diabetes mellitus with hyperglycemia: Secondary | ICD-10-CM | POA: Diagnosis present

## 2022-03-28 DIAGNOSIS — Z79899 Other long term (current) drug therapy: Secondary | ICD-10-CM | POA: Diagnosis not present

## 2022-03-28 DIAGNOSIS — Z806 Family history of leukemia: Secondary | ICD-10-CM | POA: Diagnosis not present

## 2022-03-28 DIAGNOSIS — Z8249 Family history of ischemic heart disease and other diseases of the circulatory system: Secondary | ICD-10-CM | POA: Diagnosis not present

## 2022-03-28 DIAGNOSIS — Z87891 Personal history of nicotine dependence: Secondary | ICD-10-CM | POA: Diagnosis not present

## 2022-03-28 DIAGNOSIS — Z8619 Personal history of other infectious and parasitic diseases: Secondary | ICD-10-CM | POA: Diagnosis not present

## 2022-03-28 DIAGNOSIS — A0472 Enterocolitis due to Clostridium difficile, not specified as recurrent: Secondary | ICD-10-CM | POA: Diagnosis present

## 2022-03-28 DIAGNOSIS — D638 Anemia in other chronic diseases classified elsewhere: Secondary | ICD-10-CM | POA: Diagnosis present

## 2022-03-28 DIAGNOSIS — R1084 Generalized abdominal pain: Secondary | ICD-10-CM | POA: Diagnosis not present

## 2022-03-28 DIAGNOSIS — R197 Diarrhea, unspecified: Secondary | ICD-10-CM | POA: Diagnosis present

## 2022-03-28 DIAGNOSIS — Z833 Family history of diabetes mellitus: Secondary | ICD-10-CM | POA: Diagnosis not present

## 2022-03-28 DIAGNOSIS — B370 Candidal stomatitis: Secondary | ICD-10-CM | POA: Diagnosis not present

## 2022-03-28 DIAGNOSIS — K7460 Unspecified cirrhosis of liver: Secondary | ICD-10-CM | POA: Diagnosis present

## 2022-03-28 DIAGNOSIS — Z888 Allergy status to other drugs, medicaments and biological substances status: Secondary | ICD-10-CM | POA: Diagnosis not present

## 2022-03-28 DIAGNOSIS — C259 Malignant neoplasm of pancreas, unspecified: Secondary | ICD-10-CM | POA: Diagnosis present

## 2022-03-28 DIAGNOSIS — I10 Essential (primary) hypertension: Secondary | ICD-10-CM | POA: Diagnosis present

## 2022-03-28 LAB — C-REACTIVE PROTEIN: CRP: 2.9 mg/dL — ABNORMAL HIGH (ref <1.0)

## 2022-03-28 LAB — CBC WITH DIFFERENTIAL/PLATELET
Abs Immature Granulocytes: 0.04 10*3/uL (ref 0.00–0.07)
Basophils Absolute: 0 10*3/uL (ref 0.0–0.1)
Basophils Relative: 0 %
Eosinophils Absolute: 0.1 10*3/uL (ref 0.0–0.5)
Eosinophils Relative: 1 %
HCT: 28.8 % — ABNORMAL LOW (ref 36.0–46.0)
Hemoglobin: 9.3 g/dL — ABNORMAL LOW (ref 12.0–15.0)
Immature Granulocytes: 0 %
Lymphocytes Relative: 14 %
Lymphs Abs: 1.3 10*3/uL (ref 0.7–4.0)
MCH: 28.2 pg (ref 26.0–34.0)
MCHC: 32.3 g/dL (ref 30.0–36.0)
MCV: 87.3 fL (ref 80.0–100.0)
Monocytes Absolute: 0.5 10*3/uL (ref 0.1–1.0)
Monocytes Relative: 5 %
Neutro Abs: 7.5 10*3/uL (ref 1.7–7.7)
Neutrophils Relative %: 80 %
Platelets: 280 10*3/uL (ref 150–400)
RBC: 3.3 MIL/uL — ABNORMAL LOW (ref 3.87–5.11)
RDW: 15.3 % (ref 11.5–15.5)
WBC: 9.4 10*3/uL (ref 4.0–10.5)
nRBC: 0 % (ref 0.0–0.2)

## 2022-03-28 LAB — C DIFFICILE QUICK SCREEN W PCR REFLEX
C Diff antigen: POSITIVE — AB
C Diff interpretation: DETECTED
C Diff toxin: POSITIVE — AB

## 2022-03-28 LAB — COMPREHENSIVE METABOLIC PANEL
ALT: 23 U/L (ref 0–44)
AST: 19 U/L (ref 15–41)
Albumin: 2.6 g/dL — ABNORMAL LOW (ref 3.5–5.0)
Alkaline Phosphatase: 68 U/L (ref 38–126)
Anion gap: 8 (ref 5–15)
BUN: 7 mg/dL — ABNORMAL LOW (ref 8–23)
CO2: 25 mmol/L (ref 22–32)
Calcium: 8.3 mg/dL — ABNORMAL LOW (ref 8.9–10.3)
Chloride: 102 mmol/L (ref 98–111)
Creatinine, Ser: 0.61 mg/dL (ref 0.44–1.00)
GFR, Estimated: >60 mL/min (ref >60)
Glucose, Bld: 150 mg/dL — ABNORMAL HIGH (ref 70–99)
Potassium: 3.5 mmol/L (ref 3.5–5.1)
Sodium: 135 mmol/L (ref 135–145)
Total Bilirubin: 0.6 mg/dL (ref 0.3–1.2)
Total Protein: 6.4 g/dL — ABNORMAL LOW (ref 6.5–8.1)

## 2022-03-28 LAB — BASIC METABOLIC PANEL
Anion gap: 7 (ref 5–15)
BUN: 8 mg/dL (ref 8–23)
CO2: 25 mmol/L (ref 22–32)
Calcium: 8.4 mg/dL — ABNORMAL LOW (ref 8.9–10.3)
Chloride: 102 mmol/L (ref 98–111)
Creatinine, Ser: 0.61 mg/dL (ref 0.44–1.00)
GFR, Estimated: >60 mL/min (ref >60)
Glucose, Bld: 152 mg/dL — ABNORMAL HIGH (ref 70–99)
Potassium: 3.4 mmol/L — ABNORMAL LOW (ref 3.5–5.1)
Sodium: 134 mmol/L — ABNORMAL LOW (ref 135–145)

## 2022-03-28 LAB — GLUCOSE, CAPILLARY
Glucose-Capillary: 136 mg/dL — ABNORMAL HIGH (ref 70–99)
Glucose-Capillary: 165 mg/dL — ABNORMAL HIGH (ref 70–99)
Glucose-Capillary: 135 mg/dL — ABNORMAL HIGH (ref 70–99)
Glucose-Capillary: 62 mg/dL — ABNORMAL LOW (ref 70–99)

## 2022-03-28 LAB — LIPASE, BLOOD: Lipase: 29 U/L (ref 11–51)

## 2022-03-28 LAB — PROTIME-INR
INR: 1.4 — ABNORMAL HIGH (ref 0.8–1.2)
Prothrombin Time: 17.1 seconds — ABNORMAL HIGH (ref 11.4–15.2)

## 2022-03-28 LAB — MAGNESIUM: Magnesium: 1.7 mg/dL (ref 1.7–2.4)

## 2022-03-28 LAB — PROCALCITONIN: Procalcitonin: <0.1 ng/mL

## 2022-03-28 LAB — APTT: aPTT: 27 seconds (ref 24–36)

## 2022-03-28 MED ORDER — DEXTROSE 50 % IV SOLN
1.0000 | Freq: Once | INTRAVENOUS | Status: AC
  Administered 2022-03-28: 50 mL via INTRAVENOUS

## 2022-03-28 MED ORDER — DEXTROSE 50 % IV SOLN
INTRAVENOUS | Status: AC
  Filled 2022-03-28: qty 50

## 2022-03-28 MED ORDER — SODIUM CHLORIDE 0.9 % IV SOLN
INTRAVENOUS | Status: DC
Start: 2022-03-28 — End: 2022-03-28

## 2022-03-28 MED ORDER — VANCOMYCIN HCL 125 MG PO CAPS
125.0000 mg | ORAL_CAPSULE | Freq: Four times a day (QID) | ORAL | Status: DC
  Administered 2022-03-28 – 2022-04-05 (×31): 125 mg via ORAL
  Filled 2022-03-28 (×33): qty 1

## 2022-03-28 MED ORDER — METRONIDAZOLE 500 MG/100ML IV SOLN
500.0000 mg | Freq: Three times a day (TID) | INTRAVENOUS | Status: DC
  Administered 2022-03-28 – 2022-04-01 (×12): 500 mg via INTRAVENOUS
  Filled 2022-03-28 (×12): qty 100

## 2022-03-28 MED ORDER — METRONIDAZOLE 500 MG/100ML IV SOLN
500.0000 mg | Freq: Two times a day (BID) | INTRAVENOUS | Status: DC
Start: 2022-03-28 — End: 2022-03-28
  Administered 2022-03-28: 500 mg via INTRAVENOUS
  Filled 2022-03-28: qty 100

## 2022-03-28 MED ORDER — POTASSIUM CHLORIDE CRYS ER 20 MEQ PO TBCR
40.0000 meq | EXTENDED_RELEASE_TABLET | Freq: Once | ORAL | Status: AC
  Administered 2022-03-28: 40 meq via ORAL
  Filled 2022-03-28: qty 2

## 2022-03-28 MED ORDER — MAGNESIUM SULFATE 2 GM/50ML IV SOLN
2.0000 g | Freq: Once | INTRAVENOUS | Status: AC
Start: 2022-03-28 — End: 2022-03-28
  Administered 2022-03-28: 2 g via INTRAVENOUS
  Filled 2022-03-28: qty 50

## 2022-03-28 NOTE — ED Notes (Signed)
Patient ambulated to the restroom independently. Was unable to provide a stool sample at this time.

## 2022-03-28 NOTE — Consult Note (Addendum)
[place]  [phone] Fax:([phone]) [place]  Reason for Consultation: Pancreatic cancer  HPI: Ms. 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  She presented to the emergency department with abdominal pain, nausea, vomiting, diarrhea.  She had a recent hospital admission from 7/17 through 8/6 for similar symptoms.  She underwent endoscopy during that hospitalization and a duodenal stent was placed on 7/25 with improvement of her symptoms.  Due to concern about celiac plexus invasion by the patient's cancer, celiac plexus block was performed on 8/4.  She was also started on Creon during that hospitalization due to concern for pancreatic insufficiency.  Upon admission to the hospital last evening, her abdomen was found to be diffusely tender.  CT of the abdomen/pelvis was performed which revealed mild wall thickening of the ascending colon concerning for inflammatory or infectious colitis.  She has been receiving IV pain medication and was started on a clear liquid diet.  Stool for C. difficile and GI panel have been ordered but not yet collected.  This morning, the patient reports ongoing abdominal pain.  Rates pain 8/10.  She is not currently having any nausea or vomiting.  She states that she had about 5-6 loose stools yesterday but none so far since she has been at the hospital.  She is able to take in some clear liquids this morning.  She is not having any fevers or chills.  She is not having any headaches, dizziness, chest pain, shortness of breath.  Medical oncology was asked to see the patient make recommendations regarding her pancreatic  cancer.   Past Medical History:  Diagnosis Date   Cancer ([place])    Cirrhosis ([place])    Diabetes mellitus 2012   Diverticulosis    Gallbladder sludge    Hepatitis 2010   history of Hepatitis C   Hepatitis C    Hypertension 2011   Obesity   :   Past Surgical History:  Procedure Laterality Date   BILIARY BRUSHING  09/22/2019   Procedure: BILIARY BRUSHING;  Surgeon: Irving Copas., MD;  Location: [facility];  Service: Gastroenterology;;   BILIARY DILATION  09/22/2019   Procedure: BILIARY DILATION;  Surgeon: Irving Copas., MD;  Location: [facility];  Service: Gastroenterology;;   BILIARY STENT PLACEMENT N/A 09/22/2019   Procedure: BILIARY STENT PLACEMENT;  Surgeon: Irving Copas., MD;  Location: [facility];  Service: Gastroenterology;  Laterality: N/A;   COLONOSCOPY  07/24/2011   Procedure: COLONOSCOPY;  Surgeon: Landry Dyke, MD;  Location: [facility];  Service: Endoscopy;  Laterality: N/A;   Dental Surgery Tooth Extraction     DUODENAL STENT PLACEMENT N/A 03/12/2022   Procedure: DUODENAL STENT PLACEMENT;  Surgeon: Rush Landmark Telford Nab., MD;  Location: [facility];  Service: Gastroenterology;  Laterality: N/A;   ERCP N/A 09/22/2019   Procedure: ENDOSCOPIC RETROGRADE CHOLANGIOPANCREATOGRAPHY (ERCP);  Surgeon: Irving Copas., MD;  Location: [facility];  Service: Gastroenterology;  Laterality: N/A;   ESOPHAGOGASTRODUODENOSCOPY (EGD) WITH PROPOFOL N/A 09/22/2019   Procedure: ESOPHAGOGASTRODUODENOSCOPY (EGD) WITH PROPOFOL;  Surgeon: Rush Landmark Telford Nab., MD;  [facility];  Service: Gastroenterology;  Laterality: N/A;   ESOPHAGOGASTRODUODENOSCOPY (EGD) WITH PROPOFOL N/A 09/25/2019   Procedure: ESOPHAGOGASTRODUODENOSCOPY (EGD) WITH PROPOFOL;  Surgeon: Irving Copas., MD;  [facility];  Service: Gastroenterology;  Laterality: N/A;   ESOPHAGOGASTRODUODENOSCOPY (EGD) WITH PROPOFOL N/A 01/19/2020   Procedure:  ESOPHAGOGASTRODUODENOSCOPY (EGD) WITH PROPOFOL;  Surgeon: Rush Landmark Telford Nab., MD;  Location: [place];  Service: Gastroenterology;  Laterality: N/A;   ESOPHAGOGASTRODUODENOSCOPY (EGD) WITH PROPOFOL N/A 03/08/2022   Procedure: ESOPHAGOGASTRODUODENOSCOPY (EGD) WITH PROPOFOL;  Surgeon: Ladene Artist, MD;  [facility];  Service: Gastroenterology;  Laterality: N/A;   ESOPHAGOGASTRODUODENOSCOPY (EGD) WITH PROPOFOL N/A 03/12/2022   Procedure: ESOPHAGOGASTRODUODENOSCOPY (EGD) WITH PROPOFOL;  Surgeon: Rush Landmark Telford Nab., MD;  [facility];  Service: Gastroenterology;  Laterality: N/A;  placement of duodenal stent   ESOPHAGOGASTRODUODENOSCOPY (EGD) WITH PROPOFOL N/A 03/22/2022   Procedure: ESOPHAGOGASTRODUODENOSCOPY (EGD) WITH PROPOFOL;  Surgeon: Rush Landmark Telford Nab., MD;  [facility];  Service: Gastroenterology;  Laterality: N/A;   EUS N/A 09/22/2019   Procedure: UPPER ENDOSCOPIC ULTRASOUND (EUS) RADIAL;  Surgeon: Irving Copas., MD;  Location: [facility];  Service: Gastroenterology;  Laterality: N/A;   EUS N/A 01/19/2020   Procedure: UPPER ENDOSCOPIC ULTRASOUND (EUS) RADIAL;  Surgeon: Irving Copas., MD;  Location: [place];  Service: Gastroenterology;  Laterality: N/A;   FIDUCIAL MARKER PLACEMENT N/A 01/19/2020   Procedure: FIDUCIAL MARKER PLACEMENT;  Surgeon: Irving Copas., MD;  Location: [place];  Service: Gastroenterology;  Laterality: N/A;   FINE NEEDLE ASPIRATION  09/22/2019   Procedure: FINE NEEDLE ASPIRATION (FNA) LINEAR;  Surgeon: Irving Copas., MD;  Location: [facility];  Service: Gastroenterology;;   FINE NEEDLE ASPIRATION  09/25/2019   Procedure: FINE NEEDLE ASPIRATION (FNA) RADIAL;  Surgeon: Irving Copas., MD;  Location: [facility];  Service: Gastroenterology;;   HEMOSTASIS CLIP PLACEMENT  09/25/2019   Procedure: HEMOSTASIS CLIP PLACEMENT;  Surgeon: Irving Copas., MD;  Location: [facility];  Service: Gastroenterology;;   HOT HEMOSTASIS N/A 03/22/2022   Procedure: HOT HEMOSTASIS (ARGON PLASMA COAGULATION/BICAP);  Surgeon: Irving Copas., MD;  Location: [facility];  Service: Gastroenterology;  Laterality: N/A;   IR CATHETER TUBE CHANGE  01/31/2022   IR CHOLANGIOGRAM EXISTING TUBE  02/11/2022   IR IMAGING GUIDED PORT INSERTION  09/24/2019   IR IMAGING GUIDED PORT INSERTION  12/31/2021   IR RADIOLOGIST EVAL & MGMT  01/30/2022   IR RADIOLOGIST EVAL & MGMT  02/14/2022   IR REMOVAL TUN ACCESS W/ PORT W/O FL MOD SED  06/16/2020   NEUROLYTIC CELIAC PLEXUS  03/22/2022   Procedure: NEUROLYTIC CELIAC PLEXUS;  Surgeon: Irving Copas., MD;  Location: [facility];  Service: Gastroenterology;;   Joan Mayans  09/22/2019   Procedure: Joan Mayans;  Surgeon: Irving Copas., MD;  Location: [facility];  Service: Gastroenterology;;   UPPER ESOPHAGEAL ENDOSCOPIC ULTRASOUND (EUS) N/A 09/25/2019   Procedure: UPPER ESOPHAGEAL ENDOSCOPIC ULTRASOUND (EUS);  Surgeon: Irving Copas., MD;  Location: [facility];  Service: Gastroenterology;  Laterality: N/A;   UPPER ESOPHAGEAL ENDOSCOPIC ULTRASOUND (EUS) N/A 03/22/2022   Procedure: UPPER ESOPHAGEAL ENDOSCOPIC ULTRASOUND (EUS);  Surgeon: Irving Copas., MD;  Location: [facility];  Service: Gastroenterology;  Laterality: N/A;  Linear Scope  :   Current Facility-Administered Medications  Medication Dose Route Frequency Provider Last Rate Last Admin   acetaminophen (TYLENOL) tablet 650 mg  650 mg Oral Q6H PRN Shalhoub, Sherryll Burger, MD       Or   acetaminophen (TYLENOL) suppository 650 mg  650 mg Rectal Q6H PRN Shalhoub, Sherryll Burger, MD       amLODipine (NORVASC) tablet 10 mg  10 mg Oral QHS Shalhoub, Sherryll Burger, MD  10 mg at 03/28/22 0039   apixaban (ELIQUIS) tablet 5 mg  5 mg Oral BID Vernelle Emerald, MD   5 mg at 03/28/22 0039   atorvastatin (LIPITOR) tablet 20 mg  20 mg Oral QHS Vernelle Emerald, MD   20 mg  at 03/28/22 0039   fentaNYL (DURAGESIC) 25 MCG/HR 2 patch  2 patch Transdermal Q72H Vernelle Emerald, MD   2 patch at 03/28/22 0044   gabapentin (NEURONTIN) capsule 400 mg  400 mg Oral TID Vernelle Emerald, MD   400 mg at 03/28/22 0039   hydrALAZINE (APRESOLINE) injection 10 mg  10 mg Intravenous Q6H PRN Shalhoub, Sherryll Burger, MD       HYDROmorphone (DILAUDID) tablet 4 mg  4 mg Oral Q4H PRN Shalhoub, Sherryll Burger, MD       Or   HYDROmorphone (DILAUDID) injection 1 mg  1 mg Intravenous Q4H PRN Shalhoub, Sherryll Burger, MD       insulin aspart (novoLOG) injection 0-15 Units  0-15 Units Subcutaneous TID AC & HS Shalhoub, Sherryll Burger, MD       lipase/protease/amylase (CREON) capsule 72,000 Units  72,000 Units Oral TID AC Shalhoub, Sherryll Burger, MD       ondansetron [facility]) tablet 4 mg  4 mg Oral Q6H PRN Vernelle Emerald, MD       Or   ondansetron [facility]) injection 4 mg  4 mg Intravenous Q6H PRN Shalhoub, Sherryll Burger, MD       pantoprazole (PROTONIX) injection 40 mg  40 mg Intravenous Q24H Vernelle Emerald, MD   40 mg at 03/28/22 0038   polyethylene glycol (MIRALAX / GLYCOLAX) packet 17 g  17 g Oral Daily PRN Shalhoub, Sherryll Burger, MD       sodium chloride flush (NS) 0.9 % injection 3 mL  3 mL Intravenous Q12H Shalhoub, Sherryll Burger, MD   3 mL at 03/28/22 0037   sodium chloride tablet 1 g  1 g Oral BID WC Shalhoub, Sherryll Burger, MD          Allergies  Allergen Reactions   Lisinopril Swelling and Other (See Comments)    Facial and upper lip  :   Family History  Problem Relation Age of Onset   Diabetes Sister    Cancer Maternal Grandmother        leukemia    Diabetes Maternal Grandfather    Diabetes Paternal Grandfather    Diabetes Paternal Grandmother    Hypertension Sister    Hypertension Brother    Anesthesia problems Neg Hx   :   Social History   Socioeconomic History   Marital status: Married    Spouse name: Not on file   Number of children: Not on file   Years of education: Not on file    Highest education level: Not on file  Occupational History   Not on file  Tobacco Use   Smoking status: Former    Packs/day: 0.25    Types: Cigarettes    Quit date: 09/20/2019    Years since quitting: 2.5   Smokeless tobacco: Never  Vaping Use   Vaping Use: Never used  Substance and Sexual Activity   Alcohol use: Not Currently    Comment: quit 2022   Drug use: No    Comment: previously    Sexual activity: Yes    Partners: Male    Birth control/protection: Post-menopausal  Other Topics Concern   Not on file  Social History Narrative  Previous [facility] pt.  Last MD was Dr. Delman Cheadle, seen 12/2011      Works part time in [employer] with husband   Social Determinants of Radio broadcast assistant Strain: Not on [employer] Insecurity: Not on file  Transportation Needs: Not on file  Physical Activity: Not on file  Stress: Not on file  Social Connections: Not on file  Intimate Partner Violence: Not on file  :  Review of Systems: A comprehensive 14 point review of systems was negative except as noted in the HPI.  Exam: Patient Vitals for the past 24 hrs:  BP Temp Temp src Pulse Resp SpO2 Height Weight  03/28/22 0829 (!) 141/86 98.1 F (36.7 C) Oral 94 18 98 % -- --  03/28/22 0801 124/84 98.4 F (36.9 C) Oral 98 16 100 % -- --  03/28/22 0745 -- -- -- 93 15 95 % -- --  03/28/22 0730 -- -- -- 94 19 94 % -- --  03/28/22 0715 -- -- -- 99 17 100 % -- --  03/28/22 0700 127/76 -- -- 89 (!) 22 97 % -- --  03/28/22 0600 128/75 -- -- 95 16 96 % -- --  03/28/22 0524 -- 98 F (36.7 C) Oral -- -- -- -- --  03/28/22 0500 (!) 143/77 -- -- 93 16 96 % -- --  03/28/22 0400 134/79 -- -- 97 15 95 % -- --  03/28/22 0300 133/83 -- -- 96 13 96 % -- --  03/28/22 0200 (!) 142/83 -- -- 99 16 99 % -- --  03/28/22 0100 (!) 140/83 -- -- 94 -- 98 % -- --  03/28/22 0039 (!) 141/76 -- -- -- -- -- -- --  03/27/22 2300 (!) 153/92 -- -- 95 16 98 % -- --  03/27/22 2100  -- 97.8 F (36.6 C) Oral -- -- -- -- --  03/27/22 1953 (!) 146/99 -- -- 97 16 100 % -- --  03/27/22 1945 -- -- -- 99 -- 99 % -- --  03/27/22 1930 -- -- -- (!) 106 20 98 % -- --  03/27/22 1915 -- -- -- (!) 102 (!) 21 95 % -- --  03/27/22 1900 -- -- -- (!) 101 15 96 % -- --  03/27/22 1815 (!) 148/82 -- -- 94 14 97 % -- --  03/27/22 1800 (!) 145/83 -- -- 94 14 96 % -- --  03/27/22 1745 133/83 -- -- 96 13 98 % -- --  03/27/22 1730 (!) 126/92 -- -- 92 18 99 % -- --  03/27/22 1715 120/81 -- -- 95 13 98 % -- --  03/27/22 1704 136/82 98.2 F (36.8 C) Oral 96 18 100 % -- --  03/27/22 1700 -- -- -- 94 -- 100 % -- --  03/27/22 1538 (!) 152/89 98.7 F (37.1 C) -- 98 18 100 % 5' 3.5" (1.613 m) 66.7 kg    General:  well-nourished in no acute distress.   Eyes:  no scleral icterus.   ENT:  There were no oropharyngeal lesions.    Respiratory: lungs were clear bilaterally without wheezing or crackles.   Cardiovascular:  Regular rate and rhythm, S1/S2, without murmur, rub or gallop.  GI: Soft, diffusely tender. Skin exam was without echymosis, petichae.   Neuro exam was nonfocal. Patient was alert and oriented.  Attention was good.   Language was appropriate.  Mood was normal without depression.  Speech was not  pressured.  Thought content was not tangential.     Lab Results  Component Value Date   WBC 9.4 03/28/2022   HGB 9.3 (L) 03/28/2022   HCT 28.8 (L) 03/28/2022   PLT 280 03/28/2022   GLUCOSE 150 (H) 03/28/2022   CHOL 147 05/25/2019   TRIG 156.0 (H) 05/25/2019   HDL 49.00 05/25/2019   LDLDIRECT 110 (H) 09/30/2012   LDLCALC 67 05/25/2019   ALT 23 03/28/2022   AST 19 03/28/2022   NA 135 03/28/2022   K 3.5 03/28/2022   CL 102 03/28/2022   CREATININE 0.61 03/28/2022   BUN 7 (L) 03/28/2022   CO2 25 03/28/2022    CT Abdomen Pelvis W Contrast  Result Date: 03/27/2022 CLINICAL DATA:  History of pancreatic cancer, abdominal pain, nausea and vomiting, diarrhea EXAM: CT ABDOMEN AND PELVIS  WITH CONTRAST TECHNIQUE: Multidetector CT imaging of the abdomen and pelvis was performed using the standard protocol following bolus administration of intravenous contrast. RADIATION DOSE REDUCTION: This exam was performed according to the departmental dose-optimization program which includes automated exposure control, adjustment of the mA and/or kV according to patient size and/or use of iterative reconstruction technique. CONTRAST:  149m OMNIPAQUE IOHEXOL 300 MG/ML  SOLN COMPARISON:  03/04/2022, 03/14/2022 FINDINGS: Lower chest: No acute pleural or parenchymal lung disease. Hepatobiliary: Stable position of a common bile duct stent, with interval resolution of the pneumobilia seen on prior study. There is mild to moderate intrahepatic biliary duct dilation. Gallbladder is minimally distended without cholelithiasis or cholecystitis. No focal liver abnormalities. Pancreas: Ill-defined calcified mass within the pancreatic head is again noted, consistent with known history of pancreatic malignancy. This measures approximately 5.1 x 4.4 cm, not appreciably changed. Marked upstream pancreatic duct dilation and pancreatic parenchymal atrophy unchanged. Spleen: Normal in size without focal abnormality. 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 Otherwise the kidneys are unremarkable. The adrenals are normal. Bladder is moderately distended without filling defect. Stomach/Bowel: Interval placement of duodenal stent, extending from the duodenal bulb through the ligament of Treitz. No bowel obstruction or ileus. There is mild wall thickening of the ascending colon, which could reflect inflammatory or infectious colitis. 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 Stable reconstitution of the main portal vein via extensive collaterals in the upper abdomen. Stable atherosclerosis. Numerous small  celiac lymph nodes are again noted, consistent with metastatic disease. Reproductive: Uterus and bilateral adnexa are unremarkable. Other: Trace free fluid within the paracolic gutters. No free intraperitoneal gas. No abdominal wall hernia. Musculoskeletal: No acute or destructive bony lesions. Reconstructed images demonstrate no additional findings. IMPRESSION: 1. Mild wall thickening of the ascending colon consistent with inflammatory or infectious colitis. 2. Interval placement of a duodenal stent. 3. Stable common bile duct stent, with interval resolution of pneumobilia. Stable dilation of the intrahepatic biliary ducts. 4. Stable partially calcified pancreatic mass consistent with known pancreatic malignancy. Stable subcentimeter celiac lymph nodes are concerning for nodal metastases. 5. Stable 1.4 cm lesion lower pole right kidney, previously characterized as renal cell carcinoma by MRI. 6. 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 Electronically Signed   By: MRanda NgoM.D.   On: 03/27/2022 19:02   DG Abd 2 Views  Result Date: 03/14/2022 CLINICAL DATA:  Duodenal stent placement EXAM: ABDOMEN - 2 VIEW COMPARISON:  CT abdomen and pelvis  03/04/2022 FINDINGS: Interval placement of a duodenal stent. The stent projects over the expected course of the duodenal sweep. Redemonstrated biliary stent. Nonobstructive bowel-gas pattern. Tubular marker near the GE junction. Large phleboliths versus fibroid pelvis. IMPRESSION: Interval placement of duodenal stent in expected position. Electronically Signed   By: Placido Sou M.D.   On: 03/14/2022 08:34   DG C-Arm 1-60 Min  Result Date: 03/12/2022 CLINICAL DATA:  Fluoroscopic assistance for placement of stent EXAM: DG C-ARM 1-60 MIN FLUOROSCOPY: Fluoroscopy Time:  90 seconds Radiation Exposure Index (if provided by the fluoroscopic device): 16.5 Number of  Acquired Spot Images: 3 COMPARISON:  CT done on 03/04/2022 FINDINGS: Theophilus Kinds is noted in place. In the early images, biliary stent is noted in the course of the common bile duct. In the final image, there is a new stent oriented transversely, possibly in pancreatic duct or bowel lumen. IMPRESSION: Fluoroscopic assistance was provided for stent placement. Electronically Signed   By: Elmer Picker M.D.   On: 03/12/2022 11:13   CT ABDOMEN PELVIS W CONTRAST  Result Date: 03/04/2022 CLINICAL DATA:  Nausea, vomiting, unspecified abdominal pain EXAM: CT ABDOMEN AND PELVIS WITH CONTRAST TECHNIQUE: Multidetector CT imaging of the abdomen and pelvis was performed using the standard protocol following bolus administration of intravenous contrast. RADIATION DOSE REDUCTION: This exam was performed according to the departmental dose-optimization program which includes automated exposure control, adjustment of the mA and/or kV according to patient size and/or use of iterative reconstruction technique. CONTRAST:  141m OMNIPAQUE IOHEXOL 300 MG/ML  SOLN COMPARISON:  02/14/2022 FINDINGS: Lower chest: No acute abnormality. Hepatobiliary: Palliative metallic biliary stent within the common duct extending through the ampulla. Pneumobilia is in keeping with patency of the a stented segment. Mild intra and extrahepatic biliary ductal dilation is unchanged. Previously noted hepatic abscess drainage catheter has been removed in the interval. Small wedge like region of non enhancement within the left hepatic lobe likely represents a small amount of residual scarring. No focal intrahepatic mass. Gas within the gallbladder lumen again noted. The gallbladder is not distended no pericholecystic inflammatory changes identified. Pancreas: Partially calcified pancreatic head mass is again seen, grossly unchanged from prior examination but poorly delineated on this examination. Dilation of the pancreatic duct distal to the mass is  again seen with associated pancreatic parenchymal atrophy. No superimposed peripancreatic inflammatory changes. Spleen: Unremarkable Adrenals/Urinary Tract: The adrenal glands are unremarkable. The kidneys are normal in size and position. 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 The kidneys are otherwise unremarkable. The bladder is unremarkable. Stomach/Bowel: Stomach is within normal limits. Appendix appears normal. No evidence of bowel wall thickening, distention, or inflammatory changes. No free intraperitoneal gas or fluid. Vascular/Lymphatic: Occlusion of the terminal superior mesenteric vein and splenic vein is again identified in the region of the pancreatic mass. The main portal vein remains patent, reconstituted via the left gastric vein. Moderate aortoiliac atherosclerotic calcification. No aortic aneurysm. No pathologic adenopathy within the abdomen and pelvis. Stable shotty periceliac adenopathy, nonspecific, measuring up to 9 mm in short axis diameter at axial image # 20/2. Reproductive: Uterus and bilateral adnexa are unremarkable. Other: No abdominal wall hernia Musculoskeletal: No acute bone abnormality. No lytic or blastic bone lesion. IMPRESSION: 1. No acute intra-abdominal pathology identified. No definite radiographic explanation for the patient's reported symptoms. 2. Interval removal of hepatic abscess drainage catheter. No recurrent abscess identified. Wedge like region of probable fibrosis in the area of prior abscess. 3. Stable mild intra  and extrahepatic biliary ductal dilation. Palliative metallic biliary stent remains in place. Pneumobilia in keeping with patency of the stented segment. 4. Grossly stable pancreatic head mass with associated occlusion of the terminal superior mesenteric vein and splenic vein. 5. 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 Electronically Signed   By: Fidela Salisbury M.D.   On:  03/04/2022 19:55     CT Abdomen Pelvis W Contrast  Result Date: 03/27/2022 CLINICAL DATA:  History of pancreatic cancer, abdominal pain, nausea and vomiting, diarrhea EXAM: CT ABDOMEN AND PELVIS WITH CONTRAST TECHNIQUE: Multidetector CT imaging of the abdomen and pelvis was performed using the standard protocol following bolus administration of intravenous contrast. RADIATION DOSE REDUCTION: This exam was performed according to the departmental dose-optimization program which includes automated exposure control, adjustment of the mA and/or kV according to patient size and/or use of iterative reconstruction technique. CONTRAST:  163m OMNIPAQUE IOHEXOL 300 MG/ML  SOLN COMPARISON:  03/04/2022, 03/14/2022 FINDINGS: Lower chest: No acute pleural or parenchymal lung disease. Hepatobiliary: Stable position of a common bile duct stent, with interval resolution of the pneumobilia seen on prior study. There is mild to moderate intrahepatic biliary duct dilation. Gallbladder is minimally distended without cholelithiasis or cholecystitis. No focal liver abnormalities. Pancreas: Ill-defined calcified mass within the pancreatic head is again noted, consistent with known history of pancreatic malignancy. This measures approximately 5.1 x 4.4 cm, not appreciably changed. Marked upstream pancreatic duct dilation and pancreatic parenchymal atrophy unchanged. Spleen: Normal in size without focal abnormality. 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 Otherwise the kidneys are unremarkable. The adrenals are normal. Bladder is moderately distended without filling defect. Stomach/Bowel: Interval placement of duodenal stent, extending from the duodenal bulb through the ligament of Treitz. No bowel obstruction or ileus. There is mild wall thickening of the ascending colon, which could reflect inflammatory or infectious colitis.  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 Stable reconstitution of the main portal vein via extensive collaterals in the upper abdomen. Stable atherosclerosis. 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 Reproductive: Uterus and bilateral adnexa are unremarkable. Other: Trace free fluid within the paracolic gutters. No free intraperitoneal gas. No abdominal wall hernia. Musculoskeletal: No acute or destructive bony lesions. Reconstructed images demonstrate no additional findings. IMPRESSION: 1. Mild wall thickening of the ascending colon consistent with inflammatory or infectious colitis. 2. Interval placement of a duodenal stent. 3. Stable common bile duct stent, with interval resolution of pneumobilia. Stable dilation of the intrahepatic biliary ducts. 4. Stable partially calcified pancreatic mass consistent with known pancreatic malignancy. Stable subcentimeter celiac lymph nodes are concerning for nodal metastases. 5. Stable 1.4 cm lesion lower pole right kidney, previously characterized as renal cell carcinoma by MRI. 6. 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 Electronically Signed   By: MRanda NgoM.D.   On: 03/27/2022 19:02   DG Abd 2 Views  Result Date: 03/14/2022 CLINICAL DATA:  Duodenal stent placement EXAM: ABDOMEN - 2 VIEW COMPARISON:  CT abdomen and pelvis 03/04/2022 FINDINGS: Interval placement of a duodenal stent. The stent projects over the expected course of the duodenal sweep. Redemonstrated biliary stent. Nonobstructive bowel-gas pattern. Tubular marker near the GE junction. Large phleboliths versus fibroid pelvis. IMPRESSION: Interval placement of duodenal stent in expected position. Electronically  Signed   By: Placido Sou M.D.   On: 03/14/2022 08:34   DG C-Arm 1-60 Min  Result Date: 03/12/2022 CLINICAL DATA:   Fluoroscopic assistance for placement of stent EXAM: DG C-ARM 1-60 MIN FLUOROSCOPY: Fluoroscopy Time:  90 seconds Radiation Exposure Index (if provided by the fluoroscopic device): 16.5 Number of Acquired Spot Images: 3 COMPARISON:  CT done on 03/04/2022 FINDINGS: Theophilus Kinds is noted in place. In the early images, biliary stent is noted in the course of the common bile duct. In the final image, there is a new stent oriented transversely, possibly in pancreatic duct or bowel lumen. IMPRESSION: Fluoroscopic assistance was provided for stent placement. Electronically Signed   By: Elmer Picker M.D.   On: 03/12/2022 11:13   CT ABDOMEN PELVIS W CONTRAST  Result Date: 03/04/2022 CLINICAL DATA:  Nausea, vomiting, unspecified abdominal pain EXAM: CT ABDOMEN AND PELVIS WITH CONTRAST TECHNIQUE: Multidetector CT imaging of the abdomen and pelvis was performed using the standard protocol following bolus administration of intravenous contrast. RADIATION DOSE REDUCTION: This exam was performed according to the departmental dose-optimization program which includes automated exposure control, adjustment of the mA and/or kV according to patient size and/or use of iterative reconstruction technique. CONTRAST:  166m OMNIPAQUE IOHEXOL 300 MG/ML  SOLN COMPARISON:  02/14/2022 FINDINGS: Lower chest: No acute abnormality. Hepatobiliary: Palliative metallic biliary stent within the common duct extending through the ampulla. Pneumobilia is in keeping with patency of the a stented segment. Mild intra and extrahepatic biliary ductal dilation is unchanged. Previously noted hepatic abscess drainage catheter has been removed in the interval. Small wedge like region of non enhancement within the left hepatic lobe likely represents a small amount of residual scarring. No focal intrahepatic mass. Gas within the gallbladder lumen again noted. The gallbladder is not distended no pericholecystic inflammatory changes identified. Pancreas:  Partially calcified pancreatic head mass is again seen, grossly unchanged from prior examination but poorly delineated on this examination. Dilation of the pancreatic duct distal to the mass is again seen with associated pancreatic parenchymal atrophy. No superimposed peripancreatic inflammatory changes. Spleen: Unremarkable Adrenals/Urinary Tract: The adrenal glands are unremarkable. The kidneys are normal in size and position. 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 The kidneys are otherwise unremarkable. The bladder is unremarkable. Stomach/Bowel: Stomach is within normal limits. Appendix appears normal. No evidence of bowel wall thickening, distention, or inflammatory changes. No free intraperitoneal gas or fluid. Vascular/Lymphatic: Occlusion of the terminal superior mesenteric vein and splenic vein is again identified in the region of the pancreatic mass. The main portal vein remains patent, reconstituted via the left gastric vein. Moderate aortoiliac atherosclerotic calcification. No aortic aneurysm. No pathologic adenopathy within the abdomen and pelvis. Stable shotty periceliac adenopathy, nonspecific, measuring up to 9 mm in short axis diameter at axial image # 20/2. Reproductive: Uterus and bilateral adnexa are unremarkable. Other: No abdominal wall hernia Musculoskeletal: No acute bone abnormality. No lytic or blastic bone lesion. IMPRESSION: 1. No acute intra-abdominal pathology identified. No definite radiographic explanation for the patient's reported symptoms. 2. Interval removal of hepatic abscess drainage catheter. No recurrent abscess identified. Wedge like region of probable fibrosis in the area of prior abscess. 3. Stable mild intra and extrahepatic biliary ductal dilation. Palliative metallic biliary stent remains in place. Pneumobilia in keeping with patency of the stented segment. 4. Grossly stable pancreatic head mass with associated occlusion  of the terminal superior mesenteric vein and splenic vein. 5. 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 Electronically Signed   By: Fidela Salisbury M.D.   On: 03/04/2022 19:55    Assessment and Plan:   1.  Locally recurrent pancreatic adenocarcinoma 2.  Abdominal pain/nausea/vomiting/diarrhea 3.  Acute colitis 4.  Normocytic anemia 5.  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  Type 2 diabetes mellitus 7.  Hypertension 8.  Cirrhosis, history of hepatitis C which has previously been treated 9.  Pancreatic insufficiency 10.  Hyperlipidemia  -The patient's most recent CT scan has been reviewed which shows overall stability of her pancreatic mass and celiac lymph nodes.  Plan is for chemotherapy as an outpatient following hospital discharge. -The patient has abdominal pain, nausea, vomiting, diarrhea likely due to acute colitis.  Agree with checking C. difficile and GI panel.  Continue clear liquid diet.  Continue supportive care with pain medication and antiemetics as needed.  May need GI evaluation if symptoms or not improving. -Hemoglobin is overall stable.  Monitor. -Continue Eliquis. -Management of chronic medical conditions per hospitalist.  Mikey Bussing, DNP, AGPCNP-BC, AOCNP   ADDENDUM: I saw and examined Ms. Kittelson this morning.  Unfortunately, she now has C. difficile.  She says that her pain is not a problem.  Hopefully, the celiac block has helped.  Not sure how she got the C. difficile.  I know with her last hospitalization, I do not think she had any antibiotics.  Her CT scan showed a stable mass at the head of the pancreas.  She had thickening of the colonic wall in the ascending colon.  I am unsure along I will take for her to get over this.  It will take a week or so at least.  She is on clear liquids right now.  Thankfully, this is nothing to do with her cancer.  Does not look like her cancer is causing any problems with  respect to obstruction.  She does have the duodenal stent in place.  Labs today show white cell count of 9.5.  Hemoglobin 9.7.  Platelet count 300,000.  Her sodium is 134.  Potassium 4.0.  BUN less than 5 creatinine 0.56.  Ultimately, regarding have to try to treat the pancreatic cancer itself.  Clearly this will have to wait until after she has the C. difficile treated.  She currently is on IV Flagyl and oral vancomycin.  She looks quite good.  Again, pain really is not the problem.  It is diarrhea.  I appreciate the great care that she is getting from everybody on 3 E.  Lattie Haw, MD  Briscoe Deutscher

## 2022-03-28 NOTE — Assessment & Plan Note (Signed)
   Possible acute colitis superimposed on patient's chronic diarrhea based on CT findings  please see remainder of assessment and plan above

## 2022-03-28 NOTE — Progress Notes (Addendum)
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  Of note patient recently hospitalized at [facility] 7/17 until 8/6 for intractable nausea vomiting and abdominal pain.  Underwent endoscopy 7/21 which showed extrinsic duodenal stenosis/obstruction, she underwent duodenal stent placement 7/25 with improvement of her symptoms.  Patient was also started on Creon during last admission.  Patient presents with worsening abdominal pain, diarrhea.  CT abdomen and pelvis showed mild wall thickening in the ascending colon concerning with inflammatory or infectious colitis.  Patient was admitted for further evaluation.   Assessment & Plan:   Principal Problem:   Intractable generalized abdominal pain Active Problems:   Acute colitis   Mesenteric vein thrombosis (HCC)   Portal vein thrombosis   Malignant neoplasm of pancreas (HCC)   Type 2 diabetes mellitus with hyperglycemia, without long-term current use of insulin (HCC)   Hyponatremia   Essential hypertension   Cirrhosis of liver (HCC)   Pancreatic insufficiency   Mixed hyperlipidemia   Anemia of chronic disease   1-Intractable abdominal pain, diarrhea: Patient presented with abdominal pain, epigastric diffuse, and diarrhea. CT abdomen and pelvis showed focal area of thickening in the ascending colon may be superimposed acute colitis. Lipase unremarkable. GI pathogen and C. difficile ordered. Continue with IV fluids, as needed  opioids. If workup is unrevealing pain might be related to malignancy. Continue with fentanyl patch and oral Dilaudid  2-Acute colitis: Reported diarrhea, CT with focal area of thickening of the ascending colon. Awaiting GI pathogen and C. Difficile Afebrile, no leukocytosis, procalcitonin less than 0.10 Start Flagyl. If C diff negative, consider start ceftriaxone.   Hyponatremia: Multifactorial in the setting of SIADH and dehydration. She received IV fluids. Sodium has normalized. Continue with sodium tablet  Mesenteric vein thrombosis: Continue with Eliquis Portal Vein thrombosis: Continue with Eliquis  Malignant neoplasm of pancreas: Dr. Marin Olp added to the treatment team Continue with fentanyl patch and oral Dilaudid  Diabetes type 2 with hyperglycemia: Sliding scale insulin  Hypertension: Continue with Norvasc  Cirrhosis  of the liver: Longstanding known history of cirrhosis, history of hepatitis C status posttreatment as well as a prior history of alcohol use.  Pancreatic insufficiency: Continue with Creon  Hyperlipidemia: Continue with Lipitor  Anemia of chronic disease: Monitor hb    Estimated body mass index is 25.63 kg/m as calculated from the following:   Height as of this encounter: 5' 3.5" (1.613 m).   Weight as of this encounter: 66.7 kg.   DVT prophylaxis: Eliquis  Code Status: Full code Family Communication: Care discussed with patient.  Disposition Plan:  Status is: Observation The patient remains OBS appropriate and will d/c before 2 midnights.    Consultants:  Dr Marin Olp  Procedures:    Antimicrobials:    Subjective: She report abdominal pain and diarrhea 5 episodes of stool daily. She has been taking creon.    Objective: Vitals:   03/28/22 0500 03/28/22 0524 03/28/22 0600 03/28/22 0700  BP: (!) 143/77  128/75 127/76  Pulse: 93  95 89  Resp: 16  16 (!) 22  Temp:  98  F (36.7 C)    TempSrc:  Oral    SpO2: 96%  96% 97%   Weight:      Height:       No intake or output data in the 24 hours ending 03/28/22 0738 Filed Weights   03/27/22 1538  Weight: 66.7 kg    Examination:  General exam: Appears calm and comfortable  Respiratory system: Clear to auscultation. Respiratory effort normal. Cardiovascular system: S1 & S2 heard, RRR. No JVD, murmurs, rubs, gallops or clicks. No pedal edema. Gastrointestinal system: Abdomen is nondistended, soft , tender, distend.  Central nervous system: Alert and oriented. Extremities: Symmetric 5 x 5 power.   Data Reviewed: I have personally reviewed following labs and imaging studies  CBC: Recent Labs  Lab 03/22/22 0439 03/23/22 0500 03/24/22 0313 03/27/22 1609 03/28/22 0500  WBC 8.7 7.8 14.2* 14.2* 9.4  NEUTROABS  --   --   --  12.5* 7.5  HGB 9.2* 10.0* 9.1* 9.4* 9.3*  HCT 28.1* 31.1* 27.7* 29.4* 28.8*  MCV 86.5 87.4 86.6 88.6 87.3  PLT 300 347 299 293 563   Basic Metabolic Panel: Recent Labs  Lab 03/22/22 0439 03/23/22 0500 03/27/22 1609 03/28/22 0152 03/28/22 0500  NA 131* 128* 128* 134* 135  K 3.6 4.0 3.7 3.4* 3.5  CL 97* 95* 98 102 102  CO2 '27 25 23 25 25  '$ GLUCOSE 183* 363* 219* 152* 150*  BUN 5* 7* 12 8 7*  CREATININE 0.54 0.65 0.73 0.61 0.61  CALCIUM 8.0* 8.2* 8.3* 8.4* 8.3*  MG 1.6* 2.3  --   --  1.7   GFR: Estimated Creatinine Clearance: 65.1 mL/min (by C-G formula based on SCr of 0.61 mg/dL). Liver Function Tests: Recent Labs  Lab 03/27/22 1609 03/28/22 0500  AST 23 19  ALT 24 23  ALKPHOS 71 68  BILITOT 0.6 0.6  PROT 6.5 6.4*  ALBUMIN 2.8* 2.6*   Recent Labs  Lab 03/28/22 0152  LIPASE 29   No results for input(s): "AMMONIA" in the last 168 hours. Coagulation Profile: Recent Labs  Lab 03/28/22 0500  INR 1.4*   Cardiac Enzymes: No results for input(s): "CKTOTAL", "CKMB", "CKMBINDEX", "TROPONINI" in the last 168 hours. BNP (last 3 results) No results for input(s): "PROBNP" in the last 8760 hours. HbA1C: No  results for input(s): "HGBA1C" in the last 72 hours. CBG: Recent Labs  Lab 03/23/22 1127 03/23/22 1740 03/23/22 2143 03/24/22 0726 03/24/22 1140  GLUCAP 286* 263* 143* 175* 171*   Lipid Profile: No results for input(s): "CHOL", "HDL", "LDLCALC", "TRIG", "CHOLHDL", "LDLDIRECT" in the last 72 hours. Thyroid Function Tests: No results for input(s): "TSH", "T4TOTAL", "FREET4", "T3FREE", "THYROIDAB" in the last 72 hours. Anemia Panel: No results for input(s): "VITAMINB12", "FOLATE", "FERRITIN", "TIBC", "IRON", "RETICCTPCT" in the last 72 hours. Sepsis Labs: Recent Labs  Lab 03/28/22 0152  PROCALCITON <0.10    No results found for this or any previous visit (from the past 240 hour(s)).       Radiology Studies: CT Abdomen Pelvis W Contrast  Result Date: 03/27/2022 CLINICAL DATA:  History of pancreatic cancer, abdominal pain, nausea and vomiting, diarrhea EXAM: CT ABDOMEN AND PELVIS WITH CONTRAST TECHNIQUE: Multidetector CT imaging of the abdomen and pelvis was performed using the standard protocol following bolus administration of intravenous contrast. RADIATION DOSE REDUCTION: This exam was performed according to the departmental dose-optimization program which includes automated exposure control, adjustment of the mA and/or kV according to patient size and/or use of iterative reconstruction technique.  CONTRAST:  13m OMNIPAQUE IOHEXOL 300 MG/ML  SOLN COMPARISON:  03/04/2022, 03/14/2022 FINDINGS: Lower chest: No acute pleural or parenchymal lung disease. Hepatobiliary: Stable position of a common bile duct stent, with interval resolution of the pneumobilia seen on prior study. There is mild to moderate intrahepatic biliary duct dilation. Gallbladder is minimally distended without cholelithiasis or cholecystitis. No focal liver abnormalities. Pancreas: Ill-defined calcified mass within the pancreatic head is again noted, consistent with known history of pancreatic malignancy. This measures  approximately 5.1 x 4.4 cm, not appreciably changed. Marked upstream pancreatic duct dilation and pancreatic parenchymal atrophy unchanged. Spleen: Normal in size without focal abnormality. 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 Otherwise the kidneys are unremarkable. The adrenals are normal. Bladder is moderately distended without filling defect. Stomach/Bowel: Interval placement of duodenal stent, extending from the duodenal bulb through the ligament of Treitz. No bowel obstruction or ileus. There is mild wall thickening of the ascending colon, which could reflect inflammatory or infectious colitis. 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 Stable reconstitution of the main portal vein via extensive collaterals in the upper abdomen. Stable atherosclerosis. 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 Reproductive: Uterus and bilateral adnexa are unremarkable. Other: Trace free fluid within the paracolic gutters. No free intraperitoneal gas. No abdominal wall hernia. Musculoskeletal: No acute or destructive bony lesions. Reconstructed images demonstrate no additional findings. IMPRESSION: 1. Mild wall thickening of the ascending colon consistent with inflammatory or infectious colitis. 2. Interval placement of a duodenal stent. 3. Stable common bile duct stent, with interval resolution of pneumobilia. Stable dilation of the intrahepatic biliary ducts. 4. Stable partially calcified pancreatic mass consistent with known pancreatic malignancy. Stable subcentimeter celiac lymph nodes are concerning for nodal metastases. 5. Stable 1.4 cm lesion lower pole right kidney, previously characterized as renal cell carcinoma by MRI. 6. 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 Electronically Signed   By: MRanda NgoM.D.   On: 03/27/2022 19:02        Scheduled Meds:  amLODipine  10 mg Oral QHS   apixaban  5 mg Oral BID   atorvastatin  20 mg Oral QHS   fentaNYL  2 patch Transdermal Q72H   gabapentin  400 mg Oral TID   insulin aspart  0-15 Units Subcutaneous TID AC & HS   lipase/protease/amylase  72,000 Units Oral TID AC   pantoprazole (PROTONIX) IV  40 mg Intravenous Q24H   sodium chloride flush  3 mL Intravenous Q12H   sodium chloride  1 g Oral BID WC   Continuous Infusions:   LOS: 0 days    Time spent: 35 minutes/     Berea Majkowski A Benjamin Merrihew, MD [facility]   If 7PM-7AM, please contact night-coverage [email or web address]  03/28/2022, 7:38 AM

## 2022-03-28 NOTE — ED Notes (Signed)
The patient has ambulated to and from restroom multiple times but she has not had a BM. Not able to collect the c-diff or gastrointestinal panel yet.

## 2022-03-28 NOTE — Assessment & Plan Note (Addendum)
   Patient presenting with complaints of diffuse abdominal pain, worse in the epigastric region despite recent celiac block during last hospitalization.  Diffuse abdominal tenderness out of proportion with exam  CT imaging of the abdomen pelvis only seems to reveal a focal area of thickening of the ascending colon that may be a superimposed acute colitis although I am doubtful this is actually causing the patient's substantial pain  Hepatic function panel, lipase, urinalysis unremarkable or additional potential causes of abdominal pain  Will obtain and send off stool for GI pathogen panel and C. Difficile  Obtaining inflammatory markers/procalcitonin -if these are markedly elevated or if patient exhibits a fever will initiate empiric antibiotics  Treating patient with as needed opiate-based analgesics for now as well as as needed antiemetics  Clear liquid diet for now which can be advanced as tolerated  If workup does not reveal an obvious infectious/reversible cause of patient's diarrhea and infection will consider obtaining gastroenterology consultation  If her workup is negative is very likely that patient's symptoms are due to progressive malignancy or complications of the malignancy and a palliative consultation/approach may be in order

## 2022-03-29 ENCOUNTER — Inpatient Hospital Stay: Payer: BC Managed Care – PPO | Admitting: Nurse Practitioner

## 2022-03-29 ENCOUNTER — Telehealth (HOSPITAL_COMMUNITY): Payer: Self-pay | Admitting: Pharmacy Technician

## 2022-03-29 ENCOUNTER — Telehealth: Payer: Self-pay | Admitting: Nurse Practitioner

## 2022-03-29 ENCOUNTER — Encounter: Payer: Self-pay | Admitting: Hematology & Oncology

## 2022-03-29 ENCOUNTER — Other Ambulatory Visit (HOSPITAL_COMMUNITY): Payer: Self-pay

## 2022-03-29 DIAGNOSIS — R1084 Generalized abdominal pain: Secondary | ICD-10-CM | POA: Diagnosis not present

## 2022-03-29 LAB — BASIC METABOLIC PANEL
Anion gap: 5 (ref 5–15)
BUN: <5 mg/dL — ABNORMAL LOW (ref 8–23)
CO2: 26 mmol/L (ref 22–32)
Calcium: 8.5 mg/dL — ABNORMAL LOW (ref 8.9–10.3)
Chloride: 103 mmol/L (ref 98–111)
Creatinine, Ser: 0.56 mg/dL (ref 0.44–1.00)
GFR, Estimated: >60 mL/min (ref >60)
Glucose, Bld: 145 mg/dL — ABNORMAL HIGH (ref 70–99)
Potassium: 4 mmol/L (ref 3.5–5.1)
Sodium: 134 mmol/L — ABNORMAL LOW (ref 135–145)

## 2022-03-29 LAB — CBC
HCT: 29.6 % — ABNORMAL LOW (ref 36.0–46.0)
Hemoglobin: 9.7 g/dL — ABNORMAL LOW (ref 12.0–15.0)
MCH: 28.8 pg (ref 26.0–34.0)
MCHC: 32.8 g/dL (ref 30.0–36.0)
MCV: 87.8 fL (ref 80.0–100.0)
Platelets: 300 10*3/uL (ref 150–400)
RBC: 3.37 MIL/uL — ABNORMAL LOW (ref 3.87–5.11)
RDW: 15.4 % (ref 11.5–15.5)
WBC: 9.5 10*3/uL (ref 4.0–10.5)
nRBC: 0 % (ref 0.0–0.2)

## 2022-03-29 LAB — GASTROINTESTINAL PANEL BY PCR, STOOL (REPLACES STOOL CULTURE)

## 2022-03-29 LAB — GLUCOSE, CAPILLARY
Glucose-Capillary: 110 mg/dL — ABNORMAL HIGH (ref 70–99)
Glucose-Capillary: 138 mg/dL — ABNORMAL HIGH (ref 70–99)
Glucose-Capillary: 155 mg/dL — ABNORMAL HIGH (ref 70–99)
Glucose-Capillary: 215 mg/dL — ABNORMAL HIGH (ref 70–99)
Glucose-Capillary: 245 mg/dL — ABNORMAL HIGH (ref 70–99)
Glucose-Capillary: 87 mg/dL (ref 70–99)

## 2022-03-29 MED ORDER — ADULT MULTIVITAMIN W/MINERALS CH
1.0000 | ORAL_TABLET | Freq: Every day | ORAL | Status: DC
  Administered 2022-03-29 – 2022-04-05 (×8): 1 via ORAL
  Filled 2022-03-29 (×8): qty 1

## 2022-03-29 MED ORDER — BOOST / RESOURCE BREEZE PO LIQD CUSTOM
1.0000 | Freq: Three times a day (TID) | ORAL | Status: DC
  Administered 2022-03-29 – 2022-04-02 (×8): 1 via ORAL

## 2022-03-29 MED ORDER — PROSOURCE PLUS PO LIQD
30.0000 mL | Freq: Three times a day (TID) | ORAL | Status: DC
  Administered 2022-03-29 – 2022-04-04 (×18): 30 mL via ORAL
  Filled 2022-03-29 (×19): qty 30

## 2022-03-29 MED ORDER — SODIUM CHLORIDE 0.9% FLUSH: 10.0000 mL | INTRAVENOUS | Status: DC | PRN

## 2022-03-29 MED ORDER — CHLORHEXIDINE GLUCONATE CLOTH 2 % EX PADS
6.0000 | MEDICATED_PAD | Freq: Every day | CUTANEOUS | Status: DC
Start: 2022-03-29 — End: 2022-04-05
  Administered 2022-03-29 – 2022-04-05 (×8): 6 via TOPICAL

## 2022-03-29 MED ORDER — LIP MEDEX EX OINT
TOPICAL_OINTMENT | CUTANEOUS | Status: DC | PRN
  Filled 2022-03-29: qty 7

## 2022-03-29 NOTE — Telephone Encounter (Signed)
Caller Name: Rael Tilly Call back phone #: [phone]  Reason for Call: Pt has been hospitalized with Melanie Alvarez since Wednesday (8/09) and will be missing her appt today (8/11) wants to make sure she is not charged. Would also like a call from cma.

## 2022-03-29 NOTE — TOC Benefit Eligibility Note (Signed)
Patient Teacher, English as a foreign language completed.    The patient is currently admitted and upon discharge could be taking vancomycin 125 mg capsules.  The current 10 day co-pay is $0.00.   The patient is insured through [place], [place] Patient [place] Patient Advocate Team Direct Number: [phone]  Fax: [phone]

## 2022-03-29 NOTE — Progress Notes (Signed)
  Transition of Care (TOC) Screening Note   Patient Details  Name: Melanie Alvarez Date of Birth: [birth year]   Transition of Care [facility]) CM/SW Contact:    Lennart Pall, LCSW Phone Number: 03/29/2022, 9:56 AM    Transition of Care Department [facility]) has reviewed patient and no TOC needs have been identified at this time. We will continue to monitor patient advancement through interdisciplinary progression rounds. If new patient transition needs arise, please place a TOC consult.

## 2022-03-29 NOTE — Telephone Encounter (Signed)
Pharmacy Patient Advocate Encounter  Insurance verification completed.    The patient is insured through [employer]   The patient is currently admitted and ran test claims for the following: vancomycin capsules.  Copays and coinsurance results were relayed to Inpatient clinical team.

## 2022-03-29 NOTE — Progress Notes (Signed)
Initial Nutrition Assessment  DOCUMENTATION CODES:   Not applicable  INTERVENTION:   -Boost Breeze po TID, each supplement provides 250 kcal and 9 grams of protein  -30 ml Prosource Plus TID, each supplement provides 100 kcalsd and 15 grams protein -MVI with minerals daily -RD will follow for diet advancement and adjust supplement regimen as appropriate  NUTRITION DIAGNOSIS:   Inadequate oral intake related to poor appetite as evidenced by per patient/family report.  GOAL:   Patient will meet greater than or equal to 90% of their needs  MONITOR:   PO intake, Supplement acceptance, Diet advancement  REASON FOR ASSESSMENT:   Malnutrition Screening Tool    ASSESSMENT:   Pt with past medical history of hepatitis C status posttreatment (with Dr. 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 Marin Olp, recurrence of disease identified 10/1538) complicated by portal vein and mesenteric vein thrombosis (Dx 12/2021, on Eliquis therapy), history of hepatic abscess (admitted May/June 2023), non-insulin dependent diabetes mellitus type 2, hypertension, hyperlipidemia presenting with complaints of abdominal pain nausea vomiting and diarrhea.  Pt admitted with intractable abdominal pain, diarrhea, acute colitis, and c-diff.   Reviewed I/O's: +1.6 L x 24 hours  UOP: 250 ml x 24 hours   Pt unavailable at time of visit. Attempted to speak with pt via call to hospital room phone, however, unable to reach. RD unable to obtain further nutrition-related history or complete nutrition-focused physical exam at this time.    Pt currently on a full liquid diet. No meal completion data available to assess at this time.   Reviewed wt hx; pt has experienced a 21.3% wt loss over the past 3 months, which is significant for time frame.   Pt at high risk for malnutrition, but unable to identify at this time. Pt would greatly benefit  from addition of oral nutrition supplements.   Medications reviewed and include creon.   Lab Results  Component Value Date   HGBA1C 5.7 (H) 03/08/2022   PTA DM medications are 500 mg metformin BID.   Labs reviewed: Na: 134, CBGS: 87-245 (inpatient orders for glycemic control are 0-15 units insulin aspart TID daily before meals and at bedtime).    Diet Order:   Diet Order             Diet full liquid Room service appropriate? Yes; Fluid consistency: Thin  Diet effective now                   EDUCATION NEEDS:   No education needs have been identified at this time  Skin:  Skin Assessment: Reviewed RN Assessment  Last BM:  03/29/22  Height:   Ht Readings from Last 1 Encounters:  03/27/22 5' 3.5" (1.613 m)    Weight:   Wt Readings from Last 1 Encounters:  03/27/22 66.7 kg    Ideal Body Weight:  53.4 kg  BMI:  Body mass index is 25.63 kg/m.  Estimated Nutritional Needs:   Kcal:  1850-2050  Protein:  105-120 grams  Fluid:  > 1.8 L    Loistine Chance, RD, LDN, [place] Registered Dietitian II Certified Diabetes Care and Education Specialist Please refer to [facility] for RD and/or RD on-call/weekend/after hours pager

## 2022-03-29 NOTE — Telephone Encounter (Signed)
Called & spoke w/ pt. Says she tried calling up here on Wednesday 8/9 to let us know she was in the hospital. Adv pt that we will cancel her appt for this morning and she is to call us back once she has been discharged form the hospital , for a hospital f/u.

## 2022-03-29 NOTE — Progress Notes (Signed)
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  Of note patient recently hospitalized at [facility] 7/17 until 8/6 for intractable nausea vomiting and abdominal pain.  Underwent endoscopy 7/21 which showed extrinsic duodenal stenosis/obstruction, she underwent duodenal stent placement 7/25 with improvement of her symptoms.  Patient was also started on Creon during last admission.  Patient presents with worsening abdominal pain, diarrhea.  CT abdomen and pelvis showed mild wall thickening in the ascending colon concerning with inflammatory or infectious colitis.  Patient was admitted for further evaluation.  Patient was found to have C. difficile colitis and norovirus.  She was a started on oral vancomycin 8/10.   Assessment & Plan:   Principal Problem:   Intractable generalized abdominal pain Active Problems:   Acute colitis   Hyponatremia   Portal vein thrombosis   Mesenteric vein thrombosis (HCC)   Malignant neoplasm of pancreas (HCC)   Type 2 diabetes mellitus with hyperglycemia, without long-term current use of insulin (HCC)   Essential hypertension   Cirrhosis of liver (HCC)   Pancreatic insufficiency   Mixed hyperlipidemia   Anemia of chronic disease   Acute diarrhea   1-Intractable abdominal pain, diarrhea: Patient presented with abdominal pain, epigastric diffuse, and diarrhea. CT abdomen and pelvis showed focal area of thickening in the ascending colon may be  superimposed acute colitis. Lipase unremarkable. GI pathogen Norovirus. and C. difficile Positive Continue with as needed opioids. Continue with fentanyl patch and oral Dilaudid  2-Acute colitis: C diff  Reported diarrhea, CT with focal area of thickening of the ascending colon. GI pathogen Positive  for Norovirus and C. Difficile Positive.  Afebrile, no leukocytosis, procalcitonin less than 0.10 Start Flagyl. If C diff negative, consider start ceftriaxone.   Hyponatremia: Multifactorial in the setting of SIADH and dehydration. She received IV fluids. Sodium stable.  Continue with sodium tablet  Mesenteric vein thrombosis: Continue with Eliquis Portal Vein thrombosis: Continue with Eliquis  Malignant neoplasm of pancreas: Dr. Marin Olp added to the treatment team Continue with fentanyl patch and oral Dilaudid  Diabetes type 2 with hyperglycemia: Sliding scale insulin  Hypertension: Continue with Norvasc  Cirrhosis  of the liver: Longstanding known history of cirrhosis, history of hepatitis C status posttreatment as well as a prior history of alcohol use.  Pancreatic insufficiency: Continue with Creon  Hyperlipidemia: Continue with Lipitor  Anemia of chronic disease: Monitor hb    Estimated body mass index is 25.63 kg/m as calculated from the following:   Height as of this encounter: 5' 3.5" (1.613 m).   Weight as of this encounter: 66.7 kg.   DVT prophylaxis: Eliquis  Code Status: Full code Family Communication: Care discussed with patient.  Disposition Plan:  Status is: Observation The patient remains OBS appropriate and will d/c before 2 midnights.    Consultants:  Dr Marin Olp  Procedures:    Antimicrobials:    Subjective: She has had 2 watery BM today. Report abdominal pain is same.   Objective: Vitals:   03/28/22 1324 03/28/22 2001 03/29/22 0140 03/29/22 0519  BP: 135/71 134/75 124/72 129/71  Pulse:  86 83 74 84  Resp: '18 18 18 19  '$ Temp:  97.8 F (36.6 C) 97.8 F (36.6 C) 98 F (36.7 C) (!) 97.5 F (36.4 C)  TempSrc: Oral Oral Oral Oral  SpO2: 100% 100% 91% 97%  Weight:      Height:        Intake/Output Summary (Last 24 hours) at 03/29/2022 1317 Last data filed at 03/29/2022 1000 Gross per 24 hour  Intake 1780 ml  Output 250 ml  Net 1530 ml   Filed Weights   03/27/22 1538  Weight: 66.7 kg    Examination:  General exam: NAD Respiratory system: CTA Cardiovascular system: S 1, S 2 RRR Gastrointestinal system: BS present, soft, tender Central nervous system: alert Extremities: no edema   Data Reviewed: I have personally reviewed following labs and imaging studies  CBC: Recent Labs  Lab 03/23/22 0500 03/24/22 0313 03/27/22 1609 03/28/22 0500 03/29/22 0453  WBC 7.8 14.2* 14.2* 9.4 9.5  NEUTROABS  --   --  12.5* 7.5  --   HGB 10.0* 9.1* 9.4* 9.3* 9.7*  HCT 31.1* 27.7* 29.4* 28.8* 29.6*  MCV 87.4 86.6 88.6 87.3 87.8  PLT 347 299 293 280 161    Basic Metabolic Panel: Recent Labs  Lab 03/23/22 0500 03/27/22 1609 03/28/22 0152 03/28/22 0500 03/29/22 0453  NA 128* 128* 134* 135 134*  K 4.0 3.7 3.4* 3.5 4.0  CL 95* 98 102 102 103  CO2 '25 23 25 25 26  '$ GLUCOSE 363* 219* 152* 150* 145*  BUN 7* 12 8 7* <5*  CREATININE 0.65 0.73 0.61 0.61 0.56  CALCIUM 8.2* 8.3* 8.4* 8.3* 8.5*  MG 2.3  --   --  1.7  --     GFR: Estimated Creatinine Clearance: 65.1 mL/min (by C-G formula based on SCr of 0.56 mg/dL). Liver Function Tests: Recent Labs  Lab 03/27/22 1609 03/28/22 0500  AST 23 19  ALT 24 23  ALKPHOS 71 68  BILITOT 0.6 0.6  PROT 6.5 6.4*  ALBUMIN 2.8* 2.6*    Recent Labs  Lab 03/28/22 0152  LIPASE 29    No results for input(s): "AMMONIA" in the last 168 hours. Coagulation Profile: Recent Labs  Lab 03/28/22 0500  INR 1.4*    Cardiac Enzymes: No results for input(s): "CKTOTAL", "CKMB", "CKMBINDEX", "TROPONINI" in the last 168 hours. BNP (last 3 results) No results for input(s):  "PROBNP" in the last 8760 hours. HbA1C: No results for input(s): "HGBA1C" in the last 72 hours. CBG: Recent Labs  Lab 03/28/22 2142 03/28/22 2239 03/28/22 2358 03/29/22 0733 03/29/22 1151  GLUCAP 62* 87 245* 155* 138*    Lipid Profile: No results for input(s): "CHOL", "HDL", "LDLCALC", "TRIG", "CHOLHDL", "LDLDIRECT" in the last 72 hours. Thyroid Function Tests: No results for input(s): "TSH", "T4TOTAL", "FREET4", "T3FREE", "THYROIDAB" in the last 72 hours. Anemia Panel: No results for input(s): "VITAMINB12", "FOLATE", "FERRITIN", "TIBC", "IRON", "RETICCTPCT" in the last 72 hours. Sepsis Labs: Recent Labs  Lab 03/28/22 0152  PROCALCITON <0.10     Recent Results (from the past 240 hour(s))  C Difficile Quick Screen w PCR reflex     Status: Abnormal   Collection Time: 03/28/22 12:27 PM   Specimen: STOOL  Result Value Ref Range Status   C Diff antigen POSITIVE (A) NEGATIVE Final    Comment: RESULT CALLED TO, READ BACK BY AND VERIFIED WITH: rn p armstrong at 1537 03/28/22 cruickshank a    C Diff toxin POSITIVE (A) NEGATIVE Final  Comment: RESULT CALLED TO, READ BACK BY AND VERIFIED WITH: rn p armstrong at 1537 03/28/22 cruickshank a    C Diff interpretation Toxin producing C. difficile detected.  Final    Comment: CRITICAL RESULT CALLED TO, READ BACK BY AND VERIFIED WITH: RN P ARMSTRONG AT 1537 03/28/22 CRUICKSHANK A Performed at [facility], [place] [address], [place], [place] [address]   Gastrointestinal Panel by PCR , Stool     Status: Abnormal   Collection Time: 03/28/22 12:27 PM   Specimen: Stool  Result Value Ref Range Status   Campylobacter species NOT DETECTED NOT DETECTED Final   Plesimonas shigelloides NOT DETECTED NOT DETECTED Final   Salmonella species NOT DETECTED NOT DETECTED Final   Yersinia enterocolitica NOT DETECTED NOT DETECTED Final   Vibrio species NOT DETECTED NOT DETECTED Final   Vibrio cholerae NOT DETECTED NOT DETECTED Final    Enteroaggregative E coli (EAEC) NOT DETECTED NOT DETECTED Final   Enteropathogenic E coli (EPEC) NOT DETECTED NOT DETECTED Final   Enterotoxigenic E coli (ETEC) NOT DETECTED NOT DETECTED Final   Shiga like toxin producing E coli (STEC) NOT DETECTED NOT DETECTED Final   Shigella/Enteroinvasive E coli (EIEC) NOT DETECTED NOT DETECTED Final   Cryptosporidium NOT DETECTED NOT DETECTED Final   Cyclospora cayetanensis NOT DETECTED NOT DETECTED Final   Entamoeba histolytica NOT DETECTED NOT DETECTED Final   Giardia lamblia NOT DETECTED NOT DETECTED Final   Adenovirus F40/41 NOT DETECTED NOT DETECTED Final   Astrovirus NOT DETECTED NOT DETECTED Final   Norovirus GI/GII DETECTED (A) NOT DETECTED Final    Comment: RESULT CALLED TO, READ BACK BY AND VERIFIED WITH: SUSAN KUSNITZ AT 1139 03/29/22.PMF    Rotavirus A NOT DETECTED NOT DETECTED Final   Sapovirus (I, II, IV, and V) NOT DETECTED NOT DETECTED Final    Comment: Performed at [facility], [address] [place], [place] [address]         Radiology Studies: CT Abdomen Pelvis W Contrast  Result Date: 03/27/2022 CLINICAL DATA:  History of pancreatic cancer, abdominal pain, nausea and vomiting, diarrhea EXAM: CT ABDOMEN AND PELVIS WITH CONTRAST TECHNIQUE: Multidetector CT imaging of the abdomen and pelvis was performed using the standard protocol following bolus administration of intravenous contrast. RADIATION DOSE REDUCTION: This exam was performed according to the departmental dose-optimization program which includes automated exposure control, adjustment of the mA and/or kV according to patient size and/or use of iterative reconstruction technique. CONTRAST:  19m OMNIPAQUE IOHEXOL 300 MG/ML  SOLN COMPARISON:  03/04/2022, 03/14/2022 FINDINGS: Lower chest: No acute pleural or parenchymal lung disease. Hepatobiliary: Stable position of a common bile duct stent, with interval resolution of the pneumobilia seen on prior study. There is  mild to moderate intrahepatic biliary duct dilation. Gallbladder is minimally distended without cholelithiasis or cholecystitis. No focal liver abnormalities. Pancreas: Ill-defined calcified mass within the pancreatic head is again noted, consistent with known history of pancreatic malignancy. This measures approximately 5.1 x 4.4 cm, not appreciably changed. Marked upstream pancreatic duct dilation and pancreatic parenchymal atrophy unchanged. Spleen: Normal in size without focal abnormality. 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 Otherwise the kidneys are unremarkable. The adrenals are normal. Bladder is moderately distended without filling defect. Stomach/Bowel: Interval placement of duodenal stent, extending from the duodenal bulb through the ligament of Treitz. No bowel obstruction or ileus. There is mild wall thickening of the ascending colon, which could  reflect inflammatory or infectious colitis. 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 Stable reconstitution of the main portal vein via extensive collaterals in the upper abdomen. Stable atherosclerosis. 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 Reproductive: Uterus and bilateral adnexa are unremarkable. Other: Trace free fluid within the paracolic gutters. No free intraperitoneal gas. No abdominal wall hernia. Musculoskeletal: No acute or destructive bony lesions. Reconstructed images demonstrate no additional findings. IMPRESSION: 1. Mild wall thickening of the ascending colon consistent with inflammatory or infectious colitis. 2. Interval placement of a duodenal stent. 3. Stable common bile duct stent, with interval resolution of pneumobilia. Stable dilation of the intrahepatic biliary ducts. 4. Stable partially calcified pancreatic mass consistent with known pancreatic malignancy.  Stable subcentimeter celiac lymph nodes are concerning for nodal metastases. 5. Stable 1.4 cm lesion lower pole right kidney, previously characterized as renal cell carcinoma by MRI. 6. 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 Electronically Signed   By: Randa Ngo M.D.   On: 03/27/2022 19:02        Scheduled Meds:  amLODipine  10 mg Oral QHS   apixaban  5 mg Oral BID   atorvastatin  20 mg Oral QHS   Chlorhexidine Gluconate Cloth  6 each Topical Daily   fentaNYL  2 patch Transdermal Q72H   gabapentin  400 mg Oral TID   insulin aspart  0-15 Units Subcutaneous TID AC & HS   lipase/protease/amylase  72,000 Units Oral TID AC   pantoprazole (PROTONIX) IV  40 mg Intravenous Q24H   sodium chloride flush  3 mL Intravenous Q12H   sodium chloride  1 g Oral BID WC   vancomycin  125 mg Oral QID   Continuous Infusions:  metronidazole 500 mg (03/29/22 0516)     LOS: 1 day    Time spent: 72 minutes/     Jada Kuhnert A Laquanta Hummel, MD [facility]   If 7PM-7AM, please contact night-coverage [email or web address]  03/29/2022, 1:17 PM

## 2022-03-29 NOTE — Plan of Care (Signed)
  Problem: Activity: Goal: Risk for activity intolerance will decrease Outcome: Progressing   Problem: Nutrition: Goal: Adequate nutrition will be maintained Outcome: Progressing   Problem: Elimination: Goal: Will not experience complications related to bowel motility Outcome: Progressing   Problem: Pain Managment: Goal: General experience of comfort will improve Outcome: Progressing   

## 2022-03-30 DIAGNOSIS — R1084 Generalized abdominal pain: Secondary | ICD-10-CM | POA: Diagnosis not present

## 2022-03-30 LAB — BASIC METABOLIC PANEL
Anion gap: 5 (ref 5–15)
BUN: 6 mg/dL — ABNORMAL LOW (ref 8–23)
CO2: 24 mmol/L (ref 22–32)
Calcium: 8.4 mg/dL — ABNORMAL LOW (ref 8.9–10.3)
Chloride: 104 mmol/L (ref 98–111)
Creatinine, Ser: 0.51 mg/dL (ref 0.44–1.00)
GFR, Estimated: >60 mL/min (ref >60)
Glucose, Bld: 159 mg/dL — ABNORMAL HIGH (ref 70–99)
Potassium: 3.3 mmol/L — ABNORMAL LOW (ref 3.5–5.1)
Sodium: 133 mmol/L — ABNORMAL LOW (ref 135–145)

## 2022-03-30 LAB — CBC
HCT: 28.3 % — ABNORMAL LOW (ref 36.0–46.0)
Hemoglobin: 9.2 g/dL — ABNORMAL LOW (ref 12.0–15.0)
MCH: 28.8 pg (ref 26.0–34.0)
MCHC: 32.5 g/dL (ref 30.0–36.0)
MCV: 88.7 fL (ref 80.0–100.0)
Platelets: 300 10*3/uL (ref 150–400)
RBC: 3.19 MIL/uL — ABNORMAL LOW (ref 3.87–5.11)
RDW: 15.4 % (ref 11.5–15.5)
WBC: 8.1 10*3/uL (ref 4.0–10.5)
nRBC: 0 % (ref 0.0–0.2)

## 2022-03-30 LAB — GLUCOSE, CAPILLARY
Glucose-Capillary: 184 mg/dL — ABNORMAL HIGH (ref 70–99)
Glucose-Capillary: 259 mg/dL — ABNORMAL HIGH (ref 70–99)
Glucose-Capillary: 166 mg/dL — ABNORMAL HIGH (ref 70–99)
Glucose-Capillary: 126 mg/dL — ABNORMAL HIGH (ref 70–99)

## 2022-03-30 MED ORDER — BIOTENE DRY MOUTH MT LIQD
15.0000 mL | OROMUCOSAL | Status: DC | PRN
  Filled 2022-03-30 (×2): qty 15

## 2022-03-30 MED ORDER — FLUCONAZOLE 100 MG PO TABS
100.0000 mg | ORAL_TABLET | Freq: Every day | ORAL | Status: DC
  Administered 2022-03-30 – 2022-04-05 (×7): 100 mg via ORAL
  Filled 2022-03-30 (×7): qty 1

## 2022-03-30 MED ORDER — FENTANYL 25 MCG/HR TD PT72
2.0000 | MEDICATED_PATCH | TRANSDERMAL | Status: DC
  Administered 2022-03-30: 2 via TRANSDERMAL
  Filled 2022-03-30: qty 2

## 2022-03-30 MED ORDER — CHLORHEXIDINE GLUCONATE 0.12 % MT SOLN
OROMUCOSAL | Status: AC
  Administered 2022-03-30: 15 mL
  Filled 2022-03-30: qty 15

## 2022-03-30 MED ORDER — NYSTATIN 100000 UNIT/ML MT SUSP
5.0000 mL | Freq: Four times a day (QID) | OROMUCOSAL | Status: DC
  Administered 2022-03-30 – 2022-04-05 (×23): 500000 [IU] via ORAL
  Filled 2022-03-30 (×23): qty 5

## 2022-03-30 NOTE — Progress Notes (Signed)
OT Cancellation Note  Patient Details Name: HALLELUJAH WYSONG MRN: [id number] DOB: [birth year]   Cancelled Treatment:    Reason Eval/Treat Not Completed: OT screened, no needs identified, will sign off. Per conversation with pt and nursing, pt is at baseline with ADLs. Walked the halls earlier and has been ambulating in the room independently.   Tyrone Schimke, OT Acute Rehabilitation Services Office: [phone]   03/30/2022, 9:17 AM

## 2022-03-30 NOTE — Plan of Care (Signed)
  Problem: Clinical Measurements: Goal: Ability to maintain clinical measurements within normal limits will improve Outcome: Progressing   Problem: Coping: Goal: Level of anxiety will decrease Outcome: Progressing   Problem: Fluid Volume: Goal: Ability to maintain a balanced intake and output will improve Outcome: Progressing

## 2022-03-30 NOTE — Evaluation (Signed)
Physical Therapy Evaluation Patient Details Name: Melanie Alvarez MRN: [id number] DOB: 04-15-1956 Today's Date: 03/30/2022  History of Present Illness  66 yo female admitted with abd pain, diarrhea. Hx of Hep C, recurrent pancreatic cancer, hepatic abscess, DM, N/V, cirrhosis, chronic hyponatremia/SIADH  Clinical Impression  On eval, pt was Mod Ind with mobility. She walked ~250 feet around the unit with use of IV pole. No LOB. No dizziness reported. Pt tolerated distance well. No acute PT needs. 1x eval. Will sign off.        Recommendations for follow up therapy are one component of a multi-disciplinary discharge planning process, led by the attending physician.  Recommendations may be updated based on patient status, additional functional criteria and insurance authorization.  Follow Up Recommendations No PT follow up      Assistance Recommended at Discharge None  Patient can return home with the following       Equipment Recommendations None recommended by PT  Recommendations for Other Services       Functional Status Assessment Patient has not had a recent decline in their functional status     Precautions / Restrictions Restrictions Weight Bearing Restrictions: No      Mobility  Bed Mobility Overal bed mobility: Modified Independent                  Transfers Overall transfer level: Modified independent                      Ambulation/Gait Ambulation/Gait assistance: Modified independent (Device/Increase time) Gait Distance (Feet): 250 Feet Assistive device: IV Pole Gait Pattern/deviations: Step-through pattern       General Gait Details: Mod Ind. No LOB. Pt stated she was planning to walk around the unit later after  Stairs            Wheelchair Mobility    Modified Rankin (Stroke Patients Only)       Balance Overall balance assessment: Modified Independent                                            Pertinent Vitals/Pain Pain Assessment Pain Assessment: Faces Faces Pain Scale: Hurts a little bit Pain Location: "I hurt all the time" Pain Intervention(s): Monitored during session    Home Living Family/patient expects to be discharged to:: Private residence Living Arrangements: Spouse/significant other Available Help at Discharge: Family;Available 24 hours/day   Home Access: Stairs to enter Entrance Stairs-Rails: Can reach both;Left;Right Entrance Stairs-Number of Steps: 4   Home Layout: One level Home Equipment: Rollator (4 wheels) Additional Comments: sister to assist at home    Prior Function Prior Level of Function : Independent/Modified Independent             Mobility Comments: walked without AD at baseline ADLs Comments: independent     Hand Dominance        Extremity/Trunk Assessment   Upper Extremity Assessment Upper Extremity Assessment: Overall WFL for tasks assessed    Lower Extremity Assessment Lower Extremity Assessment: Overall WFL for tasks assessed    Cervical / Trunk Assessment Cervical / Trunk Assessment: Normal  Communication   Communication: No difficulties  Cognition Arousal/Alertness: Awake/alert Behavior During Therapy: WFL for tasks assessed/performed Overall Cognitive Status: Within Functional Limits for tasks assessed  General Comments      Exercises     Assessment/Plan    PT Assessment Patient does not need any further PT services  PT Problem List         PT Treatment Interventions      PT Goals (Current goals can be found in the Care Plan section)  Acute Rehab PT Goals Patient Stated Goal: to get better PT Goal Formulation: All assessment and education complete, DC therapy    Frequency       Co-evaluation               AM-PAC PT "6 Clicks" Mobility  Outcome Measure Help needed turning from your back to your side while in a flat bed  without using bedrails?: None Help needed moving from lying on your back to sitting on the side of a flat bed without using bedrails?: None Help needed moving to and from a bed to a chair (including a wheelchair)?: None Help needed standing up from a chair using your arms (e.g., wheelchair or bedside chair)?: None Help needed to walk in hospital room?: None Help needed climbing 3-5 steps with a railing? : A Little 6 Click Score: 23    End of Session   Activity Tolerance: Patient tolerated treatment well Patient left: in bed;with call bell/phone within reach        Time: 1021-1033 PT Time Calculation (min) (ACUTE ONLY): 12 min   Charges:   PT Evaluation $PT Eval Low Complexity: [place], PT Acute Rehabilitation  Office: [phone] Pager: [phone]

## 2022-03-30 NOTE — Progress Notes (Signed)
Melanie Alvarez says that the diarrhea might be a little bit better.  She is still on C. difficile treatment.  She has thrush in the mouth.  I will put her on some Diflucan.  She still having some pain.  This seems to be more in the lower abdomen and not in the epigastric area.  I will increase her Duragesic patch frequency from 3 days down to 2 days exchanges.  She is out of bed.  She is not bleeding.  She has had no fevers.  She has had no labs ordered for today.  She has had no leg swelling.  She continues on the Eliquis.  Vital signs also temperature 98.  Pulse 85.  Blood pressure 132/76.  Her abdomen is soft.  Bowel sounds are still decreased.  There is noted tenderness in the left lower quadrant.  She has no fluid wave.  Lungs are clear.  Cardiac exam regular rate and rhythm.  Oral exam does show thrush.  Extremity shows no clubbing, cyanosis or edema.  Melanie Alvarez has C. Diff colitis.  She is on treatment for this.  She has a metastatic pancreatic cancer.  Ultimately, this will be her big problem.  I am glad that the diarrhea seems to be doing better.  Hopefully, diet will be increased slowly.  With her bowel sounds still quite low, I am not sure can be increased a right now.  The Diflucan should help with the thrush.  She is out of bed.  I know she is quite motivated.  Hopefully, the increased frequency of the Duragesic patch will be helpful for her.   She is doing great care from everybody on 3 E.   Lattie Haw, MD  John 1:5

## 2022-03-30 NOTE — Progress Notes (Signed)
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  Of note patient recently hospitalized at [facility] 7/17 until 8/6 for intractable nausea vomiting and abdominal pain.  Underwent endoscopy 7/21 which showed extrinsic duodenal stenosis/obstruction, she underwent duodenal stent placement 7/25 with improvement of her symptoms.  Patient was also started on Creon during last admission.  Patient presents with worsening abdominal pain, diarrhea.  CT abdomen and pelvis showed mild wall thickening in the ascending colon concerning with inflammatory or infectious colitis.  Patient was admitted for further evaluation.  Patient was found to have C. difficile colitis and norovirus.  She was a started on oral vancomycin 8/10.   Assessment & Plan:   Principal Problem:   Intractable generalized abdominal pain Active Problems:   Acute colitis   Hyponatremia   Portal vein thrombosis   Mesenteric vein thrombosis (HCC)   Malignant neoplasm of pancreas (HCC)   Type 2 diabetes mellitus with hyperglycemia, without long-term current use of insulin (HCC)   Essential hypertension   Cirrhosis of liver (HCC)   Pancreatic insufficiency   Mixed hyperlipidemia   Anemia of chronic disease   Acute diarrhea   1-Intractable abdominal pain, suspect related to Pancreatic cancer.  Patient presented with abdominal pain, epigastric diffuse, and diarrhea. CT abdomen and pelvis showed focal area of thickening in the  ascending colon may be superimposed acute colitis. Lipase unremarkable. GI pathogen Norovirus. and C. difficile Positive Continue with as needed opioids. Continue with fentanyl patch and oral Dilaudid  2-Acute colitis: C diff  Reported diarrhea, CT with focal area of thickening of the ascending colon. GI pathogen Positive  for Norovirus and C. Difficile Positive.  Afebrile, no leukocytosis, procalcitonin less than 0.10 Continue with Vancomycin flagyl. Day 2 vancomycin.  Improving.  Plan to advance diet   Hyponatremia: Multifactorial in the setting of SIADH and dehydration. She received IV fluids. Bmet pending.  Continue with sodium tablet  Mesenteric vein thrombosis: Continue with Eliquis Portal Vein thrombosis: Continue with Eliquis  Malignant neoplasm of pancreas: Dr. Marin Olp added to the treatment team Continue with fentanyl patch and oral Dilaudid  Diabetes type 2 with hyperglycemia: Sliding scale insulin  Hypertension: Continue with Norvasc  Cirrhosis  of the liver: Longstanding known history of cirrhosis, history of hepatitis C status posttreatment as well as a prior history of alcohol use.  Pancreatic insufficiency: Continue with Creon  Hyperlipidemia: Continue with Lipitor  Anemia of chronic disease: Monitor hb    Estimated body mass index is 26.6 kg/m as calculated from the following:   Height as of this encounter: 5' 3.5" (1.613 m).   Weight as of this encounter: 69.2 kg.   DVT prophylaxis: Eliquis  Code Status: Full code Family Communication: Care discussed with patient.  Disposition Plan:  Status is: Observation The patient remains OBS appropriate and will d/c before 2 midnights.    Consultants:  Dr Marin Olp  Procedures:    Antimicrobials:    Subjective: She had 3 BM yesterday. Still watery stool. Two BM today so far.  Objective: Vitals:   03/29/22 0519 03/29/22 1404 03/30/22 0500  03/30/22 0604  BP: 129/71 128/81  132/76  Pulse: 84  89  85  Resp: '19 18  18  '$ Temp: (!) 97.5 F (36.4 C) (!) 97.5 F (36.4 C)  98 F (36.7 C)  TempSrc: Oral Oral  Oral  SpO2: 97% 100%  98%  Weight:   69.2 kg   Height:        Intake/Output Summary (Last 24 hours) at 03/30/2022 1301 Last data filed at 03/30/2022 1000 Gross per 24 hour  Intake 1480 ml  Output 2 ml  Net 1478 ml    Filed Weights   03/27/22 1538 03/30/22 0500  Weight: 66.7 kg 69.2 kg    Examination:  General exam: NAD Respiratory system:CTA Cardiovascular system: S 1, S 2 RRR Gastrointestinal system: BS present, soft, nt Central nervous system: Alert Extremities: No edema   Data Reviewed: I have personally reviewed following labs and imaging studies  CBC: Recent Labs  Lab 03/24/22 0313 03/27/22 1609 03/28/22 0500 03/29/22 0453  WBC 14.2* 14.2* 9.4 9.5  NEUTROABS  --  12.5* 7.5  --   HGB 9.1* 9.4* 9.3* 9.7*  HCT 27.7* 29.4* 28.8* 29.6*  MCV 86.6 88.6 87.3 87.8  PLT 299 293 280 885    Basic Metabolic Panel: Recent Labs  Lab 03/27/22 1609 03/28/22 0152 03/28/22 0500 03/29/22 0453  NA 128* 134* 135 134*  K 3.7 3.4* 3.5 4.0  CL 98 102 102 103  CO2 '23 25 25 26  '$ GLUCOSE 219* 152* 150* 145*  BUN 12 8 7* <5*  CREATININE 0.73 0.61 0.61 0.56  CALCIUM 8.3* 8.4* 8.3* 8.5*  MG  --   --  1.7  --     GFR: Estimated Creatinine Clearance: 66.2 mL/min (by C-G formula based on SCr of 0.56 mg/dL). Liver Function Tests: Recent Labs  Lab 03/27/22 1609 03/28/22 0500  AST 23 19  ALT 24 23  ALKPHOS 71 68  BILITOT 0.6 0.6  PROT 6.5 6.4*  ALBUMIN 2.8* 2.6*    Recent Labs  Lab 03/28/22 0152  LIPASE 29    No results for input(s): "AMMONIA" in the last 168 hours. Coagulation Profile: Recent Labs  Lab 03/28/22 0500  INR 1.4*    Cardiac Enzymes: No results for input(s): "CKTOTAL", "CKMB", "CKMBINDEX", "TROPONINI" in the last 168 hours. BNP (last 3 results) No results for input(s): "PROBNP" in the last 8760 hours. HbA1C: No results for  input(s): "HGBA1C" in the last 72 hours. CBG: Recent Labs  Lab 03/29/22 1151 03/29/22 1612 03/29/22 2006 03/30/22 0744 03/30/22 1138  GLUCAP 138* 110* 215* 184* 259*    Lipid Profile: No results for input(s): "CHOL", "HDL", "LDLCALC", "TRIG", "CHOLHDL", "LDLDIRECT" in the last 72 hours. Thyroid Function Tests: No results for input(s): "TSH", "T4TOTAL", "FREET4", "T3FREE", "THYROIDAB" in the last 72 hours. Anemia Panel: No results for input(s): "VITAMINB12", "FOLATE", "FERRITIN", "TIBC", "IRON", "RETICCTPCT" in the last 72 hours. Sepsis Labs: Recent Labs  Lab 03/28/22 0152  PROCALCITON <0.10     Recent Results (from the past 240 hour(s))  C Difficile Quick Screen w PCR reflex     Status: Abnormal   Collection Time: 03/28/22 12:27 PM   Specimen: STOOL  Result Value Ref Range Status   C Diff antigen POSITIVE (A) NEGATIVE Final    Comment: RESULT CALLED TO, READ BACK BY AND VERIFIED WITH: rn p armstrong at 1537 03/28/22 cruickshank a    C Diff toxin POSITIVE (A) NEGATIVE Final    Comment: RESULT CALLED TO, READ BACK BY  AND VERIFIED WITH: rn p armstrong at 1537 03/28/22 cruickshank a    C Diff interpretation Toxin producing C. difficile detected.  Final    Comment: CRITICAL RESULT CALLED TO, READ BACK BY AND VERIFIED WITH: RN P ARMSTRONG AT 1537 03/28/22 CRUICKSHANK A Performed at [facility], [place] [address] [place], [place] [address]   Gastrointestinal Panel by PCR , Stool     Status: Abnormal   Collection Time: 03/28/22 12:27 PM   Specimen: Stool  Result Value Ref Range Status   Campylobacter species NOT DETECTED NOT DETECTED Final   Plesimonas shigelloides NOT DETECTED NOT DETECTED Final   Salmonella species NOT DETECTED NOT DETECTED Final   Yersinia enterocolitica NOT DETECTED NOT DETECTED Final   Vibrio species NOT DETECTED NOT DETECTED Final   Vibrio cholerae NOT DETECTED NOT DETECTED Final   Enteroaggregative E coli (EAEC) NOT DETECTED NOT  DETECTED Final   Enteropathogenic E coli (EPEC) NOT DETECTED NOT DETECTED Final   Enterotoxigenic E coli (ETEC) NOT DETECTED NOT DETECTED Final   Shiga like toxin producing E coli (STEC) NOT DETECTED NOT DETECTED Final   Shigella/Enteroinvasive E coli (EIEC) NOT DETECTED NOT DETECTED Final   Cryptosporidium NOT DETECTED NOT DETECTED Final   Cyclospora cayetanensis NOT DETECTED NOT DETECTED Final   Entamoeba histolytica NOT DETECTED NOT DETECTED Final   Giardia lamblia NOT DETECTED NOT DETECTED Final   Adenovirus F40/41 NOT DETECTED NOT DETECTED Final   Astrovirus NOT DETECTED NOT DETECTED Final   Norovirus GI/GII DETECTED (A) NOT DETECTED Final    Comment: RESULT CALLED TO, READ BACK BY AND VERIFIED WITH: SUSAN KUSNITZ AT 1139 03/29/22.PMF    Rotavirus A NOT DETECTED NOT DETECTED Final   Sapovirus (I, II, IV, and V) NOT DETECTED NOT DETECTED Final    Comment: Performed at [facility], [address], [place], [place] [address]         Radiology Studies: No results found.      Scheduled Meds:  (feeding supplement) PROSource Plus  30 mL Oral TID BM   amLODipine  10 mg Oral QHS   apixaban  5 mg Oral BID   atorvastatin  20 mg Oral QHS   Chlorhexidine Gluconate Cloth  6 each Topical Daily   feeding supplement  1 Container Oral TID BM   fentaNYL  2 patch Transdermal Q72H   fluconazole  100 mg Oral Daily   gabapentin  400 mg Oral TID   insulin aspart  0-15 Units Subcutaneous TID AC & HS   lipase/protease/amylase  72,000 Units Oral TID AC   multivitamin with minerals  1 tablet Oral Daily   nystatin  5 mL Oral QID   pantoprazole (PROTONIX) IV  40 mg Intravenous Q24H   sodium chloride flush  3 mL Intravenous Q12H   sodium chloride  1 g Oral BID WC   vancomycin  125 mg Oral QID   Continuous Infusions:  metronidazole 500 mg (03/30/22 0604)     LOS: 2 days    Time spent: 87 minutes/     Melanie Diop A Zena Vitelli, MD [facility]   If 7PM-7AM, please contact  night-coverage [email or web address]  03/30/2022, 1:01 PM

## 2022-03-31 DIAGNOSIS — R1084 Generalized abdominal pain: Secondary | ICD-10-CM | POA: Diagnosis not present

## 2022-03-31 LAB — BASIC METABOLIC PANEL
Anion gap: 7 (ref 5–15)
BUN: <5 mg/dL — ABNORMAL LOW (ref 8–23)
CO2: 22 mmol/L (ref 22–32)
Calcium: 8.3 mg/dL — ABNORMAL LOW (ref 8.9–10.3)
Chloride: 103 mmol/L (ref 98–111)
Creatinine, Ser: 0.6 mg/dL (ref 0.44–1.00)
GFR, Estimated: >60 mL/min (ref >60)
Glucose, Bld: 255 mg/dL — ABNORMAL HIGH (ref 70–99)
Potassium: 3.2 mmol/L — ABNORMAL LOW (ref 3.5–5.1)
Sodium: 132 mmol/L — ABNORMAL LOW (ref 135–145)

## 2022-03-31 LAB — GLUCOSE, CAPILLARY
Glucose-Capillary: 136 mg/dL — ABNORMAL HIGH (ref 70–99)
Glucose-Capillary: 237 mg/dL — ABNORMAL HIGH (ref 70–99)
Glucose-Capillary: 141 mg/dL — ABNORMAL HIGH (ref 70–99)
Glucose-Capillary: 214 mg/dL — ABNORMAL HIGH (ref 70–99)

## 2022-03-31 MED ORDER — POTASSIUM CHLORIDE CRYS ER 20 MEQ PO TBCR
40.0000 meq | EXTENDED_RELEASE_TABLET | ORAL | Status: AC
  Administered 2022-03-31 (×2): 40 meq via ORAL
  Filled 2022-03-31 (×2): qty 2

## 2022-03-31 MED ORDER — SACCHAROMYCES BOULARDII 250 MG PO CAPS
250.0000 mg | ORAL_CAPSULE | Freq: Two times a day (BID) | ORAL | Status: DC
  Administered 2022-03-31 – 2022-04-05 (×11): 250 mg via ORAL
  Filled 2022-03-31 (×11): qty 1

## 2022-03-31 NOTE — Progress Notes (Addendum)
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  Of note patient recently hospitalized at [facility] 7/17 until 8/6 for intractable nausea vomiting and abdominal pain.  Underwent endoscopy 7/21 which showed extrinsic duodenal stenosis/obstruction, she underwent duodenal stent placement 7/25 with improvement of her symptoms.  Patient was also started on Creon during last admission.  Patient presents with worsening abdominal pain, diarrhea.  CT abdomen and pelvis showed mild wall thickening in the ascending colon concerning with inflammatory or infectious colitis.  Patient was admitted for further evaluation.  Patient was found to have C. difficile colitis and norovirus.  She was a started on oral vancomycin 8/10.   Assessment & Plan:   Principal Problem:   Intractable generalized abdominal pain Active Problems:   Acute colitis   Hyponatremia   Portal vein thrombosis   Mesenteric vein thrombosis (HCC)   Malignant neoplasm of pancreas (HCC)   Type 2 diabetes mellitus with hyperglycemia, without long-term current use of insulin (HCC)   Essential hypertension   Cirrhosis of liver (HCC)   Pancreatic insufficiency   Mixed hyperlipidemia   Anemia of chronic disease   Acute diarrhea   1-Intractable abdominal pain, suspect related to Pancreatic cancer.  Patient presented with abdominal pain, epigastric diffuse, and diarrhea. CT abdomen and pelvis showed focal area of thickening in the  ascending colon may be superimposed acute colitis. Lipase unremarkable. GI pathogen Norovirus. and C. difficile Positive Continue with as needed opioids. Continue with fentanyl patch and oral Dilaudid Pain stable.   2-Acute colitis: C diff  Reported diarrhea, CT with focal area of thickening of the ascending colon. GI pathogen Positive  for Norovirus and C. Difficile Positive.  Afebrile, no leukocytosis, procalcitonin less than 0.10 Continue with Vancomycin flagyl. Day 3 vancomycin.  Diarrhea less frequent but still watery  On heart healthy diet.  Start florastore.    Hyponatremia: Multifactorial in the setting of SIADH and dehydration. She received IV fluids. Bmet pending.  Continue with sodium tablet  Mesenteric vein thrombosis: Continue with Eliquis Portal Vein thrombosis: Continue with Eliquis  Malignant neoplasm of pancreas: Dr. Marin Olp added to the treatment team Continue with fentanyl patch and oral Dilaudid  Diabetes type 2 with hyperglycemia: Sliding scale insulin  Hypertension: Continue with Norvasc  Cirrhosis  of the liver: Longstanding known history of cirrhosis, history of hepatitis C status posttreatment as well as a prior history of alcohol use.  Pancreatic insufficiency: Continue with Creon  Hyperlipidemia: Continue with Lipitor  Anemia of chronic disease: Monitor hb  Hypokalemia; replete orally.   Estimated body mass index is 26.95 kg/m as calculated from the following:   Height as of this encounter: 5' 3.5" (1.613 m).   Weight as of this encounter: 70.1 kg.   DVT prophylaxis: Eliquis  Code Status: Full code Family Communication: Care discussed with patient.  Disposition Plan:  Status is: Observation The patient remains OBS appropriate and will d/c before 2 midnights.    Consultants:  Dr Marin Olp  Procedures:    Antimicrobials:    Subjective: She report less frequent BM. Stool still watery  Objective: Vitals:   03/30/22 0604  03/30/22 1304 03/30/22 2125 03/31/22 0500  BP: 132/76 120/73 138/86   Pulse: 85 91 95   Resp: '18 16 18   '$ Temp: 98 F (36.7 C) 98.5 F (36.9 C) (!) 97.4 F (36.3 C)   TempSrc: Oral Oral Oral   SpO2: 98% 100% 100%   Weight:    70.1 kg  Height:        Intake/Output Summary (Last 24 hours) at 03/31/2022 1230 Last data filed at 03/31/2022 1000 Gross per 24 hour  Intake 1297.22 ml  Output 1000 ml  Net 297.22 ml    Filed Weights   03/27/22 1538 03/30/22 0500 03/31/22 0500  Weight: 66.7 kg 69.2 kg 70.1 kg    Examination:  General exam: NAD Respiratory system: CTA Cardiovascular system: S 1,  2 RRR Gastrointestinal system: BS present, soft, nt Central nervous system: Alert Extremities: No edema   Data Reviewed: I have personally reviewed following labs and imaging studies  CBC: Recent Labs  Lab 03/27/22 1609 03/28/22 0500 03/29/22 0453 03/30/22 1800  WBC 14.2* 9.4 9.5 8.1  NEUTROABS 12.5* 7.5  --   --   HGB 9.4* 9.3* 9.7* 9.2*  HCT 29.4* 28.8* 29.6* 28.3*  MCV 88.6 87.3 87.8 88.7  PLT 293 280 300 786    Basic Metabolic Panel: Recent Labs  Lab 03/27/22 1609 03/28/22 0152 03/28/22 0500 03/29/22 0453 03/30/22 1800  NA 128* 134* 135 134* 133*  K 3.7 3.4* 3.5 4.0 3.3*  CL 98 102 102 103 104  CO2 '23 25 25 26 24  '$ GLUCOSE 219* 152* 150* 145* 159*  BUN 12 8 7* <5* 6*  CREATININE 0.73 0.61 0.61 0.56 0.51  CALCIUM 8.3* 8.4* 8.3* 8.5* 8.4*  MG  --   --  1.7  --   --     GFR: Estimated Creatinine Clearance: 66.6 mL/min (by C-G formula based on SCr of 0.51 mg/dL). Liver Function Tests: Recent Labs  Lab 03/27/22 1609 03/28/22 0500  AST 23 19  ALT 24 23  ALKPHOS 71 68  BILITOT 0.6 0.6  PROT 6.5 6.4*  ALBUMIN 2.8* 2.6*    Recent Labs  Lab 03/28/22 0152  LIPASE 29    No results for input(s): "AMMONIA" in the last 168 hours. Coagulation Profile: Recent Labs  Lab 03/28/22 0500  INR 1.4*    Cardiac Enzymes: No results for input(s): "CKTOTAL",  "CKMB", "CKMBINDEX", "TROPONINI" in the last 168 hours. BNP (last 3 results) No results for input(s): "PROBNP" in the last 8760 hours. HbA1C: No results for input(s): "HGBA1C" in the last 72 hours. CBG: Recent Labs  Lab 03/30/22 1138 03/30/22 1642 03/30/22 2127 03/31/22 0738 03/31/22 1154  GLUCAP 259* 166* 126* 136* 237*    Lipid Profile: No results for input(s): "CHOL", "HDL", "LDLCALC", "TRIG", "CHOLHDL", "LDLDIRECT" in the last 72 hours. Thyroid Function Tests: No results for input(s): "TSH", "T4TOTAL", "FREET4", "T3FREE", "THYROIDAB" in the last 72 hours. Anemia Panel: No results for input(s): "VITAMINB12", "FOLATE", "FERRITIN", "TIBC", "IRON", "RETICCTPCT" in the last 72 hours. Sepsis Labs: Recent Labs  Lab 03/28/22 0152  PROCALCITON <0.10     Recent Results (from the past 240 hour(s))  C Difficile Quick Screen w PCR reflex     Status: Abnormal   Collection Time: 03/28/22 12:27 PM   Specimen: STOOL  Result Value Ref Range Status   C Diff antigen POSITIVE (A) NEGATIVE Final    Comment: RESULT CALLED TO, READ BACK BY AND VERIFIED WITH: rn  p armstrong at 1537 03/28/22 cruickshank a    C Diff toxin POSITIVE (A) NEGATIVE Final    Comment: RESULT CALLED TO, READ BACK BY AND VERIFIED WITH: rn p armstrong at 1537 03/28/22 cruickshank a    C Diff interpretation Toxin producing C. difficile detected.  Final    Comment: CRITICAL RESULT CALLED TO, READ BACK BY AND VERIFIED WITH: RN P ARMSTRONG AT 1537 03/28/22 CRUICKSHANK A Performed at [facility], [place] [address] [place], [place] [address]   Gastrointestinal Panel by PCR , Stool     Status: Abnormal   Collection Time: 03/28/22 12:27 PM   Specimen: Stool  Result Value Ref Range Status   Campylobacter species NOT DETECTED NOT DETECTED Final   Plesimonas shigelloides NOT DETECTED NOT DETECTED Final   Salmonella species NOT DETECTED NOT DETECTED Final   Yersinia enterocolitica NOT DETECTED NOT DETECTED  Final   Vibrio species NOT DETECTED NOT DETECTED Final   Vibrio cholerae NOT DETECTED NOT DETECTED Final   Enteroaggregative E coli (EAEC) NOT DETECTED NOT DETECTED Final   Enteropathogenic E coli (EPEC) NOT DETECTED NOT DETECTED Final   Enterotoxigenic E coli (ETEC) NOT DETECTED NOT DETECTED Final   Shiga like toxin producing E coli (STEC) NOT DETECTED NOT DETECTED Final   Shigella/Enteroinvasive E coli (EIEC) NOT DETECTED NOT DETECTED Final   Cryptosporidium NOT DETECTED NOT DETECTED Final   Cyclospora cayetanensis NOT DETECTED NOT DETECTED Final   Entamoeba histolytica NOT DETECTED NOT DETECTED Final   Giardia lamblia NOT DETECTED NOT DETECTED Final   Adenovirus F40/41 NOT DETECTED NOT DETECTED Final   Astrovirus NOT DETECTED NOT DETECTED Final   Norovirus GI/GII DETECTED (A) NOT DETECTED Final    Comment: RESULT CALLED TO, READ BACK BY AND VERIFIED WITH: SUSAN KUSNITZ AT 1139 03/29/22.PMF    Rotavirus A NOT DETECTED NOT DETECTED Final   Sapovirus (I, II, IV, and V) NOT DETECTED NOT DETECTED Final    Comment: Performed at [facility], [address] [place], [place] [address]         Radiology Studies: No results found.      Scheduled Meds:  (feeding supplement) PROSource Plus  30 mL Oral TID BM   amLODipine  10 mg Oral QHS   apixaban  5 mg Oral BID   atorvastatin  20 mg Oral QHS   Chlorhexidine Gluconate Cloth  6 each Topical Daily   feeding supplement  1 Container Oral TID BM   fentaNYL  2 patch Transdermal Q72H   fluconazole  100 mg Oral Daily   gabapentin  400 mg Oral TID   insulin aspart  0-15 Units Subcutaneous TID AC & HS   lipase/protease/amylase  72,000 Units Oral TID AC   multivitamin with minerals  1 tablet Oral Daily   nystatin  5 mL Oral QID   saccharomyces boulardii  250 mg Oral BID   sodium chloride flush  3 mL Intravenous Q12H   sodium chloride  1 g Oral BID WC   vancomycin  125 mg Oral QID   Continuous Infusions:  metronidazole 500  mg (03/31/22 0523)     LOS: 3 days    Time spent: 35 minutes/     Si Jachim A Manpreet Kemmer, MD [facility]   If 7PM-7AM, please contact night-coverage [email or web address]  03/31/2022, 12:30 PM

## 2022-04-01 DIAGNOSIS — A0472 Enterocolitis due to Clostridium difficile, not specified as recurrent: Secondary | ICD-10-CM

## 2022-04-01 DIAGNOSIS — R1084 Generalized abdominal pain: Secondary | ICD-10-CM | POA: Diagnosis not present

## 2022-04-01 LAB — BASIC METABOLIC PANEL
Anion gap: 5 (ref 5–15)
BUN: 6 mg/dL — ABNORMAL LOW (ref 8–23)
CO2: 25 mmol/L (ref 22–32)
Calcium: 8.4 mg/dL — ABNORMAL LOW (ref 8.9–10.3)
Chloride: 105 mmol/L (ref 98–111)
Creatinine, Ser: 0.5 mg/dL (ref 0.44–1.00)
GFR, Estimated: >60 mL/min (ref >60)
Glucose, Bld: 110 mg/dL — ABNORMAL HIGH (ref 70–99)
Potassium: 4.2 mmol/L (ref 3.5–5.1)
Sodium: 135 mmol/L (ref 135–145)

## 2022-04-01 LAB — CBC
HCT: 28.2 % — ABNORMAL LOW (ref 36.0–46.0)
Hemoglobin: 9 g/dL — ABNORMAL LOW (ref 12.0–15.0)
MCH: 28.4 pg (ref 26.0–34.0)
MCHC: 31.9 g/dL (ref 30.0–36.0)
MCV: 89 fL (ref 80.0–100.0)
Platelets: 296 10*3/uL (ref 150–400)
RBC: 3.17 MIL/uL — ABNORMAL LOW (ref 3.87–5.11)
RDW: 15.5 % (ref 11.5–15.5)
WBC: 8.8 10*3/uL (ref 4.0–10.5)
nRBC: 0 % (ref 0.0–0.2)

## 2022-04-01 LAB — GLUCOSE, CAPILLARY
Glucose-Capillary: 119 mg/dL — ABNORMAL HIGH (ref 70–99)
Glucose-Capillary: 244 mg/dL — ABNORMAL HIGH (ref 70–99)
Glucose-Capillary: 167 mg/dL — ABNORMAL HIGH (ref 70–99)
Glucose-Capillary: 157 mg/dL — ABNORMAL HIGH (ref 70–99)

## 2022-04-01 LAB — MAGNESIUM: Magnesium: 1.8 mg/dL (ref 1.7–2.4)

## 2022-04-01 MED ORDER — DIPHENHYDRAMINE-ZINC ACETATE 2-0.1 % EX CREA
TOPICAL_CREAM | Freq: Two times a day (BID) | CUTANEOUS | Status: DC | PRN
  Administered 2022-04-01: 1 via TOPICAL
  Filled 2022-04-01: qty 28

## 2022-04-01 MED ORDER — FENTANYL 25 MCG/HR TD PT72
2.0000 | MEDICATED_PATCH | TRANSDERMAL | Status: DC
  Administered 2022-04-01 – 2022-04-03 (×2): 2 via TRANSDERMAL
  Filled 2022-04-01 (×2): qty 2

## 2022-04-01 NOTE — Progress Notes (Signed)
Seems like the diarrhea is getting a little bit better.  She still has some abdominal pain.  However, the pain seems to be more so in the lower abdomen and not in the upper abdomen.  Her diet is being advanced slowly.  She has both C. difficile and norovirus.  Her labs today show worsening of 8.8.  Hemoglobin 9.  Platelet count 296,000.  Her BUN is 6 creatinine 0.5.  Magnesium 1.8.  She has had no fever.  There has been no bleeding.  She has had no cough or shortness of breath.  She is out of bed.  Her vital signs are temperature 98.4.  Pulse 86.  Blood pressure 121/72.  Her abdomen is soft.  Bowel sounds are better.  There is no fluid wave.  There is no distention.  There is still some tenderness in the lower abdomen.  Lungs are clear.  Cardiac exam regular rate and rhythm.  Hopefully, she will be able to continue to eat better.  She is on a Heart Healthy diet right now.  She is a little anemic.  I really not surprised by this given the lab work.  She does not need any transfusions right now.  We will continue to follow along.  There is nothing that we need to do since this is nothing related to her malignancy.   Lattie Haw, MD  Proverbs 17:17

## 2022-04-01 NOTE — Progress Notes (Signed)
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  Of note patient recently hospitalized at [facility] 7/17 until 8/6 for intractable nausea vomiting and abdominal pain.  Underwent endoscopy 7/21 which showed extrinsic duodenal stenosis/obstruction, she underwent duodenal stent placement 7/25 with improvement of her symptoms.  Patient was also started on Creon during last admission.  Patient presents with worsening abdominal pain, diarrhea.  CT abdomen and pelvis showed mild wall thickening in the ascending colon concerning with inflammatory or infectious colitis.  Patient was admitted for further evaluation.  Patient was found to have C. difficile colitis and norovirus.  She was a started on oral vancomycin 8/10.   Assessment & Plan:   Principal Problem:   C. difficile colitis Active Problems:   Intractable generalized abdominal pain   Acute colitis   Hyponatremia   Portal vein thrombosis   Mesenteric vein thrombosis (HCC)   Malignant neoplasm of pancreas (HCC)   Type 2 diabetes mellitus with hyperglycemia, without long-term current use of insulin (HCC)   Essential hypertension   Cirrhosis of liver (HCC)   Pancreatic insufficiency   Mixed hyperlipidemia   Anemia of chronic disease   Acute diarrhea   1-Intractable abdominal pain, suspect related to Pancreatic cancer.  Patient presented with abdominal pain, epigastric diffuse, and diarrhea. CT abdomen and pelvis showed focal area  of thickening in the ascending colon may be superimposed acute colitis. Lipase unremarkable. GI pathogen Norovirus. and C. difficile Positive Continue with as needed opioids. Continue with fentanyl patch and oral Dilaudid Pain stable.   2-Acute colitis: C diff  Reported diarrhea, CT with focal area of thickening of the ascending colon. GI pathogen Positive  for Norovirus and C. Difficile Positive.  Afebrile, no leukocytosis, procalcitonin less than 0.10 Continue with Vancomycin,   Day 4 .  Received 3 days of flagyl.  On heart healthy diet.  Started  florastore.  Improving , had one watery stool yesterday.  Hopefully home tomorrow.   Hyponatremia: Multifactorial in the setting of SIADH and dehydration. She received IV fluids. Sodium stable.  Continue with sodium tablet  Mesenteric vein thrombosis: Continue with Eliquis Portal Vein thrombosis: Continue with Eliquis  Malignant neoplasm of pancreas: Dr. Marin Olp added to the treatment team Continue with fentanyl patch and oral Dilaudid Fentanyl patch increase to 25 mcg  Diabetes type 2 with hyperglycemia: Sliding scale insulin Resume metformin and glipizide at discharge   Hypertension: Continue with Norvasc  Cirrhosis  of the liver: Longstanding known history of cirrhosis, history of hepatitis C status posttreatment as well as a prior history of alcohol use.  Pancreatic insufficiency: Continue with Creon  Hyperlipidemia: Continue with Lipitor  Anemia of chronic disease: Monitor hb  Hypokalemia; Replaced.   Estimated body mass index is 28.41 kg/m as calculated from the following:   Height as of this encounter: 5' 3.5" (1.613 m).   Weight as of this encounter: 73.9 kg.   DVT prophylaxis: Eliquis  Code Status: Full code Family Communication: Care discussed with patient.  Disposition Plan:  Status is: Observation The patient remains OBS appropriate and will d/c  before 2 midnights.    Consultants:  Dr  Marin Olp  Procedures:    Antimicrobials:    Subjective: She report abdominal pain is same, stable.  She had one watery BM yesterday. She feels she will have one soon.   Objective: Vitals:   04/01/22 0137 04/01/22 0500 04/01/22 0644 04/01/22 1302  BP: 121/72  122/83 133/80  Pulse: 86  88 92  Resp: '18  18 17  '$ Temp: 98.4 F (36.9 C)  97.8 F (36.6 C) 98 F (36.7 C)  TempSrc: Oral  Oral Oral  SpO2: 100%  100% 100%  Weight:  73.9 kg    Height:        Intake/Output Summary (Last 24 hours) at 04/01/2022 1333 Last data filed at 04/01/2022 1000 Gross per 24 hour  Intake 640 ml  Output 1 ml  Net 639 ml   Filed Weights   03/30/22 0500 03/31/22 0500 04/01/22 0500  Weight: 69.2 kg 70.1 kg 73.9 kg    Examination:  General exam: NAD Respiratory system: CTA Cardiovascular system: S 1, S 2 RRR Gastrointestinal system: BS present, soft,  Central nervous system:alert, follows command Extremities: No edema   Data Reviewed: I have personally reviewed following labs and imaging studies  CBC: Recent Labs  Lab 03/27/22 1609 03/28/22 0500 03/29/22 0453 03/30/22 1800 04/01/22 0319  WBC 14.2* 9.4 9.5 8.1 8.8  NEUTROABS 12.5* 7.5  --   --   --   HGB 9.4* 9.3* 9.7* 9.2* 9.0*  HCT 29.4* 28.8* 29.6* 28.3* 28.2*  MCV 88.6 87.3 87.8 88.7 89.0  PLT 293 280 300 300 657   Basic Metabolic Panel: Recent Labs  Lab 03/28/22 0500 03/29/22 0453 03/30/22 1800 03/31/22 0955 04/01/22 0319  NA 135 134* 133* 132* 135  K 3.5 4.0 3.3* 3.2* 4.2  CL 102 103 104 103 105  CO2 '25 26 24 22 25  '$ GLUCOSE 150* 145* 159* 255* 110*  BUN 7* <5* 6* <5* 6*  CREATININE 0.61 0.56 0.51 0.60 0.50  CALCIUM 8.3* 8.5* 8.4* 8.3* 8.4*  MG 1.7  --   --   --  1.8   GFR: Estimated Creatinine Clearance: 68.3 mL/min (by C-G formula based on SCr of 0.5 mg/dL). Liver Function Tests: Recent Labs  Lab 03/27/22 1609 03/28/22 0500  AST 23 19  ALT 24 23  ALKPHOS 71 68  BILITOT 0.6 0.6  PROT 6.5 6.4*   ALBUMIN 2.8* 2.6*   Recent Labs  Lab 03/28/22 0152  LIPASE 29   No results for input(s): "AMMONIA" in the last 168 hours. Coagulation Profile: Recent Labs  Lab 03/28/22 0500  INR 1.4*   Cardiac Enzymes: No results for input(s): "CKTOTAL", "CKMB", "CKMBINDEX", "TROPONINI" in the last 168 hours. BNP (last 3 results) No results for input(s): "PROBNP" in the last 8760 hours. HbA1C: No results for input(s): "HGBA1C" in the last 72 hours. CBG: Recent Labs  Lab 03/31/22 1154 03/31/22 1619 03/31/22 2208 04/01/22 0729 04/01/22 1158  GLUCAP 237* 141* 214* 119* 244*   Lipid Profile: No results for input(s): "CHOL", "HDL", "LDLCALC", "TRIG", "CHOLHDL", "LDLDIRECT" in the last 72 hours. Thyroid Function Tests: No results for input(s): "TSH", "T4TOTAL", "FREET4", "T3FREE", "THYROIDAB" in the last 72 hours. Anemia Panel: No results for input(s): "VITAMINB12", "FOLATE", "FERRITIN", "TIBC", "IRON", "RETICCTPCT" in the last 72 hours. Sepsis Labs: Recent Labs  Lab 03/28/22 0152  PROCALCITON <0.10    Recent Results (from the past 240 hour(s))  C Difficile Quick Screen w PCR reflex  Status: Abnormal   Collection Time: 03/28/22 12:27 PM   Specimen: STOOL  Result Value Ref Range Status   C Diff antigen POSITIVE (A) NEGATIVE Final    Comment: RESULT CALLED TO, READ BACK BY AND VERIFIED WITH: rn p armstrong at 1537 03/28/22 cruickshank a    C Diff toxin POSITIVE (A) NEGATIVE Final    Comment: RESULT CALLED TO, READ BACK BY AND VERIFIED WITH: rn p armstrong at 1537 03/28/22 cruickshank a    C Diff interpretation Toxin producing C. difficile detected.  Final    Comment: CRITICAL RESULT CALLED TO, READ BACK BY AND VERIFIED WITH: RN P ARMSTRONG AT 1537 03/28/22 CRUICKSHANK A Performed at [facility], [place] [address] [place], [place] [address]   Gastrointestinal Panel by PCR , Stool     Status: Abnormal   Collection Time: 03/28/22 12:27 PM   Specimen: Stool   Result Value Ref Range Status   Campylobacter species NOT DETECTED NOT DETECTED Final   Plesimonas shigelloides NOT DETECTED NOT DETECTED Final   Salmonella species NOT DETECTED NOT DETECTED Final   Yersinia enterocolitica NOT DETECTED NOT DETECTED Final   Vibrio species NOT DETECTED NOT DETECTED Final   Vibrio cholerae NOT DETECTED NOT DETECTED Final   Enteroaggregative E coli (EAEC) NOT DETECTED NOT DETECTED Final   Enteropathogenic E coli (EPEC) NOT DETECTED NOT DETECTED Final   Enterotoxigenic E coli (ETEC) NOT DETECTED NOT DETECTED Final   Shiga like toxin producing E coli (STEC) NOT DETECTED NOT DETECTED Final   Shigella/Enteroinvasive E coli (EIEC) NOT DETECTED NOT DETECTED Final   Cryptosporidium NOT DETECTED NOT DETECTED Final   Cyclospora cayetanensis NOT DETECTED NOT DETECTED Final   Entamoeba histolytica NOT DETECTED NOT DETECTED Final   Giardia lamblia NOT DETECTED NOT DETECTED Final   Adenovirus F40/41 NOT DETECTED NOT DETECTED Final   Astrovirus NOT DETECTED NOT DETECTED Final   Norovirus GI/GII DETECTED (A) NOT DETECTED Final    Comment: RESULT CALLED TO, READ BACK BY AND VERIFIED WITH: SUSAN KUSNITZ AT 1139 03/29/22.PMF    Rotavirus A NOT DETECTED NOT DETECTED Final   Sapovirus (I, II, IV, and V) NOT DETECTED NOT DETECTED Final    Comment: Performed at [facility], [address], [place], [place] [address]         Radiology Studies: No results found.      Scheduled Meds:  (feeding supplement) PROSource Plus  30 mL Oral TID BM   amLODipine  10 mg Oral QHS   apixaban  5 mg Oral BID   atorvastatin  20 mg Oral QHS   Chlorhexidine Gluconate Cloth  6 each Topical Daily   feeding supplement  1 Container Oral TID BM   fentaNYL  2 patch Transdermal Q48H   fluconazole  100 mg Oral Daily   gabapentin  400 mg Oral TID   insulin aspart  0-15 Units Subcutaneous TID AC & HS   lipase/protease/amylase  72,000 Units Oral TID AC   multivitamin with  minerals  1 tablet Oral Daily   nystatin  5 mL Oral QID   saccharomyces boulardii  250 mg Oral BID   sodium chloride flush  3 mL Intravenous Q12H   sodium chloride  1 g Oral BID WC   vancomycin  125 mg Oral QID   Continuous Infusions:    LOS: 4 days    Time spent: 35 minutes/     Alenah Sarria A Andrew Blasius, MD [facility]   If 7PM-7AM, please contact night-coverage  [email or web address]  04/01/2022, 1:33 PM

## 2022-04-02 ENCOUNTER — Inpatient Hospital Stay: Payer: BC Managed Care – PPO

## 2022-04-02 ENCOUNTER — Inpatient Hospital Stay: Payer: BC Managed Care – PPO | Admitting: Hematology & Oncology

## 2022-04-02 DIAGNOSIS — A0472 Enterocolitis due to Clostridium difficile, not specified as recurrent: Secondary | ICD-10-CM | POA: Diagnosis not present

## 2022-04-02 LAB — BASIC METABOLIC PANEL
Anion gap: 4 — ABNORMAL LOW (ref 5–15)
BUN: 7 mg/dL — ABNORMAL LOW (ref 8–23)
CO2: 27 mmol/L (ref 22–32)
Calcium: 8.2 mg/dL — ABNORMAL LOW (ref 8.9–10.3)
Chloride: 103 mmol/L (ref 98–111)
Creatinine, Ser: 0.5 mg/dL (ref 0.44–1.00)
GFR, Estimated: >60 mL/min (ref >60)
Glucose, Bld: 172 mg/dL — ABNORMAL HIGH (ref 70–99)
Potassium: 4.2 mmol/L (ref 3.5–5.1)
Sodium: 134 mmol/L — ABNORMAL LOW (ref 135–145)

## 2022-04-02 LAB — GLUCOSE, CAPILLARY
Glucose-Capillary: 135 mg/dL — ABNORMAL HIGH (ref 70–99)
Glucose-Capillary: 203 mg/dL — ABNORMAL HIGH (ref 70–99)
Glucose-Capillary: 131 mg/dL — ABNORMAL HIGH (ref 70–99)
Glucose-Capillary: 171 mg/dL — ABNORMAL HIGH (ref 70–99)

## 2022-04-02 LAB — CBC
HCT: 28.8 % — ABNORMAL LOW (ref 36.0–46.0)
Hemoglobin: 9.1 g/dL — ABNORMAL LOW (ref 12.0–15.0)
MCH: 28.1 pg (ref 26.0–34.0)
MCHC: 31.6 g/dL (ref 30.0–36.0)
MCV: 88.9 fL (ref 80.0–100.0)
Platelets: 306 10*3/uL (ref 150–400)
RBC: 3.24 MIL/uL — ABNORMAL LOW (ref 3.87–5.11)
RDW: 15.5 % (ref 11.5–15.5)
WBC: 8.9 10*3/uL (ref 4.0–10.5)
nRBC: 0 % (ref 0.0–0.2)

## 2022-04-02 MED ORDER — METRONIDAZOLE 500 MG/100ML IV SOLN
500.0000 mg | Freq: Three times a day (TID) | INTRAVENOUS | Status: DC
  Administered 2022-04-02 – 2022-04-05 (×10): 500 mg via INTRAVENOUS
  Filled 2022-04-02 (×10): qty 100

## 2022-04-02 NOTE — Progress Notes (Signed)
She still having some diarrhea.  This seems to be a little bit better.  She still has the C. difficile in the norovirus.  Patient is still having some abdominal pain.  I am unsure if this is from the colitis from this is from the malignancy.  She is having no problems with nausea or vomiting.  She is taking an some regular food.  She is on a heart healthy diet right now.  There are no labs back yet today.  She has had no fever.  There has been no bleeding.  I still think that the recurrent pancreatic cancer is a problem.  Again, she is not that interested in having any treatment right now.  Vital signs show temperature of 97.9.  Pulse 98.  Blood pressure 143/85.  Lungs are clear bilaterally.  Cardiac exam regular rate and rhythm.  She has a normal S1 and S2.  Abdomen is soft.  Bowel sounds are present.  There is a little bit of tenderness to palpation in the mid abdomen.  There is no fluid wave.  Extremity shows no clubbing, cyanosis or edema.  Again, this is colitis.  This is from the C. difficile and the norovirus.  I told her I will take 3-4 weeks before things finally start to heal up.  Hopefully, she will still be able to eat and not have vomiting.  She has some nausea which I am sure is from the cancer pressing on the small bowel.  Maybe, she will be a little more active today.  I do appreciate the incredible care she is getting from the wonderful staff on [address].  Lattie Haw, MD  [place] 14:14

## 2022-04-02 NOTE — Progress Notes (Signed)
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  Of note patient recently hospitalized at [facility] 7/17 until 8/6 for intractable nausea vomiting and abdominal pain.  Underwent endoscopy 7/21 which showed extrinsic duodenal stenosis/obstruction, she underwent duodenal stent placement 7/25 with improvement of her symptoms.  Patient was also started on Creon during last admission.  Patient presents with worsening abdominal pain, diarrhea.  CT abdomen and pelvis showed mild wall thickening in the ascending colon concerning with inflammatory or infectious colitis.  Patient was admitted for further evaluation.  Patient was found to have C. difficile colitis and norovirus.  She was a started on oral vancomycin 8/10.   Assessment & Plan:   Principal Problem:   C. difficile colitis Active Problems:   Intractable generalized abdominal pain   Acute colitis   Hyponatremia   Portal vein thrombosis   Mesenteric vein thrombosis (HCC)   Malignant neoplasm of pancreas (HCC)   Type 2 diabetes mellitus with hyperglycemia, without long-term current use of insulin (HCC)   Essential hypertension   Cirrhosis of liver (HCC)   Pancreatic insufficiency   Mixed hyperlipidemia   Anemia of chronic disease   Acute diarrhea   1-Intractable abdominal pain, suspect related to Pancreatic cancer.  Patient presented with abdominal pain, epigastric diffuse, and diarrhea. CT abdomen and pelvis showed focal area  of thickening in the ascending colon may be superimposed acute colitis. Lipase unremarkable. GI pathogen Norovirus. and C. difficile Positive Continue with as needed opioids. Continue with fentanyl patch and oral Dilaudid Pain stable.   2-Acute colitis: C diff  Reported diarrhea, CT with focal area of thickening of the ascending colon. GI pathogen Positive  for Norovirus and C. Difficile Positive.  Afebrile, no leukocytosis, procalcitonin less than 0.10 Continue with Vancomycin,   Day 5 .  Received 3 days of flagyl. Will resume flagyl she reported 6 BM yesterday. Flagyl 8/15. On heart healthy diet.  Started  florastore.  She reported 6 BM yesterday, 2 today so far. Plan to resume flagyl.    Hyponatremia: Multifactorial in the setting of SIADH and dehydration. She received IV fluids. Sodium stable.  Continue with sodium tablet Needs to be carefull with IV fluids due to SIADH/.  Labs pending for today.   Mesenteric vein thrombosis: Continue with Eliquis Portal Vein thrombosis: Continue with Eliquis  Malignant neoplasm of pancreas: Dr. Marin Olp added to the treatment team Continue with fentanyl patch and oral Dilaudid Fentanyl patch increase to 25 mcg  Diabetes type 2 with hyperglycemia: Sliding scale insulin Resume metformin and glipizide at discharge   Hypertension: Continue with Norvasc  Cirrhosis  of the liver: Longstanding known history of cirrhosis, history of hepatitis C status posttreatment as well as a prior history of alcohol use.  Pancreatic insufficiency: Continue with Creon  Hyperlipidemia: Continue with Lipitor  Anemia of chronic disease: Monitor hb  Hypokalemia; Replaced.   Estimated body mass index is 29.14 kg/m as calculated from the following:   Height as of this encounter: 5' 3.5" (1.613 m).   Weight as of this encounter: 75.8 kg.   DVT  prophylaxis: Eliquis  Code Status: Full code Family Communication: Care discussed with patient.   Disposition Plan:  Status is: inpatient.  The patient will require care spanning > 2 midnights and should be moved to inpatient because: management of C diff    Consultants:  Dr Marin Olp  Procedures:    Antimicrobials:    Subjective: She relates 6 BM yesterday, with more consistency. ;less loose.  Objective: Vitals:   04/01/22 1302 04/01/22 2134 04/02/22 0500 04/02/22 0602  BP: 133/80 121/68  (!) 143/85  Pulse: 92 92  98  Resp: '17 18  20  '$ Temp: 98 F (36.7 C) 98.2 F (36.8 C)  97.9 F (36.6 C)  TempSrc: Oral Oral  Oral  SpO2: 100% 100%  98%  Weight:   75.8 kg   Height:        Intake/Output Summary (Last 24 hours) at 04/02/2022 0919 Last data filed at 04/02/2022 6967 Gross per 24 hour  Intake 1320 ml  Output 1801 ml  Net -481 ml    Filed Weights   03/31/22 0500 04/01/22 0500 04/02/22 0500  Weight: 70.1 kg 73.9 kg 75.8 kg    Examination:  General exam: NAD Respiratory system: CTA Cardiovascular system: S 1, S 2  Gastrointestinal system: BS present, soft, nt Central nervous system: Alert, follows command Extremities: No edema   Data Reviewed: I have personally reviewed following labs and imaging studies  CBC: Recent Labs  Lab 03/27/22 1609 03/28/22 0500 03/29/22 0453 03/30/22 1800 04/01/22 0319  WBC 14.2* 9.4 9.5 8.1 8.8  NEUTROABS 12.5* 7.5  --   --   --   HGB 9.4* 9.3* 9.7* 9.2* 9.0*  HCT 29.4* 28.8* 29.6* 28.3* 28.2*  MCV 88.6 87.3 87.8 88.7 89.0  PLT 293 280 300 300 893    Basic Metabolic Panel: Recent Labs  Lab 03/28/22 0500 03/29/22 0453 03/30/22 1800 03/31/22 0955 04/01/22 0319  NA 135 134* 133* 132* 135  K 3.5 4.0 3.3* 3.2* 4.2  CL 102 103 104 103 105  CO2 '25 26 24 22 25  '$ GLUCOSE 150* 145* 159* 255* 110*  BUN 7* <5* 6* <5* 6*  CREATININE 0.61 0.56 0.51 0.60 0.50  CALCIUM 8.3* 8.5* 8.4* 8.3* 8.4*  MG 1.7  --   --   --  1.8    GFR: Estimated Creatinine Clearance: 69.2 mL/min (by C-G formula based on SCr of 0.5  mg/dL). Liver Function Tests: Recent Labs  Lab 03/27/22 1609 03/28/22 0500  AST 23 19  ALT 24 23  ALKPHOS 71 68  BILITOT 0.6 0.6  PROT 6.5 6.4*  ALBUMIN 2.8* 2.6*    Recent Labs  Lab 03/28/22 0152  LIPASE 29    No results for input(s): "AMMONIA" in the last 168 hours. Coagulation Profile: Recent Labs  Lab 03/28/22 0500  INR 1.4*    Cardiac Enzymes: No results for input(s): "CKTOTAL", "CKMB", "CKMBINDEX", "TROPONINI" in the last 168 hours. BNP (last 3 results) No results for input(s): "PROBNP" in the last 8760 hours. HbA1C: No results for input(s): "HGBA1C" in the last 72 hours. CBG: Recent Labs  Lab 04/01/22 0729 04/01/22 1158 04/01/22 1718 04/01/22 2138 04/02/22 0737  GLUCAP 119* 244* 167* 157* 135*    Lipid Profile: No results for input(s): "CHOL", "HDL", "LDLCALC", "TRIG", "CHOLHDL", "LDLDIRECT" in the last 72 hours. Thyroid Function Tests: No results for input(s): "TSH", "T4TOTAL", "FREET4", "T3FREE", "THYROIDAB" in the last 72 hours. Anemia Panel: No results for input(s): "VITAMINB12", "FOLATE", "FERRITIN", "TIBC", "IRON", "RETICCTPCT" in the  last 72 hours. Sepsis Labs: Recent Labs  Lab 03/28/22 0152  PROCALCITON <0.10     Recent Results (from the past 240 hour(s))  C Difficile Quick Screen w PCR reflex     Status: Abnormal   Collection Time: 03/28/22 12:27 PM   Specimen: STOOL  Result Value Ref Range Status   C Diff antigen POSITIVE (A) NEGATIVE Final    Comment: RESULT CALLED TO, READ BACK BY AND VERIFIED WITH: rn p armstrong at 1537 03/28/22 cruickshank a    C Diff toxin POSITIVE (A) NEGATIVE Final    Comment: RESULT CALLED TO, READ BACK BY AND VERIFIED WITH: rn p armstrong at 1537 03/28/22 cruickshank a    C Diff interpretation Toxin producing C. difficile detected.  Final    Comment: CRITICAL RESULT CALLED TO, READ BACK BY AND VERIFIED WITH: RN P ARMSTRONG AT 1537 03/28/22 CRUICKSHANK A Performed at [facility], [place] [address], [place], [place] [address]   Gastrointestinal Panel by PCR , Stool     Status: Abnormal   Collection Time: 03/28/22 12:27 PM   Specimen: Stool  Result Value Ref Range Status   Campylobacter species NOT DETECTED NOT DETECTED Final   Plesimonas shigelloides NOT DETECTED NOT DETECTED Final   Salmonella species NOT DETECTED NOT DETECTED Final   Yersinia enterocolitica NOT DETECTED NOT DETECTED Final   Vibrio species NOT DETECTED NOT DETECTED Final   Vibrio cholerae NOT DETECTED NOT DETECTED Final   Enteroaggregative E coli (EAEC) NOT DETECTED NOT DETECTED Final   Enteropathogenic E coli (EPEC) NOT DETECTED NOT DETECTED Final   Enterotoxigenic E coli (ETEC) NOT DETECTED NOT DETECTED Final   Shiga like toxin producing E coli (STEC) NOT DETECTED NOT DETECTED Final   Shigella/Enteroinvasive E coli (EIEC) NOT DETECTED NOT DETECTED Final   Cryptosporidium NOT DETECTED NOT DETECTED Final   Cyclospora cayetanensis NOT DETECTED NOT DETECTED Final   Entamoeba histolytica NOT DETECTED NOT DETECTED Final   Giardia lamblia NOT DETECTED NOT DETECTED Final   Adenovirus F40/41 NOT DETECTED NOT DETECTED Final   Astrovirus NOT DETECTED NOT DETECTED Final   Norovirus GI/GII DETECTED (A) NOT DETECTED Final    Comment: RESULT CALLED TO, READ BACK BY AND VERIFIED WITH: SUSAN KUSNITZ AT 1139 03/29/22.PMF    Rotavirus A NOT DETECTED NOT DETECTED Final   Sapovirus (I, II, IV, and V) NOT DETECTED NOT DETECTED Final    Comment: Performed at [facility], [address], [place], [place] [address]         Radiology Studies: No results found.      Scheduled Meds:  (feeding supplement) PROSource Plus  30 mL Oral TID BM   amLODipine  10 mg Oral QHS   apixaban  5 mg Oral BID   atorvastatin  20 mg Oral QHS   Chlorhexidine Gluconate Cloth  6 each Topical Daily   feeding supplement  1 Container Oral TID BM   fentaNYL  2 patch Transdermal Q48H   fluconazole  100 mg Oral Daily    gabapentin  400 mg Oral TID   insulin aspart  0-15 Units Subcutaneous TID AC & HS   lipase/protease/amylase  72,000 Units Oral TID AC   multivitamin with minerals  1 tablet Oral Daily   nystatin  5 mL Oral QID   saccharomyces boulardii  250 mg Oral BID   sodium chloride flush  3 mL Intravenous Q12H   sodium chloride  1 g Oral BID WC   vancomycin  125  mg Oral QID   Continuous Infusions:  metronidazole        LOS: 5 days    Time spent: 35 minutes/     Siris Hoos A Allysson Rinehimer, MD [facility]   If 7PM-7AM, please contact night-coverage [email or web address]  04/02/2022, 9:19 AM

## 2022-04-03 DIAGNOSIS — A0472 Enterocolitis due to Clostridium difficile, not specified as recurrent: Secondary | ICD-10-CM | POA: Diagnosis not present

## 2022-04-03 LAB — GLUCOSE, CAPILLARY
Glucose-Capillary: 148 mg/dL — ABNORMAL HIGH (ref 70–99)
Glucose-Capillary: 182 mg/dL — ABNORMAL HIGH (ref 70–99)
Glucose-Capillary: 202 mg/dL — ABNORMAL HIGH (ref 70–99)
Glucose-Capillary: 103 mg/dL — ABNORMAL HIGH (ref 70–99)

## 2022-04-03 NOTE — Progress Notes (Signed)
It seems as if the diarrhea might be getting a little bit better.  She is eating a little bit more.  I think she might be eating some solid food now.  There is no vomiting.  She still having some abdominal pain.  However, this seems to be relatively well-tolerated.  She has had no bleeding.  She is on Eliquis.  She is out of bed.  She is walking.  Her hemoglobin is down a little bit from yesterday.  I do not have the results back today.  We will have to see what her hemoglobin is.  Her blood sugars have been on the high side.  Her vital signs show temperature of 98.4.  Pulse 96.  Blood pressure 116/55.  Her lungs are clear bilaterally.  Cardiac exam regular rate and rhythm.  Abdomen is soft.  Bowel sounds are decreased but present.  There is still a little bit of tenderness to palpation in the lower left abdomen.  Neurological exam is nonfocal.  Extremity shows no clubbing, cyanosis or edema.  We will have to see what the labs look like.  Again, if her hemoglobin is lower, we may have to think about giving her a transfusion.  Again I can always send off an erythropoietin level on her.  For right now, I would like to think that she should be able to go home soon.  Again she seems to be improving.  I cannot imagine that she is infectious from the C. difficile for the norovirus.  She obviously has gotten incredible care from all the staff on [address].   Lattie Haw, MD  Darlyn Chamber 17:14

## 2022-04-03 NOTE — Progress Notes (Signed)
PROGRESS NOTE    Melanie Alvarez  ERX:[id number] DOB: Feb 02, 1956 DOA: 03/27/2022 PCP: Flossie Buffy, NP    Brief Narrative:   Melanie Alvarez is a 66 y.o. 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  In the ED, imaging notable for mild wall thickening ascending colon concerning for inflammatory versus infectious colitis.  Patient was admitted for further evaluation and treatment under the hospitalist service.    Recently hospitalized at [facility] 7/17 through 8/6 for intractable nausea/vomiting and abdominal pain.  Underwent anoscopy 7/21 which showed extrinsic duodenal stenosis/obstruction and underwent duodenal stent placement on 7/25 with improvement of her symptoms.  Patient was also started on Creon during the hospitalization for concerns of pancreatic insufficiency.    Patient was found to have C. difficile colitis/neurovirus per PCR testing and was started on oral vancomycin on 8/10.  Assessment & Plan:   C. difficile colitis, acute Norovirus Presenting with nausea/vomiting and abdominal pain.  CT abdomen/pelvis notable for mild wall thickening ascending colon concerning for inflammatory versus infectious colitis.  GI PCR panel positive for norovirus, C. difficile PCR panel positive. --Vancomycin 125 mg PO QID, plan 10 day course --Metronidazole 500 mg IV TID --Florastor --Continue to closely monitor BM  Hyponatremia  Etiology likely multifactorial with SIADH versus dehydration.  Received IV fluids on admission. --Na 134, stable --Sodium chloride 1 g PO BID  Essential hypertension --Amlodipine 10 mg p.o.  daily  Hyperlipidemia --Atorvastatin 20 mg p.o. daily  Type 2 diabetes mellitus On glipizide 5 mg p.o. daily and metformin 750 mg p.o. daily at home.  Hemoglobin A1c 5.7 on 03/08/2022, well controlled. --Holding oral hypoglycemics while inpatient --moderate SSI for coverage --CBGs qAC/HS  Pancreatic insufficiency --Creon 72,000 units TID  History of pancreatic cancer with recurrence Follows with medical oncology, Dr. Marin Olp. --Continue fentanyl patch --Outpatient follow-up on discharge  Hx portal/mesenteric vein thrombosis --Apixaban 5 mg p.o. twice daily  Hx hepatitis C, cirrhosis Underwent treatment for hepatitis B, prior history of EtOH abuse.  Outpatient follow-up with gastroenterology.  Anemia of chronic disease Hemoglobin 9.1, stable.  Hypokalemia Repleted.  Potassium 4.2 today.   DVT prophylaxis: apixaban (ELIQUIS) tablet 5 mg Start: 03/27/22 2245 apixaban (ELIQUIS) tablet 5 mg    Code Status: Full Code Family Communication:   Disposition Plan:  Level of care: Med-Surg Status is: Inpatient Remains inpatient appropriate because: Needs improvement in the quantity of bowel movements and increased oral intake before safe discharge home    Consultants:  Medical oncology, Dr. Marin Olp  Procedures:  None  Antimicrobials:  Metronidazole 8/10>> P.o. vancomycin 8/10>>   Subjective: Patient seen examined at bedside, resting comfortably.  Sleeping but easily arousable.  Continues to feel weak and fatigued.  Reports to loose bowel movements this morning with mild left lower quadrant abdominal discomfort which is much improved since admission.  Reports multiple bowel movements yesterday, cannot quantify.  Seen by medical oncology, Dr. Marin Olp this morning.  Continues with poor oral intake.  Discussed with patient needs improvement in the quality of her bowel movements as well as increased proved oral intake before stable for discharge home.  No other questions or  concerns at this time.  Denies headache, no dizziness, no chest pain, no shortness  of breath, no fever/chills/night sweats, no current nausea/vomiting, no focal weakness, no paresthesias.  No acute events overnight per nursing staff.  Objective: Vitals:   04/02/22 0602 04/02/22 1359 04/02/22 2149 04/03/22 0541  BP: (!) 143/85 117/80 130/68 (!) 116/55  Pulse: 98 86 77 96  Resp: '20 18 18 16  '$ Temp: 97.9 F (36.6 C) 98.6 F (37 C) 98.6 F (37 C) 98.4 F (36.9 C)  TempSrc: Oral Oral Oral Oral  SpO2: 98% 100% 99% 96%  Weight:      Height:        Intake/Output Summary (Last 24 hours) at 04/03/2022 1023 Last data filed at 04/03/2022 0800 Gross per 24 hour  Intake 2540.07 ml  Output 1000 ml  Net 1540.07 ml   Filed Weights   03/31/22 0500 04/01/22 0500 04/02/22 0500  Weight: 70.1 kg 73.9 kg 75.8 kg    Examination:  Physical Exam: GEN: NAD, alert and oriented x 3, wd/wn HEENT: NCAT, PERRL, EOMI, sclera clear, MMM PULM: CTAB w/o wheezes/crackles, normal respiratory effort CV: RRR w/o M/G/R GI: abd soft, mild LLQ TTP, nondistended, NABS, no R/G/M MSK: no peripheral edema, muscle strength globally intact 5/5 bilateral upper/lower extremities NEURO: CN II-XII intact, no focal deficits, sensation to light touch intact PSYCH: normal mood/affect Integumentary: dry/intact, no rashes or wounds    Data Reviewed: I have personally reviewed following labs and imaging studies  CBC: Recent Labs  Lab 03/27/22 1609 03/28/22 0500 03/29/22 0453 03/30/22 1800 04/01/22 0319 04/02/22 2213  WBC 14.2* 9.4 9.5 8.1 8.8 8.9  NEUTROABS 12.5* 7.5  --   --   --   --   HGB 9.4* 9.3* 9.7* 9.2* 9.0* 9.1*  HCT 29.4* 28.8* 29.6* 28.3* 28.2* 28.8*  MCV 88.6 87.3 87.8 88.7 89.0 88.9  PLT 293 280 300 300 296 937   Basic Metabolic Panel: Recent Labs  Lab 03/28/22 0500 03/29/22 0453 03/30/22 1800 03/31/22 0955 04/01/22 0319 04/02/22 2213  NA 135 134* 133* 132* 135 134*  K 3.5 4.0 3.3* 3.2* 4.2  4.2  CL 102 103 104 103 105 103  CO2 '25 26 24 22 25 27  '$ GLUCOSE 150* 145* 159* 255* 110* 172*  BUN 7* <5* 6* <5* 6* 7*  CREATININE 0.61 0.56 0.51 0.60 0.50 0.50  CALCIUM 8.3* 8.5* 8.4* 8.3* 8.4* 8.2*  MG 1.7  --   --   --  1.8  --    GFR: Estimated Creatinine Clearance: 69.2 mL/min (by C-G formula based on SCr of 0.5 mg/dL). Liver Function Tests: Recent Labs  Lab 03/27/22 1609 03/28/22 0500  AST 23 19  ALT 24 23  ALKPHOS 71 68  BILITOT 0.6 0.6  PROT 6.5 6.4*  ALBUMIN 2.8* 2.6*   Recent Labs  Lab 03/28/22 0152  LIPASE 29   No results for input(s): "AMMONIA" in the last 168 hours. Coagulation Profile: Recent Labs  Lab 03/28/22 0500  INR 1.4*   Cardiac Enzymes: No results for input(s): "CKTOTAL", "CKMB", "CKMBINDEX", "TROPONINI" in the last 168 hours. BNP (last 3 results) No results for input(s): "PROBNP" in the last 8760 hours. HbA1C: No results for input(s): "HGBA1C" in the last 72 hours. CBG: Recent Labs  Lab 04/02/22 0737 04/02/22 1144 04/02/22 1611 04/02/22 2146 04/03/22 0731  GLUCAP 135* 203* 131* 171* 148*   Lipid Profile: No results for input(s): "CHOL", "HDL", "LDLCALC", "TRIG", "CHOLHDL", "LDLDIRECT" in the last 72 hours. Thyroid Function Tests: No results for input(s): "TSH", "T4TOTAL", "FREET4", "T3FREE", "THYROIDAB" in the last 72  hours. Anemia Panel: No results for input(s): "VITAMINB12", "FOLATE", "FERRITIN", "TIBC", "IRON", "RETICCTPCT" in the last 72 hours. Sepsis Labs: Recent Labs  Lab 03/28/22 0152  PROCALCITON <0.10    Recent Results (from the past 240 hour(s))  C Difficile Quick Screen w PCR reflex     Status: Abnormal   Collection Time: 03/28/22 12:27 PM   Specimen: STOOL  Result Value Ref Range Status   C Diff antigen POSITIVE (A) NEGATIVE Final    Comment: RESULT CALLED TO, READ BACK BY AND VERIFIED WITH: rn p armstrong at 1537 03/28/22 cruickshank a    C Diff toxin POSITIVE (A) NEGATIVE Final    Comment: RESULT CALLED TO,  READ BACK BY AND VERIFIED WITH: rn p armstrong at 1537 03/28/22 cruickshank a    C Diff interpretation Toxin producing C. difficile detected.  Final    Comment: CRITICAL RESULT CALLED TO, READ BACK BY AND VERIFIED WITH: RN P ARMSTRONG AT 1537 03/28/22 CRUICKSHANK A Performed at [facility], [place] [address] [place], [place] [address]   Gastrointestinal Panel by PCR , Stool     Status: Abnormal   Collection Time: 03/28/22 12:27 PM   Specimen: Stool  Result Value Ref Range Status   Campylobacter species NOT DETECTED NOT DETECTED Final   Plesimonas shigelloides NOT DETECTED NOT DETECTED Final   Salmonella species NOT DETECTED NOT DETECTED Final   Yersinia enterocolitica NOT DETECTED NOT DETECTED Final   Vibrio species NOT DETECTED NOT DETECTED Final   Vibrio cholerae NOT DETECTED NOT DETECTED Final   Enteroaggregative E coli (EAEC) NOT DETECTED NOT DETECTED Final   Enteropathogenic E coli (EPEC) NOT DETECTED NOT DETECTED Final   Enterotoxigenic E coli (ETEC) NOT DETECTED NOT DETECTED Final   Shiga like toxin producing E coli (STEC) NOT DETECTED NOT DETECTED Final   Shigella/Enteroinvasive E coli (EIEC) NOT DETECTED NOT DETECTED Final   Cryptosporidium NOT DETECTED NOT DETECTED Final   Cyclospora cayetanensis NOT DETECTED NOT DETECTED Final   Entamoeba histolytica NOT DETECTED NOT DETECTED Final   Giardia lamblia NOT DETECTED NOT DETECTED Final   Adenovirus F40/41 NOT DETECTED NOT DETECTED Final   Astrovirus NOT DETECTED NOT DETECTED Final   Norovirus GI/GII DETECTED (A) NOT DETECTED Final    Comment: RESULT CALLED TO, READ BACK BY AND VERIFIED WITH: SUSAN KUSNITZ AT 1139 03/29/22.PMF    Rotavirus A NOT DETECTED NOT DETECTED Final   Sapovirus (I, II, IV, and V) NOT DETECTED NOT DETECTED Final    Comment: Performed at [facility], [address] [place], [place] [address]         Radiology Studies: No results found.      Scheduled Meds:   (feeding supplement) PROSource Plus  30 mL Oral TID BM   amLODipine  10 mg Oral QHS   apixaban  5 mg Oral BID   atorvastatin  20 mg Oral QHS   Chlorhexidine Gluconate Cloth  6 each Topical Daily   feeding supplement  1 Container Oral TID BM   fentaNYL  2 patch Transdermal Q48H   fluconazole  100 mg Oral Daily   gabapentin  400 mg Oral TID   insulin aspart  0-15 Units Subcutaneous TID AC & HS   lipase/protease/amylase  72,000 Units Oral TID AC   multivitamin with minerals  1 tablet Oral Daily   nystatin  5 mL Oral QID   saccharomyces boulardii  250 mg Oral BID   sodium chloride flush  3 mL Intravenous Q12H  sodium chloride  1 g Oral BID WC   vancomycin  125 mg Oral QID   Continuous Infusions:  metronidazole 500 mg (04/03/22 0938)     LOS: 6 days    Time spent: 48 minutes spent on chart review, discussion with nursing staff, consultants, updating family and interview/physical exam; more than 50% of that time was spent in counseling and/or coordination of care.     Skye Plamondon J [place] DO [facility] Available via Epic secure chat 7am-7pm After these hours, please refer to coverage provider listed on [email or web address] 04/03/2022, 10:23 AM

## 2022-04-04 DIAGNOSIS — A0472 Enterocolitis due to Clostridium difficile, not specified as recurrent: Secondary | ICD-10-CM | POA: Diagnosis not present

## 2022-04-04 LAB — GLUCOSE, CAPILLARY
Glucose-Capillary: 147 mg/dL — ABNORMAL HIGH (ref 70–99)
Glucose-Capillary: 175 mg/dL — ABNORMAL HIGH (ref 70–99)
Glucose-Capillary: 226 mg/dL — ABNORMAL HIGH (ref 70–99)
Glucose-Capillary: 241 mg/dL — ABNORMAL HIGH (ref 70–99)

## 2022-04-04 NOTE — Progress Notes (Signed)
PROGRESS NOTE    DERINDA BARTUS  WJX:[id number] DOB: January 21, 1956 DOA: 03/27/2022 PCP: Flossie Buffy, NP    Brief Narrative:   Melanie Alvarez is a 66 y.o. 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  In the ED, imaging notable for mild wall thickening ascending colon concerning for inflammatory versus infectious colitis.  Patient was admitted for further evaluation and treatment under the hospitalist service.    Recently hospitalized at [facility] 7/17 through 8/6 for intractable nausea/vomiting and abdominal pain.  Underwent anoscopy 7/21 which showed extrinsic duodenal stenosis/obstruction and underwent duodenal stent placement on 7/25 with improvement of her symptoms.  Patient was also started on Creon during the hospitalization for concerns of pancreatic insufficiency.    Patient was found to have C. difficile colitis/neurovirus per PCR testing and was started on oral vancomycin on 8/10.  Assessment & Plan:   C. difficile colitis, acute Norovirus Presenting with nausea/vomiting and abdominal pain.  CT abdomen/pelvis notable for mild wall thickening ascending colon concerning for inflammatory versus infectious colitis.  GI PCR panel positive for norovirus, C. difficile PCR panel positive. --Vancomycin 125 mg PO QID, plan 10 day course --Metronidazole 500 mg IV TID --Florastor --Continue to closely monitor BM  Hyponatremia  Etiology likely multifactorial with SIADH versus dehydration.  Received IV fluids on admission. --Na 134, stable --Sodium chloride 1 g PO BID  Essential hypertension --Amlodipine 10 mg p.o.  daily  Hyperlipidemia --Atorvastatin 20 mg p.o. daily  Type 2 diabetes mellitus On glipizide 5 mg p.o. daily and metformin 750 mg p.o. daily at home.  Hemoglobin A1c 5.7 on 03/08/2022, well controlled. --Holding oral hypoglycemics while inpatient --moderate SSI for coverage --CBGs qAC/HS  Pancreatic insufficiency --Creon 72,000 units TID  History of pancreatic cancer with recurrence Follows with medical oncology, Dr. Marin Olp. --Continue fentanyl patch --Outpatient follow-up on discharge  Hx portal/mesenteric vein thrombosis --Apixaban 5 mg p.o. twice daily  Hx hepatitis C, cirrhosis Underwent treatment for hepatitis B, prior history of EtOH abuse.  Outpatient follow-up with gastroenterology.  Anemia of chronic disease Hemoglobin 9.1, stable.  Hypokalemia Repleted.  Potassium 4.2 today.   DVT prophylaxis: apixaban (ELIQUIS) tablet 5 mg Start: 03/27/22 2245 apixaban (ELIQUIS) tablet 5 mg    Code Status: Full Code Family Communication:   Disposition Plan:  Level of care: Med-Surg Status is: Inpatient Remains inpatient appropriate because: Needs improvement in the quantity of bowel movements and increased oral intake before safe discharge home; anticipate discharge home tomorrow    Consultants:  Medical oncology, Dr. Marin Olp  Procedures:  None  Antimicrobials:  Metronidazole 8/10>> P.o. vancomycin 8/10>>   Subjective: Patient seen examined at bedside, resting comfortably.  Sitting on bedside couch, eating breakfast.  Continues with loose stools but slowly improving.  Reports that the insulin makes her feel "tired".  RN present at bedside.  Discussed likely anticipation of discharge home tomorrow.  No other questions or concerns at this time.  Denies headache, no dizziness, no chest pain, no shortness of breath, no fever/chills/night sweats, no current nausea/vomiting, no focal weakness, no paresthesias.  No acute events overnight per nursing  staff.  Objective: Vitals:   04/03/22 0541 04/03/22 1327 04/03/22 2135 04/04/22 0538  BP: (!) 116/55 (!) 145/79 Marland Kitchen)  154/93 (!) 141/85  Pulse: 96 94 94 74  Resp: '16 18 18 18  '$ Temp: 98.4 F (36.9 C) 98.5 F (36.9 C) (!) 97.4 F (36.3 C) 98.3 F (36.8 C)  TempSrc: Oral Oral Oral Oral  SpO2: 96% 100% 100% 99%  Weight:      Height:        Intake/Output Summary (Last 24 hours) at 04/04/2022 1149 Last data filed at 04/04/2022 1000 Gross per 24 hour  Intake 1619.94 ml  Output 1750 ml  Net -130.06 ml   Filed Weights   03/31/22 0500 04/01/22 0500 04/02/22 0500  Weight: 70.1 kg 73.9 kg 75.8 kg    Examination:  Physical Exam: GEN: NAD, alert and oriented x 3, wd/wn HEENT: NCAT, PERRL, EOMI, sclera clear, MMM PULM: CTAB w/o wheezes/crackles, normal respiratory effort CV: RRR w/o M/G/R GI: abd soft, mild LLQ TTP, nondistended, NABS, no R/G/M MSK: no peripheral edema, muscle strength globally intact 5/5 bilateral upper/lower extremities NEURO: CN II-XII intact, no focal deficits, sensation to light touch intact PSYCH: normal mood/affect Integumentary: dry/intact, no rashes or wounds    Data Reviewed: I have personally reviewed following labs and imaging studies  CBC: Recent Labs  Lab 03/29/22 0453 03/30/22 1800 04/01/22 0319 04/02/22 2213  WBC 9.5 8.1 8.8 8.9  HGB 9.7* 9.2* 9.0* 9.1*  HCT 29.6* 28.3* 28.2* 28.8*  MCV 87.8 88.7 89.0 88.9  PLT 300 300 296 527   Basic Metabolic Panel: Recent Labs  Lab 03/29/22 0453 03/30/22 1800 03/31/22 0955 04/01/22 0319 04/02/22 2213  NA 134* 133* 132* 135 134*  K 4.0 3.3* 3.2* 4.2 4.2  CL 103 104 103 105 103  CO2 '26 24 22 25 27  '$ GLUCOSE 145* 159* 255* 110* 172*  BUN <5* 6* <5* 6* 7*  CREATININE 0.56 0.51 0.60 0.50 0.50  CALCIUM 8.5* 8.4* 8.3* 8.4* 8.2*  MG  --   --   --  1.8  --    GFR: Estimated Creatinine Clearance: 69.2 mL/min (by C-G formula based on SCr of 0.5 mg/dL). Liver Function Tests: No results for  input(s): "AST", "ALT", "ALKPHOS", "BILITOT", "PROT", "ALBUMIN" in the last 168 hours.  No results for input(s): "LIPASE", "AMYLASE" in the last 168 hours.  No results for input(s): "AMMONIA" in the last 168 hours. Coagulation Profile: No results for input(s): "INR", "PROTIME" in the last 168 hours.  Cardiac Enzymes: No results for input(s): "CKTOTAL", "CKMB", "CKMBINDEX", "TROPONINI" in the last 168 hours. BNP (last 3 results) No results for input(s): "PROBNP" in the last 8760 hours. HbA1C: No results for input(s): "HGBA1C" in the last 72 hours. CBG: Recent Labs  Lab 04/03/22 0731 04/03/22 1151 04/03/22 1645 04/03/22 2134 04/04/22 0732  GLUCAP 148* 182* 202* 103* 147*   Lipid Profile: No results for input(s): "CHOL", "HDL", "LDLCALC", "TRIG", "CHOLHDL", "LDLDIRECT" in the last 72 hours. Thyroid Function Tests: No results for input(s): "TSH", "T4TOTAL", "FREET4", "T3FREE", "THYROIDAB" in the last 72 hours. Anemia Panel: No results for input(s): "VITAMINB12", "FOLATE", "FERRITIN", "TIBC", "IRON", "RETICCTPCT" in the last 72 hours. Sepsis Labs: No results for input(s): "PROCALCITON", "LATICACIDVEN" in the last 168 hours.   Recent Results (from the past 240 hour(s))  C Difficile Quick Screen w PCR reflex     Status: Abnormal   Collection Time: 03/28/22 12:27 PM   Specimen: STOOL  Result Value Ref Range Status   C Diff antigen POSITIVE (A) NEGATIVE Final    Comment: RESULT CALLED TO, READ BACK BY AND VERIFIED WITH: rn p  armstrong at 1537 03/28/22 cruickshank a    C Diff toxin POSITIVE (A) NEGATIVE Final    Comment: RESULT CALLED TO, READ BACK BY AND VERIFIED WITH: rn p armstrong at 1537 03/28/22 cruickshank a    C Diff interpretation Toxin producing C. difficile detected.  Final    Comment: CRITICAL RESULT CALLED TO, READ BACK BY AND VERIFIED WITH: RN P ARMSTRONG AT 1537 03/28/22 CRUICKSHANK A Performed at [facility] [place] [address] [place],  [place] [address]   Gastrointestinal Panel by PCR , Stool     Status: Abnormal   Collection Time: 03/28/22 12:27 PM   Specimen: Stool  Result Value Ref Range Status   Campylobacter species NOT DETECTED NOT DETECTED Final   Plesimonas shigelloides NOT DETECTED NOT DETECTED Final   Salmonella species NOT DETECTED NOT DETECTED Final   Yersinia enterocolitica NOT DETECTED NOT DETECTED Final   Vibrio species NOT DETECTED NOT DETECTED Final   Vibrio cholerae NOT DETECTED NOT DETECTED Final   Enteroaggregative E coli (EAEC) NOT DETECTED NOT DETECTED Final   Enteropathogenic E coli (EPEC) NOT DETECTED NOT DETECTED Final   Enterotoxigenic E coli (ETEC) NOT DETECTED NOT DETECTED Final   Shiga like toxin producing E coli (STEC) NOT DETECTED NOT DETECTED Final   Shigella/Enteroinvasive E coli (EIEC) NOT DETECTED NOT DETECTED Final   Cryptosporidium NOT DETECTED NOT DETECTED Final   Cyclospora cayetanensis NOT DETECTED NOT DETECTED Final   Entamoeba histolytica NOT DETECTED NOT DETECTED Final   Giardia lamblia NOT DETECTED NOT DETECTED Final   Adenovirus F40/41 NOT DETECTED NOT DETECTED Final   Astrovirus NOT DETECTED NOT DETECTED Final   Norovirus GI/GII DETECTED (A) NOT DETECTED Final    Comment: RESULT CALLED TO, READ BACK BY AND VERIFIED WITH: SUSAN KUSNITZ AT 1139 03/29/22.PMF    Rotavirus A NOT DETECTED NOT DETECTED Final   Sapovirus (I, II, IV, and V) NOT DETECTED NOT DETECTED Final    Comment: Performed at [facility], [address] [place], [place] [address]         Radiology Studies: No results found.      Scheduled Meds:  (feeding supplement) PROSource Plus  30 mL Oral TID BM   amLODipine  10 mg Oral QHS   apixaban  5 mg Oral BID   atorvastatin  20 mg Oral QHS   Chlorhexidine Gluconate Cloth  6 each Topical Daily   feeding supplement  1 Container Oral TID BM   fentaNYL  2 patch Transdermal Q48H   fluconazole  100 mg Oral Daily   gabapentin  400 mg Oral TID    insulin aspart  0-15 Units Subcutaneous TID AC & HS   lipase/protease/amylase  72,000 Units Oral TID AC   multivitamin with minerals  1 tablet Oral Daily   nystatin  5 mL Oral QID   saccharomyces boulardii  250 mg Oral BID   sodium chloride flush  3 mL Intravenous Q12H   sodium chloride  1 g Oral BID WC   vancomycin  125 mg Oral QID   Continuous Infusions:  metronidazole 500 mg (04/04/22 0939)     LOS: 7 days    Time spent: 48 minutes spent on chart review, discussion with nursing staff, consultants, updating family and interview/physical exam; more than 50% of that time was spent in counseling and/or coordination of care.     Zaivion Kundrat J [place] DO [facility] Available via Epic secure chat 7am-7pm After these hours, please refer to coverage provider listed  on [email or web address] 04/04/2022, 11:49 AM

## 2022-04-04 NOTE — Plan of Care (Signed)
  Problem: Clinical Measurements: Goal: Ability to maintain clinical measurements within normal limits will improve Outcome: Progressing   Problem: Activity: Goal: Risk for activity intolerance will decrease Outcome: Not Progressing   Problem: Coping: Goal: Level of anxiety will decrease Outcome: Progressing

## 2022-04-05 DIAGNOSIS — A0472 Enterocolitis due to Clostridium difficile, not specified as recurrent: Secondary | ICD-10-CM | POA: Diagnosis not present

## 2022-04-05 LAB — COMPREHENSIVE METABOLIC PANEL
ALT: 23 U/L (ref 0–44)
AST: 42 U/L — ABNORMAL HIGH (ref 15–41)
Albumin: 2.7 g/dL — ABNORMAL LOW (ref 3.5–5.0)
Alkaline Phosphatase: 131 U/L — ABNORMAL HIGH (ref 38–126)
Anion gap: 6 (ref 5–15)
BUN: 7 mg/dL — ABNORMAL LOW (ref 8–23)
CO2: 29 mmol/L (ref 22–32)
Calcium: 8.5 mg/dL — ABNORMAL LOW (ref 8.9–10.3)
Chloride: 101 mmol/L (ref 98–111)
Creatinine, Ser: 0.57 mg/dL (ref 0.44–1.00)
GFR, Estimated: >60 mL/min (ref >60)
Glucose, Bld: 119 mg/dL — ABNORMAL HIGH (ref 70–99)
Potassium: 3.9 mmol/L (ref 3.5–5.1)
Sodium: 136 mmol/L (ref 135–145)
Total Bilirubin: 0.3 mg/dL (ref 0.3–1.2)
Total Protein: 6.5 g/dL (ref 6.5–8.1)

## 2022-04-05 LAB — CBC WITH DIFFERENTIAL/PLATELET
Abs Immature Granulocytes: 0.06 10*3/uL (ref 0.00–0.07)
Basophils Absolute: 0.1 10*3/uL (ref 0.0–0.1)
Basophils Relative: 1 %
Eosinophils Absolute: 0.2 10*3/uL (ref 0.0–0.5)
Eosinophils Relative: 2 %
HCT: 30.1 % — ABNORMAL LOW (ref 36.0–46.0)
Hemoglobin: 9.7 g/dL — ABNORMAL LOW (ref 12.0–15.0)
Immature Granulocytes: 1 %
Lymphocytes Relative: 13 %
Lymphs Abs: 1.5 10*3/uL (ref 0.7–4.0)
MCH: 28.9 pg (ref 26.0–34.0)
MCHC: 32.2 g/dL (ref 30.0–36.0)
MCV: 89.6 fL (ref 80.0–100.0)
Monocytes Absolute: 0.7 10*3/uL (ref 0.1–1.0)
Monocytes Relative: 7 %
Neutro Abs: 8.4 10*3/uL — ABNORMAL HIGH (ref 1.7–7.7)
Neutrophils Relative %: 76 %
Platelets: 363 10*3/uL (ref 150–400)
RBC: 3.36 MIL/uL — ABNORMAL LOW (ref 3.87–5.11)
RDW: 15.6 % — ABNORMAL HIGH (ref 11.5–15.5)
WBC: 11 10*3/uL — ABNORMAL HIGH (ref 4.0–10.5)
nRBC: 0 % (ref 0.0–0.2)

## 2022-04-05 LAB — GLUCOSE, CAPILLARY
Glucose-Capillary: 128 mg/dL — ABNORMAL HIGH (ref 70–99)
Glucose-Capillary: 152 mg/dL — ABNORMAL HIGH (ref 70–99)

## 2022-04-05 LAB — ERYTHROPOIETIN: Erythropoietin: 9.4 m[IU]/mL (ref 2.6–18.5)

## 2022-04-05 MED ORDER — HEPARIN SOD (PORK) LOCK FLUSH 100 UNIT/ML IV SOLN
500.0000 [IU] | INTRAVENOUS | Status: AC | PRN
  Administered 2022-04-05: 500 [IU]
  Filled 2022-04-05: qty 5

## 2022-04-05 MED ORDER — VANCOMYCIN HCL 125 MG PO CAPS: 125.0000 mg | ORAL_CAPSULE | Freq: Four times a day (QID) | ORAL | 0 refills | Status: AC

## 2022-04-05 MED ORDER — METRONIDAZOLE 500 MG PO TABS: 500.0000 mg | ORAL_TABLET | Freq: Three times a day (TID) | ORAL | 0 refills | Status: AC

## 2022-04-05 MED ORDER — FLUCONAZOLE 100 MG PO TABS: 100.0000 mg | ORAL_TABLET | Freq: Every day | ORAL | 0 refills | Status: AC

## 2022-04-05 MED ORDER — SACCHAROMYCES BOULARDII 250 MG PO CAPS: 250.0000 mg | ORAL_CAPSULE | Freq: Two times a day (BID) | ORAL | 0 refills | Status: AC

## 2022-04-05 NOTE — Progress Notes (Signed)
Mobility Specialist Cancellation/Refusal Note:    04/05/22 0920  Mobility  Activity Refused mobility     Reason for Cancellation/Refusal: Pt declined mobility at this time. Pt mentioned being IND.  Will check back as schedule permits.    [facility]

## 2022-04-05 NOTE — Plan of Care (Signed)
  Problem: Education: Goal: Knowledge of General Education information will improve Description: Including pain rating scale, medication(s)/side effects and non-pharmacologic comfort measures Outcome: Adequate for Discharge   Problem: Health Behavior/Discharge Planning: Goal: Ability to manage health-related needs will improve Outcome: Adequate for Discharge   Problem: Clinical Measurements: Goal: Ability to maintain clinical measurements within normal limits will improve Outcome: Adequate for Discharge Goal: Will remain free from infection Outcome: Adequate for Discharge Goal: Diagnostic test results will improve Outcome: Adequate for Discharge Goal: Respiratory complications will improve Outcome: Adequate for Discharge Goal: Cardiovascular complication will be avoided Outcome: Adequate for Discharge   Problem: Activity: Goal: Risk for activity intolerance will decrease Outcome: Adequate for Discharge   Problem: Nutrition: Goal: Adequate nutrition will be maintained Outcome: Adequate for Discharge   Problem: Coping: Goal: Level of anxiety will decrease Outcome: Adequate for Discharge   Problem: Elimination: Goal: Will not experience complications related to bowel motility Outcome: Adequate for Discharge Goal: Will not experience complications related to urinary retention Outcome: Adequate for Discharge   Problem: Pain Managment: Goal: General experience of comfort will improve Outcome: Adequate for Discharge   Problem: Safety: Goal: Ability to remain free from injury will improve Outcome: Adequate for Discharge   Problem: Skin Integrity: Goal: Risk for impaired skin integrity will decrease Outcome: Adequate for Discharge   Problem: Education: Goal: Ability to describe self-care measures that may prevent or decrease complications (Diabetes Survival Skills Education) will improve Outcome: Adequate for Discharge Goal: Individualized Educational Video(s) Outcome:  Adequate for Discharge   Problem: Coping: Goal: Ability to adjust to condition or change in health will improve Outcome: Adequate for Discharge   Problem: Fluid Volume: Goal: Ability to maintain a balanced intake and output will improve Outcome: Adequate for Discharge   Problem: Health Behavior/Discharge Planning: Goal: Ability to identify and utilize available resources and services will improve Outcome: Adequate for Discharge Goal: Ability to manage health-related needs will improve Outcome: Adequate for Discharge   Problem: Metabolic: Goal: Ability to maintain appropriate glucose levels will improve Outcome: Adequate for Discharge   Problem: Nutritional: Goal: Maintenance of adequate nutrition will improve Outcome: Adequate for Discharge Goal: Progress toward achieving an optimal weight will improve Outcome: Adequate for Discharge   Problem: Skin Integrity: Goal: Risk for impaired skin integrity will decrease Outcome: Adequate for Discharge   Problem: Tissue Perfusion: Goal: Adequacy of tissue perfusion will improve Outcome: Adequate for Discharge   Problem: Inadequate Intake (NI-2.1) Goal: Food and/or nutrient delivery Description: Individualized approach for food/nutrient provision. Outcome: Adequate for Discharge

## 2022-04-05 NOTE — Progress Notes (Signed)
Hopefully, Melanie Alvarez will be able to go home today.  She would like to go home today.  From my point of view, I see no problems with her going home.  This hospitalization was all secondary to colitis which, thankfully, has not secondary to her malignancy.  She had C. difficile and norovirus.  It sounds like this she has been doing better.  She is eating.  She is having no nausea or vomiting.  Abdominal pain is her biggest issue.  She is on the fentanyl patch.  She gets hydromorphone for breakthrough pain.  Her labs today show white cell count of 11.  Hemoglobin 9.7.  Platelet count 363,000.  Her BUN is 7 creatinine 0.57.  Glucose is 119.  Calcium 8.5 with an albumin of 2.7.  She is out of bed.  She is ambulating a little bit.  She is not have any problems urinating.  There is no bleeding.  Her vital signs show temperature of 98.4.  Pulse 91.  Blood pressure 128/83.  Her head exam shows no ocular or oral lesions.  There are no palpable cervical or supraclavicular lymph nodes.  Lungs are clear.  Cardiac exam regular rate and rhythm.  Abdomen is soft.  Bowel sounds are present.  There is no fluid wave.  There is still a little bit of tenderness in the lower abdomen.  There is no abdominal mass.  There is no palpable hepatosplenomegaly.  Extremities show some swelling in her feet.  I told her that this likely is from her low albumin and that the more she eats the more protein she gets in, the foot swelling will get better.  Again, she would like to go home.  From a oncologic point of view, I see no problems with this.  I told her that at some point, she is going to need to have treatment for the pancreatic cancer.  I am unsure if she will go along with this.  However, we can talk about this when she gets to the office.  We will try to have her come back in about 10 days or so.  I know that she has had incredible care from everybody up [facility] [address].  Lattie Haw, MD  Romans 8:28

## 2022-04-05 NOTE — Progress Notes (Signed)
Assessment unchanged. Pt verbalized understanding of dc instructions including medications, follow up care and when to call the doctor. Discharged via wc to front entrance by NT.

## 2022-04-05 NOTE — Discharge Summary (Signed)
Physician Discharge Summary  [name] WLN:[id number] DOB: 12/11/1955 DOA: 03/27/2022  PCP: Flossie Buffy, NP  Admit date: 03/27/2022 Discharge date: 04/05/2022  Admitted From: Home Disposition: Home  Recommendations for Outpatient Follow-up:  Follow up with PCP in 1-2 weeks Continue oral vancomycin, Flagyl, to complete course for C. difficile colitis  Home Health: No Equipment/Devices: None  Discharge Condition: Stable CODE STATUS: Full code Diet recommendation: Heart healthy/consistent carbohydrate diet  History of present illness:  Melanie Alvarez is a 66 y.o. 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  In the ED, imaging notable for mild wall thickening ascending colon concerning for inflammatory versus infectious colitis.  Patient was admitted for further evaluation and treatment under the hospitalist service.     Recently hospitalized at [facility] 7/17 through 8/6 for intractable nausea/vomiting and abdominal pain.  Underwent anoscopy 7/21 which showed extrinsic duodenal stenosis/obstruction and underwent duodenal stent placement on 7/25 with improvement of her symptoms.  Patient was also started on Creon during the hospitalization for concerns of pancreatic insufficiency.     Patient was found to have C. difficile colitis/neurovirus per PCR testing and was started on oral vancomycin on 8/10.  Hospital course:  C. difficile colitis, acute Norovirus Presenting with nausea/vomiting and abdominal pain.  CT abdomen/pelvis notable for mild wall thickening ascending colon concerning for inflammatory  versus infectious colitis.  GI PCR panel positive for norovirus, C. difficile PCR panel positive.  Patient was started on oral vancomycin and IV metronidazole with improvement of diarrhea/symptoms.  Patient will continue oral vancomycin and oral metronidazole on discharge complete 10-day course.  Outpatient follow-up with PCP.   Hyponatremia  Etiology likely multifactorial with SIADH versus dehydration.  Received IV fluids on admission.  Sodium 134, stable.  Continue sodium chloride 1 g PO BID   Essential hypertension Continue amlodipine 10 mg p.o. daily   Hyperlipidemia Continue atorvastatin 20 mg p.o. daily   Type 2 diabetes mellitus On glipizide 5 mg p.o. daily and metformin 750 mg p.o. daily at home.  Hemoglobin A1c 5.7 on 03/08/2022, well controlled.  Pancreatic insufficiency Continue Creon 72,000 units TID   History of pancreatic cancer with recurrence Follows with medical oncology, Dr. Marin Olp. Continue fentanyl patch. Outpatient follow-up on discharge   Hx portal/mesenteric vein thrombosis --Apixaban 5 mg p.o. twice daily   Hx hepatitis C, cirrhosis Underwent treatment for hepatitis B, prior history of EtOH abuse.  Outpatient follow-up with gastroenterology.   Anemia of chronic disease Hemoglobin 9.1, stable.   Hypokalemia: Resolved Repleted during hospitalization.  Discharge Diagnoses:  Principal Problem:   C. difficile colitis Active Problems:   Intractable generalized abdominal pain   Acute colitis   Hyponatremia   Portal vein thrombosis   Mesenteric vein thrombosis (HCC)   Malignant neoplasm of pancreas (HCC)   Type 2 diabetes mellitus with hyperglycemia, without long-term current use of insulin (HCC)   Essential hypertension   Cirrhosis of liver (HCC)   Pancreatic insufficiency   Mixed hyperlipidemia   Anemia of chronic disease   Acute diarrhea    Discharge Instructions  Discharge Instructions     Call MD for:  difficulty breathing, headache or  visual disturbances   Complete by: As directed  Call MD for:  extreme fatigue   Complete by: As directed    Call MD for:  persistant dizziness or light-headedness   Complete by: As directed    Call MD for:  persistant nausea and vomiting   Complete by: As directed    Call MD for:  severe uncontrolled pain   Complete by: As directed    Call MD for:  temperature >100.4   Complete by: As directed    Diet - low sodium heart healthy   Complete by: As directed    Increase activity slowly   Complete by: As directed       Allergies as of 04/05/2022       Reactions   Lisinopril Swelling, Other (See Comments)   Facial and upper lip        Medication List     TAKE these medications    ALPRAZolam 1 MG tablet Commonly known as: XANAX Take 0.5 tablets (0.5 mg total) by mouth every 8 (eight) hours as needed for anxiety.   amLODipine 10 MG tablet Commonly known as: NORVASC Take 1 tablet (10 mg total) by mouth at bedtime.   apixaban 5 MG Tabs tablet Commonly known as: Eliquis Take 1 tablet (5 mg total) by mouth 2 (two) times daily.   atorvastatin 20 MG tablet Commonly known as: LIPITOR Take 1 tablet (20 mg total) by mouth at bedtime.   Creon 36000 UNITS Cpep capsule Generic drug: lipase/protease/amylase TAKE 2 CAPSULES BY MOUTH THREE TIMES DAILY BEFORE MEAL(S) What changed: See the new instructions.   diphenoxylate-atropine 2.5-0.025 MG tablet Commonly known as: LOMOTIL Take 1 tablet by mouth 4 (four) times daily as needed for diarrhea or loose stools.   fentaNYL 25 MCG/HR Commonly known as: [place] 2 patches onto the skin every 3 (three) days. What changed: how much to take   fluconazole 100 MG tablet Commonly known as: DIFLUCAN Take 1 tablet (100 mg total) by mouth daily for 2 days.   gabapentin 400 MG capsule Commonly known as: NEURONTIN Take 1 capsule (400 mg total) by mouth 3 (three) times daily.   glipiZIDE 5 MG 24 hr tablet Commonly known as:  GLUCOTROL XL Take 1 tablet (5 mg total) by mouth daily with breakfast.   HYDROmorphone 4 MG tablet Commonly known as: Dilaudid Take 1 tablet (4 mg total) by mouth every 4 (four) hours as needed for severe pain.   loperamide 2 MG capsule Commonly known as: IMODIUM Take 2 capsules (4 mg total) by mouth 3 (three) times daily as needed for diarrhea or loose stools.   meclizine 25 MG tablet Commonly known as: ANTIVERT Take 25 mg by mouth 3 (three) times daily as needed for dizziness.   metFORMIN 750 MG 24 hr tablet Commonly known as: GLUCOPHAGE-XR Take 1 tablet by mouth once daily with breakfast   metroNIDAZOLE 500 MG tablet Commonly known as: FLAGYL Take 1 tablet (500 mg total) by mouth 3 (three) times daily for 3 days.   ondansetron 8 MG tablet Commonly known as: Zofran Take 1 tablet (8 mg total) by mouth 2 (two) times daily as needed (Nausea or vomiting).   pantoprazole 40 MG tablet Commonly known as: Protonix Take 1 tablet (40 mg total) by mouth daily.   saccharomyces boulardii 250 MG capsule Commonly known as: FLORASTOR Take 1 capsule (250 mg total) by mouth 2 (two) times daily for 2 days.   sodium chloride 1 g tablet Take 1 tablet (1 g total) by mouth 2 (two)  times daily with a meal.   sucralfate 1 g tablet Commonly known as: CARAFATE Take 1 tablet (1 g total) by mouth 2 (two) times daily.   vancomycin 125 MG capsule Commonly known as: VANCOCIN Take 1 capsule (125 mg total) by mouth 4 (four) times daily for 2 days.        Follow-up Information     Nche, Charlene Brooke, NP. Schedule an appointment as soon as possible for a visit in 1 week(s).   Specialty: Internal Medicine Contact information: [address] [place] [address] [phone]                Allergies  Allergen Reactions   Lisinopril Swelling and Other (See Comments)    Facial and upper lip    Consultations: Medical oncology, Dr. Marin Olp   Procedures/Studies: CT Abdomen Pelvis  W Contrast  Result Date: 03/27/2022 CLINICAL DATA:  History of pancreatic cancer, abdominal pain, nausea and vomiting, diarrhea EXAM: CT ABDOMEN AND PELVIS WITH CONTRAST TECHNIQUE: Multidetector CT imaging of the abdomen and pelvis was performed using the standard protocol following bolus administration of intravenous contrast. RADIATION DOSE REDUCTION: This exam was performed according to the departmental dose-optimization program which includes automated exposure control, adjustment of the mA and/or kV according to patient size and/or use of iterative reconstruction technique. CONTRAST:  143m OMNIPAQUE IOHEXOL 300 MG/ML  SOLN COMPARISON:  03/04/2022, 03/14/2022 FINDINGS: Lower chest: No acute pleural or parenchymal lung disease. Hepatobiliary: Stable position of a common bile duct stent, with interval resolution of the pneumobilia seen on prior study. There is mild to moderate intrahepatic biliary duct dilation. Gallbladder is minimally distended without cholelithiasis or cholecystitis. No focal liver abnormalities. Pancreas: Ill-defined calcified mass within the pancreatic head is again noted, consistent with known history of pancreatic malignancy. This measures approximately 5.1 x 4.4 cm, not appreciably changed. Marked upstream pancreatic duct dilation and pancreatic parenchymal atrophy unchanged. Spleen: Normal in size without focal abnormality. 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 Otherwise the kidneys are unremarkable. The adrenals are normal. Bladder is moderately distended without filling defect. Stomach/Bowel: Interval placement of duodenal stent, extending from the duodenal bulb through the ligament of Treitz. No bowel obstruction or ileus. There is mild wall thickening of the ascending colon, which could reflect inflammatory or infectious colitis. 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 Stable reconstitution of the main portal vein via extensive collaterals in the upper abdomen. Stable atherosclerosis. 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 Reproductive: Uterus and bilateral adnexa are unremarkable. Other: Trace free fluid within the paracolic gutters. No free intraperitoneal gas. No abdominal wall hernia. Musculoskeletal: No acute or destructive bony lesions. Reconstructed images demonstrate no additional findings. IMPRESSION: 1. Mild wall thickening of the ascending colon consistent with inflammatory or infectious colitis. 2. Interval placement of a duodenal stent. 3. Stable common bile duct stent, with interval resolution of pneumobilia. Stable dilation of the intrahepatic biliary ducts. 4. Stable partially calcified pancreatic mass consistent with known pancreatic malignancy. Stable subcentimeter celiac lymph nodes are concerning for nodal metastases. 5. Stable 1.4 cm lesion lower pole right kidney, previously characterized as renal cell carcinoma by MRI. 6. 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 Electronically Signed   By: MRanda NgoM.D.   On: 03/27/2022 19:02  DG Abd 2 Views  Result Date: 03/14/2022 CLINICAL DATA:  Duodenal stent placement EXAM: ABDOMEN - 2 VIEW COMPARISON:  CT abdomen and pelvis 03/04/2022 FINDINGS: Interval placement of a duodenal stent. The stent projects over the expected course of the duodenal sweep. Redemonstrated biliary stent. Nonobstructive bowel-gas pattern. Tubular marker near the GE junction. Large phleboliths versus fibroid pelvis. IMPRESSION: Interval placement of duodenal stent in expected position. Electronically Signed   By: Placido Sou M.D.   On: 03/14/2022 08:34   DG C-Arm 1-60 Min  Result Date: 03/12/2022 CLINICAL DATA:  Fluoroscopic assistance for placement of  stent EXAM: DG C-ARM 1-60 MIN FLUOROSCOPY: Fluoroscopy Time:  90 seconds Radiation Exposure Index (if provided by the fluoroscopic device): 16.5 Number of Acquired Spot Images: 3 COMPARISON:  CT done on 03/04/2022 FINDINGS: Theophilus Kinds is noted in place. In the early images, biliary stent is noted in the course of the common bile duct. In the final image, there is a new stent oriented transversely, possibly in pancreatic duct or bowel lumen. IMPRESSION: Fluoroscopic assistance was provided for stent placement. Electronically Signed   By: Elmer Picker M.D.   On: 03/12/2022 11:13     Subjective: Patient seen examined bedside, resting comfortably.  Sitting on couch in room eating breakfast.  Diarrhea much improved and ready for discharge home.  No other questions or concerns at this time.  Denies headache, no dizziness, no chest pain, no palpitations, no shortness of breath, no abdominal pain, no cough/congestion, no fever/chills/night sweats, no nausea/vomiting, no focal weakness, no fatigue, no paresthesias.  No acute events overnight per nursing staff.  Discharge Exam: Vitals:   04/04/22 2005 04/05/22 0539  BP: (!) 149/77 128/83  Pulse: 93 91  Resp: 17 18  Temp: 98.4 F (36.9 C) 98.4 F (36.9 C)  SpO2: 99% 99%   Vitals:   04/04/22 1237 04/04/22 2005 04/05/22 0500 04/05/22 0539  BP: (!) 144/76 (!) 149/77  128/83  Pulse: 85 93  91  Resp: '17 17  18  '$ Temp: 97.8 F (36.6 C) 98.4 F (36.9 C)  98.4 F (36.9 C)  TempSrc: Oral Oral  Oral  SpO2: 100% 99%  99%  Weight:   76.7 kg   Height:        Physical Exam: GEN: NAD, alert and oriented x 3, wd/wn HEENT: NCAT, PERRL, EOMI, sclera clear, MMM PULM: CTAB w/o wheezes/crackles, normal respiratory effort, on room air CV: RRR w/o M/G/R GI: abd soft, NTND, NABS, no R/G/M MSK: no peripheral edema, muscle strength globally intact 5/5 bilateral upper/lower extremities NEURO: CN II-XII intact, no focal deficits, sensation to light touch  intact PSYCH: normal mood/affect Integumentary: dry/intact, no rashes or wounds    The results of significant diagnostics from this hospitalization (including imaging, microbiology, ancillary and laboratory) are listed below for reference.     Microbiology: Recent Results (from the past 240 hour(s))  C Difficile Quick Screen w PCR reflex     Status: Abnormal   Collection Time: 03/28/22 12:27 PM   Specimen: STOOL  Result Value Ref Range Status   C Diff antigen POSITIVE (A) NEGATIVE Final    Comment: RESULT CALLED TO, READ BACK BY AND VERIFIED WITH: rn p armstrong at 1537 03/28/22 cruickshank a    C Diff toxin POSITIVE (A) NEGATIVE Final    Comment: RESULT CALLED TO, READ BACK BY AND VERIFIED WITH: rn p armstrong at 1537 03/28/22 cruickshank a    C Diff interpretation Toxin producing C. difficile detected.  Final  Comment: CRITICAL RESULT CALLED TO, READ BACK BY AND VERIFIED WITH: RN P ARMSTRONG AT 1537 03/28/22 CRUICKSHANK A Performed at [facility], [place] [address], [place], [place] [address]   Gastrointestinal Panel by PCR , Stool     Status: Abnormal   Collection Time: 03/28/22 12:27 PM   Specimen: Stool  Result Value Ref Range Status   Campylobacter species NOT DETECTED NOT DETECTED Final   Plesimonas shigelloides NOT DETECTED NOT DETECTED Final   Salmonella species NOT DETECTED NOT DETECTED Final   Yersinia enterocolitica NOT DETECTED NOT DETECTED Final   Vibrio species NOT DETECTED NOT DETECTED Final   Vibrio cholerae NOT DETECTED NOT DETECTED Final   Enteroaggregative E coli (EAEC) NOT DETECTED NOT DETECTED Final   Enteropathogenic E coli (EPEC) NOT DETECTED NOT DETECTED Final   Enterotoxigenic E coli (ETEC) NOT DETECTED NOT DETECTED Final   Shiga like toxin producing E coli (STEC) NOT DETECTED NOT DETECTED Final   Shigella/Enteroinvasive E coli (EIEC) NOT DETECTED NOT DETECTED Final   Cryptosporidium NOT DETECTED NOT DETECTED Final   Cyclospora  cayetanensis NOT DETECTED NOT DETECTED Final   Entamoeba histolytica NOT DETECTED NOT DETECTED Final   Giardia lamblia NOT DETECTED NOT DETECTED Final   Adenovirus F40/41 NOT DETECTED NOT DETECTED Final   Astrovirus NOT DETECTED NOT DETECTED Final   Norovirus GI/GII DETECTED (A) NOT DETECTED Final    Comment: RESULT CALLED TO, READ BACK BY AND VERIFIED WITH: SUSAN KUSNITZ AT 1139 03/29/22.PMF    Rotavirus A NOT DETECTED NOT DETECTED Final   Sapovirus (I, II, IV, and V) NOT DETECTED NOT DETECTED Final    Comment: Performed at [facility], [address] [place], [place] [address]     Labs: BNP (last 3 results) Recent Labs    01/15/22 1116 01/25/22 1213  BNP 242.1* 40.1   Basic Metabolic Panel: Recent Labs  Lab 03/30/22 1800 03/31/22 0955 04/01/22 0319 04/02/22 2213 04/05/22 0432  NA 133* 132* 135 134* 136  K 3.3* 3.2* 4.2 4.2 3.9  CL 104 103 105 103 101  CO2 '24 22 25 27 29  '$ GLUCOSE 159* 255* 110* 172* 119*  BUN 6* <5* 6* 7* 7*  CREATININE 0.51 0.60 0.50 0.50 0.57  CALCIUM 8.4* 8.3* 8.4* 8.2* 8.5*  MG  --   --  1.8  --   --    Liver Function Tests: Recent Labs  Lab 04/05/22 0432  AST 42*  ALT 23  ALKPHOS 131*  BILITOT 0.3  PROT 6.5  ALBUMIN 2.7*   No results for input(s): "LIPASE", "AMYLASE" in the last 168 hours. No results for input(s): "AMMONIA" in the last 168 hours. CBC: Recent Labs  Lab 03/30/22 1800 04/01/22 0319 04/02/22 2213 04/05/22 0432  WBC 8.1 8.8 8.9 11.0*  NEUTROABS  --   --   --  8.4*  HGB 9.2* 9.0* 9.1* 9.7*  HCT 28.3* 28.2* 28.8* 30.1*  MCV 88.7 89.0 88.9 89.6  PLT 300 296 306 363   Cardiac Enzymes: No results for input(s): "CKTOTAL", "CKMB", "CKMBINDEX", "TROPONINI" in the last 168 hours. BNP: Invalid input(s): "POCBNP" CBG: Recent Labs  Lab 04/04/22 0732 04/04/22 1234 04/04/22 1702 04/04/22 2028 04/05/22 0739  GLUCAP 147* 175* 226* 241* 128*   D-Dimer No results for input(s): "DDIMER" in the last 72  hours. Hgb A1c No results for input(s): "HGBA1C" in the last 72 hours. Lipid Profile No results for input(s): "CHOL", "HDL", "LDLCALC", "TRIG", "CHOLHDL", "LDLDIRECT" in the last 72 hours. Thyroid  function studies No results for input(s): "TSH", "T4TOTAL", "T3FREE", "THYROIDAB" in the last 72 hours.  Invalid input(s): "FREET3" Anemia work up No results for input(s): "VITAMINB12", "FOLATE", "FERRITIN", "TIBC", "IRON", "RETICCTPCT" in the last 72 hours. Urinalysis    Component Value Date/Time   COLORURINE STRAW (A) 03/27/2022 1655   APPEARANCEUR CLEAR 03/27/2022 1655   LABSPEC 1.003 (L) 03/27/2022 1655   PHURINE 5.0 03/27/2022 1655   GLUCOSEU NEGATIVE 03/27/2022 1655   HGBUR NEGATIVE 03/27/2022 1655   BILIRUBINUR NEGATIVE 03/27/2022 1655   BILIRUBINUR NEGATIVE 10/22/2018 1556   KETONESUR NEGATIVE 03/27/2022 1655   PROTEINUR NEGATIVE 03/27/2022 1655   UROBILINOGEN 0.2 10/22/2018 1556   NITRITE NEGATIVE 03/27/2022 1655   LEUKOCYTESUR NEGATIVE 03/27/2022 1655   Sepsis Labs Recent Labs  Lab 03/30/22 1800 04/01/22 0319 04/02/22 2213 04/05/22 0432  WBC 8.1 8.8 8.9 11.0*   Microbiology Recent Results (from the past 240 hour(s))  C Difficile Quick Screen w PCR reflex     Status: Abnormal   Collection Time: 03/28/22 12:27 PM   Specimen: STOOL  Result Value Ref Range Status   C Diff antigen POSITIVE (A) NEGATIVE Final    Comment: RESULT CALLED TO, READ BACK BY AND VERIFIED WITH: rn p armstrong at 1537 03/28/22 cruickshank a    C Diff toxin POSITIVE (A) NEGATIVE Final    Comment: RESULT CALLED TO, READ BACK BY AND VERIFIED WITH: rn p armstrong at 1537 03/28/22 cruickshank a    C Diff interpretation Toxin producing C. difficile detected.  Final    Comment: CRITICAL RESULT CALLED TO, READ BACK BY AND VERIFIED WITH: RN P ARMSTRONG AT 1537 03/28/22 CRUICKSHANK A Performed at [facility], [place] [address], [place], [place] [address]   Gastrointestinal Panel by PCR  , Stool     Status: Abnormal   Collection Time: 03/28/22 12:27 PM   Specimen: Stool  Result Value Ref Range Status   Campylobacter species NOT DETECTED NOT DETECTED Final   Plesimonas shigelloides NOT DETECTED NOT DETECTED Final   Salmonella species NOT DETECTED NOT DETECTED Final   Yersinia enterocolitica NOT DETECTED NOT DETECTED Final   Vibrio species NOT DETECTED NOT DETECTED Final   Vibrio cholerae NOT DETECTED NOT DETECTED Final   Enteroaggregative E coli (EAEC) NOT DETECTED NOT DETECTED Final   Enteropathogenic E coli (EPEC) NOT DETECTED NOT DETECTED Final   Enterotoxigenic E coli (ETEC) NOT DETECTED NOT DETECTED Final   Shiga like toxin producing E coli (STEC) NOT DETECTED NOT DETECTED Final   Shigella/Enteroinvasive E coli (EIEC) NOT DETECTED NOT DETECTED Final   Cryptosporidium NOT DETECTED NOT DETECTED Final   Cyclospora cayetanensis NOT DETECTED NOT DETECTED Final   Entamoeba histolytica NOT DETECTED NOT DETECTED Final   Giardia lamblia NOT DETECTED NOT DETECTED Final   Adenovirus F40/41 NOT DETECTED NOT DETECTED Final   Astrovirus NOT DETECTED NOT DETECTED Final   Norovirus GI/GII DETECTED (A) NOT DETECTED Final    Comment: RESULT CALLED TO, READ BACK BY AND VERIFIED WITH: SUSAN KUSNITZ AT 1139 03/29/22.PMF    Rotavirus A NOT DETECTED NOT DETECTED Final   Sapovirus (I, II, IV, and V) NOT DETECTED NOT DETECTED Final    Comment: Performed at [facility], [address] [place], [place] [address]     Time coordinating discharge: Over 30 minutes  SIGNED:   Rona Tomson J [place] DO  [facility] 04/05/2022, 10:10 AM

## 2022-04-06 ENCOUNTER — Other Ambulatory Visit: Payer: Self-pay

## 2022-04-08 ENCOUNTER — Telehealth: Payer: Self-pay

## 2022-04-08 NOTE — Telephone Encounter (Signed)
Transition Care Management Unsuccessful Follow-up Telephone Call  Date of discharge and from where:  04/05/2022   [facility]   Attempts:  1st Attempt  Reason for unsuccessful TCM follow-up call:  No answer/busy

## 2022-04-10 NOTE — Telephone Encounter (Signed)
/  Transition Care Management Unsuccessful Follow-up Telephone Call / Date of discharge and from where:  04/05/2022  [facility]   Attempts:  1st Attempt  Reason for unsuccessful TCM follow-up call:  No answer/busy

## 2022-04-12 ENCOUNTER — Other Ambulatory Visit: Payer: Self-pay | Admitting: Hematology & Oncology

## 2022-04-12 ENCOUNTER — Ambulatory Visit (INDEPENDENT_AMBULATORY_CARE_PROVIDER_SITE_OTHER): Payer: BC Managed Care – PPO | Admitting: Nurse Practitioner

## 2022-04-12 ENCOUNTER — Encounter: Payer: Self-pay | Admitting: Nurse Practitioner

## 2022-04-12 ENCOUNTER — Other Ambulatory Visit: Payer: Self-pay

## 2022-04-12 VITALS — BP 120/60 | HR 99 | Temp 96.5°F | Ht 63.5 in | Wt 146.8 lb

## 2022-04-12 DIAGNOSIS — C251 Malignant neoplasm of body of pancreas: Secondary | ICD-10-CM

## 2022-04-12 DIAGNOSIS — C772 Secondary and unspecified malignant neoplasm of intra-abdominal lymph nodes: Secondary | ICD-10-CM

## 2022-04-12 DIAGNOSIS — I8510 Secondary esophageal varices without bleeding: Secondary | ICD-10-CM | POA: Diagnosis not present

## 2022-04-12 DIAGNOSIS — E1165 Type 2 diabetes mellitus with hyperglycemia: Secondary | ICD-10-CM | POA: Diagnosis not present

## 2022-04-12 DIAGNOSIS — C259 Malignant neoplasm of pancreas, unspecified: Secondary | ICD-10-CM | POA: Diagnosis not present

## 2022-04-12 DIAGNOSIS — A0472 Enterocolitis due to Clostridium difficile, not specified as recurrent: Secondary | ICD-10-CM

## 2022-04-12 LAB — MICROALBUMIN / CREATININE URINE RATIO
Creatinine,U: 110.1 mg/dL
Microalb Creat Ratio: 2.9 mg/g (ref 0.0–30.0)
Microalb, Ur: 3.2 mg/dL — ABNORMAL HIGH (ref 0.0–1.9)

## 2022-04-12 MED ORDER — SUCRALFATE 1 G PO TABS: 1.0000 g | ORAL_TABLET | Freq: Two times a day (BID) | ORAL | 0 refills | Status: DC

## 2022-04-12 NOTE — Progress Notes (Signed)
Established Patient Visit  Patient: Melanie Alvarez   DOB: 10-24-1955   66 y.o. Female  MRN: [id number] Visit Date: 04/15/2022  Subjective:    Chief Complaint  Patient presents with   [facility] F/u from 03/27/22   Accompanied by son and grandson HPI C. difficile colitis Hospital stay 08/10-08/18 Resolved diarrhea, no ABD pain Stable appetite with creon. Denies any blood in stool Still take Vancomycin, flagyl x 10days for C. Diff colitis She denies any GI bleed Wt Readings from Last 3 Encounters:  04/12/22 146 lb 12.8 oz (66.6 kg)  04/05/22 169 lb 1.5 oz (76.7 kg)  03/08/22 150 lb (68 kg)    Type 2 diabetes mellitus with hyperglycemia, without long-term current use of insulin (HCC) Stable, last hgbA1c 5.7% (12/2021 Metformin discontinued due to pancreatic cancer with lets to liver Atorvastatin on hold due to liver mets and elevated LFTs. BP at goal with amlodipine. Current use of glipizide only Denies any hypoglycemia We reviewed waht consist of low carb/low sugar diet. She verbalized understanding. Provided printed information Urine microalbumin checked: normal  Secondary esophageal varices without bleeding (HCC) No GI bleed, no dysphagia   Reviewed medical, surgical, and social history today  Medications: Outpatient Medications Prior to Visit  Medication Sig   ALPRAZolam (XANAX) 1 MG tablet Take 0.5 tablets (0.5 mg total) by mouth every 8 (eight) hours as needed for anxiety.   amLODipine (NORVASC) 10 MG tablet Take 1 tablet (10 mg total) by mouth at bedtime.   apixaban (ELIQUIS) 5 MG TABS tablet Take 1 tablet (5 mg total) by mouth 2 (two) times daily.   CREON 36000-114000 units CPEP capsule TAKE 2 CAPSULES BY MOUTH THREE TIMES DAILY BEFORE MEAL(S) (Patient taking differently: Take 72,000 Units by mouth 3 (three) times daily before meals.)   fentaNYL (DURAGESIC) 25 MCG/HR Place 2 patches onto the skin every 3 (three) days. (Patient taking  differently: Place 1-2 patches onto the skin every 3 (three) days.)   gabapentin (NEURONTIN) 400 MG capsule Take 1 capsule (400 mg total) by mouth 3 (three) times daily.   glipiZIDE (GLUCOTROL XL) 5 MG 24 hr tablet Take 1 tablet (5 mg total) by mouth daily with breakfast.   HYDROmorphone (DILAUDID) 4 MG tablet Take 1 tablet (4 mg total) by mouth every 4 (four) hours as needed for severe pain.   loperamide (IMODIUM) 2 MG capsule Take 2 capsules (4 mg total) by mouth 3 (three) times daily as needed for diarrhea or loose stools.   meclizine (ANTIVERT) 25 MG tablet Take 25 mg by mouth 3 (three) times daily as needed for dizziness.   ondansetron (ZOFRAN) 8 MG tablet Take 1 tablet (8 mg total) by mouth 2 (two) times daily as needed (Nausea or vomiting).   pantoprazole (PROTONIX) 40 MG tablet Take 1 tablet (40 mg total) by mouth daily.   sodium chloride 1 g tablet Take 1 tablet (1 g total) by mouth 2 (two) times daily with a meal.   [DISCONTINUED] atorvastatin (LIPITOR) 20 MG tablet Take 1 tablet (20 mg total) by mouth at bedtime.   [DISCONTINUED] diphenoxylate-atropine (LOMOTIL) 2.5-0.025 MG tablet Take 1 tablet by mouth 4 (four) times daily as needed for diarrhea or loose stools.   [DISCONTINUED] sucralfate (CARAFATE) 1 g tablet Take 1 tablet (1 g total) by mouth 2 (two) times daily.   [DISCONTINUED] metFORMIN (GLUCOPHAGE-XR) 750 MG 24 hr tablet Take  1 tablet by mouth once daily with breakfast (Patient not taking: Reported on 04/12/2022)   No facility-administered medications prior to visit.   Reviewed past medical and social history.   ROS per HPI above  Last CBC Lab Results  Component Value Date   WBC 11.0 (H) 04/05/2022   HGB 9.7 (L) 04/05/2022   HCT 30.1 (L) 04/05/2022   MCV 89.6 04/05/2022   MCH 28.9 04/05/2022   RDW 15.6 (H) 04/05/2022   PLT 363 08/67/6195   Last metabolic panel Lab Results  Component Value Date   GLUCOSE 119 (H) 04/05/2022   NA 136 04/05/2022   K 3.9 04/05/2022    CL 101 04/05/2022   CO2 29 04/05/2022   BUN 7 (L) 04/05/2022   CREATININE 0.57 04/05/2022   GFRNONAA >60 04/05/2022   CALCIUM 8.5 (L) 04/05/2022   PHOS 4.2 03/18/2022   PROT 6.5 04/05/2022   ALBUMIN 2.7 (L) 04/05/2022   BILITOT 0.3 04/05/2022   ALKPHOS 131 (H) 04/05/2022   AST 42 (H) 04/05/2022   ALT 23 04/05/2022   ANIONGAP 6 04/05/2022   Last hemoglobin A1c Lab Results  Component Value Date   HGBA1C 5.7 (H) 03/08/2022        Objective:  BP 120/60 (BP Location: Right Arm, Patient Position: Sitting, Cuff Size: Small)   Pulse 99   Temp (!) 96.5 F (35.8 C) (Temporal)   Ht 5' 3.5" (1.613 m)   Wt 146 lb 12.8 oz (66.6 kg)   SpO2 99%   BMI 25.60 kg/m      Physical Exam Constitutional:      General: She is not in acute distress.    Appearance: She is cachectic.  Cardiovascular:     Rate and Rhythm: Normal rate and regular rhythm.     Pulses: Normal pulses.     Heart sounds: Normal heart sounds.  Pulmonary:     Effort: Pulmonary effort is normal.     Breath sounds: Normal breath sounds.  Abdominal:     General: Bowel sounds are normal. There is no distension.     Palpations: Abdomen is soft.     Tenderness: There is no abdominal tenderness.  Neurological:     Mental Status: She is alert and oriented to person, place, and time.     Results for orders placed or performed in visit on 04/12/22  Urine microalbumin-creatinine with uACR  Result Value Ref Range   Microalb, Ur 3.2 (H) 0.0 - 1.9 mg/dL   Creatinine,U 110.1 mg/dL   Microalb Creat Ratio 2.9 0.0 - 30.0 mg/g      Assessment & Plan:    Problem List Items Addressed This Visit       Cardiovascular and Mediastinum   Secondary esophageal varices without bleeding (HCC)    No GI bleed, no dysphagia        Digestive   C. difficile colitis - [facility] stay 08/10-08/18 Resolved diarrhea, no ABD pain Stable appetite with creon. Denies any blood in stool Still take Vancomycin, flagyl x 10days  for C. Diff colitis She denies any GI bleed Wt Readings from Last 3 Encounters:  04/12/22 146 lb 12.8 oz (66.6 kg)  04/05/22 169 lb 1.5 oz (76.7 kg)  03/08/22 150 lb (68 kg)         Endocrine   Type 2 diabetes mellitus with hyperglycemia, without long-term current use of insulin (HCC)    Stable, last hgbA1c 5.7% (12/2021 Metformin discontinued due to pancreatic cancer with  lets to liver Atorvastatin on hold due to liver mets and elevated LFTs. BP at goal with amlodipine. Current use of glipizide only Denies any hypoglycemia We reviewed waht consist of low carb/low sugar diet. She verbalized understanding. 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     Wilfred Lacy, NP

## 2022-04-12 NOTE — Patient Instructions (Addendum)
Maintain current medications. Go to lab for urine collection  DASH Eating Plan DASH stands for Dietary Approaches to Stop Hypertension. The DASH eating plan is a healthy eating plan that has been shown to: Reduce high blood pressure (hypertension). Reduce your risk for type 2 diabetes, heart disease, and stroke. Help with weight loss. What are tips for following this plan? Reading food labels Check food labels for the amount of salt (sodium) per serving. Choose foods with less than 5 percent of the Daily Value of sodium. Generally, foods with less than 300 milligrams (mg) of sodium per serving fit into this eating plan. To find whole grains, look for the word "whole" as the first word in the ingredient list. Shopping Buy products labeled as "low-sodium" or "no salt added." Buy fresh foods. Avoid canned foods and pre-made or frozen meals. Cooking Avoid adding salt when cooking. Use salt-free seasonings or herbs instead of table salt or sea salt. Check with your health care provider or pharmacist before using salt substitutes. Do not fry foods. Cook foods using healthy methods such as baking, boiling, grilling, roasting, and broiling instead. Cook with heart-healthy oils, such as olive, canola, avocado, soybean, or sunflower oil. Meal planning  Eat a balanced diet that includes: 4 or more servings of fruits and 4 or more servings of vegetables each day. Try to fill one-half of your plate with fruits and vegetables. 6-8 servings of whole grains each day. Less than 6 oz (170 g) of lean meat, poultry, or fish each day. A 3-oz (85-g) serving of meat is about the same size as a deck of cards. One egg equals 1 oz (28 g). 2-3 servings of low-fat dairy each day. One serving is 1 cup (237 mL). 1 serving of nuts, seeds, or beans 5 times each week. 2-3 servings of heart-healthy fats. Healthy fats called omega-3 fatty acids are found in foods such as walnuts, flaxseeds, fortified milks, and eggs. These  fats are also found in cold-water fish, such as sardines, salmon, and mackerel. Limit how much you eat of: Canned or prepackaged foods. Food that is high in trans fat, such as some fried foods. Food that is high in saturated fat, such as fatty meat. Desserts and other sweets, sugary drinks, and other foods with added sugar. Full-fat dairy products. Do not salt foods before eating. Do not eat more than 4 egg yolks a week. Try to eat at least 2 vegetarian meals a week. Eat more home-cooked food and less restaurant, buffet, and fast food. Lifestyle When eating at a restaurant, ask that your food be prepared with less salt or no salt, if possible. If you drink alcohol: Limit how much you use to: 0-1 drink a day for women who are not pregnant. 0-2 drinks a day for men. Be aware of how much alcohol is in your drink. In the [place] one drink equals one 12 oz bottle of beer (355 mL), one 5 oz glass of wine (148 mL), or one 1 oz glass of hard liquor (44 mL). General information Avoid eating more than 2,300 mg of salt a day. If you have hypertension, you may need to reduce your sodium intake to 1,500 mg a day. Work with your health care provider to maintain a healthy body weight or to lose weight. Ask what an ideal weight is for you. Get at least 30 minutes of exercise that causes your heart to beat faster (aerobic exercise) most days of the week. Activities may include walking, swimming, or  biking. Work with your health care provider or dietitian to adjust your eating plan to your individual calorie needs. What foods should I eat? Fruits All fresh, dried, or frozen fruit. Canned fruit in natural juice (without added sugar). Vegetables Fresh or frozen vegetables (raw, steamed, roasted, or grilled). Low-sodium or reduced-sodium tomato and vegetable juice. Low-sodium or reduced-sodium tomato sauce and tomato paste. Low-sodium or reduced-sodium canned vegetables. Grains Whole-grain or whole-wheat  bread. Whole-grain or whole-wheat pasta. Brown rice. [place]. Bulgur. Whole-grain and low-sodium cereals. Pita bread. Low-fat, low-sodium crackers. Whole-wheat flour tortillas. Meats and other proteins Skinless chicken or [place]. Ground chicken or [place]. Pork with fat trimmed off. Fish and seafood. Egg whites. Dried beans, peas, or lentils. Unsalted nuts, nut butters, and seeds. Unsalted canned beans. Lean cuts of beef with fat trimmed off. Low-sodium, lean precooked or cured meat, such as sausages or meat loaves. Dairy Low-fat (1%) or fat-free (skim) milk. Reduced-fat, low-fat, or fat-free cheeses. Nonfat, low-sodium ricotta or cottage cheese. Low-fat or nonfat yogurt. Low-fat, low-sodium cheese. Fats and oils Soft margarine without trans fats. Vegetable oil. Reduced-fat, low-fat, or light mayonnaise and salad dressings (reduced-sodium). Canola, safflower, olive, avocado, soybean, and sunflower oils. Avocado. Seasonings and condiments Herbs. Spices. Seasoning mixes without salt. Other foods Unsalted popcorn and pretzels. Fat-free sweets. The items listed above may not be a complete list of foods and beverages you can eat. Contact a dietitian for more information. What foods should I avoid? Fruits Canned fruit in a light or heavy syrup. Fried fruit. Fruit in cream or butter sauce. Vegetables Creamed or fried vegetables. Vegetables in a cheese sauce. Regular canned vegetables (not low-sodium or reduced-sodium). Regular canned tomato sauce and paste (not low-sodium or reduced-sodium). Regular tomato and vegetable juice (not low-sodium or reduced-sodium). Angie Fava. Olives. Grains Baked goods made with fat, such as croissants, muffins, or some breads. Dry pasta or rice meal packs. Meats and other proteins Fatty cuts of meat. Ribs. Fried meat. Berniece Salines. Bologna, salami, and other precooked or cured meats, such as sausages or meat loaves. Fat from the back of a pig (fatback). Bratwurst. Salted  nuts and seeds. Canned beans with added salt. Canned or smoked fish. Whole eggs or egg yolks. Chicken or [place] with skin. Dairy Whole or 2% milk, cream, and half-and-half. Whole or full-fat cream cheese. Whole-fat or sweetened yogurt. Full-fat cheese. Nondairy creamers. Whipped toppings. Processed cheese and cheese spreads. Fats and oils Butter. Stick margarine. Lard. Shortening. Ghee. Bacon fat. Tropical oils, such as coconut, palm kernel, or palm oil. Seasonings and condiments Onion salt, garlic salt, seasoned salt, table salt, and sea salt. Worcestershire sauce. Tartar sauce. Barbecue sauce. Teriyaki sauce. Soy sauce, including reduced-sodium. Steak sauce. Canned and packaged gravies. Fish sauce. Oyster sauce. Cocktail sauce. Store-bought horseradish. Ketchup. Mustard. Meat flavorings and tenderizers. Bouillon cubes. Hot sauces. Pre-made or packaged marinades. Pre-made or packaged taco seasonings. Relishes. Regular salad dressings. Other foods Salted popcorn and pretzels. The items listed above may not be a complete list of foods and beverages you should avoid. Contact a dietitian for more information. Where to find more information National Heart, Lung, and Blood Institute: https://wilson-eaton.com/ American Heart Association: www.heart.org Academy of Nutrition and Dietetics: www.eatright.[place]: www.kidney.org Summary The DASH eating plan is a healthy eating plan that has been shown to reduce high blood pressure (hypertension). It may also reduce your risk for type 2 diabetes, heart disease, and stroke. When on the DASH eating plan, aim to eat more fresh fruits and vegetables, whole grains,  lean proteins, low-fat dairy, and heart-healthy fats. With the DASH eating plan, you should limit salt (sodium) intake to 2,300 mg a day. If you have hypertension, you may need to reduce your sodium intake to 1,500 mg a day. Work with your health care provider or dietitian to adjust your  eating plan to your individual calorie needs. This information is not intended to replace advice given to you by your health care provider. Make sure you discuss any questions you have with your health care provider. Document Revised: 07/09/2019 Document Reviewed: 07/09/2019 Elsevier Patient Education  [place].

## 2022-04-15 ENCOUNTER — Encounter: Payer: Self-pay | Admitting: Nurse Practitioner

## 2022-04-15 ENCOUNTER — Other Ambulatory Visit: Payer: Self-pay | Admitting: Hematology & Oncology

## 2022-04-15 NOTE — Assessment & Plan Note (Signed)
No GI bleed, no dysphagia

## 2022-04-15 NOTE — Assessment & Plan Note (Addendum)
Hospital stay 08/10-08/18 Resolved diarrhea, no ABD pain Stable appetite with creon. Denies any blood in stool Still take Vancomycin, flagyl x 10days for C. Diff colitis She denies any GI bleed Wt Readings from Last 3 Encounters:  04/12/22 146 lb 12.8 oz (66.6 kg)  04/05/22 169 lb 1.5 oz (76.7 kg)  03/08/22 150 lb (68 kg)

## 2022-04-15 NOTE — Assessment & Plan Note (Addendum)
Stable, last hgbA1c 5.7% (12/2021 Metformin discontinued due to pancreatic cancer with lets to liver Atorvastatin on hold due to liver mets and elevated LFTs. BP at goal with amlodipine. Current use of glipizide only Denies any hypoglycemia We reviewed waht consist of low carb/low sugar diet. She verbalized understanding. Provided printed information Urine microalbumin checked: normal

## 2022-04-16 ENCOUNTER — Other Ambulatory Visit: Payer: Self-pay

## 2022-04-16 MED ORDER — HYDROMORPHONE HCL 4 MG PO TABS
4.0000 mg | ORAL_TABLET | ORAL | 0 refills | Status: DC | PRN
Start: 2022-04-16 — End: 2022-06-27

## 2022-04-17 ENCOUNTER — Other Ambulatory Visit: Payer: Self-pay

## 2022-04-17 ENCOUNTER — Telehealth: Payer: Self-pay

## 2022-04-17 ENCOUNTER — Ambulatory Visit (INDEPENDENT_AMBULATORY_CARE_PROVIDER_SITE_OTHER): Payer: BC Managed Care – PPO | Admitting: Gastroenterology

## 2022-04-17 ENCOUNTER — Encounter: Payer: Self-pay | Admitting: Gastroenterology

## 2022-04-17 ENCOUNTER — Ambulatory Visit (INDEPENDENT_AMBULATORY_CARE_PROVIDER_SITE_OTHER)
Admission: RE | Admit: 2022-04-17 | Discharge: 2022-04-17 | Disposition: A | Discharge status: Home / Self Care | Payer: BC Managed Care – PPO | Source: Ambulatory Visit | Attending: Gastroenterology | Admitting: Gastroenterology

## 2022-04-17 VITALS — BP 114/66 | HR 65 | Ht 63.5 in | Wt 141.0 lb

## 2022-04-17 DIAGNOSIS — K315 Obstruction of duodenum: Secondary | ICD-10-CM | POA: Diagnosis not present

## 2022-04-17 DIAGNOSIS — Z8507 Personal history of malignant neoplasm of pancreas: Secondary | ICD-10-CM | POA: Diagnosis not present

## 2022-04-17 DIAGNOSIS — R634 Abnormal weight loss: Secondary | ICD-10-CM

## 2022-04-17 DIAGNOSIS — G893 Neoplasm related pain (acute) (chronic): Secondary | ICD-10-CM | POA: Diagnosis not present

## 2022-04-17 DIAGNOSIS — Z8619 Personal history of other infectious and parasitic diseases: Secondary | ICD-10-CM

## 2022-04-17 MED ORDER — HYDROCODONE-ACETAMINOPHEN 5-325 MG PO TABS: 2.0000 | ORAL_TABLET | Freq: Four times a day (QID) | ORAL | 0 refills | Status: DC | PRN

## 2022-04-17 NOTE — Telephone Encounter (Signed)
Received call from pt stating that her pharmacy has to order Dilaudid. Pt requesting "just a few pills" of an alternative pain med for today. Norco routed to [facility] by Dr Marin Olp. Pt aware and verbalizes understanding. dph

## 2022-04-17 NOTE — Patient Instructions (Addendum)
_______________________________________________________  If you are age 65 or older, your body mass index should be between 23-30. Your Body mass index is 24.59 kg/m. If this is out of the aforementioned range listed, please consider follow up with your Primary Care Provider. ________________________________________________________  The [facility] providers would like to encourage you to use [facility] to communicate with providers for non-urgent requests or questions.  Due to long hold times on the telephone, sending your provider a message by [facility] may be a faster and more efficient way to get a response.  Please allow 48 business hours for a response.  Please remember that this is for non-urgent requests.  _______________________________________________________  Your provider has requested that you go to the basement level for an abdominal Xray before leaving today. Press "B" on the elevator. Xray is located straight ahead as you exit the elevator.  Due to recent changes in healthcare laws, you may see the results of your imaging and laboratory studies on MyChart before your provider has had a chance to review them.  We understand that in some cases there may be results that are confusing or concerning to you. Not all laboratory results come back in the same time frame and the provider may be waiting for multiple results in order to interpret others.  Please give [place] 48 hours in order for your provider to thoroughly review all the results before contacting the office for clarification of your results.   Please ask Dr Marin Olp about Gabapentin as adjunct for pain regimen.  If in the future pain control by mouth is not effective can consider Celiac Neurolysis.  Change Ensure to homemade protein shake.  Update [place] if bowels become more diarrhea like.  Thank you for entrusting me with your care and choosing [facility].  Dr Rush Landmark

## 2022-04-18 ENCOUNTER — Telehealth: Payer: Self-pay | Admitting: Licensed Clinical Social Worker

## 2022-04-18 ENCOUNTER — Inpatient Hospital Stay: Payer: BC Managed Care – PPO

## 2022-04-18 ENCOUNTER — Encounter: Payer: Self-pay | Admitting: Hematology & Oncology

## 2022-04-18 ENCOUNTER — Telehealth: Payer: Self-pay | Admitting: *Deleted

## 2022-04-18 ENCOUNTER — Inpatient Hospital Stay: Payer: BC Managed Care – PPO | Attending: Hematology & Oncology

## 2022-04-18 ENCOUNTER — Inpatient Hospital Stay (HOSPITAL_BASED_OUTPATIENT_CLINIC_OR_DEPARTMENT_OTHER): Payer: BC Managed Care – PPO | Admitting: Hematology & Oncology

## 2022-04-18 ENCOUNTER — Other Ambulatory Visit: Payer: Self-pay | Admitting: *Deleted

## 2022-04-18 VITALS — BP 103/69 | HR 94 | Temp 97.6°F | Resp 18 | Ht 63.3 in | Wt 140.4 lb

## 2022-04-18 DIAGNOSIS — Z95828 Presence of other vascular implants and grafts: Secondary | ICD-10-CM

## 2022-04-18 DIAGNOSIS — R197 Diarrhea, unspecified: Secondary | ICD-10-CM | POA: Diagnosis not present

## 2022-04-18 DIAGNOSIS — G8929 Other chronic pain: Secondary | ICD-10-CM | POA: Insufficient documentation

## 2022-04-18 DIAGNOSIS — I81 Portal vein thrombosis: Secondary | ICD-10-CM | POA: Insufficient documentation

## 2022-04-18 DIAGNOSIS — R1013 Epigastric pain: Secondary | ICD-10-CM

## 2022-04-18 DIAGNOSIS — C251 Malignant neoplasm of body of pancreas: Secondary | ICD-10-CM | POA: Diagnosis not present

## 2022-04-18 DIAGNOSIS — C259 Malignant neoplasm of pancreas, unspecified: Secondary | ICD-10-CM | POA: Diagnosis present

## 2022-04-18 DIAGNOSIS — Z7901 Long term (current) use of anticoagulants: Secondary | ICD-10-CM | POA: Diagnosis not present

## 2022-04-18 DIAGNOSIS — C250 Malignant neoplasm of head of pancreas: Secondary | ICD-10-CM

## 2022-04-18 LAB — CMP (CANCER CENTER ONLY)
ALT: 18 U/L (ref 0–44)
AST: 24 U/L (ref 15–41)
Albumin: 3.1 g/dL — ABNORMAL LOW (ref 3.5–5.0)
Alkaline Phosphatase: 154 U/L — ABNORMAL HIGH (ref 38–126)
Anion gap: 8 (ref 5–15)
BUN: 5 mg/dL — ABNORMAL LOW (ref 8–23)
CO2: 28 mmol/L (ref 22–32)
Calcium: 9 mg/dL (ref 8.9–10.3)
Chloride: 97 mmol/L — ABNORMAL LOW (ref 98–111)
Creatinine: 0.66 mg/dL (ref 0.44–1.00)
GFR, Estimated: >60 mL/min (ref >60)
Glucose, Bld: 228 mg/dL — ABNORMAL HIGH (ref 70–99)
Potassium: 2.7 mmol/L — CL (ref 3.5–5.1)
Sodium: 133 mmol/L — ABNORMAL LOW (ref 135–145)
Total Bilirubin: 0.5 mg/dL (ref 0.3–1.2)
Total Protein: 7.4 g/dL (ref 6.5–8.1)

## 2022-04-18 LAB — CBC WITH DIFFERENTIAL (CANCER CENTER ONLY)
Abs Immature Granulocytes: 0.18 10*3/uL — ABNORMAL HIGH (ref 0.00–0.07)
Basophils Absolute: 0 10*3/uL (ref 0.0–0.1)
Basophils Relative: 1 %
Eosinophils Absolute: 0 10*3/uL (ref 0.0–0.5)
Eosinophils Relative: 1 %
HCT: 30.9 % — ABNORMAL LOW (ref 36.0–46.0)
Hemoglobin: 10 g/dL — ABNORMAL LOW (ref 12.0–15.0)
Immature Granulocytes: 4 %
Lymphocytes Relative: 12 %
Lymphs Abs: 0.5 10*3/uL — ABNORMAL LOW (ref 0.7–4.0)
MCH: 28.5 pg (ref 26.0–34.0)
MCHC: 32.4 g/dL (ref 30.0–36.0)
MCV: 88 fL (ref 80.0–100.0)
Monocytes Absolute: 0.2 10*3/uL (ref 0.1–1.0)
Monocytes Relative: 5 %
Neutro Abs: 3.2 10*3/uL (ref 1.7–7.7)
Neutrophils Relative %: 77 %
Platelet Count: 418 10*3/uL — ABNORMAL HIGH (ref 150–400)
RBC: 3.51 MIL/uL — ABNORMAL LOW (ref 3.87–5.11)
RDW: 13.8 % (ref 11.5–15.5)
WBC Count: 4.1 10*3/uL (ref 4.0–10.5)
nRBC: 0 % (ref 0.0–0.2)

## 2022-04-18 MED ORDER — DIPHENOXYLATE-ATROPINE 2.5-0.025 MG PO TABS: 2.0000 | ORAL_TABLET | Freq: Four times a day (QID) | ORAL | 0 refills | Status: DC | PRN

## 2022-04-18 MED ORDER — POTASSIUM CHLORIDE 20 MEQ PO PACK: 40.0000 meq | PACK | Freq: Three times a day (TID) | ORAL | 5 refills | Status: DC

## 2022-04-18 MED ORDER — GABAPENTIN 400 MG PO CAPS: 400.0000 mg | ORAL_CAPSULE | Freq: Two times a day (BID) | ORAL | 2 refills | Status: AC

## 2022-04-18 MED ORDER — PANCRELIPASE (LIP-PROT-AMYL) 36000-114000 UNITS PO CPEP: 72000.0000 [IU] | ORAL_CAPSULE | Freq: Four times a day (QID) | ORAL | 4 refills | Status: AC

## 2022-04-18 MED ORDER — SODIUM CHLORIDE 0.9% FLUSH
10.0000 mL | Freq: Once | INTRAVENOUS | Status: AC
  Administered 2022-04-18: 10 mL via INTRAVENOUS

## 2022-04-18 MED ORDER — HEPARIN SOD (PORK) LOCK FLUSH 100 UNIT/ML IV SOLN
500.0000 [IU] | Freq: Once | INTRAVENOUS | Status: AC
  Administered 2022-04-18: 500 [IU] via INTRAVENOUS

## 2022-04-18 NOTE — Telephone Encounter (Signed)
Dr. Marin Olp notified of potassium-2.7.  No new orders received at this time.

## 2022-04-18 NOTE — Progress Notes (Signed)
Hematology and Oncology Follow Up Visit  Melanie Alvarez [id number] [birth year] 66 y.o. 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 twice daily-started on 12/24/2021 Abraxane/Gemzar --start cycle #1 on 04/24/2022   Interim History:  Melanie Alvarez is here today for follow-up.  It has been quite a while since we last saw her in the office.  She unfortunately, has been in the hospital with various problems.  Her most recent problem has been the fact that she has had partial bowel obstruction.  This was from her pancreatic recurrence.  We were able to give her 1 dose of Abraxane/Gemzar back in May.  She had a really hard time with this.  In the hospital, she did have a duodenal stent placed which is helped.  She has had chronic pain issues.  She did have a celiac block that was done.  This may have helped a little bit.  We tried her on a Duragesic patch.  She cannot tolerate the adhesive.  She currently is on hydromorphone which does seem to help a little bit.  There is still some issues with diarrhea.  Her potassium is quite low.  I will call in some potassium chloride crystals.  Hopefully she can tolerate this.  Everything that we do clearly is all about quality of life.  I does want her to be able to enjoy her life and be able to do what she would like to do.  Hopefully, she will to eat a little bit better if we can get her tumor shrunk.  Currently, I would say that her performance status is probably ECOG 2.   Medications:  Allergies as of 04/18/2022       Reactions   Lisinopril Swelling, Other (See Comments)   Facial and upper lip        Medication List        Accurate as of April 18, 2022 11:27 AM. If you have any  questions, ask your nurse or doctor.          ALPRAZolam 1 MG tablet Commonly known as: XANAX Take 0.5 tablets (0.5 mg total) by mouth every 8 (eight) hours as needed for anxiety.   amLODipine 10 MG tablet Commonly known as: NORVASC Take 1 tablet (10 mg total) by mouth at bedtime.   apixaban 5 MG Tabs tablet Commonly known as: Eliquis Take 1 tablet (5 mg total) by mouth 2 (two) times daily.   Creon 36000 UNITS Cpep capsule Generic drug: lipase/protease/amylase TAKE 2 CAPSULES BY MOUTH THREE TIMES DAILY BEFORE MEAL(S) What changed: See the new instructions.   diphenoxylate-atropine 2.5-0.025 MG tablet Commonly known as: LOMOTIL TAKE 1 TABLET BY MOUTH 4 TIMES DAILY AS NEEDED FOR DIARRHEA OR LOOSE STOOLS   fentaNYL 25 MCG/HR Commonly known as: [place] 2 patches onto the skin every 3 (three) days.   gabapentin 400 MG capsule Commonly known as: NEURONTIN Take 1 capsule (400 mg total) by mouth 3 (three) times daily.   glipiZIDE 5 MG 24 hr tablet Commonly known as: GLUCOTROL XL Take 1 tablet (5 mg total) by mouth daily with breakfast.   HYDROcodone-acetaminophen 5-325 MG tablet Commonly known as: Norco Take 2 tablets by mouth every 6 (six) hours as needed for moderate pain.  HYDROmorphone 4 MG tablet Commonly known as: Dilaudid Take 1 tablet (4 mg total) by mouth every 4 (four) hours as needed for severe pain.   loperamide 2 MG capsule Commonly known as: IMODIUM Take 2 capsules (4 mg total) by mouth 3 (three) times daily as needed for diarrhea or loose stools.   meclizine 25 MG tablet Commonly known as: ANTIVERT Take 25 mg by mouth 3 (three) times daily as needed for dizziness.   ondansetron 8 MG tablet Commonly known as: Zofran Take 1 tablet (8 mg total) by mouth 2 (two) times daily as needed (Nausea or vomiting).   pantoprazole 40 MG tablet Commonly known as: Protonix Take 1 tablet (40 mg total) by mouth daily.   sodium chloride 1 g tablet Take 1  tablet (1 g total) by mouth 2 (two) times daily with a meal.   sucralfate 1 g tablet Commonly known as: CARAFATE Take 1 tablet (1 g total) by mouth 2 (two) times daily.        Allergies:  Allergies  Allergen Reactions   Lisinopril Swelling and Other (See Comments)    Facial and upper lip    Past Medical History, Surgical history, Social history, and Family History were reviewed and updated.  Review of Systems: Review of Systems  Constitutional: Negative.   HENT: Negative.    Eyes: Negative.   Respiratory: Negative.    Cardiovascular: Negative.   Gastrointestinal: Negative.   Genitourinary: Negative.   Musculoskeletal: Negative.   Skin: Negative.   Neurological: Negative.   Endo/Heme/Allergies: Negative.   Psychiatric/Behavioral: Negative.        Physical Exam:  height is 5' 3.3" (1.608 m) and weight is 140 lb 6.4 oz (63.7 kg). Her oral temperature is 97.6 F (36.4 C). Her blood pressure is 103/69 and her pulse is 94. Her respiration is 18 and oxygen saturation is 100%.   Wt Readings from Last 3 Encounters:  04/18/22 140 lb 6.4 oz (63.7 kg)  04/17/22 141 lb (64 kg)  04/12/22 146 lb 12.8 oz (66.6 kg)    Physical Exam Vitals reviewed.  HENT:     Alvarez: Normocephalic and atraumatic.  Eyes:     Pupils: Pupils are equal, round, and reactive to light.  Cardiovascular:     Rate and Rhythm: Normal rate and regular rhythm.     Heart sounds: Normal heart sounds.  Pulmonary:     Effort: Pulmonary effort is normal.     Breath sounds: Normal breath sounds.  Abdominal:     General: Bowel sounds are normal.     Palpations: Abdomen is soft.     Comments: Abdominal exam is slightly distended.  There is some tenderness to palpation.  Bowel sounds are decreased.  She has no obvious fluid wave.  There is no obvious abdominal mass.  I cannot palpate her liver or spleen.  Musculoskeletal:        General: No tenderness or deformity. Normal range of motion.     Cervical back:  Normal range of motion.     Comments: Extremities shows decreased range of motion of the left shoulder.  There is decreased abduction.  She has very little rotation of the left shoulder.  Lymphadenopathy:     Cervical: No cervical adenopathy.  Skin:    General: Skin is warm and dry.     Findings: No erythema or rash.  Neurological:     Mental Status: She is alert and oriented to person, place, and time.  Psychiatric:  Behavior: Behavior normal.        Thought Content: Thought content normal.        Judgment: Judgment normal.     Lab Results  Component Value Date   WBC 4.1 04/18/2022   HGB 10.0 (L) 04/18/2022   HCT 30.9 (L) 04/18/2022   MCV 88.0 04/18/2022   PLT 418 (H) 04/18/2022   Lab Results  Component Value Date   FERRITIN 38 03/08/2022   IRON 38 03/08/2022   TIBC 247 (L) 03/08/2022   UIBC NOT CALCULATED 03/08/2022   IRONPCTSAT NOT CALCULATED 03/08/2022   Lab Results  Component Value Date   RETICCTPCT 1.9 03/08/2022   RBC 3.51 (L) 04/18/2022   No results found for: "KPAFRELGTCHN", "LAMBDASER", "KAPLAMBRATIO" No results found for: "IGGSERUM", "IGA", "IGMSERUM" No results found for: "TOTALPROTELP", "ALBUMINELP", "A1GS", "A2GS", "BETS", "BETA2SER", "GAMS", "MSPIKE", "SPEI"   Chemistry      Component Value Date/Time   NA 133 (L) 04/18/2022 0950   K 2.7 (LL) 04/18/2022 0950   CL 97 (L) 04/18/2022 0950   CO2 28 04/18/2022 0950   BUN 5 (L) 04/18/2022 0950   CREATININE 0.66 04/18/2022 0950   CREATININE 0.78 03/23/2018 1018      Component Value Date/Time   CALCIUM 9.0 04/18/2022 0950   ALKPHOS 154 (H) 04/18/2022 0950   AST 24 04/18/2022 0950   ALT 18 04/18/2022 0950   ALT 40 (H) 11/25/2017 0947   BILITOT 0.5 04/18/2022 0950       Impression and Plan: Ms. Monteith is a very pleasant 66 yo African American female with adenocarcinoma of the pancreas, ampullary carcinoma.  She received upfront chemotherapy and radiation therapy.  She did not want any  surgery.  She now has recurrent disease.  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  I think she is on Eliquis for this.  We will try her on the Gemzar/Abraxane.  I think this is some that we should consider again.  We may have to do a dosage adjustment to see if she can tolerate this all bit better.  Again, this is all about quality of life.  I wish her quality would be a little better.  Again if we can shrink the tumor, then she should be able to eat a little bit better and maybe have less pain.  Surprising, the tumor has not yet spread to the liver that we can see.  We will try to get treatment started in a week or so.  I would like to see her back myself when she starts her second cycle of treatment.  We probably will give her 3 cycles of treatment and then repeat her CT scan to see if there is any tumor shrinkage.   Volanda Napoleon, MD 8/31/202311:27 AM

## 2022-04-18 NOTE — Telephone Encounter (Signed)
[place] Work  Clinical Social Work was referred by nurse for questions about home care options.  Clinical Social Worker attempted to contact patient by phone  to offer support and assess for needs.  No answer. Left VM with direct contact information.      [place], [place] Worker [employer]

## 2022-04-18 NOTE — Progress Notes (Signed)
[facility] - Pancreatic Adenocarcinoma  No Change  Continue With Treatment as Ordered.  Original Decision Date/Time: 12/24/2021 16:57     A cycle is every 28 days:     Nab-paclitaxel (protein bound)      Gemcitabine   **Always confirm dose/schedule in your pharmacy ordering system**  Patient Characteristics: Locally Advanced, Anatomically Unresectable, M0, Second Line, MSS/pMMR or MSI Unknown, Fluoropyrimidine-Based Therapy  First Line Therapeutic Status: Locally Advanced, Anatomically Unresectable, M0 Line of Therapy: Second Line Microsatellite/Mismatch Repair Status: MSS/pMMR Intent of Therapy: Non-Curative / Palliative Intent, Discussed with Patient

## 2022-04-18 NOTE — Patient Instructions (Signed)

## 2022-04-19 ENCOUNTER — Inpatient Hospital Stay: Payer: BC Managed Care – PPO | Admitting: Nurse Practitioner

## 2022-04-19 ENCOUNTER — Other Ambulatory Visit: Payer: Self-pay | Admitting: *Deleted

## 2022-04-19 ENCOUNTER — Inpatient Hospital Stay: Payer: BC Managed Care – PPO | Attending: Licensed Clinical Social Worker | Admitting: Licensed Clinical Social Worker

## 2022-04-19 DIAGNOSIS — Z5111 Encounter for antineoplastic chemotherapy: Secondary | ICD-10-CM | POA: Insufficient documentation

## 2022-04-19 DIAGNOSIS — C259 Malignant neoplasm of pancreas, unspecified: Secondary | ICD-10-CM | POA: Insufficient documentation

## 2022-04-19 MED ORDER — MECLIZINE HCL 25 MG PO TABS: 25.0000 mg | ORAL_TABLET | Freq: Three times a day (TID) | ORAL | 1 refills | Status: DC | PRN

## 2022-04-19 NOTE — Progress Notes (Signed)
[place] CSW Progress Note  Clinical Education officer, museum contacted patient by phone to follow-up on request for home care. Per pt, she is looking for someone to help with ADLs in the morning until her husband is able to assist in the afternoon while she is regaining her strength. CSW reviewed options which are primarily self-pay. Pt agreed to referral to [facility] to help explore options.   Referral placed to Whitney B with [facility]. She will contact pt today    Lorraine Terriquez E Edwing Figley, LCSW

## 2022-04-20 ENCOUNTER — Other Ambulatory Visit: Payer: Self-pay

## 2022-04-20 ENCOUNTER — Encounter: Payer: Self-pay | Admitting: Gastroenterology

## 2022-04-20 DIAGNOSIS — R634 Abnormal weight loss: Secondary | ICD-10-CM | POA: Insufficient documentation

## 2022-04-20 DIAGNOSIS — G893 Neoplasm related pain (acute) (chronic): Secondary | ICD-10-CM | POA: Insufficient documentation

## 2022-04-20 DIAGNOSIS — Z8619 Personal history of other infectious and parasitic diseases: Secondary | ICD-10-CM | POA: Insufficient documentation

## 2022-04-20 DIAGNOSIS — K315 Obstruction of duodenum: Secondary | ICD-10-CM | POA: Insufficient documentation

## 2022-04-20 NOTE — Progress Notes (Signed)
[place] VISIT   Primary Care Provider Nche, Charlene Brooke, NP [place] [address] [phone]  Patient Profile: Melanie Alvarez is a 66 y.o. female with a pmh significant for hypertension, obesity,  diabetes, anxiety, diverticulosis, pancreatic cancer (complicated by obstruction status post ERCP, complicated by duodenal obstruction status post enteral stenting, complicated by chronic pain status post celiac blockade), C. difficile recently.  The patient presents to the [facility] for an evaluation and management of problem(s) noted below:  Problem List 1. History of pancreatic cancer   2. Duodenal stricture   3. Unintentional weight loss   4. Chronic pain due to neoplasm   5. History of Clostridium difficile infection     History of Present Illness Please see prior notes for full details of HPI.  Interval History During a recent long hospitalization my partners asked me to help with duodenal obstruction from her known pancreas cancer and I placed an enteral stent.  This improved her symptoms of nausea and vomiting and inability to tolerate oral intake.  She remained in the hospital due to significant pain and discomfort and we were able to improve her discomfort somewhat through a celiac blockade.  She was able to be discharged.  Unfortunately she returned to the hospital with C. difficile infection and had to be treated.  She is now able to eat what she wants though she has a very minimal appetite.  Nothing is coming up.  She is still is experiencing abdominal pain and has had some medication adjustments by Dr. Marin Olp and she is seeing him tomorrow for follow-up.  Weight loss continues to be an issue for her.  She does not tolerate ensures.  She did tolerate previous protein shakes being made by her family and thinks that may be helpful.  She is hopeful to consider further therapies with Dr. Marin Olp when she  meets with him tomorrow.  Her bowels are still different than her normal but she is not having the looseness of stool as she was experiencing when she had the C. difficile diagnosed.  GI Review of Systems Positive as above including chronic abdominal pain (better controlled on her current oral regimen after recent celiac block) Negative for dysphagia, odynophagia, melena, hematochezia  Review of Systems General: Denies fevers/chills Cardiovascular: Denies chest pain/palpitations Pulmonary: Denies shortness of breath Gastroenterological: See HPI Genitourinary: Denies darkened urine  Dermatological: Denies jaundice Psychological: Mood is stable and helpful   Medications Current Outpatient Medications  Medication Sig Dispense Refill   ALPRAZolam (XANAX) 1 MG tablet Take 0.5 tablets (0.5 mg total) by mouth every 8 (eight) hours as needed for anxiety. 30 tablet 0   amLODipine (NORVASC) 10 MG tablet Take 1 tablet (10 mg total) by mouth at bedtime. 90 tablet 3   apixaban (ELIQUIS) 5 MG TABS tablet Take 1 tablet (5 mg total) by mouth 2 (two) times daily. 60 tablet 2   glipiZIDE (GLUCOTROL XL) 5 MG 24 hr tablet Take 1 tablet (5 mg total) by mouth daily with breakfast. 90 tablet 1   HYDROmorphone (DILAUDID) 4 MG tablet Take 1 tablet (4 mg total) by mouth every 4 (four) hours as needed for severe pain. 90 tablet 0   loperamide (IMODIUM) 2 MG capsule Take 2 capsules (4 mg total) by mouth 3 (three) times daily as needed for diarrhea or loose stools. 30 capsule 0   ondansetron (ZOFRAN) 8 MG tablet Take 1 tablet (8 mg total) by mouth 2 (two) times  daily as needed (Nausea or vomiting). 30 tablet 1   pantoprazole (PROTONIX) 40 MG tablet Take 1 tablet (40 mg total) by mouth daily. 30 tablet 5   sucralfate (CARAFATE) 1 g tablet Take 1 tablet (1 g total) by mouth 2 (two) times daily. (Patient not taking: Reported on 04/18/2022) 40 tablet 0   diphenoxylate-atropine (LOMOTIL) 2.5-0.025 MG tablet Take 2 tablets  by mouth 4 (four) times daily as needed for diarrhea or loose stools. 100 tablet 0   gabapentin (NEURONTIN) 400 MG capsule Take 1 capsule (400 mg total) by mouth 2 (two) times daily. 60 capsule 2   lipase/protease/amylase (CREON) 36000 UNITS CPEP capsule Take 2 capsules (72,000 Units total) by mouth in the morning, at noon, in the evening, and at bedtime. 240 capsule 4   meclizine (ANTIVERT) 25 MG tablet Take 1 tablet (25 mg total) by mouth 3 (three) times daily as needed for dizziness. 30 tablet 1   potassium chloride (KLOR-CON) 20 MEQ packet Take 40 mEq by mouth 3 (three) times daily. 120 packet 5   No current facility-administered medications for this visit.    Allergies Allergies  Allergen Reactions   Lisinopril Swelling and Other (See Comments)    Facial and upper lip    Histories Past Medical History:  Diagnosis Date   Acute colitis 03/27/2022   Cancer ([place])    Cirrhosis ([place])    Diabetes mellitus 2012   Diverticulosis    Gallbladder sludge    Hepatitis 2010   history of Hepatitis C   Hepatitis C    Hypertension 2011   Obesity    Past Surgical History:  Procedure Laterality Date   BILIARY BRUSHING  09/22/2019   Procedure: BILIARY BRUSHING;  Surgeon: Irving Copas., MD;  Location: [facility];  Service: Gastroenterology;;   BILIARY DILATION  09/22/2019   Procedure: BILIARY DILATION;  Surgeon: Irving Copas., MD;  Location: [facility];  Service: Gastroenterology;;   BILIARY STENT PLACEMENT N/A 09/22/2019   Procedure: BILIARY STENT PLACEMENT;  Surgeon: Irving Copas., MD;  Location: [facility];  Service: Gastroenterology;  Laterality: N/A;   COLONOSCOPY  07/24/2011   Procedure: COLONOSCOPY;  Surgeon: Landry Dyke, MD;  Location: [facility];  Service: Endoscopy;  Laterality: N/A;   Dental Surgery Tooth Extraction     DUODENAL STENT PLACEMENT N/A 03/12/2022   Procedure: DUODENAL STENT PLACEMENT;  Surgeon: Rush Landmark Telford Nab., MD;  Location:  [facility];  Service: Gastroenterology;  Laterality: N/A;   ERCP N/A 09/22/2019   Procedure: ENDOSCOPIC RETROGRADE CHOLANGIOPANCREATOGRAPHY (ERCP);  Surgeon: Irving Copas., MD;  Location: [facility];  Service: Gastroenterology;  Laterality: N/A;   ESOPHAGOGASTRODUODENOSCOPY (EGD) WITH PROPOFOL N/A 09/22/2019   Procedure: ESOPHAGOGASTRODUODENOSCOPY (EGD) WITH PROPOFOL;  Surgeon: Rush Landmark Telford Nab., MD;  Location: [facility];  Service: Gastroenterology;  Laterality: N/A;   ESOPHAGOGASTRODUODENOSCOPY (EGD) WITH PROPOFOL N/A 09/25/2019   Procedure: ESOPHAGOGASTRODUODENOSCOPY (EGD) WITH PROPOFOL;  Surgeon: Rush Landmark Telford Nab., MD;  Location: [facility];  Service: Gastroenterology;  Laterality: N/A;   ESOPHAGOGASTRODUODENOSCOPY (EGD) WITH PROPOFOL N/A 01/19/2020   Procedure: ESOPHAGOGASTRODUODENOSCOPY (EGD) WITH PROPOFOL;  Surgeon: Rush Landmark Telford Nab., MD;  Location: [facility];  Service: Gastroenterology;  Laterality: N/A;   ESOPHAGOGASTRODUODENOSCOPY (EGD) WITH PROPOFOL N/A 03/08/2022   Procedure: ESOPHAGOGASTRODUODENOSCOPY (EGD) WITH PROPOFOL;  Surgeon: Ladene Artist, MD;  Location: [facility];  Service: Gastroenterology;  Laterality: N/A;   ESOPHAGOGASTRODUODENOSCOPY (EGD) WITH PROPOFOL N/A 03/12/2022   Procedure: ESOPHAGOGASTRODUODENOSCOPY (EGD) WITH PROPOFOL;  Surgeon: Rush Landmark Telford Nab., MD;  Location: [facility];  Service: Gastroenterology;  Laterality: N/A;  placement of duodenal stent   ESOPHAGOGASTRODUODENOSCOPY (EGD) WITH PROPOFOL N/A 03/22/2022   Procedure: ESOPHAGOGASTRODUODENOSCOPY (EGD) WITH PROPOFOL;  Surgeon: Rush Landmark Telford Nab., MD;  Location: [facility];  Service: Gastroenterology;  Laterality: N/A;   EUS N/A 09/22/2019   Procedure: UPPER ENDOSCOPIC ULTRASOUND (EUS) RADIAL;  Surgeon: Irving Copas., MD;  Location: [facility];  Service: Gastroenterology;  Laterality: N/A;   EUS N/A 01/19/2020   Procedure: UPPER ENDOSCOPIC ULTRASOUND (EUS)  RADIAL;  Surgeon: Irving Copas., MD;  Location: [facility];  Service: Gastroenterology;  Laterality: N/A;   FIDUCIAL MARKER PLACEMENT N/A 01/19/2020   Procedure: FIDUCIAL MARKER PLACEMENT;  Surgeon: Irving Copas., MD;  Location: [facility];  Service: Gastroenterology;  Laterality: N/A;   FINE NEEDLE ASPIRATION  09/22/2019   Procedure: FINE NEEDLE ASPIRATION (FNA) LINEAR;  Surgeon: Irving Copas., MD;  Location: [facility];  Service: Gastroenterology;;   FINE NEEDLE ASPIRATION  09/25/2019   Procedure: FINE NEEDLE ASPIRATION (FNA) RADIAL;  Surgeon: Irving Copas., MD;  Location: [facility];  Service: Gastroenterology;;   HEMOSTASIS CLIP PLACEMENT  09/25/2019   Procedure: HEMOSTASIS CLIP PLACEMENT;  Surgeon: Irving Copas., MD;  Location: [facility];  Service: Gastroenterology;;   HOT HEMOSTASIS N/A 03/22/2022   Procedure: HOT HEMOSTASIS (ARGON PLASMA COAGULATION/BICAP);  Surgeon: Irving Copas., MD;  Location: [facility];  Service: Gastroenterology;  Laterality: N/A;   IR CATHETER TUBE CHANGE  01/31/2022   IR CHOLANGIOGRAM EXISTING TUBE  02/11/2022   IR IMAGING GUIDED PORT INSERTION  09/24/2019   IR IMAGING GUIDED PORT INSERTION  12/31/2021   IR RADIOLOGIST EVAL & MGMT  01/30/2022   IR RADIOLOGIST EVAL & MGMT  02/14/2022   IR REMOVAL TUN ACCESS W/ PORT W/O FL MOD SED  06/16/2020   NEUROLYTIC CELIAC PLEXUS  03/22/2022   Procedure: NEUROLYTIC CELIAC PLEXUS;  Surgeon: Irving Copas., MD;  Location: [facility];  Service: Gastroenterology;;   Joan Mayans  09/22/2019   Procedure: Joan Mayans;  Surgeon: Irving Copas., MD;  Location: [facility];  Service: Gastroenterology;;   UPPER ESOPHAGEAL ENDOSCOPIC ULTRASOUND (EUS) N/A 09/25/2019   Procedure: UPPER ESOPHAGEAL ENDOSCOPIC ULTRASOUND (EUS);  Surgeon: Irving Copas., MD;  Location: [facility];  Service: Gastroenterology;  Laterality: N/A;   UPPER ESOPHAGEAL ENDOSCOPIC  ULTRASOUND (EUS) N/A 03/22/2022   Procedure: UPPER ESOPHAGEAL ENDOSCOPIC ULTRASOUND (EUS);  Surgeon: Irving Copas., MD;  Location: [facility];  Service: Gastroenterology;  Laterality: N/A;  Linear Scope   Social History   Socioeconomic History   Marital status: Married    Spouse name: Not on file   Number of children: Not on file   Years of education: Not on file   Highest education level: Not on file  Occupational History   Not on file  Tobacco Use   Smoking status: Former    Packs/day: 0.25    Types: Cigarettes    Quit date: 09/20/2019    Years since quitting: 2.5   Smokeless tobacco: Never  Vaping Use   Vaping Use: Never used  Substance and Sexual Activity   Alcohol use: Not Currently    Comment: quit 2022   Drug use: No    Comment: previously    Sexual activity: Yes    Partners: Male    Birth control/protection: Post-menopausal  Other Topics Concern   Not on file  Social History Narrative   Previous [facility] pt.  Last MD was Dr. Delman Cheadle, seen 12/2011  Works part time in [employer] with husband   Social Determinants of Radio broadcast assistant Strain: Not on [employer] Insecurity: Not on file  Transportation Needs: Not on file  Physical Activity: Not on file  Stress: Not on file  Social Connections: Not on file  Intimate Partner Violence: Not on file   Family History  Problem Relation Age of Onset   Diabetes Sister    Hypertension Sister    Hypertension Brother    Cancer Maternal Grandmother        leukemia    Diabetes Maternal Grandfather    Diabetes Paternal Grandmother    Diabetes Paternal Grandfather    Anesthesia problems Neg Hx    Colon cancer Neg Hx    Esophageal cancer Neg Hx    Inflammatory bowel disease Neg Hx    Liver disease Neg Hx    Pancreatic cancer Neg Hx    Rectal cancer Neg Hx    Stomach cancer Neg Hx    I have reviewed her medical, social, and family history in detail and updated  the electronic medical record as necessary.    PHYSICAL EXAMINATION  BP 114/66   Pulse 65   Ht 5' 3.5" (1.613 m)   Wt 141 lb (64 kg)   SpO2 98%   BMI 24.59 kg/m  Wt Readings from Last 3 Encounters:  04/18/22 140 lb 6.4 oz (63.7 kg)  04/17/22 141 lb (64 kg)  04/12/22 146 lb 12.8 oz (66.6 kg)  GEN: NAD, appears stated age, doesn't appear chronically ill, accompanied by family friend PSYCH: Cooperative, without pressured speech EYE: Conjunctivae pink, sclerae anicteric ENT: MMM CV: Nontachycardic RESP: No audible wheezing GI: NABS, soft, rounded, TTP throughout abdomen upon deep palpation, no rebound MSK/EXT: Trace bilateral pedal edema SKIN: No jaundice NEURO:  Alert & Oriented x 3, no focal deficits   REVIEW OF DATA  I reviewed the following data at the time of this encounter:  GI Procedures and Studies  April 2023 EUS EGD Impression: - No gross lesions in esophagus. Z-line irregular, 35 cm from the incisors. - An endoclip was found at the cardia. - Erythematous mucosa in the stomach - previously biopsied and negative for HP. - One gastric AVM was ablated due to patient's anemia with APC (gastric settings). - Metal stent in the duodenum - previously placed (has jailed the biliary stent). EUS Impression: - Celiac plexus block performed.  July 2023 EGD - No gross lesions in esophagus. Z-line irregular, 35 cm from the incisors. - Retained gastric fluid - suctioned ~200cc. - Erythematous mucosa in the stomach. - Erythematous duodenopathy in bulb. - Metal biliary stent at ampullary region. Duodenal erosions without bleeding on contralateral wall. 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 22 mm x 9 cm UCSEMS placed. We have covered the previously placed biliary stent with our new duodenal stent. - No gross lesions in the fourth portion of the duodenum.  Laboratory Studies  Reviewed those in epic  Imaging Studies  March 27, 2022 CT abdomen pelvis with  contrast IMPRESSION: 1. Mild wall thickening of the ascending colon consistent with inflammatory or infectious colitis. 2. Interval placement of a duodenal stent. 3. Stable common bile duct stent, with interval resolution of pneumobilia. Stable dilation of the intrahepatic biliary ducts. 4. Stable partially calcified pancreatic mass consistent with known pancreatic malignancy. Stable subcentimeter celiac lymph nodes are concerning for nodal metastases. 5. Stable 1.4 cm lesion lower  pole right kidney, previously characterized as renal cell carcinoma by MRI. 6. 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   ASSESSMENT  Ms. Kazanjian is a 66 y.o. female with a pmh significant for hypertension, obesity,  diabetes, anxiety, diverticulosis, pancreatic cancer (complicated by obstruction status post ERCP, complicated by duodenal obstruction status post enteral stenting, complicated by chronic pain status post celiac blockade), C. difficile recently.  The patient is seen today for evaluation and management of:  1. History of pancreatic cancer   2. Duodenal stricture   3. Unintentional weight loss   4. Chronic pain due to neoplasm   5. History of Clostridium difficile infection    The patient is hemodynamically stable.  Certainly she is experienced quite a lot over the course of the last 6 weeks.  I am thankful that the enteral stent has been helpful in her obstructive symptoms.  I do think the celiac block has been somewhat helpful to try to get her out of the hospital and also try to get her on a more comfortable oral pain regimen.  We did jail off the CBD stent but thankfully her liver tests have been stable.  If she would develop obstructive jaundice, accessing the biliary tree more may be more difficult but may still need to be considered before PBD placement.  Hopefully she is able to continue to  have decent pain control but if things progress or worsen we could consider another celiac block first the role of celiac neurolysis.  There is a slightly increased risk for infection as well as paralysis that can occur with neurolysis but having underlying pancreas cancer certainly is something that may allow discussion of this in the future if she runs out of other options (seems she does not tolerate fentanyl patches well).  In regards to her continued unintentional weight loss, do not have a clear sense of this because her obstruction seems to be improved.  We are going to repeat an x-ray just to ensure that there is been no stent migration.  Her recent C. difficile infection also has probably caused her issues as well thankfully she has completed her treatment hopefully she will not have any worsening of symptoms.  It may be few weeks to few months as she may have some irritable bowel after her C. difficile.  She will be at risk of SIBO as well and so may need to consider the role of SIBO breath testing and/or empiric treatment of SIBO if her bowel habits do not continue to have clinical improvement.  Time will tell.  We will continue her oral pancreatic enzyme replacement therapy, she may have room for up titration if necessary as well.  All patient questions were answered to the best of my ability, and the patient agrees to the aforementioned plan of action with follow-up as indicated.   PLAN  Proceed with KUB Creon - 72,000 with each meal - 36,000 with each snack Consider homemade protein shakes to try and increase calories and protein intake Repeat celiac block versus celiac neurolysis may be a consideration in future if oral pain regimens are not able to control her pain Follow-up with Dr. Marin Olp to discuss potential other therapies she may be accessible for SIBO breath testing may need to be considered if her bowel habits remain altered after her C. difficile infection   Orders Placed This  Encounter  Procedures  DG Abd 2 Views    New Prescriptions   POTASSIUM CHLORIDE (KLOR-CON) 20 MEQ PACKET    Take 40 mEq by mouth 3 (three) times daily.   Modified Medications   Modified Medication Previous Medication   DIPHENOXYLATE-ATROPINE (LOMOTIL) 2.5-0.025 MG TABLET diphenoxylate-atropine (LOMOTIL) 2.5-0.025 MG tablet      Take 2 tablets by mouth 4 (four) times daily as needed for diarrhea or loose stools.    TAKE 1 TABLET BY MOUTH 4 TIMES DAILY AS NEEDED FOR DIARRHEA OR LOOSE STOOLS   GABAPENTIN (NEURONTIN) 400 MG CAPSULE gabapentin (NEURONTIN) 400 MG capsule      Take 1 capsule (400 mg total) by mouth 2 (two) times daily.    Take 1 capsule (400 mg total) by mouth 3 (three) times daily.   LIPASE/PROTEASE/AMYLASE (CREON) 36000 UNITS CPEP CAPSULE CREON 36000-114000 units CPEP capsule      Take 2 capsules (72,000 Units total) by mouth in the morning, at noon, in the evening, and at bedtime.    TAKE 2 CAPSULES BY MOUTH THREE TIMES DAILY BEFORE MEAL(S)   MECLIZINE (ANTIVERT) 25 MG TABLET meclizine (ANTIVERT) 25 MG tablet      Take 1 tablet (25 mg total) by mouth 3 (three) times daily as needed for dizziness.    Take 25 mg by mouth 3 (three) times daily as needed for dizziness.    Planned Follow Up No follow-ups on file.   Total Time in Face-to-Face and in Coordination of Care for patient including independent/personal interpretation/review of prior testing, medical history, examination, medication adjustment, communicating results with the patient directly, and documentation within the EHR is 25 minutes.  Justice Britain, MD [facility] Office # [phone]

## 2022-04-25 ENCOUNTER — Inpatient Hospital Stay: Payer: BC Managed Care – PPO

## 2022-04-25 ENCOUNTER — Telehealth: Payer: Self-pay | Admitting: Dietician

## 2022-04-25 VITALS — BP 114/73 | HR 101 | Temp 99.1°F | Resp 17

## 2022-04-25 DIAGNOSIS — C251 Malignant neoplasm of body of pancreas: Secondary | ICD-10-CM

## 2022-04-25 DIAGNOSIS — C259 Malignant neoplasm of pancreas, unspecified: Secondary | ICD-10-CM | POA: Diagnosis present

## 2022-04-25 DIAGNOSIS — Z5111 Encounter for antineoplastic chemotherapy: Secondary | ICD-10-CM | POA: Diagnosis present

## 2022-04-25 LAB — CMP (CANCER CENTER ONLY)
ALT: 18 U/L (ref 0–44)
AST: 28 U/L (ref 15–41)
Albumin: 3.2 g/dL — ABNORMAL LOW (ref 3.5–5.0)
Alkaline Phosphatase: 233 U/L — ABNORMAL HIGH (ref 38–126)
Anion gap: 7 (ref 5–15)
BUN: 5 mg/dL — ABNORMAL LOW (ref 8–23)
CO2: 25 mmol/L (ref 22–32)
Calcium: 9.3 mg/dL (ref 8.9–10.3)
Chloride: 93 mmol/L — ABNORMAL LOW (ref 98–111)
Creatinine: 0.69 mg/dL (ref 0.44–1.00)
GFR, Estimated: >60 mL/min (ref >60)
Glucose, Bld: 172 mg/dL — ABNORMAL HIGH (ref 70–99)
Potassium: 4.8 mmol/L (ref 3.5–5.1)
Sodium: 125 mmol/L — ABNORMAL LOW (ref 135–145)
Total Bilirubin: 0.7 mg/dL (ref 0.3–1.2)
Total Protein: 7.7 g/dL (ref 6.5–8.1)

## 2022-04-25 LAB — CBC WITH DIFFERENTIAL (CANCER CENTER ONLY)
Abs Immature Granulocytes: 0.04 10*3/uL (ref 0.00–0.07)
Basophils Absolute: 0.1 10*3/uL (ref 0.0–0.1)
Basophils Relative: 1 %
Eosinophils Absolute: 0 10*3/uL (ref 0.0–0.5)
Eosinophils Relative: 0 %
HCT: 35.1 % — ABNORMAL LOW (ref 36.0–46.0)
Hemoglobin: 11.7 g/dL — ABNORMAL LOW (ref 12.0–15.0)
Immature Granulocytes: 0 %
Lymphocytes Relative: 7 %
Lymphs Abs: 0.8 10*3/uL (ref 0.7–4.0)
MCH: 28.7 pg (ref 26.0–34.0)
MCHC: 33.3 g/dL (ref 30.0–36.0)
MCV: 86.2 fL (ref 80.0–100.0)
Monocytes Absolute: 0.7 10*3/uL (ref 0.1–1.0)
Monocytes Relative: 5 %
Neutro Abs: 10.9 10*3/uL — ABNORMAL HIGH (ref 1.7–7.7)
Neutrophils Relative %: 87 %
Platelet Count: 431 10*3/uL — ABNORMAL HIGH (ref 150–400)
RBC: 4.07 MIL/uL (ref 3.87–5.11)
RDW: 13.2 % (ref 11.5–15.5)
WBC Count: 12.5 10*3/uL — ABNORMAL HIGH (ref 4.0–10.5)
nRBC: 0 % (ref 0.0–0.2)

## 2022-04-25 MED ORDER — SODIUM CHLORIDE 0.9 % IV SOLN
800.0000 mg/m2 | Freq: Once | INTRAVENOUS | Status: AC
  Administered 2022-04-25: 1368 mg via INTRAVENOUS
  Filled 2022-04-25: qty 26.3

## 2022-04-25 MED ORDER — SODIUM CHLORIDE 0.9 % IV SOLN: Freq: Once | INTRAVENOUS | Status: DC

## 2022-04-25 MED ORDER — HEPARIN SOD (PORK) LOCK FLUSH 100 UNIT/ML IV SOLN
500.0000 [IU] | Freq: Once | INTRAVENOUS | Status: AC | PRN
  Administered 2022-04-25: 500 [IU]

## 2022-04-25 MED ORDER — SODIUM CHLORIDE 0.9 % IV SOLN: Freq: Once | INTRAVENOUS | Status: AC

## 2022-04-25 MED ORDER — SODIUM CHLORIDE 0.9% FLUSH: 3.0000 mL | INTRAVENOUS | Status: DC | PRN

## 2022-04-25 MED ORDER — HYDROMORPHONE HCL 1 MG/ML IJ SOLN
2.0000 mg | Freq: Once | INTRAMUSCULAR | Status: AC
  Administered 2022-04-25: 2 mg via INTRAVENOUS
  Filled 2022-04-25: qty 2

## 2022-04-25 MED ORDER — PACLITAXEL PROTEIN-BOUND CHEMO INJECTION 100 MG
100.0000 mg/m2 | Freq: Once | INTRAVENOUS | Status: AC
  Administered 2022-04-25: 175 mg via INTRAVENOUS
  Filled 2022-04-25: qty 35

## 2022-04-25 MED ORDER — PROCHLORPERAZINE MALEATE 10 MG PO TABS
10.0000 mg | ORAL_TABLET | Freq: Once | ORAL | Status: AC
  Administered 2022-04-25: 10 mg via ORAL
  Filled 2022-04-25: qty 1

## 2022-04-25 MED ORDER — SODIUM CHLORIDE 0.9% FLUSH
10.0000 mL | INTRAVENOUS | Status: DC | PRN
  Administered 2022-04-25: 10 mL

## 2022-04-25 NOTE — Patient Instructions (Signed)

## 2022-04-25 NOTE — Progress Notes (Signed)
OK to treat with today's lab values per Dr. Ennever. 

## 2022-04-25 NOTE — Telephone Encounter (Signed)
Patient called from her cell phone to state she was very tired and she would prefer to reschedule nutrition consult for Tuesday.  April Manson, RDN, LDN Registered Dietitian, [place] Part Time Remote (Usual office hours: Tuesday-Thursday) Mobile: [phone] Remote Office: [phone]

## 2022-04-25 NOTE — Telephone Encounter (Signed)
Patient screened on MST. First attempt to reach.  I called cell  She was at chemo and asked that I try calling back to her home # after 4pm. Provided my cell# in text so she had my contact information.  April Manson, RDN, LDN Registered Dietitian, [place] Part Time Remote (Usual office hours: Tuesday-Thursday) Cell: [phone]

## 2022-04-27 ENCOUNTER — Encounter: Payer: Self-pay | Admitting: Hematology & Oncology

## 2022-04-30 ENCOUNTER — Other Ambulatory Visit: Payer: Self-pay | Admitting: Nurse Practitioner

## 2022-04-30 ENCOUNTER — Ambulatory Visit: Payer: BC Managed Care – PPO | Admitting: Dietician

## 2022-04-30 DIAGNOSIS — E782 Mixed hyperlipidemia: Secondary | ICD-10-CM

## 2022-04-30 NOTE — Progress Notes (Signed)
Nutrition Assessment: Reached out to patient at home telephone number.  Brief attempt to  speak with patient as she was heading out with her husband.    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  She is being followed with Dr. Marin Olp with goal of better QOL.   She reports she's been trying to eat but everything comes back up.  She was recently hospitalized with partial bowel obstruction.  Her PMHx also includes DM2, Cirrhosis, HTN, Duodenal obstruction, biliary obstruction.  Her nutrition impact symptoms are positive for nausea, taste changes, diarrhea.  Not tolerating creamy ONS, but [place] Yogurt stays down with fruit.  Food intollerances Breakfast: Oatmeal doesn't stay down, bananas hurt her stomach... Lunch dinner: Fish, baked or broiled can't seem to keep down.   Nutrition Focused Physical Exam: unable to perform NFPE   Medications: Encouraged her to speak with MD about anti nausea meds   Labs: 04/25/22 Na 125, (she reports cardiologist told her not to use any salt), Glucose 172, Albumin 3.2, Alk Phos 233   Anthropometrics:  weight loss 49# (26%) past 6 months  Height: 63.5" Weight:  04/18/22  140.4# 03/08/22  150# 11/27/21  189#  BMI: 24.64   Estimated Energy Needs  Kcals: 0813-8871 Protein: 76-128 Fluid: 2L plus loss with diarrhea   NUTRITION DIAGNOSIS: inadequate PO intake for increased needs r/t to her nausea, taste chanes AEB significant weight loss and reported PO intake.   MALNUTRITION DIAGNOSIS: Suspect severe malnutrition of chronic disease with reported PO and weight loss but unable to perform NFPE.   INTERVENTION:   Educated on importance of adequate calorie and protein energy intake  with nutrient dense foods when possible to maintain weight/strength Encouraged small frequent meals/snacks  using bland foods-  Suggested trial of Ensure Clear oral nutrition supplement will see if there are samples available.  Emailed Nutrition Tip sheet  for  Nutrition During Cancer Treatment, nausea and diarrhea with contact information.   MONITORING, EVALUATION, GOAL: weight trends, nutrition impact symptoms, PO intake, labs   Next Visit: remote, next week  April Manson, RDN, LDN Registered Dietitian, [place] (Usual office hours: Tuesday-Thursday) Mobile: [phone] Remote Office: [phone]

## 2022-05-02 ENCOUNTER — Telehealth: Payer: Self-pay | Admitting: Gastroenterology

## 2022-05-02 ENCOUNTER — Telehealth: Payer: Self-pay

## 2022-05-02 ENCOUNTER — Inpatient Hospital Stay: Payer: BC Managed Care – PPO

## 2022-05-02 VITALS — BP 155/90 | HR 87 | Resp 17

## 2022-05-02 DIAGNOSIS — C251 Malignant neoplasm of body of pancreas: Secondary | ICD-10-CM

## 2022-05-02 DIAGNOSIS — D649 Anemia, unspecified: Secondary | ICD-10-CM

## 2022-05-02 DIAGNOSIS — Z5111 Encounter for antineoplastic chemotherapy: Secondary | ICD-10-CM | POA: Diagnosis not present

## 2022-05-02 DIAGNOSIS — R1013 Epigastric pain: Secondary | ICD-10-CM

## 2022-05-02 DIAGNOSIS — C250 Malignant neoplasm of head of pancreas: Secondary | ICD-10-CM

## 2022-05-02 LAB — CMP (CANCER CENTER ONLY)
ALT: 15 U/L (ref 0–44)
AST: 23 U/L (ref 15–41)
Albumin: 3 g/dL — ABNORMAL LOW (ref 3.5–5.0)
Alkaline Phosphatase: 211 U/L — ABNORMAL HIGH (ref 38–126)
Anion gap: 7 (ref 5–15)
BUN: <5 mg/dL — ABNORMAL LOW (ref 8–23)
CO2: 26 mmol/L (ref 22–32)
Calcium: 9.1 mg/dL (ref 8.9–10.3)
Chloride: 98 mmol/L (ref 98–111)
Creatinine: 0.59 mg/dL (ref 0.44–1.00)
GFR, Estimated: >60 mL/min (ref >60)
Glucose, Bld: 219 mg/dL — ABNORMAL HIGH (ref 70–99)
Potassium: 3.2 mmol/L — ABNORMAL LOW (ref 3.5–5.1)
Sodium: 131 mmol/L — ABNORMAL LOW (ref 135–145)
Total Bilirubin: 0.5 mg/dL (ref 0.3–1.2)
Total Protein: 7.4 g/dL (ref 6.5–8.1)

## 2022-05-02 LAB — CBC WITH DIFFERENTIAL (CANCER CENTER ONLY)
Abs Immature Granulocytes: 0.02 10*3/uL (ref 0.00–0.07)
Basophils Absolute: 0 10*3/uL (ref 0.0–0.1)
Basophils Relative: 1 %
Eosinophils Absolute: 0 10*3/uL (ref 0.0–0.5)
Eosinophils Relative: 0 %
HCT: 29.5 % — ABNORMAL LOW (ref 36.0–46.0)
Hemoglobin: 9.9 g/dL — ABNORMAL LOW (ref 12.0–15.0)
Immature Granulocytes: 1 %
Lymphocytes Relative: 18 %
Lymphs Abs: 0.8 10*3/uL (ref 0.7–4.0)
MCH: 28.9 pg (ref 26.0–34.0)
MCHC: 33.6 g/dL (ref 30.0–36.0)
MCV: 86 fL (ref 80.0–100.0)
Monocytes Absolute: 0.1 10*3/uL (ref 0.1–1.0)
Monocytes Relative: 3 %
Neutro Abs: 3.3 10*3/uL (ref 1.7–7.7)
Neutrophils Relative %: 77 %
Platelet Count: 167 10*3/uL (ref 150–400)
RBC: 3.43 MIL/uL — ABNORMAL LOW (ref 3.87–5.11)
RDW: 12.8 % (ref 11.5–15.5)
WBC Count: 4.3 10*3/uL (ref 4.0–10.5)
nRBC: 0 % (ref 0.0–0.2)

## 2022-05-02 MED ORDER — SODIUM CHLORIDE 0.9 % IV SOLN: Freq: Once | INTRAVENOUS | Status: AC

## 2022-05-02 MED ORDER — PACLITAXEL PROTEIN-BOUND CHEMO INJECTION 100 MG
100.0000 mg/m2 | Freq: Once | INTRAVENOUS | Status: AC
  Administered 2022-05-02: 175 mg via INTRAVENOUS
  Filled 2022-05-02: qty 35

## 2022-05-02 MED ORDER — HEPARIN SOD (PORK) LOCK FLUSH 100 UNIT/ML IV SOLN
500.0000 [IU] | Freq: Once | INTRAVENOUS | Status: AC | PRN
  Administered 2022-05-02: 500 [IU]

## 2022-05-02 MED ORDER — SODIUM CHLORIDE 0.9 % IV SOLN: Freq: Once | INTRAVENOUS | Status: DC

## 2022-05-02 MED ORDER — SODIUM CHLORIDE 0.9% FLUSH
10.0000 mL | INTRAVENOUS | Status: DC | PRN
  Administered 2022-05-02: 10 mL

## 2022-05-02 MED ORDER — SODIUM CHLORIDE 0.9 % IV SOLN
800.0000 mg/m2 | Freq: Once | INTRAVENOUS | Status: AC
  Administered 2022-05-02: 1368 mg via INTRAVENOUS
  Filled 2022-05-02: qty 26.3

## 2022-05-02 MED ORDER — PROCHLORPERAZINE MALEATE 10 MG PO TABS
10.0000 mg | ORAL_TABLET | Freq: Once | ORAL | Status: AC
  Administered 2022-05-02: 10 mg via ORAL
  Filled 2022-05-02: qty 1

## 2022-05-02 MED ORDER — POTASSIUM CHLORIDE CRYS ER 20 MEQ PO TBCR: 40.0000 meq | EXTENDED_RELEASE_TABLET | Freq: Two times a day (BID) | ORAL | 3 refills | Status: DC

## 2022-05-02 NOTE — Telephone Encounter (Signed)
PT is requesting to take potassium tablets instead of powder. Pt states the powder runs right through her. Per Dr Marin Olp ok to send in Klor-Con 20 meq 2 tablets BID. Sent to requested pharmacy.

## 2022-05-02 NOTE — Telephone Encounter (Signed)
Dr Rush Landmark the pt wanted to make you aware of her symptoms of diarrhea and weight loss.  She says she has lost 6 pounds.  She is taking imodium 2 capsules three times daily.  She has an appt on 05/31/22 with Estill Bamberg.

## 2022-05-02 NOTE — Telephone Encounter (Signed)
Inbound call from patient states she is experiencing chronic diarrhea and she has lost about 6lbs. She made an appt with an APP on 10/13 but wanted Dr. Rush Landmark to know of her symptoms. Please advise.

## 2022-05-02 NOTE — Patient Instructions (Signed)
[facility]  Discharge Instructions: Thank you for choosing [facility] to provide your oncology and hematology care.   If you have a lab appointment with the [facility], please go directly to the [facility] and check in at the registration area.  Wear comfortable clothing and clothing appropriate for easy access to any Portacath or PICC line.   We strive to give you quality time with your provider. You may need to reschedule your appointment if you arrive late (15 or more minutes).  Arriving late affects you and other patients whose appointments are after yours.  Also, if you miss three or more appointments without notifying the office, you may be dismissed from the clinic at the provider's discretion.      For prescription refill requests, have your pharmacy contact our office and allow 72 hours for refills to be completed.    Today you received the following chemotherapy and/or immunotherapy agents Abraxane, Gemzar.      To help prevent nausea and vomiting after your treatment, we encourage you to take your nausea medication as directed.  BELOW ARE SYMPTOMS THAT SHOULD BE REPORTED IMMEDIATELY: *FEVER GREATER THAN 100.4 F (38 C) OR HIGHER *CHILLS OR SWEATING *NAUSEA AND VOMITING THAT IS NOT CONTROLLED WITH YOUR NAUSEA MEDICATION *UNUSUAL SHORTNESS OF BREATH *UNUSUAL BRUISING OR BLEEDING *URINARY PROBLEMS (pain or burning when urinating, or frequent urination) *BOWEL PROBLEMS (unusual diarrhea, constipation, pain near the anus) TENDERNESS IN MOUTH AND THROAT WITH OR WITHOUT PRESENCE OF ULCERS (sore throat, sores in mouth, or a toothache) UNUSUAL RASH, SWELLING OR PAIN  UNUSUAL VAGINAL DISCHARGE OR ITCHING   Items with * indicate a potential emergency and should be followed up as soon as possible or go to the Emergency Department if any problems should occur.  Please show the CHEMOTHERAPY ALERT CARD or IMMUNOTHERAPY ALERT CARD at check-in  to the Emergency Department and triage nurse. Should you have questions after your visit or need to cancel or reschedule your appointment, please contact [facility]  [phone] and follow the prompts.  Office hours are 8:00 a.m. to 4:30 p.m. Monday - Friday. Please note that voicemails left after 4:00 p.m. may not be returned until the following business day.  We are closed weekends and major holidays. You have access to a nurse at all times for urgent questions. Please call the main number to the clinic [phone] and follow the prompts.  For any non-urgent questions, you may also contact your provider using MyChart. We now offer e-Visits for anyone 28 and older to request care online for non-urgent symptoms. For details visit [email or web address].   Also download the MyChart app! Go to the app store, search "MyChart", open the app, select [facility], and log in with your MyChart username and password.  Masks are optional in the cancer centers. If you would like for your care team to wear a mask while they are taking care of you, please let them know. You may have one support person who is at least 65 years old accompany you for your appointments.

## 2022-05-03 ENCOUNTER — Other Ambulatory Visit: Payer: Self-pay

## 2022-05-03 DIAGNOSIS — R197 Diarrhea, unspecified: Secondary | ICD-10-CM

## 2022-05-03 NOTE — Telephone Encounter (Signed)
Patty, I am sorry to hear this. She should have a Lomotil prescription as well as I can see in her medications.  Is she taking that? She has a history of recent C. difficile so I would send a GI pathogen panel, C. difficile PCR/GDH antigen/toxin as well as a fecal calprotectin stool study to see if this is playing some role or if she needs additional therapies should she have a persisting or recurrent C. difficile infection. If she is not taking Lomotil she can begin using that, if she needs a prescription for that let us know. She should be taking her Creon 3 pills with each meal and 1 pill with each snack. Thanks. GM

## 2022-05-03 NOTE — Telephone Encounter (Signed)
The pt has been advised of the recommendations Orders have been entered for stool studies. She is taking lomotil and creon and states she has good days and bad days.  She will keep upcoming office visit.

## 2022-05-06 ENCOUNTER — Other Ambulatory Visit: Payer: Self-pay | Admitting: *Deleted

## 2022-05-06 ENCOUNTER — Other Ambulatory Visit: Payer: BC Managed Care – PPO

## 2022-05-06 ENCOUNTER — Telehealth: Payer: Self-pay | Admitting: Gastroenterology

## 2022-05-06 ENCOUNTER — Telehealth: Payer: Self-pay | Admitting: *Deleted

## 2022-05-06 MED ORDER — ONDANSETRON HCL 8 MG PO TABS: 8.0000 mg | ORAL_TABLET | Freq: Three times a day (TID) | ORAL | 3 refills | Status: DC | PRN

## 2022-05-06 MED ORDER — FLUCONAZOLE 100 MG PO TABS: 100.0000 mg | ORAL_TABLET | Freq: Every day | ORAL | 4 refills | Status: DC

## 2022-05-06 NOTE — Telephone Encounter (Signed)
Patient came into the office to pick up her stool kit, she said she had questions about that and also wanted a nurse to call her about maybe refilling her Creon and another one of her medications.

## 2022-05-06 NOTE — Telephone Encounter (Signed)
The pt wanted to make our office aware that she picked  up stool kit and will complete as able.  She also states that she does not need any refills at this time. She will make her pharmacy aware when she is due.

## 2022-05-06 NOTE — Telephone Encounter (Signed)
Patient called to let us know she has a white tongue and some patches on the sides of her mouth.  Dr Marin Olp notified.  Fluconazole Rx sent to pharmacy as well as a refill for Zofran for nausea.

## 2022-05-07 ENCOUNTER — Other Ambulatory Visit: Payer: BC Managed Care – PPO

## 2022-05-07 DIAGNOSIS — R197 Diarrhea, unspecified: Secondary | ICD-10-CM

## 2022-05-08 ENCOUNTER — Ambulatory Visit: Payer: BC Managed Care – PPO | Admitting: Dietician

## 2022-05-08 ENCOUNTER — Other Ambulatory Visit: Payer: Self-pay

## 2022-05-08 LAB — C. DIFFICILE GDH AND TOXIN A/B
GDH ANTIGEN: DETECTED
MICRO NUMBER:: 13937871
SPECIMEN QUALITY:: ADEQUATE
TOXIN A AND B: DETECTED

## 2022-05-08 LAB — CLOSTRIDIUM DIFFICILE BY PCR: Toxigenic C. Difficile by PCR: POSITIVE — AB

## 2022-05-08 MED ORDER — VANCOMYCIN HCL 125 MG PO CAPS: ORAL_CAPSULE | ORAL | 0 refills | Status: DC

## 2022-05-08 NOTE — Progress Notes (Signed)
Nutrition Follow Up:  Called patient at mobile telephone number to follow up on weight loss and diarrhea. Patient stated none of her foods are staying down.  I tried to explore further she states " no nausea but diarrhea."  I tried to ask about use of anti diarrheals and creon, she states " I take my medicine as I am supposed to."   She seemed reluctant to discuss remedies and nutritional options.  She sounded very frustrated and mentioned she was considering stopping her chemo.   She did state she has tried to use bland high protein foods (eggs and fish).  I tried to encourage soft soluble fibers and small frequent feeds.  She did state she tolerated the Ensure Clear better that the creamier supplements.  Encouraged use of 4 bottles per day and offered coupons.  She sent stool sample off to  check for C-diff/pathogens yesterday.  Awaiting results.   Labs: 05/02/22 Na 131, potassium 3.2, Glucose 219,    Anthropometrics:  weight loss continues 4 more pounds past 2 weeks after 49# (26%) past 6 months  Height: 63.5" Weight:  05/02/22  136# 04/18/22  140.4# 03/08/22  150# 11/27/21  189#  BMI: 24.64   Estimated Energy Needs  Kcals: 4742-5956 Protein: 76-128 Fluid: 2L plus loss with diarrhea   NUTRITION DIAGNOSIS: inadequate PO intake for increased needs r/t to her nausea, taste changes AEB significant weight loss and reported PO intake.   MALNUTRITION DIAGNOSIS: Suspect severe malnutrition of chronic disease with reported PO and weight loss but unable to perform NFPE.   INTERVENTION:   Tried to encouraged small frequent meals/snacks using bland foods-  Ensure Clear oral nutrition supplement  3-4 bottles per day  Will have samples of Banatrol sent  to [facility] so she can pick up when she returns next month  Mailed coupons for Ensure.    MONITORING, EVALUATION, GOAL: weight trends, nutrition impact symptoms, PO intake, labs   Next Visit: next month after MD appointment  April Manson, RDN, LDN Registered Dietitian, [facility] (Usual office hours: Tuesday-Thursday) Mobile: [phone]

## 2022-05-09 MED ORDER — VANCOMYCIN HCL 125 MG PO CAPS: ORAL_CAPSULE | ORAL | 0 refills | Status: AC

## 2022-05-09 NOTE — Telephone Encounter (Signed)
Pt called stating her food just runs through her. Explained to pt that the vancomycin should help with the diarrhea. She wants Dr. Rush Landmark to call her and discuss a procedure he mentioned to her. She states that he placed a stent that "was supposed to hold her food in but it is no working". Pt is thinking about cancelling her chemo as she has lost so much weight.

## 2022-05-09 NOTE — Telephone Encounter (Signed)
Vanc script sent to [facility], they have enough to get her through until Monday when the rest of the order will come in. Pt aware .

## 2022-05-09 NOTE — Addendum Note (Signed)
Addended by: Rosanne Sack R on: 05/09/2022 02:06 PM   Modules accepted: Orders

## 2022-05-09 NOTE — Telephone Encounter (Signed)
I called and spoke with the patient today. 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 Unfortunately the pharmacy that we sent the prescription to is not having the vancomycin this time and it may be tomorrow or this weekend. If we can try to see with the patient about what pharmacy may be closer to her I believe [facility] [address] that may be closer to her and see if it is possible whether we can get a prescription sent there if they have the medication and her getting started on it today rather than waiting tomorrow or the weekend makes most sense. I also clarified that the procedure she was alluding to previously was a repeat celiac axis block versus celiac plexus neurolysis.  There is no plan for either of those procedures currently the main issue is getting her diarrhea under control. She will continue her Lomotil. She will continue her Imodium and increase it to up to 6 tablets of Imodium daily. She knows that if things progress or worsen even if she is on treatment she needs to come into the emergency department for further evaluation in the setting of her progressive weight loss and persisting diarrhea.  Vaughan Basta, Please reach back out to the patient and find out what pharmacy is closest to her and see if we can try to get a prescription sent there if they have vancomycin so she can get started today. Thanks. GM

## 2022-05-09 NOTE — Telephone Encounter (Signed)
Patient called states she has been loosing weight since she receive her procedure. States she really need to speak to a nurse because she is concern about her weight loss at this point. Please call to advise.

## 2022-05-10 ENCOUNTER — Telehealth: Payer: Self-pay | Admitting: Gastroenterology

## 2022-05-10 LAB — GI PROFILE, STOOL, PCR

## 2022-05-10 LAB — CALPROTECTIN, FECAL: Calprotectin, Fecal: <5 ug/g (ref 0–120)

## 2022-05-10 NOTE — Telephone Encounter (Signed)
Patient called states she picked up her  vancomycin 125 mg medication yesterday from the pharmacy and the medication is not working. States she still having diarrhea last night and this morning. Wondering if she needs to continue taking the medication as for now. Or if she needs to go to the emergency room instead. Requesting a call back to know how to proceed.

## 2022-05-10 NOTE — Telephone Encounter (Signed)
Returned call to patient. I informed her that she is not going to see improvement in symptoms overnight. I told pt that it will take several days for the medication to get into her system and to start improving her symptoms. Pt is aware that she is on an extended Vancomycin taper in hopes to get diarrhea controlled. Pt will continue Vancomycin. Pt verbalized understanding and had no concerns at the end of the call.

## 2022-05-14 ENCOUNTER — Telehealth: Payer: Self-pay | Admitting: Nurse Practitioner

## 2022-05-14 ENCOUNTER — Other Ambulatory Visit: Payer: Self-pay | Admitting: Nurse Practitioner

## 2022-05-14 DIAGNOSIS — E782 Mixed hyperlipidemia: Secondary | ICD-10-CM

## 2022-05-14 NOTE — Telephone Encounter (Signed)
Pt want to know if she needs to still take the sodium chloride. Pt would like you to call her about it

## 2022-05-14 NOTE — Telephone Encounter (Signed)
Pt advised.

## 2022-05-23 ENCOUNTER — Inpatient Hospital Stay: Payer: BC Managed Care – PPO

## 2022-05-23 ENCOUNTER — Encounter: Payer: Self-pay | Admitting: Hematology & Oncology

## 2022-05-23 ENCOUNTER — Inpatient Hospital Stay: Payer: BC Managed Care – PPO | Attending: Hematology & Oncology | Admitting: Hematology & Oncology

## 2022-05-23 VITALS — BP 116/83 | HR 83

## 2022-05-23 DIAGNOSIS — I81 Portal vein thrombosis: Secondary | ICD-10-CM | POA: Diagnosis not present

## 2022-05-23 DIAGNOSIS — C251 Malignant neoplasm of body of pancreas: Secondary | ICD-10-CM

## 2022-05-23 DIAGNOSIS — Z5111 Encounter for antineoplastic chemotherapy: Secondary | ICD-10-CM | POA: Diagnosis present

## 2022-05-23 DIAGNOSIS — C259 Malignant neoplasm of pancreas, unspecified: Secondary | ICD-10-CM | POA: Diagnosis present

## 2022-05-23 DIAGNOSIS — Z7901 Long term (current) use of anticoagulants: Secondary | ICD-10-CM | POA: Insufficient documentation

## 2022-05-23 LAB — CMP (CANCER CENTER ONLY)
ALT: 15 U/L (ref 0–44)
AST: 21 U/L (ref 15–41)
Albumin: 2.9 g/dL — ABNORMAL LOW (ref 3.5–5.0)
Alkaline Phosphatase: 123 U/L (ref 38–126)
Anion gap: 7 (ref 5–15)
BUN: 6 mg/dL — ABNORMAL LOW (ref 8–23)
CO2: 22 mmol/L (ref 22–32)
Calcium: 9.1 mg/dL (ref 8.9–10.3)
Chloride: 100 mmol/L (ref 98–111)
Creatinine: 0.74 mg/dL (ref 0.44–1.00)
GFR, Estimated: >60 mL/min (ref >60)
Glucose, Bld: 151 mg/dL — ABNORMAL HIGH (ref 70–99)
Potassium: 4.2 mmol/L (ref 3.5–5.1)
Sodium: 129 mmol/L — ABNORMAL LOW (ref 135–145)
Total Bilirubin: 0.5 mg/dL (ref 0.3–1.2)
Total Protein: 7.1 g/dL (ref 6.5–8.1)

## 2022-05-23 LAB — CBC WITH DIFFERENTIAL (CANCER CENTER ONLY)
Abs Immature Granulocytes: 0.04 10*3/uL (ref 0.00–0.07)
Basophils Absolute: 0.2 10*3/uL — ABNORMAL HIGH (ref 0.0–0.1)
Basophils Relative: 2 %
Eosinophils Absolute: 0.1 10*3/uL (ref 0.0–0.5)
Eosinophils Relative: 1 %
HCT: 32.5 % — ABNORMAL LOW (ref 36.0–46.0)
Hemoglobin: 10.7 g/dL — ABNORMAL LOW (ref 12.0–15.0)
Immature Granulocytes: 0 %
Lymphocytes Relative: 16 %
Lymphs Abs: 1.5 10*3/uL (ref 0.7–4.0)
MCH: 28.5 pg (ref 26.0–34.0)
MCHC: 32.9 g/dL (ref 30.0–36.0)
MCV: 86.4 fL (ref 80.0–100.0)
Monocytes Absolute: 0.6 10*3/uL (ref 0.1–1.0)
Monocytes Relative: 6 %
Neutro Abs: 6.9 10*3/uL (ref 1.7–7.7)
Neutrophils Relative %: 75 %
Platelet Count: 617 10*3/uL — ABNORMAL HIGH (ref 150–400)
RBC: 3.76 MIL/uL — ABNORMAL LOW (ref 3.87–5.11)
RDW: 15 % (ref 11.5–15.5)
WBC Count: 9.3 10*3/uL (ref 4.0–10.5)
nRBC: 0 % (ref 0.0–0.2)

## 2022-05-23 LAB — SAMPLE TO BLOOD BANK

## 2022-05-23 LAB — PREALBUMIN: Prealbumin: 8 mg/dL — ABNORMAL LOW (ref 18–38)

## 2022-05-23 MED ORDER — POTASSIUM CHLORIDE CRYS ER 20 MEQ PO TBCR: 20.0000 meq | EXTENDED_RELEASE_TABLET | Freq: Two times a day (BID) | ORAL | 3 refills | Status: AC

## 2022-05-23 MED ORDER — SODIUM CHLORIDE 0.9 % IV SOLN
800.0000 mg/m2 | Freq: Once | INTRAVENOUS | Status: AC
  Administered 2022-05-23: 1368 mg via INTRAVENOUS
  Filled 2022-05-23: qty 26.3

## 2022-05-23 MED ORDER — SODIUM CHLORIDE 0.9 % IV SOLN: Freq: Once | INTRAVENOUS | Status: DC

## 2022-05-23 MED ORDER — HEPARIN SOD (PORK) LOCK FLUSH 100 UNIT/ML IV SOLN
500.0000 [IU] | Freq: Once | INTRAVENOUS | Status: AC | PRN
  Administered 2022-05-23: 500 [IU]

## 2022-05-23 MED ORDER — SODIUM CHLORIDE 0.9% FLUSH
10.0000 mL | INTRAVENOUS | Status: DC | PRN
  Administered 2022-05-23: 10 mL

## 2022-05-23 MED ORDER — COLD PACK MISC ONCOLOGY: 1.0000 | Freq: Once | Status: DC | PRN

## 2022-05-23 MED ORDER — SODIUM CHLORIDE 0.9 % IV SOLN: Freq: Once | INTRAVENOUS | Status: AC

## 2022-05-23 MED ORDER — PROCHLORPERAZINE MALEATE 10 MG PO TABS
10.0000 mg | ORAL_TABLET | Freq: Once | ORAL | Status: AC
  Administered 2022-05-23: 10 mg via ORAL
  Filled 2022-05-23: qty 1

## 2022-05-23 NOTE — Patient Instructions (Signed)
[facility]  Discharge Instructions: Thank you for choosing [facility] to provide your oncology and hematology care.   If you have a lab appointment with the Cancer Center, please go directly to the Cancer Center and check in at the registration area.  Wear comfortable clothing and clothing appropriate for easy access to any Portacath or PICC line.   We strive to give you quality time with your provider. You may need to reschedule your appointment if you arrive late (15 or more minutes).  Arriving late affects you and other patients whose appointments are after yours.  Also, if you miss three or more appointments without notifying the office, you may be dismissed from the clinic at the provider's discretion.      For prescription refill requests, have your pharmacy contact our office and allow 72 hours for refills to be completed.    Today you received the following chemotherapy and/or immunotherapy agents Gemzar      To help prevent nausea and vomiting after your treatment, we encourage you to take your nausea medication as directed.  BELOW ARE SYMPTOMS THAT SHOULD BE REPORTED IMMEDIATELY: *FEVER GREATER THAN 100.4 F (38 C) OR HIGHER *CHILLS OR SWEATING *NAUSEA AND VOMITING THAT IS NOT CONTROLLED WITH YOUR NAUSEA MEDICATION *UNUSUAL SHORTNESS OF BREATH *UNUSUAL BRUISING OR BLEEDING *URINARY PROBLEMS (pain or burning when urinating, or frequent urination) *BOWEL PROBLEMS (unusual diarrhea, constipation, pain near the anus) TENDERNESS IN MOUTH AND THROAT WITH OR WITHOUT PRESENCE OF ULCERS (sore throat, sores in mouth, or a toothache) UNUSUAL RASH, SWELLING OR PAIN  UNUSUAL VAGINAL DISCHARGE OR ITCHING   Items with * indicate a potential emergency and should be followed up as soon as possible or go to the Emergency Department if any problems should occur.  Please show the CHEMOTHERAPY ALERT CARD or IMMUNOTHERAPY ALERT CARD at check-in to the  Emergency Department and triage nurse. Should you have questions after your visit or need to cancel or reschedule your appointment, please contact [facility]  [phone] and follow the prompts.  Office hours are 8:00 a.m. to 4:30 p.m. Monday - Friday. Please note that voicemails left after 4:00 p.m. may not be returned until the following business day.  We are closed weekends and major holidays. You have access to a nurse at all times for urgent questions. Please call the main number to the clinic [phone] and follow the prompts.  For any non-urgent questions, you may also contact your provider using MyChart. We now offer e-Visits for anyone 18 and older to request care online for non-urgent symptoms. For details visit [email or web address].   Also download the MyChart app! Go to the app store, search "MyChart", open the app, select [facility], and log in with your MyChart username and password.  Masks are optional in the cancer centers. If you would like for your care team to wear a mask while they are taking care of you, please let them know. You may have one support person who is at least 65 years old accompany you for your appointments. 

## 2022-05-23 NOTE — Progress Notes (Signed)
Hematology and Oncology Follow Up Visit  Melanie Alvarez [id number] 1956-03-21 66 y.o. 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 twice daily-started on 12/24/2021 Abraxane/Gemzar --start cycle #2 on 04/24/2022 -Abraxane dropped on 05/23/2022   Interim History:  Melanie Alvarez is here today for follow-up.  She does not want any further chemotherapy.  Apparently, she has had problems with the Abraxane/Gemzar combination.  I told her that I probably would try to just drop the Abraxane is since I think that is probably causing her issues.  I think a single agent Gemzar would probably be reasonable way to try to help her.  She is in agreement.  She is still eating.  She is not having any problems with diarrhea.  She is having bowel movements.  She has occasional nausea and vomiting.  There is been some abdominal discomfort.  She had a reaction to the Duragesic patches.  As such, we have her off these.  She has had no fever.  There has been no bleeding.  She has had no cough or shortness of breath.  There is been no leg swelling.  She does have Eliquis on board because of the portal vein thrombus.  Unfortunately, her tumor markers have always been normal so we really cannot use these as to monitor for response.  Currently, I would say performance status is probably ECOG 2.    Medications:  Allergies as of 05/23/2022       Reactions   Lisinopril Swelling, Other (See Comments)   Facial and upper lip        Medication List        Accurate as of May 23, 2022  8:35 AM. If you have any questions, ask your nurse or doctor.          ALPRAZolam 1 MG tablet Commonly known as: XANAX Take 0.5 tablets (0.5 mg total) by mouth  every 8 (eight) hours as needed for anxiety.   amLODipine 10 MG tablet Commonly known as: NORVASC Take 1 tablet (10 mg total) by mouth at bedtime.   apixaban 5 MG Tabs tablet Commonly known as: Eliquis Take 1 tablet (5 mg total) by mouth 2 (two) times daily.   diphenoxylate-atropine 2.5-0.025 MG tablet Commonly known as: LOMOTIL Take 2 tablets by mouth 4 (four) times daily as needed for diarrhea or loose stools.   fluconazole 100 MG tablet Commonly known as: DIFLUCAN Take 1 tablet (100 mg total) by mouth daily.   gabapentin 400 MG capsule Commonly known as: NEURONTIN Take 1 capsule (400 mg total) by mouth 2 (two) times daily.   glipiZIDE 5 MG 24 hr tablet Commonly known as: GLUCOTROL XL Take 1 tablet (5 mg total) by mouth daily with breakfast.   HYDROmorphone 4 MG tablet Commonly known as: Dilaudid Take 1 tablet (4 mg total) by mouth every 4 (four) hours as needed for severe pain.   lipase/protease/amylase 36000 UNITS Cpep capsule Commonly known as: Creon Take 2 capsules (72,000 Units total) by mouth in the morning, at noon, in the evening, and at bedtime.   loperamide 2 MG capsule Commonly known as: IMODIUM Take 2 capsules (4 mg total) by mouth 3 (three) times daily as needed for diarrhea or loose stools.   meclizine 25  MG tablet Commonly known as: ANTIVERT Take 1 tablet (25 mg total) by mouth 3 (three) times daily as needed for dizziness.   ondansetron 8 MG tablet Commonly known as: ZOFRAN Take 1 tablet (8 mg total) by mouth every 8 (eight) hours as needed for nausea or vomiting.   pantoprazole 40 MG tablet Commonly known as: Protonix Take 1 tablet (40 mg total) by mouth daily.   potassium chloride SA 20 MEQ tablet Commonly known as: KLOR-CON M Take 2 tablets (40 mEq total) by mouth 2 (two) times daily.   sucralfate 1 g tablet Commonly known as: CARAFATE Take 1 tablet (1 g total) by mouth 2 (two) times daily.   vancomycin 125 MG capsule Commonly known as:  VANCOCIN Take 1 capsule (125 mg total) by mouth 4 (four) times daily for 14 days, THEN 1 capsule (125 mg total) 2 (two) times daily for 14 days, THEN 1 capsule (125 mg total) daily for 14 days. Start taking on: May 09, 2022        Allergies:  Allergies  Allergen Reactions   Lisinopril Swelling and Other (See Comments)    Facial and upper lip    Past Medical History, Surgical history, Social history, and Family History were reviewed and updated.  Review of Systems: Review of Systems  Constitutional: Negative.   HENT: Negative.    Eyes: Negative.   Respiratory: Negative.    Cardiovascular: Negative.   Gastrointestinal: Negative.   Genitourinary: Negative.   Musculoskeletal: Negative.   Skin: Negative.   Neurological: Negative.   Endo/Heme/Allergies: Negative.   Psychiatric/Behavioral: Negative.        Physical Exam:  height is '5\' 3"'$  (1.6 m) and weight is 131 lb 1.9 oz (59.5 kg). Her temperature is 97.7 F (36.5 C). Her pulse is 85. Her respiration is 20 and oxygen saturation is 99%.   Wt Readings from Last 3 Encounters:  05/23/22 131 lb 1.9 oz (59.5 kg)  05/02/22 136 lb (61.7 kg)  04/18/22 140 lb 6.4 oz (63.7 kg)    Physical Exam Vitals reviewed.  HENT:     Head: Normocephalic and atraumatic.  Eyes:     Pupils: Pupils are equal, round, and reactive to light.  Cardiovascular:     Rate and Rhythm: Normal rate and regular rhythm.     Heart sounds: Normal heart sounds.  Pulmonary:     Effort: Pulmonary effort is normal.     Breath sounds: Normal breath sounds.  Abdominal:     General: Bowel sounds are normal.     Palpations: Abdomen is soft.     Comments: Abdominal exam is slightly distended.  There is some tenderness to palpation.  Bowel sounds are decreased.  She has no obvious fluid wave.  There is no obvious abdominal mass.  I cannot palpate her liver or spleen.  Musculoskeletal:        General: No tenderness or deformity. Normal range of motion.      Cervical back: Normal range of motion.     Comments: Extremities shows decreased range of motion of the left shoulder.  There is decreased abduction.  She has very little rotation of the left shoulder.  Lymphadenopathy:     Cervical: No cervical adenopathy.  Skin:    General: Skin is warm and dry.     Findings: No erythema or rash.  Neurological:     Mental Status: She is alert and oriented to person, place, and time.  Psychiatric:  Behavior: Behavior normal.        Thought Content: Thought content normal.        Judgment: Judgment normal.     Lab Results  Component Value Date   WBC 4.3 05/02/2022   HGB 9.9 (L) 05/02/2022   HCT 29.5 (L) 05/02/2022   MCV 86.0 05/02/2022   PLT 167 05/02/2022   Lab Results  Component Value Date   FERRITIN 38 03/08/2022   IRON 38 03/08/2022   TIBC 247 (L) 03/08/2022   UIBC NOT CALCULATED 03/08/2022   IRONPCTSAT NOT CALCULATED 03/08/2022   Lab Results  Component Value Date   RETICCTPCT 1.9 03/08/2022   RBC 3.43 (L) 05/02/2022   No results found for: "KPAFRELGTCHN", "LAMBDASER", "KAPLAMBRATIO" No results found for: "IGGSERUM", "IGA", "IGMSERUM" No results found for: "TOTALPROTELP", "ALBUMINELP", "A1GS", "A2GS", "BETS", "BETA2SER", "GAMS", "MSPIKE", "SPEI"   Chemistry      Component Value Date/Time   NA 131 (L) 05/02/2022 1035   K 3.2 (L) 05/02/2022 1035   CL 98 05/02/2022 1035   CO2 26 05/02/2022 1035   BUN <5 (L) 05/02/2022 1035   CREATININE 0.59 05/02/2022 1035   CREATININE 0.78 03/23/2018 1018      Component Value Date/Time   CALCIUM 9.1 05/02/2022 1035   ALKPHOS 211 (H) 05/02/2022 1035   AST 23 05/02/2022 1035   ALT 15 05/02/2022 1035   ALT 40 (H) 11/25/2017 0947   BILITOT 0.5 05/02/2022 1035       Impression and Plan: Ms. Sherpa is a very pleasant 66 yo African American female with adenocarcinoma of the pancreas, ampullary carcinoma.  She received upfront chemotherapy and radiation therapy.  She did not want any  surgery.  She now has recurrent disease.  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  I think she is on Eliquis for this.  I will go ahead with single agent gemcitabine.  Hopefully, this will be tolerated and hopefully it will be effective.  I does want to try to give her a chance at a longer life.  I know her quality of life has been a little bit compromised.  Again we are focusing on her quality of life as a priority.  Maybe, single agent Gemzar will be able to do this for [place].  We will go ahead with Gemzar this week and next week.  I will then plan to see her back myself in November.  Probably after that cycle, we will then plan for scans.  I think she sees Gastroenterology in a week or so.   Volanda Napoleon, MD 10/5/20238:35 AM

## 2022-05-23 NOTE — Addendum Note (Signed)
Addended by: Burney Gauze R on: 05/23/2022 11:55 AM   Modules accepted: Orders

## 2022-05-23 NOTE — Patient Instructions (Signed)

## 2022-05-24 ENCOUNTER — Other Ambulatory Visit: Payer: Self-pay

## 2022-05-26 ENCOUNTER — Other Ambulatory Visit: Payer: Self-pay

## 2022-05-29 NOTE — Progress Notes (Signed)
Patient canceled, has OV 10/17 with Dr. Rush Landmark

## 2022-05-30 ENCOUNTER — Inpatient Hospital Stay: Payer: BC Managed Care – PPO

## 2022-05-30 VITALS — BP 122/82 | HR 85 | Resp 18

## 2022-05-30 DIAGNOSIS — C251 Malignant neoplasm of body of pancreas: Secondary | ICD-10-CM

## 2022-05-30 DIAGNOSIS — Z5111 Encounter for antineoplastic chemotherapy: Secondary | ICD-10-CM | POA: Diagnosis not present

## 2022-05-30 LAB — CMP (CANCER CENTER ONLY)
ALT: 17 U/L (ref 0–44)
AST: 24 U/L (ref 15–41)
Albumin: 2.7 g/dL — ABNORMAL LOW (ref 3.5–5.0)
Alkaline Phosphatase: 120 U/L (ref 38–126)
Anion gap: 6 (ref 5–15)
BUN: 5 mg/dL — ABNORMAL LOW (ref 8–23)
CO2: 23 mmol/L (ref 22–32)
Calcium: 8.7 mg/dL — ABNORMAL LOW (ref 8.9–10.3)
Chloride: 104 mmol/L (ref 98–111)
Creatinine: 0.83 mg/dL (ref 0.44–1.00)
GFR, Estimated: >60 mL/min (ref >60)
Glucose, Bld: 168 mg/dL — ABNORMAL HIGH (ref 70–99)
Potassium: 3.6 mmol/L (ref 3.5–5.1)
Sodium: 133 mmol/L — ABNORMAL LOW (ref 135–145)
Total Bilirubin: 0.4 mg/dL (ref 0.3–1.2)
Total Protein: 6.6 g/dL (ref 6.5–8.1)

## 2022-05-30 LAB — CBC WITH DIFFERENTIAL (CANCER CENTER ONLY)
Abs Immature Granulocytes: 0.01 10*3/uL (ref 0.00–0.07)
Basophils Absolute: 0.1 10*3/uL (ref 0.0–0.1)
Basophils Relative: 3 %
Eosinophils Absolute: 0.1 10*3/uL (ref 0.0–0.5)
Eosinophils Relative: 1 %
HCT: 29.6 % — ABNORMAL LOW (ref 36.0–46.0)
Hemoglobin: 9.9 g/dL — ABNORMAL LOW (ref 12.0–15.0)
Immature Granulocytes: 0 %
Lymphocytes Relative: 38 %
Lymphs Abs: 1.6 10*3/uL (ref 0.7–4.0)
MCH: 28.9 pg (ref 26.0–34.0)
MCHC: 33.4 g/dL (ref 30.0–36.0)
MCV: 86.3 fL (ref 80.0–100.0)
Monocytes Absolute: 0.3 10*3/uL (ref 0.1–1.0)
Monocytes Relative: 6 %
Neutro Abs: 2.1 10*3/uL (ref 1.7–7.7)
Neutrophils Relative %: 52 %
Platelet Count: 275 10*3/uL (ref 150–400)
RBC: 3.43 MIL/uL — ABNORMAL LOW (ref 3.87–5.11)
RDW: 14.7 % (ref 11.5–15.5)
WBC Count: 4.1 10*3/uL (ref 4.0–10.5)
nRBC: 0 % (ref 0.0–0.2)

## 2022-05-30 MED ORDER — PROCHLORPERAZINE MALEATE 10 MG PO TABS
10.0000 mg | ORAL_TABLET | Freq: Once | ORAL | Status: AC
  Administered 2022-05-30: 10 mg via ORAL
  Filled 2022-05-30: qty 1

## 2022-05-30 MED ORDER — SODIUM CHLORIDE 0.9 % IV SOLN
800.0000 mg/m2 | Freq: Once | INTRAVENOUS | Status: AC
  Administered 2022-05-30: 1368 mg via INTRAVENOUS
  Filled 2022-05-30: qty 26.3

## 2022-05-30 MED ORDER — SODIUM CHLORIDE 0.9% FLUSH
10.0000 mL | INTRAVENOUS | Status: DC | PRN
  Administered 2022-05-30: 10 mL

## 2022-05-30 MED ORDER — SODIUM CHLORIDE 0.9 % IV SOLN: Freq: Once | INTRAVENOUS | Status: DC

## 2022-05-30 MED ORDER — HEPARIN SOD (PORK) LOCK FLUSH 100 UNIT/ML IV SOLN
500.0000 [IU] | Freq: Once | INTRAVENOUS | Status: AC | PRN
  Administered 2022-05-30: 500 [IU]

## 2022-05-30 MED ORDER — SODIUM CHLORIDE 0.9 % IV SOLN: Freq: Once | INTRAVENOUS | Status: AC

## 2022-05-30 NOTE — Patient Instructions (Signed)

## 2022-05-31 ENCOUNTER — Ambulatory Visit (INDEPENDENT_AMBULATORY_CARE_PROVIDER_SITE_OTHER): Payer: BC Managed Care – PPO | Admitting: Physician Assistant

## 2022-05-31 ENCOUNTER — Other Ambulatory Visit: Payer: Self-pay

## 2022-05-31 DIAGNOSIS — Z8507 Personal history of malignant neoplasm of pancreas: Secondary | ICD-10-CM

## 2022-05-31 DIAGNOSIS — K315 Obstruction of duodenum: Secondary | ICD-10-CM

## 2022-06-04 ENCOUNTER — Ambulatory Visit: Payer: BC Managed Care – PPO | Admitting: Gastroenterology

## 2022-06-04 ENCOUNTER — Telehealth: Payer: Self-pay | Admitting: Gastroenterology

## 2022-06-04 NOTE — Telephone Encounter (Signed)
Good Afternoon Dr. Rush Landmark,   Patient called stating that she would not be able to make it to her appointment with you today due to not feeling well.   Patient was rescheduled for 12/20 at 11:30

## 2022-06-04 NOTE — Telephone Encounter (Signed)
Sorry to hear this. I will see her in December.  Thanks. GM

## 2022-06-05 ENCOUNTER — Other Ambulatory Visit: Payer: Self-pay

## 2022-06-06 ENCOUNTER — Telehealth: Payer: Self-pay | Admitting: Gastroenterology

## 2022-06-06 NOTE — Telephone Encounter (Signed)
Inbound call from patient stating she is unable to keep food down, it goes right through her. She is suffering with diarrhea and would like to speak with someone. Please advise

## 2022-06-06 NOTE — Telephone Encounter (Signed)
Error pt cancelled appt with [facility] Bamberg will route to [facility]

## 2022-06-06 NOTE — Telephone Encounter (Signed)
The pt wanted to confirm the medications that she is suppose to be taking.  We discussed lomotil, imodium, and vancomycin.  She has vanc and lomotil but says she does not have any imodium.  She will pick that up today.  No further questions or concerns.

## 2022-06-06 NOTE — Telephone Encounter (Signed)
Inbound call from patient returning call please advise.

## 2022-06-06 NOTE — Telephone Encounter (Signed)
Patient called requested to speak with you again regarding conversation below and certain medications.

## 2022-06-06 NOTE — Telephone Encounter (Signed)
This pt has panc cancer and chronic diarrhea/C diff.  She has tried lomotil 2 tabs 4 times daily, imodium 6 tabs daily, vancomycin and creon. She has 1 week left of vanc taper. She continues to have diarrhea and weight loss.  She had an appt with Vicie Mutters PA on 10/13 and cancelled then an appt with Dr Rush Landmark on 10/17 and cancelled same day.   Per the phone note dated 05/06/22 from Dr Rush Landmark "She knows that if things progress or worsen even if she is on treatment she needs to come into the emergency department for further evaluation in the setting of her progressive weight loss and persisting diarrhea."     I have made the pt aware of Dr Donneta Romberg recommendations and the pt states she will see how she does and if she does not improve she will go to the ED for eval.  She states she will keep the appt with Dr Rush Landmark in Dec

## 2022-06-06 NOTE — Telephone Encounter (Signed)
Pt last saw Amanda Collier will send to her nurse  

## 2022-06-07 ENCOUNTER — Inpatient Hospital Stay: Payer: BC Managed Care – PPO

## 2022-06-07 NOTE — Progress Notes (Signed)
[facility] CSW Progress Note  Clinical Education officer, museum contacted patient by phone to assess psychosocial needs per the request of Olena Mater, [place].  Informed patient for reason of call.  She stated she was not feeling well today and was resting.  She agreed to allow CSW to contact her at another time.  Patient expressed no needs at this time.    Rodman Pickle Aadith Raudenbush, LCSW

## 2022-06-10 ENCOUNTER — Telehealth: Payer: Self-pay | Admitting: *Deleted

## 2022-06-10 NOTE — Telephone Encounter (Signed)
Received a call from niece Varney Biles inquiring about [facility] referral.  Referral sent back in September.  Refaxed all materials to [employer].  Told family member we should hear back hopefully this week.

## 2022-06-13 ENCOUNTER — Other Ambulatory Visit: Payer: Self-pay | Admitting: *Deleted

## 2022-06-13 ENCOUNTER — Ambulatory Visit: Payer: BC Managed Care – PPO | Admitting: Dietician

## 2022-06-13 DIAGNOSIS — C251 Malignant neoplasm of body of pancreas: Secondary | ICD-10-CM

## 2022-06-13 DIAGNOSIS — C250 Malignant neoplasm of head of pancreas: Secondary | ICD-10-CM

## 2022-06-13 DIAGNOSIS — R1013 Epigastric pain: Secondary | ICD-10-CM

## 2022-06-13 DIAGNOSIS — D649 Anemia, unspecified: Secondary | ICD-10-CM

## 2022-06-13 NOTE — Progress Notes (Signed)
Nutrition Follow Up:  Called patient at mobile telephone number to follow up on weight loss and diarrhea. Patient stated the Casandra Doffing was working for her then stopped and she continues with her diarrhea.  She is now taking OTC Imodium and getting some relief.  She reports she has been advised not to eat egg yolks or cheese by MD, and Goggled that peanut butter wouldn't be good for her type of cancer.  I relayed that [facility]HA has changed recommendations on whole eggs and she could liberalize her diet with higher sat fat if need to help her increase protein intake and allow her to maintain weight and LBM.    She is no longer using any ONS. She is willing to try Unjury broth samples if I send her some.   She has [place] and tomato sandwiches for breakfast, chips for lunch, and she says she eats chicken or fish for dinner.  She reports fluids are mostly water or apple juice.  She is not taking any electrolyte replacement drinks at this time.    Labs: 05/30/22 Na 133, Glucose 168,  Ca 8.7, BUN 5, Albumin 2.7, Hgb 9.9   Anthropometrics:  weight loss continues 4 more pounds past 2 weeks after 49# (26%) past 6 months  Height: 63.5" Weight:  05/02/22  136# 04/18/22  140.4# 03/08/22  150# 11/27/21  189#  BMI: 24.64   Estimated Energy Needs  Kcals: 6503-5465 Protein: 76-128 Fluid: 2L plus loss with diarrhea   NUTRITION DIAGNOSIS: inadequate PO intake for increased needs r/t to her nausea, taste changes AEB significant weight loss and reported PO intake. Continues   MALNUTRITION DIAGNOSIS: Suspect severe malnutrition of chronic disease with reported PO and weight loss but unable to perform NFPE.   INTERVENTION:  Encouraged increase of high protein foods to help her maintain her weight.  Encouraged use of vegetable juices to boost her sodium intake with nutrient dense fluids.  Trial OTC iron may slow bowels and help with Hgb.  Will mail Unjury samples.   MONITORING, EVALUATION, GOAL: weight  trends, nutrition impact symptoms, PO intake, labs   Next Visit: next week remote  April Manson, RDN, LDN Registered Dietitian, [place] (Usual office hours: Tuesday-Thursday) Mobile: [phone]

## 2022-06-20 ENCOUNTER — Encounter: Payer: BC Managed Care – PPO | Admitting: Dietician

## 2022-06-27 ENCOUNTER — Inpatient Hospital Stay: Payer: BC Managed Care – PPO

## 2022-06-27 ENCOUNTER — Encounter: Payer: Self-pay | Admitting: *Deleted

## 2022-06-27 ENCOUNTER — Inpatient Hospital Stay: Payer: BC Managed Care – PPO | Attending: Hematology & Oncology

## 2022-06-27 ENCOUNTER — Inpatient Hospital Stay (HOSPITAL_BASED_OUTPATIENT_CLINIC_OR_DEPARTMENT_OTHER): Payer: BC Managed Care – PPO | Admitting: Hematology & Oncology

## 2022-06-27 ENCOUNTER — Other Ambulatory Visit: Payer: Self-pay | Admitting: Oncology

## 2022-06-27 ENCOUNTER — Encounter: Payer: Self-pay | Admitting: Hematology & Oncology

## 2022-06-27 VITALS — BP 132/76 | HR 94 | Temp 97.7°F | Resp 20 | Ht 63.0 in | Wt 138.1 lb

## 2022-06-27 VITALS — BP 118/82 | HR 96 | Temp 98.0°F | Resp 18

## 2022-06-27 DIAGNOSIS — I81 Portal vein thrombosis: Secondary | ICD-10-CM | POA: Insufficient documentation

## 2022-06-27 DIAGNOSIS — R0602 Shortness of breath: Secondary | ICD-10-CM | POA: Diagnosis not present

## 2022-06-27 DIAGNOSIS — R1110 Vomiting, unspecified: Secondary | ICD-10-CM | POA: Insufficient documentation

## 2022-06-27 DIAGNOSIS — Z7901 Long term (current) use of anticoagulants: Secondary | ICD-10-CM | POA: Diagnosis not present

## 2022-06-27 DIAGNOSIS — C251 Malignant neoplasm of body of pancreas: Secondary | ICD-10-CM

## 2022-06-27 DIAGNOSIS — E1169 Type 2 diabetes mellitus with other specified complication: Secondary | ICD-10-CM

## 2022-06-27 DIAGNOSIS — C259 Malignant neoplasm of pancreas, unspecified: Secondary | ICD-10-CM | POA: Diagnosis present

## 2022-06-27 DIAGNOSIS — Z5111 Encounter for antineoplastic chemotherapy: Secondary | ICD-10-CM | POA: Diagnosis not present

## 2022-06-27 LAB — CBC WITH DIFFERENTIAL (CANCER CENTER ONLY)
Abs Immature Granulocytes: 0.08 10*3/uL — ABNORMAL HIGH (ref 0.00–0.07)
Basophils Absolute: 0.1 10*3/uL (ref 0.0–0.1)
Basophils Relative: 1 %
Eosinophils Absolute: 0 10*3/uL (ref 0.0–0.5)
Eosinophils Relative: 0 %
HCT: 27.1 % — ABNORMAL LOW (ref 36.0–46.0)
Hemoglobin: 9.3 g/dL — ABNORMAL LOW (ref 12.0–15.0)
Immature Granulocytes: 1 %
Lymphocytes Relative: 9 %
Lymphs Abs: 1.2 10*3/uL (ref 0.7–4.0)
MCH: 30 pg (ref 26.0–34.0)
MCHC: 34.3 g/dL (ref 30.0–36.0)
MCV: 87.4 fL (ref 80.0–100.0)
Monocytes Absolute: 0.7 10*3/uL (ref 0.1–1.0)
Monocytes Relative: 5 %
Neutro Abs: 11.7 10*3/uL — ABNORMAL HIGH (ref 1.7–7.7)
Neutrophils Relative %: 84 %
Platelet Count: 597 10*3/uL — ABNORMAL HIGH (ref 150–400)
RBC: 3.1 MIL/uL — ABNORMAL LOW (ref 3.87–5.11)
RDW: 18.5 % — ABNORMAL HIGH (ref 11.5–15.5)
WBC Count: 13.8 10*3/uL — ABNORMAL HIGH (ref 4.0–10.5)
nRBC: 0 % (ref 0.0–0.2)

## 2022-06-27 LAB — CMP (CANCER CENTER ONLY)
ALT: 25 U/L (ref 0–44)
AST: 31 U/L (ref 15–41)
Albumin: 2.3 g/dL — ABNORMAL LOW (ref 3.5–5.0)
Alkaline Phosphatase: 163 U/L — ABNORMAL HIGH (ref 38–126)
Anion gap: 6 (ref 5–15)
BUN: 12 mg/dL (ref 8–23)
CO2: 23 mmol/L (ref 22–32)
Calcium: 8.4 mg/dL — ABNORMAL LOW (ref 8.9–10.3)
Chloride: 99 mmol/L (ref 98–111)
Creatinine: 0.74 mg/dL (ref 0.44–1.00)
GFR, Estimated: >60 mL/min
Glucose, Bld: 193 mg/dL — ABNORMAL HIGH (ref 70–99)
Potassium: 4 mmol/L (ref 3.5–5.1)
Sodium: 128 mmol/L — ABNORMAL LOW (ref 135–145)
Total Bilirubin: 0.8 mg/dL (ref 0.3–1.2)
Total Protein: 6.1 g/dL — ABNORMAL LOW (ref 6.5–8.1)

## 2022-06-27 LAB — PREALBUMIN: Prealbumin: 5 mg/dL — ABNORMAL LOW (ref 18–38)

## 2022-06-27 LAB — LACTATE DEHYDROGENASE: LDH: 235 U/L — ABNORMAL HIGH (ref 98–192)

## 2022-06-27 LAB — PREPARE RBC (CROSSMATCH)

## 2022-06-27 MED ORDER — SODIUM CHLORIDE 0.9% FLUSH
10.0000 mL | INTRAVENOUS | Status: DC | PRN
  Administered 2022-06-27: 10 mL

## 2022-06-27 MED ORDER — SODIUM CHLORIDE 0.9 % IV SOLN
800.0000 mg/m2 | Freq: Once | INTRAVENOUS | Status: AC
  Administered 2022-06-27: 1368 mg via INTRAVENOUS
  Filled 2022-06-27: qty 10.52

## 2022-06-27 MED ORDER — ONDANSETRON HCL 8 MG PO TABS: 8.0000 mg | ORAL_TABLET | Freq: Three times a day (TID) | ORAL | 3 refills | Status: AC | PRN

## 2022-06-27 MED ORDER — MECLIZINE HCL 25 MG PO TABS: 25.0000 mg | ORAL_TABLET | Freq: Three times a day (TID) | ORAL | 1 refills | Status: AC | PRN

## 2022-06-27 MED ORDER — FUROSEMIDE 10 MG/ML IJ SOLN
20.0000 mg | Freq: Once | INTRAMUSCULAR | Status: AC
  Administered 2022-06-27: 20 mg via INTRAVENOUS
  Filled 2022-06-27: qty 4

## 2022-06-27 MED ORDER — GLIPIZIDE ER 5 MG PO TB24: 5.0000 mg | ORAL_TABLET | Freq: Every day | ORAL | 1 refills | Status: DC

## 2022-06-27 MED ORDER — SODIUM CHLORIDE 0.9 % IV SOLN: Freq: Once | INTRAVENOUS | Status: AC

## 2022-06-27 MED ORDER — HYDROMORPHONE HCL 4 MG PO TABS: 4.0000 mg | ORAL_TABLET | ORAL | 0 refills | Status: AC | PRN

## 2022-06-27 MED ORDER — PROCHLORPERAZINE MALEATE 10 MG PO TABS
10.0000 mg | ORAL_TABLET | Freq: Once | ORAL | Status: AC
  Administered 2022-06-27: 10 mg via ORAL
  Filled 2022-06-27: qty 1

## 2022-06-27 MED ORDER — LOPERAMIDE HCL 2 MG PO CAPS: 4.0000 mg | ORAL_CAPSULE | Freq: Three times a day (TID) | ORAL | 0 refills | Status: AC | PRN

## 2022-06-27 MED ORDER — HEPARIN SOD (PORK) LOCK FLUSH 100 UNIT/ML IV SOLN
500.0000 [IU] | Freq: Once | INTRAVENOUS | Status: AC | PRN
  Administered 2022-06-27: 500 [IU]

## 2022-06-27 NOTE — Patient Instructions (Signed)

## 2022-06-27 NOTE — Progress Notes (Unsigned)
Referral for [facility] re-faxed to [phone].  [facility] states that they did not receive first referral sent on 06/10/22.

## 2022-06-27 NOTE — Progress Notes (Signed)
Hematology and Oncology Follow Up Visit  Melanie Alvarez [id number] October 12, 1955 66 y.o. 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 twice daily-started on 12/24/2021 Abraxane/Gemzar --start cycle #2 on 04/24/2022 -Abraxane dropped on 05/23/2022   Interim History:  Ms. Glassberg is here today for follow-up.  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  Hopefully, this will be effective for the cancer..  She had an episode of vomiting yesterday.  She says she threw up green material.  She said it may have been from some bad meat that she may have eaten a couple days ago.  This morning, she is feeling okay.  She does not complain of any pain.  She has had no cough or shortness of breath.  She has had no bleeding.  She is on Eliquis because of the portal vein/mesenteric vein thrombus.  She has had no diarrhea.  There is been no leg swelling.  She has had no rashes.  Her last CA 19-9 was less than 2.  This really has not been a marker for [place].  She does have a little bit of shortness of breath.  She is little bit more anemic.  She is not iron deficient.  She has a low erythropoietin level.  We may have to consider this.  I do think 1 unit of blood would not be a bad idea for her.  I talked her about this.  I think this may help make her feel a little bit better.  This will certainly help with the shortness of breath.  Overall, I would have to say that her performance status is probably ECOG 2.     Medications:  Allergies as of 06/27/2022       Reactions   Lisinopril Swelling, Other (See Comments)   Facial and upper lip        Medication List        Accurate as of June 27, 2022  8:50 AM. If you have any questions, ask your  nurse or doctor.          STOP taking these medications    fluconazole 100 MG tablet Commonly known as: DIFLUCAN Stopped by: Volanda Napoleon, MD       TAKE these medications    ALPRAZolam 1 MG tablet Commonly known as: XANAX Take 0.5 tablets (0.5 mg total) by mouth every 8 (eight) hours as needed for anxiety.   amLODipine 10 MG tablet Commonly known as: NORVASC Take 1 tablet (10 mg total) by mouth at bedtime.   apixaban 5 MG Tabs tablet Commonly known as: Eliquis Take 1 tablet (5 mg total) by mouth 2 (two) times daily.   diphenoxylate-atropine 2.5-0.025 MG tablet Commonly known as: LOMOTIL Take 2 tablets by mouth 4 (four) times daily as needed for diarrhea or loose stools.   gabapentin 400 MG capsule Commonly known as: NEURONTIN Take 1 capsule (400 mg total) by mouth 2 (two) times daily.   glipiZIDE 5 MG 24 hr tablet Commonly known as: GLUCOTROL XL Take 1 tablet (5 mg total) by mouth daily with breakfast.   HYDROmorphone 4 MG tablet Commonly known as: Dilaudid Take 1 tablet (4 mg total) by mouth every 4 (four) hours as needed for severe pain.   lipase/protease/amylase  36000 UNITS Cpep capsule Commonly known as: Creon Take 2 capsules (72,000 Units total) by mouth in the morning, at noon, in the evening, and at bedtime.   loperamide 2 MG capsule Commonly known as: IMODIUM Take 2 capsules (4 mg total) by mouth 3 (three) times daily as needed for diarrhea or loose stools.   meclizine 25 MG tablet Commonly known as: ANTIVERT Take 1 tablet (25 mg total) by mouth 3 (three) times daily as needed for dizziness.   ondansetron 8 MG tablet Commonly known as: ZOFRAN Take 1 tablet (8 mg total) by mouth every 8 (eight) hours as needed for nausea or vomiting.   pantoprazole 40 MG tablet Commonly known as: Protonix Take 1 tablet (40 mg total) by mouth daily.   potassium chloride SA 20 MEQ tablet Commonly known as: KLOR-CON M Take 1 tablet (20 mEq total) by mouth 2  (two) times daily.   sucralfate 1 g tablet Commonly known as: CARAFATE Take 1 tablet (1 g total) by mouth 2 (two) times daily.        Allergies:  Allergies  Allergen Reactions   Lisinopril Swelling and Other (See Comments)    Facial and upper lip    Past Medical History, Surgical history, Social history, and Family History were reviewed and updated.  Review of Systems: Review of Systems  Constitutional: Negative.   HENT: Negative.    Eyes: Negative.   Respiratory: Negative.    Cardiovascular: Negative.   Gastrointestinal: Negative.   Genitourinary: Negative.   Musculoskeletal: Negative.   Skin: Negative.   Neurological: Negative.   Endo/Heme/Allergies: Negative.   Psychiatric/Behavioral: Negative.        Physical Exam:  height is '5\' 3"'$  (1.6 m) and weight is 138 lb 1.3 oz (62.6 kg). Her oral temperature is 97.7 F (36.5 C). Her blood pressure is 132/76 and her pulse is 94. Her respiration is 20 and oxygen saturation is 98%.   Wt Readings from Last 3 Encounters:  06/27/22 138 lb 1.3 oz (62.6 kg)  05/23/22 131 lb 1.9 oz (59.5 kg)  05/02/22 136 lb (61.7 kg)    Physical Exam Vitals reviewed.  HENT:     Head: Normocephalic and atraumatic.  Eyes:     Pupils: Pupils are equal, round, and reactive to light.  Cardiovascular:     Rate and Rhythm: Normal rate and regular rhythm.     Heart sounds: Normal heart sounds.  Pulmonary:     Effort: Pulmonary effort is normal.     Breath sounds: Normal breath sounds.  Abdominal:     General: Bowel sounds are normal.     Palpations: Abdomen is soft.     Comments: Abdominal exam is slightly distended.  There is some tenderness to palpation.  Bowel sounds are decreased.  She has no obvious fluid wave.  There is no obvious abdominal mass.  I cannot palpate her liver or spleen.  Musculoskeletal:        General: No tenderness or deformity. Normal range of motion.     Cervical back: Normal range of motion.     Comments:  Extremities shows decreased range of motion of the left shoulder.  There is decreased abduction.  She has very little rotation of the left shoulder.  Lymphadenopathy:     Cervical: No cervical adenopathy.  Skin:    General: Skin is warm and dry.     Findings: No erythema or rash.  Neurological:     Mental Status: She is alert and oriented  to person, place, and time.  Psychiatric:        Behavior: Behavior normal.        Thought Content: Thought content normal.        Judgment: Judgment normal.     Lab Results  Component Value Date   WBC 4.1 05/30/2022   HGB 9.9 (L) 05/30/2022   HCT 29.6 (L) 05/30/2022   MCV 86.3 05/30/2022   PLT 275 05/30/2022   Lab Results  Component Value Date   FERRITIN 38 03/08/2022   IRON 38 03/08/2022   TIBC 247 (L) 03/08/2022   UIBC NOT CALCULATED 03/08/2022   IRONPCTSAT NOT CALCULATED 03/08/2022   Lab Results  Component Value Date   RETICCTPCT 1.9 03/08/2022   RBC 3.43 (L) 05/30/2022   No results found for: "KPAFRELGTCHN", "LAMBDASER", "KAPLAMBRATIO" No results found for: "IGGSERUM", "IGA", "IGMSERUM" No results found for: "TOTALPROTELP", "ALBUMINELP", "A1GS", "A2GS", "BETS", "BETA2SER", "GAMS", "MSPIKE", "SPEI"   Chemistry      Component Value Date/Time   NA 133 (L) 05/30/2022 0950   K 3.6 05/30/2022 0950   CL 104 05/30/2022 0950   CO2 23 05/30/2022 0950   BUN 5 (L) 05/30/2022 0950   CREATININE 0.83 05/30/2022 0950   CREATININE 0.78 03/23/2018 1018      Component Value Date/Time   CALCIUM 8.7 (L) 05/30/2022 0950   ALKPHOS 120 05/30/2022 0950   AST 24 05/30/2022 0950   ALT 17 05/30/2022 0950   ALT 40 (H) 11/25/2017 0947   BILITOT 0.4 05/30/2022 0950       Impression and Plan: Ms. Eber is a very pleasant 66 yo African American female with adenocarcinoma of the pancreas, ampullary carcinoma.  She received upfront chemotherapy and radiation therapy.  She did not want any surgery.  She now has recurrent disease.  She has a  thrombus in the mesenteric vein and portal vein.  I think she is on Eliquis for this.  We will proceed with chemotherapy with the gemcitabine.  What troubles me is the fact that her albumin is dropping.  Her albumin today is 2.3.  I think this is a ominous prognostic sign for her.  We really would like to get that albumin up to 3 or above.  I told her that the only way that this will happen is if she eats a little better.  I told that she really needs focus on protein and complex carbohydrates.  I know that this may increase her blood sugars but I really do not think that is that much of an issue right now.  After this cycle of chemotherapy, then we will plan for another CT scan to see how everything looks.  Hopefully, we will see that she is responding.  If not, then we may have to really think about getting her onto Hospice.  I really do not see that there is any other treatment that we can give her for this problem.  This is all about quality of life.  I want her to have his good quality of life as possible.  Again, we will give her 1 unit of blood today.  I will plan to see her back after she has her CT scan done.  We will get her back in December.   Volanda Napoleon, MD 11/9/20238:50 AM

## 2022-06-27 NOTE — Patient Instructions (Signed)

## 2022-06-28 ENCOUNTER — Other Ambulatory Visit: Payer: Self-pay | Admitting: *Deleted

## 2022-06-28 LAB — TYPE AND SCREEN
ABO/RH(D): A POS
Antibody Screen: NEGATIVE
Unit division: 0

## 2022-06-28 LAB — BPAM RBC
Blood Product Expiration Date: 202311262359
ISSUE DATE / TIME: 202311091208
Unit Type and Rh: 6200

## 2022-06-28 MED ORDER — DIPHENOXYLATE-ATROPINE 2.5-0.025 MG PO TABS: 2.0000 | ORAL_TABLET | Freq: Four times a day (QID) | ORAL | 0 refills | Status: DC | PRN

## 2022-07-01 ENCOUNTER — Other Ambulatory Visit: Payer: Self-pay

## 2022-07-01 DIAGNOSIS — C251 Malignant neoplasm of body of pancreas: Secondary | ICD-10-CM

## 2022-07-01 MED ORDER — METOLAZONE 2.5 MG PO TABS: 2.5000 mg | ORAL_TABLET | Freq: Every day | ORAL | 3 refills | Status: DC

## 2022-07-02 ENCOUNTER — Ambulatory Visit: Payer: BC Managed Care – PPO | Admitting: Dietician

## 2022-07-02 NOTE — Progress Notes (Signed)
Nutritional Follow Up:  Called patient at mobile#.  She had not yet gotten out of bed.  She said she was going to try the [place] for lunch today and would appreciate if I could call her back later.  She called and said she wasn't feeling well at all and today wasn't a good day for her to try the supplement.  Told her I would be happy to speak with her tomorrow.  Labs: 06/27/22 Na 128, Glucose 168,  Ca 8.4, BUN 15, Albumin 2.3, Hgb 9.3   Anthropometrics:  weight rebounded 7# past month.  previous 49# (26%) past 6 months   Height: 63.5" Weight:  06/27/22  138.1# 05/23/22  131.1# 05/02/22  136# 04/18/22  140.4# 03/08/22  150# 11/27/21  189#   BMI: 24.46  Cyndi Karlen Barbar, RDN, LDN Registered Dietitian, [place] Part Time Remote (Usual office hours: Tuesday-Thursday) Mobile: [phone]

## 2022-07-03 ENCOUNTER — Ambulatory Visit: Payer: BC Managed Care – PPO | Admitting: Dietician

## 2022-07-03 NOTE — Progress Notes (Signed)
Called patient at mobile telephone number to follow up on PO intake, tolerance of samples and NIS as arranged yesterday.  Left message to call.  April Manson, RDN, LDN Registered Dietitian, [place] Part Time Remote (Usual office hours: Tuesday-Thursday) Mobile: [phone] Remote Office: [phone]

## 2022-07-05 ENCOUNTER — Ambulatory Visit (HOSPITAL_BASED_OUTPATIENT_CLINIC_OR_DEPARTMENT_OTHER)
Admission: RE | Admit: 2022-07-05 | Discharge: 2022-07-05 | Disposition: A | Discharge status: Home / Self Care | Payer: BC Managed Care – PPO | Source: Ambulatory Visit | Attending: Hematology & Oncology | Admitting: Hematology & Oncology

## 2022-07-05 ENCOUNTER — Other Ambulatory Visit: Payer: Self-pay | Admitting: Hematology & Oncology

## 2022-07-05 ENCOUNTER — Telehealth: Payer: Self-pay | Admitting: *Deleted

## 2022-07-05 DIAGNOSIS — M7989 Other specified soft tissue disorders: Secondary | ICD-10-CM

## 2022-07-05 NOTE — Telephone Encounter (Signed)
Call received from patient stating that she has new swelling to her left leg and foot along with some redness to the leg. Pt notified that Dr. Marin Olp would like for her to come in today to have a doppler done to check for a blood clot to her leg.  Pt states that she can be here at 1:00PM today for [place].  Appt made per radiology.

## 2022-07-08 ENCOUNTER — Telehealth: Payer: Self-pay

## 2022-07-08 NOTE — Telephone Encounter (Signed)
-----   Message from Volanda Napoleon, MD sent at 07/08/2022 12:04 PM EST ----- Please call and let her know that there is no blood clot in the left leg.  Please find out how she is doing with the leg.  Thanks.  Laurey Arrow

## 2022-07-08 NOTE — Telephone Encounter (Signed)
Advised via MyChart.

## 2022-07-09 ENCOUNTER — Other Ambulatory Visit: Payer: Self-pay

## 2022-07-09 DIAGNOSIS — C251 Malignant neoplasm of body of pancreas: Secondary | ICD-10-CM

## 2022-07-09 NOTE — Telephone Encounter (Signed)
Patient called stating she has been nauseated all day, states she has tried her zofran as prescribed and it isnt helping. States she has no other symptoms. No vomiting, or diarrhea. Informed MD, verbal order for ativan 0.'5mg'$  received and placed and sent to MD to sign. Called and LM with patient with this information and to call back first thing in the AM if she did not feel any better after trying the ativan, and if she worsened over night to go to ED.

## 2022-07-10 MED ORDER — LORAZEPAM 0.5 MG PO TABS: 0.5000 mg | ORAL_TABLET | Freq: Four times a day (QID) | ORAL | 0 refills | Status: DC | PRN

## 2022-07-22 ENCOUNTER — Telehealth: Payer: Self-pay | Admitting: *Deleted

## 2022-07-22 NOTE — Telephone Encounter (Signed)
This nurse called and spoke to patient regarding [facility] agencies. I told her that [facility] is out of network and won't be able to help her. I instructed her to call her insurance company and ask them who they will pay for. She verbalized understanding and said she will get her daughter-in-law to do it.

## 2022-07-24 ENCOUNTER — Ambulatory Visit (HOSPITAL_COMMUNITY)
Admission: RE | Admit: 2022-07-24 | Discharge: 2022-07-24 | Disposition: A | Discharge status: Home / Self Care | Payer: BC Managed Care – PPO | Source: Ambulatory Visit | Attending: Hematology & Oncology | Admitting: Hematology & Oncology

## 2022-07-24 DIAGNOSIS — C251 Malignant neoplasm of body of pancreas: Secondary | ICD-10-CM | POA: Insufficient documentation

## 2022-07-24 MED ORDER — IOHEXOL 300 MG/ML  SOLN
100.0000 mL | Freq: Once | INTRAMUSCULAR | Status: AC | PRN
  Administered 2022-07-24: 100 mL via INTRAVENOUS

## 2022-07-24 MED ORDER — HEPARIN SOD (PORK) LOCK FLUSH 100 UNIT/ML IV SOLN
INTRAVENOUS | Status: AC
  Filled 2022-07-24: qty 5

## 2022-07-24 MED ORDER — HEPARIN SOD (PORK) LOCK FLUSH 100 UNIT/ML IV SOLN
500.0000 [IU] | Freq: Once | INTRAVENOUS | Status: AC
  Administered 2022-07-24: 500 [IU] via INTRAVENOUS

## 2022-07-24 MED ORDER — SODIUM CHLORIDE (PF) 0.9 % IJ SOLN
INTRAMUSCULAR | Status: AC
  Filled 2022-07-24: qty 50

## 2022-07-25 ENCOUNTER — Inpatient Hospital Stay: Payer: BC Managed Care – PPO | Attending: Hematology & Oncology

## 2022-07-25 ENCOUNTER — Other Ambulatory Visit: Payer: Self-pay

## 2022-07-25 ENCOUNTER — Inpatient Hospital Stay (HOSPITAL_BASED_OUTPATIENT_CLINIC_OR_DEPARTMENT_OTHER): Payer: BC Managed Care – PPO | Admitting: Hematology & Oncology

## 2022-07-25 ENCOUNTER — Inpatient Hospital Stay: Payer: BC Managed Care – PPO

## 2022-07-25 ENCOUNTER — Ambulatory Visit: Payer: BC Managed Care – PPO

## 2022-07-25 ENCOUNTER — Encounter: Payer: Self-pay | Admitting: Hematology & Oncology

## 2022-07-25 VITALS — BP 109/62 | HR 80 | Resp 17

## 2022-07-25 VITALS — BP 81/66 | HR 82 | Temp 98.4°F | Resp 17 | Ht 63.0 in | Wt 133.0 lb

## 2022-07-25 DIAGNOSIS — Z452 Encounter for adjustment and management of vascular access device: Secondary | ICD-10-CM | POA: Insufficient documentation

## 2022-07-25 DIAGNOSIS — Z95828 Presence of other vascular implants and grafts: Secondary | ICD-10-CM

## 2022-07-25 DIAGNOSIS — Z7901 Long term (current) use of anticoagulants: Secondary | ICD-10-CM | POA: Insufficient documentation

## 2022-07-25 DIAGNOSIS — R600 Localized edema: Secondary | ICD-10-CM | POA: Diagnosis not present

## 2022-07-25 DIAGNOSIS — E46 Unspecified protein-calorie malnutrition: Secondary | ICD-10-CM

## 2022-07-25 DIAGNOSIS — K55059 Acute (reversible) ischemia of intestine, part and extent unspecified: Secondary | ICD-10-CM | POA: Insufficient documentation

## 2022-07-25 DIAGNOSIS — Z79899 Other long term (current) drug therapy: Secondary | ICD-10-CM | POA: Diagnosis not present

## 2022-07-25 DIAGNOSIS — C251 Malignant neoplasm of body of pancreas: Secondary | ICD-10-CM

## 2022-07-25 DIAGNOSIS — I81 Portal vein thrombosis: Secondary | ICD-10-CM | POA: Insufficient documentation

## 2022-07-25 DIAGNOSIS — E8809 Other disorders of plasma-protein metabolism, not elsewhere classified: Secondary | ICD-10-CM

## 2022-07-25 DIAGNOSIS — C241 Malignant neoplasm of ampulla of Vater: Secondary | ICD-10-CM | POA: Diagnosis not present

## 2022-07-25 LAB — CBC WITH DIFFERENTIAL (CANCER CENTER ONLY)
Abs Immature Granulocytes: 0.05 10*3/uL (ref 0.00–0.07)
Basophils Absolute: 0.1 10*3/uL (ref 0.0–0.1)
Basophils Relative: 1 %
Eosinophils Absolute: 0 10*3/uL (ref 0.0–0.5)
Eosinophils Relative: 0 %
HCT: 28.7 % — ABNORMAL LOW (ref 36.0–46.0)
Hemoglobin: 10.1 g/dL — ABNORMAL LOW (ref 12.0–15.0)
Immature Granulocytes: 0 %
Lymphocytes Relative: 8 %
Lymphs Abs: 1 10*3/uL (ref 0.7–4.0)
MCH: 30.1 pg (ref 26.0–34.0)
MCHC: 35.2 g/dL (ref 30.0–36.0)
MCV: 85.4 fL (ref 80.0–100.0)
Monocytes Absolute: 0.8 10*3/uL (ref 0.1–1.0)
Monocytes Relative: 6 %
Neutro Abs: 10.3 10*3/uL — ABNORMAL HIGH (ref 1.7–7.7)
Neutrophils Relative %: 85 %
Platelet Count: 379 10*3/uL (ref 150–400)
RBC: 3.36 MIL/uL — ABNORMAL LOW (ref 3.87–5.11)
RDW: 17.5 % — ABNORMAL HIGH (ref 11.5–15.5)
WBC Count: 12.2 10*3/uL — ABNORMAL HIGH (ref 4.0–10.5)
nRBC: 0 % (ref 0.0–0.2)

## 2022-07-25 LAB — CMP (CANCER CENTER ONLY)
ALT: 31 U/L (ref 0–44)
AST: 54 U/L — ABNORMAL HIGH (ref 15–41)
Albumin: 2 g/dL — ABNORMAL LOW (ref 3.5–5.0)
Alkaline Phosphatase: 103 U/L (ref 38–126)
Anion gap: 8 (ref 5–15)
BUN: 10 mg/dL (ref 8–23)
CO2: 23 mmol/L (ref 22–32)
Calcium: 7.6 mg/dL — ABNORMAL LOW (ref 8.9–10.3)
Chloride: 102 mmol/L (ref 98–111)
Creatinine: 0.82 mg/dL (ref 0.44–1.00)
GFR, Estimated: >60 mL/min (ref >60)
Glucose, Bld: 77 mg/dL (ref 70–99)
Potassium: 2.8 mmol/L — ABNORMAL LOW (ref 3.5–5.1)
Sodium: 133 mmol/L — ABNORMAL LOW (ref 135–145)
Total Bilirubin: 1.2 mg/dL (ref 0.3–1.2)
Total Protein: 5.9 g/dL — ABNORMAL LOW (ref 6.5–8.1)

## 2022-07-25 LAB — LACTATE DEHYDROGENASE: LDH: 294 U/L — ABNORMAL HIGH (ref 98–192)

## 2022-07-25 LAB — PREALBUMIN: Prealbumin: 5 mg/dL — ABNORMAL LOW (ref 18–38)

## 2022-07-25 MED ORDER — SODIUM CHLORIDE 0.9% FLUSH
10.0000 mL | INTRAVENOUS | Status: DC | PRN
  Administered 2022-07-25: 10 mL via INTRAVENOUS

## 2022-07-25 MED ORDER — HEPARIN SOD (PORK) LOCK FLUSH 100 UNIT/ML IV SOLN
500.0000 [IU] | Freq: Once | INTRAVENOUS | Status: AC
  Administered 2022-07-25: 500 [IU] via INTRAVENOUS

## 2022-07-25 MED ORDER — ALBUMIN HUMAN 25 % IV SOLN
50.0000 g | Freq: Every day | INTRAVENOUS | Status: DC
  Administered 2022-07-25: 50 g via INTRAVENOUS
  Filled 2022-07-25: qty 200

## 2022-07-25 NOTE — Progress Notes (Signed)
Hematology and Oncology Follow Up Visit  Melanie Alvarez [id number] 19-Nov-1955 66 y.o. 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 twice daily-started on 12/24/2021 Abraxane/Gemzar --start cycle #2 on 04/24/2022 -Abraxane dropped on 05/23/2022   Interim History:  Melanie Alvarez is here today for follow-up.  She has been doing okay.  I think the real problem is that she has a very poor nutritional state.  Her albumin is only 2.  Over last checked her prealbumin it was only 5.  She had a CT scan that was done on 07/24/2022.  The CT scan did not show any obvious cancer growth.  However, there was evidence that she has some ascites and some third spacing, which I will suspect is probably from her incredibly low nutritional state.  Again her albumin is only 2.  We really need to boost up her nutritional state before we can do any additional chemotherapy.  At least, I think that we have flexibility that we can hold off on her chemotherapy a little bit.  We are going to adjust her medications.  I would not think she needs as much medications as she had been on.  She is stopped the Norvasc.  She will stop the Zaroxolyn.  Both I think are driving down her blood pressure.  I told her that she must take the Creon.  She is having some diarrhea.  Hopefully, the Creon will help her absorb better.  She has had no problems with nausea or vomiting.  Thankfully, the intestinal stent that she has in place might be helping.  She does have leg swelling.  Again this is all from her low albumin.  Currently, I would have said that her performance status is probably ECOG 2.      Medications:  Allergies as of 07/25/2022       Reactions   Lisinopril Swelling,  Other (See Comments)   Facial and upper lip        Medication List        Accurate as of July 25, 2022  2:49 PM. If you have any questions, ask your nurse or doctor.          ALPRAZolam 1 MG tablet Commonly known as: XANAX Take 0.5 tablets (0.5 mg total) by mouth every 8 (eight) hours as needed for anxiety.   amLODipine 10 MG tablet Commonly known as: NORVASC Take 1 tablet (10 mg total) by mouth at bedtime.   apixaban 5 MG Tabs tablet Commonly known as: Eliquis Take 1 tablet (5 mg total) by mouth 2 (two) times daily.   diphenoxylate-atropine 2.5-0.025 MG tablet Commonly known as: LOMOTIL Take 2 tablets by mouth 4 (four) times daily as needed for diarrhea or loose stools.   gabapentin 400 MG capsule Commonly known as: NEURONTIN Take 1 capsule (400 mg total) by mouth 2 (two) times daily.   glipiZIDE 5 MG 24 hr tablet Commonly known as: GLUCOTROL XL Take 1 tablet (5 mg total) by mouth daily with breakfast.   HYDROmorphone 4 MG tablet Commonly known as: Dilaudid Take 1 tablet (4 mg total) by mouth every 4 (four) hours as needed for severe pain.   lipase/protease/amylase 36000 UNITS Cpep capsule Commonly known as: Creon Take 2 capsules (72,000 Units total)  by mouth in the morning, at noon, in the evening, and at bedtime.   loperamide 2 MG capsule Commonly known as: IMODIUM Take 2 capsules (4 mg total) by mouth 3 (three) times daily as needed for diarrhea or loose stools.   LORazepam 0.5 MG tablet Commonly known as: ATIVAN Take 1 tablet (0.5 mg total) by mouth every 6 (six) hours as needed for anxiety (nausea and vomiting d/t chemotherapy).   meclizine 25 MG tablet Commonly known as: ANTIVERT Take 1 tablet (25 mg total) by mouth 3 (three) times daily as needed for dizziness.   metolazone 2.5 MG tablet Commonly known as: ZAROXOLYN Take 1 tablet (2.5 mg total) by mouth daily.   ondansetron 8 MG tablet Commonly known as: ZOFRAN Take 1 tablet (8 mg total) by  mouth every 8 (eight) hours as needed for nausea or vomiting.   potassium chloride SA 20 MEQ tablet Commonly known as: KLOR-CON M Take 1 tablet (20 mEq total) by mouth 2 (two) times daily.   sucralfate 1 g tablet Commonly known as: CARAFATE Take 1 tablet (1 g total) by mouth 2 (two) times daily.        Allergies:  Allergies  Allergen Reactions   Lisinopril Swelling and Other (See Comments)    Facial and upper lip    Past Medical History, Surgical history, Social history, and Family History were reviewed and updated.  Review of Systems: Review of Systems  Constitutional: Negative.   HENT: Negative.    Eyes: Negative.   Respiratory: Negative.    Cardiovascular: Negative.   Gastrointestinal: Negative.   Genitourinary: Negative.   Musculoskeletal: Negative.   Skin: Negative.   Neurological: Negative.   Endo/Heme/Allergies: Negative.   Psychiatric/Behavioral: Negative.        Physical Exam:  height is '5\' 3"'$  (1.6 m) and weight is 133 lb (60.3 kg). Her oral temperature is 98.4 F (36.9 C). Her blood pressure is 81/66 (abnormal) and her pulse is 82. Her respiration is 17 and oxygen saturation is 100%.   Wt Readings from Last 3 Encounters:  07/25/22 133 lb (60.3 kg)  06/27/22 138 lb 1.3 oz (62.6 kg)  05/23/22 131 lb 1.9 oz (59.5 kg)    Physical Exam Vitals reviewed.  HENT:     Head: Normocephalic and atraumatic.  Eyes:     Pupils: Pupils are equal, round, and reactive to light.  Cardiovascular:     Rate and Rhythm: Normal rate and regular rhythm.     Heart sounds: Normal heart sounds.  Pulmonary:     Effort: Pulmonary effort is normal.     Breath sounds: Normal breath sounds.  Abdominal:     General: Bowel sounds are normal.     Palpations: Abdomen is soft.     Comments: Abdominal exam is slightly distended.  There is some tenderness to palpation.  Bowel sounds are decreased.  She has no obvious fluid wave.  There is no obvious abdominal mass.  I cannot palpate  her liver or spleen.  Musculoskeletal:        General: No tenderness or deformity. Normal range of motion.     Cervical back: Normal range of motion.     Comments: Extremities shows decreased range of motion of the left shoulder.  There is decreased abduction.  She has very little rotation of the left shoulder.  Lymphadenopathy:     Cervical: No cervical adenopathy.  Skin:    General: Skin is warm and dry.     Findings: No  erythema or rash.  Neurological:     Mental Status: She is alert and oriented to person, place, and time.  Psychiatric:        Behavior: Behavior normal.        Thought Content: Thought content normal.        Judgment: Judgment normal.      Lab Results  Component Value Date   WBC 12.2 (H) 07/25/2022   HGB 10.1 (L) 07/25/2022   HCT 28.7 (L) 07/25/2022   MCV 85.4 07/25/2022   PLT 379 07/25/2022   Lab Results  Component Value Date   FERRITIN 38 03/08/2022   IRON 38 03/08/2022   TIBC 247 (L) 03/08/2022   UIBC NOT CALCULATED 03/08/2022   IRONPCTSAT NOT CALCULATED 03/08/2022   Lab Results  Component Value Date   RETICCTPCT 1.9 03/08/2022   RBC 3.36 (L) 07/25/2022   No results found for: "KPAFRELGTCHN", "LAMBDASER", "KAPLAMBRATIO" No results found for: "IGGSERUM", "IGA", "IGMSERUM" No results found for: "TOTALPROTELP", "ALBUMINELP", "A1GS", "A2GS", "BETS", "BETA2SER", "GAMS", "MSPIKE", "SPEI"   Chemistry      Component Value Date/Time   NA 133 (L) 07/25/2022 0831   K 2.8 (L) 07/25/2022 0831   CL 102 07/25/2022 0831   CO2 23 07/25/2022 0831   BUN 10 07/25/2022 0831   CREATININE 0.82 07/25/2022 0831   CREATININE 0.78 03/23/2018 1018      Component Value Date/Time   CALCIUM 7.6 (L) 07/25/2022 0831   ALKPHOS 103 07/25/2022 0831   AST 54 (H) 07/25/2022 0831   ALT 31 07/25/2022 0831   ALT 40 (H) 11/25/2017 0947   BILITOT 1.2 07/25/2022 0831       Impression and Plan: Melanie Alvarez is a very pleasant 66 yo African American female with  adenocarcinoma of the pancreas, ampullary carcinoma.  She received upfront chemotherapy and radiation therapy.  She did not want any surgery.  She now has recurrent disease.  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  I think she is on Eliquis for this.  Again, she has responded to treatment.  We are going to hold on her chemotherapy for right now.  Again we really need to get her quality of life and overall performance status a little better.  I will give her some IV albumin.  I will try to give her this daily for the next few days.  Maybe, this will help dry fluid back into her vessels and then she can urinate them out.  Again, we will drop some of her medications.  I think her blood pressure needs to get a quite a bit higher.  We also have to make sure she takes the Creon.  This will hopefully help her absorb better.  This is clearly a tenuous situation.  There is a lot going on.  We really need to try to improve her nutritional state.  We have to get her albumin above 3.  I think if we can do this, she will feel better without as much swelling.  I will plan to get her back to see [place] in another week or so for follow-up.   Volanda Napoleon, MD 12/7/20232:49 PM

## 2022-07-25 NOTE — Patient Instructions (Signed)

## 2022-07-25 NOTE — Patient Instructions (Signed)
Albumin injection What is this medication? ALBUMIN (al BYOO min) is used to treat or prevent shock following serious injury, bleeding, surgery, or burns by increasing the volume of blood plasma. This medicine can also replace low blood protein. This medicine may be used for other purposes; ask your health care provider or pharmacist if you have questions. This medicine may be used for other purposes; ask your health care provider or pharmacist if you have questions. COMMON BRAND NAME(S): Albuked, Albumarc, Albuminar, Albuminex, AlbuRx, Albutein, Buminate, Flexbumin, Kedbumin, Macrotec, Plasbumin, Plasbumin-20 What should I tell my care team before I take this medication? They need to know if you have any of the following conditions: anemia heart disease kidney disease an unusual or allergic reaction to albumin, other medicines, foods, dyes, or preservatives pregnant or trying to get pregnant breast-feeding How should I use this medication? This medicine is for infusion into a vein. It is given by a health-care professional in a hospital or clinic. Talk to your pediatrician regarding the use of this medicine in children. While this drug may be prescribed for selected conditions, precautions do apply. Overdosage: If you think you have taken too much of this medicine contact a poison control center or emergency room at once. NOTE: This medicine is only for you. Do not share this medicine with others. Overdosage: If you think you have taken too much of this medicine contact a poison control center or emergency room at once. NOTE: This medicine is only for you. Do not share this medicine with others. What if I miss a dose? This does not apply. What may interact with this medication? Interactions are not expected. This list may not describe all possible interactions. Give your health care provider a list of all the medicines, herbs, non-prescription drugs, or dietary supplements you use. Also tell  them if you smoke, drink alcohol, or use illegal drugs. Some items may interact with your medicine. This list may not describe all possible interactions. Give your health care provider a list of all the medicines, herbs, non-prescription drugs, or dietary supplements you use. Also tell them if you smoke, drink alcohol, or use illegal drugs. Some items may interact with your medicine. What should I watch for while using this medication? Your condition will be closely monitored while you receive this medicine. Some products are derived from human plasma, and there is a small risk that these products may contain certain types of virus or bacteria. All products are processed to kill most viruses and bacteria. If you have questions concerning the risk of infections, discuss them with your doctor or health care professional. What side effects may I notice from receiving this medication? Side effects that you should report to your doctor or health care professional as soon as possible: allergic reactions like skin rash, itching or hives, swelling of the face, lips, or tongue breathing problems changes in heartbeat fever, chills pain, redness or swelling at the injection site signs of viral infection including fever, drowsiness, chills, runny nose followed in about 2 weeks by a rash and joint pain tightness in the chest Side effects that usually do not require medical attention (report to your doctor or health care professional if they continue or are bothersome): increased salivation nausea, vomiting This list may not describe all possible side effects. Call your doctor for medical advice about side effects. You may report side effects to FDA at 1-800-FDA-1088. This list may not describe all possible side effects. Call your doctor for medical advice about  side effects. You may report side effects to FDA at 1-800-FDA-1088. Where should I keep my medication? This does not apply. You will not be given this  medicine to store at home. NOTE: This sheet is a summary. It may not cover all possible information. If you have questions about this medicine, talk to your doctor, pharmacist, or health care provider.  2023 Elsevier/Gold Standard (2007-12-25 00:00:00)

## 2022-07-26 ENCOUNTER — Inpatient Hospital Stay: Payer: BC Managed Care – PPO

## 2022-07-26 ENCOUNTER — Ambulatory Visit: Payer: BC Managed Care – PPO | Admitting: Family Medicine

## 2022-07-26 VITALS — BP 118/77 | HR 93 | Temp 98.0°F | Resp 16

## 2022-07-26 DIAGNOSIS — E8809 Other disorders of plasma-protein metabolism, not elsewhere classified: Secondary | ICD-10-CM

## 2022-07-26 DIAGNOSIS — C251 Malignant neoplasm of body of pancreas: Secondary | ICD-10-CM

## 2022-07-26 DIAGNOSIS — C241 Malignant neoplasm of ampulla of Vater: Secondary | ICD-10-CM | POA: Diagnosis not present

## 2022-07-26 MED ORDER — ALBUMIN HUMAN 25 % IV SOLN
50.0000 g | Freq: Every day | INTRAVENOUS | Status: DC
  Administered 2022-07-26: 50 g via INTRAVENOUS
  Filled 2022-07-26: qty 200

## 2022-07-26 MED ORDER — SODIUM CHLORIDE 0.9% FLUSH
10.0000 mL | INTRAVENOUS | Status: AC | PRN
  Administered 2022-07-26: 10 mL

## 2022-07-26 MED ORDER — HEPARIN SOD (PORK) LOCK FLUSH 100 UNIT/ML IV SOLN
500.0000 [IU] | Freq: Every day | INTRAVENOUS | Status: AC | PRN
  Administered 2022-07-26: 500 [IU]

## 2022-07-26 MED ORDER — SODIUM CHLORIDE 0.9 % IV SOLN: INTRAVENOUS | Status: DC

## 2022-07-26 NOTE — Patient Instructions (Signed)
Albumin injection What is this medication? ALBUMIN (al BYOO min) is used to treat or prevent shock following serious injury, bleeding, surgery, or burns by increasing the volume of blood plasma. This medicine can also replace low blood protein. This medicine may be used for other purposes; ask your health care provider or pharmacist if you have questions. This medicine may be used for other purposes; ask your health care provider or pharmacist if you have questions. COMMON BRAND NAME(S): Albuked, Albumarc, Albuminar, Albuminex, AlbuRx, Albutein, Buminate, Flexbumin, Kedbumin, Macrotec, Plasbumin, Plasbumin-20 What should I tell my care team before I take this medication? They need to know if you have any of the following conditions: anemia heart disease kidney disease an unusual or allergic reaction to albumin, other medicines, foods, dyes, or preservatives pregnant or trying to get pregnant breast-feeding How should I use this medication? This medicine is for infusion into a vein. It is given by a health-care professional in a hospital or clinic. Talk to your pediatrician regarding the use of this medicine in children. While this drug may be prescribed for selected conditions, precautions do apply. Overdosage: If you think you have taken too much of this medicine contact a poison control center or emergency room at once. NOTE: This medicine is only for you. Do not share this medicine with others. Overdosage: If you think you have taken too much of this medicine contact a poison control center or emergency room at once. NOTE: This medicine is only for you. Do not share this medicine with others. What if I miss a dose? This does not apply. What may interact with this medication? Interactions are not expected. This list may not describe all possible interactions. Give your health care provider a list of all the medicines, herbs, non-prescription drugs, or dietary supplements you use. Also tell  them if you smoke, drink alcohol, or use illegal drugs. Some items may interact with your medicine. This list may not describe all possible interactions. Give your health care provider a list of all the medicines, herbs, non-prescription drugs, or dietary supplements you use. Also tell them if you smoke, drink alcohol, or use illegal drugs. Some items may interact with your medicine. What should I watch for while using this medication? Your condition will be closely monitored while you receive this medicine. Some products are derived from human plasma, and there is a small risk that these products may contain certain types of virus or bacteria. All products are processed to kill most viruses and bacteria. If you have questions concerning the risk of infections, discuss them with your doctor or health care professional. What side effects may I notice from receiving this medication? Side effects that you should report to your doctor or health care professional as soon as possible: allergic reactions like skin rash, itching or hives, swelling of the face, lips, or tongue breathing problems changes in heartbeat fever, chills pain, redness or swelling at the injection site signs of viral infection including fever, drowsiness, chills, runny nose followed in about 2 weeks by a rash and joint pain tightness in the chest Side effects that usually do not require medical attention (report to your doctor or health care professional if they continue or are bothersome): increased salivation nausea, vomiting This list may not describe all possible side effects. Call your doctor for medical advice about side effects. You may report side effects to FDA at 1-800-FDA-1088. This list may not describe all possible side effects. Call your doctor for medical advice about  side effects. You may report side effects to FDA at 1-800-FDA-1088. Where should I keep my medication? This does not apply. You will not be given this  medicine to store at home. NOTE: This sheet is a summary. It may not cover all possible information. If you have questions about this medicine, talk to your doctor, pharmacist, or health care provider.  2023 Elsevier/Gold Standard (2007-12-25 00:00:00)

## 2022-07-27 LAB — CANCER ANTIGEN 19-9: CA 19-9: 4 U/mL (ref 0–35)

## 2022-07-29 ENCOUNTER — Ambulatory Visit: Payer: BC Managed Care – PPO

## 2022-07-30 ENCOUNTER — Other Ambulatory Visit: Payer: Self-pay | Admitting: Hematology & Oncology

## 2022-07-30 ENCOUNTER — Telehealth: Payer: Self-pay | Admitting: *Deleted

## 2022-07-30 ENCOUNTER — Inpatient Hospital Stay: Payer: BC Managed Care – PPO

## 2022-07-30 VITALS — BP 119/82 | HR 95 | Temp 98.1°F | Resp 18

## 2022-07-30 DIAGNOSIS — E8809 Other disorders of plasma-protein metabolism, not elsewhere classified: Secondary | ICD-10-CM

## 2022-07-30 DIAGNOSIS — D649 Anemia, unspecified: Secondary | ICD-10-CM

## 2022-07-30 DIAGNOSIS — C251 Malignant neoplasm of body of pancreas: Secondary | ICD-10-CM

## 2022-07-30 DIAGNOSIS — Z95828 Presence of other vascular implants and grafts: Secondary | ICD-10-CM

## 2022-07-30 DIAGNOSIS — D638 Anemia in other chronic diseases classified elsewhere: Secondary | ICD-10-CM

## 2022-07-30 DIAGNOSIS — C241 Malignant neoplasm of ampulla of Vater: Secondary | ICD-10-CM | POA: Diagnosis not present

## 2022-07-30 DIAGNOSIS — E46 Unspecified protein-calorie malnutrition: Secondary | ICD-10-CM

## 2022-07-30 DIAGNOSIS — C250 Malignant neoplasm of head of pancreas: Secondary | ICD-10-CM

## 2022-07-30 DIAGNOSIS — R1013 Epigastric pain: Secondary | ICD-10-CM

## 2022-07-30 LAB — CBC WITH DIFFERENTIAL (CANCER CENTER ONLY)
Abs Immature Granulocytes: 0.04 10*3/uL (ref 0.00–0.07)
Basophils Absolute: 0.1 10*3/uL (ref 0.0–0.1)
Basophils Relative: 1 %
Eosinophils Absolute: 0 10*3/uL (ref 0.0–0.5)
Eosinophils Relative: 0 %
HCT: 26.9 % — ABNORMAL LOW (ref 36.0–46.0)
Hemoglobin: 9.4 g/dL — ABNORMAL LOW (ref 12.0–15.0)
Immature Granulocytes: 0 %
Lymphocytes Relative: 11 %
Lymphs Abs: 1.1 10*3/uL (ref 0.7–4.0)
MCH: 30 pg (ref 26.0–34.0)
MCHC: 34.9 g/dL (ref 30.0–36.0)
MCV: 85.9 fL (ref 80.0–100.0)
Monocytes Absolute: 0.4 10*3/uL (ref 0.1–1.0)
Monocytes Relative: 4 %
Neutro Abs: 8.4 10*3/uL — ABNORMAL HIGH (ref 1.7–7.7)
Neutrophils Relative %: 84 %
Platelet Count: 266 10*3/uL (ref 150–400)
RBC: 3.13 MIL/uL — ABNORMAL LOW (ref 3.87–5.11)
RDW: 17.1 % — ABNORMAL HIGH (ref 11.5–15.5)
WBC Count: 10 10*3/uL (ref 4.0–10.5)
nRBC: 0 % (ref 0.0–0.2)

## 2022-07-30 LAB — SAMPLE TO BLOOD BANK

## 2022-07-30 LAB — CMP (CANCER CENTER ONLY)
ALT: 25 U/L (ref 0–44)
AST: 41 U/L (ref 15–41)
Albumin: 3.1 g/dL — ABNORMAL LOW (ref 3.5–5.0)
Alkaline Phosphatase: 75 U/L (ref 38–126)
Anion gap: 9 (ref 5–15)
BUN: 6 mg/dL — ABNORMAL LOW (ref 8–23)
CO2: 24 mmol/L (ref 22–32)
Calcium: 8.3 mg/dL — ABNORMAL LOW (ref 8.9–10.3)
Chloride: 106 mmol/L (ref 98–111)
Creatinine: 0.78 mg/dL (ref 0.44–1.00)
GFR, Estimated: >60 mL/min (ref >60)
Glucose, Bld: 105 mg/dL — ABNORMAL HIGH (ref 70–99)
Potassium: 2.5 mmol/L — CL (ref 3.5–5.1)
Sodium: 139 mmol/L (ref 135–145)
Total Bilirubin: 1.3 mg/dL — ABNORMAL HIGH (ref 0.3–1.2)
Total Protein: 6.1 g/dL — ABNORMAL LOW (ref 6.5–8.1)

## 2022-07-30 LAB — PREPARE RBC (CROSSMATCH)

## 2022-07-30 MED ORDER — SODIUM CHLORIDE 0.9% FLUSH
10.0000 mL | Freq: Once | INTRAVENOUS | Status: AC
  Administered 2022-07-30: 10 mL

## 2022-07-30 MED ORDER — SODIUM CHLORIDE 0.9 % IV SOLN: Freq: Once | INTRAVENOUS | Status: AC

## 2022-07-30 MED ORDER — POTASSIUM CHLORIDE CRYS ER 20 MEQ PO TBCR
40.0000 meq | EXTENDED_RELEASE_TABLET | Freq: Two times a day (BID) | ORAL | Status: DC
  Administered 2022-07-30 (×2): 40 meq via ORAL
  Filled 2022-07-30 (×2): qty 2

## 2022-07-30 MED ORDER — ALBUMIN HUMAN 25 % IV SOLN
50.0000 g | Freq: Every day | INTRAVENOUS | Status: DC
  Administered 2022-07-30: 50 g via INTRAVENOUS
  Filled 2022-07-30: qty 200

## 2022-07-30 MED ORDER — HEPARIN SOD (PORK) LOCK FLUSH 100 UNIT/ML IV SOLN
500.0000 [IU] | Freq: Once | INTRAVENOUS | Status: AC
  Administered 2022-07-30: 500 [IU] via INTRAVENOUS

## 2022-07-30 NOTE — Telephone Encounter (Signed)
Dr. Marin Olp notified of Melanie Alvarez, order received to give pt 40 meq po now and 40 meq po prior to discharge today.  Pt to also continue taking potassium 20 meq PO BID at home per order of Dr. Marin Olp.

## 2022-07-30 NOTE — Patient Instructions (Signed)
Albumin injection What is this medication? ALBUMIN (al BYOO min) is used to treat or prevent shock following serious injury, bleeding, surgery, or burns by increasing the volume of blood plasma. This medicine can also replace low blood protein. This medicine may be used for other purposes; ask your health care provider or pharmacist if you have questions. This medicine may be used for other purposes; ask your health care provider or pharmacist if you have questions. COMMON BRAND NAME(S): Albuked, Albumarc, Albuminar, Albuminex, AlbuRx, Albutein, Buminate, Flexbumin, Kedbumin, Macrotec, Plasbumin, Plasbumin-20 What should I tell my care team before I take this medication? They need to know if you have any of the following conditions: anemia heart disease kidney disease an unusual or allergic reaction to albumin, other medicines, foods, dyes, or preservatives pregnant or trying to get pregnant breast-feeding How should I use this medication? This medicine is for infusion into a vein. It is given by a health-care professional in a hospital or clinic. Talk to your pediatrician regarding the use of this medicine in children. While this drug may be prescribed for selected conditions, precautions do apply. Overdosage: If you think you have taken too much of this medicine contact a poison control center or emergency room at once. NOTE: This medicine is only for you. Do not share this medicine with others. Overdosage: If you think you have taken too much of this medicine contact a poison control center or emergency room at once. NOTE: This medicine is only for you. Do not share this medicine with others. What if I miss a dose? This does not apply. What may interact with this medication? Interactions are not expected. This list may not describe all possible interactions. Give your health care provider a list of all the medicines, herbs, non-prescription drugs, or dietary supplements you use. Also tell  them if you smoke, drink alcohol, or use illegal drugs. Some items may interact with your medicine. This list may not describe all possible interactions. Give your health care provider a list of all the medicines, herbs, non-prescription drugs, or dietary supplements you use. Also tell them if you smoke, drink alcohol, or use illegal drugs. Some items may interact with your medicine. What should I watch for while using this medication? Your condition will be closely monitored while you receive this medicine. Some products are derived from human plasma, and there is a small risk that these products may contain certain types of virus or bacteria. All products are processed to kill most viruses and bacteria. If you have questions concerning the risk of infections, discuss them with your doctor or health care professional. What side effects may I notice from receiving this medication? Side effects that you should report to your doctor or health care professional as soon as possible: allergic reactions like skin rash, itching or hives, swelling of the face, lips, or tongue breathing problems changes in heartbeat fever, chills pain, redness or swelling at the injection site signs of viral infection including fever, drowsiness, chills, runny nose followed in about 2 weeks by a rash and joint pain tightness in the chest Side effects that usually do not require medical attention (report to your doctor or health care professional if they continue or are bothersome): increased salivation nausea, vomiting This list may not describe all possible side effects. Call your doctor for medical advice about side effects. You may report side effects to FDA at 1-800-FDA-1088. This list may not describe all possible side effects. Call your doctor for medical advice about  side effects. You may report side effects to FDA at 1-800-FDA-1088. Where should I keep my medication? This does not apply. You will not be given this  medicine to store at home. NOTE: This sheet is a summary. It may not cover all possible information. If you have questions about this medicine, talk to your doctor, pharmacist, or health care provider.  2023 Elsevier/Gold Standard (2007-12-25 00:00:00)

## 2022-07-31 ENCOUNTER — Other Ambulatory Visit: Payer: Self-pay | Admitting: *Deleted

## 2022-07-31 ENCOUNTER — Other Ambulatory Visit: Payer: Self-pay | Admitting: Family

## 2022-07-31 ENCOUNTER — Inpatient Hospital Stay: Payer: BC Managed Care – PPO | Admitting: Licensed Clinical Social Worker

## 2022-07-31 ENCOUNTER — Inpatient Hospital Stay: Payer: BC Managed Care – PPO

## 2022-07-31 ENCOUNTER — Other Ambulatory Visit: Payer: Self-pay

## 2022-07-31 DIAGNOSIS — D638 Anemia in other chronic diseases classified elsewhere: Secondary | ICD-10-CM

## 2022-07-31 DIAGNOSIS — C251 Malignant neoplasm of body of pancreas: Secondary | ICD-10-CM

## 2022-07-31 DIAGNOSIS — D649 Anemia, unspecified: Secondary | ICD-10-CM

## 2022-07-31 DIAGNOSIS — C241 Malignant neoplasm of ampulla of Vater: Secondary | ICD-10-CM | POA: Diagnosis not present

## 2022-07-31 DIAGNOSIS — E46 Unspecified protein-calorie malnutrition: Secondary | ICD-10-CM

## 2022-07-31 LAB — PREPARE RBC (CROSSMATCH)

## 2022-07-31 MED ORDER — SODIUM CHLORIDE 0.9% IV SOLUTION
250.0000 mL | Freq: Once | INTRAVENOUS | Status: AC
  Administered 2022-07-31: 250 mL via INTRAVENOUS

## 2022-07-31 MED ORDER — ALBUMIN HUMAN 25 % IV SOLN
50.0000 g | Freq: Once | INTRAVENOUS | Status: AC
  Administered 2022-07-31: 50 g via INTRAVENOUS
  Filled 2022-07-31: qty 200

## 2022-07-31 MED ORDER — HEPARIN SOD (PORK) LOCK FLUSH 100 UNIT/ML IV SOLN
500.0000 [IU] | Freq: Every day | INTRAVENOUS | Status: AC | PRN
  Administered 2022-07-31: 500 [IU]

## 2022-07-31 MED ORDER — SODIUM CHLORIDE 0.9% FLUSH
10.0000 mL | INTRAVENOUS | Status: AC | PRN
  Administered 2022-07-31: 10 mL

## 2022-07-31 NOTE — Patient Instructions (Signed)
Albumin injection What is this medication? ALBUMIN (al BYOO min) is used to treat or prevent shock following serious injury, bleeding, surgery, or burns by increasing the volume of blood plasma. This medicine can also replace low blood protein. This medicine may be used for other purposes; ask your health care provider or pharmacist if you have questions. This medicine may be used for other purposes; ask your health care provider or pharmacist if you have questions. COMMON BRAND NAME(S): Albuked, Albumarc, Albuminar, Albuminex, AlbuRx, Albutein, Buminate, Flexbumin, Kedbumin, Macrotec, Plasbumin, Plasbumin-20 What should I tell my care team before I take this medication? They need to know if you have any of the following conditions: anemia heart disease kidney disease an unusual or allergic reaction to albumin, other medicines, foods, dyes, or preservatives pregnant or trying to get pregnant breast-feeding How should I use this medication? This medicine is for infusion into a vein. It is given by a health-care professional in a hospital or clinic. Talk to your pediatrician regarding the use of this medicine in children. While this drug may be prescribed for selected conditions, precautions do apply. Overdosage: If you think you have taken too much of this medicine contact a poison control center or emergency room at once. NOTE: This medicine is only for you. Do not share this medicine with others. Overdosage: If you think you have taken too much of this medicine contact a poison control center or emergency room at once. NOTE: This medicine is only for you. Do not share this medicine with others. What if I miss a dose? This does not apply. What may interact with this medication? Interactions are not expected. This list may not describe all possible interactions. Give your health care provider a list of all the medicines, herbs, non-prescription drugs, or dietary supplements you use. Also tell  them if you smoke, drink alcohol, or use illegal drugs. Some items may interact with your medicine. This list may not describe all possible interactions. Give your health care provider a list of all the medicines, herbs, non-prescription drugs, or dietary supplements you use. Also tell them if you smoke, drink alcohol, or use illegal drugs. Some items may interact with your medicine. What should I watch for while using this medication? Your condition will be closely monitored while you receive this medicine. Some products are derived from human plasma, and there is a small risk that these products may contain certain types of virus or bacteria. All products are processed to kill most viruses and bacteria. If you have questions concerning the risk of infections, discuss them with your doctor or health care professional. What side effects may I notice from receiving this medication? Side effects that you should report to your doctor or health care professional as soon as possible: allergic reactions like skin rash, itching or hives, swelling of the face, lips, or tongue breathing problems changes in heartbeat fever, chills pain, redness or swelling at the injection site signs of viral infection including fever, drowsiness, chills, runny nose followed in about 2 weeks by a rash and joint pain tightness in the chest Side effects that usually do not require medical attention (report to your doctor or health care professional if they continue or are bothersome): increased salivation nausea, vomiting This list may not describe all possible side effects. Call your doctor for medical advice about side effects. You may report side effects to FDA at 1-800-FDA-1088. This list may not describe all possible side effects. Call your doctor for medical advice about  side effects. You may report side effects to FDA at 1-800-FDA-1088. Where should I keep my medication? This does not apply. You will not be given this  medicine to store at home. NOTE: This sheet is a summary. It may not cover all possible information. If you have questions about this medicine, talk to your doctor, pharmacist, or health care provider.  2023 Elsevier/Gold Standard (2007-12-25 00:00:00)

## 2022-07-31 NOTE — Progress Notes (Signed)
[place] [facility]  Clinical Social Work was referred by  dietician  for assessment of psychosocial needs.  Clinical Social Worker met with patient  to offer support and assess for needs.    Patient was receiving her infusion.  She said she fell recently and hurt the side of her face.  She reports giving the staff paperwork so that she can have assistance in her home for four hours a day.  Explored any concerns she might have.  She was not interested in any grants.  She stated she gets her needs met.  Provided patient with CSW contact information and encouraged her to call with any questions or concerns.      Margaree Mackintosh, LCSW  Clinical Social Worker [facility]

## 2022-08-01 LAB — TYPE AND SCREEN
ABO/RH(D): A POS
Antibody Screen: NEGATIVE
Unit division: 0

## 2022-08-01 LAB — BPAM RBC
Blood Product Expiration Date: 202401022359
ISSUE DATE / TIME: 202312130750
Unit Type and Rh: 6200

## 2022-08-05 ENCOUNTER — Inpatient Hospital Stay: Payer: BC Managed Care – PPO

## 2022-08-05 ENCOUNTER — Inpatient Hospital Stay: Payer: BC Managed Care – PPO | Admitting: Hematology & Oncology

## 2022-08-06 ENCOUNTER — Other Ambulatory Visit: Payer: Self-pay

## 2022-08-07 ENCOUNTER — Ambulatory Visit: Payer: BC Managed Care – PPO | Admitting: Gastroenterology

## 2022-08-08 ENCOUNTER — Inpatient Hospital Stay: Payer: BC Managed Care – PPO

## 2022-08-08 ENCOUNTER — Telehealth: Payer: Self-pay | Admitting: *Deleted

## 2022-08-08 ENCOUNTER — Other Ambulatory Visit: Payer: Self-pay

## 2022-08-08 ENCOUNTER — Inpatient Hospital Stay (HOSPITAL_BASED_OUTPATIENT_CLINIC_OR_DEPARTMENT_OTHER): Payer: BC Managed Care – PPO | Admitting: Hematology & Oncology

## 2022-08-08 ENCOUNTER — Encounter: Payer: Self-pay | Admitting: Hematology & Oncology

## 2022-08-08 VITALS — BP 100/76 | HR 114 | Temp 97.7°F | Resp 19 | Ht 63.0 in | Wt 128.0 lb

## 2022-08-08 DIAGNOSIS — C251 Malignant neoplasm of body of pancreas: Secondary | ICD-10-CM

## 2022-08-08 DIAGNOSIS — C241 Malignant neoplasm of ampulla of Vater: Secondary | ICD-10-CM | POA: Diagnosis not present

## 2022-08-08 DIAGNOSIS — E8809 Other disorders of plasma-protein metabolism, not elsewhere classified: Secondary | ICD-10-CM

## 2022-08-08 LAB — CMP (CANCER CENTER ONLY)
ALT: 32 U/L (ref 0–44)
AST: 61 U/L — ABNORMAL HIGH (ref 15–41)
Albumin: 3.2 g/dL — ABNORMAL LOW (ref 3.5–5.0)
Alkaline Phosphatase: 116 U/L (ref 38–126)
Anion gap: 10 (ref 5–15)
BUN: 10 mg/dL (ref 8–23)
CO2: 19 mmol/L — ABNORMAL LOW (ref 22–32)
Calcium: 8.5 mg/dL — ABNORMAL LOW (ref 8.9–10.3)
Chloride: 104 mmol/L (ref 98–111)
Creatinine: 0.77 mg/dL (ref 0.44–1.00)
GFR, Estimated: >60 mL/min (ref >60)
Glucose, Bld: 107 mg/dL — ABNORMAL HIGH (ref 70–99)
Potassium: 4.4 mmol/L (ref 3.5–5.1)
Sodium: 133 mmol/L — ABNORMAL LOW (ref 135–145)
Total Bilirubin: 4.2 mg/dL (ref 0.3–1.2)
Total Protein: 6.1 g/dL — ABNORMAL LOW (ref 6.5–8.1)

## 2022-08-08 LAB — CBC WITH DIFFERENTIAL (CANCER CENTER ONLY)
Abs Immature Granulocytes: 0.1 10*3/uL — ABNORMAL HIGH (ref 0.00–0.07)
Basophils Absolute: 0 10*3/uL (ref 0.0–0.1)
Basophils Relative: 0 %
Eosinophils Absolute: 0.1 10*3/uL (ref 0.0–0.5)
Eosinophils Relative: 0 %
HCT: 32 % — ABNORMAL LOW (ref 36.0–46.0)
Hemoglobin: 11.3 g/dL — ABNORMAL LOW (ref 12.0–15.0)
Immature Granulocytes: 1 %
Lymphocytes Relative: 9 %
Lymphs Abs: 1.1 10*3/uL (ref 0.7–4.0)
MCH: 30.5 pg (ref 26.0–34.0)
MCHC: 35.3 g/dL (ref 30.0–36.0)
MCV: 86.3 fL (ref 80.0–100.0)
Monocytes Absolute: 0.6 10*3/uL (ref 0.1–1.0)
Monocytes Relative: 5 %
Neutro Abs: 9.6 10*3/uL — ABNORMAL HIGH (ref 1.7–7.7)
Neutrophils Relative %: 85 %
Platelet Count: 183 10*3/uL (ref 150–400)
RBC: 3.71 MIL/uL — ABNORMAL LOW (ref 3.87–5.11)
RDW: 17.7 % — ABNORMAL HIGH (ref 11.5–15.5)
WBC Count: 11.4 10*3/uL — ABNORMAL HIGH (ref 4.0–10.5)
nRBC: 0 % (ref 0.0–0.2)

## 2022-08-08 LAB — SAMPLE TO BLOOD BANK

## 2022-08-08 LAB — LACTATE DEHYDROGENASE: LDH: 218 U/L — ABNORMAL HIGH (ref 98–192)

## 2022-08-08 LAB — PREALBUMIN: Prealbumin: <5 mg/dL — ABNORMAL LOW (ref 18–38)

## 2022-08-08 MED ORDER — HEPARIN SOD (PORK) LOCK FLUSH 100 UNIT/ML IV SOLN
500.0000 [IU] | Freq: Once | INTRAVENOUS | Status: AC
  Administered 2022-08-08: 500 [IU] via INTRAVENOUS

## 2022-08-08 MED ORDER — SODIUM CHLORIDE 0.9% FLUSH
10.0000 mL | Freq: Once | INTRAVENOUS | Status: AC
  Administered 2022-08-08: 10 mL

## 2022-08-08 MED ORDER — DIPHENOXYLATE-ATROPINE 2.5-0.025 MG PO TABS: 2.0000 | ORAL_TABLET | Freq: Four times a day (QID) | ORAL | 0 refills | Status: AC | PRN

## 2022-08-08 NOTE — Addendum Note (Signed)
Addended by: Perlie Gold on: 08/08/2022 09:39 AM   Modules accepted: Orders

## 2022-08-08 NOTE — Progress Notes (Signed)
Hematology and Oncology Follow Up Visit  Melanie Alvarez [id number] 1956-06-30 66 y.o. 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 twice daily-started on 12/24/2021 Abraxane/Gemzar --start cycle #2 on 04/24/2022 -Abraxane dropped on 05/23/2022   Interim History:  Melanie Alvarez is here today for follow-up.  Thankfully, the edema in her legs is pretty much gone.  We gave her quite a bit of albumin.  She also got transfused.  Hopefully, we will see that this will continue.  Her albumin is now 3.2.  She is still having problems with diarrhea.  She is not taking the Creon.  I really think she needs to take the Creon.  She is not taking Lomotil.  I think we can probably stop some of her medications.  I do not think she needs diabetic medicines right now.  Will stop the Glucotrol.  She does not complain of any pain.  I am just happy that the edema in her legs is gone.  I want her to be able to enjoy Christmas.  She still is losing some weight.  I think some of the weight that she lost was water weight.  She has had no fever.  She has had no bleeding.  She has had no urinary difficulties.  She has had no headache.  There is been no mouth sores.  Overall, I would have said that her performance status is probably ECOG 3.    Medications:  Allergies as of 08/08/2022       Reactions   Lisinopril Swelling, Other (See Comments)   Facial and upper lip        Medication List        Accurate as of August 08, 2022  9:20 AM. If you have any questions, ask your nurse or doctor.          ALPRAZolam 1 MG tablet Commonly known as: XANAX Take 0.5 tablets (0.5 mg total) by mouth every 8 (eight) hours as needed for anxiety.    apixaban 5 MG Tabs tablet Commonly known as: Eliquis Take 1 tablet (5 mg total) by mouth 2 (two) times daily.   diphenoxylate-atropine 2.5-0.025 MG tablet Commonly known as: LOMOTIL Take 2 tablets by mouth 4 (four) times daily as needed for diarrhea or loose stools.   gabapentin 400 MG capsule Commonly known as: NEURONTIN Take 1 capsule (400 mg total) by mouth 2 (two) times daily.   glipiZIDE 5 MG 24 hr tablet Commonly known as: GLUCOTROL XL Take 1 tablet (5 mg total) by mouth daily with breakfast.   HYDROmorphone 4 MG tablet Commonly known as: Dilaudid Take 1 tablet (4 mg total) by mouth every 4 (four) hours as needed for severe pain.   lipase/protease/amylase 36000 UNITS Cpep capsule Commonly known as: Creon Take 2 capsules (72,000 Units total) by mouth in the morning, at noon, in the evening, and at bedtime.   loperamide 2 MG capsule Commonly known as: IMODIUM Take 2 capsules (4 mg total) by mouth 3 (three) times daily as needed for diarrhea or loose stools.   LORazepam 0.5 MG tablet Commonly known as: ATIVAN Take 1 tablet (0.5 mg total) by mouth every 6 (six) hours as needed for anxiety (nausea and vomiting d/t chemotherapy).   meclizine 25 MG  tablet Commonly known as: ANTIVERT Take 1 tablet (25 mg total) by mouth 3 (three) times daily as needed for dizziness.   ondansetron 8 MG tablet Commonly known as: ZOFRAN Take 1 tablet (8 mg total) by mouth every 8 (eight) hours as needed for nausea or vomiting.   potassium chloride SA 20 MEQ tablet Commonly known as: KLOR-CON M Take 1 tablet (20 mEq total) by mouth 2 (two) times daily.   sucralfate 1 g tablet Commonly known as: CARAFATE Take 1 tablet (1 g total) by mouth 2 (two) times daily.        Allergies:  Allergies  Allergen Reactions   Lisinopril Swelling and Other (See Comments)    Facial and upper lip    Past Medical History, Surgical history, Social history, and Family History were reviewed and  updated.  Review of Systems: Review of Systems  Constitutional: Negative.   HENT: Negative.    Eyes: Negative.   Respiratory: Negative.    Cardiovascular: Negative.   Gastrointestinal: Negative.   Genitourinary: Negative.   Musculoskeletal: Negative.   Skin: Negative.   Neurological: Negative.   Endo/Heme/Allergies: Negative.   Psychiatric/Behavioral: Negative.        Physical Exam:  height is '5\' 3"'$  (1.6 m) and weight is 128 lb (58.1 kg). Her oral temperature is 97.7 F (36.5 C). Her blood pressure is 100/76 and her pulse is 114 (abnormal). Her respiration is 19 and oxygen saturation is 99%.   Wt Readings from Last 3 Encounters:  08/08/22 128 lb (58.1 kg)  07/25/22 133 lb (60.3 kg)  06/27/22 138 lb 1.3 oz (62.6 kg)    Physical Exam Vitals reviewed.  HENT:     Head: Normocephalic and atraumatic.  Eyes:     Pupils: Pupils are equal, round, and reactive to light.  Cardiovascular:     Rate and Rhythm: Normal rate and regular rhythm.     Heart sounds: Normal heart sounds.  Pulmonary:     Effort: Pulmonary effort is normal.     Breath sounds: Normal breath sounds.  Abdominal:     General: Bowel sounds are normal.     Palpations: Abdomen is soft.     Comments: Abdominal exam is slightly distended.  There is some tenderness to palpation.  Bowel sounds are decreased.  She has no obvious fluid wave.  There is no obvious abdominal mass.  I cannot palpate her liver or spleen.  Musculoskeletal:        General: No tenderness or deformity. Normal range of motion.     Cervical back: Normal range of motion.     Comments: Extremities shows decreased range of motion of the left shoulder.  There is decreased abduction.  She has very little rotation of the left shoulder.  Lymphadenopathy:     Cervical: No cervical adenopathy.  Skin:    General: Skin is warm and dry.     Findings: No erythema or rash.  Neurological:     Mental Status: She is alert and oriented to person, place, and  time.  Psychiatric:        Behavior: Behavior normal.        Thought Content: Thought content normal.        Judgment: Judgment normal.      Lab Results  Component Value Date   WBC 11.4 (H) 08/08/2022   HGB 11.3 (L) 08/08/2022   HCT 32.0 (L) 08/08/2022   MCV 86.3 08/08/2022   PLT 183 08/08/2022   Lab Results  Component Value Date   FERRITIN 38 03/08/2022   IRON 38 03/08/2022   TIBC 247 (L) 03/08/2022   UIBC NOT CALCULATED 03/08/2022   IRONPCTSAT NOT CALCULATED 03/08/2022   Lab Results  Component Value Date   RETICCTPCT 1.9 03/08/2022   RBC 3.71 (L) 08/08/2022   No results found for: "KPAFRELGTCHN", "LAMBDASER", "KAPLAMBRATIO" No results found for: "IGGSERUM", "IGA", "IGMSERUM" No results found for: "TOTALPROTELP", "ALBUMINELP", "A1GS", "A2GS", "BETS", "BETA2SER", "GAMS", "MSPIKE", "SPEI"   Chemistry      Component Value Date/Time   NA 133 (L) 08/08/2022 0830   K 4.4 08/08/2022 0830   CL 104 08/08/2022 0830   CO2 19 (L) 08/08/2022 0830   BUN 10 08/08/2022 0830   CREATININE 0.77 08/08/2022 0830   CREATININE 0.78 03/23/2018 1018      Component Value Date/Time   CALCIUM 8.5 (L) 08/08/2022 0830   ALKPHOS 116 08/08/2022 0830   AST 61 (H) 08/08/2022 0830   ALT 32 08/08/2022 0830   ALT 40 (H) 11/25/2017 0947   BILITOT 4.2 ([place]) 08/08/2022 0830       Impression and Plan: Ms. Molloy is a very pleasant 66 yo African American female with adenocarcinoma of the pancreas, ampullary carcinoma.  She received upfront chemotherapy and radiation therapy.  She did not want any surgery.  She now has recurrent disease.  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  I think she is on Eliquis for this.  She last got gemcitabine back in early November.  Again, her performance status was not been that great for [place] to be able to give her treatment.  Again she wants to focus on her quality of life.  Thankfully, her quality of life is better right now.  The edema in her  legs is much better.  Hopefully, she will continue to eat okay.  Maybe, taking the Lomotil, should be able to not have diarrhea.  If she takes the Creon hopefully this will help her.  I would like to see her back in early January.  I would like to get a CT scan when we see her back to see how the cancer looks.  If we do not see any evidence of cancer progression, we will just watch her.  If there is evidence of cancer progression, we will have to see what her performance status is to decide if she needs or could tolerate any therapy.  Again, her quality of life is what she values.  I totally agree with this.  I am happy that she will be able to have a nice Christmas.  I am glad that the edema in her legs has gone down so much.  I knew this would happen if we got her albumin up and forgot her hemoglobin up.  Both have happened.  I am unsure as to why her bilirubin is high.  This may be an element of the albumin that we gave her.  We will just have to watch this.    Volanda Napoleon, MD 12/21/20239:20 AM

## 2022-08-08 NOTE — Patient Instructions (Signed)

## 2022-08-08 NOTE — Telephone Encounter (Signed)
Dr. Marin Olp notified of total bili-4.2.  No new orders received at this time.

## 2022-08-15 ENCOUNTER — Other Ambulatory Visit: Payer: Self-pay

## 2022-08-20 ENCOUNTER — Other Ambulatory Visit: Payer: Self-pay | Admitting: *Deleted

## 2022-08-20 DIAGNOSIS — I81 Portal vein thrombosis: Secondary | ICD-10-CM

## 2022-08-20 MED ORDER — APIXABAN 5 MG PO TABS: 5.0000 mg | ORAL_TABLET | Freq: Two times a day (BID) | ORAL | 2 refills | Status: DC

## 2022-08-29 ENCOUNTER — Other Ambulatory Visit: Payer: Self-pay

## 2022-08-31 ENCOUNTER — Other Ambulatory Visit: Payer: Self-pay

## 2022-09-02 ENCOUNTER — Ambulatory Visit: Payer: BC Managed Care – PPO | Admitting: Hematology & Oncology

## 2022-09-02 ENCOUNTER — Other Ambulatory Visit: Payer: BC Managed Care – PPO

## 2022-09-05 ENCOUNTER — Other Ambulatory Visit: Payer: BC Managed Care – PPO

## 2022-09-05 ENCOUNTER — Encounter (HOSPITAL_BASED_OUTPATIENT_CLINIC_OR_DEPARTMENT_OTHER): Payer: Self-pay | Admitting: Radiology

## 2022-09-05 ENCOUNTER — Ambulatory Visit: Payer: BC Managed Care – PPO | Admitting: Hematology & Oncology

## 2022-09-05 ENCOUNTER — Inpatient Hospital Stay: Payer: BC Managed Care – PPO | Attending: Hematology & Oncology

## 2022-09-05 ENCOUNTER — Inpatient Hospital Stay (HOSPITAL_BASED_OUTPATIENT_CLINIC_OR_DEPARTMENT_OTHER): Payer: BC Managed Care – PPO | Admitting: Hematology & Oncology

## 2022-09-05 ENCOUNTER — Inpatient Hospital Stay: Payer: BC Managed Care – PPO

## 2022-09-05 ENCOUNTER — Other Ambulatory Visit: Payer: Self-pay

## 2022-09-05 ENCOUNTER — Ambulatory Visit (HOSPITAL_BASED_OUTPATIENT_CLINIC_OR_DEPARTMENT_OTHER)
Admission: RE | Admit: 2022-09-05 | Discharge: 2022-09-05 | Disposition: A | Discharge status: Home / Self Care | Payer: BC Managed Care – PPO | Source: Ambulatory Visit | Attending: Hematology & Oncology | Admitting: Hematology & Oncology

## 2022-09-05 VITALS — BP 105/82 | HR 95 | Temp 98.9°F | Resp 18 | Ht 63.0 in | Wt 106.0 lb

## 2022-09-05 DIAGNOSIS — C251 Malignant neoplasm of body of pancreas: Secondary | ICD-10-CM | POA: Insufficient documentation

## 2022-09-05 DIAGNOSIS — C241 Malignant neoplasm of ampulla of Vater: Secondary | ICD-10-CM | POA: Diagnosis not present

## 2022-09-05 DIAGNOSIS — I81 Portal vein thrombosis: Secondary | ICD-10-CM | POA: Insufficient documentation

## 2022-09-05 DIAGNOSIS — Z7901 Long term (current) use of anticoagulants: Secondary | ICD-10-CM | POA: Insufficient documentation

## 2022-09-05 DIAGNOSIS — Z95828 Presence of other vascular implants and grafts: Secondary | ICD-10-CM

## 2022-09-05 DIAGNOSIS — R188 Other ascites: Secondary | ICD-10-CM | POA: Diagnosis not present

## 2022-09-05 LAB — CBC WITH DIFFERENTIAL (CANCER CENTER ONLY)
Abs Immature Granulocytes: 0.06 10*3/uL (ref 0.00–0.07)
Basophils Absolute: 0.1 10*3/uL (ref 0.0–0.1)
Basophils Relative: 0 %
Eosinophils Absolute: 0 10*3/uL (ref 0.0–0.5)
Eosinophils Relative: 0 %
HCT: 27.1 % — ABNORMAL LOW (ref 36.0–46.0)
Hemoglobin: 9.5 g/dL — ABNORMAL LOW (ref 12.0–15.0)
Immature Granulocytes: 0 %
Lymphocytes Relative: 8 %
Lymphs Abs: 1.1 10*3/uL (ref 0.7–4.0)
MCH: 30.5 pg (ref 26.0–34.0)
MCHC: 35.1 g/dL (ref 30.0–36.0)
MCV: 87.1 fL (ref 80.0–100.0)
Monocytes Absolute: 0.5 10*3/uL (ref 0.1–1.0)
Monocytes Relative: 4 %
Neutro Abs: 11.8 10*3/uL — ABNORMAL HIGH (ref 1.7–7.7)
Neutrophils Relative %: 88 %
Platelet Count: 299 10*3/uL (ref 150–400)
RBC: 3.11 MIL/uL — ABNORMAL LOW (ref 3.87–5.11)
RDW: 18.5 % — ABNORMAL HIGH (ref 11.5–15.5)
WBC Count: 13.5 10*3/uL — ABNORMAL HIGH (ref 4.0–10.5)
nRBC: 0 % (ref 0.0–0.2)

## 2022-09-05 LAB — CMP (CANCER CENTER ONLY)
ALT: 43 U/L (ref 0–44)
AST: 72 U/L — ABNORMAL HIGH (ref 15–41)
Albumin: 2.5 g/dL — ABNORMAL LOW (ref 3.5–5.0)
Alkaline Phosphatase: 144 U/L — ABNORMAL HIGH (ref 38–126)
Anion gap: 9 (ref 5–15)
BUN: 8 mg/dL (ref 8–23)
CO2: 21 mmol/L — ABNORMAL LOW (ref 22–32)
Calcium: 8.1 mg/dL — ABNORMAL LOW (ref 8.9–10.3)
Chloride: 95 mmol/L — ABNORMAL LOW (ref 98–111)
Creatinine: 0.61 mg/dL (ref 0.44–1.00)
GFR, Estimated: >60 mL/min (ref >60)
Glucose, Bld: 120 mg/dL — ABNORMAL HIGH (ref 70–99)
Potassium: 3.9 mmol/L (ref 3.5–5.1)
Sodium: 125 mmol/L — ABNORMAL LOW (ref 135–145)
Total Bilirubin: 5.5 mg/dL (ref 0.3–1.2)
Total Protein: 6.8 g/dL (ref 6.5–8.1)

## 2022-09-05 LAB — SAMPLE TO BLOOD BANK

## 2022-09-05 LAB — LACTATE DEHYDROGENASE: LDH: 199 U/L — ABNORMAL HIGH (ref 98–192)

## 2022-09-05 MED ORDER — SODIUM CHLORIDE 0.9% FLUSH
10.0000 mL | Freq: Once | INTRAVENOUS | Status: AC
  Administered 2022-09-05: 10 mL via INTRAVENOUS

## 2022-09-05 MED ORDER — IOHEXOL 300 MG/ML  SOLN
100.0000 mL | Freq: Once | INTRAMUSCULAR | Status: AC | PRN
  Administered 2022-09-05: 100 mL via INTRAVENOUS

## 2022-09-05 MED ORDER — HEPARIN SOD (PORK) LOCK FLUSH 100 UNIT/ML IV SOLN
500.0000 [IU] | Freq: Once | INTRAVENOUS | Status: AC
  Administered 2022-09-05: 500 [IU] via INTRAVENOUS

## 2022-09-05 NOTE — Patient Instructions (Signed)

## 2022-09-05 NOTE — Progress Notes (Signed)
Hematology and Oncology Follow Up Visit  Melanie Alvarez [id number] 07/29/1956 67 y.o. 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 twice daily-started on 12/24/2021 Abraxane/Gemzar --start cycle #2 on 04/24/2022 -Abraxane dropped on 05/23/2022   Interim History:  Melanie Alvarez is here today for follow-up.  She really has lost a lot of weight.  She had a CT scan that was done today.  I think the CT scan clearly shows this that her malignancy is progressing.  She has worsening adenopathy.  I suspect that she probably does have disease in her liver now.  She has ascites.  The weight loss is incredibly severe.  She now is 106 pounds.  She also has biliary obstruction.  Her total bilirubin is 5.5.  Her albumin is down to 2.5.  I think that everything is clearly pointing to her transitioning over to a end of life phase.  She wants to be at home.  I told her that she is not a candidate for any further therapy.  I just think that therapy would do more harm than good at this point.  She is always been very independent.  She is always been very definite as to how she would want to be treated.  I told her that this is the time that we need to get Hospice involved.  I explained to her how hospice works.  I told her that Hospice will come out to her house and that they could help keep her at home and help her family.  She agrees to hospice.  She is not eating much.  I think it is evident by her weight loss.  She is not having any pain.  She is having some diarrhea.  She is not having any fever.  She is having no bleeding.  She thinks that she needs blood.  I told her that her blood count is not that bad.  I think the fatigue that she has is  clearly from her underlying malignancy that is progressing.  I just hate the fact that she has declined so quickly.  However, this is the natural history of pancreatic cancer.  Thankfully, she does not have any obstruction so she is still able to eat a little bit.  I did talk to her about end-of-life issues.  She does not want to be kept alive on a machine.  I told her that if she were to be kept alive on a breathing machine, that she would never come off it and that it would be up to her family to take her off.  She does not wish to have this happen.  As such, she is a DO NOT RESUSCITATE.  We will get Hospice involved.  We will call them.  They will call Melanie Alvarez and set up an appointment to see her.  Currently, I would have to say that her performance status is probably ECOG 3.     Medications:  Allergies as of 09/05/2022       Reactions   Lisinopril Swelling, Other (See Comments)   Facial and upper lip        Medication List        Accurate as of September 05, 2022  5:05 PM. If you have  any questions, ask your nurse or doctor.          ALPRAZolam 1 MG tablet Commonly known as: XANAX Take 0.5 tablets (0.5 mg total) by mouth every 8 (eight) hours as needed for anxiety.   apixaban 5 MG Tabs tablet Commonly known as: Eliquis Take 1 tablet (5 mg total) by mouth 2 (two) times daily.   diphenoxylate-atropine 2.5-0.025 MG tablet Commonly known as: LOMOTIL Take 2 tablets by mouth 4 (four) times daily as needed for diarrhea or loose stools.   gabapentin 400 MG capsule Commonly known as: NEURONTIN Take 1 capsule (400 mg total) by mouth 2 (two) times daily.   HYDROmorphone 4 MG tablet Commonly known as: Dilaudid Take 1 tablet (4 mg total) by mouth every 4 (four) hours as needed for severe pain.   lipase/protease/amylase 36000 UNITS Cpep capsule Commonly known as: Creon Take 2 capsules (72,000 Units total) by mouth in the morning, at noon, in the evening, and at  bedtime.   loperamide 2 MG capsule Commonly known as: IMODIUM Take 2 capsules (4 mg total) by mouth 3 (three) times daily as needed for diarrhea or loose stools.   LORazepam 0.5 MG tablet Commonly known as: ATIVAN Take 1 tablet (0.5 mg total) by mouth every 6 (six) hours as needed for anxiety (nausea and vomiting d/t chemotherapy).   meclizine 25 MG tablet Commonly known as: ANTIVERT Take 1 tablet (25 mg total) by mouth 3 (three) times daily as needed for dizziness.   ondansetron 8 MG tablet Commonly known as: ZOFRAN Take 1 tablet (8 mg total) by mouth every 8 (eight) hours as needed for nausea or vomiting.   potassium chloride SA 20 MEQ tablet Commonly known as: KLOR-CON M Take 1 tablet (20 mEq total) by mouth 2 (two) times daily.        Allergies:  Allergies  Allergen Reactions   Lisinopril Swelling and Other (See Comments)    Facial and upper lip    Past Medical History, Surgical history, Social history, and Family History were reviewed and updated.  Review of Systems: Review of Systems  Constitutional: Negative.   HENT: Negative.    Eyes: Negative.   Respiratory: Negative.    Cardiovascular: Negative.   Gastrointestinal: Negative.   Genitourinary: Negative.   Musculoskeletal: Negative.   Skin: Negative.   Neurological: Negative.   Endo/Heme/Allergies: Negative.   Psychiatric/Behavioral: Negative.        Physical Exam:  height is '5\' 3"'$  (1.6 m) and weight is 106 lb (48.1 kg). Her oral temperature is 98.9 F (37.2 C). Her blood pressure is 105/82 and her pulse is 95. Her respiration is 18 and oxygen saturation is 100%.   Wt Readings from Last 3 Encounters:  09/05/22 106 lb (48.1 kg)  08/08/22 128 lb (58.1 kg)  07/25/22 133 lb (60.3 kg)    Physical Exam Vitals reviewed.  Constitutional:      Comments: Thin, chronically ill-appearing African-American female.  She looks a little bit dehydrated.  She does have some scleral icterus.  There is some palatal  icterus.  HENT:     Head: Normocephalic and atraumatic.  Eyes:     General: Scleral icterus present.     Pupils: Pupils are equal, round, and reactive to light.  Cardiovascular:     Rate and Rhythm: Normal rate and regular rhythm.     Heart sounds: Normal heart sounds.  Pulmonary:     Effort: Pulmonary effort is normal.     Breath sounds: Normal  breath sounds.  Abdominal:     General: Bowel sounds are normal.     Palpations: Abdomen is soft.     Comments: Abdominal exam is slightly distended.  There is some tenderness to palpation.  Bowel sounds are decreased.  She has no obvious fluid wave.  There is no obvious abdominal mass.  I cannot palpate her liver or spleen.  Musculoskeletal:        General: No tenderness or deformity. Normal range of motion.     Cervical back: Normal range of motion.     Comments: Extremities shows symmetric muscle atrophy in upper and lower extremities.  She still has the decreased range of motion of the left shoulder.   Lymphadenopathy:     Cervical: No cervical adenopathy.  Skin:    General: Skin is warm and dry.     Coloration: Skin is jaundiced.     Findings: No erythema or rash.  Neurological:     Mental Status: She is alert and oriented to person, place, and time.  Psychiatric:        Behavior: Behavior normal.        Thought Content: Thought content normal.        Judgment: Judgment normal.      Lab Results  Component Value Date   WBC 13.5 (H) 09/05/2022   HGB 9.5 (L) 09/05/2022   HCT 27.1 (L) 09/05/2022   MCV 87.1 09/05/2022   PLT 299 09/05/2022   Lab Results  Component Value Date   FERRITIN 38 03/08/2022   IRON 38 03/08/2022   TIBC 247 (L) 03/08/2022   UIBC NOT CALCULATED 03/08/2022   IRONPCTSAT NOT CALCULATED 03/08/2022   Lab Results  Component Value Date   RETICCTPCT 1.9 03/08/2022   RBC 3.11 (L) 09/05/2022   No results found for: "KPAFRELGTCHN", "LAMBDASER", "KAPLAMBRATIO" No results found for: "IGGSERUM", "IGA",  "IGMSERUM" No results found for: "TOTALPROTELP", "ALBUMINELP", "A1GS", "A2GS", "BETS", "BETA2SER", "GAMS", "MSPIKE", "SPEI"   Chemistry      Component Value Date/Time   NA 125 (L) 09/05/2022 1443   K 3.9 09/05/2022 1443   CL 95 (L) 09/05/2022 1443   CO2 21 (L) 09/05/2022 1443   BUN 8 09/05/2022 1443   CREATININE 0.61 09/05/2022 1443   CREATININE 0.78 03/23/2018 1018      Component Value Date/Time   CALCIUM 8.1 (L) 09/05/2022 1443   ALKPHOS 144 (H) 09/05/2022 1443   AST 72 (H) 09/05/2022 1443   ALT 43 09/05/2022 1443   ALT 40 (H) 11/25/2017 0947   BILITOT 5.5 ([place]) 09/05/2022 1443       Impression and Plan: Ms. Christians is a very pleasant 67 yo African American female with adenocarcinoma of the pancreas, ampullary carcinoma.  She received upfront chemotherapy and radiation therapy.  She did not want any surgery.  We tried to treat her when her cancer came back.  She just had a very tough time with treatment.  At this point, we now organ to focus on her quality of life.  She deserves comfort, dignity, and respect.  I know that Hospice will be able to help [place] out.  I did not talk to her as to how long I thought she had.  She has a strong faith.  She is going to put this in God's hands and know that He will take her when He is ready.  Again, I just want her to have comfort.  I know that Hospice will do a wonderful job and trying  to make this happen.  It has been truly a pleasure to have been able to help Ms. Piehl.  She has been incredibly tough.  She has been very focused on what she would like to do and not do.  She is comfortable going home and passing away at home.  I know that my nurses will miss her yearly.  They really enjoyed her.  They enjoyed the camaraderie that they had with her and the fact that they could mess around with her and she would mess right back with them.    Volanda Napoleon, MD 1/18/20245:05 PM

## 2022-09-06 ENCOUNTER — Telehealth: Payer: Self-pay | Admitting: *Deleted

## 2022-09-06 LAB — CANCER ANTIGEN 19-9: CA 19-9: 6 U/mL (ref 0–35)

## 2022-09-06 NOTE — Telephone Encounter (Signed)
Per Dr. Marin Olp, please enroll patient with Hospice. [facility] called and information given to them. They will call the patient today and have her enrolled by tomorrow.

## 2022-09-12 ENCOUNTER — Telehealth: Payer: Self-pay | Admitting: *Deleted

## 2022-09-12 ENCOUNTER — Other Ambulatory Visit: Payer: Self-pay | Admitting: *Deleted

## 2022-09-12 DIAGNOSIS — C251 Malignant neoplasm of body of pancreas: Secondary | ICD-10-CM

## 2022-09-12 MED ORDER — LORAZEPAM 0.5 MG PO TABS: 0.5000 mg | ORAL_TABLET | Freq: Four times a day (QID) | ORAL | 0 refills | Status: AC | PRN

## 2022-09-12 NOTE — Telephone Encounter (Signed)
Call received from patient requesting an anti-emetic that she can take more often than every 8 hours.  Call placed back to patient and patient notified to take the Ativan as prescribed for N/V per order of Dr. Marin Olp.  Pt states that she does not have Ativan at home.  Re-fill request sent to Dr. Marin Olp.  Pt instructed that prescription for Ativan would be sent to the [facility] on [address] per Dr. Marin Olp.  Pt is appreciative of assistance and has no further questions at this time.

## 2022-09-23 ENCOUNTER — Telehealth: Payer: Self-pay

## 2022-10-18 NOTE — Telephone Encounter (Signed)
Fax received from hospice, patient passed 30-Sep-2022 at 0726.MD aware

## 2022-10-18 DEATH — deceased
